# Patient Record
Sex: Female | Born: 1935 | Race: White | Hispanic: No | Smoking: Former smoker
Health system: Southern US, Community
[De-identification: ages and names within clinical notes are randomized; demographics above are authoritative.]

## PROBLEM LIST (undated history)

## (undated) DIAGNOSIS — J449 Chronic obstructive pulmonary disease, unspecified: Secondary | ICD-10-CM

## (undated) DIAGNOSIS — Z954 Presence of other heart-valve replacement: Secondary | ICD-10-CM

## (undated) DIAGNOSIS — E559 Vitamin D deficiency, unspecified: Secondary | ICD-10-CM

## (undated) DIAGNOSIS — Z9981 Dependence on supplemental oxygen: Secondary | ICD-10-CM

## (undated) DIAGNOSIS — I6529 Occlusion and stenosis of unspecified carotid artery: Secondary | ICD-10-CM

## (undated) DIAGNOSIS — H269 Unspecified cataract: Secondary | ICD-10-CM

## (undated) DIAGNOSIS — I10 Essential (primary) hypertension: Secondary | ICD-10-CM

## (undated) DIAGNOSIS — D649 Anemia, unspecified: Secondary | ICD-10-CM

## (undated) DIAGNOSIS — E7800 Pure hypercholesterolemia, unspecified: Secondary | ICD-10-CM

## (undated) DIAGNOSIS — I447 Left bundle-branch block, unspecified: Secondary | ICD-10-CM

## (undated) DIAGNOSIS — M1990 Unspecified osteoarthritis, unspecified site: Secondary | ICD-10-CM

## (undated) DIAGNOSIS — Z951 Presence of aortocoronary bypass graft: Secondary | ICD-10-CM

## (undated) DIAGNOSIS — G609 Hereditary and idiopathic neuropathy, unspecified: Secondary | ICD-10-CM

## (undated) DIAGNOSIS — G454 Transient global amnesia: Secondary | ICD-10-CM

## (undated) DIAGNOSIS — I5042 Chronic combined systolic (congestive) and diastolic (congestive) heart failure: Secondary | ICD-10-CM

## (undated) DIAGNOSIS — H3530 Unspecified macular degeneration: Secondary | ICD-10-CM

## (undated) DIAGNOSIS — I639 Cerebral infarction, unspecified: Secondary | ICD-10-CM

## (undated) DIAGNOSIS — Z5189 Encounter for other specified aftercare: Secondary | ICD-10-CM

## (undated) DIAGNOSIS — F419 Anxiety disorder, unspecified: Secondary | ICD-10-CM

## (undated) DIAGNOSIS — I2510 Atherosclerotic heart disease of native coronary artery without angina pectoris: Secondary | ICD-10-CM

## (undated) HISTORY — DX: Unspecified cataract: H26.9

## (undated) HISTORY — PX: CORONARY ARTERY BYPASS GRAFT: SHX141

## (undated) HISTORY — PX: APPENDECTOMY: SHX54

## (undated) HISTORY — DX: Unspecified macular degeneration: H35.30

## (undated) HISTORY — DX: Anxiety disorder, unspecified: F41.9

## (undated) HISTORY — DX: Unspecified osteoarthritis, unspecified site: M19.90

## (undated) HISTORY — DX: Encounter for other specified aftercare: Z51.89

## (undated) HISTORY — DX: Cerebral infarction, unspecified: I63.9

## (undated) HISTORY — DX: Essential (primary) hypertension: I10

## (undated) HISTORY — DX: Occlusion and stenosis of unspecified carotid artery: I65.29

## (undated) HISTORY — PX: AORTIC VALVE REPLACEMENT: SHX41

## (undated) HISTORY — DX: Presence of other heart-valve replacement: Z95.4

## (undated) HISTORY — DX: Left bundle-branch block, unspecified: I44.7

## (undated) HISTORY — DX: Anemia, unspecified: D64.9

## (undated) HISTORY — DX: Pure hypercholesterolemia, unspecified: E78.00

## (undated) HISTORY — DX: Presence of aortocoronary bypass graft: Z95.1

## (undated) HISTORY — DX: Atherosclerotic heart disease of native coronary artery without angina pectoris: I25.10

## (undated) HISTORY — DX: Transient global amnesia: G45.4

## (undated) HISTORY — DX: Vitamin D deficiency, unspecified: E55.9

## (undated) HISTORY — PX: TONSILLECTOMY: SUR1361

## (undated) HISTORY — DX: Hereditary and idiopathic neuropathy, unspecified: G60.9

## (undated) NOTE — *Deleted (*Deleted)
D/C home with dtr, d/c instructions provided. Questions were answered. Very appreciative of the care. Left all belongings.

---

## 2001-05-16 ENCOUNTER — Ambulatory Visit (HOSPITAL_COMMUNITY): Admission: RE | Admit: 2001-05-16 | Discharge: 2001-05-16 | Payer: Self-pay | Admitting: Cardiology

## 2001-10-20 ENCOUNTER — Ambulatory Visit (HOSPITAL_COMMUNITY): Admission: RE | Admit: 2001-10-20 | Discharge: 2001-10-20 | Payer: Self-pay | Admitting: Pulmonary Disease

## 2001-11-28 ENCOUNTER — Ambulatory Visit (HOSPITAL_COMMUNITY): Admission: RE | Admit: 2001-11-28 | Discharge: 2001-11-28 | Payer: Self-pay | Admitting: Ophthalmology

## 2002-01-23 ENCOUNTER — Ambulatory Visit (HOSPITAL_COMMUNITY): Admission: RE | Admit: 2002-01-23 | Discharge: 2002-01-23 | Payer: Self-pay | Admitting: Ophthalmology

## 2002-01-29 ENCOUNTER — Other Ambulatory Visit: Admission: RE | Admit: 2002-01-29 | Discharge: 2002-01-29 | Payer: Self-pay | Admitting: *Deleted

## 2002-05-08 ENCOUNTER — Ambulatory Visit (HOSPITAL_COMMUNITY): Admission: RE | Admit: 2002-05-08 | Discharge: 2002-05-08 | Payer: Self-pay | Admitting: Cardiology

## 2002-06-14 ENCOUNTER — Ambulatory Visit (HOSPITAL_COMMUNITY): Admission: RE | Admit: 2002-06-14 | Discharge: 2002-06-14 | Payer: Self-pay | Admitting: Cardiology

## 2002-10-30 ENCOUNTER — Ambulatory Visit (HOSPITAL_COMMUNITY): Admission: RE | Admit: 2002-10-30 | Discharge: 2002-10-30 | Payer: Self-pay | Admitting: Pulmonary Disease

## 2003-02-01 ENCOUNTER — Other Ambulatory Visit: Admission: RE | Admit: 2003-02-01 | Discharge: 2003-02-01 | Payer: Self-pay | Admitting: *Deleted

## 2003-06-07 ENCOUNTER — Ambulatory Visit (HOSPITAL_COMMUNITY): Admission: RE | Admit: 2003-06-07 | Discharge: 2003-06-07 | Payer: Self-pay | Admitting: Cardiology

## 2003-11-01 ENCOUNTER — Ambulatory Visit (HOSPITAL_COMMUNITY): Admission: RE | Admit: 2003-11-01 | Discharge: 2003-11-01 | Payer: Self-pay | Admitting: Pulmonary Disease

## 2004-09-21 ENCOUNTER — Ambulatory Visit: Payer: Self-pay | Admitting: *Deleted

## 2004-10-13 ENCOUNTER — Ambulatory Visit: Payer: Self-pay | Admitting: *Deleted

## 2004-11-03 ENCOUNTER — Ambulatory Visit: Payer: Self-pay | Admitting: Cardiology

## 2004-11-24 ENCOUNTER — Ambulatory Visit (HOSPITAL_COMMUNITY): Admission: RE | Admit: 2004-11-24 | Discharge: 2004-11-24 | Payer: Self-pay | Admitting: Pulmonary Disease

## 2004-12-02 ENCOUNTER — Ambulatory Visit: Payer: Self-pay | Admitting: Cardiology

## 2004-12-09 ENCOUNTER — Ambulatory Visit: Payer: Self-pay | Admitting: Cardiology

## 2004-12-15 ENCOUNTER — Ambulatory Visit: Payer: Self-pay | Admitting: Internal Medicine

## 2004-12-29 ENCOUNTER — Ambulatory Visit: Payer: Self-pay | Admitting: Cardiology

## 2005-01-05 ENCOUNTER — Ambulatory Visit: Payer: Self-pay | Admitting: *Deleted

## 2005-01-26 ENCOUNTER — Ambulatory Visit: Payer: Self-pay | Admitting: Cardiology

## 2005-02-02 ENCOUNTER — Ambulatory Visit: Payer: Self-pay | Admitting: Cardiology

## 2005-02-09 ENCOUNTER — Ambulatory Visit: Payer: Self-pay | Admitting: Cardiology

## 2005-02-23 ENCOUNTER — Ambulatory Visit: Payer: Self-pay | Admitting: Cardiology

## 2005-03-09 ENCOUNTER — Ambulatory Visit: Payer: Self-pay | Admitting: *Deleted

## 2005-03-10 ENCOUNTER — Other Ambulatory Visit: Admission: RE | Admit: 2005-03-10 | Discharge: 2005-03-10 | Payer: Self-pay | Admitting: *Deleted

## 2005-03-30 ENCOUNTER — Ambulatory Visit: Payer: Self-pay | Admitting: Cardiology

## 2005-04-06 ENCOUNTER — Ambulatory Visit: Payer: Self-pay | Admitting: Cardiology

## 2005-04-14 ENCOUNTER — Ambulatory Visit: Payer: Self-pay | Admitting: Cardiology

## 2005-05-04 ENCOUNTER — Ambulatory Visit: Payer: Self-pay | Admitting: Cardiology

## 2005-05-19 ENCOUNTER — Ambulatory Visit: Payer: Self-pay

## 2005-06-01 ENCOUNTER — Ambulatory Visit: Payer: Self-pay | Admitting: Cardiology

## 2005-06-15 ENCOUNTER — Ambulatory Visit: Payer: Self-pay | Admitting: Internal Medicine

## 2005-06-29 ENCOUNTER — Ambulatory Visit: Payer: Self-pay | Admitting: Cardiology

## 2005-07-13 ENCOUNTER — Ambulatory Visit: Payer: Self-pay | Admitting: Cardiology

## 2005-08-03 ENCOUNTER — Ambulatory Visit: Payer: Self-pay | Admitting: Cardiology

## 2005-08-24 ENCOUNTER — Ambulatory Visit: Payer: Self-pay | Admitting: Cardiology

## 2005-09-14 ENCOUNTER — Ambulatory Visit: Payer: Self-pay | Admitting: Cardiology

## 2005-10-12 ENCOUNTER — Ambulatory Visit: Payer: Self-pay | Admitting: Cardiology

## 2005-10-19 ENCOUNTER — Ambulatory Visit: Payer: Self-pay | Admitting: Cardiology

## 2005-11-16 ENCOUNTER — Ambulatory Visit: Payer: Self-pay | Admitting: Cardiology

## 2005-12-14 ENCOUNTER — Ambulatory Visit: Payer: Self-pay | Admitting: Cardiology

## 2005-12-20 ENCOUNTER — Ambulatory Visit (HOSPITAL_COMMUNITY): Admission: RE | Admit: 2005-12-20 | Discharge: 2005-12-20 | Payer: Self-pay | Admitting: Pulmonary Disease

## 2006-01-11 ENCOUNTER — Ambulatory Visit: Payer: Self-pay | Admitting: *Deleted

## 2006-01-18 ENCOUNTER — Ambulatory Visit: Payer: Self-pay | Admitting: *Deleted

## 2006-01-21 ENCOUNTER — Ambulatory Visit (HOSPITAL_COMMUNITY): Payer: Self-pay | Admitting: Orthopaedic Surgery

## 2006-01-21 ENCOUNTER — Encounter (HOSPITAL_COMMUNITY): Admission: RE | Admit: 2006-01-21 | Discharge: 2006-02-20 | Payer: Self-pay | Admitting: Orthopaedic Surgery

## 2006-01-24 ENCOUNTER — Ambulatory Visit: Payer: Self-pay | Admitting: Cardiology

## 2006-02-07 ENCOUNTER — Ambulatory Visit: Payer: Self-pay | Admitting: Internal Medicine

## 2006-02-16 ENCOUNTER — Ambulatory Visit: Payer: Self-pay

## 2006-03-02 ENCOUNTER — Ambulatory Visit: Payer: Self-pay | Admitting: *Deleted

## 2006-03-23 ENCOUNTER — Ambulatory Visit: Payer: Self-pay | Admitting: Cardiology

## 2006-04-05 ENCOUNTER — Ambulatory Visit: Payer: Self-pay | Admitting: *Deleted

## 2006-04-19 ENCOUNTER — Ambulatory Visit: Payer: Self-pay | Admitting: Internal Medicine

## 2006-05-04 ENCOUNTER — Ambulatory Visit: Payer: Self-pay | Admitting: *Deleted

## 2006-05-19 ENCOUNTER — Ambulatory Visit: Payer: Self-pay | Admitting: Cardiology

## 2006-05-25 ENCOUNTER — Ambulatory Visit: Payer: Self-pay | Admitting: Internal Medicine

## 2006-05-25 ENCOUNTER — Ambulatory Visit: Payer: Self-pay | Admitting: Cardiology

## 2006-06-08 ENCOUNTER — Ambulatory Visit: Payer: Self-pay | Admitting: Cardiology

## 2006-06-15 ENCOUNTER — Ambulatory Visit: Payer: Self-pay

## 2006-06-15 ENCOUNTER — Encounter: Payer: Self-pay | Admitting: Cardiology

## 2006-07-06 ENCOUNTER — Ambulatory Visit: Payer: Self-pay | Admitting: Cardiology

## 2006-08-02 ENCOUNTER — Ambulatory Visit: Payer: Self-pay | Admitting: Cardiology

## 2006-08-24 ENCOUNTER — Emergency Department (HOSPITAL_COMMUNITY): Admission: EM | Admit: 2006-08-24 | Discharge: 2006-08-24 | Payer: Self-pay | Admitting: Emergency Medicine

## 2006-08-30 ENCOUNTER — Ambulatory Visit: Payer: Self-pay | Admitting: Cardiology

## 2006-09-13 ENCOUNTER — Ambulatory Visit: Payer: Self-pay | Admitting: Cardiology

## 2006-10-04 ENCOUNTER — Ambulatory Visit: Payer: Self-pay | Admitting: Cardiovascular Disease

## 2006-11-01 ENCOUNTER — Ambulatory Visit: Payer: Self-pay | Admitting: Cardiology

## 2006-11-29 ENCOUNTER — Ambulatory Visit: Payer: Self-pay | Admitting: Cardiology

## 2006-12-13 ENCOUNTER — Ambulatory Visit: Payer: Self-pay | Admitting: Cardiology

## 2006-12-26 ENCOUNTER — Ambulatory Visit (HOSPITAL_COMMUNITY): Admission: RE | Admit: 2006-12-26 | Discharge: 2006-12-26 | Payer: Self-pay | Admitting: Pulmonary Disease

## 2007-01-03 ENCOUNTER — Ambulatory Visit: Payer: Self-pay | Admitting: Cardiology

## 2007-01-17 ENCOUNTER — Ambulatory Visit: Payer: Self-pay | Admitting: Cardiology

## 2007-02-14 ENCOUNTER — Ambulatory Visit: Payer: Self-pay | Admitting: *Deleted

## 2007-02-28 ENCOUNTER — Ambulatory Visit: Payer: Self-pay | Admitting: *Deleted

## 2007-03-07 ENCOUNTER — Ambulatory Visit: Payer: Self-pay | Admitting: Cardiology

## 2007-03-21 ENCOUNTER — Ambulatory Visit: Payer: Self-pay | Admitting: Internal Medicine

## 2007-04-04 ENCOUNTER — Ambulatory Visit: Payer: Self-pay | Admitting: Cardiology

## 2007-04-18 ENCOUNTER — Ambulatory Visit: Payer: Self-pay | Admitting: Cardiology

## 2007-05-09 ENCOUNTER — Ambulatory Visit: Payer: Self-pay | Admitting: Cardiovascular Disease

## 2007-05-23 ENCOUNTER — Ambulatory Visit: Payer: Self-pay | Admitting: Cardiology

## 2007-06-14 ENCOUNTER — Ambulatory Visit: Payer: Self-pay | Admitting: Cardiology

## 2007-06-14 ENCOUNTER — Ambulatory Visit: Payer: Self-pay

## 2007-06-14 LAB — CONVERTED CEMR LAB
ALT: 19 units/L (ref 0–35)
Alkaline Phosphatase: 81 units/L (ref 39–117)
Basophils Absolute: 0.1 10*3/uL (ref 0.0–0.1)
Basophils Relative: 0.7 % (ref 0.0–1.0)
CO2: 27 meq/L (ref 19–32)
Calcium: 9.5 mg/dL (ref 8.4–10.5)
Chloride: 108 meq/L (ref 96–112)
Glucose, Bld: 93 mg/dL (ref 70–99)
HCT: 34.3 % — ABNORMAL LOW (ref 36.0–46.0)
HDL: 38.8 mg/dL — ABNORMAL LOW (ref >39.0)
Hemoglobin: 11.9 g/dL — ABNORMAL LOW (ref 12.0–15.0)
Monocytes Absolute: 0.9 10*3/uL — ABNORMAL HIGH (ref 0.2–0.7)
Neutro Abs: 5.1 10*3/uL (ref 1.4–7.7)
Platelets: 283 10*3/uL (ref 150–400)
Potassium: 4 meq/L (ref 3.5–5.1)
RBC: 3.88 M/uL (ref 3.87–5.11)
RDW: 12.7 % (ref 11.5–14.6)
Total Protein: 6.7 g/dL (ref 6.0–8.3)
WBC: 8.4 10*3/uL (ref 4.5–10.5)

## 2007-06-21 ENCOUNTER — Ambulatory Visit: Payer: Self-pay | Admitting: Cardiology

## 2007-07-04 ENCOUNTER — Ambulatory Visit: Payer: Self-pay | Admitting: Cardiology

## 2007-07-25 ENCOUNTER — Ambulatory Visit: Payer: Self-pay | Admitting: Cardiology

## 2007-08-22 ENCOUNTER — Ambulatory Visit: Payer: Self-pay | Admitting: Cardiology

## 2007-09-19 ENCOUNTER — Ambulatory Visit: Payer: Self-pay | Admitting: Cardiology

## 2007-10-17 ENCOUNTER — Ambulatory Visit: Payer: Self-pay | Admitting: Cardiology

## 2007-10-31 ENCOUNTER — Ambulatory Visit: Payer: Self-pay | Admitting: Cardiology

## 2007-11-21 ENCOUNTER — Ambulatory Visit: Payer: Self-pay | Admitting: Cardiology

## 2007-11-30 ENCOUNTER — Ambulatory Visit: Payer: Self-pay | Admitting: Internal Medicine

## 2007-12-26 ENCOUNTER — Ambulatory Visit: Payer: Self-pay | Admitting: Cardiovascular Disease

## 2008-01-19 ENCOUNTER — Ambulatory Visit: Payer: Self-pay | Admitting: Cardiology

## 2008-01-29 ENCOUNTER — Emergency Department (HOSPITAL_COMMUNITY): Admission: EM | Admit: 2008-01-29 | Discharge: 2008-01-29 | Payer: Self-pay | Admitting: Emergency Medicine

## 2008-02-02 ENCOUNTER — Ambulatory Visit: Payer: Self-pay | Admitting: Cardiology

## 2008-02-06 ENCOUNTER — Ambulatory Visit: Payer: Self-pay | Admitting: Cardiology

## 2008-02-22 ENCOUNTER — Ambulatory Visit: Payer: Self-pay | Admitting: Internal Medicine

## 2008-03-11 ENCOUNTER — Ambulatory Visit: Payer: Self-pay | Admitting: Cardiovascular Disease

## 2008-03-22 ENCOUNTER — Ambulatory Visit: Payer: Self-pay | Admitting: Cardiology

## 2008-03-22 LAB — CONVERTED CEMR LAB
ALT: 16 units/L (ref 0–35)
BUN: 18 mg/dL (ref 6–23)
Bilirubin, Direct: 0.1 mg/dL (ref 0.0–0.3)
CO2: 30 meq/L (ref 19–32)
Calcium: 9.3 mg/dL (ref 8.4–10.5)
Chloride: 109 meq/L (ref 96–112)
Cholesterol: 147 mg/dL (ref 0–200)
GFR calc non Af Amer: 51 mL/min
Total Bilirubin: 0.8 mg/dL (ref 0.3–1.2)
Triglycerides: 68 mg/dL (ref 0–149)

## 2008-04-10 ENCOUNTER — Ambulatory Visit: Payer: Self-pay | Admitting: Cardiology

## 2008-04-10 LAB — CONVERTED CEMR LAB
Crystals: NEGATIVE
Ketones, ur: NEGATIVE mg/dL
Total Protein, Urine: NEGATIVE mg/dL
pH: 5.5 (ref 5.0–8.0)

## 2008-04-17 ENCOUNTER — Ambulatory Visit: Payer: Self-pay

## 2008-04-17 ENCOUNTER — Encounter: Payer: Self-pay | Admitting: Cardiology

## 2008-05-01 ENCOUNTER — Ambulatory Visit: Payer: Self-pay | Admitting: Internal Medicine

## 2008-05-29 ENCOUNTER — Ambulatory Visit: Payer: Self-pay | Admitting: Cardiovascular Disease

## 2008-05-29 ENCOUNTER — Ambulatory Visit: Payer: Self-pay

## 2008-06-26 ENCOUNTER — Ambulatory Visit: Payer: Self-pay | Admitting: Cardiovascular Disease

## 2008-07-05 ENCOUNTER — Ambulatory Visit: Payer: Self-pay | Admitting: Internal Medicine

## 2008-07-11 ENCOUNTER — Ambulatory Visit: Payer: Self-pay | Admitting: Cardiology

## 2008-07-19 ENCOUNTER — Ambulatory Visit: Payer: Self-pay | Admitting: Cardiovascular Disease

## 2008-08-02 ENCOUNTER — Ambulatory Visit: Payer: Self-pay | Admitting: Cardiology

## 2008-08-23 ENCOUNTER — Ambulatory Visit: Payer: Self-pay | Admitting: Internal Medicine

## 2008-09-13 ENCOUNTER — Ambulatory Visit: Payer: Self-pay | Admitting: Internal Medicine

## 2008-09-30 ENCOUNTER — Ambulatory Visit: Payer: Self-pay | Admitting: Cardiology

## 2008-10-18 ENCOUNTER — Ambulatory Visit: Payer: Self-pay | Admitting: Internal Medicine

## 2008-11-04 ENCOUNTER — Ambulatory Visit: Payer: Self-pay | Admitting: Cardiology

## 2008-11-25 ENCOUNTER — Ambulatory Visit: Payer: Self-pay | Admitting: Cardiology

## 2008-12-18 ENCOUNTER — Ambulatory Visit: Payer: Self-pay | Admitting: Cardiology

## 2008-12-27 ENCOUNTER — Encounter: Admission: RE | Admit: 2008-12-27 | Discharge: 2008-12-27 | Payer: Self-pay | Admitting: Neurology

## 2009-01-01 ENCOUNTER — Ambulatory Visit: Payer: Self-pay | Admitting: Cardiology

## 2009-01-22 ENCOUNTER — Ambulatory Visit: Payer: Self-pay | Admitting: Cardiovascular Disease

## 2009-01-28 ENCOUNTER — Ambulatory Visit: Payer: Self-pay | Admitting: Internal Medicine

## 2009-02-12 ENCOUNTER — Ambulatory Visit: Payer: Self-pay | Admitting: Cardiology

## 2009-02-12 LAB — CONVERTED CEMR LAB
BUN: 17 mg/dL (ref 6–23)
Calcium: 9.5 mg/dL (ref 8.4–10.5)
Chloride: 107 meq/L (ref 96–112)
Creatinine, Ser: 1 mg/dL (ref 0.4–1.2)
Potassium: 5.1 meq/L (ref 3.5–5.1)

## 2009-03-05 ENCOUNTER — Ambulatory Visit: Payer: Self-pay | Admitting: Internal Medicine

## 2009-03-05 ENCOUNTER — Ambulatory Visit: Payer: Self-pay | Admitting: Cardiology

## 2009-03-05 LAB — CONVERTED CEMR LAB
Bilirubin, Direct: 0.1 mg/dL (ref 0.0–0.3)
Total CHOL/HDL Ratio: 3
Triglycerides: 91 mg/dL (ref 0.0–149.0)

## 2009-03-06 DIAGNOSIS — I6350 Cerebral infarction due to unspecified occlusion or stenosis of unspecified cerebral artery: Secondary | ICD-10-CM | POA: Insufficient documentation

## 2009-03-06 DIAGNOSIS — I447 Left bundle-branch block, unspecified: Secondary | ICD-10-CM

## 2009-03-06 DIAGNOSIS — E785 Hyperlipidemia, unspecified: Secondary | ICD-10-CM | POA: Insufficient documentation

## 2009-03-06 DIAGNOSIS — I10 Essential (primary) hypertension: Secondary | ICD-10-CM

## 2009-03-06 DIAGNOSIS — I25810 Atherosclerosis of coronary artery bypass graft(s) without angina pectoris: Secondary | ICD-10-CM | POA: Insufficient documentation

## 2009-03-17 ENCOUNTER — Ambulatory Visit: Payer: Self-pay | Admitting: Cardiology

## 2009-04-07 ENCOUNTER — Ambulatory Visit: Payer: Self-pay | Admitting: Cardiovascular Disease

## 2009-04-15 ENCOUNTER — Encounter: Payer: Self-pay | Admitting: *Deleted

## 2009-05-01 ENCOUNTER — Ambulatory Visit: Payer: Self-pay | Admitting: Internal Medicine

## 2009-05-21 ENCOUNTER — Encounter: Payer: Self-pay | Admitting: *Deleted

## 2009-05-29 ENCOUNTER — Ambulatory Visit: Payer: Self-pay | Admitting: Cardiology

## 2009-05-29 ENCOUNTER — Ambulatory Visit: Payer: Self-pay | Admitting: Internal Medicine

## 2009-05-29 LAB — CONVERTED CEMR LAB: POC INR: 3

## 2009-06-26 ENCOUNTER — Encounter: Payer: Self-pay | Admitting: Cardiology

## 2009-06-26 ENCOUNTER — Ambulatory Visit: Payer: Self-pay | Admitting: Internal Medicine

## 2009-07-24 ENCOUNTER — Ambulatory Visit: Payer: Self-pay | Admitting: Cardiovascular Disease

## 2009-07-24 LAB — CONVERTED CEMR LAB: POC INR: 3.3

## 2009-08-11 ENCOUNTER — Ambulatory Visit: Payer: Self-pay | Admitting: Cardiology

## 2009-08-11 LAB — CONVERTED CEMR LAB: POC INR: 2.2

## 2009-08-25 ENCOUNTER — Ambulatory Visit: Payer: Self-pay | Admitting: Internal Medicine

## 2009-08-25 LAB — CONVERTED CEMR LAB: POC INR: 3

## 2009-09-15 ENCOUNTER — Ambulatory Visit: Payer: Self-pay | Admitting: Cardiology

## 2009-09-15 LAB — CONVERTED CEMR LAB: POC INR: 2.4

## 2009-10-03 ENCOUNTER — Ambulatory Visit: Payer: Self-pay | Admitting: Cardiology

## 2009-10-13 ENCOUNTER — Ambulatory Visit: Payer: Self-pay | Admitting: Cardiology

## 2009-10-13 DIAGNOSIS — I6529 Occlusion and stenosis of unspecified carotid artery: Secondary | ICD-10-CM

## 2009-10-13 DIAGNOSIS — Z952 Presence of prosthetic heart valve: Secondary | ICD-10-CM

## 2009-11-03 ENCOUNTER — Ambulatory Visit: Payer: Self-pay | Admitting: Cardiology

## 2009-12-01 ENCOUNTER — Telehealth: Payer: Self-pay | Admitting: Cardiology

## 2009-12-01 ENCOUNTER — Ambulatory Visit: Payer: Self-pay | Admitting: Cardiology

## 2009-12-01 LAB — CONVERTED CEMR LAB: POC INR: 3.5

## 2009-12-02 ENCOUNTER — Encounter: Payer: Self-pay | Admitting: Cardiology

## 2009-12-04 ENCOUNTER — Telehealth: Payer: Self-pay | Admitting: Cardiology

## 2009-12-05 ENCOUNTER — Telehealth: Payer: Self-pay | Admitting: Cardiology

## 2009-12-22 ENCOUNTER — Ambulatory Visit: Payer: Self-pay | Admitting: Cardiovascular Disease

## 2009-12-22 LAB — CONVERTED CEMR LAB: POC INR: 2

## 2010-01-12 ENCOUNTER — Ambulatory Visit: Payer: Self-pay | Admitting: Cardiology

## 2010-01-12 ENCOUNTER — Telehealth: Payer: Self-pay | Admitting: Cardiology

## 2010-02-03 ENCOUNTER — Encounter: Payer: Self-pay | Admitting: Cardiology

## 2010-02-09 ENCOUNTER — Ambulatory Visit: Payer: Self-pay | Admitting: Cardiovascular Disease

## 2010-02-09 ENCOUNTER — Ambulatory Visit: Payer: Self-pay | Admitting: Cardiology

## 2010-02-09 LAB — CONVERTED CEMR LAB: POC INR: 2.8

## 2010-02-11 ENCOUNTER — Telehealth: Payer: Self-pay | Admitting: Cardiology

## 2010-02-11 LAB — CONVERTED CEMR LAB
AST: 32 units/L (ref 0–37)
Albumin: 3.9 g/dL (ref 3.5–5.2)
Alkaline Phosphatase: 75 units/L (ref 39–117)
LDL Cholesterol: 93 mg/dL (ref 0–99)
Total Protein: 7.2 g/dL (ref 6.0–8.3)

## 2010-03-09 ENCOUNTER — Ambulatory Visit: Payer: Self-pay | Admitting: Cardiovascular Disease

## 2010-03-16 ENCOUNTER — Ambulatory Visit: Payer: Self-pay | Admitting: Cardiology

## 2010-03-24 ENCOUNTER — Ambulatory Visit (HOSPITAL_COMMUNITY): Admission: RE | Admit: 2010-03-24 | Discharge: 2010-03-24 | Payer: Self-pay | Admitting: Pulmonary Disease

## 2010-04-01 ENCOUNTER — Encounter: Admission: RE | Admit: 2010-04-01 | Discharge: 2010-04-01 | Payer: Self-pay | Admitting: Pulmonary Disease

## 2010-04-03 ENCOUNTER — Ambulatory Visit: Payer: Self-pay | Admitting: Internal Medicine

## 2010-04-06 ENCOUNTER — Ambulatory Visit: Payer: Self-pay

## 2010-04-06 ENCOUNTER — Encounter: Payer: Self-pay | Admitting: Cardiology

## 2010-04-06 ENCOUNTER — Ambulatory Visit (HOSPITAL_COMMUNITY): Admission: RE | Admit: 2010-04-06 | Discharge: 2010-04-06 | Payer: Self-pay | Admitting: Cardiology

## 2010-04-06 ENCOUNTER — Ambulatory Visit: Payer: Self-pay | Admitting: Cardiology

## 2010-04-14 ENCOUNTER — Telehealth: Payer: Self-pay | Admitting: Cardiology

## 2010-04-15 ENCOUNTER — Encounter: Payer: Self-pay | Admitting: Cardiology

## 2010-04-16 ENCOUNTER — Encounter: Payer: Self-pay | Admitting: Cardiology

## 2010-04-16 ENCOUNTER — Telehealth: Payer: Self-pay | Admitting: Cardiology

## 2010-04-17 ENCOUNTER — Ambulatory Visit: Payer: Self-pay | Admitting: Cardiology

## 2010-04-20 ENCOUNTER — Encounter: Payer: Self-pay | Admitting: Cardiology

## 2010-04-20 LAB — CONVERTED CEMR LAB
BUN: 19 mg/dL (ref 6–23)
Basophils Absolute: 0 10*3/uL (ref 0.0–0.1)
Calcium: 9.3 mg/dL (ref 8.4–10.5)
Creatinine, Ser: 0.9 mg/dL (ref 0.4–1.2)
GFR calc non Af Amer: 62.2 mL/min (ref >60)
Glucose, Bld: 86 mg/dL (ref 70–99)
HCT: 34.1 % — ABNORMAL LOW (ref 36.0–46.0)
Lymphocytes Relative: 22.7 % (ref 12.0–46.0)
Lymphs Abs: 1.7 10*3/uL (ref 0.7–4.0)
MCV: 93 fL (ref 78.0–100.0)
Monocytes Absolute: 0.6 10*3/uL (ref 0.1–1.0)
Monocytes Relative: 8.4 % (ref 3.0–12.0)
Neutrophils Relative %: 66.8 % (ref 43.0–77.0)
Prothrombin Time: 20.9 s — ABNORMAL HIGH (ref 9.1–11.7)
RBC: 3.66 M/uL — ABNORMAL LOW (ref 3.87–5.11)
WBC: 7.5 10*3/uL (ref 4.5–10.5)
aPTT: 34.8 s — ABNORMAL HIGH (ref 21.7–28.8)

## 2010-04-21 ENCOUNTER — Ambulatory Visit (HOSPITAL_COMMUNITY): Admission: RE | Admit: 2010-04-21 | Discharge: 2010-04-21 | Payer: Self-pay | Admitting: Cardiology

## 2010-04-21 ENCOUNTER — Ambulatory Visit: Payer: Self-pay | Admitting: Cardiology

## 2010-05-01 ENCOUNTER — Ambulatory Visit: Payer: Self-pay | Admitting: Cardiology

## 2010-05-01 DIAGNOSIS — E7800 Pure hypercholesterolemia, unspecified: Secondary | ICD-10-CM

## 2010-05-05 LAB — CONVERTED CEMR LAB
AST: 28 units/L (ref 0–37)
Albumin: 3.9 g/dL (ref 3.5–5.2)
Cholesterol: 167 mg/dL (ref 0–200)
HDL: 60 mg/dL (ref >39.00)
LDL Cholesterol: 86 mg/dL (ref 0–99)

## 2010-05-27 ENCOUNTER — Ambulatory Visit: Payer: Self-pay | Admitting: Internal Medicine

## 2010-05-27 ENCOUNTER — Ambulatory Visit: Payer: Self-pay | Admitting: Cardiology

## 2010-06-16 ENCOUNTER — Ambulatory Visit: Payer: Self-pay | Admitting: Cardiology

## 2010-06-16 LAB — CONVERTED CEMR LAB
AST: 27 units/L (ref 0–37)
Alkaline Phosphatase: 85 units/L (ref 39–117)
Bilirubin, Direct: 0.1 mg/dL (ref 0.0–0.3)
Cholesterol: 146 mg/dL (ref 0–200)
HDL: 54.2 mg/dL (ref >39.00)
POC INR: 2.4
Total Bilirubin: 0.7 mg/dL (ref 0.3–1.2)
Total CHOL/HDL Ratio: 3
Total Protein: 6.7 g/dL (ref 6.0–8.3)

## 2010-07-14 ENCOUNTER — Ambulatory Visit: Payer: Self-pay | Admitting: Cardiology

## 2010-07-14 LAB — CONVERTED CEMR LAB: POC INR: 2.3

## 2010-08-11 ENCOUNTER — Ambulatory Visit: Payer: Self-pay | Admitting: Cardiology

## 2010-09-08 ENCOUNTER — Ambulatory Visit: Payer: Self-pay | Admitting: Cardiovascular Disease

## 2010-09-14 ENCOUNTER — Encounter: Payer: Self-pay | Admitting: Cardiology

## 2010-09-23 ENCOUNTER — Ambulatory Visit: Payer: Self-pay | Admitting: Cardiovascular Disease

## 2010-10-14 ENCOUNTER — Ambulatory Visit: Payer: Self-pay | Admitting: Internal Medicine

## 2010-11-11 ENCOUNTER — Ambulatory Visit: Payer: Self-pay | Admitting: Cardiology

## 2010-11-11 LAB — CONVERTED CEMR LAB: POC INR: 2.4

## 2010-11-25 ENCOUNTER — Ambulatory Visit
Admission: RE | Admit: 2010-11-25 | Discharge: 2010-11-25 | Payer: Self-pay | Source: Home / Self Care | Attending: Cardiology | Admitting: Cardiology

## 2010-12-08 ENCOUNTER — Ambulatory Visit: Admission: RE | Admit: 2010-12-08 | Discharge: 2010-12-08 | Payer: Self-pay | Source: Home / Self Care

## 2010-12-08 ENCOUNTER — Ambulatory Visit (HOSPITAL_COMMUNITY)
Admission: RE | Admit: 2010-12-08 | Discharge: 2010-12-08 | Payer: Self-pay | Source: Home / Self Care | Attending: Cardiology | Admitting: Cardiology

## 2010-12-08 LAB — CONVERTED CEMR LAB: POC INR: 1.9

## 2010-12-14 ENCOUNTER — Ambulatory Visit: Admission: RE | Admit: 2010-12-14 | Discharge: 2010-12-14 | Payer: Self-pay | Source: Home / Self Care

## 2010-12-14 ENCOUNTER — Ambulatory Visit (HOSPITAL_COMMUNITY)
Admission: RE | Admit: 2010-12-14 | Discharge: 2010-12-14 | Payer: Self-pay | Source: Home / Self Care | Attending: Cardiology | Admitting: Cardiology

## 2010-12-15 NOTE — Progress Notes (Signed)
Summary: prescriptions  Phone Note Call from Patient Call back at [phone]   Caller: Daughter Summary of Call: calling to check about  presciptions Initial call taken by: Migdalia Dk,  December 05, 2009 3:07 PM    New/Updated Medications: SIMVASTATIN 40 MG TABS (SIMVASTATIN) 1 once daily Prescriptions: SIMVASTATIN 40 MG TABS (SIMVASTATIN) 1 once daily  #30 x 11   Entered by:   Scherrie Bateman, LPN   Authorized by:   Gaylord Shih, MD, [facility]   Signed by:   Scherrie Bateman, LPN on 32/95/1884   Method used:   Electronically to        [facility] (retail)       [address]       [place], [place]  [address]       Ph: [phone]       Fax: [phone]   RxID:   [id number]

## 2010-12-15 NOTE — Medication Information (Signed)
Summary: rov/ewj  Anticoagulant Therapy  Managed by: Bethena Midget, RN, BSN Referring MD: Valera Castle MD PCP: Shaune Pollack Supervising MD: Excell Seltzer MD, Casimiro Needle Indication 1: Atrial Fibrillation (ICD-427.31) Lab Used: [facility] Site: [facility] INR POC 2.0 INR RANGE 2.3 - 3  Dietary changes: no    Health status changes: no    Bleeding/hemorrhagic complications: no    Recent/future hospitalizations: no    Any changes in medication regimen? no    Recent/future dental: no  Any missed doses?: no       Is patient compliant with meds? yes       Allergies: 1)  ! Codeine 2)  ! Penicillin  Anticoagulation Management History:      The patient is taking warfarin and comes in today for a routine follow up visit.  Positive risk factors for bleeding include an age of 75 years or older and history of CVA/TIA.  The bleeding index is 'intermediate risk'.  Positive CHADS2 values include History of HTN, Age > 66 years old, and Prior Stroke/CVA/TIA.  The start date was 02/13/2001.  Anticoagulation responsible provider: Excell Seltzer MD, Casimiro Needle.  INR POC: 2.0.  Cuvette Lot#: [id number].  Exp: 02/2011.    Anticoagulation Management Assessment/Plan:      The patient's current anticoagulation dose is Coumadin 5 mg tabs: Take by mouth as directed.  The target INR is 2.3-3.0.  The next INR is due 01/12/2010.  Anticoagulation instructions were given to patient.  Results were reviewed/authorized by Bethena Midget, RN, BSN.  She was notified by Bethena Midget, RN, BSN.         Prior Anticoagulation Instructions: INR 3.5  Skip today's dose of coumadin, then resume same dosage 1/2 tablet daily except 1 tablet on Mondays and Fridays.  Recheck in 3 weeks.    Current Anticoagulation Instructions: INR 2.0 Today take 7.5mg s then resume 2.5mg s everyday except 5mg s on Mondays and Fridays. Recheck in 3 weeks.

## 2010-12-15 NOTE — Assessment & Plan Note (Signed)
Summary: eph/jml   Visit Type:  EPH Primary Provider:  Shaune Pollack  CC:  no cardiac complaints today..pt had cath 3 wks ago.  History of Present Illness: Mrs [name] times a day after cardiac catheterization.  All of her bypass grafts are occluded including the vein graft to the LAD vein graft to the circumflex the vein graft to right coronary artery. She had nonobstructive disease otherwise. Respiratory catheterization was unremarkable.  She does have increased dyspnea on exertion but has had no angina. Her aortic valve prosthesis is stable.  She denies any symptoms of TIAs or mini strokes. She has had no bleeding complications on Coumadin.  Clinical Reports Reviewed:  Carotid Doppler:  04/06/2010:  Impressions: Stable mild carotid artery disease. 40-59% bilateral ICA stenosis.  Charlton Haws , MD  05/29/2008:  Essentially stable, mild bilateral carotid disease 0 - 39% RICA stenosis 40 - 59% LICA stenosis  02/06/2008:  Stable carotid disease, bilaterally 40 - 59% bilateral ICA stenosis  EKG:  10/13/2009: EKG Interpretation:  goals sinus rhythm, left bundle branch block pattern, no change.   Current Medications (verified): 1)  Coumadin 5 Mg Tabs (Warfarin Sodium) .... Take By Mouth As Directed 2)  Furosemide 20 Mg Tabs (Furosemide) .... Take 1 Tablet By Mouth Once A Day 3)  Lotrel 5-20 Mg Caps (Amlodipine Besy-Benazepril Hcl) .... Take 1 Capsule By Mouth Once A Day 4)  Metoprolol Tartrate 50 Mg Tabs (Metoprolol Tartrate) .... Take 1 Tablet By Mouth Twice A Day 5)  Potassium Chloride Crys Cr 10 Meq Cr-Tabs (Potassium Chloride Crys Cr) .... Take 1 Tablet By Mouth Once A Day 6)  Aspirin 81 Mg Tbec (Aspirin) .... Take One Tablet By Mouth Daily 7)  Aricept Odt 10 Mg Tbdp (Donepezil Hcl) .Marland Kitchen.. 1 Tab Once Daily 8)  Ambien 5 Mg Tabs (Zolpidem Tartrate) .Marland Kitchen.. 1 Tab At Bedtime 9)  Amitriptyline Hcl 10 Mg Tabs (Amitriptyline Hcl) .... Take 1 Tablet By Mouth At Bedtime 10)   Melatonin 3 Mg Tabs (Melatonin) .... Take 1 Tablet By Mouth At Bedtime 11)  Ocuvite  Tabs (Multiple Vitamins-Minerals) .... Take 1 Tablet By Mouth Once A Day 12)  Lipitor 40 Mg Tabs (Atorvastatin Calcium) .Marland Kitchen.. 1 Once Daily 13)  Vitamin D (Ergocalciferol) 50000 Unit Caps (Ergocalciferol) .Marland Kitchen.. 1 Tab Weekly  Allergies: 1)  ! Codeine 2)  ! Penicillin  Past History:  Past Medical History: Last updated: 05/08/2010 PURE HYPERCHOLESTEROLEMIA (ICD-272.0) ENCOUNTER FOR LONG-TERM USE OF OTHER MEDICATIONS (ICD-V58.69) CAROTID ARTERY STENOSIS, WITHOUT INFARCTION (ICD-433.10) AORTIC VALVE REPLACEMENT, HX OF (ICD-V43.3) CVA (ICD-434.91) HYPERTENSION, UNSPECIFIED (ICD-401.9) CAD, ARTERY BYPASS GRAFT (ICD-414.04) HYPERLIPIDEMIA-MIXED (ICD-272.4) LBBB (ICD-426.3)  Past Surgical History: Last updated: 05/08/2010 aortic valve replacement CABG  Social History: Last updated: 03/06/2009 Tobacco Use - No.   Risk Factors: Smoking Status: never (03/06/2009)  Review of Systems       negative other than history of present illness  Vital Signs:  Patient profile:   77 year old female Height:      66 inches Weight:      117 pounds BMI:     18.95 Pulse rate:   54 / minute Pulse rhythm:   irregular BP sitting:   108 / 44  (left arm) Cuff size:   large  Vitals Entered By: Danielle Rankin, CMA (May 27, 2010 9:56 AM)  Physical Exam  General:  Well developed, well nourished, in no acute distress. Head:  normocephalic and atraumatic Eyes:  PERRLA/EOM intact; conjunctiva and lids normal. Neck:  Neck supple,  no JVD. No masses, thyromegaly or abnormal cervical nodes. Chest Arlyn Bumpus:  no deformities or breast masses noted Lungs:  Clear bilaterally to auscultation and percussion. Heart:  PMI nondisplaced, normal S1 prosthetic S2, regular rate and rhythm Msk:  Back normal, normal gait. Muscle strength and tone normal. Pulses:  pulses normal in all 4 extremities Extremities:  No clubbing or  cyanosis. Neurologic:  Alert and oriented x 3. Skin:  Intact without lesions or rashes. Psych:  Normal affect.   Impression & Recommendations:  Problem # 1:  CAD, ARTERY BYPASS GRAFT (ICD-414.04) Assessment Unchanged  Her updated medication list for this problem includes:    Coumadin 5 Mg Tabs (Warfarin sodium) .Marland Kitchen... Take by mouth as directed    Lotrel 5-20 Mg Caps (Amlodipine besy-benazepril hcl) .Marland Kitchen... Take 1 capsule by mouth once a day    Metoprolol Tartrate 50 Mg Tabs (Metoprolol tartrate) .Marland Kitchen... Take 1 tablet by mouth twice a day    Aspirin 81 Mg Tbec (Aspirin) .Marland Kitchen... Take one tablet by mouth daily  Orders: EKG w/ Interpretation (93000)  Problem # 2:  ENCOUNTER FOR LONG-TERM USE OF OTHER MEDICATIONS (ICD-V58.69)  Problem # 3:  CAROTID ARTERY STENOSIS, WITHOUT INFARCTION (ICD-433.10) Assessment: Unchanged  Her updated medication list for this problem includes:    Coumadin 5 Mg Tabs (Warfarin sodium) .Marland Kitchen... Take by mouth as directed    Aspirin 81 Mg Tbec (Aspirin) .Marland Kitchen... Take one tablet by mouth daily  Problem # 4:  AORTIC VALVE REPLACEMENT, HX OF (ICD-V43.3) Assessment: Unchanged  Problem # 5:  HYPERTENSION, UNSPECIFIED (ICD-401.9) Assessment: Improved  Her updated medication list for this problem includes:    Furosemide 20 Mg Tabs (Furosemide) .Marland Kitchen... Take 1 tablet by mouth once a day    Lotrel 5-20 Mg Caps (Amlodipine besy-benazepril hcl) .Marland Kitchen... Take 1 capsule by mouth once a day    Metoprolol Tartrate 50 Mg Tabs (Metoprolol tartrate) .Marland Kitchen... Take 1 tablet by mouth twice a day    Aspirin 81 Mg Tbec (Aspirin) .Marland Kitchen... Take one tablet by mouth daily  Problem # 6:  CVA (ICD-434.91) Assessment: Unchanged  Her updated medication list for this problem includes:    Coumadin 5 Mg Tabs (Warfarin sodium) .Marland Kitchen... Take by mouth as directed    Aspirin 81 Mg Tbec (Aspirin) .Marland Kitchen... Take one tablet by mouth daily  Problem # 7:  PURE HYPERCHOLESTEROLEMIA (ICD-272.0)  Her updated medication list  for this problem includes:    Lipitor 40 Mg Tabs (Atorvastatin calcium) .Marland Kitchen... 1 once daily  Problem # 8:  LBBB (ICD-426.3) Assessment: Unchanged  Her updated medication list for this problem includes:    Coumadin 5 Mg Tabs (Warfarin sodium) .Marland Kitchen... Take by mouth as directed    Lotrel 5-20 Mg Caps (Amlodipine besy-benazepril hcl) .Marland Kitchen... Take 1 capsule by mouth once a day    Metoprolol Tartrate 50 Mg Tabs (Metoprolol tartrate) .Marland Kitchen... Take 1 tablet by mouth twice a day    Aspirin 81 Mg Tbec (Aspirin) .Marland Kitchen... Take one tablet by mouth daily  Patient Instructions: 1)  Your physician recommends that you schedule a follow-up appointment in: 6 months with Dr. Daleen Squibb 2)  Your physician recommends that you continue on your current medications as directed. Please refer to the Current Medication list given to you today.

## 2010-12-15 NOTE — Medication Information (Signed)
Summary: rov/cb  Anticoagulant Therapy  Managed by: Weston Brass, PharmD Referring MD: Valera Castle MD PCP: Shaune Pollack Supervising MD: Johney Frame MD, Fayrene Fearing Indication 1: Atrial Fibrillation (ICD-427.31) Lab Used: [facility] Site: [facility] INR POC 2.7 INR RANGE 2.3 - 3  Dietary changes: no    Health status changes: no    Bleeding/hemorrhagic complications: no    Recent/future hospitalizations: no    Any changes in medication regimen? no    Recent/future dental: no  Any missed doses?: no       Is patient compliant with meds? yes      Comments: pt requested to return in 3 weeks due to lab work on same day  Allergies: 1)  ! Codeine 2)  ! Penicillin  Anticoagulation Management History:      The patient is taking warfarin and comes in today for a routine follow up visit.  Positive risk factors for bleeding include an age of 75 years or older and history of CVA/TIA.  The bleeding index is 'intermediate risk'.  Positive CHADS2 values include History of HTN, Age > 75 years old, and Prior Stroke/CVA/TIA.  The start date was 02/13/2001.  Her last INR was 2.0 ratio.  Anticoagulation responsible provider: Kenyatta Keidel MD, Fayrene Fearing.  INR POC: 2.7.  Cuvette Lot#: [id number].  Exp: 07/2011.    Anticoagulation Management Assessment/Plan:      The patient's current anticoagulation dose is Coumadin 5 mg tabs: Take by mouth as directed.  The target INR is 2.3-3.0.  The next INR is due 06/16/2010.  Anticoagulation instructions were given to patient.  Results were reviewed/authorized by Weston Brass, PharmD.  She was notified by Dillard Cannon.         Prior Anticoagulation Instructions: INR 1.8. Take 1.5 tablets today, then take 0.5 tablet daily except 1 tablet on Mon and Fri. Recheck in 4 weeks.  Current Anticoagulation Instructions: INR 2.7  Continue same regimen on 1 tab on Monday and Friday and 0.5 tab on Sunday, Tuesday, Wednesday, Thursday, and Saturday.  Re-check INR in 3 weeks.

## 2010-12-15 NOTE — Medication Information (Signed)
Summary: rov/sl  Anticoagulant Therapy  Managed by: Weston Brass, PharmD Referring MD: Valera Castle MD PCP: Shaune Pollack Supervising MD: Gala Romney MD, Reuel Boom Indication 1: Atrial Fibrillation (ICD-427.31) Lab Used: [facility] Site: [facility] INR POC 2.8 INR RANGE 2.3 - 3  Dietary changes: no    Health status changes: no    Bleeding/hemorrhagic complications: no    Recent/future hospitalizations: no    Any changes in medication regimen? no    Recent/future dental: no  Any missed doses?: no       Is patient compliant with meds? yes       Allergies: 1)  ! Codeine 2)  ! Penicillin  Anticoagulation Management History:      The patient is taking warfarin and comes in today for a routine follow up visit.  Positive risk factors for bleeding include an age of 75 years or older and history of CVA/TIA.  The bleeding index is 'intermediate risk'.  Positive CHADS2 values include History of HTN, Age > 75 years old, and Prior Stroke/CVA/TIA.  The start date was 02/13/2001.  Her last INR was 2.0 ratio.  Anticoagulation responsible provider: Bensimhon MD, Reuel Boom.  INR POC: 2.8.  Cuvette Lot#: [id number].  Exp: 10/2011.    Anticoagulation Management Assessment/Plan:      The patient's current anticoagulation dose is Coumadin 5 mg tabs: Take by mouth as directed.  The target INR is 2.3-3.0.  The next INR is due 11/11/2010.  Anticoagulation instructions were given to patient/daughter.  Results were reviewed/authorized by Weston Brass, PharmD.  She was notified by Weston Brass PharmD.         Prior Anticoagulation Instructions: INR 3.1  Today, Wednesday, November 9th, do not take Coumadin. Then, resume taking Coumadin 1 tab (5 mg) on Mon and Fri and Coumadin 0.5 tab (2.5 mg) all other days.  Return to clinic in 3 weeks. The patient is to hold four doses of coumadin.  The dosage to be resumed includes:   Current Anticoagulation Instructions: INR 2.8  Continue same dose of 1/2 tablet every day  except 1 tablet on Monday and Friday.  Recheck INR in 4 weeks.

## 2010-12-15 NOTE — Medication Information (Signed)
Summary: rov/eac  Anticoagulant Therapy  Managed by: Cloyde Reams, RN, BSN Referring MD: Valera Castle MD PCP: Shaune Pollack Supervising MD: Excell Seltzer MD, Casimiro Needle Indication 1: Atrial Fibrillation (ICD-427.31) Lab Used: [facility] Site: [facility] INR POC 2.4 INR RANGE 2.3 - 3  Dietary changes: no    Health status changes: no    Bleeding/hemorrhagic complications: no    Recent/future hospitalizations: no    Any changes in medication regimen? no    Recent/future dental: no  Any missed doses?: no       Is patient compliant with meds? yes       Allergies (verified): 1)  ! Codeine 2)  ! Penicillin  Anticoagulation Management History:      The patient is taking warfarin and comes in today for a routine follow up visit.  Positive risk factors for bleeding include an age of 75 years or older and history of CVA/TIA.  The bleeding index is 'intermediate risk'.  Positive CHADS2 values include History of HTN, Age > 75 years old, and Prior Stroke/CVA/TIA.  The start date was 02/13/2001.  Anticoagulation responsible provider: Excell Seltzer MD, Casimiro Needle.  INR POC: 2.4.  Cuvette Lot#: [id number].  Exp: 04/2011.    Anticoagulation Management Assessment/Plan:      The patient's current anticoagulation dose is Coumadin 5 mg tabs: Take by mouth as directed.  The target INR is 2.3-3.0.  The next INR is due 04/06/2010.  Anticoagulation instructions were given to patient.  Results were reviewed/authorized by Cloyde Reams, RN, BSN.  She was notified by Cloyde Reams RN.         Prior Anticoagulation Instructions: INR 2.8  Continue taking 1 tablet on Monday and Friday and 1/2 tablet all other days.  Return to clinic in 4 weeks.    Current Anticoagulation Instructions: INR 2.4  Continue on same dosage 1/2 tablet  daily except 1 tablet on Mondays and Fridays.  Recheck in 4 weeks.

## 2010-12-15 NOTE — Progress Notes (Signed)
Summary: medication vyrtoin is not approved   Phone Note From Other Clinic   Caller: tiffany from [employer] [phone]  Request: Talk with Nurse Details for Reason: medication vyrtoin is not approved.  Initial call taken by: Lorne Skeens,  December 04, 2009 11:46 AM  Follow-up for Phone Call        per prev. phone note Dr Daleen Squibb said ok to change to simvastatin, they will make note in there system Meredith Staggers, RN  December 04, 2009 12:54 PM      Appended Document: medication vyrtoin is not approved  FILLED SIMVASTATIN THRU EMR PT'S DAUGHTER AWARE./CY

## 2010-12-15 NOTE — Medication Information (Signed)
Summary: rov/ewj  Anticoagulant Therapy  Managed by: Weston Brass, PharmD Referring MD: Valera Castle MD PCP: Shaune Pollack Supervising MD: Tenny Craw MD, Gunnar Fusi Indication 1: Atrial Fibrillation (ICD-427.31) Lab Used: [facility] Site: [facility] INR POC 2.3 INR RANGE 2.3 - 3  Dietary changes: no    Health status changes: no    Bleeding/hemorrhagic complications: no    Recent/future hospitalizations: no    Any changes in medication regimen? yes       Details: on prednisone taper for feet- finished last week.   Recent/future dental: no  Any missed doses?: no       Is patient compliant with meds? yes       Allergies: 1)  ! Codeine 2)  ! Penicillin  Anticoagulation Management History:      The patient is taking warfarin and comes in today for a routine follow up visit.  Positive risk factors for bleeding include an age of 77 years or older and history of CVA/TIA.  The bleeding index is 'intermediate risk'.  Positive CHADS2 values include History of HTN, Age > 72 years old, and Prior Stroke/CVA/TIA.  The start date was 02/13/2001.  Anticoagulation responsible provider: Tenny Craw MD, Gunnar Fusi.  INR POC: 2.3.  Cuvette Lot#: [id number].  Exp: 06/2011.    Anticoagulation Management Assessment/Plan:      The patient's current anticoagulation dose is Coumadin 5 mg tabs: Take by mouth as directed.  The target INR is 2.3-3.0.  The next INR is due 05/01/2010.  Anticoagulation instructions were given to patient.  Results were reviewed/authorized by Weston Brass, PharmD.  She was notified by Weston Brass PharmD.         Prior Anticoagulation Instructions: INR 2.4  Continue on same dosage 1/2 tablet  daily except 1 tablet on Mondays and Fridays.  Recheck in 4 weeks.    Current Anticoagulation Instructions: INR 2.3  Continue same dose of 1/2 tablets every day except 1 tablet on Monday and Friday

## 2010-12-15 NOTE — Medication Information (Signed)
Summary: rov/tm  Anticoagulant Therapy  Managed by: Cloyde Reams, RN, BSN Referring MD: Valera Castle MD PCP: Shaune Pollack Supervising MD: Shirlee Latch MD, Cesario Weidinger Indication 1: Atrial Fibrillation (ICD-427.31) Lab Used: [facility] Site: [facility] INR POC 2.8 INR RANGE 2.3 - 3  Dietary changes: no    Health status changes: no    Bleeding/hemorrhagic complications: no    Recent/future hospitalizations: no    Any changes in medication regimen? yes       Details: Started on a zpack yesterday.    Recent/future dental: no  Any missed doses?: no       Is patient compliant with meds? yes       Allergies (verified): 1)  ! Codeine 2)  ! Penicillin  Anticoagulation Management History:      The patient is taking warfarin and comes in today for a routine follow up visit.  Positive risk factors for bleeding include an age of 75 years or older and history of CVA/TIA.  The bleeding index is 'intermediate risk'.  Positive CHADS2 values include History of HTN, Age > 20 years old, and Prior Stroke/CVA/TIA.  The start date was 02/13/2001.  Anticoagulation responsible provider: Shirlee Latch MD, Diamone Whistler.  INR POC: 2.8.  Cuvette Lot#: [id number].  Exp: 03/2011.    Anticoagulation Management Assessment/Plan:      The patient's current anticoagulation dose is Coumadin 5 mg tabs: Take by mouth as directed.  The target INR is 2.3-3.0.  The next INR is due 02/09/2010.  Anticoagulation instructions were given to patient.  Results were reviewed/authorized by Cloyde Reams, RN, BSN.  She was notified by Cloyde Reams RN.         Prior Anticoagulation Instructions: INR 2.0 Today take 7.5mg s then resume 2.5mg s everyday except 5mg s on Mondays and Fridays. Recheck in 3 weeks.   Current Anticoagulation Instructions: INR 2.8  Continue on same dosage 1/2 tablet daily except 1 tablet on Mondays and Fridays.  Recheck in 4 weeks.

## 2010-12-15 NOTE — Letter (Signed)
Summary: [facility] Office Visit Note   [facility] Office Visit Note   Imported By: Roderic Ovens 10/01/2010 16:04:43  _____________________________________________________________________  External Attachment:    Type:   Image     Comment:   External Document

## 2010-12-15 NOTE — Progress Notes (Signed)
Summary: Pt daughter calling with questions about the pt lab work  Phone Note Call from Patient Call back at [phone]   Caller: Daughter/[name] Montez Morita Summary of Call: Pt daughter calling with questions about labs Initial call taken by: Judie Grieve,  February 11, 2010 4:13 PM  Follow-up for Phone Call        Phone Call Completed Follow-up by: Scherrie Bateman, LPN,  February 11, 2010 5:09 PM

## 2010-12-15 NOTE — Letter (Signed)
Summary: Cardiac Catheterization Instructions- Main Lab  [facility], Main Office  [address]   [place], [place] [address]   Phone: [phone]  Fax: [phone]     04/15/2010 MRN: [id number]  [name] [address] [place], [place]  [address]  Dear [name],   You are scheduled for Cardiac Catheterization on      04/21/10         with Dr.  Riley Kill          .  Please arrive at the [facility] at   7:00    a.m./p.m. on the day of your procedure.  1. DIET     _X___ Nothing to eat or drink after midnight except your medications with a sip of water.  2. Come to the [place] office on             for lab work.  The lab at [facility] is open from 8:30 a.m. to 1:30 p.m. and 2:30 p.m. to 5:00 p.m.  The lab at [facility] is open from 7:30 a.m. to 5:30 p.m.  You do not have to be fasting.  3. MAKE SURE YOU TAKE YOUR ASPIRIN.  4. __X___ DO NOT TAKE these medications before your procedure:         ______HOLD COUMADIN STARTING SAT 04/18/10 AND HOLD FUROSEMIDE AM OF CATH. __________________________________________________________________________      __X__ YOU MAY TAKE ALL of your remaining medications with a small amount of water.      ____ START NEW medications:     ________________________________________________________________________________      ____ [name] instructions:     ________________________________________________________________________________  5. Plan for one night stay - bring personal belongings (i.e. toothpaste, toothbrush, etc.)  6. Bring a current list of your medications and current insurance cards.  7. Must have a responsible person to drive you home.   8. Someone must be with yu for the first 24 hours after you arrive home.  9. Please wear clothes that are easy to get on and off and wear slip-on shoes.  *Special note: Every effort is made to have your procedure done on time.   Occasionally there are emergencies that present themselves at the hospital that may cause delays.  Please be patient if a delay does occur.  If you have any questions after you get home, please call the office at the number listed above.  DR TOM [name].Scherrie Bateman, LPN

## 2010-12-15 NOTE — Medication Information (Signed)
Summary: rov/sp  Anticoagulant Therapy  Managed by: Weston Brass, PharmD Referring MD: Valera Castle MD PCP: Shaune Pollack Supervising MD: Myrtis Ser MD, Tinnie Gens Indication 1: Atrial Fibrillation (ICD-427.31) Lab Used: [facility] Site: [facility] INR POC 2.9 INR RANGE 2.3 - 3  Dietary changes: no    Health status changes: no    Bleeding/hemorrhagic complications: no    Recent/future hospitalizations: no    Any changes in medication regimen? no    Recent/future dental: no  Any missed doses?: no       Is patient compliant with meds? yes       Allergies: 1)  ! Codeine 2)  ! Penicillin  Anticoagulation Management History:      The patient is taking warfarin and comes in today for a routine follow up visit.  Positive risk factors for bleeding include an age of 75 years or older and history of CVA/TIA.  The bleeding index is 'intermediate risk'.  Positive CHADS2 values include History of HTN, Age > 38 years old, and Prior Stroke/CVA/TIA.  The start date was 02/13/2001.  Her last INR was 2.0 ratio.  Anticoagulation responsible provider: Myrtis Ser MD, Tinnie Gens.  INR POC: 2.9.  Cuvette Lot#: [id number].  Exp: 09/2011.    Anticoagulation Management Assessment/Plan:      The patient's current anticoagulation dose is Coumadin 5 mg tabs: Take by mouth as directed.  The target INR is 2.3-3.0.  The next INR is due 09/08/2010.  Anticoagulation instructions were given to patient/daughter.  Results were reviewed/authorized by Weston Brass, PharmD.  She was notified by Kennieth Francois.         Prior Anticoagulation Instructions: INR 2.3  Continue 1/2 tablet daily except 1 tablet Mon and Fri.  Return to clinic in 4 weeks.  Current Anticoagulation Instructions: INR 2.9  Continue one-half tablet every day except for one tablet on Monday and Friday.  Recheck in four weeks.

## 2010-12-15 NOTE — Medication Information (Signed)
Summary: rov/ln  Anticoagulant Therapy  Managed by: Bethena Midget, RN, BSN Referring MD: Valera Castle MD PCP: Shaune Pollack Supervising MD: Jens Som MD, Arlys John Indication 1: Atrial Fibrillation (ICD-427.31) Lab Used: [facility] Site: [facility] INR POC 2.4 INR RANGE 2.3 - 3  Dietary changes: no    Health status changes: no    Bleeding/hemorrhagic complications: no    Recent/future hospitalizations: no    Any changes in medication regimen? no    Recent/future dental: no  Any missed doses?: no       Is patient compliant with meds? yes       Allergies: 1)  ! Codeine 2)  ! Penicillin  Anticoagulation Management History:      The patient is taking warfarin and comes in today for a routine follow up visit.  Positive risk factors for bleeding include an age of 75 years or older and history of CVA/TIA.  The bleeding index is 'intermediate risk'.  Positive CHADS2 values include History of HTN, Age > 75 years old, and Prior Stroke/CVA/TIA.  The start date was 02/13/2001.  Her last INR was 2.0 ratio.  Anticoagulation responsible provider: Jens Som MD, Arlys John.  INR POC: 2.4.  Cuvette Lot#: [id number].  Exp: 08/2011.    Anticoagulation Management Assessment/Plan:      The patient's current anticoagulation dose is Coumadin 5 mg tabs: Take by mouth as directed.  The target INR is 2.3-3.0.  The next INR is due 07/14/2010.  Anticoagulation instructions were given to patient/daughter.  Results were reviewed/authorized by Bethena Midget, RN, BSN.  She was notified by Bethena Midget, RN, BSN.         Prior Anticoagulation Instructions: INR 2.7  Continue same regimen on 1 tab on Monday and Friday and 0.5 tab on Sunday, Tuesday, Wednesday, Thursday, and Saturday.  Re-check INR in 3 weeks.  Current Anticoagulation Instructions: INR 2.4 Continue 2.5mg s everyday except 5mg s on Mondays and Fridays. Recheck in 4 weeks.

## 2010-12-15 NOTE — Medication Information (Signed)
Summary: rov/jm  Anticoagulant Therapy  Managed by: Bethena Midget, RN, BSN Referring MD: Valera Castle MD PCP: Shaune Pollack Supervising MD: Eden Emms MD, Theron Arista Indication 1: Atrial Fibrillation (ICD-427.31) Lab Used: [facility] Site: [facility] INR POC 1.7 INR RANGE 2.3 - 3  Dietary changes: no    Health status changes: no    Bleeding/hemorrhagic complications: no    Recent/future hospitalizations: no    Any changes in medication regimen? no    Recent/future dental: no  Any missed doses?: no       Is patient compliant with meds? yes       Allergies: 1)  ! Codeine 2)  ! Penicillin  Anticoagulation Management History:      The patient is taking warfarin and comes in today for a routine follow up visit.  Positive risk factors for bleeding include an age of 75 years or older and history of CVA/TIA.  The bleeding index is 'intermediate risk'.  Positive CHADS2 values include History of HTN, Age > 75 years old, and Prior Stroke/CVA/TIA.  The start date was 02/13/2001.  Her last INR was 2.0 ratio.  Anticoagulation responsible provider: Eden Emms MD, Theron Arista.  INR POC: 1.7.  Cuvette Lot#: [id number].  Exp: 10/2011.    Anticoagulation Management Assessment/Plan:      The patient's current anticoagulation dose is Coumadin 5 mg tabs: Take by mouth as directed.  The target INR is 2.3-3.0.  The next INR is due 09/22/2010.  Anticoagulation instructions were given to patient/daughter.  Results were reviewed/authorized by Bethena Midget, RN, BSN.  She was notified by Bethena Midget, RN, BSN.         Prior Anticoagulation Instructions: INR 2.9  Continue one-half tablet every day except for one tablet on Monday and Friday.  Recheck in four weeks.   Current Anticoagulation Instructions: INR 1.7 Today take 5mg s then resume 2.5mg s daily except 5mg s on Mondays and Fridays. Recheck in 2 weeks.

## 2010-12-15 NOTE — Assessment & Plan Note (Signed)
Summary: 6 MO F/U /CY   Visit Type:  6  mo f/u Primary Provider:  Shaune Simpson  CC:  no cardiac complaints today.  History of Present Illness: [name] comes in today for evaluation and management of her history of coronary artery disease, history of prior bypass grafting, aortic valve replacement, history of carotid artery disease, hypertension, mixed hyperlipidemia, and anticoagulation.  She's having no angina or ischemic symptoms other than dyspnea on exertion.  She denies orthopnea, PND or edema. She's had no palpitations or syncope. She has had no further transient ischemic attacks or global amnesia.  She presented physical by Dr. Juanetta Simpson. Her LDL is now to 101 from a low of 85 since we switched her from Vytorin to simvastatin. I recommend we go back on Vytorin or at least to Lipitor her Crestor.  Her last echo was a stress study and was over 2 years ago. She is do carotid Dopplers as well.     Current Medications (verified): 1)  Coumadin 5 Mg Tabs (Warfarin Sodium) .... Take By Mouth As Directed 2)  Furosemide 20 Mg Tabs (Furosemide) .... Take 1 Tablet By Mouth Once A Day 3)  Lotrel 5-20 Mg Caps (Amlodipine Besy-Benazepril Hcl) .... Take 1 Capsule By Mouth Once A Day 4)  Metoprolol Tartrate 50 Mg Tabs (Metoprolol Tartrate) .... Take 1 Tablet By Mouth Twice A Day 5)  Potassium Chloride Crys Cr 10 Meq Cr-Tabs (Potassium Chloride Crys Cr) .... Take 1 Tablet By Mouth Once A Day 6)  Aspirin 81 Mg Tbec (Aspirin) .... Take One Tablet By Mouth Daily 7)  Aricept Odt 10 Mg Tbdp (Donepezil Hcl) .Marland Kitchen.. 1 Tab Once Daily 8)  Ambien 5 Mg Tabs (Zolpidem Tartrate) .Marland Kitchen.. 1 Tab At Bedtime 9)  Amitriptyline Hcl 10 Mg Tabs (Amitriptyline Hcl) .... Take 1 Tablet By Mouth At Bedtime 10)  Melatonin 3 Mg Tabs (Melatonin) .... Take 1 Tablet By Mouth At Bedtime 11)  Ocuvite  Tabs (Multiple Vitamins-Minerals) .... Take 1 Tablet By Mouth Once A Day 12)  Simvastatin 40 Mg Tabs (Simvastatin) .Marland Kitchen.. 1 Once  Daily 13)  Vitamin D (Ergocalciferol) 50000 Unit Caps (Ergocalciferol) .Marland Kitchen.. 1 Tab Weekly  Allergies: 1)  ! Codeine 2)  ! Penicillin  Past History:  Past Surgical History: Last updated: 03/06/2009 aortic valve replacement  Social History: Last updated: 03/06/2009 Tobacco Use - No.   Risk Factors: Smoking Status: never (03/06/2009)  Review of Systems       negative other than history of present illness  Vital Signs:  Patient profile:   77 year old female Height:      66 inches Weight:      119 pounds BMI:     19.28 Pulse rate:   54 / minute Pulse rhythm:   regular BP sitting:   128 / 50  (left arm) Cuff size:   large  Vitals Entered By: [name], CMA (Mar 16, 2010 11:56 AM)  Physical Exam  General:  she appears to have lost weight. Head:  normocephalic and atraumatic Eyes:  PERRLA/EOM intact; conjunctiva and lids normal. Mouth:  Teeth, gums and palate normal. Oral mucosa normal. Neck:  Neck supple, no JVD. No masses, thyromegaly or abnormal cervical nodes. Chest [name]:  no deformities or breast masses noted Lungs:  Clear bilaterally to auscultation and percussion. Heart:  PMI nondisplaced, normal S1 prosthetic S2 paradoxically split S2, systolic murmur, no diastolic murmur heard., regular rate and rhythm Abdomen:  Bowel sounds positive; abdomen soft and non-tender without  masses, organomegaly, or hernias noted. No hepatosplenomegaly. Msk:  Back normal, normal gait. Muscle strength and tone normal. Pulses:  pulses normal in all 4 extremities Extremities:  No clubbing or cyanosis. Neurologic:  Alert and oriented x 3. Skin:  Intact without lesions or rashes. Psych:  Normal affect.   Impression & Recommendations:  Problem # 1:  CAD, ARTERY BYPASS GRAFT (ICD-414.04) I will arrange a stress echo. This way we can assess her coronary artery disease as well as her aortic valve. Her updated medication list for this problem includes:    Coumadin 5 Mg Tabs (Warfarin  sodium) .Marland Kitchen... Take by mouth as directed    Lotrel 5-20 Mg Caps (Amlodipine besy-benazepril hcl) .Marland Kitchen... Take 1 capsule by mouth once a day    Metoprolol Tartrate 50 Mg Tabs (Metoprolol tartrate) .Marland Kitchen... Take 1 tablet by mouth twice a day    Aspirin 81 Mg Tbec (Aspirin) .Marland Kitchen... Take one tablet by mouth daily  Orders: Stress Echo (Stress Echo)  Problem # 2:  AORTIC VALVE REPLACEMENT, HX OF (ICD-V43.3)  Problem # 3:  CAROTID ARTERY STENOSIS, WITHOUT INFARCTION (ICD-433.10)  I will arrange carotid Dopplers. No change in treatment Her updated medication list for this problem includes:    Coumadin 5 Mg Tabs (Warfarin sodium) .Marland Kitchen... Take by mouth as directed    Aspirin 81 Mg Tbec (Aspirin) .Marland Kitchen... Take one tablet by mouth daily  Orders: Carotid Duplex (Carotid Duplex)  Problem # 4:  CVA (ICD-434.91) Assessment: Unchanged  Her updated medication list for this problem includes:    Coumadin 5 Mg Tabs (Warfarin sodium) .Marland Kitchen... Take by mouth as directed    Aspirin 81 Mg Tbec (Aspirin) .Marland Kitchen... Take one tablet by mouth daily  Problem # 5:  HYPERTENSION, UNSPECIFIED (ICD-401.9) Assessment: Improved  Her updated medication list for this problem includes:    Furosemide 20 Mg Tabs (Furosemide) .Marland Kitchen... Take 1 tablet by mouth once a day    Lotrel 5-20 Mg Caps (Amlodipine besy-benazepril hcl) .Marland Kitchen... Take 1 capsule by mouth once a day    Metoprolol Tartrate 50 Mg Tabs (Metoprolol tartrate) .Marland Kitchen... Take 1 tablet by mouth twice a day    Aspirin 81 Mg Tbec (Aspirin) .Marland Kitchen... Take one tablet by mouth daily  Problem # 6:  ENCOUNTER FOR LONG-TERM USE OF OTHER MEDICATIONS (ICD-V58.69) Assessment: Unchanged  Problem # 7:  HYPERLIPIDEMIA-MIXED (ICD-272.4) Assessment: Deteriorated She is not at goal on simvastatin. I do not want to increase her to 80 mg a day because of side effects. There is also potential for drug drug interactions. I have rewritten her for Vytorin 10/40. Differential has denies, will try Crestor 40. Her  updated medication list for this problem includes:    Vytorin 10-40 Mg Tabs (Ezetimibe-simvastatin) .Marland Kitchen... 1 once daily  Problem # 8:  LBBB (ICD-426.3) Assessment: Unchanged  Her updated medication list for this problem includes:    Coumadin 5 Mg Tabs (Warfarin sodium) .Marland Kitchen... Take by mouth as directed    Lotrel 5-20 Mg Caps (Amlodipine besy-benazepril hcl) .Marland Kitchen... Take 1 capsule by mouth once a day    Metoprolol Tartrate 50 Mg Tabs (Metoprolol tartrate) .Marland Kitchen... Take 1 tablet by mouth twice a day    Aspirin 81 Mg Tbec (Aspirin) .Marland Kitchen... Take one tablet by mouth daily  Patient Instructions: 1)  Your physician recommends that you schedule a follow-up appointment in: 6 months with dr [name] 2)  Your physician recommends that you return for lab work in:6 weeks lipid liver 3)  Your physician has recommended you  make the following change in your medication: STOP SIMVASTATIN  4)  RESTART VYTORIN 10/40 MG 1 once daily  5)  Your physician has requested that you have a stress echocardiogram. For further information please visit [email or web address]  Please follow instruction sheet as given. 6)  Your physician has requested that you have a carotid duplex. This test is an ultrasound of the carotid arteries in your neck. It looks at blood flow through these arteries that supply the brain with blood. Allow one hour for this exam. There are no restrictions or special instructions. Prescriptions: VYTORIN 10-40 MG TABS (EZETIMIBE-SIMVASTATIN) 1 once daily  #30 x 11   Entered by:   Colleen Bateman, LPN   Authorized by:   Colleen Shih, MD, [facility]   Signed by:   Colleen Bateman, LPN on 16/08/9603   Method used:   Electronically to        [facility] (retail)       [address]       [place], [place]  [address]       Ph: [phone]       Fax: [phone]   RxID:   [id number]

## 2010-12-15 NOTE — Medication Information (Signed)
Summary: rov/tm  Anticoagulant Therapy  Managed by: Reina Fuse, PharmD Referring MD: Valera Castle MD PCP: Shaune Pollack Supervising MD: Eden Emms MD, Theron Arista Indication 1: Atrial Fibrillation (ICD-427.31) Lab Used: [facility] Site: [facility] INR POC 3.1 INR RANGE 2.3 - 3  Dietary changes: yes       Details: Has not eaten serving of greens yet this week.   Health status changes: no    Bleeding/hemorrhagic complications: no    Recent/future hospitalizations: no    Any changes in medication regimen? no    Recent/future dental: no  Any missed doses?: no       Is patient compliant with meds? yes       Allergies: 1)  ! Codeine 2)  ! Penicillin  Anticoagulation Management History:      The patient is taking warfarin and comes in today for a routine follow up visit.  Positive risk factors for bleeding include an age of 31 years or older and history of CVA/TIA.  The bleeding index is 'intermediate risk'.  Positive CHADS2 values include History of HTN, Age > 36 years old, and Prior Stroke/CVA/TIA.  The start date was 02/13/2001.  Her last INR was 2.0 ratio.  Anticoagulation responsible provider: Eden Emms MD, Theron Arista.  INR POC: 3.1.  Cuvette Lot#: [id number].  Exp: 10/2011.    Anticoagulation Management Assessment/Plan:      The patient's current anticoagulation dose is Coumadin 5 mg tabs: Take by mouth as directed.  The target INR is 2.3-3.0.  The next INR is due 10/14/2010.  Anticoagulation instructions were given to patient/daughter.  Results were reviewed/authorized by Reina Fuse, PharmD.  She was notified by Reina Fuse PharmD.         Prior Anticoagulation Instructions: INR 1.7 Today take 5mg s then resume 2.5mg s daily except 5mg s on Mondays and Fridays. Recheck in 2 weeks.   Current Anticoagulation Instructions: INR 3.1  Today, Wednesday, November 9th, do not take Coumadin. Then, resume taking Coumadin 1 tab (5 mg) on Mon and Fri and Coumadin 0.5 tab (2.5 mg) all other days.  Return to  clinic in 3 weeks. The patient is to hold four doses of coumadin.  The dosage to be resumed includes:

## 2010-12-15 NOTE — Medication Information (Signed)
Summary: Tax adviser   Imported By: Kassie Mends 12/23/2009 10:51:48  _____________________________________________________________________  External Attachment:    Type:   Image     Comment:   External Document

## 2010-12-15 NOTE — Medication Information (Signed)
Summary: rov/ewj  Anticoagulant Therapy  Managed by: Eda Keys, PharmD Referring MD: Valera Castle MD PCP: Shaune Pollack Supervising MD: Clifton James MD, Cristal Deer Indication 1: Atrial Fibrillation (ICD-427.31) Lab Used: [facility] Site: [facility] INR POC 2.8 INR RANGE 2.3 - 3  Dietary changes: no    Health status changes: no    Bleeding/hemorrhagic complications: no    Recent/future hospitalizations: no    Any changes in medication regimen? yes       Details: finished z-pack and ceftin 3 weeks ago  Recent/future dental: no  Any missed doses?: no       Is patient compliant with meds? yes       Allergies: 1)  ! Codeine 2)  ! Penicillin  Anticoagulation Management History:      The patient is taking warfarin and comes in today for a routine follow up visit.  Positive risk factors for bleeding include an age of 75 years or older and history of CVA/TIA.  The bleeding index is 'intermediate risk'.  Positive CHADS2 values include History of HTN, Age > 60 years old, and Prior Stroke/CVA/TIA.  The start date was 02/13/2001.  Anticoagulation responsible provider: Clifton James MD, Cristal Deer.  INR POC: 2.8.  Cuvette Lot#: [id number].  Exp: 03/2011.    Anticoagulation Management Assessment/Plan:      The patient's current anticoagulation dose is Coumadin 5 mg tabs: Take by mouth as directed.  The target INR is 2.3-3.0.  The next INR is due 03/09/2010.  Anticoagulation instructions were given to patient.  Results were reviewed/authorized by Eda Keys, PharmD.  She was notified by Eda Keys.         Prior Anticoagulation Instructions: INR 2.8  Continue on same dosage 1/2 tablet daily except 1 tablet on Mondays and Fridays.  Recheck in 4 weeks.    Current Anticoagulation Instructions: INR 2.8  Continue taking 1 tablet on Monday and Friday and 1/2 tablet all other days.  Return to clinic in 4 weeks.

## 2010-12-15 NOTE — Progress Notes (Signed)
Summary: talk to nurse  Phone Note Call from Patient Call back at [phone]   Caller: daughter Lisbeth Ply Reason for Call: Talk to Nurse Summary of Call: Pt received letter from [facility] stating that she needed a step up for her vytorin. Please call pt daughter to discuss this Initial call taken by: Edman Circle,  December 01, 2009 1:25 PM  Follow-up for Phone Call        SPOKE WITH DAUGHTER RE MESSAGE INSURANCE CO WANTING TO CHANGE VYTORIN TO ANOTHER LESS EXPENSIVE  MED  PLEASE GIVE DIRECTION PHONE NO. FOR CO. [phone] Follow-up by: Scherrie Bateman, LPN,  December 01, 2009 3:03 PM  Additional Follow-up for Phone Call Additional follow up Details #1::        40mg  of Simvastatin. Additional Follow-up by: Gaylord Shih, MD, [facility],  December 01, 2009 5:20 PM     Appended Document: talk to nurse DAUGHTER AWARE OF OKAY TO CHANGE MED PER DR Keshawn Fiorito INSTRUCTED WILL CALL INS CO AND PHARMACY.  Appended Document: talk to nurse SPOKE WITH INS CO WILL FAX FORM TO BE FILLED OUT AND RETURNED WITH NEW TX PLAN.   Appended Document: talk to nurse PT USES [facility] IN [place]

## 2010-12-15 NOTE — Letter (Signed)
Summary: [facility] Office Note  [facility] Office Note   Imported By: Roderic Ovens 02/13/2010 11:08:30  _____________________________________________________________________  External Attachment:    Type:   Image     Comment:   External Document

## 2010-12-15 NOTE — Cardiovascular Report (Signed)
Summary: Cath/Percutaneous Orders  Cath/Percutaneous Orders   Imported By: Roderic Ovens 04/23/2010 15:06:22  _____________________________________________________________________  External Attachment:    Type:   Image     Comment:   External Document

## 2010-12-15 NOTE — Medication Information (Signed)
Summary: rov/td  Anticoagulant Therapy  Managed by: Colleen Reams, RN, BSN Referring Simpson: [name] PCP: [name] Supervising Simpson: [name], [name] Indication 1: Atrial Fibrillation (ICD-427.31) Lab Used: [facility] Site: [facility] INR POC 3.5 INR RANGE 2.3 - 3  Dietary changes: yes       Details: Ate more salads while on abx.    Health status changes: no    Bleeding/hemorrhagic complications: no    Recent/future hospitalizations: no    Any changes in medication regimen? yes       Details: Pt was on Ceftin 250mg  bid x 10 days started on 12/29 completed 11/21/09.  Recent/future dental: no  Any missed doses?: no       Is patient compliant with meds? yes       Allergies (verified): 1)  ! Codeine 2)  ! Penicillin  Anticoagulation Management History:      The patient is taking warfarin and comes in today for a routine follow up visit.  Positive risk factors for bleeding include an age of 74 years or older and history of CVA/TIA.  The bleeding index is 'intermediate risk'.  Positive CHADS2 values include History of HTN, Age > 10 years old, and Prior Stroke/CVA/TIA.  The start date was 02/13/2001.  Anticoagulation responsible provider: Jens Som Simpson, [name].  INR POC: 3.5.  Cuvette Lot#: [id number].  Exp: 02/2011.    Anticoagulation Management Assessment/Plan:      The patient's current anticoagulation dose is Coumadin 5 mg tabs: Take by mouth as directed.  The target INR is 2.3-3.0.  The next INR is due 12/22/2009.  Anticoagulation instructions were given to patient.  Results were reviewed/authorized by Colleen Reams, RN, BSN.  She was notified by Colleen Reams RN.         Prior Anticoagulation Instructions: INR 2.7  Maintain current regimen of 0.5 tab daily (2.5 mg) except 1 tab (5 mg) on Mondays and Fridays  Recheck in 4 weeks.    Current Anticoagulation Instructions: INR 3.5  Skip today's dose of coumadin, then resume same dosage 1/2 tablet daily except 1 tablet on  Mondays and Fridays.  Recheck in 3 weeks.

## 2010-12-15 NOTE — Progress Notes (Signed)
Summary: appt for labs   Phone Note Call from Patient Call back at Home Phone [phone] Call back at [phone]   Caller: Patient Reason for Call: Talk to Nurse Summary of Call: need to set up labs for change of meds. please call daughter [name] about appt. need to set up next time she is her for coumadin/ which is 02-09-10. Initial call taken by: Omar Person,  January 12, 2010 10:27 AM  Follow-up for Phone Call        S/W [name] and she said that Wynona Canes had told them to repeat labs in 6-8 weeks after starting Simvastin. I do not see a note stating what labs need drawn. I will forward this message to Wynona Canes so that she can set up for labs on 02/09/10 so that she can get it done at the same time as her Coumadin check. [name] is aware that Wynona Canes will be back tomorrow and will wait to hear from her.  Follow-up by: Duncan Dull, RN, BSN,  January 12, 2010 11:09 AM  Additional Follow-up for Phone Call Additional follow up Details #1::        OK Additional Follow-up by: Gaylord Shih, MD, [facility],  January 12, 2010 11:44 AM     Appended Document: appt for labs  PT'S DAUGHTER AWARE LABS SCHEDULED SAME DAY AS COUMADIN APPT./CY

## 2010-12-15 NOTE — Letter (Signed)
Summary: Dr.'s PT/INR Order's   Dr.'s PT/INR Order's   Imported By: Marylou Mccoy 04/20/2010 16:00:20  _____________________________________________________________________  External Attachment:    Type:   Image     Comment:   External Document

## 2010-12-15 NOTE — Medication Information (Signed)
Summary: rov/tm  Anticoagulant Therapy  Managed by: Weston Brass, PharmD Referring MD: Valera Castle MD PCP: Shaune Pollack Supervising MD: Jens Som MD, Arlys John Indication 1: Atrial Fibrillation (ICD-427.31) Lab Used: [facility] Site: [facility] INR POC 2.3 INR RANGE 2.3 - 3  Dietary changes: no    Health status changes: no    Bleeding/hemorrhagic complications: no    Recent/future hospitalizations: no    Any changes in medication regimen? no    Recent/future dental: no  Any missed doses?: no       Is patient compliant with meds? yes       Allergies: 1)  ! Codeine 2)  ! Penicillin  Anticoagulation Management History:      The patient is taking warfarin and comes in today for a routine follow up visit.  Positive risk factors for bleeding include an age of 75 years or older and history of CVA/TIA.  The bleeding index is 'intermediate risk'.  Positive CHADS2 values include History of HTN, Age > 18 years old, and Prior Stroke/CVA/TIA.  The start date was 02/13/2001.  Her last INR was 2.0 ratio.  Anticoagulation responsible provider: Jens Som MD, Arlys John.  INR POC: 2.3.  Cuvette Lot#: [id number].  Exp: 08/2011.    Anticoagulation Management Assessment/Plan:      The patient's current anticoagulation dose is Coumadin 5 mg tabs: Take by mouth as directed.  The target INR is 2.3-3.0.  The next INR is due 08/11/2010.  Anticoagulation instructions were given to patient/daughter.  Results were reviewed/authorized by Weston Brass, PharmD.  She was notified by Liana Gerold, PharmD Candidate.         Prior Anticoagulation Instructions: INR 2.4 Continue 2.5mg s everyday except 5mg s on Mondays and Fridays. Recheck in 4 weeks.   Current Anticoagulation Instructions: INR 2.3  Continue 1/2 tablet daily except 1 tablet Mon and Fri.  Return to clinic in 4 weeks.

## 2010-12-15 NOTE — Medication Information (Signed)
Summary: rov/sp  Anticoagulant Therapy  Managed by: Elaina Pattee, PharmD Referring MD: Valera Castle MD PCP: Shaune Pollack Supervising MD: Jens Som MD, Arlys John Indication 1: Atrial Fibrillation (ICD-427.31) Lab Used: [facility] Site: [facility] INR POC 1.8 INR RANGE 2.3 - 3  Dietary changes: no    Health status changes: no    Bleeding/hemorrhagic complications: no    Recent/future hospitalizations: yes       Details: Cath on 04/21/10 (Held Coumadin from 6/4-6/6). Restarted Coumadin at normal dose that evening.  Any changes in medication regimen? no    Recent/future dental: no  Any missed doses?: no       Is patient compliant with meds? yes       Allergies: 1)  ! Codeine 2)  ! Penicillin  Anticoagulation Management History:      The patient is taking warfarin and comes in today for a routine follow up visit.  Positive risk factors for bleeding include an age of 75 years or older and history of CVA/TIA.  The bleeding index is 'intermediate risk'.  Positive CHADS2 values include History of HTN, Age > 10 years old, and Prior Stroke/CVA/TIA.  The start date was 02/13/2001.  Her last INR was 2.0 ratio.  Anticoagulation responsible provider: Jens Som MD, Arlys John.  INR POC: 1.8.  Cuvette Lot#: [id number].  Exp: 06/2011.    Anticoagulation Management Assessment/Plan:      The patient's current anticoagulation dose is Coumadin 5 mg tabs: Take by mouth as directed.  The target INR is 2.3-3.0.  The next INR is due 05/29/2010.  Anticoagulation instructions were given to patient.  Results were reviewed/authorized by Elaina Pattee, PharmD.  She was notified by Elaina Pattee, PharmD.         Prior Anticoagulation Instructions: INR 2.3  Continue same dose of 1/2 tablets every day except 1 tablet on Monday and Friday   Current Anticoagulation Instructions: INR 1.8. Take 1.5 tablets today, then take 0.5 tablet daily except 1 tablet on Mon and Fri. Recheck in 4 weeks.

## 2010-12-15 NOTE — Progress Notes (Signed)
Summary: cath  Phone Note Call from Patient Call back at [phone] or [phone]   Caller: Daughter/[name] Reason for Call: Talk to Nurse Summary of Call: wants to know if the cath has been sch Initial call taken by: Migdalia Dk,  Apr 14, 2010 10:21 AM  Follow-up for Phone Call        Spoke with [name], states she thinks cath is to be June 7, and that she is supposed to hold Coumadin starting Saturday.  ? when she should bring mother in for bloodwork stating that Wynona Canes said maybe Friday or Monday.  Will forward to [place] for her review.  Daughter Eber Hong will follow up with her tomorrow.  Follow-up by: Charolotte Capuchin, RN,  Apr 14, 2010 10:53 AM  Additional Follow-up for Phone Call Additional follow up Details #1::        SPOKE WITH DAUGHTER WILL COME IN ON FRIDAY FOR PRE CATH LABS AND INFORMED WILL LEAVE INSTRUCTIONS AT FRONT DESK.CATH SCHEDULED ON JUNE 7,2011 AT 9:00 AM WITH DR Riley Kill IN MAIN LAB PER DR Kass Herberger. Additional Follow-up by: Scherrie Bateman, LPN,  April 15, 1913 9:22 AM

## 2010-12-15 NOTE — Progress Notes (Signed)
Summary: question- cath  Phone Note Call from Patient Call back at Home Phone [phone] Call back at [phone]   Caller: Daughter Reason for Call: Talk to Nurse Summary of Call: has question regarding cath Initial call taken by: Lorne Skeens,  April 16, 2010 2:44 PM  Follow-up for Phone Call        Phone Call Completed Follow-up by: Scherrie Bateman, LPN,  April 16, 4781 5:27 PM

## 2010-12-16 ENCOUNTER — Encounter: Payer: Self-pay | Admitting: Cardiology

## 2010-12-17 NOTE — Assessment & Plan Note (Signed)
Summary: f21m/dfg   Visit Type:  Follow-up Primary Provider:  Shaune Pollack  CC:  no complaints.  History of Present Illness: [name] returns today for followup evaluation and management of her multiple cardiac, valvular, and vascular issues.  She is having no symptoms of angina or ischemia. She denies orthopnea, PND or edema. She's had no palpitations, presyncope or syncope. She's had no more confusion events. She works on a regular basis.    Her last stress echocardiogram was in May of 2011. Ejection fraction is 45-50%, showed septal dyssynergy from her left bundle branch block, if with exercise was 40%. Subsequent catheterization showed occluded grafts, patent native vessels with no obstructive disease, no significant aortic regurgitation. Medical therapy recommended.  Left carotid Dopplers showed nonobstructive disease. This was in May of 2011.  Last standard echo was in June of 2009. At that time she had mild aortic regurgitation, moderate mitral and calcification, mild mitral regurgitation, no evidence of pulmonary hypertension, moderate left atrial enlargement.  She denies any bleeding or problem with Coumadin.   Current Medications (verified): 1)  Coumadin 5 Mg Tabs (Warfarin Sodium) .... Take By Mouth As Directed 2)  Furosemide 20 Mg Tabs (Furosemide) .... Take 1 Tablet By Mouth Once A Day 3)  Lotrel 5-20 Mg Caps (Amlodipine Besy-Benazepril Hcl) .... Take 1 Capsule By Mouth Once A Day 4)  Metoprolol Tartrate 50 Mg Tabs (Metoprolol Tartrate) .... Take 1 Tablet By Mouth Twice A Day 5)  Potassium Chloride Crys Cr 10 Meq Cr-Tabs (Potassium Chloride Crys Cr) .... Take 1 Tablet By Mouth Once A Day 6)  Aspirin 81 Mg Tbec (Aspirin) .... Take One Tablet By Mouth Daily 7)  Aricept Odt 10 Mg Tbdp (Donepezil Hcl) .Marland Kitchen.. 1 Tab Once Daily 8)  Ambien 5 Mg Tabs (Zolpidem Tartrate) .Marland Kitchen.. 1 Tab At Bedtime 9)  Amitriptyline Hcl 10 Mg Tabs (Amitriptyline Hcl) .... Take 1 Tablet By Mouth At  Bedtime 10)  Melatonin 3 Mg Tabs (Melatonin) .... Take 1 Tablet By Mouth At Bedtime 11)  Ocuvite  Tabs (Multiple Vitamins-Minerals) .... Take 1 Tablet By Mouth Once A Day 12)  Lipitor 40 Mg Tabs (Atorvastatin Calcium) .Marland Kitchen.. 1 Once Daily 13)  Vitamin D (Ergocalciferol) 50000 Unit Caps (Ergocalciferol) .Marland Kitchen.. 1 Tab Weekly  Allergies (verified): 1)  ! Codeine 2)  ! Penicillin  Past History:  Past Medical History: Last updated: 05/08/2010 PURE HYPERCHOLESTEROLEMIA (ICD-272.0) ENCOUNTER FOR LONG-TERM USE OF OTHER MEDICATIONS (ICD-V58.69) CAROTID ARTERY STENOSIS, WITHOUT INFARCTION (ICD-433.10) AORTIC VALVE REPLACEMENT, HX OF (ICD-V43.3) CVA (ICD-434.91) HYPERTENSION, UNSPECIFIED (ICD-401.9) CAD, ARTERY BYPASS GRAFT (ICD-414.04) HYPERLIPIDEMIA-MIXED (ICD-272.4) LBBB (ICD-426.3)  Past Surgical History: Last updated: 05/08/2010 aortic valve replacement CABG  Social History: Last updated: 03/06/2009 Tobacco Use - No.   Risk Factors: Smoking Status: never (03/06/2009)  Review of Systems       negative other than history of present illness  Vital Signs:  Patient profile:   77 year old female Height:      66 inches Weight:      119 pounds BMI:     19.28 Pulse rate:   65 / minute BP sitting:   124 / 58  (left arm) Cuff size:   regular  Vitals Entered By: Hardin Negus, RMA (November 25, 2010 10:03 AM)  Physical Exam  General:  Well developed, well nourished, in no acute distress. Head:  normocephalic and atraumatic Eyes:  PERRLA/EOM intact; conjunctiva and lids normal. Neck:  Neck supple, no JVD. No masses, thyromegaly or abnormal cervical nodes. Chest  Salman Wellen:  no deformities or breast masses noted Lungs:  Clear bilaterally to auscultation and percussion. Heart:  PMI nondisplaced, regular rate and rhythm, paradoxically split prosthetic S2, 2/6 aortic insufficiency murmur which appears a little lower than before, no gallop, bilateral carotid bruits versus referred  sounds. Abdomen:  soft, good bowel sounds, no midline bruit Msk:  decreased ROM.   Pulses:  pulses normal in all 4 extremities Extremities:  No clubbing or cyanosis. Neurologic:  Alert and oriented x 3. Skin:  Intact without lesions or rashes. Psych:  Normal affect.   Impression & Recommendations:  Problem # 1:  AORTIC VALVE REPLACEMENT, HX OF (ICD-V43.3)  Her diastolic murmur seems more prominent today. We'll repeat 2-D echocardiogram to assess her aortic valve and aortic regurgitation.  Orders: Echocardiogram (Echo)  Problem # 2:  CAROTID ARTERY STENOSIS, WITHOUT INFARCTION (ICD-433.10) Assessment: Unchanged repeat carotid Dopplers in May of 2012 Her updated medication list for this problem includes:    Coumadin 5 Mg Tabs (Warfarin sodium) .Marland Kitchen... Take by mouth as directed    Aspirin 81 Mg Tbec (Aspirin) .Marland Kitchen... Take one tablet by mouth daily  Problem # 3:  HYPERTENSION, UNSPECIFIED (ICD-401.9) Assessment: Improved  Her updated medication list for this problem includes:    Furosemide 20 Mg Tabs (Furosemide) .Marland Kitchen... Take 1 tablet by mouth once a day    Lotrel 5-20 Mg Caps (Amlodipine besy-benazepril hcl) .Marland Kitchen... Take 1 capsule by mouth once a day    Metoprolol Tartrate 50 Mg Tabs (Metoprolol tartrate) .Marland Kitchen... Take 1 tablet by mouth twice a day    Aspirin 81 Mg Tbec (Aspirin) .Marland Kitchen... Take one tablet by mouth daily  Problem # 4:  CAD, ARTERY BYPASS GRAFT (ICD-414.04) Assessment: Unchanged  Her updated medication list for this problem includes:    Coumadin 5 Mg Tabs (Warfarin sodium) .Marland Kitchen... Take by mouth as directed    Lotrel 5-20 Mg Caps (Amlodipine besy-benazepril hcl) .Marland Kitchen... Take 1 capsule by mouth once a day    Metoprolol Tartrate 50 Mg Tabs (Metoprolol tartrate) .Marland Kitchen... Take 1 tablet by mouth twice a day    Aspirin 81 Mg Tbec (Aspirin) .Marland Kitchen... Take one tablet by mouth daily  Problem # 5:  LBBB (ICD-426.3) Assessment: Unchanged  Her updated medication list for this problem includes:     Coumadin 5 Mg Tabs (Warfarin sodium) .Marland Kitchen... Take by mouth as directed    Lotrel 5-20 Mg Caps (Amlodipine besy-benazepril hcl) .Marland Kitchen... Take 1 capsule by mouth once a day    Metoprolol Tartrate 50 Mg Tabs (Metoprolol tartrate) .Marland Kitchen... Take 1 tablet by mouth twice a day    Aspirin 81 Mg Tbec (Aspirin) .Marland Kitchen... Take one tablet by mouth daily  Problem # 6:  HYPERLIPIDEMIA-MIXED (ICD-272.4)  Her updated medication list for this problem includes:    Lipitor 40 Mg Tabs (Atorvastatin calcium) .Marland Kitchen... 1 once daily  Patient Instructions: 1)  Your physician recommends that you schedule a follow-up appointment in: 6 months with Dr. Daleen Squibb 2)  Your physician recommends that you continue on your current medications as directed. Please refer to the Current Medication list given to you today. 3)  Your physician has requested that you have an echocardiogram.  Echocardiography is a painless test that uses sound waves to create images of your heart. It provides your doctor with information about the size and shape of your heart and how well your heart's chambers and valves are working.  This procedure takes approximately one hour. There are no restrictions for this procedure.

## 2010-12-17 NOTE — Medication Information (Signed)
Summary: rov/mwb  Anticoagulant Therapy  Managed by: Windell Hummingbird, RN Referring MD: Valera Castle MD PCP: Shaune Pollack Supervising MD: Graciela Husbands MD, Viviann Spare Indication 1: Atrial Fibrillation (ICD-427.31) Lab Used: [facility] Site: [facility] INR POC 1.9 INR RANGE 2.3 - 3  Dietary changes: no    Health status changes: no    Bleeding/hemorrhagic complications: no    Recent/future hospitalizations: no    Any changes in medication regimen? no    Recent/future dental: no  Any missed doses?: no       Is patient compliant with meds? yes       Allergies: 1)  ! Codeine 2)  ! Penicillin  Anticoagulation Management History:      The patient is taking warfarin and comes in today for a routine follow up visit.  Positive risk factors for bleeding include an age of 75 years or older and history of CVA/TIA.  The bleeding index is 'intermediate risk'.  Positive CHADS2 values include History of HTN, Age > 75 years old, and Prior Stroke/CVA/TIA.  The start date was 02/13/2001.  Her last INR was 2.0 ratio.  Anticoagulation responsible provider: Graciela Husbands MD, Viviann Spare.  INR POC: 1.9.  Cuvette Lot#: [id number].  Exp: 11/2011.    Anticoagulation Management Assessment/Plan:      The patient's current anticoagulation dose is Coumadin 5 mg tabs: Take by mouth as directed.  The target INR is 2.3-3.0.  The next INR is due 12/29/2010.  Anticoagulation instructions were given to patient/daughter.  Results were reviewed/authorized by Windell Hummingbird, RN.  She was notified by Windell Hummingbird, RN.         Prior Anticoagulation Instructions: INR 2.4 The patient is to continue with the same dose of coumadin.  This dosage includes:  Take 1 tablet (5mg ) on Mon, Fri Take 0.5 tablet (2.5mg ) on the rest of the days Recheck INR in 4 weeks on Jan. 24th  Current Anticoagulation Instructions: INR 1.9  Take 1 tablet today.  Then continue with 1/2 tablet every day except 1 tablet on Monday and Friday. Recheck in 3 weeks

## 2010-12-17 NOTE — Medication Information (Signed)
Summary: rov/sp  Anticoagulant Therapy  Managed by: Bayard Hugger, PharmD Referring MD: Valera Castle MD PCP: Shaune Pollack Supervising MD: Jens Som MD, Arlys John Indication 1: Atrial Fibrillation (ICD-427.31) Lab Used[facility]  Site: [facility] INR POC 2.4 INR RANGE 2.3 - 3  Dietary changes: no    Health status changes: no    Bleeding/hemorrhagic complications: no    Recent/future hospitalizations: no    Any changes in medication regimen? no    Recent/future dental: no  Any missed doses?: no       Is patient compliant with meds? yes       Allergies: 1)  ! Codeine 2)  ! Penicillin  Anticoagulation Management History:      Positive risk factors for bleeding include an age of 75 years or older and history of CVA/TIA.  The bleeding index is 'intermediate risk'.  Positive CHADS2 values include History of HTN, Age > 9 years old, and Prior Stroke/CVA/TIA.  The start date was 02/13/2001.  Her last INR was 2.0 ratio.  Anticoagulation responsible provider: Jens Som MD, Arlys John.  INR POC: 2.4.  Cuvette Lot#: [id number].  Exp: 12/2011.    Anticoagulation Management Assessment/Plan:      The patient's current anticoagulation dose is Coumadin 5 mg tabs: Take by mouth as directed.  The target INR is 2.3-3.0.  The next INR is due 12/08/2010.  Anticoagulation instructions were given to patient/daughter.  Results were reviewed/authorized by Bayard Hugger, PharmD.         Prior Anticoagulation Instructions: INR 2.8  Continue same dose of 1/2 tablet every day except 1 tablet on Monday and Friday.  Recheck INR in 4 weeks.   Current Anticoagulation Instructions: INR 2.4 The patient is to continue with the same dose of coumadin.  This dosage includes:  Take 1 tablet (5mg ) on Mon, Fri Take 0.5 tablet (2.5mg ) on the rest of the days Recheck INR in 4 weeks on Jan. 24th

## 2010-12-23 NOTE — Letter (Signed)
Summary: TEE Instructions  [facility], Main Office  [address]   [place], [place] [address]   Phone: [phone]  Fax: [phone]      TEE Instructions  12/16/2010 MRN: [id number]  [name] [address] [place], [place]  [address]      You are scheduled for a TEE on February 28,2012with Dr. Charlton Haws.  Please arrive at the [facility] at 12:00 p.m. on the day of your procedure.  1)   Diet:     A)   Nothing to eat or drink after midnight except your medications with        a sip of water until 7am the morning of your procedure.  2)  Must have a responsible person to drive you home.  3)   Bring your current insurance cards and current list of all your medications. 4)        Your are scheduled for lab work on February 14,2012-- same day as your              Coumadin clinic appt.  *Special Note:  Every effort is made to have your procedure done on time.  Occasionally there are emergencies that present themselves at the hospital that may cause delays.  Please be patient if a delay does occur.  *If you have any questions after you get home, please call the office at [phone].

## 2010-12-29 ENCOUNTER — Other Ambulatory Visit: Payer: Self-pay | Admitting: Cardiology

## 2010-12-29 ENCOUNTER — Encounter: Payer: Self-pay | Admitting: Cardiology

## 2010-12-29 ENCOUNTER — Encounter (INDEPENDENT_AMBULATORY_CARE_PROVIDER_SITE_OTHER): Payer: Medicare Other

## 2010-12-29 ENCOUNTER — Other Ambulatory Visit (INDEPENDENT_AMBULATORY_CARE_PROVIDER_SITE_OTHER): Payer: Medicare Other

## 2010-12-29 DIAGNOSIS — Z7901 Long term (current) use of anticoagulants: Secondary | ICD-10-CM

## 2010-12-29 DIAGNOSIS — I359 Nonrheumatic aortic valve disorder, unspecified: Secondary | ICD-10-CM

## 2010-12-29 DIAGNOSIS — I2510 Atherosclerotic heart disease of native coronary artery without angina pectoris: Secondary | ICD-10-CM

## 2010-12-29 DIAGNOSIS — Z79899 Other long term (current) drug therapy: Secondary | ICD-10-CM

## 2010-12-29 DIAGNOSIS — I4891 Unspecified atrial fibrillation: Secondary | ICD-10-CM

## 2010-12-29 LAB — CBC WITH DIFFERENTIAL/PLATELET
Basophils Relative: 0.5 % (ref 0.0–3.0)
Eosinophils Relative: 1.9 % (ref 0.0–5.0)
Lymphocytes Relative: 24.2 % (ref 12.0–46.0)
MCV: 92.8 fl (ref 78.0–100.0)
Monocytes Absolute: 0.8 10*3/uL (ref 0.1–1.0)
Neutrophils Relative %: 63.5 % (ref 43.0–77.0)
RBC: 3.75 Mil/uL — ABNORMAL LOW (ref 3.87–5.11)
WBC: 7.9 10*3/uL (ref 4.5–10.5)

## 2010-12-29 LAB — BASIC METABOLIC PANEL
Chloride: 106 mEq/L (ref 96–112)
Creatinine, Ser: 1.1 mg/dL (ref 0.4–1.2)
Sodium: 142 mEq/L (ref 135–145)

## 2011-01-06 NOTE — Medication Information (Signed)
Summary: Coumadin Clinic  Anticoagulant Therapy  Managed by: Bethena Midget, RN, BSN Referring MD: Valera Castle MD PCP: Shaune Pollack Supervising MD: Jens Som MD, Arlys John Indication 1: Atrial Fibrillation (ICD-427.31) Lab Used: [facility] Site: [facility] INR POC 2.9 INR RANGE 2.3 - 3  Dietary changes: no    Health status changes: yes       Details: Sore throat and cough   Bleeding/hemorrhagic complications: no    Recent/future hospitalizations: no    Any changes in medication regimen? yes       Details: Took Z-Pak from 1/30 to 2/3   Recent/future dental: no  Any missed doses?: no       Is patient compliant with meds? yes       Allergies: 1)  ! Codeine 2)  ! Penicillin  Anticoagulation Management History:      The patient is taking warfarin and comes in today for a routine follow up visit.  Positive risk factors for bleeding include an age of 75 years or older and history of CVA/TIA.  The bleeding index is 'intermediate risk'.  Positive CHADS2 values include History of HTN, Age > 75 years old, and Prior Stroke/CVA/TIA.  The start date was 02/13/2001.  Her last INR was 2.0 ratio.  Anticoagulation responsible provider: Jens Som MD, Arlys John.  INR POC: 2.9.  Cuvette Lot#: [id number].  Exp: 11/2011.    Anticoagulation Management Assessment/Plan:      The patient's current anticoagulation dose is Coumadin 5 mg tabs: Take by mouth as directed.  The target INR is 2.3-3.0.  The next INR is due 01/26/2011.  Anticoagulation instructions were given to patient/daughter.  Results were reviewed/authorized by Bethena Midget, RN, BSN.  She was notified by Bethena Midget, RN, BSN.         Prior Anticoagulation Instructions: INR 1.9  Take 1 tablet today.  Then continue with 1/2 tablet every day except 1 tablet on Monday and Friday. Recheck in 3 weeks  Current Anticoagulation Instructions: INR 2.9 Continue 1/2 pill everyday except 1 pill on Mondays and Fridays. Recheck in 4 weeks.

## 2011-01-12 ENCOUNTER — Encounter: Payer: Self-pay | Admitting: Cardiovascular Disease

## 2011-01-12 ENCOUNTER — Ambulatory Visit (HOSPITAL_COMMUNITY)
Admission: RE | Admit: 2011-01-12 | Discharge: 2011-01-12 | Disposition: A | Discharge status: Home / Self Care | Payer: Medicare Other | Source: Ambulatory Visit | Attending: Cardiovascular Disease | Admitting: Cardiovascular Disease

## 2011-01-12 DIAGNOSIS — I359 Nonrheumatic aortic valve disorder, unspecified: Secondary | ICD-10-CM

## 2011-01-13 ENCOUNTER — Encounter: Payer: Self-pay | Admitting: Cardiology

## 2011-01-13 DIAGNOSIS — I359 Nonrheumatic aortic valve disorder, unspecified: Secondary | ICD-10-CM | POA: Insufficient documentation

## 2011-01-13 DIAGNOSIS — Z954 Presence of other heart-valve replacement: Secondary | ICD-10-CM

## 2011-01-13 DIAGNOSIS — I6350 Cerebral infarction due to unspecified occlusion or stenosis of unspecified cerebral artery: Secondary | ICD-10-CM

## 2011-01-19 ENCOUNTER — Telehealth: Payer: Self-pay | Admitting: Cardiology

## 2011-01-22 NOTE — Consult Note (Signed)
  NAMEALITA, WALDREN                ACCOUNT NO.:  [id number]  MEDICAL RECORD NO.:  [id number]           PATIENT TYPE:  O  LOCATION:  [facility]                         FACILITY:  [facility]  PHYSICIAN:  Peter C. Eden Emms, MD, FACCDATE OF BIRTH:  Jun 22, 1926  DATE OF CONSULTATION:  01/12/2011 DATE OF DISCHARGE:                                CONSULTATION   INDICATION:  Prosthetic aortic valve with aortic insufficiency, question perivalvular leak.  The patient was sedated with 25 mcg of fentanyl and 5 mg of Versed. Using digital technique, an Omniplane probe was advanced into distal esophagus without incident.  There was moderate left ventricular cavity enlargement.  There was septal and apical akinesis.  The ejection fraction was in the 45% range. There was no mural apical thrombus.  There was moderate left atrial enlargement.  There was significant mitral annular calcification with moderate mitral insufficiency.  There was restricted motion of the posterior leaflet.  The patient had a St. Jude mechanical aortic valve.  Leaflet motion was normal.  There was no significant obstruction.  There was a 4-mm perivalvular leak.  The perivalvular leak was best seen in the along axis parasternal type 2.  It resulted in moderate aortic insufficiency that coursed along the anterior mitral leaflet eccentrically.  The valve was stable.  There was no evidence of rocking or significant dehiscence. There was no vegetation, pannus, or thrombus.  The aortic root was normal.  There was no evidence of dissection or atheroma.  Right-sided cardiac chambers were normal.  There was mild tricuspid insufficiency and mild right atrial enlargement.  There was no left atrial appendage clot.  No atrial septal defect.  FINAL IMPRESSION: 1. St. Jude aortic valve with no obstruction and normal leaflet     motion, moderate perivalvular aortic insufficiency involving a 4-mm     tract inferiorly near the end of intervalvular  fibrosis anterior     leaflet of mitral valve.  Valve itself was stable with no pannus or     thrombus formation and normal aortic root. 2. Moderate mitral insufficiency with mitral annular calcification. 3. Moderate left ventricular cavity enlargement with an ejection     fraction of 45%, septal and apical wall motion abnormalities. 4. Moderate left atrial enlargement. 5. Mild right atrial enlargement. 6. Mild tricuspid insufficiency. 7. No significant aortic debris. 8. No atrial septal defect or patent foramen ovale. 9. No left atrial appendage clot.  The patient tolerated the procedure well.  Given her fairly asymptomatic status and advanced age, she would appear not to need redo valve surgery.  Good control of her blood pressure and diuretics as needed should be all that is needed as well as SBE prophylaxis.     Noralyn Pick. Eden Emms, MD, [facility]     PCN/MEDQ  D:  01/12/2011  T:  01/13/2011  Job:  [id number]  cc:   Jesse Sans. Daleen Squibb, MD, [facility]  Electronically Signed by Charlton Haws MD [facility] on 01/21/2011 09:08:01 AM

## 2011-01-26 ENCOUNTER — Encounter: Payer: Self-pay | Admitting: Cardiology

## 2011-01-26 ENCOUNTER — Encounter (INDEPENDENT_AMBULATORY_CARE_PROVIDER_SITE_OTHER): Payer: Medicare Other

## 2011-01-26 DIAGNOSIS — Z7901 Long term (current) use of anticoagulants: Secondary | ICD-10-CM

## 2011-01-26 DIAGNOSIS — I4891 Unspecified atrial fibrillation: Secondary | ICD-10-CM

## 2011-01-26 NOTE — Progress Notes (Signed)
Summary: pt would like results ot TEE  Phone Note Call from Patient Call back at Home Phone [phone]   Caller: Daughter/[name] Montez Morita Reason for Call: Talk to Nurse, Talk to Doctor Summary of Call: pt would like to hear about the TEE results cause they haven't heard anything from Dr. Daleen Squibb yet. if you can't reach patient please call daughter [name] at [phone] or [phone] Initial call taken by: Omer Jack,  January 19, 2011 3:25 PM  Follow-up for Phone Call        Spoke with [name]. All questions answered. Continue medical therapy. Follow-up by: Gaylord Shih, MD, [facility],  January 20, 2011 2:19 PM

## 2011-01-26 NOTE — Consult Note (Signed)
Summary: [facility] Consultation Report  [facility] Consultation Report   Imported By: Earl Many 01/21/2011 10:59:58  _____________________________________________________________________  External Attachment:    Type:   Image     Comment:   External Document

## 2011-02-02 NOTE — Medication Information (Signed)
Summary: rov/tm  Anticoagulant Therapy  Managed by: Bethena Midget, RN, BSN Referring MD: Valera Castle MD PCP: Shaune Pollack Supervising MD: Myrtis Ser MD, Tinnie Gens Indication 1: Atrial Fibrillation (ICD-427.31) Lab Used: [facility] Site: [facility] INR POC 2.7 INR RANGE 2.3 - 3  Dietary changes: no    Health status changes: no    Bleeding/hemorrhagic complications: no    Recent/future hospitalizations: no    Any changes in medication regimen? no    Recent/future dental: no  Any missed doses?: no       Is patient compliant with meds? yes       Allergies: 1)  ! Codeine 2)  ! Penicillin  Anticoagulation Management History:      The patient is taking warfarin and comes in today for a routine follow up visit.  Positive risk factors for bleeding include an age of 40 years or older and history of CVA/TIA.  The bleeding index is 'intermediate risk'.  Positive CHADS2 values include History of HTN, Age > 66 years old, and Prior Stroke/CVA/TIA.  The start date was 02/13/2001.  Her last INR was 2.0 ratio.  Anticoagulation responsible provider: Myrtis Ser MD, Tinnie Gens.  INR POC: 2.7.  Cuvette Lot#: [id number].  Exp: 01/2012.    Anticoagulation Management Assessment/Plan:      The patient's current anticoagulation dose is Coumadin 5 mg tabs: Take by mouth as directed.  The target INR is 2.3-3.0.  The next INR is due 02/23/2011.  Anticoagulation instructions were given to patient/daughter.  Results were reviewed/authorized by Bethena Midget, RN, BSN.  She was notified by Bethena Midget, RN, BSN.         Prior Anticoagulation Instructions: INR 2.9 Continue 1/2 pill everyday except 1 pill on Mondays and Fridays. Recheck in 4 weeks.   Current Anticoagulation Instructions: INR 2.7 Continue 1/2 pill everyday except 1 pill on Mondays and Fridays. Recheck in 4 weeks.

## 2011-02-23 ENCOUNTER — Ambulatory Visit (INDEPENDENT_AMBULATORY_CARE_PROVIDER_SITE_OTHER): Payer: Medicare Other | Admitting: *Deleted

## 2011-02-23 DIAGNOSIS — I6350 Cerebral infarction due to unspecified occlusion or stenosis of unspecified cerebral artery: Secondary | ICD-10-CM

## 2011-02-23 DIAGNOSIS — I359 Nonrheumatic aortic valve disorder, unspecified: Secondary | ICD-10-CM

## 2011-02-23 DIAGNOSIS — Z954 Presence of other heart-valve replacement: Secondary | ICD-10-CM

## 2011-02-23 LAB — POCT INR: INR: 2.6

## 2011-03-12 ENCOUNTER — Other Ambulatory Visit: Payer: Self-pay | Admitting: *Deleted

## 2011-03-12 MED ORDER — METOPROLOL TARTRATE 50 MG PO TABS: 50.0000 mg | ORAL_TABLET | Freq: Two times a day (BID) | ORAL | Status: DC

## 2011-03-16 ENCOUNTER — Other Ambulatory Visit: Payer: Self-pay | Admitting: Cardiology

## 2011-03-23 ENCOUNTER — Ambulatory Visit (INDEPENDENT_AMBULATORY_CARE_PROVIDER_SITE_OTHER): Payer: Medicare Other | Admitting: *Deleted

## 2011-03-23 ENCOUNTER — Other Ambulatory Visit: Payer: Self-pay | Admitting: Cardiology

## 2011-03-23 DIAGNOSIS — I359 Nonrheumatic aortic valve disorder, unspecified: Secondary | ICD-10-CM

## 2011-03-23 DIAGNOSIS — I6350 Cerebral infarction due to unspecified occlusion or stenosis of unspecified cerebral artery: Secondary | ICD-10-CM

## 2011-03-23 DIAGNOSIS — Z954 Presence of other heart-valve replacement: Secondary | ICD-10-CM

## 2011-03-23 LAB — POCT INR: INR: 2.5

## 2011-03-30 NOTE — Assessment & Plan Note (Signed)
[facility]                            CARDIOLOGY OFFICE NOTE   NAME:[name], [name]                      MRN:          [id number]  DATE:11/21/2007                            DOB:          [birth year]    [name] returns today for further management following issues.  1. Coronary disease status post coronary bypass grafting 1997.  She      had a negative stress echo June 15, 2006.  No ischemia, normal LV      function.  She has had no anginal equivalents.  She has a chronic      dyspnea on exertion which has not changed.  2. History of aortic valve replacement.  Stable echo August 2007.  3. Hypertension.  4. Hyperlipidemia.  Her blood work was checked July 2008 and was      stable.  Her LDL is 78, HDL chronically low at 38.8.  Total      cholesterol 137, triglycerides 103.  5. Anticoagulation.  6. Left bundle branch block.  7. Cerebrovascular disease.  She had nonobstructive carotid disease on      June 15, 2006.  She has had no symptoms of TIAs.   MEDICATIONS:  1. Coumadin.  2. Lopressor 50 mg b.i.d.  3. Lasix 20 mg a day.  4. Potassium 10 mg daily.  5. Elavil 10 mg.  6. Lotrel 5/20 daily.  7. Vytorin 10/40 daily.   EXAM:  Her blood pressure is 152/62 which is unusual.  She is under a  lot of stress with husband in the shape he is in.  He and her daughter  in the room as well.  Her pulse is 64 and regular.  Weight is 146.  HEENT:  Unchanged.  Carotid upstrokes were equal bilaterally with bilateral bruits.  Thyroid  is not enlarged.  Trachea is midline.  There is no JVD.  Lungs were clear.  No rales.  HEART:  Reveals a nondisplaced PMI.  She has normal S1-S2 with a soft  systolic murmur.  S2 splits.  There may be a soft diastolic murmur along  the left sternal border.  ABDOMEN:  Soft, good bowel sounds.  EXTREMITIES:  No edema.  Pulses are intact.  NEURO:  Exam is intact.   EKG shows sinus brady with a left bundle.   ASSESSMENT/PLAN:  [name] is doing well.  Will arrange for her to  have carotid Dopplers and I will see her back in July.  At that time she  will need blood work as well as the follow up objective assessment of  coronary artery disease and aortic valve replacement.   All questions were answered.     Thomas C. Daleen Squibb, MD, [facility]  Electronically Signed    TCW/MedQ  DD: 11/21/2007  DT: 11/21/2007  Job #: [id number]

## 2011-03-30 NOTE — Assessment & Plan Note (Signed)
[facility]                            CARDIOLOGY OFFICE NOTE   NAME:[name], [name]                      MRN:          [id number]  DATE:06/14/2007                            DOB:          1926-11-13    [name] returns today for further management of the following  issues:  1. Coronary artery disease status post coronary artery bypass grafting      1997.  She had a negative stress echo June 15, 2006.  This was      negative for ischemia.  She has normal left ventricular systolic      function.  2. History of aortic valve disease status post aortic valve      replacement.  Her aortic valve was stable on the last echo last      summer.  3. Hypertension.  4. Hyperlipidemia.  She is due blood work.  5. Anticoagulation followed in [facility].  6. Left bundle branch block.  7. Cerebral vascular disease.  Carotid Doppler today showed stable      carotid disease with 40% to 59% bilateral internal carotid stenosis      with bilateral antegrade flow in her vertebrals.   She still has dyspnea on exertion, which is not changed.  She is still  quite active exercising.   MEDICATIONS:  1. Coumadin as directed.  2. Lopressor 50 b.i.d.  3. Lasix 20 mg a day.  4. Potassium 10 mEq a day.  5. Elavil 10 mg a day.  6. Lotrel 5/20 daily.  7. Vytorin 10/40 daily.   Her blood pressure today is 160/71.  I rechecked it with a large cuff in  the left arm, and it was 132/55.  Pulse 71 and regular.  Weight is 150  and stable.  Carotids are full with bilateral bruits.  There is no JVD.  Thyroid is  not enlarged.  Trachea is midline.  LUNGS:  Clear.  HEART:  Reveals a prosthetic S2.  There is no gallop.  ABDOMINAL EXAM:  Soft with good bowel sounds.  EXTREMITIES:  No edema.  Pulses are intact.  NEUROLOGIC:  Exam is intact.   ASSESSMENT:  [name] is doing well.  We will send her to the lab  for lipids, LFTs, chem. 7, CBC on Coumadin.  Assuming  these are stable,  I will see her back in 6 months.     Thomas C. Daleen Squibb, MD, [facility]  Electronically Signed    TCW/MedQ  DD: 06/14/2007  DT: 06/15/2007  Job #: [id number]   cc:   Ramon Dredge L. [name], M.D.

## 2011-03-30 NOTE — Assessment & Plan Note (Signed)
[facility]                            CARDIOLOGY OFFICE NOTE   [name], [name]                       MRN:          [id number]  DATE:12/18/2008                            DOB:          July 29, 1926    [name] comes in today for followup.  Unfortunately, she lost her  husband in October.  Her daughter [name] comes in with her today.  It has  been a very stressful year and time for her.   Unfortunately, she has had another amnesia spell in fact she has had 1  about every other month since last March when she had initial event.  Please see my previous notes for the evaluation as well as the  evaluation by Dr. Avie Echevaria.   We stopped her Vytorin hoping this would reduce some.  Obviously has  not.   MEDICATIONS:  1. Coumadin as directed.  2. Lopressor 50 b.i.d.  3. Lasix 20 mg a day.  4. Potassium 10 mEq a day.  5. Lotrel 5/20 daily.  6. Aspirin 81 mg a day.  7. Vytorin 10/40 which is currently on hold.  8. Amitriptyline 10 mg nightly.   She has the following cardiac and cardiovascular issues,  1. Coronary artery disease, status post coronary bypass graft in      1997.  She had a negative stress dobutamine echo, August 2007.  She      is having no angina.  2. History of aortic valve replacement stable echo, April 17, 2008.      Please refer to the report.  3. Hypertension.  4. Chronic left bundle-branch block.  5. Hyperlipidemia.  6. Cerebrovascular disease.  She had nonobstructive carotid disease      last spring when Dr. Sandria Manly saw her for her amnesia spells.   There is no focal neurological complaints.  She occasionally has a  headache, but this is not associated with the events.  She has had no  nausea and vomiting.  She denies any angina.   PHYSICAL EXAMINATION:  GENERAL:  She is very pleasant as always.  She is  alert and oriented.  She does not recall the event on January 6.  VITAL SIGNS:  Her blood pressure is 140/52, her pulse is  55 and regular.  She is in sinus brady confirmed by EKG shows an incomplete right bundle.  Weight is 131 which is actually down about 7 pounds since August.  HEENT:  Unchanged.  Nonfocal exam.  Neurologically, carotids are full  with bilateral bruits.  Thyroid is not enlarged.  Trachea is midline.  LUNGS:  Clear to auscultation and percussion.  HEART:  Reveals a prosthetic S2.  No significant murmur, except for a  soft systolic flow murmur.  ABDOMEN:  Normal.  No midline bruit.  No hepatomegaly.  EXTREMITIES:  No cyanosis, clubbing, or edema.  Venous varicosity  present.  There is no sign of DVT.  NEURO:  Grossly intact.  SKIN:  Shows few ecchymoses.   ASSESSMENT/PLAN:  Mr. 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  I have called Dr. Sandria Manly  who has reviewed her file.  He  will start for CT with contrast to make sure nothing changed since last  March.  I will start her Vytorin back since this is obviously not  related to the spells.  I will see her back in about 3 months.   I spent about 30 minutes or greater with her daughter and her and also  calling Dr. Sandria Manly.     [name] Squibb, MD, [name]  Electronically Signed    TCW/MedQ  DD: 12/18/2008  DT: 12/19/2008  Job #: [id number]   cc:   Evie Lacks, MD  Oneal Deputy [name], M.D.

## 2011-03-30 NOTE — Assessment & Plan Note (Signed)
[facility]                            CARDIOLOGY OFFICE NOTE   CHERILYNN, SCHOMBURG                       MRN:          [id number]  DATE:02/06/2008                            DOB:          21-Jul-1926    HISTORY:  [name] comes in today as an add on because of an episode  of confusion last Monday.   She woke up Monday morning and did not realize she had a doctor's  appointment and argued vehemently that she did not when told by her  daughter she did and just could not remember anything.  In fact, she  remembers nothing of the event.  She was evaluated by Dr. Shaune Pollack who  is arranging a neurological consultation.  He also told her that when  she had a spell, to take an aspirin.  She is on Coumadin and her INR  apparently was 2.7 last week.   PROBLEM LIST:  1. She has coronary disease status post coronary bypass grafting in      1997.  She had a negative stress echo June 15, 2006.  Her last      Myoview did not show any ischemia, normal LV function.  She has had      no anginal equivalents.  She does have chronic dyspnea on exertion      which has not changed.  2. History of aortic valve replacement, stable echo August 2007.  3. Hypertension.  4. Chronic left bundle branch block.  5. Hyperlipidemia.  6. Cerebrovascular disease.  She has nonobstructive carotid disease      last Doppler of June 15, 2006.  She has had no previous symptoms      of TIAs.   When we heard this story, we obtained carotid Dopplers.  She has stable  plaque in both carotids 40-59%.  She has antegrade flow in both  vertebrals.   She denies any focal neurological symptoms.  She had no headache.  She  did have some nausea during the episode.  She has had no vomiting.  She  denies any fever, chills, neck rigidity.  She has had no painful digits  or abdominal pain.   MEDICATIONS:  1. Coumadin as directed.  2. Lopressor 50 b.i.d.  3. Lasix 20 mg a day.  4.  Potassium 10 mEq a day.  5. Elavil 10 mg a day.  6. Lotrel 5/20 daily.  7. Vytorin 10/40 q.h.s.   PHYSICAL EXAMINATION:  VITAL SIGNS:  Blood pressure is 143/56, her pulse  is 75 and regular.  She has a left bundle branch block, normal sinus  rhythm.  Her weight is 144.  HEENT:  Normocephalic, atraumatic.  PERRLA.  Extraocular movements are  intact.  Sclerae are clear.  Cranial nerves II-XII are intact.  GENERAL:  She looks her baseline self.  She is alert and oriented x3.  SKIN:  Warm and dry.  NECK:  Supple.  Carotids reveal bilateral soft bruits.  Thyroid is not  enlarged.  Trachea is midline.  LUNGS:  Clear except reduced breath sounds throughout.  HEART:  Reveals  a prosthetic heart sound, regular rate and rhythm.  No  gallop.  ABDOMEN:  Soft, good bowel sounds.  No midline bruit.  EXTREMITIES:  No sinus clubbing or edema.  Pulses are intact.  NEURO:  Intact grossly to me.  Her gait is normal.   ASSESSMENT:  It sounds as though [name] had a global amnesia  attack.  She is totally back to baseline and has been since last Monday.  It does not sound like an embolic event and certainly does not sound  like an isolated carotid or vertebral event.   I have talked to Dr. Melbourne Abts of [facility].  He is happy to see  her tomorrow at 2 p.m.  In the meantime, I have put her on aspirin 81 mg  a day in addition to her Coumadin.  I plan on seeing her back in about 4  weeks for careful followup.     Thomas C. Daleen Squibb, MD, [facility]  Electronically Signed    TCW/MedQ  DD: 02/06/2008  DT: 02/06/2008  Job #: [id number]   cc:   Evie Lacks, MD  Oneal Deputy [name], M.D.

## 2011-03-30 NOTE — Assessment & Plan Note (Signed)
[name]                            CARDIOLOGY OFFICE NOTE   [name]                       MRN:          [id number]  DATE:04/10/2008                            DOB:          10-30-1926    [name] returns today for followup.  Please see my note from February 06, 2008 about her episode of confusion.   She saw Dr. Melbourne Abts who had reviewed a CT scan of the head, which was  negative.  She could not have an MRI because of a prosthetic valve.  He  performed an EEG, which was negative.  He wanted to do a sleep-deprived  EEG but the patient did not go through with it.   He felt this was a global amnesia attack.  She has done well until  yesterday when she had another spell.  She ended up going to the grocery  store, writing 2 checks, and does not even remember it.  These seem to  be precipitated by nausea and just not feeling well the morning she  wakes up.   Her problem list is given on February 06, 2008.   We did carotid Dopplers in March 2009 when she had her first spell.  She  had nonobstructive disease with antegrade flow in both vertebral.   We added aspirin 81 mg a day.   She is still on Vytorin 10/40 and her blood work including a  comprehensive metabolic panel, lipid panel are unremarkable.  She had  been on Elavil for years 10 mg nightly for neuropathy.  She does not  think it helps.  Her only other medicines are Coumadin with a  therapeutic INR, Lopressor, Lasix and potassium and Lotrel.   She denies any focal neurological symptoms.  She has had no orthopnea,  PND or peripheral edema.  She has had no tachy palpitations.  She has  had no angina or ischemic equivalents.   Her blood pressure today is 146/61, her pulse is 64 and regular.  Weight  is 143.  HEENT:  Normocephalic, atraumatic.  PERRL.  Extraocular is intact.  Sclerae are clear.  Facial symmetry is normal.  Dentition satisfactory.  She wears glasses.  Neck is  supple.  Carotid upstrokes are equal bilaterally with soft  carotid bruits.  Thyroid is not enlarged.  Trachea is midline.  LUNGS:  Clear.  HEART:  Reveals a prosthetic S1.  She has a diastolic murmur along the  left sternal border that is more prominent.  There is no gallop.  She  has a paradoxically split S2.  Abdominal exam is soft, good bowel sounds.  No midline bruits.  There is no sinus clubbing or edema.  Pulses are intact.  NEURO:  Exam is intact.   ASSESSMENT/PLAN:  [name] probably has had another global amnesia  attack.  She now is willing to undergo a sleep deprived EEG and to  follow up with Dr. Sandria Manly.  Will make those arrangements.   In addition, we will stop her Vytorin and Elavil to see if we can clear  the muddy waters.  I will see her back in 3 months.     She has some dysuria today.  Will obtain a UA for her convenience.     Thomas C. Daleen Squibb, MD, [facility]  Electronically Signed    TCW/MedQ  DD: 04/10/2008  DT: 04/10/2008  Job #: [id number]   cc:   Colleen Lacks, MD  [name], M.D.

## 2011-03-30 NOTE — Assessment & Plan Note (Signed)
[facility]                            CARDIOLOGY OFFICE NOTE   [name], [name]                       MRN:          [id number]  DATE:07/11/2008                            DOB:          13-Apr-1926    [name] returns today.  She has had only 1 episode of confusion that  was centered around stress with her husband's illness in July.   We stopped her Vytorin and her Elavil.  She states she feels no  different off these medications.  She did not feel that Elavil was  helping her in anyway.   We followed up carotid Dopplers once again in July.  This showed no  progression of her nonobstructive carotid plaque and antegrade flow in  both vertebrals.  She had a negative 2-D echo that was stable on April 17, 2008.   A sleep-deprived EEG per her daughter was negative for seizures with Dr.  Sandria Manly.   She looks good today.  She offers no complaints.   MEDICATIONS:  1. Coumadin as directed.  2. Lopressor 50 b.i.d.  3. Lasix 20 mg a day.  4. Potassium 10 mEq a day.  5. Lotrel 5/20 daily.  6. Enteric-coated aspirin 81 mg a day.   PHYSICAL EXAMINATION:  VITAL SIGNS:  Blood pressure today is 140/52, her  pulse is 72 and regular, weight is 138.  HEENT:  Unchanged.  NECK:  Bilateral carotid bruits.  Thyroid is not enlarged.  Trachea is  midline.  LUNGS:  Clear.  HEART:  Reveals a prosthetic S1 and S2.  No gallop, no rub, no murmur.  ABDOMEN:  Soft.  Good bowel sounds.  No pulsatile mass.  EXTREMITIES:  No edema.  Pulses are intact.  NEURO:  Intact.  She is alert and oriented x3.  Affect is normal.  She  is emotional about her husband's illness.   ASSESSMENT AND PLAN:  [name] is stable from our standpoint.  We  will continue the current medications, but add Vytorin for secondary  prevention.  I do not think this is related to her amnesia spells.  I  will see her back again in 6 months.     Thomas C. Daleen Squibb, MD, [facility]  Electronically  Signed    TCW/MedQ  DD: 07/11/2008  DT: 07/12/2008  Job #: [id number]   cc:   Evie Lacks, MD  Oneal Deputy [name], M.D.

## 2011-04-02 NOTE — Procedures (Signed)
   NAMEOtho Simpson                         ACCOUNT NO.:  [id number]   MEDICAL RECORD NO.:  [id number]                   PATIENT TYPE:  OUT   LOCATION:  RAD                                  FACILITY:  [facility]   PHYSICIAN:  Maisie Fus C. Wall, M.D. [facility]            DATE OF BIRTH:  1926/02/03   DATE OF PROCEDURE:  DATE OF DISCHARGE:  06/14/2002                                    STRESS TEST   BRIEF HISTORY:  [name] is a very pleasant 77 year old female with a  history of Saint Jude aortic valve replacement as well as CABG x 3 in 1997.  She has a remote tobacco history, hypertension, elevated lipids, left bundle  branch block and is on Coumadin therapy. She was seen recently by Dr. Daleen Squibb  and reported some dyspnea. She was scheduled for a dobutamine stress echo to  further evaluate her symptoms.   Prior to the study, the patient reported feeling fine. She has had some  dyspnea recently but did not feel short of breath on the day of these  studies. She did, however, take her beta blocker as no one had instructed  her not to take this medication.   Her baseline EKG showed a left bundle branch block, rate 67 beats per  minute. She does have a previous history of left bundle branch block. Her  baseline blood pressure is 168/78. Her target heart rate is 124.   The patient was given dobutamine at 10 mcg, increased to 20, 30 and then 40  per protocol. She was then given atropine 0.5 mg as she had still not  reached her target heart rate with the atropine. At approximately 13 minutes  and 6 seconds into the study, she reached her target heart rate of 124 beats  per minute. At that time, her blood pressure was 192/70. The ECHO images  were obtained. The dobutamine was discontinued. Her heart rate gradually  came down as did her blood pressure. There were no EKG changes except for an  occasional PVC. The patient did feel jittery and had some fullness in her  chest but did not have any  significant dyspnea during the study. The echo  images are currently pending at this time.     DAVID RIMEHULS, P.A.                      Thomas C. Daleen Squibb, M.D. [name]    DR/MEDQ  D:  06/14/2002  T:  06/20/2002  Job:  [id number]   cc:   [name], M.D.

## 2011-04-02 NOTE — Procedures (Signed)
   NAMEOtho Bellows                         ACCOUNT NO.:  [id number]   MEDICAL RECORD NO.:  [id number]                   PATIENT TYPE:  OUT   LOCATION:  RAD                                  FACILITY:  [facility]   PHYSICIAN:  Maisie Fus C. Wall, M.D.                DATE OF BIRTH:  1926/02/09   DATE OF PROCEDURE:  06/07/2003  DATE OF DISCHARGE:  06/07/2003                                  ECHOCARDIOGRAM   INDICATIONS:  History of coronary artery bypass grafting, aortic valve  replacement in 1997.   Dobutamine was infused per protocol.  Baseline EKG is left bundle branch  block pattern.  Target heart rate was achieved with the maximum rate of 123  beats/minute.   Baseline images showed abnormal septal wall motion from previous bypass and  left bundle branch block pattern.  At peak infusion of dobutamine, there was  augmentation of all myocardial walls.  There was no evidence,  echocardiographically, of ischemia.  The aortic valve appeared to be stable  as well, but definitive dynamics were not done.   CONCLUSION:  Negative adequate dobutamine stress  echocardiogram.                                               Maisie Fus C. Wall, M.D.    TCW/MEDQ  D:  06/12/2003  T:  06/12/2003  Job:  [id number]   cc:   Ramon Dredge L. [name], M.D.  [address]  [place]  [place] [address]  Fax: [phone]

## 2011-04-02 NOTE — Procedures (Signed)
   NAMEOtho Simpson                         ACCOUNT NO.:  [id number]   MEDICAL RECORD NO.:  [id number]                   PATIENT TYPE:  OUT   LOCATION:  RAD                                  FACILITY:  [facility]   PHYSICIAN:  Vida Roller, M.D.                DATE OF BIRTH:  [birth year]   DATE OF PROCEDURE:  DATE OF DISCHARGE:                                  ECHOCARDIOGRAM   PROCEDURE:  Dobutamine echocardiogram   INDICATIONS:  Ms. Ellithorpe is a 77 year old female with known coronary  artery disease, status post coronary artery bypass grafting in 1997 with a  negative stress dobutamine echocardiogram in July 2003. She is also status  post aortic valve replacement.  She has complaints of increased dyspnea in  the morning. She denies any chest discomfort.   BASELINE DATA:  EKG shows sinus rhythm at 63 beats/minute with a left bundle  branch pattern.  Blood pressure is 158/72.   Dobutamine was infused up to 40 mcg, addition of leg lifts, and 0.5 mg of  atropine was given to reach her maximum heart rate of 123 beats/minute.  This is 85% of predicted maximum.  Her maximal blood pressure was 160/78.  EKG showed no ischemic changes and a few PVCs.  She maintained a left bundle  branch throughout the test.  The patient denied any chest discomfort or  shortness of breath with infusion of dobutamine.   Stress images were obtained at heart rate of 80 beats/minute, 123  beats/minute, and 110 beats/minute.   Final results are pending MD review.     Amy Mercy Riding, P.A. [facility]                     Vida Roller, M.D.    AB/MEDQ  D:  06/07/2003  T:  06/07/2003  Job:  [id number]

## 2011-04-02 NOTE — Procedures (Signed)
[facility]  Patient:    [name] Visit Number: [id number] MRN: [id number]          Service Type: OUT Location: RAD Attending Physician:  Cain Sieve Dictated by:   [place] Bing, M.D. Proc. Date: 05/08/02 Admit Date:  05/08/2002                              Echocardiograms  REFERRING PHYSICIAN:  Kari Baars, M.D. and Valera Castle, M.D.  CLINICAL DATA:  A 77 year old woman with previous aortic valve replacement surgery plus CABG, now with dyspnea on exertion.  1. Technically adequate echocardiographic study. 2. Mild left atrial enlargement, normal right atrium and right ventricle. 3. Mechanical valve in the aortic position with good disk motion, normal    transvalvular gradient for a St. Jude type valve.  Mild aortic    insufficiency is present with the appearance of a paraprosthetic leak. 4. The mitral valve is normal with mild annular calcification and mild to    moderate regurgitation. 5. Normal tricuspid valve with physiologic regurgitation.  Estimated RV    systolic pressure is at the upper limit of normal to mildly elevated. 6. Normal pulmonic valve. 7. Left ventricular size at the upper limit of normal.  Borderline LVH most    prominent in the proximal septum.  Regional and global LV systolic function    are normal. 8. Normal IVC. 9. Comparison with prior study of May 16, 2001:  No significant interval    change. Dictated by:   [place] Bing, M.D. Attending Physician:  Cain Sieve DD:  05/08/02 TD:  05/09/02 Job: [id number]

## 2011-04-02 NOTE — Assessment & Plan Note (Signed)
[facility]                              CARDIOLOGY OFFICE NOTE   LAYKIN, RAINONE                   MRN:          [id number]  DATE:  05/25/2006                              DOB:      04/06/1926    [name] returns today for followup.  Please see the note from 05/04/05  for a problem list.   She is due a stress echo and follow up of her aortic valve as well.  She is  also due carotid Dopplers.   She is doing well.  She has had baseline dyspnea on exertion that is no  worse.  She is still very active exercising.  Her INR today is 3.   Her meds are unchanged since last visit.  Lipids in July 2006 were at goal  except for her HDL being low.  She is due lipids.   EXAM:  VITAL SIGNS:  Blood pressure is 160/75.  Pulse is 92 and regular.  She says it is normally better than this.  Her weight is 151.  NECK:  Carotids are full with soft systolic sounds bilaterally.  Thyroid is  not enlarged.  Trachea is midline.  LUNGS:  Clear.  HEART:  Reveals a regular rate and rhythm with normal S1 and S2.  There is  no diastolic component.  ABDOMINAL EXAM:  Soft with good bowel sounds.  There is no midline bruit.  There is no hepatomegaly.  EXTREMITIES:  Reveals no cyanosis, clubbing or edema.  Pulses are brisk.   [name] is doing well.  I have scheduled her for a dobutamine  echocardiogram to assess her aortic valve and rule out any ischemia.  We  will hold her Lopressor that day.  Will also obtain carotid Dopplers.   Please note that her valve is a #19 St. Jude valve.                               Thomas C. Daleen Squibb, MD, [facility]    TCW/MedQ  DD:  05/25/2006  DT:  05/25/2006  Job #:  [id number]   cc:   Ramon Dredge L. [name], MD

## 2011-04-20 ENCOUNTER — Ambulatory Visit (INDEPENDENT_AMBULATORY_CARE_PROVIDER_SITE_OTHER): Payer: Medicare Other | Admitting: *Deleted

## 2011-04-20 DIAGNOSIS — Z954 Presence of other heart-valve replacement: Secondary | ICD-10-CM

## 2011-04-20 DIAGNOSIS — I6350 Cerebral infarction due to unspecified occlusion or stenosis of unspecified cerebral artery: Secondary | ICD-10-CM

## 2011-04-20 DIAGNOSIS — I359 Nonrheumatic aortic valve disorder, unspecified: Secondary | ICD-10-CM

## 2011-04-20 LAB — POCT INR: INR: 2.8

## 2011-05-18 ENCOUNTER — Ambulatory Visit (INDEPENDENT_AMBULATORY_CARE_PROVIDER_SITE_OTHER): Payer: Medicare Other | Admitting: *Deleted

## 2011-05-18 DIAGNOSIS — I359 Nonrheumatic aortic valve disorder, unspecified: Secondary | ICD-10-CM

## 2011-05-18 DIAGNOSIS — Z954 Presence of other heart-valve replacement: Secondary | ICD-10-CM

## 2011-05-18 DIAGNOSIS — I6350 Cerebral infarction due to unspecified occlusion or stenosis of unspecified cerebral artery: Secondary | ICD-10-CM

## 2011-05-18 LAB — POCT INR: INR: 2.6

## 2011-06-01 ENCOUNTER — Other Ambulatory Visit: Payer: Self-pay | Admitting: Cardiology

## 2011-06-03 ENCOUNTER — Encounter: Payer: Self-pay | Admitting: Cardiology

## 2011-06-07 ENCOUNTER — Ambulatory Visit (INDEPENDENT_AMBULATORY_CARE_PROVIDER_SITE_OTHER): Payer: Medicare Other | Admitting: *Deleted

## 2011-06-07 ENCOUNTER — Ambulatory Visit (INDEPENDENT_AMBULATORY_CARE_PROVIDER_SITE_OTHER): Payer: Medicare Other | Admitting: Cardiology

## 2011-06-07 ENCOUNTER — Encounter: Payer: Self-pay | Admitting: Cardiology

## 2011-06-07 DIAGNOSIS — Z954 Presence of other heart-valve replacement: Secondary | ICD-10-CM

## 2011-06-07 DIAGNOSIS — I359 Nonrheumatic aortic valve disorder, unspecified: Secondary | ICD-10-CM

## 2011-06-07 DIAGNOSIS — I6529 Occlusion and stenosis of unspecified carotid artery: Secondary | ICD-10-CM

## 2011-06-07 DIAGNOSIS — I25810 Atherosclerosis of coronary artery bypass graft(s) without angina pectoris: Secondary | ICD-10-CM

## 2011-06-07 DIAGNOSIS — I447 Left bundle-branch block, unspecified: Secondary | ICD-10-CM

## 2011-06-07 DIAGNOSIS — I6350 Cerebral infarction due to unspecified occlusion or stenosis of unspecified cerebral artery: Secondary | ICD-10-CM

## 2011-06-07 NOTE — Patient Instructions (Addendum)
Your physician recommends that you schedule a follow-up appointment in: 6 months with Dr. Wall  Your physician has requested that you have a carotid duplex. This test is an ultrasound of the carotid arteries in your neck. It looks at blood flow through these arteries that supply the brain with blood. Allow one hour for this exam. There are no restrictions or special instructions.    

## 2011-06-07 NOTE — Assessment & Plan Note (Signed)
Stable. No change in treatment. 

## 2011-06-07 NOTE — Assessment & Plan Note (Signed)
Stable with no change on exam. Echocardiogram shows moderate to severe aortic insufficiency. She is asymptomatic. Continue medical management with good blood pressure control.

## 2011-06-07 NOTE — Progress Notes (Signed)
HPI [name] returns for evaluation and management of her coronary artery disease, history of aortic valve replacement with moderate to severe aortic insufficiency by TEE in January 2012, history of carotid artery stenosis, chronic left bundle branch block, and anticoagulation.  She is doing remarkably well and continues to exercise at the gym 3 days a week. She's had no symptoms of angina or ischemia. She denies any symptoms of TIAs or mini strokes. She denies orthopnea, PND or peripheral edema.  She just started prednisone taper for some arthritis. Her INR is 2.6 today.  She denies any bleeding or trauma.  Her EKG today shows  Sinus bradycardia with a left bundle branch block which is stable. Past Medical History  Diagnosis Date  . Encounter for long-term (current) use of other medications   . Carotid artery stenosis     Without infarction  . Stroke   . Hypertension     Unspecified  . Coronary artery disease   . Hypercholesterolemia     Pure  . Mixed hyperlipidemia   . LBBB (left bundle branch block)     Past Surgical History  Procedure Date  . Aortic valve replacement   . Coronary artery bypass graft     No family history on file.  History   Social History  . Marital Status: Widowed    Spouse Name: N/A    Number of Children: N/A  . Years of Education: N/A   Occupational History  . Not on file.   Social History Main Topics  . Smoking status: Former Games developer  . Smokeless tobacco: Not on file   Comment: Tobacco use-no  . Alcohol Use: Not on file  . Drug Use: Not on file  . Sexually Active: Not on file   Other Topics Concern  . Not on file   Social History Narrative  . No narrative on file    Allergies  Allergen Reactions  . Codeine   . Penicillins     Current Outpatient Prescriptions  Medication Sig Dispense Refill  . amitriptyline (ELAVIL) 10 MG tablet Take 10 mg by mouth at bedtime.        Marland Kitchen aspirin 81 MG EC tablet Take 81 mg by mouth daily.         Marland Kitchen donepezil (ARICEPT ODT) 10 MG disintegrating tablet Take 10 mg by mouth daily.        . ergocalciferol (VITAMIN D2) 50000 UNITS capsule Take 50,000 Units by mouth once a week.        Marland Kitchen LASIX 20 MG tablet TAKE ONE TABLET BY MOUTH ONCE DAILY.  30 each  11  . LIPITOR 40 MG tablet TAKE ONE TABLET DAILY.  30 each  6  . LOTREL 5-20 MG per capsule TAKE ONE CAPSULE DAILY.  30 each  6  . Melatonin 3 MG TABS Take 3 mg by mouth at bedtime.        . metoprolol (LOPRESSOR) 50 MG tablet Take 1 tablet (50 mg total) by mouth 2 (two) times daily.  60 tablet  11  . Multiple Vitamins-Minerals (OCUVITE EXTRA) TABS Take 1 tablet by mouth daily.        . potassium chloride (KLOR-CON) 10 MEQ CR tablet Take 10 mEq by mouth daily.        . predniSONE (DELTASONE) 10 MG tablet Take 10 mg by mouth as directed.        . warfarin (COUMADIN) 5 MG tablet Take by mouth as directed.        Marland Kitchen  zolpidem (AMBIEN) 5 MG tablet Take 5 mg by mouth at bedtime.          ROS Negative other than HPI.   PE HPI  Past Medical History  Diagnosis Date  . Encounter for long-term (current) use of other medications   . Carotid artery stenosis     Without infarction  . Stroke   . Hypertension     Unspecified  . Coronary artery disease   . Hypercholesterolemia     Pure  . Mixed hyperlipidemia   . LBBB (left bundle branch block)     Past Surgical History  Procedure Date  . Aortic valve replacement   . Coronary artery bypass graft     No family history on file.  History   Social History  . Marital Status: Widowed    Spouse Name: N/A    Number of Children: N/A  . Years of Education: N/A   Occupational History  . Not on file.   Social History Main Topics  . Smoking status: Former Games developer  . Smokeless tobacco: Not on file   Comment: Tobacco use-no  . Alcohol Use: Not on file  . Drug Use: Not on file  . Sexually Active: Not on file   Other Topics Concern  . Not on file   Social History Narrative  . No  narrative on file    Allergies  Allergen Reactions  . Codeine   . Penicillins     Current Outpatient Prescriptions  Medication Sig Dispense Refill  . amitriptyline (ELAVIL) 10 MG tablet Take 10 mg by mouth at bedtime.        Marland Kitchen aspirin 81 MG EC tablet Take 81 mg by mouth daily.        Marland Kitchen donepezil (ARICEPT ODT) 10 MG disintegrating tablet Take 10 mg by mouth daily.        . ergocalciferol (VITAMIN D2) 50000 UNITS capsule Take 50,000 Units by mouth once a week.        Marland Kitchen LASIX 20 MG tablet TAKE ONE TABLET BY MOUTH ONCE DAILY.  30 each  11  . LIPITOR 40 MG tablet TAKE ONE TABLET DAILY.  30 each  6  . LOTREL 5-20 MG per capsule TAKE ONE CAPSULE DAILY.  30 each  6  . Melatonin 3 MG TABS Take 3 mg by mouth at bedtime.        . metoprolol (LOPRESSOR) 50 MG tablet Take 1 tablet (50 mg total) by mouth 2 (two) times daily.  60 tablet  11  . Multiple Vitamins-Minerals (OCUVITE EXTRA) TABS Take 1 tablet by mouth daily.        . potassium chloride (KLOR-CON) 10 MEQ CR tablet Take 10 mEq by mouth daily.        . predniSONE (DELTASONE) 10 MG tablet Take 10 mg by mouth as directed.        . warfarin (COUMADIN) 5 MG tablet Take by mouth as directed.        . zolpidem (AMBIEN) 5 MG tablet Take 5 mg by mouth at bedtime.          ROS Negative other than HPI.   PE General Appearance: well developed, well nourished in no acute distress HEENT: symmetrical face, PERRLA, good dentition  Neck: no JVD, thyromegaly, or adenopathy, trachea midline Chest: symmetric without deformity Cardiac: PMI non-displaced, RRR, normal S1, prosthetic and paradoxical S2, soft diastolic murmur along left sternal border, soft systolic murmur at the apex. Lung: clear to ausculation and  percussion Vascular: all pulses full without bruits  Abdominal: nondistended, nontender, good bowel sounds, no HSM, no bruits Extremities: no cyanosis, clubbing or edema, no sign of DVT, no varicosities  Skin: normal color, no rashes Neuro:  alert and oriented x 3, non-focal Pysch: normal affect Filed Vitals:   06/07/11 1058  BP: 143/58  Pulse: 56  Resp: 14  Height: 5\' 5"  (1.651 m)  Weight: 119 lb (53.978 kg)    EKG  Labs and Studies Reviewed.   Lab Results  Component Value Date   WBC 7.9 12/29/2010   HGB 12.1 12/29/2010   HCT 34.8* 12/29/2010   MCV 92.8 12/29/2010   PLT 253.0 12/29/2010      Chemistry      Component Value Date/Time   NA 142 12/29/2010 0953   K 4.0 12/29/2010 0953   CL 106 12/29/2010 0953   CO2 31 12/29/2010 0953   BUN 16 12/29/2010 0953   CREATININE 1.1 12/29/2010 0953      Component Value Date/Time   CALCIUM 9.4 12/29/2010 0953   ALKPHOS 85 06/16/2010 0936   AST 27 06/16/2010 0936   ALT 16 06/16/2010 0936   BILITOT 0.7 06/16/2010 0936       Lab Results  Component Value Date   CHOL 146 06/16/2010   CHOL 167 05/01/2010   CHOL 186 02/09/2010   Lab Results  Component Value Date   HDL 54.20 06/16/2010   HDL 60.00 05/01/2010   HDL 16.10 02/09/2010   Lab Results  Component Value Date   LDLCALC 80 06/16/2010   LDLCALC 86 05/01/2010   LDLCALC 93 02/09/2010   Lab Results  Component Value Date   TRIG 61.0 06/16/2010   TRIG 103.0 05/01/2010   TRIG 174.0* 02/09/2010   Lab Results  Component Value Date   CHOLHDL 3 06/16/2010   CHOLHDL 3 05/01/2010   CHOLHDL 3 02/09/2010   No results found for this basename: HGBA1C   Lab Results  Component Value Date   ALT 16 06/16/2010   AST 27 06/16/2010   ALKPHOS 85 06/16/2010   BILITOT 0.7 06/16/2010   No results found for this basename: TSH   Filed Vitals:   06/07/11 1058  BP: 143/58  Pulse: 56  Resp: 14  Height: 5\' 5"  (1.651 m)  Weight: 119 lb (53.978 kg)    EKG  Labs and Studies Reviewed.   Lab Results  Component Value Date   WBC 7.9 12/29/2010   HGB 12.1 12/29/2010   HCT 34.8* 12/29/2010   MCV 92.8 12/29/2010   PLT 253.0 12/29/2010      Chemistry      Component Value Date/Time   NA 142 12/29/2010 0953   K 4.0 12/29/2010 0953   CL 106 12/29/2010 0953    CO2 31 12/29/2010 0953   BUN 16 12/29/2010 0953   CREATININE 1.1 12/29/2010 0953      Component Value Date/Time   CALCIUM 9.4 12/29/2010 0953   ALKPHOS 85 06/16/2010 0936   AST 27 06/16/2010 0936   ALT 16 06/16/2010 0936   BILITOT 0.7 06/16/2010 0936       Lab Results  Component Value Date   CHOL 146 06/16/2010   CHOL 167 05/01/2010   CHOL 186 02/09/2010   Lab Results  Component Value Date   HDL 54.20 06/16/2010   HDL 60.00 05/01/2010   HDL 96.04 02/09/2010   Lab Results  Component Value Date   LDLCALC 80 06/16/2010   LDLCALC 86 05/01/2010   LDLCALC  93 02/09/2010   Lab Results  Component Value Date   TRIG 61.0 06/16/2010   TRIG 103.0 05/01/2010   TRIG 174.0* 02/09/2010   Lab Results  Component Value Date   CHOLHDL 3 06/16/2010   CHOLHDL 3 05/01/2010   CHOLHDL 3 02/09/2010   No results found for this basename: HGBA1C   Lab Results  Component Value Date   ALT 16 06/16/2010   AST 27 06/16/2010   ALKPHOS 85 06/16/2010   BILITOT 0.7 06/16/2010   No results found for this basename: TSH

## 2011-06-07 NOTE — Assessment & Plan Note (Signed)
Last carotid Dopplers in May of 2011. Repeat studies. No change in treatment since asymptomatic.

## 2011-06-08 ENCOUNTER — Encounter: Payer: Self-pay | Admitting: Cardiology

## 2011-06-08 ENCOUNTER — Telehealth: Payer: Self-pay | Admitting: *Deleted

## 2011-06-08 NOTE — Telephone Encounter (Signed)
Family history updated. Mylo Red RN

## 2011-06-08 NOTE — Telephone Encounter (Signed)
I spoke with daughter, [name], and I have scheduled pt for carotid dopplers on 06/22/11 Mylo Red RN

## 2011-06-15 ENCOUNTER — Telehealth: Payer: Self-pay | Admitting: Cardiology

## 2011-06-15 NOTE — Telephone Encounter (Signed)
I spoke with daughter, [name], after talking with Misty Stanley our pharmacist here about contraindications with any of pt heart medications. No contraindications with heart medications. Pt will begin Lyrica this week and is aware it can cause dizziness and drowsiness. Pt wil be taking this medication at bedtime. Mylo Red RN

## 2011-06-15 NOTE — Telephone Encounter (Signed)
Per pt daughter call, Dr. Hilda Lias gave pt lyrica 75 mg for neuropathy and daughter wants to make sure if pt takes this that it will not effect any other medications pt is taking. Dr. Hilda Lias stated that the only concern he has is with the combination of ambien and lyrica stating that the combination might make pt sleepy.  Please return call to pt daughter to advise. Please call daughter home if she doesn't answer cell:  [phone]

## 2011-06-21 ENCOUNTER — Other Ambulatory Visit: Payer: Self-pay | Admitting: Cardiology

## 2011-06-22 ENCOUNTER — Encounter (INDEPENDENT_AMBULATORY_CARE_PROVIDER_SITE_OTHER): Payer: Medicare Other | Admitting: *Deleted

## 2011-06-22 ENCOUNTER — Ambulatory Visit (INDEPENDENT_AMBULATORY_CARE_PROVIDER_SITE_OTHER): Payer: Medicare Other | Admitting: *Deleted

## 2011-06-22 DIAGNOSIS — Z954 Presence of other heart-valve replacement: Secondary | ICD-10-CM

## 2011-06-22 DIAGNOSIS — I359 Nonrheumatic aortic valve disorder, unspecified: Secondary | ICD-10-CM

## 2011-06-22 DIAGNOSIS — I6350 Cerebral infarction due to unspecified occlusion or stenosis of unspecified cerebral artery: Secondary | ICD-10-CM

## 2011-06-22 DIAGNOSIS — I6529 Occlusion and stenosis of unspecified carotid artery: Secondary | ICD-10-CM

## 2011-06-22 LAB — POCT INR: INR: 2.6

## 2011-06-24 ENCOUNTER — Encounter: Payer: Self-pay | Admitting: Cardiology

## 2011-06-25 ENCOUNTER — Telehealth: Payer: Self-pay | Admitting: *Deleted

## 2011-06-25 NOTE — Telephone Encounter (Signed)
Daughter, [name], is aware of carotid results.  Mylo Red RN

## 2011-07-05 ENCOUNTER — Encounter: Payer: Medicare Other | Admitting: *Deleted

## 2011-07-14 ENCOUNTER — Ambulatory Visit (INDEPENDENT_AMBULATORY_CARE_PROVIDER_SITE_OTHER): Payer: Medicare Other | Admitting: *Deleted

## 2011-07-14 DIAGNOSIS — I6350 Cerebral infarction due to unspecified occlusion or stenosis of unspecified cerebral artery: Secondary | ICD-10-CM

## 2011-07-14 DIAGNOSIS — I359 Nonrheumatic aortic valve disorder, unspecified: Secondary | ICD-10-CM

## 2011-07-14 DIAGNOSIS — Z954 Presence of other heart-valve replacement: Secondary | ICD-10-CM

## 2011-07-30 ENCOUNTER — Ambulatory Visit (INDEPENDENT_AMBULATORY_CARE_PROVIDER_SITE_OTHER): Payer: Medicare Other | Admitting: *Deleted

## 2011-07-30 DIAGNOSIS — I359 Nonrheumatic aortic valve disorder, unspecified: Secondary | ICD-10-CM

## 2011-07-30 DIAGNOSIS — I6350 Cerebral infarction due to unspecified occlusion or stenosis of unspecified cerebral artery: Secondary | ICD-10-CM

## 2011-07-30 DIAGNOSIS — Z954 Presence of other heart-valve replacement: Secondary | ICD-10-CM

## 2011-08-09 LAB — CBC
Hemoglobin: 14.3
Platelets: 367
RDW: 14.2
WBC: 16.5 — ABNORMAL HIGH

## 2011-08-09 LAB — COMPREHENSIVE METABOLIC PANEL
ALT: 42 — ABNORMAL HIGH
Albumin: 4
Alkaline Phosphatase: 94
Chloride: 101
Glucose, Bld: 90
Potassium: 4.1
Sodium: 139
Total Bilirubin: 0.9
Total Protein: 7.5

## 2011-08-09 LAB — URINALYSIS, ROUTINE W REFLEX MICROSCOPIC
Glucose, UA: NEGATIVE
Ketones, ur: NEGATIVE
Protein, ur: NEGATIVE
pH: 5.5

## 2011-08-09 LAB — POCT CARDIAC MARKERS
CKMB, poc: <1 — ABNORMAL LOW
Myoglobin, poc: 50.2
Operator id: 285841
Troponin i, poc: <0.05

## 2011-08-09 LAB — DIFFERENTIAL
Basophils Absolute: 0
Basophils Relative: 0
Eosinophils Absolute: 0
Eosinophils Relative: 0
Neutrophils Relative %: 80 — ABNORMAL HIGH

## 2011-08-09 LAB — PROTIME-INR
INR: 2.4 — ABNORMAL HIGH
Prothrombin Time: 26.9 — ABNORMAL HIGH

## 2011-08-09 LAB — URINE MICROSCOPIC-ADD ON

## 2011-08-24 ENCOUNTER — Ambulatory Visit (INDEPENDENT_AMBULATORY_CARE_PROVIDER_SITE_OTHER): Payer: Medicare Other | Admitting: *Deleted

## 2011-08-24 DIAGNOSIS — Z954 Presence of other heart-valve replacement: Secondary | ICD-10-CM

## 2011-08-24 DIAGNOSIS — I359 Nonrheumatic aortic valve disorder, unspecified: Secondary | ICD-10-CM

## 2011-08-24 DIAGNOSIS — I6350 Cerebral infarction due to unspecified occlusion or stenosis of unspecified cerebral artery: Secondary | ICD-10-CM

## 2011-09-14 ENCOUNTER — Ambulatory Visit (INDEPENDENT_AMBULATORY_CARE_PROVIDER_SITE_OTHER): Payer: Medicare Other | Admitting: *Deleted

## 2011-09-14 DIAGNOSIS — I6350 Cerebral infarction due to unspecified occlusion or stenosis of unspecified cerebral artery: Secondary | ICD-10-CM

## 2011-09-14 DIAGNOSIS — Z954 Presence of other heart-valve replacement: Secondary | ICD-10-CM

## 2011-09-14 DIAGNOSIS — I359 Nonrheumatic aortic valve disorder, unspecified: Secondary | ICD-10-CM

## 2011-09-14 LAB — POCT INR: INR: 2.1

## 2011-09-20 ENCOUNTER — Other Ambulatory Visit: Payer: Self-pay | Admitting: Cardiology

## 2011-10-05 ENCOUNTER — Ambulatory Visit (INDEPENDENT_AMBULATORY_CARE_PROVIDER_SITE_OTHER): Payer: Medicare Other | Admitting: *Deleted

## 2011-10-05 DIAGNOSIS — I359 Nonrheumatic aortic valve disorder, unspecified: Secondary | ICD-10-CM

## 2011-10-05 DIAGNOSIS — Z954 Presence of other heart-valve replacement: Secondary | ICD-10-CM

## 2011-10-05 DIAGNOSIS — I6350 Cerebral infarction due to unspecified occlusion or stenosis of unspecified cerebral artery: Secondary | ICD-10-CM

## 2011-10-05 LAB — POCT INR: INR: 3.2

## 2011-10-26 ENCOUNTER — Ambulatory Visit (INDEPENDENT_AMBULATORY_CARE_PROVIDER_SITE_OTHER): Payer: Medicare Other | Admitting: *Deleted

## 2011-10-26 DIAGNOSIS — I359 Nonrheumatic aortic valve disorder, unspecified: Secondary | ICD-10-CM

## 2011-10-26 DIAGNOSIS — I6350 Cerebral infarction due to unspecified occlusion or stenosis of unspecified cerebral artery: Secondary | ICD-10-CM

## 2011-10-26 DIAGNOSIS — Z954 Presence of other heart-valve replacement: Secondary | ICD-10-CM

## 2011-10-26 LAB — POCT INR: INR: 2.2

## 2011-11-23 ENCOUNTER — Ambulatory Visit (INDEPENDENT_AMBULATORY_CARE_PROVIDER_SITE_OTHER): Payer: Medicare Other | Admitting: *Deleted

## 2011-11-23 DIAGNOSIS — I359 Nonrheumatic aortic valve disorder, unspecified: Secondary | ICD-10-CM | POA: Diagnosis not present

## 2011-11-23 DIAGNOSIS — I6350 Cerebral infarction due to unspecified occlusion or stenosis of unspecified cerebral artery: Secondary | ICD-10-CM | POA: Diagnosis not present

## 2011-11-23 DIAGNOSIS — Z954 Presence of other heart-valve replacement: Secondary | ICD-10-CM

## 2011-11-23 LAB — POCT INR: INR: 2.5

## 2011-11-30 ENCOUNTER — Encounter: Payer: Self-pay | Admitting: Cardiology

## 2011-11-30 ENCOUNTER — Ambulatory Visit (INDEPENDENT_AMBULATORY_CARE_PROVIDER_SITE_OTHER): Payer: Medicare Other | Admitting: Cardiology

## 2011-11-30 VITALS — BP 149/50 | HR 64 | Ht 63.0 in | Wt 115.0 lb

## 2011-11-30 DIAGNOSIS — I359 Nonrheumatic aortic valve disorder, unspecified: Secondary | ICD-10-CM

## 2011-11-30 DIAGNOSIS — I447 Left bundle-branch block, unspecified: Secondary | ICD-10-CM

## 2011-11-30 DIAGNOSIS — I10 Essential (primary) hypertension: Secondary | ICD-10-CM

## 2011-11-30 DIAGNOSIS — I6350 Cerebral infarction due to unspecified occlusion or stenosis of unspecified cerebral artery: Secondary | ICD-10-CM | POA: Diagnosis not present

## 2011-11-30 DIAGNOSIS — I25810 Atherosclerosis of coronary artery bypass graft(s) without angina pectoris: Secondary | ICD-10-CM | POA: Diagnosis not present

## 2011-11-30 DIAGNOSIS — Z954 Presence of other heart-valve replacement: Secondary | ICD-10-CM

## 2011-11-30 DIAGNOSIS — I6529 Occlusion and stenosis of unspecified carotid artery: Secondary | ICD-10-CM

## 2011-11-30 DIAGNOSIS — E785 Hyperlipidemia, unspecified: Secondary | ICD-10-CM

## 2011-11-30 NOTE — Progress Notes (Signed)
HPI [name] comes in today for evaluation and management of her multiple cardiac and vascular issues. She has some intermittent shortness of breath but does not appear to relate to activity. She denies orthopnea PND or edema. She has no palpitations. She denies any bleeding. On January 3, she had another global amnesia attack. No localizing symptoms or signs. Carotid Doppler stable and may of 2012. Echocardiogram has been stable with moderate to severe aortic insufficiency.  Past Medical History  Diagnosis Date  . Encounter for long-term (current) use of other medications   . Carotid artery stenosis     Without infarction  . Stroke   . Hypertension     Unspecified  . Coronary artery disease   . Hypercholesterolemia     Pure  . Mixed hyperlipidemia   . LBBB (left bundle branch block)     Current Outpatient Prescriptions  Medication Sig Dispense Refill  . aspirin 81 MG EC tablet Take 81 mg by mouth daily.        Marland Kitchen COUMADIN 5 MG tablet TAKE AS DIRECTED.  30 each  3  . donepezil (ARICEPT ODT) 10 MG disintegrating tablet Take 10 mg by mouth daily.        . ergocalciferol (VITAMIN D2) 50000 UNITS capsule Take 50,000 Units by mouth once a week.        Marland Kitchen LASIX 20 MG tablet TAKE ONE TABLET BY MOUTH ONCE DAILY.  30 each  11  . LIPITOR 40 MG tablet TAKE ONE TABLET DAILY.  30 each  6  . LOTREL 5-20 MG per capsule TAKE ONE CAPSULE DAILY.  30 each  6  . Melatonin 3 MG TABS Take 3 mg by mouth at bedtime.        . metoprolol (LOPRESSOR) 50 MG tablet Take 1 tablet (50 mg total) by mouth 2 (two) times daily.  60 tablet  11  . Multiple Vitamins-Minerals (OCUVITE EXTRA) TABS Take 1 tablet by mouth daily.        . potassium chloride (K-DUR,KLOR-CON) 10 MEQ tablet TAKE ONE TABLET DAILY.  30 tablet  5  . pregabalin (LYRICA) 75 MG capsule Take 75 mg by mouth every other day. At bedtime         Allergies  Allergen Reactions  . Codeine   . Penicillins     Family History  Problem Relation Age of  Onset  . Heart attack Father   . Stroke Sister     History   Social History  . Marital Status: Widowed    Spouse Name: N/A    Number of Children: N/A  . Years of Education: N/A   Occupational History  . Not on file.   Social History Main Topics  . Smoking status: Former Smoker    Quit date: 11/30/1991  . Smokeless tobacco: Not on file   Comment: Tobacco use-no  . Alcohol Use: Not on file  . Drug Use: Not on file  . Sexually Active: Not on file   Other Topics Concern  . Not on file   Social History Narrative  . No narrative on file    ROS ALL NEGATIVE EXCEPT THOSE NOTED IN HPI  PE  General Appearance: well developed, well nourished in no acute distress,thin HEENT: symmetrical face, PERRLA, good dentition  Neck: no JVD, thyromegaly, or adenopathy, trachea midline Chest: symmetric without deformity Cardiac: PMI non-displaced, RRR, normal S1,prosthetic S2 with 3/6 aortic insufficiency murmur. no gallop  Lung: clear to ausculation and percussion Vascular: all pulses  full without bruits  Abdominal: nondistended, nontender, good bowel sounds, no HSM, no bruits Extremities: no cyanosis, clubbing or edema, no sign of DVT, no varicosities  Skin: normal color, no rashes Neuro: alert and oriented x 3, non-focal Pysch: normal affect  EKG  BMET    Component Value Date/Time   NA 142 12/29/2010 0953   K 4.0 12/29/2010 0953   CL 106 12/29/2010 0953   CO2 31 12/29/2010 0953   GLUCOSE 72 12/29/2010 0953   BUN 16 12/29/2010 0953   CREATININE 1.1 12/29/2010 0953   CALCIUM 9.4 12/29/2010 0953   GFRNONAA 62.20 04/17/2010 1015   GFRAA 62 03/22/2008 1011    Lipid Panel     Component Value Date/Time   CHOL 146 06/16/2010 0936   TRIG 61.0 06/16/2010 0936   HDL 54.20 06/16/2010 0936   CHOLHDL 3 06/16/2010 0936   VLDL 12.2 06/16/2010 0936   LDLCALC 80 06/16/2010 0936    CBC    Component Value Date/Time   WBC 7.9 12/29/2010 0953   RBC 3.75* 12/29/2010 0953   HGB 12.1 12/29/2010 0953   HCT  34.8* 12/29/2010 0953   PLT 253.0 12/29/2010 0953   MCV 92.8 12/29/2010 0953   MCHC 34.7 12/29/2010 0953   RDW 13.7 12/29/2010 0953   LYMPHSABS 1.9 12/29/2010 0953   MONOABS 0.8 12/29/2010 0953   EOSABS 0.2 12/29/2010 0953   BASOSABS 0.0 12/29/2010 1191

## 2011-11-30 NOTE — Assessment & Plan Note (Signed)
Stable. No change in treatment. 

## 2011-11-30 NOTE — Assessment & Plan Note (Signed)
Systolic blood pressures running in the low 140s at times. She tends to get orthostatic and I worry about falls. We'll leave blood pressure current level. Discussed with daughter

## 2011-11-30 NOTE — Assessment & Plan Note (Signed)
Aortic regurgitation is stable clinically and by exam. Will repeat echocardiogram in the future

## 2011-11-30 NOTE — Assessment & Plan Note (Signed)
Stable

## 2011-11-30 NOTE — Patient Instructions (Signed)
Your physician wants you to follow-up in: July 2013.  You will receive a reminder letter in the mail two months in advance. If you don't receive a letter, please call our office to schedule the follow-up appointment.  Your physician has requested that you have a carotid duplex in July 2013. This test is an ultrasound of the carotid arteries in your neck. It looks at blood flow through these arteries that supply the brain with blood. Allow one hour for this exam. There are no restrictions or special instructions.

## 2011-12-21 ENCOUNTER — Ambulatory Visit (INDEPENDENT_AMBULATORY_CARE_PROVIDER_SITE_OTHER): Payer: Medicare Other

## 2011-12-21 DIAGNOSIS — I359 Nonrheumatic aortic valve disorder, unspecified: Secondary | ICD-10-CM | POA: Diagnosis not present

## 2011-12-21 DIAGNOSIS — I6350 Cerebral infarction due to unspecified occlusion or stenosis of unspecified cerebral artery: Secondary | ICD-10-CM | POA: Diagnosis not present

## 2011-12-21 DIAGNOSIS — Z954 Presence of other heart-valve replacement: Secondary | ICD-10-CM | POA: Diagnosis not present

## 2011-12-21 LAB — POCT INR: INR: 2.5

## 2011-12-22 ENCOUNTER — Other Ambulatory Visit: Payer: Self-pay | Admitting: Cardiology

## 2011-12-29 DIAGNOSIS — H353290 Exudative age-related macular degeneration, unspecified eye, stage unspecified: Secondary | ICD-10-CM | POA: Diagnosis not present

## 2011-12-29 DIAGNOSIS — H35059 Retinal neovascularization, unspecified, unspecified eye: Secondary | ICD-10-CM | POA: Diagnosis not present

## 2012-01-28 ENCOUNTER — Other Ambulatory Visit: Payer: Self-pay | Admitting: Cardiology

## 2012-02-01 ENCOUNTER — Ambulatory Visit (INDEPENDENT_AMBULATORY_CARE_PROVIDER_SITE_OTHER): Payer: Medicare Other | Admitting: *Deleted

## 2012-02-01 DIAGNOSIS — Z954 Presence of other heart-valve replacement: Secondary | ICD-10-CM

## 2012-02-01 DIAGNOSIS — I6350 Cerebral infarction due to unspecified occlusion or stenosis of unspecified cerebral artery: Secondary | ICD-10-CM

## 2012-02-01 DIAGNOSIS — I359 Nonrheumatic aortic valve disorder, unspecified: Secondary | ICD-10-CM | POA: Diagnosis not present

## 2012-02-23 DIAGNOSIS — H353290 Exudative age-related macular degeneration, unspecified eye, stage unspecified: Secondary | ICD-10-CM | POA: Diagnosis not present

## 2012-02-23 DIAGNOSIS — H35059 Retinal neovascularization, unspecified, unspecified eye: Secondary | ICD-10-CM | POA: Diagnosis not present

## 2012-03-11 ENCOUNTER — Other Ambulatory Visit: Payer: Self-pay | Admitting: Cardiology

## 2012-03-14 ENCOUNTER — Ambulatory Visit (INDEPENDENT_AMBULATORY_CARE_PROVIDER_SITE_OTHER): Payer: Medicare Other

## 2012-03-14 DIAGNOSIS — I359 Nonrheumatic aortic valve disorder, unspecified: Secondary | ICD-10-CM

## 2012-03-14 DIAGNOSIS — I6350 Cerebral infarction due to unspecified occlusion or stenosis of unspecified cerebral artery: Secondary | ICD-10-CM

## 2012-03-14 DIAGNOSIS — Z954 Presence of other heart-valve replacement: Secondary | ICD-10-CM

## 2012-03-31 ENCOUNTER — Other Ambulatory Visit: Payer: Self-pay | Admitting: Cardiology

## 2012-03-31 NOTE — Telephone Encounter (Signed)
.   Requested Prescriptions   Signed Prescriptions Disp Refills  . COUMADIN 5 MG tablet 30 each 3    Sig: Take as directed by anticoagulation clinic    Authorizing Provider: WALL, Jesse Sans    Ordering User: Migdalia Dk  . LOPRESSOR 50 MG tablet 60 each 6    Sig: TAKE (1) TABLET BY MOUTH TWICE DAILY.    Authorizing Provider: Gaylord Shih    Ordering User: Lacie Scotts

## 2012-04-05 DIAGNOSIS — H353290 Exudative age-related macular degeneration, unspecified eye, stage unspecified: Secondary | ICD-10-CM | POA: Diagnosis not present

## 2012-04-05 DIAGNOSIS — H35059 Retinal neovascularization, unspecified, unspecified eye: Secondary | ICD-10-CM | POA: Diagnosis not present

## 2012-04-15 ENCOUNTER — Other Ambulatory Visit: Payer: Self-pay | Admitting: Cardiology

## 2012-04-18 DIAGNOSIS — G454 Transient global amnesia: Secondary | ICD-10-CM | POA: Diagnosis not present

## 2012-04-26 ENCOUNTER — Ambulatory Visit (INDEPENDENT_AMBULATORY_CARE_PROVIDER_SITE_OTHER): Payer: Medicare Other | Admitting: *Deleted

## 2012-04-26 DIAGNOSIS — I359 Nonrheumatic aortic valve disorder, unspecified: Secondary | ICD-10-CM

## 2012-04-26 DIAGNOSIS — I6350 Cerebral infarction due to unspecified occlusion or stenosis of unspecified cerebral artery: Secondary | ICD-10-CM | POA: Diagnosis not present

## 2012-04-26 DIAGNOSIS — Z954 Presence of other heart-valve replacement: Secondary | ICD-10-CM | POA: Diagnosis not present

## 2012-05-17 DIAGNOSIS — H35359 Cystoid macular degeneration, unspecified eye: Secondary | ICD-10-CM | POA: Diagnosis not present

## 2012-05-17 DIAGNOSIS — H35059 Retinal neovascularization, unspecified, unspecified eye: Secondary | ICD-10-CM | POA: Diagnosis not present

## 2012-05-17 DIAGNOSIS — H353290 Exudative age-related macular degeneration, unspecified eye, stage unspecified: Secondary | ICD-10-CM | POA: Diagnosis not present

## 2012-05-25 ENCOUNTER — Encounter (INDEPENDENT_AMBULATORY_CARE_PROVIDER_SITE_OTHER): Payer: Medicare Other

## 2012-05-25 DIAGNOSIS — I6529 Occlusion and stenosis of unspecified carotid artery: Secondary | ICD-10-CM | POA: Diagnosis not present

## 2012-05-30 ENCOUNTER — Ambulatory Visit (INDEPENDENT_AMBULATORY_CARE_PROVIDER_SITE_OTHER): Payer: Medicare Other | Admitting: *Deleted

## 2012-05-30 ENCOUNTER — Encounter: Payer: Self-pay | Admitting: Cardiology

## 2012-05-30 ENCOUNTER — Ambulatory Visit (INDEPENDENT_AMBULATORY_CARE_PROVIDER_SITE_OTHER): Payer: Medicare Other | Admitting: Cardiology

## 2012-05-30 VITALS — BP 96/36 | HR 47 | Ht 63.0 in | Wt 115.0 lb

## 2012-05-30 DIAGNOSIS — I6529 Occlusion and stenosis of unspecified carotid artery: Secondary | ICD-10-CM

## 2012-05-30 DIAGNOSIS — I447 Left bundle-branch block, unspecified: Secondary | ICD-10-CM

## 2012-05-30 DIAGNOSIS — I6350 Cerebral infarction due to unspecified occlusion or stenosis of unspecified cerebral artery: Secondary | ICD-10-CM | POA: Diagnosis not present

## 2012-05-30 DIAGNOSIS — Z954 Presence of other heart-valve replacement: Secondary | ICD-10-CM

## 2012-05-30 DIAGNOSIS — I10 Essential (primary) hypertension: Secondary | ICD-10-CM | POA: Diagnosis not present

## 2012-05-30 DIAGNOSIS — I25810 Atherosclerosis of coronary artery bypass graft(s) without angina pectoris: Secondary | ICD-10-CM

## 2012-05-30 DIAGNOSIS — I359 Nonrheumatic aortic valve disorder, unspecified: Secondary | ICD-10-CM | POA: Diagnosis not present

## 2012-05-30 LAB — POCT INR: INR: 3.3

## 2012-05-30 MED ORDER — METOPROLOL TARTRATE 50 MG PO TABS: 25.0000 mg | ORAL_TABLET | Freq: Two times a day (BID) | ORAL | Status: DC

## 2012-05-30 NOTE — Patient Instructions (Addendum)
Decrease Lopressor dose to 1/2 tablet twice daily.  Keep a good record of your blood pressure reading.  Goal is to get your systolic greater than 100.  Your physician wants you to follow-up in: 6 months with Dr. Daleen Squibb.  You will receive a reminder letter in the mail two months in advance. If you don't receive a letter, please call our office to schedule the follow-up appointment.

## 2012-05-30 NOTE — Assessment & Plan Note (Signed)
Stable with moderate aortic regurgitation. Continue medical therapy.

## 2012-05-30 NOTE — Assessment & Plan Note (Signed)
Stable. No change in medical therapy other than the decrease of Lopressor.

## 2012-05-30 NOTE — Assessment & Plan Note (Signed)
Her blood pressure and heart rate are low. She is minimally symptomatic. With her left bundle branch block I will decrease her Lopressor to 25 mg twice a day. Goal blood pressures greater than 100 systolic.

## 2012-05-30 NOTE — Assessment & Plan Note (Signed)
No change. Decrease Lopressor.

## 2012-05-30 NOTE — Assessment & Plan Note (Signed)
No significant change. Repeat carotids in 2 years.

## 2012-05-30 NOTE — Progress Notes (Signed)
HPI [name] returns today for evaluation and management of her multiple cardiac and vascular issues.  She's getting lightheaded on occasion when she gets up. She's also had her intermittent shortness of breath which is really unchanged.  She denies any angina or reduction exercise tolerance. She said orthopnea, PND or edema. She has a presyncope or syncope. She has not had any further neurological symptoms of global amnesia since her last visit. She denies any symptoms of TIAs.  Carotid Dopplers recently showed no significant change with 40-59% stenoses bilaterally. She has good antegrade flow in the vertebrals.    Past Medical History  Diagnosis Date  . Encounter for long-term (current) use of other medications   . Carotid artery stenosis     Without infarction  . Stroke   . Hypertension     Unspecified  . Coronary artery disease   . Hypercholesterolemia     Pure  . Mixed hyperlipidemia   . LBBB (left bundle branch block)     Current Outpatient Prescriptions  Medication Sig Dispense Refill  . aspirin 81 MG EC tablet Take 81 mg by mouth daily.        Marland Kitchen COUMADIN 5 MG tablet Take as directed by anticoagulation clinic  30 each  3  . donepezil (ARICEPT ODT) 10 MG disintegrating tablet Take 10 mg by mouth daily.        . ergocalciferol (VITAMIN D2) 50000 UNITS capsule Take 50,000 Units by mouth once a week.        Marland Kitchen LASIX 20 MG tablet TAKE ONE TABLET BY MOUTH ONCE DAILY.  30 each  6  . LIPITOR 40 MG tablet TAKE ONE TABLET DAILY.  30 each  6  . LOPRESSOR 50 MG tablet TAKE (1) TABLET BY MOUTH TWICE DAILY.  60 each  6  . LOTREL 5-20 MG per capsule TAKE ONE CAPSULE DAILY.  30 each  5  . Melatonin 3 MG TABS Take 3 mg by mouth at bedtime.        . Multiple Vitamins-Minerals (OCUVITE EXTRA) TABS Take 1 tablet by mouth daily.        . potassium chloride (K-DUR,KLOR-CON) 10 MEQ tablet TAKE ONE TABLET DAILY.  30 tablet  6  . pregabalin (LYRICA) 75 MG capsule Take 75 mg by mouth every  other day. At bedtime       . ZYMAXID 0.5 % SOLN Place 1 drop into the left eye as needed. Around every 6 weeks for 3 days after Avastin Shot        Allergies  Allergen Reactions  . Codeine   . Penicillins     Family History  Problem Relation Age of Onset  . Heart attack Father   . Stroke Sister     History   Social History  . Marital Status: Widowed    Spouse Name: N/A    Number of Children: N/A  . Years of Education: N/A   Occupational History  . Not on file.   Social History Main Topics  . Smoking status: Former Smoker    Quit date: 11/30/1991  . Smokeless tobacco: Not on file   Comment: Tobacco use-no  . Alcohol Use: Not on file  . Drug Use: Not on file  . Sexually Active: Not on file   Other Topics Concern  . Not on file   Social History Narrative  . No narrative on file    ROS ALL NEGATIVE EXCEPT THOSE NOTED IN HPI  PE  General Appearance:  well developed, well nourished in no acute distress HEENT: symmetrical face, PERRLA, good dentition  Neck: no JVD, thyromegaly, or adenopathy, trachea midline Chest: symmetric without deformity Cardiac: PMI non-displaced, RRR, normal S1, S2, no gallop soft systolic murmur left sternal border, difficult to hear her AI. Lung: clear to ausculation and percussion Vascular: all pulses full without bruits  Abdominal: nondistended, nontender, good bowel sounds, no HSM, no bruits Extremities: no cyanosis, clubbing or edema, no sign of DVT, no varicosities  Skin: normal color, no rashes Neuro: alert and oriented x 3, non-focal Pysch: normal affect  EKG Mark sinus bradycardia, left bundle branch block BMET    Component Value Date/Time   NA 142 12/29/2010 0953   K 4.0 12/29/2010 0953   CL 106 12/29/2010 0953   CO2 31 12/29/2010 0953   GLUCOSE 72 12/29/2010 0953   BUN 16 12/29/2010 0953   CREATININE 1.1 12/29/2010 0953   CALCIUM 9.4 12/29/2010 0953   GFRNONAA 62.20 04/17/2010 1015   GFRAA 62 03/22/2008 1011    Lipid Panel       Component Value Date/Time   CHOL 146 06/16/2010 0936   TRIG 61.0 06/16/2010 0936   HDL 54.20 06/16/2010 0936   CHOLHDL 3 06/16/2010 0936   VLDL 12.2 06/16/2010 0936   LDLCALC 80 06/16/2010 0936    CBC    Component Value Date/Time   WBC 7.9 12/29/2010 0953   RBC 3.75* 12/29/2010 0953   HGB 12.1 12/29/2010 0953   HCT 34.8* 12/29/2010 0953   PLT 253.0 12/29/2010 0953   MCV 92.8 12/29/2010 0953   MCHC 34.7 12/29/2010 0953   RDW 13.7 12/29/2010 0953   LYMPHSABS 1.9 12/29/2010 0953   MONOABS 0.8 12/29/2010 0953   EOSABS 0.2 12/29/2010 0953   BASOSABS 0.0 12/29/2010 1610

## 2012-06-08 ENCOUNTER — Telehealth: Payer: Self-pay | Admitting: Cardiology

## 2012-06-08 NOTE — Telephone Encounter (Signed)
New problem   Patient daughter [name] calling  Lopressor 25 mg 2 times daily.  C/O  X 2 days feet swelling on 7/17 & 7/20 might be due to working corn. Both day blood pressure was fine.  B/p range low 89/45  On 7/18 in am.  B/p range highest  153/58 on 7/21 in pm.

## 2012-06-09 NOTE — Telephone Encounter (Signed)
Daughter, [name], calls today with pt blood pressure readings since lopressor was decreased at last ov on 05/30/12  Date              Am                      pm  7/16                                          108/54 7/17              131/47                 131/49 7/18                89/45                 134/47           More swelling in feet. Stood all day putting up corn 7/19               126/48                147/51           normal 7/20               142/48                142/52           Swelling in feet 7/21               123/52                156/58           normal 7/22               136/49                132/55           normal 7/23               125/43                129/48           normal 7/24               123/47  No shortness of breath or weight gain noted. Is taking all prescribed medications. I will forward to Dr. Daleen Squibb for review.  Mylo Red RN

## 2012-06-20 ENCOUNTER — Ambulatory Visit (INDEPENDENT_AMBULATORY_CARE_PROVIDER_SITE_OTHER): Payer: Medicare Other | Admitting: Pharmacist

## 2012-06-20 DIAGNOSIS — I359 Nonrheumatic aortic valve disorder, unspecified: Secondary | ICD-10-CM | POA: Diagnosis not present

## 2012-06-20 DIAGNOSIS — Z954 Presence of other heart-valve replacement: Secondary | ICD-10-CM | POA: Diagnosis not present

## 2012-06-20 DIAGNOSIS — I6350 Cerebral infarction due to unspecified occlusion or stenosis of unspecified cerebral artery: Secondary | ICD-10-CM

## 2012-06-27 DIAGNOSIS — H353290 Exudative age-related macular degeneration, unspecified eye, stage unspecified: Secondary | ICD-10-CM | POA: Diagnosis not present

## 2012-07-18 ENCOUNTER — Ambulatory Visit (INDEPENDENT_AMBULATORY_CARE_PROVIDER_SITE_OTHER): Payer: Medicare Other | Admitting: *Deleted

## 2012-07-18 ENCOUNTER — Other Ambulatory Visit: Payer: Self-pay | Admitting: Cardiology

## 2012-07-18 DIAGNOSIS — Z954 Presence of other heart-valve replacement: Secondary | ICD-10-CM | POA: Diagnosis not present

## 2012-07-18 DIAGNOSIS — I359 Nonrheumatic aortic valve disorder, unspecified: Secondary | ICD-10-CM | POA: Diagnosis not present

## 2012-07-18 DIAGNOSIS — I6350 Cerebral infarction due to unspecified occlusion or stenosis of unspecified cerebral artery: Secondary | ICD-10-CM | POA: Diagnosis not present

## 2012-07-27 ENCOUNTER — Other Ambulatory Visit: Payer: Self-pay | Admitting: Cardiology

## 2012-08-10 ENCOUNTER — Ambulatory Visit (INDEPENDENT_AMBULATORY_CARE_PROVIDER_SITE_OTHER): Payer: Medicare Other | Admitting: *Deleted

## 2012-08-10 DIAGNOSIS — I359 Nonrheumatic aortic valve disorder, unspecified: Secondary | ICD-10-CM | POA: Diagnosis not present

## 2012-08-10 DIAGNOSIS — Z954 Presence of other heart-valve replacement: Secondary | ICD-10-CM | POA: Diagnosis not present

## 2012-08-10 DIAGNOSIS — I6350 Cerebral infarction due to unspecified occlusion or stenosis of unspecified cerebral artery: Secondary | ICD-10-CM

## 2012-08-10 LAB — POCT INR: INR: 2

## 2012-08-22 ENCOUNTER — Ambulatory Visit (INDEPENDENT_AMBULATORY_CARE_PROVIDER_SITE_OTHER): Payer: Medicare Other | Admitting: *Deleted

## 2012-08-22 DIAGNOSIS — I6350 Cerebral infarction due to unspecified occlusion or stenosis of unspecified cerebral artery: Secondary | ICD-10-CM

## 2012-08-22 DIAGNOSIS — I359 Nonrheumatic aortic valve disorder, unspecified: Secondary | ICD-10-CM | POA: Diagnosis not present

## 2012-08-22 DIAGNOSIS — Z954 Presence of other heart-valve replacement: Secondary | ICD-10-CM | POA: Diagnosis not present

## 2012-08-22 LAB — POCT INR: INR: 2

## 2012-08-23 DIAGNOSIS — H353290 Exudative age-related macular degeneration, unspecified eye, stage unspecified: Secondary | ICD-10-CM | POA: Diagnosis not present

## 2012-08-23 DIAGNOSIS — H35059 Retinal neovascularization, unspecified, unspecified eye: Secondary | ICD-10-CM | POA: Diagnosis not present

## 2012-08-24 DIAGNOSIS — Z23 Encounter for immunization: Secondary | ICD-10-CM | POA: Diagnosis not present

## 2012-09-05 ENCOUNTER — Ambulatory Visit (INDEPENDENT_AMBULATORY_CARE_PROVIDER_SITE_OTHER): Payer: Medicare Other

## 2012-09-05 DIAGNOSIS — I359 Nonrheumatic aortic valve disorder, unspecified: Secondary | ICD-10-CM | POA: Diagnosis not present

## 2012-09-05 DIAGNOSIS — Z954 Presence of other heart-valve replacement: Secondary | ICD-10-CM

## 2012-09-05 DIAGNOSIS — I6350 Cerebral infarction due to unspecified occlusion or stenosis of unspecified cerebral artery: Secondary | ICD-10-CM

## 2012-09-05 LAB — POCT INR: INR: 2.9

## 2012-09-25 ENCOUNTER — Other Ambulatory Visit: Payer: Self-pay | Admitting: *Deleted

## 2012-09-25 MED ORDER — COUMADIN 5 MG PO TABS: ORAL_TABLET | ORAL | Status: DC

## 2012-09-26 ENCOUNTER — Ambulatory Visit (INDEPENDENT_AMBULATORY_CARE_PROVIDER_SITE_OTHER): Payer: Medicare Other | Admitting: Pharmacist

## 2012-09-26 DIAGNOSIS — I6350 Cerebral infarction due to unspecified occlusion or stenosis of unspecified cerebral artery: Secondary | ICD-10-CM | POA: Diagnosis not present

## 2012-09-26 DIAGNOSIS — I359 Nonrheumatic aortic valve disorder, unspecified: Secondary | ICD-10-CM

## 2012-09-26 DIAGNOSIS — Z954 Presence of other heart-valve replacement: Secondary | ICD-10-CM | POA: Diagnosis not present

## 2012-10-11 ENCOUNTER — Other Ambulatory Visit: Payer: Self-pay

## 2012-10-11 MED ORDER — FUROSEMIDE 20 MG PO TABS: 20.0000 mg | ORAL_TABLET | Freq: Every day | ORAL | Status: DC

## 2012-10-17 DIAGNOSIS — G454 Transient global amnesia: Secondary | ICD-10-CM | POA: Diagnosis not present

## 2012-10-17 DIAGNOSIS — H3530 Unspecified macular degeneration: Secondary | ICD-10-CM | POA: Diagnosis not present

## 2012-10-24 ENCOUNTER — Ambulatory Visit (INDEPENDENT_AMBULATORY_CARE_PROVIDER_SITE_OTHER): Payer: Medicare Other

## 2012-10-24 DIAGNOSIS — Z954 Presence of other heart-valve replacement: Secondary | ICD-10-CM

## 2012-10-24 DIAGNOSIS — I6350 Cerebral infarction due to unspecified occlusion or stenosis of unspecified cerebral artery: Secondary | ICD-10-CM

## 2012-10-24 DIAGNOSIS — I359 Nonrheumatic aortic valve disorder, unspecified: Secondary | ICD-10-CM | POA: Diagnosis not present

## 2012-10-24 LAB — POCT INR: INR: 2.5

## 2012-11-01 DIAGNOSIS — H353290 Exudative age-related macular degeneration, unspecified eye, stage unspecified: Secondary | ICD-10-CM | POA: Diagnosis not present

## 2012-11-01 DIAGNOSIS — H35059 Retinal neovascularization, unspecified, unspecified eye: Secondary | ICD-10-CM | POA: Diagnosis not present

## 2012-11-01 DIAGNOSIS — H35359 Cystoid macular degeneration, unspecified eye: Secondary | ICD-10-CM | POA: Diagnosis not present

## 2012-11-14 ENCOUNTER — Other Ambulatory Visit: Payer: Self-pay | Admitting: Cardiology

## 2012-11-14 DIAGNOSIS — J209 Acute bronchitis, unspecified: Secondary | ICD-10-CM | POA: Diagnosis not present

## 2012-11-14 DIAGNOSIS — I259 Chronic ischemic heart disease, unspecified: Secondary | ICD-10-CM | POA: Diagnosis not present

## 2012-11-14 DIAGNOSIS — E785 Hyperlipidemia, unspecified: Secondary | ICD-10-CM | POA: Diagnosis not present

## 2012-11-14 DIAGNOSIS — I10 Essential (primary) hypertension: Secondary | ICD-10-CM | POA: Diagnosis not present

## 2012-11-14 MED ORDER — POTASSIUM CHLORIDE CRYS ER 10 MEQ PO TBCR: 10.0000 meq | EXTENDED_RELEASE_TABLET | Freq: Every day | ORAL | Status: DC

## 2012-11-22 ENCOUNTER — Ambulatory Visit (HOSPITAL_COMMUNITY)
Admission: RE | Admit: 2012-11-22 | Discharge: 2012-11-22 | Disposition: A | Discharge status: Home / Self Care | Payer: Medicare Other | Source: Ambulatory Visit | Attending: Pulmonary Disease | Admitting: Pulmonary Disease

## 2012-11-22 ENCOUNTER — Other Ambulatory Visit (HOSPITAL_COMMUNITY): Payer: Self-pay | Admitting: Pulmonary Disease

## 2012-11-22 DIAGNOSIS — R059 Cough, unspecified: Secondary | ICD-10-CM | POA: Insufficient documentation

## 2012-11-22 DIAGNOSIS — R05 Cough: Secondary | ICD-10-CM | POA: Insufficient documentation

## 2012-11-22 DIAGNOSIS — Z7901 Long term (current) use of anticoagulants: Secondary | ICD-10-CM | POA: Diagnosis not present

## 2012-11-22 DIAGNOSIS — J40 Bronchitis, not specified as acute or chronic: Secondary | ICD-10-CM

## 2012-11-22 DIAGNOSIS — R0602 Shortness of breath: Secondary | ICD-10-CM | POA: Insufficient documentation

## 2012-11-22 DIAGNOSIS — J449 Chronic obstructive pulmonary disease, unspecified: Secondary | ICD-10-CM | POA: Diagnosis not present

## 2012-11-22 DIAGNOSIS — J209 Acute bronchitis, unspecified: Secondary | ICD-10-CM | POA: Diagnosis not present

## 2012-11-22 DIAGNOSIS — J4489 Other specified chronic obstructive pulmonary disease: Secondary | ICD-10-CM | POA: Insufficient documentation

## 2012-11-23 ENCOUNTER — Encounter (HOSPITAL_COMMUNITY): Payer: Self-pay | Admitting: *Deleted

## 2012-11-23 ENCOUNTER — Inpatient Hospital Stay (HOSPITAL_COMMUNITY): Payer: Medicare Other

## 2012-11-23 ENCOUNTER — Inpatient Hospital Stay (HOSPITAL_COMMUNITY)
Admission: AD | Admit: 2012-11-23 | Discharge: 2012-11-29 | DRG: 194 | Disposition: A | Discharge status: Home Health | Payer: Medicare Other | Source: Ambulatory Visit | Attending: Pulmonary Disease | Admitting: Pulmonary Disease

## 2012-11-23 DIAGNOSIS — E7800 Pure hypercholesterolemia, unspecified: Secondary | ICD-10-CM | POA: Diagnosis present

## 2012-11-23 DIAGNOSIS — Z7982 Long term (current) use of aspirin: Secondary | ICD-10-CM | POA: Diagnosis not present

## 2012-11-23 DIAGNOSIS — I509 Heart failure, unspecified: Secondary | ICD-10-CM | POA: Diagnosis present

## 2012-11-23 DIAGNOSIS — I6529 Occlusion and stenosis of unspecified carotid artery: Secondary | ICD-10-CM | POA: Diagnosis present

## 2012-11-23 DIAGNOSIS — IMO0002 Reserved for concepts with insufficient information to code with codable children: Secondary | ICD-10-CM | POA: Diagnosis not present

## 2012-11-23 DIAGNOSIS — E782 Mixed hyperlipidemia: Secondary | ICD-10-CM | POA: Diagnosis present

## 2012-11-23 DIAGNOSIS — E441 Mild protein-calorie malnutrition: Secondary | ICD-10-CM | POA: Diagnosis present

## 2012-11-23 DIAGNOSIS — E860 Dehydration: Secondary | ICD-10-CM | POA: Diagnosis present

## 2012-11-23 DIAGNOSIS — Z87891 Personal history of nicotine dependence: Secondary | ICD-10-CM

## 2012-11-23 DIAGNOSIS — J189 Pneumonia, unspecified organism: Secondary | ICD-10-CM | POA: Diagnosis not present

## 2012-11-23 DIAGNOSIS — D72829 Elevated white blood cell count, unspecified: Secondary | ICD-10-CM | POA: Diagnosis present

## 2012-11-23 DIAGNOSIS — I447 Left bundle-branch block, unspecified: Secondary | ICD-10-CM | POA: Diagnosis present

## 2012-11-23 DIAGNOSIS — F0390 Unspecified dementia without behavioral disturbance: Secondary | ICD-10-CM | POA: Diagnosis present

## 2012-11-23 DIAGNOSIS — E876 Hypokalemia: Secondary | ICD-10-CM | POA: Diagnosis present

## 2012-11-23 DIAGNOSIS — Z952 Presence of prosthetic heart valve: Secondary | ICD-10-CM

## 2012-11-23 DIAGNOSIS — J449 Chronic obstructive pulmonary disease, unspecified: Secondary | ICD-10-CM | POA: Diagnosis not present

## 2012-11-23 DIAGNOSIS — I10 Essential (primary) hypertension: Secondary | ICD-10-CM | POA: Diagnosis present

## 2012-11-23 DIAGNOSIS — Z951 Presence of aortocoronary bypass graft: Secondary | ICD-10-CM | POA: Diagnosis not present

## 2012-11-23 DIAGNOSIS — Z954 Presence of other heart-valve replacement: Secondary | ICD-10-CM

## 2012-11-23 DIAGNOSIS — Z7901 Long term (current) use of anticoagulants: Secondary | ICD-10-CM | POA: Diagnosis not present

## 2012-11-23 DIAGNOSIS — R0602 Shortness of breath: Secondary | ICD-10-CM | POA: Diagnosis not present

## 2012-11-23 DIAGNOSIS — I25810 Atherosclerosis of coronary artery bypass graft(s) without angina pectoris: Secondary | ICD-10-CM | POA: Diagnosis present

## 2012-11-23 DIAGNOSIS — J209 Acute bronchitis, unspecified: Secondary | ICD-10-CM | POA: Diagnosis not present

## 2012-11-23 DIAGNOSIS — Z8673 Personal history of transient ischemic attack (TIA), and cerebral infarction without residual deficits: Secondary | ICD-10-CM | POA: Diagnosis not present

## 2012-11-23 DIAGNOSIS — I2510 Atherosclerotic heart disease of native coronary artery without angina pectoris: Secondary | ICD-10-CM | POA: Diagnosis present

## 2012-11-23 DIAGNOSIS — I739 Peripheral vascular disease, unspecified: Secondary | ICD-10-CM | POA: Diagnosis present

## 2012-11-23 DIAGNOSIS — I6350 Cerebral infarction due to unspecified occlusion or stenosis of unspecified cerebral artery: Secondary | ICD-10-CM | POA: Diagnosis present

## 2012-11-23 DIAGNOSIS — Z79899 Other long term (current) drug therapy: Secondary | ICD-10-CM | POA: Diagnosis not present

## 2012-11-23 LAB — CBC WITH DIFFERENTIAL/PLATELET
Basophils Absolute: 0 10*3/uL (ref 0.0–0.1)
Eosinophils Absolute: 0 10*3/uL (ref 0.0–0.7)
Lymphocytes Relative: 4 % — ABNORMAL LOW (ref 12–46)
Lymphs Abs: 1.3 10*3/uL (ref 0.7–4.0)
MCHC: 33.4 g/dL (ref 30.0–36.0)
MCV: 92.7 fL (ref 78.0–100.0)
Monocytes Relative: 5 % (ref 3–12)
Platelets: 331 10*3/uL (ref 150–400)
RDW: 13.6 % (ref 11.5–15.5)
WBC: 31.9 10*3/uL — ABNORMAL HIGH (ref 4.0–10.5)

## 2012-11-23 LAB — COMPREHENSIVE METABOLIC PANEL
ALT: 23 U/L (ref 0–35)
Alkaline Phosphatase: 99 U/L (ref 39–117)
BUN: 23 mg/dL (ref 6–23)
CO2: 31 mEq/L (ref 19–32)
Chloride: 99 mEq/L (ref 96–112)
GFR calc Af Amer: 49 mL/min — ABNORMAL LOW (ref >90)
Glucose, Bld: 145 mg/dL — ABNORMAL HIGH (ref 70–99)
Potassium: 4.1 mEq/L (ref 3.5–5.1)
Total Bilirubin: 1 mg/dL (ref 0.3–1.2)

## 2012-11-23 LAB — INFLUENZA PANEL BY PCR (TYPE A & B)
H1N1 flu by pcr: NOT DETECTED
Influenza B By PCR: NEGATIVE

## 2012-11-23 LAB — PROTIME-INR: INR: 4.26 — ABNORMAL HIGH (ref 0.00–1.49)

## 2012-11-23 MED ORDER — METOPROLOL TARTRATE 25 MG PO TABS: 25.0000 mg | ORAL_TABLET | Freq: Two times a day (BID) | ORAL | Status: DC

## 2012-11-23 MED ORDER — ATORVASTATIN CALCIUM 40 MG PO TABS
40.0000 mg | ORAL_TABLET | Freq: Every day | ORAL | Status: DC
  Administered 2012-11-25 – 2012-11-28 (×4): 40 mg via ORAL
  Filled 2012-11-23 (×5): qty 1

## 2012-11-23 MED ORDER — POTASSIUM CHLORIDE CRYS ER 10 MEQ PO TBCR
10.0000 meq | EXTENDED_RELEASE_TABLET | Freq: Every day | ORAL | Status: DC
  Administered 2012-11-24 – 2012-11-25 (×2): 10 meq via ORAL
  Filled 2012-11-23 (×2): qty 1

## 2012-11-23 MED ORDER — VANCOMYCIN HCL 500 MG IV SOLR
500.0000 mg | INTRAVENOUS | Status: DC
  Administered 2012-11-23 – 2012-11-26 (×4): 500 mg via INTRAVENOUS
  Filled 2012-11-23 (×4): qty 500

## 2012-11-23 MED ORDER — ONDANSETRON HCL 4 MG/2ML IJ SOLN: 4.0000 mg | Freq: Four times a day (QID) | INTRAMUSCULAR | Status: DC | PRN

## 2012-11-23 MED ORDER — GATIFLOXACIN 0.5 % OP SOLN
1.0000 [drp] | Freq: Four times a day (QID) | OPHTHALMIC | Status: DC
Start: 2012-11-23 — End: 2012-11-26
  Administered 2012-11-24: 1 [drp] via OPHTHALMIC
  Filled 2012-11-23: qty 2.5

## 2012-11-23 MED ORDER — ALUM & MAG HYDROXIDE-SIMETH 200-200-20 MG/5ML PO SUSP: 30.0000 mL | Freq: Four times a day (QID) | ORAL | Status: DC | PRN

## 2012-11-23 MED ORDER — MELATONIN 3 MG PO TABS: 3.0000 mg | ORAL_TABLET | Freq: Every day | ORAL | Status: DC

## 2012-11-23 MED ORDER — LEVOFLOXACIN IN D5W 500 MG/100ML IV SOLN
500.0000 mg | INTRAVENOUS | Status: DC
  Administered 2012-11-23: 500 mg via INTRAVENOUS
  Filled 2012-11-23 (×3): qty 100

## 2012-11-23 MED ORDER — PREGABALIN 75 MG PO CAPS: 75.0000 mg | ORAL_CAPSULE | ORAL | Status: DC

## 2012-11-23 MED ORDER — DONEPEZIL HCL 5 MG PO TABS
10.0000 mg | ORAL_TABLET | Freq: Every day | ORAL | Status: DC
  Administered 2012-11-23 – 2012-11-29 (×7): 10 mg via ORAL
  Filled 2012-11-23 (×3): qty 2
  Filled 2012-11-23: qty 1
  Filled 2012-11-23 (×4): qty 2

## 2012-11-23 MED ORDER — METOPROLOL TARTRATE 25 MG PO TABS
12.5000 mg | ORAL_TABLET | Freq: Two times a day (BID) | ORAL | Status: DC
  Administered 2012-11-23 – 2012-11-29 (×12): 12.5 mg via ORAL
  Filled 2012-11-23 (×12): qty 1

## 2012-11-23 MED ORDER — ACETAMINOPHEN 325 MG PO TABS
650.0000 mg | ORAL_TABLET | Freq: Four times a day (QID) | ORAL | Status: DC | PRN
  Administered 2012-11-23: 650 mg via ORAL
  Filled 2012-11-23: qty 2

## 2012-11-23 MED ORDER — AMLODIPINE BESYLATE 5 MG PO TABS
5.0000 mg | ORAL_TABLET | Freq: Every day | ORAL | Status: DC
  Administered 2012-11-24 – 2012-11-29 (×6): 5 mg via ORAL
  Filled 2012-11-23 (×6): qty 1

## 2012-11-23 MED ORDER — BENAZEPRIL HCL 10 MG PO TABS
20.0000 mg | ORAL_TABLET | Freq: Every day | ORAL | Status: DC
  Administered 2012-11-24 – 2012-11-29 (×6): 20 mg via ORAL
  Filled 2012-11-23 (×6): qty 2

## 2012-11-23 MED ORDER — ACETAMINOPHEN 650 MG RE SUPP: 650.0000 mg | Freq: Four times a day (QID) | RECTAL | Status: DC | PRN

## 2012-11-23 MED ORDER — SODIUM CHLORIDE 0.9 % IV SOLN
INTRAVENOUS | Status: DC
  Administered 2012-11-23 – 2012-11-26 (×7): via INTRAVENOUS
  Administered 2012-11-27: 10 mL via INTRAVENOUS

## 2012-11-23 MED ORDER — BENZONATATE 100 MG PO CAPS
200.0000 mg | ORAL_CAPSULE | Freq: Three times a day (TID) | ORAL | Status: DC | PRN
  Administered 2012-11-23 – 2012-11-29 (×10): 200 mg via ORAL
  Filled 2012-11-23 (×10): qty 2

## 2012-11-23 MED ORDER — AMLODIPINE BESY-BENAZEPRIL HCL 5-20 MG PO CAPS: 1.0000 | ORAL_CAPSULE | Freq: Every day | ORAL | Status: DC

## 2012-11-23 MED ORDER — FUROSEMIDE 20 MG PO TABS
20.0000 mg | ORAL_TABLET | Freq: Every day | ORAL | Status: DC
  Administered 2012-11-24 – 2012-11-29 (×6): 20 mg via ORAL
  Filled 2012-11-23 (×6): qty 1

## 2012-11-23 MED ORDER — ASPIRIN EC 81 MG PO TBEC
81.0000 mg | DELAYED_RELEASE_TABLET | Freq: Every day | ORAL | Status: DC
  Administered 2012-11-23 – 2012-11-29 (×7): 81 mg via ORAL
  Filled 2012-11-23 (×7): qty 1

## 2012-11-23 MED ORDER — SODIUM CHLORIDE 0.9 % IJ SOLN
3.0000 mL | Freq: Two times a day (BID) | INTRAMUSCULAR | Status: DC
  Administered 2012-11-23 – 2012-11-27 (×3): 3 mL via INTRAVENOUS

## 2012-11-23 MED ORDER — GUAIFENESIN-DM 100-10 MG/5ML PO SYRP
5.0000 mL | ORAL_SOLUTION | ORAL | Status: DC | PRN
  Administered 2012-11-23 – 2012-11-29 (×14): 5 mL via ORAL
  Filled 2012-11-23 (×14): qty 5

## 2012-11-23 MED ORDER — VANCOMYCIN HCL 500 MG IV SOLR
INTRAVENOUS | Status: AC
  Filled 2012-11-23: qty 500

## 2012-11-23 MED ORDER — WARFARIN - PHARMACIST DOSING INPATIENT: Freq: Every day | Status: DC

## 2012-11-23 MED ORDER — OCUVITE PO TABS
1.0000 | ORAL_TABLET | Freq: Every day | ORAL | Status: DC
  Administered 2012-11-24 – 2012-11-29 (×6): 1 via ORAL
  Filled 2012-11-23 (×9): qty 1

## 2012-11-23 MED ORDER — ONDANSETRON HCL 4 MG PO TABS
4.0000 mg | ORAL_TABLET | Freq: Four times a day (QID) | ORAL | Status: DC | PRN
  Administered 2012-11-25: 4 mg via ORAL
  Filled 2012-11-23: qty 1

## 2012-11-23 MED ORDER — TRAZODONE HCL 50 MG PO TABS
50.0000 mg | ORAL_TABLET | Freq: Every evening | ORAL | Status: DC | PRN
  Administered 2012-11-23 – 2012-11-28 (×5): 50 mg via ORAL
  Filled 2012-11-23 (×5): qty 1

## 2012-11-23 NOTE — Progress Notes (Signed)
ANTIBIOTIC CONSULT NOTE - INITIAL  Pharmacy Consult for Vancomycin Indication: pneumonia  Allergies  Allergen Reactions  . Codeine   . Penicillins     Patient Measurements: Height: 5\' 3"  (160 cm) Weight: 104 lb (47.174 kg) IBW/kg (Calculated) : 52.4    Vital Signs: Temp: 97.6 F (36.4 C) (01/09 1349) Temp src: Oral (01/09 1349) BP: 105/55 mmHg (01/09 1349) Pulse Rate: 96  (01/09 1349) Intake/Output from previous day:   Intake/Output from this shift:    Labs:  Basename 11/23/12 1312  WBC 31.9*  HGB 13.1  PLT 331  LABCREA --  CREATININE 1.16*   Estimated Creatinine Clearance: 26.9 ml/min (by C-G formula based on Cr of 1.16). No results found for this basename: VANCOTROUGH:2,VANCOPEAK:2,VANCORANDOM:2,GENTTROUGH:2,GENTPEAK:2,GENTRANDOM:2,TOBRATROUGH:2,TOBRAPEAK:2,TOBRARND:2,AMIKACINPEAK:2,AMIKACINTROU:2,AMIKACIN:2, in the last 72 hours   Microbiology: No results found for this or any previous visit (from the past 720 hour(s)).  Medical History: Past Medical History  Diagnosis Date  . Encounter for long-term (current) use of other medications   . Carotid artery stenosis     Without infarction  . Stroke   . Hypertension     Unspecified  . Coronary artery disease   . Hypercholesterolemia     Pure  . Mixed hyperlipidemia   . LBBB (left bundle branch block)     Medications:  Scheduled:    . amLODipine  5 mg Oral Daily   And  . benazepril  20 mg Oral Daily  . aspirin EC  81 mg Oral Daily  . atorvastatin  40 mg Oral q1800  . beta carotene w/minerals  1 tablet Oral Daily  . donepezil  10 mg Oral Daily  . furosemide  20 mg Oral Daily  . gatifloxacin  1 drop Left Eye QID  . levofloxacin (LEVAQUIN) IV  500 mg Intravenous Q24H  . metoprolol  12.5 mg Oral BID  . potassium chloride  10 mEq Oral Daily  . pregabalin  75 mg Oral QODAY  . sodium chloride  3 mL Intravenous Q12H  . vancomycin  500 mg Intravenous Q24H  . Warfarin - Pharmacist Dosing Inpatient    Does not apply q1800  . [DISCONTINUED] amLODipine-benazepril  1 capsule Oral Daily  . [DISCONTINUED] Melatonin  3 mg Oral QHS  . [DISCONTINUED] metoprolol  25 mg Oral BID   Assessment: Patient weight 47.2 kg CrCl 26.9 ml/min  Goal of Therapy:  Vancomycin trough level 15-20 mcg/ml  Plan:  Vancomycin 500 mg IV every 24 hours Monitor renal function Labs per protocol  Josephine Igo 11/23/2012,7:24 PM

## 2012-11-23 NOTE — Progress Notes (Signed)
ANTICOAGULATION CONSULT NOTE - Initial Consult  Pharmacy Consult for Coumadin Indication: atrial fibrillation  Allergies  Allergen Reactions  . Codeine   . Penicillins    Patient Measurements: Height: 5\' 3"  (160 cm) Weight: 104 lb (47.174 kg) IBW/kg (Calculated) : 52.4   Vital Signs: Temp: 97.6 F (36.4 C) (01/09 1349) Temp src: Oral (01/09 1349) BP: 105/55 mmHg (01/09 1349) Pulse Rate: 96  (01/09 1349)  Labs:  Basename 11/23/12 1312  HGB 13.1  HCT 39.2  PLT 331  APTT --  LABPROT 38.3*  INR 4.26*  HEPARINUNFRC --  CREATININE 1.16*  CKTOTAL --  CKMB --  TROPONINI --    Estimated Creatinine Clearance: 26.9 ml/min (by C-G formula based on Cr of 1.16).  Medical History: Past Medical History  Diagnosis Date  . Encounter for long-term (current) use of other medications   . Carotid artery stenosis     Without infarction  . Stroke   . Hypertension     Unspecified  . Coronary artery disease   . Hypercholesterolemia     Pure  . Mixed hyperlipidemia   . LBBB (left bundle branch block)    Medications:  Prescriptions prior to admission  Medication Sig Dispense Refill  . aspirin 81 MG EC tablet Take 81 mg by mouth daily.        Marland Kitchen COUMADIN 5 MG tablet Take as directed by anticoagulation clinic  30 each  3  . donepezil (ARICEPT ODT) 10 MG disintegrating tablet Take 10 mg by mouth daily.        . ergocalciferol (VITAMIN D2) 50000 UNITS capsule Take 50,000 Units by mouth once a week.        . furosemide (LASIX) 20 MG tablet Take 1 tablet (20 mg total) by mouth daily.  30 tablet  3  . LIPITOR 40 MG tablet TAKE ONE TABLET DAILY.  30 each  6  . LOTREL 5-20 MG per capsule TAKE ONE CAPSULE DAILY.  30 each  6  . Melatonin 3 MG TABS Take 3 mg by mouth at bedtime.        . metoprolol (LOPRESSOR) 50 MG tablet Take 0.5 tablets (25 mg total) by mouth 2 (two) times daily.  60 tablet  3  . Multiple Vitamins-Minerals (OCUVITE EXTRA) TABS Take 1 tablet by mouth daily.        .  potassium chloride (K-DUR,KLOR-CON) 10 MEQ tablet Take 1 tablet (10 mEq total) by mouth daily.  30 tablet  6  . pregabalin (LYRICA) 75 MG capsule Take 75 mg by mouth every other day. At bedtime       . ZYMAXID 0.5 % SOLN Place 1 drop into the left eye as needed. Around every 6 weeks for 3 days after Avastin Shot        Assessment: 79yo female on chronic coumadin for Afib.  INR is supra-therapeutic on admission.   Goal of Therapy:  INR 2-3 Monitor platelets by anticoagulation protocol: Yes   Plan: HOLD coumadin today INR daily  [name] A 11/23/2012,3:16 PM

## 2012-11-23 NOTE — H&P (Signed)
[name] MRN: [id number] DOB/AGE: 22-May-1926 6 y.o. Primary Care Physician:Qunicy Higinbotham L, MD Admit date: 11/23/2012 Chief Complaint: Shortness of breath HPI: This is an 79 year old who came to my office with cough congestion and shortness of breath about a week ago. She was treated but did not improve very much. She came back yesterday and I sent her for a chest x-ray which did not show pneumonia and for laboratory work. Her laboratory work showed general white blood count 25,000 and she had not been eating or drinking well and was more short of breath so she was admitted. Her symptoms are related to her chest. She denies any chest pain she's not had any urinary symptoms and she's not had any other symptoms  Past Medical History  Diagnosis Date  . Encounter for long-term (current) use of other medications   . Carotid artery stenosis     Without infarction  . Stroke   . Hypertension     Unspecified  . Coronary artery disease   . Hypercholesterolemia     Pure  . Mixed hyperlipidemia   . LBBB (left bundle branch block)    Past Surgical History  Procedure Date  . Aortic valve replacement   . Coronary artery bypass graft         Family History  Problem Relation Age of Onset  . Heart attack Father   . Stroke Sister     Social History:  reports that she quit smoking about 20 years ago. She has never used smokeless tobacco. She reports that she does not drink alcohol. Her drug history not on file.   Allergies:  Allergies  Allergen Reactions  . Codeine   . Penicillins     Medications Prior to Admission  Medication Sig Dispense Refill  . aspirin 81 MG EC tablet Take 81 mg by mouth daily.        Marland Kitchen donepezil (ARICEPT ODT) 10 MG disintegrating tablet Take 10 mg by mouth daily.        . ergocalciferol (VITAMIN D2) 50000 UNITS capsule Take 50,000 Units by mouth once a week.       . furosemide (LASIX) 20 MG tablet Take 1 tablet (20 mg total) by mouth daily.  30 tablet  3    . levofloxacin (LEVAQUIN) 500 MG tablet Take 500 mg by mouth daily. For 10 days (started on 11/22/12)      . LIPITOR 40 MG tablet TAKE ONE TABLET DAILY.  30 each  6  . LOTREL 5-20 MG per capsule TAKE ONE CAPSULE DAILY.  30 each  6  . Melatonin 3 MG TABS Take 3 mg by mouth at bedtime.        . metoprolol (LOPRESSOR) 50 MG tablet Take 0.5 tablets (25 mg total) by mouth 2 (two) times daily.  60 tablet  3  . Multiple Vitamins-Minerals (OCUVITE EXTRA) TABS Take 1 tablet by mouth daily.        . potassium chloride (K-DUR,KLOR-CON) 10 MEQ tablet Take 1 tablet (10 mEq total) by mouth daily.  30 tablet  6  . pregabalin (LYRICA) 75 MG capsule Take 75 mg by mouth every other day. At bedtime       . warfarin (COUMADIN) 5 MG tablet Take 5 mg by mouth daily. Take 5 mg (1 tablet) on Monday, Wed, and Friday, then take 2.5 mg (1/2 tablet) all other days      . ZYMAXID 0.5 % SOLN Place 1 drop into the left eye as needed. Around  every 6 weeks for 3 days after Avastin Shot           WGN:FAOZH from the symptoms mentioned above,there are no other symptoms referable to all systems reviewed.  Physical Exam: Blood pressure 105/55, pulse 96, temperature 97.6 F (36.4 C), temperature source Oral, resp. rate 28, height 5\' 3"  (1.6 m), weight 47.174 kg (104 lb), SpO2 89.00%. She is awake and alert. She is mildly uncomfortable. She is very thin. Her pupils are reactive. Her nose and throat are clear. Her neck is supple without masses. Her chest shows rhonchi bilaterally but no wheezes. Her heart is regular with a prosthetic aortic valve sound. Her abdomen is soft without masses. Extremities showed no edema. Central nervous system exam is grossly intact. She does have what is probably very mild dementia.    Basename 11/23/12 1312  WBC 31.9*  NEUTROABS 29.0*  HGB 13.1  HCT 39.2  MCV 92.7  PLT 331    Basename 11/23/12 1312  NA 137  K 4.1  CL 99  CO2 31  GLUCOSE 145*  BUN 23  CREATININE 1.16*  CALCIUM 9.6  MG  --  lablast2(ast:2,ALT:2,alkphos:2,bilitot:2,prot:2,albumin:2)@    No results found for this or any previous visit (from the past 240 hour(s)).   Dg Chest 2 View  11/23/2012  *RADIOLOGY REPORT*  Clinical Data: Cough, leukocytosis, shortness of breath.  CHEST - 2 VIEW  Comparison: 11/22/2012.  Findings: Trachea is midline.  Heart size stable.  Thoracic aorta is calcified.  Biapical pleural thickening.  Lungs are hyperinflated with scattered perihilar scarring.  No pleural fluid.  IMPRESSION: COPD without acute finding.   Original Report Authenticated By: Leanna Battles, M.D.    Dg Chest 2 View  11/22/2012  *RADIOLOGY REPORT*  Clinical Data: Cough, congestion, shortness of breath  CHEST - 2 VIEW  Comparison: Chest x-ray of 01/29/2008  Findings: The lungs are clear and slightly hyperaerated. Cardiomegaly is stable.  An aortic valve prosthesis is noted. Considerable calcification of the mitral annulus is present. Median sternotomy sutures are noted from CABG as well.  The bones are osteopenic and there is a slight thoracic kyphosis present with degenerative change.  IMPRESSION: COPD.  No active lung disease.  Stable mild cardiomegaly.   Original Report Authenticated By: Dwyane Dee, M.D.    Impression: She did not show pneumonia on her chest x-ray yesterday but I have repeated that. This still shows COPD. She has fairly marked leukocytosis but is negative for influenza. Active Problems:  * No active hospital problems. *      Plan: She's going to be treated with IV fluids and IV antibiotics. She will have blood cultures et Karie Soda.      Maizie Garno L Pager [phone]  11/23/2012, 6:43 PM

## 2012-11-23 NOTE — Progress Notes (Signed)
PHARMACIST - PHYSICIAN ORDER COMMUNICATION  CONCERNING: P&T Medication Policy on Herbal Medications  DESCRIPTION:  This patient's order for:  Melatonin has been noted.  This product(s) is classified as an "herbal" or natural product. Due to a lack of definitive safety studies or FDA approval, nonstandard manufacturing practices, plus the potential risk of unknown drug-drug interactions while on inpatient medications, the Pharmacy and Therapeutics Committee does not permit the use of "herbal" or natural products of this type within [place].   ACTION TAKEN: The pharmacy department is unable to verify this order at this time. Please reevaluate patient's clinical condition at discharge and address if the herbal or natural product(s) should be resumed at that time.   

## 2012-11-24 DIAGNOSIS — J189 Pneumonia, unspecified organism: Secondary | ICD-10-CM | POA: Diagnosis present

## 2012-11-24 LAB — PROTIME-INR: Prothrombin Time: 36.8 seconds — ABNORMAL HIGH (ref 11.6–15.2)

## 2012-11-24 MED ORDER — LEVOFLOXACIN IN D5W 250 MG/50ML IV SOLN
250.0000 mg | INTRAVENOUS | Status: DC
  Administered 2012-11-24 – 2012-11-28 (×5): 250 mg via INTRAVENOUS
  Filled 2012-11-24 (×6): qty 50

## 2012-11-24 MED ORDER — BOOST HIGH PROTEIN PO LIQD
237.0000 mL | Freq: Two times a day (BID) | ORAL | Status: DC
  Administered 2012-11-26 – 2012-11-28 (×4): 1 via ORAL
  Filled 2012-11-24: qty 237
  Filled 2012-11-24: qty 1
  Filled 2012-11-24: qty 237
  Filled 2012-11-24: qty 1
  Filled 2012-11-24 (×2): qty 237
  Filled 2012-11-24: qty 1
  Filled 2012-11-24: qty 237
  Filled 2012-11-24: qty 1
  Filled 2012-11-24 (×2): qty 237

## 2012-11-24 MED ORDER — PREGABALIN 75 MG PO CAPS
75.0000 mg | ORAL_CAPSULE | ORAL | Status: DC
  Administered 2012-11-24 – 2012-11-29 (×3): 75 mg via ORAL
  Filled 2012-11-24 (×3): qty 1

## 2012-11-24 NOTE — Progress Notes (Signed)
UR chart review completed.  

## 2012-11-24 NOTE — Progress Notes (Signed)
INITIAL NUTRITION ASSESSMENT  DOCUMENTATION CODES Per approved criteria  -Severe malnutrition in the context of acute illness or injury -Underweight   INTERVENTION: Boost Plus BID provides 350 kcal, 14 gr protein each  NUTRITION DIAGNOSIS: Inadequate oral intake related to decrease in appetite as evidenced by pneumonia and pt diet hx review.   Goal: Pt to meet >/= 90% of their estimated nutrition needs; not met  Monitor:  Meals and supplements, labs and wt trends  Reason for Assessment: Malnutrition Score=2  79 y.o. female  Admitting Dx: Pneumonia  ASSESSMENT: Pt admitted with PNA. Poor appetite and po intake past week. Poor appetite and decreased po intake past week. Severe wt loss of  9%,10# (4.5 kg) in 30 days.   Pt meets criteria for severe  MALNUTRITION in the context of acute illness as evidenced by  Wt loss >5% in 30 days and energy intake </= 50% for >/= 5 days.    Height: Ht Readings from Last 1 Encounters:  11/23/12 5\' 3"  (1.6 m)    Weight: Wt Readings from Last 1 Encounters:  11/24/12 104 lb (47.174 kg)    Ideal Body Weight: 115# (52.2 kg)  % Ideal Body Weight: 90%  Wt Readings from Last 10 Encounters:  11/24/12 104 lb (47.174 kg)  05/30/12 115 lb (52.164 kg)  11/30/11 115 lb (52.164 kg)  06/07/11 119 lb (53.978 kg)  11/25/10 119 lb (53.978 kg)  05/27/10 117 lb (53.071 kg)  03/16/10 119 lb (53.978 kg)  10/13/09 123 lb (55.792 kg)  03/17/09 128 lb (58.06 kg)    Usual Body Weight: 114# @ 10/17/12 Dr.visit per daughter  % Usual Body Weight: 91%  BMI:  Body mass index is 18.42 kg/(m^2). Underweight  Estimated Nutritional Needs: Kcal: 1400-1650 Protein: 55-65 gr Fluid: 1 ml/kcal  Skin: no issues noted  Diet Order: Cardiac 50% of dinner last night  EDUCATION NEEDS: Discussed with pt importance of nutrition intake as part of maintaining optimal health.   Intake/Output Summary (Last 24 hours) at 11/24/12 1020 Last data filed at 11/24/12  0914  Gross per 24 hour  Intake    715 ml  Output    700 ml  Net     15 ml    Last BM: 11/23/12   Labs:   Lab 11/23/12 1312  NA 137  K 4.1  CL 99  CO2 31  BUN 23  CREATININE 1.16*  CALCIUM 9.6  MG --  PHOS --  GLUCOSE 145*    CBG (last 3)  No results found for this basename: GLUCAP:3 in the last 72 hours  Scheduled Meds:   . amLODipine  5 mg Oral Daily   And  . benazepril  20 mg Oral Daily  . aspirin EC  81 mg Oral Daily  . atorvastatin  40 mg Oral q1800  . beta carotene w/minerals  1 tablet Oral Daily  . donepezil  10 mg Oral Daily  . furosemide  20 mg Oral Daily  . gatifloxacin  1 drop Left Eye QID  . levofloxacin (LEVAQUIN) IV  250 mg Intravenous Q24H  . metoprolol  12.5 mg Oral BID  . potassium chloride  10 mEq Oral Daily  . pregabalin  75 mg Oral QODAY  . sodium chloride  3 mL Intravenous Q12H  . vancomycin  500 mg Intravenous Q24H  . Warfarin - Pharmacist Dosing Inpatient   Does not apply q1800    Continuous Infusions:   . sodium chloride 125 mL/hr at 11/24/12 0741  Past Medical History  Diagnosis Date  . Encounter for long-term (current) use of other medications   . Carotid artery stenosis     Without infarction  . Stroke   . Hypertension     Unspecified  . Coronary artery disease   . Hypercholesterolemia     Pure  . Mixed hyperlipidemia   . LBBB (left bundle branch block)     Past Surgical History  Procedure Date  . Aortic valve replacement   . Coronary artery bypass graft     #[phone]

## 2012-11-24 NOTE — Care Management Note (Signed)
    Page 1 of 2   11/29/2012     9:13:00 AM   CARE MANAGEMENT NOTE 11/29/2012  Patient:  [name], [name]   Account Number:  [id number]  Date Initiated:  11/24/2012  Documentation initiated by:  Sharrie Rothman  Subjective/Objective Assessment:   Pt admitted from home with bronchitis and pneumonia. Pt lives alone and will return home at discharge. Pt is independent with ADL's.     Action/Plan:   Will monitor for O2 needs at discharge.   Anticipated DC Date:  11/27/2012   Anticipated DC Plan:  HOME/SELF CARE      DC Planning Services  CM consult      [facility] Choice  HOME HEALTH   Choice offered to / List presented to:  C-1 Patient        HH arranged  HH-2 PT  HH-1 RN      [facility] agency  [facility]   Status of service:  Completed, signed off Medicare Important Message given?  YES (If response is "NO", the following Medicare IM given date fields will be blank) Date Medicare IM given:  11/29/2012 Date Additional Medicare IM given:    Discharge Disposition:  HOME W HOME HEALTH SERVICES  Per UR Regulation:    If discussed at Long Length of Stay Meetings, dates discussed:   11/28/2012    Comments:  11/29/12 0910 Arlyss Queen, RN BSN CM Pt discharged home with [facility] RN and PT. Alroy Bailiff of [facility] is aware and will collect the pts information from the chart. No DME needs noted. Pt and pts nurse aware of discharge arrangements.  11/24/12 1405 Arlyss Queen, RN BSN CM

## 2012-11-24 NOTE — Progress Notes (Signed)
Subjective: She said she had severe cough through the night. She has no other new complaints. She is breathing a little bit better.  Objective: Vital signs in last 24 hours: Temp:  [97.3 F (36.3 C)-98.2 F (36.8 C)] 98.2 F (36.8 C) (01/10 0500) Pulse Rate:  [92-102] 102  (01/10 0500) Resp:  [20-28] 20  (01/09 2129) BP: (101-106)/(40-85) 106/85 mmHg (01/10 0500) SpO2:  [85 %-98 %] 93 % (01/10 0541) Weight:  [47.174 kg (104 lb)] 47.174 kg (104 lb) (01/10 0500) Weight change:  Last BM Date: 11/23/12  Intake/Output from previous day: 01/09 0701 - 01/10 0700 In: 715 [P.O.:240; I.V.:375; IV Piggyback:100] Out: 550 [Urine:550]  PHYSICAL EXAM General appearance: alert, cooperative and mild distress Resp: clear to auscultation bilaterally Cardio: regular rate and rhythm, S1, S2 normal, no murmur, click, rub or gallop GI: soft, non-tender; bowel sounds normal; no masses,  no organomegaly Extremities: extremities normal, atraumatic, no cyanosis or edema  Lab Results:    Basic Metabolic Panel:  Basename 11/23/12 1312  NA 137  K 4.1  CL 99  CO2 31  GLUCOSE 145*  BUN 23  CREATININE 1.16*  CALCIUM 9.6  MG --  PHOS --   Liver Function Tests:  Basename 11/23/12 1312  AST 23  ALT 23  ALKPHOS 99  BILITOT 1.0  PROT 7.3  ALBUMIN 3.5   No results found for this basename: LIPASE:2,AMYLASE:2 in the last 72 hours No results found for this basename: AMMONIA:2 in the last 72 hours CBC:  Basename 11/23/12 1312  WBC 31.9*  NEUTROABS 29.0*  HGB 13.1  HCT 39.2  MCV 92.7  PLT 331   Cardiac Enzymes: No results found for this basename: CKTOTAL:3,CKMB:3,CKMBINDEX:3,TROPONINI:3 in the last 72 hours BNP: No results found for this basename: PROBNP:3 in the last 72 hours D-Dimer: No results found for this basename: DDIMER:2 in the last 72 hours CBG: No results found for this basename: GLUCAP:6 in the last 72 hours Hemoglobin A1C: No results found for this basename: HGBA1C in  the last 72 hours Fasting Lipid Panel: No results found for this basename: CHOL,HDL,LDLCALC,TRIG,CHOLHDL,LDLDIRECT in the last 72 hours Thyroid Function Tests: No results found for this basename: TSH,T4TOTAL,FREET4,T3FREE,THYROIDAB in the last 72 hours Anemia Panel: No results found for this basename: VITAMINB12,FOLATE,FERRITIN,TIBC,IRON,RETICCTPCT in the last 72 hours Coagulation:  Basename 11/24/12 0540 11/23/12 1312  LABPROT 36.8* 38.3*  INR 4.03* 4.26*   Urine Drug Screen: Drugs of Abuse  No results found for this basename: labopia, cocainscrnur, labbenz, amphetmu, thcu, labbarb    Alcohol Level: No results found for this basename: ETH:2 in the last 72 hours Urinalysis: No results found for this basename: COLORURINE:2,APPERANCEUR:2,LABSPEC:2,PHURINE:2,GLUCOSEU:2,HGBUR:2,BILIRUBINUR:2,KETONESUR:2,PROTEINUR:2,UROBILINOGEN:2,NITRITE:2,LEUKOCYTESUR:2 in the last 72 hours Misc. Labs:  ABGS No results found for this basename: PHART,PCO2,PO2ART,TCO2,HCO3 in the last 72 hours CULTURES Recent Results (from the past 240 hour(s))  CULTURE, BLOOD (ROUTINE X 2)     Status: Normal (Preliminary result)   Collection Time   11/23/12  1:03 PM      Component Value Range Status Comment   Specimen Description BLOOD RIGHT ANTECUBITAL   Final    Special Requests     Final    Value: BOTTLES DRAWN AEROBIC AND ANAEROBIC AEB=10CC ANA=6CC   Culture NO GROWTH 1 DAY   Final    Report Status PENDING   Incomplete   CULTURE, BLOOD (ROUTINE X 2)     Status: Normal (Preliminary result)   Collection Time   11/23/12  1:08 PM      Component  Value Range Status Comment   Specimen Description BLOOD LEFT ANTECUBITAL   Final    Special Requests     Final    Value: BOTTLES DRAWN AEROBIC AND ANAEROBIC AEB=12CC ANA=7CC   Culture NO GROWTH 1 DAY   Final    Report Status PENDING   Incomplete    Studies/Results: Dg Chest 2 View  11/23/2012  *RADIOLOGY REPORT*  Clinical Data: Cough, leukocytosis, shortness of breath.   CHEST - 2 VIEW  Comparison: 11/22/2012.  Findings: Trachea is midline.  Heart size stable.  Thoracic aorta is calcified.  Biapical pleural thickening.  Lungs are hyperinflated with scattered perihilar scarring.  No pleural fluid.  IMPRESSION: COPD without acute finding.   Original Report Authenticated By: Leanna Battles, M.D.    Dg Chest 2 View  11/22/2012  *RADIOLOGY REPORT*  Clinical Data: Cough, congestion, shortness of breath  CHEST - 2 VIEW  Comparison: Chest x-ray of 01/29/2008  Findings: The lungs are clear and slightly hyperaerated. Cardiomegaly is stable.  An aortic valve prosthesis is noted. Considerable calcification of the mitral annulus is present. Median sternotomy sutures are noted from CABG as well.  The bones are osteopenic and there is a slight thoracic kyphosis present with degenerative change.  IMPRESSION: COPD.  No active lung disease.  Stable mild cardiomegaly.   Original Report Authenticated By: Dwyane Dee, M.D.     Medications:  Prior to Admission:  Prescriptions prior to admission  Medication Sig Dispense Refill  . aspirin 81 MG EC tablet Take 81 mg by mouth daily.        Marland Kitchen donepezil (ARICEPT ODT) 10 MG disintegrating tablet Take 10 mg by mouth daily.        . ergocalciferol (VITAMIN D2) 50000 UNITS capsule Take 50,000 Units by mouth once a week.       . furosemide (LASIX) 20 MG tablet Take 1 tablet (20 mg total) by mouth daily.  30 tablet  3  . levofloxacin (LEVAQUIN) 500 MG tablet Take 500 mg by mouth daily. For 10 days (started on 11/22/12)      . LIPITOR 40 MG tablet TAKE ONE TABLET DAILY.  30 each  6  . LOTREL 5-20 MG per capsule TAKE ONE CAPSULE DAILY.  30 each  6  . Melatonin 3 MG TABS Take 3 mg by mouth at bedtime.        . metoprolol (LOPRESSOR) 50 MG tablet Take 0.5 tablets (25 mg total) by mouth 2 (two) times daily.  60 tablet  3  . Multiple Vitamins-Minerals (OCUVITE EXTRA) TABS Take 1 tablet by mouth daily.        . potassium chloride (K-DUR,KLOR-CON) 10 MEQ  tablet Take 1 tablet (10 mEq total) by mouth daily.  30 tablet  6  . pregabalin (LYRICA) 75 MG capsule Take 75 mg by mouth every other day. At bedtime       . warfarin (COUMADIN) 5 MG tablet Take 5 mg by mouth daily. Take 5 mg (1 tablet) on Monday, Wed, and Friday, then take 2.5 mg (1/2 tablet) all other days      . ZYMAXID 0.5 % SOLN Place 1 drop into the left eye as needed. Around every 6 weeks for 3 days after Avastin Shot       Scheduled:   . amLODipine  5 mg Oral Daily   And  . benazepril  20 mg Oral Daily  . aspirin EC  81 mg Oral Daily  . atorvastatin  40 mg Oral q1800  .  beta carotene w/minerals  1 tablet Oral Daily  . donepezil  10 mg Oral Daily  . furosemide  20 mg Oral Daily  . gatifloxacin  1 drop Left Eye QID  . levofloxacin (LEVAQUIN) IV  250 mg Intravenous Q24H  . metoprolol  12.5 mg Oral BID  . potassium chloride  10 mEq Oral Daily  . pregabalin  75 mg Oral QODAY  . sodium chloride  3 mL Intravenous Q12H  . vancomycin  500 mg Intravenous Q24H  . Warfarin - Pharmacist Dosing Inpatient   Does not apply q1800   Continuous:   . sodium chloride 125 mL/hr at 11/24/12 0741   BJY:[id number], acetaminophen, alum & mag hydroxide-simeth, benzonatate, guaiFENesin-dextromethorphan, ondansetron (ZOFRAN) IV, ondansetron, traZODone  Assesment: She was admitted with febrile illness cough and elevated white blood cell count. She does not show pneumonia on chest x-ray but clinically she is being treated for that. She did not have influenza. She seemed to be somewhat dehydrated. She is better today and generally looks better. Active Problems:  * No active hospital problems. *     Plan: Continue current treatments including IV fluids antibiotics et Karie Soda    LOS: 1 day   Adama Ivins L 11/24/2012, 8:54 AM

## 2012-11-24 NOTE — Progress Notes (Signed)
ANTICOAGULATION CONSULT NOTE  Pharmacy Consult for Coumadin Indication: AVR  Allergies  Allergen Reactions  . Codeine   . Penicillins    Patient Measurements: Height: 5\' 3"  (160 cm) Weight: 104 lb (47.174 kg) IBW/kg (Calculated) : 52.4   Vital Signs: Temp: 98.2 F (36.8 C) (01/10 0500) Temp src: Oral (01/10 0500) BP: 106/85 mmHg (01/10 0500) Pulse Rate: 102  (01/10 0500)  Labs:  Basename 11/24/12 0540 11/23/12 1312  HGB -- 13.1  HCT -- 39.2  PLT -- 331  APTT -- --  LABPROT 36.8* 38.3*  INR 4.03* 4.26*  HEPARINUNFRC -- --  CREATININE -- 1.16*  CKTOTAL -- --  CKMB -- --  TROPONINI -- --    Estimated Creatinine Clearance: 26.9 ml/min (by C-G formula based on Cr of 1.16).  Medical History: Past Medical History  Diagnosis Date  . Encounter for long-term (current) use of other medications   . Carotid artery stenosis     Without infarction  . Stroke   . Hypertension     Unspecified  . Coronary artery disease   . Hypercholesterolemia     Pure  . Mixed hyperlipidemia   . LBBB (left bundle branch block)    Medications:  Prescriptions prior to admission  Medication Sig Dispense Refill  . aspirin 81 MG EC tablet Take 81 mg by mouth daily.        Marland Kitchen donepezil (ARICEPT ODT) 10 MG disintegrating tablet Take 10 mg by mouth daily.        . ergocalciferol (VITAMIN D2) 50000 UNITS capsule Take 50,000 Units by mouth once a week.       . furosemide (LASIX) 20 MG tablet Take 1 tablet (20 mg total) by mouth daily.  30 tablet  3  . levofloxacin (LEVAQUIN) 500 MG tablet Take 500 mg by mouth daily. For 10 days (started on 11/22/12)      . LIPITOR 40 MG tablet TAKE ONE TABLET DAILY.  30 each  6  . LOTREL 5-20 MG per capsule TAKE ONE CAPSULE DAILY.  30 each  6  . Melatonin 3 MG TABS Take 3 mg by mouth at bedtime.        . metoprolol (LOPRESSOR) 50 MG tablet Take 0.5 tablets (25 mg total) by mouth 2 (two) times daily.  60 tablet  3  . Multiple Vitamins-Minerals (OCUVITE EXTRA) TABS  Take 1 tablet by mouth daily.        . potassium chloride (K-DUR,KLOR-CON) 10 MEQ tablet Take 1 tablet (10 mEq total) by mouth daily.  30 tablet  6  . pregabalin (LYRICA) 75 MG capsule Take 75 mg by mouth every other day. At bedtime       . warfarin (COUMADIN) 5 MG tablet Take 5 mg by mouth daily. Take 5 mg (1 tablet) on Monday, Wed, and Friday, then take 2.5 mg (1/2 tablet) all other days      . ZYMAXID 0.5 % SOLN Place 1 drop into the left eye as needed. Around every 6 weeks for 3 days after Avastin Shot        Assessment: 79yo female on chronic coumadin for hx AVR.  INR was supra-therapeutic on admission & remains unchanged today.   No bleeding noted.     Goal of Therapy:  INR 2.5-3.5    Plan: HOLD coumadin today INR daily Change Levaquin dose to 250mg  IV daily for CrCl 20-50 ml/min  Elson Clan 11/24/2012,7:58 AM

## 2012-11-25 LAB — BASIC METABOLIC PANEL
CO2: 28 mEq/L (ref 19–32)
Chloride: 108 mEq/L (ref 96–112)
GFR calc Af Amer: 69 mL/min — ABNORMAL LOW (ref >90)
Potassium: 3.3 mEq/L — ABNORMAL LOW (ref 3.5–5.1)
Sodium: 142 mEq/L (ref 135–145)

## 2012-11-25 LAB — PROTIME-INR
INR: 3.44 — ABNORMAL HIGH (ref 0.00–1.49)
Prothrombin Time: 32.7 seconds — ABNORMAL HIGH (ref 11.6–15.2)

## 2012-11-25 LAB — URINE MICROSCOPIC-ADD ON

## 2012-11-25 LAB — URINALYSIS, ROUTINE W REFLEX MICROSCOPIC
Ketones, ur: NEGATIVE mg/dL
Leukocytes, UA: NEGATIVE
Nitrite: NEGATIVE
Specific Gravity, Urine: 1.025 (ref 1.005–1.030)
pH: 5.5 (ref 5.0–8.0)

## 2012-11-25 LAB — CBC WITH DIFFERENTIAL/PLATELET
Basophils Absolute: 0 10*3/uL (ref 0.0–0.1)
Basophils Relative: 0 % (ref 0–1)
Lymphocytes Relative: 5 % — ABNORMAL LOW (ref 12–46)
MCHC: 32.7 g/dL (ref 30.0–36.0)
Neutro Abs: 20.7 10*3/uL — ABNORMAL HIGH (ref 1.7–7.7)
Neutrophils Relative %: 86 % — ABNORMAL HIGH (ref 43–77)
RDW: 14.1 % (ref 11.5–15.5)
WBC: 24.1 10*3/uL — ABNORMAL HIGH (ref 4.0–10.5)

## 2012-11-25 LAB — EXPECTORATED SPUTUM ASSESSMENT W GRAM STAIN, RFLX TO RESP C

## 2012-11-25 MED ORDER — POTASSIUM CHLORIDE CRYS ER 20 MEQ PO TBCR
20.0000 meq | EXTENDED_RELEASE_TABLET | Freq: Two times a day (BID) | ORAL | Status: DC
  Administered 2012-11-25 – 2012-11-28 (×7): 20 meq via ORAL
  Filled 2012-11-25 (×7): qty 1

## 2012-11-25 MED ORDER — WARFARIN SODIUM 2.5 MG PO TABS
2.5000 mg | ORAL_TABLET | Freq: Once | ORAL | Status: AC
  Administered 2012-11-25: 2.5 mg via ORAL
  Filled 2012-11-25: qty 1

## 2012-11-25 NOTE — Progress Notes (Signed)
ANTICOAGULATION CONSULT NOTE  Pharmacy Consult for Coumadin Indication: AVR  Allergies  Allergen Reactions  . Codeine   . Penicillins    Patient Measurements: Height: 5\' 3"  (160 cm) Weight: 108 lb 1.6 oz (49.034 kg) IBW/kg (Calculated) : 52.4   Vital Signs: Temp: 99.2 F (37.3 C) (01/11 0417) Temp src: Oral (01/11 0417) BP: 118/46 mmHg (01/11 0417) Pulse Rate: 75  (01/11 0417)  Labs:  Basename 11/25/12 0601 11/24/12 0540 11/23/12 1312  HGB 10.4* -- 13.1  HCT 31.8* -- 39.2  PLT 277 -- 331  APTT -- -- --  LABPROT 32.7* 36.8* 38.3*  INR 3.44* 4.03* 4.26*  HEPARINUNFRC -- -- --  CREATININE 0.87 -- 1.16*  CKTOTAL -- -- --  CKMB -- -- --  TROPONINI -- -- --    Estimated Creatinine Clearance: 37.2 ml/min (by C-G formula based on Cr of 0.87).  Medical History: Past Medical History  Diagnosis Date  . Encounter for long-term (current) use of other medications   . Carotid artery stenosis     Without infarction  . Stroke   . Hypertension     Unspecified  . Coronary artery disease   . Hypercholesterolemia     Pure  . Mixed hyperlipidemia   . LBBB (left bundle branch block)    Medications:  Prescriptions prior to admission  Medication Sig Dispense Refill  . aspirin 81 MG EC tablet Take 81 mg by mouth daily.        Marland Kitchen donepezil (ARICEPT ODT) 10 MG disintegrating tablet Take 10 mg by mouth daily.        . ergocalciferol (VITAMIN D2) 50000 UNITS capsule Take 50,000 Units by mouth once a week.       . furosemide (LASIX) 20 MG tablet Take 1 tablet (20 mg total) by mouth daily.  30 tablet  3  . levofloxacin (LEVAQUIN) 500 MG tablet Take 500 mg by mouth daily. For 10 days (started on 11/22/12)      . LIPITOR 40 MG tablet TAKE ONE TABLET DAILY.  30 each  6  . LOTREL 5-20 MG per capsule TAKE ONE CAPSULE DAILY.  30 each  6  . Melatonin 3 MG TABS Take 3 mg by mouth at bedtime.        . metoprolol (LOPRESSOR) 50 MG tablet Take 0.5 tablets (25 mg total) by mouth 2 (two) times  daily.  60 tablet  3  . Multiple Vitamins-Minerals (OCUVITE EXTRA) TABS Take 1 tablet by mouth daily.        . potassium chloride (K-DUR,KLOR-CON) 10 MEQ tablet Take 1 tablet (10 mEq total) by mouth daily.  30 tablet  6  . pregabalin (LYRICA) 75 MG capsule Take 75 mg by mouth every other day. At bedtime       . warfarin (COUMADIN) 5 MG tablet Take 5 mg by mouth daily. Take 5 mg (1 tablet) on Monday, Wed, and Friday, then take 2.5 mg (1/2 tablet) all other days      . ZYMAXID 0.5 % SOLN Place 1 drop into the left eye as needed. Around every 6 weeks for 3 days after Avastin Shot        Assessment: 79yo female on chronic coumadin for hx AVR.  INR was supra-therapeutic on admission.  It is now in goal range after being held x 2 days. No bleeding noted.     Goal of Therapy:  INR 2.5-3.5   Plan: Coumadin 2.5mg  po today x1  INR daily  Emily Filbert  Michelle 11/25/2012,9:52 AM

## 2012-11-25 NOTE — Progress Notes (Signed)
Subjective: She says she feels better. She is less short of breath she has no other new complaints  Objective: Vital signs in last 24 hours: Temp:  [97.4 F (36.3 C)-99.2 F (37.3 C)] 97.9 F (36.6 C) (01/11 1431) Pulse Rate:  [75-96] 78  (01/11 1431) Resp:  [20] 20  (01/11 1431) BP: (116-146)/(46-60) 116/48 mmHg (01/11 1431) SpO2:  [97 %-98 %] 98 % (01/11 1431) Weight:  [49.034 kg (108 lb 1.6 oz)] 49.034 kg (108 lb 1.6 oz) (01/11 0417) Weight change: 1.86 kg (4 lb 1.6 oz) Last BM Date: 11/24/12  Intake/Output from previous day: 01/10 0701 - 01/11 0700 In: 920 [P.O.:920] Out: 1300 [Urine:1300]  PHYSICAL EXAM General appearance: alert, cooperative and mild distress Resp: rhonchi bilaterally Cardio: prosthetic heart valve sounds GI: soft, non-tender; bowel sounds normal; no masses,  no organomegaly Extremities: extremities normal, atraumatic, no cyanosis or edema  Lab Results:    Basic Metabolic Panel:  Basename 11/25/12 0601 11/23/12 1312  NA 142 137  K 3.3* 4.1  CL 108 99  CO2 28 31  GLUCOSE 102* 145*  BUN 10 23  CREATININE 0.87 1.16*  CALCIUM 8.4 9.6  MG -- --  PHOS -- --   Liver Function Tests:  Basename 11/23/12 1312  AST 23  ALT 23  ALKPHOS 99  BILITOT 1.0  PROT 7.3  ALBUMIN 3.5   No results found for this basename: LIPASE:2,AMYLASE:2 in the last 72 hours No results found for this basename: AMMONIA:2 in the last 72 hours CBC:  Basename 11/25/12 0601 11/23/12 1312  WBC 24.1* 31.9*  NEUTROABS 20.7* 29.0*  HGB 10.4* 13.1  HCT 31.8* 39.2  MCV 94.4 92.7  PLT 277 331   Cardiac Enzymes: No results found for this basename: CKTOTAL:3,CKMB:3,CKMBINDEX:3,TROPONINI:3 in the last 72 hours BNP: No results found for this basename: PROBNP:3 in the last 72 hours D-Dimer: No results found for this basename: DDIMER:2 in the last 72 hours CBG: No results found for this basename: GLUCAP:6 in the last 72 hours Hemoglobin A1C: No results found for this  basename: HGBA1C in the last 72 hours Fasting Lipid Panel: No results found for this basename: CHOL,HDL,LDLCALC,TRIG,CHOLHDL,LDLDIRECT in the last 72 hours Thyroid Function Tests: No results found for this basename: TSH,T4TOTAL,FREET4,T3FREE,THYROIDAB in the last 72 hours Anemia Panel: No results found for this basename: VITAMINB12,FOLATE,FERRITIN,TIBC,IRON,RETICCTPCT in the last 72 hours Coagulation:  Basename 11/25/12 0601 11/24/12 0540  LABPROT 32.7* 36.8*  INR 3.44* 4.03*   Urine Drug Screen: Drugs of Abuse  No results found for this basename: labopia, cocainscrnur, labbenz, amphetmu, thcu, labbarb    Alcohol Level: No results found for this basename: ETH:2 in the last 72 hours Urinalysis:  Basename 11/25/12 0010  COLORURINE YELLOW  LABSPEC 1.025  PHURINE 5.5  GLUCOSEU NEGATIVE  HGBUR MODERATE*  BILIRUBINUR NEGATIVE  KETONESUR NEGATIVE  PROTEINUR NEGATIVE  UROBILINOGEN 0.2  NITRITE NEGATIVE  LEUKOCYTESUR NEGATIVE   Misc. Labs:  ABGS No results found for this basename: PHART,PCO2,PO2ART,TCO2,HCO3 in the last 72 hours CULTURES Recent Results (from the past 240 hour(s))  CULTURE, BLOOD (ROUTINE X 2)     Status: Normal (Preliminary result)   Collection Time   11/23/12  1:03 PM      Component Value Range Status Comment   Specimen Description BLOOD RIGHT ANTECUBITAL   Final    Special Requests     Final    Value: BOTTLES DRAWN AEROBIC AND ANAEROBIC AEB=10CC ANA=6CC   Culture NO GROWTH 2 DAYS   Final  Report Status PENDING   Incomplete   CULTURE, BLOOD (ROUTINE X 2)     Status: Normal (Preliminary result)   Collection Time   11/23/12  1:08 PM      Component Value Range Status Comment   Specimen Description BLOOD LEFT ANTECUBITAL   Final    Special Requests     Final    Value: BOTTLES DRAWN AEROBIC AND ANAEROBIC AEB=12CC ANA=7CC   Culture NO GROWTH 2 DAYS   Final    Report Status PENDING   Incomplete   CULTURE, EXPECTORATED SPUTUM-ASSESSMENT     Status: Normal    Collection Time   11/25/12 10:48 AM      Component Value Range Status Comment   Specimen Description SPUTUM   Final    Special Requests Normal   Final    Sputum evaluation     Final    Value: THIS SPECIMEN IS ACCEPTABLE. RESPIRATORY CULTURE REPORT TO FOLLOW.   Report Status 11/25/2012 FINAL   Final    Studies/Results: Dg Chest 2 View  11/23/2012  *RADIOLOGY REPORT*  Clinical Data: Cough, leukocytosis, shortness of breath.  CHEST - 2 VIEW  Comparison: 11/22/2012.  Findings: Trachea is midline.  Heart size stable.  Thoracic aorta is calcified.  Biapical pleural thickening.  Lungs are hyperinflated with scattered perihilar scarring.  No pleural fluid.  IMPRESSION: COPD without acute finding.   Original Report Authenticated By: Leanna Battles, M.D.     Medications:  Scheduled:   . amLODipine  5 mg Oral Daily   And  . benazepril  20 mg Oral Daily  . aspirin EC  81 mg Oral Daily  . atorvastatin  40 mg Oral q1800  . beta carotene w/minerals  1 tablet Oral Daily  . donepezil  10 mg Oral Daily  . feeding supplement  237 mL Oral BID BM  . furosemide  20 mg Oral Daily  . gatifloxacin  1 drop Left Eye QID  . levofloxacin (LEVAQUIN) IV  250 mg Intravenous Q24H  . metoprolol  12.5 mg Oral BID  . potassium chloride  20 mEq Oral BID  . pregabalin  75 mg Oral QODAY  . sodium chloride  3 mL Intravenous Q12H  . vancomycin  500 mg Intravenous Q24H  . warfarin  2.5 mg Oral ONCE-1800  . Warfarin - Pharmacist Dosing Inpatient   Does not apply q1800   Continuous:   . sodium chloride 125 mL/hr at 11/25/12 1030   ZOX:[id number], acetaminophen, alum & mag hydroxide-simeth, benzonatate, guaiFENesin-dextromethorphan, ondansetron (ZOFRAN) IV, ondansetron, traZODone  Assesment:she has pneumonia. She was dehydrated which is better. Her white blood count is better. She is mildly hypokalemic Principal Problem:  *Pneumonia Active Problems:  HYPERTENSION, UNSPECIFIED  CAD, ARTERY BYPASS GRAFT  CAROTID  ARTERY STENOSIS, WITHOUT INFARCTION  CVA  AORTIC VALVE REPLACEMENT, HX OF    Plan:continue IV antibiotics and fluids add potassium    LOS: 2 days   Tremeka Helbling L 11/25/2012, 3:16 PM

## 2012-11-26 LAB — PROTIME-INR
INR: 2.43 — ABNORMAL HIGH (ref 0.00–1.49)
Prothrombin Time: 25.3 seconds — ABNORMAL HIGH (ref 11.6–15.2)

## 2012-11-26 LAB — VANCOMYCIN, TROUGH: Vancomycin Tr: <5 ug/mL — ABNORMAL LOW (ref 10.0–20.0)

## 2012-11-26 MED ORDER — WARFARIN SODIUM 5 MG PO TABS
5.0000 mg | ORAL_TABLET | Freq: Once | ORAL | Status: AC
  Administered 2012-11-26: 5 mg via ORAL
  Filled 2012-11-26: qty 1

## 2012-11-26 MED ORDER — VANCOMYCIN HCL 500 MG IV SOLR
INTRAVENOUS | Status: AC
  Filled 2012-11-26: qty 500

## 2012-11-26 NOTE — Progress Notes (Signed)
ANTICOAGULATION CONSULT NOTE  Pharmacy Consult for Coumadin Indication: AVR  Allergies  Allergen Reactions  . Codeine   . Penicillins    Patient Measurements: Height: 5\' 3"  (160 cm) Weight: 115 lb 1.3 oz (52.2 kg) IBW/kg (Calculated) : 52.4   Vital Signs: Temp: 98.5 F (36.9 C) (01/12 0408) Temp src: Oral (01/12 0408) BP: 122/49 mmHg (01/12 0408) Pulse Rate: 105  (01/12 0408)  Labs:  Basename 11/26/12 0740 11/25/12 0601 11/24/12 0540 11/23/12 1312  HGB -- 10.4* -- 13.1  HCT -- 31.8* -- 39.2  PLT -- 277 -- 331  APTT -- -- -- --  LABPROT 25.3* 32.7* 36.8* --  INR 2.43* 3.44* 4.03* --  HEPARINUNFRC -- -- -- --  CREATININE -- 0.87 -- 1.16*  CKTOTAL -- -- -- --  CKMB -- -- -- --  TROPONINI -- -- -- --    Estimated Creatinine Clearance: 39.7 ml/min (by C-G formula based on Cr of 0.87).  Medical History: Past Medical History  Diagnosis Date  . Encounter for long-term (current) use of other medications   . Carotid artery stenosis     Without infarction  . Stroke   . Hypertension     Unspecified  . Coronary artery disease   . Hypercholesterolemia     Pure  . Mixed hyperlipidemia   . LBBB (left bundle branch block)    Medications:  Prescriptions prior to admission  Medication Sig Dispense Refill  . aspirin 81 MG EC tablet Take 81 mg by mouth daily.        Marland Kitchen donepezil (ARICEPT ODT) 10 MG disintegrating tablet Take 10 mg by mouth daily.        . ergocalciferol (VITAMIN D2) 50000 UNITS capsule Take 50,000 Units by mouth once a week.       . furosemide (LASIX) 20 MG tablet Take 1 tablet (20 mg total) by mouth daily.  30 tablet  3  . levofloxacin (LEVAQUIN) 500 MG tablet Take 500 mg by mouth daily. For 10 days (started on 11/22/12)      . LIPITOR 40 MG tablet TAKE ONE TABLET DAILY.  30 each  6  . LOTREL 5-20 MG per capsule TAKE ONE CAPSULE DAILY.  30 each  6  . Melatonin 3 MG TABS Take 3 mg by mouth at bedtime.        . metoprolol (LOPRESSOR) 50 MG tablet Take 0.5  tablets (25 mg total) by mouth 2 (two) times daily.  60 tablet  3  . Multiple Vitamins-Minerals (OCUVITE EXTRA) TABS Take 1 tablet by mouth daily.        . potassium chloride (K-DUR,KLOR-CON) 10 MEQ tablet Take 1 tablet (10 mEq total) by mouth daily.  30 tablet  6  . pregabalin (LYRICA) 75 MG capsule Take 75 mg by mouth every other day. At bedtime       . warfarin (COUMADIN) 5 MG tablet Take 5 mg by mouth daily. Take 5 mg (1 tablet) on Monday, Wed, and Friday, then take 2.5 mg (1/2 tablet) all other days      . ZYMAXID 0.5 % SOLN Place 1 drop into the left eye as needed. Around every 6 weeks for 3 days after Avastin Shot        Assessment: 79yo female on chronic coumadin for hx AVR.  INR was supra-therapeutic on admission.  It is now just below goal range after being held x 2 days. No bleeding noted.     Goal of Therapy:  INR 2.5-3.5  Plan: Coumadin 5mg  po today x1  INR daily  Elson Clan 11/26/2012,8:43 AM

## 2012-11-26 NOTE — Progress Notes (Signed)
Subjective: She says she's feeling a little better but still coughing. She has no other new complaints. She's not able to cough anything up. She is still short of breath.  Objective: Vital signs in last 24 hours: Temp:  [97.9 F (36.6 C)-98.9 F (37.2 C)] 98.5 F (36.9 C) (01/12 0408) Pulse Rate:  [78-105] 105  (01/12 0408) Resp:  [20-24] 24  (01/12 0408) BP: (116-129)/(48-55) 122/49 mmHg (01/12 0408) SpO2:  [93 %-98 %] 93 % (01/12 0408) Weight:  [52.2 kg (115 lb 1.3 oz)] 52.2 kg (115 lb 1.3 oz) (01/12 0408) Weight change: 3.166 kg (6 lb 15.7 oz) Last BM Date: 11/24/12  Intake/Output from previous day: 01/11 0701 - 01/12 0700 In: 880 [P.O.:880] Out: 1450 [Urine:1450]  PHYSICAL EXAM General appearance: alert, cooperative and mild distress Resp: rhonchi bilaterally Cardio: She has a regular rhythm and has prosthetic heart valve sounds GI: soft, non-tender; bowel sounds normal; no masses,  no organomegaly Extremities: extremities normal, atraumatic, no cyanosis or edema  Lab Results:    Basic Metabolic Panel:  Basename 11/25/12 0601 11/23/12 1312  NA 142 137  K 3.3* 4.1  CL 108 99  CO2 28 31  GLUCOSE 102* 145*  BUN 10 23  CREATININE 0.87 1.16*  CALCIUM 8.4 9.6  MG -- --  PHOS -- --   Liver Function Tests:  Basename 11/23/12 1312  AST 23  ALT 23  ALKPHOS 99  BILITOT 1.0  PROT 7.3  ALBUMIN 3.5   No results found for this basename: LIPASE:2,AMYLASE:2 in the last 72 hours No results found for this basename: AMMONIA:2 in the last 72 hours CBC:  Basename 11/25/12 0601 11/23/12 1312  WBC 24.1* 31.9*  NEUTROABS 20.7* 29.0*  HGB 10.4* 13.1  HCT 31.8* 39.2  MCV 94.4 92.7  PLT 277 331   Cardiac Enzymes: No results found for this basename: CKTOTAL:3,CKMB:3,CKMBINDEX:3,TROPONINI:3 in the last 72 hours BNP: No results found for this basename: PROBNP:3 in the last 72 hours D-Dimer: No results found for this basename: DDIMER:2 in the last 72 hours CBG: No  results found for this basename: GLUCAP:6 in the last 72 hours Hemoglobin A1C: No results found for this basename: HGBA1C in the last 72 hours Fasting Lipid Panel: No results found for this basename: CHOL,HDL,LDLCALC,TRIG,CHOLHDL,LDLDIRECT in the last 72 hours Thyroid Function Tests: No results found for this basename: TSH,T4TOTAL,FREET4,T3FREE,THYROIDAB in the last 72 hours Anemia Panel: No results found for this basename: VITAMINB12,FOLATE,FERRITIN,TIBC,IRON,RETICCTPCT in the last 72 hours Coagulation:  Basename 11/25/12 0601 11/24/12 0540  LABPROT 32.7* 36.8*  INR 3.44* 4.03*   Urine Drug Screen: Drugs of Abuse  No results found for this basename: labopia, cocainscrnur, labbenz, amphetmu, thcu, labbarb    Alcohol Level: No results found for this basename: ETH:2 in the last 72 hours Urinalysis:  Basename 11/25/12 0010  COLORURINE YELLOW  LABSPEC 1.025  PHURINE 5.5  GLUCOSEU NEGATIVE  HGBUR MODERATE*  BILIRUBINUR NEGATIVE  KETONESUR NEGATIVE  PROTEINUR NEGATIVE  UROBILINOGEN 0.2  NITRITE NEGATIVE  LEUKOCYTESUR NEGATIVE   Misc. Labs:  ABGS No results found for this basename: PHART,PCO2,PO2ART,TCO2,HCO3 in the last 72 hours CULTURES Recent Results (from the past 240 hour(s))  CULTURE, BLOOD (ROUTINE X 2)     Status: Normal (Preliminary result)   Collection Time   11/23/12  1:03 PM      Component Value Range Status Comment   Specimen Description BLOOD RIGHT ANTECUBITAL   Final    Special Requests     Final    Value: BOTTLES  DRAWN AEROBIC AND ANAEROBIC AEB=10CC ANA=6CC   Culture NO GROWTH 2 DAYS   Final    Report Status PENDING   Incomplete   CULTURE, BLOOD (ROUTINE X 2)     Status: Normal (Preliminary result)   Collection Time   11/23/12  1:08 PM      Component Value Range Status Comment   Specimen Description BLOOD LEFT ANTECUBITAL   Final    Special Requests     Final    Value: BOTTLES DRAWN AEROBIC AND ANAEROBIC AEB=12CC ANA=7CC   Culture NO GROWTH 2 DAYS    Final    Report Status PENDING   Incomplete   CULTURE, EXPECTORATED SPUTUM-ASSESSMENT     Status: Normal   Collection Time   11/25/12 10:48 AM      Component Value Range Status Comment   Specimen Description SPUTUM   Final    Special Requests Normal   Final    Sputum evaluation     Final    Value: THIS SPECIMEN IS ACCEPTABLE. RESPIRATORY CULTURE REPORT TO FOLLOW.   Report Status 11/25/2012 FINAL   Final    Studies/Results: No results found.  Medications:  Prior to Admission:  Prescriptions prior to admission  Medication Sig Dispense Refill  . aspirin 81 MG EC tablet Take 81 mg by mouth daily.        Marland Kitchen donepezil (ARICEPT ODT) 10 MG disintegrating tablet Take 10 mg by mouth daily.        . ergocalciferol (VITAMIN D2) 50000 UNITS capsule Take 50,000 Units by mouth once a week.       . furosemide (LASIX) 20 MG tablet Take 1 tablet (20 mg total) by mouth daily.  30 tablet  3  . levofloxacin (LEVAQUIN) 500 MG tablet Take 500 mg by mouth daily. For 10 days (started on 11/22/12)      . LIPITOR 40 MG tablet TAKE ONE TABLET DAILY.  30 each  6  . LOTREL 5-20 MG per capsule TAKE ONE CAPSULE DAILY.  30 each  6  . Melatonin 3 MG TABS Take 3 mg by mouth at bedtime.        . metoprolol (LOPRESSOR) 50 MG tablet Take 0.5 tablets (25 mg total) by mouth 2 (two) times daily.  60 tablet  3  . Multiple Vitamins-Minerals (OCUVITE EXTRA) TABS Take 1 tablet by mouth daily.        . potassium chloride (K-DUR,KLOR-CON) 10 MEQ tablet Take 1 tablet (10 mEq total) by mouth daily.  30 tablet  6  . pregabalin (LYRICA) 75 MG capsule Take 75 mg by mouth every other day. At bedtime       . warfarin (COUMADIN) 5 MG tablet Take 5 mg by mouth daily. Take 5 mg (1 tablet) on Monday, Wed, and Friday, then take 2.5 mg (1/2 tablet) all other days      . ZYMAXID 0.5 % SOLN Place 1 drop into the left eye as needed. Around every 6 weeks for 3 days after Avastin Shot       Scheduled:   . amLODipine  5 mg Oral Daily   And  .  benazepril  20 mg Oral Daily  . aspirin EC  81 mg Oral Daily  . atorvastatin  40 mg Oral q1800  . beta carotene w/minerals  1 tablet Oral Daily  . donepezil  10 mg Oral Daily  . feeding supplement  237 mL Oral BID BM  . furosemide  20 mg Oral Daily  . gatifloxacin  1 drop Left Eye QID  . levofloxacin (LEVAQUIN) IV  250 mg Intravenous Q24H  . metoprolol  12.5 mg Oral BID  . potassium chloride  20 mEq Oral BID  . pregabalin  75 mg Oral QODAY  . sodium chloride  3 mL Intravenous Q12H  . vancomycin  500 mg Intravenous Q24H  . Warfarin - Pharmacist Dosing Inpatient   Does not apply q1800   Continuous:   . sodium chloride 125 mL/hr at 11/25/12 1030   WUJ:[id number], acetaminophen, alum & mag hydroxide-simeth, benzonatate, guaiFENesin-dextromethorphan, ondansetron (ZOFRAN) IV, ondansetron, traZODone  Assesment: She has clinical pneumonia. This does not show on her chest x-ray but she has cough congestion a very high white blood cell count and changes on her pulmonary examination. She has a history of aortic valve replacement. She has a history of coronary artery bypass grafting but her heart situation seems stable now. She has peripheral arterial disease. Principal Problem:  *Pneumonia Active Problems:  HYPERTENSION, UNSPECIFIED  CAD, ARTERY BYPASS GRAFT  CAROTID ARTERY STENOSIS, WITHOUT INFARCTION  CVA  AORTIC VALVE REPLACEMENT, HX OF    Plan: she will continue with IV antibiotics. I think she's getting better but slowly. She will have repeat laboratory work tomorrow    LOS: 3 days   Roxene Alviar L 11/26/2012, 8:17 AM

## 2012-11-27 DIAGNOSIS — F0390 Unspecified dementia without behavioral disturbance: Secondary | ICD-10-CM | POA: Diagnosis present

## 2012-11-27 DIAGNOSIS — E860 Dehydration: Secondary | ICD-10-CM | POA: Diagnosis present

## 2012-11-27 LAB — CBC WITH DIFFERENTIAL/PLATELET
Basophils Absolute: 0 10*3/uL (ref 0.0–0.1)
Basophils Relative: 0 % (ref 0–1)
Eosinophils Relative: 0 % (ref 0–5)
HCT: 28.8 % — ABNORMAL LOW (ref 36.0–46.0)
MCHC: 33 g/dL (ref 30.0–36.0)
Monocytes Absolute: 1.4 10*3/uL — ABNORMAL HIGH (ref 0.1–1.0)
Neutro Abs: 10.1 10*3/uL — ABNORMAL HIGH (ref 1.7–7.7)
RDW: 14.2 % (ref 11.5–15.5)

## 2012-11-27 LAB — PROTIME-INR: INR: 2.48 — ABNORMAL HIGH (ref 0.00–1.49)

## 2012-11-27 LAB — CULTURE, RESPIRATORY W GRAM STAIN

## 2012-11-27 MED ORDER — VANCOMYCIN HCL 500 MG IV SOLR
500.0000 mg | Freq: Two times a day (BID) | INTRAVENOUS | Status: DC
  Administered 2012-11-27 – 2012-11-28 (×3): 500 mg via INTRAVENOUS
  Filled 2012-11-27 (×5): qty 500

## 2012-11-27 MED ORDER — FUROSEMIDE 10 MG/ML IJ SOLN
40.0000 mg | Freq: Once | INTRAMUSCULAR | Status: AC
  Administered 2012-11-28: 40 mg via INTRAVENOUS
  Filled 2012-11-27: qty 4

## 2012-11-27 MED ORDER — WARFARIN SODIUM 5 MG PO TABS
5.0000 mg | ORAL_TABLET | Freq: Once | ORAL | Status: AC
  Administered 2012-11-27: 5 mg via ORAL
  Filled 2012-11-27: qty 1

## 2012-11-27 MED ORDER — FUROSEMIDE 10 MG/ML IJ SOLN
40.0000 mg | Freq: Once | INTRAMUSCULAR | Status: AC
  Administered 2012-11-27: 40 mg via INTRAVENOUS
  Filled 2012-11-27: qty 4

## 2012-11-27 NOTE — Progress Notes (Signed)
ANTICOAGULATION CONSULT NOTE  Pharmacy Consult for Coumadin Indication: AVR  Allergies  Allergen Reactions  . Codeine   . Penicillins    Patient Measurements: Height: 5\' 3"  (160 cm) Weight: 116 lb 10 oz (52.9 kg) IBW/kg (Calculated) : 52.4   Vital Signs: Temp: 97.9 F (36.6 C) (01/13 0437) Temp src: Oral (01/13 0437) BP: 146/57 mmHg (01/13 0437) Pulse Rate: 85  (01/13 0437)  Labs:  Basename 11/27/12 0618 11/26/12 0740 11/25/12 0601  HGB 9.5* -- 10.4*  HCT 28.8* -- 31.8*  PLT 275 -- 277  APTT -- -- --  LABPROT 25.7* 25.3* 32.7*  INR 2.48* 2.43* 3.44*  HEPARINUNFRC -- -- --  CREATININE -- -- 0.87  CKTOTAL -- -- --  CKMB -- -- --  TROPONINI -- -- --    Estimated Creatinine Clearance: 39.8 ml/min (by C-G formula based on Cr of 0.87).  Medical History: Past Medical History  Diagnosis Date  . Encounter for long-term (current) use of other medications   . Carotid artery stenosis     Without infarction  . Stroke   . Hypertension     Unspecified  . Coronary artery disease   . Hypercholesterolemia     Pure  . Mixed hyperlipidemia   . LBBB (left bundle branch block)    Medications:  Prescriptions prior to admission  Medication Sig Dispense Refill  . aspirin 81 MG EC tablet Take 81 mg by mouth daily.        Marland Kitchen donepezil (ARICEPT ODT) 10 MG disintegrating tablet Take 10 mg by mouth daily.        . ergocalciferol (VITAMIN D2) 50000 UNITS capsule Take 50,000 Units by mouth once a week.       . furosemide (LASIX) 20 MG tablet Take 1 tablet (20 mg total) by mouth daily.  30 tablet  3  . levofloxacin (LEVAQUIN) 500 MG tablet Take 500 mg by mouth daily. For 10 days (started on 11/22/12)      . LIPITOR 40 MG tablet TAKE ONE TABLET DAILY.  30 each  6  . LOTREL 5-20 MG per capsule TAKE ONE CAPSULE DAILY.  30 each  6  . Melatonin 3 MG TABS Take 3 mg by mouth at bedtime.        . metoprolol (LOPRESSOR) 50 MG tablet Take 0.5 tablets (25 mg total) by mouth 2 (two) times daily.   60 tablet  3  . Multiple Vitamins-Minerals (OCUVITE EXTRA) TABS Take 1 tablet by mouth daily.        . potassium chloride (K-DUR,KLOR-CON) 10 MEQ tablet Take 1 tablet (10 mEq total) by mouth daily.  30 tablet  6  . pregabalin (LYRICA) 75 MG capsule Take 75 mg by mouth every other day. At bedtime       . warfarin (COUMADIN) 5 MG tablet Take 5 mg by mouth daily. Take 5 mg (1 tablet) on Monday, Wed, and Friday, then take 2.5 mg (1/2 tablet) all other days      . ZYMAXID 0.5 % SOLN Place 1 drop into the left eye as needed. Around every 6 weeks for 3 days after Avastin Shot        Assessment: 79yo female on chronic coumadin for hx AVR.  INR was supra-therapeutic on admission.  It is now just below goal range after being held x 2 days. No bleeding noted.     Goal of Therapy:  INR 2.5-3.5   Plan: Coumadin 5mg  po today x1  INR daily  Jeyson Deshotel,  [facility] 11/27/2012,7:42 AM

## 2012-11-27 NOTE — Clinical Documentation Improvement (Signed)
MALNUTRITION DOCUMENTATION CLARIFICATION  THIS DOCUMENT IS NOT A PERMANENT PART OF THE MEDICAL RECORD  TO RESPOND TO THE THIS QUERY, FOLLOW THE INSTRUCTIONS BELOW:  1. If needed, update documentation for the patient's encounter via the notes activity.  2. Access this query again and click edit on the In [employer].  3. After updating, or not, click F2 to complete all highlighted (required) fields concerning your review. Select "additional documentation in the medical record" OR "no additional documentation provided".  4. Click Sign note button.  5. The deficiency will fall out of your In Basket *Please let us know if you are not able to complete this workflow by phone or e-mail (listed below).  Please update your documentation within the medical record to reflect your response to this query.                                                                                        11/27/12   Dear Dr. [name] / Associates,  In a better effort to capture your patient's severity of illness, reflect appropriate length of stay and utilization of resources, a review of the patient medical record has revealed the following indicators.    Based on your clinical judgment, please clarify and document in a progress note and/or discharge summary the clinical condition associated with the following supporting information:  In responding to this query please exercise your independent judgment.  The fact that a query is asked, does not imply that any particular answer is desired or expected.  Please clarify nutritional status  Possible Clinical Conditions?  Mild Malnutrition  Moderate Malnutrition Severe Malnutrition   Protein Calorie Malnutrition Severe Protein Calorie Malnutrition Other Condition________________ Cannot clinically determine   Clinical Information:  Risk Factors: Per RD assessment: "Pt meets criteria for severe MALNUTRITION in the context of acute illness as evidenced by Wt  loss >5% in 30 days and energy intake </= for 50%/= 5 days".  Poor appetite and po intake past week. Age: 79 Shortness of breath Cough  Signs & Symptoms: -Ht: 5\' 3"  Wt: 104 lbs -BMI: 18.42-underweight -Weight Loss 10 lbs over 1 month  Treatments: Heart diet daily weights Monitoring I&O  -Nutrition Consult: NUTRITION DIAGNOSIS: Inadequate oral intake related to decrease in appetite as evidenced by pneumonia and pt diet hx review.  Goal: Pt to meet >/= 90% of their estimated nutrition needs; not met Monitor: Meals and supplements, labs and wt trends NTERVENTION: Boost Plus BID provides 350 kcal, 14 gr protein each    You may use possible, probable, or suspect with inpatient documentation. possible, probable, suspected diagnoses MUST be documented at the time of discharge  Reviewed: additional documentation in the medical record  Thank You,  Debora T Williams RN, MSN Clinical Documentation Specialist: Office# [phone] [facility] Health Information Management [place]

## 2012-11-27 NOTE — Progress Notes (Signed)
Subjective: She is improving. She still complains of cough. She lists codeine allergy so I'm limited in what I can give her for cough. She does say the cough medication helps. She has some swelling of her ankles.  Objective: Vital signs in last 24 hours: Temp:  [97.9 F (36.6 C)-98.9 F (37.2 C)] 97.9 F (36.6 C) (01/13 0437) Pulse Rate:  [85-87] 85  (01/13 0437) Resp:  [20-22] 21  (01/13 0437) BP: (124-156)/(42-65) 146/57 mmHg (01/13 0437) SpO2:  [94 %-100 %] 94 % (01/13 0437) Weight:  [52.9 kg (116 lb 10 oz)] 52.9 kg (116 lb 10 oz) (01/13 0437) Weight change: 0.7 kg (1 lb 8.7 oz) Last BM Date: 11/26/12  Intake/Output from previous day: 01/12 0701 - 01/13 0700 In: 11456.7 [P.O.:640; I.V.:10416.7; IV Piggyback:400] Out: -   PHYSICAL EXAM General appearance: alert, cooperative and mild distress Resp: rhonchi bilaterally Cardio: regular rate and rhythm, S1, S2 normal, no murmur, click, rub or gallop GI: soft, non-tender; bowel sounds normal; no masses,  no organomegaly Extremities: edema 1+  Lab Results:    Basic Metabolic Panel:  Basename 11/25/12 0601  NA 142  K 3.3*  CL 108  CO2 28  GLUCOSE 102*  BUN 10  CREATININE 0.87  CALCIUM 8.4  MG --  PHOS --   Liver Function Tests: No results found for this basename: AST:2,ALT:2,ALKPHOS:2,BILITOT:2,PROT:2,ALBUMIN:2 in the last 72 hours No results found for this basename: LIPASE:2,AMYLASE:2 in the last 72 hours No results found for this basename: AMMONIA:2 in the last 72 hours CBC:  Basename 11/27/12 0618 11/25/12 0601  WBC 12.7* 24.1*  NEUTROABS 10.1* 20.7*  HGB 9.5* 10.4*  HCT 28.8* 31.8*  MCV 93.2 94.4  PLT 275 277   Cardiac Enzymes: No results found for this basename: CKTOTAL:3,CKMB:3,CKMBINDEX:3,TROPONINI:3 in the last 72 hours BNP: No results found for this basename: PROBNP:3 in the last 72 hours D-Dimer: No results found for this basename: DDIMER:2 in the last 72 hours CBG: No results found for this  basename: GLUCAP:6 in the last 72 hours Hemoglobin A1C: No results found for this basename: HGBA1C in the last 72 hours Fasting Lipid Panel: No results found for this basename: CHOL,HDL,LDLCALC,TRIG,CHOLHDL,LDLDIRECT in the last 72 hours Thyroid Function Tests: No results found for this basename: TSH,T4TOTAL,FREET4,T3FREE,THYROIDAB in the last 72 hours Anemia Panel: No results found for this basename: VITAMINB12,FOLATE,FERRITIN,TIBC,IRON,RETICCTPCT in the last 72 hours Coagulation:  Basename 11/27/12 0618 11/26/12 0740  LABPROT 25.7* 25.3*  INR 2.48* 2.43*   Urine Drug Screen: Drugs of Abuse  No results found for this basename: labopia, cocainscrnur, labbenz, amphetmu, thcu, labbarb    Alcohol Level: No results found for this basename: ETH:2 in the last 72 hours Urinalysis:  Basename 11/25/12 0010  COLORURINE YELLOW  LABSPEC 1.025  PHURINE 5.5  GLUCOSEU NEGATIVE  HGBUR MODERATE*  BILIRUBINUR NEGATIVE  KETONESUR NEGATIVE  PROTEINUR NEGATIVE  UROBILINOGEN 0.2  NITRITE NEGATIVE  LEUKOCYTESUR NEGATIVE   Misc. Labs:  ABGS No results found for this basename: PHART,PCO2,PO2ART,TCO2,HCO3 in the last 72 hours CULTURES Recent Results (from the past 240 hour(s))  CULTURE, BLOOD (ROUTINE X 2)     Status: Normal (Preliminary result)   Collection Time   11/23/12  1:03 PM      Component Value Range Status Comment   Specimen Description BLOOD RIGHT ANTECUBITAL   Final    Special Requests     Final    Value: BOTTLES DRAWN AEROBIC AND ANAEROBIC AEB=10CC ANA=6CC   Culture NO GROWTH 3 DAYS   Final  Report Status PENDING   Incomplete   CULTURE, BLOOD (ROUTINE X 2)     Status: Normal (Preliminary result)   Collection Time   11/23/12  1:08 PM      Component Value Range Status Comment   Specimen Description BLOOD LEFT ANTECUBITAL   Final    Special Requests     Final    Value: BOTTLES DRAWN AEROBIC AND ANAEROBIC AEB=12CC ANA=7CC   Culture NO GROWTH 3 DAYS   Final    Report Status  PENDING   Incomplete   CULTURE, EXPECTORATED SPUTUM-ASSESSMENT     Status: Normal   Collection Time   11/25/12 10:48 AM      Component Value Range Status Comment   Specimen Description SPUTUM   Final    Special Requests Normal   Final    Sputum evaluation     Final    Value: THIS SPECIMEN IS ACCEPTABLE. RESPIRATORY CULTURE REPORT TO FOLLOW.   Report Status 11/25/2012 FINAL   Final   CULTURE, RESPIRATORY     Status: Normal (Preliminary result)   Collection Time   11/25/12 10:48 AM      Component Value Range Status Comment   Specimen Description SPUTUM   Final    Special Requests NONE   Final    Gram Stain PENDING   Incomplete    Culture Culture reincubated for better growth   Final    Report Status PENDING   Incomplete    Studies/Results: No results found.  Medications:  Prior to Admission:  Prescriptions prior to admission  Medication Sig Dispense Refill  . aspirin 81 MG EC tablet Take 81 mg by mouth daily.        Marland Kitchen donepezil (ARICEPT ODT) 10 MG disintegrating tablet Take 10 mg by mouth daily.        . ergocalciferol (VITAMIN D2) 50000 UNITS capsule Take 50,000 Units by mouth once a week.       . furosemide (LASIX) 20 MG tablet Take 1 tablet (20 mg total) by mouth daily.  30 tablet  3  . levofloxacin (LEVAQUIN) 500 MG tablet Take 500 mg by mouth daily. For 10 days (started on 11/22/12)      . LIPITOR 40 MG tablet TAKE ONE TABLET DAILY.  30 each  6  . LOTREL 5-20 MG per capsule TAKE ONE CAPSULE DAILY.  30 each  6  . Melatonin 3 MG TABS Take 3 mg by mouth at bedtime.        . metoprolol (LOPRESSOR) 50 MG tablet Take 0.5 tablets (25 mg total) by mouth 2 (two) times daily.  60 tablet  3  . Multiple Vitamins-Minerals (OCUVITE EXTRA) TABS Take 1 tablet by mouth daily.        . potassium chloride (K-DUR,KLOR-CON) 10 MEQ tablet Take 1 tablet (10 mEq total) by mouth daily.  30 tablet  6  . pregabalin (LYRICA) 75 MG capsule Take 75 mg by mouth every other day. At bedtime       . warfarin  (COUMADIN) 5 MG tablet Take 5 mg by mouth daily. Take 5 mg (1 tablet) on Monday, Wed, and Friday, then take 2.5 mg (1/2 tablet) all other days      . ZYMAXID 0.5 % SOLN Place 1 drop into the left eye as needed. Around every 6 weeks for 3 days after Avastin Shot       Scheduled:   . amLODipine  5 mg Oral Daily   And  . benazepril  20  mg Oral Daily  . aspirin EC  81 mg Oral Daily  . atorvastatin  40 mg Oral q1800  . beta carotene w/minerals  1 tablet Oral Daily  . donepezil  10 mg Oral Daily  . feeding supplement  237 mL Oral BID BM  . furosemide  40 mg Intravenous Once  . furosemide  20 mg Oral Daily  . levofloxacin (LEVAQUIN) IV  250 mg Intravenous Q24H  . metoprolol  12.5 mg Oral BID  . potassium chloride  20 mEq Oral BID  . pregabalin  75 mg Oral QODAY  . sodium chloride  3 mL Intravenous Q12H  . vancomycin  500 mg Intravenous Q12H  . warfarin  5 mg Oral ONCE-1800  . Warfarin - Pharmacist Dosing Inpatient   Does not apply q1800   Continuous:   . sodium chloride 125 mL/hr at 11/26/12 2230   AVW:[id number], acetaminophen, alum & mag hydroxide-simeth, benzonatate, guaiFENesin-dextromethorphan, ondansetron (ZOFRAN) IV, ondansetron, traZODone  Assesment: She is being treated for pneumonia. She was negative for influenza. She was dehydrated on admission and I think now she is slightly volume overloaded. Her coronary disease is stable. Her peripheral arterial disease stable Principal Problem:  *Pneumonia Active Problems:  HYPERTENSION, UNSPECIFIED  CAD, ARTERY BYPASS GRAFT  CAROTID ARTERY STENOSIS, WITHOUT INFARCTION  CVA  AORTIC VALVE REPLACEMENT, HX OF    Plan: Discontinue IV fluids she will get a dose of Lasix she will continue with other treatments.    LOS: 4 days   Imran Nuon L 11/27/2012, 8:46 AM

## 2012-11-27 NOTE — Progress Notes (Signed)
ANTIBIOTIC CONSULT NOTE  Pharmacy Consult for Vancomycin Indication: pneumonia  Allergies  Allergen Reactions  . Codeine   . Penicillins     Patient Measurements: Height: 5\' 3"  (160 cm) Weight: 116 lb 10 oz (52.9 kg) IBW/kg (Calculated) : 52.4    Vital Signs: Temp: 97.9 F (36.6 C) (01/13 0437) Temp src: Oral (01/13 0437) BP: 146/57 mmHg (01/13 0437) Pulse Rate: 85  (01/13 0437) Intake/Output from previous day: 01/12 0701 - 01/13 0700 In: 11456.7 [P.O.:640; I.V.:10416.7; IV Piggyback:400] Out: -  Intake/Output from this shift:    Labs:  [facility] 11/27/12 0618 11/25/12 0601  WBC 12.7* 24.1*  HGB 9.5* 10.4*  PLT 275 277  LABCREA -- --  CREATININE -- 0.87   Estimated Creatinine Clearance: 39.8 ml/min (by C-G formula based on Cr of 0.87).  Basename 11/26/12 1844  VANCOTROUGH <5.0*  VANCOPEAK --  Drue Dun --  GENTTROUGH --  GENTPEAK --  GENTRANDOM --  TOBRATROUGH --  TOBRAPEAK --  TOBRARND --  AMIKACINPEAK --  AMIKACINTROU --  AMIKACIN --     Microbiology: Recent Results (from the past 720 hour(s))  CULTURE, BLOOD (ROUTINE X 2)     Status: Normal (Preliminary result)   Collection Time   11/23/12  1:03 PM      Component Value Range Status Comment   Specimen Description BLOOD RIGHT ANTECUBITAL   Final    Special Requests     Final    Value: BOTTLES DRAWN AEROBIC AND ANAEROBIC AEB=10CC ANA=6CC   Culture NO GROWTH 3 DAYS   Final    Report Status PENDING   Incomplete   CULTURE, BLOOD (ROUTINE X 2)     Status: Normal (Preliminary result)   Collection Time   11/23/12  1:08 PM      Component Value Range Status Comment   Specimen Description BLOOD LEFT ANTECUBITAL   Final    Special Requests     Final    Value: BOTTLES DRAWN AEROBIC AND ANAEROBIC AEB=12CC ANA=7CC   Culture NO GROWTH 3 DAYS   Final    Report Status PENDING   Incomplete   CULTURE, EXPECTORATED SPUTUM-ASSESSMENT     Status: Normal   Collection Time   11/25/12 10:48 AM      Component Value  Range Status Comment   Specimen Description SPUTUM   Final    Special Requests Normal   Final    Sputum evaluation     Final    Value: THIS SPECIMEN IS ACCEPTABLE. RESPIRATORY CULTURE REPORT TO FOLLOW.   Report Status 11/25/2012 FINAL   Final   CULTURE, RESPIRATORY     Status: Normal (Preliminary result)   Collection Time   11/25/12 10:48 AM      Component Value Range Status Comment   Specimen Description SPUTUM   Final    Special Requests NONE   Final    Gram Stain PENDING   Incomplete    Culture Culture reincubated for better growth   Final    Report Status PENDING   Incomplete     Medical History: Past Medical History  Diagnosis Date  . Encounter for long-term (current) use of other medications   . Carotid artery stenosis     Without infarction  . Stroke   . Hypertension     Unspecified  . Coronary artery disease   . Hypercholesterolemia     Pure  . Mixed hyperlipidemia   . LBBB (left bundle branch block)     Medications:  Scheduled:     .  amLODipine  5 mg Oral Daily   And  . benazepril  20 mg Oral Daily  . aspirin EC  81 mg Oral Daily  . atorvastatin  40 mg Oral q1800  . beta carotene w/minerals  1 tablet Oral Daily  . donepezil  10 mg Oral Daily  . feeding supplement  237 mL Oral BID BM  . furosemide  20 mg Oral Daily  . levofloxacin (LEVAQUIN) IV  250 mg Intravenous Q24H  . metoprolol  12.5 mg Oral BID  . potassium chloride  20 mEq Oral BID  . pregabalin  75 mg Oral QODAY  . sodium chloride  3 mL Intravenous Q12H  . vancomycin  500 mg Intravenous Q12H  . [COMPLETED] warfarin  5 mg Oral ONCE-1800  . warfarin  5 mg Oral ONCE-1800  . Warfarin - Pharmacist Dosing Inpatient   Does not apply q1800  . [DISCONTINUED] gatifloxacin  1 drop Left Eye QID  . [DISCONTINUED] vancomycin  500 mg Intravenous Q24H   Assessment: 79 yo F on day#5 Vancomycin & Levaquin for PNA.  She is clinically improving.  Cx data negative to date.  Her renal function has improved to  baseline.   Vancomycin trough undetectable prior to last night's dose.   Goal of Therapy:  Vancomycin trough level 15-20 mcg/ml  Plan:  Increase Vancomycin 500 mg IV every 12 hours Recheck trough level weekly Continue Levaquin 250mg  IV Q24h Monitor renal function and cx data  Duration of therapy per MD - recommend 8 days  Elson Clan 11/27/2012,7:43 AM

## 2012-11-28 DIAGNOSIS — I509 Heart failure, unspecified: Secondary | ICD-10-CM | POA: Diagnosis present

## 2012-11-28 DIAGNOSIS — E441 Mild protein-calorie malnutrition: Secondary | ICD-10-CM | POA: Diagnosis present

## 2012-11-28 LAB — CULTURE, BLOOD (ROUTINE X 2)
Culture: NO GROWTH
Culture: NO GROWTH

## 2012-11-28 LAB — CBC WITH DIFFERENTIAL/PLATELET
Lymphocytes Relative: 15 % (ref 12–46)
Lymphs Abs: 1.6 10*3/uL (ref 0.7–4.0)
Neutrophils Relative %: 72 % (ref 43–77)
Platelets: 315 10*3/uL (ref 150–400)
RBC: 3.24 MIL/uL — ABNORMAL LOW (ref 3.87–5.11)
WBC: 10.3 10*3/uL (ref 4.0–10.5)

## 2012-11-28 LAB — PROTIME-INR: Prothrombin Time: 29 seconds — ABNORMAL HIGH (ref 11.6–15.2)

## 2012-11-28 MED ORDER — WARFARIN SODIUM 2.5 MG PO TABS
2.5000 mg | ORAL_TABLET | Freq: Every day | ORAL | Status: DC
  Administered 2012-11-28: 2.5 mg via ORAL
  Filled 2012-11-28: qty 1

## 2012-11-28 MED ORDER — BOOST / RESOURCE BREEZE PO LIQD: 1.0000 | Freq: Two times a day (BID) | ORAL | Status: DC

## 2012-11-28 MED ORDER — VANCOMYCIN HCL IN DEXTROSE 1-5 GM/200ML-% IV SOLN
1000.0000 mg | INTRAVENOUS | Status: DC
  Administered 2012-11-29: 1000 mg via INTRAVENOUS
  Filled 2012-11-28 (×2): qty 200

## 2012-11-28 MED ORDER — VANCOMYCIN HCL 500 MG IV SOLR
500.0000 mg | Freq: Once | INTRAVENOUS | Status: AC
  Administered 2012-11-28: 500 mg via INTRAVENOUS
  Filled 2012-11-28: qty 500

## 2012-11-28 NOTE — Evaluation (Signed)
Physical Therapy Evaluation Patient Details Name: [name] MRN: [id number] DOB: 08/25/1926 Today's Date: 11/28/2012 Time: 7829-5621 PT Time Calculation (min): 15 min  PT Assessment / Plan / Recommendation Clinical Impression  Pt is an 79 year old active female admitted to [facility] for pnemonia and referred to PT for general mobiliyt.  At this time she is mod I-I with all activities and maintains 93% O2 on RA after ambulation.  At this time recommend OP PT for general strengthening in order for her to feel safe returning to her gym activities.     PT Assessment  Patent does not need any further PT services    Follow Up Recommendations  Outpatient PT    Does the patient have the potential to tolerate intense rehabilitation      Barriers to Discharge        Equipment Recommendations       Recommendations for Other Services     Frequency      Precautions / Restrictions Restrictions Weight Bearing Restrictions: No   Pertinent Vitals/Pain O2: 93% on RA after ambulation      Mobility  Transfers Transfers: Sit to Stand;Stand to Sit Sit to Stand: 6: Modified independent (Device/Increase time);With armrests;From chair/3-in-1 Stand to Sit: 6: Modified independent (Device/Increase time);To chair/3-in-1 Ambulation/Gait Ambulation/Gait Assistance: 6: Modified independent (Device/Increase time) Ambulation Distance (Feet): 500 Feet Gait Pattern: Within Functional Limits Gait velocity: normal    Shoulder Instructions     Exercises     PT Diagnosis:    PT Problem List:   PT Treatment Interventions:     PT Goals    Visit Information  Last PT Received On: 11/28/12    Subjective Data  Subjective: I got myself over to the chair.  Patient Stated Goal: Plan to go home   Prior Functioning  Home Living Lives With: Alone Type of Home: House Home Access: Stairs to enter;Ramped entrance Entrance Stairs-Number of Steps: 3 Entrance Stairs-Rails: Right Home Layout: One  level Bathroom Shower/Tub: Health visitor: Handicapped height Home Adaptive Equipment: Environmental consultant - standard Prior Function Level of Independence: Independent Able to Take Stairs?: Reciprically Driving: Yes Comments: active at her gym.     Cognition       Extremity/Trunk Assessment Right Lower Extremity Assessment RLE ROM/Strength/Tone: Within functional levels Left Lower Extremity Assessment LLE ROM/Strength/Tone: Within functional levels   Balance    End of Session PT - End of Session Equipment Utilized During Treatment: Gait belt Activity Tolerance: Patient tolerated treatment well Patient left: in chair;with call bell/phone within reach;with family/visitor present    Anthonie Lotito, PT 11/28/2012, 12:22 PM

## 2012-11-28 NOTE — Progress Notes (Signed)
Subjective: She says she feels better. She's not coughing quite as much. Her ankle swelling is better.  Objective: Vital signs in last 24 hours: Temp:  [97.7 F (36.5 C)-98.1 F (36.7 C)] 97.7 F (36.5 C) (01/14 0605) Pulse Rate:  [85-90] 85  (01/14 0605) Resp:  [19-20] 19  (01/14 0605) BP: (133-145)/(56-63) 145/56 mmHg (01/14 0605) SpO2:  [93 %] 93 % (01/14 0605) Weight:  [51.529 kg (113 lb 9.6 oz)-52.9 kg (116 lb 10 oz)] 51.529 kg (113 lb 9.6 oz) (01/14 0610) Weight change: 0 kg (0 lb) Last BM Date: 11/27/12  Intake/Output from previous day: 01/13 0701 - 01/14 0700 In: 1412.8 [P.O.:480; I.V.:728.8; IV Piggyback:204] Out: -   PHYSICAL EXAM General appearance: alert, cooperative and mild distress Resp: rhonchi bilaterally Cardio: Prosthetic heart valve sound GI: soft, non-tender; bowel sounds normal; no masses,  no organomegaly Extremities: Trace edema of her feet  Lab Results:    Basic Metabolic Panel: No results found for this basename: NA:2,K:2,CL:2,CO2:2,GLUCOSE:2,BUN:2,CREATININE:2,CALCIUM:2,MG:2,PHOS:2 in the last 72 hours Liver Function Tests: No results found for this basename: AST:2,ALT:2,ALKPHOS:2,BILITOT:2,PROT:2,ALBUMIN:2 in the last 72 hours No results found for this basename: LIPASE:2,AMYLASE:2 in the last 72 hours No results found for this basename: AMMONIA:2 in the last 72 hours CBC:  Basename 11/28/12 0549 11/27/12 0618  WBC 10.3 12.7*  NEUTROABS 7.4 10.1*  HGB 10.0* 9.5*  HCT 30.0* 28.8*  MCV 92.6 93.2  PLT 315 275   Cardiac Enzymes: No results found for this basename: CKTOTAL:3,CKMB:3,CKMBINDEX:3,TROPONINI:3 in the last 72 hours BNP: No results found for this basename: PROBNP:3 in the last 72 hours D-Dimer: No results found for this basename: DDIMER:2 in the last 72 hours CBG: No results found for this basename: GLUCAP:6 in the last 72 hours Hemoglobin A1C: No results found for this basename: HGBA1C in the last 72 hours Fasting Lipid  Panel: No results found for this basename: CHOL,HDL,LDLCALC,TRIG,CHOLHDL,LDLDIRECT in the last 72 hours Thyroid Function Tests: No results found for this basename: TSH,T4TOTAL,FREET4,T3FREE,THYROIDAB in the last 72 hours Anemia Panel: No results found for this basename: VITAMINB12,FOLATE,FERRITIN,TIBC,IRON,RETICCTPCT in the last 72 hours Coagulation:  Basename 11/28/12 0549 11/27/12 0618  LABPROT 29.0* 25.7*  INR 2.92* 2.48*   Urine Drug Screen: Drugs of Abuse  No results found for this basename: labopia, cocainscrnur, labbenz, amphetmu, thcu, labbarb    Alcohol Level: No results found for this basename: ETH:2 in the last 72 hours Urinalysis: No results found for this basename: COLORURINE:2,APPERANCEUR:2,LABSPEC:2,PHURINE:2,GLUCOSEU:2,HGBUR:2,BILIRUBINUR:2,KETONESUR:2,PROTEINUR:2,UROBILINOGEN:2,NITRITE:2,LEUKOCYTESUR:2 in the last 72 hours Misc. Labs:  ABGS No results found for this basename: PHART,PCO2,PO2ART,TCO2,HCO3 in the last 72 hours CULTURES Recent Results (from the past 240 hour(s))  CULTURE, BLOOD (ROUTINE X 2)     Status: Normal (Preliminary result)   Collection Time   11/23/12  1:03 PM      Component Value Range Status Comment   Specimen Description BLOOD RIGHT ANTECUBITAL   Final    Special Requests     Final    Value: BOTTLES DRAWN AEROBIC AND ANAEROBIC AEB=10CC ANA=6CC   Culture NO GROWTH 4 DAYS   Final    Report Status PENDING   Incomplete   CULTURE, BLOOD (ROUTINE X 2)     Status: Normal (Preliminary result)   Collection Time   11/23/12  1:08 PM      Component Value Range Status Comment   Specimen Description BLOOD LEFT ANTECUBITAL   Final    Special Requests     Final    Value: BOTTLES DRAWN AEROBIC AND ANAEROBIC AEB=12CC ANA=7CC  Culture NO GROWTH 4 DAYS   Final    Report Status PENDING   Incomplete   CULTURE, EXPECTORATED SPUTUM-ASSESSMENT     Status: Normal   Collection Time   11/25/12 10:48 AM      Component Value Range Status Comment   Specimen  Description SPUTUM   Final    Special Requests Normal   Final    Sputum evaluation     Final    Value: THIS SPECIMEN IS ACCEPTABLE. RESPIRATORY CULTURE REPORT TO FOLLOW.   Report Status 11/25/2012 FINAL   Final   CULTURE, RESPIRATORY     Status: Normal   Collection Time   11/25/12 10:48 AM      Component Value Range Status Comment   Specimen Description SPUTUM   Final    Special Requests NONE   Final    Gram Stain     Final    Value: RARE WBC PRESENT, PREDOMINANTLY PMN     RARE SQUAMOUS EPITHELIAL CELLS PRESENT     FEW GRAM POSITIVE COCCI     IN PAIRS FEW GRAM POSITIVE COCCI     RARE YEAST   Culture NORMAL OROPHARYNGEAL FLORA   Final    Report Status 11/27/2012 FINAL   Final    Studies/Results: No results found.  Medications:  Prior to Admission:  Prescriptions prior to admission  Medication Sig Dispense Refill  . aspirin 81 MG EC tablet Take 81 mg by mouth daily.        Marland Kitchen donepezil (ARICEPT ODT) 10 MG disintegrating tablet Take 10 mg by mouth daily.        . ergocalciferol (VITAMIN D2) 50000 UNITS capsule Take 50,000 Units by mouth once a week.       . furosemide (LASIX) 20 MG tablet Take 1 tablet (20 mg total) by mouth daily.  30 tablet  3  . levofloxacin (LEVAQUIN) 500 MG tablet Take 500 mg by mouth daily. For 10 days (started on 11/22/12)      . LIPITOR 40 MG tablet TAKE ONE TABLET DAILY.  30 each  6  . LOTREL 5-20 MG per capsule TAKE ONE CAPSULE DAILY.  30 each  6  . Melatonin 3 MG TABS Take 3 mg by mouth at bedtime.        . metoprolol (LOPRESSOR) 50 MG tablet Take 0.5 tablets (25 mg total) by mouth 2 (two) times daily.  60 tablet  3  . Multiple Vitamins-Minerals (OCUVITE EXTRA) TABS Take 1 tablet by mouth daily.        . potassium chloride (K-DUR,KLOR-CON) 10 MEQ tablet Take 1 tablet (10 mEq total) by mouth daily.  30 tablet  6  . pregabalin (LYRICA) 75 MG capsule Take 75 mg by mouth every other day. At bedtime       . warfarin (COUMADIN) 5 MG tablet Take 5 mg by mouth  daily. Take 5 mg (1 tablet) on Monday, Wed, and Friday, then take 2.5 mg (1/2 tablet) all other days      . ZYMAXID 0.5 % SOLN Place 1 drop into the left eye as needed. Around every 6 weeks for 3 days after Avastin Shot       Scheduled:   . amLODipine  5 mg Oral Daily   And  . benazepril  20 mg Oral Daily  . aspirin EC  81 mg Oral Daily  . atorvastatin  40 mg Oral q1800  . beta carotene w/minerals  1 tablet Oral Daily  . donepezil  10  mg Oral Daily  . feeding supplement  237 mL Oral BID BM  . furosemide  20 mg Oral Daily  . levofloxacin (LEVAQUIN) IV  250 mg Intravenous Q24H  . metoprolol  12.5 mg Oral BID  . potassium chloride  20 mEq Oral BID  . pregabalin  75 mg Oral QODAY  . sodium chloride  3 mL Intravenous Q12H  . vancomycin  500 mg Intravenous Q12H  . Warfarin - Pharmacist Dosing Inpatient   Does not apply q1800   Continuous:   . sodium chloride 10 mL (11/27/12 1015)   UJW:JXBJYNWGNFAOZ, acetaminophen, alum & mag hydroxide-simeth, benzonatate, guaiFENesin-dextromethorphan, ondansetron (ZOFRAN) IV, ondansetron, traZODone  Assesment: Admitted with pneumonia. She seems to be getting better. Her dehydration has results. She still has some edema of her feet I'm going to give her another dose of IV Lasix. She has a history of aortic valve replacement and is on chronic anti-coagulation. She has coronary artery occlusive disease which is stable Principal Problem:  *Pneumonia Active Problems:  HYPERTENSION, UNSPECIFIED  CAD, ARTERY BYPASS GRAFT  CAROTID ARTERY STENOSIS, WITHOUT INFARCTION  CVA  AORTIC VALVE REPLACEMENT, HX OF  Dehydration  Dementia    Plan: For another dose of Lasix. She'll have PT consultation. Because she's having trouble coughing anything up I will have her try a flutter valve    LOS: 5 days   Shaylie Eklund L 11/28/2012, 8:29 AM

## 2012-11-28 NOTE — Progress Notes (Signed)
ANTICOAGULATION CONSULT NOTE  Pharmacy Consult for Coumadin Indication: AVR  Allergies  Allergen Reactions  . Codeine   . Penicillins    Patient Measurements: Height: 5\' 3"  (160 cm) Weight: 113 lb 9.6 oz (51.529 kg) IBW/kg (Calculated) : 52.4   Vital Signs: Temp: 97.7 F (36.5 C) (01/14 0605) Temp src: Oral (01/14 0605) BP: 145/56 mmHg (01/14 0605) Pulse Rate: 85  (01/14 0605)  Labs:  Basename 11/28/12 0549 11/27/12 0618 11/26/12 0740  HGB 10.0* 9.5* --  HCT 30.0* 28.8* --  PLT 315 275 --  APTT -- -- --  LABPROT 29.0* 25.7* 25.3*  INR 2.92* 2.48* 2.43*  HEPARINUNFRC -- -- --  CREATININE -- -- --  CKTOTAL -- -- --  CKMB -- -- --  TROPONINI -- -- --    Estimated Creatinine Clearance: 39.1 ml/min (by C-G formula based on Cr of 0.87).  Medical History: Past Medical History  Diagnosis Date  . Encounter for long-term (current) use of other medications   . Carotid artery stenosis     Without infarction  . Stroke   . Hypertension     Unspecified  . Coronary artery disease   . Hypercholesterolemia     Pure  . Mixed hyperlipidemia   . LBBB (left bundle branch block)    Medications:  Prescriptions prior to admission  Medication Sig Dispense Refill  . aspirin 81 MG EC tablet Take 81 mg by mouth daily.        Marland Kitchen donepezil (ARICEPT ODT) 10 MG disintegrating tablet Take 10 mg by mouth daily.        . ergocalciferol (VITAMIN D2) 50000 UNITS capsule Take 50,000 Units by mouth once a week.       . furosemide (LASIX) 20 MG tablet Take 1 tablet (20 mg total) by mouth daily.  30 tablet  3  . levofloxacin (LEVAQUIN) 500 MG tablet Take 500 mg by mouth daily. For 10 days (started on 11/22/12)      . LIPITOR 40 MG tablet TAKE ONE TABLET DAILY.  30 each  6  . LOTREL 5-20 MG per capsule TAKE ONE CAPSULE DAILY.  30 each  6  . Melatonin 3 MG TABS Take 3 mg by mouth at bedtime.        . metoprolol (LOPRESSOR) 50 MG tablet Take 0.5 tablets (25 mg total) by mouth 2 (two) times daily.   60 tablet  3  . Multiple Vitamins-Minerals (OCUVITE EXTRA) TABS Take 1 tablet by mouth daily.        . potassium chloride (K-DUR,KLOR-CON) 10 MEQ tablet Take 1 tablet (10 mEq total) by mouth daily.  30 tablet  6  . pregabalin (LYRICA) 75 MG capsule Take 75 mg by mouth every other day. At bedtime       . warfarin (COUMADIN) 5 MG tablet Take 5 mg by mouth daily. Take 5 mg (1 tablet) on Monday, Wed, and Friday, then take 2.5 mg (1/2 tablet) all other days      . ZYMAXID 0.5 % SOLN Place 1 drop into the left eye as needed. Around every 6 weeks for 3 days after Avastin Shot       Assessment: 79yo female on chronic coumadin for hx AVR.  INR was supra-therapeutic on admission but is now therapeutic.Marland Kitchen  Coumadin was held x 2 days after admitted due to supra-therapeutic INR. No bleeding noted.     Goal of Therapy:  INR 2.5-3.5   Plan: Coumadin 2.5mg  po daily for now. INR daily  Valrie Hart A 11/28/2012,1:36 PM

## 2012-11-28 NOTE — Progress Notes (Signed)
UR Chart Review Completed  

## 2012-11-28 NOTE — Progress Notes (Signed)
ANTIBIOTIC CONSULT NOTE  Pharmacy Consult for Vancomycin Indication: pneumonia  Allergies  Allergen Reactions  . Codeine   . Penicillins    Patient Measurements: Height: 5\' 3"  (160 cm) Weight: 113 lb 9.6 oz (51.529 kg) IBW/kg (Calculated) : 52.4   Vital Signs: Temp: 97.7 F (36.5 C) (01/14 0605) Temp src: Oral (01/14 0605) BP: 145/56 mmHg (01/14 0605) Pulse Rate: 85  (01/14 0605) Intake/Output from previous day: 01/13 0701 - 01/14 0700 In: 1412.8 [P.O.:480; I.V.:728.8; IV Piggyback:204] Out: -  Intake/Output from this shift:    Labs:  [facility] 11/28/12 0549 11/27/12 0618  WBC 10.3 12.7*  HGB 10.0* 9.5*  PLT 315 275  LABCREA -- --  CREATININE -- --   Estimated Creatinine Clearance: 39.1 ml/min (by C-G formula based on Cr of 0.87).  Basename 11/26/12 1844  VANCOTROUGH <5.0*  VANCOPEAK --  Drue Dun --  Janeece Agee --  GENTPEAK --  GENTRANDOM --  TOBRATROUGH --  TOBRAPEAK --  TOBRARND --  AMIKACINPEAK --  AMIKACINTROU --  AMIKACIN --    Microbiology: Recent Results (from the past 720 hour(s))  CULTURE, BLOOD (ROUTINE X 2)     Status: Normal   Collection Time   11/23/12  1:03 PM      Component Value Range Status Comment   Specimen Description BLOOD RIGHT ANTECUBITAL   Final    Special Requests     Final    Value: BOTTLES DRAWN AEROBIC AND ANAEROBIC AEB=10CC ANA=6CC   Culture NO GROWTH 5 DAYS   Final    Report Status 11/28/2012 FINAL   Final   CULTURE, BLOOD (ROUTINE X 2)     Status: Normal   Collection Time   11/23/12  1:08 PM      Component Value Range Status Comment   Specimen Description BLOOD LEFT ANTECUBITAL   Final    Special Requests     Final    Value: BOTTLES DRAWN AEROBIC AND ANAEROBIC AEB=12CC ANA=7CC   Culture NO GROWTH 5 DAYS   Final    Report Status 11/28/2012 FINAL   Final   CULTURE, EXPECTORATED SPUTUM-ASSESSMENT     Status: Normal   Collection Time   11/25/12 10:48 AM      Component Value Range Status Comment   Specimen Description  SPUTUM   Final    Special Requests Normal   Final    Sputum evaluation     Final    Value: THIS SPECIMEN IS ACCEPTABLE. RESPIRATORY CULTURE REPORT TO FOLLOW.   Report Status 11/25/2012 FINAL   Final   CULTURE, RESPIRATORY     Status: Normal   Collection Time   11/25/12 10:48 AM      Component Value Range Status Comment   Specimen Description SPUTUM   Final    Special Requests NONE   Final    Gram Stain     Final    Value: RARE WBC PRESENT, PREDOMINANTLY PMN     RARE SQUAMOUS EPITHELIAL CELLS PRESENT     FEW GRAM POSITIVE COCCI     IN PAIRS FEW GRAM POSITIVE COCCI     RARE YEAST   Culture NORMAL OROPHARYNGEAL FLORA   Final    Report Status 11/27/2012 FINAL   Final    Medical History: Past Medical History  Diagnosis Date  . Encounter for long-term (current) use of other medications   . Carotid artery stenosis     Without infarction  . Stroke   . Hypertension     Unspecified  . Coronary  artery disease   . Hypercholesterolemia     Pure  . Mixed hyperlipidemia   . LBBB (left bundle branch block)    Medications:  Scheduled:     . amLODipine  5 mg Oral Daily   And  . benazepril  20 mg Oral Daily  . aspirin EC  81 mg Oral Daily  . atorvastatin  40 mg Oral q1800  . beta carotene w/minerals  1 tablet Oral Daily  . donepezil  10 mg Oral Daily  . feeding supplement  237 mL Oral BID BM  . [COMPLETED] furosemide  40 mg Intravenous Once  . furosemide  20 mg Oral Daily  . levofloxacin (LEVAQUIN) IV  250 mg Intravenous Q24H  . metoprolol  12.5 mg Oral BID  . potassium chloride  20 mEq Oral BID  . pregabalin  75 mg Oral QODAY  . sodium chloride  3 mL Intravenous Q12H  . vancomycin  500 mg Intravenous Once  . vancomycin  1,000 mg Intravenous Q24H  . warfarin  2.5 mg Oral q1800  . [COMPLETED] warfarin  5 mg Oral ONCE-1800  . Warfarin - Pharmacist Dosing Inpatient   Does not apply q1800  . [DISCONTINUED] vancomycin  500 mg Intravenous Q12H   Assessment: 79 yo F on Vancomycin &  Levaquin for PNA.  She is clinically improving.  Cx data negative to date.  Her renal function has improved to baseline.   Vancomycin trough undetectable when last checked.  Goal of Therapy:  Vancomycin trough level 15-20 mcg/ml  Plan:  Increase Vancomycin to 1000mg  IV every 24 hours Recheck trough level Friday if needed Continue Levaquin 250mg  IV Q24h Monitor renal function and cx data  Duration of therapy per MD - recommend 8 days  Valrie Hart A 11/28/2012,1:43 PM

## 2012-11-28 NOTE — Progress Notes (Signed)
NUTRITION FOLLOW UP  Intervention:   Continue with current nutrition plan  Nutrition Dx:   Inadequate oral intake: improving  Goal:   Pt to meet >/= 90% of their estimated nutrition needs;  met  Monitor:   Meals and supplements, labs and wt trends  Assessment:   Pt po intake and hydration status improved. Recent meals 75-90%. She is also receiving Boost Plus BID.   Height: Ht Readings from Last 1 Encounters:  11/23/12 5\' 3"  (1.6 m)    Weight Status:   Wt Readings from Last 1 Encounters:  11/28/12 113 lb 9.6 oz (51.529 kg)    Re-estimated needs:  Kcal: 1400-1600 Protein: 60-70 gr Fluid: 1 ml/kcal  Skin: no issues noted  Diet Order: Cardiac   Intake/Output Summary (Last 24 hours) at 11/28/12 1032 Last data filed at 11/28/12 0600  Gross per 24 hour  Intake 1172.75 ml  Output      0 ml  Net 1172.75 ml    Last BM:    Labs:   Lab 11/25/12 0601 11/23/12 1312  NA 142 137  K 3.3* 4.1  CL 108 99  CO2 28 31  BUN 10 23  CREATININE 0.87 1.16*  CALCIUM 8.4 9.6  MG -- --  PHOS -- --  GLUCOSE 102* 145*    CBG (last 3)  No results found for this basename: GLUCAP:3 in the last 72 hours  Scheduled Meds:   . amLODipine  5 mg Oral Daily   And  . benazepril  20 mg Oral Daily  . aspirin EC  81 mg Oral Daily  . atorvastatin  40 mg Oral q1800  . beta carotene w/minerals  1 tablet Oral Daily  . donepezil  10 mg Oral Daily  . feeding supplement  237 mL Oral BID BM  . furosemide  20 mg Oral Daily  . levofloxacin (LEVAQUIN) IV  250 mg Intravenous Q24H  . metoprolol  12.5 mg Oral BID  . potassium chloride  20 mEq Oral BID  . pregabalin  75 mg Oral QODAY  . sodium chloride  3 mL Intravenous Q12H  . vancomycin  500 mg Intravenous Q12H  . Warfarin - Pharmacist Dosing Inpatient   Does not apply q1800    Continuous Infusions:   . sodium chloride 10 mL (11/27/12 1015)    #[id number]

## 2012-11-29 LAB — BASIC METABOLIC PANEL
Chloride: 104 mEq/L (ref 96–112)
GFR calc Af Amer: 67 mL/min — ABNORMAL LOW (ref >90)
Potassium: 3.4 mEq/L — ABNORMAL LOW (ref 3.5–5.1)
Sodium: 144 mEq/L (ref 135–145)

## 2012-11-29 LAB — PROTIME-INR
INR: 3.51 — ABNORMAL HIGH (ref 0.00–1.49)
Prothrombin Time: 33.2 seconds — ABNORMAL HIGH (ref 11.6–15.2)

## 2012-11-29 MED ORDER — POTASSIUM CHLORIDE CRYS ER 20 MEQ PO TBCR
40.0000 meq | EXTENDED_RELEASE_TABLET | Freq: Once | ORAL | Status: AC
  Administered 2012-11-29: 40 meq via ORAL
  Filled 2012-11-29: qty 2

## 2012-11-29 MED ORDER — OCUVITE-LUTEIN PO CAPS: 1.0000 | ORAL_CAPSULE | Freq: Every day | ORAL | Status: DC

## 2012-11-29 MED ORDER — DOXYCYCLINE HYCLATE 100 MG PO TABS: 100.0000 mg | ORAL_TABLET | Freq: Two times a day (BID) | ORAL | Status: DC

## 2012-11-29 MED ORDER — BENZONATATE 200 MG PO CAPS: 200.0000 mg | ORAL_CAPSULE | Freq: Three times a day (TID) | ORAL | Status: DC | PRN

## 2012-11-29 MED ORDER — WARFARIN SODIUM 1 MG PO TABS: 0.5000 mg | ORAL_TABLET | Freq: Once | ORAL | Status: DC

## 2012-11-29 MED ORDER — FUROSEMIDE 10 MG/ML IJ SOLN
40.0000 mg | Freq: Once | INTRAMUSCULAR | Status: AC
  Administered 2012-11-29: 40 mg via INTRAVENOUS
  Filled 2012-11-29: qty 4

## 2012-11-29 NOTE — Progress Notes (Signed)
ANTICOAGULATION CONSULT NOTE  Pharmacy Consult for Coumadin Indication: AVR  Allergies  Allergen Reactions  . Codeine   . Penicillins    Patient Measurements: Height: 5\' 3"  (160 cm) Weight: 109 lb 2 oz (49.5 kg) IBW/kg (Calculated) : 52.4   Vital Signs: Temp: 98.2 F (36.8 C) (01/15 0609) BP: 125/64 mmHg (01/15 0609) Pulse Rate: 82  (01/15 0609)  Labs:  Basename 11/29/12 0537 11/28/12 0549 11/27/12 0618  HGB -- 10.0* 9.5*  HCT -- 30.0* 28.8*  PLT -- 315 275  APTT -- -- --  LABPROT 33.2* 29.0* 25.7*  INR 3.51* 2.92* 2.48*  HEPARINUNFRC -- -- --  CREATININE 0.89 -- --  CKTOTAL -- -- --  CKMB -- -- --  TROPONINI -- -- --    Estimated Creatinine Clearance: 36.8 ml/min (by C-G formula based on Cr of 0.89).  Medical History: Past Medical History  Diagnosis Date  . Encounter for long-term (current) use of other medications   . Carotid artery stenosis     Without infarction  . Stroke   . Hypertension     Unspecified  . Coronary artery disease   . Hypercholesterolemia     Pure  . Mixed hyperlipidemia   . LBBB (left bundle branch block)    Medications:  Prescriptions prior to admission  Medication Sig Dispense Refill  . aspirin 81 MG EC tablet Take 81 mg by mouth daily.        Marland Kitchen donepezil (ARICEPT ODT) 10 MG disintegrating tablet Take 10 mg by mouth daily.        . ergocalciferol (VITAMIN D2) 50000 UNITS capsule Take 50,000 Units by mouth once a week.       . furosemide (LASIX) 20 MG tablet Take 1 tablet (20 mg total) by mouth daily.  30 tablet  3  . levofloxacin (LEVAQUIN) 500 MG tablet Take 500 mg by mouth daily. For 10 days (started on 11/22/12)      . LIPITOR 40 MG tablet TAKE ONE TABLET DAILY.  30 each  6  . LOTREL 5-20 MG per capsule TAKE ONE CAPSULE DAILY.  30 each  6  . Melatonin 3 MG TABS Take 3 mg by mouth at bedtime.        . metoprolol (LOPRESSOR) 50 MG tablet Take 0.5 tablets (25 mg total) by mouth 2 (two) times daily.  60 tablet  3  . Multiple  Vitamins-Minerals (OCUVITE EXTRA) TABS Take 1 tablet by mouth daily.        . potassium chloride (K-DUR,KLOR-CON) 10 MEQ tablet Take 1 tablet (10 mEq total) by mouth daily.  30 tablet  6  . pregabalin (LYRICA) 75 MG capsule Take 75 mg by mouth every other day. At bedtime       . warfarin (COUMADIN) 5 MG tablet Take 5 mg by mouth daily. Take 5 mg (1 tablet) on Monday, Wed, and Friday, then take 2.5 mg (1/2 tablet) all other days      . ZYMAXID 0.5 % SOLN Place 1 drop into the left eye as needed. Around every 6 weeks for 3 days after Avastin Shot       Assessment: 79yo female on chronic coumadin for hx AVR.  INR was supra-therapeutic on admission but is now therapeutic.Marland Kitchen  Coumadin was held x 2 days after admitted due to supra-therapeutic INR. No bleeding noted.  INR has trended to upper end of goal range.   Goal of Therapy:  INR 2.5-3.5   Plan: Coumadin 0.5mg  today x 1   (  1/2 mg) INR daily  [name] A 11/29/2012,11:37 AM

## 2012-11-29 NOTE — Plan of Care (Signed)
Problem: Discharge Progression Outcomes Goal: Dyspnea controlled Outcome: Adequate for Discharge O2 sat 93% on RA. Goal: Discharge plan in place and appropriate Outcome: Adequate for Discharge Pt d/c home with family, home health RN and PT

## 2012-11-29 NOTE — Discharge Summary (Signed)
Physician Discharge Summary  Patient ID: [name] MRN: [id number] DOB/AGE: 14-Feb-1926 79 y.o. Primary Care Physician:Jalani Rominger L, MD Admit date: 11/23/2012 Discharge date: 11/29/2012    Discharge Diagnoses:   Principal Problem:  *Pneumonia Active Problems:  HYPERTENSION, UNSPECIFIED  CAD, ARTERY BYPASS GRAFT  CAROTID ARTERY STENOSIS, WITHOUT INFARCTION  CVA  AORTIC VALVE REPLACEMENT, HX OF  Dehydration  Dementia  Mild malnutrition  Congestive heart failure  hypokalemia    Medication List     As of 11/29/2012  9:00 AM    ASK your doctor about these medications         aspirin 81 MG EC tablet   Take 81 mg by mouth daily.      donepezil 10 MG disintegrating tablet   Commonly known as: ARICEPT ODT   Take 10 mg by mouth daily.      ergocalciferol 50000 UNITS capsule   Commonly known as: VITAMIN D2   Take 50,000 Units by mouth once a week.      furosemide 20 MG tablet   Commonly known as: LASIX   Take 1 tablet (20 mg total) by mouth daily.      levofloxacin 500 MG tablet   Commonly known as: LEVAQUIN   Take 500 mg by mouth daily. For 10 days (started on 11/22/12)      LIPITOR 40 MG tablet   Generic drug: atorvastatin   TAKE ONE TABLET DAILY.      LOTREL 5-20 MG per capsule   Generic drug: amLODipine-benazepril   TAKE ONE CAPSULE DAILY.      Melatonin 3 MG Tabs   Take 3 mg by mouth at bedtime.      metoprolol 50 MG tablet   Commonly known as: LOPRESSOR   Take 0.5 tablets (25 mg total) by mouth 2 (two) times daily.      Ocuvite Extra Tabs   Take 1 tablet by mouth daily.      potassium chloride 10 MEQ tablet   Commonly known as: K-DUR,KLOR-CON   Take 1 tablet (10 mEq total) by mouth daily.      pregabalin 75 MG capsule   Commonly known as: LYRICA   Take 75 mg by mouth every other day. At bedtime      warfarin 5 MG tablet   Commonly known as: COUMADIN   Take 5 mg by mouth daily. Take 5 mg (1 tablet) on Monday, Wed, and Friday, then take 2.5  mg (1/2 tablet) all other days      ZYMAXID 0.5 % Soln   Generic drug: gatifloxacin   Place 1 drop into the left eye as needed. Around every 6 weeks for 3 days after Avastin Shot        Discharged Condition: Improved    Consults: None  Significant Diagnostic Studies: Dg Chest 2 View  11/23/2012  *RADIOLOGY REPORT*  Clinical Data: Cough, leukocytosis, shortness of breath.  CHEST - 2 VIEW  Comparison: 11/22/2012.  Findings: Trachea is midline.  Heart size stable.  Thoracic aorta is calcified.  Biapical pleural thickening.  Lungs are hyperinflated with scattered perihilar scarring.  No pleural fluid.  IMPRESSION: COPD without acute finding.   Original Report Authenticated By: Leanna Battles, M.D.    Dg Chest 2 View  11/22/2012  *RADIOLOGY REPORT*  Clinical Data: Cough, congestion, shortness of breath  CHEST - 2 VIEW  Comparison: Chest x-ray of 01/29/2008  Findings: The lungs are clear and slightly hyperaerated. Cardiomegaly is stable.  An aortic valve prosthesis is noted.  Considerable calcification of the mitral annulus is present. Median sternotomy sutures are noted from CABG as well.  The bones are osteopenic and there is a slight thoracic kyphosis present with degenerative change.  IMPRESSION: COPD.  No active lung disease.  Stable mild cardiomegaly.   Original Report Authenticated By: Dwyane Dee, M.D.     Lab Results: Basic Metabolic Panel:  Basename 11/29/12 0537  NA 144  K 3.4*  CL 104  CO2 37*  GLUCOSE 86  BUN 12  CREATININE 0.89  CALCIUM 8.8  MG --  PHOS --   Liver Function Tests: No results found for this basename: AST:2,ALT:2,ALKPHOS:2,BILITOT:2,PROT:2,ALBUMIN:2 in the last 72 hours   CBC:  Basename 11/28/12 0549 11/27/12 0618  WBC 10.3 12.7*  NEUTROABS 7.4 10.1*  HGB 10.0* 9.5*  HCT 30.0* 28.8*  MCV 92.6 93.2  PLT 315 275    Recent Results (from the past 240 hour(s))  CULTURE, BLOOD (ROUTINE X 2)     Status: Normal   Collection Time   11/23/12  1:03 PM       Component Value Range Status Comment   Specimen Description BLOOD RIGHT ANTECUBITAL   Final    Special Requests     Final    Value: BOTTLES DRAWN AEROBIC AND ANAEROBIC AEB=10CC ANA=6CC   Culture NO GROWTH 5 DAYS   Final    Report Status 11/28/2012 FINAL   Final   CULTURE, BLOOD (ROUTINE X 2)     Status: Normal   Collection Time   11/23/12  1:08 PM      Component Value Range Status Comment   Specimen Description BLOOD LEFT ANTECUBITAL   Final    Special Requests     Final    Value: BOTTLES DRAWN AEROBIC AND ANAEROBIC AEB=12CC ANA=7CC   Culture NO GROWTH 5 DAYS   Final    Report Status 11/28/2012 FINAL   Final   CULTURE, EXPECTORATED SPUTUM-ASSESSMENT     Status: Normal   Collection Time   11/25/12 10:48 AM      Component Value Range Status Comment   Specimen Description SPUTUM   Final    Special Requests Normal   Final    Sputum evaluation     Final    Value: THIS SPECIMEN IS ACCEPTABLE. RESPIRATORY CULTURE REPORT TO FOLLOW.   Report Status 11/25/2012 FINAL   Final   CULTURE, RESPIRATORY     Status: Normal   Collection Time   11/25/12 10:48 AM      Component Value Range Status Comment   Specimen Description SPUTUM   Final    Special Requests NONE   Final    Gram Stain     Final    Value: RARE WBC PRESENT, PREDOMINANTLY PMN     RARE SQUAMOUS EPITHELIAL CELLS PRESENT     FEW GRAM POSITIVE COCCI     IN PAIRS FEW GRAM POSITIVE COCCI     RARE YEAST   Culture NORMAL OROPHARYNGEAL FLORA   Final    Report Status 11/27/2012 FINAL   Final      Hospital Course: She was admitted after failing outpatient treatment for what was clinically pneumonia. Her white blood count was greater than 30,000. She appear to be dehydrated. She was started on IV antibiotics and IV fluids. She had some evidence of volume overload after about 3 days of IV and was given Lasix. She was mildly hypokalemic and this was replaced. She improved markedly over the next several days to the point that she  was able to  ambulate and her cough was much better  Discharge Exam: Blood pressure 125/64, pulse 82, temperature 98.2 F (36.8 C), temperature source Oral, resp. rate 18, height 5\' 3"  (1.6 m), weight 49.5 kg (109 lb 2 oz), SpO2 99.00%. She is awake and alert. She has prosthetic heart valve sounds. Her heart is regular. Her abdomen is soft  Disposition: Home with home health      Discharge Orders    Future Appointments: Provider: Department: Dept Phone: Center:   12/05/2012 10:15 AM [facility] [phone] None   12/19/2012 3:45 PM Gaylord Shih, MD [facility] [place]) [phone] [id number]        Signed: Fredirick Maudlin Pager [phone]  11/29/2012, 9:00 AM

## 2012-11-30 ENCOUNTER — Ambulatory Visit: Payer: Self-pay | Admitting: Cardiology

## 2012-11-30 DIAGNOSIS — I2510 Atherosclerotic heart disease of native coronary artery without angina pectoris: Secondary | ICD-10-CM | POA: Diagnosis not present

## 2012-11-30 DIAGNOSIS — J189 Pneumonia, unspecified organism: Secondary | ICD-10-CM | POA: Diagnosis not present

## 2012-11-30 DIAGNOSIS — I6350 Cerebral infarction due to unspecified occlusion or stenosis of unspecified cerebral artery: Secondary | ICD-10-CM

## 2012-11-30 DIAGNOSIS — I10 Essential (primary) hypertension: Secondary | ICD-10-CM | POA: Diagnosis not present

## 2012-11-30 DIAGNOSIS — I509 Heart failure, unspecified: Secondary | ICD-10-CM | POA: Diagnosis not present

## 2012-11-30 DIAGNOSIS — I359 Nonrheumatic aortic valve disorder, unspecified: Secondary | ICD-10-CM

## 2012-11-30 DIAGNOSIS — F0390 Unspecified dementia without behavioral disturbance: Secondary | ICD-10-CM | POA: Diagnosis not present

## 2012-11-30 DIAGNOSIS — Z954 Presence of other heart-valve replacement: Secondary | ICD-10-CM

## 2012-11-30 DIAGNOSIS — R269 Unspecified abnormalities of gait and mobility: Secondary | ICD-10-CM | POA: Diagnosis not present

## 2012-11-30 LAB — POCT INR: INR: 5.9

## 2012-12-01 ENCOUNTER — Encounter: Payer: Self-pay | Admitting: Cardiology

## 2012-12-01 DIAGNOSIS — F0390 Unspecified dementia without behavioral disturbance: Secondary | ICD-10-CM | POA: Diagnosis not present

## 2012-12-01 DIAGNOSIS — I10 Essential (primary) hypertension: Secondary | ICD-10-CM | POA: Diagnosis not present

## 2012-12-01 DIAGNOSIS — I509 Heart failure, unspecified: Secondary | ICD-10-CM | POA: Diagnosis not present

## 2012-12-01 DIAGNOSIS — J189 Pneumonia, unspecified organism: Secondary | ICD-10-CM | POA: Diagnosis not present

## 2012-12-01 DIAGNOSIS — R269 Unspecified abnormalities of gait and mobility: Secondary | ICD-10-CM | POA: Diagnosis not present

## 2012-12-01 DIAGNOSIS — I2510 Atherosclerotic heart disease of native coronary artery without angina pectoris: Secondary | ICD-10-CM | POA: Diagnosis not present

## 2012-12-04 DIAGNOSIS — I2510 Atherosclerotic heart disease of native coronary artery without angina pectoris: Secondary | ICD-10-CM | POA: Diagnosis not present

## 2012-12-04 DIAGNOSIS — J189 Pneumonia, unspecified organism: Secondary | ICD-10-CM | POA: Diagnosis not present

## 2012-12-04 DIAGNOSIS — R269 Unspecified abnormalities of gait and mobility: Secondary | ICD-10-CM | POA: Diagnosis not present

## 2012-12-04 DIAGNOSIS — I10 Essential (primary) hypertension: Secondary | ICD-10-CM | POA: Diagnosis not present

## 2012-12-04 DIAGNOSIS — F0390 Unspecified dementia without behavioral disturbance: Secondary | ICD-10-CM | POA: Diagnosis not present

## 2012-12-04 DIAGNOSIS — I509 Heart failure, unspecified: Secondary | ICD-10-CM | POA: Diagnosis not present

## 2012-12-05 ENCOUNTER — Ambulatory Visit: Payer: Self-pay | Admitting: Cardiovascular Disease

## 2012-12-05 DIAGNOSIS — I6350 Cerebral infarction due to unspecified occlusion or stenosis of unspecified cerebral artery: Secondary | ICD-10-CM

## 2012-12-05 DIAGNOSIS — F0390 Unspecified dementia without behavioral disturbance: Secondary | ICD-10-CM | POA: Diagnosis not present

## 2012-12-05 DIAGNOSIS — I10 Essential (primary) hypertension: Secondary | ICD-10-CM | POA: Diagnosis not present

## 2012-12-05 DIAGNOSIS — Z954 Presence of other heart-valve replacement: Secondary | ICD-10-CM

## 2012-12-05 DIAGNOSIS — I2510 Atherosclerotic heart disease of native coronary artery without angina pectoris: Secondary | ICD-10-CM | POA: Diagnosis not present

## 2012-12-05 DIAGNOSIS — I509 Heart failure, unspecified: Secondary | ICD-10-CM | POA: Diagnosis not present

## 2012-12-05 DIAGNOSIS — I359 Nonrheumatic aortic valve disorder, unspecified: Secondary | ICD-10-CM

## 2012-12-05 DIAGNOSIS — R269 Unspecified abnormalities of gait and mobility: Secondary | ICD-10-CM | POA: Diagnosis not present

## 2012-12-05 DIAGNOSIS — R0602 Shortness of breath: Secondary | ICD-10-CM | POA: Diagnosis not present

## 2012-12-05 DIAGNOSIS — J189 Pneumonia, unspecified organism: Secondary | ICD-10-CM | POA: Diagnosis not present

## 2012-12-06 DIAGNOSIS — F0390 Unspecified dementia without behavioral disturbance: Secondary | ICD-10-CM | POA: Diagnosis not present

## 2012-12-06 DIAGNOSIS — I509 Heart failure, unspecified: Secondary | ICD-10-CM | POA: Diagnosis not present

## 2012-12-06 DIAGNOSIS — I10 Essential (primary) hypertension: Secondary | ICD-10-CM | POA: Diagnosis not present

## 2012-12-06 DIAGNOSIS — J189 Pneumonia, unspecified organism: Secondary | ICD-10-CM | POA: Diagnosis not present

## 2012-12-06 DIAGNOSIS — I2510 Atherosclerotic heart disease of native coronary artery without angina pectoris: Secondary | ICD-10-CM | POA: Diagnosis not present

## 2012-12-06 DIAGNOSIS — R269 Unspecified abnormalities of gait and mobility: Secondary | ICD-10-CM | POA: Diagnosis not present

## 2012-12-07 DIAGNOSIS — H35059 Retinal neovascularization, unspecified, unspecified eye: Secondary | ICD-10-CM | POA: Diagnosis not present

## 2012-12-07 DIAGNOSIS — H353290 Exudative age-related macular degeneration, unspecified eye, stage unspecified: Secondary | ICD-10-CM | POA: Diagnosis not present

## 2012-12-08 DIAGNOSIS — I2510 Atherosclerotic heart disease of native coronary artery without angina pectoris: Secondary | ICD-10-CM | POA: Diagnosis not present

## 2012-12-08 DIAGNOSIS — I10 Essential (primary) hypertension: Secondary | ICD-10-CM | POA: Diagnosis not present

## 2012-12-08 DIAGNOSIS — J189 Pneumonia, unspecified organism: Secondary | ICD-10-CM | POA: Diagnosis not present

## 2012-12-08 DIAGNOSIS — F0390 Unspecified dementia without behavioral disturbance: Secondary | ICD-10-CM | POA: Diagnosis not present

## 2012-12-08 DIAGNOSIS — R269 Unspecified abnormalities of gait and mobility: Secondary | ICD-10-CM | POA: Diagnosis not present

## 2012-12-08 DIAGNOSIS — I509 Heart failure, unspecified: Secondary | ICD-10-CM | POA: Diagnosis not present

## 2012-12-11 DIAGNOSIS — J189 Pneumonia, unspecified organism: Secondary | ICD-10-CM | POA: Diagnosis not present

## 2012-12-11 DIAGNOSIS — I2510 Atherosclerotic heart disease of native coronary artery without angina pectoris: Secondary | ICD-10-CM | POA: Diagnosis not present

## 2012-12-11 DIAGNOSIS — I10 Essential (primary) hypertension: Secondary | ICD-10-CM | POA: Diagnosis not present

## 2012-12-11 DIAGNOSIS — F0390 Unspecified dementia without behavioral disturbance: Secondary | ICD-10-CM | POA: Diagnosis not present

## 2012-12-11 DIAGNOSIS — R269 Unspecified abnormalities of gait and mobility: Secondary | ICD-10-CM | POA: Diagnosis not present

## 2012-12-11 DIAGNOSIS — I509 Heart failure, unspecified: Secondary | ICD-10-CM | POA: Diagnosis not present

## 2012-12-12 ENCOUNTER — Ambulatory Visit: Payer: Self-pay | Admitting: Cardiology

## 2012-12-12 DIAGNOSIS — I509 Heart failure, unspecified: Secondary | ICD-10-CM | POA: Diagnosis not present

## 2012-12-12 DIAGNOSIS — R269 Unspecified abnormalities of gait and mobility: Secondary | ICD-10-CM | POA: Diagnosis not present

## 2012-12-12 DIAGNOSIS — I10 Essential (primary) hypertension: Secondary | ICD-10-CM | POA: Diagnosis not present

## 2012-12-12 DIAGNOSIS — F0390 Unspecified dementia without behavioral disturbance: Secondary | ICD-10-CM | POA: Diagnosis not present

## 2012-12-12 DIAGNOSIS — J189 Pneumonia, unspecified organism: Secondary | ICD-10-CM | POA: Diagnosis not present

## 2012-12-12 DIAGNOSIS — I359 Nonrheumatic aortic valve disorder, unspecified: Secondary | ICD-10-CM

## 2012-12-12 DIAGNOSIS — I6350 Cerebral infarction due to unspecified occlusion or stenosis of unspecified cerebral artery: Secondary | ICD-10-CM

## 2012-12-12 DIAGNOSIS — I2510 Atherosclerotic heart disease of native coronary artery without angina pectoris: Secondary | ICD-10-CM | POA: Diagnosis not present

## 2012-12-12 DIAGNOSIS — Z954 Presence of other heart-valve replacement: Secondary | ICD-10-CM

## 2012-12-12 LAB — POCT INR: INR: 3.8

## 2012-12-13 DIAGNOSIS — E785 Hyperlipidemia, unspecified: Secondary | ICD-10-CM | POA: Diagnosis not present

## 2012-12-13 DIAGNOSIS — I509 Heart failure, unspecified: Secondary | ICD-10-CM | POA: Diagnosis not present

## 2012-12-13 DIAGNOSIS — I10 Essential (primary) hypertension: Secondary | ICD-10-CM | POA: Diagnosis not present

## 2012-12-13 DIAGNOSIS — J449 Chronic obstructive pulmonary disease, unspecified: Secondary | ICD-10-CM | POA: Diagnosis not present

## 2012-12-14 DIAGNOSIS — R269 Unspecified abnormalities of gait and mobility: Secondary | ICD-10-CM | POA: Diagnosis not present

## 2012-12-14 DIAGNOSIS — I10 Essential (primary) hypertension: Secondary | ICD-10-CM | POA: Diagnosis not present

## 2012-12-14 DIAGNOSIS — I2510 Atherosclerotic heart disease of native coronary artery without angina pectoris: Secondary | ICD-10-CM | POA: Diagnosis not present

## 2012-12-14 DIAGNOSIS — F0390 Unspecified dementia without behavioral disturbance: Secondary | ICD-10-CM | POA: Diagnosis not present

## 2012-12-14 DIAGNOSIS — J189 Pneumonia, unspecified organism: Secondary | ICD-10-CM | POA: Diagnosis not present

## 2012-12-14 DIAGNOSIS — I509 Heart failure, unspecified: Secondary | ICD-10-CM | POA: Diagnosis not present

## 2012-12-19 ENCOUNTER — Ambulatory Visit: Payer: Self-pay | Admitting: Cardiology

## 2012-12-19 ENCOUNTER — Ambulatory Visit: Payer: Medicare Other | Admitting: Cardiology

## 2012-12-19 DIAGNOSIS — Z954 Presence of other heart-valve replacement: Secondary | ICD-10-CM

## 2012-12-19 DIAGNOSIS — I509 Heart failure, unspecified: Secondary | ICD-10-CM | POA: Diagnosis not present

## 2012-12-19 DIAGNOSIS — I6350 Cerebral infarction due to unspecified occlusion or stenosis of unspecified cerebral artery: Secondary | ICD-10-CM

## 2012-12-19 DIAGNOSIS — R269 Unspecified abnormalities of gait and mobility: Secondary | ICD-10-CM | POA: Diagnosis not present

## 2012-12-19 DIAGNOSIS — J189 Pneumonia, unspecified organism: Secondary | ICD-10-CM | POA: Diagnosis not present

## 2012-12-19 DIAGNOSIS — F0390 Unspecified dementia without behavioral disturbance: Secondary | ICD-10-CM | POA: Diagnosis not present

## 2012-12-19 DIAGNOSIS — I359 Nonrheumatic aortic valve disorder, unspecified: Secondary | ICD-10-CM

## 2012-12-19 DIAGNOSIS — I2510 Atherosclerotic heart disease of native coronary artery without angina pectoris: Secondary | ICD-10-CM | POA: Diagnosis not present

## 2012-12-19 DIAGNOSIS — I10 Essential (primary) hypertension: Secondary | ICD-10-CM | POA: Diagnosis not present

## 2012-12-19 LAB — POCT INR: INR: 3.2

## 2012-12-26 ENCOUNTER — Ambulatory Visit: Payer: Self-pay | Admitting: Cardiology

## 2012-12-26 DIAGNOSIS — I509 Heart failure, unspecified: Secondary | ICD-10-CM | POA: Diagnosis not present

## 2012-12-26 DIAGNOSIS — F0390 Unspecified dementia without behavioral disturbance: Secondary | ICD-10-CM | POA: Diagnosis not present

## 2012-12-26 DIAGNOSIS — I359 Nonrheumatic aortic valve disorder, unspecified: Secondary | ICD-10-CM

## 2012-12-26 DIAGNOSIS — I10 Essential (primary) hypertension: Secondary | ICD-10-CM | POA: Diagnosis not present

## 2012-12-26 DIAGNOSIS — R269 Unspecified abnormalities of gait and mobility: Secondary | ICD-10-CM | POA: Diagnosis not present

## 2012-12-26 DIAGNOSIS — J189 Pneumonia, unspecified organism: Secondary | ICD-10-CM | POA: Diagnosis not present

## 2012-12-26 DIAGNOSIS — I2510 Atherosclerotic heart disease of native coronary artery without angina pectoris: Secondary | ICD-10-CM | POA: Diagnosis not present

## 2012-12-26 DIAGNOSIS — I6350 Cerebral infarction due to unspecified occlusion or stenosis of unspecified cerebral artery: Secondary | ICD-10-CM

## 2012-12-26 DIAGNOSIS — Z954 Presence of other heart-valve replacement: Secondary | ICD-10-CM

## 2013-01-02 ENCOUNTER — Ambulatory Visit: Payer: Self-pay | Admitting: Cardiology

## 2013-01-02 ENCOUNTER — Telehealth: Payer: Self-pay | Admitting: Cardiology

## 2013-01-02 DIAGNOSIS — I509 Heart failure, unspecified: Secondary | ICD-10-CM | POA: Diagnosis not present

## 2013-01-02 DIAGNOSIS — Z961 Presence of intraocular lens: Secondary | ICD-10-CM | POA: Diagnosis not present

## 2013-01-02 DIAGNOSIS — I359 Nonrheumatic aortic valve disorder, unspecified: Secondary | ICD-10-CM

## 2013-01-02 DIAGNOSIS — Z954 Presence of other heart-valve replacement: Secondary | ICD-10-CM

## 2013-01-02 DIAGNOSIS — F0390 Unspecified dementia without behavioral disturbance: Secondary | ICD-10-CM | POA: Diagnosis not present

## 2013-01-02 DIAGNOSIS — I10 Essential (primary) hypertension: Secondary | ICD-10-CM | POA: Diagnosis not present

## 2013-01-02 DIAGNOSIS — I2510 Atherosclerotic heart disease of native coronary artery without angina pectoris: Secondary | ICD-10-CM | POA: Diagnosis not present

## 2013-01-02 DIAGNOSIS — I6350 Cerebral infarction due to unspecified occlusion or stenosis of unspecified cerebral artery: Secondary | ICD-10-CM

## 2013-01-02 DIAGNOSIS — J189 Pneumonia, unspecified organism: Secondary | ICD-10-CM | POA: Diagnosis not present

## 2013-01-02 DIAGNOSIS — R269 Unspecified abnormalities of gait and mobility: Secondary | ICD-10-CM | POA: Diagnosis not present

## 2013-01-02 DIAGNOSIS — H348392 Tributary (branch) retinal vein occlusion, unspecified eye, stable: Secondary | ICD-10-CM | POA: Diagnosis not present

## 2013-01-02 NOTE — Telephone Encounter (Signed)
[name] WITH [facility] IS CALLING IN INR RESULTS. INR 2.4   SHE STATES SHE HAS BEEN CALLING [facility] FOR A LONG TIME AND NO ONE IS ANSWERING, SORRY IF I SENT IT TO WRONG PERSON. THANKS TANYA @ [place].

## 2013-01-02 NOTE — Telephone Encounter (Signed)
See Anticoagulation encounter.

## 2013-01-09 ENCOUNTER — Ambulatory Visit: Payer: Self-pay | Admitting: Cardiology

## 2013-01-09 DIAGNOSIS — Z954 Presence of other heart-valve replacement: Secondary | ICD-10-CM

## 2013-01-09 DIAGNOSIS — I10 Essential (primary) hypertension: Secondary | ICD-10-CM | POA: Diagnosis not present

## 2013-01-09 DIAGNOSIS — J189 Pneumonia, unspecified organism: Secondary | ICD-10-CM | POA: Diagnosis not present

## 2013-01-09 DIAGNOSIS — I509 Heart failure, unspecified: Secondary | ICD-10-CM | POA: Diagnosis not present

## 2013-01-09 DIAGNOSIS — F0390 Unspecified dementia without behavioral disturbance: Secondary | ICD-10-CM | POA: Diagnosis not present

## 2013-01-09 DIAGNOSIS — I6350 Cerebral infarction due to unspecified occlusion or stenosis of unspecified cerebral artery: Secondary | ICD-10-CM

## 2013-01-09 DIAGNOSIS — I2510 Atherosclerotic heart disease of native coronary artery without angina pectoris: Secondary | ICD-10-CM | POA: Diagnosis not present

## 2013-01-09 DIAGNOSIS — I359 Nonrheumatic aortic valve disorder, unspecified: Secondary | ICD-10-CM

## 2013-01-09 DIAGNOSIS — R269 Unspecified abnormalities of gait and mobility: Secondary | ICD-10-CM | POA: Diagnosis not present

## 2013-01-10 DIAGNOSIS — E785 Hyperlipidemia, unspecified: Secondary | ICD-10-CM | POA: Diagnosis not present

## 2013-01-10 DIAGNOSIS — I10 Essential (primary) hypertension: Secondary | ICD-10-CM | POA: Diagnosis not present

## 2013-01-10 DIAGNOSIS — J449 Chronic obstructive pulmonary disease, unspecified: Secondary | ICD-10-CM | POA: Diagnosis not present

## 2013-01-10 DIAGNOSIS — I509 Heart failure, unspecified: Secondary | ICD-10-CM | POA: Diagnosis not present

## 2013-01-17 DIAGNOSIS — H35059 Retinal neovascularization, unspecified, unspecified eye: Secondary | ICD-10-CM | POA: Diagnosis not present

## 2013-01-17 DIAGNOSIS — H353290 Exudative age-related macular degeneration, unspecified eye, stage unspecified: Secondary | ICD-10-CM | POA: Diagnosis not present

## 2013-01-20 ENCOUNTER — Other Ambulatory Visit: Payer: Self-pay | Admitting: Cardiology

## 2013-01-23 ENCOUNTER — Ambulatory Visit (INDEPENDENT_AMBULATORY_CARE_PROVIDER_SITE_OTHER): Payer: Medicare Other | Admitting: Pharmacist

## 2013-01-23 DIAGNOSIS — I6350 Cerebral infarction due to unspecified occlusion or stenosis of unspecified cerebral artery: Secondary | ICD-10-CM | POA: Diagnosis not present

## 2013-01-23 DIAGNOSIS — I359 Nonrheumatic aortic valve disorder, unspecified: Secondary | ICD-10-CM

## 2013-01-23 DIAGNOSIS — Z954 Presence of other heart-valve replacement: Secondary | ICD-10-CM | POA: Diagnosis not present

## 2013-01-23 LAB — POCT INR: INR: 1.8

## 2013-02-01 ENCOUNTER — Telehealth: Payer: Self-pay | Admitting: Cardiology

## 2013-02-01 NOTE — Telephone Encounter (Signed)
Fax received today. Will complete and refax back. Mylo Red RN

## 2013-02-01 NOTE — Telephone Encounter (Signed)
New problem    Fax request sent on  2/11. Will re-fax form over .

## 2013-02-08 DIAGNOSIS — J449 Chronic obstructive pulmonary disease, unspecified: Secondary | ICD-10-CM | POA: Diagnosis not present

## 2013-02-08 DIAGNOSIS — I10 Essential (primary) hypertension: Secondary | ICD-10-CM | POA: Diagnosis not present

## 2013-02-08 DIAGNOSIS — E785 Hyperlipidemia, unspecified: Secondary | ICD-10-CM | POA: Diagnosis not present

## 2013-02-08 DIAGNOSIS — I259 Chronic ischemic heart disease, unspecified: Secondary | ICD-10-CM | POA: Diagnosis not present

## 2013-02-12 ENCOUNTER — Ambulatory Visit (INDEPENDENT_AMBULATORY_CARE_PROVIDER_SITE_OTHER): Payer: Medicare Other | Admitting: Pharmacist

## 2013-02-12 ENCOUNTER — Ambulatory Visit (INDEPENDENT_AMBULATORY_CARE_PROVIDER_SITE_OTHER): Payer: Medicare Other | Admitting: Cardiology

## 2013-02-12 ENCOUNTER — Encounter: Payer: Self-pay | Admitting: Cardiology

## 2013-02-12 VITALS — BP 144/62 | HR 78 | Ht 63.0 in | Wt 115.0 lb

## 2013-02-12 DIAGNOSIS — I359 Nonrheumatic aortic valve disorder, unspecified: Secondary | ICD-10-CM

## 2013-02-12 DIAGNOSIS — I10 Essential (primary) hypertension: Secondary | ICD-10-CM

## 2013-02-12 DIAGNOSIS — I25810 Atherosclerosis of coronary artery bypass graft(s) without angina pectoris: Secondary | ICD-10-CM

## 2013-02-12 DIAGNOSIS — I447 Left bundle-branch block, unspecified: Secondary | ICD-10-CM | POA: Diagnosis not present

## 2013-02-12 DIAGNOSIS — I6350 Cerebral infarction due to unspecified occlusion or stenosis of unspecified cerebral artery: Secondary | ICD-10-CM | POA: Diagnosis not present

## 2013-02-12 DIAGNOSIS — Z954 Presence of other heart-valve replacement: Secondary | ICD-10-CM

## 2013-02-12 DIAGNOSIS — E785 Hyperlipidemia, unspecified: Secondary | ICD-10-CM

## 2013-02-12 DIAGNOSIS — I6529 Occlusion and stenosis of unspecified carotid artery: Secondary | ICD-10-CM

## 2013-02-12 NOTE — Patient Instructions (Addendum)
Your physician has requested that you have a carotid duplex. This test is an ultrasound of the carotid arteries in your neck. It looks at blood flow through these arteries that supply the brain with blood. Allow one hour for this exam. There are no restrictions or special instructions.  Your physician wants you to follow-up in: September. You will receive a reminder letter in the mail two months in advance. If you don't receive a letter, please call our office to schedule the follow-up appointment.

## 2013-02-12 NOTE — Assessment & Plan Note (Signed)
Stable. Continue secondary preventative therapy. 

## 2013-02-12 NOTE — Assessment & Plan Note (Signed)
We'll arrange carotid Dopplers in July. We'll schedule followup with Dr. Jens Som as her new cardiologist in September.

## 2013-02-12 NOTE — Assessment & Plan Note (Addendum)
She is asymptomatic with severe aortic insufficiency. We'll continue to handle this medically as discussed on previous visits with her and her daughter. We'll switch to warfarin and have arranged close followup with the Coumadin clinic.

## 2013-02-12 NOTE — Progress Notes (Signed)
HPI [name] returns today for evaluation and management of her nonobstructive carotid artery disease, history of global amnesia, history of hypertension, history of coronary artery disease, history of aortic valve replacement now with asymptomatic severe aortic regurgitation, left bundle branch block, anticoagulation.  She had pneumonia and was hospitalized in [place] in January. She is still wearing home O2 at night. Her weight is back up 5 pounds. Her daughter [name] is with her and says she doing much better. She is still driving and going to the fitness Center.  She denies any orthopnea, PND, edema, or increased dyspnea on exertion. She's had no angina.  Her biggest complaint is her meds Coumadin cost. She would like to switch to warfarin.  Past Medical History  Diagnosis Date  . Encounter for long-term (current) use of other medications   . Carotid artery stenosis     Without infarction  . Stroke   . Hypertension     Unspecified  . Coronary artery disease   . Hypercholesterolemia     Pure  . Mixed hyperlipidemia   . LBBB (left bundle branch block)     Current Outpatient Prescriptions  Medication Sig Dispense Refill  . aspirin 81 MG EC tablet Take 81 mg by mouth daily.        Marland Kitchen atorvastatin (LIPITOR) 40 MG tablet TAKE ONE TABLET DAILY.  30 tablet  5  . donepezil (ARICEPT ODT) 10 MG disintegrating tablet Take 10 mg by mouth daily.        . ergocalciferol (VITAMIN D2) 50000 UNITS capsule Take 50,000 Units by mouth once a week.       . furosemide (LASIX) 20 MG tablet Take 1 tablet (20 mg total) by mouth daily.  30 tablet  3  . LOTREL 5-20 MG per capsule TAKE ONE CAPSULE DAILY.  30 each  6  . Melatonin 3 MG TABS Take 3 mg by mouth at bedtime.        . metoprolol (LOPRESSOR) 50 MG tablet Take 0.5 tablets (25 mg total) by mouth 2 (two) times daily.  60 tablet  3  . Multiple Vitamins-Minerals (VITEYES AREDS FORMULA PO) Take 1 tablet by mouth daily.      . potassium chloride  (K-DUR,KLOR-CON) 10 MEQ tablet Take 1 tablet (10 mEq total) by mouth daily.  30 tablet  6  . pregabalin (LYRICA) 75 MG capsule Take 75 mg by mouth every other day. At bedtime       . traZODone (DESYREL) 50 MG tablet Take 1 tablet by mouth at bedtime as needed.      . warfarin (COUMADIN) 5 MG tablet Take 5 mg by mouth daily. Take 5 mg (1 tablet) on Monday, Wed, and Friday, then take 2.5 mg (1/2 tablet) all other days      . ZYMAXID 0.5 % SOLN Place 1 drop into the left eye as needed. Around every 6 weeks for 3 days after Avastin Shot       No current facility-administered medications for this visit.    Allergies  Allergen Reactions  . Codeine   . Penicillins     Family History  Problem Relation Age of Onset  . Heart attack Father   . Stroke Sister     History   Social History  . Marital Status: Widowed    Spouse Name: N/A    Number of Children: N/A  . Years of Education: N/A   Occupational History  . Not on file.   Social History Main Topics  .  Smoking status: Former Smoker    Quit date: 11/30/1991  . Smokeless tobacco: Never Used     Comment: Tobacco use-no  . Alcohol Use: No  . Drug Use: Not on file  . Sexually Active: No   Other Topics Concern  . Not on file   Social History Narrative  . No narrative on file    ROS ALL NEGATIVE EXCEPT THOSE NOTED IN HPI  PE  General Appearance: well developed, well nourished in no acute distress HEENT: symmetrical face, PERRLA, good dentition  Neck: no JVD, thyromegaly, or adenopathy, trachea midline Chest: symmetric without deformity Cardiac: PMI non-displaced, RRR, normal S1, S2, no gallop, 2/6 aortic insufficiency murmur Lung: clear to ausculation and percussion Vascular: all pulses full without bruits  Abdominal: nondistended, nontender, good bowel sounds, no HSM, no bruits Extremities: no cyanosis, clubbing or edema, no sign of DVT, no varicosities  Skin: normal color, no rashes Neuro: alert and oriented x 3,  non-focal Pysch: normal affect  EKG   BMET    Component Value Date/Time   NA 144 11/29/2012 0537   K 3.4* 11/29/2012 0537   CL 104 11/29/2012 0537   CO2 37* 11/29/2012 0537   GLUCOSE 86 11/29/2012 0537   BUN 12 11/29/2012 0537   CREATININE 0.89 11/29/2012 0537   CALCIUM 8.8 11/29/2012 0537   GFRNONAA 58* 11/29/2012 0537   GFRAA 67* 11/29/2012 0537    Lipid Panel     Component Value Date/Time   CHOL 146 06/16/2010 0936   TRIG 61.0 06/16/2010 0936   HDL 54.20 06/16/2010 0936   CHOLHDL 3 06/16/2010 0936   VLDL 12.2 06/16/2010 0936   LDLCALC 80 06/16/2010 0936    CBC    Component Value Date/Time   WBC 10.3 11/28/2012 0549   RBC 3.24* 11/28/2012 0549   HGB 10.0* 11/28/2012 0549   HCT 30.0* 11/28/2012 0549   PLT 315 11/28/2012 0549   MCV 92.6 11/28/2012 0549   MCH 30.9 11/28/2012 0549   MCHC 33.3 11/28/2012 0549   RDW 14.0 11/28/2012 0549   LYMPHSABS 1.6 11/28/2012 0549   MONOABS 1.2* 11/28/2012 0549   EOSABS 0.1 11/28/2012 0549   BASOSABS 0.0 11/28/2012 0549

## 2013-02-13 ENCOUNTER — Telehealth: Payer: Self-pay | Admitting: Cardiology

## 2013-02-13 NOTE — Telephone Encounter (Signed)
Spoke with pts daughter.  Pt has approximately 2 weeks left of brand name Coumadin.  Will finish that prescription prior to changing to generic.  Will keep current appt on 4/29 to recheck INR.    Spoke with Algernon Huxley, pharmacist with [facility].  They are going to change the prescription to generic.

## 2013-02-13 NOTE — Telephone Encounter (Signed)
New Problem:    Called in needing clarification about the patient's change in their coumadin medication.  Patient has approximately 2 weeks of her current coumadin medication left that she will continue to take.  Please call back.

## 2013-02-19 ENCOUNTER — Other Ambulatory Visit: Payer: Self-pay | Admitting: Cardiology

## 2013-02-26 ENCOUNTER — Other Ambulatory Visit: Payer: Self-pay | Admitting: Cardiology

## 2013-02-28 DIAGNOSIS — H35059 Retinal neovascularization, unspecified, unspecified eye: Secondary | ICD-10-CM | POA: Diagnosis not present

## 2013-02-28 DIAGNOSIS — H353290 Exudative age-related macular degeneration, unspecified eye, stage unspecified: Secondary | ICD-10-CM | POA: Diagnosis not present

## 2013-03-13 ENCOUNTER — Ambulatory Visit (INDEPENDENT_AMBULATORY_CARE_PROVIDER_SITE_OTHER): Payer: Medicare Other

## 2013-03-13 DIAGNOSIS — Z954 Presence of other heart-valve replacement: Secondary | ICD-10-CM

## 2013-03-13 DIAGNOSIS — I359 Nonrheumatic aortic valve disorder, unspecified: Secondary | ICD-10-CM

## 2013-03-13 DIAGNOSIS — I6350 Cerebral infarction due to unspecified occlusion or stenosis of unspecified cerebral artery: Secondary | ICD-10-CM | POA: Diagnosis not present

## 2013-03-19 ENCOUNTER — Other Ambulatory Visit: Payer: Self-pay

## 2013-03-19 MED ORDER — DONEPEZIL HCL 10 MG PO TABS: 10.0000 mg | ORAL_TABLET | Freq: Every day | ORAL | Status: DC

## 2013-03-19 NOTE — Telephone Encounter (Signed)
Former [facility] patient, has not been assigned new provider.  Auth refills via [facility]  

## 2013-03-26 ENCOUNTER — Other Ambulatory Visit (HOSPITAL_COMMUNITY): Payer: Self-pay | Admitting: Pulmonary Disease

## 2013-03-26 ENCOUNTER — Ambulatory Visit (HOSPITAL_COMMUNITY)
Admission: RE | Admit: 2013-03-26 | Discharge: 2013-03-26 | Disposition: A | Discharge status: Home / Self Care | Payer: Medicare Other | Source: Ambulatory Visit | Attending: Pulmonary Disease | Admitting: Pulmonary Disease

## 2013-03-26 DIAGNOSIS — R05 Cough: Secondary | ICD-10-CM | POA: Diagnosis not present

## 2013-03-26 DIAGNOSIS — I359 Nonrheumatic aortic valve disorder, unspecified: Secondary | ICD-10-CM | POA: Diagnosis not present

## 2013-03-26 DIAGNOSIS — R079 Chest pain, unspecified: Secondary | ICD-10-CM

## 2013-03-26 DIAGNOSIS — I10 Essential (primary) hypertension: Secondary | ICD-10-CM | POA: Diagnosis not present

## 2013-03-26 DIAGNOSIS — R059 Cough, unspecified: Secondary | ICD-10-CM | POA: Insufficient documentation

## 2013-03-26 DIAGNOSIS — R062 Wheezing: Secondary | ICD-10-CM | POA: Diagnosis not present

## 2013-03-26 DIAGNOSIS — J449 Chronic obstructive pulmonary disease, unspecified: Secondary | ICD-10-CM | POA: Diagnosis not present

## 2013-03-26 DIAGNOSIS — Z139 Encounter for screening, unspecified: Secondary | ICD-10-CM

## 2013-03-28 ENCOUNTER — Ambulatory Visit (HOSPITAL_COMMUNITY)
Admission: RE | Admit: 2013-03-28 | Discharge: 2013-03-28 | Disposition: A | Discharge status: Home / Self Care | Payer: Medicare Other | Source: Ambulatory Visit | Attending: Pulmonary Disease | Admitting: Pulmonary Disease

## 2013-03-28 ENCOUNTER — Other Ambulatory Visit (HOSPITAL_COMMUNITY): Payer: Self-pay | Admitting: Pulmonary Disease

## 2013-03-28 ENCOUNTER — Encounter (HOSPITAL_COMMUNITY): Payer: Self-pay

## 2013-03-28 DIAGNOSIS — J438 Other emphysema: Secondary | ICD-10-CM | POA: Insufficient documentation

## 2013-03-28 DIAGNOSIS — Z951 Presence of aortocoronary bypass graft: Secondary | ICD-10-CM | POA: Insufficient documentation

## 2013-03-28 DIAGNOSIS — Z86711 Personal history of pulmonary embolism: Secondary | ICD-10-CM

## 2013-03-28 DIAGNOSIS — R0602 Shortness of breath: Secondary | ICD-10-CM

## 2013-03-28 LAB — POCT I-STAT, CHEM 8
HCT: 32 % — ABNORMAL LOW (ref 36.0–46.0)
Hemoglobin: 10.9 g/dL — ABNORMAL LOW (ref 12.0–15.0)
Potassium: 4.4 mEq/L (ref 3.5–5.1)
Sodium: 142 mEq/L (ref 135–145)

## 2013-03-28 MED ORDER — IOHEXOL 350 MG/ML SOLN
100.0000 mL | Freq: Once | INTRAVENOUS | Status: AC | PRN
  Administered 2013-03-28: 100 mL via INTRAVENOUS

## 2013-03-28 NOTE — Progress Notes (Signed)
Blood sample obtained from left arm IV for Creatnine level.  

## 2013-04-02 ENCOUNTER — Ambulatory Visit (HOSPITAL_COMMUNITY)
Admission: RE | Admit: 2013-04-02 | Discharge: 2013-04-02 | Disposition: A | Discharge status: Home / Self Care | Payer: Medicare Other | Source: Ambulatory Visit | Attending: Pulmonary Disease | Admitting: Pulmonary Disease

## 2013-04-02 DIAGNOSIS — Z1231 Encounter for screening mammogram for malignant neoplasm of breast: Secondary | ICD-10-CM | POA: Diagnosis not present

## 2013-04-02 DIAGNOSIS — Z139 Encounter for screening, unspecified: Secondary | ICD-10-CM

## 2013-04-03 ENCOUNTER — Other Ambulatory Visit: Payer: Self-pay | Admitting: Cardiology

## 2013-04-06 ENCOUNTER — Other Ambulatory Visit: Payer: Self-pay | Admitting: Pulmonary Disease

## 2013-04-06 DIAGNOSIS — R928 Other abnormal and inconclusive findings on diagnostic imaging of breast: Secondary | ICD-10-CM

## 2013-04-10 DIAGNOSIS — E785 Hyperlipidemia, unspecified: Secondary | ICD-10-CM | POA: Diagnosis not present

## 2013-04-10 DIAGNOSIS — S93409A Sprain of unspecified ligament of unspecified ankle, initial encounter: Secondary | ICD-10-CM | POA: Diagnosis not present

## 2013-04-10 DIAGNOSIS — I10 Essential (primary) hypertension: Secondary | ICD-10-CM | POA: Diagnosis not present

## 2013-04-10 DIAGNOSIS — J449 Chronic obstructive pulmonary disease, unspecified: Secondary | ICD-10-CM | POA: Diagnosis not present

## 2013-04-16 ENCOUNTER — Encounter: Payer: Self-pay | Admitting: Neurology

## 2013-04-16 ENCOUNTER — Ambulatory Visit (INDEPENDENT_AMBULATORY_CARE_PROVIDER_SITE_OTHER): Payer: Medicare Other | Admitting: Diagnostic Neuroimaging

## 2013-04-16 VITALS — BP 150/54 | HR 58 | Temp 97.8°F | Ht 63.0 in | Wt 114.5 lb

## 2013-04-16 DIAGNOSIS — F29 Unspecified psychosis not due to a substance or known physiological condition: Secondary | ICD-10-CM

## 2013-04-16 DIAGNOSIS — R410 Disorientation, unspecified: Secondary | ICD-10-CM | POA: Insufficient documentation

## 2013-04-16 DIAGNOSIS — R413 Other amnesia: Secondary | ICD-10-CM | POA: Insufficient documentation

## 2013-04-16 NOTE — Progress Notes (Signed)
[facility]  PATIENT: [name] DOB: 1926-01-09  REFERRING CLINICIAN:  HISTORY FROM: patient and daughter REASON FOR VISIT: follow up   HISTORICAL  CHIEF COMPLAINT:  Chief Complaint  Patient presents with  . Establish Care    transfer of care from Dr Sandria Manly.  6 mth f/u.    HISTORY OF PRESENT ILLNESS:   UPDATE 04/16/13: since last visit, patient had one more episode of confusion, mild on 01/23/13. Patient does not recall this. Daughter and her friend noticed she was acting confused, with mild recall problems and being quiet. Episode lasted 24-48 hours. Patient continues to live on her own. Daughter lives 8 miles away. Patient continues to drive a car to the gym and grocery store.  PRIOR HPI (10/17/12): 79 year old ambidextrous white widowed female who lives alone with a history of  about 20 episodes of confusion thought to represent transient global amnesia  beginning June 2008 through 8/22013. Each episode lasts about 24 hours.  As in the past her confusion usually clears after she wakes from going to sleep. The episodes of confusion or transient global amnesia are sometimes preceded by headache. This headache is bifrontal. She has no other neurologic symptoms.She had an extensive evaluation including EEGs x2, CT scans x2 ,B12, RPR, and TSH.Her B12 was borderline low at 295 and a serum methylmalonic acid 07/24/2009 was 286, in the normal range. She is independent in her activities of daily living and does drive a car which I have asked her not to do so. Her daughter calls her before she leaves the home. Her daughter says that she gets real quiet after  an episode and that the patient feels "sleepy" and  when she is coming out of it . She repeats questions over and over. The patient goes 3 days per week to the gym and exercises by walking. She has had cardiac evaluation by Dr. Daleen Squibb. 10/22/11= MMSE 28/30, missing calculations. CDT 4/4. AFT 12. Myrtis Ser Index of  independence in  activities of daily living 6. Law[place]-Brody Instrumental activities of daily living scale 8. Neuropsychiatric inventory= zero. Geriatric depression scale 1/15. Falls assessment tool score is 8. She had one episode 11/28/11. 04/18/12= MMSE 25/30. Clock drawing task 4/4. Animal fluency test 11.She had another episode A./22/201 3. She was eating in a restaurant after going to the gym.She went to sleep afterwards for 24 hours.11/13/2012= MMSE 28/30.Clock drawing task 4/4.Animal fluency test 13. She denies other neurologic symptoms  REVIEW OF SYSTEMS: Full 14 system review of systems performed and notable only for weight loss hearing loss shortness of breath diarrhea joint pain feeling cold easily bruising memory loss confusion numbness.  ALLERGIES: Allergies  Allergen Reactions  . Codeine   . Penicillins     HOME MEDICATIONS: Outpatient Prescriptions Prior to Visit  Medication Sig Dispense Refill  . amLODipine-benazepril (LOTREL) 5-20 MG per capsule TAKE ONE CAPSULE DAILY.  30 capsule  5  . aspirin 81 MG EC tablet Take 81 mg by mouth daily.        Marland Kitchen atorvastatin (LIPITOR) 40 MG tablet TAKE ONE TABLET DAILY.  30 tablet  5  . donepezil (ARICEPT) 10 MG tablet Take 1 tablet (10 mg total) by mouth daily.  90 tablet  2  . ergocalciferol (VITAMIN D2) 50000 UNITS capsule Take 50,000 Units by mouth once a week.       . furosemide (LASIX) 20 MG tablet TAKE ONE TABLET BY MOUTH ONCE DAILY.  30 tablet  5  . Melatonin 3  MG TABS Take 3 mg by mouth at bedtime.        . metoprolol (LOPRESSOR) 50 MG tablet TAKE (1) TABLET BY MOUTH TWICE DAILY.  60 tablet  5  . Multiple Vitamins-Minerals (VITEYES AREDS FORMULA PO) Take 1 tablet by mouth daily.      . potassium chloride (K-DUR,KLOR-CON) 10 MEQ tablet Take 1 tablet (10 mEq total) by mouth daily.  30 tablet  6  . pregabalin (LYRICA) 75 MG capsule Take 75 mg by mouth every other day. At bedtime       . traZODone (DESYREL) 50 MG tablet Take 1 tablet by mouth at bedtime as  needed.      . warfarin (COUMADIN) 5 MG tablet Take 5 mg by mouth daily. Take 5 mg (1 tablet) on Monday, Wed, and Friday, then take 2.5 mg (1/2 tablet) all other days      . ZYMAXID 0.5 % SOLN Place 1 drop into the left eye as needed. Around every 6 weeks for 3 days after Avastin Shot       No facility-administered medications prior to visit.    PAST MEDICAL HISTORY: Past Medical History  Diagnosis Date  . Encounter for long-term (current) use of other medications   . Carotid artery stenosis     Without infarction  . Stroke   . Hypertension     Unspecified  . Coronary artery disease   . Hypercholesterolemia     Pure  . Mixed hyperlipidemia   . LBBB (left bundle branch block)   . Macular degeneration (senile) of retina, unspecified   . Unspecified vitamin D deficiency   . Postsurgical aortocoronary bypass status   . Unspecified cardiovascular disease   . Unspecified hereditary and idiopathic peripheral neuropathy   . Transient global amnesia   . Cerebrovascular disease, unspecified   . Heart valve replaced by other means   . Coronary atherosclerosis of unspecified type of vessel, native or graft     PAST SURGICAL HISTORY: Past Surgical History  Procedure Laterality Date  . Aortic valve replacement    . Coronary artery bypass graft    . Appendectomy    . Tonsillectomy      FAMILY HISTORY: Family History  Problem Relation Age of Onset  . Heart attack Father   . Stroke Sister   . Neuropathy Brother   . COPD Sister     SOCIAL HISTORY:  History   Social History  . Marital Status: Widowed    Spouse Name: N/A    Number of Children: 2  . Years of Education: 12th   Occupational History  .     Social History Main Topics  . Smoking status: Former Smoker -- 0.50 packs/day    Types: Cigarettes    Quit date: 11/30/1991  . Smokeless tobacco: Never Used     Comment: Tobacco use-no  . Alcohol Use: No  . Drug Use: No  . Sexually Active: No   Other Topics Concern    . Not on file   Social History Narrative   Pt lives at home alone.   Caffeine Use: very rarely     PHYSICAL EXAM  Filed Vitals:   04/16/13 1125  BP: 150/54  Pulse: 58  Temp: 97.8 F (36.6 C)  TempSrc: Oral  Height: 5\' 3"  (1.6 m)  Weight: 114 lb 8 oz (51.937 kg)    Not recorded    Body mass index is 20.29 kg/(m^2).  GENERAL EXAM: Patient is in no distress  CARDIOVASCULAR:  Regular rate and rhythm, no murmurs, no carotid bruits  NEUROLOGIC: MENTAL STATUS: awake, alert, language fluent, comprehension intact, naming intact; NED SNOUT CRANIAL NERVE: no papilledema on fundoscopic exam, pupils equal and reactive to light, visual fields full to confrontation, extraocular muscles intact, no nystagmus, facial sensation and strength symmetric, uvula midline, shoulder shrug symmetric, tongue midline. MOTOR: normal bulk and tone, full strength in the BUE, BLE SENSORY: normal and symmetric to light touch, pinprick, temperature, vibration COORDINATION: finger-nose-finger, fine finger movements normal REFLEXES: deep tendon reflexes present and symmetric GAIT/STATION: narrow based gait; romberg is negative   DIAGNOSTIC DATA (LABS, IMAGING, TESTING) - I reviewed patient records, labs, notes, testing and imaging myself where available.  Lab Results  Component Value Date   WBC 10.3 11/28/2012   HGB 10.9* 03/28/2013   HCT 32.0* 03/28/2013   MCV 92.6 11/28/2012   PLT 315 11/28/2012      Component Value Date/Time   NA 142 03/28/2013 1554   K 4.4 03/28/2013 1554   CL 110 03/28/2013 1554   CO2 37* 11/29/2012 0537   GLUCOSE 112* 03/28/2013 1554   BUN 22 03/28/2013 1554   CREATININE 1.00 03/28/2013 1554   CALCIUM 8.8 11/29/2012 0537   PROT 7.3 11/23/2012 1312   ALBUMIN 3.5 11/23/2012 1312   AST 23 11/23/2012 1312   ALT 23 11/23/2012 1312   ALKPHOS 99 11/23/2012 1312   BILITOT 1.0 11/23/2012 1312   GFRNONAA 58* 11/29/2012 0537   GFRAA 67* 11/29/2012 0537   Lab Results  Component Value Date   CHOL  146 06/16/2010   HDL 54.20 06/16/2010   LDLCALC 80 06/16/2010   TRIG 61.0 06/16/2010   CHOLHDL 3 06/16/2010   No results found for this basename: HGBA1C   No results found for this basename: VITAMINB12   No results found for this basename: TSH    12/27/08 CT head - mild chronic small vessel ischemic disease and mild atrophy   ASSESSMENT AND PLAN  79 y.o. year old female  has a past medical history of Encounter for long-term (current) use of other medications; Carotid artery stenosis; Stroke; Hypertension; Coronary artery disease; Hypercholesterolemia; Mixed hyperlipidemia; LBBB (left bundle branch block); Macular degeneration (senile) of retina, unspecified; Unspecified vitamin D deficiency; Postsurgical aortocoronary bypass status; Unspecified cardiovascular disease; Unspecified hereditary and idiopathic peripheral neuropathy; Transient global amnesia; Cerebrovascular disease, unspecified; Heart valve replaced by other means; and Coronary atherosclerosis of unspecified type of vessel, native or graft. here with intermittent episodes of confusion, memory loss. Previously dx'd with recurrent transient global amnesia, multiple events (10-15) since 2008. Has had extensive testing in the past. No further neurologic testing advised.   Ddx: TIA, vascular dementia, seizure, metabolic, cardiogenic (cerebral hypoperfusion)   PLAN: 1. Continue medical mgmt 2. Should transition away from driving, due to unpredictable spells of confusion; this was recommended by Dr. Sandria Manly in the past as well (since 2009) 3. Follow up 1 year   Suanne Marker, MD 04/16/2013, 12:34 PM Certified in Neurology, Neurophysiology and Neuroimaging  [facility] [address] [place], [place] [address] [phone]

## 2013-04-16 NOTE — Patient Instructions (Signed)
Follow up 1 year 

## 2013-04-17 ENCOUNTER — Ambulatory Visit (INDEPENDENT_AMBULATORY_CARE_PROVIDER_SITE_OTHER): Payer: Medicare Other | Admitting: *Deleted

## 2013-04-17 DIAGNOSIS — Z954 Presence of other heart-valve replacement: Secondary | ICD-10-CM | POA: Diagnosis not present

## 2013-04-17 DIAGNOSIS — I359 Nonrheumatic aortic valve disorder, unspecified: Secondary | ICD-10-CM

## 2013-04-17 DIAGNOSIS — I6350 Cerebral infarction due to unspecified occlusion or stenosis of unspecified cerebral artery: Secondary | ICD-10-CM | POA: Diagnosis not present

## 2013-04-17 LAB — POCT INR: INR: 2.7

## 2013-04-18 ENCOUNTER — Ambulatory Visit
Admission: RE | Admit: 2013-04-18 | Discharge: 2013-04-18 | Disposition: A | Discharge status: Home / Self Care | Payer: Medicare Other | Source: Ambulatory Visit | Attending: Pulmonary Disease | Admitting: Pulmonary Disease

## 2013-04-18 DIAGNOSIS — R928 Other abnormal and inconclusive findings on diagnostic imaging of breast: Secondary | ICD-10-CM

## 2013-04-25 DIAGNOSIS — H353290 Exudative age-related macular degeneration, unspecified eye, stage unspecified: Secondary | ICD-10-CM | POA: Diagnosis not present

## 2013-04-25 DIAGNOSIS — H35059 Retinal neovascularization, unspecified, unspecified eye: Secondary | ICD-10-CM | POA: Diagnosis not present

## 2013-05-10 DIAGNOSIS — J209 Acute bronchitis, unspecified: Secondary | ICD-10-CM | POA: Diagnosis not present

## 2013-05-10 DIAGNOSIS — R42 Dizziness and giddiness: Secondary | ICD-10-CM | POA: Diagnosis not present

## 2013-05-29 ENCOUNTER — Ambulatory Visit (INDEPENDENT_AMBULATORY_CARE_PROVIDER_SITE_OTHER): Payer: Medicare Other | Admitting: Pharmacist

## 2013-05-29 ENCOUNTER — Encounter (INDEPENDENT_AMBULATORY_CARE_PROVIDER_SITE_OTHER): Payer: Medicare Other

## 2013-05-29 DIAGNOSIS — I359 Nonrheumatic aortic valve disorder, unspecified: Secondary | ICD-10-CM

## 2013-05-29 DIAGNOSIS — Z954 Presence of other heart-valve replacement: Secondary | ICD-10-CM | POA: Diagnosis not present

## 2013-05-29 DIAGNOSIS — I6529 Occlusion and stenosis of unspecified carotid artery: Secondary | ICD-10-CM | POA: Diagnosis not present

## 2013-05-29 DIAGNOSIS — I6350 Cerebral infarction due to unspecified occlusion or stenosis of unspecified cerebral artery: Secondary | ICD-10-CM | POA: Diagnosis not present

## 2013-05-29 LAB — POCT INR: INR: 1.7

## 2013-06-07 DIAGNOSIS — J449 Chronic obstructive pulmonary disease, unspecified: Secondary | ICD-10-CM | POA: Diagnosis not present

## 2013-06-07 DIAGNOSIS — I10 Essential (primary) hypertension: Secondary | ICD-10-CM | POA: Diagnosis not present

## 2013-06-07 DIAGNOSIS — G459 Transient cerebral ischemic attack, unspecified: Secondary | ICD-10-CM | POA: Diagnosis not present

## 2013-06-16 ENCOUNTER — Other Ambulatory Visit: Payer: Self-pay | Admitting: Cardiology

## 2013-06-26 ENCOUNTER — Ambulatory Visit (INDEPENDENT_AMBULATORY_CARE_PROVIDER_SITE_OTHER): Payer: Medicare Other | Admitting: Pharmacist

## 2013-06-26 DIAGNOSIS — I6350 Cerebral infarction due to unspecified occlusion or stenosis of unspecified cerebral artery: Secondary | ICD-10-CM | POA: Diagnosis not present

## 2013-06-26 DIAGNOSIS — Z954 Presence of other heart-valve replacement: Secondary | ICD-10-CM

## 2013-06-26 DIAGNOSIS — I359 Nonrheumatic aortic valve disorder, unspecified: Secondary | ICD-10-CM

## 2013-06-26 LAB — POCT INR: INR: 2.4

## 2013-07-04 DIAGNOSIS — H353290 Exudative age-related macular degeneration, unspecified eye, stage unspecified: Secondary | ICD-10-CM | POA: Diagnosis not present

## 2013-07-04 DIAGNOSIS — H35059 Retinal neovascularization, unspecified, unspecified eye: Secondary | ICD-10-CM | POA: Diagnosis not present

## 2013-07-19 ENCOUNTER — Ambulatory Visit (INDEPENDENT_AMBULATORY_CARE_PROVIDER_SITE_OTHER): Payer: Medicare Other | Admitting: Cardiology

## 2013-07-19 ENCOUNTER — Ambulatory Visit (INDEPENDENT_AMBULATORY_CARE_PROVIDER_SITE_OTHER): Payer: Medicare Other

## 2013-07-19 ENCOUNTER — Encounter: Payer: Self-pay | Admitting: Cardiology

## 2013-07-19 VITALS — BP 122/70 | HR 50 | Wt 111.0 lb

## 2013-07-19 DIAGNOSIS — Z954 Presence of other heart-valve replacement: Secondary | ICD-10-CM

## 2013-07-19 DIAGNOSIS — I25810 Atherosclerosis of coronary artery bypass graft(s) without angina pectoris: Secondary | ICD-10-CM | POA: Diagnosis not present

## 2013-07-19 DIAGNOSIS — I6350 Cerebral infarction due to unspecified occlusion or stenosis of unspecified cerebral artery: Secondary | ICD-10-CM | POA: Diagnosis not present

## 2013-07-19 DIAGNOSIS — I6529 Occlusion and stenosis of unspecified carotid artery: Secondary | ICD-10-CM

## 2013-07-19 DIAGNOSIS — I10 Essential (primary) hypertension: Secondary | ICD-10-CM

## 2013-07-19 DIAGNOSIS — I359 Nonrheumatic aortic valve disorder, unspecified: Secondary | ICD-10-CM | POA: Diagnosis not present

## 2013-07-19 DIAGNOSIS — E785 Hyperlipidemia, unspecified: Secondary | ICD-10-CM

## 2013-07-19 NOTE — Assessment & Plan Note (Signed)
Continue statin. Followup carotid Dopplers July 2015. 

## 2013-07-19 NOTE — Progress Notes (Signed)
HPI: 79 year old female previously followed by Dr. Daleen Squibb for followup of coronary artery disease (s/p CABG), prior aortic valve replacement and aortic insufficiency. Last cardiac catheterization in 2011 revealed normal left main; 30% first diagonal, 50% LAD and 60% second diagonal. There was a 40-50% circumflex and a 40% right coronary artery. All grafts were occluded. Medical therapy recommended. Transesophageal echocardiogram in February of 2012 showed an ejection fraction of 45%. There was a St. Jude aortic valve with moderate perivalvular aortic insufficiency. There was moderate mitral regurgitation, moderate left atrial enlargement, mild right atrial enlargement and mild tricuspid regurgitation. Carotid Dopplers in July of 2013 showed 40-59% bilateral stenosis and followup recommended in one year. She was last seen in April of 2014. Since then, she has some dyspnea on exertion but no orthopnea, PND, pedal edema, syncope or chest pain.  Current Outpatient Prescriptions  Medication Sig Dispense Refill  . amLODipine-benazepril (LOTREL) 5-20 MG per capsule TAKE ONE CAPSULE DAILY.  30 capsule  5  . aspirin 81 MG EC tablet Take 81 mg by mouth daily.        Marland Kitchen atorvastatin (LIPITOR) 40 MG tablet TAKE ONE TABLET DAILY.  30 tablet  5  . donepezil (ARICEPT) 10 MG tablet Take 1 tablet (10 mg total) by mouth daily.  90 tablet  2  . ergocalciferol (VITAMIN D2) 50000 UNITS capsule Take 50,000 Units by mouth once a week.       . furosemide (LASIX) 20 MG tablet TAKE ONE TABLET BY MOUTH ONCE DAILY.  30 tablet  5  . HYDROcodone-acetaminophen (NORCO/VICODIN) 5-325 MG per tablet as needed.      . Melatonin 3 MG TABS Take 3 mg by mouth at bedtime.        . metoprolol (LOPRESSOR) 50 MG tablet 1/2 tab po bid      . Multiple Vitamins-Minerals (VITEYES AREDS FORMULA PO) Take 1 tablet by mouth daily.      . potassium chloride (K-DUR,KLOR-CON) 10 MEQ tablet TAKE ONE TABLET DAILY.  30 tablet  3  . pregabalin (LYRICA) 75  MG capsule Take 75 mg by mouth every other day. At bedtime       . traMADol (ULTRAM) 50 MG tablet as needed.      . traZODone (DESYREL) 50 MG tablet Take 1 tablet by mouth at bedtime as needed.      . warfarin (COUMADIN) 5 MG tablet Take 5 mg by mouth daily. Take 5 mg (1 tablet) on Monday, Wed, and Friday, then take 2.5 mg (1/2 tablet) all other days      . ZYMAXID 0.5 % SOLN Place 1 drop into the left eye as needed. Around every 6 weeks for 3 days after Avastin Shot       No current facility-administered medications for this visit.     Past Medical History  Diagnosis Date  . Carotid artery stenosis     Without infarction  . Stroke   . Hypertension     Unspecified  . Coronary artery disease   . Hypercholesterolemia     Pure  . LBBB (left bundle branch block)   . Macular degeneration (senile) of retina, unspecified   . Unspecified vitamin D deficiency   . Postsurgical aortocoronary bypass status   . Unspecified hereditary and idiopathic peripheral neuropathy   . Transient global amnesia   . Heart valve replaced by other means     Past Surgical History  Procedure Laterality Date  . Aortic valve replacement    . Coronary  artery bypass graft    . Appendectomy    . Tonsillectomy      History   Social History  . Marital Status: Widowed    Spouse Name: N/A    Number of Children: 2  . Years of Education: 12th   Occupational History  .     Social History Main Topics  . Smoking status: Former Smoker -- 0.50 packs/day    Types: Cigarettes    Quit date: 11/30/1991  . Smokeless tobacco: Never Used     Comment: Tobacco use-no  . Alcohol Use: No  . Drug Use: No  . Sexual Activity: No   Other Topics Concern  . Not on file   Social History Narrative   Pt lives at home alone.   Caffeine Use: very rarely    ROS: no fevers or chills, productive cough, hemoptysis, dysphasia, odynophagia, melena, hematochezia, dysuria, hematuria, rash, seizure activity, orthopnea, PND,  pedal edema, claudication. Remaining systems are negative.  Physical Exam: Well-developed well-nourished in no acute distress.  Skin is warm and dry.  HEENT is normal.  Neck is supple.  Chest is clear to auscultation with normal expansion.  Cardiovascular exam is regular rate and rhythm. Crisp mechanical valve sound. 2/6 systolic murmur radiating to the carotids. 2/6 diastolic murmur. Abdominal exam nontender or distended. No masses palpated. Extremities show no edema. neuro grossly intact  ECG sinus bradycardia at a rate of 50. Left bundle branch block.

## 2013-07-19 NOTE — Patient Instructions (Addendum)
Your physician wants you to follow-up in: 6 MONTHS WITH DR Jens Som You will receive a reminder letter in the mail two months in advance. If you don't receive a letter, please call our office to schedule the follow-up appointment.   Your physician has requested that you have an echocardiogram. Echocardiography is a painless test that uses sound waves to create images of your heart. It provides your doctor with information about the size and shape of your heart and how well your heart's chambers and valves are working. This procedure takes approximately one hour. There are no restrictions for this procedure.   Your physician recommends that you return for lab work FASTING WITH ECHO

## 2013-07-19 NOTE — Assessment & Plan Note (Signed)
Continue statin. 

## 2013-07-19 NOTE — Assessment & Plan Note (Signed)
Continue present blood pressure medications. Check potassium and renal function. 

## 2013-07-19 NOTE — Assessment & Plan Note (Addendum)
Patient is status post aortic valve replacement. Continue SBE prophylaxis. She has a history of moderate aortic insufficiency. She is asymptomatic. Repeat echocardiogram. Conservative measures if possible. Continue aspirin and Coumadin. Check hemoglobin.

## 2013-07-19 NOTE — Assessment & Plan Note (Signed)
Continue statin. Check lipids and liver. 

## 2013-08-06 ENCOUNTER — Ambulatory Visit (HOSPITAL_COMMUNITY): Payer: Medicare Other | Attending: Cardiology | Admitting: Radiology

## 2013-08-06 ENCOUNTER — Other Ambulatory Visit (INDEPENDENT_AMBULATORY_CARE_PROVIDER_SITE_OTHER): Payer: Medicare Other

## 2013-08-06 DIAGNOSIS — I25810 Atherosclerosis of coronary artery bypass graft(s) without angina pectoris: Secondary | ICD-10-CM

## 2013-08-06 DIAGNOSIS — Z87891 Personal history of nicotine dependence: Secondary | ICD-10-CM | POA: Diagnosis not present

## 2013-08-06 DIAGNOSIS — I359 Nonrheumatic aortic valve disorder, unspecified: Secondary | ICD-10-CM

## 2013-08-06 DIAGNOSIS — E785 Hyperlipidemia, unspecified: Secondary | ICD-10-CM | POA: Insufficient documentation

## 2013-08-06 DIAGNOSIS — Z8673 Personal history of transient ischemic attack (TIA), and cerebral infarction without residual deficits: Secondary | ICD-10-CM | POA: Insufficient documentation

## 2013-08-06 DIAGNOSIS — Z954 Presence of other heart-valve replacement: Secondary | ICD-10-CM | POA: Insufficient documentation

## 2013-08-06 DIAGNOSIS — I2789 Other specified pulmonary heart diseases: Secondary | ICD-10-CM | POA: Insufficient documentation

## 2013-08-06 DIAGNOSIS — I2510 Atherosclerotic heart disease of native coronary artery without angina pectoris: Secondary | ICD-10-CM | POA: Insufficient documentation

## 2013-08-06 DIAGNOSIS — I10 Essential (primary) hypertension: Secondary | ICD-10-CM | POA: Diagnosis not present

## 2013-08-06 DIAGNOSIS — I447 Left bundle-branch block, unspecified: Secondary | ICD-10-CM | POA: Diagnosis not present

## 2013-08-06 DIAGNOSIS — I059 Rheumatic mitral valve disease, unspecified: Secondary | ICD-10-CM | POA: Insufficient documentation

## 2013-08-06 LAB — LIPID PANEL: Total CHOL/HDL Ratio: 2

## 2013-08-06 LAB — CBC WITH DIFFERENTIAL/PLATELET
Basophils Absolute: 0 10*3/uL (ref 0.0–0.1)
Eosinophils Absolute: 0.1 10*3/uL (ref 0.0–0.7)
Hemoglobin: 11.3 g/dL — ABNORMAL LOW (ref 12.0–15.0)
Lymphocytes Relative: 20.9 % (ref 12.0–46.0)
MCHC: 33.7 g/dL (ref 30.0–36.0)
MCV: 90.6 fl (ref 78.0–100.0)
Monocytes Absolute: 0.7 10*3/uL (ref 0.1–1.0)
Neutro Abs: 6.3 10*3/uL (ref 1.4–7.7)
Neutrophils Relative %: 70.1 % (ref 43.0–77.0)
RDW: 14 % (ref 11.5–14.6)

## 2013-08-06 LAB — BASIC METABOLIC PANEL
CO2: 30 mEq/L (ref 19–32)
Calcium: 9.2 mg/dL (ref 8.4–10.5)
Chloride: 106 mEq/L (ref 96–112)
Creatinine, Ser: 1 mg/dL (ref 0.4–1.2)
Glucose, Bld: 82 mg/dL (ref 70–99)

## 2013-08-06 LAB — HEPATIC FUNCTION PANEL
AST: 28 U/L (ref 0–37)
Alkaline Phosphatase: 69 U/L (ref 39–117)
Total Bilirubin: 0.7 mg/dL (ref 0.3–1.2)

## 2013-08-06 NOTE — Progress Notes (Signed)
Echocardiogram performed.  

## 2013-08-14 DIAGNOSIS — H902 Conductive hearing loss, unspecified: Secondary | ICD-10-CM | POA: Diagnosis not present

## 2013-08-14 DIAGNOSIS — I10 Essential (primary) hypertension: Secondary | ICD-10-CM | POA: Diagnosis not present

## 2013-08-14 DIAGNOSIS — E785 Hyperlipidemia, unspecified: Secondary | ICD-10-CM | POA: Diagnosis not present

## 2013-08-14 DIAGNOSIS — J449 Chronic obstructive pulmonary disease, unspecified: Secondary | ICD-10-CM | POA: Diagnosis not present

## 2013-08-14 DIAGNOSIS — Z23 Encounter for immunization: Secondary | ICD-10-CM | POA: Diagnosis not present

## 2013-08-16 ENCOUNTER — Ambulatory Visit (INDEPENDENT_AMBULATORY_CARE_PROVIDER_SITE_OTHER): Payer: Medicare Other | Admitting: *Deleted

## 2013-08-16 DIAGNOSIS — I359 Nonrheumatic aortic valve disorder, unspecified: Secondary | ICD-10-CM

## 2013-08-16 DIAGNOSIS — I6350 Cerebral infarction due to unspecified occlusion or stenosis of unspecified cerebral artery: Secondary | ICD-10-CM | POA: Diagnosis not present

## 2013-08-16 DIAGNOSIS — Z954 Presence of other heart-valve replacement: Secondary | ICD-10-CM | POA: Diagnosis not present

## 2013-08-17 ENCOUNTER — Other Ambulatory Visit: Payer: Self-pay | Admitting: Cardiology

## 2013-08-21 ENCOUNTER — Other Ambulatory Visit: Payer: Self-pay | Admitting: Cardiology

## 2013-08-25 ENCOUNTER — Other Ambulatory Visit: Payer: Self-pay | Admitting: Cardiology

## 2013-09-06 ENCOUNTER — Encounter (INDEPENDENT_AMBULATORY_CARE_PROVIDER_SITE_OTHER): Payer: Self-pay

## 2013-09-06 ENCOUNTER — Ambulatory Visit (INDEPENDENT_AMBULATORY_CARE_PROVIDER_SITE_OTHER): Payer: Medicare Other | Admitting: Otolaryngology

## 2013-09-06 DIAGNOSIS — H6120 Impacted cerumen, unspecified ear: Secondary | ICD-10-CM | POA: Diagnosis not present

## 2013-09-06 DIAGNOSIS — H903 Sensorineural hearing loss, bilateral: Secondary | ICD-10-CM

## 2013-09-11 ENCOUNTER — Ambulatory Visit (INDEPENDENT_AMBULATORY_CARE_PROVIDER_SITE_OTHER): Payer: Medicare Other | Admitting: *Deleted

## 2013-09-11 ENCOUNTER — Other Ambulatory Visit: Payer: Self-pay | Admitting: Pulmonary Disease

## 2013-09-11 DIAGNOSIS — Z954 Presence of other heart-valve replacement: Secondary | ICD-10-CM | POA: Diagnosis not present

## 2013-09-11 DIAGNOSIS — I359 Nonrheumatic aortic valve disorder, unspecified: Secondary | ICD-10-CM | POA: Diagnosis not present

## 2013-09-11 DIAGNOSIS — N63 Unspecified lump in unspecified breast: Secondary | ICD-10-CM

## 2013-09-11 DIAGNOSIS — I6350 Cerebral infarction due to unspecified occlusion or stenosis of unspecified cerebral artery: Secondary | ICD-10-CM | POA: Diagnosis not present

## 2013-09-11 DIAGNOSIS — N6011 Diffuse cystic mastopathy of right breast: Secondary | ICD-10-CM

## 2013-09-12 DIAGNOSIS — H353290 Exudative age-related macular degeneration, unspecified eye, stage unspecified: Secondary | ICD-10-CM | POA: Diagnosis not present

## 2013-09-12 DIAGNOSIS — H35059 Retinal neovascularization, unspecified, unspecified eye: Secondary | ICD-10-CM | POA: Diagnosis not present

## 2013-09-18 ENCOUNTER — Other Ambulatory Visit: Payer: Self-pay | Admitting: Cardiology

## 2013-09-24 ENCOUNTER — Other Ambulatory Visit: Payer: Self-pay | Admitting: Cardiology

## 2013-09-25 ENCOUNTER — Ambulatory Visit (INDEPENDENT_AMBULATORY_CARE_PROVIDER_SITE_OTHER): Payer: Medicare Other | Admitting: General Practice

## 2013-09-25 DIAGNOSIS — I6350 Cerebral infarction due to unspecified occlusion or stenosis of unspecified cerebral artery: Secondary | ICD-10-CM | POA: Diagnosis not present

## 2013-09-25 DIAGNOSIS — I359 Nonrheumatic aortic valve disorder, unspecified: Secondary | ICD-10-CM | POA: Diagnosis not present

## 2013-09-25 DIAGNOSIS — Z954 Presence of other heart-valve replacement: Secondary | ICD-10-CM

## 2013-09-25 LAB — POCT INR: INR: 2.8

## 2013-10-16 ENCOUNTER — Ambulatory Visit (INDEPENDENT_AMBULATORY_CARE_PROVIDER_SITE_OTHER): Payer: Medicare Other | Admitting: General Practice

## 2013-10-16 ENCOUNTER — Other Ambulatory Visit: Payer: Self-pay | Admitting: Cardiology

## 2013-10-16 DIAGNOSIS — I6350 Cerebral infarction due to unspecified occlusion or stenosis of unspecified cerebral artery: Secondary | ICD-10-CM | POA: Diagnosis not present

## 2013-10-16 DIAGNOSIS — I359 Nonrheumatic aortic valve disorder, unspecified: Secondary | ICD-10-CM | POA: Diagnosis not present

## 2013-10-16 DIAGNOSIS — Z954 Presence of other heart-valve replacement: Secondary | ICD-10-CM

## 2013-10-19 ENCOUNTER — Telehealth: Payer: Self-pay | Admitting: Diagnostic Neuroimaging

## 2013-11-12 ENCOUNTER — Other Ambulatory Visit (HOSPITAL_COMMUNITY): Payer: Self-pay | Admitting: Pulmonary Disease

## 2013-11-12 ENCOUNTER — Ambulatory Visit (HOSPITAL_COMMUNITY)
Admission: RE | Admit: 2013-11-12 | Discharge: 2013-11-12 | Disposition: A | Discharge status: Home / Self Care | Payer: Medicare Other | Source: Ambulatory Visit | Attending: Pulmonary Disease | Admitting: Pulmonary Disease

## 2013-11-12 DIAGNOSIS — I517 Cardiomegaly: Secondary | ICD-10-CM | POA: Diagnosis not present

## 2013-11-12 DIAGNOSIS — J449 Chronic obstructive pulmonary disease, unspecified: Secondary | ICD-10-CM | POA: Insufficient documentation

## 2013-11-12 DIAGNOSIS — R0602 Shortness of breath: Secondary | ICD-10-CM | POA: Insufficient documentation

## 2013-11-12 DIAGNOSIS — J4489 Other specified chronic obstructive pulmonary disease: Secondary | ICD-10-CM | POA: Diagnosis not present

## 2013-11-12 DIAGNOSIS — R05 Cough: Secondary | ICD-10-CM | POA: Insufficient documentation

## 2013-11-12 DIAGNOSIS — M479 Spondylosis, unspecified: Secondary | ICD-10-CM | POA: Diagnosis not present

## 2013-11-12 DIAGNOSIS — R059 Cough, unspecified: Secondary | ICD-10-CM | POA: Diagnosis not present

## 2013-11-12 DIAGNOSIS — I509 Heart failure, unspecified: Secondary | ICD-10-CM | POA: Diagnosis not present

## 2013-11-12 DIAGNOSIS — Z23 Encounter for immunization: Secondary | ICD-10-CM | POA: Diagnosis not present

## 2013-11-12 DIAGNOSIS — J209 Acute bronchitis, unspecified: Secondary | ICD-10-CM | POA: Diagnosis not present

## 2013-11-14 ENCOUNTER — Other Ambulatory Visit: Payer: Self-pay | Admitting: Cardiology

## 2013-11-14 ENCOUNTER — Ambulatory Visit (INDEPENDENT_AMBULATORY_CARE_PROVIDER_SITE_OTHER): Payer: Medicare Other | Admitting: Pharmacist

## 2013-11-14 DIAGNOSIS — Z954 Presence of other heart-valve replacement: Secondary | ICD-10-CM | POA: Diagnosis not present

## 2013-11-14 DIAGNOSIS — I6350 Cerebral infarction due to unspecified occlusion or stenosis of unspecified cerebral artery: Secondary | ICD-10-CM

## 2013-11-14 DIAGNOSIS — J209 Acute bronchitis, unspecified: Secondary | ICD-10-CM | POA: Diagnosis not present

## 2013-11-14 DIAGNOSIS — I359 Nonrheumatic aortic valve disorder, unspecified: Secondary | ICD-10-CM

## 2013-11-14 LAB — POCT INR: INR: 3.7

## 2013-11-20 ENCOUNTER — Other Ambulatory Visit: Payer: Medicare Other

## 2013-11-22 ENCOUNTER — Ambulatory Visit (INDEPENDENT_AMBULATORY_CARE_PROVIDER_SITE_OTHER): Payer: Medicare Other | Admitting: Cardiology

## 2013-11-22 DIAGNOSIS — J449 Chronic obstructive pulmonary disease, unspecified: Secondary | ICD-10-CM | POA: Diagnosis not present

## 2013-11-22 DIAGNOSIS — Z954 Presence of other heart-valve replacement: Secondary | ICD-10-CM

## 2013-11-22 DIAGNOSIS — I359 Nonrheumatic aortic valve disorder, unspecified: Secondary | ICD-10-CM

## 2013-11-22 DIAGNOSIS — J209 Acute bronchitis, unspecified: Secondary | ICD-10-CM | POA: Diagnosis not present

## 2013-11-22 DIAGNOSIS — Z7901 Long term (current) use of anticoagulants: Secondary | ICD-10-CM | POA: Diagnosis not present

## 2013-11-22 DIAGNOSIS — I10 Essential (primary) hypertension: Secondary | ICD-10-CM | POA: Diagnosis not present

## 2013-11-22 DIAGNOSIS — I6350 Cerebral infarction due to unspecified occlusion or stenosis of unspecified cerebral artery: Secondary | ICD-10-CM

## 2013-11-22 LAB — PROTIME-INR: INR: 4.5 — AB (ref 0.9–1.1)

## 2013-11-22 LAB — POCT INR: INR: 7.4

## 2013-12-03 ENCOUNTER — Ambulatory Visit (INDEPENDENT_AMBULATORY_CARE_PROVIDER_SITE_OTHER): Payer: Medicare Other | Admitting: *Deleted

## 2013-12-03 DIAGNOSIS — Z954 Presence of other heart-valve replacement: Secondary | ICD-10-CM | POA: Diagnosis not present

## 2013-12-03 DIAGNOSIS — I6350 Cerebral infarction due to unspecified occlusion or stenosis of unspecified cerebral artery: Secondary | ICD-10-CM

## 2013-12-03 DIAGNOSIS — I359 Nonrheumatic aortic valve disorder, unspecified: Secondary | ICD-10-CM

## 2013-12-03 LAB — POCT INR: INR: 2.7

## 2013-12-12 ENCOUNTER — Ambulatory Visit (INDEPENDENT_AMBULATORY_CARE_PROVIDER_SITE_OTHER): Payer: Medicare Other | Admitting: *Deleted

## 2013-12-12 DIAGNOSIS — Z954 Presence of other heart-valve replacement: Secondary | ICD-10-CM

## 2013-12-12 DIAGNOSIS — I359 Nonrheumatic aortic valve disorder, unspecified: Secondary | ICD-10-CM | POA: Diagnosis not present

## 2013-12-12 DIAGNOSIS — Z5181 Encounter for therapeutic drug level monitoring: Secondary | ICD-10-CM

## 2013-12-12 DIAGNOSIS — I6350 Cerebral infarction due to unspecified occlusion or stenosis of unspecified cerebral artery: Secondary | ICD-10-CM

## 2013-12-12 DIAGNOSIS — H35059 Retinal neovascularization, unspecified, unspecified eye: Secondary | ICD-10-CM | POA: Diagnosis not present

## 2013-12-12 DIAGNOSIS — H353290 Exudative age-related macular degeneration, unspecified eye, stage unspecified: Secondary | ICD-10-CM | POA: Diagnosis not present

## 2013-12-12 LAB — POCT INR: INR: 2.5

## 2014-01-07 ENCOUNTER — Ambulatory Visit (INDEPENDENT_AMBULATORY_CARE_PROVIDER_SITE_OTHER): Payer: Medicare Other

## 2014-01-07 DIAGNOSIS — I359 Nonrheumatic aortic valve disorder, unspecified: Secondary | ICD-10-CM

## 2014-01-07 DIAGNOSIS — Z954 Presence of other heart-valve replacement: Secondary | ICD-10-CM

## 2014-01-07 DIAGNOSIS — Z5181 Encounter for therapeutic drug level monitoring: Secondary | ICD-10-CM

## 2014-01-07 DIAGNOSIS — I6350 Cerebral infarction due to unspecified occlusion or stenosis of unspecified cerebral artery: Secondary | ICD-10-CM | POA: Diagnosis not present

## 2014-01-07 LAB — POCT INR: INR: 2.8

## 2014-01-09 ENCOUNTER — Other Ambulatory Visit: Payer: Self-pay | Admitting: Cardiology

## 2014-01-13 ENCOUNTER — Other Ambulatory Visit: Payer: Self-pay

## 2014-01-13 MED ORDER — DONEPEZIL HCL 10 MG PO TABS: 10.0000 mg | ORAL_TABLET | Freq: Every day | ORAL | Status: DC

## 2014-01-14 DIAGNOSIS — G609 Hereditary and idiopathic neuropathy, unspecified: Secondary | ICD-10-CM | POA: Diagnosis not present

## 2014-01-22 ENCOUNTER — Other Ambulatory Visit: Payer: Self-pay | Admitting: Cardiology

## 2014-01-23 ENCOUNTER — Other Ambulatory Visit: Payer: Self-pay | Admitting: Pulmonary Disease

## 2014-01-23 DIAGNOSIS — N6019 Diffuse cystic mastopathy of unspecified breast: Secondary | ICD-10-CM

## 2014-01-29 ENCOUNTER — Ambulatory Visit
Admission: RE | Admit: 2014-01-29 | Discharge: 2014-01-29 | Disposition: A | Discharge status: Home / Self Care | Payer: Medicare Other | Source: Ambulatory Visit | Attending: Pulmonary Disease | Admitting: Pulmonary Disease

## 2014-01-29 ENCOUNTER — Other Ambulatory Visit: Payer: Self-pay | Admitting: Pulmonary Disease

## 2014-01-29 ENCOUNTER — Other Ambulatory Visit (HOSPITAL_COMMUNITY): Payer: Self-pay | Admitting: Diagnostic Radiology

## 2014-01-29 DIAGNOSIS — R222 Localized swelling, mass and lump, trunk: Secondary | ICD-10-CM

## 2014-01-29 DIAGNOSIS — L723 Sebaceous cyst: Secondary | ICD-10-CM | POA: Diagnosis not present

## 2014-01-29 DIAGNOSIS — N6019 Diffuse cystic mastopathy of unspecified breast: Secondary | ICD-10-CM

## 2014-01-29 DIAGNOSIS — N6011 Diffuse cystic mastopathy of right breast: Secondary | ICD-10-CM

## 2014-01-29 DIAGNOSIS — N6489 Other specified disorders of breast: Secondary | ICD-10-CM | POA: Diagnosis not present

## 2014-02-06 DIAGNOSIS — Z7901 Long term (current) use of anticoagulants: Secondary | ICD-10-CM | POA: Diagnosis not present

## 2014-02-06 DIAGNOSIS — I10 Essential (primary) hypertension: Secondary | ICD-10-CM | POA: Diagnosis not present

## 2014-02-06 DIAGNOSIS — J449 Chronic obstructive pulmonary disease, unspecified: Secondary | ICD-10-CM | POA: Diagnosis not present

## 2014-02-06 DIAGNOSIS — J869 Pyothorax without fistula: Secondary | ICD-10-CM | POA: Diagnosis not present

## 2014-02-07 ENCOUNTER — Ambulatory Visit: Payer: Medicare Other | Admitting: Cardiology

## 2014-02-07 ENCOUNTER — Ambulatory Visit (INDEPENDENT_AMBULATORY_CARE_PROVIDER_SITE_OTHER): Payer: Medicare Other | Admitting: Pharmacist

## 2014-02-07 DIAGNOSIS — I359 Nonrheumatic aortic valve disorder, unspecified: Secondary | ICD-10-CM | POA: Diagnosis not present

## 2014-02-07 DIAGNOSIS — Z954 Presence of other heart-valve replacement: Secondary | ICD-10-CM | POA: Diagnosis not present

## 2014-02-07 DIAGNOSIS — Z5181 Encounter for therapeutic drug level monitoring: Secondary | ICD-10-CM

## 2014-02-07 DIAGNOSIS — I6350 Cerebral infarction due to unspecified occlusion or stenosis of unspecified cerebral artery: Secondary | ICD-10-CM | POA: Diagnosis not present

## 2014-02-07 LAB — POCT INR: INR: 2

## 2014-02-13 ENCOUNTER — Other Ambulatory Visit: Payer: Self-pay | Admitting: Cardiology

## 2014-02-15 ENCOUNTER — Ambulatory Visit (INDEPENDENT_AMBULATORY_CARE_PROVIDER_SITE_OTHER): Payer: Medicare Other | Admitting: Pharmacist

## 2014-02-15 DIAGNOSIS — Z5181 Encounter for therapeutic drug level monitoring: Secondary | ICD-10-CM

## 2014-02-15 DIAGNOSIS — Z954 Presence of other heart-valve replacement: Secondary | ICD-10-CM

## 2014-02-15 DIAGNOSIS — I359 Nonrheumatic aortic valve disorder, unspecified: Secondary | ICD-10-CM

## 2014-02-15 DIAGNOSIS — I6350 Cerebral infarction due to unspecified occlusion or stenosis of unspecified cerebral artery: Secondary | ICD-10-CM

## 2014-02-15 LAB — POCT INR: INR: 2.6

## 2014-02-28 ENCOUNTER — Ambulatory Visit (INDEPENDENT_AMBULATORY_CARE_PROVIDER_SITE_OTHER): Payer: Medicare Other | Admitting: *Deleted

## 2014-02-28 DIAGNOSIS — I6350 Cerebral infarction due to unspecified occlusion or stenosis of unspecified cerebral artery: Secondary | ICD-10-CM

## 2014-02-28 DIAGNOSIS — Z5181 Encounter for therapeutic drug level monitoring: Secondary | ICD-10-CM | POA: Diagnosis not present

## 2014-02-28 DIAGNOSIS — I359 Nonrheumatic aortic valve disorder, unspecified: Secondary | ICD-10-CM

## 2014-02-28 DIAGNOSIS — Z954 Presence of other heart-valve replacement: Secondary | ICD-10-CM | POA: Diagnosis not present

## 2014-02-28 LAB — POCT INR: INR: 1.9

## 2014-03-01 ENCOUNTER — Encounter: Payer: Self-pay | Admitting: Diagnostic Neuroimaging

## 2014-03-13 DIAGNOSIS — H353290 Exudative age-related macular degeneration, unspecified eye, stage unspecified: Secondary | ICD-10-CM | POA: Diagnosis not present

## 2014-03-13 DIAGNOSIS — H35059 Retinal neovascularization, unspecified, unspecified eye: Secondary | ICD-10-CM | POA: Diagnosis not present

## 2014-03-14 ENCOUNTER — Ambulatory Visit (INDEPENDENT_AMBULATORY_CARE_PROVIDER_SITE_OTHER): Payer: Medicare Other | Admitting: Cardiology

## 2014-03-14 ENCOUNTER — Ambulatory Visit (INDEPENDENT_AMBULATORY_CARE_PROVIDER_SITE_OTHER): Payer: Medicare Other | Admitting: Pharmacist

## 2014-03-14 ENCOUNTER — Encounter: Payer: Self-pay | Admitting: *Deleted

## 2014-03-14 ENCOUNTER — Encounter: Payer: Self-pay | Admitting: Cardiology

## 2014-03-14 VITALS — BP 129/48 | HR 48 | Ht 63.0 in | Wt 108.8 lb

## 2014-03-14 DIAGNOSIS — Z954 Presence of other heart-valve replacement: Secondary | ICD-10-CM

## 2014-03-14 DIAGNOSIS — I679 Cerebrovascular disease, unspecified: Secondary | ICD-10-CM

## 2014-03-14 DIAGNOSIS — I10 Essential (primary) hypertension: Secondary | ICD-10-CM

## 2014-03-14 DIAGNOSIS — I25810 Atherosclerosis of coronary artery bypass graft(s) without angina pectoris: Secondary | ICD-10-CM

## 2014-03-14 DIAGNOSIS — I6350 Cerebral infarction due to unspecified occlusion or stenosis of unspecified cerebral artery: Secondary | ICD-10-CM | POA: Diagnosis not present

## 2014-03-14 DIAGNOSIS — Z5181 Encounter for therapeutic drug level monitoring: Secondary | ICD-10-CM

## 2014-03-14 DIAGNOSIS — E785 Hyperlipidemia, unspecified: Secondary | ICD-10-CM

## 2014-03-14 DIAGNOSIS — I359 Nonrheumatic aortic valve disorder, unspecified: Secondary | ICD-10-CM

## 2014-03-14 DIAGNOSIS — I6529 Occlusion and stenosis of unspecified carotid artery: Secondary | ICD-10-CM

## 2014-03-14 LAB — BASIC METABOLIC PANEL
BUN: 16 mg/dL (ref 6–23)
CALCIUM: 9.2 mg/dL (ref 8.4–10.5)
CHLORIDE: 103 meq/L (ref 96–112)
CO2: 31 mEq/L (ref 19–32)
Creatinine, Ser: 1 mg/dL (ref 0.4–1.2)
GFR: 58.66 mL/min — ABNORMAL LOW (ref >60.00)
Glucose, Bld: 89 mg/dL (ref 70–99)
Potassium: 3.9 mEq/L (ref 3.5–5.1)
Sodium: 140 mEq/L (ref 135–145)

## 2014-03-14 LAB — CBC WITH DIFFERENTIAL/PLATELET
BASOS PCT: 0.4 % (ref 0.0–3.0)
Basophils Absolute: 0 10*3/uL (ref 0.0–0.1)
Eosinophils Absolute: 0.1 10*3/uL (ref 0.0–0.7)
Eosinophils Relative: 1.1 % (ref 0.0–5.0)
HCT: 32.7 % — ABNORMAL LOW (ref 36.0–46.0)
HEMOGLOBIN: 10.9 g/dL — AB (ref 12.0–15.0)
LYMPHS PCT: 22.8 % (ref 12.0–46.0)
Lymphs Abs: 1.9 10*3/uL (ref 0.7–4.0)
MCHC: 33.2 g/dL (ref 30.0–36.0)
MCV: 94 fl (ref 78.0–100.0)
MONOS PCT: 7.9 % (ref 3.0–12.0)
Monocytes Absolute: 0.7 10*3/uL (ref 0.1–1.0)
Neutro Abs: 5.7 10*3/uL (ref 1.4–7.7)
Neutrophils Relative %: 67.8 % (ref 43.0–77.0)
Platelets: 216 10*3/uL (ref 150.0–400.0)
RBC: 3.48 Mil/uL — AB (ref 3.87–5.11)
RDW: 14.1 % (ref 11.5–14.6)
WBC: 8.4 10*3/uL (ref 4.5–10.5)

## 2014-03-14 LAB — HEPATIC FUNCTION PANEL
ALBUMIN: 3.9 g/dL (ref 3.5–5.2)
ALT: 19 U/L (ref 0–35)
AST: 32 U/L (ref 0–37)
Alkaline Phosphatase: 68 U/L (ref 39–117)
Bilirubin, Direct: 0 mg/dL (ref 0.0–0.3)
Total Bilirubin: 0.7 mg/dL (ref 0.3–1.2)
Total Protein: 6.6 g/dL (ref 6.0–8.3)

## 2014-03-14 LAB — POCT INR: INR: 1.4

## 2014-03-14 MED ORDER — METOPROLOL TARTRATE 25 MG PO TABS: 12.5000 mg | ORAL_TABLET | Freq: Two times a day (BID) | ORAL | Status: DC

## 2014-03-14 NOTE — Assessment & Plan Note (Signed)
Continue statin. Check liver functions. 

## 2014-03-14 NOTE — Assessment & Plan Note (Signed)
Continue statin. Recheck carotid Dopplers July 2015.

## 2014-03-14 NOTE — Assessment & Plan Note (Signed)
Heart rate is running low.Change metoprolol to 12.5 mg by mouth twice a day. Continue remaining medications. Follow blood pressure at home.

## 2014-03-14 NOTE — Patient Instructions (Signed)
Your physician wants you to follow-up in: 6 MONTHS WITH DR Jens SomRENSHAW You will receive a reminder letter in the mail two months in advance. If you don't receive a letter, please call our office to schedule the follow-up appointment.   CHANGE METOPROLOL TO 25 MG 1/2 TABLET TWICE DAILY  Your physician recommends that you HAVE LAB WORK TODAY  Your physician has requested that you have a carotid duplex. This test is an ultrasound of the carotid arteries in your neck. It looks at blood flow through these arteries that supply the brain with blood. Allow one hour for this exam. There are no restrictions or special instructions.DUE [place]

## 2014-03-14 NOTE — Assessment & Plan Note (Signed)
Continue SBE prophylaxis. Perivalvular aortic insufficiency is mild on most recent echo. Check hemoglobin, potassium, renal function.

## 2014-03-14 NOTE — Assessment & Plan Note (Signed)
Continue statin. 

## 2014-03-14 NOTE — Progress Notes (Signed)
HPI: FU coronary artery disease (s/p CABG), prior aortic valve replacement and aortic insufficiency. Last cardiac catheterization in 2011 revealed normal left main; 30% first diagonal, 50% LAD and 60% second diagonal. There was a 40-50% circumflex and a 40% right coronary artery. All grafts were occluded. Medical therapy recommended. Transesophageal echocardiogram in February of 2012 showed an ejection fraction of 45%. There was a St. Jude aortic valve with moderate perivalvular aortic insufficiency. There was moderate mitral regurgitation, moderate left atrial enlargement, mild right atrial enlargement and mild tricuspid regurgitation. Carotid Dopplers in July of 2014 showed 40-59% bilateral stenosis and followup recommended in one year. Echocardiogram September 2014 showed normal LV function, grade 1 diastolic dysfunction, mechanical aortic valve with mild. Valvular aortic insufficiency, mean gradient 24 mm of mercury. There was mild mitral regurgitation and mild to moderate left atrial enlargement. Pulmonary pressures moderately elevated. She was last seen in Sept of 2014. Since then, she has some dyspnea on exertion but no orthopnea, PND, pedal edema, syncope or chest pain.   Current Outpatient Prescriptions  Medication Sig Dispense Refill  . amLODipine-benazepril (LOTREL) 5-20 MG per capsule TAKE ONE CAPSULE DAILY.  30 capsule  6  . aspirin 81 MG EC tablet Take 81 mg by mouth daily.        Marland Kitchen. atorvastatin (LIPITOR) 40 MG tablet TAKE ONE TABLET DAILY.  30 tablet  3  . donepezil (ARICEPT) 10 MG tablet Take 1 tablet (10 mg total) by mouth daily.  90 tablet  1  . ergocalciferol (VITAMIN D2) 50000 UNITS capsule Take 50,000 Units by mouth once a week.       . furosemide (LASIX) 20 MG tablet TAKE ONE TABLET BY MOUTH ONCE DAILY.  30 tablet  1  . Melatonin 3 MG TABS Take 3 mg by mouth at bedtime.        . metoprolol (LOPRESSOR) 50 MG tablet Take 0.5 tablets (25 mg total) by mouth 2 (two) times  daily.  30 tablet  1  . Multiple Vitamins-Minerals (VITEYES AREDS FORMULA PO) Take 1 tablet by mouth daily.      . potassium chloride (K-DUR,KLOR-CON) 10 MEQ tablet TAKE ONE TABLET DAILY.  30 tablet  0  . pregabalin (LYRICA) 75 MG capsule Take 75 mg by mouth every other day. At bedtime       . traMADol (ULTRAM) 50 MG tablet as needed.      . traZODone (DESYREL) 50 MG tablet Take 1 tablet by mouth at bedtime as needed.      . warfarin (COUMADIN) 5 MG tablet Take as directed by coumadin clinic  35 tablet  3   No current facility-administered medications for this visit.     Past Medical History  Diagnosis Date  . Carotid artery stenosis     Without infarction  . Stroke   . Hypertension     Unspecified  . Coronary artery disease   . Hypercholesterolemia     Pure  . LBBB (left bundle branch block)   . Macular degeneration (senile) of retina, unspecified   . Unspecified vitamin D deficiency   . Postsurgical aortocoronary bypass status   . Unspecified hereditary and idiopathic peripheral neuropathy   . Transient global amnesia   . Heart valve replaced by other means     Past Surgical History  Procedure Laterality Date  . Aortic valve replacement    . Coronary artery bypass graft    . Appendectomy    . Tonsillectomy  History   Social History  . Marital Status: Widowed    Spouse Name: N/A    Number of Children: 2  . Years of Education: 12th   Occupational History  .     Social History Main Topics  . Smoking status: Former Smoker -- 0.50 packs/day    Types: Cigarettes    Quit date: 11/30/1991  . Smokeless tobacco: Never Used     Comment: Tobacco use-no  . Alcohol Use: No  . Drug Use: No  . Sexual Activity: No   Other Topics Concern  . Not on file   Social History Narrative   Pt lives at home alone.   Caffeine Use: very rarely    ROS: no fevers or chills, productive cough, hemoptysis, dysphasia, odynophagia, melena, hematochezia, dysuria, hematuria, rash,  seizure activity, orthopnea, PND, pedal edema, claudication. Remaining systems are negative.  Physical Exam: Well-developed well-nourished in no acute distress.  Skin is warm and dry.  HEENT is normal.  Neck is supple.  Chest is clear to auscultation with normal expansion.  Cardiovascular exam is regular rate and rhythm. Crisp mechanical valve sound. 2/6 systolic and diastolic murmur. Abdominal exam nontender or distended. No masses palpated. Extremities show no edema. neuro grossly intact  ECG Sinus rhythm at a rate of 48. Left bundle branch block.

## 2014-03-15 ENCOUNTER — Telehealth: Payer: Self-pay | Admitting: Cardiology

## 2014-03-15 NOTE — Telephone Encounter (Signed)
Lisbeth PlyDonna Carter pt's daughter aware of lab results and Md's recommendations. A copy of labs mailed to daughter.

## 2014-03-15 NOTE — Telephone Encounter (Signed)
Pt's daughter called in and she would like the results of her mothers blood work. Please call and advise.

## 2014-03-18 ENCOUNTER — Telehealth: Payer: Self-pay | Admitting: Cardiology

## 2014-03-18 ENCOUNTER — Other Ambulatory Visit: Payer: Self-pay | Admitting: Cardiology

## 2014-03-18 NOTE — Telephone Encounter (Signed)
New message    Daughter calling  Regarding test results .   Can lab result be sent  Over to Dr. [place]   Office phone number [phone].

## 2014-03-18 NOTE — Telephone Encounter (Signed)
Spoke with pt, confirmed fax number to dr Juanetta Goslinghawkins and re-faxed the lab work.

## 2014-03-21 ENCOUNTER — Ambulatory Visit (INDEPENDENT_AMBULATORY_CARE_PROVIDER_SITE_OTHER): Payer: Medicare Other | Admitting: *Deleted

## 2014-03-21 DIAGNOSIS — D649 Anemia, unspecified: Secondary | ICD-10-CM | POA: Diagnosis not present

## 2014-03-21 DIAGNOSIS — I359 Nonrheumatic aortic valve disorder, unspecified: Secondary | ICD-10-CM

## 2014-03-21 DIAGNOSIS — Z954 Presence of other heart-valve replacement: Secondary | ICD-10-CM

## 2014-03-21 DIAGNOSIS — Z79899 Other long term (current) drug therapy: Secondary | ICD-10-CM | POA: Diagnosis not present

## 2014-03-21 DIAGNOSIS — Z5181 Encounter for therapeutic drug level monitoring: Secondary | ICD-10-CM | POA: Diagnosis not present

## 2014-03-21 DIAGNOSIS — I6350 Cerebral infarction due to unspecified occlusion or stenosis of unspecified cerebral artery: Secondary | ICD-10-CM | POA: Diagnosis not present

## 2014-03-21 LAB — POCT INR: INR: 2.3

## 2014-03-22 DIAGNOSIS — Z1211 Encounter for screening for malignant neoplasm of colon: Secondary | ICD-10-CM | POA: Diagnosis not present

## 2014-03-25 ENCOUNTER — Other Ambulatory Visit: Payer: Self-pay | Admitting: Cardiology

## 2014-04-04 ENCOUNTER — Ambulatory Visit (INDEPENDENT_AMBULATORY_CARE_PROVIDER_SITE_OTHER): Payer: Medicare Other

## 2014-04-04 DIAGNOSIS — I359 Nonrheumatic aortic valve disorder, unspecified: Secondary | ICD-10-CM | POA: Diagnosis not present

## 2014-04-04 DIAGNOSIS — Z5181 Encounter for therapeutic drug level monitoring: Secondary | ICD-10-CM | POA: Diagnosis not present

## 2014-04-04 DIAGNOSIS — Z954 Presence of other heart-valve replacement: Secondary | ICD-10-CM | POA: Diagnosis not present

## 2014-04-04 DIAGNOSIS — I6350 Cerebral infarction due to unspecified occlusion or stenosis of unspecified cerebral artery: Secondary | ICD-10-CM | POA: Diagnosis not present

## 2014-04-04 LAB — POCT INR: INR: 2.3

## 2014-04-15 ENCOUNTER — Ambulatory Visit: Payer: Medicare Other | Admitting: Diagnostic Neuroimaging

## 2014-04-15 ENCOUNTER — Other Ambulatory Visit: Payer: Self-pay | Admitting: Cardiology

## 2014-04-16 ENCOUNTER — Ambulatory Visit: Payer: Medicare Other | Admitting: Diagnostic Neuroimaging

## 2014-04-24 DIAGNOSIS — H353290 Exudative age-related macular degeneration, unspecified eye, stage unspecified: Secondary | ICD-10-CM | POA: Diagnosis not present

## 2014-04-24 DIAGNOSIS — H35059 Retinal neovascularization, unspecified, unspecified eye: Secondary | ICD-10-CM | POA: Diagnosis not present

## 2014-04-25 ENCOUNTER — Ambulatory Visit (INDEPENDENT_AMBULATORY_CARE_PROVIDER_SITE_OTHER): Payer: Medicare Other | Admitting: *Deleted

## 2014-04-25 DIAGNOSIS — Z954 Presence of other heart-valve replacement: Secondary | ICD-10-CM | POA: Diagnosis not present

## 2014-04-25 DIAGNOSIS — I6350 Cerebral infarction due to unspecified occlusion or stenosis of unspecified cerebral artery: Secondary | ICD-10-CM

## 2014-04-25 DIAGNOSIS — Z5181 Encounter for therapeutic drug level monitoring: Secondary | ICD-10-CM

## 2014-04-25 DIAGNOSIS — I359 Nonrheumatic aortic valve disorder, unspecified: Secondary | ICD-10-CM

## 2014-04-25 LAB — POCT INR: INR: 2.1

## 2014-05-09 ENCOUNTER — Ambulatory Visit (INDEPENDENT_AMBULATORY_CARE_PROVIDER_SITE_OTHER): Payer: Medicare Other

## 2014-05-09 DIAGNOSIS — Z5181 Encounter for therapeutic drug level monitoring: Secondary | ICD-10-CM

## 2014-05-09 DIAGNOSIS — Z954 Presence of other heart-valve replacement: Secondary | ICD-10-CM

## 2014-05-09 DIAGNOSIS — I359 Nonrheumatic aortic valve disorder, unspecified: Secondary | ICD-10-CM

## 2014-05-09 DIAGNOSIS — I6350 Cerebral infarction due to unspecified occlusion or stenosis of unspecified cerebral artery: Secondary | ICD-10-CM | POA: Diagnosis not present

## 2014-05-09 LAB — POCT INR: INR: 2.7

## 2014-05-14 ENCOUNTER — Ambulatory Visit (INDEPENDENT_AMBULATORY_CARE_PROVIDER_SITE_OTHER): Payer: Medicare Other | Admitting: Diagnostic Neuroimaging

## 2014-05-14 ENCOUNTER — Encounter: Payer: Self-pay | Admitting: Diagnostic Neuroimaging

## 2014-05-14 VITALS — BP 133/43 | HR 52 | Ht 62.5 in | Wt 108.0 lb

## 2014-05-14 DIAGNOSIS — R413 Other amnesia: Secondary | ICD-10-CM | POA: Diagnosis not present

## 2014-05-14 DIAGNOSIS — I25810 Atherosclerosis of coronary artery bypass graft(s) without angina pectoris: Secondary | ICD-10-CM | POA: Diagnosis not present

## 2014-05-14 DIAGNOSIS — F29 Unspecified psychosis not due to a substance or known physiological condition: Secondary | ICD-10-CM | POA: Diagnosis not present

## 2014-05-14 DIAGNOSIS — R410 Disorientation, unspecified: Secondary | ICD-10-CM

## 2014-05-14 NOTE — Progress Notes (Signed)
[facility]  PATIENT: Colleen CablesRuth E Simpson DOB: 1926/02/03  REFERRING CLINICIAN:  HISTORY FROM: patient and daughter REASON FOR VISIT: follow up   HISTORICAL  CHIEF COMPLAINT:  Chief Complaint  Patient presents with  . Follow-up    HISTORY OF PRESENT ILLNESS:   UPDATE 05/14/14: Since last visit, symptoms are stable. Only 2 mild episode in the last year. Did not seek medical attention. Episodes were milder this year than in the past.   UPDATE 04/16/13: Since last visit, patient had one more episode of confusion, mild on 01/23/13. Patient does not recall this. Daughter and her friend noticed she was acting confused, with mild recall problems and being quiet. Episode lasted 24-48 hours. Patient continues to live on her own. Daughter lives 8 miles away. Patient continues to drive a car to the gym and grocery store.  PRIOR HPI (10/17/12): 80 year old ambidextrous white widowed female who lives alone with a history of  about 20 episodes of confusion thought to represent transient global amnesia  beginning June 2008 through 8/22013. Each episode lasts about 24 hours.  As in the past her confusion usually clears after she wakes from going to sleep. The episodes of confusion or transient global amnesia are sometimes preceded by headache. This headache is bifrontal. She has no other neurologic symptoms.She had an extensive evaluation including EEGs x2, CT scans x2 ,B12, RPR, and TSH.Her B12 was borderline low at 295 and a serum methylmalonic acid 07/24/2009 was 286, in the normal range. She is independent in her activities of daily living and does drive a car which I have asked her not to do so. Her daughter calls her before she leaves the home. Her daughter says that she gets real quiet after  an episode and that the patient feels "sleepy" and  when she is coming out of it . She repeats questions over and over. The patient goes 3 days per week to the gym and exercises by walking. She  has had cardiac evaluation by Dr. Daleen SquibbWall. 10/22/11= MMSE 28/30, missing calculations. CDT 4/4. AFT 12. Myrtis SerKatz Index of  independence in activities of daily living 6. Lawton-Brody Instrumental activities of daily living scale 8. Neuropsychiatric inventory= zero. Geriatric depression scale 1/15. Falls assessment tool score is 8. She had one episode 11/28/11. 04/18/12= MMSE 25/30. Clock drawing task 4/4. Animal fluency test 11.She had another episode A./22/201 3. She was eating in a restaurant after going to the gym. She went to sleep afterwards for 24 hours.11/13/2012= MMSE 28/30.Clock drawing task 4/4. Animal fluency test 13. She denies other neurologic symptoms   REVIEW OF SYSTEMS: Full 14 system review of systems performed and notable only for hearing loss joint pain memory loss confusion numbness.  ALLERGIES: Allergies  Allergen Reactions  . Codeine   . Penicillins     HOME MEDICATIONS: Outpatient Prescriptions Prior to Visit  Medication Sig Dispense Refill  . amLODipine-benazepril (LOTREL) 5-20 MG per capsule TAKE ONE CAPSULE DAILY.  30 capsule  11  . aspirin 81 MG EC tablet Take 81 mg by mouth daily.        Marland Kitchen. atorvastatin (LIPITOR) 40 MG tablet TAKE ONE TABLET DAILY.  30 tablet  2  . donepezil (ARICEPT) 10 MG tablet Take 1 tablet (10 mg total) by mouth daily.  90 tablet  1  . ergocalciferol (VITAMIN D2) 50000 UNITS capsule Take 50,000 Units by mouth once a week.       . furosemide (LASIX) 20 MG tablet TAKE ONE TABLET  BY MOUTH ONCE DAILY.  30 tablet  11  . Melatonin 3 MG TABS Take 3 mg by mouth at bedtime.        . metoprolol (LOPRESSOR) 25 MG tablet Take 0.5 tablets (12.5 mg total) by mouth 2 (two) times daily.  30 tablet  12  . Multiple Vitamins-Minerals (VITEYES AREDS FORMULA PO) Take 1 tablet by mouth daily.      . potassium chloride (K-DUR,KLOR-CON) 10 MEQ tablet TAKE ONE TABLET DAILY.  30 tablet  5  . pregabalin (LYRICA) 75 MG capsule Take 75 mg by mouth every other day. At bedtime         . traMADol (ULTRAM) 50 MG tablet as needed.      . traZODone (DESYREL) 50 MG tablet Take 1 tablet by mouth at bedtime as needed.      . warfarin (COUMADIN) 5 MG tablet Take as directed by coumadin clinic  35 tablet  3   No facility-administered medications prior to visit.    PAST MEDICAL HISTORY: Past Medical History  Diagnosis Date  . Carotid artery stenosis     Without infarction  . Stroke   . Hypertension     Unspecified  . Coronary artery disease   . Hypercholesterolemia     Pure  . LBBB (left bundle branch block)   . Macular degeneration (senile) of retina, unspecified   . Unspecified vitamin D deficiency   . Postsurgical aortocoronary bypass status   . Unspecified hereditary and idiopathic peripheral neuropathy   . Transient global amnesia   . Heart valve replaced by other means     PAST SURGICAL HISTORY: Past Surgical History  Procedure Laterality Date  . Aortic valve replacement    . Coronary artery bypass graft    . Appendectomy    . Tonsillectomy      FAMILY HISTORY: Family History  Problem Relation Age of Onset  . Heart attack Father   . Stroke Sister   . Neuropathy Brother   . COPD Sister     SOCIAL HISTORY:  History   Social History  . Marital Status: Widowed    Spouse Name: N/A    Number of Children: 2  . Years of Education: 12th   Occupational History  .     Social History Main Topics  . Smoking status: Former Smoker -- 0.50 packs/day    Types: Cigarettes    Quit date: 11/30/1991  . Smokeless tobacco: Never Used     Comment: Tobacco use-no  . Alcohol Use: No  . Drug Use: No  . Sexual Activity: No   Other Topics Concern  . Not on file   Social History Narrative   Pt lives at home alone.   Caffeine Use: very rarely     PHYSICAL EXAM  Filed Vitals:   05/14/14 1131  BP: 133/43  Pulse: 52  Height: 5' 2.5" (1.588 m)  Weight: 108 lb (48.988 kg)    Not recorded    Body mass index is 19.43 kg/(m^2).  GENERAL  EXAM: Patient is in no distress  CARDIOVASCULAR: Regular rate and rhythm, no murmurs, no carotid bruits  NEUROLOGIC: MENTAL STATUS: awake, alert, language fluent, comprehension intact, naming intact; NEG SNOUT CRANIAL NERVE: no papilledema on fundoscopic exam, pupils equal and reactive to light, visual fields full to confrontation, extraocular muscles intact, no nystagmus, facial sensation and strength symmetric, uvula midline, shoulder shrug symmetric, tongue midline. MOTOR: normal bulk and tone, full strength in the BUE, BLE SENSORY: normal and symmetric  to light touch COORDINATION: finger-nose-finger, fine finger movements normal REFLEXES: deep tendon reflexes present and symmetric GAIT/STATION: narrow based gait; romberg is negative   DIAGNOSTIC DATA (LABS, IMAGING, TESTING) - I reviewed patient records, labs, notes, testing and imaging myself where available.  Lab Results  Component Value Date   WBC 8.4 03/14/2014   HGB 10.9* 03/14/2014   HCT 32.7* 03/14/2014   MCV 94.0 03/14/2014   PLT 216.0 03/14/2014      Component Value Date/Time   NA 140 03/14/2014 1200   K 3.9 03/14/2014 1200   CL 103 03/14/2014 1200   CO2 31 03/14/2014 1200   GLUCOSE 89 03/14/2014 1200   BUN 16 03/14/2014 1200   CREATININE 1.0 03/14/2014 1200   CALCIUM 9.2 03/14/2014 1200   PROT 6.6 03/14/2014 1200   ALBUMIN 3.9 03/14/2014 1200   AST 32 03/14/2014 1200   ALT 19 03/14/2014 1200   ALKPHOS 68 03/14/2014 1200   BILITOT 0.7 03/14/2014 1200   GFRNONAA 58* 11/29/2012 0537   GFRAA 67* 11/29/2012 0537   Lab Results  Component Value Date   CHOL 157 08/06/2013   HDL 65.40 08/06/2013   LDLCALC 73 08/06/2013   TRIG 92.0 08/06/2013   CHOLHDL 2 08/06/2013   No results found for this basename: HGBA1C   No results found for this basename: VITAMINB12   No results found for this basename: TSH    12/27/08 CT head - mild chronic small vessel ischemic disease and mild atrophy   ASSESSMENT AND PLAN  80 y.o. year old  female  has a past medical history of Carotid artery stenosis; Stroke; Hypertension; Coronary artery disease; Hypercholesterolemia; LBBB (left bundle branch block); Macular degeneration (senile) of retina, unspecified; Unspecified vitamin D deficiency; Postsurgical aortocoronary bypass status; Unspecified hereditary and idiopathic peripheral neuropathy; Transient global amnesia; and Heart valve replaced by other means. here with intermittent episodes of confusion, memory loss. Previously dx'd with recurrent transient global amnesia, multiple events (10-15) since 2008. Has had extensive testing in the past. No further neurologic testing advised.  Ddx: TGA, TIA, vascular dementia, cardiogenic (cerebral hypoperfusion)  PLAN: 1. Continue medical mgmt 2. Should transition away from driving, due to unpredictable spells of confusion; this was recommended by Dr. Sandria Manly in the past as well (since 2009) 3. Follow up as needed  Return for return to PCP.    Suanne Marker, MD 05/14/2014, 1:06 PM Certified in Neurology, Neurophysiology and Neuroimaging  [facility] [address], [address] [place], [place] [address] [phone]

## 2014-05-14 NOTE — Patient Instructions (Signed)
Continue current medications. 

## 2014-05-14 NOTE — Telephone Encounter (Signed)
Closing encounter

## 2014-05-15 DIAGNOSIS — I4891 Unspecified atrial fibrillation: Secondary | ICD-10-CM | POA: Diagnosis not present

## 2014-05-15 DIAGNOSIS — M1990 Unspecified osteoarthritis, unspecified site: Secondary | ICD-10-CM | POA: Diagnosis not present

## 2014-05-15 DIAGNOSIS — I10 Essential (primary) hypertension: Secondary | ICD-10-CM | POA: Diagnosis not present

## 2014-05-15 DIAGNOSIS — J449 Chronic obstructive pulmonary disease, unspecified: Secondary | ICD-10-CM | POA: Diagnosis not present

## 2014-05-30 ENCOUNTER — Ambulatory Visit (INDEPENDENT_AMBULATORY_CARE_PROVIDER_SITE_OTHER): Payer: Medicare Other

## 2014-05-30 DIAGNOSIS — I6350 Cerebral infarction due to unspecified occlusion or stenosis of unspecified cerebral artery: Secondary | ICD-10-CM

## 2014-05-30 DIAGNOSIS — Z954 Presence of other heart-valve replacement: Secondary | ICD-10-CM

## 2014-05-30 DIAGNOSIS — Z5181 Encounter for therapeutic drug level monitoring: Secondary | ICD-10-CM | POA: Diagnosis not present

## 2014-05-30 DIAGNOSIS — I359 Nonrheumatic aortic valve disorder, unspecified: Secondary | ICD-10-CM

## 2014-05-30 LAB — POCT INR: INR: 2.4

## 2014-06-10 DIAGNOSIS — H35059 Retinal neovascularization, unspecified, unspecified eye: Secondary | ICD-10-CM | POA: Diagnosis not present

## 2014-06-10 DIAGNOSIS — H353290 Exudative age-related macular degeneration, unspecified eye, stage unspecified: Secondary | ICD-10-CM | POA: Diagnosis not present

## 2014-06-11 ENCOUNTER — Ambulatory Visit (HOSPITAL_COMMUNITY): Payer: Medicare Other | Attending: Cardiology | Admitting: Cardiology

## 2014-06-11 DIAGNOSIS — E785 Hyperlipidemia, unspecified: Secondary | ICD-10-CM | POA: Diagnosis not present

## 2014-06-11 DIAGNOSIS — I679 Cerebrovascular disease, unspecified: Secondary | ICD-10-CM

## 2014-06-11 DIAGNOSIS — I658 Occlusion and stenosis of other precerebral arteries: Secondary | ICD-10-CM | POA: Diagnosis not present

## 2014-06-11 DIAGNOSIS — I10 Essential (primary) hypertension: Secondary | ICD-10-CM | POA: Diagnosis not present

## 2014-06-11 DIAGNOSIS — I2510 Atherosclerotic heart disease of native coronary artery without angina pectoris: Secondary | ICD-10-CM | POA: Insufficient documentation

## 2014-06-11 DIAGNOSIS — I6529 Occlusion and stenosis of unspecified carotid artery: Secondary | ICD-10-CM | POA: Insufficient documentation

## 2014-06-11 DIAGNOSIS — Z8673 Personal history of transient ischemic attack (TIA), and cerebral infarction without residual deficits: Secondary | ICD-10-CM | POA: Diagnosis not present

## 2014-06-11 DIAGNOSIS — Z951 Presence of aortocoronary bypass graft: Secondary | ICD-10-CM | POA: Insufficient documentation

## 2014-06-11 NOTE — Progress Notes (Signed)
Carotid duplex performed 

## 2014-06-21 ENCOUNTER — Other Ambulatory Visit: Payer: Self-pay | Admitting: Diagnostic Neuroimaging

## 2014-06-27 ENCOUNTER — Ambulatory Visit (INDEPENDENT_AMBULATORY_CARE_PROVIDER_SITE_OTHER): Payer: Medicare Other | Admitting: *Deleted

## 2014-06-27 ENCOUNTER — Other Ambulatory Visit: Payer: Self-pay | Admitting: Pulmonary Disease

## 2014-06-27 DIAGNOSIS — I359 Nonrheumatic aortic valve disorder, unspecified: Secondary | ICD-10-CM | POA: Diagnosis not present

## 2014-06-27 DIAGNOSIS — I6350 Cerebral infarction due to unspecified occlusion or stenosis of unspecified cerebral artery: Secondary | ICD-10-CM

## 2014-06-27 DIAGNOSIS — Z5181 Encounter for therapeutic drug level monitoring: Secondary | ICD-10-CM

## 2014-06-27 DIAGNOSIS — R922 Inconclusive mammogram: Secondary | ICD-10-CM

## 2014-06-27 DIAGNOSIS — Z954 Presence of other heart-valve replacement: Secondary | ICD-10-CM

## 2014-06-27 LAB — POCT INR: INR: 3

## 2014-07-15 ENCOUNTER — Other Ambulatory Visit: Payer: Self-pay | Admitting: Cardiology

## 2014-07-17 DIAGNOSIS — H353290 Exudative age-related macular degeneration, unspecified eye, stage unspecified: Secondary | ICD-10-CM | POA: Diagnosis not present

## 2014-07-17 DIAGNOSIS — H35059 Retinal neovascularization, unspecified, unspecified eye: Secondary | ICD-10-CM | POA: Diagnosis not present

## 2014-07-23 ENCOUNTER — Other Ambulatory Visit: Payer: Self-pay | Admitting: Cardiology

## 2014-07-23 ENCOUNTER — Other Ambulatory Visit: Payer: Self-pay | Admitting: Pulmonary Disease

## 2014-07-23 ENCOUNTER — Ambulatory Visit
Admission: RE | Admit: 2014-07-23 | Discharge: 2014-07-23 | Disposition: A | Discharge status: Home / Self Care | Payer: Medicare Other | Source: Ambulatory Visit | Attending: Pulmonary Disease | Admitting: Pulmonary Disease

## 2014-07-23 DIAGNOSIS — R928 Other abnormal and inconclusive findings on diagnostic imaging of breast: Secondary | ICD-10-CM | POA: Diagnosis not present

## 2014-07-23 DIAGNOSIS — N6489 Other specified disorders of breast: Secondary | ICD-10-CM | POA: Diagnosis not present

## 2014-07-23 DIAGNOSIS — R922 Inconclusive mammogram: Secondary | ICD-10-CM

## 2014-07-25 ENCOUNTER — Ambulatory Visit (INDEPENDENT_AMBULATORY_CARE_PROVIDER_SITE_OTHER): Payer: Medicare Other | Admitting: *Deleted

## 2014-07-25 DIAGNOSIS — Z5181 Encounter for therapeutic drug level monitoring: Secondary | ICD-10-CM

## 2014-07-25 DIAGNOSIS — I359 Nonrheumatic aortic valve disorder, unspecified: Secondary | ICD-10-CM

## 2014-07-25 DIAGNOSIS — I6350 Cerebral infarction due to unspecified occlusion or stenosis of unspecified cerebral artery: Secondary | ICD-10-CM | POA: Diagnosis not present

## 2014-07-25 DIAGNOSIS — Z954 Presence of other heart-valve replacement: Secondary | ICD-10-CM

## 2014-07-25 LAB — POCT INR: INR: 4.1

## 2014-08-08 ENCOUNTER — Ambulatory Visit (INDEPENDENT_AMBULATORY_CARE_PROVIDER_SITE_OTHER): Payer: Medicare Other

## 2014-08-08 DIAGNOSIS — Z954 Presence of other heart-valve replacement: Secondary | ICD-10-CM

## 2014-08-08 DIAGNOSIS — Z5181 Encounter for therapeutic drug level monitoring: Secondary | ICD-10-CM | POA: Diagnosis not present

## 2014-08-08 DIAGNOSIS — I359 Nonrheumatic aortic valve disorder, unspecified: Secondary | ICD-10-CM

## 2014-08-08 DIAGNOSIS — I6350 Cerebral infarction due to unspecified occlusion or stenosis of unspecified cerebral artery: Secondary | ICD-10-CM

## 2014-08-08 LAB — POCT INR: INR: 3.6

## 2014-08-21 DIAGNOSIS — Z23 Encounter for immunization: Secondary | ICD-10-CM | POA: Diagnosis not present

## 2014-08-21 DIAGNOSIS — G454 Transient global amnesia: Secondary | ICD-10-CM | POA: Diagnosis not present

## 2014-08-21 DIAGNOSIS — Z952 Presence of prosthetic heart valve: Secondary | ICD-10-CM | POA: Diagnosis not present

## 2014-08-21 DIAGNOSIS — J449 Chronic obstructive pulmonary disease, unspecified: Secondary | ICD-10-CM | POA: Diagnosis not present

## 2014-08-21 DIAGNOSIS — I10 Essential (primary) hypertension: Secondary | ICD-10-CM | POA: Diagnosis not present

## 2014-08-22 ENCOUNTER — Ambulatory Visit (INDEPENDENT_AMBULATORY_CARE_PROVIDER_SITE_OTHER): Payer: Medicare Other | Admitting: Pharmacist Clinician (PhC)/ Clinical Pharmacy Specialist

## 2014-08-22 DIAGNOSIS — Z954 Presence of other heart-valve replacement: Secondary | ICD-10-CM

## 2014-08-22 DIAGNOSIS — I639 Cerebral infarction, unspecified: Secondary | ICD-10-CM

## 2014-08-22 DIAGNOSIS — Z5181 Encounter for therapeutic drug level monitoring: Secondary | ICD-10-CM

## 2014-08-22 DIAGNOSIS — Z952 Presence of prosthetic heart valve: Secondary | ICD-10-CM

## 2014-08-22 DIAGNOSIS — I359 Nonrheumatic aortic valve disorder, unspecified: Secondary | ICD-10-CM

## 2014-08-22 DIAGNOSIS — I6350 Cerebral infarction due to unspecified occlusion or stenosis of unspecified cerebral artery: Secondary | ICD-10-CM

## 2014-08-22 LAB — POCT INR: INR: 4.2

## 2014-08-28 DIAGNOSIS — H35052 Retinal neovascularization, unspecified, left eye: Secondary | ICD-10-CM | POA: Diagnosis not present

## 2014-08-28 DIAGNOSIS — H3532 Exudative age-related macular degeneration: Secondary | ICD-10-CM | POA: Diagnosis not present

## 2014-09-03 ENCOUNTER — Ambulatory Visit (INDEPENDENT_AMBULATORY_CARE_PROVIDER_SITE_OTHER): Payer: Medicare Other | Admitting: *Deleted

## 2014-09-03 DIAGNOSIS — I359 Nonrheumatic aortic valve disorder, unspecified: Secondary | ICD-10-CM

## 2014-09-03 DIAGNOSIS — I639 Cerebral infarction, unspecified: Secondary | ICD-10-CM

## 2014-09-03 DIAGNOSIS — Z954 Presence of other heart-valve replacement: Secondary | ICD-10-CM | POA: Diagnosis not present

## 2014-09-03 DIAGNOSIS — I6350 Cerebral infarction due to unspecified occlusion or stenosis of unspecified cerebral artery: Secondary | ICD-10-CM

## 2014-09-03 DIAGNOSIS — Z5181 Encounter for therapeutic drug level monitoring: Secondary | ICD-10-CM

## 2014-09-03 DIAGNOSIS — Z952 Presence of prosthetic heart valve: Secondary | ICD-10-CM

## 2014-09-03 LAB — POCT INR: INR: 1.6

## 2014-09-05 ENCOUNTER — Ambulatory Visit (INDEPENDENT_AMBULATORY_CARE_PROVIDER_SITE_OTHER): Payer: Medicare Other | Admitting: Otolaryngology

## 2014-09-05 DIAGNOSIS — H903 Sensorineural hearing loss, bilateral: Secondary | ICD-10-CM | POA: Diagnosis not present

## 2014-09-10 NOTE — Progress Notes (Signed)
HPI: FU coronary artery disease (s/p CABG), prior aortic valve replacement and aortic insufficiency. Last cardiac catheterization in 2011 revealed normal left main; 30% first diagonal, 50% LAD and 60% second diagonal. There was a 40-50% circumflex and a 40% right coronary artery. All grafts were occluded. Medical therapy recommended. Transesophageal echocardiogram in February of 2012 showed an ejection fraction of 45%. There was a St. Jude aortic valve with moderate perivalvular aortic insufficiency. There was moderate mitral regurgitation, moderate left atrial enlargement, mild right atrial enlargement and mild tricuspid regurgitation. Carotid Dopplers in July of 2015 showed 40-59% bilateral stenosis and followup recommended in one year. Echocardiogram September 2014 showed normal LV function, grade 1 diastolic dysfunction, mechanical aortic valve with mild valvular aortic insufficiency, mean gradient 24 mm of mercury. There was mild mitral regurgitation and mild to moderate left atrial enlargement. Pulmonary pressures moderately elevated. Since she was last seen, She has some dyspnea on exertion but no orthopnea, PND, pedal edema, palpitations, syncope or chest pain. She does complain of fatigue.   Current Outpatient Prescriptions  Medication Sig Dispense Refill  . amLODipine-benazepril (LOTREL) 5-20 MG per capsule TAKE ONE CAPSULE DAILY.  30 capsule  11  . aspirin 81 MG EC tablet Take 81 mg by mouth daily.        Marland Kitchen. atorvastatin (LIPITOR) 40 MG tablet TAKE ONE TABLET BY MOUTH ONCE DAILY.  30 tablet  3  . donepezil (ARICEPT) 10 MG tablet TAKE ONE TABLET BY MOUTH ONCE DAILY.  90 tablet  3  . donepezil (ARICEPT) 10 MG tablet Take 10 mg by mouth at bedtime.      . ergocalciferol (VITAMIN D2) 50000 UNITS capsule Take 50,000 Units by mouth once a week.       . ferrous sulfate 325 (65 FE) MG EC tablet Take 325 mg by mouth 3 (three) times daily with meals.      . furosemide (LASIX) 20 MG tablet TAKE  ONE TABLET BY MOUTH ONCE DAILY.  30 tablet  11  . Melatonin 3 MG TABS Take 3 mg by mouth at bedtime.        . metoprolol (LOPRESSOR) 25 MG tablet Take 0.5 tablets (12.5 mg total) by mouth 2 (two) times daily.  30 tablet  12  . Multiple Vitamins-Minerals (VITEYES AREDS FORMULA PO) Take 1 tablet by mouth daily.      . potassium chloride (K-DUR,KLOR-CON) 10 MEQ tablet TAKE ONE TABLET DAILY.  30 tablet  5  . pregabalin (LYRICA) 75 MG capsule Take 75 mg by mouth every other day. At bedtime       . traMADol (ULTRAM) 50 MG tablet as needed.      . traZODone (DESYREL) 50 MG tablet Take 1 tablet by mouth at bedtime as needed.      . warfarin (COUMADIN) 5 MG tablet Take 1 tablet by mouth daily or as directed by coumadin clinic  35 tablet  5   No current facility-administered medications for this visit.     Past Medical History  Diagnosis Date  . Carotid artery stenosis     Without infarction  . Stroke   . Hypertension     Unspecified  . Coronary artery disease   . Hypercholesterolemia     Pure  . LBBB (left bundle branch block)   . Macular degeneration (senile) of retina, unspecified   . Unspecified vitamin D deficiency   . Postsurgical aortocoronary bypass status   . Unspecified hereditary and idiopathic peripheral neuropathy   .  Transient global amnesia   . Heart valve replaced by other means     Past Surgical History  Procedure Laterality Date  . Aortic valve replacement    . Coronary artery bypass graft    . Appendectomy    . Tonsillectomy      History   Social History  . Marital Status: Widowed    Spouse Name: N/A    Number of Children: 2  . Years of Education: 12th   Occupational History  .     Social History Main Topics  . Smoking status: Former Smoker -- 0.50 packs/day    Types: Cigarettes    Quit date: 11/30/1991  . Smokeless tobacco: Never Used     Comment: Tobacco use-no  . Alcohol Use: No  . Drug Use: No  . Sexual Activity: No   Other Topics Concern  .  Not on file   Social History Narrative   Pt lives at home alone.   Caffeine Use: very rarely    ROS: no fevers or chills, productive cough, hemoptysis, dysphasia, odynophagia, melena, hematochezia, dysuria, hematuria, rash, seizure activity, orthopnea, PND, pedal edema, claudication. Remaining systems are negative.  Physical Exam: Well-developed well-nourished in no acute distress.  Skin is warm and dry.  HEENT is normal.  Neck is supple.  Chest is clear to auscultation with normal expansion.  Cardiovascular exam is regular rate and rhythm. 2/6 systolic and diastolic murmur left sternal border. Abdominal exam nontender or distended. No masses palpated. Extremities show no edema. neuro grossly intact  ECG Sinus rhythm, left bundle branch block.

## 2014-09-11 ENCOUNTER — Encounter: Payer: Self-pay | Admitting: Cardiology

## 2014-09-11 ENCOUNTER — Encounter: Payer: Self-pay | Admitting: *Deleted

## 2014-09-11 ENCOUNTER — Ambulatory Visit (INDEPENDENT_AMBULATORY_CARE_PROVIDER_SITE_OTHER): Payer: Medicare Other | Admitting: Cardiology

## 2014-09-11 ENCOUNTER — Ambulatory Visit (INDEPENDENT_AMBULATORY_CARE_PROVIDER_SITE_OTHER): Payer: Medicare Other | Admitting: Pharmacist Clinician (PhC)/ Clinical Pharmacy Specialist

## 2014-09-11 VITALS — BP 118/70 | HR 57 | Ht 62.5 in | Wt 108.3 lb

## 2014-09-11 DIAGNOSIS — I25810 Atherosclerosis of coronary artery bypass graft(s) without angina pectoris: Secondary | ICD-10-CM

## 2014-09-11 DIAGNOSIS — Z954 Presence of other heart-valve replacement: Secondary | ICD-10-CM

## 2014-09-11 DIAGNOSIS — Z952 Presence of prosthetic heart valve: Secondary | ICD-10-CM

## 2014-09-11 DIAGNOSIS — I639 Cerebral infarction, unspecified: Secondary | ICD-10-CM

## 2014-09-11 DIAGNOSIS — I359 Nonrheumatic aortic valve disorder, unspecified: Secondary | ICD-10-CM

## 2014-09-11 DIAGNOSIS — R5383 Other fatigue: Secondary | ICD-10-CM

## 2014-09-11 DIAGNOSIS — Z5181 Encounter for therapeutic drug level monitoring: Secondary | ICD-10-CM

## 2014-09-11 DIAGNOSIS — I6350 Cerebral infarction due to unspecified occlusion or stenosis of unspecified cerebral artery: Secondary | ICD-10-CM

## 2014-09-11 LAB — POCT INR: INR: 1.6

## 2014-09-11 LAB — HEPATIC FUNCTION PANEL
ALT: 13 U/L (ref 0–35)
AST: 28 U/L (ref 0–37)
Albumin: 4 g/dL (ref 3.5–5.2)
Alkaline Phosphatase: 78 U/L (ref 39–117)
BILIRUBIN INDIRECT: 0.5 mg/dL (ref 0.2–1.2)
BILIRUBIN TOTAL: 0.6 mg/dL (ref 0.2–1.2)
Bilirubin, Direct: 0.1 mg/dL (ref 0.0–0.3)
TOTAL PROTEIN: 6.8 g/dL (ref 6.0–8.3)

## 2014-09-11 LAB — LIPID PANEL
Cholesterol: 164 mg/dL (ref 0–200)
HDL: 62 mg/dL (ref >39)
LDL CALC: 85 mg/dL (ref 0–99)
TRIGLYCERIDES: 87 mg/dL (ref <150)
Total CHOL/HDL Ratio: 2.6 Ratio
VLDL: 17 mg/dL (ref 0–40)

## 2014-09-11 LAB — BASIC METABOLIC PANEL WITH GFR
BUN: 19 mg/dL (ref 6–23)
CHLORIDE: 104 meq/L (ref 96–112)
CO2: 29 mEq/L (ref 19–32)
Calcium: 9.6 mg/dL (ref 8.4–10.5)
Creat: 1.08 mg/dL (ref 0.50–1.10)
GFR, Est African American: 54 mL/min — ABNORMAL LOW
GFR, Est Non African American: 47 mL/min — ABNORMAL LOW
Glucose, Bld: 85 mg/dL (ref 70–99)
POTASSIUM: 4.7 meq/L (ref 3.5–5.3)
Sodium: 142 mEq/L (ref 135–145)

## 2014-09-11 LAB — CBC
HEMATOCRIT: 34.7 % — AB (ref 36.0–46.0)
HEMOGLOBIN: 11.6 g/dL — AB (ref 12.0–15.0)
MCH: 30.9 pg (ref 26.0–34.0)
MCHC: 33.4 g/dL (ref 30.0–36.0)
MCV: 92.3 fL (ref 78.0–100.0)
Platelets: 269 10*3/uL (ref 150–400)
RBC: 3.76 MIL/uL — ABNORMAL LOW (ref 3.87–5.11)
RDW: 13.5 % (ref 11.5–15.5)
WBC: 8.7 10*3/uL (ref 4.0–10.5)

## 2014-09-11 LAB — TSH: TSH: 2.079 u[IU]/mL (ref 0.350–4.500)

## 2014-09-11 NOTE — Patient Instructions (Signed)
Your physician wants you to follow-up in: 6 MONTHS WITH DR CRENSHAW You will receive a reminder letter in the mail two months in advance. If you don't receive a letter, please call our office to schedule the follow-up appointment.   Your physician recommends that you HAVE LAB WORK TODAY  Your physician has requested that you have an echocardiogram. Echocardiography is a painless test that uses sound waves to create images of your heart. It provides your doctor with information about the size and shape of your heart and how well your heart's chambers and valves are working. This procedure takes approximately one hour. There are no restrictions for this procedure.   

## 2014-09-11 NOTE — Assessment & Plan Note (Signed)
Continue statin. Follow-up carotid Dopplers July 2016.

## 2014-09-11 NOTE — Assessment & Plan Note (Signed)
Continue statin. 

## 2014-09-11 NOTE — Assessment & Plan Note (Signed)
Continue present medications. Check potassium and renal function. She is also complaining of fatigue. Check TSH and hemoglobin.

## 2014-09-11 NOTE — Assessment & Plan Note (Signed)
Continue statin. Check lipids and liver. 

## 2014-09-11 NOTE — Assessment & Plan Note (Signed)
Continue SBE prophylaxis. Schedule follow-up echocardiogram to reassess aortic insufficiency.

## 2014-09-12 ENCOUNTER — Encounter: Payer: Self-pay | Admitting: *Deleted

## 2014-09-12 ENCOUNTER — Other Ambulatory Visit: Payer: Self-pay | Admitting: Cardiology

## 2014-09-16 ENCOUNTER — Encounter: Payer: Self-pay | Admitting: Cardiology

## 2014-09-16 NOTE — Telephone Encounter (Signed)
This encounter was created in error - please disregard.

## 2014-09-16 NOTE — Telephone Encounter (Signed)
She would like a copy of her lab results from 1028-15 please. She would like to pick it up tomorrow.Please call and let her know ifi it is back.

## 2014-09-17 ENCOUNTER — Ambulatory Visit (HOSPITAL_COMMUNITY)
Admission: RE | Admit: 2014-09-17 | Discharge: 2014-09-17 | Disposition: A | Discharge status: Home / Self Care | Payer: Medicare Other | Source: Ambulatory Visit | Attending: Cardiovascular Disease | Admitting: Cardiovascular Disease

## 2014-09-17 DIAGNOSIS — I059 Rheumatic mitral valve disease, unspecified: Secondary | ICD-10-CM

## 2014-09-17 DIAGNOSIS — I2510 Atherosclerotic heart disease of native coronary artery without angina pectoris: Secondary | ICD-10-CM | POA: Insufficient documentation

## 2014-09-17 DIAGNOSIS — I359 Nonrheumatic aortic valve disorder, unspecified: Secondary | ICD-10-CM | POA: Insufficient documentation

## 2014-09-17 DIAGNOSIS — I25810 Atherosclerosis of coronary artery bypass graft(s) without angina pectoris: Secondary | ICD-10-CM | POA: Diagnosis not present

## 2014-09-17 DIAGNOSIS — R5383 Other fatigue: Secondary | ICD-10-CM

## 2014-09-17 NOTE — Progress Notes (Signed)
2D Echo Performed 09/17/2014    Creed Kail, RCS  

## 2014-09-20 ENCOUNTER — Telehealth: Payer: Self-pay | Admitting: Cardiology

## 2014-09-20 NOTE — Telephone Encounter (Signed)
Returning [name] call from yesterday,it was concerning her test results from 09-17-14.

## 2014-09-20 NOTE — Telephone Encounter (Signed)
Returned call to [name] (daughter). Provided echo results - slightly lower EF (pump function) than last year, mechanical AV looked about the same. She asked about the sutures around the valve - if the echo could tell if they were still intact/leaky.. She states a test had been done previously to look at this using dye and was done in the hospital.. Will defer to [name] to advise further.

## 2014-09-25 ENCOUNTER — Ambulatory Visit (INDEPENDENT_AMBULATORY_CARE_PROVIDER_SITE_OTHER): Payer: Medicare Other | Admitting: Surgery

## 2014-09-25 DIAGNOSIS — I6350 Cerebral infarction due to unspecified occlusion or stenosis of unspecified cerebral artery: Secondary | ICD-10-CM

## 2014-09-25 DIAGNOSIS — Z952 Presence of prosthetic heart valve: Secondary | ICD-10-CM

## 2014-09-25 DIAGNOSIS — I359 Nonrheumatic aortic valve disorder, unspecified: Secondary | ICD-10-CM

## 2014-09-25 DIAGNOSIS — Z954 Presence of other heart-valve replacement: Secondary | ICD-10-CM

## 2014-09-25 DIAGNOSIS — Z5181 Encounter for therapeutic drug level monitoring: Secondary | ICD-10-CM

## 2014-09-25 DIAGNOSIS — I639 Cerebral infarction, unspecified: Secondary | ICD-10-CM | POA: Diagnosis not present

## 2014-09-25 LAB — POCT INR: INR: 2.8

## 2014-09-25 NOTE — Telephone Encounter (Signed)
Spoke with pt dtr, questions regarding echo answered.

## 2014-10-03 ENCOUNTER — Ambulatory Visit (INDEPENDENT_AMBULATORY_CARE_PROVIDER_SITE_OTHER): Payer: Medicare Other | Admitting: Otolaryngology

## 2014-10-07 ENCOUNTER — Ambulatory Visit (INDEPENDENT_AMBULATORY_CARE_PROVIDER_SITE_OTHER): Payer: Medicare Other | Admitting: *Deleted

## 2014-10-07 DIAGNOSIS — I639 Cerebral infarction, unspecified: Secondary | ICD-10-CM

## 2014-10-07 DIAGNOSIS — Z5181 Encounter for therapeutic drug level monitoring: Secondary | ICD-10-CM | POA: Diagnosis not present

## 2014-10-07 DIAGNOSIS — I359 Nonrheumatic aortic valve disorder, unspecified: Secondary | ICD-10-CM | POA: Diagnosis not present

## 2014-10-07 DIAGNOSIS — Z954 Presence of other heart-valve replacement: Secondary | ICD-10-CM | POA: Diagnosis not present

## 2014-10-07 DIAGNOSIS — Z952 Presence of prosthetic heart valve: Secondary | ICD-10-CM

## 2014-10-07 DIAGNOSIS — I6350 Cerebral infarction due to unspecified occlusion or stenosis of unspecified cerebral artery: Secondary | ICD-10-CM

## 2014-10-07 LAB — POCT INR: INR: 2.2

## 2014-10-17 DIAGNOSIS — H3532 Exudative age-related macular degeneration: Secondary | ICD-10-CM | POA: Diagnosis not present

## 2014-10-17 DIAGNOSIS — H35052 Retinal neovascularization, unspecified, left eye: Secondary | ICD-10-CM | POA: Diagnosis not present

## 2014-10-31 ENCOUNTER — Ambulatory Visit (INDEPENDENT_AMBULATORY_CARE_PROVIDER_SITE_OTHER): Payer: Medicare Other | Admitting: *Deleted

## 2014-10-31 DIAGNOSIS — Z954 Presence of other heart-valve replacement: Secondary | ICD-10-CM

## 2014-10-31 DIAGNOSIS — Z5181 Encounter for therapeutic drug level monitoring: Secondary | ICD-10-CM | POA: Diagnosis not present

## 2014-10-31 DIAGNOSIS — I639 Cerebral infarction, unspecified: Secondary | ICD-10-CM | POA: Diagnosis not present

## 2014-10-31 DIAGNOSIS — I359 Nonrheumatic aortic valve disorder, unspecified: Secondary | ICD-10-CM

## 2014-10-31 DIAGNOSIS — Z952 Presence of prosthetic heart valve: Secondary | ICD-10-CM

## 2014-10-31 DIAGNOSIS — I6350 Cerebral infarction due to unspecified occlusion or stenosis of unspecified cerebral artery: Secondary | ICD-10-CM

## 2014-10-31 LAB — POCT INR: INR: 2.2

## 2014-11-11 ENCOUNTER — Other Ambulatory Visit: Payer: Self-pay | Admitting: Cardiology

## 2014-11-11 NOTE — Telephone Encounter (Signed)
Rx refill sent to patient pharmacy   

## 2014-11-20 DIAGNOSIS — Z952 Presence of prosthetic heart valve: Secondary | ICD-10-CM | POA: Diagnosis not present

## 2014-11-20 DIAGNOSIS — J449 Chronic obstructive pulmonary disease, unspecified: Secondary | ICD-10-CM | POA: Diagnosis not present

## 2014-11-20 DIAGNOSIS — I10 Essential (primary) hypertension: Secondary | ICD-10-CM | POA: Diagnosis not present

## 2014-11-28 ENCOUNTER — Ambulatory Visit (INDEPENDENT_AMBULATORY_CARE_PROVIDER_SITE_OTHER): Payer: Medicare Other

## 2014-11-28 DIAGNOSIS — I359 Nonrheumatic aortic valve disorder, unspecified: Secondary | ICD-10-CM | POA: Diagnosis not present

## 2014-11-28 DIAGNOSIS — I639 Cerebral infarction, unspecified: Secondary | ICD-10-CM | POA: Diagnosis not present

## 2014-11-28 DIAGNOSIS — Z954 Presence of other heart-valve replacement: Secondary | ICD-10-CM

## 2014-11-28 DIAGNOSIS — Z5181 Encounter for therapeutic drug level monitoring: Secondary | ICD-10-CM

## 2014-11-28 DIAGNOSIS — I6350 Cerebral infarction due to unspecified occlusion or stenosis of unspecified cerebral artery: Secondary | ICD-10-CM

## 2014-11-28 DIAGNOSIS — Z952 Presence of prosthetic heart valve: Secondary | ICD-10-CM

## 2014-11-28 LAB — POCT INR: INR: 2

## 2014-11-29 ENCOUNTER — Telehealth: Payer: Self-pay | Admitting: Cardiology

## 2014-11-29 NOTE — Telephone Encounter (Signed)
Close encounter 

## 2014-12-11 DIAGNOSIS — H3531 Nonexudative age-related macular degeneration: Secondary | ICD-10-CM | POA: Diagnosis not present

## 2014-12-26 ENCOUNTER — Ambulatory Visit (INDEPENDENT_AMBULATORY_CARE_PROVIDER_SITE_OTHER): Payer: Medicare Other | Admitting: Pharmacist

## 2014-12-26 DIAGNOSIS — I639 Cerebral infarction, unspecified: Secondary | ICD-10-CM | POA: Diagnosis not present

## 2014-12-26 DIAGNOSIS — I6350 Cerebral infarction due to unspecified occlusion or stenosis of unspecified cerebral artery: Secondary | ICD-10-CM

## 2014-12-26 DIAGNOSIS — I359 Nonrheumatic aortic valve disorder, unspecified: Secondary | ICD-10-CM | POA: Diagnosis not present

## 2014-12-26 DIAGNOSIS — Z952 Presence of prosthetic heart valve: Secondary | ICD-10-CM

## 2014-12-26 DIAGNOSIS — Z5181 Encounter for therapeutic drug level monitoring: Secondary | ICD-10-CM | POA: Diagnosis not present

## 2014-12-26 DIAGNOSIS — Z954 Presence of other heart-valve replacement: Secondary | ICD-10-CM

## 2014-12-26 LAB — POCT INR: INR: 1.8

## 2015-01-09 ENCOUNTER — Ambulatory Visit (INDEPENDENT_AMBULATORY_CARE_PROVIDER_SITE_OTHER): Payer: Medicare Other | Admitting: *Deleted

## 2015-01-09 DIAGNOSIS — I639 Cerebral infarction, unspecified: Secondary | ICD-10-CM

## 2015-01-09 DIAGNOSIS — I359 Nonrheumatic aortic valve disorder, unspecified: Secondary | ICD-10-CM | POA: Diagnosis not present

## 2015-01-09 DIAGNOSIS — Z952 Presence of prosthetic heart valve: Secondary | ICD-10-CM

## 2015-01-09 DIAGNOSIS — Z954 Presence of other heart-valve replacement: Secondary | ICD-10-CM | POA: Diagnosis not present

## 2015-01-09 DIAGNOSIS — Z5181 Encounter for therapeutic drug level monitoring: Secondary | ICD-10-CM | POA: Diagnosis not present

## 2015-01-09 DIAGNOSIS — I6350 Cerebral infarction due to unspecified occlusion or stenosis of unspecified cerebral artery: Secondary | ICD-10-CM

## 2015-01-09 LAB — POCT INR: INR: 1.9

## 2015-01-22 ENCOUNTER — Ambulatory Visit (INDEPENDENT_AMBULATORY_CARE_PROVIDER_SITE_OTHER): Payer: Medicare Other | Admitting: *Deleted

## 2015-01-22 DIAGNOSIS — I359 Nonrheumatic aortic valve disorder, unspecified: Secondary | ICD-10-CM

## 2015-01-22 DIAGNOSIS — Z5181 Encounter for therapeutic drug level monitoring: Secondary | ICD-10-CM

## 2015-01-22 DIAGNOSIS — Z954 Presence of other heart-valve replacement: Secondary | ICD-10-CM

## 2015-01-22 DIAGNOSIS — I639 Cerebral infarction, unspecified: Secondary | ICD-10-CM | POA: Diagnosis not present

## 2015-01-22 DIAGNOSIS — Z952 Presence of prosthetic heart valve: Secondary | ICD-10-CM

## 2015-01-22 DIAGNOSIS — I6350 Cerebral infarction due to unspecified occlusion or stenosis of unspecified cerebral artery: Secondary | ICD-10-CM

## 2015-01-22 LAB — POCT INR: INR: 2.7

## 2015-01-28 DIAGNOSIS — I10 Essential (primary) hypertension: Secondary | ICD-10-CM | POA: Diagnosis not present

## 2015-01-28 DIAGNOSIS — Z681 Body mass index (BMI) 19 or less, adult: Secondary | ICD-10-CM | POA: Diagnosis not present

## 2015-01-28 DIAGNOSIS — M79671 Pain in right foot: Secondary | ICD-10-CM | POA: Diagnosis not present

## 2015-01-30 DIAGNOSIS — H34832 Tributary (branch) retinal vein occlusion, left eye: Secondary | ICD-10-CM | POA: Diagnosis not present

## 2015-01-30 DIAGNOSIS — Z961 Presence of intraocular lens: Secondary | ICD-10-CM | POA: Diagnosis not present

## 2015-02-04 DIAGNOSIS — H3532 Exudative age-related macular degeneration: Secondary | ICD-10-CM | POA: Diagnosis not present

## 2015-02-06 ENCOUNTER — Ambulatory Visit (INDEPENDENT_AMBULATORY_CARE_PROVIDER_SITE_OTHER): Payer: Medicare Other | Admitting: *Deleted

## 2015-02-06 DIAGNOSIS — Z5181 Encounter for therapeutic drug level monitoring: Secondary | ICD-10-CM | POA: Diagnosis not present

## 2015-02-06 DIAGNOSIS — I359 Nonrheumatic aortic valve disorder, unspecified: Secondary | ICD-10-CM | POA: Diagnosis not present

## 2015-02-06 DIAGNOSIS — Z954 Presence of other heart-valve replacement: Secondary | ICD-10-CM | POA: Diagnosis not present

## 2015-02-06 DIAGNOSIS — I639 Cerebral infarction, unspecified: Secondary | ICD-10-CM | POA: Diagnosis not present

## 2015-02-06 DIAGNOSIS — I6350 Cerebral infarction due to unspecified occlusion or stenosis of unspecified cerebral artery: Secondary | ICD-10-CM

## 2015-02-06 DIAGNOSIS — Z952 Presence of prosthetic heart valve: Secondary | ICD-10-CM

## 2015-02-06 LAB — POCT INR: INR: 3.6

## 2015-02-12 ENCOUNTER — Telehealth: Payer: Self-pay | Admitting: *Deleted

## 2015-02-12 NOTE — Telephone Encounter (Signed)
Lisbeth PlyDonna Carter called stating pt had seen Dr Juanetta GoslingHawkins and he has ordered Levaquin 500mg  daily times 10 days and Medrol 4mg  dose pack times 6 days  Pt is going to start Levaquin today and Prednisone tomorrow . Instructed Lupita LeashDonna that we will need to check her INR on Friday and appt made for that date and she states understanding.

## 2015-02-14 ENCOUNTER — Ambulatory Visit (INDEPENDENT_AMBULATORY_CARE_PROVIDER_SITE_OTHER): Payer: Medicare Other | Admitting: *Deleted

## 2015-02-14 DIAGNOSIS — Z5181 Encounter for therapeutic drug level monitoring: Secondary | ICD-10-CM

## 2015-02-14 DIAGNOSIS — I6350 Cerebral infarction due to unspecified occlusion or stenosis of unspecified cerebral artery: Secondary | ICD-10-CM

## 2015-02-14 DIAGNOSIS — I639 Cerebral infarction, unspecified: Secondary | ICD-10-CM

## 2015-02-14 DIAGNOSIS — I359 Nonrheumatic aortic valve disorder, unspecified: Secondary | ICD-10-CM | POA: Diagnosis not present

## 2015-02-14 DIAGNOSIS — Z954 Presence of other heart-valve replacement: Secondary | ICD-10-CM

## 2015-02-14 DIAGNOSIS — Z952 Presence of prosthetic heart valve: Secondary | ICD-10-CM

## 2015-02-14 LAB — POCT INR: INR: 4.1

## 2015-02-19 DIAGNOSIS — J209 Acute bronchitis, unspecified: Secondary | ICD-10-CM | POA: Diagnosis not present

## 2015-02-19 DIAGNOSIS — J449 Chronic obstructive pulmonary disease, unspecified: Secondary | ICD-10-CM | POA: Diagnosis not present

## 2015-02-19 DIAGNOSIS — Z952 Presence of prosthetic heart valve: Secondary | ICD-10-CM | POA: Diagnosis not present

## 2015-02-19 DIAGNOSIS — I10 Essential (primary) hypertension: Secondary | ICD-10-CM | POA: Diagnosis not present

## 2015-02-20 ENCOUNTER — Ambulatory Visit (INDEPENDENT_AMBULATORY_CARE_PROVIDER_SITE_OTHER): Payer: Medicare Other | Admitting: *Deleted

## 2015-02-20 DIAGNOSIS — I359 Nonrheumatic aortic valve disorder, unspecified: Secondary | ICD-10-CM

## 2015-02-20 DIAGNOSIS — I639 Cerebral infarction, unspecified: Secondary | ICD-10-CM | POA: Diagnosis not present

## 2015-02-20 DIAGNOSIS — I6350 Cerebral infarction due to unspecified occlusion or stenosis of unspecified cerebral artery: Secondary | ICD-10-CM

## 2015-02-20 DIAGNOSIS — Z5181 Encounter for therapeutic drug level monitoring: Secondary | ICD-10-CM | POA: Diagnosis not present

## 2015-02-20 DIAGNOSIS — Z952 Presence of prosthetic heart valve: Secondary | ICD-10-CM

## 2015-02-20 DIAGNOSIS — Z954 Presence of other heart-valve replacement: Secondary | ICD-10-CM | POA: Diagnosis not present

## 2015-02-20 LAB — POCT INR: INR: 1.7

## 2015-03-06 ENCOUNTER — Ambulatory Visit (INDEPENDENT_AMBULATORY_CARE_PROVIDER_SITE_OTHER): Payer: Medicare Other | Admitting: *Deleted

## 2015-03-06 DIAGNOSIS — I6350 Cerebral infarction due to unspecified occlusion or stenosis of unspecified cerebral artery: Secondary | ICD-10-CM

## 2015-03-06 DIAGNOSIS — Z952 Presence of prosthetic heart valve: Secondary | ICD-10-CM

## 2015-03-06 DIAGNOSIS — I639 Cerebral infarction, unspecified: Secondary | ICD-10-CM

## 2015-03-06 DIAGNOSIS — Z5181 Encounter for therapeutic drug level monitoring: Secondary | ICD-10-CM | POA: Diagnosis not present

## 2015-03-06 DIAGNOSIS — Z954 Presence of other heart-valve replacement: Secondary | ICD-10-CM | POA: Diagnosis not present

## 2015-03-06 DIAGNOSIS — I359 Nonrheumatic aortic valve disorder, unspecified: Secondary | ICD-10-CM

## 2015-03-06 LAB — POCT INR: INR: 3.8

## 2015-03-14 NOTE — Progress Notes (Signed)
HPI: FU coronary artery disease (s/p CABG), prior aortic valve replacement and aortic insufficiency. Last cardiac catheterization in 2011 revealed normal left main; 30% first diagonal, 50% LAD and 60% second diagonal. There was a 40-50% circumflex and a 40% right coronary artery. All grafts were occluded. Medical therapy recommended. Transesophageal echocardiogram in February of 2012 showed an ejection fraction of 45%. There was a St. Jude aortic valve with moderate perivalvular aortic insufficiency. There was moderate mitral regurgitation, moderate left atrial enlargement, mild right atrial enlargement and mild tricuspid regurgitation. Carotid Dopplers in July of 2015 showed 40-59% bilateral stenosis and followup recommended in one year. Echo 11/15 showed EF 40-45, grade 1 diastolic dysfunction, AVR with mild AI; mean gradient 19 mmHg; moderate MR; mild LAE. Since she was last seen, she has dyspnea with more extreme activities. No orthopnea, PND, pedal edema, chest pain or syncope.  Current Outpatient Prescriptions  Medication Sig Dispense Refill  . amLODipine-benazepril (LOTREL) 5-20 MG per capsule TAKE ONE CAPSULE DAILY. 30 capsule 11  . aspirin 81 MG EC tablet Take 81 mg by mouth daily.      Marland Kitchen. atorvastatin (LIPITOR) 40 MG tablet TAKE ONE TABLET BY MOUTH ONCE DAILY. 30 tablet 8  . donepezil (ARICEPT) 10 MG tablet TAKE ONE TABLET BY MOUTH ONCE DAILY. 90 tablet 3  . ergocalciferol (VITAMIN D2) 50000 UNITS capsule Take 50,000 Units by mouth once a week.     . ferrous sulfate 325 (65 FE) MG EC tablet Take 325 mg by mouth 3 (three) times daily with meals.    . furosemide (LASIX) 20 MG tablet TAKE ONE TABLET BY MOUTH ONCE DAILY. 30 tablet 11  . Melatonin 3 MG TABS Take 3 mg by mouth at bedtime.      . metoprolol (LOPRESSOR) 25 MG tablet Take 0.5 tablets (12.5 mg total) by mouth 2 (two) times daily. 30 tablet 12  . Multiple Vitamins-Minerals (VITEYES AREDS FORMULA PO) Take 1 tablet by mouth daily.     . potassium chloride (K-DUR,KLOR-CON) 10 MEQ tablet TAKE ONE TABLET BY MOUTH ONCE DAILY. 30 tablet 6  . pregabalin (LYRICA) 75 MG capsule Take 75 mg by mouth every other day. At bedtime     . traMADol (ULTRAM) 50 MG tablet as needed.    . traZODone (DESYREL) 50 MG tablet Take 1 tablet by mouth at bedtime as needed.    . warfarin (COUMADIN) 5 MG tablet Take 1 tablet by mouth daily or as directed by coumadin clinic 35 tablet 5   No current facility-administered medications for this visit.     Past Medical History  Diagnosis Date  . Carotid artery stenosis     Without infarction  . Stroke   . Hypertension     Unspecified  . Coronary artery disease   . Hypercholesterolemia     Pure  . LBBB (left bundle branch block)   . Macular degeneration (senile) of retina, unspecified   . Unspecified vitamin D deficiency   . Postsurgical aortocoronary bypass status   . Unspecified hereditary and idiopathic peripheral neuropathy   . Transient global amnesia   . Heart valve replaced by other means     Past Surgical History  Procedure Laterality Date  . Aortic valve replacement    . Coronary artery bypass graft    . Appendectomy    . Tonsillectomy      History   Social History  . Marital Status: Widowed    Spouse Name: N/A  . Number  of Children: 2  . Years of Education: 12th   Occupational History  .     Social History Main Topics  . Smoking status: Former Smoker -- 0.50 packs/day    Types: Cigarettes    Quit date: 11/30/1991  . Smokeless tobacco: Never Used     Comment: Tobacco use-no  . Alcohol Use: No  . Drug Use: No  . Sexual Activity: No   Other Topics Concern  . Not on file   Social History Narrative   Pt lives at home alone.   Caffeine Use: very rarely    ROS: no fevers or chills, productive cough, hemoptysis, dysphasia, odynophagia, melena, hematochezia, dysuria, hematuria, rash, seizure activity, orthopnea, PND, pedal edema, claudication. Remaining systems  are negative.  Physical Exam: Well-developed well-nourished in no acute distress.  Skin is warm and dry.  HEENT is normal.  Neck is supple.  Chest is clear to auscultation with normal expansion.  Cardiovascular exam is regular rate and rhythm. 2/6 systolic and diastolic murmur left sternal border. Abdominal exam nontender or distended. No masses palpated. Extremities show no edema. neuro grossly intact  ECG sinus bradycardia, left bundle branch block.

## 2015-03-18 ENCOUNTER — Ambulatory Visit (INDEPENDENT_AMBULATORY_CARE_PROVIDER_SITE_OTHER): Payer: Medicare Other | Admitting: Cardiology

## 2015-03-18 ENCOUNTER — Encounter: Payer: Self-pay | Admitting: *Deleted

## 2015-03-18 ENCOUNTER — Encounter: Payer: Self-pay | Admitting: Cardiology

## 2015-03-18 VITALS — BP 142/52 | HR 54 | Ht 62.0 in | Wt 109.1 lb

## 2015-03-18 DIAGNOSIS — I25700 Atherosclerosis of coronary artery bypass graft(s), unspecified, with unstable angina pectoris: Secondary | ICD-10-CM

## 2015-03-18 DIAGNOSIS — Z954 Presence of other heart-valve replacement: Secondary | ICD-10-CM

## 2015-03-18 DIAGNOSIS — I639 Cerebral infarction, unspecified: Secondary | ICD-10-CM | POA: Diagnosis not present

## 2015-03-18 DIAGNOSIS — I6523 Occlusion and stenosis of bilateral carotid arteries: Secondary | ICD-10-CM | POA: Diagnosis not present

## 2015-03-18 DIAGNOSIS — Z952 Presence of prosthetic heart valve: Secondary | ICD-10-CM

## 2015-03-18 LAB — HEPATIC FUNCTION PANEL
ALBUMIN: 4.2 g/dL (ref 3.5–5.2)
ALT: 13 U/L (ref 0–35)
AST: 27 U/L (ref 0–37)
Alkaline Phosphatase: 72 U/L (ref 39–117)
BILIRUBIN DIRECT: 0.1 mg/dL (ref 0.0–0.3)
BILIRUBIN TOTAL: 0.7 mg/dL (ref 0.2–1.2)
Indirect Bilirubin: 0.6 mg/dL (ref 0.2–1.2)
Total Protein: 6.9 g/dL (ref 6.0–8.3)

## 2015-03-18 LAB — LIPID PANEL
CHOL/HDL RATIO: 1.9 ratio
CHOLESTEROL: 167 mg/dL (ref 0–200)
HDL: 86 mg/dL (ref >=46)
LDL Cholesterol: 65 mg/dL (ref 0–99)
Triglycerides: 78 mg/dL (ref <150)
VLDL: 16 mg/dL (ref 0–40)

## 2015-03-18 LAB — BASIC METABOLIC PANEL WITH GFR
BUN: 18 mg/dL (ref 6–23)
CALCIUM: 9.3 mg/dL (ref 8.4–10.5)
CO2: 29 meq/L (ref 19–32)
Chloride: 104 mEq/L (ref 96–112)
Creat: 0.95 mg/dL (ref 0.50–1.10)
GFR, Est African American: 63 mL/min
GFR, Est Non African American: 54 mL/min — ABNORMAL LOW
GLUCOSE: 86 mg/dL (ref 70–99)
Potassium: 4.3 mEq/L (ref 3.5–5.3)
SODIUM: 143 meq/L (ref 135–145)

## 2015-03-18 NOTE — Assessment & Plan Note (Signed)
Continue statin. Check lipids and liver. 

## 2015-03-18 NOTE — Patient Instructions (Signed)
Your physician wants you to follow-up in: 6 MONTHS WITH DR Jens SomRENSHAW You will receive a reminder letter in the mail two months in advance. If you don't receive a letter, please call our office to schedule the follow-up appointment.   Your physician recommends that you HAVE LAB WORK TODAY  Your physician has requested that you have an echocardiogram. Echocardiography is a painless test that uses sound waves to create images of your heart. It provides your doctor with information about the size and shape of your heart and how well your heart's chambers and valves are working. This procedure takes approximately one hour. There are no restrictions for this procedure.DUE IN November  Your physician has requested that you have a carotid duplex. This test is an ultrasound of the carotid arteries in your neck. It looks at blood flow through these arteries that supply the brain with blood. Allow one hour for this exam. There are no restrictions or special instructions.DUE IN July  PATIENT WOULD LIKE TO HAVE TESTING AT THE [address] LOCATION

## 2015-03-18 NOTE — Assessment & Plan Note (Signed)
Continue statin. Follow-up carotid Dopplers July 2016. 

## 2015-03-18 NOTE — Assessment & Plan Note (Signed)
Continue SBE prophylaxis. Plan follow-up echocardiogram to reassess aortic insufficiency in November.

## 2015-03-18 NOTE — Assessment & Plan Note (Signed)
Blood pressure controlled. Continue present medications. Check potassium and renal function. Given Coumadin use our also check hemoglobin.

## 2015-03-18 NOTE — Assessment & Plan Note (Signed)
Continue statin. 

## 2015-03-20 ENCOUNTER — Encounter: Payer: Self-pay | Admitting: *Deleted

## 2015-03-20 ENCOUNTER — Ambulatory Visit (INDEPENDENT_AMBULATORY_CARE_PROVIDER_SITE_OTHER): Payer: Medicare Other | Admitting: *Deleted

## 2015-03-20 DIAGNOSIS — I359 Nonrheumatic aortic valve disorder, unspecified: Secondary | ICD-10-CM | POA: Diagnosis not present

## 2015-03-20 DIAGNOSIS — Z5181 Encounter for therapeutic drug level monitoring: Secondary | ICD-10-CM | POA: Diagnosis not present

## 2015-03-20 DIAGNOSIS — I639 Cerebral infarction, unspecified: Secondary | ICD-10-CM

## 2015-03-20 DIAGNOSIS — Z954 Presence of other heart-valve replacement: Secondary | ICD-10-CM

## 2015-03-20 DIAGNOSIS — Z952 Presence of prosthetic heart valve: Secondary | ICD-10-CM

## 2015-03-20 DIAGNOSIS — I6350 Cerebral infarction due to unspecified occlusion or stenosis of unspecified cerebral artery: Secondary | ICD-10-CM

## 2015-03-20 LAB — POCT INR: INR: 2.8

## 2015-03-21 ENCOUNTER — Other Ambulatory Visit: Payer: Self-pay | Admitting: Cardiology

## 2015-03-31 ENCOUNTER — Other Ambulatory Visit: Payer: Self-pay | Admitting: Cardiology

## 2015-04-01 DIAGNOSIS — H3532 Exudative age-related macular degeneration: Secondary | ICD-10-CM | POA: Diagnosis not present

## 2015-04-07 ENCOUNTER — Other Ambulatory Visit: Payer: Self-pay | Admitting: Cardiology

## 2015-04-07 NOTE — Telephone Encounter (Signed)
Rx(s) sent to pharmacy electronically.  

## 2015-04-10 ENCOUNTER — Ambulatory Visit (INDEPENDENT_AMBULATORY_CARE_PROVIDER_SITE_OTHER): Payer: Medicare Other | Admitting: *Deleted

## 2015-04-10 DIAGNOSIS — I639 Cerebral infarction, unspecified: Secondary | ICD-10-CM | POA: Diagnosis not present

## 2015-04-10 DIAGNOSIS — Z954 Presence of other heart-valve replacement: Secondary | ICD-10-CM

## 2015-04-10 DIAGNOSIS — I359 Nonrheumatic aortic valve disorder, unspecified: Secondary | ICD-10-CM

## 2015-04-10 DIAGNOSIS — Z5181 Encounter for therapeutic drug level monitoring: Secondary | ICD-10-CM

## 2015-04-10 DIAGNOSIS — I6350 Cerebral infarction due to unspecified occlusion or stenosis of unspecified cerebral artery: Secondary | ICD-10-CM

## 2015-04-10 DIAGNOSIS — Z952 Presence of prosthetic heart valve: Secondary | ICD-10-CM

## 2015-04-10 LAB — POCT INR: INR: 2.1

## 2015-04-15 ENCOUNTER — Other Ambulatory Visit: Payer: Self-pay | Admitting: Cardiology

## 2015-04-24 ENCOUNTER — Ambulatory Visit (INDEPENDENT_AMBULATORY_CARE_PROVIDER_SITE_OTHER): Payer: Medicare Other | Admitting: *Deleted

## 2015-04-24 DIAGNOSIS — I359 Nonrheumatic aortic valve disorder, unspecified: Secondary | ICD-10-CM | POA: Diagnosis not present

## 2015-04-24 DIAGNOSIS — I6350 Cerebral infarction due to unspecified occlusion or stenosis of unspecified cerebral artery: Secondary | ICD-10-CM

## 2015-04-24 DIAGNOSIS — Z952 Presence of prosthetic heart valve: Secondary | ICD-10-CM

## 2015-04-24 DIAGNOSIS — Z954 Presence of other heart-valve replacement: Secondary | ICD-10-CM

## 2015-04-24 DIAGNOSIS — Z5181 Encounter for therapeutic drug level monitoring: Secondary | ICD-10-CM | POA: Diagnosis not present

## 2015-04-24 DIAGNOSIS — I639 Cerebral infarction, unspecified: Secondary | ICD-10-CM

## 2015-04-24 LAB — POCT INR: INR: 2.4

## 2015-05-15 ENCOUNTER — Ambulatory Visit (INDEPENDENT_AMBULATORY_CARE_PROVIDER_SITE_OTHER): Payer: Medicare Other | Admitting: *Deleted

## 2015-05-15 DIAGNOSIS — Z954 Presence of other heart-valve replacement: Secondary | ICD-10-CM | POA: Diagnosis not present

## 2015-05-15 DIAGNOSIS — I6350 Cerebral infarction due to unspecified occlusion or stenosis of unspecified cerebral artery: Secondary | ICD-10-CM

## 2015-05-15 DIAGNOSIS — I359 Nonrheumatic aortic valve disorder, unspecified: Secondary | ICD-10-CM

## 2015-05-15 DIAGNOSIS — Z952 Presence of prosthetic heart valve: Secondary | ICD-10-CM

## 2015-05-15 DIAGNOSIS — I639 Cerebral infarction, unspecified: Secondary | ICD-10-CM | POA: Diagnosis not present

## 2015-05-15 DIAGNOSIS — Z5181 Encounter for therapeutic drug level monitoring: Secondary | ICD-10-CM | POA: Diagnosis not present

## 2015-05-15 LAB — POCT INR: INR: 2.9

## 2015-05-22 DIAGNOSIS — G454 Transient global amnesia: Secondary | ICD-10-CM | POA: Diagnosis not present

## 2015-05-22 DIAGNOSIS — J449 Chronic obstructive pulmonary disease, unspecified: Secondary | ICD-10-CM | POA: Diagnosis not present

## 2015-05-22 DIAGNOSIS — Z952 Presence of prosthetic heart valve: Secondary | ICD-10-CM | POA: Diagnosis not present

## 2015-05-22 DIAGNOSIS — I10 Essential (primary) hypertension: Secondary | ICD-10-CM | POA: Diagnosis not present

## 2015-05-27 ENCOUNTER — Other Ambulatory Visit: Payer: Self-pay | Admitting: *Deleted

## 2015-05-27 ENCOUNTER — Ambulatory Visit (INDEPENDENT_AMBULATORY_CARE_PROVIDER_SITE_OTHER): Payer: Medicare Other | Admitting: Pharmacist Clinician (PhC)/ Clinical Pharmacy Specialist

## 2015-05-27 ENCOUNTER — Ambulatory Visit (HOSPITAL_COMMUNITY): Payer: Medicare Other | Attending: Cardiology

## 2015-05-27 ENCOUNTER — Encounter (HOSPITAL_COMMUNITY): Payer: Medicare Other

## 2015-05-27 DIAGNOSIS — Z954 Presence of other heart-valve replacement: Secondary | ICD-10-CM | POA: Diagnosis not present

## 2015-05-27 DIAGNOSIS — I6523 Occlusion and stenosis of bilateral carotid arteries: Secondary | ICD-10-CM

## 2015-05-27 DIAGNOSIS — I359 Nonrheumatic aortic valve disorder, unspecified: Secondary | ICD-10-CM | POA: Diagnosis not present

## 2015-05-27 DIAGNOSIS — I6350 Cerebral infarction due to unspecified occlusion or stenosis of unspecified cerebral artery: Secondary | ICD-10-CM

## 2015-05-27 DIAGNOSIS — I639 Cerebral infarction, unspecified: Secondary | ICD-10-CM | POA: Diagnosis not present

## 2015-05-27 DIAGNOSIS — Z5181 Encounter for therapeutic drug level monitoring: Secondary | ICD-10-CM | POA: Diagnosis not present

## 2015-05-27 DIAGNOSIS — Z952 Presence of prosthetic heart valve: Secondary | ICD-10-CM

## 2015-05-27 LAB — POCT INR: INR: 2.5

## 2015-05-29 ENCOUNTER — Other Ambulatory Visit: Payer: Self-pay | Admitting: Cardiology

## 2015-06-03 DIAGNOSIS — H3532 Exudative age-related macular degeneration: Secondary | ICD-10-CM | POA: Diagnosis not present

## 2015-06-24 ENCOUNTER — Other Ambulatory Visit: Payer: Self-pay

## 2015-06-24 DIAGNOSIS — Z1231 Encounter for screening mammogram for malignant neoplasm of breast: Secondary | ICD-10-CM

## 2015-06-26 ENCOUNTER — Ambulatory Visit (INDEPENDENT_AMBULATORY_CARE_PROVIDER_SITE_OTHER): Payer: Medicare Other | Admitting: *Deleted

## 2015-06-26 DIAGNOSIS — I359 Nonrheumatic aortic valve disorder, unspecified: Secondary | ICD-10-CM | POA: Diagnosis not present

## 2015-06-26 DIAGNOSIS — I6350 Cerebral infarction due to unspecified occlusion or stenosis of unspecified cerebral artery: Secondary | ICD-10-CM

## 2015-06-26 DIAGNOSIS — Z5181 Encounter for therapeutic drug level monitoring: Secondary | ICD-10-CM | POA: Diagnosis not present

## 2015-06-26 DIAGNOSIS — Z954 Presence of other heart-valve replacement: Secondary | ICD-10-CM

## 2015-06-26 DIAGNOSIS — Z952 Presence of prosthetic heart valve: Secondary | ICD-10-CM

## 2015-06-26 DIAGNOSIS — I639 Cerebral infarction, unspecified: Secondary | ICD-10-CM

## 2015-06-26 LAB — POCT INR: INR: 2.3

## 2015-07-14 ENCOUNTER — Other Ambulatory Visit: Payer: Self-pay | Admitting: Diagnostic Neuroimaging

## 2015-07-24 ENCOUNTER — Ambulatory Visit (INDEPENDENT_AMBULATORY_CARE_PROVIDER_SITE_OTHER): Payer: Medicare Other

## 2015-07-24 DIAGNOSIS — I359 Nonrheumatic aortic valve disorder, unspecified: Secondary | ICD-10-CM | POA: Diagnosis not present

## 2015-07-24 DIAGNOSIS — I6350 Cerebral infarction due to unspecified occlusion or stenosis of unspecified cerebral artery: Secondary | ICD-10-CM

## 2015-07-24 DIAGNOSIS — Z954 Presence of other heart-valve replacement: Secondary | ICD-10-CM

## 2015-07-24 DIAGNOSIS — Z5181 Encounter for therapeutic drug level monitoring: Secondary | ICD-10-CM | POA: Diagnosis not present

## 2015-07-24 DIAGNOSIS — I639 Cerebral infarction, unspecified: Secondary | ICD-10-CM

## 2015-07-24 DIAGNOSIS — Z952 Presence of prosthetic heart valve: Secondary | ICD-10-CM

## 2015-07-24 LAB — POCT INR: INR: 2.8

## 2015-08-05 DIAGNOSIS — H3532 Exudative age-related macular degeneration: Secondary | ICD-10-CM | POA: Diagnosis not present

## 2015-08-07 ENCOUNTER — Ambulatory Visit
Admission: RE | Admit: 2015-08-07 | Discharge: 2015-08-07 | Disposition: A | Discharge status: Home / Self Care | Payer: Medicare Other | Source: Ambulatory Visit

## 2015-08-07 ENCOUNTER — Other Ambulatory Visit: Payer: Self-pay | Admitting: Cardiology

## 2015-08-07 DIAGNOSIS — Z1231 Encounter for screening mammogram for malignant neoplasm of breast: Secondary | ICD-10-CM | POA: Diagnosis not present

## 2015-08-08 ENCOUNTER — Other Ambulatory Visit: Payer: Self-pay | Admitting: Pulmonary Disease

## 2015-08-08 DIAGNOSIS — R928 Other abnormal and inconclusive findings on diagnostic imaging of breast: Secondary | ICD-10-CM

## 2015-08-12 ENCOUNTER — Other Ambulatory Visit: Payer: Self-pay | Admitting: Diagnostic Neuroimaging

## 2015-08-12 NOTE — Telephone Encounter (Signed)
Has appt 08-25-15 at 1520 for RV med refill

## 2015-08-14 ENCOUNTER — Ambulatory Visit
Admission: RE | Admit: 2015-08-14 | Discharge: 2015-08-14 | Disposition: A | Discharge status: Home / Self Care | Payer: Medicare Other | Source: Ambulatory Visit | Attending: Pulmonary Disease | Admitting: Pulmonary Disease

## 2015-08-14 DIAGNOSIS — R928 Other abnormal and inconclusive findings on diagnostic imaging of breast: Secondary | ICD-10-CM | POA: Diagnosis not present

## 2015-08-14 DIAGNOSIS — N649 Disorder of breast, unspecified: Secondary | ICD-10-CM | POA: Diagnosis not present

## 2015-08-21 ENCOUNTER — Other Ambulatory Visit: Payer: Self-pay | Admitting: Pulmonary Disease

## 2015-08-21 DIAGNOSIS — N6489 Other specified disorders of breast: Secondary | ICD-10-CM

## 2015-08-21 DIAGNOSIS — Z23 Encounter for immunization: Secondary | ICD-10-CM | POA: Diagnosis not present

## 2015-08-21 DIAGNOSIS — N63 Unspecified lump in breast: Secondary | ICD-10-CM | POA: Diagnosis not present

## 2015-08-21 DIAGNOSIS — J449 Chronic obstructive pulmonary disease, unspecified: Secondary | ICD-10-CM | POA: Diagnosis not present

## 2015-08-21 DIAGNOSIS — I10 Essential (primary) hypertension: Secondary | ICD-10-CM | POA: Diagnosis not present

## 2015-08-21 DIAGNOSIS — Z952 Presence of prosthetic heart valve: Secondary | ICD-10-CM | POA: Diagnosis not present

## 2015-08-23 ENCOUNTER — Other Ambulatory Visit: Payer: Self-pay | Admitting: Cardiology

## 2015-08-25 ENCOUNTER — Other Ambulatory Visit: Payer: Self-pay

## 2015-08-25 ENCOUNTER — Ambulatory Visit: Payer: Self-pay | Admitting: Diagnostic Neuroimaging

## 2015-08-25 MED ORDER — FUROSEMIDE 20 MG PO TABS: 20.0000 mg | ORAL_TABLET | Freq: Every day | ORAL | Status: DC

## 2015-08-27 ENCOUNTER — Other Ambulatory Visit: Payer: Self-pay | Admitting: Pulmonary Disease

## 2015-08-27 DIAGNOSIS — N6489 Other specified disorders of breast: Secondary | ICD-10-CM

## 2015-08-28 ENCOUNTER — Telehealth: Payer: Self-pay | Admitting: Cardiology

## 2015-08-28 ENCOUNTER — Ambulatory Visit
Admission: RE | Admit: 2015-08-28 | Discharge: 2015-08-28 | Disposition: A | Discharge status: Home / Self Care | Payer: Medicare Other | Source: Ambulatory Visit | Attending: Pulmonary Disease | Admitting: Pulmonary Disease

## 2015-08-28 DIAGNOSIS — N6489 Other specified disorders of breast: Secondary | ICD-10-CM

## 2015-08-28 NOTE — Telephone Encounter (Signed)
[facility]  Patient is on Coumadin and she was not told to hold this medication.---Please let Larita FifeLynn know if they can proceed with the biopsy or if it needs to be rescheduled.

## 2015-08-28 NOTE — Telephone Encounter (Signed)
If no ho stroke, would not bridge [address] Simpson

## 2015-08-28 NOTE — Telephone Encounter (Signed)
[facility].Advised will check with our Pharmacist Kennon RoundsSally to find out if ok for patient to hold coumadin and how long to hold coumadin before breast biopsy.Message sent to [facility] for advice.

## 2015-08-28 NOTE — Telephone Encounter (Signed)
Spoke with pt's daughter.  Her chart mentions a history of stroke but daughter states she has never had a stroke.  That diagnosis was listed in her chart for many years and I am not able to verify when it may have occurred.  She also has a history of mechanical AVR.  Will send to Dr. Jens Somrenshaw for advice on if she should or should not be bridged with this questionable history of stroke.

## 2015-08-29 NOTE — Telephone Encounter (Signed)
Pt's daughter is calling back to see if Dr. Jens Somrenshaw is ok with her holding her coumadin for her have a biopsy on her rt breast. Please f/u with her  Thanks

## 2015-09-01 NOTE — Telephone Encounter (Signed)
Spoke with pt's daughter. They are confident that she has never had a stroke.  Given this, will not bridge with Lovenox- will just hold Coumadin x 5 days.  Daughter aware.  Information sent to breast center to set new date for procedure.  Fax: [phone]

## 2015-09-02 ENCOUNTER — Ambulatory Visit (INDEPENDENT_AMBULATORY_CARE_PROVIDER_SITE_OTHER): Payer: Medicare Other | Admitting: Pharmacist

## 2015-09-02 DIAGNOSIS — I359 Nonrheumatic aortic valve disorder, unspecified: Secondary | ICD-10-CM

## 2015-09-02 DIAGNOSIS — Z5181 Encounter for therapeutic drug level monitoring: Secondary | ICD-10-CM | POA: Diagnosis not present

## 2015-09-02 DIAGNOSIS — Z954 Presence of other heart-valve replacement: Secondary | ICD-10-CM | POA: Diagnosis not present

## 2015-09-02 DIAGNOSIS — Z952 Presence of prosthetic heart valve: Secondary | ICD-10-CM

## 2015-09-02 DIAGNOSIS — I6350 Cerebral infarction due to unspecified occlusion or stenosis of unspecified cerebral artery: Secondary | ICD-10-CM

## 2015-09-02 LAB — POCT INR: INR: 2.7

## 2015-09-04 ENCOUNTER — Ambulatory Visit (INDEPENDENT_AMBULATORY_CARE_PROVIDER_SITE_OTHER): Payer: Medicare Other | Admitting: Otolaryngology

## 2015-09-04 DIAGNOSIS — H903 Sensorineural hearing loss, bilateral: Secondary | ICD-10-CM

## 2015-09-11 ENCOUNTER — Ambulatory Visit
Admission: RE | Admit: 2015-09-11 | Discharge: 2015-09-11 | Disposition: A | Discharge status: Home / Self Care | Payer: Medicare Other | Source: Ambulatory Visit | Attending: Pulmonary Disease | Admitting: Pulmonary Disease

## 2015-09-11 DIAGNOSIS — N6011 Diffuse cystic mastopathy of right breast: Secondary | ICD-10-CM | POA: Diagnosis not present

## 2015-09-11 DIAGNOSIS — N6489 Other specified disorders of breast: Secondary | ICD-10-CM | POA: Diagnosis not present

## 2015-09-11 DIAGNOSIS — R928 Other abnormal and inconclusive findings on diagnostic imaging of breast: Secondary | ICD-10-CM | POA: Diagnosis not present

## 2015-09-17 ENCOUNTER — Telehealth: Payer: Self-pay | Admitting: *Deleted

## 2015-09-17 DIAGNOSIS — N63 Unspecified lump in breast: Secondary | ICD-10-CM | POA: Diagnosis not present

## 2015-09-17 DIAGNOSIS — I10 Essential (primary) hypertension: Secondary | ICD-10-CM | POA: Diagnosis not present

## 2015-09-17 DIAGNOSIS — J441 Chronic obstructive pulmonary disease with (acute) exacerbation: Secondary | ICD-10-CM | POA: Diagnosis not present

## 2015-09-17 DIAGNOSIS — M542 Cervicalgia: Secondary | ICD-10-CM | POA: Diagnosis not present

## 2015-09-17 NOTE — Telephone Encounter (Signed)
Spoke with Lupita LeashDonna pt's daughter and she states that [name] has seen Dr Juanetta GoslingHawkins and he is ordering Levaquin 500mg  daily for 10 days and Methylprednisolone 4mg  dose pak and Baclofen muscle relaxant due to cough and congestion and pain in her neck. She will start these meds today. Pt had held coumadin for 5 days for breast biopsy on October 27th and restarted coumadin at her same dose on October 27th. Pt's daughter instructed that the Levaquin may affect coumadin so appt made for her INR to be done on Monday November 7th and she states understanding.

## 2015-09-22 ENCOUNTER — Ambulatory Visit (INDEPENDENT_AMBULATORY_CARE_PROVIDER_SITE_OTHER): Payer: Medicare Other | Admitting: *Deleted

## 2015-09-22 DIAGNOSIS — Z954 Presence of other heart-valve replacement: Secondary | ICD-10-CM

## 2015-09-22 DIAGNOSIS — I359 Nonrheumatic aortic valve disorder, unspecified: Secondary | ICD-10-CM | POA: Diagnosis not present

## 2015-09-22 DIAGNOSIS — Z952 Presence of prosthetic heart valve: Secondary | ICD-10-CM

## 2015-09-22 DIAGNOSIS — I6350 Cerebral infarction due to unspecified occlusion or stenosis of unspecified cerebral artery: Secondary | ICD-10-CM

## 2015-09-22 DIAGNOSIS — Z5181 Encounter for therapeutic drug level monitoring: Secondary | ICD-10-CM | POA: Diagnosis not present

## 2015-09-22 LAB — POCT INR: INR: 2

## 2015-09-22 NOTE — Progress Notes (Signed)
HPI: FU coronary artery disease (s/p CABG), prior aortic valve replacement and aortic insufficiency. Last cardiac catheterization in 2011 revealed normal left main; 30% first diagonal, 50% LAD and 60% second diagonal. There was a 40-50% circumflex and a 40% right coronary artery. All grafts were occluded. Medical therapy recommended. Transesophageal echocardiogram in February of 2012 showed an ejection fraction of 45%. There was a St. Jude aortic valve with moderate perivalvular aortic insufficiency. There was moderate mitral regurgitation, moderate left atrial enlargement, mild right atrial enlargement and mild tricuspid regurgitation. Carotid Dopplers in July of 2016 showed 40-59% bilateral stenosis and followup recommended in one year. Echocardiogram November 2016 showed ejection fraction 35-40%, grade 1 diastolic dysfunction, mechanical aortic valve with moderate aortic insufficiency. Mean gradient 13 mmHg. Moderate mitral regurgitation, severe left atrial enlargement, mild to moderate tricuspid regurgitation. Since she was last seen, the patient denies any dyspnea on exertion, orthopnea, PND, pedal edema, palpitations, syncope or chest pain.   Current Outpatient Prescriptions  Medication Sig Dispense Refill  . amLODipine-benazepril (LOTREL) 5-20 MG per capsule TAKE ONE CAPSULE BY MOUTH ONCE DAILY. 30 capsule 6  . aspirin 81 MG EC tablet Take 81 mg by mouth daily.      Marland Kitchen. atorvastatin (LIPITOR) 40 MG tablet TAKE ONE TABLET BY MOUTH ONCE DAILY. 30 tablet 11  . donepezil (ARICEPT) 10 MG tablet TAKE ONE TABLET BY MOUTH ONCE DAILY. 30 tablet 0  . ergocalciferol (VITAMIN D2) 50000 UNITS capsule Take 50,000 Units by mouth once a week.     . ferrous sulfate 325 (65 FE) MG EC tablet Take 325 mg by mouth as needed.     . furosemide (LASIX) 20 MG tablet Take 1 tablet (20 mg total) by mouth daily. 30 tablet 9  . Melatonin 3 MG TABS Take 3 mg by mouth at bedtime.      . metoprolol tartrate (LOPRESSOR)  25 MG tablet TAKE (1/2) TABLET BY MOUTH TWICE DAILY. 30 tablet 10  . Multiple Vitamins-Minerals (VITEYES AREDS FORMULA PO) Take 1 tablet by mouth daily.    . potassium chloride (K-DUR,KLOR-CON) 10 MEQ tablet Take 1 tablet (10 mEq total) by mouth daily. 30 tablet 10  . pregabalin (LYRICA) 75 MG capsule Take 75 mg by mouth every other day. At bedtime     . traMADol (ULTRAM) 50 MG tablet as needed.    . traZODone (DESYREL) 50 MG tablet Take 1 tablet by mouth at bedtime as needed.    . warfarin (COUMADIN) 5 MG tablet Take 1 tablet by mouth daily or as directed by coumadin clinic 30 tablet 3   No current facility-administered medications for this visit.     Past Medical History  Diagnosis Date  . Carotid artery stenosis     Without infarction  . Stroke (HCC)   . Hypertension     Unspecified  . Coronary artery disease   . Hypercholesterolemia     Pure  . LBBB (left bundle branch block)   . Macular degeneration (senile) of retina, unspecified   . Unspecified vitamin D deficiency   . Postsurgical aortocoronary bypass status   . Unspecified hereditary and idiopathic peripheral neuropathy   . Transient global amnesia   . Heart valve replaced by other means     Past Surgical History  Procedure Laterality Date  . Aortic valve replacement    . Coronary artery bypass graft    . Appendectomy    . Tonsillectomy      Social History  Social History  . Marital Status: Widowed    Spouse Name: N/A  . Number of Children: 2  . Years of Education: 12th   Occupational History  .     Social History Main Topics  . Smoking status: Former Smoker -- 0.50 packs/day    Types: Cigarettes    Quit date: 11/30/1991  . Smokeless tobacco: Never Used     Comment: Tobacco use-no  . Alcohol Use: No  . Drug Use: No  . Sexual Activity: No   Other Topics Concern  . Not on file   Social History Narrative   Pt lives at home alone.   Caffeine Use: very rarely    ROS: no fevers or chills,  productive cough, hemoptysis, dysphasia, odynophagia, melena, hematochezia, dysuria, hematuria, rash, seizure activity, orthopnea, PND, pedal edema, claudication. Remaining systems are negative.  Physical Exam: Well-developed well-nourished in no acute distress.  Skin is warm and dry.  HEENT is normal.  Neck is supple.  Chest is clear to auscultation with normal expansion.  Cardiovascular exam is regular rate and rhythm. 2/6 systolic and diastolic murmur left sternal border. Abdominal exam nontender or distended. No masses palpated. Extremities show no edema. neuro grossly intact  ECG Sinus bradycardia, left bundle branch block.

## 2015-09-23 ENCOUNTER — Other Ambulatory Visit: Payer: Self-pay

## 2015-09-23 ENCOUNTER — Ambulatory Visit (HOSPITAL_COMMUNITY): Payer: Medicare Other | Attending: Cardiovascular Disease

## 2015-09-23 DIAGNOSIS — I517 Cardiomegaly: Secondary | ICD-10-CM | POA: Diagnosis not present

## 2015-09-23 DIAGNOSIS — I071 Rheumatic tricuspid insufficiency: Secondary | ICD-10-CM | POA: Insufficient documentation

## 2015-09-23 DIAGNOSIS — Z87891 Personal history of nicotine dependence: Secondary | ICD-10-CM | POA: Insufficient documentation

## 2015-09-23 DIAGNOSIS — I351 Nonrheumatic aortic (valve) insufficiency: Secondary | ICD-10-CM | POA: Insufficient documentation

## 2015-09-23 DIAGNOSIS — Z954 Presence of other heart-valve replacement: Secondary | ICD-10-CM

## 2015-09-23 DIAGNOSIS — I340 Nonrheumatic mitral (valve) insufficiency: Secondary | ICD-10-CM | POA: Insufficient documentation

## 2015-09-23 DIAGNOSIS — E785 Hyperlipidemia, unspecified: Secondary | ICD-10-CM | POA: Insufficient documentation

## 2015-09-23 DIAGNOSIS — Z952 Presence of prosthetic heart valve: Secondary | ICD-10-CM | POA: Diagnosis not present

## 2015-09-23 DIAGNOSIS — I447 Left bundle-branch block, unspecified: Secondary | ICD-10-CM | POA: Insufficient documentation

## 2015-09-23 DIAGNOSIS — I10 Essential (primary) hypertension: Secondary | ICD-10-CM | POA: Insufficient documentation

## 2015-09-23 DIAGNOSIS — I359 Nonrheumatic aortic valve disorder, unspecified: Secondary | ICD-10-CM | POA: Diagnosis present

## 2015-09-29 ENCOUNTER — Ambulatory Visit (INDEPENDENT_AMBULATORY_CARE_PROVIDER_SITE_OTHER): Payer: Medicare Other | Admitting: Cardiology

## 2015-09-29 ENCOUNTER — Encounter: Payer: Self-pay | Admitting: Cardiology

## 2015-09-29 VITALS — BP 140/42 | HR 58 | Ht 62.0 in | Wt 109.0 lb

## 2015-09-29 DIAGNOSIS — Z954 Presence of other heart-valve replacement: Secondary | ICD-10-CM | POA: Diagnosis not present

## 2015-09-29 DIAGNOSIS — I6350 Cerebral infarction due to unspecified occlusion or stenosis of unspecified cerebral artery: Secondary | ICD-10-CM | POA: Diagnosis not present

## 2015-09-29 DIAGNOSIS — Z952 Presence of prosthetic heart valve: Secondary | ICD-10-CM

## 2015-09-29 DIAGNOSIS — I10 Essential (primary) hypertension: Secondary | ICD-10-CM

## 2015-09-29 DIAGNOSIS — I420 Dilated cardiomyopathy: Secondary | ICD-10-CM

## 2015-09-29 DIAGNOSIS — I25700 Atherosclerosis of coronary artery bypass graft(s), unspecified, with unstable angina pectoris: Secondary | ICD-10-CM | POA: Diagnosis not present

## 2015-09-29 DIAGNOSIS — I6529 Occlusion and stenosis of unspecified carotid artery: Secondary | ICD-10-CM

## 2015-09-29 LAB — BASIC METABOLIC PANEL
BUN: 17 mg/dL (ref 7–25)
CHLORIDE: 104 mmol/L (ref 98–110)
CO2: 31 mmol/L (ref 20–31)
CREATININE: 0.96 mg/dL — AB (ref 0.60–0.88)
Calcium: 9.4 mg/dL (ref 8.6–10.4)
Glucose, Bld: 68 mg/dL (ref 65–99)
POTASSIUM: 4.3 mmol/L (ref 3.5–5.3)
Sodium: 143 mmol/L (ref 135–146)

## 2015-09-29 NOTE — Assessment & Plan Note (Signed)
Blood pressure controlled. Continue present medications. 

## 2015-09-29 NOTE — Assessment & Plan Note (Signed)
Continue ACE inhibitor and beta blocker. 

## 2015-09-29 NOTE — Assessment & Plan Note (Addendum)
Patient will need follow-up echoes in the future. Continue SBE prophylaxis. Aortic insufficiency is moderate. Continue Coumadin and aspirin. Check hemoglobin

## 2015-09-29 NOTE — Assessment & Plan Note (Signed)
Continue statin. 

## 2015-09-29 NOTE — Assessment & Plan Note (Signed)
Continue aspirin and statin. 

## 2015-09-29 NOTE — Patient Instructions (Signed)

## 2015-09-29 NOTE — Assessment & Plan Note (Signed)
Schedule follow-up carotid Dopplers July 2017. 

## 2015-10-03 DIAGNOSIS — Z952 Presence of prosthetic heart valve: Secondary | ICD-10-CM | POA: Diagnosis not present

## 2015-10-03 DIAGNOSIS — J449 Chronic obstructive pulmonary disease, unspecified: Secondary | ICD-10-CM | POA: Diagnosis not present

## 2015-10-03 DIAGNOSIS — I5020 Unspecified systolic (congestive) heart failure: Secondary | ICD-10-CM | POA: Diagnosis not present

## 2015-10-03 DIAGNOSIS — I10 Essential (primary) hypertension: Secondary | ICD-10-CM | POA: Diagnosis not present

## 2015-10-07 DIAGNOSIS — H353221 Exudative age-related macular degeneration, left eye, with active choroidal neovascularization: Secondary | ICD-10-CM | POA: Diagnosis not present

## 2015-10-13 ENCOUNTER — Ambulatory Visit (INDEPENDENT_AMBULATORY_CARE_PROVIDER_SITE_OTHER): Payer: Medicare Other

## 2015-10-13 DIAGNOSIS — Z5181 Encounter for therapeutic drug level monitoring: Secondary | ICD-10-CM

## 2015-10-13 DIAGNOSIS — Z954 Presence of other heart-valve replacement: Secondary | ICD-10-CM

## 2015-10-13 DIAGNOSIS — I359 Nonrheumatic aortic valve disorder, unspecified: Secondary | ICD-10-CM | POA: Diagnosis not present

## 2015-10-13 DIAGNOSIS — Z952 Presence of prosthetic heart valve: Secondary | ICD-10-CM

## 2015-10-13 DIAGNOSIS — I6350 Cerebral infarction due to unspecified occlusion or stenosis of unspecified cerebral artery: Secondary | ICD-10-CM

## 2015-10-13 LAB — POCT INR: INR: 2.6

## 2015-11-12 ENCOUNTER — Other Ambulatory Visit: Payer: Self-pay | Admitting: Cardiology

## 2015-11-12 NOTE — Telephone Encounter (Signed)
Rx request sent to pharmacy.  

## 2015-11-13 ENCOUNTER — Ambulatory Visit (INDEPENDENT_AMBULATORY_CARE_PROVIDER_SITE_OTHER): Payer: Medicare Other | Admitting: *Deleted

## 2015-11-13 DIAGNOSIS — Z5181 Encounter for therapeutic drug level monitoring: Secondary | ICD-10-CM

## 2015-11-13 DIAGNOSIS — I6350 Cerebral infarction due to unspecified occlusion or stenosis of unspecified cerebral artery: Secondary | ICD-10-CM | POA: Diagnosis not present

## 2015-11-13 DIAGNOSIS — Z954 Presence of other heart-valve replacement: Secondary | ICD-10-CM

## 2015-11-13 DIAGNOSIS — Z952 Presence of prosthetic heart valve: Secondary | ICD-10-CM

## 2015-11-13 DIAGNOSIS — I359 Nonrheumatic aortic valve disorder, unspecified: Secondary | ICD-10-CM | POA: Diagnosis not present

## 2015-11-13 LAB — POCT INR: INR: 3.3

## 2015-11-26 ENCOUNTER — Other Ambulatory Visit: Payer: Self-pay | Admitting: Cardiology

## 2015-12-04 ENCOUNTER — Ambulatory Visit (INDEPENDENT_AMBULATORY_CARE_PROVIDER_SITE_OTHER): Payer: Medicare Other | Admitting: *Deleted

## 2015-12-04 DIAGNOSIS — I359 Nonrheumatic aortic valve disorder, unspecified: Secondary | ICD-10-CM | POA: Diagnosis not present

## 2015-12-04 DIAGNOSIS — I6350 Cerebral infarction due to unspecified occlusion or stenosis of unspecified cerebral artery: Secondary | ICD-10-CM | POA: Diagnosis not present

## 2015-12-04 DIAGNOSIS — Z954 Presence of other heart-valve replacement: Secondary | ICD-10-CM

## 2015-12-04 DIAGNOSIS — Z5181 Encounter for therapeutic drug level monitoring: Secondary | ICD-10-CM | POA: Diagnosis not present

## 2015-12-04 DIAGNOSIS — Z952 Presence of prosthetic heart valve: Secondary | ICD-10-CM

## 2015-12-04 LAB — POCT INR: INR: 3.7

## 2015-12-09 DIAGNOSIS — H353132 Nonexudative age-related macular degeneration, bilateral, intermediate dry stage: Secondary | ICD-10-CM | POA: Diagnosis not present

## 2015-12-09 DIAGNOSIS — H35352 Cystoid macular degeneration, left eye: Secondary | ICD-10-CM | POA: Diagnosis not present

## 2015-12-09 DIAGNOSIS — H353221 Exudative age-related macular degeneration, left eye, with active choroidal neovascularization: Secondary | ICD-10-CM | POA: Diagnosis not present

## 2015-12-09 DIAGNOSIS — H35052 Retinal neovascularization, unspecified, left eye: Secondary | ICD-10-CM | POA: Diagnosis not present

## 2015-12-18 ENCOUNTER — Ambulatory Visit (INDEPENDENT_AMBULATORY_CARE_PROVIDER_SITE_OTHER): Payer: Medicare Other | Admitting: *Deleted

## 2015-12-18 DIAGNOSIS — Z952 Presence of prosthetic heart valve: Secondary | ICD-10-CM

## 2015-12-18 DIAGNOSIS — Z5181 Encounter for therapeutic drug level monitoring: Secondary | ICD-10-CM | POA: Diagnosis not present

## 2015-12-18 DIAGNOSIS — Z954 Presence of other heart-valve replacement: Secondary | ICD-10-CM

## 2015-12-18 LAB — POCT INR: INR: 1.9

## 2015-12-31 ENCOUNTER — Ambulatory Visit (INDEPENDENT_AMBULATORY_CARE_PROVIDER_SITE_OTHER): Payer: Medicare Other | Admitting: *Deleted

## 2015-12-31 DIAGNOSIS — Z5181 Encounter for therapeutic drug level monitoring: Secondary | ICD-10-CM | POA: Diagnosis not present

## 2015-12-31 DIAGNOSIS — Z952 Presence of prosthetic heart valve: Secondary | ICD-10-CM

## 2015-12-31 DIAGNOSIS — Z954 Presence of other heart-valve replacement: Secondary | ICD-10-CM | POA: Diagnosis not present

## 2015-12-31 LAB — POCT INR: INR: 2.3

## 2016-01-15 DIAGNOSIS — D649 Anemia, unspecified: Secondary | ICD-10-CM | POA: Diagnosis not present

## 2016-01-15 DIAGNOSIS — I10 Essential (primary) hypertension: Secondary | ICD-10-CM | POA: Diagnosis not present

## 2016-01-15 DIAGNOSIS — R5383 Other fatigue: Secondary | ICD-10-CM | POA: Diagnosis not present

## 2016-01-15 DIAGNOSIS — I5020 Unspecified systolic (congestive) heart failure: Secondary | ICD-10-CM | POA: Diagnosis not present

## 2016-01-22 ENCOUNTER — Ambulatory Visit (INDEPENDENT_AMBULATORY_CARE_PROVIDER_SITE_OTHER): Payer: Medicare Other

## 2016-01-22 DIAGNOSIS — Z5181 Encounter for therapeutic drug level monitoring: Secondary | ICD-10-CM | POA: Diagnosis not present

## 2016-01-22 DIAGNOSIS — Z954 Presence of other heart-valve replacement: Secondary | ICD-10-CM | POA: Diagnosis not present

## 2016-01-22 DIAGNOSIS — Z952 Presence of prosthetic heart valve: Secondary | ICD-10-CM

## 2016-01-22 LAB — POCT INR: INR: 2.1

## 2016-01-24 ENCOUNTER — Other Ambulatory Visit: Payer: Self-pay | Admitting: Cardiology

## 2016-01-26 NOTE — Telephone Encounter (Signed)
REFILL 

## 2016-02-04 ENCOUNTER — Other Ambulatory Visit: Payer: Self-pay | Admitting: Pulmonary Disease

## 2016-02-04 DIAGNOSIS — N6489 Other specified disorders of breast: Secondary | ICD-10-CM

## 2016-02-04 DIAGNOSIS — N63 Unspecified lump in unspecified breast: Secondary | ICD-10-CM

## 2016-02-05 ENCOUNTER — Other Ambulatory Visit: Payer: Self-pay | Admitting: Cardiology

## 2016-02-06 NOTE — Telephone Encounter (Signed)
REFILL 

## 2016-02-10 DIAGNOSIS — H353221 Exudative age-related macular degeneration, left eye, with active choroidal neovascularization: Secondary | ICD-10-CM | POA: Diagnosis not present

## 2016-02-17 DIAGNOSIS — Z961 Presence of intraocular lens: Secondary | ICD-10-CM | POA: Diagnosis not present

## 2016-02-19 ENCOUNTER — Ambulatory Visit (INDEPENDENT_AMBULATORY_CARE_PROVIDER_SITE_OTHER): Payer: Medicare Other

## 2016-02-19 DIAGNOSIS — Z5181 Encounter for therapeutic drug level monitoring: Secondary | ICD-10-CM | POA: Diagnosis not present

## 2016-02-19 DIAGNOSIS — Z954 Presence of other heart-valve replacement: Secondary | ICD-10-CM

## 2016-02-19 DIAGNOSIS — Z952 Presence of prosthetic heart valve: Secondary | ICD-10-CM

## 2016-02-19 LAB — POCT INR: INR: 2.3

## 2016-03-02 ENCOUNTER — Ambulatory Visit
Admission: RE | Admit: 2016-03-02 | Discharge: 2016-03-02 | Disposition: A | Discharge status: Home / Self Care | Payer: Medicare Other | Source: Ambulatory Visit | Attending: Pulmonary Disease | Admitting: Pulmonary Disease

## 2016-03-02 DIAGNOSIS — N6489 Other specified disorders of breast: Secondary | ICD-10-CM

## 2016-03-02 DIAGNOSIS — R928 Other abnormal and inconclusive findings on diagnostic imaging of breast: Secondary | ICD-10-CM | POA: Diagnosis not present

## 2016-03-18 ENCOUNTER — Ambulatory Visit (INDEPENDENT_AMBULATORY_CARE_PROVIDER_SITE_OTHER): Payer: Medicare Other | Admitting: *Deleted

## 2016-03-18 DIAGNOSIS — Z954 Presence of other heart-valve replacement: Secondary | ICD-10-CM | POA: Diagnosis not present

## 2016-03-18 DIAGNOSIS — Z952 Presence of prosthetic heart valve: Secondary | ICD-10-CM

## 2016-03-18 DIAGNOSIS — Z5181 Encounter for therapeutic drug level monitoring: Secondary | ICD-10-CM | POA: Diagnosis not present

## 2016-03-18 LAB — POCT INR: INR: 2.4

## 2016-03-22 ENCOUNTER — Other Ambulatory Visit: Payer: Self-pay | Admitting: Cardiology

## 2016-03-22 NOTE — Telephone Encounter (Signed)
Rx(s) sent to pharmacy electronically.  

## 2016-04-02 DIAGNOSIS — I10 Essential (primary) hypertension: Secondary | ICD-10-CM | POA: Diagnosis not present

## 2016-04-02 DIAGNOSIS — R42 Dizziness and giddiness: Secondary | ICD-10-CM | POA: Diagnosis not present

## 2016-04-05 ENCOUNTER — Ambulatory Visit (INDEPENDENT_AMBULATORY_CARE_PROVIDER_SITE_OTHER): Payer: Medicare Other | Admitting: Pharmacist

## 2016-04-05 DIAGNOSIS — Z5181 Encounter for therapeutic drug level monitoring: Secondary | ICD-10-CM | POA: Diagnosis not present

## 2016-04-05 DIAGNOSIS — Z952 Presence of prosthetic heart valve: Secondary | ICD-10-CM

## 2016-04-05 DIAGNOSIS — Z954 Presence of other heart-valve replacement: Secondary | ICD-10-CM

## 2016-04-05 LAB — POCT INR: INR: 4

## 2016-04-05 NOTE — Progress Notes (Signed)
HPI: FU coronary artery disease (s/p CABG), prior aortic valve replacement and aortic insufficiency. Last cardiac catheterization in 2011 revealed normal left main; 30% first diagonal, 50% LAD and 60% second diagonal. There was a 40-50% circumflex and a 40% right coronary artery. All grafts were occluded. Medical therapy recommended. Transesophageal echocardiogram in February of 2012 showed an ejection fraction of 45%. There was a St. Jude aortic valve with moderate perivalvular aortic insufficiency. There was moderate mitral regurgitation, moderate left atrial enlargement, mild right atrial enlargement and mild tricuspid regurgitation. Carotid Dopplers in July of 2016 showed 40-59% bilateral stenosis and followup recommended in one year. Echocardiogram November 2016 showed ejection fraction 35-40%, grade 1 diastolic dysfunction, mechanical aortic valve with moderate aortic insufficiency. Mean gradient 13 mmHg. Moderate mitral regurgitation, severe left atrial enlargement, mild to moderate tricuspid regurgitation. Since she was last seen, the patient has dyspnea with more extreme activities but not with routine activities. It is relieved with rest. It is not associated with chest pain. There is no orthopnea, PND or pedal edema. There is no syncope or palpitations. There is no exertional chest pain.   Current Outpatient Prescriptions  Medication Sig Dispense Refill  . amLODipine-benazepril (LOTREL) 5-20 MG capsule TAKE ONE CAPSULE BY MOUTH ONCE DAILY. 30 capsule 5  . aspirin 81 MG EC tablet Take 81 mg by mouth daily.      Marland Kitchen atorvastatin (LIPITOR) 40 MG tablet TAKE ONE TABLET BY MOUTH ONCE DAILY. 30 tablet 11  . donepezil (ARICEPT) 10 MG tablet TAKE ONE TABLET BY MOUTH ONCE DAILY. 30 tablet 0  . ergocalciferol (VITAMIN D2) 50000 UNITS capsule Take 50,000 Units by mouth once a week.     . ferrous sulfate 325 (65 FE) MG EC tablet Take 325 mg by mouth as needed.     . furosemide (LASIX) 20 MG tablet  Take 1 tablet (20 mg total) by mouth daily. 30 tablet 9  . meclizine (ANTIVERT) 25 MG tablet Take 25 mg by mouth 3 (three) times daily as needed for dizziness.    . Melatonin 3 MG TABS Take 3 mg by mouth at bedtime.      . methylPREDNISolone (MEDROL DOSEPAK) 4 MG TBPK tablet Take 4 mg by mouth. Starting 5/19 ending 5/24    . metoprolol tartrate (LOPRESSOR) 25 MG tablet Take 0.5 tablets (12.5 mg total) by mouth 2 (two) times daily. PLEASE KEEP UPCOMING APPOINTMENT 30 tablet 4  . Multiple Vitamins-Minerals (VITEYES AREDS FORMULA PO) Take 1 tablet by mouth daily.    . potassium chloride (K-DUR,KLOR-CON) 10 MEQ tablet TAKE ONE TABLET BY MOUTH ONCE DAILY. 30 tablet 1  . pregabalin (LYRICA) 75 MG capsule Take 75 mg by mouth every other day. At bedtime     . traMADol (ULTRAM) 50 MG tablet as needed.    . traZODone (DESYREL) 50 MG tablet Take 1 tablet by mouth at bedtime as needed.    . warfarin (COUMADIN) 5 MG tablet Take 1/2 to 1 tablet by mouth daily as directed by coumadin clinic 30 tablet 3   No current facility-administered medications for this visit.     Past Medical History  Diagnosis Date  . Carotid artery stenosis     Without infarction  . Stroke (HCC)   . Hypertension     Unspecified  . Coronary artery disease   . Hypercholesterolemia     Pure  . LBBB (left bundle branch block)   . Macular degeneration (senile) of retina, unspecified   .  Unspecified vitamin D deficiency   . Postsurgical aortocoronary bypass status   . Unspecified hereditary and idiopathic peripheral neuropathy   . Transient global amnesia   . Heart valve replaced by other means     Past Surgical History  Procedure Laterality Date  . Aortic valve replacement    . Coronary artery bypass graft    . Appendectomy    . Tonsillectomy      Social History   Social History  . Marital Status: Widowed    Spouse Name: N/A  . Number of Children: 2  . Years of Education: 12th   Occupational History  .      Social History Main Topics  . Smoking status: Former Smoker -- 0.50 packs/day    Types: Cigarettes    Quit date: 11/30/1991  . Smokeless tobacco: Never Used     Comment: Tobacco use-no  . Alcohol Use: No  . Drug Use: No  . Sexual Activity: No   Other Topics Concern  . Not on file   Social History Narrative   Pt lives at home alone.   Caffeine Use: very rarely    Family History  Problem Relation Age of Onset  . Heart attack Father   . Stroke Sister   . Neuropathy Brother   . COPD Sister     ROS: no fevers or chills, productive cough, hemoptysis, dysphasia, odynophagia, melena, hematochezia, dysuria, hematuria, rash, seizure activity, orthopnea, PND, pedal edema, claudication. Remaining systems are negative.  Physical Exam: Well-developed well-nourished in no acute distress.  Skin is warm and dry.  HEENT is normal.  Neck is supple.  Chest is clear to auscultation with normal expansion.  Cardiovascular exam is regular rate and rhythm. Crisp mechanicalValve sound with 2/6 diastolic murmur. Abdominal exam nontender or distended. No masses palpated. Extremities show no edema. neuro grossly intact  ECG Sinus rhythm, left bundle branch block.

## 2016-04-06 ENCOUNTER — Ambulatory Visit (INDEPENDENT_AMBULATORY_CARE_PROVIDER_SITE_OTHER): Payer: Medicare Other | Admitting: Cardiology

## 2016-04-06 ENCOUNTER — Encounter: Payer: Self-pay | Admitting: Cardiology

## 2016-04-06 VITALS — BP 142/56 | HR 60 | Ht 62.0 in | Wt 107.0 lb

## 2016-04-06 DIAGNOSIS — I420 Dilated cardiomyopathy: Secondary | ICD-10-CM | POA: Diagnosis not present

## 2016-04-06 DIAGNOSIS — I25700 Atherosclerosis of coronary artery bypass graft(s), unspecified, with unstable angina pectoris: Secondary | ICD-10-CM

## 2016-04-06 DIAGNOSIS — Z954 Presence of other heart-valve replacement: Secondary | ICD-10-CM | POA: Diagnosis not present

## 2016-04-06 DIAGNOSIS — I6529 Occlusion and stenosis of unspecified carotid artery: Secondary | ICD-10-CM

## 2016-04-06 DIAGNOSIS — I10 Essential (primary) hypertension: Secondary | ICD-10-CM | POA: Diagnosis not present

## 2016-04-06 DIAGNOSIS — I6350 Cerebral infarction due to unspecified occlusion or stenosis of unspecified cerebral artery: Secondary | ICD-10-CM | POA: Diagnosis not present

## 2016-04-06 DIAGNOSIS — Z952 Presence of prosthetic heart valve: Secondary | ICD-10-CM

## 2016-04-06 NOTE — Patient Instructions (Addendum)
Your physician has requested that you have a carotid duplex. This test is an ultrasound of the carotid arteries in your neck. It looks at blood flow through these arteries that supply the brain with blood. Allow one hour for this exam. There are no restrictions or special instructions.   Your physician wants you to follow-up in: 6 MONTHS WITH DR Jens SomRENSHAW You will receive a reminder letter in the mail two months in advance. If you don't receive a letter, please call our office to schedule the follow-up appointment.   If you need a refill on your cardiac medications before your next appointment, please call your pharmacy.

## 2016-04-06 NOTE — Assessment & Plan Note (Signed)
Follow-up carotid Dopplers July 2017. 

## 2016-04-06 NOTE — Assessment & Plan Note (Signed)
Continue ACE inhibitor and beta blocker. 

## 2016-04-06 NOTE — Assessment & Plan Note (Signed)
Continue statin. 

## 2016-04-06 NOTE — Assessment & Plan Note (Addendum)
Continue SBE prophylaxis.Follow-up echocardiogram November 2017. Continue Coumadin.

## 2016-04-06 NOTE — Assessment & Plan Note (Signed)
Continue aspirin and statin. 

## 2016-04-06 NOTE — Assessment & Plan Note (Addendum)
Blood pressure controlled. Continue present medications. 

## 2016-04-12 ENCOUNTER — Other Ambulatory Visit: Payer: Self-pay | Admitting: Cardiology

## 2016-04-15 DIAGNOSIS — J9611 Chronic respiratory failure with hypoxia: Secondary | ICD-10-CM | POA: Diagnosis not present

## 2016-04-15 DIAGNOSIS — J449 Chronic obstructive pulmonary disease, unspecified: Secondary | ICD-10-CM | POA: Diagnosis not present

## 2016-04-15 DIAGNOSIS — I10 Essential (primary) hypertension: Secondary | ICD-10-CM | POA: Diagnosis not present

## 2016-04-15 DIAGNOSIS — I5020 Unspecified systolic (congestive) heart failure: Secondary | ICD-10-CM | POA: Diagnosis not present

## 2016-04-20 ENCOUNTER — Ambulatory Visit (INDEPENDENT_AMBULATORY_CARE_PROVIDER_SITE_OTHER): Payer: Medicare Other

## 2016-04-20 DIAGNOSIS — Z5181 Encounter for therapeutic drug level monitoring: Secondary | ICD-10-CM | POA: Diagnosis not present

## 2016-04-20 DIAGNOSIS — Z954 Presence of other heart-valve replacement: Secondary | ICD-10-CM

## 2016-04-20 DIAGNOSIS — Z952 Presence of prosthetic heart valve: Secondary | ICD-10-CM

## 2016-04-20 LAB — POCT INR: INR: 2.6

## 2016-04-27 DIAGNOSIS — H353221 Exudative age-related macular degeneration, left eye, with active choroidal neovascularization: Secondary | ICD-10-CM | POA: Diagnosis not present

## 2016-05-03 ENCOUNTER — Other Ambulatory Visit: Payer: Self-pay | Admitting: Cardiology

## 2016-05-20 ENCOUNTER — Ambulatory Visit (INDEPENDENT_AMBULATORY_CARE_PROVIDER_SITE_OTHER): Payer: Medicare Other | Admitting: *Deleted

## 2016-05-20 DIAGNOSIS — Z5181 Encounter for therapeutic drug level monitoring: Secondary | ICD-10-CM

## 2016-05-20 DIAGNOSIS — Z954 Presence of other heart-valve replacement: Secondary | ICD-10-CM | POA: Diagnosis not present

## 2016-05-20 DIAGNOSIS — Z952 Presence of prosthetic heart valve: Secondary | ICD-10-CM

## 2016-05-20 LAB — POCT INR: INR: 2.8

## 2016-05-28 ENCOUNTER — Ambulatory Visit (HOSPITAL_COMMUNITY)
Admission: RE | Admit: 2016-05-28 | Discharge: 2016-05-28 | Disposition: A | Discharge status: Home / Self Care | Payer: Medicare Other | Source: Ambulatory Visit | Attending: Cardiovascular Disease | Admitting: Cardiovascular Disease

## 2016-05-28 DIAGNOSIS — I10 Essential (primary) hypertension: Secondary | ICD-10-CM | POA: Diagnosis not present

## 2016-05-28 DIAGNOSIS — E7800 Pure hypercholesterolemia, unspecified: Secondary | ICD-10-CM | POA: Insufficient documentation

## 2016-05-28 DIAGNOSIS — I6523 Occlusion and stenosis of bilateral carotid arteries: Secondary | ICD-10-CM

## 2016-05-28 DIAGNOSIS — I2510 Atherosclerotic heart disease of native coronary artery without angina pectoris: Secondary | ICD-10-CM | POA: Diagnosis not present

## 2016-05-28 DIAGNOSIS — I447 Left bundle-branch block, unspecified: Secondary | ICD-10-CM | POA: Insufficient documentation

## 2016-05-28 DIAGNOSIS — I6529 Occlusion and stenosis of unspecified carotid artery: Secondary | ICD-10-CM | POA: Diagnosis present

## 2016-06-03 ENCOUNTER — Other Ambulatory Visit: Payer: Self-pay

## 2016-06-03 ENCOUNTER — Other Ambulatory Visit: Payer: Self-pay | Admitting: Cardiology

## 2016-06-11 ENCOUNTER — Telehealth: Payer: Self-pay | Admitting: Orthopaedic Surgery

## 2016-06-17 ENCOUNTER — Telehealth: Payer: Self-pay | Admitting: *Deleted

## 2016-06-17 ENCOUNTER — Ambulatory Visit (INDEPENDENT_AMBULATORY_CARE_PROVIDER_SITE_OTHER): Payer: Medicare Other | Admitting: *Deleted

## 2016-06-17 DIAGNOSIS — Z5181 Encounter for therapeutic drug level monitoring: Secondary | ICD-10-CM

## 2016-06-17 DIAGNOSIS — Z952 Presence of prosthetic heart valve: Secondary | ICD-10-CM

## 2016-06-17 DIAGNOSIS — Z954 Presence of other heart-valve replacement: Secondary | ICD-10-CM

## 2016-06-17 LAB — POCT INR: INR: 3.7

## 2016-06-17 NOTE — Telephone Encounter (Signed)
Pharmacy sent in for refill request for Lyrica 75 mg capsule QTY 30 CAP

## 2016-06-21 MED ORDER — PREGABALIN 75 MG PO CAPS: 75.0000 mg | ORAL_CAPSULE | Freq: Every day | ORAL | 0 refills | Status: DC

## 2016-06-22 ENCOUNTER — Other Ambulatory Visit: Payer: Self-pay | Admitting: Pharmacist Clinician (PhC)/ Clinical Pharmacy Specialist

## 2016-06-22 ENCOUNTER — Other Ambulatory Visit: Payer: Self-pay | Admitting: Cardiology

## 2016-06-22 MED ORDER — WARFARIN SODIUM 5 MG PO TABS: ORAL_TABLET | ORAL | 3 refills | Status: DC

## 2016-06-25 ENCOUNTER — Other Ambulatory Visit: Payer: Self-pay | Admitting: Cardiology

## 2016-06-25 NOTE — Telephone Encounter (Signed)
REFILL 

## 2016-07-02 ENCOUNTER — Ambulatory Visit (INDEPENDENT_AMBULATORY_CARE_PROVIDER_SITE_OTHER): Payer: Medicare Other | Admitting: Pharmacist

## 2016-07-02 DIAGNOSIS — Z5181 Encounter for therapeutic drug level monitoring: Secondary | ICD-10-CM

## 2016-07-02 DIAGNOSIS — Z952 Presence of prosthetic heart valve: Secondary | ICD-10-CM

## 2016-07-02 DIAGNOSIS — Z954 Presence of other heart-valve replacement: Secondary | ICD-10-CM

## 2016-07-02 LAB — POCT INR: INR: 2.6

## 2016-07-14 DIAGNOSIS — H353221 Exudative age-related macular degeneration, left eye, with active choroidal neovascularization: Secondary | ICD-10-CM | POA: Diagnosis not present

## 2016-07-19 ENCOUNTER — Other Ambulatory Visit: Payer: Self-pay | Admitting: Cardiology

## 2016-07-22 DIAGNOSIS — Z23 Encounter for immunization: Secondary | ICD-10-CM | POA: Diagnosis not present

## 2016-07-22 DIAGNOSIS — I10 Essential (primary) hypertension: Secondary | ICD-10-CM | POA: Diagnosis not present

## 2016-07-22 DIAGNOSIS — J9611 Chronic respiratory failure with hypoxia: Secondary | ICD-10-CM | POA: Diagnosis not present

## 2016-07-22 DIAGNOSIS — J449 Chronic obstructive pulmonary disease, unspecified: Secondary | ICD-10-CM | POA: Diagnosis not present

## 2016-07-22 DIAGNOSIS — I5020 Unspecified systolic (congestive) heart failure: Secondary | ICD-10-CM | POA: Diagnosis not present

## 2016-07-29 ENCOUNTER — Ambulatory Visit (INDEPENDENT_AMBULATORY_CARE_PROVIDER_SITE_OTHER): Payer: Medicare Other | Admitting: Pharmacist

## 2016-07-29 DIAGNOSIS — Z5181 Encounter for therapeutic drug level monitoring: Secondary | ICD-10-CM

## 2016-07-29 DIAGNOSIS — Z954 Presence of other heart-valve replacement: Secondary | ICD-10-CM | POA: Diagnosis not present

## 2016-07-29 DIAGNOSIS — Z952 Presence of prosthetic heart valve: Secondary | ICD-10-CM

## 2016-07-29 LAB — POCT INR: INR: 3

## 2016-08-31 ENCOUNTER — Ambulatory Visit (INDEPENDENT_AMBULATORY_CARE_PROVIDER_SITE_OTHER): Payer: Medicare Other | Admitting: *Deleted

## 2016-08-31 DIAGNOSIS — Z952 Presence of prosthetic heart valve: Secondary | ICD-10-CM | POA: Diagnosis not present

## 2016-08-31 DIAGNOSIS — Z5181 Encounter for therapeutic drug level monitoring: Secondary | ICD-10-CM

## 2016-08-31 LAB — POCT INR: INR: 1.9

## 2016-09-02 ENCOUNTER — Ambulatory Visit (INDEPENDENT_AMBULATORY_CARE_PROVIDER_SITE_OTHER): Payer: Medicare Other | Admitting: Otolaryngology

## 2016-09-02 DIAGNOSIS — H903 Sensorineural hearing loss, bilateral: Secondary | ICD-10-CM

## 2016-09-07 DIAGNOSIS — B029 Zoster without complications: Secondary | ICD-10-CM | POA: Diagnosis not present

## 2016-09-16 ENCOUNTER — Ambulatory Visit (INDEPENDENT_AMBULATORY_CARE_PROVIDER_SITE_OTHER): Payer: Medicare Other | Admitting: *Deleted

## 2016-09-16 DIAGNOSIS — Z952 Presence of prosthetic heart valve: Secondary | ICD-10-CM

## 2016-09-16 DIAGNOSIS — Z5181 Encounter for therapeutic drug level monitoring: Secondary | ICD-10-CM

## 2016-09-16 LAB — POCT INR: INR: 2.4

## 2016-09-23 ENCOUNTER — Other Ambulatory Visit: Payer: Self-pay | Admitting: Cardiology

## 2016-09-29 DIAGNOSIS — H353211 Exudative age-related macular degeneration, right eye, with active choroidal neovascularization: Secondary | ICD-10-CM | POA: Diagnosis not present

## 2016-09-29 DIAGNOSIS — H353221 Exudative age-related macular degeneration, left eye, with active choroidal neovascularization: Secondary | ICD-10-CM | POA: Diagnosis not present

## 2016-09-29 DIAGNOSIS — H353132 Nonexudative age-related macular degeneration, bilateral, intermediate dry stage: Secondary | ICD-10-CM | POA: Diagnosis not present

## 2016-09-30 DIAGNOSIS — H353211 Exudative age-related macular degeneration, right eye, with active choroidal neovascularization: Secondary | ICD-10-CM | POA: Diagnosis not present

## 2016-10-05 ENCOUNTER — Ambulatory Visit (INDEPENDENT_AMBULATORY_CARE_PROVIDER_SITE_OTHER): Payer: Medicare Other | Admitting: *Deleted

## 2016-10-05 DIAGNOSIS — Z952 Presence of prosthetic heart valve: Secondary | ICD-10-CM | POA: Diagnosis not present

## 2016-10-05 DIAGNOSIS — Z5181 Encounter for therapeutic drug level monitoring: Secondary | ICD-10-CM | POA: Diagnosis not present

## 2016-10-05 DIAGNOSIS — I6350 Cerebral infarction due to unspecified occlusion or stenosis of unspecified cerebral artery: Secondary | ICD-10-CM

## 2016-10-05 LAB — POCT INR: INR: 2.7

## 2016-10-21 DIAGNOSIS — I5020 Unspecified systolic (congestive) heart failure: Secondary | ICD-10-CM | POA: Diagnosis not present

## 2016-10-21 DIAGNOSIS — J449 Chronic obstructive pulmonary disease, unspecified: Secondary | ICD-10-CM | POA: Diagnosis not present

## 2016-10-21 DIAGNOSIS — J9611 Chronic respiratory failure with hypoxia: Secondary | ICD-10-CM | POA: Diagnosis not present

## 2016-10-21 DIAGNOSIS — I10 Essential (primary) hypertension: Secondary | ICD-10-CM | POA: Diagnosis not present

## 2016-10-27 ENCOUNTER — Other Ambulatory Visit: Payer: Self-pay | Admitting: Cardiology

## 2016-11-02 ENCOUNTER — Ambulatory Visit (INDEPENDENT_AMBULATORY_CARE_PROVIDER_SITE_OTHER): Payer: Medicare Other | Admitting: *Deleted

## 2016-11-02 DIAGNOSIS — Z952 Presence of prosthetic heart valve: Secondary | ICD-10-CM | POA: Diagnosis not present

## 2016-11-02 DIAGNOSIS — I6350 Cerebral infarction due to unspecified occlusion or stenosis of unspecified cerebral artery: Secondary | ICD-10-CM

## 2016-11-02 DIAGNOSIS — Z5181 Encounter for therapeutic drug level monitoring: Secondary | ICD-10-CM

## 2016-11-02 LAB — POCT INR: INR: 2.7

## 2016-11-03 DIAGNOSIS — H353132 Nonexudative age-related macular degeneration, bilateral, intermediate dry stage: Secondary | ICD-10-CM | POA: Diagnosis not present

## 2016-11-03 DIAGNOSIS — H353211 Exudative age-related macular degeneration, right eye, with active choroidal neovascularization: Secondary | ICD-10-CM | POA: Diagnosis not present

## 2016-11-03 DIAGNOSIS — H353221 Exudative age-related macular degeneration, left eye, with active choroidal neovascularization: Secondary | ICD-10-CM | POA: Diagnosis not present

## 2016-11-03 DIAGNOSIS — H35352 Cystoid macular degeneration, left eye: Secondary | ICD-10-CM | POA: Diagnosis not present

## 2016-11-29 NOTE — Progress Notes (Signed)
HPI: FU coronary artery disease (s/p CABG), prior aortic valve replacement and aortic insufficiency. Last cardiac catheterization in 2011 revealed normal left main; 30% first diagonal, 50% LAD and 60% second diagonal. There was a 40-50% circumflex and a 40% right coronary artery. All grafts were occluded. Medical therapy recommended. Transesophageal echocardiogram in February of 2012 showed an ejection fraction of 45%. There was a St. Jude aortic valve with moderate perivalvular aortic insufficiency. There was moderate mitral regurgitation, moderate left atrial enlargement, mild right atrial enlargement and mild tricuspid regurgitation. Echocardiogram November 2016 showed ejection fraction 35-40%, grade 1 diastolic dysfunction, mechanical aortic valve with moderate aortic insufficiency. Mean gradient 13 mmHg. Moderate mitral regurgitation, severe left atrial enlargement, mild to moderate tricuspid regurgitation. Carotid dopplers 7/17 showed 40-59 bilateral stenosis. Since she was last seen, the patient has dyspnea with more extreme activities but not with routine activities. It is relieved with rest. It is not associated with chest pain. There is no orthopnea, PND or pedal edema. There is no syncope or palpitations. There is no exertional chest pain.   Current Outpatient Prescriptions  Medication Sig Dispense Refill  . amLODipine-benazepril (LOTREL) 5-20 MG capsule TAKE ONE CAPSULE BY MOUTH ONCE DAILY. 90 capsule 3  . aspirin 81 MG EC tablet Take 81 mg by mouth daily.      Marland Kitchen atorvastatin (LIPITOR) 40 MG tablet Take 1 tablet (40 mg total) by mouth daily. 30 tablet 5  . donepezil (ARICEPT) 10 MG tablet TAKE ONE TABLET BY MOUTH ONCE DAILY. 30 tablet 0  . ergocalciferol (VITAMIN D2) 50000 UNITS capsule Take 50,000 Units by mouth once a week.     . ferrous sulfate 325 (65 FE) MG EC tablet Take 325 mg by mouth as needed.     . furosemide (LASIX) 20 MG tablet TAKE ONE TABLET BY MOUTH ONCE DAILY. 30  tablet 4  . meclizine (ANTIVERT) 25 MG tablet Take 25 mg by mouth 3 (three) times daily as needed for dizziness.    . Melatonin 3 MG TABS Take 3 mg by mouth at bedtime.      . metoprolol tartrate (LOPRESSOR) 25 MG tablet TAKE (1/2) TABLET BY MOUTH TWICE DAILY. 30 tablet 3  . Multiple Vitamins-Minerals (VITEYES AREDS FORMULA PO) Take 1 tablet by mouth daily.    . potassium chloride (K-DUR) 10 MEQ tablet TAKE ONE TABLET BY MOUTH ONCE DAILY. 30 tablet 6  . pregabalin (LYRICA) 75 MG capsule Take 1 capsule (75 mg total) by mouth daily. 30 capsule 0  . traMADol (ULTRAM) 50 MG tablet as needed.    . traZODone (DESYREL) 50 MG tablet Take 1 tablet by mouth at bedtime as needed.    . warfarin (COUMADIN) 5 MG tablet TAKE 1/2 TO 1 TABLET DAILY AS DIRECTED BY COUMADIN CLINIC. 30 tablet 0   No current facility-administered medications for this visit.      Past Medical History:  Diagnosis Date  . Carotid artery stenosis    Without infarction  . Coronary artery disease   . Heart valve replaced by other means   . Hypercholesterolemia    Pure  . Hypertension    Unspecified  . LBBB (left bundle branch block)   . Macular degeneration (senile) of retina, unspecified   . Postsurgical aortocoronary bypass status   . Stroke (HCC)   . Transient global amnesia   . Unspecified hereditary and idiopathic peripheral neuropathy   . Unspecified vitamin D deficiency     Past Surgical History:  Procedure Laterality Date  . AORTIC VALVE REPLACEMENT    . APPENDECTOMY    . CORONARY ARTERY BYPASS GRAFT    . TONSILLECTOMY      Social History   Social History  . Marital status: Widowed    Spouse name: N/A  . Number of children: 2  . Years of education: 12th   Occupational History  .  Retired   Social History Main Topics  . Smoking status: Former Smoker    Packs/day: 0.50    Types: Cigarettes    Quit date: 11/30/1991  . Smokeless tobacco: Never Used     Comment: Tobacco use-no  . Alcohol use No  .  Drug use: No  . Sexual activity: No   Other Topics Concern  . Not on file   Social History Narrative   Pt lives at home alone.   Caffeine Use: very rarely    Family History  Problem Relation Age of Onset  . Heart attack Father   . Stroke Sister   . Neuropathy Brother   . COPD Sister     ROS: no fevers or chills, productive cough, hemoptysis, dysphasia, odynophagia, melena, hematochezia, dysuria, hematuria, rash, seizure activity, orthopnea, PND, pedal edema, claudication. Remaining systems are negative.  Physical Exam: Well-developed well-nourished in no acute distress.  Skin is warm and dry.  HEENT is normal.  Neck is supple.  Chest is clear to auscultation with normal expansion.  Cardiovascular exam is regular rate and rhythm. Crisp mechanical valve sound. 2/6 systolic and diastolic murmur left sternal border. Abdominal exam nontender or distended. No masses palpated. Extremities show no edema. neuro grossly intact  A/P  1 status post aortic valve replacement-continue SBE prophylaxis. Continue Coumadin. Schedule follow-up echocardiogram. Given age would like to be conservative if possible. Check Hgb.  2 dilated cardiomyopathy-continue ACE inhibitor and beta blocker.  3 hypertension-blood pressure controlled. Continue present medications. Check potassium and renal function.  4 hyperlipidemia-continue statin. Check lipids and liver.   5 carotid artery stenosis-follow-up carotid Dopplers July 2018.  6 coronary artery disease-continue aspirin and statin.  Olga MillersBrian Merik Mignano, MD

## 2016-11-30 ENCOUNTER — Ambulatory Visit (INDEPENDENT_AMBULATORY_CARE_PROVIDER_SITE_OTHER): Payer: Medicare Other | Admitting: *Deleted

## 2016-11-30 DIAGNOSIS — Z5181 Encounter for therapeutic drug level monitoring: Secondary | ICD-10-CM

## 2016-11-30 DIAGNOSIS — Z952 Presence of prosthetic heart valve: Secondary | ICD-10-CM | POA: Diagnosis not present

## 2016-11-30 LAB — POCT INR: INR: 2.8

## 2016-12-07 ENCOUNTER — Other Ambulatory Visit: Payer: Self-pay | Admitting: Cardiology

## 2016-12-07 ENCOUNTER — Ambulatory Visit (INDEPENDENT_AMBULATORY_CARE_PROVIDER_SITE_OTHER): Payer: Medicare Other | Admitting: Cardiology

## 2016-12-07 ENCOUNTER — Encounter: Payer: Self-pay | Admitting: Cardiology

## 2016-12-07 VITALS — BP 114/57 | HR 58 | Ht 62.0 in | Wt 103.0 lb

## 2016-12-07 DIAGNOSIS — E782 Mixed hyperlipidemia: Secondary | ICD-10-CM | POA: Diagnosis not present

## 2016-12-07 DIAGNOSIS — I6529 Occlusion and stenosis of unspecified carotid artery: Secondary | ICD-10-CM

## 2016-12-07 DIAGNOSIS — Z952 Presence of prosthetic heart valve: Secondary | ICD-10-CM

## 2016-12-07 DIAGNOSIS — I420 Dilated cardiomyopathy: Secondary | ICD-10-CM | POA: Diagnosis not present

## 2016-12-07 DIAGNOSIS — I25700 Atherosclerosis of coronary artery bypass graft(s), unspecified, with unstable angina pectoris: Secondary | ICD-10-CM

## 2016-12-07 DIAGNOSIS — I10 Essential (primary) hypertension: Secondary | ICD-10-CM

## 2016-12-07 DIAGNOSIS — I200 Unstable angina: Secondary | ICD-10-CM | POA: Diagnosis not present

## 2016-12-07 LAB — BASIC METABOLIC PANEL
BUN: 18 mg/dL (ref 7–25)
CALCIUM: 9.6 mg/dL (ref 8.6–10.4)
CO2: 30 mmol/L (ref 20–31)
Chloride: 107 mmol/L (ref 98–110)
Creat: 1.12 mg/dL — ABNORMAL HIGH (ref 0.60–0.88)
Glucose, Bld: 81 mg/dL (ref 65–99)
POTASSIUM: 4.9 mmol/L (ref 3.5–5.3)
SODIUM: 145 mmol/L (ref 135–146)

## 2016-12-07 LAB — CBC
HEMATOCRIT: 33.9 % — AB (ref 35.0–45.0)
HEMOGLOBIN: 11.1 g/dL — AB (ref 11.7–15.5)
MCH: 31.3 pg (ref 27.0–33.0)
MCHC: 32.7 g/dL (ref 32.0–36.0)
MCV: 95.5 fL (ref 80.0–100.0)
MPV: 10.8 fL (ref 7.5–12.5)
Platelets: 235 10*3/uL (ref 140–400)
RBC: 3.55 MIL/uL — AB (ref 3.80–5.10)
RDW: 13.3 % (ref 11.0–15.0)
WBC: 7.9 10*3/uL (ref 3.8–10.8)

## 2016-12-07 LAB — LIPID PANEL
CHOLESTEROL: 154 mg/dL (ref <200)
HDL: 78 mg/dL (ref >50)
LDL Cholesterol: 62 mg/dL (ref <100)
TRIGLYCERIDES: 68 mg/dL (ref <150)
Total CHOL/HDL Ratio: 2 Ratio (ref <5.0)
VLDL: 14 mg/dL (ref <30)

## 2016-12-07 LAB — HEPATIC FUNCTION PANEL
ALBUMIN: 3.8 g/dL (ref 3.6–5.1)
ALK PHOS: 62 U/L (ref 33–130)
ALT: 12 U/L (ref 6–29)
AST: 26 U/L (ref 10–35)
BILIRUBIN INDIRECT: 0.6 mg/dL (ref 0.2–1.2)
Bilirubin, Direct: 0.2 mg/dL (ref <=0.2)
TOTAL PROTEIN: 6.5 g/dL (ref 6.1–8.1)
Total Bilirubin: 0.8 mg/dL (ref 0.2–1.2)

## 2016-12-07 NOTE — Patient Instructions (Signed)
Medication Instructions:  Continue current medications  Labwork: CBC, BMP, Fasting Lipids Liver  Testing/Procedures: Your physician has requested that you have an echocardiogram. Echocardiography is a painless test that uses sound waves to create images of your heart. It provides your doctor with information about the size and shape of your heart and how well your heart's chambers and valves are working. This procedure takes approximately one hour. There are no restrictions for this procedure.  Your physician has requested that you have a carotid duplex in 6 Months. This test is an ultrasound of the carotid arteries in your neck. It looks at blood flow through these arteries that supply the brain with blood. Allow one hour for this exam. There are no restrictions or special instructions.  Follow-Up: Your physician wants you to follow-up in: 6 Months. You will receive a reminder letter in the mail two months in advance. If you don't receive a letter, please call our office to schedule the follow-up appointment.   Any Other Special Instructions Will Be Listed Below (If Applicable).   If you need a refill on your cardiac medications before your next appointment, please call your pharmacy.

## 2016-12-10 ENCOUNTER — Telehealth: Payer: Self-pay | Admitting: Cardiology

## 2016-12-10 DIAGNOSIS — H353211 Exudative age-related macular degeneration, right eye, with active choroidal neovascularization: Secondary | ICD-10-CM | POA: Diagnosis not present

## 2016-12-10 NOTE — Telephone Encounter (Signed)
New Message      Daughter needs a written copy of the blood work results from last week, does not have access to a computer so she can not print it out from my chart.

## 2016-12-10 NOTE — Telephone Encounter (Signed)
Returned call to patient's daughter [name] requesting a copy of recent lab work.Spoke to [name] she already mailed a copy of lab today.

## 2016-12-15 ENCOUNTER — Other Ambulatory Visit: Payer: Self-pay | Admitting: Cardiology

## 2016-12-21 ENCOUNTER — Other Ambulatory Visit (HOSPITAL_COMMUNITY): Payer: Medicare Other

## 2016-12-27 ENCOUNTER — Other Ambulatory Visit: Payer: Self-pay | Admitting: Cardiology

## 2016-12-28 NOTE — Telephone Encounter (Signed)
REFILL 

## 2016-12-29 DIAGNOSIS — H353221 Exudative age-related macular degeneration, left eye, with active choroidal neovascularization: Secondary | ICD-10-CM | POA: Diagnosis not present

## 2016-12-31 ENCOUNTER — Other Ambulatory Visit: Payer: Self-pay

## 2016-12-31 ENCOUNTER — Ambulatory Visit (HOSPITAL_COMMUNITY): Payer: Medicare Other | Attending: Cardiology

## 2016-12-31 DIAGNOSIS — I2510 Atherosclerotic heart disease of native coronary artery without angina pectoris: Secondary | ICD-10-CM | POA: Insufficient documentation

## 2016-12-31 DIAGNOSIS — I359 Nonrheumatic aortic valve disorder, unspecified: Secondary | ICD-10-CM | POA: Diagnosis present

## 2016-12-31 DIAGNOSIS — Z87891 Personal history of nicotine dependence: Secondary | ICD-10-CM | POA: Insufficient documentation

## 2016-12-31 DIAGNOSIS — I25700 Atherosclerosis of coronary artery bypass graft(s), unspecified, with unstable angina pectoris: Secondary | ICD-10-CM | POA: Diagnosis not present

## 2016-12-31 DIAGNOSIS — E785 Hyperlipidemia, unspecified: Secondary | ICD-10-CM | POA: Insufficient documentation

## 2016-12-31 DIAGNOSIS — I119 Hypertensive heart disease without heart failure: Secondary | ICD-10-CM | POA: Insufficient documentation

## 2016-12-31 DIAGNOSIS — I340 Nonrheumatic mitral (valve) insufficiency: Secondary | ICD-10-CM | POA: Insufficient documentation

## 2016-12-31 DIAGNOSIS — I361 Nonrheumatic tricuspid (valve) insufficiency: Secondary | ICD-10-CM | POA: Insufficient documentation

## 2016-12-31 DIAGNOSIS — I6529 Occlusion and stenosis of unspecified carotid artery: Secondary | ICD-10-CM | POA: Diagnosis not present

## 2016-12-31 DIAGNOSIS — I447 Left bundle-branch block, unspecified: Secondary | ICD-10-CM | POA: Insufficient documentation

## 2016-12-31 DIAGNOSIS — I352 Nonrheumatic aortic (valve) stenosis with insufficiency: Secondary | ICD-10-CM | POA: Insufficient documentation

## 2016-12-31 DIAGNOSIS — Z952 Presence of prosthetic heart valve: Secondary | ICD-10-CM | POA: Diagnosis not present

## 2016-12-31 DIAGNOSIS — I422 Other hypertrophic cardiomyopathy: Secondary | ICD-10-CM | POA: Diagnosis not present

## 2017-01-04 ENCOUNTER — Ambulatory Visit (INDEPENDENT_AMBULATORY_CARE_PROVIDER_SITE_OTHER): Payer: Medicare Other | Admitting: *Deleted

## 2017-01-04 DIAGNOSIS — Z952 Presence of prosthetic heart valve: Secondary | ICD-10-CM | POA: Diagnosis not present

## 2017-01-04 DIAGNOSIS — Z5181 Encounter for therapeutic drug level monitoring: Secondary | ICD-10-CM

## 2017-01-04 DIAGNOSIS — I6529 Occlusion and stenosis of unspecified carotid artery: Secondary | ICD-10-CM

## 2017-01-04 LAB — POCT INR: INR: 2.7

## 2017-01-06 ENCOUNTER — Telehealth: Payer: Self-pay | Admitting: Cardiology

## 2017-01-06 NOTE — Telephone Encounter (Signed)
Lupita LeashDonna (daughter) is calling because [name] is wanting to speak with Dr. Jens Somrenshaw about her Echo . Please call

## 2017-01-06 NOTE — Telephone Encounter (Signed)
Spoke with pt, questions regarding echo answered. Aware dr Jens Somcrenshaw in the hospital working the rest of the week. She is aware of follow up appointment. She will call back if she has further concerns.

## 2017-01-12 DIAGNOSIS — H353211 Exudative age-related macular degeneration, right eye, with active choroidal neovascularization: Secondary | ICD-10-CM | POA: Diagnosis not present

## 2017-01-12 DIAGNOSIS — H353132 Nonexudative age-related macular degeneration, bilateral, intermediate dry stage: Secondary | ICD-10-CM | POA: Diagnosis not present

## 2017-01-15 ENCOUNTER — Other Ambulatory Visit: Payer: Self-pay | Admitting: Cardiology

## 2017-01-31 ENCOUNTER — Telehealth: Payer: Self-pay | Admitting: Orthopaedic Surgery

## 2017-02-01 ENCOUNTER — Other Ambulatory Visit: Payer: Self-pay | Admitting: Orthopaedic Surgery

## 2017-02-01 NOTE — Telephone Encounter (Signed)
done

## 2017-02-02 DIAGNOSIS — J449 Chronic obstructive pulmonary disease, unspecified: Secondary | ICD-10-CM | POA: Diagnosis not present

## 2017-02-02 DIAGNOSIS — J9611 Chronic respiratory failure with hypoxia: Secondary | ICD-10-CM | POA: Diagnosis not present

## 2017-02-02 DIAGNOSIS — R42 Dizziness and giddiness: Secondary | ICD-10-CM | POA: Diagnosis not present

## 2017-02-02 DIAGNOSIS — I5020 Unspecified systolic (congestive) heart failure: Secondary | ICD-10-CM | POA: Diagnosis not present

## 2017-02-15 ENCOUNTER — Ambulatory Visit (INDEPENDENT_AMBULATORY_CARE_PROVIDER_SITE_OTHER): Payer: Medicare Other | Admitting: *Deleted

## 2017-02-15 DIAGNOSIS — I6529 Occlusion and stenosis of unspecified carotid artery: Secondary | ICD-10-CM

## 2017-02-15 DIAGNOSIS — Z5181 Encounter for therapeutic drug level monitoring: Secondary | ICD-10-CM | POA: Diagnosis not present

## 2017-02-15 DIAGNOSIS — Z952 Presence of prosthetic heart valve: Secondary | ICD-10-CM

## 2017-02-15 LAB — POCT INR: INR: 2.2

## 2017-02-17 DIAGNOSIS — H35721 Serous detachment of retinal pigment epithelium, right eye: Secondary | ICD-10-CM | POA: Diagnosis not present

## 2017-02-17 DIAGNOSIS — H353211 Exudative age-related macular degeneration, right eye, with active choroidal neovascularization: Secondary | ICD-10-CM | POA: Diagnosis not present

## 2017-02-22 ENCOUNTER — Ambulatory Visit: Payer: Medicare Other | Admitting: Cardiology

## 2017-02-22 DIAGNOSIS — H52203 Unspecified astigmatism, bilateral: Secondary | ICD-10-CM | POA: Diagnosis not present

## 2017-02-22 DIAGNOSIS — Z961 Presence of intraocular lens: Secondary | ICD-10-CM | POA: Diagnosis not present

## 2017-02-22 DIAGNOSIS — H353230 Exudative age-related macular degeneration, bilateral, stage unspecified: Secondary | ICD-10-CM | POA: Diagnosis not present

## 2017-02-22 DIAGNOSIS — H524 Presbyopia: Secondary | ICD-10-CM | POA: Diagnosis not present

## 2017-02-22 DIAGNOSIS — H5213 Myopia, bilateral: Secondary | ICD-10-CM | POA: Diagnosis not present

## 2017-02-28 ENCOUNTER — Telehealth: Payer: Self-pay | Admitting: Pharmacist

## 2017-02-28 NOTE — Telephone Encounter (Signed)
Pt's daughter called and reported that patient started Levaquin on Saturday and prednisone today for a cough. Scheduled her for INR check on 4/18.

## 2017-03-02 ENCOUNTER — Ambulatory Visit (INDEPENDENT_AMBULATORY_CARE_PROVIDER_SITE_OTHER): Payer: Medicare Other | Admitting: *Deleted

## 2017-03-02 DIAGNOSIS — I6529 Occlusion and stenosis of unspecified carotid artery: Secondary | ICD-10-CM

## 2017-03-02 DIAGNOSIS — Z5181 Encounter for therapeutic drug level monitoring: Secondary | ICD-10-CM

## 2017-03-02 DIAGNOSIS — Z952 Presence of prosthetic heart valve: Secondary | ICD-10-CM

## 2017-03-02 LAB — POCT INR: INR: 3.2

## 2017-03-10 ENCOUNTER — Other Ambulatory Visit (HOSPITAL_COMMUNITY): Payer: Self-pay | Admitting: Pulmonary Disease

## 2017-03-10 ENCOUNTER — Ambulatory Visit (HOSPITAL_COMMUNITY)
Admission: RE | Admit: 2017-03-10 | Discharge: 2017-03-10 | Disposition: A | Discharge status: Home / Self Care | Payer: Medicare Other | Source: Ambulatory Visit | Attending: Pulmonary Disease | Admitting: Pulmonary Disease

## 2017-03-10 DIAGNOSIS — R05 Cough: Secondary | ICD-10-CM

## 2017-03-10 DIAGNOSIS — J309 Allergic rhinitis, unspecified: Secondary | ICD-10-CM | POA: Diagnosis not present

## 2017-03-10 DIAGNOSIS — I5020 Unspecified systolic (congestive) heart failure: Secondary | ICD-10-CM | POA: Diagnosis not present

## 2017-03-10 DIAGNOSIS — J438 Other emphysema: Secondary | ICD-10-CM | POA: Insufficient documentation

## 2017-03-10 DIAGNOSIS — R059 Cough, unspecified: Secondary | ICD-10-CM

## 2017-03-10 DIAGNOSIS — I517 Cardiomegaly: Secondary | ICD-10-CM | POA: Insufficient documentation

## 2017-03-10 DIAGNOSIS — J441 Chronic obstructive pulmonary disease with (acute) exacerbation: Secondary | ICD-10-CM | POA: Diagnosis not present

## 2017-03-10 DIAGNOSIS — J9611 Chronic respiratory failure with hypoxia: Secondary | ICD-10-CM | POA: Diagnosis not present

## 2017-03-11 ENCOUNTER — Telehealth: Payer: Self-pay | Admitting: Cardiology

## 2017-03-11 NOTE — Telephone Encounter (Signed)
Spoke with daughter [name] she states that pt is still sick and she took her to the PCP yesterday and he startyed her on doxycyline  BID for 10 days and her INR was 3.1 and PT was 31.2-yesterday. She has not taken her coumadin yet today she states that she takes it at suppertime. She just finished her Levaquin on Sunday and her prednisone on Tuesday. She would like to know what her dosing should be today with the antibiotic and INR level. Pt took5mg  yesterday and is scheduled to take 2.5mg  today at supper. Continue with this dosing? Please advise

## 2017-03-11 NOTE — Telephone Encounter (Signed)
Appt scheduled for ch st anticoag

## 2017-03-11 NOTE — Telephone Encounter (Signed)
Please call patient and have her skip warfarin today, then continue with normal routine.  Schedule her to go to coumadin clinic at [facility] on Tuesday (there are about 4 times open)

## 2017-03-11 NOTE — Telephone Encounter (Signed)
New message      Pt had her pt/inr checked yesterday at her PCP's office.  The PCP started her on docycycline  for 10 days.  Her INR was 3.1 and PT was 31.2.  Daughter says patient's range is 2.2-3.0.  Since pt is on an antibiotic, will she need to adjust her coumadin?  Daughter aware coumadin clinic staff is out of office but she is concerned since the weekend is here.  Please advise

## 2017-03-11 NOTE — Telephone Encounter (Signed)
Left detailed message(DPR) to call back and schedule her to go to coumadin clinic at [facility] on Tuesday.

## 2017-03-15 ENCOUNTER — Ambulatory Visit (INDEPENDENT_AMBULATORY_CARE_PROVIDER_SITE_OTHER): Payer: Medicare Other

## 2017-03-15 DIAGNOSIS — Z952 Presence of prosthetic heart valve: Secondary | ICD-10-CM | POA: Diagnosis not present

## 2017-03-15 DIAGNOSIS — Z5181 Encounter for therapeutic drug level monitoring: Secondary | ICD-10-CM

## 2017-03-15 DIAGNOSIS — I6529 Occlusion and stenosis of unspecified carotid artery: Secondary | ICD-10-CM | POA: Diagnosis not present

## 2017-03-15 LAB — POCT INR: INR: 3.1

## 2017-03-15 NOTE — Progress Notes (Signed)
HPI: FU coronary artery disease (s/p CABG), prior aortic valve replacement and aortic insufficiency. Last cardiac catheterization in 2011 revealed normal left main; 30% first diagonal, 50% LAD and 60% second diagonal. There was a 40-50% circumflex and a 40% right coronary artery. All grafts were occluded. Medical therapy recommended. Transesophageal echocardiogram in February of 2012 showed an ejection fraction of 45%. There was a St. Jude aortic valve with moderate perivalvular aortic insufficiency. There was moderate mitral regurgitation, moderate left atrial enlargement, mild right atrial enlargement and mild tricuspid regurgitation. Carotid dopplers 7/17 showed 40-59 bilateral stenosis. Last echo 2/18 showed EF 30-35; grade 1 DD, moderate AS (mean gradient 23 mmHg) and moderate AI, mild MS, mild MR; moderate LAE; moderate TR with severely elevated pulmonary pressure. Since she was last seen, she is getting over a recent viral infection. She has had some dyspnea on exertion but no orthopnea, PND, pedal edema, chest pain, bleeding or syncope.    Current Outpatient Prescriptions  Medication Sig Dispense Refill  . amLODipine-benazepril (LOTREL) 5-20 MG capsule TAKE ONE CAPSULE BY MOUTH ONCE DAILY. 90 capsule 3  . aspirin 81 MG EC tablet Take 81 mg by mouth daily.      Marland Kitchen atorvastatin (LIPITOR) 40 MG tablet Take 1 tablet (40 mg total) by mouth daily. 30 tablet 5  . donepezil (ARICEPT) 10 MG tablet TAKE ONE TABLET BY MOUTH ONCE DAILY. 30 tablet 0  . ergocalciferol (VITAMIN D2) 50000 UNITS capsule Take 50,000 Units by mouth once a week.     . ferrous sulfate 325 (65 FE) MG EC tablet Take 325 mg by mouth as needed.     . furosemide (LASIX) 20 MG tablet TAKE ONE TABLET BY MOUTH ONCE DAILY. 30 tablet 11  . LYRICA 75 MG capsule TAKE ONE CAPSULE BY MOUTH ONCE DAILY. 30 capsule 0  . meclizine (ANTIVERT) 25 MG tablet Take 25 mg by mouth 3 (three) times daily as needed for dizziness.    . Melatonin 3 MG  TABS Take 3 mg by mouth at bedtime.      . metoprolol tartrate (LOPRESSOR) 25 MG tablet TAKE (1/2) TABLET BY MOUTH TWICE DAILY. 30 tablet 11  . Multiple Vitamins-Minerals (VITEYES AREDS FORMULA PO) Take 1 tablet by mouth daily.    . potassium chloride (K-DUR) 10 MEQ tablet TAKE ONE TABLET BY MOUTH ONCE DAILY. 30 tablet 6  . traMADol (ULTRAM) 50 MG tablet as needed.    . traZODone (DESYREL) 50 MG tablet Take 1 tablet by mouth at bedtime as needed.    . warfarin (COUMADIN) 5 MG tablet TAKE 1/2 TO 1 TABLET DAILY AS DIRECTED BY COUMADIN CLINIC. 30 tablet 5   No current facility-administered medications for this visit.      Past Medical History:  Diagnosis Date  . Carotid artery stenosis    Without infarction  . Coronary artery disease   . Heart valve replaced by other means   . Hypercholesterolemia    Pure  . Hypertension    Unspecified  . LBBB (left bundle branch block)   . Macular degeneration (senile) of retina, unspecified   . Postsurgical aortocoronary bypass status   . Stroke (HCC)   . Transient global amnesia   . Unspecified hereditary and idiopathic peripheral neuropathy   . Unspecified vitamin D deficiency     Past Surgical History:  Procedure Laterality Date  . AORTIC VALVE REPLACEMENT    . APPENDECTOMY    . CORONARY ARTERY BYPASS GRAFT    .  TONSILLECTOMY      Social History   Social History  . Marital status: Widowed    Spouse name: N/A  . Number of children: 2  . Years of education: 12th   Occupational History  .  Retired   Social History Main Topics  . Smoking status: Former Smoker    Packs/day: 0.50    Types: Cigarettes    Quit date: 11/30/1991  . Smokeless tobacco: Never Used     Comment: Tobacco use-no  . Alcohol use No  . Drug use: No  . Sexual activity: No   Other Topics Concern  . Not on file   Social History Narrative   Pt lives at home alone.   Caffeine Use: very rarely    Family History  Problem Relation Age of Onset  . Heart  attack Father   . Stroke Sister   . Neuropathy Brother   . COPD Sister     ROS: no fevers or chills, productive cough, hemoptysis, dysphasia, odynophagia, melena, hematochezia, dysuria, hematuria, rash, seizure activity, orthopnea, PND, pedal edema, claudication. Remaining systems are negative.  Physical Exam: Well-developed well-nourished in no acute distress.  Skin is warm and dry.  HEENT is normal.  Neck is supple.  Chest is clear to auscultation with normal expansion.  Cardiovascular exam is regular rate and rhythm. Crisp mechanical valve sound. 2/6 diastolic murmur left sternal border. Abdominal exam nontender or distended. No masses palpated. Extremities show no edema. neuro grossly intact  ECG- sinus rhythm at a rate of 61. Left bundle branch block. personally reviewed  A/P  1 status post aortic valve replacement-continue SBE prophylaxis. Continue Coumadin. Given age would like to be conservative if possible.   2 dilated cardiomyopathy-LV function is worse compared to previous. Even a job would like to be conservative. Discontinue Lotrel and increase Lotensin to 40 mg daily. Discontinue metoprolol and add carvedilol 3.125 mg twice a day. Titrate medications as tolerated. Check potassium and renal function in 1 week.   3 hypertension-blood pressure controlled. Adjustments as outlined above.  4 hyperlipidemia-continue statin.    5 carotid artery stenosis-follow-up carotid Dopplers July 2018.  6 coronary artery disease-continue aspirin and statin.  [name], MD

## 2017-03-16 DIAGNOSIS — I10 Essential (primary) hypertension: Secondary | ICD-10-CM | POA: Diagnosis not present

## 2017-03-16 DIAGNOSIS — J9611 Chronic respiratory failure with hypoxia: Secondary | ICD-10-CM | POA: Diagnosis not present

## 2017-03-16 DIAGNOSIS — I5020 Unspecified systolic (congestive) heart failure: Secondary | ICD-10-CM | POA: Diagnosis not present

## 2017-03-16 DIAGNOSIS — J449 Chronic obstructive pulmonary disease, unspecified: Secondary | ICD-10-CM | POA: Diagnosis not present

## 2017-03-21 DIAGNOSIS — H353211 Exudative age-related macular degeneration, right eye, with active choroidal neovascularization: Secondary | ICD-10-CM | POA: Diagnosis not present

## 2017-03-21 DIAGNOSIS — H353132 Nonexudative age-related macular degeneration, bilateral, intermediate dry stage: Secondary | ICD-10-CM | POA: Diagnosis not present

## 2017-03-21 DIAGNOSIS — H353221 Exudative age-related macular degeneration, left eye, with active choroidal neovascularization: Secondary | ICD-10-CM | POA: Diagnosis not present

## 2017-03-21 DIAGNOSIS — H35352 Cystoid macular degeneration, left eye: Secondary | ICD-10-CM | POA: Diagnosis not present

## 2017-03-22 ENCOUNTER — Encounter: Payer: Self-pay | Admitting: Cardiology

## 2017-03-22 ENCOUNTER — Ambulatory Visit (INDEPENDENT_AMBULATORY_CARE_PROVIDER_SITE_OTHER): Payer: Medicare Other | Admitting: Pharmacist

## 2017-03-22 ENCOUNTER — Ambulatory Visit (INDEPENDENT_AMBULATORY_CARE_PROVIDER_SITE_OTHER): Payer: Medicare Other | Admitting: Cardiology

## 2017-03-22 VITALS — BP 114/60 | HR 61 | Ht 61.0 in | Wt 101.0 lb

## 2017-03-22 DIAGNOSIS — Z952 Presence of prosthetic heart valve: Secondary | ICD-10-CM | POA: Diagnosis not present

## 2017-03-22 DIAGNOSIS — I6529 Occlusion and stenosis of unspecified carotid artery: Secondary | ICD-10-CM

## 2017-03-22 DIAGNOSIS — Z5181 Encounter for therapeutic drug level monitoring: Secondary | ICD-10-CM

## 2017-03-22 DIAGNOSIS — I10 Essential (primary) hypertension: Secondary | ICD-10-CM | POA: Diagnosis not present

## 2017-03-22 DIAGNOSIS — I25810 Atherosclerosis of coronary artery bypass graft(s) without angina pectoris: Secondary | ICD-10-CM | POA: Diagnosis not present

## 2017-03-22 DIAGNOSIS — I420 Dilated cardiomyopathy: Secondary | ICD-10-CM | POA: Diagnosis not present

## 2017-03-22 LAB — POCT INR: INR: 1.5

## 2017-03-22 MED ORDER — CARVEDILOL 3.125 MG PO TABS: 3.1250 mg | ORAL_TABLET | Freq: Two times a day (BID) | ORAL | 3 refills | Status: DC

## 2017-03-22 MED ORDER — BENAZEPRIL HCL 40 MG PO TABS: 40.0000 mg | ORAL_TABLET | Freq: Every day | ORAL | 3 refills | Status: DC

## 2017-03-22 NOTE — Patient Instructions (Signed)
Medication Instructions:   STOP LOTREL  STOP METOPROLOL  START BENAZEPRIL 40 MG ONCE DAILY  START CARVEDILOL 3.125 MG ONE TABLET TWICE DAILY  Labwork:  Your physician recommends that you return for lab work in: ONE WEEK  Follow-Up:  Your physician recommends that you schedule a follow-up appointment in: 3 MONTHS WITH DR Jens SomRENSHAW   If you need a refill on your cardiac medications before your next appointment, please call your pharmacy.

## 2017-03-23 ENCOUNTER — Other Ambulatory Visit: Payer: Self-pay | Admitting: Cardiology

## 2017-03-23 NOTE — Telephone Encounter (Signed)
Rx(s) sent to pharmacy electronically.  

## 2017-03-29 ENCOUNTER — Telehealth: Payer: Self-pay | Admitting: Cardiology

## 2017-03-29 DIAGNOSIS — I25810 Atherosclerosis of coronary artery bypass graft(s) without angina pectoris: Secondary | ICD-10-CM | POA: Diagnosis not present

## 2017-03-29 NOTE — Telephone Encounter (Signed)
New message    Pt daughter is calling to report blood presures for pt. Pt daughter also states that pt had blood drawn this morning in [place].  5/8-123/50  5/9-am-149/117 3:45pm-121/54 8pm-139/53  5/10-143/80  5/11-am-114/52 afternoon-125/79 evening-124/57 5/12-am-119/59 pm-124/78 5/13-am-130/70 pm-115/48  5/14-am-113/55 pm-129/56 5/15-am-133/59 am-119/56

## 2017-03-29 NOTE — Telephone Encounter (Signed)
BP ok Continue present meds Olga MillersBrian Crenshaw

## 2017-03-29 NOTE — Addendum Note (Signed)
Addended by: Beryle QuantBENSHINE, TARA L on: 03/29/2017 09:33 AM   Modules accepted: Orders

## 2017-03-30 DIAGNOSIS — H353222 Exudative age-related macular degeneration, left eye, with inactive choroidal neovascularization: Secondary | ICD-10-CM | POA: Diagnosis not present

## 2017-03-30 DIAGNOSIS — H35052 Retinal neovascularization, unspecified, left eye: Secondary | ICD-10-CM | POA: Diagnosis not present

## 2017-03-30 DIAGNOSIS — H35352 Cystoid macular degeneration, left eye: Secondary | ICD-10-CM | POA: Diagnosis not present

## 2017-03-30 DIAGNOSIS — H353211 Exudative age-related macular degeneration, right eye, with active choroidal neovascularization: Secondary | ICD-10-CM | POA: Diagnosis not present

## 2017-03-30 DIAGNOSIS — H353132 Nonexudative age-related macular degeneration, bilateral, intermediate dry stage: Secondary | ICD-10-CM | POA: Diagnosis not present

## 2017-03-30 DIAGNOSIS — H35362 Drusen (degenerative) of macula, left eye: Secondary | ICD-10-CM | POA: Diagnosis not present

## 2017-03-30 LAB — BASIC METABOLIC PANEL
BUN: 20 mg/dL (ref 7–25)
CALCIUM: 9.3 mg/dL (ref 8.6–10.4)
CO2: 30 mmol/L (ref 20–31)
CREATININE: 1.08 mg/dL — AB (ref 0.60–0.88)
Chloride: 102 mmol/L (ref 98–110)
Glucose, Bld: 73 mg/dL (ref 65–99)
Potassium: 5.2 mmol/L (ref 3.5–5.3)
Sodium: 142 mmol/L (ref 135–146)

## 2017-03-30 NOTE — Telephone Encounter (Signed)
Spoke with pt, Aware of dr crenshaw's recommendations.  °

## 2017-04-05 ENCOUNTER — Ambulatory Visit (INDEPENDENT_AMBULATORY_CARE_PROVIDER_SITE_OTHER): Payer: Medicare Other

## 2017-04-05 DIAGNOSIS — I6529 Occlusion and stenosis of unspecified carotid artery: Secondary | ICD-10-CM

## 2017-04-05 DIAGNOSIS — Z952 Presence of prosthetic heart valve: Secondary | ICD-10-CM

## 2017-04-05 DIAGNOSIS — Z5181 Encounter for therapeutic drug level monitoring: Secondary | ICD-10-CM | POA: Diagnosis not present

## 2017-04-05 LAB — POCT INR: INR: 2

## 2017-04-08 ENCOUNTER — Telehealth: Payer: Self-pay | Admitting: Orthopaedic Surgery

## 2017-04-19 ENCOUNTER — Ambulatory Visit (INDEPENDENT_AMBULATORY_CARE_PROVIDER_SITE_OTHER): Payer: Medicare Other

## 2017-04-19 DIAGNOSIS — Z952 Presence of prosthetic heart valve: Secondary | ICD-10-CM

## 2017-04-19 DIAGNOSIS — Z5181 Encounter for therapeutic drug level monitoring: Secondary | ICD-10-CM

## 2017-04-19 DIAGNOSIS — I6529 Occlusion and stenosis of unspecified carotid artery: Secondary | ICD-10-CM

## 2017-04-19 LAB — POCT INR: INR: 2.6

## 2017-04-25 DIAGNOSIS — H359 Unspecified retinal disorder: Secondary | ICD-10-CM | POA: Diagnosis not present

## 2017-04-25 DIAGNOSIS — H353222 Exudative age-related macular degeneration, left eye, with inactive choroidal neovascularization: Secondary | ICD-10-CM | POA: Diagnosis not present

## 2017-04-25 DIAGNOSIS — H353211 Exudative age-related macular degeneration, right eye, with active choroidal neovascularization: Secondary | ICD-10-CM | POA: Diagnosis not present

## 2017-04-25 DIAGNOSIS — H35721 Serous detachment of retinal pigment epithelium, right eye: Secondary | ICD-10-CM | POA: Diagnosis not present

## 2017-05-05 DIAGNOSIS — Z952 Presence of prosthetic heart valve: Secondary | ICD-10-CM | POA: Diagnosis not present

## 2017-05-05 DIAGNOSIS — J449 Chronic obstructive pulmonary disease, unspecified: Secondary | ICD-10-CM | POA: Diagnosis not present

## 2017-05-05 DIAGNOSIS — I5020 Unspecified systolic (congestive) heart failure: Secondary | ICD-10-CM | POA: Diagnosis not present

## 2017-05-05 DIAGNOSIS — J9611 Chronic respiratory failure with hypoxia: Secondary | ICD-10-CM | POA: Diagnosis not present

## 2017-05-17 ENCOUNTER — Ambulatory Visit (INDEPENDENT_AMBULATORY_CARE_PROVIDER_SITE_OTHER): Payer: Medicare Other | Admitting: *Deleted

## 2017-05-17 DIAGNOSIS — I6529 Occlusion and stenosis of unspecified carotid artery: Secondary | ICD-10-CM | POA: Diagnosis not present

## 2017-05-17 DIAGNOSIS — Z5181 Encounter for therapeutic drug level monitoring: Secondary | ICD-10-CM

## 2017-05-17 DIAGNOSIS — Z952 Presence of prosthetic heart valve: Secondary | ICD-10-CM

## 2017-05-17 LAB — POCT INR: INR: 2.5

## 2017-05-28 ENCOUNTER — Other Ambulatory Visit: Payer: Self-pay | Admitting: Cardiology

## 2017-05-30 NOTE — Telephone Encounter (Signed)
REFILL 

## 2017-05-31 DIAGNOSIS — H35352 Cystoid macular degeneration, left eye: Secondary | ICD-10-CM | POA: Diagnosis not present

## 2017-05-31 DIAGNOSIS — H353211 Exudative age-related macular degeneration, right eye, with active choroidal neovascularization: Secondary | ICD-10-CM | POA: Diagnosis not present

## 2017-05-31 DIAGNOSIS — H35721 Serous detachment of retinal pigment epithelium, right eye: Secondary | ICD-10-CM | POA: Diagnosis not present

## 2017-05-31 DIAGNOSIS — H353222 Exudative age-related macular degeneration, left eye, with inactive choroidal neovascularization: Secondary | ICD-10-CM | POA: Diagnosis not present

## 2017-05-31 DIAGNOSIS — H353112 Nonexudative age-related macular degeneration, right eye, intermediate dry stage: Secondary | ICD-10-CM | POA: Diagnosis not present

## 2017-06-09 ENCOUNTER — Encounter (HOSPITAL_COMMUNITY): Payer: Medicare Other

## 2017-06-14 ENCOUNTER — Encounter (INDEPENDENT_AMBULATORY_CARE_PROVIDER_SITE_OTHER): Payer: Self-pay

## 2017-06-14 ENCOUNTER — Ambulatory Visit (INDEPENDENT_AMBULATORY_CARE_PROVIDER_SITE_OTHER): Payer: Medicare Other | Admitting: *Deleted

## 2017-06-14 DIAGNOSIS — Z952 Presence of prosthetic heart valve: Secondary | ICD-10-CM

## 2017-06-14 DIAGNOSIS — Z5181 Encounter for therapeutic drug level monitoring: Secondary | ICD-10-CM | POA: Diagnosis not present

## 2017-06-14 DIAGNOSIS — I6529 Occlusion and stenosis of unspecified carotid artery: Secondary | ICD-10-CM

## 2017-06-14 LAB — POCT INR: INR: 2.4

## 2017-06-16 ENCOUNTER — Ambulatory Visit (HOSPITAL_COMMUNITY)
Admission: RE | Admit: 2017-06-16 | Discharge: 2017-06-16 | Disposition: A | Discharge status: Home / Self Care | Payer: Medicare Other | Source: Ambulatory Visit | Attending: Cardiology | Admitting: Cardiology

## 2017-06-16 DIAGNOSIS — I6529 Occlusion and stenosis of unspecified carotid artery: Secondary | ICD-10-CM | POA: Insufficient documentation

## 2017-06-16 DIAGNOSIS — I6523 Occlusion and stenosis of bilateral carotid arteries: Secondary | ICD-10-CM | POA: Diagnosis not present

## 2017-06-20 ENCOUNTER — Encounter: Payer: Self-pay | Admitting: *Deleted

## 2017-06-28 NOTE — Progress Notes (Signed)
HPI: FU coronary artery disease (s/p CABG), prior aortic valve replacement and aortic insufficiency. Last cardiac catheterization in 2011 revealed normal left main; 30% first diagonal, 50% LAD and 60% second diagonal. There was a 40-50% circumflex and a 40% right coronary artery. All grafts were occluded. Medical therapy recommended. Transesophageal echocardiogram in February of 2012 showed an ejection fraction of 45%. There was a St. Jude aortic valve with moderate perivalvular aortic insufficiency. There was moderate mitral regurgitation, moderate left atrial enlargement, mild right atrial enlargement and mild tricuspid regurgitation. Last echo 2/18 showed EF 30-35; grade 1 DD, moderate AS (mean gradient 23 mmHg) and moderate AI, mild MS, mild MR; moderate LAE; moderate TR with severely elevated pulmonary pressure. Carotid Dopplers August 2018 showed 40-59% right and 1-39% left stenosis. Since she was last seen, patient has dyspnea which is unchanged. No orthopnea, PND, pedal edema, chest pain or syncope.  Current Outpatient Prescriptions  Medication Sig Dispense Refill  . aspirin 81 MG EC tablet Take 81 mg by mouth daily.      Marland Kitchen. atorvastatin (LIPITOR) 40 MG tablet TAKE ONE TABLET BY MOUTH ONCE DAILY. 30 tablet 10  . benazepril (LOTENSIN) 40 MG tablet Take 1 tablet (40 mg total) by mouth daily. 90 tablet 3  . carvedilol (COREG) 3.125 MG tablet Take 1 tablet (3.125 mg total) by mouth 2 (two) times daily. 180 tablet 3  . donepezil (ARICEPT) 10 MG tablet TAKE ONE TABLET BY MOUTH ONCE DAILY. 30 tablet 0  . ergocalciferol (VITAMIN D2) 50000 UNITS capsule Take 50,000 Units by mouth once a week.     . ferrous sulfate 325 (65 FE) MG EC tablet Take 325 mg by mouth as needed.     . furosemide (LASIX) 20 MG tablet TAKE ONE TABLET BY MOUTH ONCE DAILY. 30 tablet 11  . LYRICA 75 MG capsule TAKE ONE CAPSULE BY MOUTH ONCE DAILY. 30 capsule 5  . meclizine (ANTIVERT) 25 MG tablet Take 25 mg by mouth 3 (three)  times daily as needed for dizziness.    . Melatonin 3 MG TABS Take 3 mg by mouth at bedtime.      . Multiple Vitamins-Minerals (VITEYES AREDS FORMULA PO) Take 1 tablet by mouth daily.    . potassium chloride (K-DUR) 10 MEQ tablet TAKE ONE TABLET BY MOUTH ONCE DAILY. 30 tablet 6  . potassium chloride (K-DUR,KLOR-CON) 10 MEQ tablet TAKE ONE TABLET BY MOUTH ONCE DAILY. 30 tablet 3  . traMADol (ULTRAM) 50 MG tablet as needed.    . traZODone (DESYREL) 50 MG tablet Take 1 tablet by mouth at bedtime as needed.    . warfarin (COUMADIN) 5 MG tablet TAKE 1/2 TO 1 TABLET DAILY AS DIRECTED BY COUMADIN CLINIC. 30 tablet 5   No current facility-administered medications for this visit.      Past Medical History:  Diagnosis Date  . Carotid artery stenosis    Without infarction  . Coronary artery disease   . Heart valve replaced by other means   . Hypercholesterolemia    Pure  . Hypertension    Unspecified  . LBBB (left bundle branch block)   . Macular degeneration (senile) of retina, unspecified   . Postsurgical aortocoronary bypass status   . Stroke (HCC)   . Transient global amnesia   . Unspecified hereditary and idiopathic peripheral neuropathy   . Unspecified vitamin D deficiency     Past Surgical History:  Procedure Laterality Date  . AORTIC VALVE REPLACEMENT    .  APPENDECTOMY    . CORONARY ARTERY BYPASS GRAFT    . TONSILLECTOMY      Social History   Social History  . Marital status: Widowed    Spouse name: N/A  . Number of children: 2  . Years of education: 12th   Occupational History  .  Retired   Social History Main Topics  . Smoking status: Former Smoker    Packs/day: 0.50    Types: Cigarettes    Quit date: 11/30/1991  . Smokeless tobacco: Never Used     Comment: Tobacco use-no  . Alcohol use No  . Drug use: No  . Sexual activity: No   Other Topics Concern  . Not on file   Social History Narrative   Pt lives at home alone.   Caffeine Use: very rarely     Family History  Problem Relation Age of Onset  . Heart attack Father   . Stroke Sister   . Neuropathy Brother   . COPD Sister     ROS: no fevers or chills, productive cough, hemoptysis, dysphasia, odynophagia, melena, hematochezia, dysuria, hematuria, rash, seizure activity, orthopnea, PND, pedal edema, claudication. Remaining systems are negative.  Physical Exam: Well-developed frail in no acute distress.  Skin is warm and dry.  HEENT is normal.  Neck is supple.  Chest is clear to auscultation with normal expansion.  Cardiovascular exam is regular rate and rhythm. Crisp mechanical valve Abdominal exam nontender or distended. No masses palpated. Extremities show no edema. neuro grossly intact   A/P  1 status post aortic valve replacement-will continue with SBE prophylaxis. She has moderate aortic insufficiency and moderate aortic stenosis by mean gradient. She is not having symptoms. Given age, would like to be conservative. We will likely repeat her echocardiogram when she returns in 6 months.  2 cardiomyopathy-given her age we would like to be conservative. We will continue with ACE inhibitor. Continue beta blocker. Increase carvedilol to 6.25 mg twice a day. Check potassium and renal function.  3 hypertension-blood pressure is controlled. Continue present medications.  4 hyperlipidemia-continue statin.  5 coronary artery disease-continue statin and aspirin.  6 carotid artery disease-follow-up carotid Dopplers August 2019.  [name], MD

## 2017-06-30 ENCOUNTER — Encounter: Payer: Self-pay | Admitting: Cardiology

## 2017-06-30 ENCOUNTER — Ambulatory Visit (INDEPENDENT_AMBULATORY_CARE_PROVIDER_SITE_OTHER): Payer: Medicare Other | Admitting: Cardiology

## 2017-06-30 VITALS — BP 126/50 | HR 58 | Ht 61.0 in | Wt 103.0 lb

## 2017-06-30 DIAGNOSIS — E7800 Pure hypercholesterolemia, unspecified: Secondary | ICD-10-CM

## 2017-06-30 DIAGNOSIS — I10 Essential (primary) hypertension: Secondary | ICD-10-CM | POA: Diagnosis not present

## 2017-06-30 DIAGNOSIS — I6529 Occlusion and stenosis of unspecified carotid artery: Secondary | ICD-10-CM | POA: Diagnosis not present

## 2017-06-30 DIAGNOSIS — I420 Dilated cardiomyopathy: Secondary | ICD-10-CM | POA: Diagnosis not present

## 2017-06-30 DIAGNOSIS — Z952 Presence of prosthetic heart valve: Secondary | ICD-10-CM

## 2017-06-30 MED ORDER — CARVEDILOL 6.25 MG PO TABS: 6.2500 mg | ORAL_TABLET | Freq: Two times a day (BID) | ORAL | 3 refills | Status: DC

## 2017-06-30 NOTE — Patient Instructions (Signed)
Medication Instructions:   INCREASE CARVEDILOL TO 6.25 MG TWICE DAILY= 2 OF THE 3.125 MG TABLETS TWICE DAILY  Follow-Up:  Your physician wants you to follow-up in: 6 MONTHS WITH DR Jens SomRENSHAW You will receive a reminder letter in the mail two months in advance. If you don't receive a letter, please call our office to schedule the follow-up appointment.   If you need a refill on your cardiac medications before your next appointment, please call your pharmacy.

## 2017-07-12 DIAGNOSIS — H353221 Exudative age-related macular degeneration, left eye, with active choroidal neovascularization: Secondary | ICD-10-CM | POA: Diagnosis not present

## 2017-07-12 DIAGNOSIS — H353112 Nonexudative age-related macular degeneration, right eye, intermediate dry stage: Secondary | ICD-10-CM | POA: Diagnosis not present

## 2017-07-12 DIAGNOSIS — H35352 Cystoid macular degeneration, left eye: Secondary | ICD-10-CM | POA: Diagnosis not present

## 2017-07-12 DIAGNOSIS — H353211 Exudative age-related macular degeneration, right eye, with active choroidal neovascularization: Secondary | ICD-10-CM | POA: Diagnosis not present

## 2017-07-12 DIAGNOSIS — H35721 Serous detachment of retinal pigment epithelium, right eye: Secondary | ICD-10-CM | POA: Diagnosis not present

## 2017-07-15 DIAGNOSIS — H353221 Exudative age-related macular degeneration, left eye, with active choroidal neovascularization: Secondary | ICD-10-CM | POA: Diagnosis not present

## 2017-07-26 ENCOUNTER — Ambulatory Visit (INDEPENDENT_AMBULATORY_CARE_PROVIDER_SITE_OTHER): Payer: Medicare Other | Admitting: *Deleted

## 2017-07-26 DIAGNOSIS — Z5181 Encounter for therapeutic drug level monitoring: Secondary | ICD-10-CM

## 2017-07-26 DIAGNOSIS — Z952 Presence of prosthetic heart valve: Secondary | ICD-10-CM

## 2017-07-26 LAB — POCT INR: INR: 4

## 2017-08-01 DIAGNOSIS — I5020 Unspecified systolic (congestive) heart failure: Secondary | ICD-10-CM | POA: Diagnosis not present

## 2017-08-01 DIAGNOSIS — I10 Essential (primary) hypertension: Secondary | ICD-10-CM | POA: Diagnosis not present

## 2017-08-01 DIAGNOSIS — J9611 Chronic respiratory failure with hypoxia: Secondary | ICD-10-CM | POA: Diagnosis not present

## 2017-08-01 DIAGNOSIS — R42 Dizziness and giddiness: Secondary | ICD-10-CM | POA: Diagnosis not present

## 2017-08-05 DIAGNOSIS — Z23 Encounter for immunization: Secondary | ICD-10-CM | POA: Diagnosis not present

## 2017-08-16 ENCOUNTER — Ambulatory Visit (INDEPENDENT_AMBULATORY_CARE_PROVIDER_SITE_OTHER): Payer: Medicare Other | Admitting: *Deleted

## 2017-08-16 DIAGNOSIS — Z5181 Encounter for therapeutic drug level monitoring: Secondary | ICD-10-CM

## 2017-08-16 DIAGNOSIS — Z952 Presence of prosthetic heart valve: Secondary | ICD-10-CM

## 2017-08-16 LAB — POCT INR: INR: 4.2

## 2017-08-30 DIAGNOSIS — H353112 Nonexudative age-related macular degeneration, right eye, intermediate dry stage: Secondary | ICD-10-CM | POA: Diagnosis not present

## 2017-08-30 DIAGNOSIS — H35721 Serous detachment of retinal pigment epithelium, right eye: Secondary | ICD-10-CM | POA: Diagnosis not present

## 2017-08-30 DIAGNOSIS — H353211 Exudative age-related macular degeneration, right eye, with active choroidal neovascularization: Secondary | ICD-10-CM | POA: Diagnosis not present

## 2017-08-30 DIAGNOSIS — H353221 Exudative age-related macular degeneration, left eye, with active choroidal neovascularization: Secondary | ICD-10-CM | POA: Diagnosis not present

## 2017-08-31 ENCOUNTER — Ambulatory Visit (INDEPENDENT_AMBULATORY_CARE_PROVIDER_SITE_OTHER): Payer: Medicare Other | Admitting: *Deleted

## 2017-08-31 DIAGNOSIS — Z952 Presence of prosthetic heart valve: Secondary | ICD-10-CM | POA: Diagnosis not present

## 2017-08-31 DIAGNOSIS — Z5181 Encounter for therapeutic drug level monitoring: Secondary | ICD-10-CM

## 2017-08-31 LAB — POCT INR: INR: 2

## 2017-09-06 DIAGNOSIS — H353221 Exudative age-related macular degeneration, left eye, with active choroidal neovascularization: Secondary | ICD-10-CM | POA: Diagnosis not present

## 2017-09-06 DIAGNOSIS — H35721 Serous detachment of retinal pigment epithelium, right eye: Secondary | ICD-10-CM | POA: Diagnosis not present

## 2017-09-06 DIAGNOSIS — H353211 Exudative age-related macular degeneration, right eye, with active choroidal neovascularization: Secondary | ICD-10-CM | POA: Diagnosis not present

## 2017-09-08 ENCOUNTER — Ambulatory Visit (INDEPENDENT_AMBULATORY_CARE_PROVIDER_SITE_OTHER): Payer: Medicare Other | Admitting: Otolaryngology

## 2017-09-08 DIAGNOSIS — H6121 Impacted cerumen, right ear: Secondary | ICD-10-CM | POA: Diagnosis not present

## 2017-09-08 DIAGNOSIS — H903 Sensorineural hearing loss, bilateral: Secondary | ICD-10-CM

## 2017-09-14 ENCOUNTER — Ambulatory Visit (INDEPENDENT_AMBULATORY_CARE_PROVIDER_SITE_OTHER): Payer: Medicare Other | Admitting: *Deleted

## 2017-09-14 DIAGNOSIS — Z5181 Encounter for therapeutic drug level monitoring: Secondary | ICD-10-CM

## 2017-09-14 DIAGNOSIS — Z952 Presence of prosthetic heart valve: Secondary | ICD-10-CM

## 2017-09-14 LAB — POCT INR: INR: 2.9

## 2017-09-19 ENCOUNTER — Other Ambulatory Visit: Payer: Self-pay | Admitting: Cardiology

## 2017-09-28 ENCOUNTER — Other Ambulatory Visit: Payer: Self-pay | Admitting: Cardiology

## 2017-10-03 DIAGNOSIS — I5020 Unspecified systolic (congestive) heart failure: Secondary | ICD-10-CM | POA: Diagnosis not present

## 2017-10-03 DIAGNOSIS — J9611 Chronic respiratory failure with hypoxia: Secondary | ICD-10-CM | POA: Diagnosis not present

## 2017-10-03 DIAGNOSIS — J449 Chronic obstructive pulmonary disease, unspecified: Secondary | ICD-10-CM | POA: Diagnosis not present

## 2017-10-03 DIAGNOSIS — I10 Essential (primary) hypertension: Secondary | ICD-10-CM | POA: Diagnosis not present

## 2017-10-04 ENCOUNTER — Encounter (INDEPENDENT_AMBULATORY_CARE_PROVIDER_SITE_OTHER): Payer: Self-pay

## 2017-10-04 ENCOUNTER — Ambulatory Visit (INDEPENDENT_AMBULATORY_CARE_PROVIDER_SITE_OTHER): Payer: Medicare Other | Admitting: Pharmacist

## 2017-10-04 DIAGNOSIS — Z952 Presence of prosthetic heart valve: Secondary | ICD-10-CM

## 2017-10-04 DIAGNOSIS — Z5181 Encounter for therapeutic drug level monitoring: Secondary | ICD-10-CM

## 2017-10-04 LAB — POCT INR: INR: 3.1

## 2017-10-04 NOTE — Patient Instructions (Signed)
Take a 1/2 tablet tonight and then continue taking 1/2 tablet everyday except 1 tablet on Tuesdays and Saturdays.  Recheck in 2 weeks.  Call with any medication changes [phone].

## 2017-10-15 ENCOUNTER — Other Ambulatory Visit: Payer: Self-pay | Admitting: Cardiology

## 2017-10-18 ENCOUNTER — Ambulatory Visit (INDEPENDENT_AMBULATORY_CARE_PROVIDER_SITE_OTHER): Payer: Medicare Other

## 2017-10-18 DIAGNOSIS — Z5181 Encounter for therapeutic drug level monitoring: Secondary | ICD-10-CM | POA: Diagnosis not present

## 2017-10-18 DIAGNOSIS — Z952 Presence of prosthetic heart valve: Secondary | ICD-10-CM | POA: Diagnosis not present

## 2017-10-18 LAB — POCT INR: INR: 3.2

## 2017-10-18 NOTE — Patient Instructions (Signed)
Start taking 1/2 tablet everyday except 1 tablet on Saturdays.  Recheck in 2 weeks.  Call with any medication changes [phone].

## 2017-10-19 ENCOUNTER — Other Ambulatory Visit: Payer: Self-pay | Admitting: Cardiology

## 2017-10-28 DIAGNOSIS — H353112 Nonexudative age-related macular degeneration, right eye, intermediate dry stage: Secondary | ICD-10-CM | POA: Diagnosis not present

## 2017-10-28 DIAGNOSIS — H353211 Exudative age-related macular degeneration, right eye, with active choroidal neovascularization: Secondary | ICD-10-CM | POA: Diagnosis not present

## 2017-10-28 DIAGNOSIS — H353221 Exudative age-related macular degeneration, left eye, with active choroidal neovascularization: Secondary | ICD-10-CM | POA: Diagnosis not present

## 2017-10-28 DIAGNOSIS — H35721 Serous detachment of retinal pigment epithelium, right eye: Secondary | ICD-10-CM | POA: Diagnosis not present

## 2017-10-31 ENCOUNTER — Ambulatory Visit (INDEPENDENT_AMBULATORY_CARE_PROVIDER_SITE_OTHER): Payer: Medicare Other

## 2017-10-31 DIAGNOSIS — Z5181 Encounter for therapeutic drug level monitoring: Secondary | ICD-10-CM

## 2017-10-31 DIAGNOSIS — Z952 Presence of prosthetic heart valve: Secondary | ICD-10-CM | POA: Diagnosis not present

## 2017-10-31 LAB — POCT INR: INR: 2.5

## 2017-10-31 NOTE — Patient Instructions (Signed)
Continue on same dosage 2.5mg  daily except 5mg  on Saturdays.  Recheck in 3 weeks.

## 2017-11-01 DIAGNOSIS — H353112 Nonexudative age-related macular degeneration, right eye, intermediate dry stage: Secondary | ICD-10-CM | POA: Diagnosis not present

## 2017-11-01 DIAGNOSIS — H353221 Exudative age-related macular degeneration, left eye, with active choroidal neovascularization: Secondary | ICD-10-CM | POA: Diagnosis not present

## 2017-11-01 DIAGNOSIS — H353122 Nonexudative age-related macular degeneration, left eye, intermediate dry stage: Secondary | ICD-10-CM | POA: Diagnosis not present

## 2017-11-01 DIAGNOSIS — H353211 Exudative age-related macular degeneration, right eye, with active choroidal neovascularization: Secondary | ICD-10-CM | POA: Diagnosis not present

## 2017-11-10 ENCOUNTER — Other Ambulatory Visit: Payer: Self-pay | Admitting: Cardiology

## 2017-11-22 ENCOUNTER — Ambulatory Visit (INDEPENDENT_AMBULATORY_CARE_PROVIDER_SITE_OTHER): Payer: Medicare Other | Admitting: *Deleted

## 2017-11-22 DIAGNOSIS — Z5181 Encounter for therapeutic drug level monitoring: Secondary | ICD-10-CM | POA: Diagnosis not present

## 2017-11-22 DIAGNOSIS — Z952 Presence of prosthetic heart valve: Secondary | ICD-10-CM | POA: Diagnosis not present

## 2017-11-22 LAB — POCT INR: INR: 1.1

## 2017-11-22 NOTE — Patient Instructions (Signed)
Description   Today Jan 8th take 1 tablet (5mg ) then tomorrow Jan 9th take 1 tablet (5mg ) then continue on same dosage 2.5mg  daily except 5mg  on Saturdays.  Recheck in 1 week.

## 2017-11-24 ENCOUNTER — Other Ambulatory Visit: Payer: Self-pay | Admitting: Cardiology

## 2017-11-29 ENCOUNTER — Ambulatory Visit (INDEPENDENT_AMBULATORY_CARE_PROVIDER_SITE_OTHER): Payer: Medicare Other | Admitting: Pharmacist

## 2017-11-29 DIAGNOSIS — Z5181 Encounter for therapeutic drug level monitoring: Secondary | ICD-10-CM

## 2017-11-29 DIAGNOSIS — Z952 Presence of prosthetic heart valve: Secondary | ICD-10-CM | POA: Diagnosis not present

## 2017-11-29 LAB — POCT INR: INR: 1.9

## 2017-11-29 NOTE — Patient Instructions (Signed)
Today Jan 15th take 1 tablet (5mg ) then tomorrow return to dosing schedule of 1/2 a pill (2.5 mg) every day except 1 pill (5 mg) on Saturday. Recheck in 1 week.

## 2017-12-06 ENCOUNTER — Ambulatory Visit (INDEPENDENT_AMBULATORY_CARE_PROVIDER_SITE_OTHER): Payer: Medicare Other | Admitting: *Deleted

## 2017-12-06 DIAGNOSIS — Z952 Presence of prosthetic heart valve: Secondary | ICD-10-CM

## 2017-12-06 DIAGNOSIS — Z5181 Encounter for therapeutic drug level monitoring: Secondary | ICD-10-CM | POA: Diagnosis not present

## 2017-12-06 LAB — POCT INR: INR: 2.6

## 2017-12-06 NOTE — Patient Instructions (Signed)
Description   Continue taking 1/2 a pill (2.5 mg) every day except 1 pill (5 mg) on Saturday. Recheck in 2 weeks.

## 2017-12-07 ENCOUNTER — Other Ambulatory Visit: Payer: Self-pay | Admitting: Orthopaedic Surgery

## 2017-12-19 ENCOUNTER — Ambulatory Visit (INDEPENDENT_AMBULATORY_CARE_PROVIDER_SITE_OTHER): Payer: Medicare Other | Admitting: *Deleted

## 2017-12-19 ENCOUNTER — Other Ambulatory Visit: Payer: Self-pay | Admitting: Cardiology

## 2017-12-19 DIAGNOSIS — Z952 Presence of prosthetic heart valve: Secondary | ICD-10-CM

## 2017-12-19 DIAGNOSIS — Z5181 Encounter for therapeutic drug level monitoring: Secondary | ICD-10-CM

## 2017-12-19 LAB — POCT INR: INR: 2.7

## 2017-12-19 NOTE — Telephone Encounter (Signed)
Rx request sent to pharmacy.  

## 2017-12-19 NOTE — Patient Instructions (Signed)
Description   Continue taking 1/2 a pill (2.5 mg) every day except 1 pill (5 mg) on Saturday. Recheck in 3 weeks.

## 2017-12-20 DIAGNOSIS — H35721 Serous detachment of retinal pigment epithelium, right eye: Secondary | ICD-10-CM | POA: Diagnosis not present

## 2017-12-20 DIAGNOSIS — H353211 Exudative age-related macular degeneration, right eye, with active choroidal neovascularization: Secondary | ICD-10-CM | POA: Diagnosis not present

## 2017-12-20 DIAGNOSIS — H353221 Exudative age-related macular degeneration, left eye, with active choroidal neovascularization: Secondary | ICD-10-CM | POA: Diagnosis not present

## 2017-12-20 DIAGNOSIS — H353112 Nonexudative age-related macular degeneration, right eye, intermediate dry stage: Secondary | ICD-10-CM | POA: Diagnosis not present

## 2017-12-24 ENCOUNTER — Other Ambulatory Visit: Payer: Self-pay | Admitting: Cardiology

## 2017-12-26 NOTE — Telephone Encounter (Signed)
REFILL 

## 2018-01-03 DIAGNOSIS — J9611 Chronic respiratory failure with hypoxia: Secondary | ICD-10-CM | POA: Diagnosis not present

## 2018-01-03 DIAGNOSIS — J449 Chronic obstructive pulmonary disease, unspecified: Secondary | ICD-10-CM | POA: Diagnosis not present

## 2018-01-03 DIAGNOSIS — R634 Abnormal weight loss: Secondary | ICD-10-CM | POA: Diagnosis not present

## 2018-01-03 DIAGNOSIS — I5020 Unspecified systolic (congestive) heart failure: Secondary | ICD-10-CM | POA: Diagnosis not present

## 2018-01-03 DIAGNOSIS — R739 Hyperglycemia, unspecified: Secondary | ICD-10-CM | POA: Diagnosis not present

## 2018-01-09 ENCOUNTER — Ambulatory Visit (INDEPENDENT_AMBULATORY_CARE_PROVIDER_SITE_OTHER): Payer: Medicare Other | Admitting: *Deleted

## 2018-01-09 DIAGNOSIS — Z5181 Encounter for therapeutic drug level monitoring: Secondary | ICD-10-CM | POA: Diagnosis not present

## 2018-01-09 DIAGNOSIS — Z952 Presence of prosthetic heart valve: Secondary | ICD-10-CM | POA: Diagnosis not present

## 2018-01-09 LAB — POCT INR: INR: 2.5

## 2018-01-09 NOTE — Patient Instructions (Signed)
Description   Continue taking 1/2 a pill (2.5 mg) every day except 1 pill (5 mg) on Saturday. Recheck in 4 weeks.

## 2018-01-10 DIAGNOSIS — H353211 Exudative age-related macular degeneration, right eye, with active choroidal neovascularization: Secondary | ICD-10-CM | POA: Diagnosis not present

## 2018-01-10 DIAGNOSIS — H11001 Unspecified pterygium of right eye: Secondary | ICD-10-CM | POA: Diagnosis not present

## 2018-01-10 DIAGNOSIS — H43811 Vitreous degeneration, right eye: Secondary | ICD-10-CM | POA: Diagnosis not present

## 2018-01-10 NOTE — Progress Notes (Signed)
HPI: FU coronary artery disease (s/p CABG), prior aortic valve replacement and aortic insufficiency. Last cardiac catheterization in 2011 revealed normal left main; 30% first diagonal, 50% LAD and 60% second diagonal. There was a 40-50% circumflex and a 40% right coronary artery. All grafts were occluded. Medical therapy recommended. Transesophageal echocardiogram in February of 2012 showed an ejection fraction of 45%. There was a St. Jude aortic valve with moderate perivalvular aortic insufficiency. There was moderate mitral regurgitation, moderate left atrial enlargement, mild right atrial enlargement and mild tricuspid regurgitation. Last echo 2/18 showed EF 30-35; grade 1 DD, moderate AS (mean gradient 23 mmHg) and moderate AI, mild MS, mild MR; moderate LAE; moderate TR with severely elevated pulmonary pressure. Carotid Dopplers August 2018 showed 40-59% right and 1-39% left stenosis. Since she was last seen, she does have dyspnea on exertion.  No orthopnea, PND, pedal edema, exertional chest pain or syncope.  Current Outpatient Medications  Medication Sig Dispense Refill  . ALPRAZolam (XANAX) 0.25 MG tablet Take 0.25 mg by mouth at bedtime as needed for anxiety.    Marland Kitchen aspirin 81 MG EC tablet Take 81 mg by mouth daily.      Marland Kitchen atorvastatin (LIPITOR) 40 MG tablet TAKE ONE TABLET BY MOUTH ONCE DAILY. 30 tablet 6  . benazepril (LOTENSIN) 40 MG tablet Take 1 tablet (40 mg total) by mouth daily. 90 tablet 3  . carvedilol (COREG) 6.25 MG tablet Take 1 tablet (6.25 mg total) by mouth 2 (two) times daily. 180 tablet 3  . donepezil (ARICEPT) 10 MG tablet TAKE ONE TABLET BY MOUTH ONCE DAILY. 30 tablet 0  . ergocalciferol (VITAMIN D2) 50000 UNITS capsule Take 50,000 Units by mouth once a week.     . ferrous sulfate 325 (65 FE) MG EC tablet Take 325 mg by mouth as needed.     . furosemide (LASIX) 20 MG tablet TAKE (1) TABLET BY MOUTH ONCE DAILY. 30 tablet 3  . LYRICA 75 MG capsule TAKE ONE CAPSULE BY  MOUTH ONCE DAILY. 30 capsule 5  . meclizine (ANTIVERT) 25 MG tablet Take 25 mg by mouth 3 (three) times daily as needed for dizziness.    . Melatonin 3 MG TABS Take 3 mg by mouth at bedtime.      . Multiple Vitamins-Minerals (VITEYES AREDS FORMULA PO) Take 1 tablet by mouth daily.    . potassium chloride (K-DUR) 10 MEQ tablet TAKE ONE TABLET BY MOUTH ONCE DAILY. 30 tablet 6  . potassium chloride (K-DUR,KLOR-CON) 10 MEQ tablet TAKE ONE TABLET BY MOUTH ONCE DAILY. 30 tablet 3  . traMADol (ULTRAM) 50 MG tablet as needed.    . traZODone (DESYREL) 50 MG tablet Take 1 tablet by mouth at bedtime as needed.    . warfarin (COUMADIN) 5 MG tablet TAKE 1/2 TO 1 TABLET DAILY AS DIRECTED BY COUMADIN CLINIC. 90 tablet 0   No current facility-administered medications for this visit.      Past Medical History:  Diagnosis Date  . Carotid artery stenosis    Without infarction  . Coronary artery disease   . Heart valve replaced by other means   . Hypercholesterolemia    Pure  . Hypertension    Unspecified  . LBBB (left bundle branch block)   . Macular degeneration (senile) of retina, unspecified   . Postsurgical aortocoronary bypass status   . Stroke (HCC)   . Transient global amnesia   . Unspecified hereditary and idiopathic peripheral neuropathy   . Unspecified  vitamin D deficiency     Past Surgical History:  Procedure Laterality Date  . AORTIC VALVE REPLACEMENT    . APPENDECTOMY    . CORONARY ARTERY BYPASS GRAFT    . TONSILLECTOMY      Social History   Socioeconomic History  . Marital status: Widowed    Spouse name: Not on file  . Number of children: 2  . Years of education: 12th  . Highest education level: Not on file  Social Needs  . Financial resource strain: Not on file  . Food insecurity - worry: Not on file  . Food insecurity - inability: Not on file  . Transportation needs - medical: Not on file  . Transportation needs - non-medical: Not on file  Occupational History     Employer: RETIRED  Tobacco Use  . Smoking status: Former Smoker    Packs/day: 0.50    Types: Cigarettes    Last attempt to quit: 11/30/1991    Years since quitting: 26.1  . Smokeless tobacco: Never Used  . Tobacco comment: Tobacco use-no  Substance and Sexual Activity  . Alcohol use: No  . Drug use: No  . Sexual activity: No  Other Topics Concern  . Not on file  Social History Narrative   Pt lives at home alone.   Caffeine Use: very rarely    Family History  Problem Relation Age of Onset  . Heart attack Father   . Stroke Sister   . Neuropathy Brother   . COPD Sister     ROS: Fatigue but no fevers or chills, productive cough, hemoptysis, dysphasia, odynophagia, melena, hematochezia, dysuria, hematuria, rash, seizure activity, orthopnea, PND, pedal edema, claudication. Remaining systems are negative.  Physical Exam: Well-developed frail in no acute distress.  Skin is warm and dry.  HEENT is normal.  Neck is supple.  Chest is clear to auscultation with normal expansion.  Cardiovascular exam is regular rate and rhythm.  Crisp mechanical valve sound.  2/6 diastolic murmur. Abdominal exam nontender or distended. No masses palpated. Extremities show no edema. neuro grossly intact  ECG-sinus bradycardia, left bundle branch block.  Personally reviewed  A/P  1 Coronary artery disease-continue aspirin and statin.  No chest pain.    2 status post aortic valve replacement-continue SBE prophylaxis.  We have elected conservative management given patient's age.  She does have moderate aortic insufficiency and aortic stenosis.  We will repeat echocardiogram. Doubt she would be a candidate for redo AVR.  3 cardiomyopathy-continue ACE inhibitor and beta-blocker.  As above we are treating conservatively given age.  4 hypertension-blood pressure is controlled.  Continue present medications.  5 hyperlipidemia-continue statin.  6 carotid artery disease-we will follow-up carotid  Dopplers August 2019.  Olga MillersBrian Dimples Probus, MD

## 2018-01-18 ENCOUNTER — Ambulatory Visit (INDEPENDENT_AMBULATORY_CARE_PROVIDER_SITE_OTHER): Payer: Medicare Other | Admitting: Cardiology

## 2018-01-18 ENCOUNTER — Encounter: Payer: Self-pay | Admitting: Cardiology

## 2018-01-18 VITALS — BP 118/58 | HR 52 | Ht 62.0 in | Wt 103.0 lb

## 2018-01-18 DIAGNOSIS — I420 Dilated cardiomyopathy: Secondary | ICD-10-CM

## 2018-01-18 DIAGNOSIS — I2510 Atherosclerotic heart disease of native coronary artery without angina pectoris: Secondary | ICD-10-CM | POA: Diagnosis not present

## 2018-01-18 DIAGNOSIS — I350 Nonrheumatic aortic (valve) stenosis: Secondary | ICD-10-CM | POA: Diagnosis not present

## 2018-01-18 NOTE — Patient Instructions (Signed)

## 2018-01-26 ENCOUNTER — Ambulatory Visit (HOSPITAL_COMMUNITY): Payer: Medicare Other | Attending: Cardiovascular Disease

## 2018-01-26 ENCOUNTER — Other Ambulatory Visit: Payer: Self-pay

## 2018-01-26 DIAGNOSIS — E785 Hyperlipidemia, unspecified: Secondary | ICD-10-CM | POA: Diagnosis not present

## 2018-01-26 DIAGNOSIS — Z952 Presence of prosthetic heart valve: Secondary | ICD-10-CM | POA: Diagnosis not present

## 2018-01-26 DIAGNOSIS — I350 Nonrheumatic aortic (valve) stenosis: Secondary | ICD-10-CM | POA: Insufficient documentation

## 2018-01-26 DIAGNOSIS — I10 Essential (primary) hypertension: Secondary | ICD-10-CM | POA: Diagnosis not present

## 2018-01-26 DIAGNOSIS — I2510 Atherosclerotic heart disease of native coronary artery without angina pectoris: Secondary | ICD-10-CM | POA: Diagnosis not present

## 2018-01-30 ENCOUNTER — Telehealth: Payer: Self-pay | Admitting: Cardiology

## 2018-01-30 ENCOUNTER — Other Ambulatory Visit: Payer: Self-pay | Admitting: Cardiology

## 2018-01-30 NOTE — Telephone Encounter (Signed)
New Message:   Pt is calling to get results of Echo

## 2018-01-30 NOTE — Telephone Encounter (Signed)
Notes recorded by Lewayne Buntingrenshaw, Brian S, MD on 01/27/2018 at 2:51 PM EDT LV function mildly improved compared to previous; AVR unchanged Colleen MillersBrian Crenshaw    Patient and daughter aware, verbalized understanding.

## 2018-01-31 DIAGNOSIS — H353211 Exudative age-related macular degeneration, right eye, with active choroidal neovascularization: Secondary | ICD-10-CM | POA: Diagnosis not present

## 2018-01-31 DIAGNOSIS — H353112 Nonexudative age-related macular degeneration, right eye, intermediate dry stage: Secondary | ICD-10-CM | POA: Diagnosis not present

## 2018-01-31 DIAGNOSIS — H353221 Exudative age-related macular degeneration, left eye, with active choroidal neovascularization: Secondary | ICD-10-CM | POA: Diagnosis not present

## 2018-01-31 DIAGNOSIS — H35721 Serous detachment of retinal pigment epithelium, right eye: Secondary | ICD-10-CM | POA: Diagnosis not present

## 2018-02-07 ENCOUNTER — Ambulatory Visit (INDEPENDENT_AMBULATORY_CARE_PROVIDER_SITE_OTHER): Payer: Medicare Other | Admitting: *Deleted

## 2018-02-07 DIAGNOSIS — Z5181 Encounter for therapeutic drug level monitoring: Secondary | ICD-10-CM | POA: Diagnosis not present

## 2018-02-07 DIAGNOSIS — Z952 Presence of prosthetic heart valve: Secondary | ICD-10-CM

## 2018-02-07 LAB — POCT INR: INR: 2.7

## 2018-02-07 NOTE — Patient Instructions (Signed)
Description   Continue taking 1/2 a pill (2.5 mg) every day except 1 pill (5 mg) on Saturday. Recheck in 6 weeks. [facility] [phone] Main [phone]

## 2018-02-10 ENCOUNTER — Other Ambulatory Visit: Payer: Self-pay | Admitting: *Deleted

## 2018-02-10 MED ORDER — ATORVASTATIN CALCIUM 40 MG PO TABS: 40.0000 mg | ORAL_TABLET | Freq: Every day | ORAL | 3 refills | Status: DC

## 2018-02-14 ENCOUNTER — Other Ambulatory Visit: Payer: Self-pay | Admitting: Cardiology

## 2018-02-14 DIAGNOSIS — H35721 Serous detachment of retinal pigment epithelium, right eye: Secondary | ICD-10-CM | POA: Diagnosis not present

## 2018-02-14 DIAGNOSIS — H353221 Exudative age-related macular degeneration, left eye, with active choroidal neovascularization: Secondary | ICD-10-CM | POA: Diagnosis not present

## 2018-02-14 DIAGNOSIS — H353112 Nonexudative age-related macular degeneration, right eye, intermediate dry stage: Secondary | ICD-10-CM | POA: Diagnosis not present

## 2018-02-14 DIAGNOSIS — H353211 Exudative age-related macular degeneration, right eye, with active choroidal neovascularization: Secondary | ICD-10-CM | POA: Diagnosis not present

## 2018-02-14 MED ORDER — ATORVASTATIN CALCIUM 40 MG PO TABS: 40.0000 mg | ORAL_TABLET | Freq: Every day | ORAL | 3 refills | Status: DC

## 2018-02-14 NOTE — Telephone Encounter (Signed)
°*  STAT* If patient is at the pharmacy, call can be transferred to refill team.   1. Which medications need to be refilled? (please list name of each medication and dose if known) Atorvastatin-it was sent to the wrong pharmacy  2. Which pharmacy/location (including street and city if local pharmacy) is medication to be sent to?[facility] [phone]  3. Do they need a 30 day or 90 day supply? 30

## 2018-02-14 NOTE — Telephone Encounter (Signed)
Follow Up:     Please make [facility]

## 2018-02-14 NOTE — Telephone Encounter (Signed)
Rx(s) sent to pharmacy electronically.  

## 2018-02-28 DIAGNOSIS — Z961 Presence of intraocular lens: Secondary | ICD-10-CM | POA: Diagnosis not present

## 2018-02-28 DIAGNOSIS — H353230 Exudative age-related macular degeneration, bilateral, stage unspecified: Secondary | ICD-10-CM | POA: Diagnosis not present

## 2018-03-07 ENCOUNTER — Other Ambulatory Visit: Payer: Self-pay | Admitting: Cardiology

## 2018-03-07 DIAGNOSIS — J449 Chronic obstructive pulmonary disease, unspecified: Secondary | ICD-10-CM | POA: Diagnosis not present

## 2018-03-07 DIAGNOSIS — J9611 Chronic respiratory failure with hypoxia: Secondary | ICD-10-CM | POA: Diagnosis not present

## 2018-03-07 DIAGNOSIS — Z952 Presence of prosthetic heart valve: Secondary | ICD-10-CM | POA: Diagnosis not present

## 2018-03-07 DIAGNOSIS — I5020 Unspecified systolic (congestive) heart failure: Secondary | ICD-10-CM | POA: Diagnosis not present

## 2018-03-07 NOTE — Telephone Encounter (Signed)
REFILL 

## 2018-03-10 ENCOUNTER — Other Ambulatory Visit: Payer: Self-pay | Admitting: Cardiology

## 2018-03-21 ENCOUNTER — Ambulatory Visit (INDEPENDENT_AMBULATORY_CARE_PROVIDER_SITE_OTHER): Payer: Medicare Other

## 2018-03-21 ENCOUNTER — Telehealth: Payer: Self-pay | Admitting: Cardiology

## 2018-03-21 DIAGNOSIS — Z952 Presence of prosthetic heart valve: Secondary | ICD-10-CM

## 2018-03-21 DIAGNOSIS — Z5181 Encounter for therapeutic drug level monitoring: Secondary | ICD-10-CM | POA: Diagnosis not present

## 2018-03-21 DIAGNOSIS — I6529 Occlusion and stenosis of unspecified carotid artery: Secondary | ICD-10-CM

## 2018-03-21 LAB — POCT INR: INR: 2.6

## 2018-03-21 NOTE — Telephone Encounter (Signed)
New message  Pt daughter [name], verbalzied that she is calling for RN  Pt needs an order doppler after aug 2nd

## 2018-03-21 NOTE — Telephone Encounter (Signed)
Pt's daughter calling requesting to have orders placed for pt's August Carotid Doppler. Routed to MD and primary nurse.

## 2018-03-21 NOTE — Telephone Encounter (Signed)
Spoke with pt dtr, carotid doppler scheduled for august.

## 2018-03-21 NOTE — Patient Instructions (Signed)
Description   Continue on same dosage 1/2 a pill (2.5 mg) every day except 1 pill (5 mg) on Saturdays. Recheck in 6 weeks. [facility] [phone] Main [phone]

## 2018-03-24 ENCOUNTER — Other Ambulatory Visit: Payer: Self-pay | Admitting: Cardiology

## 2018-03-24 NOTE — Telephone Encounter (Signed)
REFILL 

## 2018-04-04 DIAGNOSIS — H35721 Serous detachment of retinal pigment epithelium, right eye: Secondary | ICD-10-CM | POA: Diagnosis not present

## 2018-04-04 DIAGNOSIS — H353112 Nonexudative age-related macular degeneration, right eye, intermediate dry stage: Secondary | ICD-10-CM | POA: Diagnosis not present

## 2018-04-04 DIAGNOSIS — H353221 Exudative age-related macular degeneration, left eye, with active choroidal neovascularization: Secondary | ICD-10-CM | POA: Diagnosis not present

## 2018-04-04 DIAGNOSIS — H353211 Exudative age-related macular degeneration, right eye, with active choroidal neovascularization: Secondary | ICD-10-CM | POA: Diagnosis not present

## 2018-05-02 ENCOUNTER — Ambulatory Visit (INDEPENDENT_AMBULATORY_CARE_PROVIDER_SITE_OTHER): Payer: Medicare Other

## 2018-05-02 DIAGNOSIS — Z952 Presence of prosthetic heart valve: Secondary | ICD-10-CM

## 2018-05-02 DIAGNOSIS — Z5181 Encounter for therapeutic drug level monitoring: Secondary | ICD-10-CM

## 2018-05-02 LAB — POCT INR: INR: 2.3 (ref 2.0–3.0)

## 2018-05-02 NOTE — Patient Instructions (Signed)
Description   Continue on same dosage 1/2 a pill (2.5 mg) every day except 1 pill (5 mg) on Saturdays. Recheck in 6 weeks. [facility] [phone] Main [phone]

## 2018-05-05 ENCOUNTER — Other Ambulatory Visit: Payer: Self-pay | Admitting: Cardiology

## 2018-05-09 DIAGNOSIS — J9611 Chronic respiratory failure with hypoxia: Secondary | ICD-10-CM | POA: Diagnosis not present

## 2018-05-09 DIAGNOSIS — J449 Chronic obstructive pulmonary disease, unspecified: Secondary | ICD-10-CM | POA: Diagnosis not present

## 2018-05-09 DIAGNOSIS — I5020 Unspecified systolic (congestive) heart failure: Secondary | ICD-10-CM | POA: Diagnosis not present

## 2018-05-09 DIAGNOSIS — I10 Essential (primary) hypertension: Secondary | ICD-10-CM | POA: Diagnosis not present

## 2018-05-15 ENCOUNTER — Other Ambulatory Visit: Payer: Self-pay | Admitting: Cardiology

## 2018-05-15 NOTE — Telephone Encounter (Signed)
Please review for refill, thanks ! 

## 2018-05-30 DIAGNOSIS — H353221 Exudative age-related macular degeneration, left eye, with active choroidal neovascularization: Secondary | ICD-10-CM | POA: Diagnosis not present

## 2018-05-30 DIAGNOSIS — H353122 Nonexudative age-related macular degeneration, left eye, intermediate dry stage: Secondary | ICD-10-CM | POA: Diagnosis not present

## 2018-05-30 DIAGNOSIS — H43811 Vitreous degeneration, right eye: Secondary | ICD-10-CM | POA: Diagnosis not present

## 2018-05-30 DIAGNOSIS — H35352 Cystoid macular degeneration, left eye: Secondary | ICD-10-CM | POA: Diagnosis not present

## 2018-05-30 DIAGNOSIS — H353211 Exudative age-related macular degeneration, right eye, with active choroidal neovascularization: Secondary | ICD-10-CM | POA: Diagnosis not present

## 2018-06-13 ENCOUNTER — Ambulatory Visit (INDEPENDENT_AMBULATORY_CARE_PROVIDER_SITE_OTHER): Payer: Medicare Other

## 2018-06-13 ENCOUNTER — Encounter (INDEPENDENT_AMBULATORY_CARE_PROVIDER_SITE_OTHER): Payer: Self-pay

## 2018-06-13 ENCOUNTER — Other Ambulatory Visit: Payer: Self-pay | Admitting: Orthopaedic Surgery

## 2018-06-13 DIAGNOSIS — Z5181 Encounter for therapeutic drug level monitoring: Secondary | ICD-10-CM | POA: Diagnosis not present

## 2018-06-13 DIAGNOSIS — Z952 Presence of prosthetic heart valve: Secondary | ICD-10-CM | POA: Diagnosis not present

## 2018-06-13 LAB — POCT INR: INR: 1.8 — AB (ref 2.0–3.0)

## 2018-06-13 NOTE — Patient Instructions (Signed)
Description   Take 1 tablet today, then resume same dosage 1/2 a pill (2.5 mg) every day except 1 pill (5 mg) on Saturdays. Recheck in 4 weeks. [facility] [phone] Main [phone]

## 2018-06-20 ENCOUNTER — Ambulatory Visit (HOSPITAL_COMMUNITY)
Admission: RE | Admit: 2018-06-20 | Discharge: 2018-06-20 | Disposition: A | Discharge status: Home / Self Care | Payer: Medicare Other | Source: Ambulatory Visit | Attending: Cardiology | Admitting: Cardiology

## 2018-06-20 DIAGNOSIS — I6529 Occlusion and stenosis of unspecified carotid artery: Secondary | ICD-10-CM | POA: Insufficient documentation

## 2018-06-21 ENCOUNTER — Other Ambulatory Visit: Payer: Self-pay | Admitting: *Deleted

## 2018-06-21 DIAGNOSIS — I6529 Occlusion and stenosis of unspecified carotid artery: Secondary | ICD-10-CM

## 2018-06-22 DIAGNOSIS — I5020 Unspecified systolic (congestive) heart failure: Secondary | ICD-10-CM | POA: Diagnosis not present

## 2018-06-22 DIAGNOSIS — I10 Essential (primary) hypertension: Secondary | ICD-10-CM | POA: Diagnosis not present

## 2018-06-22 DIAGNOSIS — J449 Chronic obstructive pulmonary disease, unspecified: Secondary | ICD-10-CM | POA: Diagnosis not present

## 2018-06-22 DIAGNOSIS — J9611 Chronic respiratory failure with hypoxia: Secondary | ICD-10-CM | POA: Diagnosis not present

## 2018-06-26 ENCOUNTER — Inpatient Hospital Stay (HOSPITAL_COMMUNITY)
Admission: EM | Admit: 2018-06-26 | Discharge: 2018-07-01 | DRG: 292 | Disposition: A | Discharge status: Skilled Nursing Facility | Payer: Medicare Other | Attending: Pulmonary Disease | Admitting: Pulmonary Disease

## 2018-06-26 ENCOUNTER — Other Ambulatory Visit: Payer: Self-pay

## 2018-06-26 ENCOUNTER — Emergency Department (HOSPITAL_COMMUNITY): Payer: Medicare Other

## 2018-06-26 ENCOUNTER — Encounter (HOSPITAL_COMMUNITY): Payer: Self-pay | Admitting: *Deleted

## 2018-06-26 DIAGNOSIS — J811 Chronic pulmonary edema: Secondary | ICD-10-CM | POA: Diagnosis not present

## 2018-06-26 DIAGNOSIS — F0390 Unspecified dementia without behavioral disturbance: Secondary | ICD-10-CM | POA: Diagnosis present

## 2018-06-26 DIAGNOSIS — Z952 Presence of prosthetic heart valve: Secondary | ICD-10-CM

## 2018-06-26 DIAGNOSIS — Z882 Allergy status to sulfonamides status: Secondary | ICD-10-CM

## 2018-06-26 DIAGNOSIS — I5021 Acute systolic (congestive) heart failure: Secondary | ICD-10-CM | POA: Diagnosis not present

## 2018-06-26 DIAGNOSIS — Z87891 Personal history of nicotine dependence: Secondary | ICD-10-CM

## 2018-06-26 DIAGNOSIS — Z953 Presence of xenogenic heart valve: Secondary | ICD-10-CM

## 2018-06-26 DIAGNOSIS — Z9981 Dependence on supplemental oxygen: Secondary | ICD-10-CM

## 2018-06-26 DIAGNOSIS — Z7982 Long term (current) use of aspirin: Secondary | ICD-10-CM

## 2018-06-26 DIAGNOSIS — M25451 Effusion, right hip: Secondary | ICD-10-CM | POA: Diagnosis not present

## 2018-06-26 DIAGNOSIS — Z8669 Personal history of other diseases of the nervous system and sense organs: Secondary | ICD-10-CM

## 2018-06-26 DIAGNOSIS — M25551 Pain in right hip: Secondary | ICD-10-CM | POA: Diagnosis present

## 2018-06-26 DIAGNOSIS — I110 Hypertensive heart disease with heart failure: Principal | ICD-10-CM | POA: Diagnosis present

## 2018-06-26 DIAGNOSIS — K4090 Unilateral inguinal hernia, without obstruction or gangrene, not specified as recurrent: Secondary | ICD-10-CM | POA: Diagnosis present

## 2018-06-26 DIAGNOSIS — I447 Left bundle-branch block, unspecified: Secondary | ICD-10-CM | POA: Diagnosis present

## 2018-06-26 DIAGNOSIS — I959 Hypotension, unspecified: Secondary | ICD-10-CM | POA: Diagnosis not present

## 2018-06-26 DIAGNOSIS — E441 Mild protein-calorie malnutrition: Secondary | ICD-10-CM | POA: Diagnosis present

## 2018-06-26 DIAGNOSIS — I6523 Occlusion and stenosis of bilateral carotid arteries: Secondary | ICD-10-CM | POA: Diagnosis present

## 2018-06-26 DIAGNOSIS — J439 Emphysema, unspecified: Secondary | ICD-10-CM | POA: Diagnosis not present

## 2018-06-26 DIAGNOSIS — R402250 Coma scale, best verbal response, oriented, unspecified time: Secondary | ICD-10-CM | POA: Diagnosis present

## 2018-06-26 DIAGNOSIS — J9611 Chronic respiratory failure with hypoxia: Secondary | ICD-10-CM | POA: Diagnosis not present

## 2018-06-26 DIAGNOSIS — R402360 Coma scale, best motor response, obeys commands, unspecified time: Secondary | ICD-10-CM | POA: Diagnosis present

## 2018-06-26 DIAGNOSIS — G609 Hereditary and idiopathic neuropathy, unspecified: Secondary | ICD-10-CM | POA: Diagnosis present

## 2018-06-26 DIAGNOSIS — E559 Vitamin D deficiency, unspecified: Secondary | ICD-10-CM | POA: Diagnosis present

## 2018-06-26 DIAGNOSIS — H9190 Unspecified hearing loss, unspecified ear: Secondary | ICD-10-CM | POA: Diagnosis present

## 2018-06-26 DIAGNOSIS — I25810 Atherosclerosis of coronary artery bypass graft(s) without angina pectoris: Secondary | ICD-10-CM | POA: Diagnosis present

## 2018-06-26 DIAGNOSIS — Z79899 Other long term (current) drug therapy: Secondary | ICD-10-CM

## 2018-06-26 DIAGNOSIS — I509 Heart failure, unspecified: Secondary | ICD-10-CM

## 2018-06-26 DIAGNOSIS — D649 Anemia, unspecified: Secondary | ICD-10-CM | POA: Diagnosis present

## 2018-06-26 DIAGNOSIS — I2510 Atherosclerotic heart disease of native coronary artery without angina pectoris: Secondary | ICD-10-CM | POA: Diagnosis present

## 2018-06-26 DIAGNOSIS — R52 Pain, unspecified: Secondary | ICD-10-CM | POA: Diagnosis not present

## 2018-06-26 DIAGNOSIS — L89609 Pressure ulcer of unspecified heel, unspecified stage: Secondary | ICD-10-CM | POA: Diagnosis present

## 2018-06-26 DIAGNOSIS — Z681 Body mass index (BMI) 19 or less, adult: Secondary | ICD-10-CM

## 2018-06-26 DIAGNOSIS — J441 Chronic obstructive pulmonary disease with (acute) exacerbation: Secondary | ICD-10-CM | POA: Diagnosis present

## 2018-06-26 DIAGNOSIS — Z7901 Long term (current) use of anticoagulants: Secondary | ICD-10-CM

## 2018-06-26 DIAGNOSIS — Z951 Presence of aortocoronary bypass graft: Secondary | ICD-10-CM

## 2018-06-26 DIAGNOSIS — I5031 Acute diastolic (congestive) heart failure: Secondary | ICD-10-CM | POA: Diagnosis present

## 2018-06-26 DIAGNOSIS — I10 Essential (primary) hypertension: Secondary | ICD-10-CM | POA: Diagnosis present

## 2018-06-26 DIAGNOSIS — I5033 Acute on chronic diastolic (congestive) heart failure: Secondary | ICD-10-CM | POA: Diagnosis present

## 2018-06-26 DIAGNOSIS — W19XXXA Unspecified fall, initial encounter: Secondary | ICD-10-CM | POA: Diagnosis present

## 2018-06-26 DIAGNOSIS — Y92230 Patient room in hospital as the place of occurrence of the external cause: Secondary | ICD-10-CM | POA: Diagnosis not present

## 2018-06-26 DIAGNOSIS — Z8673 Personal history of transient ischemic attack (TIA), and cerebral infarction without residual deficits: Secondary | ICD-10-CM

## 2018-06-26 DIAGNOSIS — T465X5A Adverse effect of other antihypertensive drugs, initial encounter: Secondary | ICD-10-CM | POA: Diagnosis not present

## 2018-06-26 DIAGNOSIS — L89620 Pressure ulcer of left heel, unstageable: Secondary | ICD-10-CM | POA: Diagnosis present

## 2018-06-26 DIAGNOSIS — R402140 Coma scale, eyes open, spontaneous, unspecified time: Secondary | ICD-10-CM | POA: Diagnosis present

## 2018-06-26 DIAGNOSIS — E785 Hyperlipidemia, unspecified: Secondary | ICD-10-CM | POA: Diagnosis present

## 2018-06-26 DIAGNOSIS — Y92009 Unspecified place in unspecified non-institutional (private) residence as the place of occurrence of the external cause: Secondary | ICD-10-CM

## 2018-06-26 DIAGNOSIS — Z880 Allergy status to penicillin: Secondary | ICD-10-CM

## 2018-06-26 DIAGNOSIS — S79911A Unspecified injury of right hip, initial encounter: Secondary | ICD-10-CM | POA: Diagnosis not present

## 2018-06-26 LAB — CBC WITH DIFFERENTIAL/PLATELET
Basophils Absolute: 0 10*3/uL (ref 0.0–0.1)
Basophils Relative: 0 %
Eosinophils Absolute: 0.2 10*3/uL (ref 0.0–0.7)
Eosinophils Relative: 2 %
HCT: 29.3 % — ABNORMAL LOW (ref 36.0–46.0)
HEMOGLOBIN: 9 g/dL — AB (ref 12.0–15.0)
LYMPHS ABS: 0.9 10*3/uL (ref 0.7–4.0)
LYMPHS PCT: 10 %
MCH: 30.1 pg (ref 26.0–34.0)
MCHC: 30.7 g/dL (ref 30.0–36.0)
MCV: 98 fL (ref 78.0–100.0)
Monocytes Absolute: 0.8 10*3/uL (ref 0.1–1.0)
Monocytes Relative: 9 %
NEUTROS PCT: 79 %
Neutro Abs: 7 10*3/uL (ref 1.7–7.7)
Platelets: 193 10*3/uL (ref 150–400)
RBC: 2.99 MIL/uL — AB (ref 3.87–5.11)
RDW: 14 % (ref 11.5–15.5)
WBC: 8.9 10*3/uL (ref 4.0–10.5)

## 2018-06-26 LAB — COMPREHENSIVE METABOLIC PANEL
ALT: 24 U/L (ref 0–44)
AST: 36 U/L (ref 15–41)
Albumin: 3.5 g/dL (ref 3.5–5.0)
Alkaline Phosphatase: 73 U/L (ref 38–126)
Anion gap: 6 (ref 5–15)
BUN: 23 mg/dL (ref 8–23)
CALCIUM: 9.1 mg/dL (ref 8.9–10.3)
CO2: 31 mmol/L (ref 22–32)
Chloride: 106 mmol/L (ref 98–111)
Creatinine, Ser: 0.94 mg/dL (ref 0.44–1.00)
GFR, EST NON AFRICAN AMERICAN: 52 mL/min — AB (ref >60)
Glucose, Bld: 112 mg/dL — ABNORMAL HIGH (ref 70–99)
Potassium: 4.3 mmol/L (ref 3.5–5.1)
SODIUM: 143 mmol/L (ref 135–145)
Total Bilirubin: 0.7 mg/dL (ref 0.3–1.2)
Total Protein: 6.5 g/dL (ref 6.5–8.1)

## 2018-06-26 LAB — I-STAT TROPONIN, ED: Troponin i, poc: 0.02 ng/mL (ref 0.00–0.08)

## 2018-06-26 LAB — PROTIME-INR
INR: 2.65
Prothrombin Time: 28 seconds — ABNORMAL HIGH (ref 11.4–15.2)

## 2018-06-26 LAB — BRAIN NATRIURETIC PEPTIDE: B Natriuretic Peptide: 1329 pg/mL — ABNORMAL HIGH (ref 0.0–100.0)

## 2018-06-26 MED ORDER — METHYLPREDNISOLONE SODIUM SUCC 125 MG IJ SOLR
125.0000 mg | Freq: Once | INTRAMUSCULAR | Status: AC
  Administered 2018-06-26: 125 mg via INTRAVENOUS
  Filled 2018-06-26: qty 2

## 2018-06-26 MED ORDER — ALBUTEROL SULFATE (2.5 MG/3ML) 0.083% IN NEBU
2.5000 mg | INHALATION_SOLUTION | Freq: Once | RESPIRATORY_TRACT | Status: AC
  Administered 2018-06-26: 2.5 mg via RESPIRATORY_TRACT
  Filled 2018-06-26: qty 3

## 2018-06-26 MED ORDER — IPRATROPIUM-ALBUTEROL 0.5-2.5 (3) MG/3ML IN SOLN
3.0000 mL | Freq: Once | RESPIRATORY_TRACT | Status: AC
  Administered 2018-06-26: 3 mL via RESPIRATORY_TRACT
  Filled 2018-06-26: qty 3

## 2018-06-26 MED ORDER — FUROSEMIDE 10 MG/ML IJ SOLN
40.0000 mg | Freq: Once | INTRAMUSCULAR | Status: AC
  Administered 2018-06-26: 40 mg via INTRAVENOUS
  Filled 2018-06-26: qty 4

## 2018-06-26 NOTE — Progress Notes (Signed)
Pt off BIPAP and back on 4lpm cann (per home regimen) for trial and to ct per Dr.Zammit. Pt vital hr 76 rr 26 spo2 96%

## 2018-06-26 NOTE — ED Provider Notes (Signed)
[facility]Y [facility]PART[facility] Provider Note   CSN: [id number] Arrival date & time: 06/26/18  1840     History   Chief Complaint Chief Complaint  Patient presents with  . Hip Pain    HPI [name] is a 84 y.o. female.  Patient complains of right hip pain after fall.  Patient has a history of emphysema is mildly short of breath and on oxygen at home  The history is provided by the patient. No language interpreter was used.  Hip Pain  This is a new problem. The current episode started 12 to 24 hours ago. The problem occurs constantly. The problem has not changed since onset.Associated symptoms include shortness of breath. Pertinent negatives include no chest pain, no abdominal pain and no headaches. Exacerbated by: Movement. She has tried nothing for the symptoms. The treatment provided no relief.    Past Medical History:  Diagnosis Date  . Carotid artery stenosis    Without infarction  . Coronary artery disease   . Heart valve replaced by other means   . Hypercholesterolemia    Pure  . Hypertension    Unspecified  . LBBB (left bundle branch block)   . Macular degeneration (senile) of retina, unspecified   . Postsurgical aortocoronary bypass status   . Stroke (HCC)   . Transient global amnesia   . Unspecified hereditary and idiopathic peripheral neuropathy   . Unspecified vitamin D deficiency     Patient Active Problem List   Diagnosis Date Noted  . Congestive dilated cardiomyopathy (HCC) 09/29/2015  . Encounter for therapeutic drug monitoring 12/12/2013  . Memory loss 04/16/2013  . Transient confusion 04/16/2013  . Mild malnutrition (HCC) 11/28/2012  . Dehydration 11/27/2012  . Dementia 11/27/2012  . Pneumonia 11/24/2012  . Aortic valve disorders 01/13/2011  . Carotid artery stenosis 10/13/2009  . S/P AVR (aortic valve replacement) 10/13/2009  . HYPERLIPIDEMIA-MIXED 03/06/2009  . Essential hypertension 03/06/2009  . CAD, ARTERY BYPASS GRAFT  03/06/2009  . LBBB 03/06/2009  . CVA 03/06/2009    Past Surgical History:  Procedure Laterality Date  . AORTIC VALVE REPLACEMENT    . APPENDECTOMY    . CORONARY ARTERY BYPASS GRAFT    . TONSILLECTOMY       OB History   None      Home Medications    Prior to Admission medications   Medication Sig Start Date End Date Taking? Authorizing Provider  acetaminophen (TYLENOL 8 HOUR) 650 MG CR tablet Take 650 mg by mouth 2 (two) times daily.   Yes [provider]  ALPRAZolam (XANAX) 0.25 MG tablet Take 0.25 mg by mouth at bedtime.    Yes [provider]  aspirin 81 MG EC tablet Take 81 mg by mouth every evening.    Yes [provider]  atorvastatin (LIPITOR) 40 MG tablet Take 1 tablet (40 mg total) by mouth daily. Patient taking differently: Take 40 mg by mouth every morning.  02/14/18  Yes Lewayne Buntingrenshaw, Brian S, MD  baclofen (LIORESAL) 10 MG tablet Take 10 mg by mouth 2 (two) times daily.   Yes [provider]  benazepril (LOTENSIN) 40 MG tablet TAKE (1) TABLET BY MOUTH ONCE DAILY. Patient taking differently: Take 40 mg by mouth every morning.  03/07/18  Yes Lewayne Buntingrenshaw, Brian S, MD  carvedilol (COREG) 6.25 MG tablet Take 6.25 mg by mouth 2 (two) times daily with a meal.   Yes [provider]  donepezil (ARICEPT) 10 MG tablet TAKE ONE TABLET BY MOUTH  ONCE DAILY. Patient taking differently: Take 10 mg by mouth at bedtime.  08/12/15  Yes Penumalli, Glenford BayleyVikram R, MD  ergocalciferol (VITAMIN D2) 50000 UNITS capsule Take 50,000 Units by mouth every Tuesday.    Yes [provider]  ferrous sulfate 325 (65 FE) MG EC tablet Take 325 mg by mouth as needed.    Yes [provider]  furosemide (LASIX) 20 MG tablet TAKE (1) TABLET BY MOUTH ONCE DAILY. Patient taking differently: Take 20 mg by mouth every morning.  05/05/18  Yes Crenshaw, Madolyn FriezeBrian S, MD  LYRICA 75 MG capsule TAKE ONE CAPSULE BY MOUTH ONCE DAILY. Patient taking differently: Take 75 mg by  mouth every other day. AT BEDTIME 06/13/18  Yes Darreld McleanKeeling, Wayne, MD  meclizine (ANTIVERT) 25 MG tablet Take 25 mg by mouth 3 (three) times daily as needed for dizziness.   Yes [provider]  Melatonin 3 MG TABS Take 3 mg by mouth at bedtime.     Yes [provider]  Multiple Vitamins-Minerals (VITEYES AREDS FORMULA PO) Take 1 tablet by mouth daily.   Yes [provider]  potassium chloride (K-DUR) 10 MEQ tablet TAKE ONE TABLET BY MOUTH ONCE DAILY. Patient taking differently: Take 10 mEq by mouth every morning.  06/03/16  Yes Crenshaw, Madolyn FriezeBrian S, MD  traMADol (ULTRAM) 50 MG tablet Take 50 mg by mouth every 12 (twelve) hours as needed for moderate pain or severe pain.  03/28/13  Yes [provider]  traZODone (DESYREL) 50 MG tablet Take 50 mg by mouth at bedtime as needed for sleep.  11/29/12  Yes [provider]  umeclidinium-vilanterol (ANORO ELLIPTA) 62.5-25 MCG/INH AEPB Inhale 1 puff into the lungs every morning.   Yes [provider]  warfarin (COUMADIN) 5 MG tablet TAKE 1/2 TO 1 TABLET DAILY AS DIRECTED BY COUMADIN CLINIC. Patient taking differently: Take 2.5-5 mg by mouth See admin instructions. 2.5MG  ON ALL DAYS EXCEPT 5MG  ON SATURDAYS ONLY. TAKES AT SUPPER 05/15/18  Yes Crenshaw, Madolyn FriezeBrian S, MD    Family History Family History  Problem Relation Age of Onset  . Heart attack Father   . Stroke Sister   . Neuropathy Brother   . COPD Sister     Social History Social History   Tobacco Use  . Smoking status: Former Smoker    Packs/day: 0.50    Types: Cigarettes    Last attempt to quit: 11/30/1991    Years since quitting: 26.5  . Smokeless tobacco: Never Used  . Tobacco comment: Tobacco use-no  Substance Use Topics  . Alcohol use: No  . Drug use: No     Allergies   Codeine and Penicillins   Review of Systems Review of Systems  Constitutional: Negative for appetite change and fatigue.  HENT: Negative for congestion, ear discharge  and sinus pressure.   Eyes: Negative for discharge.  Respiratory: Positive for shortness of breath. Negative for cough.   Cardiovascular: Negative for chest pain.  Gastrointestinal: Negative for abdominal pain and diarrhea.  Genitourinary: Negative for frequency and hematuria.  Musculoskeletal: Negative for back pain.       Hip pain  Skin: Negative for rash.  Neurological: Negative for seizures and headaches.  Psychiatric/Behavioral: Negative for hallucinations.     Physical Exam Updated Vital Signs BP (!) 170/86   Pulse 77   Temp 99.2 F (37.3 C) (Oral)   Resp (!) 26   Ht 5\' 6"  (1.676 m)   Wt 48.1 kg   SpO2 95%  BMI 17.11 kg/m   Physical Exam  Constitutional: She is oriented to person, place, and time. She appears well-developed.  HENT:  Head: Normocephalic.  Eyes: Conjunctivae and EOM are normal. No scleral icterus.  Neck: Neck supple. No thyromegaly present.  Cardiovascular: Normal rate and regular rhythm. Exam reveals no gallop and no friction rub.  No murmur heard. Pulmonary/Chest: No stridor. She has wheezes. She has no rales. She exhibits no tenderness.  Abdominal: She exhibits no distension. There is no tenderness. There is no rebound.  Musculoskeletal: Normal range of motion. She exhibits no edema.  Tender right hip  Lymphadenopathy:    She has no cervical adenopathy.  Neurological: She is oriented to person, place, and time. She exhibits normal muscle tone. Coordination normal.  Skin: No rash noted. No erythema.  Psychiatric: She has a normal mood and affect. Her behavior is normal.     ED Treatments / Results  Labs (all labs ordered are listed, but only abnormal results are displayed) Labs Reviewed  CBC WITH DIFFERENTIAL/PLATELET - Abnormal; Notable for the following components:      Result Value   RBC 2.99 (*)    Hemoglobin 9.0 (*)    HCT 29.3 (*)    All other components within normal limits  COMPREHENSIVE METABOLIC PANEL - Abnormal; Notable for  the following components:   Glucose, Bld 112 (*)    GFR calc non Af Amer 52 (*)    All other components within normal limits  BRAIN NATRIURETIC PEPTIDE - Abnormal; Notable for the following components:   B Natriuretic Peptide 1,329.0 (*)    All other components within normal limits  PROTIME-INR - Abnormal; Notable for the following components:   Prothrombin Time 28.0 (*)    All other components within normal limits  I-STAT TROPONIN, ED    EKG None  Radiology Dg Chest 2 View  Result Date: 06/26/2018 CLINICAL DATA:  Weakness, right hip pain EXAM: CHEST - 2 VIEW COMPARISON:  03/10/2017, CT chest 03/28/2018 FINDINGS: Sternotomy changes. Patient is rotated. Small bilateral pleural effusions. Hazy airspace disease at the bases. Cardiomegaly with vascular congestion and mild diffuse interstitial opacity consistent with edema. Aortic atherosclerosis. No pneumothorax. Old right-sided rib deformities. Emphysematous disease. IMPRESSION: 1. Cardiomegaly with vascular congestion, mild interstitial pulmonary edema, and small bilateral pleural effusions. 2. Hazy airspace disease at the bases may reflect atelectasis or edema. Electronically Signed   By: Jasmine Pang M.D.   On: 06/26/2018 20:11   Dg Hip Unilat With Pelvis 2-3 Views Right  Result Date: 06/26/2018 CLINICAL DATA:  Right hip and groin pain after a fall EXAM: DG HIP (WITH OR WITHOUT PELVIS) 2-3V RIGHT COMPARISON:  None. FINDINGS: Limited evaluation of the right femoral neck due to superimposition of trochanter. Possible step-off deformity at the right femoral head neck junction. Old appearing fracture deformity of the right superior and inferior pubic rami. IMPRESSION: 1. Limited evaluation of femoral neck due to positioning, questionable step-off deformity at the right femoral head neck junction, suggest CT to better evaluate. 2. Old appearing fractures of the right superior and inferior pubic rami. Electronically Signed   By: Jasmine Pang M.D.    On: 06/26/2018 20:13    Procedures Procedures (including critical care time)  Medications Ordered in ED Medications  ipratropium-albuterol (DUONEB) 0.5-2.5 (3) MG/3ML nebulizer solution 3 mL (3 mLs Nebulization Given 06/26/18 2141)  albuterol (PROVENTIL) (2.5 MG/3ML) 0.083% nebulizer solution 2.5 mg (2.5 mg Nebulization Given 06/26/18 2140)  methylPREDNISolone sodium succinate (SOLU-MEDROL) 125 mg/2 mL  injection 125 mg (125 mg Intravenous Given 06/26/18 2151)  furosemide (LASIX) injection 40 mg (40 mg Intravenous Given 06/26/18 2151)   CRITICAL CARE Performed by: Bethann Berkshire Total critical care time:22minutes Critical care time was exclusive of separately billable procedures and treating other patients. Critical care was necessary to treat or prevent imminent or life-threatening deterioration. Critical care was time spent personally by me on the following activities: development of treatment plan with patient and/or surrogate as well as nursing, discussions with consultants, evaluation of patient's response to treatment, examination of patient, obtaining history from patient or surrogate, ordering and performing treatments and interventions, ordering and review of laboratory studies, ordering and review of radiographic studies, pulse oximetry and re-evaluation of patient's condition.\  Initial Impression / Assessment and Plan / ED Course  I have reviewed the triage vital signs and the nursing notes.  Pertinent labs & imaging results that were available during my care of the patient were reviewed by me and considered in my medical decision making (see chart for details).     When patient returned from getting her plain films of her right hip she became short of breath.  She had wheezing and tachypnea.  Chest x-ray showed congestive heart failure.  BNP was elevated.  Patient improved with Lasix and BiPAP.  Patient is x-ray of her right hip did not show any fracture but they recommended CT of  hip.  That is pending  Final Clinical Impressions(s) / ED Diagnoses   Final diagnoses:  Acute systolic congestive heart failure (HCC)  Right hip pain    ED Discharge Orders    None       Bethann Berkshire, MD 06/26/18 2333

## 2018-06-26 NOTE — ED Triage Notes (Signed)
Patient complains of right hip and groin pain, reported fall this morning around 7 am.at home coming out of bathroom, states "leg just gave out".  Denies hitting head, but did fall on back.

## 2018-06-27 ENCOUNTER — Other Ambulatory Visit: Payer: Self-pay

## 2018-06-27 ENCOUNTER — Encounter (HOSPITAL_COMMUNITY): Payer: Self-pay | Admitting: Internal Medicine

## 2018-06-27 ENCOUNTER — Other Ambulatory Visit (HOSPITAL_COMMUNITY): Payer: Medicare Other

## 2018-06-27 DIAGNOSIS — I5031 Acute diastolic (congestive) heart failure: Secondary | ICD-10-CM | POA: Diagnosis not present

## 2018-06-27 DIAGNOSIS — I6523 Occlusion and stenosis of bilateral carotid arteries: Secondary | ICD-10-CM

## 2018-06-27 DIAGNOSIS — D649 Anemia, unspecified: Secondary | ICD-10-CM | POA: Diagnosis present

## 2018-06-27 DIAGNOSIS — I25708 Atherosclerosis of coronary artery bypass graft(s), unspecified, with other forms of angina pectoris: Secondary | ICD-10-CM

## 2018-06-27 DIAGNOSIS — J449 Chronic obstructive pulmonary disease, unspecified: Secondary | ICD-10-CM | POA: Diagnosis not present

## 2018-06-27 DIAGNOSIS — E785 Hyperlipidemia, unspecified: Secondary | ICD-10-CM | POA: Diagnosis not present

## 2018-06-27 DIAGNOSIS — I10 Essential (primary) hypertension: Secondary | ICD-10-CM | POA: Diagnosis not present

## 2018-06-27 DIAGNOSIS — Z952 Presence of prosthetic heart valve: Secondary | ICD-10-CM

## 2018-06-27 DIAGNOSIS — M25559 Pain in unspecified hip: Secondary | ICD-10-CM | POA: Diagnosis not present

## 2018-06-27 LAB — CBC
HCT: 28.6 % — ABNORMAL LOW (ref 36.0–46.0)
Hemoglobin: 9.2 g/dL — ABNORMAL LOW (ref 12.0–15.0)
MCH: 31.2 pg (ref 26.0–34.0)
MCHC: 32.2 g/dL (ref 30.0–36.0)
MCV: 96.9 fL (ref 78.0–100.0)
PLATELETS: 207 10*3/uL (ref 150–400)
RBC: 2.95 MIL/uL — ABNORMAL LOW (ref 3.87–5.11)
RDW: 14 % (ref 11.5–15.5)
WBC: 7.9 10*3/uL (ref 4.0–10.5)

## 2018-06-27 LAB — TROPONIN I
TROPONIN I: 0.05 ng/mL — AB (ref <0.03)
Troponin I: 0.06 ng/mL (ref <0.03)
Troponin I: 0.06 ng/mL (ref <0.03)

## 2018-06-27 LAB — MRSA PCR SCREENING: MRSA by PCR: NEGATIVE

## 2018-06-27 LAB — MAGNESIUM: Magnesium: 1.9 mg/dL (ref 1.7–2.4)

## 2018-06-27 LAB — PROTIME-INR
INR: 2.22
Prothrombin Time: 24.4 seconds — ABNORMAL HIGH (ref 11.4–15.2)

## 2018-06-27 MED ORDER — DONEPEZIL HCL 5 MG PO TABS
10.0000 mg | ORAL_TABLET | Freq: Every day | ORAL | Status: DC
  Administered 2018-06-27 – 2018-06-30 (×4): 10 mg via ORAL
  Filled 2018-06-27 (×4): qty 2

## 2018-06-27 MED ORDER — BACLOFEN 10 MG PO TABS
10.0000 mg | ORAL_TABLET | Freq: Two times a day (BID) | ORAL | Status: DC
  Administered 2018-06-27 – 2018-07-01 (×9): 10 mg via ORAL
  Filled 2018-06-27 (×9): qty 1

## 2018-06-27 MED ORDER — VITAMIN D (ERGOCALCIFEROL) 1.25 MG (50000 UNIT) PO CAPS
50000.0000 [IU] | ORAL_CAPSULE | ORAL | Status: DC
  Administered 2018-06-27: 50000 [IU] via ORAL
  Filled 2018-06-27 (×2): qty 1

## 2018-06-27 MED ORDER — ACETAMINOPHEN 650 MG RE SUPP: 650.0000 mg | Freq: Four times a day (QID) | RECTAL | Status: DC | PRN

## 2018-06-27 MED ORDER — WARFARIN SODIUM 2.5 MG PO TABS
2.5000 mg | ORAL_TABLET | Freq: Once | ORAL | Status: AC
  Administered 2018-06-27: 2.5 mg via ORAL
  Filled 2018-06-27: qty 1

## 2018-06-27 MED ORDER — ACETAMINOPHEN 325 MG PO TABS: 650.0000 mg | ORAL_TABLET | Freq: Four times a day (QID) | ORAL | Status: DC | PRN

## 2018-06-27 MED ORDER — ONDANSETRON HCL 4 MG PO TABS: 4.0000 mg | ORAL_TABLET | Freq: Four times a day (QID) | ORAL | Status: DC | PRN

## 2018-06-27 MED ORDER — ALPRAZOLAM 0.25 MG PO TABS
0.2500 mg | ORAL_TABLET | Freq: Every day | ORAL | Status: DC
  Administered 2018-06-27 – 2018-06-30 (×4): 0.25 mg via ORAL
  Filled 2018-06-27 (×4): qty 1

## 2018-06-27 MED ORDER — WARFARIN - PHARMACIST DOSING INPATIENT
Freq: Every day | Status: DC
  Administered 2018-06-28 – 2018-06-30 (×3)

## 2018-06-27 MED ORDER — BENAZEPRIL HCL 10 MG PO TABS
40.0000 mg | ORAL_TABLET | Freq: Every morning | ORAL | Status: DC
  Administered 2018-06-27 – 2018-07-01 (×5): 40 mg via ORAL
  Filled 2018-06-27 (×5): qty 4

## 2018-06-27 MED ORDER — ATORVASTATIN CALCIUM 40 MG PO TABS
40.0000 mg | ORAL_TABLET | Freq: Every morning | ORAL | Status: DC
  Administered 2018-06-27 – 2018-07-01 (×5): 40 mg via ORAL
  Filled 2018-06-27 (×5): qty 1

## 2018-06-27 MED ORDER — CARVEDILOL 3.125 MG PO TABS
6.2500 mg | ORAL_TABLET | Freq: Two times a day (BID) | ORAL | Status: DC
  Administered 2018-06-27 – 2018-07-01 (×9): 6.25 mg via ORAL
  Filled 2018-06-27 (×9): qty 2

## 2018-06-27 MED ORDER — ASPIRIN EC 81 MG PO TBEC
81.0000 mg | DELAYED_RELEASE_TABLET | Freq: Every evening | ORAL | Status: DC
  Administered 2018-06-27 – 2018-06-30 (×4): 81 mg via ORAL
  Filled 2018-06-27 (×4): qty 1

## 2018-06-27 MED ORDER — FUROSEMIDE 10 MG/ML IJ SOLN
20.0000 mg | Freq: Two times a day (BID) | INTRAMUSCULAR | Status: DC
  Administered 2018-06-27 (×2): 20 mg via INTRAVENOUS
  Filled 2018-06-27 (×2): qty 2

## 2018-06-27 MED ORDER — PREGABALIN 75 MG PO CAPS
75.0000 mg | ORAL_CAPSULE | ORAL | Status: DC
Start: 2018-06-27 — End: 2018-07-01
  Administered 2018-06-27 – 2018-07-01 (×3): 75 mg via ORAL
  Filled 2018-06-27 (×4): qty 1

## 2018-06-27 MED ORDER — TRAZODONE HCL 50 MG PO TABS
50.0000 mg | ORAL_TABLET | Freq: Every evening | ORAL | Status: DC | PRN
  Administered 2018-07-01: 50 mg via ORAL
  Filled 2018-06-27: qty 1

## 2018-06-27 MED ORDER — ONDANSETRON HCL 4 MG/2ML IJ SOLN: 4.0000 mg | Freq: Four times a day (QID) | INTRAMUSCULAR | Status: DC | PRN

## 2018-06-27 MED ORDER — POTASSIUM CHLORIDE CRYS ER 10 MEQ PO TBCR
10.0000 meq | EXTENDED_RELEASE_TABLET | Freq: Every morning | ORAL | Status: DC
  Administered 2018-06-27: 10 meq via ORAL
  Filled 2018-06-27 (×3): qty 1

## 2018-06-27 NOTE — ED Notes (Signed)
Date and time results received: 06/27/18 1:43 AM  (use smartphrase ".now" to insert current time)  Test: troponins  Critical Value: 0.06  Name of Provider Notified: Robb MatarOrtiz  Orders Received? Or Actions Taken?: n/a

## 2018-06-27 NOTE — ED Notes (Signed)
Pt family changed linens without assistance due to incontinence episode. purewick repositioned

## 2018-06-27 NOTE — Progress Notes (Signed)
ANTICOAGULATION CONSULT NOTE - Initial Consult  Pharmacy Consult for Coumadin Indication: mechanical valve  Allergies  Allergen Reactions  . Codeine   . Penicillins     Patient Measurements: Height: 5\' 6"  (167.6 cm) Weight: 101 lb 6.6 oz (46 kg) IBW/kg (Calculated) : 59.3  Vital Signs: Temp: 98.1 F (36.7 C) (08/13 1105) Temp Source: Oral (08/13 1105) BP: 146/46 (08/13 0730) Pulse Rate: 56 (08/13 1105)  Labs: Recent Labs    06/26/18 1935 06/27/18 0018 06/27/18 0614  HGB 9.0*  --  9.2*  HCT 29.3*  --  28.6*  PLT 193  --  207  LABPROT 28.0*  --  24.4*  INR 2.65  --  2.22  CREATININE 0.94  --   --   TROPONINI  --  0.06* 0.06*    Estimated Creatinine Clearance: 29.5 mL/min (by C-G formula based on SCr of 0.94 mg/dL).   Medical History: Past Medical History:  Diagnosis Date  . Carotid artery stenosis    Without infarction  . Coronary artery disease   . Heart valve replaced by other means   . Hypercholesterolemia    Pure  . Hypertension    Unspecified  . LBBB (left bundle branch block)   . Macular degeneration (senile) of retina, unspecified   . Postsurgical aortocoronary bypass status   . Stroke (HCC)   . Transient global amnesia   . Unspecified hereditary and idiopathic peripheral neuropathy   . Unspecified vitamin D deficiency     Medications:  Medications Prior to Admission  Medication Sig Dispense Refill Last Dose  . acetaminophen (TYLENOL 8 HOUR) 650 MG CR tablet Take 650 mg by mouth 2 (two) times daily.   06/26/2018 at Unknown time  . ALPRAZolam (XANAX) 0.25 MG tablet Take 0.25 mg by mouth at bedtime.    06/25/2018 at Unknown time  . aspirin 81 MG EC tablet Take 81 mg by mouth every evening.    06/25/2018 at Unknown time  . atorvastatin (LIPITOR) 40 MG tablet Take 1 tablet (40 mg total) by mouth daily. (Patient taking differently: Take 40 mg by mouth every morning. ) 90 tablet 3 06/26/2018 at Unknown time  . baclofen (LIORESAL) 10 MG tablet Take 10 mg  by mouth 2 (two) times daily.   06/26/2018 at Unknown time  . benazepril (LOTENSIN) 40 MG tablet TAKE (1) TABLET BY MOUTH ONCE DAILY. (Patient taking differently: Take 40 mg by mouth every morning. ) 90 tablet 1 06/26/2018 at Unknown time  . carvedilol (COREG) 6.25 MG tablet Take 6.25 mg by mouth 2 (two) times daily with a meal.   06/26/2018 at 9-930a  . donepezil (ARICEPT) 10 MG tablet TAKE ONE TABLET BY MOUTH ONCE DAILY. (Patient taking differently: Take 10 mg by mouth at bedtime. ) 30 tablet 0 06/25/2018 at Unknown time  . ergocalciferol (VITAMIN D2) 50000 UNITS capsule Take 50,000 Units by mouth every Tuesday.    Past Week at Unknown time  . ferrous sulfate 325 (65 FE) MG EC tablet Take 325 mg by mouth as needed.    Past Month at Unknown time  . furosemide (LASIX) 20 MG tablet TAKE (1) TABLET BY MOUTH ONCE DAILY. (Patient taking differently: Take 20 mg by mouth every morning. ) 30 tablet 5 06/26/2018 at Unknown time  . LYRICA 75 MG capsule TAKE ONE CAPSULE BY MOUTH ONCE DAILY. (Patient taking differently: Take 75 mg by mouth every other day. AT BEDTIME) 30 capsule 0 06/25/2018 at Unknown time  . meclizine (ANTIVERT) 25 MG  tablet Take 25 mg by mouth 3 (three) times daily as needed for dizziness.   UNKNOWN  . Melatonin 3 MG TABS Take 3 mg by mouth at bedtime.     06/25/2018 at Unknown time  . Multiple Vitamins-Minerals (VITEYES AREDS FORMULA PO) Take 1 tablet by mouth daily.   Past Month at Unknown time  . potassium chloride (K-DUR) 10 MEQ tablet TAKE ONE TABLET BY MOUTH ONCE DAILY. (Patient taking differently: Take 10 mEq by mouth every morning. ) 30 tablet 6 06/26/2018 at Unknown time  . traMADol (ULTRAM) 50 MG tablet Take 50 mg by mouth every 12 (twelve) hours as needed for moderate pain or severe pain.    UNKNOWN  . traZODone (DESYREL) 50 MG tablet Take 50 mg by mouth at bedtime as needed for sleep.    UNKNOWN  . umeclidinium-vilanterol (ANORO ELLIPTA) 62.5-25 MCG/INH AEPB Inhale 1 puff into the lungs  every morning.   06/26/2018 at Unknown time  . warfarin (COUMADIN) 5 MG tablet TAKE 1/2 TO 1 TABLET DAILY AS DIRECTED BY COUMADIN CLINIC. (Patient taking differently: Take 2.5-5 mg by mouth See admin instructions. 2.5MG  ON ALL DAYS EXCEPT 5MG  ON SATURDAYS ONLY. TAKES AT SUPPER) 30 tablet 3 06/25/2018 at 1700    Assessment: 84 year old woman with a history of coronary artery disease and CABG as well as aortic valve replacement.  Home dose is 2.5mg  every day except , 5mg  on Saturday  Goal of Therapy:  INR goal 2.2-3.0 Monitor platelets by anticoagulation protocol: Yes   Plan:  Coumadin 2.5mg  x 1 dose today PT-INR daily Monitor for S/S for bleeding  Elder CyphersLorie Gery Sabedra, BS Loura BackPharm D, BCPS Clinical Pharmacist Pager [phone] 06/27/2018,11:58 AM

## 2018-06-27 NOTE — Consult Note (Signed)
Cardiology Consultation:   Patient ID: [name]; [id number]; [birth year]   Admit date: 06/26/2018 Date of Consult: 06/27/2018  Primary Care Provider: Kari BaarsHawkins, Edward, MD Primary Cardiologist: Dr. Olga MillersBrian Crenshaw   Patient Profile:   [name] is a 84 y.o. female with a hx of coronary artery disease and valvular heart disease who is being seen today for the evaluation of acute diastolic heart failure at the request of Dr. Juanetta GoslingHawkins.  History of Present Illness:   [name] is a very pleasant 84 year old woman with a history of coronary artery disease and CABG as well as aortic valve replacement.  She last saw Dr. Jens Somrenshaw in the office on 01/18/2018.  It was noted that at the time of her last cardiac catheterization in 2011, all grafts were found to be occluded.  It also showed normal left main, 30% first diagonal, 50% LAD and 60% second diagonal.  There is a 40 to 50% circumflex and a 40% right coronary artery stenosis.  Medical therapy was recommended.  An echocardiogram was performed on 01/26/2018 and showed normal left ventricular systolic function, LVEF 50 to 40%55%, grade 1 diastolic dysfunction, bioprosthetic aortic valve with trivial regurgitation, severe mitral valve annular calcification, moderate to severe left atrial dilatation, and mild to moderate pulmonary hypertension, PA pressure is 39 mmHg.  She told me she saw Dr. Juanetta GoslingHawkins in his office last week complaining of some pain in her right thigh.  CT of the hip performed yesterday showed no acute fracture or dislocation.  There were degenerative changes and a small right hip joint effusion.  There was also a small right inguinal hernia containing small bowel without apparent proximal obstruction.  She told me she was walking to the bathroom from her bedroom using her "[place] walker ".  She used the bathroom and was walking back and said the walker collapsed and she fell backward on her buttocks.  She became mildly short of  breath which got worse and was then brought to the ED.  She denies chest pain.  She was given IV Lasix in the ED and was then started on IV Lasix 20 mill grams twice daily and told me that her breathing has significantly improved.  She currently denies chest pain and palpitations as well as leg swelling, orthopnea, and paroxysmal nocturnal dyspnea.  Chest x-ray performed on admission showed cardiomegaly with vascular congestion and mild interstitial pulmonary edema and small bilateral pleural effusions.  Additional labs: Hemoglobin today 9.2, troponins this morning 0.062, BNP markedly elevated 1329.  BUN and creatinine were normal on admission, 23 and 0.94 respectively.  I personally reviewed the ECG performed on admission which demonstrates sinus rhythm with left bundle branch block (chronic).       Past Medical History:  Diagnosis Date  . Carotid artery stenosis    Without infarction  . Coronary artery disease   . Heart valve replaced by other means   . Hypercholesterolemia    Pure  . Hypertension    Unspecified  . LBBB (left bundle branch block)   . Macular degeneration (senile) of retina, unspecified   . Postsurgical aortocoronary bypass status   . Stroke (HCC)   . Transient global amnesia   . Unspecified hereditary and idiopathic peripheral neuropathy   . Unspecified vitamin D deficiency     Past Surgical History:  Procedure Laterality Date  . AORTIC VALVE REPLACEMENT    . APPENDECTOMY    . CORONARY ARTERY BYPASS GRAFT    . TONSILLECTOMY  Inpatient Medications: Scheduled Meds: . ALPRAZolam  0.25 mg Oral QHS  . aspirin EC  81 mg Oral QPM  . atorvastatin  40 mg Oral q morning - 10a  . baclofen  10 mg Oral BID  . benazepril  40 mg Oral q morning - 10a  . carvedilol  6.25 mg Oral BID WC  . donepezil  10 mg Oral QHS  . furosemide  20 mg Intravenous BID  . potassium chloride  10 mEq Oral q morning - 10a  . pregabalin  75 mg Oral QODAY  . Vitamin D  (Ergocalciferol)  50,000 Units Oral Q Tue   Continuous Infusions:  PRN Meds: acetaminophen **OR** acetaminophen, ondansetron **OR** ondansetron (ZOFRAN) IV, traZODone  Allergies:    Allergies  Allergen Reactions  . Codeine   . Penicillins     Social History:   Social History   Socioeconomic History  . Marital status: Widowed    Spouse name: Not on file  . Number of children: 2  . Years of education: 12th  . Highest education level: Not on file  Occupational History    Employer: RETIRED  Social Needs  . Financial resource strain: Not on file  . Food insecurity:    Worry: Not on file    Inability: Not on file  . Transportation needs:    Medical: Not on file    Non-medical: Not on file  Tobacco Use  . Smoking status: Former Smoker    Packs/day: 0.50    Types: Cigarettes    Last attempt to quit: 11/30/1991    Years since quitting: 26.5  . Smokeless tobacco: Never Used  . Tobacco comment: Tobacco use-no  Substance and Sexual Activity  . Alcohol use: No  . Drug use: No  . Sexual activity: Never  Lifestyle  . Physical activity:    Days per week: Not on file    Minutes per session: Not on file  . Stress: Not on file  Relationships  . Social connections:    Talks on phone: Not on file    Gets together: Not on file    Attends religious service: Not on file    Active member of club or organization: Not on file    Attends meetings of clubs or organizations: Not on file    Relationship status: Not on file  . Intimate partner violence:    Fear of current or ex partner: Not on file    Emotionally abused: Not on file    Physically abused: Not on file    Forced sexual activity: Not on file  Other Topics Concern  . Not on file  Social History Narrative   Pt lives at home alone.   Caffeine Use: very rarely    Family History:    Family History  Problem Relation Age of Onset  . Heart attack Father   . Stroke Sister   . Neuropathy Brother   . COPD Sister       ROS:  Please see the history of present illness.  All other ROS reviewed and negative.     Physical Exam/Data:   Vitals:   06/27/18 0500 06/27/18 0600 06/27/18 0729 06/27/18 0730  BP: (!) 145/42 (!) 134/47  (!) 146/46  Pulse: (!) 50 (!) 51 (!) 59 (!) 54  Resp: 15 16 19 20   Temp:   98.3 F (36.8 C)   TempSrc:   Oral   SpO2: 100% 100% 99% 99%  Weight:      Height:  Intake/Output Summary (Last 24 hours) at 06/27/2018 1059 Last data filed at 06/27/2018 0828 Gross per 24 hour  Intake 100 ml  Output 50 ml  Net 50 ml   Filed Weights   06/26/18 1840 06/27/18 0202 06/27/18 0435  Weight: 48.1 kg 46.1 kg 46 kg   Body mass index is 16.37 kg/m.  General:  Well nourished, well developed, in no acute distress HEENT: normal Lymph: no adenopathy Neck: no JVD Endocrine:  No thryomegaly Vascular: No carotid bruits; FA pulses 2+ bilaterally without bruits  Cardiac:  normal S1, S2; RRR; 2/6 systolic murmur loudest over RUSB Lungs:  Diminished sounds b/l with bibasilar rales  Abd: soft, nontender, no hepatomegaly  Ext: no edema Musculoskeletal:  No deformities, BUE and BLE strength normal and equal Skin: warm and dry  Neuro:  CNs 2-12 intact, no focal abnormalities noted Psych:  Normal affect   EKG:  The EKG was personally reviewed and demonstrates: Sinus rhythm with left bundle branch block Telemetry:  Telemetry was personally reviewed and demonstrates: Sinus rhythm  Relevant CV Studies: Echocardiogram from March 2019 reviewed above  Laboratory Data:  Chemistry Recent Labs  Lab 06/26/18 1935  NA 143  K 4.3  CL 106  CO2 31  GLUCOSE 112*  BUN 23  CREATININE 0.94  CALCIUM 9.1  GFRNONAA 52*  GFRAA >60  ANIONGAP 6    Recent Labs  Lab 06/26/18 1935  PROT 6.5  ALBUMIN 3.5  AST 36  ALT 24  ALKPHOS 73  BILITOT 0.7   Hematology Recent Labs  Lab 06/26/18 1935 06/27/18 0614  WBC 8.9 7.9  RBC 2.99* 2.95*  HGB 9.0* 9.2*  HCT 29.3* 28.6*  MCV 98.0 96.9   MCH 30.1 31.2  MCHC 30.7 32.2  RDW 14.0 14.0  PLT 193 207   Cardiac Enzymes Recent Labs  Lab 06/27/18 0018 06/27/18 0614  TROPONINI 0.06* 0.06*    Recent Labs  Lab 06/26/18 2136  TROPIPOC 0.02    BNP Recent Labs  Lab 06/26/18 1935  BNP 1,329.0*    DDimer No results for input(s): DDIMER in the last 168 hours.  Radiology/Studies:  Dg Chest 2 View  Result Date: 06/26/2018 CLINICAL DATA:  Weakness, right hip pain EXAM: CHEST - 2 VIEW COMPARISON:  03/10/2017, CT chest 03/28/2018 FINDINGS: Sternotomy changes. Patient is rotated. Small bilateral pleural effusions. Hazy airspace disease at the bases. Cardiomegaly with vascular congestion and mild diffuse interstitial opacity consistent with edema. Aortic atherosclerosis. No pneumothorax. Old right-sided rib deformities. Emphysematous disease. IMPRESSION: 1. Cardiomegaly with vascular congestion, mild interstitial pulmonary edema, and small bilateral pleural effusions. 2. Hazy airspace disease at the bases may reflect atelectasis or edema. Electronically Signed   By: Jasmine PangKim  Fujinaga M.D.   On: 06/26/2018 20:11   Ct Hip Right Wo Contrast  Result Date: 06/26/2018 CLINICAL DATA:  Right hip pain and shortness of breath. Abnormal radiographs of right hip. EXAM: CT OF THE RIGHT HIP WITHOUT CONTRAST TECHNIQUE: Multidetector CT imaging of the right hip was performed according to the standard protocol. Multiplanar CT image reconstructions were also generated. COMPARISON:  Right hip radiographs 06/26/2018 FINDINGS: Bones/Joint/Cartilage Degenerative changes in the right hip with osteophytes on the acetabular and femoral surfaces. No evidence of acute fracture or dislocation of the right hip. Plain radiographic finding of cortical step-off likely represented osteophyte formation. Old healed fracture deformities are demonstrated in the right superior and inferior pubic rami. There is a small right hip joint effusion. Ligaments Suboptimally assessed by CT.  Muscles and  Tendons Mild muscle atrophy. No intramuscular mass or hematoma suggested. Soft tissues The bladder is distended, possibly physiologic or possibly due to urinary retention. Vascular calcifications are present. There is a small right inguinal hernia containing small bowel without apparent proximal obstruction. IMPRESSION: No acute fracture or dislocation of the right hip. Degenerative changes in the right hip. Small right hip joint effusion. Small right inguinal hernia containing small bowel without apparent proximal obstruction. Electronically Signed   By: Burman Nieves M.D.   On: 06/26/2018 23:56   Dg Hip Unilat With Pelvis 2-3 Views Right  Result Date: 06/26/2018 CLINICAL DATA:  Right hip and groin pain after a fall EXAM: DG HIP (WITH OR WITHOUT PELVIS) 2-3V RIGHT COMPARISON:  None. FINDINGS: Limited evaluation of the right femoral neck due to superimposition of trochanter. Possible step-off deformity at the right femoral head neck junction. Old appearing fracture deformity of the right superior and inferior pubic rami. IMPRESSION: 1. Limited evaluation of femoral neck due to positioning, questionable step-off deformity at the right femoral head neck junction, suggest CT to better evaluate. 2. Old appearing fractures of the right superior and inferior pubic rami. Electronically Signed   By: Jasmine Pang M.D.   On: 06/26/2018 20:13    Assessment and Plan:   1.  Acute diastolic heart failure: She is symptomatically improved.  She has already lost 2 kg if weights are accurate.  Continue IV Lasix 20 mg twice daily.  Blood pressure is mildly elevated.  2.  Coronary disease with CABG: Cardiac catheterization from 2011 reviewed above demonstrating occlusion of all grafts.  Continue medical therapy with aspirin, atorvastatin, benazepril, and carvedilol.  3.  Hypertension: Blood pressure is mildly elevated.  This will need continued monitoring in light of current diuretic requirement.  4.   Hyperlipidemia: Continue atorvastatin.  5.  Status post AVR: Normal bioprosthetic function by echocardiogram in March 2019.  No need to repeat.  6.  Bilateral carotid artery disease: 1 to 39% bilateral internal carotid artery stenosis by Dopplers on 06/21/2018.  Follow-up study planned for 1 year.   For questions or updates, please contact [facility] Please consult [email or web address] for contact info under Cardiology/STEMI.   Signed, Prentice Docker, MD  06/27/2018 10:59 AM

## 2018-06-27 NOTE — H&P (Signed)
History and Physical    [name] HYQ:[id number] DOB: 12/14/1925 DOA: 06/26/2018  PCP: Kari BaarsHawkins, Edward, MD   Patient coming from: Home.  I have personally briefly reviewed patient's [facility]  Chief Complaint: Fall.  HPI: [name] is a 84 y.o. female with medical history significant of carotid artery disease, coronary artery disease, LBBB, aortic valve replacement, hyperlipidemia, hypertension, macular degeneration, history of CVA, history of transient global amnesia, unspecified peripheral neuropathy, vitamin D deficiency who is brought to the emergency department after having a fall at home.  Then this afternoon, the patient mentation decrease and she became tachypneic.  EMS was subsequently called.  There has been no fever, chills, sore throat, hemoptysis, chest pain, palpitations, dizziness, diaphoresis, PND or orthopnea.  No abdominal pain, nausea, emesis, diarrhea or constipation.  No dysuria, frequency or hematuria.  No heat or cold intolerance.  No polyuria, polydipsia, polyphagia.  No pruritus.  ED Course: Initial vital signs temperature 99.2 F, pulse 70, respirations 14, blood pressure 160/84 mmHg and O2 sat 100%.  She was given furosemide 40 mg IVP, supplemental oxygen, BiPAP ventilation and nebulizer treatment.  BiPAP ventilation was subsequently taper off.  White count was 8.9, hemoglobin 9.0 g/dL and platelets [id number].  Her CMP showed a glucose of 112 mg/dL, but all other values were normal.  Initial troponin 0 0.02, follow-up 0.06 ng/mL.  BNP was 1329.0 pg/mL.  EKG was sinus rhythm with no nonspecific IVCD delay and repolarization abnormality.  Her chest x-ray showed cardiomegaly with vascular congestion, mild interstitial pulmonary edema and small bilateral pleural effusions.  There was haziness airspace disease at the bases which may reflect atelectasis or airspace disease.  Right hip x-ray and CT of the right hip did not show any fractures.   Please see images and full radiology report for further detail.  Review of Systems: As per HPI otherwise 10 point review of systems negative.  Past Medical History:  Diagnosis Date  . Carotid artery stenosis    Without infarction  . Coronary artery disease   . Heart valve replaced by other means   . Hypercholesterolemia    Pure  . Hypertension    Unspecified  . LBBB (left bundle branch block)   . Macular degeneration (senile) of retina, unspecified   . Postsurgical aortocoronary bypass status   . Stroke (HCC)   . Transient global amnesia   . Unspecified hereditary and idiopathic peripheral neuropathy   . Unspecified vitamin D deficiency     Past Surgical History:  Procedure Laterality Date  . AORTIC VALVE REPLACEMENT    . APPENDECTOMY    . CORONARY ARTERY BYPASS GRAFT    . TONSILLECTOMY       reports that she quit smoking about 26 years ago. Her smoking use included cigarettes. She smoked 0.50 packs per day. She has never used smokeless tobacco. She reports that she does not drink alcohol or use drugs.  Allergies  Allergen Reactions  . Codeine   . Penicillins     Family History  Problem Relation Age of Onset  . Heart attack Father   . Stroke Sister   . Neuropathy Brother   . COPD Sister     Prior to Admission medications   Medication Sig Start Date End Date Taking? Authorizing Provider  acetaminophen (TYLENOL 8 HOUR) 650 MG CR tablet Take 650 mg by mouth 2 (two) times daily.   Yes [provider]  ALPRAZolam Prudy Feeler(XANAX) 0.25 MG tablet Take  0.25 mg by mouth at bedtime.    Yes [provider]  aspirin 81 MG EC tablet Take 81 mg by mouth every evening.    Yes [provider]  atorvastatin (LIPITOR) 40 MG tablet Take 1 tablet (40 mg total) by mouth daily. Patient taking differently: Take 40 mg by mouth every morning.  02/14/18  Yes Lewayne Bunting, MD  baclofen (LIORESAL) 10 MG tablet Take 10 mg by mouth 2 (two) times daily.   Yes [provider]  benazepril (LOTENSIN) 40 MG tablet TAKE (1) TABLET BY MOUTH ONCE DAILY. Patient taking differently: Take 40 mg by mouth every morning.  03/07/18  Yes Lewayne Bunting, MD  carvedilol (COREG) 6.25 MG tablet Take 6.25 mg by mouth 2 (two) times daily with a meal.   Yes [provider]  donepezil (ARICEPT) 10 MG tablet TAKE ONE TABLET BY MOUTH ONCE DAILY. Patient taking differently: Take 10 mg by mouth at bedtime.  08/12/15  Yes Penumalli, Glenford Bayley, MD  ergocalciferol (VITAMIN D2) 50000 UNITS capsule Take 50,000 Units by mouth every Tuesday.    Yes [provider]  ferrous sulfate 325 (65 FE) MG EC tablet Take 325 mg by mouth as needed.    Yes [provider]  furosemide (LASIX) 20 MG tablet TAKE (1) TABLET BY MOUTH ONCE DAILY. Patient taking differently: Take 20 mg by mouth every morning.  05/05/18  Yes Crenshaw, Madolyn Frieze, MD  LYRICA 75 MG capsule TAKE ONE CAPSULE BY MOUTH ONCE DAILY. Patient taking differently: Take 75 mg by mouth every other day. AT BEDTIME 06/13/18  Yes Darreld Mclean, MD  meclizine (ANTIVERT) 25 MG tablet Take 25 mg by mouth 3 (three) times daily as needed for dizziness.   Yes [provider]  Melatonin 3 MG TABS Take 3 mg by mouth at bedtime.     Yes [provider]  Multiple Vitamins-Minerals (VITEYES AREDS FORMULA PO) Take 1 tablet by mouth daily.   Yes [provider]  potassium chloride (K-DUR) 10 MEQ tablet TAKE ONE TABLET BY MOUTH ONCE DAILY. Patient taking differently: Take 10 mEq by mouth every morning.  06/03/16  Yes Crenshaw, Madolyn Frieze, MD  traMADol (ULTRAM) 50 MG tablet Take 50 mg by mouth every 12 (twelve) hours as needed for moderate pain or severe pain.  03/28/13  Yes [provider]  traZODone (DESYREL) 50 MG tablet Take 50 mg by mouth at bedtime as needed for sleep.  11/29/12  Yes [provider]  umeclidinium-vilanterol (ANORO ELLIPTA) 62.5-25 MCG/INH AEPB Inhale 1 puff into the lungs  every morning.   Yes [provider]  warfarin (COUMADIN) 5 MG tablet TAKE 1/2 TO 1 TABLET DAILY AS DIRECTED BY COUMADIN CLINIC. Patient taking differently: Take 2.5-5 mg by mouth See admin instructions. 2.5MG  ON ALL DAYS EXCEPT 5MG  ON SATURDAYS ONLY. TAKES AT SUPPER 05/15/18  Yes Lewayne Bunting, MD    Physical Exam: Vitals:   06/26/18 2308 06/26/18 2330 06/27/18 0000 06/27/18 0030  BP:  (!) 168/143  (!) 152/61  Pulse:   66 64  Resp:  (!) 24 (!) 27 (!) 22  Temp:      TempSrc:      SpO2: 95%  95% 96%  Weight:      Height:        Constitutional: NAD, calm, comfortable Eyes: PERRL, lids and conjunctivae normal ENMT: Mucous membranes are moist. Posterior pharynx clear of any exudate or lesions. Neck: normal, supple, no masses, no  thyromegaly Respiratory: Bibasilar crackles.  Mild wheezing.  Normal respiratory effort. No accessory muscle use.  Cardiovascular: Regular rate and rhythm, positive aortic valve click, no rubs / gallops.  Trace lower extremities edema. 2+ pedal pulses. No carotid bruits.  Abdomen: Soft, no tenderness, no masses palpated. No hepatosplenomegaly. Bowel sounds positive.  Musculoskeletal: no clubbing / cyanosis.  Positive tenderness on right hip area. Good ROM, no contractures. Normal muscle tone.  Skin: no rashes, lesions, ulcers on limited dermatological examination Neurologic: CN 2-12 grossly intact. Sensation intact, DTR normal. Strength 5/5 in all 4.  Psychiatric: Normal judgment and insight. Alert and oriented x 3. Normal mood.    Labs on Admission: I have personally reviewed following labs and imaging studies  CBC: Recent Labs  Lab 06/26/18 1935  WBC 8.9  NEUTROABS 7.0  HGB 9.0*  HCT 29.3*  MCV 98.0  PLT 193   Basic Metabolic Panel: Recent Labs  Lab 06/26/18 1935  NA 143  K 4.3  CL 106  CO2 31  GLUCOSE 112*  BUN 23  CREATININE 0.94  CALCIUM 9.1   GFR: Estimated Creatinine Clearance: 30.8 mL/min (by C-G formula based on SCr of  0.94 mg/dL). Liver Function Tests: Recent Labs  Lab 06/26/18 1935  AST 36  ALT 24  ALKPHOS 73  BILITOT 0.7  PROT 6.5  ALBUMIN 3.5   No results for input(s): LIPASE, AMYLASE in the last 168 hours. No results for input(s): AMMONIA in the last 168 hours. Coagulation Profile: Recent Labs  Lab 06/26/18 1935  INR 2.65   Cardiac Enzymes: No results for input(s): CKTOTAL, CKMB, CKMBINDEX, TROPONINI in the last 168 hours. BNP (last 3 results) No results for input(s): PROBNP in the last 8760 hours. HbA1C: No results for input(s): HGBA1C in the last 72 hours. CBG: No results for input(s): GLUCAP in the last 168 hours. Lipid Profile: No results for input(s): CHOL, HDL, LDLCALC, TRIG, CHOLHDL, LDLDIRECT in the last 72 hours. Thyroid Function Tests: No results for input(s): TSH, T4TOTAL, FREET4, T3FREE, THYROIDAB in the last 72 hours. Anemia Panel: No results for input(s): VITAMINB12, FOLATE, FERRITIN, TIBC, IRON, RETICCTPCT in the last 72 hours. Urine analysis:    Component Value Date/Time   COLORURINE YELLOW 11/25/2012 0010   APPEARANCEUR CLEAR 11/25/2012 0010   LABSPEC 1.025 11/25/2012 0010   PHURINE 5.5 11/25/2012 0010   GLUCOSEU NEGATIVE 11/25/2012 0010   GLUCOSEU NEGATIVE 04/10/2008 1246   HGBUR MODERATE (A) 11/25/2012 0010   BILIRUBINUR NEGATIVE 11/25/2012 0010   KETONESUR NEGATIVE 11/25/2012 0010   PROTEINUR NEGATIVE 11/25/2012 0010   UROBILINOGEN 0.2 11/25/2012 0010   NITRITE NEGATIVE 11/25/2012 0010   LEUKOCYTESUR NEGATIVE 11/25/2012 0010    Radiological Exams on Admission: Dg Chest 2 View  Result Date: 06/26/2018 CLINICAL DATA:  Weakness, right hip pain EXAM: CHEST - 2 VIEW COMPARISON:  03/10/2017, CT chest 03/28/2018 FINDINGS: Sternotomy changes. Patient is rotated. Small bilateral pleural effusions. Hazy airspace disease at the bases. Cardiomegaly with vascular congestion and mild diffuse interstitial opacity consistent with edema. Aortic atherosclerosis. No  pneumothorax. Old right-sided rib deformities. Emphysematous disease. IMPRESSION: 1. Cardiomegaly with vascular congestion, mild interstitial pulmonary edema, and small bilateral pleural effusions. 2. Hazy airspace disease at the bases may reflect atelectasis or edema. Electronically Signed   By: Jasmine Pang M.D.   On: 06/26/2018 20:11   Ct Hip Right Wo Contrast  Result Date: 06/26/2018 CLINICAL DATA:  Right hip pain and shortness of breath. Abnormal radiographs of right hip. EXAM: CT OF THE RIGHT  HIP WITHOUT CONTRAST TECHNIQUE: Multidetector CT imaging of the right hip was performed according to the standard protocol. Multiplanar CT image reconstructions were also generated. COMPARISON:  Right hip radiographs 06/26/2018 FINDINGS: Bones/Joint/Cartilage Degenerative changes in the right hip with osteophytes on the acetabular and femoral surfaces. No evidence of acute fracture or dislocation of the right hip. Plain radiographic finding of cortical step-off likely represented osteophyte formation. Old healed fracture deformities are demonstrated in the right superior and inferior pubic rami. There is a small right hip joint effusion. Ligaments Suboptimally assessed by CT. Muscles and Tendons Mild muscle atrophy. No intramuscular mass or hematoma suggested. Soft tissues The bladder is distended, possibly physiologic or possibly due to urinary retention. Vascular calcifications are present. There is a small right inguinal hernia containing small bowel without apparent proximal obstruction. IMPRESSION: No acute fracture or dislocation of the right hip. Degenerative changes in the right hip. Small right hip joint effusion. Small right inguinal hernia containing small bowel without apparent proximal obstruction. Electronically Signed   By: Burman NievesWilliam  Stevens M.D.   On: 06/26/2018 23:56   Dg Hip Unilat With Pelvis 2-3 Views Right  Result Date: 06/26/2018 CLINICAL DATA:  Right hip and groin pain after a fall EXAM: DG  HIP (WITH OR WITHOUT PELVIS) 2-3V RIGHT COMPARISON:  None. FINDINGS: Limited evaluation of the right femoral neck due to superimposition of trochanter. Possible step-off deformity at the right femoral head neck junction. Old appearing fracture deformity of the right superior and inferior pubic rami. IMPRESSION: 1. Limited evaluation of femoral neck due to positioning, questionable step-off deformity at the right femoral head neck junction, suggest CT to better evaluate. 2. Old appearing fractures of the right superior and inferior pubic rami. Electronically Signed   By: Jasmine PangKim  Fujinaga M.D.   On: 06/26/2018 20:13    EKG: Independently reviewed.  Vent. rate 72 BPM PR interval * ms QRS duration 175 ms QT/QTc 431/472 ms P-R-T axes 37 104 -19 Sinus rhythm Nonspecific intraventricular conduction delay Repol abnrm, global ischemia, diffuse leads  Assessment/Plan Principal Problem:   Acute diastolic heart failure (HCC) Observation/ICU. Continue supplemental oxygen. BiPAP ventilation as needed. Continue ACE inhibitor. Resume carvedilol in the morning. Furosemide 20 mg IVP twice daily. Monitor daily weights, intake and output. Bronchodilators as needed.  Active Problems:   Hyperlipidemia Continue atorvastatin 40 mg p.o. daily. Monitor LFTs periodically. Fasting lipid profile follow-up as an outpatient.    Essential hypertension Continue benazepril 40 mg p.o. daily. Continue carvedilol 6.25 mg p.o. twice daily. Monitor blood pressure, heart rate, renal function and electrolytes.    CAD, ARTERY BYPASS GRAFT No chest pain at this time. Continue aspirin, atorvastatin and carvedilol.    S/P AVR (aortic valve replacement) Daily PT/INR while in the hospital. Warfarin per pharmacy.    Dementia Supportive care. Continue donezepil 10 mg p.o. daily at bedtime.    Anemia Monitor hematocrit and hemoglobin.     DVT prophylaxis: On warfarin. Code Status: Full code. Family Communication:   Disposition Plan: Observation stepdown for acute diastolic CHF and COPD exacerbation Consults called:  Admission status: Observation/stepdown.   Bobette Moavid Manuel Karesha Trzcinski MD [facility] Pager [phone].  If 7PM-7AM, please contact night-coverage [email or web address] Password [email or web address]  06/27/2018, 12:49 AM

## 2018-06-27 NOTE — Progress Notes (Addendum)
Subjective: She is here with heart failure.  She is known to have chronic heart failure in the past and she is had valve replacement.  She also has COPD.  I had seen her in my office last week and she was complaining of some pain in her right thigh.  She did not had any trauma but he did seem to be giving her more issues.  Objective: Vital signs in last 24 hours: Temp:  [97.7 F (36.5 C)-99.2 F (37.3 C)] 98.3 F (36.8 C) (08/13 0729) Pulse Rate:  [50-84] 54 (08/13 0730) Resp:  [14-27] 20 (08/13 0730) BP: (134-177)/(42-143) 146/46 (08/13 0730) SpO2:  [90 %-100 %] 99 % (08/13 0730) FiO2 (%):  [36 %] 36 % (08/13 0200) Weight:  [46 kg-48.1 kg] 46 kg (08/13 0435) Weight change:     Intake/Output from previous day: 08/12 0701 - 08/13 0700 In: -  Out: 50 [Urine:50]  PHYSICAL EXAM General appearance: alert, cooperative and mild distress Resp: rales bilaterally Cardio: Regular rate with abnormal heart sounds GI: soft, non-tender; bowel sounds normal; no masses,  no organomegaly Extremities: extremities normal, atraumatic, no cyanosis or edema  Lab Results:  Results for orders placed or performed during the hospital encounter of 06/26/18 (from the past 48 hour(s))  CBC with Differential/Platelet     Status: Abnormal   Collection Time: 06/26/18  7:35 PM  Result Value Ref Range   WBC 8.9 4.0 - 10.5 K/uL   RBC 2.99 (L) 3.87 - 5.11 MIL/uL   Hemoglobin 9.0 (L) 12.0 - 15.0 g/dL   HCT 29.3 (L) 36.0 - 46.0 %   MCV 98.0 78.0 - 100.0 fL   MCH 30.1 26.0 - 34.0 pg   MCHC 30.7 30.0 - 36.0 g/dL   RDW 14.0 11.5 - 15.5 %   Platelets 193 150 - 400 K/uL   Neutrophils Relative % 79 %   Neutro Abs 7.0 1.7 - 7.7 K/uL   Lymphocytes Relative 10 %   Lymphs Abs 0.9 0.7 - 4.0 K/uL   Monocytes Relative 9 %   Monocytes Absolute 0.8 0.1 - 1.0 K/uL   Eosinophils Relative 2 %   Eosinophils Absolute 0.2 0.0 - 0.7 K/uL   Basophils Relative 0 %   Basophils Absolute 0.0 0.0 - 0.1 K/uL    Comment:  Performed at [facility], [address], [place],  [place] [address]  Comprehensive metabolic panel     Status: Abnormal   Collection Time: 06/26/18  7:35 PM  Result Value Ref Range   Sodium 143 135 - 145 mmol/L   Potassium 4.3 3.5 - 5.1 mmol/L   Chloride 106 98 - 111 mmol/L   CO2 31 22 - 32 mmol/L   Glucose, Bld 112 (H) 70 - 99 mg/dL   BUN 23 8 - 23 mg/dL   Creatinine, Ser 0.94 0.44 - 1.00 mg/dL   Calcium 9.1 8.9 - 10.3 mg/dL   Total Protein 6.5 6.5 - 8.1 g/dL   Albumin 3.5 3.5 - 5.0 g/dL   AST 36 15 - 41 U/L   ALT 24 0 - 44 U/L   Alkaline Phosphatase 73 38 - 126 U/L   Total Bilirubin 0.7 0.3 - 1.2 mg/dL   GFR calc non Af Amer 52 (L) >60 mL/min   GFR calc Af Amer >60 >60 mL/min    Comment: (NOTE) The eGFR has been calculated using the CKD EPI equation. This calculation has not been validated in all clinical situations. eGFR's persistently <60 mL/min signify possible Chronic  Kidney Disease.    Anion gap 6 5 - 15    Comment: Performed at [facility], [address], [place], [place] [address]  Brain natriuretic peptide     Status: Abnormal   Collection Time: 06/26/18  7:35 PM  Result Value Ref Range   B Natriuretic Peptide 1,329.0 (H) 0.0 - 100.0 pg/mL    Comment: Performed at [facility], [address] [place], [place] [address]  Protime-INR     Status: Abnormal   Collection Time: 06/26/18  7:35 PM  Result Value Ref Range   Prothrombin Time 28.0 (H) 11.4 - 15.2 seconds   INR 2.65     Comment: Performed at [facility], [address], [place], [place] [address]  I-stat troponin, ED     Status: None   Collection Time: 06/26/18  9:36 PM  Result Value Ref Range   Troponin i, poc 0.02 0.00 - 0.08 ng/mL   Comment 3            Comment: Due to the release kinetics of cTnI, a negative result within the first hours of the onset of symptoms does not rule out myocardial infarction with certainty. If myocardial infarction is still suspected, repeat the test at appropriate  intervals.   Magnesium     Status: None   Collection Time: 06/27/18 12:18 AM  Result Value Ref Range   Magnesium 1.9 1.7 - 2.4 mg/dL    Comment: Performed at [facility], [address] [place], [place] [address]  Troponin I     Status: Abnormal   Collection Time: 06/27/18 12:18 AM  Result Value Ref Range   Troponin I 0.06 (HH) <0.03 ng/mL    Comment: CRITICAL RESULT CALLED TO, READ BACK BY AND VERIFIED WITH: NICKOLS,K. AT 0136 ON 06/27/2018 BY EVA Performed at [facility], [address] [place], [place] [address]   MRSA PCR Screening     Status: None   Collection Time: 06/27/18  1:52 AM  Result Value Ref Range   MRSA by PCR NEGATIVE NEGATIVE    Comment:        The GeneXpert MRSA Assay (FDA approved for NASAL specimens only), is one component of a comprehensive MRSA colonization surveillance program. It is not intended to diagnose MRSA infection nor to guide or monitor treatment for MRSA infections. Performed at [facility], [address], [place], [place] [address]   Troponin I     Status: Abnormal   Collection Time: 06/27/18  6:14 AM  Result Value Ref Range   Troponin I 0.06 (HH) <0.03 ng/mL    Comment: CRITICAL VALUE NOTED.  VALUE IS CONSISTENT WITH PREVIOUSLY REPORTED AND CALLED VALUE. Performed at [facility], [address], [place], [place] [address]   Protime-INR     Status: Abnormal   Collection Time: 06/27/18  6:14 AM  Result Value Ref Range   Prothrombin Time 24.4 (H) 11.4 - 15.2 seconds   INR 2.22     Comment: Performed at [facility], [address], [place], [place] [address]  CBC     Status: Abnormal   Collection Time: 06/27/18  6:14 AM  Result Value Ref Range   WBC 7.9 4.0 - 10.5 K/uL   RBC 2.95 (L) 3.87 - 5.11 MIL/uL   Hemoglobin 9.2 (L) 12.0 - 15.0 g/dL   HCT 28.6 (L) 36.0 - 46.0 %   MCV 96.9 78.0 - 100.0 fL   MCH 31.2 26.0 - 34.0 pg   MCHC 32.2 30.0 - 36.0  g/dL   RDW 14.0 11.5 - 15.5 %   Platelets 207 150 - 400 K/uL    Comment: Performed  at [facility], [address], [place], [place] [address]    ABGS No results for input(s): PHART, PO2ART, TCO2, HCO3 in the last 72 hours.  Invalid input(s): PCO2 CULTURES Recent Results (from the past 240 hour(s))  MRSA PCR Screening     Status: None   Collection Time: 06/27/18  1:52 AM  Result Value Ref Range Status   MRSA by PCR NEGATIVE NEGATIVE Final    Comment:        The GeneXpert MRSA Assay (FDA approved for NASAL specimens only), is one component of a comprehensive MRSA colonization surveillance program. It is not intended to diagnose MRSA infection nor to guide or monitor treatment for MRSA infections. Performed at [facility], [address], [place], [place] [address]    Studies/Results: Dg Chest 2 View  Result Date: 06/26/2018 CLINICAL DATA:  Weakness, right hip pain EXAM: CHEST - 2 VIEW COMPARISON:  03/10/2017, CT chest 03/28/2018 FINDINGS: Sternotomy changes. Patient is rotated. Small bilateral pleural effusions. Hazy airspace disease at the bases. Cardiomegaly with vascular congestion and mild diffuse interstitial opacity consistent with edema. Aortic atherosclerosis. No pneumothorax. Old right-sided rib deformities. Emphysematous disease. IMPRESSION: 1. Cardiomegaly with vascular congestion, mild interstitial pulmonary edema, and small bilateral pleural effusions. 2. Hazy airspace disease at the bases may reflect atelectasis or edema. Electronically Signed   By: Donavan Foil M.D.   On: 06/26/2018 20:11   Ct Hip Right Wo Contrast  Result Date: 06/26/2018 CLINICAL DATA:  Right hip pain and shortness of breath. Abnormal radiographs of right hip. EXAM: CT OF THE RIGHT HIP WITHOUT CONTRAST TECHNIQUE: Multidetector CT imaging of the right hip was performed according to the standard protocol. Multiplanar CT image reconstructions were also generated. COMPARISON:  Right hip radiographs 06/26/2018 FINDINGS: Bones/Joint/Cartilage Degenerative changes in the right hip with  osteophytes on the acetabular and femoral surfaces. No evidence of acute fracture or dislocation of the right hip. Plain radiographic finding of cortical step-off likely represented osteophyte formation. Old healed fracture deformities are demonstrated in the right superior and inferior pubic rami. There is a small right hip joint effusion. Ligaments Suboptimally assessed by CT. Muscles and Tendons Mild muscle atrophy. No intramuscular mass or hematoma suggested. Soft tissues The bladder is distended, possibly physiologic or possibly due to urinary retention. Vascular calcifications are present. There is a small right inguinal hernia containing small bowel without apparent proximal obstruction. IMPRESSION: No acute fracture or dislocation of the right hip. Degenerative changes in the right hip. Small right hip joint effusion. Small right inguinal hernia containing small bowel without apparent proximal obstruction. Electronically Signed   By: Lucienne Capers M.D.   On: 06/26/2018 23:56   Dg Hip Unilat With Pelvis 2-3 Views Right  Result Date: 06/26/2018 CLINICAL DATA:  Right hip and groin pain after a fall EXAM: DG HIP (WITH OR WITHOUT PELVIS) 2-3V RIGHT COMPARISON:  None. FINDINGS: Limited evaluation of the right femoral neck due to superimposition of trochanter. Possible step-off deformity at the right femoral head neck junction. Old appearing fracture deformity of the right superior and inferior pubic rami. IMPRESSION: 1. Limited evaluation of femoral neck due to positioning, questionable step-off deformity at the right femoral head neck junction, suggest CT to better evaluate. 2. Old appearing fractures of the right superior and inferior pubic rami. Electronically Signed   By: Donavan Foil M.D.   On:  06/26/2018 20:13    Medications:  Prior to Admission:  Medications Prior to Admission  Medication Sig Dispense Refill Last Dose  . acetaminophen (TYLENOL 8 HOUR) 650 MG CR tablet Take 650 mg by mouth 2  (two) times daily.   06/26/2018 at Unknown time  . ALPRAZolam (XANAX) 0.25 MG tablet Take 0.25 mg by mouth at bedtime.    06/25/2018 at Unknown time  . aspirin 81 MG EC tablet Take 81 mg by mouth every evening.    06/25/2018 at Unknown time  . atorvastatin (LIPITOR) 40 MG tablet Take 1 tablet (40 mg total) by mouth daily. (Patient taking differently: Take 40 mg by mouth every morning. ) 90 tablet 3 06/26/2018 at Unknown time  . baclofen (LIORESAL) 10 MG tablet Take 10 mg by mouth 2 (two) times daily.   06/26/2018 at Unknown time  . benazepril (LOTENSIN) 40 MG tablet TAKE (1) TABLET BY MOUTH ONCE DAILY. (Patient taking differently: Take 40 mg by mouth every morning. ) 90 tablet 1 06/26/2018 at Unknown time  . carvedilol (COREG) 6.25 MG tablet Take 6.25 mg by mouth 2 (two) times daily with a meal.   06/26/2018 at 9-930a  . donepezil (ARICEPT) 10 MG tablet TAKE ONE TABLET BY MOUTH ONCE DAILY. (Patient taking differently: Take 10 mg by mouth at bedtime. ) 30 tablet 0 06/25/2018 at Unknown time  . ergocalciferol (VITAMIN D2) 50000 UNITS capsule Take 50,000 Units by mouth every Tuesday.    Past Week at Unknown time  . ferrous sulfate 325 (65 FE) MG EC tablet Take 325 mg by mouth as needed.    Past Month at Unknown time  . furosemide (LASIX) 20 MG tablet TAKE (1) TABLET BY MOUTH ONCE DAILY. (Patient taking differently: Take 20 mg by mouth every morning. ) 30 tablet 5 06/26/2018 at Unknown time  . LYRICA 75 MG capsule TAKE ONE CAPSULE BY MOUTH ONCE DAILY. (Patient taking differently: Take 75 mg by mouth every other day. AT BEDTIME) 30 capsule 0 06/25/2018 at Unknown time  . meclizine (ANTIVERT) 25 MG tablet Take 25 mg by mouth 3 (three) times daily as needed for dizziness.   UNKNOWN  . Melatonin 3 MG TABS Take 3 mg by mouth at bedtime.     06/25/2018 at Unknown time  . Multiple Vitamins-Minerals (VITEYES AREDS FORMULA PO) Take 1 tablet by mouth daily.   Past Month at Unknown time  . potassium chloride (K-DUR) 10 MEQ  tablet TAKE ONE TABLET BY MOUTH ONCE DAILY. (Patient taking differently: Take 10 mEq by mouth every morning. ) 30 tablet 6 06/26/2018 at Unknown time  . traMADol (ULTRAM) 50 MG tablet Take 50 mg by mouth every 12 (twelve) hours as needed for moderate pain or severe pain.    UNKNOWN  . traZODone (DESYREL) 50 MG tablet Take 50 mg by mouth at bedtime as needed for sleep.    UNKNOWN  . umeclidinium-vilanterol (ANORO ELLIPTA) 62.5-25 MCG/INH AEPB Inhale 1 puff into the lungs every morning.   06/26/2018 at Unknown time  . warfarin (COUMADIN) 5 MG tablet TAKE 1/2 TO 1 TABLET DAILY AS DIRECTED BY COUMADIN CLINIC. (Patient taking differently: Take 2.5-5 mg by mouth See admin instructions. 2.5MG ON ALL DAYS EXCEPT 5MG ON SATURDAYS ONLY. TAKES AT SUPPER) 30 tablet 3 06/25/2018 at 1700   Scheduled: . ALPRAZolam  0.25 mg Oral QHS  . aspirin EC  81 mg Oral QPM  . atorvastatin  40 mg Oral q morning - 10a  . baclofen  10  mg Oral BID  . benazepril  40 mg Oral q morning - 10a  . carvedilol  6.25 mg Oral BID WC  . donepezil  10 mg Oral QHS  . furosemide  20 mg Intravenous BID  . potassium chloride  10 mEq Oral q morning - 10a  . pregabalin  75 mg Oral QODAY  . Vitamin D (Ergocalciferol)  50,000 Units Oral Q Tue   Continuous:  HRV:ACQPEAKLTYVDP **OR** acetaminophen, ondansetron **OR** ondansetron (ZOFRAN) IV, traZODone  Assesment: She was admitted with acute diastolic heart failure.  She has had aortic valve replacement.  She is being treated.  She still has hip pain and her x-ray was questionable but CT okay.  She has dementia at baseline and does well at home but she is a little more confused here Principal Problem:   Acute diastolic heart failure ([place]) Active Problems:   Hyperlipidemia   Essential hypertension   CAD, ARTERY BYPASS GRAFT   S/P AVR (aortic valve replacement)   Dementia   Anemia    Plan: Continue treatments.   Will request cardiology evaluation as well.    LOS: 0 days    Feleshia Zundel L 06/27/2018, 9:06 AM

## 2018-06-28 DIAGNOSIS — J439 Emphysema, unspecified: Secondary | ICD-10-CM | POA: Diagnosis present

## 2018-06-28 DIAGNOSIS — Z953 Presence of xenogenic heart valve: Secondary | ICD-10-CM | POA: Diagnosis not present

## 2018-06-28 DIAGNOSIS — J449 Chronic obstructive pulmonary disease, unspecified: Secondary | ICD-10-CM | POA: Diagnosis not present

## 2018-06-28 DIAGNOSIS — I779 Disorder of arteries and arterioles, unspecified: Secondary | ICD-10-CM

## 2018-06-28 DIAGNOSIS — M25559 Pain in unspecified hip: Secondary | ICD-10-CM | POA: Diagnosis not present

## 2018-06-28 DIAGNOSIS — I509 Heart failure, unspecified: Secondary | ICD-10-CM

## 2018-06-28 DIAGNOSIS — Y92230 Patient room in hospital as the place of occurrence of the external cause: Secondary | ICD-10-CM | POA: Diagnosis not present

## 2018-06-28 DIAGNOSIS — I5032 Chronic diastolic (congestive) heart failure: Secondary | ICD-10-CM | POA: Diagnosis not present

## 2018-06-28 DIAGNOSIS — M25551 Pain in right hip: Secondary | ICD-10-CM | POA: Diagnosis present

## 2018-06-28 DIAGNOSIS — R52 Pain, unspecified: Secondary | ICD-10-CM | POA: Diagnosis not present

## 2018-06-28 DIAGNOSIS — T465X5A Adverse effect of other antihypertensive drugs, initial encounter: Secondary | ICD-10-CM | POA: Diagnosis not present

## 2018-06-28 DIAGNOSIS — I6523 Occlusion and stenosis of bilateral carotid arteries: Secondary | ICD-10-CM | POA: Diagnosis present

## 2018-06-28 DIAGNOSIS — Z9981 Dependence on supplemental oxygen: Secondary | ICD-10-CM | POA: Diagnosis not present

## 2018-06-28 DIAGNOSIS — Z8673 Personal history of transient ischemic attack (TIA), and cerebral infarction without residual deficits: Secondary | ICD-10-CM | POA: Diagnosis not present

## 2018-06-28 DIAGNOSIS — H3530 Unspecified macular degeneration: Secondary | ICD-10-CM | POA: Diagnosis not present

## 2018-06-28 DIAGNOSIS — J441 Chronic obstructive pulmonary disease with (acute) exacerbation: Secondary | ICD-10-CM | POA: Diagnosis not present

## 2018-06-28 DIAGNOSIS — D649 Anemia, unspecified: Secondary | ICD-10-CM | POA: Diagnosis present

## 2018-06-28 DIAGNOSIS — E785 Hyperlipidemia, unspecified: Secondary | ICD-10-CM | POA: Diagnosis present

## 2018-06-28 DIAGNOSIS — F0390 Unspecified dementia without behavioral disturbance: Secondary | ICD-10-CM | POA: Diagnosis present

## 2018-06-28 DIAGNOSIS — R402360 Coma scale, best motor response, obeys commands, unspecified time: Secondary | ICD-10-CM | POA: Diagnosis present

## 2018-06-28 DIAGNOSIS — R402140 Coma scale, eyes open, spontaneous, unspecified time: Secondary | ICD-10-CM | POA: Diagnosis present

## 2018-06-28 DIAGNOSIS — I447 Left bundle-branch block, unspecified: Secondary | ICD-10-CM | POA: Diagnosis present

## 2018-06-28 DIAGNOSIS — I2510 Atherosclerotic heart disease of native coronary artery without angina pectoris: Secondary | ICD-10-CM | POA: Diagnosis present

## 2018-06-28 DIAGNOSIS — R262 Difficulty in walking, not elsewhere classified: Secondary | ICD-10-CM | POA: Diagnosis not present

## 2018-06-28 DIAGNOSIS — Z9181 History of falling: Secondary | ICD-10-CM | POA: Diagnosis not present

## 2018-06-28 DIAGNOSIS — I5033 Acute on chronic diastolic (congestive) heart failure: Secondary | ICD-10-CM | POA: Diagnosis present

## 2018-06-28 DIAGNOSIS — M6281 Muscle weakness (generalized): Secondary | ICD-10-CM | POA: Diagnosis not present

## 2018-06-28 DIAGNOSIS — R402250 Coma scale, best verbal response, oriented, unspecified time: Secondary | ICD-10-CM | POA: Diagnosis present

## 2018-06-28 DIAGNOSIS — Z681 Body mass index (BMI) 19 or less, adult: Secondary | ICD-10-CM | POA: Diagnosis not present

## 2018-06-28 DIAGNOSIS — L89620 Pressure ulcer of left heel, unstageable: Secondary | ICD-10-CM | POA: Diagnosis present

## 2018-06-28 DIAGNOSIS — I25708 Atherosclerosis of coronary artery bypass graft(s), unspecified, with other forms of angina pectoris: Secondary | ICD-10-CM | POA: Diagnosis not present

## 2018-06-28 DIAGNOSIS — Z951 Presence of aortocoronary bypass graft: Secondary | ICD-10-CM | POA: Diagnosis not present

## 2018-06-28 DIAGNOSIS — Z7901 Long term (current) use of anticoagulants: Secondary | ICD-10-CM | POA: Diagnosis not present

## 2018-06-28 DIAGNOSIS — K4090 Unilateral inguinal hernia, without obstruction or gangrene, not specified as recurrent: Secondary | ICD-10-CM | POA: Diagnosis present

## 2018-06-28 DIAGNOSIS — I10 Essential (primary) hypertension: Secondary | ICD-10-CM | POA: Diagnosis not present

## 2018-06-28 DIAGNOSIS — J9611 Chronic respiratory failure with hypoxia: Secondary | ICD-10-CM | POA: Diagnosis present

## 2018-06-28 DIAGNOSIS — I5031 Acute diastolic (congestive) heart failure: Secondary | ICD-10-CM | POA: Diagnosis not present

## 2018-06-28 DIAGNOSIS — I959 Hypotension, unspecified: Secondary | ICD-10-CM | POA: Diagnosis not present

## 2018-06-28 DIAGNOSIS — E441 Mild protein-calorie malnutrition: Secondary | ICD-10-CM | POA: Diagnosis present

## 2018-06-28 DIAGNOSIS — Z952 Presence of prosthetic heart valve: Secondary | ICD-10-CM | POA: Diagnosis not present

## 2018-06-28 DIAGNOSIS — I110 Hypertensive heart disease with heart failure: Secondary | ICD-10-CM | POA: Diagnosis present

## 2018-06-28 DIAGNOSIS — Y92009 Unspecified place in unspecified non-institutional (private) residence as the place of occurrence of the external cause: Secondary | ICD-10-CM | POA: Diagnosis not present

## 2018-06-28 DIAGNOSIS — R2689 Other abnormalities of gait and mobility: Secondary | ICD-10-CM | POA: Diagnosis not present

## 2018-06-28 DIAGNOSIS — W19XXXA Unspecified fall, initial encounter: Secondary | ICD-10-CM | POA: Diagnosis present

## 2018-06-28 LAB — BASIC METABOLIC PANEL
ANION GAP: 9 (ref 5–15)
BUN: 36 mg/dL — AB (ref 8–23)
CALCIUM: 8.6 mg/dL — AB (ref 8.9–10.3)
CO2: 32 mmol/L (ref 22–32)
Chloride: 101 mmol/L (ref 98–111)
Creatinine, Ser: 1.11 mg/dL — ABNORMAL HIGH (ref 0.44–1.00)
GFR calc Af Amer: 50 mL/min — ABNORMAL LOW (ref >60)
GFR, EST NON AFRICAN AMERICAN: 43 mL/min — AB (ref >60)
GLUCOSE: 104 mg/dL — AB (ref 70–99)
POTASSIUM: 3.8 mmol/L (ref 3.5–5.1)
SODIUM: 142 mmol/L (ref 135–145)

## 2018-06-28 LAB — CBC
HCT: 28.1 % — ABNORMAL LOW (ref 36.0–46.0)
Hemoglobin: 8.8 g/dL — ABNORMAL LOW (ref 12.0–15.0)
MCH: 30.3 pg (ref 26.0–34.0)
MCHC: 31.3 g/dL (ref 30.0–36.0)
MCV: 96.9 fL (ref 78.0–100.0)
PLATELETS: 206 10*3/uL (ref 150–400)
RBC: 2.9 MIL/uL — ABNORMAL LOW (ref 3.87–5.11)
RDW: 14.1 % (ref 11.5–15.5)
WBC: 11.3 10*3/uL — ABNORMAL HIGH (ref 4.0–10.5)

## 2018-06-28 LAB — PROTIME-INR
INR: 2.14
PROTHROMBIN TIME: 23.7 s — AB (ref 11.4–15.2)

## 2018-06-28 MED ORDER — HYDRALAZINE HCL 20 MG/ML IJ SOLN
10.0000 mg | Freq: Once | INTRAMUSCULAR | Status: AC
  Administered 2018-06-28: 10 mg via INTRAVENOUS
  Filled 2018-06-28: qty 1

## 2018-06-28 MED ORDER — POTASSIUM CHLORIDE CRYS ER 10 MEQ PO TBCR
10.0000 meq | EXTENDED_RELEASE_TABLET | Freq: Two times a day (BID) | ORAL | Status: DC
  Administered 2018-06-28 – 2018-07-01 (×7): 10 meq via ORAL
  Filled 2018-06-28 (×7): qty 1

## 2018-06-28 MED ORDER — WARFARIN SODIUM 2 MG PO TABS
3.0000 mg | ORAL_TABLET | Freq: Once | ORAL | Status: AC
  Administered 2018-06-28: 3 mg via ORAL
  Filled 2018-06-28: qty 1

## 2018-06-28 MED ORDER — WARFARIN SODIUM 2.5 MG PO TABS: 2.5000 mg | ORAL_TABLET | Freq: Once | ORAL | Status: DC

## 2018-06-28 MED ORDER — LEVALBUTEROL HCL 0.63 MG/3ML IN NEBU
0.6300 mg | INHALATION_SOLUTION | Freq: Three times a day (TID) | RESPIRATORY_TRACT | Status: DC
  Administered 2018-06-28 – 2018-06-29 (×6): 0.63 mg via RESPIRATORY_TRACT
  Filled 2018-06-28 (×6): qty 3

## 2018-06-28 MED ORDER — FUROSEMIDE 10 MG/ML IJ SOLN
40.0000 mg | Freq: Once | INTRAMUSCULAR | Status: AC
  Administered 2018-06-28: 40 mg via INTRAVENOUS
  Filled 2018-06-28: qty 4

## 2018-06-28 NOTE — NC FL2 (Signed)
[place] MEDICAID FL2 LEVEL OF CARE SCREENING TOOL     IDENTIFICATION  Patient Name: [name] Birthdate: 11/05/1926 Sex: female Admission Date (Current Location): 06/26/2018  [facility] [place]  [place]   Facility and Address:  [facility],  [address], [place] [id number]      Provider Number: [id number]  Attending Physician Name and Address:  Kari BaarsHawkins, Edward, MD  Relative Name and Phone Number:  Lisbeth PlyDonna Carter (daughter) [phone]    Current Level of Care: Hospital Recommended Level of Care: Skilled Nursing Facility Prior Approval Number:    Date Approved/Denied:   PASRR Number:    Discharge Plan: SNF    Current Diagnoses: Patient Active Problem List   Diagnosis Date Noted  . CHF (congestive heart failure) (HCC) 06/28/2018  . Acute diastolic heart failure (HCC) 06/27/2018  . Anemia 06/27/2018  . Congestive dilated cardiomyopathy (HCC) 09/29/2015  . Encounter for therapeutic drug monitoring 12/12/2013  . Memory loss 04/16/2013  . Transient confusion 04/16/2013  . Mild malnutrition (HCC) 11/28/2012  . Dehydration 11/27/2012  . Dementia 11/27/2012  . Pneumonia 11/24/2012  . Aortic valve disorders 01/13/2011  . Carotid artery stenosis 10/13/2009  . S/P AVR (aortic valve replacement) 10/13/2009  . Hyperlipidemia 03/06/2009  . Essential hypertension 03/06/2009  . CAD, ARTERY BYPASS GRAFT 03/06/2009  . LBBB 03/06/2009  . CVA 03/06/2009    Orientation RESPIRATION BLADDER Height & Weight     Self, Situation, Place  O2(see dc summary) Continent Weight: 104 lb 4.4 oz (47.3 kg) Height:  5\' 6"  (167.6 cm)  BEHAVIORAL SYMPTOMS/MOOD NEUROLOGICAL BOWEL NUTRITION STATUS      Continent Diet(see dc summary)  AMBULATORY STATUS COMMUNICATION OF NEEDS Skin   Extensive Assist Verbally Normal                       Personal Care Assistance Level of Assistance  Bathing, Feeding, Dressing Bathing Assistance: Limited  assistance Feeding assistance: Independent Dressing Assistance: Limited assistance     Functional Limitations Info  Sight, Hearing, Speech Sight Info: Adequate Hearing Info: Impaired(very HOH) Speech Info: Adequate    SPECIAL CARE FACTORS FREQUENCY  PT (By licensed PT)     PT Frequency: 5 times/week              Contractures Contractures Info: Not present    Additional Factors Info  Code Status, Allergies Code Status Info: full Allergies Info: Codeine, Penicillins           Current Medications (06/28/2018):  This is the current hospital active medication list Current Facility-Administered Medications  Medication Dose Route Frequency Provider Last Rate Last Dose  . acetaminophen (TYLENOL) tablet 650 mg  650 mg Oral Q6H PRN Bobette Mortiz, David Manuel, MD       Or  . acetaminophen (TYLENOL) suppository 650 mg  650 mg Rectal Q6H PRN Bobette Mortiz, David Manuel, MD      . ALPRAZolam Prudy Feeler(XANAX) tablet 0.25 mg  0.25 mg Oral QHS Bobette Mortiz, David Manuel, MD   0.25 mg at 06/27/18 2111  . aspirin EC tablet 81 mg  81 mg Oral QPM Bobette Mortiz, David Manuel, MD   81 mg at 06/27/18 1707  . atorvastatin (LIPITOR) tablet 40 mg  40 mg Oral q morning - 10a Bobette Mortiz, David Manuel, MD   40 mg at 06/28/18 1100  . baclofen (LIORESAL) tablet 10 mg  10 mg Oral BID Bobette Mortiz, David Manuel, MD   10 mg at 06/28/18 1100  . benazepril (LOTENSIN) tablet 40  mg  40 mg Oral q morning - 10a Bobette Mortiz, David Manuel, MD   40 mg at 06/28/18 1100  . carvedilol (COREG) tablet 6.25 mg  6.25 mg Oral BID WC Bobette Mortiz, David Manuel, MD   6.25 mg at 06/28/18 [id number]  . donepezil (ARICEPT) tablet 10 mg  10 mg Oral QHS Bobette Mortiz, David Manuel, MD   10 mg at 06/27/18 2111  . levalbuterol (XOPENEX) nebulizer solution 0.63 mg  0.63 mg Nebulization Q8H Kari BaarsHawkins, Edward, MD   0.63 mg at 06/28/18 0839  . ondansetron (ZOFRAN) tablet 4 mg  4 mg Oral Q6H PRN Bobette Mortiz, David Manuel, MD       Or  . ondansetron [facility](ZOFRAN) injection 4 mg  4 mg Intravenous Q6H PRN Bobette Mortiz, David Manuel,  MD      . potassium chloride (K-DUR,KLOR-CON) CR tablet 10 mEq  10 mEq Oral BID Kari BaarsHawkins, Edward, MD   10 mEq at 06/28/18 1100  . pregabalin (LYRICA) capsule 75 mg  75 mg Oral QODAY Bobette Mortiz, David Manuel, MD   75 mg at 06/27/18 1015  . traZODone (DESYREL) tablet 50 mg  50 mg Oral QHS PRN Bobette Mortiz, David Manuel, MD      . Vitamin D (Ergocalciferol) (DRISDOL) capsule 50,000 Units  50,000 Units Oral Q Tue Bobette Mortiz, David Manuel, MD   50,000 Units at 06/27/18 1030  . Warfarin - Pharmacist Dosing Inpatient   Does not apply q1800 Kari BaarsHawkins, Edward, MD         Discharge Medications: Please see discharge summary for a list of discharge medications.  Relevant Imaging Results:  Relevant Lab Results:   Additional Information SSN: [id number]  Andrea Col[place]ier, [place]

## 2018-06-28 NOTE — Evaluation (Addendum)
Physical Therapy Evaluation Patient Details Name: [name] MRN: [id number] DOB: 1926/04/25 Today's Date: 06/28/2018   History of Present Illness  [name] is a 84 y.o. female with medical history significant of carotid artery disease, coronary artery disease, LBBB, aortic valve replacement, hyperlipidemia, hypertension, macular degeneration, history of CVA, history of transient global amnesia, unspecified peripheral neuropathy, vitamin D deficiency who is brought to the emergency department after having a fall at home.  Then this afternoon, the patient mentation decrease and she became tachypneic.  EMS was subsequently called.  There has been no fever, chills, sore throat, hemoptysis, chest pain, palpitations, dizziness, diaphoresis, PND or orthopnea.  No abdominal pain, nausea, emesis, diarrhea or constipation.  No dysuria, frequency or hematuria.  No heat or cold intolerance.  No polyuria, polydipsia, polyphagia.  No pruritus.    Clinical Impression  Patient functioning below baseline and at risk for falls due to BLE weakness resulting in poor standing balance, [facility] slow labored movement prior to attempting gait training, tends to lean over RW with occasional veering left/right and limited secondary to c/o fatigue and c/o intermittent right hip pain with occasional buckling of RLE.Marland Kitchen.  Patient tolerated sitting up in chair with family members present after therapy.  Patient will benefit from continued physical therapy in hospital and recommended venue below to increase strength, balance, endurance for safe ADLs and gait.    Follow Up Recommendations SNF;Supervision/Assistance - 24 hour    Equipment Recommendations  None recommended by PT    Recommendations for Other Services       Precautions / Restrictions Precautions Precautions: Fall Restrictions Weight Bearing Restrictions: No      Mobility  Bed Mobility Overal bed mobility: Needs  Assistance Bed Mobility: Supine to Sit     Supine to sit: Min assist     General bed mobility comments: slow labored movement  Transfers Overall transfer level: Needs assistance Equipment used: Rolling walker (2 wheeled) Transfers: Sit to/from [employer];Stand Pivot Transfers Sit to Stand: Min assist Stand pivot transfers: Min assist       General transfer comment: unsteady on feet  Ambulation/Gait Ambulation/Gait assistance: Min assist Gait Distance (Feet): 35 Feet Assistive device: Rolling walker (2 wheeled) Gait Pattern/deviations: Decreased step length - right;Decreased step length - left;Decreased stride length;Drifts right/left Gait velocity: decreased   General Gait Details: slow unsteady cadence with occasional drifting left/right, has diffiuclty making turns due to weakness and limited secondary to c/o fatigue  Stairs            Wheelchair Mobility    Modified Rankin (Stroke Patients Only)       Balance Overall balance assessment: Needs assistance Sitting-balance support: Feet supported;No upper extremity supported Sitting balance-Leahy Scale: Good     Standing balance support: Bilateral upper extremity supported;During functional activity Standing balance-Leahy Scale: Fair                               Pertinent Vitals/Pain Pain Assessment: No/denies pain    Home Living   Living Arrangements: Alone                    Prior Function Level of Independence: Needs assistance   Gait / Transfers Assistance Needed: household ambulator with RW  ADL's / Homemaking Assistance Needed: home aides 3 pm to 6pm x 3 days/week, daughter helps PRN        Hand Dominance  Extremity/Trunk Assessment   Upper Extremity Assessment Upper Extremity Assessment: Generalized weakness    Lower Extremity Assessment Lower Extremity Assessment: Generalized weakness    Cervical / Trunk Assessment Cervical / Trunk Assessment: Kyphotic   Communication   Communication: No difficulties  Cognition Arousal/Alertness: Awake/alert Behavior During Therapy: WFL for tasks assessed/performed Overall Cognitive Status: Within Functional Limits for tasks assessed                                        General Comments      Exercises     Assessment/Plan    PT Assessment Patient needs continued PT services  PT Problem List Decreased strength;Decreased activity tolerance;Decreased balance;Decreased mobility       PT Treatment Interventions Gait training;Stair training;Functional mobility training;Therapeutic activities;Therapeutic exercise;Patient/family education    PT Goals (Current goals can be found in the Care Plan section)  Acute Rehab PT Goals Patient Stated Goal: return home PT Goal Formulation: With patient/family Time For Goal Achievement: 07/12/18 Potential to Achieve Goals: Good    Frequency Min 3X/week   Barriers to discharge        Co-evaluation               AM-PAC PT "6 Clicks" Daily Activity  Outcome Measure Difficulty turning over in bed (including adjusting bedclothes, sheets and blankets)?: None Difficulty moving from lying on back to sitting on the side of the bed? : A Little Difficulty sitting down on and standing up from a chair with arms (e.g., wheelchair, bedside commode, etc,.)?: A Little Help needed moving to and from a bed to chair (including a wheelchair)?: A Little Help needed walking in hospital room?: A Lot Help needed climbing 3-5 steps with a railing? : A Lot 6 Click Score: 17    End of Session   Activity Tolerance: Patient tolerated treatment well;Patient limited by fatigue Patient left: in chair;with call bell/phone within reach;with family/visitor present Nurse Communication: Mobility status PT Visit Diagnosis: Unsteadiness on feet (R26.81);Other abnormalities of gait and mobility (R26.89);Muscle weakness (generalized) (M62.81)    Time: [id number] PT  Time Calculation (min) (ACUTE ONLY): 39 min   Charges:   PT Evaluation $PT Eval Moderate Complexity: 1 Mod PT Treatments $Gait Training: 8-22 mins $Therapeutic Activity: 8-22 mins        2:00 PM, 06/28/18 Ocie BobJames Jamill Wetmore, MPT Physical Therapist with [facility] [phone] office [phone] mobile phone

## 2018-06-28 NOTE — Progress Notes (Signed)
Subjective: She says she feels better.  She still has pain in her right hip.  Her breathing is doing okay.  She is very hard of hearing which makes communication difficult.  Objective: Vital signs in last 24 hours: Temp:  [97.7 F (36.5 C)-98.5 F (36.9 C)] 97.7 F (36.5 C) (08/14 0400) Pulse Rate:  [46-61] 52 (08/14 0600) Resp:  [13-28] 14 (08/14 0600) BP: (126-161)/(46-124) 153/56 (08/14 0600) SpO2:  [98 %-100 %] 99 % (08/14 0600) Weight:  [47.3 kg] 47.3 kg (08/14 0400) Weight change: -0.781 kg    Intake/Output from previous day: 08/13 0701 - 08/14 0700 In: 550 [P.O.:550] Out: 550 [Urine:550]  PHYSICAL EXAM General appearance: alert, cooperative and no distress Resp: clear to auscultation bilaterally Cardio: She has prosthetic heart sounds and systolic murmur GI: soft, non-tender; bowel sounds normal; no masses,  no organomegaly Extremities: She complains of pain in her right hip but does not have any tenderness or really decreased range of motion  Lab Results:  Results for orders placed or performed during the hospital encounter of 06/26/18 (from the past 48 hour(s))  CBC with Differential/Platelet     Status: Abnormal   Collection Time: 06/26/18  7:35 PM  Result Value Ref Range   WBC 8.9 4.0 - 10.5 K/uL   RBC 2.99 (L) 3.87 - 5.11 MIL/uL   Hemoglobin 9.0 (L) 12.0 - 15.0 g/dL   HCT 29.3 (L) 36.0 - 46.0 %   MCV 98.0 78.0 - 100.0 fL   MCH 30.1 26.0 - 34.0 pg   MCHC 30.7 30.0 - 36.0 g/dL   RDW 14.0 11.5 - 15.5 %   Platelets 193 150 - 400 K/uL   Neutrophils Relative % 79 %   Neutro Abs 7.0 1.7 - 7.7 K/uL   Lymphocytes Relative 10 %   Lymphs Abs 0.9 0.7 - 4.0 K/uL   Monocytes Relative 9 %   Monocytes Absolute 0.8 0.1 - 1.0 K/uL   Eosinophils Relative 2 %   Eosinophils Absolute 0.2 0.0 - 0.7 K/uL   Basophils Relative 0 %   Basophils Absolute 0.0 0.0 - 0.1 K/uL    Comment: Performed at [facility], [address], [place], [place] [address]  Comprehensive metabolic  panel     Status: Abnormal   Collection Time: 06/26/18  7:35 PM  Result Value Ref Range   Sodium 143 135 - 145 mmol/L   Potassium 4.3 3.5 - 5.1 mmol/L   Chloride 106 98 - 111 mmol/L   CO2 31 22 - 32 mmol/L   Glucose, Bld 112 (H) 70 - 99 mg/dL   BUN 23 8 - 23 mg/dL   Creatinine, Ser 0.94 0.44 - 1.00 mg/dL   Calcium 9.1 8.9 - 10.3 mg/dL   Total Protein 6.5 6.5 - 8.1 g/dL   Albumin 3.5 3.5 - 5.0 g/dL   AST 36 15 - 41 U/L   ALT 24 0 - 44 U/L   Alkaline Phosphatase 73 38 - 126 U/L   Total Bilirubin 0.7 0.3 - 1.2 mg/dL   GFR calc non Af Amer 52 (L) >60 mL/min   GFR calc Af Amer >60 >60 mL/min    Comment: (NOTE) The eGFR has been calculated using the CKD EPI equation. This calculation has not been validated in all clinical situations. eGFR's persistently <60 mL/min signify possible Chronic Kidney Disease.    Anion gap 6 5 - 15    Comment: Performed at [facility], [address], [place], [place] [address]  Brain  natriuretic peptide     Status: Abnormal   Collection Time: 06/26/18  7:35 PM  Result Value Ref Range   B Natriuretic Peptide 1,329.0 (H) 0.0 - 100.0 pg/mL    Comment: Performed at [facility], [address], [place], [place] [address]  Protime-INR     Status: Abnormal   Collection Time: 06/26/18  7:35 PM  Result Value Ref Range   Prothrombin Time 28.0 (H) 11.4 - 15.2 seconds   INR 2.65     Comment: Performed at [facility], [address], [place], [place] [address]  I-stat troponin, ED     Status: None   Collection Time: 06/26/18  9:36 PM  Result Value Ref Range   Troponin i, poc 0.02 0.00 - 0.08 ng/mL   Comment 3            Comment: Due to the release kinetics of cTnI, a negative result within the first hours of the onset of symptoms does not rule out myocardial infarction with certainty. If myocardial infarction is still suspected, repeat the test at appropriate intervals.   Magnesium     Status: None   Collection Time: 06/27/18 12:18 AM  Result Value Ref  Range   Magnesium 1.9 1.7 - 2.4 mg/dL    Comment: Performed at [facility], [address] [place], [place] [address]  Troponin I     Status: Abnormal   Collection Time: 06/27/18 12:18 AM  Result Value Ref Range   Troponin I 0.06 (HH) <0.03 ng/mL    Comment: CRITICAL RESULT CALLED TO, READ BACK BY AND VERIFIED WITH: NICKOLS,K. AT 0136 ON 06/27/2018 BY EVA Performed at [facility], [address] [place], [place] [address]   MRSA PCR Screening     Status: None   Collection Time: 06/27/18  1:52 AM  Result Value Ref Range   MRSA by PCR NEGATIVE NEGATIVE    Comment:        The GeneXpert MRSA Assay (FDA approved for NASAL specimens only), is one component of a comprehensive MRSA colonization surveillance program. It is not intended to diagnose MRSA infection nor to guide or monitor treatment for MRSA infections. Performed at [facility], [address] [place], [place] [address]   Troponin I     Status: Abnormal   Collection Time: 06/27/18  6:14 AM  Result Value Ref Range   Troponin I 0.06 (HH) <0.03 ng/mL    Comment: CRITICAL VALUE NOTED.  VALUE IS CONSISTENT WITH PREVIOUSLY REPORTED AND CALLED VALUE. Performed at [facility], [address], [place], [place] [address]   Protime-INR     Status: Abnormal   Collection Time: 06/27/18  6:14 AM  Result Value Ref Range   Prothrombin Time 24.4 (H) 11.4 - 15.2 seconds   INR 2.22     Comment: Performed at [facility], [address] [place], [place] [address]  CBC     Status: Abnormal   Collection Time: 06/27/18  6:14 AM  Result Value Ref Range   WBC 7.9 4.0 - 10.5 K/uL   RBC 2.95 (L) 3.87 - 5.11 MIL/uL   Hemoglobin 9.2 (L) 12.0 - 15.0 g/dL   HCT 28.6 (L) 36.0 - 46.0 %   MCV 96.9 78.0 - 100.0 fL   MCH 31.2 26.0 - 34.0 pg   MCHC 32.2 30.0 - 36.0 g/dL   RDW 14.0 11.5 - 15.5 %   Platelets 207 150 - 400 K/uL    Comment: Performed at [facility], [name]  [address] [place], [place] [address]  Troponin I     Status: Abnormal    Collection Time: 06/27/18 12:40 PM  Result Value Ref Range   Troponin I 0.05 (HH) <0.03 ng/mL    Comment: CRITICAL VALUE NOTED.  VALUE IS CONSISTENT WITH PREVIOUSLY REPORTED AND CALLED VALUE. Performed at [facility], [address], [place], [place] [address]   Basic metabolic panel     Status: Abnormal   Collection Time: 06/28/18  4:17 AM  Result Value Ref Range   Sodium 142 135 - 145 mmol/L   Potassium 3.8 3.5 - 5.1 mmol/L   Chloride 101 98 - 111 mmol/L   CO2 32 22 - 32 mmol/L   Glucose, Bld 104 (H) 70 - 99 mg/dL   BUN 36 (H) 8 - 23 mg/dL   Creatinine, Ser 1.11 (H) 0.44 - 1.00 mg/dL   Calcium 8.6 (L) 8.9 - 10.3 mg/dL   GFR calc non Af Amer 43 (L) >60 mL/min   GFR calc Af Amer 50 (L) >60 mL/min    Comment: (NOTE) The eGFR has been calculated using the CKD EPI equation. This calculation has not been validated in all clinical situations. eGFR's persistently <60 mL/min signify possible Chronic Kidney Disease.    Anion gap 9 5 - 15    Comment: Performed at [facility], [address], [place], [place] [address]  Protime-INR     Status: Abnormal   Collection Time: 06/28/18  4:17 AM  Result Value Ref Range   Prothrombin Time 23.7 (H) 11.4 - 15.2 seconds   INR 2.14     Comment: Performed at [facility], [address] [place], [place] [address]  CBC     Status: Abnormal   Collection Time: 06/28/18  4:17 AM  Result Value Ref Range   WBC 11.3 (H) 4.0 - 10.5 K/uL   RBC 2.90 (L) 3.87 - 5.11 MIL/uL   Hemoglobin 8.8 (L) 12.0 - 15.0 g/dL   HCT 28.1 (L) 36.0 - 46.0 %   MCV 96.9 78.0 - 100.0 fL   MCH 30.3 26.0 - 34.0 pg   MCHC 31.3 30.0 - 36.0 g/dL   RDW 14.1 11.5 - 15.5 %   Platelets 206 150 - 400 K/uL    Comment: Performed at [facility], [address], [place], [place] [address]    ABGS No results for input(s): PHART, PO2ART, TCO2, HCO3 in the last 72 hours.  Invalid input(s): PCO2 CULTURES Recent Results (from the past 240 hour(s))  MRSA PCR Screening     Status: None    Collection Time: 06/27/18  1:52 AM  Result Value Ref Range Status   MRSA by PCR NEGATIVE NEGATIVE Final    Comment:        The GeneXpert MRSA Assay (FDA approved for NASAL specimens only), is one component of a comprehensive MRSA colonization surveillance program. It is not intended to diagnose MRSA infection nor to guide or monitor treatment for MRSA infections. Performed at [facility], [address] [place], [place] [address]    Studies/Results: Dg Chest 2 View  Result Date: 06/26/2018 CLINICAL DATA:  Weakness, right hip pain EXAM: CHEST - 2 VIEW COMPARISON:  03/10/2017, CT chest 03/28/2018 FINDINGS: Sternotomy changes. Patient is rotated. Small bilateral pleural effusions. Hazy airspace disease at the bases. Cardiomegaly with vascular congestion and mild diffuse interstitial opacity consistent with edema. Aortic atherosclerosis. No pneumothorax. Old right-sided rib deformities. Emphysematous disease. IMPRESSION: 1. Cardiomegaly with vascular congestion, mild interstitial pulmonary edema, and small bilateral  pleural effusions. 2. Hazy airspace disease at the bases may reflect atelectasis or edema. Electronically Signed   By: Donavan Foil M.D.   On: 06/26/2018 20:11   Ct Hip Right Wo Contrast  Result Date: 06/26/2018 CLINICAL DATA:  Right hip pain and shortness of breath. Abnormal radiographs of right hip. EXAM: CT OF THE RIGHT HIP WITHOUT CONTRAST TECHNIQUE: Multidetector CT imaging of the right hip was performed according to the standard protocol. Multiplanar CT image reconstructions were also generated. COMPARISON:  Right hip radiographs 06/26/2018 FINDINGS: Bones/Joint/Cartilage Degenerative changes in the right hip with osteophytes on the acetabular and femoral surfaces. No evidence of acute fracture or dislocation of the right hip. Plain radiographic finding of cortical step-off likely represented osteophyte formation. Old healed fracture deformities are demonstrated in the right  superior and inferior pubic rami. There is a small right hip joint effusion. Ligaments Suboptimally assessed by CT. Muscles and Tendons Mild muscle atrophy. No intramuscular mass or hematoma suggested. Soft tissues The bladder is distended, possibly physiologic or possibly due to urinary retention. Vascular calcifications are present. There is a small right inguinal hernia containing small bowel without apparent proximal obstruction. IMPRESSION: No acute fracture or dislocation of the right hip. Degenerative changes in the right hip. Small right hip joint effusion. Small right inguinal hernia containing small bowel without apparent proximal obstruction. Electronically Signed   By: Lucienne Capers M.D.   On: 06/26/2018 23:56   Dg Hip Unilat With Pelvis 2-3 Views Right  Result Date: 06/26/2018 CLINICAL DATA:  Right hip and groin pain after a fall EXAM: DG HIP (WITH OR WITHOUT PELVIS) 2-3V RIGHT COMPARISON:  None. FINDINGS: Limited evaluation of the right femoral neck due to superimposition of trochanter. Possible step-off deformity at the right femoral head neck junction. Old appearing fracture deformity of the right superior and inferior pubic rami. IMPRESSION: 1. Limited evaluation of femoral neck due to positioning, questionable step-off deformity at the right femoral head neck junction, suggest CT to better evaluate. 2. Old appearing fractures of the right superior and inferior pubic rami. Electronically Signed   By: Donavan Foil M.D.   On: 06/26/2018 20:13    Medications:  Prior to Admission:  Medications Prior to Admission  Medication Sig Dispense Refill Last Dose  . acetaminophen (TYLENOL 8 HOUR) 650 MG CR tablet Take 650 mg by mouth 2 (two) times daily.   06/26/2018 at Unknown time  . ALPRAZolam (XANAX) 0.25 MG tablet Take 0.25 mg by mouth at bedtime.    06/25/2018 at Unknown time  . aspirin 81 MG EC tablet Take 81 mg by mouth every evening.    06/25/2018 at Unknown time  . atorvastatin (LIPITOR)  40 MG tablet Take 1 tablet (40 mg total) by mouth daily. (Patient taking differently: Take 40 mg by mouth every morning. ) 90 tablet 3 06/26/2018 at Unknown time  . baclofen (LIORESAL) 10 MG tablet Take 10 mg by mouth 2 (two) times daily.   06/26/2018 at Unknown time  . benazepril (LOTENSIN) 40 MG tablet TAKE (1) TABLET BY MOUTH ONCE DAILY. (Patient taking differently: Take 40 mg by mouth every morning. ) 90 tablet 1 06/26/2018 at Unknown time  . carvedilol (COREG) 6.25 MG tablet Take 6.25 mg by mouth 2 (two) times daily with a meal.   06/26/2018 at 9-930a  . donepezil (ARICEPT) 10 MG tablet TAKE ONE TABLET BY MOUTH ONCE DAILY. (Patient taking differently: Take 10 mg by mouth at bedtime. ) 30 tablet 0 06/25/2018 at Unknown time  .  ergocalciferol (VITAMIN D2) 50000 UNITS capsule Take 50,000 Units by mouth every Tuesday.    Past Week at Unknown time  . ferrous sulfate 325 (65 FE) MG EC tablet Take 325 mg by mouth as needed.    Past Month at Unknown time  . furosemide (LASIX) 20 MG tablet TAKE (1) TABLET BY MOUTH ONCE DAILY. (Patient taking differently: Take 20 mg by mouth every morning. ) 30 tablet 5 06/26/2018 at Unknown time  . LYRICA 75 MG capsule TAKE ONE CAPSULE BY MOUTH ONCE DAILY. (Patient taking differently: Take 75 mg by mouth every other day. AT BEDTIME) 30 capsule 0 06/25/2018 at Unknown time  . meclizine (ANTIVERT) 25 MG tablet Take 25 mg by mouth 3 (three) times daily as needed for dizziness.   UNKNOWN  . Melatonin 3 MG TABS Take 3 mg by mouth at bedtime.     06/25/2018 at Unknown time  . Multiple Vitamins-Minerals (VITEYES AREDS FORMULA PO) Take 1 tablet by mouth daily.   Past Month at Unknown time  . potassium chloride (K-DUR) 10 MEQ tablet TAKE ONE TABLET BY MOUTH ONCE DAILY. (Patient taking differently: Take 10 mEq by mouth every morning. ) 30 tablet 6 06/26/2018 at Unknown time  . traMADol (ULTRAM) 50 MG tablet Take 50 mg by mouth every 12 (twelve) hours as needed for moderate pain or severe  pain.    UNKNOWN  . traZODone (DESYREL) 50 MG tablet Take 50 mg by mouth at bedtime as needed for sleep.    UNKNOWN  . umeclidinium-vilanterol (ANORO ELLIPTA) 62.5-25 MCG/INH AEPB Inhale 1 puff into the lungs every morning.   06/26/2018 at Unknown time  . warfarin (COUMADIN) 5 MG tablet TAKE 1/2 TO 1 TABLET DAILY AS DIRECTED BY COUMADIN CLINIC. (Patient taking differently: Take 2.5-5 mg by mouth See admin instructions. 2.5MG ON ALL DAYS EXCEPT 5MG ON SATURDAYS ONLY. TAKES AT SUPPER) 30 tablet 3 06/25/2018 at 1700   Scheduled: . ALPRAZolam  0.25 mg Oral QHS  . aspirin EC  81 mg Oral QPM  . atorvastatin  40 mg Oral q morning - 10a  . baclofen  10 mg Oral BID  . benazepril  40 mg Oral q morning - 10a  . carvedilol  6.25 mg Oral BID WC  . donepezil  10 mg Oral QHS  . levalbuterol  0.63 mg Nebulization Q8H  . potassium chloride  10 mEq Oral BID  . pregabalin  75 mg Oral QODAY  . Vitamin D (Ergocalciferol)  50,000 Units Oral Q Tue  . Warfarin - Pharmacist Dosing Inpatient   Does not apply q1800   Continuous:  Anti-infectives (From admission, onward)   None      Assesment: She has been admitted with acute diastolic heart failure.  She has valvular heart disease and has history of coronary artery disease with bypass grafting.  She has had aortic valve replacement.  She is been having more problems with her valve and more problems with heart failure in the last 6 months or so.  She has some element of COPD and I am going to have her start on neb treatments.  Weight shows increase and she is only -50 so I am going to increase her Lasix at least this morning.  She has chronic anemia which is unchanged  She has hypertension and her blood pressure is still up a little bit and I am going to see what happens with increased dose of Lasix.  We do not have much room to move on  her carvedilol because of bradycardia during the night and she is on maximum dose of her ACE inhibitor  She has dementia at  baseline and she is had more confusion since she is been in the hospital  She has hip pain which was present before she fell and then she fell at home. Principal Problem:   Acute diastolic heart failure (HCC) Active Problems:   Hyperlipidemia   Essential hypertension   CAD, ARTERY BYPASS GRAFT   S/P AVR (aortic valve replacement)   Dementia   Anemia   CHF (congestive heart failure) ([place])    Plan: Transfer out of stepdown.  Increase Lasix this morning.  PT consultation.  Cardiology consultation noted and appreciated    LOS: 0 days   , L 06/28/2018, 8:26 AM

## 2018-06-28 NOTE — Progress Notes (Addendum)
ANTICOAGULATION CONSULT NOTE - Initial Consult  Pharmacy Consult for Coumadin Indication: mechanical valve  Allergies  Allergen Reactions  . Codeine   . Penicillins     Patient Measurements: Height: 5\' 6"  (167.6 cm) Weight: 104 lb 4.4 oz (47.3 kg) IBW/kg (Calculated) : 59.3  Vital Signs: Temp: 98.1 F (36.7 C) (08/14 0855) Temp Source: Oral (08/14 0855) BP: 136/51 (08/14 0900) Pulse Rate: 70 (08/14 1100)  Labs: Recent Labs    06/26/18 1935 06/27/18 0018 06/27/18 0614 06/27/18 1240 06/28/18 0417  HGB 9.0*  --  9.2*  --  8.8*  HCT 29.3*  --  28.6*  --  28.1*  PLT 193  --  207  --  206  LABPROT 28.0*  --  24.4*  --  23.7*  INR 2.65  --  2.22  --  2.14  CREATININE 0.94  --   --   --  1.11*  TROPONINI  --  0.06* 0.06* 0.05*  --     Estimated Creatinine Clearance: 25.7 mL/min (A) (by C-G formula based on SCr of 1.11 mg/dL (H)).   Medical History: Past Medical History:  Diagnosis Date  . Carotid artery stenosis    Without infarction  . Coronary artery disease   . Heart valve replaced by other means   . Hypercholesterolemia    Pure  . Hypertension    Unspecified  . LBBB (left bundle branch block)   . Macular degeneration (senile) of retina, unspecified   . Postsurgical aortocoronary bypass status   . Stroke (HCC)   . Transient global amnesia   . Unspecified hereditary and idiopathic peripheral neuropathy   . Unspecified vitamin D deficiency     Medications:  Medications Prior to Admission  Medication Sig Dispense Refill Last Dose  . acetaminophen (TYLENOL 8 HOUR) 650 MG CR tablet Take 650 mg by mouth 2 (two) times daily.   06/26/2018 at Unknown time  . ALPRAZolam (XANAX) 0.25 MG tablet Take 0.25 mg by mouth at bedtime.    06/25/2018 at Unknown time  . aspirin 81 MG EC tablet Take 81 mg by mouth every evening.    06/25/2018 at Unknown time  . atorvastatin (LIPITOR) 40 MG tablet Take 1 tablet (40 mg total) by mouth daily. (Patient taking differently: Take 40 mg  by mouth every morning. ) 90 tablet 3 06/26/2018 at Unknown time  . baclofen (LIORESAL) 10 MG tablet Take 10 mg by mouth 2 (two) times daily.   06/26/2018 at Unknown time  . benazepril (LOTENSIN) 40 MG tablet TAKE (1) TABLET BY MOUTH ONCE DAILY. (Patient taking differently: Take 40 mg by mouth every morning. ) 90 tablet 1 06/26/2018 at Unknown time  . carvedilol (COREG) 6.25 MG tablet Take 6.25 mg by mouth 2 (two) times daily with a meal.   06/26/2018 at 9-930a  . donepezil (ARICEPT) 10 MG tablet TAKE ONE TABLET BY MOUTH ONCE DAILY. (Patient taking differently: Take 10 mg by mouth at bedtime. ) 30 tablet 0 06/25/2018 at Unknown time  . ergocalciferol (VITAMIN D2) 50000 UNITS capsule Take 50,000 Units by mouth every Tuesday.    Past Week at Unknown time  . ferrous sulfate 325 (65 FE) MG EC tablet Take 325 mg by mouth as needed.    Past Month at Unknown time  . furosemide (LASIX) 20 MG tablet TAKE (1) TABLET BY MOUTH ONCE DAILY. (Patient taking differently: Take 20 mg by mouth every morning. ) 30 tablet 5 06/26/2018 at Unknown time  . LYRICA 75 MG  capsule TAKE ONE CAPSULE BY MOUTH ONCE DAILY. (Patient taking differently: Take 75 mg by mouth every other day. AT BEDTIME) 30 capsule 0 06/25/2018 at Unknown time  . meclizine (ANTIVERT) 25 MG tablet Take 25 mg by mouth 3 (three) times daily as needed for dizziness.   UNKNOWN  . Melatonin 3 MG TABS Take 3 mg by mouth at bedtime.     06/25/2018 at Unknown time  . Multiple Vitamins-Minerals (VITEYES AREDS FORMULA PO) Take 1 tablet by mouth daily.   Past Month at Unknown time  . potassium chloride (K-DUR) 10 MEQ tablet TAKE ONE TABLET BY MOUTH ONCE DAILY. (Patient taking differently: Take 10 mEq by mouth every morning. ) 30 tablet 6 06/26/2018 at Unknown time  . traMADol (ULTRAM) 50 MG tablet Take 50 mg by mouth every 12 (twelve) hours as needed for moderate pain or severe pain.    UNKNOWN  . traZODone (DESYREL) 50 MG tablet Take 50 mg by mouth at bedtime as needed for  sleep.    UNKNOWN  . umeclidinium-vilanterol (ANORO ELLIPTA) 62.5-25 MCG/INH AEPB Inhale 1 puff into the lungs every morning.   06/26/2018 at Unknown time  . warfarin (COUMADIN) 5 MG tablet TAKE 1/2 TO 1 TABLET DAILY AS DIRECTED BY COUMADIN CLINIC. (Patient taking differently: Take 2.5-5 mg by mouth See admin instructions. 2.5MG  ON ALL DAYS EXCEPT 5MG  ON SATURDAYS ONLY. TAKES AT SUPPER) 30 tablet 3 06/25/2018 at 1700    Assessment: 84 year old woman with a history of coronary artery disease and CABG as well as aortic valve replacement. INR at 2.14, just below goal. Home dose is 2.5mg  every day except , 5mg  on Saturday  Goal of Therapy:  INR goal 2.2-3.0 Monitor platelets by anticoagulation protocol: Yes   Plan:  Coumadin 3mg  x 1 dose today PT-INR daily Monitor for S/S for bleeding  Elder CyphersLorie Kalle Bernath, BS Loura BackPharm D, BCPS Clinical Pharmacist Pager [phone] 06/28/2018,12:04 PM

## 2018-06-28 NOTE — Plan of Care (Signed)
  Problem: Acute Rehab PT Goals(only PT should resolve) Goal: Pt Will Go Supine/Side To Sit Outcome: Progressing Flowsheets (Taken 06/28/2018 1403) Pt will go Supine/Side to Sit: with supervision Goal: Patient Will Transfer Sit To/From Stand Outcome: Progressing Flowsheets (Taken 06/28/2018 1403) Patient will transfer sit to/from stand: with min guard assist Goal: Pt Will Transfer Bed To Chair/Chair To Bed Outcome: Progressing Flowsheets (Taken 06/28/2018 1403) Pt will Transfer Bed to Chair/Chair to Bed: min guard assist Goal: Pt Will Ambulate Outcome: Progressing Flowsheets (Taken 06/28/2018 1403) Pt will Ambulate: 75 feet; with min guard assist; with rolling walker   2:03 PM, 06/28/18 Ocie BobJames Jashawn Floyd, MPT Physical Therapist [facility] [phone] office [phone] mobile phone

## 2018-06-28 NOTE — Progress Notes (Signed)
Patient arrived to the unit. Daughter at bedside. Remains alert and oriented. Shift assessment completed with Morrie SheldonAshley, RN. All questions/concerns answered. Daughter raised the 4th siderail up for patient safety. RN explained to patient's daughter that staff can only raise 3 of the siderails up, per hospital policy.

## 2018-06-28 NOTE — Care Management Note (Signed)
Case Management Note  Patient Details  Name: Josephine CablesRuth E [name] MRN: [id number] Date of Birth: 1926-07-17  Subjective/Objective:     CHF.  CM consulted for CHF protocol.       Action/Plan: Recommended for SNF. CSW working with patient / family to arrange.   Expected Discharge Date:    06/30/2018              Expected Discharge Plan:  Skilled Nursing Facility  In-House Referral:  Clinical Social Work  Discharge planning Services  CM Consult  Post Acute Care Choice:  NA Choice offered to:  NA  DME Arranged:    DME Agency:     HH Arranged:    HH Agency:     Status of Service:  Completed, signed off  If discussed at [employer] Meetings, dates discussed:    Additional Comments:  Adore Kithcart, Chrystine OilerSharley Diane, RN 06/28/2018, 12:51 PM

## 2018-06-28 NOTE — Progress Notes (Signed)
Progress Note  Patient Name: [name] Date of Encounter: 06/28/2018  Primary Cardiologist: Dr. Jens Somrenshaw  Subjective   Says she is doing well. Daughter present. We had a lengthy discussion regarding her cardiac condition.  Inpatient Medications    Scheduled Meds: . ALPRAZolam  0.25 mg Oral QHS  . aspirin EC  81 mg Oral QPM  . atorvastatin  40 mg Oral q morning - 10a  . baclofen  10 mg Oral BID  . benazepril  40 mg Oral q morning - 10a  . carvedilol  6.25 mg Oral BID WC  . donepezil  10 mg Oral QHS  . levalbuterol  0.63 mg Nebulization Q8H  . potassium chloride  10 mEq Oral BID  . pregabalin  75 mg Oral QODAY  . Vitamin D (Ergocalciferol)  50,000 Units Oral Q Tue  . Warfarin - Pharmacist Dosing Inpatient   Does not apply q1800   Continuous Infusions:  PRN Meds: acetaminophen **OR** acetaminophen, ondansetron **OR** ondansetron (ZOFRAN) IV, traZODone   Vital Signs    Vitals:   06/28/18 0500 06/28/18 0600 06/28/18 0843 06/28/18 0855  BP: (!) 161/51 (!) 153/56    Pulse: (!) 52 (!) 52    Resp: 13 14    Temp:    98.1 F (36.7 C)  TempSrc:    Oral  SpO2: 100% 99% 96%   Weight:      Height:        Intake/Output Summary (Last 24 hours) at 06/28/2018 0857 Last data filed at 06/27/2018 1808 Gross per 24 hour  Intake 450 ml  Output 550 ml  Net -100 ml   Filed Weights   06/27/18 0202 06/27/18 0435 06/28/18 0400  Weight: 46.1 kg 46 kg 47.3 kg    Telemetry    Sinus rhythm, sinus brady, PAC's, PVC's - Personally Reviewed  ECG    NA - Personally Reviewed  Physical Exam   GEN: No acute distress.   Neck: No JVD Cardiac: RRR, no murmurs, rubs, or gallops.  Respiratory: Clear to auscultation bilaterally. GI: Soft, nontender, non-distended  MS: No edema; No deformity. Neuro:  Nonfocal  Psych: Normal affect   Labs    Chemistry Recent Labs  Lab 06/26/18 1935 06/28/18 0417  NA 143 142  K 4.3 3.8  CL 106 101  CO2 31 32  GLUCOSE 112* 104*  BUN 23  36*  CREATININE 0.94 1.11*  CALCIUM 9.1 8.6*  PROT 6.5  --   ALBUMIN 3.5  --   AST 36  --   ALT 24  --   ALKPHOS 73  --   BILITOT 0.7  --   GFRNONAA 52* 43*  GFRAA >60 50*  ANIONGAP 6 9     Hematology Recent Labs  Lab 06/26/18 1935 06/27/18 0614 06/28/18 0417  WBC 8.9 7.9 11.3*  RBC 2.99* 2.95* 2.90*  HGB 9.0* 9.2* 8.8*  HCT 29.3* 28.6* 28.1*  MCV 98.0 96.9 96.9  MCH 30.1 31.2 30.3  MCHC 30.7 32.2 31.3  RDW 14.0 14.0 14.1  PLT 193 207 206    Cardiac Enzymes Recent Labs  Lab 06/27/18 0018 06/27/18 0614 06/27/18 1240  TROPONINI 0.06* 0.06* 0.05*    Recent Labs  Lab 06/26/18 2136  TROPIPOC 0.02     BNP Recent Labs  Lab 06/26/18 1935  BNP 1,329.0*     DDimer No results for input(s): DDIMER in the last 168 hours.   Radiology    Dg Chest 2 View  Result Date: 06/26/2018  CLINICAL DATA:  Weakness, right hip pain EXAM: CHEST - 2 VIEW COMPARISON:  03/10/2017, CT chest 03/28/2018 FINDINGS: Sternotomy changes. Patient is rotated. Small bilateral pleural effusions. Hazy airspace disease at the bases. Cardiomegaly with vascular congestion and mild diffuse interstitial opacity consistent with edema. Aortic atherosclerosis. No pneumothorax. Old right-sided rib deformities. Emphysematous disease. IMPRESSION: 1. Cardiomegaly with vascular congestion, mild interstitial pulmonary edema, and small bilateral pleural effusions. 2. Hazy airspace disease at the bases may reflect atelectasis or edema. Electronically Signed   By: Jasmine PangKim  Fujinaga M.D.   On: 06/26/2018 20:11   Ct Hip Right Wo Contrast  Result Date: 06/26/2018 CLINICAL DATA:  Right hip pain and shortness of breath. Abnormal radiographs of right hip. EXAM: CT OF THE RIGHT HIP WITHOUT CONTRAST TECHNIQUE: Multidetector CT imaging of the right hip was performed according to the standard protocol. Multiplanar CT image reconstructions were also generated. COMPARISON:  Right hip radiographs 06/26/2018 FINDINGS:  Bones/Joint/Cartilage Degenerative changes in the right hip with osteophytes on the acetabular and femoral surfaces. No evidence of acute fracture or dislocation of the right hip. Plain radiographic finding of cortical step-off likely represented osteophyte formation. Old healed fracture deformities are demonstrated in the right superior and inferior pubic rami. There is a small right hip joint effusion. Ligaments Suboptimally assessed by CT. Muscles and Tendons Mild muscle atrophy. No intramuscular mass or hematoma suggested. Soft tissues The bladder is distended, possibly physiologic or possibly due to urinary retention. Vascular calcifications are present. There is a small right inguinal hernia containing small bowel without apparent proximal obstruction. IMPRESSION: No acute fracture or dislocation of the right hip. Degenerative changes in the right hip. Small right hip joint effusion. Small right inguinal hernia containing small bowel without apparent proximal obstruction. Electronically Signed   By: Burman NievesWilliam  Stevens M.D.   On: 06/26/2018 23:56   Dg Hip Unilat With Pelvis 2-3 Views Right  Result Date: 06/26/2018 CLINICAL DATA:  Right hip and groin pain after a fall EXAM: DG HIP (WITH OR WITHOUT PELVIS) 2-3V RIGHT COMPARISON:  None. FINDINGS: Limited evaluation of the right femoral neck due to superimposition of trochanter. Possible step-off deformity at the right femoral head neck junction. Old appearing fracture deformity of the right superior and inferior pubic rami. IMPRESSION: 1. Limited evaluation of femoral neck due to positioning, questionable step-off deformity at the right femoral head neck junction, suggest CT to better evaluate. 2. Old appearing fractures of the right superior and inferior pubic rami. Electronically Signed   By: Jasmine PangKim  Fujinaga M.D.   On: 06/26/2018 20:13    Cardiac Studies   None this admission  Patient Profile     84 y.o. female with a hx of coronary artery disease and  valvular heart disease who is being seen today for the evaluation of acute diastolic heart failure at the request of Dr. Juanetta GoslingHawkins.  Assessment & Plan    1.  Acute diastolic heart failure: She is symptomatically improved.  Uncertain about weight accuracy as it is up 1.3 kg from yesterday.  Dr. Juanetta GoslingHawkins administering 40 mg IV Lasix this morning. BUN/SCr up to 36/1.11. This would indicate she is nearly euvolemic. Blood pressure remains elevated which will increase LVEDP serving to cause fluid retention. I will give low dose IV hydralzine 10 mg once and see how she responds.  2.  Coronary disease with CABG: Cardiac catheterization from 2011 reviewed above demonstrating occlusion of all grafts.  Continue medical therapy with aspirin, atorvastatin, benazepril, and carvedilol.  3.  Hypertension: Blood  pressure remains elevated which will increase LVEDP serving to cause fluid retention. I will give low dose IV hydralzine 10 mg once and see how she responds.  4.  Hyperlipidemia: Continue atorvastatin.  5.  Status post AVR: Normal bioprosthetic function by echocardiogram in March 2019.  No need to repeat.  6.  Bilateral carotid artery disease: 1 to 39% bilateral internal carotid artery stenosis by Dopplers on 06/21/2018.  Follow-up study planned for 1 year.   For questions or updates, please contact [facility] Please consult [email or web address] for contact info under Cardiology/STEMI.      Signed, Prentice Docker, MD  06/28/2018, 8:57 AM

## 2018-06-28 NOTE — Clinical Social Work Note (Signed)
Clinical Social Work Assessment  Patient Details  Name: [name] MRN: [id number] Date of Birth: 1926-08-06  Date of referral:  06/28/18               Reason for consult:  Facility Placement, Discharge Planning                Permission sought to share information with:  Chartered certified accountant granted to share information::  Yes, Verbal Permission Granted  Name::        Agency::  [facility]  Relationship::     Contact Information:     Housing/Transportation Living arrangements for the past 2 months:  Single Family Home Source of Information:  Patient, Adult Children Patient Interpreter Needed:  None Criminal Activity/Legal Involvement Pertinent to Current Situation/Hospitalization:  No - Comment as needed Significant Relationships:  Adult Children Lives with:  Self Do you feel safe going back to the place where you live?  Yes Need for family participation in patient care:  Yes (Comment)  Care giving concerns: PT recommending SNF rehab.   Social Worker assessment / plan: Pt is an 84 year old female referred to CSW by PT for SNF short term rehab. Met with pt and her daughter this AM. Pt is very hard of hearing. She lives her own home here in [place]. Pt has a caregiver from 3-6pm every day. Pt's daughter states she is working on arranging for more hours of care for pt. Pt and daughter are agreeable to SNF rehab. They request [facility] to [facility]. Will start referral and PASSR screen. Pt has Medicare and tonight will be her first inpatient overnight. Updated MD. Will follow for support and dc planning as needed.  Employment status:  Retired Forensic scientist:  Medicare PT Recommendations:  [place] / Referral to community resources:  [place]  Patient/Family's Response to care: Pt and family accepting of care.  Patient/Family's Understanding of and Emotional Response to Diagnosis, Current Treatment, and Prognosis: Pt  appears to have understanding of diagnosis and treatment recommendations. Family does as well. No emotional distress identified.  Emotional Assessment Appearance:  Appears stated age Attitude/Demeanor/Rapport:  Engaged Affect (typically observed):  Pleasant Orientation:  Oriented to Self, Oriented to Place Alcohol / Substance use:  Not Applicable Psych involvement (Current and /or in the community):  No (Comment)  Discharge Needs  Concerns to be addressed:  Discharge Planning Concerns Readmission within the last 30 days:  No Current discharge risk:  Physical Impairment Barriers to Discharge:  Continued Medical Work up   [employer] Dennison, LCSW 06/28/2018, 11:23 AM

## 2018-06-29 LAB — BASIC METABOLIC PANEL
ANION GAP: 8 (ref 5–15)
BUN: 35 mg/dL — ABNORMAL HIGH (ref 8–23)
CHLORIDE: 103 mmol/L (ref 98–111)
CO2: 33 mmol/L — AB (ref 22–32)
Calcium: 8.8 mg/dL — ABNORMAL LOW (ref 8.9–10.3)
Creatinine, Ser: 1.04 mg/dL — ABNORMAL HIGH (ref 0.44–1.00)
GFR calc non Af Amer: 46 mL/min — ABNORMAL LOW (ref >60)
GFR, EST AFRICAN AMERICAN: 54 mL/min — AB (ref >60)
Glucose, Bld: 90 mg/dL (ref 70–99)
Potassium: 3.9 mmol/L (ref 3.5–5.1)
Sodium: 144 mmol/L (ref 135–145)

## 2018-06-29 LAB — PROTIME-INR
INR: 1.97
Prothrombin Time: 22.2 seconds — ABNORMAL HIGH (ref 11.4–15.2)

## 2018-06-29 MED ORDER — HYDRALAZINE HCL 10 MG PO TABS
10.0000 mg | ORAL_TABLET | Freq: Two times a day (BID) | ORAL | Status: DC
  Administered 2018-06-29: 10 mg via ORAL
  Filled 2018-06-29 (×3): qty 1

## 2018-06-29 MED ORDER — WARFARIN SODIUM 5 MG PO TABS
5.0000 mg | ORAL_TABLET | Freq: Once | ORAL | Status: AC
  Administered 2018-06-29: 5 mg via ORAL
  Filled 2018-06-29: qty 1

## 2018-06-29 MED ORDER — FUROSEMIDE 40 MG PO TABS
40.0000 mg | ORAL_TABLET | Freq: Every day | ORAL | Status: DC
  Administered 2018-06-29 – 2018-07-01 (×3): 40 mg via ORAL
  Filled 2018-06-29 (×3): qty 1

## 2018-06-29 NOTE — Progress Notes (Signed)
Subjective: She feels okay.  She is still short of breath.  Her weight did not change yesterday.  Her weight at home has been more like 98 pounds and she calculates out to about 103 now.  She is not having any chest pain.  No other new complaints.  Objective: Vital signs in last 24 hours: Temp:  [97.8 F (36.6 C)-98.3 F (36.8 C)] 97.8 F (36.6 C) (08/15 0607) Pulse Rate:  [61-77] 77 (08/15 0607) Resp:  [18-26] 18 (08/15 0607) BP: (138-145)/(51-69) 145/58 (08/15 0607) SpO2:  [97 %-100 %] 97 % (08/15 0831) Weight:  [47.3 kg] 47.3 kg (08/15 0609) Weight change: 0 kg Last BM Date: 06/23/18  Intake/Output from previous day: 08/14 0701 - 08/15 0700 In: 360 [P.O.:360] Out: 1150 [Urine:1150]  PHYSICAL EXAM General appearance: alert, cooperative and mild distress Resp: rhonchi bilaterally Cardio: She has abnormal heart sounds. GI: soft, non-tender; bowel sounds normal; no masses,  no organomegaly Extremities: extremities normal, atraumatic, no cyanosis or edema  Lab Results:  Results for orders placed or performed during the hospital encounter of 06/26/18 (from the past 48 hour(s))  Troponin I     Status: Abnormal   Collection Time: 06/27/18 12:40 PM  Result Value Ref Range   Troponin I 0.05 (HH) <0.03 ng/mL    Comment: CRITICAL VALUE NOTED.  VALUE IS CONSISTENT WITH PREVIOUSLY REPORTED AND CALLED VALUE. Performed at [facility], [address], [place], [place] [address]   Basic metabolic panel     Status: Abnormal   Collection Time: 06/28/18  4:17 AM  Result Value Ref Range   Sodium 142 135 - 145 mmol/L   Potassium 3.8 3.5 - 5.1 mmol/L   Chloride 101 98 - 111 mmol/L   CO2 32 22 - 32 mmol/L   Glucose, Bld 104 (H) 70 - 99 mg/dL   BUN 36 (H) 8 - 23 mg/dL   Creatinine, Ser 1.11 (H) 0.44 - 1.00 mg/dL   Calcium 8.6 (L) 8.9 - 10.3 mg/dL   GFR calc non Af Amer 43 (L) >60 mL/min   GFR calc Af Amer 50 (L) >60 mL/min    Comment: (NOTE) The eGFR has been calculated using the CKD  EPI equation. This calculation has not been validated in all clinical situations. eGFR's persistently <60 mL/min signify possible Chronic Kidney Disease.    Anion gap 9 5 - 15    Comment: Performed at [facility], [address], [place], [place] [address]  Protime-INR     Status: Abnormal   Collection Time: 06/28/18  4:17 AM  Result Value Ref Range   Prothrombin Time 23.7 (H) 11.4 - 15.2 seconds   INR 2.14     Comment: Performed at [facility], [address] [place], [place] [address]  CBC     Status: Abnormal   Collection Time: 06/28/18  4:17 AM  Result Value Ref Range   WBC 11.3 (H) 4.0 - 10.5 K/uL   RBC 2.90 (L) 3.87 - 5.11 MIL/uL   Hemoglobin 8.8 (L) 12.0 - 15.0 g/dL   HCT 28.1 (L) 36.0 - 46.0 %   MCV 96.9 78.0 - 100.0 fL   MCH 30.3 26.0 - 34.0 pg   MCHC 31.3 30.0 - 36.0 g/dL   RDW 14.1 11.5 - 15.5 %   Platelets 206 150 - 400 K/uL    Comment: Performed at [facility], [address] [place], [place] [address]  Protime-INR     Status: Abnormal   Collection Time: 06/29/18  5:51  AM  Result Value Ref Range   Prothrombin Time 22.2 (H) 11.4 - 15.2 seconds   INR 1.97     Comment: Performed at [facility], [address], [place], [place] [address]  Basic metabolic panel     Status: Abnormal   Collection Time: 06/29/18  5:51 AM  Result Value Ref Range   Sodium 144 135 - 145 mmol/L   Potassium 3.9 3.5 - 5.1 mmol/L   Chloride 103 98 - 111 mmol/L   CO2 33 (H) 22 - 32 mmol/L   Glucose, Bld 90 70 - 99 mg/dL   BUN 35 (H) 8 - 23 mg/dL   Creatinine, Ser 1.04 (H) 0.44 - 1.00 mg/dL   Calcium 8.8 (L) 8.9 - 10.3 mg/dL   GFR calc non Af Amer 46 (L) >60 mL/min   GFR calc Af Amer 54 (L) >60 mL/min    Comment: (NOTE) The eGFR has been calculated using the CKD EPI equation. This calculation has not been validated in all clinical situations. eGFR's persistently <60 mL/min signify possible Chronic Kidney Disease.    Anion gap 8 5 - 15    Comment: Performed at [facility], [address] [place], [place] [address]    ABGS No results for input(s): PHART, PO2ART, TCO2, HCO3 in the last 72 hours.  Invalid input(s): PCO2 CULTURES Recent Results (from the past 240 hour(s))  MRSA PCR Screening     Status: None   Collection Time: 06/27/18  1:52 AM  Result Value Ref Range Status   MRSA by PCR NEGATIVE NEGATIVE Final    Comment:        The GeneXpert MRSA Assay (FDA approved for NASAL specimens only), is one component of a comprehensive MRSA colonization surveillance program. It is not intended to diagnose MRSA infection nor to guide or monitor treatment for MRSA infections. Performed at [facility], [address] [place],  [address]    Studies/Results: No results found.  Medications:  Prior to Admission:  Medications Prior to Admission  Medication Sig Dispense Refill Last Dose  . acetaminophen (TYLENOL 8 HOUR) 650 MG CR tablet Take 650 mg by mouth 2 (two) times daily.   06/26/2018 at Unknown time  . ALPRAZolam (XANAX) 0.25 MG tablet Take 0.25 mg by mouth at bedtime.    06/25/2018 at Unknown time  . aspirin 81 MG EC tablet Take 81 mg by mouth every evening.    06/25/2018 at Unknown time  . atorvastatin (LIPITOR) 40 MG tablet Take 1 tablet (40 mg total) by mouth daily. (Patient taking differently: Take 40 mg by mouth every morning. ) 90 tablet 3 06/26/2018 at Unknown time  . baclofen (LIORESAL) 10 MG tablet Take 10 mg by mouth 2 (two) times daily.   06/26/2018 at Unknown time  . benazepril (LOTENSIN) 40 MG tablet TAKE (1) TABLET BY MOUTH ONCE DAILY. (Patient taking differently: Take 40 mg by mouth every morning. ) 90 tablet 1 06/26/2018 at Unknown time  . carvedilol (COREG) 6.25 MG tablet Take 6.25 mg by mouth 2 (two) times daily with a meal.   06/26/2018 at 9-930a  . donepezil (ARICEPT) 10 MG tablet TAKE ONE TABLET BY MOUTH ONCE DAILY. (Patient taking differently: Take 10 mg by mouth at bedtime. ) 30 tablet 0 06/25/2018 at Unknown time  . ergocalciferol (VITAMIN  D2) 50000 UNITS capsule Take 50,000 Units by mouth every Tuesday.    Past Week at Unknown time  . ferrous sulfate 325 (65 FE) MG EC tablet Take  325 mg by mouth as needed.    Past Month at Unknown time  . furosemide (LASIX) 20 MG tablet TAKE (1) TABLET BY MOUTH ONCE DAILY. (Patient taking differently: Take 20 mg by mouth every morning. ) 30 tablet 5 06/26/2018 at Unknown time  . LYRICA 75 MG capsule TAKE ONE CAPSULE BY MOUTH ONCE DAILY. (Patient taking differently: Take 75 mg by mouth every other day. AT BEDTIME) 30 capsule 0 06/25/2018 at Unknown time  . meclizine (ANTIVERT) 25 MG tablet Take 25 mg by mouth 3 (three) times daily as needed for dizziness.   UNKNOWN  . Melatonin 3 MG TABS Take 3 mg by mouth at bedtime.     06/25/2018 at Unknown time  . Multiple Vitamins-Minerals (VITEYES AREDS FORMULA PO) Take 1 tablet by mouth daily.   Past Month at Unknown time  . potassium chloride (K-DUR) 10 MEQ tablet TAKE ONE TABLET BY MOUTH ONCE DAILY. (Patient taking differently: Take 10 mEq by mouth every morning. ) 30 tablet 6 06/26/2018 at Unknown time  . traMADol (ULTRAM) 50 MG tablet Take 50 mg by mouth every 12 (twelve) hours as needed for moderate pain or severe pain.    UNKNOWN  . traZODone (DESYREL) 50 MG tablet Take 50 mg by mouth at bedtime as needed for sleep.    UNKNOWN  . umeclidinium-vilanterol (ANORO ELLIPTA) 62.5-25 MCG/INH AEPB Inhale 1 puff into the lungs every morning.   06/26/2018 at Unknown time  . warfarin (COUMADIN) 5 MG tablet TAKE 1/2 TO 1 TABLET DAILY AS DIRECTED BY COUMADIN CLINIC. (Patient taking differently: Take 2.5-5 mg by mouth See admin instructions. 2.5MG ON ALL DAYS EXCEPT 5MG ON SATURDAYS ONLY. TAKES AT SUPPER) 30 tablet 3 06/25/2018 at 1700   Scheduled: . ALPRAZolam  0.25 mg Oral QHS  . aspirin EC  81 mg Oral QPM  . atorvastatin  40 mg Oral q morning - 10a  . baclofen  10 mg Oral BID  . benazepril  40 mg Oral q morning - 10a  . carvedilol  6.25 mg Oral BID WC  . donepezil  10  mg Oral QHS  . levalbuterol  0.63 mg Nebulization Q8H  . potassium chloride  10 mEq Oral BID  . pregabalin  75 mg Oral QODAY  . Vitamin D (Ergocalciferol)  50,000 Units Oral Q Tue  . warfarin  5 mg Oral Once  . Warfarin - Pharmacist Dosing Inpatient   Does not apply q1800   Continuous:  JIR:[id number] **OR** acetaminophen, ondansetron **OR** ondansetron (ZOFRAN) IV, traZODone  Assesment: She was admitted with acute diastolic heart failure.  She had some issue with her renal function yesterday and appeared euvolemic.  Her weight calculates out at being up about 5 pounds.  This is from her baseline not from admission.  She is not having a lot of symptoms however. Principal Problem:   Acute diastolic heart failure (HCC) Active Problems:   Hyperlipidemia   Essential hypertension   CAD, ARTERY BYPASS GRAFT   S/P AVR (aortic valve replacement)   Dementia   Anemia   CHF (congestive heart failure) ([place])    Plan: Continue treatments.  Will discuss with cardiology regarding resuming diuretics    LOS: 1 day   Sande Pickert L 06/29/2018, 9:06 AM

## 2018-06-29 NOTE — Progress Notes (Signed)
Physical Therapy Treatment Patient Details Name: [name] MRN: [id number] DOB: 24-Aug-1926 Today's Date: 06/29/2018    History of Present Illness [name] is a 84 y.o. female with medical history significant of carotid artery disease, coronary artery disease, LBBB, aortic valve replacement, hyperlipidemia, hypertension, macular degeneration, history of CVA, history of transient global amnesia, unspecified peripheral neuropathy, vitamin D deficiency who is brought to the emergency department after having a fall at home.  Then this afternoon, the patient mentation decrease and she became tachypneic.  EMS was subsequently called.  There has been no fever, chills, sore throat, hemoptysis, chest pain, palpitations, dizziness, diaphoresis, PND or orthopnea.  No abdominal pain, nausea, emesis, diarrhea or constipation.  No dysuria, frequency or hematuria.  No heat or cold intolerance.  No polyuria, polydipsia, polyphagia.  No pruritus.    PT Comments    Pt using BSC with daugther upon entering room.  Pt agreeable to ambulate with therapist.  Able to complete 75 feet today with RW and use of 3L02 via [place].  Pt with good stability overall without drifting today, however tends to look down with ambuation and acquire a forward flexed posture.  Pt able to correct with verbal cues.  Pt returned to bed per request to rest.  No issues or drops in oxygen saturation noted today.            Precautions / Restrictions Precautions Precautions: Fall Restrictions Weight Bearing Restrictions: No    Mobility  Bed Mobility Overal bed mobility: Needs Assistance Bed Mobility: Supine to Sit     Supine to sit: Min guard     General bed mobility comments: slow labored movement  Transfers Overall transfer level: Needs assistance Equipment used: Rolling walker (2 wheeled) Transfers: Sit to/from UGI CorporationStand;Stand Pivot Transfers Sit to Stand: Min guard            Ambulation/Gait Ambulation/Gait  assistance: Editor, commissioningMin assist Gait Distance (Feet): 75 Feet Assistive device: Rolling walker (2 wheeled) Gait Pattern/deviations: Decreased step length - right;Decreased step length - left;Decreased stride length;Trunk flexed     General Gait Details: Pt requires cues to look straight ahead with ambualation and keep upright without forward flexed posturing.           Cognition Arousal/Alertness: Awake/alert Behavior During Therapy: WFL for tasks assessed/performed Overall Cognitive Status: Within Functional Limits for tasks assessed                                               Pertinent Vitals/Pain Pain Assessment: No/denies pain           PT Goals (current goals can now be found in the care plan section) Progress towards PT goals: Progressing toward goals             Time: 1400-1430 PT Time Calculation (min) (ACUTE ONLY): 30 min  Charges:  $Gait Training: 8-22 mins $Therapeutic Activity: 8-22 mins                     Lurena NidaAmy B Frazier, PTA/CLT [phone]    Bascom LevelsFrazier, Amy B 06/29/2018, 3:57 PM

## 2018-06-29 NOTE — Progress Notes (Signed)
ANTICOAGULATION CONSULT NOTE - follow up Consult  Pharmacy Consult for Coumadin Indication: mechanical valve  Allergies  Allergen Reactions  . Codeine   . Penicillins     Patient Measurements: Height: 5\' 6"  (167.6 cm) Weight: 104 lb 4.4 oz (47.3 kg) IBW/kg (Calculated) : 59.3  Vital Signs: Temp: 97.8 F (36.6 C) (08/15 0607) Temp Source: Oral (08/15 0607) BP: 145/58 (08/15 0607) Pulse Rate: 77 (08/15 0607)  Labs: Recent Labs    06/26/18 1935 06/27/18 0018 06/27/18 [id number] 06/27/18 1240 06/28/18 0417 06/29/18 0551  HGB 9.0*  --  9.2*  --  8.8*  --   HCT 29.3*  --  28.6*  --  28.1*  --   PLT 193  --  207  --  206  --   LABPROT 28.0*  --  24.4*  --  23.7* 22.2*  INR 2.65  --  2.22  --  2.14 1.97  CREATININE 0.94  --   --   --  1.11* 1.04*  TROPONINI  --  0.06* 0.06* 0.05*  --   --     Estimated Creatinine Clearance: 27.4 mL/min (A) (by C-G formula based on SCr of 1.04 mg/dL (H)).   Medical History: Past Medical History:  Diagnosis Date  . Carotid artery stenosis    Without infarction  . Coronary artery disease   . Heart valve replaced by other means   . Hypercholesterolemia    Pure  . Hypertension    Unspecified  . LBBB (left bundle branch block)   . Macular degeneration (senile) of retina, unspecified   . Postsurgical aortocoronary bypass status   . Stroke (HCC)   . Transient global amnesia   . Unspecified hereditary and idiopathic peripheral neuropathy   . Unspecified vitamin D deficiency     Medications:  Medications Prior to Admission  Medication Sig Dispense Refill Last Dose  . acetaminophen (TYLENOL 8 HOUR) 650 MG CR tablet Take 650 mg by mouth 2 (two) times daily.   06/26/2018 at Unknown time  . ALPRAZolam (XANAX) 0.25 MG tablet Take 0.25 mg by mouth at bedtime.    06/25/2018 at Unknown time  . aspirin 81 MG EC tablet Take 81 mg by mouth every evening.    06/25/2018 at Unknown time  . atorvastatin (LIPITOR) 40 MG tablet Take 1 tablet (40 mg total)  by mouth daily. (Patient taking differently: Take 40 mg by mouth every morning. ) 90 tablet 3 06/26/2018 at Unknown time  . baclofen (LIORESAL) 10 MG tablet Take 10 mg by mouth 2 (two) times daily.   06/26/2018 at Unknown time  . benazepril (LOTENSIN) 40 MG tablet TAKE (1) TABLET BY MOUTH ONCE DAILY. (Patient taking differently: Take 40 mg by mouth every morning. ) 90 tablet 1 06/26/2018 at Unknown time  . carvedilol (COREG) 6.25 MG tablet Take 6.25 mg by mouth 2 (two) times daily with a meal.   06/26/2018 at 9-930a  . donepezil (ARICEPT) 10 MG tablet TAKE ONE TABLET BY MOUTH ONCE DAILY. (Patient taking differently: Take 10 mg by mouth at bedtime. ) 30 tablet 0 06/25/2018 at Unknown time  . ergocalciferol (VITAMIN D2) 50000 UNITS capsule Take 50,000 Units by mouth every Tuesday.    Past Week at Unknown time  . ferrous sulfate 325 (65 FE) MG EC tablet Take 325 mg by mouth as needed.    Past Month at Unknown time  . furosemide (LASIX) 20 MG tablet TAKE (1) TABLET BY MOUTH ONCE DAILY. (Patient taking differently: Take 20  mg by mouth every morning. ) 30 tablet 5 06/26/2018 at Unknown time  . LYRICA 75 MG capsule TAKE ONE CAPSULE BY MOUTH ONCE DAILY. (Patient taking differently: Take 75 mg by mouth every other day. AT BEDTIME) 30 capsule 0 06/25/2018 at Unknown time  . meclizine (ANTIVERT) 25 MG tablet Take 25 mg by mouth 3 (three) times daily as needed for dizziness.   UNKNOWN  . Melatonin 3 MG TABS Take 3 mg by mouth at bedtime.     06/25/2018 at Unknown time  . Multiple Vitamins-Minerals (VITEYES AREDS FORMULA PO) Take 1 tablet by mouth daily.   Past Month at Unknown time  . potassium chloride (K-DUR) 10 MEQ tablet TAKE ONE TABLET BY MOUTH ONCE DAILY. (Patient taking differently: Take 10 mEq by mouth every morning. ) 30 tablet 6 06/26/2018 at Unknown time  . traMADol (ULTRAM) 50 MG tablet Take 50 mg by mouth every 12 (twelve) hours as needed for moderate pain or severe pain.    UNKNOWN  . traZODone (DESYREL) 50  MG tablet Take 50 mg by mouth at bedtime as needed for sleep.    UNKNOWN  . umeclidinium-vilanterol (ANORO ELLIPTA) 62.5-25 MCG/INH AEPB Inhale 1 puff into the lungs every morning.   06/26/2018 at Unknown time  . warfarin (COUMADIN) 5 MG tablet TAKE 1/2 TO 1 TABLET DAILY AS DIRECTED BY COUMADIN CLINIC. (Patient taking differently: Take 2.5-5 mg by mouth See admin instructions. 2.5MG  ON ALL DAYS EXCEPT 5MG  ON SATURDAYS ONLY. TAKES AT SUPPER) 30 tablet 3 06/25/2018 at 1700    Assessment: 84 year old woman with a history of coronary artery disease and CABG as well as aortic valve replacement. INR at 1.97, just below goal. Will give small booster dose today. Home dose is 2.5mg  every day except , 5mg  on Saturday  Goal of Therapy:  INR goal 2.2-3.0 Monitor platelets by anticoagulation protocol: Yes   Plan:  Coumadin 5mg  x 1 dose today PT-INR daily Monitor for S/S for bleeding  Elder CyphersLorie Barba Solt, BS Loura BackPharm D, BCPS Clinical Pharmacist Pager [phone] 06/29/2018,8:17 AM

## 2018-06-29 NOTE — Progress Notes (Signed)
Progress Note  Patient Name: Colleen CablesRuth E Simpson Date of Encounter: 06/29/2018  Primary Cardiologist: Dr. Jens Somrenshaw  Subjective   She is feeling well and denies chest pain and shortness of breath.  Her weight at home ranges from 98 to 100 pounds as per her daughter.  Weight is 47.3 kg today which is what it was yesterday although she has had 790 cc of recorded output.  Inpatient Medications    Scheduled Meds: . ALPRAZolam  0.25 mg Oral QHS  . aspirin EC  81 mg Oral QPM  . atorvastatin  40 mg Oral q morning - 10a  . baclofen  10 mg Oral BID  . benazepril  40 mg Oral q morning - 10a  . carvedilol  6.25 mg Oral BID WC  . donepezil  10 mg Oral QHS  . levalbuterol  0.63 mg Nebulization Q8H  . potassium chloride  10 mEq Oral BID  . pregabalin  75 mg Oral QODAY  . Vitamin D (Ergocalciferol)  50,000 Units Oral Q Tue  . warfarin  5 mg Oral Once  . Warfarin - Pharmacist Dosing Inpatient   Does not apply q1800   Continuous Infusions:  PRN Meds: acetaminophen **OR** acetaminophen, ondansetron **OR** ondansetron (ZOFRAN) IV, traZODone   Vital Signs    Vitals:   06/28/18 2312 06/29/18 0607 06/29/18 0609 06/29/18 0831  BP:  (!) 145/58    Pulse:  77    Resp:  18    Temp:  97.8 F (36.6 C)    TempSrc:  Oral    SpO2: 98% 99%  97%  Weight:   47.3 kg   Height:        Intake/Output Summary (Last 24 hours) at 06/29/2018 0944 Last data filed at 06/28/2018 2100 Gross per 24 hour  Intake 360 ml  Output 300 ml  Net 60 ml   Filed Weights   06/27/18 0435 06/28/18 0400 06/29/18 0609  Weight: 46 kg 47.3 kg 47.3 kg    Telemetry    Sinus rhythm- Personally Reviewed  ECG    NA - Personally Reviewed  Physical Exam   GEN: No acute distress.   Neck: No JVD Cardiac: RRR, 2/6 systolic murmur loudest over right upper sternal border, no rubs, or gallops.  Respiratory:  Faint bibasilar crackles. GI: Soft, nontender, non-distended  MS: No edema; No deformity. Neuro:  Nonfocal  Psych:  Normal affect   Labs    Chemistry Recent Labs  Lab 06/26/18 1935 06/28/18 0417 06/29/18 0551  NA 143 142 144  K 4.3 3.8 3.9  CL 106 101 103  CO2 31 32 33*  GLUCOSE 112* 104* 90  BUN 23 36* 35*  CREATININE 0.94 1.11* 1.04*  CALCIUM 9.1 8.6* 8.8*  PROT 6.5  --   --   ALBUMIN 3.5  --   --   AST 36  --   --   ALT 24  --   --   ALKPHOS 73  --   --   BILITOT 0.7  --   --   GFRNONAA 52* 43* 46*  GFRAA >60 50* 54*  ANIONGAP 6 9 8      Hematology Recent Labs  Lab 06/26/18 1935 06/27/18 0614 06/28/18 0417  WBC 8.9 7.9 11.3*  RBC 2.99* 2.95* 2.90*  HGB 9.0* 9.2* 8.8*  HCT 29.3* 28.6* 28.1*  MCV 98.0 96.9 96.9  MCH 30.1 31.2 30.3  MCHC 30.7 32.2 31.3  RDW 14.0 14.0 14.1  PLT 193 207 206  Cardiac Enzymes Recent Labs  Lab 06/27/18 0018 06/27/18 0614 06/27/18 1240  TROPONINI 0.06* 0.06* 0.05*    Recent Labs  Lab 06/26/18 2136  TROPIPOC 0.02     BNP Recent Labs  Lab 06/26/18 1935  BNP 1,329.0*     DDimer No results for input(s): DDIMER in the last 168 hours.   Radiology    No results found.  Cardiac Studies   None this admission  Patient Profile     84 y.o. female with a hx of coronary artery disease and valvular heart diseasewho is being seen today for the evaluation of acute diastolic heart failureat the request ofDr. Juanetta GoslingHawkins.  Assessment & Plan    1. Acute diastolic heart failure: She is symptomatically improved. Uncertain about weight accuracy as it remains at 47.3 kg which is what it was yesterday although she has a net recorded output of 790 cc.  She received 40 mg IV Lasix yesterday.  I gave her 1 dose of IV hydralazine yesterday as well for elevated blood pressure.  I am going to start low-dose oral hydralazine 10 mg twice daily. BUN 35, creatinine 1.04 today. She took Lasix 20 mg daily at home.  I will start oral Lasix 40 mg daily today and see how she responds.  2. Coronary disease with CABG: Cardiac catheterization from 2011  reviewed demonstrating occlusion of all grafts. Continue medical therapy with aspirin, atorvastatin, benazepril, and carvedilol.  3. Hypertension: Blood pressure  is mildly elevated.  I am going to start low-dose hydralazine 10 mg orally twice daily.  4. Hyperlipidemia: Continue atorvastatin.  5. Status post AVR: Normal bioprosthetic function by echocardiogram in March 2019.   6. Bilateral carotid artery disease: 1 to 39% bilateral internal carotid artery stenosis by Dopplers on 06/21/2018. Follow-up study planned for 1 year.   For questions or updates, please contact [facility] Please consult [email or web address] for contact info under Cardiology/STEMI.      Signed, Prentice DockerSuresh Rustin Erhart, MD  06/29/2018, 9:44 AM

## 2018-06-30 LAB — BASIC METABOLIC PANEL
ANION GAP: 5 (ref 5–15)
BUN: 35 mg/dL — ABNORMAL HIGH (ref 8–23)
CALCIUM: 8.8 mg/dL — AB (ref 8.9–10.3)
CO2: 34 mmol/L — ABNORMAL HIGH (ref 22–32)
Chloride: 105 mmol/L (ref 98–111)
Creatinine, Ser: 1.01 mg/dL — ABNORMAL HIGH (ref 0.44–1.00)
GFR calc non Af Amer: 48 mL/min — ABNORMAL LOW (ref >60)
GFR, EST AFRICAN AMERICAN: 55 mL/min — AB (ref >60)
Glucose, Bld: 95 mg/dL (ref 70–99)
Potassium: 4.3 mmol/L (ref 3.5–5.1)
SODIUM: 144 mmol/L (ref 135–145)

## 2018-06-30 LAB — CBC
HCT: 31.3 % — ABNORMAL LOW (ref 36.0–46.0)
HEMOGLOBIN: 9.5 g/dL — AB (ref 12.0–15.0)
MCH: 30 pg (ref 26.0–34.0)
MCHC: 30.4 g/dL (ref 30.0–36.0)
MCV: 98.7 fL (ref 78.0–100.0)
Platelets: 233 10*3/uL (ref 150–400)
RBC: 3.17 MIL/uL — AB (ref 3.87–5.11)
RDW: 14.2 % (ref 11.5–15.5)
WBC: 9.5 10*3/uL (ref 4.0–10.5)

## 2018-06-30 LAB — PROTIME-INR
INR: 2.18
PROTHROMBIN TIME: 24.1 s — AB (ref 11.4–15.2)

## 2018-06-30 MED ORDER — HYDRALAZINE HCL 25 MG PO TABS
25.0000 mg | ORAL_TABLET | Freq: Two times a day (BID) | ORAL | Status: DC
  Administered 2018-06-30: 25 mg via ORAL
  Filled 2018-06-30: qty 1

## 2018-06-30 MED ORDER — WARFARIN SODIUM 2.5 MG PO TABS
2.5000 mg | ORAL_TABLET | Freq: Once | ORAL | Status: AC
  Administered 2018-06-30: 2.5 mg via ORAL
  Filled 2018-06-30: qty 1

## 2018-06-30 MED ORDER — LEVALBUTEROL HCL 0.63 MG/3ML IN NEBU: 0.6300 mg | INHALATION_SOLUTION | Freq: Three times a day (TID) | RESPIRATORY_TRACT | Status: DC | PRN

## 2018-06-30 NOTE — Progress Notes (Addendum)
ANTICOAGULATION CONSULT NOTE - follow up Consult  Pharmacy Consult for Coumadin Indication: mechanical valve Home dose: 2.5mg  every day except , 5mg  on Saturday  Patient Measurements: Height: 5\' 6"  (167.6 cm) Weight: 102 lb 8.2 oz (46.5 kg) IBW/kg (Calculated) : 59.3  Vital Signs: Temp: 98.1 F (36.7 C) (08/16 0510) Temp Source: Oral (08/16 0510) BP: 151/59 (08/16 0551) Pulse Rate: 56 (08/16 0551)  Labs: Recent Labs    06/27/18 1240 06/28/18 0417 06/29/18 0551 06/30/18 0610  HGB  --  8.8*  --   --   HCT  --  28.1*  --   --   PLT  --  206  --   --   LABPROT  --  23.7* 22.2* 24.1*  INR  --  2.14 1.97 2.18  CREATININE  --  1.11* 1.04* 1.01*  TROPONINI 0.05*  --   --   --     Estimated Creatinine Clearance: 27.7 mL/min (A) (by C-G formula based on SCr of 1.01 mg/dL (H)).     Assessment: 84 year old woman with a history of coronary artery disease and CABG as well as aortic valve replacement. . INR today: 2.18 (basically, within goal range)  Goal of Therapy:  INR goal 2.2-3.0 Monitor platelets by anticoagulation protocol: Yes   Plan:  Give warfarin 2.5mg  today PT-INR daily Monitor for S/S for bleeding  Tama Highamara Ceniyah Thorp, Pharm. D. Clinical Pharmacist 06/30/2018 10:49 AM

## 2018-06-30 NOTE — Progress Notes (Signed)
Patients heels are reddened and patient complains that they burn at times. Heel allevyn are applied bilaterally. Patient complains with more pain on the left heel than the right. Will continue to monitor.

## 2018-06-30 NOTE — Progress Notes (Signed)
Subjective: She says she feels okay.  Her breathing seems better.  She is not having to prop up as much.  She still has some swelling of both legs.  She is complaining of some pain in her left heel.  No chest pain.  Objective: Vital signs in last 24 hours: Temp:  [97.8 F (36.6 C)-98.8 F (37.1 C)] 98.1 F (36.7 C) (08/16 0510) Pulse Rate:  [56-66] 56 (08/16 0551) Resp:  [16-18] 17 (08/16 0510) BP: (126-162)/(45-68) 151/59 (08/16 0551) SpO2:  [97 %-99 %] 99 % (08/16 0510) Weight:  [46.5 kg] 46.5 kg (08/16 0510) Weight change: -0.8 kg Last BM Date: 06/29/18  Intake/Output from previous day: 08/15 0701 - 08/16 0700 In: 480 [P.O.:480] Out: -   PHYSICAL EXAM General appearance: alert, cooperative and Very thin and very hard of hearing Resp: clear to auscultation bilaterally Cardio: Regular with systolic murmur GI: soft, non-tender; bowel sounds normal; no masses,  no organomegaly Extremities: She has some erythema of the left heel  Lab Results:  Results for orders placed or performed during the hospital encounter of 06/26/18 (from the past 48 hour(s))  Protime-INR     Status: Abnormal   Collection Time: 06/29/18  5:51 AM  Result Value Ref Range   Prothrombin Time 22.2 (H) 11.4 - 15.2 seconds   INR 1.97     Comment: Performed at [facility], [address], [place], [place] [address]  Basic metabolic panel     Status: Abnormal   Collection Time: 06/29/18  5:51 AM  Result Value Ref Range   Sodium 144 135 - 145 mmol/L   Potassium 3.9 3.5 - 5.1 mmol/L   Chloride 103 98 - 111 mmol/L   CO2 33 (H) 22 - 32 mmol/L   Glucose, Bld 90 70 - 99 mg/dL   BUN 35 (H) 8 - 23 mg/dL   Creatinine, Ser 1.04 (H) 0.44 - 1.00 mg/dL   Calcium 8.8 (L) 8.9 - 10.3 mg/dL   GFR calc non Af Amer 46 (L) >60 mL/min   GFR calc Af Amer 54 (L) >60 mL/min    Comment: (NOTE) The eGFR has been calculated using the CKD EPI equation. This calculation has not been validated in all clinical situations. eGFR's  persistently <60 mL/min signify possible Chronic Kidney Disease.    Anion gap 8 5 - 15    Comment: Performed at [facility], [address], [place], [place] [address]  Protime-INR     Status: Abnormal   Collection Time: 06/30/18  6:10 AM  Result Value Ref Range   Prothrombin Time 24.1 (H) 11.4 - 15.2 seconds   INR 2.18     Comment: Performed at [facility], [address] [place], [place] [address]  Basic metabolic panel     Status: Abnormal   Collection Time: 06/30/18  6:10 AM  Result Value Ref Range   Sodium 144 135 - 145 mmol/L   Potassium 4.3 3.5 - 5.1 mmol/L   Chloride 105 98 - 111 mmol/L   CO2 34 (H) 22 - 32 mmol/L   Glucose, Bld 95 70 - 99 mg/dL   BUN 35 (H) 8 - 23 mg/dL   Creatinine, Ser 1.01 (H) 0.44 - 1.00 mg/dL   Calcium 8.8 (L) 8.9 - 10.3 mg/dL   GFR calc non Af Amer 48 (L) >60 mL/min   GFR calc Af Amer 55 (L) >60 mL/min    Comment: (NOTE) The eGFR has been calculated using the CKD EPI equation. This calculation has  not been validated in all clinical situations. eGFR's persistently <60 mL/min signify possible Chronic Kidney Disease.    Anion gap 5 5 - 15    Comment: Performed at [facility], [address] [place], [place] [address]    ABGS No results for input(s): PHART, PO2ART, TCO2, HCO3 in the last 72 hours.  Invalid input(s): PCO2 CULTURES Recent Results (from the past 240 hour(s))  MRSA PCR Screening     Status: None   Collection Time: 06/27/18  1:52 AM  Result Value Ref Range Status   MRSA by PCR NEGATIVE NEGATIVE Final    Comment:        The GeneXpert MRSA Assay (FDA approved for NASAL specimens only), is one component of a comprehensive MRSA colonization surveillance program. It is not intended to diagnose MRSA infection nor to guide or monitor treatment for MRSA infections. Performed at [facility], [address] [place], [place] [address]    Studies/Results: No results found.  Medications:  Prior to Admission:  Medications  Prior to Admission  Medication Sig Dispense Refill Last Dose  . acetaminophen (TYLENOL 8 HOUR) 650 MG CR tablet Take 650 mg by mouth 2 (two) times daily.   06/26/2018 at Unknown time  . ALPRAZolam (XANAX) 0.25 MG tablet Take 0.25 mg by mouth at bedtime.    06/25/2018 at Unknown time  . aspirin 81 MG EC tablet Take 81 mg by mouth every evening.    06/25/2018 at Unknown time  . atorvastatin (LIPITOR) 40 MG tablet Take 1 tablet (40 mg total) by mouth daily. (Patient taking differently: Take 40 mg by mouth every morning. ) 90 tablet 3 06/26/2018 at Unknown time  . baclofen (LIORESAL) 10 MG tablet Take 10 mg by mouth 2 (two) times daily.   06/26/2018 at Unknown time  . benazepril (LOTENSIN) 40 MG tablet TAKE (1) TABLET BY MOUTH ONCE DAILY. (Patient taking differently: Take 40 mg by mouth every morning. ) 90 tablet 1 06/26/2018 at Unknown time  . carvedilol (COREG) 6.25 MG tablet Take 6.25 mg by mouth 2 (two) times daily with a meal.   06/26/2018 at 9-930a  . donepezil (ARICEPT) 10 MG tablet TAKE ONE TABLET BY MOUTH ONCE DAILY. (Patient taking differently: Take 10 mg by mouth at bedtime. ) 30 tablet 0 06/25/2018 at Unknown time  . ergocalciferol (VITAMIN D2) 50000 UNITS capsule Take 50,000 Units by mouth every Tuesday.    Past Week at Unknown time  . ferrous sulfate 325 (65 FE) MG EC tablet Take 325 mg by mouth as needed.    Past Month at Unknown time  . furosemide (LASIX) 20 MG tablet TAKE (1) TABLET BY MOUTH ONCE DAILY. (Patient taking differently: Take 20 mg by mouth every morning. ) 30 tablet 5 06/26/2018 at Unknown time  . LYRICA 75 MG capsule TAKE ONE CAPSULE BY MOUTH ONCE DAILY. (Patient taking differently: Take 75 mg by mouth every other day. AT BEDTIME) 30 capsule 0 06/25/2018 at Unknown time  . meclizine (ANTIVERT) 25 MG tablet Take 25 mg by mouth 3 (three) times daily as needed for dizziness.   UNKNOWN  . Melatonin 3 MG TABS Take 3 mg by mouth at bedtime.     06/25/2018 at Unknown time  . Multiple  Vitamins-Minerals (VITEYES AREDS FORMULA PO) Take 1 tablet by mouth daily.   Past Month at Unknown time  . potassium chloride (K-DUR) 10 MEQ tablet TAKE ONE TABLET BY MOUTH ONCE DAILY. (Patient taking differently: Take 10 mEq by mouth every  morning. ) 30 tablet 6 06/26/2018 at Unknown time  . traMADol (ULTRAM) 50 MG tablet Take 50 mg by mouth every 12 (twelve) hours as needed for moderate pain or severe pain.    UNKNOWN  . traZODone (DESYREL) 50 MG tablet Take 50 mg by mouth at bedtime as needed for sleep.    UNKNOWN  . umeclidinium-vilanterol (ANORO ELLIPTA) 62.5-25 MCG/INH AEPB Inhale 1 puff into the lungs every morning.   06/26/2018 at Unknown time  . warfarin (COUMADIN) 5 MG tablet TAKE 1/2 TO 1 TABLET DAILY AS DIRECTED BY COUMADIN CLINIC. (Patient taking differently: Take 2.5-5 mg by mouth See admin instructions. 2.5MG ON ALL DAYS EXCEPT 5MG ON SATURDAYS ONLY. TAKES AT SUPPER) 30 tablet 3 06/25/2018 at 1700   Scheduled: . ALPRAZolam  0.25 mg Oral QHS  . aspirin EC  81 mg Oral QPM  . atorvastatin  40 mg Oral q morning - 10a  . baclofen  10 mg Oral BID  . benazepril  40 mg Oral q morning - 10a  . carvedilol  6.25 mg Oral BID WC  . donepezil  10 mg Oral QHS  . furosemide  40 mg Oral Daily  . hydrALAZINE  25 mg Oral BID  . potassium chloride  10 mEq Oral BID  . pregabalin  75 mg Oral QODAY  . Vitamin D (Ergocalciferol)  50,000 Units Oral Q Tue  . Warfarin - Pharmacist Dosing Inpatient   Does not apply q1800   Continuous:  TGY:[id number] **OR** acetaminophen, levalbuterol, ondansetron **OR** ondansetron (ZOFRAN) IV, traZODone  Assesment: She was admitted with acute diastolic heart failure and acute hip pain.  She did not have a hip fracture.  She has done pretty well with diuresis and her weight is down today.  She had some increase in her creatinine and her diuretics have been decreased.  She does feel better.  She has deconditioning and it has been recommended that she go to skilled  care facility.  She has hypertension and hydralazine was added yesterday but she is still been in the 811X and 726O systolic so I increased hydralazine to 25 mg twice daily  She has dementia which is stable  She is very hard of hearing Principal Problem:   Acute diastolic heart failure ([place]) Active Problems:   Hyperlipidemia   Essential hypertension   CAD, ARTERY BYPASS GRAFT   S/P AVR (aortic valve replacement)   Dementia   Anemia   CHF (congestive heart failure) ([place])    Plan: Continue treatments.  Increase hydralazine.  Monitor her renal function.  Continue oxygen    LOS: 2 days   Kym Fenter L 06/30/2018, 8:45 AM

## 2018-06-30 NOTE — Progress Notes (Signed)
Dr. Juanetta GoslingHawkins returned page and states to hold the blood pressure medication for the remainder of the day.

## 2018-06-30 NOTE — Progress Notes (Signed)
Progress Note  Patient Name: Colleen CablesRuth E [name] Date of Encounter: 06/30/2018  Primary Cardiologist: Dr. Jens Somrenshaw  Subjective   She denies chest pain. Breathing continues to improve. Has some left heel pain. Plan is to [facility] tomorrow.  Inpatient Medications    Scheduled Meds: . ALPRAZolam  0.25 mg Oral QHS  . aspirin EC  81 mg Oral QPM  . atorvastatin  40 mg Oral q morning - 10a  . baclofen  10 mg Oral BID  . benazepril  40 mg Oral q morning - 10a  . carvedilol  6.25 mg Oral BID WC  . donepezil  10 mg Oral QHS  . furosemide  40 mg Oral Daily  . hydrALAZINE  25 mg Oral BID  . potassium chloride  10 mEq Oral BID  . pregabalin  75 mg Oral QODAY  . Vitamin D (Ergocalciferol)  50,000 Units Oral Q Tue  . Warfarin - Pharmacist Dosing Inpatient   Does not apply q1800   Continuous Infusions:  PRN Meds: acetaminophen **OR** acetaminophen, levalbuterol, ondansetron **OR** ondansetron (ZOFRAN) IV, traZODone   Vital Signs    Vitals:   06/29/18 2148 06/29/18 2359 06/30/18 0510 06/30/18 0551  BP: (!) 126/45  (!) 162/52 (!) 151/59  Pulse: 66  (!) 59 (!) 56  Resp: 16  17   Temp: 98.8 F (37.1 C)  98.1 F (36.7 C)   TempSrc: Oral  Oral   SpO2: 98% 98% 99%   Weight:   46.5 kg   Height:        Intake/Output Summary (Last 24 hours) at 06/30/2018 0918 Last data filed at 06/30/2018 0500 Gross per 24 hour  Intake 240 ml  Output -  Net 240 ml   Filed Weights   06/28/18 0400 06/29/18 0609 06/30/18 0510  Weight: 47.3 kg 47.3 kg 46.5 kg    Telemetry    Sinus rhythm - Personally Reviewed  ECG    NA - Personally Reviewed  Physical Exam   GEN: No acute distress.   Neck: No JVD Cardiac: RRR, 2/6 systolic murmur loudest over right upper sternal border, no rubs, or gallops.  Respiratory: Clear to auscultation bilaterally. GI: Soft, nontender, non-distended  MS: No edema; +left heel tenderness with erythema Neuro:  Nonfocal  Psych: Normal affect    Labs    Chemistry Recent Labs  Lab 06/26/18 1935 06/28/18 0417 06/29/18 0551 06/30/18 0610  NA 143 142 144 144  K 4.3 3.8 3.9 4.3  CL 106 101 103 105  CO2 31 32 33* 34*  GLUCOSE 112* 104* 90 95  BUN 23 36* 35* 35*  CREATININE 0.94 1.11* 1.04* 1.01*  CALCIUM 9.1 8.6* 8.8* 8.8*  PROT 6.5  --   --   --   ALBUMIN 3.5  --   --   --   AST 36  --   --   --   ALT 24  --   --   --   ALKPHOS 73  --   --   --   BILITOT 0.7  --   --   --   GFRNONAA 52* 43* 46* 48*  GFRAA >60 50* 54* 55*  ANIONGAP 6 9 8 5      Hematology Recent Labs  Lab 06/26/18 1935 06/27/18 0614 06/28/18 0417  WBC 8.9 7.9 11.3*  RBC 2.99* 2.95* 2.90*  HGB 9.0* 9.2* 8.8*  HCT 29.3* 28.6* 28.1*  MCV 98.0 96.9 96.9  MCH 30.1 31.2 30.3  MCHC 30.7 32.2 31.3  RDW 14.0 14.0 14.1  PLT 193 207 206    Cardiac Enzymes Recent Labs  Lab 06/27/18 0018 06/27/18 0614 06/27/18 1240  TROPONINI 0.06* 0.06* 0.05*    Recent Labs  Lab 06/26/18 2136  TROPIPOC 0.02     BNP Recent Labs  Lab 06/26/18 1935  BNP 1,329.0*     DDimer No results for input(s): DDIMER in the last 168 hours.   Radiology    No results found.  Cardiac Studies   None this admission  Patient Profile     84 y.o. female with a hx of coronary artery disease and valvular heart diseasewho is being seen today for the evaluation of acute diastolic heart failureat the request ofDr. Juanetta GoslingHawkins.  Assessment & Plan    1. Acute diastolic heart failure: She is symptomatically improved.Weight down to 46.5 kg from 47.3 kg  Yesterday. Uncertain about I/O accuracy.Continue oral Lasix 40 mg daily. Hydralazine increased to 25 mg bid for better BP control.  2. Coronary disease with CABG: Cardiac catheterization from 2011 reviewed demonstrating occlusion of all grafts. Continue medical therapy with aspirin, atorvastatin, benazepril, and carvedilol.  3. Hypertension: Blood pressureremains elevated. I started low-dose hydralazine 10 mg  orally twice daily on 8/15 but due to persistently elevated BP, Dr. Juanetta GoslingHawkins has increased to 25 mg bid.  4. Hyperlipidemia: Continue atorvastatin.  5. Status post AVR: Normal bioprosthetic function by echocardiogram in March 2019.   6. Bilateral carotid artery disease: 1 to 39% bilateral internal carotid artery stenosis by Dopplers on 06/21/2018. Follow-up study planned for 1 year.  [facility] will sign off.   Medication Recommendations:  Continue therapies as above Other recommendations (labs, testing, etc): follow renal function with diuretic use Follow up as an outpatient:  With Dr. Jens Somrenshaw   For questions or updates, please contact [facility] Please consult [email or web address] for contact info under Cardiology/STEMI.      Signed, Prentice DockerSuresh Koneswaran, MD  06/30/2018, 9:18 AM

## 2018-06-30 NOTE — Progress Notes (Signed)
Nurse Tech made aware of Blood Pressure. Dr. Juanetta GoslingHawkins was paged and awaiting his call. Patient is resting in bed with family at bedside. Patient states that she "feels fine but I feel like it is getting harder to breathe." Patient is on 3L of oxygen and states that she wears 4L at home. Patient offers no other complaints at this time. Will continue to monitor and await Dr. Juanetta GoslingHawkins phone call.

## 2018-07-01 ENCOUNTER — Non-Acute Institutional Stay (SKILLED_NURSING_FACILITY): Payer: Medicare Other | Admitting: Internal Medicine

## 2018-07-01 ENCOUNTER — Inpatient Hospital Stay (HOSPITAL_COMMUNITY): Payer: Medicare Other

## 2018-07-01 ENCOUNTER — Encounter: Payer: Self-pay | Admitting: Internal Medicine

## 2018-07-01 DIAGNOSIS — R2689 Other abnormalities of gait and mobility: Secondary | ICD-10-CM | POA: Diagnosis not present

## 2018-07-01 DIAGNOSIS — Z79899 Other long term (current) drug therapy: Secondary | ICD-10-CM | POA: Diagnosis not present

## 2018-07-01 DIAGNOSIS — S3992XA Unspecified injury of lower back, initial encounter: Secondary | ICD-10-CM | POA: Diagnosis not present

## 2018-07-01 DIAGNOSIS — I259 Chronic ischemic heart disease, unspecified: Secondary | ICD-10-CM | POA: Diagnosis not present

## 2018-07-01 DIAGNOSIS — M6281 Muscle weakness (generalized): Secondary | ICD-10-CM | POA: Diagnosis not present

## 2018-07-01 DIAGNOSIS — I25700 Atherosclerosis of coronary artery bypass graft(s), unspecified, with unstable angina pectoris: Secondary | ICD-10-CM | POA: Diagnosis not present

## 2018-07-01 DIAGNOSIS — Z01818 Encounter for other preprocedural examination: Secondary | ICD-10-CM | POA: Diagnosis not present

## 2018-07-01 DIAGNOSIS — E43 Unspecified severe protein-calorie malnutrition: Secondary | ICD-10-CM | POA: Diagnosis present

## 2018-07-01 DIAGNOSIS — E785 Hyperlipidemia, unspecified: Secondary | ICD-10-CM | POA: Diagnosis present

## 2018-07-01 DIAGNOSIS — Z681 Body mass index (BMI) 19 or less, adult: Secondary | ICD-10-CM | POA: Diagnosis not present

## 2018-07-01 DIAGNOSIS — I5031 Acute diastolic (congestive) heart failure: Secondary | ICD-10-CM

## 2018-07-01 DIAGNOSIS — R0989 Other specified symptoms and signs involving the circulatory and respiratory systems: Secondary | ICD-10-CM | POA: Diagnosis not present

## 2018-07-01 DIAGNOSIS — I25708 Atherosclerosis of coronary artery bypass graft(s), unspecified, with other forms of angina pectoris: Secondary | ICD-10-CM | POA: Diagnosis not present

## 2018-07-01 DIAGNOSIS — J449 Chronic obstructive pulmonary disease, unspecified: Secondary | ICD-10-CM | POA: Diagnosis not present

## 2018-07-01 DIAGNOSIS — I5033 Acute on chronic diastolic (congestive) heart failure: Secondary | ICD-10-CM | POA: Diagnosis not present

## 2018-07-01 DIAGNOSIS — I2510 Atherosclerotic heart disease of native coronary artery without angina pectoris: Secondary | ICD-10-CM | POA: Diagnosis present

## 2018-07-01 DIAGNOSIS — L89609 Pressure ulcer of unspecified heel, unspecified stage: Secondary | ICD-10-CM | POA: Diagnosis present

## 2018-07-01 DIAGNOSIS — J441 Chronic obstructive pulmonary disease with (acute) exacerbation: Secondary | ICD-10-CM | POA: Diagnosis present

## 2018-07-01 DIAGNOSIS — Y92009 Unspecified place in unspecified non-institutional (private) residence as the place of occurrence of the external cause: Secondary | ICD-10-CM | POA: Diagnosis not present

## 2018-07-01 DIAGNOSIS — D508 Other iron deficiency anemias: Secondary | ICD-10-CM | POA: Diagnosis not present

## 2018-07-01 DIAGNOSIS — Z87891 Personal history of nicotine dependence: Secondary | ICD-10-CM | POA: Diagnosis not present

## 2018-07-01 DIAGNOSIS — F0390 Unspecified dementia without behavioral disturbance: Secondary | ICD-10-CM | POA: Diagnosis present

## 2018-07-01 DIAGNOSIS — J9611 Chronic respiratory failure with hypoxia: Secondary | ICD-10-CM

## 2018-07-01 DIAGNOSIS — Z7901 Long term (current) use of anticoagulants: Secondary | ICD-10-CM | POA: Diagnosis not present

## 2018-07-01 DIAGNOSIS — Z8673 Personal history of transient ischemic attack (TIA), and cerebral infarction without residual deficits: Secondary | ICD-10-CM

## 2018-07-01 DIAGNOSIS — R262 Difficulty in walking, not elsewhere classified: Secondary | ICD-10-CM | POA: Diagnosis not present

## 2018-07-01 DIAGNOSIS — S72011A Unspecified intracapsular fracture of right femur, initial encounter for closed fracture: Secondary | ICD-10-CM | POA: Diagnosis present

## 2018-07-01 DIAGNOSIS — I429 Cardiomyopathy, unspecified: Secondary | ICD-10-CM | POA: Diagnosis present

## 2018-07-01 DIAGNOSIS — I10 Essential (primary) hypertension: Secondary | ICD-10-CM | POA: Diagnosis not present

## 2018-07-01 DIAGNOSIS — H3530 Unspecified macular degeneration: Secondary | ICD-10-CM | POA: Diagnosis present

## 2018-07-01 DIAGNOSIS — I5022 Chronic systolic (congestive) heart failure: Secondary | ICD-10-CM | POA: Diagnosis not present

## 2018-07-01 DIAGNOSIS — I5032 Chronic diastolic (congestive) heart failure: Secondary | ICD-10-CM | POA: Diagnosis not present

## 2018-07-01 DIAGNOSIS — E441 Mild protein-calorie malnutrition: Secondary | ICD-10-CM | POA: Diagnosis not present

## 2018-07-01 DIAGNOSIS — Z951 Presence of aortocoronary bypass graft: Secondary | ICD-10-CM | POA: Diagnosis not present

## 2018-07-01 DIAGNOSIS — S72001A Fracture of unspecified part of neck of right femur, initial encounter for closed fracture: Secondary | ICD-10-CM | POA: Diagnosis not present

## 2018-07-01 DIAGNOSIS — M810 Age-related osteoporosis without current pathological fracture: Secondary | ICD-10-CM | POA: Diagnosis present

## 2018-07-01 DIAGNOSIS — Z952 Presence of prosthetic heart valve: Secondary | ICD-10-CM | POA: Diagnosis not present

## 2018-07-01 DIAGNOSIS — Z9181 History of falling: Secondary | ICD-10-CM | POA: Diagnosis not present

## 2018-07-01 DIAGNOSIS — I5043 Acute on chronic combined systolic (congestive) and diastolic (congestive) heart failure: Secondary | ICD-10-CM | POA: Diagnosis present

## 2018-07-01 DIAGNOSIS — Z823 Family history of stroke: Secondary | ICD-10-CM | POA: Diagnosis not present

## 2018-07-01 DIAGNOSIS — I4891 Unspecified atrial fibrillation: Secondary | ICD-10-CM | POA: Diagnosis present

## 2018-07-01 DIAGNOSIS — M25551 Pain in right hip: Secondary | ICD-10-CM | POA: Diagnosis present

## 2018-07-01 DIAGNOSIS — E7800 Pure hypercholesterolemia, unspecified: Secondary | ICD-10-CM | POA: Diagnosis present

## 2018-07-01 DIAGNOSIS — R1084 Generalized abdominal pain: Secondary | ICD-10-CM | POA: Diagnosis not present

## 2018-07-01 DIAGNOSIS — R110 Nausea: Secondary | ICD-10-CM | POA: Diagnosis present

## 2018-07-01 DIAGNOSIS — Z9981 Dependence on supplemental oxygen: Secondary | ICD-10-CM | POA: Diagnosis not present

## 2018-07-01 DIAGNOSIS — I130 Hypertensive heart and chronic kidney disease with heart failure and stage 1 through stage 4 chronic kidney disease, or unspecified chronic kidney disease: Secondary | ICD-10-CM | POA: Diagnosis present

## 2018-07-01 DIAGNOSIS — N183 Chronic kidney disease, stage 3 (moderate): Secondary | ICD-10-CM | POA: Diagnosis present

## 2018-07-01 DIAGNOSIS — D649 Anemia, unspecified: Secondary | ICD-10-CM | POA: Diagnosis not present

## 2018-07-01 DIAGNOSIS — M545 Low back pain: Secondary | ICD-10-CM | POA: Diagnosis not present

## 2018-07-01 DIAGNOSIS — S72001D Fracture of unspecified part of neck of right femur, subsequent encounter for closed fracture with routine healing: Secondary | ICD-10-CM | POA: Diagnosis not present

## 2018-07-01 DIAGNOSIS — W1839XA Other fall on same level, initial encounter: Secondary | ICD-10-CM | POA: Diagnosis not present

## 2018-07-01 DIAGNOSIS — I509 Heart failure, unspecified: Secondary | ICD-10-CM | POA: Diagnosis not present

## 2018-07-01 DIAGNOSIS — R112 Nausea with vomiting, unspecified: Secondary | ICD-10-CM | POA: Diagnosis not present

## 2018-07-01 DIAGNOSIS — D631 Anemia in chronic kidney disease: Secondary | ICD-10-CM | POA: Diagnosis present

## 2018-07-01 DIAGNOSIS — I110 Hypertensive heart disease with heart failure: Secondary | ICD-10-CM | POA: Diagnosis not present

## 2018-07-01 DIAGNOSIS — N179 Acute kidney failure, unspecified: Secondary | ICD-10-CM | POA: Diagnosis present

## 2018-07-01 DIAGNOSIS — Z66 Do not resuscitate: Secondary | ICD-10-CM | POA: Diagnosis present

## 2018-07-01 DIAGNOSIS — E8889 Other specified metabolic disorders: Secondary | ICD-10-CM | POA: Diagnosis present

## 2018-07-01 LAB — BASIC METABOLIC PANEL
ANION GAP: 9 (ref 5–15)
BUN: 36 mg/dL — ABNORMAL HIGH (ref 8–23)
CALCIUM: 9 mg/dL (ref 8.9–10.3)
CHLORIDE: 104 mmol/L (ref 98–111)
CO2: 30 mmol/L (ref 22–32)
Creatinine, Ser: 0.99 mg/dL (ref 0.44–1.00)
GFR calc non Af Amer: 49 mL/min — ABNORMAL LOW (ref >60)
GFR, EST AFRICAN AMERICAN: 57 mL/min — AB (ref >60)
Glucose, Bld: 91 mg/dL (ref 70–99)
Potassium: 4.4 mmol/L (ref 3.5–5.1)
Sodium: 143 mmol/L (ref 135–145)

## 2018-07-01 LAB — PROTIME-INR
INR: 2.29
Prothrombin Time: 25 seconds — ABNORMAL HIGH (ref 11.4–15.2)

## 2018-07-01 MED ORDER — LEVALBUTEROL HCL 0.63 MG/3ML IN NEBU: 0.6300 mg | INHALATION_SOLUTION | Freq: Three times a day (TID) | RESPIRATORY_TRACT | 12 refills | Status: DC | PRN

## 2018-07-01 MED ORDER — ACETAMINOPHEN 325 MG PO TABS: 650.0000 mg | ORAL_TABLET | Freq: Four times a day (QID) | ORAL | Status: DC | PRN

## 2018-07-01 MED ORDER — MELATONIN 3 MG PO TABS: 3.0000 mg | ORAL_TABLET | Freq: Every evening | ORAL | 0 refills | Status: DC | PRN

## 2018-07-01 MED ORDER — HYDRALAZINE HCL 10 MG PO TABS: 10.0000 mg | ORAL_TABLET | Freq: Two times a day (BID) | ORAL | Status: DC

## 2018-07-01 MED ORDER — HYDRALAZINE HCL 10 MG PO TABS
10.0000 mg | ORAL_TABLET | Freq: Three times a day (TID) | ORAL | Status: DC
  Administered 2018-07-01: 10 mg via ORAL
  Filled 2018-07-01: qty 1

## 2018-07-01 MED ORDER — WARFARIN SODIUM 5 MG PO TABS: 5.0000 mg | ORAL_TABLET | Freq: Once | ORAL | Status: DC

## 2018-07-01 MED ORDER — POTASSIUM CHLORIDE CRYS ER 10 MEQ PO TBCR: 10.0000 meq | EXTENDED_RELEASE_TABLET | Freq: Two times a day (BID) | ORAL | Status: DC

## 2018-07-01 MED ORDER — ALPRAZOLAM 0.25 MG PO TABS: 0.2500 mg | ORAL_TABLET | Freq: Every evening | ORAL | 0 refills | Status: DC | PRN

## 2018-07-01 MED ORDER — PREGABALIN 75 MG PO CAPS
75.0000 mg | ORAL_CAPSULE | ORAL | Status: DC
Start: 2018-07-01 — End: 2018-07-03

## 2018-07-01 MED ORDER — HYDRALAZINE HCL 10 MG PO TABS: 10.0000 mg | ORAL_TABLET | Freq: Three times a day (TID) | ORAL | Status: DC

## 2018-07-01 MED ORDER — FUROSEMIDE 40 MG PO TABS: 40.0000 mg | ORAL_TABLET | Freq: Every day | ORAL | Status: DC

## 2018-07-01 NOTE — Progress Notes (Signed)
Patient being discharged, asked nurse did they need echo. Nurse said she didn't think so.

## 2018-07-01 NOTE — Progress Notes (Signed)
Dr. Juanetta GoslingHawkins notified of BP, pt asymptomatic.  No new orders at this time, proceed with d/c to [facility].

## 2018-07-01 NOTE — Progress Notes (Signed)
This is an acute visit.  Facility is [facility]-  Level of care skilled  Chief complaint- acute visit status post hospitalization for CHF exacerbation  History of present illness.  Patient is an 84 year old who actually came to the hospital because of a fall and was having right hip pain.  She had x-rays done that were equivocal so she had a CT scan of her hip which did not show a fracture.  However she was found to have acute on chronic diastolic CHF.  She was treated with Lasix.  She did have physical therapy evaluation because her hip pain was thought she would benefit from rehab at a skilled nursing facility.  She also had elevated blood pressures and adjustments were made he is now on 40 mg a day-as well as hydralazine 10 mg 3 times daily in addition to Coreg 6.25 mg twice daily.-- Per discussion with family apparently at times the hydralazine is held because of lower blood pressures- in fact this afternoon when she came to facility blood pressure was 89/43-manually later I got a systolic varying between 100- 90-- occurring in the hospital as well and hydralazine wouldl be held- her nurse this afternoon did hold the hydralazine she does not appear to be overtly symptomatic   Her other medical history include coronary artery disease with left bundle branch block as well as an aortic valve replacement on chronic Coumadin- also history of hyperlipidemia in addition to hypertension macular degeneration history CVA and history of transient global amnesia-peripheral neuropathy and vitamin D deficiency  She is on low-dose Aricept per discussion with family she was started on this by neurology although per family she does not really have dementia she has occasional confusion  She is bright and alert this afternoon in good humor apparently she was driving before recent falls and actually had worked out in the gym just recently as well   Currently she is lying in bed comfortably at  times will complain of some back pain she does have tramadol as needed.  I also note she has orders for Xanax as needed at night as well as melatonin and trazodone- according to her daughter she does take-Xanax frequently trazodone was just added in the hospital- at this point will hold the trazodone and monitor  Vital signs are stable blood pressure is somewhat low as noted above--she is on chronic oxygen oxygen saturation in the low 90s  Past Medical History:  Diagnosis Date  . Carotid artery stenosis    Without infarction  . Coronary artery disease   . Heart valve replaced by other means   . Hypercholesterolemia    Pure  . Hypertension    Unspecified  . LBBB (left bundle branch block)   . Macular degeneration (senile) of retina, unspecified   . Postsurgical aortocoronary bypass status   . Stroke (HCC)   . Transient global amnesia   . Unspecified hereditary and idiopathic peripheral neuropathy   . Unspecified vitamin D deficiency          Past Surgical History:  Procedure Laterality Date  . AORTIC VALVE REPLACEMENT    . APPENDECTOMY    . CORONARY ARTERY BYPASS GRAFT    . TONSILLECTOMY       reports that she quit smoking about 26 years ago. Her smoking use included cigarettes. She smoked 0.50 packs per day. She has never used smokeless tobacco. She reports that she does not drink alcohol or use drugs.      Allergies  Allergen Reactions  . Codeine   . Penicillins          Family History  Problem Relation Age of Onset  . Heart attack Father   . Stroke Sister   . Neuropathy Brother   . COPD Sister     Medications  acetaminophen 325 MG tablet Commonly known as:  TYLENOL Take 2 tablets (650 mg total) by mouth every 6 (six) hours as needed for mild pain (or Fever >/= 101). Replaces:  TYLENOL 8 HOUR 650 MG CR tablet   ALPRAZolam 0.25 MG tablet Commonly known as:  XANAX Take 1 tablet (0.25 mg total) by mouth at bedtime as  needed for anxiety. What changed:    when to take this  reasons to take this   ANORO ELLIPTA 62.5-25 MCG/INH Aepb Generic drug:  umeclidinium-vilanterol Inhale 1 puff into the lungs every morning.   atorvastatin 40 MG tablet Commonly known as:  LIPITOR Take 1 tablet (40 mg total) by mouth daily. What changed:  when to take this   benazepril 40 MG tablet Commonly known as:  LOTENSIN TAKE (1) TABLET BY MOUTH ONCE DAILY. What changed:  See the new instructions.   carvedilol 6.25 MG tablet Commonly known as:  COREG Take 6.25 mg by mouth 2 (two) times daily with a meal.   donepezil 10 MG tablet Commonly known as:  ARICEPT TAKE ONE TABLET BY MOUTH ONCE DAILY. What changed:  when to take this   ergocalciferol 50000 units capsule Commonly known as:  VITAMIN D2 Take 50,000 Units by mouth every Tuesday.   furosemide 40 MG tablet Commonly known as:  LASIX Take 1 tablet (40 mg total) by mouth daily. What changed:    medication strength  See the new instructions.   hydrALAZINE 10 MG tablet Commonly known as:  APRESOLINE Take 1 tablet (10 mg total) by mouth every 8 (eight) hours.   levalbuterol 0.63 MG/3ML nebulizer solution Commonly known as:  XOPENEX Take 3 mLs (0.63 mg total) by nebulization every 8 (eight) hours as needed for wheezing or shortness of breath.   meclizine 25 MG tablet Commonly known as:  ANTIVERT Take 25 mg by mouth 3 (three) times daily as needed for dizziness.   Melatonin 3 MG Tabs Take 1 tablet (3 mg total) by mouth at bedtime as needed. What changed:    when to take this  reasons to take this   potassium chloride 10 MEQ tablet Commonly known as:  K-DUR,KLOR-CON Take 1 tablet (10 mEq total) by mouth 2 (two) times daily. Replaces:  potassium chloride 10 MEQ tablet   pregabalin 75 MG capsule Commonly known as:  LYRICA Take 1 capsule (75 mg total) by mouth every other day. What changed:    how much to take  when to take  this   traMADol 50 MG tablet Commonly known as:  ULTRAM Take 50 mg by mouth every 12 (twelve) hours as needed for moderate pain or severe pain.   traZODone 50 MG tablet Commonly known as:  DESYREL Take 50 mg by mouth at bedtime as needed for sleep.   warfarin 5 MG tablet Commonly known as:  COUMADIN 2.5 mg every day except Saturday-on Saturdays take 5 mg       Review of systems.  General she is not complaining of any fever or chills.  Skin is not complaining of any rashes or itching.  She does have protective pads on her heels bilaterally  Head ears eyes nose mouth and throat she  has prescription lenses but does not complain of visual changes or sore throat   Respiratory does not complain of shortness of breath or cough she is on chronic oxygen with a history of COPD.  Cardiac is not complaining of any chest pain does not appear to have significant edema  GI is not complaining of abdominal pain nausea vomiting diarrhea constipation says her last bowel movement was about 2 days ago   GU does not complain of dysuria   Musculoskeletal at times will complain of some leg and back pain she does have tramadol as needed   Neurologic is not at this time complaining of dizziness headache or numbness says at times when she gets up when she is been lying for a while she will feel a bit dizzy for a short period of time but this is not new--and I see she does have an order for as needed Antivert  Psych does not complain of being anxious or depressed and she does have an order for Xanax at night needed- does not appear overtly anxious or depressed at this point  Physical exam.  Temperature is 97.7 pulse is 84-respirations of 17 blood pressure 92/44- 100/46 most recently.  Saturation is 90% on 3 L oxygen  In general this is a very pleasant elderly female in no distress.  Her skin is warm and dry she does have protective dressing over her heels bilaterally  Eyes she has  prescription lenses visual acuity appears grossly intact sclera and conjunctive are clear   Oropharynx is clear mucous membranes moist   Chest is clear to auscultation with shallow air entry there is no labored breathing   Heart is regular rate and rhythm with an audible click with history of aortic valve replacement she does not really have significant lower extremity edema-pedal pulses are intact  Abdomen is soft nontender with active bowel sounds.  Musculoskeletal-is able to move all extremities x4-continues to have lower extremity weakness- muscle tone appears appropriate for age  Neurologic is grossly intact her speech is clear no lateralizing findings  Psych she is largely alert and oriented very pleasant and appropriate   Labs.  July 01, 2018.  Sodium 143 potassium 4.4 BUN 36 creatinine 0.99.   INR is 2.29  June 30, 2018.  WBC 9.5 hemoglobin 9.5 platelets 233  10/2018.  Sodium 143 potassium 4.3 BUN 23 creatinine 0.94- liver function test within normal limits   Albumin was 3.5    Assessment and plan.  1.  History of diastolic CHF continues on Lasix with potassium supplementation- will order daily weights notify provider if gain greater than 3 pounds- also will update labs early next week to keep an eye on renal function and electrolytes  #2 hypertension continues on benazepril 40 mg a day it appears hydralazine was added in the hospital at 10 mg 3 times daily and continues on low-dose Coreg twice daily- at times systolic is running somewhat low- have written orders to hold hydralazine for systolic less than 120-and continue to monitor apparently this was done in the hospital as well    #3-history of COPD continues on Anoro Ellipta as well as Xopenex as needed-she is also on chronic oxygen at this point will monitor.--She appears stable at this point  4-- peripheral neuropathy continues on Lyrica at this point appears stable  5 history of aortic valve  replacement she is on chronic Coumadin- per family and review of anticoagulation note goal INR is between 2.2 and 3- currently therapeutic  we will update INR Monday and daily for a few days to ensure stability  6.-  History of anxiety continues on Xanax as needed at night  As noted above will discontinue PRN trazodone apparently this was just recently added in hospital-  does have melatonin to help her sleep at night as well  #7 history of CVA she is on anticoagulation with Coumadin continues on atorvastatin as well--LDL was 62 on lab done in January 2018--- we will update this  #8 history of dementia ??--Family says this is more just intermittent confusion-and was started on Aricept by neurology at this point will monitor  Apparently she was still driving until she had recent falls-and actually had worked out in [employer] as well  9.  History of insomnia she has an order for as needed melatonin  #10 vertigo-I do note she has an order for as needed Antivert-   Again at this point will update labs on Monday as well as a PT/INR- also her weights will have to monitor closely And will have to pay attention to her blood pressure and hold hydralazine if needed.  [id number] note greater than 45 minutes spent assessing patient- reviewing her chart and labs- discussing her status with family at bedside as well as via phone- and coordinating and formulating a plan of care for numerous diagnoses- of note greater than 50% of time spent coordinating a plan of care

## 2018-07-01 NOTE — Progress Notes (Signed)
Subjective: She says she feels well.  No complaints.  She had some hypotension yesterday I think because of the increased dose of hydralazine.  Her weight is approximately the same.  She is not short of breath.  Objective: Vital signs in last 24 hours: Temp:  [97.8 F (36.6 C)-99.1 F (37.3 C)] 97.8 F (36.6 C) (08/17 0523) Pulse Rate:  [63-74] 65 (08/17 0523) Resp:  [16-20] 16 (08/17 0523) BP: (87-169)/(40-92) 169/58 (08/17 0523) SpO2:  [99 %-100 %] 100 % (08/17 0523) Weight:  [46.9 kg] 46.9 kg (08/17 0500) Weight change: 0.4 kg Last BM Date: 06/29/18  Intake/Output from previous day: 08/16 0701 - 08/17 0700 In: 360 [P.O.:360] Out: -   PHYSICAL EXAM General appearance: alert, cooperative and no distress Resp: clear to auscultation bilaterally Cardio: She has a systolic heart murmur and prosthetic heart valve sounds GI: soft, non-tender; bowel sounds normal; no masses,  no organomegaly Extremities: extremities normal, atraumatic, no cyanosis or edema  Lab Results:  Results for orders placed or performed during the hospital encounter of 06/26/18 (from the past 48 hour(s))  Protime-INR     Status: Abnormal   Collection Time: 06/30/18  6:10 AM  Result Value Ref Range   Prothrombin Time 24.1 (H) 11.4 - 15.2 seconds   INR 2.18     Comment: Performed at [facility], [address] [place], [place] [address]  Basic metabolic panel     Status: Abnormal   Collection Time: 06/30/18  6:10 AM  Result Value Ref Range   Sodium 144 135 - 145 mmol/L   Potassium 4.3 3.5 - 5.1 mmol/L   Chloride 105 98 - 111 mmol/L   CO2 34 (H) 22 - 32 mmol/L   Glucose, Bld 95 70 - 99 mg/dL   BUN 35 (H) 8 - 23 mg/dL   Creatinine, Ser 1.01 (H) 0.44 - 1.00 mg/dL   Calcium 8.8 (L) 8.9 - 10.3 mg/dL   GFR calc non Af Amer 48 (L) >60 mL/min   GFR calc Af Amer 55 (L) >60 mL/min    Comment: (NOTE) The eGFR has been calculated using the CKD EPI equation. This calculation has not been validated in all  clinical situations. eGFR's persistently <60 mL/min signify possible Chronic Kidney Disease.    Anion gap 5 5 - 15    Comment: Performed at [facility], [address], [place], [place] [address]  CBC     Status: Abnormal   Collection Time: 06/30/18  6:10 AM  Result Value Ref Range   WBC 9.5 4.0 - 10.5 K/uL   RBC 3.17 (L) 3.87 - 5.11 MIL/uL   Hemoglobin 9.5 (L) 12.0 - 15.0 g/dL   HCT 31.3 (L) 36.0 - 46.0 %   MCV 98.7 78.0 - 100.0 fL   MCH 30.0 26.0 - 34.0 pg   MCHC 30.4 30.0 - 36.0 g/dL   RDW 14.2 11.5 - 15.5 %   Platelets 233 150 - 400 K/uL    Comment: Performed at [facility], [address] [place], [place] [address]  Protime-INR     Status: Abnormal   Collection Time: 07/01/18  7:02 AM  Result Value Ref Range   Prothrombin Time 25.0 (H) 11.4 - 15.2 seconds   INR 2.29     Comment: Performed at [facility], [address] [place], [place] [address]  Basic metabolic panel     Status: Abnormal   Collection Time: 07/01/18  7:02 AM  Result Value Ref Range   Sodium 143  135 - 145 mmol/L   Potassium 4.4 3.5 - 5.1 mmol/L   Chloride 104 98 - 111 mmol/L   CO2 30 22 - 32 mmol/L   Glucose, Bld 91 70 - 99 mg/dL   BUN 36 (H) 8 - 23 mg/dL   Creatinine, Ser 0.99 0.44 - 1.00 mg/dL   Calcium 9.0 8.9 - 10.3 mg/dL   GFR calc non Af Amer 49 (L) >60 mL/min   GFR calc Af Amer 57 (L) >60 mL/min    Comment: (NOTE) The eGFR has been calculated using the CKD EPI equation. This calculation has not been validated in all clinical situations. eGFR's persistently <60 mL/min signify possible Chronic Kidney Disease.    Anion gap 9 5 - 15    Comment: Performed at [facility], [address] [place], [place] [address]    ABGS No results for input(s): PHART, PO2ART, TCO2, HCO3 in the last 72 hours.  Invalid input(s): PCO2 CULTURES Recent Results (from the past 240 hour(s))  MRSA PCR Screening     Status: None   Collection Time: 06/27/18  1:52 AM  Result Value Ref Range Status   MRSA by PCR  NEGATIVE NEGATIVE Final    Comment:        The GeneXpert MRSA Assay (FDA approved for NASAL specimens only), is one component of a comprehensive MRSA colonization surveillance program. It is not intended to diagnose MRSA infection nor to guide or monitor treatment for MRSA infections. Performed at [facility], [address] [place], [place] [address]    Studies/Results: No results found.  Medications:  Prior to Admission:  Medications Prior to Admission  Medication Sig Dispense Refill Last Dose  . acetaminophen (TYLENOL 8 HOUR) 650 MG CR tablet Take 650 mg by mouth 2 (two) times daily.   06/26/2018 at Unknown time  . aspirin 81 MG EC tablet Take 81 mg by mouth every evening.    06/25/2018 at Unknown time  . atorvastatin (LIPITOR) 40 MG tablet Take 1 tablet (40 mg total) by mouth daily. (Patient taking differently: Take 40 mg by mouth every morning. ) 90 tablet 3 06/26/2018 at Unknown time  . baclofen (LIORESAL) 10 MG tablet Take 10 mg by mouth 2 (two) times daily.   06/26/2018 at Unknown time  . benazepril (LOTENSIN) 40 MG tablet TAKE (1) TABLET BY MOUTH ONCE DAILY. (Patient taking differently: Take 40 mg by mouth every morning. ) 90 tablet 1 06/26/2018 at Unknown time  . carvedilol (COREG) 6.25 MG tablet Take 6.25 mg by mouth 2 (two) times daily with a meal.   06/26/2018 at 9-930a  . donepezil (ARICEPT) 10 MG tablet TAKE ONE TABLET BY MOUTH ONCE DAILY. (Patient taking differently: Take 10 mg by mouth at bedtime. ) 30 tablet 0 06/25/2018 at Unknown time  . ergocalciferol (VITAMIN D2) 50000 UNITS capsule Take 50,000 Units by mouth every Tuesday.    Past Week at Unknown time  . ferrous sulfate 325 (65 FE) MG EC tablet Take 325 mg by mouth as needed.    Past Month at Unknown time  . furosemide (LASIX) 20 MG tablet TAKE (1) TABLET BY MOUTH ONCE DAILY. (Patient taking differently: Take 20 mg by mouth every morning. ) 30 tablet 5 06/26/2018 at Unknown time  . LYRICA 75 MG capsule TAKE ONE CAPSULE  BY MOUTH ONCE DAILY. (Patient taking differently: Take 75 mg by mouth every other day. AT BEDTIME) 30 capsule 0 06/25/2018 at Unknown time  . meclizine (ANTIVERT) 25 MG tablet Take  25 mg by mouth 3 (three) times daily as needed for dizziness.   UNKNOWN  . Multiple Vitamins-Minerals (VITEYES AREDS FORMULA PO) Take 1 tablet by mouth daily.   Past Month at Unknown time  . potassium chloride (K-DUR) 10 MEQ tablet TAKE ONE TABLET BY MOUTH ONCE DAILY. (Patient taking differently: Take 10 mEq by mouth every morning. ) 30 tablet 6 06/26/2018 at Unknown time  . traMADol (ULTRAM) 50 MG tablet Take 50 mg by mouth every 12 (twelve) hours as needed for moderate pain or severe pain.    UNKNOWN  . traZODone (DESYREL) 50 MG tablet Take 50 mg by mouth at bedtime as needed for sleep.    UNKNOWN  . umeclidinium-vilanterol (ANORO ELLIPTA) 62.5-25 MCG/INH AEPB Inhale 1 puff into the lungs every morning.   06/26/2018 at Unknown time  . warfarin (COUMADIN) 5 MG tablet TAKE 1/2 TO 1 TABLET DAILY AS DIRECTED BY COUMADIN CLINIC. (Patient taking differently: Take 2.5-5 mg by mouth See admin instructions. 2.5MG ON ALL DAYS EXCEPT 5MG ON SATURDAYS ONLY. TAKES AT SUPPER) 30 tablet 3 06/25/2018 at 1700  . [DISCONTINUED] ALPRAZolam (XANAX) 0.25 MG tablet Take 0.25 mg by mouth at bedtime.    06/25/2018 at Unknown time  . [DISCONTINUED] Melatonin 3 MG TABS Take 3 mg by mouth at bedtime.     06/25/2018 at Unknown time   Scheduled: . ALPRAZolam  0.25 mg Oral QHS  . aspirin EC  81 mg Oral QPM  . atorvastatin  40 mg Oral q morning - 10a  . baclofen  10 mg Oral BID  . benazepril  40 mg Oral q morning - 10a  . carvedilol  6.25 mg Oral BID WC  . donepezil  10 mg Oral QHS  . furosemide  40 mg Oral Daily  . hydrALAZINE  10 mg Oral Q8H  . potassium chloride  10 mEq Oral BID  . pregabalin  75 mg Oral QODAY  . Vitamin D (Ergocalciferol)  50,000 Units Oral Q Tue  . Warfarin - Pharmacist Dosing Inpatient   Does not apply q1800    Continuous:  APO:[id number] **OR** acetaminophen, levalbuterol, ondansetron **OR** ondansetron (ZOFRAN) IV, traZODone  Assesment: She was admitted with acute diastolic heart failure and with pain in her right hip.  She did not have a fracture.  She had fallen.  It is felt that she would benefit from being in a skilled care facility and that is being arranged.  She has no new complaints now. Principal Problem:   Acute diastolic heart failure (HCC) Active Problems:   Hyperlipidemia   Essential hypertension   CAD, ARTERY BYPASS GRAFT   S/P AVR (aortic valve replacement)   Dementia   Anemia   CHF (congestive heart failure) ([place])    Plan: Transfer to skilled care facility today    LOS: 3 days   Khushbu Pippen L 07/01/2018, 10:10 AM

## 2018-07-01 NOTE — Progress Notes (Signed)
Patient will Discharge ZO[id number] Anticipated DC Date:07/01/18 Family Notified:yes Transport EA:[id number]:Nursing staff   Per MD patient ready for DC to [facility] . RN, patient, patient's family, and facility notified of DC. Assessment, Fl2/Pasrr, and Discharge Summary sent to facility. RN given number for report [phone]). DC packet on chart. Ambulance transport requested for patient.   CSW signing off.  Budd Palmerara Loletta Harper LCSWA [phone]

## 2018-07-01 NOTE — Discharge Summary (Addendum)
Physician Discharge Summary  Patient ID: [name] MRN: [id number] DOB/AGE: [birth year] 84 y.o. Primary Care Physician:Abednego Yeates, Ramon Dredge, MD Admit date: 06/26/2018 Discharge date: 07/01/2018    Discharge Diagnoses:   Principal Problem:   Acute diastolic heart failure (HCC) Active Problems:   Hyperlipidemia   Essential hypertension   CAD, ARTERY BYPASS GRAFT   S/P AVR (aortic valve replacement)   Dementia   Mild malnutrition (HCC)   Anemia   CHF (congestive heart failure) (HCC)   COPD exacerbation (HCC)   Pressure injury of skin of heel Acute on chronic diastolic heart failure Chronic hypoxic respiratory failure on home oxygen Allergies as of 07/01/2018      Reactions   Codeine    Penicillins       Medication List    STOP taking these medications   aspirin 81 MG EC tablet   baclofen 10 MG tablet Commonly known as:  LIORESAL   ferrous sulfate 325 (65 FE) MG EC tablet   potassium chloride 10 MEQ tablet Commonly known as:  K-DUR Replaced by:  potassium chloride 10 MEQ tablet   TYLENOL 8 HOUR 650 MG CR tablet Generic drug:  acetaminophen Replaced by:  acetaminophen 325 MG tablet   VITEYES AREDS FORMULA PO     TAKE these medications   acetaminophen 325 MG tablet Commonly known as:  TYLENOL Take 2 tablets (650 mg total) by mouth every 6 (six) hours as needed for mild pain (or Fever >/= 101). Replaces:  TYLENOL 8 HOUR 650 MG CR tablet   ALPRAZolam 0.25 MG tablet Commonly known as:  XANAX Take 1 tablet (0.25 mg total) by mouth at bedtime as needed for anxiety. What changed:    when to take this  reasons to take this   ANORO ELLIPTA 62.5-25 MCG/INH Aepb Generic drug:  umeclidinium-vilanterol Inhale 1 puff into the lungs every morning.   atorvastatin 40 MG tablet Commonly known as:  LIPITOR Take 1 tablet (40 mg total) by mouth daily. What changed:  when to take this   benazepril 40 MG tablet Commonly known as:  LOTENSIN TAKE (1) TABLET BY MOUTH  ONCE DAILY. What changed:  See the new instructions.   carvedilol 6.25 MG tablet Commonly known as:  COREG Take 6.25 mg by mouth 2 (two) times daily with a meal.   donepezil 10 MG tablet Commonly known as:  ARICEPT TAKE ONE TABLET BY MOUTH ONCE DAILY. What changed:  when to take this   ergocalciferol 50000 units capsule Commonly known as:  VITAMIN D2 Take 50,000 Units by mouth every Tuesday.   furosemide 40 MG tablet Commonly known as:  LASIX Take 1 tablet (40 mg total) by mouth daily. What changed:    medication strength  See the new instructions.   hydrALAZINE 10 MG tablet Commonly known as:  APRESOLINE Take 1 tablet (10 mg total) by mouth every 8 (eight) hours.   levalbuterol 0.63 MG/3ML nebulizer solution Commonly known as:  XOPENEX Take 3 mLs (0.63 mg total) by nebulization every 8 (eight) hours as needed for wheezing or shortness of breath.   meclizine 25 MG tablet Commonly known as:  ANTIVERT Take 25 mg by mouth 3 (three) times daily as needed for dizziness.   Melatonin 3 MG Tabs Take 1 tablet (3 mg total) by mouth at bedtime as needed. What changed:    when to take this  reasons to take this   potassium chloride 10 MEQ tablet Commonly known as:  K-DUR,KLOR-CON Take 1 tablet (10 mEq  total) by mouth 2 (two) times daily. Replaces:  potassium chloride 10 MEQ tablet   pregabalin 75 MG capsule Commonly known as:  LYRICA Take 1 capsule (75 mg total) by mouth every other day. What changed:    how much to take  when to take this   traMADol 50 MG tablet Commonly known as:  ULTRAM Take 50 mg by mouth every 12 (twelve) hours as needed for moderate pain or severe pain.   traZODone 50 MG tablet Commonly known as:  DESYREL Take 50 mg by mouth at bedtime as needed for sleep.   warfarin 5 MG tablet Commonly known as:  COUMADIN Take as directed. If you are unsure how to take this medication, talk to your nurse or doctor. Original instructions:  TAKE 1/2 TO  1 TABLET DAILY AS DIRECTED BY COUMADIN CLINIC. What changed:  See the new instructions.       Discharged Condition: Improved    Consults: Cardiology, Dr. Purvis SheffieldKoneswaran  Significant Diagnostic Studies: Dg Chest 2 View  Result Date: 06/26/2018 CLINICAL DATA:  Weakness, right hip pain EXAM: CHEST - 2 VIEW COMPARISON:  03/10/2017, CT chest 03/28/2018 FINDINGS: Sternotomy changes. Patient is rotated. Small bilateral pleural effusions. Hazy airspace disease at the bases. Cardiomegaly with vascular congestion and mild diffuse interstitial opacity consistent with edema. Aortic atherosclerosis. No pneumothorax. Old right-sided rib deformities. Emphysematous disease. IMPRESSION: 1. Cardiomegaly with vascular congestion, mild interstitial pulmonary edema, and small bilateral pleural effusions. 2. Hazy airspace disease at the bases may reflect atelectasis or edema. Electronically Signed   By: Jasmine PangKim  Fujinaga M.D.   On: 06/26/2018 20:11   Ct Hip Right Wo Contrast  Result Date: 06/26/2018 CLINICAL DATA:  Right hip pain and shortness of breath. Abnormal radiographs of right hip. EXAM: CT OF THE RIGHT HIP WITHOUT CONTRAST TECHNIQUE: Multidetector CT imaging of the right hip was performed according to the standard protocol. Multiplanar CT image reconstructions were also generated. COMPARISON:  Right hip radiographs 06/26/2018 FINDINGS: Bones/Joint/Cartilage Degenerative changes in the right hip with osteophytes on the acetabular and femoral surfaces. No evidence of acute fracture or dislocation of the right hip. Plain radiographic finding of cortical step-off likely represented osteophyte formation. Old healed fracture deformities are demonstrated in the right superior and inferior pubic rami. There is a small right hip joint effusion. Ligaments Suboptimally assessed by CT. Muscles and Tendons Mild muscle atrophy. No intramuscular mass or hematoma suggested. Soft tissues The bladder is distended, possibly physiologic  or possibly due to urinary retention. Vascular calcifications are present. There is a small right inguinal hernia containing small bowel without apparent proximal obstruction. IMPRESSION: No acute fracture or dislocation of the right hip. Degenerative changes in the right hip. Small right hip joint effusion. Small right inguinal hernia containing small bowel without apparent proximal obstruction. Electronically Signed   By: Burman NievesWilliam  Stevens M.D.   On: 06/26/2018 23:56   Dg Hip Unilat With Pelvis 2-3 Views Right  Result Date: 06/26/2018 CLINICAL DATA:  Right hip and groin pain after a fall EXAM: DG HIP (WITH OR WITHOUT PELVIS) 2-3V RIGHT COMPARISON:  None. FINDINGS: Limited evaluation of the right femoral neck due to superimposition of trochanter. Possible step-off deformity at the right femoral head neck junction. Old appearing fracture deformity of the right superior and inferior pubic rami. IMPRESSION: 1. Limited evaluation of femoral neck due to positioning, questionable step-off deformity at the right femoral head neck junction, suggest CT to better evaluate. 2. Old appearing fractures of the right superior and inferior  pubic rami. Electronically Signed   By: Jasmine Pang M.D.   On: 06/26/2018 20:13    Lab Results: Basic Metabolic Panel: Recent Labs    06/30/18 0610 07/01/18 0702  NA 144 143  K 4.3 4.4  CL 105 104  CO2 34* 30  GLUCOSE 95 91  BUN 35* 36*  CREATININE 1.01* 0.99  CALCIUM 8.8* 9.0   Liver Function Tests: No results for input(s): AST, ALT, ALKPHOS, BILITOT, PROT, ALBUMIN in the last 72 hours.   CBC: Recent Labs    06/30/18 0610  WBC 9.5  HGB 9.5*  HCT 31.3*  MCV 98.7  PLT 233    Recent Results (from the past 240 hour(s))  MRSA PCR Screening     Status: None   Collection Time: 06/27/18  1:52 AM  Result Value Ref Range Status   MRSA by PCR NEGATIVE NEGATIVE Final    Comment:        The GeneXpert MRSA Assay (FDA approved for NASAL specimens only), is one  component of a comprehensive MRSA colonization surveillance program. It is not intended to diagnose MRSA infection nor to guide or monitor treatment for MRSA infections. Performed at [facility], [address], [place], [place] [address]      Hospital Course: This is an 84 year old who came to the hospital because of a fall.  She been having trouble with pain in her right hip for about 3 or 4 days.  Her hip pain was worse.  She was brought to the emergency department and had x-rays done which were equivocal so she had a CT which did not show a fracture.  She was found to have acute on chronic diastolic heart failure And was treated with Lasix.  She was hypoxic but has chronic hypoxic respiratory failure.  She was given diuretics.  She had PT evaluation because of her hip pain and it was felt that she would benefit from rehab at a skilled care facility.  Her blood pressure was up some so adjustments were made in her blood pressure medication.  She was found to have an unstageable left heel pressure injury of the skin and she was given heel protectors.  Her breathing is better.  She generally looks better.  She is ready for transfer to skilled care facility to begin her rehab Discharge Exam: Blood pressure (!) 169/58, pulse 65, temperature 97.8 F (36.6 C), resp. rate 16, height 5\' 6"  (1.676 m), weight 46.9 kg, SpO2 100 %. She is awake and alert.  She has a systolic heart murmur.  Chest is clear.  Disposition: To skilled care facility for rehab.  She will be on a heart healthy diet with 1200 cc fluid restriction.  She will be on nasal oxygen at 3 L and this will be adjusted depending on her shortness of breath and O2 saturation.  She needs to have PT/INR daily x5 days starting on 819 and this should be called to the [facility] health medical group cardiology warfarin clinic at the [facility] office.  She should have daily weights.  Blood pressure needs to be monitored.  She will have PT OT and speech as  needed.  She should follow-up in my office in about 2 weeks   Greater than 30 minutes were spent on this discharge activity  Contact information for follow-up providers    Lewayne Bunting, MD Follow up on 08/08/2018.   Specialty:  Cardiology Why:  Cardiology Follow-Up on 08/08/2018 at 9:20AM.  Contact information: [address] [place] [id number] [phone]            Contact information for after-discharge care    Destination    [facility] Preferred SNF .   Service:  Skilled Nursing Contact information: [address] [place] [id number] [phone]                  Signed: Fredirick MaudlinHAWKINS,Jayci Ellefson L   07/01/2018, 10:14 AM

## 2018-07-02 ENCOUNTER — Emergency Department (HOSPITAL_COMMUNITY)
Admission: EM | Admit: 2018-07-02 | Discharge: 2018-07-03 | Disposition: A | Discharge status: Skilled Nursing Facility | Payer: Medicare Other | Attending: Emergency Medicine | Admitting: Emergency Medicine

## 2018-07-02 ENCOUNTER — Emergency Department (HOSPITAL_COMMUNITY): Payer: Medicare Other

## 2018-07-02 ENCOUNTER — Other Ambulatory Visit: Payer: Self-pay

## 2018-07-02 ENCOUNTER — Encounter (HOSPITAL_COMMUNITY): Payer: Self-pay

## 2018-07-02 DIAGNOSIS — I509 Heart failure, unspecified: Secondary | ICD-10-CM | POA: Insufficient documentation

## 2018-07-02 DIAGNOSIS — F0390 Unspecified dementia without behavioral disturbance: Secondary | ICD-10-CM | POA: Insufficient documentation

## 2018-07-02 DIAGNOSIS — I259 Chronic ischemic heart disease, unspecified: Secondary | ICD-10-CM | POA: Diagnosis not present

## 2018-07-02 DIAGNOSIS — Z7901 Long term (current) use of anticoagulants: Secondary | ICD-10-CM | POA: Insufficient documentation

## 2018-07-02 DIAGNOSIS — J449 Chronic obstructive pulmonary disease, unspecified: Secondary | ICD-10-CM | POA: Diagnosis not present

## 2018-07-02 DIAGNOSIS — R112 Nausea with vomiting, unspecified: Secondary | ICD-10-CM

## 2018-07-02 DIAGNOSIS — Z87891 Personal history of nicotine dependence: Secondary | ICD-10-CM | POA: Insufficient documentation

## 2018-07-02 DIAGNOSIS — I110 Hypertensive heart disease with heart failure: Secondary | ICD-10-CM | POA: Insufficient documentation

## 2018-07-02 DIAGNOSIS — Z79899 Other long term (current) drug therapy: Secondary | ICD-10-CM | POA: Insufficient documentation

## 2018-07-02 LAB — CBC WITH DIFFERENTIAL/PLATELET
BASOS ABS: 0 10*3/uL (ref 0.0–0.1)
Basophils Relative: 0 %
Eosinophils Absolute: 0.2 10*3/uL (ref 0.0–0.7)
Eosinophils Relative: 2 %
HEMATOCRIT: 30.5 % — AB (ref 36.0–46.0)
HEMOGLOBIN: 9.3 g/dL — AB (ref 12.0–15.0)
LYMPHS ABS: 1.3 10*3/uL (ref 0.7–4.0)
LYMPHS PCT: 13 %
MCH: 30 pg (ref 26.0–34.0)
MCHC: 30.5 g/dL (ref 30.0–36.0)
MCV: 98.4 fL (ref 78.0–100.0)
Monocytes Absolute: 0.7 10*3/uL (ref 0.1–1.0)
Monocytes Relative: 7 %
NEUTROS ABS: 7.8 10*3/uL — AB (ref 1.7–7.7)
NEUTROS PCT: 78 %
Platelets: 267 10*3/uL (ref 150–400)
RBC: 3.1 MIL/uL — AB (ref 3.87–5.11)
RDW: 14.1 % (ref 11.5–15.5)
WBC: 10 10*3/uL (ref 4.0–10.5)

## 2018-07-02 LAB — COMPREHENSIVE METABOLIC PANEL
ALBUMIN: 3.2 g/dL — AB (ref 3.5–5.0)
ALK PHOS: 70 U/L (ref 38–126)
ALT: 19 U/L (ref 0–44)
ANION GAP: 8 (ref 5–15)
AST: 26 U/L (ref 15–41)
BUN: 33 mg/dL — ABNORMAL HIGH (ref 8–23)
CHLORIDE: 102 mmol/L (ref 98–111)
CO2: 31 mmol/L (ref 22–32)
Calcium: 8.8 mg/dL — ABNORMAL LOW (ref 8.9–10.3)
Creatinine, Ser: 1.03 mg/dL — ABNORMAL HIGH (ref 0.44–1.00)
GFR calc Af Amer: 54 mL/min — ABNORMAL LOW (ref >60)
GFR calc non Af Amer: 47 mL/min — ABNORMAL LOW (ref >60)
GLUCOSE: 111 mg/dL — AB (ref 70–99)
POTASSIUM: 4.1 mmol/L (ref 3.5–5.1)
SODIUM: 141 mmol/L (ref 135–145)
Total Bilirubin: 0.6 mg/dL (ref 0.3–1.2)
Total Protein: 6.4 g/dL — ABNORMAL LOW (ref 6.5–8.1)

## 2018-07-02 LAB — URINALYSIS, ROUTINE W REFLEX MICROSCOPIC
Bilirubin Urine: NEGATIVE
GLUCOSE, UA: NEGATIVE mg/dL
HGB URINE DIPSTICK: NEGATIVE
Ketones, ur: NEGATIVE mg/dL
LEUKOCYTES UA: NEGATIVE
Nitrite: NEGATIVE
Protein, ur: NEGATIVE mg/dL
SPECIFIC GRAVITY, URINE: 1.009 (ref 1.005–1.030)
pH: 5 (ref 5.0–8.0)

## 2018-07-02 LAB — PROTIME-INR
INR: 2.61
PROTHROMBIN TIME: 27.7 s — AB (ref 11.4–15.2)

## 2018-07-02 LAB — TROPONIN I: Troponin I: 0.04 ng/mL (ref <0.03)

## 2018-07-02 LAB — LIPASE, BLOOD: Lipase: 59 U/L — ABNORMAL HIGH (ref 11–51)

## 2018-07-02 MED ORDER — ONDANSETRON 4 MG PO TBDP: 4.0000 mg | ORAL_TABLET | Freq: Three times a day (TID) | ORAL | 0 refills | Status: DC | PRN

## 2018-07-02 NOTE — ED Notes (Signed)
Date and time results received: 07/02/18 1234 (use smartphrase ".now" to insert current time)  Test: troponin  Critical Value: 0.04  Name of Provider Notified: Clarene DukeMcManus MD  Orders Received? Or Actions Taken?: n/a

## 2018-07-02 NOTE — Discharge Instructions (Addendum)
Take the prescription as directed. Eat a bland diet and advance to your regular diet slowly as you can tolerate it.  Call your regular medical doctor Monday to schedule a follow up appointment in the next 2 days.  Return to the Emergency Department immediately sooner if worsening.

## 2018-07-02 NOTE — ED Provider Notes (Signed)
[facility] EM[facility]GENCY DEPARTMENT Provider Note   CSN: [id number] Arrival date & time: 07/02/18  1051     History   Chief Complaint Chief Complaint  Patient presents with  . Emesis    HPI [name] is a 84 y.o. female.  HPI Pt was seen at 1150. Per NH report, pt's family and pt:  c/o gradual onset and persistence of several intermittent episodes of N/V that began yesterday morning. Denies diarrhea and describes her stools as "soft." Denies abd pain, no CP/SOB, no cough, no back pain, no fevers, no black or blood in stools or emesis.    Past Medical History:  Diagnosis Date  . Carotid artery stenosis    Without infarction  . Coronary artery disease   . Heart valve replaced by other means   . Hypercholesterolemia    Pure  . Hypertension    Unspecified  . LBBB (left bundle branch block)   . Macular degeneration (senile) of retina, unspecified   . Postsurgical aortocoronary bypass status   . Stroke (HCC)   . Transient global amnesia   . Unspecified hereditary and idiopathic peripheral neuropathy   . Unspecified vitamin D deficiency     Patient Active Problem List   Diagnosis Date Noted  . COPD exacerbation (HCC) 07/01/2018  . Pressure injury of skin of heel 07/01/2018  . Right hip pain 07/01/2018  . Chronic respiratory failure with hypoxia, on home O2 therapy (HCC) 07/01/2018  . CHF (congestive heart failure) (HCC) 06/28/2018  . Acute diastolic heart failure (HCC) 06/27/2018  . Anemia 06/27/2018  . Congestive dilated cardiomyopathy (HCC) 09/29/2015  . Encounter for therapeutic drug monitoring 12/12/2013  . Memory loss 04/16/2013  . Transient confusion 04/16/2013  . Mild malnutrition (HCC) 11/28/2012  . Dehydration 11/27/2012  . Dementia 11/27/2012  . Pneumonia 11/24/2012  . Aortic valve disorders 01/13/2011  . Carotid artery stenosis 10/13/2009  . S/P AVR (aortic valve replacement) 10/13/2009  . Hyperlipidemia 03/06/2009  . Essential hypertension  03/06/2009  . CAD, ARTERY BYPASS GRAFT 03/06/2009  . LBBB 03/06/2009  . CVA 03/06/2009    Past Surgical History:  Procedure Laterality Date  . AORTIC VALVE REPLACEMENT    . APPENDECTOMY    . CORONARY ARTERY BYPASS GRAFT    . TONSILLECTOMY       OB History   None      Home Medications    Prior to Admission medications   Medication Sig Start Date End Date Taking? Authorizing Provider  acetaminophen (TYLENOL) 325 MG tablet Take 2 tablets (650 mg total) by mouth every 6 (six) hours as needed for mild pain (or Fever >/= 101). 07/01/18   Kari BaarsHawkins, Edward, MD  ALPRAZolam Prudy Feeler(XANAX) 0.25 MG tablet Take 1 tablet (0.25 mg total) by mouth at bedtime as needed for anxiety. 07/01/18   Kari BaarsHawkins, Edward, MD  atorvastatin (LIPITOR) 40 MG tablet Take 1 tablet (40 mg total) by mouth daily. Patient taking differently: Take 40 mg by mouth every morning.  02/14/18   Lewayne Buntingrenshaw, Brian S, MD  benazepril (LOTENSIN) 40 MG tablet TAKE (1) TABLET BY MOUTH ONCE DAILY. Patient taking differently: Take 40 mg by mouth every morning.  03/07/18   Lewayne Buntingrenshaw, Brian S, MD  carvedilol (COREG) 6.25 MG tablet Take 6.25 mg by mouth 2 (two) times daily with a meal.    [provider]  donepezil (ARICEPT) 10 MG tablet TAKE ONE TABLET BY MOUTH ONCE DAILY. Patient taking differently: Take 10 mg by mouth at bedtime.  08/12/15  Penumalli, Glenford Bayley, MD  ergocalciferol (VITAMIN D2) 50000 UNITS capsule Take 50,000 Units by mouth every Tuesday.     [provider]  furosemide (LASIX) 40 MG tablet Take 1 tablet (40 mg total) by mouth daily. 07/01/18   Kari Baars, MD  hydrALAZINE (APRESOLINE) 10 MG tablet Take 1 tablet (10 mg total) by mouth every 8 (eight) hours. 07/01/18   Kari Baars, MD  levalbuterol Pauline Aus) 0.63 MG/3ML nebulizer solution Take 3 mLs (0.63 mg total) by nebulization every 8 (eight) hours as needed for wheezing or shortness of breath. 07/01/18   Kari Baars, MD  meclizine (ANTIVERT) 25 MG tablet  Take 25 mg by mouth 3 (three) times daily as needed for dizziness.    [provider]  Melatonin 3 MG TABS Take 1 tablet (3 mg total) by mouth at bedtime as needed. 07/01/18   Kari Baars, MD  potassium chloride (K-DUR,KLOR-CON) 10 MEQ tablet Take 1 tablet (10 mEq total) by mouth 2 (two) times daily. 07/01/18   Kari Baars, MD  pregabalin (LYRICA) 75 MG capsule Take 1 capsule (75 mg total) by mouth every other day. 07/01/18   Kari Baars, MD  traMADol (ULTRAM) 50 MG tablet Take 50 mg by mouth every 12 (twelve) hours as needed for moderate pain or severe pain.  03/28/13   [provider]  traZODone (DESYREL) 50 MG tablet Take 50 mg by mouth at bedtime as needed for sleep.  11/29/12   [provider]  umeclidinium-vilanterol (ANORO ELLIPTA) 62.5-25 MCG/INH AEPB Inhale 1 puff into the lungs every morning.    [provider]  warfarin (COUMADIN) 5 MG tablet TAKE 1/2 TO 1 TABLET DAILY AS DIRECTED BY COUMADIN CLINIC. Patient taking differently: Take 2.5-5 mg by mouth See admin instructions. 2.5MG  ON ALL DAYS EXCEPT 5MG  ON SATURDAYS ONLY. TAKES AT SUPPER 05/15/18   Lewayne Bunting, MD    Family History Family History  Problem Relation Age of Onset  . Heart attack Father   . Stroke Sister   . Neuropathy Brother   . COPD Sister     Social History Social History   Tobacco Use  . Smoking status: Former Smoker    Packs/day: 0.50    Types: Cigarettes    Last attempt to quit: 11/30/1991    Years since quitting: 26.6  . Smokeless tobacco: Never Used  . Tobacco comment: Tobacco use-no  Substance Use Topics  . Alcohol use: No  . Drug use: No     Allergies   Codeine and Penicillins   Review of Systems Review of Systems ROS: Statement: All systems negative except as marked or noted in the HPI; Constitutional: Negative for fever and chills. ; ; Eyes: Negative for eye pain, redness and discharge. ; ; ENMT: Negative for ear pain, hoarseness, nasal  congestion, sinus pressure and sore throat. ; ; Cardiovascular: Negative for chest pain, palpitations, diaphoresis, dyspnea and peripheral edema. ; ; Respiratory: Negative for cough, wheezing and stridor. ; ; Gastrointestinal: +N/V. Negative for diarrhea, abdominal pain, blood in stool, hematemesis, jaundice and rectal bleeding. . ; ; Genitourinary: Negative for dysuria, flank pain and hematuria. ; ; Musculoskeletal: Negative for back pain and neck pain. Negative for swelling and trauma.; ; Skin: Negative for pruritus, rash, abrasions, blisters, bruising and skin lesion.; ; Neuro: Negative for headache, lightheadedness and neck stiffness. Negative for weakness, altered level of consciousness, altered mental status, extremity weakness, paresthesias, involuntary movement, seizure and syncope.       Physical Exam Updated  Vital Signs BP 138/65   Pulse (!) 54   Temp 97.8 F (36.6 C) (Oral)   Resp (!) 28   Ht 5' 2.5" (1.588 m)   Wt 46.7 kg   SpO2 99%   BMI 18.54 kg/m     12:00:48 Orthostatic Vital Signs MM  Orthostatic Lying   BP- Lying: 150/45   Pulse- Lying: 56       Orthostatic Sitting  BP- Sitting: 154/62   Pulse- Sitting: 63       Orthostatic Standing at 0 minutes  BP- Standing at 0 minutes: 135/74   Pulse- Standing at 0 minutes: 73      Physical Exam 1155: Physical examination:  Nursing notes reviewed; Vital signs and O2 SAT reviewed;  Constitutional: Well developed, Well nourished, Well hydrated, In no acute distress; Head:  Normocephalic, atraumatic; Eyes: EOMI, PERRL, No scleral icterus; ENMT: Mouth and pharynx normal, Mucous membranes moist; Neck: Supple, Full range of motion, No lymphadenopathy; Cardiovascular: Regular rate and rhythm, No gallop; Respiratory: Breath sounds clear & equal bilaterally, No wheezes.  Speaking full sentences with ease, Normal respiratory effort/excursion; Chest: Nontender, Movement normal; Abdomen: Soft, Nontender, Nondistended, Normal bowel  sounds; Genitourinary: No CVA tenderness; Extremities: Peripheral pulses normal, No tenderness, No edema, No calf edema or asymmetry.; Neuro: AA&Ox3, Major CN grossly intact. No facial droop. Speech clear. No gross focal motor or sensory deficits in extremities.; Skin: Color normal, Warm, Dry.   ED Treatments / Results  Labs (all labs ordered are listed, but only abnormal results are displayed)   EKG EKG Interpretation  Date/Time:  Sunday July 02 2018 11:59:51 EDT Ventricular Rate:  57 PR Interval:    QRS Duration: 173 QT Interval:  507 QTC Calculation: 494 R Axis:   106 Text Interpretation:  Sinus rhythm Nonspecific intraventricular conduction delay Repol abnrm, global ischemia, diffuse leads Baseline wander Artifact When compared with ECG of 06/26/2018 No significant change was found Confirmed by Samuel JesterMcManus, Gavrielle Streck [phone]([id number]) on 07/02/2018 12:32:48 PM   Radiology   Procedures Procedures (including critical care time)  Medications Ordered in ED Medications - No data to display   Initial Impression / Assessment and Plan / ED Course  I have reviewed the triage vital signs and the nursing notes.  Pertinent labs & imaging results that were available during my care of the patient were reviewed by me and considered in my medical decision making (see chart for details).  MDM Reviewed: previous chart, nursing note and vitals Reviewed previous: labs and ECG Interpretation: labs, ECG and x-ray    Results for orders placed or performed during the hospital encounter of 07/02/18  Comprehensive metabolic panel  Result Value Ref Range   Sodium 141 135 - 145 mmol/L   Potassium 4.1 3.5 - 5.1 mmol/L   Chloride 102 98 - 111 mmol/L   CO2 31 22 - 32 mmol/L   Glucose, Bld 111 (H) 70 - 99 mg/dL   BUN 33 (H) 8 - 23 mg/dL   Creatinine, Ser 6.041.03 (H) 0.44 - 1.00 mg/dL   Calcium 8.8 (L) 8.9 - 10.3 mg/dL   Total Protein 6.4 (L) 6.5 - 8.1 g/dL   Albumin 3.2 (L) 3.5 - 5.0 g/dL   AST 26 15 - 41  U/L   ALT 19 0 - 44 U/L   Alkaline Phosphatase 70 38 - 126 U/L   Total Bilirubin 0.6 0.3 - 1.2 mg/dL   GFR calc non Af Amer 47 (L) >60 mL/min   GFR calc Af Amer 54 (  L) >60 mL/min   Anion gap 8 5 - 15  Lipase, blood  Result Value Ref Range   Lipase 59 (H) 11 - 51 U/L  Troponin I  Result Value Ref Range   Troponin I 0.04 (HH) <0.03 ng/mL  CBC with Differential  Result Value Ref Range   WBC 10.0 4.0 - 10.5 K/uL   RBC 3.10 (L) 3.87 - 5.11 MIL/uL   Hemoglobin 9.3 (L) 12.0 - 15.0 g/dL   HCT 16.1 (L) 09.6 - 04.5 %   MCV 98.4 78.0 - 100.0 fL   MCH 30.0 26.0 - 34.0 pg   MCHC 30.5 30.0 - 36.0 g/dL   RDW 40.9 81.1 - 91.4 %   Platelets 267 150 - 400 K/uL   Neutrophils Relative % 78 %   Neutro Abs 7.8 (H) 1.7 - 7.7 K/uL   Lymphocytes Relative 13 %   Lymphs Abs 1.3 0.7 - 4.0 K/uL   Monocytes Relative 7 %   Monocytes Absolute 0.7 0.1 - 1.0 K/uL   Eosinophils Relative 2 %   Eosinophils Absolute 0.2 0.0 - 0.7 K/uL   Basophils Relative 0 %   Basophils Absolute 0.0 0.0 - 0.1 K/uL  Urinalysis, Routine w reflex microscopic  Result Value Ref Range   Color, Urine STRAW (A) YELLOW   APPearance CLEAR CLEAR   Specific Gravity, Urine 1.009 1.005 - 1.030   pH 5.0 5.0 - 8.0   Glucose, UA NEGATIVE NEGATIVE mg/dL   Hgb urine dipstick NEGATIVE NEGATIVE   Bilirubin Urine NEGATIVE NEGATIVE   Ketones, ur NEGATIVE NEGATIVE mg/dL   Protein, ur NEGATIVE NEGATIVE mg/dL   Nitrite NEGATIVE NEGATIVE   Leukocytes, UA NEGATIVE NEGATIVE  Protime-INR  Result Value Ref Range   Prothrombin Time 27.7 (H) 11.4 - 15.2 seconds   INR 2.61    Dg Abd Acute W/chest Result Date: 07/02/2018 CLINICAL DATA:  Nausea vomiting for 2 days. Aortic valve replacement. Ex-smoker. EXAM: DG ABDOMEN ACUTE W/ 1V CHEST COMPARISON:  Chest radiograph 06/26/2018 FINDINGS: Frontal view of the chest demonstrates midline trachea. Patient rotated to the right. Moderate cardiomegaly. Prior median sternotomy. Atherosclerosis in the transverse  aorta. Artifact projecting over the right lung base, without consolidation on upright abdominal image. No pleural effusion or pneumothorax. No congestive failure. Chronic interstitial thickening. Left base scarring. Osteopenia. Abdominal films demonstrate no free intraperitoneal air on upright positioning. Numerous leads and wires project over the upper abdomen and lower chest. No gaseous distention of bowel loops on supine imaging. Aortic atherosclerosis. IMPRESSION: No acute findings. Cardiomegaly without congestive failure. Aortic Atherosclerosis (ICD10-I70.0). Electronically Signed   By: Jeronimo Greaves M.D.   On: 07/02/2018 13:15    1430:  Labs per baseline. Mild lipase elevation, non-specific. Not orthostatic on VS. Pt has tol PO well while in the ED without N/V.  No stooling while in the ED.  Abd remains benign, VSS. Feels better and wants to go back to the NH now. Tx symptomatically at this time. Dx and testing d/w pt and family.  Questions answered.  Verb understanding, agreeable to d/c back to NH with outpt f/u.      Final Clinical Impressions(s) / ED Diagnoses   Final diagnoses:  Nausea and vomiting in adult    ED Discharge Orders    None       Samuel Jester, DO 07/06/18 0007

## 2018-07-02 NOTE — ED Notes (Signed)
Report given to [facility], LPN, [facility].

## 2018-07-02 NOTE — ED Notes (Signed)
[facility] staff to transport patient.

## 2018-07-02 NOTE — ED Triage Notes (Signed)
Pt sent from [facility] due to vomiting that started the previous day. Unknown the amount of episodes of vomiting.

## 2018-07-03 ENCOUNTER — Non-Acute Institutional Stay (SKILLED_NURSING_FACILITY): Payer: Medicare Other | Admitting: Internal Medicine

## 2018-07-03 ENCOUNTER — Encounter: Payer: Self-pay | Admitting: Internal Medicine

## 2018-07-03 ENCOUNTER — Other Ambulatory Visit: Payer: Self-pay

## 2018-07-03 ENCOUNTER — Encounter (HOSPITAL_COMMUNITY)
Admission: RE | Admit: 2018-07-03 | Discharge: 2018-07-03 | Disposition: A | Discharge status: Home / Self Care | Payer: Medicare Other | Source: Skilled Nursing Facility | Attending: Internal Medicine | Admitting: Internal Medicine

## 2018-07-03 ENCOUNTER — Inpatient Hospital Stay
Admission: RE | Admit: 2018-07-03 | Discharge: 2018-07-16 | Disposition: A | Discharge status: Nursing Home (Includes ICF) | Payer: Medicare Other | Source: Ambulatory Visit | Attending: Internal Medicine | Admitting: Internal Medicine

## 2018-07-03 ENCOUNTER — Telehealth: Payer: Self-pay | Admitting: *Deleted

## 2018-07-03 DIAGNOSIS — I10 Essential (primary) hypertension: Secondary | ICD-10-CM

## 2018-07-03 DIAGNOSIS — R1084 Generalized abdominal pain: Secondary | ICD-10-CM

## 2018-07-03 DIAGNOSIS — I5031 Acute diastolic (congestive) heart failure: Secondary | ICD-10-CM | POA: Insufficient documentation

## 2018-07-03 DIAGNOSIS — R52 Pain, unspecified: Principal | ICD-10-CM

## 2018-07-03 DIAGNOSIS — Z952 Presence of prosthetic heart valve: Secondary | ICD-10-CM | POA: Diagnosis not present

## 2018-07-03 LAB — CBC WITH DIFFERENTIAL/PLATELET
BASOS PCT: 0 %
Basophils Absolute: 0 10*3/uL (ref 0.0–0.1)
Eosinophils Absolute: 0.1 10*3/uL (ref 0.0–0.7)
Eosinophils Relative: 1 %
HEMATOCRIT: 31.2 % — AB (ref 36.0–46.0)
HEMOGLOBIN: 9.6 g/dL — AB (ref 12.0–15.0)
Lymphocytes Relative: 20 %
Lymphs Abs: 1.9 10*3/uL (ref 0.7–4.0)
MCH: 30.2 pg (ref 26.0–34.0)
MCHC: 30.8 g/dL (ref 30.0–36.0)
MCV: 98.1 fL (ref 78.0–100.0)
MONOS PCT: 7 %
Monocytes Absolute: 0.7 10*3/uL (ref 0.1–1.0)
NEUTROS ABS: 7.1 10*3/uL (ref 1.7–7.7)
NEUTROS PCT: 72 %
Platelets: 297 10*3/uL (ref 150–400)
RBC: 3.18 MIL/uL — AB (ref 3.87–5.11)
RDW: 13.8 % (ref 11.5–15.5)
WBC: 9.8 10*3/uL (ref 4.0–10.5)

## 2018-07-03 LAB — LIPID PANEL
CHOL/HDL RATIO: 2.7 ratio
Cholesterol: 151 mg/dL (ref 0–200)
HDL: 56 mg/dL (ref >40)
LDL Cholesterol: 84 mg/dL (ref 0–99)
Triglycerides: 57 mg/dL (ref <150)
VLDL: 11 mg/dL (ref 0–40)

## 2018-07-03 LAB — BASIC METABOLIC PANEL
Anion gap: 9 (ref 5–15)
BUN: 33 mg/dL — AB (ref 8–23)
CHLORIDE: 99 mmol/L (ref 98–111)
CO2: 34 mmol/L — ABNORMAL HIGH (ref 22–32)
CREATININE: 1.07 mg/dL — AB (ref 0.44–1.00)
Calcium: 9.3 mg/dL (ref 8.9–10.3)
GFR calc Af Amer: 52 mL/min — ABNORMAL LOW (ref >60)
GFR calc non Af Amer: 45 mL/min — ABNORMAL LOW (ref >60)
GLUCOSE: 87 mg/dL (ref 70–99)
POTASSIUM: 3.8 mmol/L (ref 3.5–5.1)
Sodium: 142 mmol/L (ref 135–145)

## 2018-07-03 LAB — PROTIME-INR
INR: 2.85
PROTHROMBIN TIME: 29.7 s — AB (ref 11.4–15.2)

## 2018-07-03 MED ORDER — PREGABALIN 75 MG PO CAPS: 75.0000 mg | ORAL_CAPSULE | ORAL | 0 refills | Status: DC

## 2018-07-03 NOTE — Telephone Encounter (Signed)
Lupita LeashDonna dtr called and stated pt that the pt was in the [facility] and being taken care of and they are checking her INR. She states INR was 2.8 today . Advised her to call back once pt is discharged so we can get an appt.

## 2018-07-03 NOTE — Progress Notes (Signed)
Location:    [facility] Room Number: 129/P Place of Service:  SNF [phone]) Provider:  Laurette Schimke, MD  Patient Care Team: Kari Baars, MD as PCP - General (Internal Medicine)  Extended Emergency Contact Information Primary Emergency Contact: Lisbeth Ply Brett Canales Address: [address]          [place], [place] [address] [place] Home Phone: [phone] Work Phone: [phone] Mobile Phone: [phone] Relation: Other Secondary Emergency Contact: Greer Ee Mobile Phone: [phone] Relation: Granddaughter Preferred language: English Interpreter needed? No  Code Status:  Full Code Goals of care: Advanced Directive information Advanced Directives 07/03/2018  Does Patient Have a Medical Advance Directive? Yes  Type of Advance Directive (No Data)  Does patient want to make changes to medical advance directive? No - Patient declined  Copy of Healthcare Power of Attorney in Chart? -  Pre-existing out of facility DNR order (yellow form or pink MOST form) -     Chief Complaint  Patient presents with  . Acute Visit    Patient is being seen for f/u from ED visit    HPI:  Pt is a 84 y.o. female seen today for an acute visit for follow-up of visit to emergency department yesterday for nausea vomiting and not looking well.  She was just admitted to facility late last week after hospitalization for diastolic CHF exacerbation-she was treated with Lasix- she did have physical therapy and had significant hip pain thought she would benefit from rehab here in skilled nursing.  She also had elevated blood pressures adjustments were made including starting hydralazine 10 mg 3 times a day she is also on Coreg 6.25 mg a day as well as lotensin 40 mg a day.  Apparently complained of increased nausea and vomiting late Saturday and then early Sunday- she complained of increased abdominal pain- per nurse  looked quite uncomfortable  and sick.  She was sent to the ER where lab work was reassuring as well as abdominal x-ray which did not show any acute process  Apparently she said she felt much better during her time the ER she has come back and appears to be doing well today.  She does not complain of any nausea vomiting abdominal pain.  She does have an order for Zofran as needed  Note she also has a history of an aortic valve replacement with goal INR of 2.2-3-she is on chronic Coumadin 5 mg on Saturdays and 2.5 mg other days- apparently she has been on this  Dose  for some time  Past Medical History:  Diagnosis Date  . Carotid artery stenosis    Without infarction  . Coronary artery disease   . Heart valve replaced by other means   . Hypercholesterolemia    Pure  . Hypertension    Unspecified  . LBBB (left bundle branch block)   . Macular degeneration (senile) of retina, unspecified   . Postsurgical aortocoronary bypass status   . Stroke (HCC)   . Transient global amnesia   . Unspecified hereditary and idiopathic peripheral neuropathy   . Unspecified vitamin D deficiency    Past Surgical History:  Procedure Laterality Date  . AORTIC VALVE REPLACEMENT    . APPENDECTOMY    . CORONARY ARTERY BYPASS GRAFT    . TONSILLECTOMY      Allergies  Allergen Reactions  . Codeine   . Penicillins     Outpatient Encounter Medications as of 07/03/2018  Medication Sig  .  acetaminophen (TYLENOL) 325 MG tablet Take 2 tablets (650 mg total) by mouth every 6 (six) hours as needed for mild pain (or Fever >/= 101).  Marland Kitchen ALPRAZolam (XANAX) 0.25 MG tablet Take 1 tablet (0.25 mg total) by mouth at bedtime as needed for anxiety.  Marland Kitchen atorvastatin (LIPITOR) 40 MG tablet Take 1 tablet (40 mg total) by mouth daily.  . benazepril (LOTENSIN) 40 MG tablet TAKE (1) TABLET BY MOUTH ONCE DAILY.  . carvedilol (COREG) 6.25 MG tablet Take 6.25 mg by mouth 2 (two) times daily with a meal.  . donepezil (ARICEPT) 10 MG tablet TAKE ONE  TABLET BY MOUTH ONCE DAILY.  . ergocalciferol (VITAMIN D2) 50000 UNITS capsule Take 50,000 Units by mouth every Tuesday.   . furosemide (LASIX) 40 MG tablet Take 1 tablet (40 mg total) by mouth daily.  . hydrALAZINE (APRESOLINE) 10 MG tablet Take 1 tablet (10 mg total) by mouth every 8 (eight) hours.  Marland Kitchen levalbuterol (XOPENEX) 0.63 MG/3ML nebulizer solution Take 3 mLs (0.63 mg total) by nebulization every 8 (eight) hours as needed for wheezing or shortness of breath.  . meclizine (ANTIVERT) 25 MG tablet Take 25 mg by mouth 3 (three) times daily as needed for dizziness.  . Melatonin 3 MG TABS Take 1 tablet (3 mg total) by mouth at bedtime as needed.  . ondansetron (ZOFRAN ODT) 4 MG disintegrating tablet Take 1 tablet (4 mg total) by mouth every 8 (eight) hours as needed for nausea or vomiting.  . potassium chloride (K-DUR,KLOR-CON) 10 MEQ tablet Take 1 tablet (10 mEq total) by mouth 2 (two) times daily.  . pregabalin (LYRICA) 75 MG capsule Take 1 capsule (75 mg total) by mouth every other day.  . promethazine (PHENERGAN) 12.5 MG tablet Take 12.5 mg by mouth every 6 (six) hours as needed for nausea or vomiting. Take for 24 hours from 8/18/-07/03/2018  . traMADol (ULTRAM) 50 MG tablet Take 50 mg by mouth every 12 (twelve) hours as needed for moderate pain or severe pain.   Marland Kitchen umeclidinium-vilanterol (ANORO ELLIPTA) 62.5-25 MCG/INH AEPB Inhale 1 puff into the lungs every morning.  . warfarin (COUMADIN) 2.5 MG tablet Take 2.5 mg by mouth daily. Take on Sun., Mon., Tues., Wed., Thru., Fri.  . warfarin (COUMADIN) 5 MG tablet Take 5 mg by mouth. Take once a day on Sat.,  . [DISCONTINUED] traZODone (DESYREL) 50 MG tablet Take 50 mg by mouth at bedtime as needed for sleep.   . [DISCONTINUED] warfarin (COUMADIN) 5 MG tablet TAKE 1/2 TO 1 TABLET DAILY AS DIRECTED BY COUMADIN CLINIC.   No facility-administered encounter medications on file as of 07/03/2018.     Review of Systems  General  She does not  complain of any fever chills says she feels much better than she did yesterday.  Skin is not complaining of any rashes or itching or diaphoresis.  Head ears eyes nose mouth and throat is not complain of any sore throat or visual changes.  Respiratory does not complain of shortness of breath or cough.  .  Cardiac does not complain of chest pain does not appear to have significant lower extremity edema.    GI is not complaining of abdominal pain today nausea or vomiting.  Does not complain of diarrhea constipation.  Musculoskeletal at times will complain of hip pain-but appears to be feeling better than she did previously.  Neurologic does not complain of dizziness headache or syncope.  Psych does not complain of being depressed or anxious appears to  be in good spirits feeling pretty good      Immunization History  Administered Date(s) Administered  . Influenza-Unspecified 08/21/2015  . Pneumococcal Conjugate-13 08/21/2014  . Pneumococcal-Unspecified 08/21/2015  . Tdap 11/12/2013  . Zoster 08/31/2012   Pertinent  Health Maintenance Due  Topic Date Due  . INFLUENZA VACCINE  08/03/2018 (Originally 06/15/2018)  . DEXA SCAN  08/03/2018 (Originally 11/02/1993)  . PNA vac Low Risk Adult  Completed   No flowsheet data found. Functional Status Survey:    Vitals:   07/03/18 1631 07/03/18 1632  BP: (!) 130/98 (!) 107/44  Pulse: 65 70  Resp: 20   Temp: 97.6 F (36.4 C)   TempSrc: Oral   SpO2: 93%   Blood pressure manually this afternoon is 104/70- machine reading was comparable  Physical Exam   General this is a very pleasant elderly female in no distress lying comfortably in bed she is bright alert\  Her skin is warm and dry.  Eyes she has prescription lenses visual acuity appears grossly intact.  .  Oropharynx is clear mucous membranes moist.    Chest is clear to auscultation there is no labored breathing.  Breath sounds are somewhat reduced.  Heart is  regular rate and rhythm with an audible click she does not have significant lower extremity edema.  Abdomen soft nontender with positive bowel sounds.  Musculoskeletal is able to move all extremities x4 to appears at baseline.  Neurologic is grossly intact her speech is clear no lateralizing findings.  Psych she is largely alert and oriented very pleasant and appropriate     Labs reviewed: Recent Labs    06/27/18 0018  07/01/18 0702 07/02/18 1147 07/03/18 0700  NA  --    < > 143 141 142  K  --    < > 4.4 4.1 3.8  CL  --    < > 104 102 99  CO2  --    < > 30 31 34*  GLUCOSE  --    < > 91 111* 87  BUN  --    < > 36* 33* 33*  CREATININE  --    < > 0.99 1.03* 1.07*  CALCIUM  --    < > 9.0 8.8* 9.3  MG 1.9  --   --   --   --    < > = values in this interval not displayed.   Recent Labs    06/26/18 1935 07/02/18 1147  AST 36 26  ALT 24 19  ALKPHOS 73 70  BILITOT 0.7 0.6  PROT 6.5 6.4*  ALBUMIN 3.5 3.2*   Recent Labs    06/26/18 1935  06/30/18 0610 07/02/18 1147 07/03/18 0700  WBC 8.9   < > 9.5 10.0 9.8  NEUTROABS 7.0  --   --  7.8* 7.1  HGB 9.0*   < > 9.5* 9.3* 9.6*  HCT 29.3*   < > 31.3* 30.5* 31.2*  MCV 98.0   < > 98.7 98.4 98.1  PLT 193   < > 233 267 297   < > = values in this interval not displayed.   Lab Results  Component Value Date   TSH 2.079 09/11/2014   No results found for: HGBA1C Lab Results  Component Value Date   CHOL 151 07/03/2018   HDL 56 07/03/2018   LDLCALC 84 07/03/2018   TRIG 57 07/03/2018   CHOLHDL 2.7 07/03/2018    Significant Diagnostic Results in last 30 days:  Dg Chest 2  View  Result Date: 06/26/2018 CLINICAL DATA:  Weakness, right hip pain EXAM: CHEST - 2 VIEW COMPARISON:  03/10/2017, CT chest 03/28/2018 FINDINGS: Sternotomy changes. Patient is rotated. Small bilateral pleural effusions. Hazy airspace disease at the bases. Cardiomegaly with vascular congestion and mild diffuse interstitial opacity consistent with edema.  Aortic atherosclerosis. No pneumothorax. Old right-sided rib deformities. Emphysematous disease. IMPRESSION: 1. Cardiomegaly with vascular congestion, mild interstitial pulmonary edema, and small bilateral pleural effusions. 2. Hazy airspace disease at the bases may reflect atelectasis or edema. Electronically Signed   By: Jasmine PangKim  Fujinaga M.D.   On: 06/26/2018 20:11   Ct Hip Right Wo Contrast  Result Date: 06/26/2018 CLINICAL DATA:  Right hip pain and shortness of breath. Abnormal radiographs of right hip. EXAM: CT OF THE RIGHT HIP WITHOUT CONTRAST TECHNIQUE: Multidetector CT imaging of the right hip was performed according to the standard protocol. Multiplanar CT image reconstructions were also generated. COMPARISON:  Right hip radiographs 06/26/2018 FINDINGS: Bones/Joint/Cartilage Degenerative changes in the right hip with osteophytes on the acetabular and femoral surfaces. No evidence of acute fracture or dislocation of the right hip. Plain radiographic finding of cortical step-off likely represented osteophyte formation. Old healed fracture deformities are demonstrated in the right superior and inferior pubic rami. There is a small right hip joint effusion. Ligaments Suboptimally assessed by CT. Muscles and Tendons Mild muscle atrophy. No intramuscular mass or hematoma suggested. Soft tissues The bladder is distended, possibly physiologic or possibly due to urinary retention. Vascular calcifications are present. There is a small right inguinal hernia containing small bowel without apparent proximal obstruction. IMPRESSION: No acute fracture or dislocation of the right hip. Degenerative changes in the right hip. Small right hip joint effusion. Small right inguinal hernia containing small bowel without apparent proximal obstruction. Electronically Signed   By: Burman NievesWilliam  Stevens M.D.   On: 06/26/2018 23:56   Dg Abd Acute W/chest  Result Date: 07/02/2018 CLINICAL DATA:  Nausea vomiting for 2 days. Aortic valve  replacement. Ex-smoker. EXAM: DG ABDOMEN ACUTE W/ 1V CHEST COMPARISON:  Chest radiograph 06/26/2018 FINDINGS: Frontal view of the chest demonstrates midline trachea. Patient rotated to the right. Moderate cardiomegaly. Prior median sternotomy. Atherosclerosis in the transverse aorta. Artifact projecting over the right lung base, without consolidation on upright abdominal image. No pleural effusion or pneumothorax. No congestive failure. Chronic interstitial thickening. Left base scarring. Osteopenia. Abdominal films demonstrate no free intraperitoneal air on upright positioning. Numerous leads and wires project over the upper abdomen and lower chest. No gaseous distention of bowel loops on supine imaging. Aortic atherosclerosis. IMPRESSION: No acute findings. Cardiomegaly without congestive failure. Aortic Atherosclerosis (ICD10-I70.0). Electronically Signed   By: Jeronimo GreavesKyle  Talbot M.D.   On: 07/02/2018 13:15   Dg Hip Unilat With Pelvis 2-3 Views Right  Result Date: 06/26/2018 CLINICAL DATA:  Right hip and groin pain after a fall EXAM: DG HIP (WITH OR WITHOUT PELVIS) 2-3V RIGHT COMPARISON:  None. FINDINGS: Limited evaluation of the right femoral neck due to superimposition of trochanter. Possible step-off deformity at the right femoral head neck junction. Old appearing fracture deformity of the right superior and inferior pubic rami. IMPRESSION: 1. Limited evaluation of femoral neck due to positioning, questionable step-off deformity at the right femoral head neck junction, suggest CT to better evaluate. 2. Old appearing fractures of the right superior and inferior pubic rami. Electronically Signed   By: Jasmine PangKim  Fujinaga M.D.   On: 06/26/2018 20:13    Assessment/Plan  #1 history of abdominal pain nausea and vomiting-this  appears to have resolved fairly unremarkably again work-up in the hospital did not really show anything concerning-abdominal x-ray did not show any acute process labs were grossly within normal  limits.  She is feeling much better today she does have Zofran as needed.  2.-  History of aortic valve replacement with goal INR 2.2-3-INR today is 2.85-it is gradually rising but she did receive 5 mg Saturday night which probably has increased it  a bit-we will recheck this tomorrow- no evidence of increased bruising or bleeding.  3.-  Hypertension?-She does have an order for hydralazine- blood pressures appear to be borderline low I got 104/70 manually- previously written in order to hold hydralazine for systolic less than 120- will reiterate this to nursing staff.  She continues on Lotensin 40 mg a day in addition to Coreg 6.25 mg twice daily-  the hydralazine was added in the hospital because her blood pressures were running somewhat high but this does not appear to be the case at  [facility]  in skilled nursing  We will continue to monitor.  [id number]

## 2018-07-03 NOTE — ED Notes (Signed)
Pt discharged by previous nurse 

## 2018-07-03 NOTE — Telephone Encounter (Signed)
RX Fax for [facility]@ [phone]  

## 2018-07-04 ENCOUNTER — Non-Acute Institutional Stay (SKILLED_NURSING_FACILITY): Payer: Medicare Other | Admitting: Internal Medicine

## 2018-07-04 ENCOUNTER — Encounter: Payer: Self-pay | Admitting: Internal Medicine

## 2018-07-04 ENCOUNTER — Other Ambulatory Visit: Payer: Self-pay | Admitting: *Deleted

## 2018-07-04 ENCOUNTER — Encounter (HOSPITAL_COMMUNITY)
Admission: RE | Admit: 2018-07-04 | Discharge: 2018-07-04 | Disposition: A | Discharge status: Home / Self Care | Payer: Medicare Other | Source: Skilled Nursing Facility | Attending: Internal Medicine | Admitting: Internal Medicine

## 2018-07-04 DIAGNOSIS — M25551 Pain in right hip: Secondary | ICD-10-CM | POA: Diagnosis not present

## 2018-07-04 DIAGNOSIS — D649 Anemia, unspecified: Secondary | ICD-10-CM | POA: Diagnosis not present

## 2018-07-04 DIAGNOSIS — I10 Essential (primary) hypertension: Secondary | ICD-10-CM

## 2018-07-04 DIAGNOSIS — Z952 Presence of prosthetic heart valve: Secondary | ICD-10-CM

## 2018-07-04 DIAGNOSIS — E785 Hyperlipidemia, unspecified: Secondary | ICD-10-CM

## 2018-07-04 DIAGNOSIS — Z9181 History of falling: Secondary | ICD-10-CM | POA: Insufficient documentation

## 2018-07-04 DIAGNOSIS — I2510 Atherosclerotic heart disease of native coronary artery without angina pectoris: Secondary | ICD-10-CM | POA: Insufficient documentation

## 2018-07-04 DIAGNOSIS — J441 Chronic obstructive pulmonary disease with (acute) exacerbation: Secondary | ICD-10-CM | POA: Insufficient documentation

## 2018-07-04 DIAGNOSIS — I5032 Chronic diastolic (congestive) heart failure: Secondary | ICD-10-CM | POA: Insufficient documentation

## 2018-07-04 DIAGNOSIS — I5033 Acute on chronic diastolic (congestive) heart failure: Secondary | ICD-10-CM

## 2018-07-04 DIAGNOSIS — J9611 Chronic respiratory failure with hypoxia: Secondary | ICD-10-CM | POA: Insufficient documentation

## 2018-07-04 LAB — PROTIME-INR
INR: 2.9
Prothrombin Time: 30.1 seconds — ABNORMAL HIGH (ref 11.4–15.2)

## 2018-07-04 LAB — URINE CULTURE: Culture: NO GROWTH

## 2018-07-04 MED ORDER — ALPRAZOLAM 0.25 MG PO TABS: 0.2500 mg | ORAL_TABLET | Freq: Every evening | ORAL | 0 refills | Status: DC | PRN

## 2018-07-04 MED ORDER — PREGABALIN 75 MG PO CAPS: 75.0000 mg | ORAL_CAPSULE | ORAL | 0 refills | Status: DC

## 2018-07-04 MED ORDER — TRAMADOL HCL 50 MG PO TABS: 50.0000 mg | ORAL_TABLET | Freq: Two times a day (BID) | ORAL | 0 refills | Status: DC | PRN

## 2018-07-04 NOTE — Telephone Encounter (Signed)
[facility].  Filled out Rx and given to Arlo to sign and fax [facility] to [facility].

## 2018-07-04 NOTE — Progress Notes (Signed)
Provider:  Dr. Einar CrowAnjali Jacquelin Simpson Location:  [facility] Room Number: [phone] Place of Service:  SNF (31)  PCP: Kari BaarsHawkins, Edward, MD Patient Care Team: Kari BaarsHawkins, Edward, MD as PCP - General (Internal Medicine)  Extended Emergency Contact Information Primary Emergency Contact: [name],[name] & Brett CanalesSteve Address: [address]          [place], [place] [address] [place] Home Phone: [phone] Work Phone: [phone] Mobile Phone: [phone] Relation: Other Secondary Emergency Contact: Greer Eearrish, Heather Mobile Phone: [phone] Relation: Granddaughter Preferred language: English Interpreter needed? No  Code Status: Full Goals of Care: Advanced Directive information Advanced Directives 07/03/2018  Does Patient Have a Medical Advance Directive? Yes  Type of Advance Directive (No Data)  Does patient want to make changes to medical advance directive? No - Patient declined  Copy of Healthcare Power of Attorney in Chart? -  Pre-existing out of facility DNR order (yellow form or pink MOST form) -      Chief Complaint  Patient presents with  . New Admit To SNF    New Admit to [facility]    HPI: Patient is a 84 y.o. female seen today for admission to SNF  For therapy after staying in the hospital from 06/26/18- 08/ 17    For Acute Diastolic Failure.  Patient have h/o CAD, LBBB, AVR on Chronic Coumadin, Hyperlipidemia, Hypertension, Macular degeneration,  She fell at home and was brought to the ED. She did not have any acute fracture. Her CT scan of Right Hip was Negative. But she was found to be in acute over Chronic Diastolic CHF. She was treated with Diuresis. Her Lasix dose was increased to 40 mg. And she was also started on Hydralazine. Patient is doing well in Facility. Denies any Chest pain . Does feel some weakness with Therapy. Her BP is fluctuating with Low SBP of 100 to 160 She continues to have some mild pain in right groin area. Patient  was very active at home. She was driving. Walks with the [employer]   Past Medical History:  Diagnosis Date  . Carotid artery stenosis    Without infarction  . Coronary artery disease   . Heart valve replaced by other means   . Hypercholesterolemia    Pure  . Hypertension    Unspecified  . LBBB (left bundle branch block)   . Macular degeneration (senile) of retina, unspecified   . Postsurgical aortocoronary bypass status   . Stroke (HCC)   . Transient global amnesia   . Unspecified hereditary and idiopathic peripheral neuropathy   . Unspecified vitamin D deficiency    Past Surgical History:  Procedure Laterality Date  . AORTIC VALVE REPLACEMENT    . APPENDECTOMY    . CORONARY ARTERY BYPASS GRAFT    . TONSILLECTOMY      reports that she quit smoking about 26 years ago. Her smoking use included cigarettes. She smoked 0.50 packs per day. She has never used smokeless tobacco. She reports that she does not drink alcohol or use drugs. Social History   Socioeconomic History  . Marital status: Widowed    Spouse name: Not on file  . Number of children: 2  . Years of education: 12th  . Highest education level: Not on file  Occupational History    Employer: RETIRED  Social Needs  . Financial resource strain: Not on file  . Food insecurity:    Worry: Not on file    Inability: Not on file  .  Transportation needs:    Medical: Not on file    Non-medical: Not on file  Tobacco Use  . Smoking status: Former Smoker    Packs/day: 0.50    Types: Cigarettes    Last attempt to quit: 11/30/1991    Years since quitting: 26.6  . Smokeless tobacco: Never Used  . Tobacco comment: Tobacco use-no  Substance and Sexual Activity  . Alcohol use: No  . Drug use: No  . Sexual activity: Never  Lifestyle  . Physical activity:    Days per week: Not on file    Minutes per session: Not on file  . Stress: Not on file  Relationships  . Social connections:    Talks on phone: Not on file     Gets together: Not on file    Attends religious service: Not on file    Active member of club or organization: Not on file    Attends meetings of clubs or organizations: Not on file    Relationship status: Not on file  . Intimate partner violence:    Fear of current or ex partner: Not on file    Emotionally abused: Not on file    Physically abused: Not on file    Forced sexual activity: Not on file  Other Topics Concern  . Not on file  Social History Narrative   Pt lives at home alone.   Caffeine Use: very rarely    Functional Status Survey:    Family History  Problem Relation Age of Onset  . Heart attack Father   . Stroke Sister   . Neuropathy Brother   . COPD Sister     Health Maintenance  Topic Date Due  . INFLUENZA VACCINE  08/03/2018 (Originally 06/15/2018)  . DEXA SCAN  08/03/2018 (Originally 11/02/1993)  . TETANUS/TDAP  11/13/2023  . PNA vac Low Risk Adult  Completed    Allergies  Allergen Reactions  . Codeine   . Penicillins     Outpatient Encounter Medications as of 07/04/2018  Medication Sig  . acetaminophen (TYLENOL) 325 MG tablet Take 2 tablets (650 mg total) by mouth every 6 (six) hours as needed for mild pain (or Fever >/= 101).  Marland Kitchen ALPRAZolam (XANAX) 0.25 MG tablet Take 1 tablet (0.25 mg total) by mouth at bedtime as needed for anxiety.  Marland Kitchen atorvastatin (LIPITOR) 40 MG tablet Take 1 tablet (40 mg total) by mouth daily.  . benazepril (LOTENSIN) 40 MG tablet TAKE (1) TABLET BY MOUTH ONCE DAILY.  . carvedilol (COREG) 6.25 MG tablet Take 6.25 mg by mouth 2 (two) times daily with a meal.  . donepezil (ARICEPT) 10 MG tablet TAKE ONE TABLET BY MOUTH ONCE DAILY.  . ergocalciferol (VITAMIN D2) 50000 UNITS capsule Take 50,000 Units by mouth every Tuesday.   . furosemide (LASIX) 40 MG tablet Take 1 tablet (40 mg total) by mouth daily.  . hydrALAZINE (APRESOLINE) 10 MG tablet Take 1 tablet (10 mg total) by mouth every 8 (eight) hours.  Marland Kitchen levalbuterol (XOPENEX) 0.63  MG/3ML nebulizer solution Take 3 mLs (0.63 mg total) by nebulization every 8 (eight) hours as needed for wheezing or shortness of breath.  . meclizine (ANTIVERT) 25 MG tablet Take 25 mg by mouth 3 (three) times daily as needed for dizziness.  . Melatonin 3 MG TABS Take 1 tablet (3 mg total) by mouth at bedtime as needed.  . ondansetron (ZOFRAN ODT) 4 MG disintegrating tablet Take 1 tablet (4 mg total) by mouth every 8 (eight) hours  as needed for nausea or vomiting.  . potassium chloride (K-DUR,KLOR-CON) 10 MEQ tablet Take 1 tablet (10 mEq total) by mouth 2 (two) times daily.  . pregabalin (LYRICA) 75 MG capsule Take 1 capsule (75 mg total) by mouth every other day.  . promethazine (PHENERGAN) 12.5 MG tablet Take 12.5 mg by mouth every 6 (six) hours as needed for nausea or vomiting. Take for 24 hours from 8/18/-07/03/2018  . traMADol (ULTRAM) 50 MG tablet Take 50 mg by mouth every 12 (twelve) hours as needed for moderate pain or severe pain.   Marland Kitchen umeclidinium-vilanterol (ANORO ELLIPTA) 62.5-25 MCG/INH AEPB Inhale 1 puff into the lungs every morning.  . warfarin (COUMADIN) 2.5 MG tablet Take 2.5 mg by mouth daily. Take on Sun., Mon., Tues., Wed., Thru., Fri.  . warfarin (COUMADIN) 5 MG tablet Take 5 mg by mouth. Take once a day on Sat.,   No facility-administered encounter medications on file as of 07/04/2018.     Review of Systems  Review of Systems  Constitutional: Negative for activity change, appetite change, chills, diaphoresis, fatigue and fever.  HENT: Negative for mouth sores, postnasal drip, rhinorrhea, sinus pain and sore throat.   Respiratory: Negative for apnea, cough, chest tightness, shortness of breath and wheezing.   Cardiovascular: Negative for chest pain, palpitations and leg swelling.  Gastrointestinal: Negative for abdominal distention, abdominal pain, constipation, diarrhea, nausea and vomiting.  Genitourinary: Negative for dysuria and frequency.  Musculoskeletal: Negative for  arthralgias, joint swelling and myalgias.  Skin: Negative for rash.  Neurological: Negative for dizziness, syncope, weakness, light-headedness and numbness.  Psychiatric/Behavioral: Negative for behavioral problems, confusion and sleep disturbance.     Vitals:   07/04/18 0835  BP: 123/60  Pulse: 75  Temp: (!) 96.6 F (35.9 C)  TempSrc: Oral  SpO2: 99%  Weight: 101 lb 6.4 oz (46 kg)  Height: 5\' 5"  (1.651 m)   Body mass index is 16.87 kg/m. Physical Exam  Constitutional: Oriented to person, place, and time. Well-developed and well-nourished.  HENT:  Head: Normocephalic.  Mouth/Throat: Oropharynx is clear and moist.  Eyes: Pupils are equal, round, and reactive to light.  Neck: Neck supple.  Cardiovascular: Normal rate and normal heart sounds.  No murmur heard. Pulmonary/Chest: Effort normal and Decreased BS Bilateral No respiratory distress. No wheezes. She has no rales.  Abdominal: Soft. Bowel sounds are normal. No distension. There is no tenderness. There is no rebound.  Musculoskeletal: No edema.  Lymphadenopathy: none Neurological: Alert and oriented to person, place, and time.  Skin: Skin is warm and dry.  Psychiatric: Normal mood and affect. Behavior is normal. Thought content normal.    Labs reviewed: Basic Metabolic Panel: Recent Labs    06/27/18 0018  07/01/18 0702 07/02/18 1147 07/03/18 0700  NA  --    < > 143 141 142  K  --    < > 4.4 4.1 3.8  CL  --    < > 104 102 99  CO2  --    < > 30 31 34*  GLUCOSE  --    < > 91 111* 87  BUN  --    < > 36* 33* 33*  CREATININE  --    < > 0.99 1.03* 1.07*  CALCIUM  --    < > 9.0 8.8* 9.3  MG 1.9  --   --   --   --    < > = values in this interval not displayed.   Liver Function Tests: Recent  Labs    06/26/18 1935 07/02/18 1147  AST 36 26  ALT 24 19  ALKPHOS 73 70  BILITOT 0.7 0.6  PROT 6.5 6.4*  ALBUMIN 3.5 3.2*   Recent Labs    07/02/18 1147  LIPASE 59*   No results for input(s): AMMONIA in the last  8760 hours. CBC: Recent Labs    06/26/18 1935  06/30/18 0610 07/02/18 1147 07/03/18 0700  WBC 8.9   < > 9.5 10.0 9.8  NEUTROABS 7.0  --   --  7.8* 7.1  HGB 9.0*   < > 9.5* 9.3* 9.6*  HCT 29.3*   < > 31.3* 30.5* 31.2*  MCV 98.0   < > 98.7 98.4 98.1  PLT 193   < > 233 267 297   < > = values in this interval not displayed.   Cardiac Enzymes: Recent Labs    06/27/18 0614 06/27/18 1240 07/02/18 1147  TROPONINI 0.06* 0.05* 0.04*   BNP: Invalid input(s): POCBNP No results found for: HGBA1C Lab Results  Component Value Date   TSH 2.079 09/11/2014   No results found for: VITAMINB12 No results found for: FOLATE No results found for: IRON, TIBC, FERRITIN  Imaging and Procedures obtained prior to SNF admission: No results found.  Assessment/Plan  Acute on chronic diastolic congestive heart failure  Daily weights Continue on Lasix 40 mg Repeat BMP Her Baseline weight is 98-100lbs On Fluid restriction  Essential hypertension Fluctuating in the facility  Continue Hydralazine if SBP lower then 100  S/P AVR (aortic valve replacement) On Coumadin Dose held today Repeat INR in few days Restart same dose on Wed. She has been stable on this dose as outpatient. CAD Medical management On Aspirin, Statin, Lotensin and Coreg  Right hip pain Continue therapy CT scan in hospital was negative On Prn Ultram  Hyperlipidemia On Statin  Anemia, Hgb Stable Repeat CBC Further work up as out patient COPD On Chronic Oxygen And Anoro  Constipation Will start on Colace  Disposition Per Daughter patient was doing well by herself with some help at home On Aricept for Mild Cognitive impairment       Family/ staff Communication:   Labs/tests ordered: Total time spent in this patient care encounter was 45_ minutes; greater than 50% of the visit spent counseling patient, reviewing records , Labs and coordinating care for problems addressed at this encounter.

## 2018-07-04 NOTE — Patient Outreach (Signed)
[facility]) Care Management  07/04/2018  [name] 12-02-1925 [id number]   Onsite visit and IDT meeting.   Patient lives alone with support from her daughter and some private pay aides.  Anticipate patient will be at facility a short time.   Met with patient at facility, she reports she hopes to only be at facility about "2 weeks".  She reports she has private pay help for IADLs however, she was driving and going to the gym and working out several times a week. She states she hurt her right thigh somehow and had a fall at home, and also ended up in HF.   RNCM discussed [facility] care management services that are available. Gave [facility] brochure for her to review.  Plan to meet with patient again at next facility visit to follow up for [facility] CM care management.  [name]. [name], RN, BSN, [facility] [phone]) Business Cell  [phone]) Toll Free Office

## 2018-07-05 ENCOUNTER — Other Ambulatory Visit (HOSPITAL_COMMUNITY): Payer: Self-pay | Admitting: *Deleted

## 2018-07-05 ENCOUNTER — Encounter (HOSPITAL_COMMUNITY)
Admission: RE | Admit: 2018-07-05 | Discharge: 2018-07-05 | Disposition: A | Discharge status: Home / Self Care | Payer: Medicare Other | Source: Skilled Nursing Facility | Attending: Internal Medicine | Admitting: Internal Medicine

## 2018-07-05 ENCOUNTER — Other Ambulatory Visit (HOSPITAL_COMMUNITY): Payer: Medicare Other

## 2018-07-05 DIAGNOSIS — I5032 Chronic diastolic (congestive) heart failure: Secondary | ICD-10-CM | POA: Insufficient documentation

## 2018-07-05 DIAGNOSIS — J441 Chronic obstructive pulmonary disease with (acute) exacerbation: Secondary | ICD-10-CM | POA: Insufficient documentation

## 2018-07-05 DIAGNOSIS — Z9181 History of falling: Secondary | ICD-10-CM | POA: Insufficient documentation

## 2018-07-05 DIAGNOSIS — J9611 Chronic respiratory failure with hypoxia: Secondary | ICD-10-CM | POA: Insufficient documentation

## 2018-07-06 ENCOUNTER — Non-Acute Institutional Stay (SKILLED_NURSING_FACILITY): Payer: Medicare Other | Admitting: Internal Medicine

## 2018-07-06 ENCOUNTER — Encounter: Payer: Self-pay | Admitting: Internal Medicine

## 2018-07-06 DIAGNOSIS — Z952 Presence of prosthetic heart valve: Secondary | ICD-10-CM

## 2018-07-06 DIAGNOSIS — I25708 Atherosclerosis of coronary artery bypass graft(s), unspecified, with other forms of angina pectoris: Secondary | ICD-10-CM | POA: Diagnosis not present

## 2018-07-06 DIAGNOSIS — I10 Essential (primary) hypertension: Secondary | ICD-10-CM

## 2018-07-06 DIAGNOSIS — I5033 Acute on chronic diastolic (congestive) heart failure: Secondary | ICD-10-CM | POA: Diagnosis not present

## 2018-07-06 NOTE — Progress Notes (Signed)
Location:   [facility] Room Number: [phone] Place of Service:  SNF [phone]) Provider:  Laurette SchimkeArlo Aarin Bluett  Hawkins, Edward, MD  Patient Care Team: Kari BaarsHawkins, Edward, MD as PCP - General (Internal Medicine)  Extended Emergency Contact Information Primary Emergency Contact: [name],[name] & Brett CanalesSteve Address: [address]          [place], [place] [address] [place] Home Phone: [phone] Work Phone: [phone] Mobile Phone: [phone] Relation: Other Secondary Emergency Contact: Greer Eearrish, Heather Mobile Phone: [phone] Relation: Granddaughter Preferred language: English Interpreter needed? No  Code Status:  Full Goals of care: Advanced Directive information Advanced Directives 07/03/2018  Does Patient Have a Medical Advance Directive? Yes  Type of Advance Directive (No Data)  Does patient want to make changes to medical advance directive? No - Patient declined  Copy of Healthcare Power of Attorney in Chart? -  Pre-existing out of facility DNR order (yellow form or pink MOST form) -     Chief Complaint  Patient presents with  . Acute Visit    Blood Pressure Issues    HPI:  Pt is a 84 y.o. female seen today for an acute visit for Blood pressure issues.  Patient is here for rehab after hospitalization for diastolic CHF.  She also has a history of coronary artery disease as well as left bundle branch block- , AVR on Chronic Coumadin, Hyperlipidemia, Hypertension, Macular degeneration  She fell at home was brought to the ED did not have any acute fracture-but she was found to be in diastolic CHF- was increased to 40 mg a day she was started on hydralazine for elevated blood pressures-she is also on Coreg and benazepril.  Her blood pressure continues to fluctuate- she does receive her hydralazine periodically with parameters to hold this for lower systolic blood pressures.  Blood pressure morning is 89/44- previous blood pressures 120/52-123/60- earlier  this morning her systolic blood pressure was in the 130s she received her hydralazine  Her manual blood pressure later this morning and got 104/44 lying-- 100/ 42 sitting--100/40 standing  Sometimes she says she does feel somewhat weak and possibly dizzy when she stands  -- does have a history of vertigo as well and does receive Antivert as needed  In regards to diastolic CHF this appears stable currently she is on Lasix 40 mg a day potassium supplementation- she is also on an ACE inhibitor   currently she appears to be gaining strength continues to be in good spirits  Past Medical History:  Diagnosis Date  . Carotid artery stenosis    Without infarction  . Coronary artery disease   . Heart valve replaced by other means   . Hypercholesterolemia    Pure  . Hypertension    Unspecified  . LBBB (left bundle branch block)   . Macular degeneration (senile) of retina, unspecified   . Postsurgical aortocoronary bypass status   . Stroke (HCC)   . Transient global amnesia   . Unspecified hereditary and idiopathic peripheral neuropathy   . Unspecified vitamin D deficiency    Past Surgical History:  Procedure Laterality Date  . AORTIC VALVE REPLACEMENT    . APPENDECTOMY    . CORONARY ARTERY BYPASS GRAFT    . TONSILLECTOMY      Allergies  Allergen Reactions  . Codeine   . Penicillins     Outpatient Encounter Medications as of 07/06/2018  Medication Sig  . acetaminophen (TYLENOL) 325 MG tablet Take 2 tablets (650 mg total) by mouth every  6 (six) hours as needed for mild pain (or Fever >/= 101).  Marland Kitchen ALPRAZolam (XANAX) 0.25 MG tablet Take 1 tablet (0.25 mg total) by mouth at bedtime as needed for anxiety.  Marland Kitchen atorvastatin (LIPITOR) 40 MG tablet Take 1 tablet (40 mg total) by mouth daily.  . benazepril (LOTENSIN) 40 MG tablet TAKE (1) TABLET BY MOUTH ONCE DAILY.  . carvedilol (COREG) 6.25 MG tablet Take 6.25 mg by mouth 2 (two) times daily with a meal.  . docusate sodium (COLACE) 100  MG capsule Take 200 mg by mouth at bedtime.  . donepezil (ARICEPT) 10 MG tablet TAKE ONE TABLET BY MOUTH ONCE DAILY.  . ergocalciferol (VITAMIN D2) 50000 UNITS capsule Take 50,000 Units by mouth every Tuesday.   . furosemide (LASIX) 40 MG tablet Take 1 tablet (40 mg total) by mouth daily.  . hydrALAZINE (APRESOLINE) 10 MG tablet Take 1 tablet (10 mg total) by mouth every 8 (eight) hours. (Patient taking differently: Take 10 mg by mouth daily. )  . levalbuterol (XOPENEX) 0.63 MG/3ML nebulizer solution Take 3 mLs (0.63 mg total) by nebulization every 8 (eight) hours as needed for wheezing or shortness of breath.  . meclizine (ANTIVERT) 25 MG tablet Take 25 mg by mouth 3 (three) times daily as needed for dizziness.  . Melatonin 3 MG TABS Take 1 tablet (3 mg total) by mouth at bedtime as needed.  . ondansetron (ZOFRAN ODT) 4 MG disintegrating tablet Take 1 tablet (4 mg total) by mouth every 8 (eight) hours as needed for nausea or vomiting.  . potassium chloride (K-DUR,KLOR-CON) 10 MEQ tablet Take 1 tablet (10 mEq total) by mouth 2 (two) times daily.  . pregabalin (LYRICA) 75 MG capsule Take 1 capsule (75 mg total) by mouth every other day.  . umeclidinium-vilanterol (ANORO ELLIPTA) 62.5-25 MCG/INH AEPB Inhale 1 puff into the lungs every morning.  . warfarin (COUMADIN) 2.5 MG tablet Take 2.5 mg by mouth daily. Take on Sun., Mon., Tues., Wed., Thru., Fri.  . warfarin (COUMADIN) 5 MG tablet Take 5 mg by mouth. Take once a day on Sat.,  . promethazine (PHENERGAN) 12.5 MG tablet Take 12.5 mg by mouth every 6 (six) hours as needed for nausea or vomiting. Take for 24 hours from 8/18/-07/03/2018  . traMADol (ULTRAM) 50 MG tablet Take 1 tablet (50 mg total) by mouth every 12 (twelve) hours as needed for moderate pain or severe pain. (Patient not taking: Reported on 07/06/2018)   No facility-administered encounter medications on file as of 07/06/2018.     Review of Systems   General she not complaining of any  fever chills weight appears to be stable.  Skin is not complain of any rashes or itching.  Head ears eyes nose mouth and throat not complaining of visual changes or sore throat.  Respiratory is on chronic oxygen but does not complain of shortness of breath beyond baseline or increased cough.  Cardiac is not complaining of chest pain appears to have slight edema.  GI is not complaining of abdominal pain nausea vomiting diarrhea constipation.  Muscular skeletal is not really complaining of joint pain today appears to have been gaining some strength.  Neurologic is not complaining of headache at times will complain of some dizziness more so when she sits up or stands-this is intermittent however not persistent.  Psych does not complain of being anxious or depressed continues to be bright alert talkative.    Immunization History  Administered Date(s) Administered  . Influenza-Unspecified 08/21/2015  .  Pneumococcal Conjugate-13 08/21/2014  . Pneumococcal-Unspecified 08/21/2015  . Tdap 11/12/2013  . Zoster 08/31/2012   Pertinent  Health Maintenance Due  Topic Date Due  . INFLUENZA VACCINE  08/03/2018 (Originally 06/15/2018)  . DEXA SCAN  08/03/2018 (Originally 11/02/1993)  . PNA vac Low Risk Adult  Completed   No flowsheet data found. Functional Status Survey:    Vitals:   07/06/18 1328  BP: (!) 104/44  Pulse: 80  Resp: 17  Temp: 98.2 F (36.8 C)  TempSrc: Oral  SpO2: 99%  Weight: 101 lb 9.6 oz (46.1 kg)  Height: 5\' 5"  (1.651 m)   Body mass index is 16.91 kg/m. Physical Exam  Wt: 101.6 BP: 104/44, P: 80, RR: 17, T: 98.2, SpO2: 99%  In general this is a very pleasant well-nourished alert elderly female in no distress.  Skin is warm and dry.  Eyes visual acuity appears to be intact sclera and conjunctive are clear.  Chest is clear to auscultation there is no labored breathing.  Breath sounds are somewhat reduced  Heart is regular rate and rhythm with a baseline  click--she has very scant lower extremity edema.  Abdomen is soft nontender with positive bowel sounds.  Musculoskeletal does move all extremities at baseline is able to stand without assistance but still somewhat weak strength appears to be intact all 4 extremities.  Neurologic is grossly intact her speech is clear no lateralizing findings.  Psych she is largely alert and oriented very pleasant and appropriate Labs reviewed: Recent Labs    06/27/18 0018  07/01/18 0702 07/02/18 1147 07/03/18 0700  NA  --    < > 143 141 142  K  --    < > 4.4 4.1 3.8  CL  --    < > 104 102 99  CO2  --    < > 30 31 34*  GLUCOSE  --    < > 91 111* 87  BUN  --    < > 36* 33* 33*  CREATININE  --    < > 0.99 1.03* 1.07*  CALCIUM  --    < > 9.0 8.8* 9.3  MG 1.9  --   --   --   --    < > = values in this interval not displayed.   Recent Labs    06/26/18 1935 07/02/18 1147  AST 36 26  ALT 24 19  ALKPHOS 73 70  BILITOT 0.7 0.6  PROT 6.5 6.4*  ALBUMIN 3.5 3.2*   Recent Labs    06/26/18 1935  06/30/18 0610 07/02/18 1147 07/03/18 0700  WBC 8.9   < > 9.5 10.0 9.8  NEUTROABS 7.0  --   --  7.8* 7.1  HGB 9.0*   < > 9.5* 9.3* 9.6*  HCT 29.3*   < > 31.3* 30.5* 31.2*  MCV 98.0   < > 98.7 98.4 98.1  PLT 193   < > 233 267 297   < > = values in this interval not displayed.   Lab Results  Component Value Date   TSH 2.079 09/11/2014   No results found for: HGBA1C Lab Results  Component Value Date   CHOL 151 07/03/2018   HDL 56 07/03/2018   LDLCALC 84 07/03/2018   TRIG 57 07/03/2018   CHOLHDL 2.7 07/03/2018    Significant Diagnostic Results in last 30 days:  Dg Chest 2 View  Result Date: 06/26/2018 CLINICAL DATA:  Weakness, right hip pain EXAM: CHEST - 2 VIEW COMPARISON:  03/10/2017, CT chest 03/28/2018 FINDINGS: Sternotomy changes. Patient is rotated. Small bilateral pleural effusions. Hazy airspace disease at the bases. Cardiomegaly with vascular congestion and mild diffuse interstitial  opacity consistent with edema. Aortic atherosclerosis. No pneumothorax. Old right-sided rib deformities. Emphysematous disease. IMPRESSION: 1. Cardiomegaly with vascular congestion, mild interstitial pulmonary edema, and small bilateral pleural effusions. 2. Hazy airspace disease at the bases may reflect atelectasis or edema. Electronically Signed   By: Jasmine Pang M.D.   On: 06/26/2018 20:11   Ct Hip Right Wo Contrast  Result Date: 06/26/2018 CLINICAL DATA:  Right hip pain and shortness of breath. Abnormal radiographs of right hip. EXAM: CT OF THE RIGHT HIP WITHOUT CONTRAST TECHNIQUE: Multidetector CT imaging of the right hip was performed according to the standard protocol. Multiplanar CT image reconstructions were also generated. COMPARISON:  Right hip radiographs 06/26/2018 FINDINGS: Bones/Joint/Cartilage Degenerative changes in the right hip with osteophytes on the acetabular and femoral surfaces. No evidence of acute fracture or dislocation of the right hip. Plain radiographic finding of cortical step-off likely represented osteophyte formation. Old healed fracture deformities are demonstrated in the right superior and inferior pubic rami. There is a small right hip joint effusion. Ligaments Suboptimally assessed by CT. Muscles and Tendons Mild muscle atrophy. No intramuscular mass or hematoma suggested. Soft tissues The bladder is distended, possibly physiologic or possibly due to urinary retention. Vascular calcifications are present. There is a small right inguinal hernia containing small bowel without apparent proximal obstruction. IMPRESSION: No acute fracture or dislocation of the right hip. Degenerative changes in the right hip. Small right hip joint effusion. Small right inguinal hernia containing small bowel without apparent proximal obstruction. Electronically Signed   By: Burman Nieves M.D.   On: 06/26/2018 23:56   Dg Abd Acute W/chest  Result Date: 07/02/2018 CLINICAL DATA:  Nausea  vomiting for 2 days. Aortic valve replacement. Ex-smoker. EXAM: DG ABDOMEN ACUTE W/ 1V CHEST COMPARISON:  Chest radiograph 06/26/2018 FINDINGS: Frontal view of the chest demonstrates midline trachea. Patient rotated to the right. Moderate cardiomegaly. Prior median sternotomy. Atherosclerosis in the transverse aorta. Artifact projecting over the right lung base, without consolidation on upright abdominal image. No pleural effusion or pneumothorax. No congestive failure. Chronic interstitial thickening. Left base scarring. Osteopenia. Abdominal films demonstrate no free intraperitoneal air on upright positioning. Numerous leads and wires project over the upper abdomen and lower chest. No gaseous distention of bowel loops on supine imaging. Aortic atherosclerosis. IMPRESSION: No acute findings. Cardiomegaly without congestive failure. Aortic Atherosclerosis (ICD10-I70.0). Electronically Signed   By: Jeronimo Greaves M.D.   On: 07/02/2018 13:15   Dg Hip Unilat With Pelvis 2-3 Views Right  Result Date: 06/26/2018 CLINICAL DATA:  Right hip and groin pain after a fall EXAM: DG HIP (WITH OR WITHOUT PELVIS) 2-3V RIGHT COMPARISON:  None. FINDINGS: Limited evaluation of the right femoral neck due to superimposition of trochanter. Possible step-off deformity at the right femoral head neck junction. Old appearing fracture deformity of the right superior and inferior pubic rami. IMPRESSION: 1. Limited evaluation of femoral neck due to positioning, questionable step-off deformity at the right femoral head neck junction, suggest CT to better evaluate. 2. Old appearing fractures of the right superior and inferior pubic rami. Electronically Signed   By: Jasmine Pang M.D.   On: 06/26/2018 20:13    Assessment/Plan #1- hypertension- she continues on benazepril 40 mg a day Coreg 6.25 mg twice daily in addition to hydralazine 10 mg 3 times daily with orders to hold  for low systolic blood pressures- it appears her blood pressure does  drop fairly significantly with hydralazine-will discontinue this and maintain an order to give it if systolic blood pressures greater than 160-continue to monitor.  2.  Diastolic CHF her weight and edema appears to be well controlled-she is on Lasix 4 mg a day with potassium supplementation last metabolic panel showed stability of renal function and electrolytes update has been ordered for August 26.  3.-  Coronary artery disease this appears stable again on his aspirin as well as Prill Coreg she is also on a statin.  4r history of aortic valve replacement on chronic Coumadin INR is 2.9 on most recent lab update has been ordered for August 26- she is on her baseline dose- 2.5 mg every day except 5 mg on Saturdays.  [id number]

## 2018-07-09 ENCOUNTER — Other Ambulatory Visit (HOSPITAL_COMMUNITY): Payer: Self-pay | Admitting: *Deleted

## 2018-07-09 ENCOUNTER — Other Ambulatory Visit (HOSPITAL_COMMUNITY): Payer: Medicare Other

## 2018-07-10 ENCOUNTER — Non-Acute Institutional Stay (SKILLED_NURSING_FACILITY): Payer: Medicare Other | Admitting: Internal Medicine

## 2018-07-10 ENCOUNTER — Encounter (HOSPITAL_COMMUNITY)
Admission: RE | Admit: 2018-07-10 | Discharge: 2018-07-10 | Disposition: A | Discharge status: Home / Self Care | Payer: Medicare Other | Source: Skilled Nursing Facility | Attending: *Deleted | Admitting: *Deleted

## 2018-07-10 ENCOUNTER — Other Ambulatory Visit: Payer: Self-pay

## 2018-07-10 ENCOUNTER — Encounter: Payer: Self-pay | Admitting: Internal Medicine

## 2018-07-10 DIAGNOSIS — I5031 Acute diastolic (congestive) heart failure: Secondary | ICD-10-CM | POA: Diagnosis not present

## 2018-07-10 DIAGNOSIS — I5033 Acute on chronic diastolic (congestive) heart failure: Secondary | ICD-10-CM

## 2018-07-10 DIAGNOSIS — I10 Essential (primary) hypertension: Secondary | ICD-10-CM

## 2018-07-10 DIAGNOSIS — Z952 Presence of prosthetic heart valve: Secondary | ICD-10-CM

## 2018-07-10 DIAGNOSIS — D649 Anemia, unspecified: Secondary | ICD-10-CM

## 2018-07-10 LAB — CBC WITH DIFFERENTIAL/PLATELET
Basophils Absolute: 0 10*3/uL (ref 0.0–0.1)
Basophils Relative: 0 %
EOS PCT: 2 %
Eosinophils Absolute: 0.2 10*3/uL (ref 0.0–0.7)
HCT: 27 % — ABNORMAL LOW (ref 36.0–46.0)
Hemoglobin: 8.2 g/dL — ABNORMAL LOW (ref 12.0–15.0)
LYMPHS ABS: 1.5 10*3/uL (ref 0.7–4.0)
Lymphocytes Relative: 21 %
MCH: 29.8 pg (ref 26.0–34.0)
MCHC: 30.4 g/dL (ref 30.0–36.0)
MCV: 98.2 fL (ref 78.0–100.0)
MONO ABS: 0.9 10*3/uL (ref 0.1–1.0)
MONOS PCT: 12 %
Neutro Abs: 4.7 10*3/uL (ref 1.7–7.7)
Neutrophils Relative %: 65 %
PLATELETS: 279 10*3/uL (ref 150–400)
RBC: 2.75 MIL/uL — ABNORMAL LOW (ref 3.87–5.11)
RDW: 13.2 % (ref 11.5–15.5)
WBC: 7.4 10*3/uL (ref 4.0–10.5)

## 2018-07-10 LAB — OCCULT BLOOD X 1 CARD TO LAB, STOOL: Fecal Occult Bld: NEGATIVE

## 2018-07-10 LAB — BASIC METABOLIC PANEL
Anion gap: 6 (ref 5–15)
BUN: 35 mg/dL — ABNORMAL HIGH (ref 8–23)
CHLORIDE: 104 mmol/L (ref 98–111)
CO2: 32 mmol/L (ref 22–32)
Calcium: 9 mg/dL (ref 8.9–10.3)
Creatinine, Ser: 1.28 mg/dL — ABNORMAL HIGH (ref 0.44–1.00)
GFR calc Af Amer: 42 mL/min — ABNORMAL LOW (ref >60)
GFR calc non Af Amer: 36 mL/min — ABNORMAL LOW (ref >60)
Glucose, Bld: 88 mg/dL (ref 70–99)
Potassium: 4.5 mmol/L (ref 3.5–5.1)
Sodium: 142 mmol/L (ref 135–145)

## 2018-07-10 LAB — PROTIME-INR
INR: 2.32
Prothrombin Time: 25.3 seconds — ABNORMAL HIGH (ref 11.4–15.2)

## 2018-07-10 MED ORDER — PREGABALIN 75 MG PO CAPS: 75.0000 mg | ORAL_CAPSULE | ORAL | 0 refills | Status: DC

## 2018-07-10 NOTE — Progress Notes (Signed)
Location:    [facility] Room Number: 129/P Place of Service:  SNF [id number]) Provider:  Laurette Schimke, MD  Patient Care Team: Kari Baars, MD as PCP - General (Internal Medicine)  Extended Emergency Contact Information Primary Emergency Contact: Lisbeth Ply Brett Canales Address: [address]          [place], [place] [address] [place] Home Phone: [phone] Work Phone: [phone] Mobile Phone: [phone] Relation: Other Secondary Emergency Contact: Greer Ee Mobile Phone: [phone] Relation: Granddaughter Preferred language: English Interpreter needed? No  Code Status:  Full Code Goals of care: Advanced Directive information Advanced Directives 07/10/2018  Does Patient Have a Medical Advance Directive? Yes  Type of Advance Directive (No Data)  Does patient want to make changes to medical advance directive? No - Patient declined  Copy of Healthcare Power of Attorney in Chart? -  Pre-existing out of facility DNR order (yellow form or pink MOST form) -     Chief Complaint  Patient presents with  . Acute Visit    Patient is being seen for Anemia    HPI:  Pt is a 84 y.o. female seen today for an acute visit for follow-up of anemia-also mild renal insufficiency  Patient is here for rehab after hospitalization for diastolic CHF.  She also has a history of coronary artery disease as well as left bundle branch block- , AVRon Chronic Coumadin, Hyperlipidemia, Hypertension,Macular degeneration  She fell at home was brought to the ED did not have any acute fracture-but she was found to be in diastolic CHF- Lasix was he increased to 40 mg a day she was started on hydralazine for elevated blood pressures-she is also on Coreg and benazepril.  She continues to do well the facility.  She appears to be gaining strength-.  Labs today did show her hemoglobin has dropped somewhat down to 8.2.  It appears baseline is  more in the nines although recently she was in the higher  8 range  She does have a listed history of anemia and per daughter has been on iron before but does not take this on a consistent  Basis  It appears her creatinine also is slowly rising is 1.28 on lab done today with a BUN of 35 previous creatinine was 1.07- she is on Lasix 40 mg a day-her weight appears to be stable 0.6 pounds she is actually lost a couple pounds since admission edema appears to be well controlled.  She is not complaining of shortness of breath and appears to be getting stronger  .       Past Medical History:  Diagnosis Date  . Carotid artery stenosis    Without infarction  . Coronary artery disease   . Heart valve replaced by other means   . Hypercholesterolemia    Pure  . Hypertension    Unspecified  . LBBB (left bundle branch block)   . Macular degeneration (senile) of retina, unspecified   . Postsurgical aortocoronary bypass status   . Stroke (HCC)   . Transient global amnesia   . Unspecified hereditary and idiopathic peripheral neuropathy   . Unspecified vitamin D deficiency    Past Surgical History:  Procedure Laterality Date  . AORTIC VALVE REPLACEMENT    . APPENDECTOMY    . CORONARY ARTERY BYPASS GRAFT    . TONSILLECTOMY      Allergies  Allergen Reactions  . Codeine   . Penicillins     Outpatient Encounter Medications  as of 07/10/2018  Medication Sig  . acetaminophen (TYLENOL) 325 MG tablet Take 2 tablets (650 mg total) by mouth every 6 (six) hours as needed for mild pain (or Fever >/= 101).  Marland Kitchen ALPRAZolam (XANAX) 0.25 MG tablet Take 1 tablet (0.25 mg total) by mouth at bedtime as needed for anxiety.  Marland Kitchen atorvastatin (LIPITOR) 40 MG tablet Take 1 tablet (40 mg total) by mouth daily.  . benazepril (LOTENSIN) 40 MG tablet TAKE (1) TABLET BY MOUTH ONCE DAILY.  . carvedilol (COREG) 6.25 MG tablet Take 6.25 mg by mouth 2 (two) times daily with a meal.  . docusate sodium (COLACE) 100 MG  capsule Take 200 mg by mouth at bedtime.  . donepezil (ARICEPT) 10 MG tablet TAKE ONE TABLET BY MOUTH ONCE DAILY.  . ergocalciferol (VITAMIN D2) 50000 UNITS capsule Take 50,000 Units by mouth every Tuesday.   . furosemide (LASIX) 40 MG tablet Take 1 tablet (40 mg total) by mouth daily.  . hydrALAZINE (APRESOLINE) 10 MG tablet Take 10 mg by mouth daily as needed.  . levalbuterol (XOPENEX) 0.63 MG/3ML nebulizer solution Take 3 mLs (0.63 mg total) by nebulization every 8 (eight) hours as needed for wheezing or shortness of breath.  . meclizine (ANTIVERT) 25 MG tablet Take 25 mg by mouth 3 (three) times daily as needed for dizziness.  . Melatonin 3 MG TABS Take 1 tablet (3 mg total) by mouth at bedtime as needed.  . ondansetron (ZOFRAN ODT) 4 MG disintegrating tablet Take 1 tablet (4 mg total) by mouth every 8 (eight) hours as needed for nausea or vomiting.  . potassium chloride (K-DUR,KLOR-CON) 10 MEQ tablet Take 1 tablet (10 mEq total) by mouth 2 (two) times daily.  Marland Kitchen umeclidinium-vilanterol (ANORO ELLIPTA) 62.5-25 MCG/INH AEPB Inhale 1 puff into the lungs every morning.  . warfarin (COUMADIN) 2.5 MG tablet Take 2.5 mg by mouth daily. Take on Sun., Mon., Tues., Wed., Thru., Fri.  . warfarin (COUMADIN) 5 MG tablet Take 5 mg by mouth. Take once a day on Sat.,  . [DISCONTINUED] pregabalin (LYRICA) 75 MG capsule Take 1 capsule (75 mg total) by mouth every other day.  . [DISCONTINUED] hydrALAZINE (APRESOLINE) 10 MG tablet Take 1 tablet (10 mg total) by mouth every 8 (eight) hours.  . [DISCONTINUED] promethazine (PHENERGAN) 12.5 MG tablet Take 12.5 mg by mouth every 6 (six) hours as needed for nausea or vomiting. Take for 24 hours from 8/18/-07/03/2018  . [DISCONTINUED] traMADol (ULTRAM) 50 MG tablet Take 1 tablet (50 mg total) by mouth every 12 (twelve) hours as needed for moderate pain or severe pain. (Patient not taking: Reported on 07/06/2018)   No facility-administered encounter medications on file as  of 07/10/2018.     Review of Systems   General she is not complaining any fever chills her weight appears to be stable.  Skin does not complain of diaphoresis or rash or itching.  Head ears eyes nose mouth and throat does not complain of sore throat or visual changes.  Respiratory continues on chronic oxygen is not complaining of shortness of breath this morning or increased cough.  Cardiac does not complain of chest pain edema is very scant.  GI does not complain of abdominal discomfort nausea vomiting diarrhea constipation.  Musculoskeletal appears to be gaining some strength is not complaining of joint pain currently.  Neurologic does not complain of headache at times will complain of dizziness especially when she sits up or stands fairly suddenly this is intermittent and apparently fairly chronic-she  does have diverted as needed.  Psych does not complain of being anxious or depressed appears to be in good spirits-she has quite a few visitors quite sociable  Immunization History  Administered Date(s) Administered  . Influenza-Unspecified 08/21/2015  . Pneumococcal Conjugate-13 08/21/2014  . Pneumococcal-Unspecified 08/21/2015  . Tdap 11/12/2013  . Zoster 08/31/2012   Pertinent  Health Maintenance Due  Topic Date Due  . INFLUENZA VACCINE  08/03/2018 (Originally 06/15/2018)  . DEXA SCAN  08/03/2018 (Originally 11/02/1993)  . PNA vac Low Risk Adult  Completed   No flowsheet data found. Functional Status Survey:    Vitals:   07/10/18 1535  BP: (!) 106/40  Pulse: 62  Resp: 18  SpO2: 94%  Weight is 100.6 pounds  Physical Exam   General this is a pleasant elderly female no distress.  Her skin is warm and dry.  Visual acuity appears to be intact sclera and conjunctive are clear.  Oropharynx is clear mucous membranes moist.  Chest is clear to auscultation with somewhat shallow air entry there is no labored breathing.  Heart is regular rate and rhythm without murmur  gallop or rub she has very minimal lower extremity edema.  Her abdomen is soft nontender with positive bowel sounds.  Rectal I did test for occult bleeding digitally it was negative but stool sample was very scant  Musculoskeletal does move all extremities x4 it appears at baseline appears to be gaining some strength here.  Neurologic is grossly intact her speech is clear no lateralizing findings.  Psych she is alert and oriented very pleasant and appropriate    Labs reviewed: Recent Labs    06/27/18 0018  07/02/18 1147 07/03/18 0700 07/10/18 0608  NA  --    < > 141 142 142  K  --    < > 4.1 3.8 4.5  CL  --    < > 102 99 104  CO2  --    < > 31 34* 32  GLUCOSE  --    < > 111* 87 88  BUN  --    < > 33* 33* 35*  CREATININE  --    < > 1.03* 1.07* 1.28*  CALCIUM  --    < > 8.8* 9.3 9.0  MG 1.9  --   --   --   --    < > = values in this interval not displayed.   Recent Labs    06/26/18 1935 07/02/18 1147  AST 36 26  ALT 24 19  ALKPHOS 73 70  BILITOT 0.7 0.6  PROT 6.5 6.4*  ALBUMIN 3.5 3.2*   Recent Labs    07/02/18 1147 07/03/18 0700 07/10/18 0608  WBC 10.0 9.8 7.4  NEUTROABS 7.8* 7.1 4.7  HGB 9.3* 9.6* 8.2*  HCT 30.5* 31.2* 27.0*  MCV 98.4 98.1 98.2  PLT 267 297 279   Lab Results  Component Value Date   TSH 2.079 09/11/2014   No results found for: HGBA1C Lab Results  Component Value Date   CHOL 151 07/03/2018   HDL 56 07/03/2018   LDLCALC 84 07/03/2018   TRIG 57 07/03/2018   CHOLHDL 2.7 07/03/2018    Significant Diagnostic Results in last 30 days:  Dg Chest 2 View  Result Date: 06/26/2018 CLINICAL DATA:  Weakness, right hip pain EXAM: CHEST - 2 VIEW COMPARISON:  03/10/2017, CT chest 03/28/2018 FINDINGS: Sternotomy changes. Patient is rotated. Small bilateral pleural effusions. Hazy airspace disease at the bases. Cardiomegaly with vascular congestion and  mild diffuse interstitial opacity consistent with edema. Aortic atherosclerosis. No pneumothorax.  Old right-sided rib deformities. Emphysematous disease. IMPRESSION: 1. Cardiomegaly with vascular congestion, mild interstitial pulmonary edema, and small bilateral pleural effusions. 2. Hazy airspace disease at the bases may reflect atelectasis or edema. Electronically Signed   By: Jasmine Pang M.D.   On: 06/26/2018 20:11   Ct Hip Right Wo Contrast  Result Date: 06/26/2018 CLINICAL DATA:  Right hip pain and shortness of breath. Abnormal radiographs of right hip. EXAM: CT OF THE RIGHT HIP WITHOUT CONTRAST TECHNIQUE: Multidetector CT imaging of the right hip was performed according to the standard protocol. Multiplanar CT image reconstructions were also generated. COMPARISON:  Right hip radiographs 06/26/2018 FINDINGS: Bones/Joint/Cartilage Degenerative changes in the right hip with osteophytes on the acetabular and femoral surfaces. No evidence of acute fracture or dislocation of the right hip. Plain radiographic finding of cortical step-off likely represented osteophyte formation. Old healed fracture deformities are demonstrated in the right superior and inferior pubic rami. There is a small right hip joint effusion. Ligaments Suboptimally assessed by CT. Muscles and Tendons Mild muscle atrophy. No intramuscular mass or hematoma suggested. Soft tissues The bladder is distended, possibly physiologic or possibly due to urinary retention. Vascular calcifications are present. There is a small right inguinal hernia containing small bowel without apparent proximal obstruction. IMPRESSION: No acute fracture or dislocation of the right hip. Degenerative changes in the right hip. Small right hip joint effusion. Small right inguinal hernia containing small bowel without apparent proximal obstruction. Electronically Signed   By: Burman Nieves M.D.   On: 06/26/2018 23:56   Dg Abd Acute W/chest  Result Date: 07/02/2018 CLINICAL DATA:  Nausea vomiting for 2 days. Aortic valve replacement. Ex-smoker. EXAM: DG ABDOMEN  ACUTE W/ 1V CHEST COMPARISON:  Chest radiograph 06/26/2018 FINDINGS: Frontal view of the chest demonstrates midline trachea. Patient rotated to the right. Moderate cardiomegaly. Prior median sternotomy. Atherosclerosis in the transverse aorta. Artifact projecting over the right lung base, without consolidation on upright abdominal image. No pleural effusion or pneumothorax. No congestive failure. Chronic interstitial thickening. Left base scarring. Osteopenia. Abdominal films demonstrate no free intraperitoneal air on upright positioning. Numerous leads and wires project over the upper abdomen and lower chest. No gaseous distention of bowel loops on supine imaging. Aortic atherosclerosis. IMPRESSION: No acute findings. Cardiomegaly without congestive failure. Aortic Atherosclerosis (ICD10-I70.0). Electronically Signed   By: Jeronimo Greaves M.D.   On: 07/02/2018 13:15   Dg Hip Unilat With Pelvis 2-3 Views Right  Result Date: 06/26/2018 CLINICAL DATA:  Right hip and groin pain after a fall EXAM: DG HIP (WITH OR WITHOUT PELVIS) 2-3V RIGHT COMPARISON:  None. FINDINGS: Limited evaluation of the right femoral neck due to superimposition of trochanter. Possible step-off deformity at the right femoral head neck junction. Old appearing fracture deformity of the right superior and inferior pubic rami. IMPRESSION: 1. Limited evaluation of femoral neck due to positioning, questionable step-off deformity at the right femoral head neck junction, suggest CT to better evaluate. 2. Old appearing fractures of the right superior and inferior pubic rami. Electronically Signed   By: Jasmine Pang M.D.   On: 06/26/2018 20:13    Assessment/Plan  #1- anemia- as noted above at one point she been on iron but apparently was somewhat sporadic in taking it-hemoglobin has dropped somewhat down to 8.2 baseline appears to be more in the higher eights nines range- we will start iron 325 mg a day- and will update a CBC with differential  as  well as iron studies B12 and folate ferritin and reticulocyte count later this week.  Also test for occult bleeding stools x3.  Bedside test today was negative   2 mild renal insufficiency creatinine is slowly rising her edema appears to be scant and well controlled weight is stable- will decrease her Lasix to 20 mg a day potassium down to 10 mEq a day and update a metabolic panel again later this week.  Continue to  monitor weights and clinical status.  #3 diastolic CHF-this appears well controlled currently again will reduce her Lasix because of renal issues and monitor- she continues on an ACE inhibitor as well  4  History of aortic valve replacement- she is on chronic Coumadin INR today is therapeutic at 2.32 goal is 2.2-3--at this point continue current Coumadin dose recheck later this week.  5-- hypertension she is on Coreg and an ACE inhibitor she does have hydralazine as needed but has not really needed this- at this point continue to monitor blood pressure today 106/40- previous  listedblood pressures 120/52-123/60-  [id number]--of note greater than 35 minutes spent assessing patient-reviewing her chart and labs- discussing her status with her daughter at bedside- coordinating and formulating a plan of care-of note greater than 50% of time spent coordinating a plan of care with input as noted above

## 2018-07-10 NOTE — Telephone Encounter (Signed)
RX Fax for [facility]@ [phone]  

## 2018-07-11 ENCOUNTER — Other Ambulatory Visit (HOSPITAL_COMMUNITY)
Admission: RE | Admit: 2018-07-11 | Discharge: 2018-07-11 | Disposition: A | Discharge status: Home / Self Care | Payer: Medicare Other | Source: Other Acute Inpatient Hospital | Attending: Internal Medicine | Admitting: Internal Medicine

## 2018-07-11 DIAGNOSIS — D649 Anemia, unspecified: Secondary | ICD-10-CM | POA: Insufficient documentation

## 2018-07-11 LAB — OCCULT BLOOD X 1 CARD TO LAB, STOOL
FECAL OCCULT BLD: NEGATIVE
FECAL OCCULT BLD: NEGATIVE

## 2018-07-12 ENCOUNTER — Encounter (HOSPITAL_COMMUNITY)
Admission: RE | Admit: 2018-07-12 | Discharge: 2018-07-12 | Disposition: A | Discharge status: Home / Self Care | Payer: Medicare Other | Source: Skilled Nursing Facility | Attending: Internal Medicine | Admitting: Internal Medicine

## 2018-07-12 DIAGNOSIS — J9611 Chronic respiratory failure with hypoxia: Secondary | ICD-10-CM | POA: Insufficient documentation

## 2018-07-12 DIAGNOSIS — I5032 Chronic diastolic (congestive) heart failure: Secondary | ICD-10-CM | POA: Insufficient documentation

## 2018-07-12 DIAGNOSIS — M6281 Muscle weakness (generalized): Secondary | ICD-10-CM | POA: Insufficient documentation

## 2018-07-12 DIAGNOSIS — D649 Anemia, unspecified: Secondary | ICD-10-CM | POA: Insufficient documentation

## 2018-07-12 DIAGNOSIS — J441 Chronic obstructive pulmonary disease with (acute) exacerbation: Secondary | ICD-10-CM | POA: Insufficient documentation

## 2018-07-12 DIAGNOSIS — Z952 Presence of prosthetic heart valve: Secondary | ICD-10-CM | POA: Insufficient documentation

## 2018-07-12 LAB — BASIC METABOLIC PANEL
ANION GAP: 7 (ref 5–15)
BUN: 30 mg/dL — AB (ref 8–23)
CHLORIDE: 103 mmol/L (ref 98–111)
CO2: 32 mmol/L (ref 22–32)
Calcium: 9.2 mg/dL (ref 8.9–10.3)
Creatinine, Ser: 1.09 mg/dL — ABNORMAL HIGH (ref 0.44–1.00)
GFR, EST AFRICAN AMERICAN: 51 mL/min — AB (ref >60)
GFR, EST NON AFRICAN AMERICAN: 44 mL/min — AB (ref >60)
Glucose, Bld: 83 mg/dL (ref 70–99)
Potassium: 4 mmol/L (ref 3.5–5.1)
SODIUM: 142 mmol/L (ref 135–145)

## 2018-07-12 LAB — CBC WITH DIFFERENTIAL/PLATELET
BASOS ABS: 0 10*3/uL (ref 0.0–0.1)
BASOS PCT: 0 %
EOS PCT: 3 %
Eosinophils Absolute: 0.2 10*3/uL (ref 0.0–0.7)
HCT: 28.3 % — ABNORMAL LOW (ref 36.0–46.0)
HEMOGLOBIN: 8.6 g/dL — AB (ref 12.0–15.0)
Lymphocytes Relative: 29 %
Lymphs Abs: 2.1 10*3/uL (ref 0.7–4.0)
MCH: 29.7 pg (ref 26.0–34.0)
MCHC: 30.4 g/dL (ref 30.0–36.0)
MCV: 97.6 fL (ref 78.0–100.0)
Monocytes Absolute: 0.8 10*3/uL (ref 0.1–1.0)
Monocytes Relative: 11 %
NEUTROS PCT: 57 %
Neutro Abs: 4.1 10*3/uL (ref 1.7–7.7)
PLATELETS: 322 10*3/uL (ref 150–400)
RBC: 2.9 MIL/uL — AB (ref 3.87–5.11)
RDW: 13.8 % (ref 11.5–15.5)
WBC: 7.2 10*3/uL (ref 4.0–10.5)

## 2018-07-12 LAB — VITAMIN B12: Vitamin B-12: 220 pg/mL (ref 180–914)

## 2018-07-12 LAB — PROTIME-INR
INR: 2.7
PROTHROMBIN TIME: 28.5 s — AB (ref 11.4–15.2)

## 2018-07-12 LAB — RETICULOCYTES
RBC.: 2.9 MIL/uL — ABNORMAL LOW (ref 3.87–5.11)
RETIC CT PCT: 1 % (ref 0.4–3.1)
Retic Count, Absolute: 29 10*3/uL (ref 19.0–186.0)

## 2018-07-12 LAB — IRON AND TIBC
IRON: 39 ug/dL (ref 28–170)
SATURATION RATIOS: 11 % (ref 10.4–31.8)
TIBC: 357 ug/dL (ref 250–450)
UIBC: 318 ug/dL

## 2018-07-12 LAB — FOLATE: FOLATE: 30.8 ng/mL (ref >5.9)

## 2018-07-12 LAB — FERRITIN: Ferritin: 36 ng/mL (ref 11–307)

## 2018-07-13 ENCOUNTER — Other Ambulatory Visit: Payer: Self-pay | Admitting: *Deleted

## 2018-07-13 ENCOUNTER — Encounter: Payer: Self-pay | Admitting: *Deleted

## 2018-07-13 NOTE — Patient Outreach (Signed)
[name]) Care Management  07/13/2018  [name] 01-Feb-1926 [id number]  Met with patient, her daughter [name] and grand daughter [name] at onsite visit to [facility] to follow up on [facility] care management services information discussed with patient at last facility visit.   Patient reports she is set to go home on Tuesday 07/18/18.  Patient also has her follow up appointment with Dr. Luan Pulling on 07/18/18 at noon.  Daughter verbalizes that she and patient are both concerned about being ready to discharge. She is trying to line up more private pay help and would love to have more resources.  Daughter and granddaughter asking if aide services are covered by Medicare. RNCM was able to explain the differences in custodial and skilled services. They both verbalized understanding.  They will have home care services. They are meeting with SW today for a discharge planning meeting and will get more details at the meeting in a few minutes.   RNCM reviewed [facility] care management again with patient, daughter-[name], and [employer].  Patient verbally agrees to [facility] services she requests that calls be made to daughter [name], her contact is [phone] and [phone]. Patient and family would like to have social work, Building surveyor and pharmacy [facility] services. The feel that they need BSW to help with community resources-specifically any agencies for aide services, RNCM for transition of care and to coordinate care, pharmacist to help with medication education and management.   Plan to refer to [place] for community resources (agency and Scientific laboratory technician) RNCM community for transition of care Pharmacist for medication reconciliation, management, and education.   Will collaborate with [facility] CM and [facility] as indicated.  Will let SW at facility know of [facility] involvement as they were in care plan meetings and this RNCM was unable to meet with them in person.   [name].  [name], RN, BSN, [place] [phone]) Business Cell  [phone]) Toll Free Office

## 2018-07-14 ENCOUNTER — Other Ambulatory Visit: Payer: Self-pay

## 2018-07-14 ENCOUNTER — Encounter (HOSPITAL_COMMUNITY)
Admission: RE | Admit: 2018-07-14 | Discharge: 2018-07-14 | Disposition: A | Discharge status: Home / Self Care | Payer: Medicare Other | Source: Skilled Nursing Facility | Attending: Internal Medicine | Admitting: Internal Medicine

## 2018-07-14 DIAGNOSIS — J441 Chronic obstructive pulmonary disease with (acute) exacerbation: Secondary | ICD-10-CM | POA: Insufficient documentation

## 2018-07-14 DIAGNOSIS — M6281 Muscle weakness (generalized): Secondary | ICD-10-CM | POA: Insufficient documentation

## 2018-07-14 DIAGNOSIS — I5032 Chronic diastolic (congestive) heart failure: Secondary | ICD-10-CM | POA: Insufficient documentation

## 2018-07-14 DIAGNOSIS — Z952 Presence of prosthetic heart valve: Secondary | ICD-10-CM | POA: Insufficient documentation

## 2018-07-14 DIAGNOSIS — J9611 Chronic respiratory failure with hypoxia: Secondary | ICD-10-CM | POA: Insufficient documentation

## 2018-07-14 LAB — PROTIME-INR
INR: 2.87
PROTHROMBIN TIME: 29.9 s — AB (ref 11.4–15.2)

## 2018-07-14 NOTE — Patient Outreach (Signed)
[facility]) Care Management  07/14/2018  [name] Sep 19, 1926 [id number]  Successful call to the patients daughter, [name], who is the patients primary caregiver and main point of contact. The patients HIPAA identifiers were identified by [name]. BSW introduced self and the reason for today's call. BSW discussed the patients recent social work referral by [facility] post acute navigator, [employer]. [name] shared that the patient has a planned discharge arranged for Tuesday September 3rd from [facility]. [name] is concerned she will not have things in place for the patient upon arriving home.  At this time, [name] has arranged a private caregiver to assist Monday - Friday from 3:00 pm - 5:00 am. This caregiver has assisted the patient in the past for a few hours 3 days per week and is aware of the patients needs. [name]  lives under 10 miles from the patient but is wanting to arrange a caregiver to assist between the hours of 5:00 am - 3:00 pm. Unfortunately, [name]'s sister lives in [place] and is limited in terms of availability to help with the patients care needs.  At this time the patient has the following equipment available and in the home: oxygen, bedside commode, shower chair, and walker. [name] indicates the patient will need a wheelchair upon returning home. [name] states [facility] is planning to order this DME equipment in efforts to have it available and in place upon discharge. [name] is concerned the patient may need rails added to her bed to assist with mobility. The patient has a traditional home-style bed. The patient will receive home health nursing and therapy through [facility]. BSW encouraged [name] to notify the therapist of her concern regarding the need for bed-rails. BSW explained that the therapist can assess the patients current room set-up and assist with education of the appropriate styled rails. BSW further explained the  therapists role in training the patient to utilize such equipment if it is needed.  [name] indicated "someone" is scheduled to conduct a home safety assessment. [name] is unsure of who and which agency will conduct this assessment stating "I have been given so much information I can not keep up with it". [name] further stated this person will look for potential fall hazards and possible home modifications needed for the patient to remain in the home.  During today's call, [name] is overwhelmed and concerned she will not have necessary resources in place for her mothers return. BSW encouraged [name] and praised her for the arrangements she has made thus far. BSW provided [name] with the number to [employer] in efforts to access their caregiver program. BSW also explained this program can assist with transportation or mobile meals if desired. BSW discussed the LEAF program as an option. [name] is interested in more information regarding this program.  Prior to ending today's call, BSW explained that the patient will be followed by [employer] Chrismon who is currently out of the office. BSW provided [name] with [facility] Care Management office number should assistance be needed prior to Mrs. Chrismon contacting the patient. [name] also acknowledged she received the [facility] Welcome Packet by Verdie Drown, [facility] post acute navigator. BSW reminded [name] of the 24-hour nurse line should questions arise after hours.  Plan: [name] to contact [employer] to request more information regarding the caregiver program. BSW to mail information on the LEAF program as well as a list of home care provider agencies for [facility]. [name] requested this information be mailed to  her [place] [place] [phone]) BSW to update Mrs. Chrismon on today's call.  Bevelyn NgoKendra Henli Hey, BSW, CDP [facility] Care Management Social Worker [phone]

## 2018-07-16 ENCOUNTER — Other Ambulatory Visit: Payer: Self-pay

## 2018-07-16 ENCOUNTER — Encounter: Payer: Self-pay | Admitting: Internal Medicine

## 2018-07-16 ENCOUNTER — Inpatient Hospital Stay (HOSPITAL_COMMUNITY): Payer: Medicare Other | Attending: Internal Medicine

## 2018-07-16 ENCOUNTER — Emergency Department (HOSPITAL_COMMUNITY): Payer: Medicare Other

## 2018-07-16 ENCOUNTER — Encounter (HOSPITAL_COMMUNITY)
Admission: AD | Admit: 2018-07-16 | Discharge: 2018-07-16 | Disposition: A | Discharge status: Home / Self Care | Payer: Medicare Other | Source: Skilled Nursing Facility | Attending: Internal Medicine | Admitting: Internal Medicine

## 2018-07-16 ENCOUNTER — Encounter (HOSPITAL_COMMUNITY): Payer: Self-pay

## 2018-07-16 ENCOUNTER — Inpatient Hospital Stay (HOSPITAL_COMMUNITY): Payer: Medicare Other

## 2018-07-16 ENCOUNTER — Non-Acute Institutional Stay (SKILLED_NURSING_FACILITY): Payer: Medicare Other | Admitting: Internal Medicine

## 2018-07-16 ENCOUNTER — Inpatient Hospital Stay (HOSPITAL_COMMUNITY)
Admission: EM | Admit: 2018-07-16 | Discharge: 2018-07-21 | DRG: 480 | Disposition: A | Discharge status: Skilled Nursing Facility | Payer: Medicare Other | Attending: Internal Medicine | Admitting: Internal Medicine

## 2018-07-16 DIAGNOSIS — Z79899 Other long term (current) drug therapy: Secondary | ICD-10-CM | POA: Diagnosis not present

## 2018-07-16 DIAGNOSIS — I5033 Acute on chronic diastolic (congestive) heart failure: Secondary | ICD-10-CM | POA: Diagnosis not present

## 2018-07-16 DIAGNOSIS — Z951 Presence of aortocoronary bypass graft: Secondary | ICD-10-CM | POA: Diagnosis not present

## 2018-07-16 DIAGNOSIS — W1839XA Other fall on same level, initial encounter: Secondary | ICD-10-CM | POA: Diagnosis not present

## 2018-07-16 DIAGNOSIS — T148XXA Other injury of unspecified body region, initial encounter: Secondary | ICD-10-CM

## 2018-07-16 DIAGNOSIS — D649 Anemia, unspecified: Secondary | ICD-10-CM | POA: Diagnosis not present

## 2018-07-16 DIAGNOSIS — E785 Hyperlipidemia, unspecified: Secondary | ICD-10-CM | POA: Diagnosis present

## 2018-07-16 DIAGNOSIS — Z8673 Personal history of transient ischemic attack (TIA), and cerebral infarction without residual deficits: Secondary | ICD-10-CM | POA: Diagnosis not present

## 2018-07-16 DIAGNOSIS — D631 Anemia in chronic kidney disease: Secondary | ICD-10-CM | POA: Diagnosis present

## 2018-07-16 DIAGNOSIS — I25700 Atherosclerosis of coronary artery bypass graft(s), unspecified, with unstable angina pectoris: Secondary | ICD-10-CM | POA: Diagnosis not present

## 2018-07-16 DIAGNOSIS — Z01818 Encounter for other preprocedural examination: Secondary | ICD-10-CM | POA: Diagnosis not present

## 2018-07-16 DIAGNOSIS — S72011A Unspecified intracapsular fracture of right femur, initial encounter for closed fracture: Principal | ICD-10-CM | POA: Diagnosis present

## 2018-07-16 DIAGNOSIS — J9611 Chronic respiratory failure with hypoxia: Secondary | ICD-10-CM | POA: Diagnosis not present

## 2018-07-16 DIAGNOSIS — Z823 Family history of stroke: Secondary | ICD-10-CM | POA: Diagnosis not present

## 2018-07-16 DIAGNOSIS — I5022 Chronic systolic (congestive) heart failure: Secondary | ICD-10-CM | POA: Diagnosis not present

## 2018-07-16 DIAGNOSIS — I5032 Chronic diastolic (congestive) heart failure: Secondary | ICD-10-CM | POA: Diagnosis not present

## 2018-07-16 DIAGNOSIS — E7800 Pure hypercholesterolemia, unspecified: Secondary | ICD-10-CM | POA: Diagnosis present

## 2018-07-16 DIAGNOSIS — S72001D Fracture of unspecified part of neck of right femur, subsequent encounter for closed fracture with routine healing: Secondary | ICD-10-CM | POA: Diagnosis not present

## 2018-07-16 DIAGNOSIS — Z681 Body mass index (BMI) 19 or less, adult: Secondary | ICD-10-CM

## 2018-07-16 DIAGNOSIS — I4891 Unspecified atrial fibrillation: Secondary | ICD-10-CM | POA: Diagnosis present

## 2018-07-16 DIAGNOSIS — I25810 Atherosclerosis of coronary artery bypass graft(s) without angina pectoris: Secondary | ICD-10-CM | POA: Diagnosis present

## 2018-07-16 DIAGNOSIS — M810 Age-related osteoporosis without current pathological fracture: Secondary | ICD-10-CM | POA: Diagnosis present

## 2018-07-16 DIAGNOSIS — R262 Difficulty in walking, not elsewhere classified: Secondary | ICD-10-CM | POA: Diagnosis not present

## 2018-07-16 DIAGNOSIS — N179 Acute kidney failure, unspecified: Secondary | ICD-10-CM | POA: Diagnosis present

## 2018-07-16 DIAGNOSIS — I429 Cardiomyopathy, unspecified: Secondary | ICD-10-CM | POA: Diagnosis present

## 2018-07-16 DIAGNOSIS — Y92009 Unspecified place in unspecified non-institutional (private) residence as the place of occurrence of the external cause: Secondary | ICD-10-CM | POA: Diagnosis not present

## 2018-07-16 DIAGNOSIS — J441 Chronic obstructive pulmonary disease with (acute) exacerbation: Secondary | ICD-10-CM | POA: Diagnosis not present

## 2018-07-16 DIAGNOSIS — Z952 Presence of prosthetic heart valve: Secondary | ICD-10-CM

## 2018-07-16 DIAGNOSIS — E8889 Other specified metabolic disorders: Secondary | ICD-10-CM | POA: Diagnosis present

## 2018-07-16 DIAGNOSIS — E43 Unspecified severe protein-calorie malnutrition: Secondary | ICD-10-CM | POA: Diagnosis present

## 2018-07-16 DIAGNOSIS — I130 Hypertensive heart and chronic kidney disease with heart failure and stage 1 through stage 4 chronic kidney disease, or unspecified chronic kidney disease: Secondary | ICD-10-CM | POA: Diagnosis present

## 2018-07-16 DIAGNOSIS — M6281 Muscle weakness (generalized): Secondary | ICD-10-CM | POA: Diagnosis not present

## 2018-07-16 DIAGNOSIS — R0989 Other specified symptoms and signs involving the circulatory and respiratory systems: Secondary | ICD-10-CM | POA: Diagnosis not present

## 2018-07-16 DIAGNOSIS — Z885 Allergy status to narcotic agent status: Secondary | ICD-10-CM

## 2018-07-16 DIAGNOSIS — I10 Essential (primary) hypertension: Secondary | ICD-10-CM | POA: Diagnosis not present

## 2018-07-16 DIAGNOSIS — F0390 Unspecified dementia without behavioral disturbance: Secondary | ICD-10-CM | POA: Diagnosis present

## 2018-07-16 DIAGNOSIS — N183 Chronic kidney disease, stage 3 (moderate): Secondary | ICD-10-CM | POA: Diagnosis present

## 2018-07-16 DIAGNOSIS — Z419 Encounter for procedure for purposes other than remedying health state, unspecified: Secondary | ICD-10-CM

## 2018-07-16 DIAGNOSIS — S3992XA Unspecified injury of lower back, initial encounter: Secondary | ICD-10-CM | POA: Diagnosis not present

## 2018-07-16 DIAGNOSIS — M2550 Pain in unspecified joint: Secondary | ICD-10-CM | POA: Diagnosis not present

## 2018-07-16 DIAGNOSIS — Z9181 History of falling: Secondary | ICD-10-CM | POA: Diagnosis not present

## 2018-07-16 DIAGNOSIS — H3530 Unspecified macular degeneration: Secondary | ICD-10-CM | POA: Diagnosis present

## 2018-07-16 DIAGNOSIS — R2689 Other abnormalities of gait and mobility: Secondary | ICD-10-CM | POA: Diagnosis not present

## 2018-07-16 DIAGNOSIS — I5043 Acute on chronic combined systolic (congestive) and diastolic (congestive) heart failure: Secondary | ICD-10-CM | POA: Diagnosis present

## 2018-07-16 DIAGNOSIS — Z66 Do not resuscitate: Secondary | ICD-10-CM | POA: Diagnosis present

## 2018-07-16 DIAGNOSIS — Z7901 Long term (current) use of anticoagulants: Secondary | ICD-10-CM

## 2018-07-16 DIAGNOSIS — Z4789 Encounter for other orthopedic aftercare: Secondary | ICD-10-CM | POA: Diagnosis not present

## 2018-07-16 DIAGNOSIS — D508 Other iron deficiency anemias: Secondary | ICD-10-CM | POA: Diagnosis not present

## 2018-07-16 DIAGNOSIS — M545 Low back pain: Secondary | ICD-10-CM | POA: Diagnosis not present

## 2018-07-16 DIAGNOSIS — Z7401 Bed confinement status: Secondary | ICD-10-CM | POA: Diagnosis not present

## 2018-07-16 DIAGNOSIS — I110 Hypertensive heart disease with heart failure: Secondary | ICD-10-CM | POA: Diagnosis not present

## 2018-07-16 DIAGNOSIS — S72001A Fracture of unspecified part of neck of right femur, initial encounter for closed fracture: Secondary | ICD-10-CM | POA: Diagnosis not present

## 2018-07-16 DIAGNOSIS — Z9981 Dependence on supplemental oxygen: Secondary | ICD-10-CM

## 2018-07-16 DIAGNOSIS — Z880 Allergy status to penicillin: Secondary | ICD-10-CM

## 2018-07-16 DIAGNOSIS — I5031 Acute diastolic (congestive) heart failure: Secondary | ICD-10-CM | POA: Diagnosis not present

## 2018-07-16 DIAGNOSIS — I509 Heart failure, unspecified: Secondary | ICD-10-CM | POA: Diagnosis not present

## 2018-07-16 DIAGNOSIS — I2510 Atherosclerotic heart disease of native coronary artery without angina pectoris: Secondary | ICD-10-CM | POA: Diagnosis present

## 2018-07-16 DIAGNOSIS — Z87891 Personal history of nicotine dependence: Secondary | ICD-10-CM

## 2018-07-16 DIAGNOSIS — S72009A Fracture of unspecified part of neck of unspecified femur, initial encounter for closed fracture: Secondary | ICD-10-CM | POA: Diagnosis present

## 2018-07-16 HISTORY — DX: Chronic obstructive pulmonary disease, unspecified: J44.9

## 2018-07-16 HISTORY — DX: Chronic combined systolic (congestive) and diastolic (congestive) heart failure: I50.42

## 2018-07-16 LAB — CBC WITH DIFFERENTIAL/PLATELET
BASOS PCT: 0 %
Basophils Absolute: 0 10*3/uL (ref 0.0–0.1)
EOS ABS: 0.1 10*3/uL (ref 0.0–0.7)
EOS PCT: 2 %
HCT: 28.5 % — ABNORMAL LOW (ref 36.0–46.0)
HEMOGLOBIN: 8.7 g/dL — AB (ref 12.0–15.0)
Lymphocytes Relative: 20 %
Lymphs Abs: 1.4 10*3/uL (ref 0.7–4.0)
MCH: 29.7 pg (ref 26.0–34.0)
MCHC: 30.5 g/dL (ref 30.0–36.0)
MCV: 97.3 fL (ref 78.0–100.0)
MONOS PCT: 11 %
Monocytes Absolute: 0.8 10*3/uL (ref 0.1–1.0)
NEUTROS PCT: 67 %
Neutro Abs: 4.6 10*3/uL (ref 1.7–7.7)
PLATELETS: 283 10*3/uL (ref 150–400)
RBC: 2.93 MIL/uL — ABNORMAL LOW (ref 3.87–5.11)
RDW: 14.1 % (ref 11.5–15.5)
WBC: 6.9 10*3/uL (ref 4.0–10.5)

## 2018-07-16 LAB — BASIC METABOLIC PANEL
Anion gap: 6 (ref 5–15)
BUN: 32 mg/dL — ABNORMAL HIGH (ref 8–23)
CALCIUM: 9.1 mg/dL (ref 8.9–10.3)
CHLORIDE: 103 mmol/L (ref 98–111)
CO2: 32 mmol/L (ref 22–32)
CREATININE: 1.34 mg/dL — AB (ref 0.44–1.00)
GFR, EST AFRICAN AMERICAN: 39 mL/min — AB (ref >60)
GFR, EST NON AFRICAN AMERICAN: 34 mL/min — AB (ref >60)
Glucose, Bld: 106 mg/dL — ABNORMAL HIGH (ref 70–99)
Potassium: 4.5 mmol/L (ref 3.5–5.1)
SODIUM: 141 mmol/L (ref 135–145)

## 2018-07-16 LAB — PROTIME-INR
INR: 2.63
PROTHROMBIN TIME: 27.8 s — AB (ref 11.4–15.2)

## 2018-07-16 MED ORDER — CARVEDILOL 6.25 MG PO TABS
6.2500 mg | ORAL_TABLET | Freq: Two times a day (BID) | ORAL | Status: DC
  Administered 2018-07-16 – 2018-07-21 (×9): 6.25 mg via ORAL
  Filled 2018-07-16 (×10): qty 1

## 2018-07-16 MED ORDER — MELATONIN 3 MG PO TABS
3.0000 mg | ORAL_TABLET | Freq: Every evening | ORAL | Status: DC | PRN
  Filled 2018-07-16: qty 1

## 2018-07-16 MED ORDER — POLYETHYLENE GLYCOL 3350 17 G PO PACK
17.0000 g | PACK | Freq: Every day | ORAL | Status: DC | PRN
  Administered 2018-07-19: 17 g via ORAL
  Filled 2018-07-16: qty 1

## 2018-07-16 MED ORDER — ALPRAZOLAM 0.25 MG PO TABS
0.2500 mg | ORAL_TABLET | Freq: Every evening | ORAL | Status: DC | PRN
  Administered 2018-07-16 – 2018-07-21 (×3): 0.25 mg via ORAL
  Filled 2018-07-16 (×3): qty 1

## 2018-07-16 MED ORDER — BISACODYL 5 MG PO TBEC
5.0000 mg | DELAYED_RELEASE_TABLET | Freq: Every day | ORAL | Status: DC | PRN
  Filled 2018-07-16: qty 1

## 2018-07-16 MED ORDER — HYDROCODONE-ACETAMINOPHEN 5-325 MG PO TABS
1.0000 | ORAL_TABLET | Freq: Four times a day (QID) | ORAL | Status: DC | PRN
  Administered 2018-07-18 – 2018-07-20 (×4): 1 via ORAL
  Filled 2018-07-16 (×2): qty 1
  Filled 2018-07-16: qty 2
  Filled 2018-07-16 (×2): qty 1

## 2018-07-16 MED ORDER — ATORVASTATIN CALCIUM 40 MG PO TABS
40.0000 mg | ORAL_TABLET | Freq: Every day | ORAL | Status: DC
  Administered 2018-07-16 – 2018-07-20 (×5): 40 mg via ORAL
  Filled 2018-07-16 (×6): qty 1

## 2018-07-16 MED ORDER — METHOCARBAMOL 500 MG PO TABS
500.0000 mg | ORAL_TABLET | Freq: Four times a day (QID) | ORAL | Status: DC | PRN
  Administered 2018-07-18 – 2018-07-21 (×4): 500 mg via ORAL
  Filled 2018-07-16 (×4): qty 1

## 2018-07-16 MED ORDER — ALBUTEROL SULFATE (2.5 MG/3ML) 0.083% IN NEBU: 2.5000 mg | INHALATION_SOLUTION | RESPIRATORY_TRACT | Status: DC | PRN

## 2018-07-16 MED ORDER — METHOCARBAMOL 1000 MG/10ML IJ SOLN
500.0000 mg | Freq: Four times a day (QID) | INTRAVENOUS | Status: DC | PRN
  Filled 2018-07-16: qty 5

## 2018-07-16 MED ORDER — PREGABALIN 75 MG PO CAPS
75.0000 mg | ORAL_CAPSULE | ORAL | Status: DC
  Administered 2018-07-17 – 2018-07-21 (×3): 75 mg via ORAL
  Filled 2018-07-16 (×3): qty 1

## 2018-07-16 MED ORDER — MORPHINE SULFATE (PF) 2 MG/ML IV SOLN
0.5000 mg | INTRAVENOUS | Status: DC | PRN
  Administered 2018-07-21: 0.5 mg via INTRAVENOUS
  Filled 2018-07-16: qty 1

## 2018-07-16 MED ORDER — BENAZEPRIL HCL 40 MG PO TABS
40.0000 mg | ORAL_TABLET | Freq: Every day | ORAL | Status: DC
  Administered 2018-07-17: 40 mg via ORAL
  Filled 2018-07-16: qty 2
  Filled 2018-07-16: qty 1

## 2018-07-16 MED ORDER — UMECLIDINIUM-VILANTEROL 62.5-25 MCG/INH IN AEPB
1.0000 | INHALATION_SPRAY | Freq: Every morning | RESPIRATORY_TRACT | Status: DC
  Administered 2018-07-17 – 2018-07-21 (×3): 1 via RESPIRATORY_TRACT
  Filled 2018-07-16 (×3): qty 14

## 2018-07-16 MED ORDER — DOCUSATE SODIUM 100 MG PO CAPS
100.0000 mg | ORAL_CAPSULE | Freq: Two times a day (BID) | ORAL | Status: DC
  Administered 2018-07-16 – 2018-07-21 (×8): 100 mg via ORAL
  Filled 2018-07-16 (×9): qty 1

## 2018-07-16 MED ORDER — DONEPEZIL HCL 10 MG PO TABS
10.0000 mg | ORAL_TABLET | Freq: Every day | ORAL | Status: DC
  Administered 2018-07-17 – 2018-07-21 (×5): 10 mg via ORAL
  Filled 2018-07-16 (×5): qty 1

## 2018-07-16 NOTE — ED Notes (Signed)
Pt has been doing physical therapy and reports pain with walking  She shows nurse how she flexes has L hip and knee and then reports it is better than it was and that it still hurts to do that   Encouraged not to flex hip

## 2018-07-16 NOTE — ED Provider Notes (Signed)
[facility] EMERGENCY DEPARTMENT Provider Note   CSN: [id number] Arrival date & time: 07/16/18  1528     History   Chief Complaint Chief Complaint  Patient presents with  . Hip Injury    HPI [name] is a 84 y.o. female.  Patient with history of COPD, CAD, hypertension who presents to the ED with right hip pain.  Patient with fall several weeks ago and at that time had a negative x-ray and CT scan of the right hip.  She has been in rehab after having heart failure exacerbation.  She continues to be on 3 L of oxygen.  Patient denies any new falls but has been doing physical therapy.  Patient continues to have right hip pain and had x-rays performed again today that showed right hip fracture.  The history is provided by the patient.  Hip Pain  This is a recurrent problem. The current episode started more than 2 days ago. The problem occurs daily. The problem has not changed since onset.Pertinent negatives include no chest pain, no abdominal pain, no headaches and no shortness of breath. The symptoms are aggravated by walking. Nothing relieves the symptoms. She has tried nothing for the symptoms. The treatment provided no relief.    Past Medical History:  Diagnosis Date  . Carotid artery stenosis    Without infarction  . COPD (chronic obstructive pulmonary disease) (HCC)   . Coronary artery disease   . Heart valve replaced by other means   . Hypercholesterolemia    Pure  . Hypertension    Unspecified  . LBBB (left bundle branch block)   . Macular degeneration (senile) of retina, unspecified   . Postsurgical aortocoronary bypass status   . Stroke (HCC)   . Transient global amnesia   . Unspecified hereditary and idiopathic peripheral neuropathy   . Unspecified vitamin D deficiency     Patient Active Problem List   Diagnosis Date Noted  . COPD exacerbation (HCC) 07/01/2018  . Right hip pain 07/01/2018  . Chronic respiratory failure with hypoxia, on home O2 therapy  (HCC) 07/01/2018  . CHF (congestive heart failure) (HCC) 06/28/2018  . Acute diastolic heart failure (HCC) 06/27/2018  . Anemia 06/27/2018  . Congestive dilated cardiomyopathy (HCC) 09/29/2015  . Encounter for therapeutic drug monitoring 12/12/2013  . Memory loss 04/16/2013  . Transient confusion 04/16/2013  . Mild malnutrition (HCC) 11/28/2012  . Dehydration 11/27/2012  . Dementia 11/27/2012  . Pneumonia 11/24/2012  . Aortic valve disorders 01/13/2011  . Carotid artery stenosis 10/13/2009  . S/P AVR (aortic valve replacement) 10/13/2009  . Hyperlipidemia 03/06/2009  . Essential hypertension 03/06/2009  . CAD, ARTERY BYPASS GRAFT 03/06/2009  . LBBB 03/06/2009  . CVA 03/06/2009    Past Surgical History:  Procedure Laterality Date  . AORTIC VALVE REPLACEMENT    . APPENDECTOMY    . CORONARY ARTERY BYPASS GRAFT    . TONSILLECTOMY       OB History   None      Home Medications    Prior to Admission medications   Medication Sig Start Date End Date Taking? Authorizing Provider  acetaminophen (TYLENOL) 325 MG tablet Take 2 tablets (650 mg total) by mouth every 6 (six) hours as needed for mild pain (or Fever >/= 101). 07/01/18   Kari Baars, MD  ALPRAZolam Prudy Feeler) 0.25 MG tablet Take 1 tablet (0.25 mg total) by mouth at bedtime as needed for anxiety. 07/04/18   Edmon Crape C, PA-C  atorvastatin (LIPITOR) 40 MG  tablet Take 1 tablet (40 mg total) by mouth daily. 02/14/18   Lewayne Bunting, MD  benazepril (LOTENSIN) 40 MG tablet TAKE (1) TABLET BY MOUTH ONCE DAILY. 03/07/18   Lewayne Bunting, MD  carvedilol (COREG) 6.25 MG tablet Take 6.25 mg by mouth 2 (two) times daily with a meal.    [provider]  docusate sodium (COLACE) 100 MG capsule Take 200 mg by mouth at bedtime.    [provider]  donepezil (ARICEPT) 10 MG tablet TAKE ONE TABLET BY MOUTH ONCE DAILY. 08/12/15   Penumalli, Glenford Bayley, MD  ergocalciferol (VITAMIN D2) 50000 UNITS capsule Take 50,000  Units by mouth every Tuesday.     [provider]  furosemide (LASIX) 40 MG tablet Take 1 tablet (40 mg total) by mouth daily. Patient taking differently: Take 20 mg by mouth daily.  07/01/18   Kari Baars, MD  hydrALAZINE (APRESOLINE) 10 MG tablet Take 10 mg by mouth daily as needed.    [provider]  levalbuterol Pauline Aus) 0.63 MG/3ML nebulizer solution Take 3 mLs (0.63 mg total) by nebulization every 8 (eight) hours as needed for wheezing or shortness of breath. 07/01/18   Kari Baars, MD  meclizine (ANTIVERT) 25 MG tablet Take 25 mg by mouth 3 (three) times daily as needed for dizziness.    [provider]  Melatonin 3 MG TABS Take 1 tablet (3 mg total) by mouth at bedtime as needed. 07/01/18   Kari Baars, MD  ondansetron (ZOFRAN ODT) 4 MG disintegrating tablet Take 1 tablet (4 mg total) by mouth every 8 (eight) hours as needed for nausea or vomiting. 07/02/18   Samuel Jester, DO  potassium chloride (K-DUR,KLOR-CON) 10 MEQ tablet Take 1 tablet (10 mEq total) by mouth 2 (two) times daily. 07/01/18   Kari Baars, MD  pregabalin (LYRICA) 75 MG capsule Take 1 capsule (75 mg total) by mouth every other day. 07/10/18   Edmon Crape C, PA-C  umeclidinium-vilanterol (ANORO ELLIPTA) 62.5-25 MCG/INH AEPB Inhale 1 puff into the lungs every morning.    [provider]  warfarin (COUMADIN) 2.5 MG tablet Take 2.5 mg by mouth daily. Take on Sun., Mon., Tues., Wed., Thru., Fri.    [provider]  warfarin (COUMADIN) 5 MG tablet Take 5 mg by mouth. Take once a day on Sat.,    [provider]    Family History Family History  Problem Relation Age of Onset  . Heart attack Father   . Stroke Sister   . Neuropathy Brother   . COPD Sister     Social History Social History   Tobacco Use  . Smoking status: Former Smoker    Packs/day: 0.50    Types: Cigarettes    Last attempt to quit: 11/30/1991    Years since quitting: 26.6  .  Smokeless tobacco: Never Used  . Tobacco comment: Tobacco use-no  Substance Use Topics  . Alcohol use: No  . Drug use: No     Allergies   Codeine and Penicillins   Review of Systems Review of Systems  Constitutional: Negative for chills and fever.  HENT: Negative for ear pain and sore throat.   Eyes: Negative for pain and visual disturbance.  Respiratory: Negative for cough and shortness of breath.   Cardiovascular: Negative for chest pain and palpitations.  Gastrointestinal: Negative for abdominal pain and vomiting.  Genitourinary: Negative for dysuria and hematuria.  Musculoskeletal: Positive for gait problem. Negative for arthralgias, back pain, neck pain and neck  stiffness.  Skin: Negative for color change and rash.  Neurological: Negative for seizures, syncope and headaches.  All other systems reviewed and are negative.    Physical Exam Updated Vital Signs  ED Triage Vitals [07/16/18 1531]  Enc Vitals Group     BP (!) 138/44     Pulse Rate 64     Resp 16     Temp 97.8 F (36.6 C)     Temp Source Oral     SpO2 100 %     Weight 100 lb 9.6 oz (45.6 kg)     Height 5\' 2"  (1.575 m)     Head Circumference      Peak Flow      Pain Score 7     Pain Loc      Pain Edu?      Excl. in GC?     Physical Exam  Constitutional: She is oriented to person, place, and time. She appears well-developed and well-nourished. No distress.  HENT:  Head: Normocephalic and atraumatic.  Eyes: Pupils are equal, round, and reactive to light. Conjunctivae and EOM are normal.  Neck: Normal range of motion. Neck supple.  Cardiovascular: Normal rate, regular rhythm, normal heart sounds and intact distal pulses.  No murmur heard. Pulmonary/Chest: Effort normal and breath sounds normal. No respiratory distress.  On 3L of 02  Abdominal: Soft. Bowel sounds are normal. There is no tenderness.  Musculoskeletal: She exhibits tenderness (TTP to right hip). She exhibits no edema or deformity.    Neurological: She is alert and oriented to person, place, and time.  Skin: Skin is warm and dry. Capillary refill takes less than 2 seconds.  Psychiatric: She has a normal mood and affect.  Nursing note and vitals reviewed.    ED Treatments / Results  Labs (all labs ordered are listed, but only abnormal results are displayed) Labs Reviewed  CBC WITH DIFFERENTIAL/PLATELET - Abnormal; Notable for the following components:      Result Value   RBC 2.93 (*)    Hemoglobin 8.7 (*)    HCT 28.5 (*)    All other components within normal limits  BASIC METABOLIC PANEL    EKG EKG Interpretation  Date/Time:  Sunday July 16 2018 16:27:45 EDT Ventricular Rate:  93 PR Interval:    QRS Duration: 169 QT Interval:  482 QTC Calculation: 494 R Axis:   -24 Text Interpretation:  Atrial fibrillation IVCD, consider atypical LBBB Confirmed by Virgina Norfolk [phone]) on 07/16/2018 4:30:42 PM   Radiology Dg Lumbar Spine Complete  Result Date: 07/16/2018 CLINICAL DATA:  84 year old currently in rehabilitation who fell approximately 3 weeks ago while walking with her walker. Progressive low back pain, RIGHT hip pain and RIGHT UPPER leg pain. Subsequent encounter. EXAM: LUMBAR SPINE - COMPLETE 4+ VIEW COMPARISON:  None. FINDINGS: Five non-rib-bearing lumbar vertebrae with anatomic alignment. No fractures. Osseous demineralization. Severe disc space narrowing at L3-4 and L4-5. Remaining disc spaces well-preserved. Severe LEFT and mild RIGHT facet degenerative changes at L4-5 and L5-S1. No pars defects. Aortoiliac atherosclerosis with evidence of a distal abdominal aortic aneurysm measuring up to approximately 3.5 cm diameter. IMPRESSION: 1. No acute osseous abnormality. 2. Osseous demineralization. 3. Severe degenerative disc disease at L3-4 and L4-5. 4. Severe LEFT and mild RIGHT facet degenerative changes at L4-5 and L5-S1. 5.  Aortic Atherosclerosis (ICD10-I70.0). 6. Aortic aneurysm NOS, measuring up to  approximately 3.5 cm diameter. (ICD10-I71.9). Recommend followup by ultrasound in 3 years. This recommendation follows ACR consensus guidelines:  White Paper of the ACR Incidental Findings Committee II on Vascular Findings. J Am Coll Radiol 2013; 10:789-794. Electronically Signed   By: Hulan Saas M.D.   On: 07/16/2018 12:30   Dg Hip Unilat With Pelvis Min 4 Views Right  Result Date: 07/16/2018 CLINICAL DATA:  84 year old currently in rehabilitation who fell approximately 3 weeks ago while walking with her walker. Progressive low back pain, RIGHT hip pain and RIGHT UPPER leg pain. Subsequent encounter. EXAM: DG HIP (WITH OR WITHOUT PELVIS) 4+V RIGHT COMPARISON:  AP pelvis and RIGHT hip x-ray 06/26/2018. RIGHT hip CT 06/26/2018. FINDINGS: Since the prior examinations 06/26/2018, development of a new sclerotic line involving the subcapital location of the RIGHT femoral neck. No evidence of acute or subacute fractures elsewhere. Mild axial joint space narrowing as noted previously. Osseous demineralization. Included AP pelvis again demonstrates remote healed fractures involving the RIGHT SUPERIOR and INFERIOR pubic rami. Symmetric mild axial joint space narrowing in the contralateral LEFT hip. Sacroiliac joints and symphysis pubis intact without evidence of diastasis. IMPRESSION: 1. New sclerotic line involving the subcapital location of the RIGHT femoral neck, likely indicating a subacute, minimally impacted nondisplaced fracture which was inconspicuous on the prior imaging 06/26/2018. 2. Symmetric mild osteoarthritis in both hips. 3. Osseous demineralization. 4. Remote healed fractures involving the RIGHT SUPERIOR and INFERIOR pubic rami. Electronically Signed   By: Hulan Saas M.D.   On: 07/16/2018 12:23   Dg Femur, Min 2 Views Right  Result Date: 07/16/2018 CLINICAL DATA:  84 year old currently in rehabilitation who fell approximately 3 weeks ago while walking with her walker. Progressive low back  pain, RIGHT hip pain and RIGHT UPPER leg pain. Subsequent encounter. EXAM: RIGHT FEMUR 2 VIEWS COMPARISON:  RIGHT hip x-rays obtained concurrently and 06/26/2018. No prior femur imaging. FINDINGS: Osseous demineralization. Sclerotic line involving the subcapital location of the RIGHT femoral neck as described in detail on the concurrent hip imaging. No fractures elsewhere involving the femur. Knee joint intact with moderate MEDIAL and LATERAL compartment joint space narrowing. IMPRESSION: 1. Subacute subcapital RIGHT femoral neck fracture as detailed on the concurrent hip imaging. 2. No fractures elsewhere involving the femur. 3. Osteoarthritis involving the RIGHT knee. Electronically Signed   By: Hulan Saas M.D.   On: 07/16/2018 12:25    Procedures Procedures (including critical care time)  Medications Ordered in ED Medications - No data to display   Initial Impression / Assessment and Plan / ED Course  I have reviewed the triage vital signs and the nursing notes.  Pertinent labs & imaging results that were available during my care of the patient were reviewed by me and considered in my medical decision making (see chart for details).     [name] is an 84 year old female history of COPD, heart failure who presents to the ED with right hip pain.  Patient with unremarkable vitals.  No fever.  Patient with x-ray done outpatient today that shows right hip fracture.  Patient denies any new falls.  Has been currently rehabbing after a fall 3 weeks ago that showed no acute fractures on CT scan.  Patient is neurovascularly intact on exam. Rt hip TTP. Has no chest pain, shortness of breath.  Patient stable on 3 L of oxygen.  Dr. Jena Gauss with orthopedics was consulted and recommends admission to [facility] for surgery likely tomorrow.  Hospitalist was called for admission and basic labs ordered for medical clearance of surgery.  EKG unremarkable.  Hemodynamically stable throughout my  care.  Final Clinical Impressions(s) / ED Diagnoses   Final diagnoses:  Closed fracture of right hip, initial encounter [facility])    ED Discharge Orders    None       Virgina Norfolk, DO 07/16/18 1635

## 2018-07-16 NOTE — H&P (Signed)
History and Physical    [name] WGN:[id number] DOB: 1926-02-28 DOA: 07/16/2018  PCP: Kari Baars, MD Consultants:  Jens Som - cardiology; Rankin - eye Patient coming from:  Home - lives alone; NOK: Daughter, [name], [phone]  Chief Complaint: Hip pain  HPI: [name] is a 84 y.o. female with medical history significant of CVA; HTN; HLD; CAD s/p CABG with aortic valve replacement (1997); chronic combined heart failure (EF 50-55%, grade 1 diastolic dysfunction); and COPD on home O2 presenting with hip pain.  She began having right hip pain in early August, but did not have any kind of fall.  She was active, going to the gym, did not require assistive devices.  The pain progressed and she began having some muscular pain on the right, as well, and so she started using a walker.   She has had some pain in her back and went to the gym the day before.   She fell on August 12. She got up to go to the bathroom that AM - she came out with her walker but it flipped over. She was unable to get herself up.  Her LOC fell and her family brought her to the ER - she was having hip pain and they also diagnosed diastolic CHF exacerbation.  She had imaging including a CT with no fracture at that time.  She was eventually discharged to [facility] for rehab.  She has been at [facility] since and was due to be discharged several days from now.  However, her right hip was continuing to bother her and she was unable to bear weight.  She now cannot walk without a walker, but she has made improvements with PT - just having ongoing pain.   [facility] sent her here for a hip x-ray this AM since the pain was ongoing.  They saw the fracture on the xray and sent her here for CT and ortho evaluation.   ED Course:  R hip fracture.  Dr. Louanne Belton requests admission to [facility], likely surgery tomorrow or Tuesday (NPO after midnight).  Fall 3 weeks ago - negative xray.  CHF exacerbation.  Had ongoing pain so PCP ordered  another xray.  Review of Systems: As per HPI; otherwise review of systems reviewed and negative.   Ambulatory Status:   See above  Past Medical History:  Diagnosis Date  . Carotid artery stenosis    Without infarction  . Chronic combined systolic (congestive) and diastolic (congestive) heart failure (HCC)   . COPD (chronic obstructive pulmonary disease) (HCC)   . Coronary artery disease   . Heart valve replaced by other means   . Hypercholesterolemia    Pure  . Hypertension    Unspecified  . LBBB (left bundle branch block)   . Macular degeneration (senile) of retina, unspecified   . Postsurgical aortocoronary bypass status   . Stroke (HCC)   . Transient global amnesia   . Unspecified hereditary and idiopathic peripheral neuropathy   . Unspecified vitamin D deficiency     Past Surgical History:  Procedure Laterality Date  . AORTIC VALVE REPLACEMENT    . APPENDECTOMY    . CORONARY ARTERY BYPASS GRAFT    . TONSILLECTOMY      Social History   Socioeconomic History  . Marital status: Widowed    Spouse name: Not on file  . Number of children: 2  . Years of education: 12th  . Highest education level: Not on file  Occupational History  Employer: RETIRED  Social Needs  . Financial resource strain: Not on file  . Food insecurity:    Worry: Not on file    Inability: Not on file  . Transportation needs:    Medical: Not on file    Non-medical: Not on file  Tobacco Use  . Smoking status: Former Smoker    Packs/day: 0.50    Types: Cigarettes    Last attempt to quit: 11/30/1991    Years since quitting: 26.6  . Smokeless tobacco: Never Used  . Tobacco comment: Tobacco use-no  Substance and Sexual Activity  . Alcohol use: No  . Drug use: No  . Sexual activity: Never  Lifestyle  . Physical activity:    Days per week: Not on file    Minutes per session: Not on file  . Stress: Not on file  Relationships  . Social connections:    Talks on phone: Not on file    Gets  together: Not on file    Attends religious service: Not on file    Active member of club or organization: Not on file    Attends meetings of clubs or organizations: Not on file    Relationship status: Not on file  . Intimate partner violence:    Fear of current or ex partner: Not on file    Emotionally abused: Not on file    Physically abused: Not on file    Forced sexual activity: Not on file  Other Topics Concern  . Not on file  Social History Narrative   Pt lives at home alone.   Caffeine Use: very rarely    Allergies  Allergen Reactions  . Codeine   . Penicillins     Family History  Problem Relation Age of Onset  . Heart attack Father   . Stroke Sister   . Neuropathy Brother   . COPD Sister     Prior to Admission medications   Medication Sig Start Date End Date Taking? Authorizing Provider  acetaminophen (TYLENOL) 325 MG tablet Take 2 tablets (650 mg total) by mouth every 6 (six) hours as needed for mild pain (or Fever >/= 101). 07/01/18   Kari Baars, MD  ALPRAZolam Prudy Feeler) 0.25 MG tablet Take 1 tablet (0.25 mg total) by mouth at bedtime as needed for anxiety. 07/04/18   Edmon Crape C, PA-C  atorvastatin (LIPITOR) 40 MG tablet Take 1 tablet (40 mg total) by mouth daily. 02/14/18   Lewayne Bunting, MD  benazepril (LOTENSIN) 40 MG tablet TAKE (1) TABLET BY MOUTH ONCE DAILY. 03/07/18   Lewayne Bunting, MD  carvedilol (COREG) 6.25 MG tablet Take 6.25 mg by mouth 2 (two) times daily with a meal.    [provider]  docusate sodium (COLACE) 100 MG capsule Take 200 mg by mouth at bedtime.    [provider]  donepezil (ARICEPT) 10 MG tablet TAKE ONE TABLET BY MOUTH ONCE DAILY. 08/12/15   Penumalli, Glenford Bayley, MD  ergocalciferol (VITAMIN D2) 50000 UNITS capsule Take 50,000 Units by mouth every Tuesday.     [provider]  furosemide (LASIX) 40 MG tablet Take 1 tablet (40 mg total) by mouth daily. Patient taking differently: Take 20 mg by mouth  daily.  07/01/18   Kari Baars, MD  hydrALAZINE (APRESOLINE) 10 MG tablet Take 10 mg by mouth daily as needed.    [provider]  levalbuterol Pauline Aus) 0.63 MG/3ML nebulizer solution Take 3 mLs (0.63 mg total) by nebulization every 8 (eight)  hours as needed for wheezing or shortness of breath. 07/01/18   Kari Baars, MD  meclizine (ANTIVERT) 25 MG tablet Take 25 mg by mouth 3 (three) times daily as needed for dizziness.    [provider]  Melatonin 3 MG TABS Take 1 tablet (3 mg total) by mouth at bedtime as needed. 07/01/18   Kari Baars, MD  ondansetron (ZOFRAN ODT) 4 MG disintegrating tablet Take 1 tablet (4 mg total) by mouth every 8 (eight) hours as needed for nausea or vomiting. 07/02/18   Samuel Jester, DO  potassium chloride (K-DUR,KLOR-CON) 10 MEQ tablet Take 1 tablet (10 mEq total) by mouth 2 (two) times daily. 07/01/18   Kari Baars, MD  pregabalin (LYRICA) 75 MG capsule Take 1 capsule (75 mg total) by mouth every other day. 07/10/18   Edmon Crape C, PA-C  umeclidinium-vilanterol (ANORO ELLIPTA) 62.5-25 MCG/INH AEPB Inhale 1 puff into the lungs every morning.    [provider]  warfarin (COUMADIN) 2.5 MG tablet Take 2.5 mg by mouth daily. Take on Sun., Mon., Tues., Wed., Thru., Fri.    [provider]  warfarin (COUMADIN) 5 MG tablet Take 5 mg by mouth. Take once a day on Sat.,    [provider]    Physical Exam: Vitals:   07/16/18 1700 07/16/18 1715 07/16/18 1724 07/16/18 1730  BP: (!) 150/59   (!) 158/57  Pulse:  73 73   Resp: 20 (!) 25 (!) 22 (!) 22  Temp:      TempSrc:      SpO2:  100% 100%   Weight:      Height:         General:  Appears calm and comfortable and is NAD Eyes:  PERRL, EOMI, normal lids, iris ENT:  grossly normal hearing, lips & tongue, mmm Neck:  no LAD, masses or thyromegaly Cardiovascular:  Irregularly irregular, no r/g. No LE edema.  She has a loud click associated with her mechanical  valve.  She also has a well healed midline surgical scar from his prior CABG. Respiratory:   CTA bilaterally with no wheezes/rales/rhonchi.  Normal respiratory effort. Abdomen:  soft, NT, ND, NABS Skin:  no rash or induration seen on limited exam Musculoskeletal:  grossly normal tone BUE/BLE, good ROM, no bony abnormality.  Perhaps mild right leg shortening. Lower extremity:  No LE edema.  Limited foot exam with no ulcerations.  2+ distal pulses. Psychiatric:  Mildly anxious mood and affect, speech fluent and appropriate, AOx3 Neurologic:  CN 2-12 grossly intact, moves all extremities in coordinated fashion, sensation intact    Radiological Exams on Admission: Dg Lumbar Spine Complete  Result Date: 07/16/2018 CLINICAL DATA:  84 year old currently in rehabilitation who fell approximately 3 weeks ago while walking with her walker. Progressive low back pain, RIGHT hip pain and RIGHT UPPER leg pain. Subsequent encounter. EXAM: LUMBAR SPINE - COMPLETE 4+ VIEW COMPARISON:  None. FINDINGS: Five non-rib-bearing lumbar vertebrae with anatomic alignment. No fractures. Osseous demineralization. Severe disc space narrowing at L3-4 and L4-5. Remaining disc spaces well-preserved. Severe LEFT and mild RIGHT facet degenerative changes at L4-5 and L5-S1. No pars defects. Aortoiliac atherosclerosis with evidence of a distal abdominal aortic aneurysm measuring up to approximately 3.5 cm diameter. IMPRESSION: 1. No acute osseous abnormality. 2. Osseous demineralization. 3. Severe degenerative disc disease at L3-4 and L4-5. 4. Severe LEFT and mild RIGHT facet degenerative changes at L4-5 and L5-S1. 5.  Aortic Atherosclerosis (ICD10-I70.0). 6. Aortic aneurysm NOS, measuring up to approximately 3.5  cm diameter. (ICD10-I71.9). Recommend followup by ultrasound in 3 years. This recommendation follows ACR consensus guidelines: White Paper of the ACR Incidental Findings Committee II on Vascular Findings. J Am Coll Radiol 2013;  10:789-794. Electronically Signed   By: Hulan Saas M.D.   On: 07/16/2018 12:30   Dg Hip Unilat With Pelvis Min 4 Views Right  Result Date: 07/16/2018 CLINICAL DATA:  84 year old currently in rehabilitation who fell approximately 3 weeks ago while walking with her walker. Progressive low back pain, RIGHT hip pain and RIGHT UPPER leg pain. Subsequent encounter. EXAM: DG HIP (WITH OR WITHOUT PELVIS) 4+V RIGHT COMPARISON:  AP pelvis and RIGHT hip x-ray 06/26/2018. RIGHT hip CT 06/26/2018. FINDINGS: Since the prior examinations 06/26/2018, development of a new sclerotic line involving the subcapital location of the RIGHT femoral neck. No evidence of acute or subacute fractures elsewhere. Mild axial joint space narrowing as noted previously. Osseous demineralization. Included AP pelvis again demonstrates remote healed fractures involving the RIGHT SUPERIOR and INFERIOR pubic rami. Symmetric mild axial joint space narrowing in the contralateral LEFT hip. Sacroiliac joints and symphysis pubis intact without evidence of diastasis. IMPRESSION: 1. New sclerotic line involving the subcapital location of the RIGHT femoral neck, likely indicating a subacute, minimally impacted nondisplaced fracture which was inconspicuous on the prior imaging 06/26/2018. 2. Symmetric mild osteoarthritis in both hips. 3. Osseous demineralization. 4. Remote healed fractures involving the RIGHT SUPERIOR and INFERIOR pubic rami. Electronically Signed   By: Hulan Saas M.D.   On: 07/16/2018 12:23   Dg Femur, Min 2 Views Right  Result Date: 07/16/2018 CLINICAL DATA:  84 year old currently in rehabilitation who fell approximately 3 weeks ago while walking with her walker. Progressive low back pain, RIGHT hip pain and RIGHT UPPER leg pain. Subsequent encounter. EXAM: RIGHT FEMUR 2 VIEWS COMPARISON:  RIGHT hip x-rays obtained concurrently and 06/26/2018. No prior femur imaging. FINDINGS: Osseous demineralization. Sclerotic line involving  the subcapital location of the RIGHT femoral neck as described in detail on the concurrent hip imaging. No fractures elsewhere involving the femur. Knee joint intact with moderate MEDIAL and LATERAL compartment joint space narrowing. IMPRESSION: 1. Subacute subcapital RIGHT femoral neck fracture as detailed on the concurrent hip imaging. 2. No fractures elsewhere involving the femur. 3. Osteoarthritis involving the RIGHT knee. Electronically Signed   By: Hulan Saas M.D.   On: 07/16/2018 12:25    EKG: Independently reviewed.  Afib with rate 93; IVCD with no evidence of acute ischemia   Labs on Admission: I have personally reviewed the available labs and imaging studies at the time of the admission.  Pertinent labs:   BUN 30/Creatinine 1.09/GFR 51 - stable WBC 7.2 Hgb 8.6; appears to be relatively stable INR 2.63 Heme negative x 3  Assessment/Plan Principal Problem:   Hip fracture (HCC) Active Problems:   Hyperlipidemia   Essential hypertension   CAD, ARTERY BYPASS GRAFT   S/P AVR (aortic valve replacement)   Dementia   Anemia   Chronic respiratory failure with hypoxia, on home O2 therapy (HCC)   R subacute non-displaced hip fracture -It is not clear exactly when the fracture took place, as it was not seen on CT on 8/12 -She has had ongoing hip pain while in rehab -Today's xray shows a "new sclerotic line involving the subcapital location of the right femoral neck, likely indicating a subacute, minimally impacted nondisplaced fracture which was inconspicuous on the prior imaging." -Orthopedics consult in AM -NPO after midnight in anticipation of surgical repair tomorrow -  although she will need cardiac clearance first and also has a therapeutic INR - so surgery may be delayed until Tuesday -Heparin per pharmacy while off Coumadin (see below) -CXR and EKG prior to surgery if not done in ER -Pain control with Robxain, Vicodin, and Morphine prn -SW consult for rehab  placement -Will need PT consult post-operatively -Hip fracture order set utilized  CAD/Afib/s/p AVR on Coumadin -She is on Coumadin and therapeutic for her artificial valve (goal 2.5-3.5) -Will hold tonight's dose of Coumadin and start Heparin drip as a bridge -She has a h/o CABG and recently had admission for CHF exacerbation -Will request cardiology evaluation for pre-operative clearance - message submitted to [employer], but since tomorrow is a holiday, an in-person consult in the AM at [facility] may be needed -She will need cardiac clearance and also likely normalization of INR to some extent prior to surgery, so surgery seems more likely to occur tomorrow afternoon vs. Tuesday  HTN -Continue Lotensin, Coreg  HLD -Continue Lipitor  Chronic respiratory failure from COPD, on home O2 -Continue home O2 -Continue Anoro Ellipta -Substitute prn Albuterol instead of Xopenex  Dementia -Continue Aricept -She seemed anxious at the time of my evaluation regarding the surgery, but otherwise was A&O and appropriate  CKD with anemia -Appears to be stable    DVT prophylaxis:  Heparin drip Code Status:  DNR - confirmed with patient/family Family Communication: Daughter and granddaughter were both present throughout evaluation  Disposition Plan:  Home once clinically improved Consults called: Orthopedics; SW, Nutrition; will need PT post-operatively  Admission status: Admit - It is my clinical opinion that admission to INPATIENT is reasonable and necessary because of the expectation that this patient will require hospital care that crosses at least 2 midnights to treat this condition based on the medical complexity of the problems presented.  Given the aforementioned information, the predictability of an adverse outcome is felt to be significant.     Jonah Blue MD [facility]  If note is complete, please contact covering daytime or nighttime physician. [email or web address] Password  [facility]  07/16/2018, 5:48 PM

## 2018-07-16 NOTE — ED Notes (Signed)
Sent from [facility] for evaluation of hip fx  Family members speak of their frustration that she is due to be discharged and she needs a CT  Physician is at [facility]

## 2018-07-16 NOTE — ED Notes (Signed)
Report to Roj, RN

## 2018-07-16 NOTE — Progress Notes (Signed)
ANTICOAGULATION CONSULT NOTE - Initial Consult  Pharmacy Consult for heparin Indication: valve replacment-holding coumadin pre-op  Allergies  Allergen Reactions  . Codeine   . Penicillins     Patient Measurements: Height: 5\' 2"  (157.5 cm) Weight: 100 lb 9.6 oz (45.6 kg) IBW/kg (Calculated) : 50.1 Heparin Dosing Weight: 45.6 kg  Vital Signs: Temp: 97.5 F (36.4 C) (09/01 1850) Temp Source: Oral (09/01 1850) BP: 128/64 (09/01 1850) Pulse Rate: 64 (09/01 1850)  Labs: Recent Labs    07/14/18 0715 07/16/18 0420 07/16/18 1617  HGB  --   --  8.7*  HCT  --   --  28.5*  PLT  --   --  283  LABPROT 29.9* 27.8*  --   INR 2.87 2.63  --   CREATININE  --   --  1.34*    Estimated Creatinine Clearance: 20.5 mL/min (A) (by C-G formula based on SCr of 1.34 mg/dL (H)).   Medical History: Past Medical History:  Diagnosis Date  . Carotid artery stenosis    Without infarction  . Chronic combined systolic (congestive) and diastolic (congestive) heart failure (HCC)   . COPD (chronic obstructive pulmonary disease) (HCC)   . Coronary artery disease   . Heart valve replaced by other means   . Hypercholesterolemia    Pure  . Hypertension    Unspecified  . LBBB (left bundle branch block)   . Macular degeneration (senile) of retina, unspecified   . Postsurgical aortocoronary bypass status   . Stroke (HCC)   . Transient global amnesia   . Unspecified hereditary and idiopathic peripheral neuropathy   . Unspecified vitamin D deficiency     Medications:  Medications Prior to Admission  Medication Sig Dispense Refill Last Dose  . acetaminophen (TYLENOL) 325 MG tablet Take 2 tablets (650 mg total) by mouth every 6 (six) hours as needed for mild pain (or Fever >/= 101).   Taking  . ALPRAZolam (XANAX) 0.25 MG tablet Take 1 tablet (0.25 mg total) by mouth at bedtime as needed for anxiety. 14 tablet 0 Taking  . atorvastatin (LIPITOR) 40 MG tablet Take 1 tablet (40 mg total) by mouth daily.  90 tablet 3 Taking  . benazepril (LOTENSIN) 40 MG tablet TAKE (1) TABLET BY MOUTH ONCE DAILY. 90 tablet 1 Taking  . carvedilol (COREG) 6.25 MG tablet Take 6.25 mg by mouth 2 (two) times daily with a meal.   Taking  . docusate sodium (COLACE) 100 MG capsule Take 200 mg by mouth at bedtime.   Taking  . donepezil (ARICEPT) 10 MG tablet TAKE ONE TABLET BY MOUTH ONCE DAILY. 30 tablet 0 Taking  . ergocalciferol (VITAMIN D2) 50000 UNITS capsule Take 50,000 Units by mouth every Tuesday.    Taking  . furosemide (LASIX) 40 MG tablet Take 1 tablet (40 mg total) by mouth daily. (Patient taking differently: Take 20 mg by mouth daily. ) 30 tablet  Taking  . hydrALAZINE (APRESOLINE) 10 MG tablet Take 10 mg by mouth daily as needed.   Taking  . levalbuterol (XOPENEX) 0.63 MG/3ML nebulizer solution Take 3 mLs (0.63 mg total) by nebulization every 8 (eight) hours as needed for wheezing or shortness of breath. 3 mL 12 Taking  . meclizine (ANTIVERT) 25 MG tablet Take 25 mg by mouth 3 (three) times daily as needed for dizziness.   Taking  . Melatonin 3 MG TABS Take 1 tablet (3 mg total) by mouth at bedtime as needed. 30 tablet 0 Taking  . ondansetron [facility]  ODT) 4 MG disintegrating tablet Take 1 tablet (4 mg total) by mouth every 8 (eight) hours as needed for nausea or vomiting. 6 tablet 0 Taking  . potassium chloride (K-DUR,KLOR-CON) 10 MEQ tablet Take 1 tablet (10 mEq total) by mouth 2 (two) times daily.   Taking  . pregabalin (LYRICA) 75 MG capsule Take 1 capsule (75 mg total) by mouth every other day. 15 capsule 0   . umeclidinium-vilanterol (ANORO ELLIPTA) 62.5-25 MCG/INH AEPB Inhale 1 puff into the lungs every morning.   Taking  . warfarin (COUMADIN) 2.5 MG tablet Take 2.5 mg by mouth daily. Take on Sun., Mon., Tues., Wed., Thru., Fri.   Taking  . warfarin (COUMADIN) 5 MG tablet Take 5 mg by mouth. Take once a day on Sat.,   Taking    Assessment: Pharmacy consulted to dose heparin in patient with valve  replacement. Patient is on warfarin at home and INR on 9/1 is 2.63.  Will continue to monitor INR and initiate heparin once INR is below 2.  Coumadin is on hold- patient has hip fracture and plan is for surgery tomorrow at [facility]  Goal of Therapy:  Heparin level 0.3-0.7 units/ml Monitor platelets by anticoagulation protocol: Yes   Plan:  Monitor daily INR Initiate heparin when INR drops below 2.  Salvatore Decent Keir Viernes 07/16/2018,6:59 PM

## 2018-07-16 NOTE — ED Notes (Signed)
Pt in CT  Family support

## 2018-07-16 NOTE — ED Notes (Signed)
From CT 

## 2018-07-16 NOTE — ED Notes (Signed)
Fell 4 weeks ago Evaluated for fall and heart failure Was in ICU for heart failure Has been at [facility] for Hip pain  Repeat x ray today showed hip fx Here for eval and possible CT

## 2018-07-16 NOTE — ED Notes (Signed)
CT in x 3 u/a to take pt  Call to CT pt needs to go next pt

## 2018-07-16 NOTE — ED Notes (Signed)
Report to [place], [employer]

## 2018-07-16 NOTE — ED Notes (Signed)
Call for report  RN will call back 

## 2018-07-16 NOTE — Progress Notes (Signed)
Received Pt at 18:41, alert and oriented x4, denies pain, VS taken and recorded, due meds given, endorsed accordingly.

## 2018-07-16 NOTE — Progress Notes (Signed)
I was called regarding this patient. Hx of hip pain s/p fall 1 month ago. Continued hip pain with possible changes on x-rays consistent with possible femoral neck fracture. Recommend obtaining CT scan as x-rays are equivocal. Recommend admission to [facility] on hospitalist service, will consult upon arrival or tomorrow AM. Please keep NPO past midnight in case of surgical intervention tomorrow.  Roby Lofts, MD [facility] [phone] (phone)

## 2018-07-16 NOTE — ED Notes (Signed)
Dr Ophelia Charter in

## 2018-07-16 NOTE — Progress Notes (Signed)
This is an acute visit  Level care skilled.  Facility is [facility].  Chief complaint --acute visit secondary to right upper leg hip pain- with x-ray showing possible subacute fracture of femoral head  History of present illness.  Patient is a pleasant 84 year old female seen today for discharge from facility later this week.  She was here for rehab after hospitalization for diastolic CHF.  She also has a history of coronary artery disease as well as left bundle branch block- aortic valve replacement on chronic Coumadin with goal INR 2.2-3  As well as hyperlipidemia hypertension macular degeneration  She fell at home was brought to the ED did not have any acute fracture-but she was found to be in diastolic CHF- Lasix was he increased to 40 mg a day she was started on hydralazine for elevated blood pressures-she is also on Coreg and benazepril.  At times her blood pressure was running low however her hydralazine has been made PRN once a day if systolic is greater than 160  Her Lasix also has been reduced down to 20 mg a day- her weight appears to be stable at around 102 pounds  Continues to complain of some right hip in femur pain-she did have a CT scan done in the hospital that did not show any acute fracture dislocation it did show degenerative changes in the right hip with a small right hip joint effusion CT scan was done because the initial x-ray showed a questionable step-off deformity at the right femoral head neck suggested CT follow-up-  We did order updated x-ray today since she continued to complain of pain --and it has come back showing a new sclerotic line involving the subcapital location of the right femoral neck likely indicating a subacute minimally impacted nondisplaced fracture that apparently was inconspicuous on the prior x-ray on June 26, 2018    I did discuss the findings with Dr. Lyn Hollingshead via phone- and is thought this is most likely an old healing  fracture   Of note she also has remote healed fractures involving the right superior and inferior pubic rami.  I discussed the findings with her family including her daughter and granddaughter- they are concerned saying that she is had significant pain that is really limiting her mobility and this has been a significant change over the past couple weeks or so   Currently patient is able to stand without assistance but is quite weak- she does complain of some upper right leg pain- more  posterior right leg extending  into the hip  Her blood pressure also appears to be on the low side with systolics in the 90s sitting and standing- we did cut back on her Lasix dose- she also is on Lotensin 40 mg a day as well as Coreg 6.25 mg twice daily  Does not really complain of any acute dizziness she does have some chronic complaints of dizziness but says this is of short duration   Regards to her orthopedic issues- I did have an extensive discussion with her granddaughter Herbert Seta- and we will send her to the ER for follow-up studies most likely CT scan of the hip and right upper leg for further investigation- family also is concerned of possibly some neuropathy with this-and would really like to have an orthopedic consult as soon as possible    Past Medical History:  Diagnosis Date  . Carotid artery stenosis    Without infarction  . Coronary artery disease   . Heart valve replaced  by other means   . Hypercholesterolemia    Pure  . Hypertension    Unspecified  . LBBB (left bundle branch block)   . Macular degeneration (senile) of retina, unspecified   . Postsurgical aortocoronary bypass status   . Stroke (HCC)   . Transient global amnesia   . Unspecified hereditary and idiopathic peripheral neuropathy   . Unspecified vitamin D deficiency         Past Surgical History:  Procedure Laterality Date  . AORTIC VALVE REPLACEMENT    . APPENDECTOMY    . CORONARY ARTERY BYPASS  GRAFT    . TONSILLECTOMY          Allergies  Allergen Reactions  . Codeine   . Penicillins       MEDICATIONS     Medication Sig  . acetaminophen (TYLENOL) 325 MG tablet Take 2 tablets (650 mg total) by mouth every 6 (six) hours as needed for mild pain (or Fever >/= 101).  Marland Kitchen ALPRAZolam (XANAX) 0.25 MG tablet Take 1 tablet (0.25 mg total) by mouth at bedtime as needed for anxiety.  Marland Kitchen atorvastatin (LIPITOR) 40 MG tablet Take 1 tablet (40 mg total) by mouth daily.  . benazepril (LOTENSIN) 40 MG tablet TAKE (1) TABLET BY MOUTH ONCE DAILY.  . carvedilol (COREG) 6.25 MG tablet Take 6.25 mg by mouth 2 (two) times daily with a meal.  . docusate sodium (COLACE) 100 MG capsule Take 200 mg by mouth at bedtime.  . donepezil (ARICEPT) 10 MG tablet TAKE ONE TABLET BY MOUTH ONCE DAILY.  . ergocalciferol (VITAMIN D2) 50000 UNITS capsule Take 50,000 Units by mouth every Tuesday.   . furosemide (LASIX) 20 MG tablet Take 1 tablet (20 mg total) by mouth daily.  . hydrALAZINE (APRESOLINE) 10 MG tablet Take 10 mg by mouth daily as needed.  . levalbuterol (XOPENEX) 0.63 MG/3ML nebulizer solution Take 3 mLs (0.63 mg total) by nebulization every 8 (eight) hours as needed for wheezing or shortness of breath.  . meclizine (ANTIVERT) 25 MG tablet Take 25 mg by mouth 3 (three) times daily as needed for dizziness.  . Melatonin 3 MG TABS Take 1 tablet (3 mg total) by mouth at bedtime as needed.  . ondansetron (ZOFRAN ODT) 4 MG disintegrating tablet Take 1 tablet (4 mg total) by mouth every 8 (eight) hours as needed for nausea or vomiting.  . potassium chloride (K-DUR,KLOR-CON) 10 MEQ tablet Take 1 tablet (10 mEq total) by mouth 2 (two) times daily.  Marland Kitchen umeclidinium-vilanterol (ANORO ELLIPTA) 62.5-25 MCG/INH AEPB Inhale 1 puff into the lungs every morning.  . warfarin (COUMADIN) 2.5 MG tablet Take 2.5 mg by mouth daily. Take on Sun., Mon., Tues., Wed., Thru., Fri.  . warfarin (COUMADIN) 5 MG tablet Take 5 mg  by mouth. Take once a day on Sat.,  . [DISCONTINUED] pregabalin (LYRICA) 75 MG capsule Take 1 capsule (75 mg total) by mouth every other day.  . [DISCONTINUED] hydrALAZINE (APRESOLINE) 10 MG tablet Take 1 tablet (10 mg total) by mouth every 8 (eight) hours.  . [DISCONTINUED] promethazine (PHENERGAN) 12.5 MG tablet Take 12.5 mg by mouth every 6 (six) hours as needed for nausea or vomiting. Take for 24 hours from 8/18/-07/03/2018  . [DISCONTINUED] traMADol (ULTRAM) 50 MG tablet Take 1 tablet (50 mg total) by mouth every 12 (twelve) hours as needed for moderate pain or severe pain. (Patient not taking: Reported on 07/06/2018)   No facility-administered encounter medications on file as of 07/10/2018.    Physical  exam   she is afebrile- pulse is 72 respirations of 17-blood pressure is 96/40 sitting and standing.  Weight is 103.4 pounds.  In general this is a pleasant elderly female in no distress.  Her skin is warm and dry.  Eyes visual acuity appears to be intact she has prescription lenses.  Chest is clear to auscultation there is no labored breathing.  Heart is regular rate and rhythm with a baseline systolic murmur  does not really have significant lower extremity edema  Abdomen is soft nontender with positive bowel sounds  Musculoskeletal-is able to stand without assistance but continues to be quite weak-there is some tenderness palpation of her posterior right upper leg and hip area I do not really note any deformities   Neurologic appears to be grossly intact her speech is clear no lateralizing findings   Psych she is alert and oriented pleasant and appropriate   Review of Systems   General she is not complaining any fever chills her weight appears to be fairly stable  Skin does not complain of diaphoresis or rash or itching.  Head ears eyes nose mouth and throat does not complain of sore throat or visual changes.--has prescription lenses  Respiratory continues on  chronic oxygen is not complaining of shortness of breathor cough  Cardiac does not complain of chest pain edema is very minimal  GI does not complain of abdominal discomfort nausea vomiting diarrhea feels at times a little constipated.  Musculoskeletal --does complain of right upper leg pain with standing and activity.  Neurologic does not complain of headache at times will complain of dizziness especially when she sits up or stands fairly suddenly this is intermittent and apparently fairly chronic-she does have Antivert as needed.  Psych does not complain of being anxious or depressed --continues to  be in good spirits  LABS  July 16, 2018.  IINR-2.63  August-28-2019  Sodium 142  Potassium 4  Bun 30   creat--1.09   Assessment and plan.  1.-  History of right hip and upper leg pain- again she has had numerous studies as noted above most recent study done today shows a possible nondisplaced subacute fracture involving the femoral head- I did discuss this extensively with her daughter and granddaughter via phone- they say her mobility has been quite limited and this has been a significant change over the past several weeks.  They would like further investigations-will send her to the ER for further work-up I suspect there will be CT scan of the hip and femur- family also would like expedient orthopedic consult on this.  Again will send to the ER- for further evaluation.  Patient currently is not in any distress but does have continued leg discomfort.  In regards to her blood pressure issues she is on the Lotensin 40 mg a day- consider reducing this secondary to some lower blood pressures- although these appear to be well tolerated.  Regards to aortic valve replacement INR is therapeutic today-goal is again 2.2--up to 3- continue current dose of Coumadin and recheck on Tuesday  [id number]-of note greater than 40 minutes spent assessing patient-reviewing her x-rays and  discussing her status with her daughter as well as granddaughter via phone- and coordinating and formulating a plan of care- of note greater than 50% of time spent coordinating plan of care with input as noted above

## 2018-07-16 NOTE — ED Triage Notes (Signed)
Pt has had pain in r hip and thigh for over a month.  Reports was hospitalized for breathing problems and for her hip pain.  Reports had xray in ER and says it didn't show a fracture.  Pt was discharged to [facility] center for therapy.  Reports had x ray this morning that showed a subacute fracture.   Pt sent here because provider requesting CT scan and ortho consult.

## 2018-07-17 DIAGNOSIS — Z952 Presence of prosthetic heart valve: Secondary | ICD-10-CM

## 2018-07-17 DIAGNOSIS — I25700 Atherosclerosis of coronary artery bypass graft(s), unspecified, with unstable angina pectoris: Secondary | ICD-10-CM

## 2018-07-17 DIAGNOSIS — E7800 Pure hypercholesterolemia, unspecified: Secondary | ICD-10-CM

## 2018-07-17 DIAGNOSIS — I2510 Atherosclerotic heart disease of native coronary artery without angina pectoris: Secondary | ICD-10-CM

## 2018-07-17 DIAGNOSIS — Z01818 Encounter for other preprocedural examination: Secondary | ICD-10-CM

## 2018-07-17 DIAGNOSIS — S72001A Fracture of unspecified part of neck of right femur, initial encounter for closed fracture: Secondary | ICD-10-CM

## 2018-07-17 LAB — ABO/RH: ABO/RH(D): O NEG

## 2018-07-17 LAB — PROTIME-INR
INR: 2.1
PROTHROMBIN TIME: 23.4 s — AB (ref 11.4–15.2)

## 2018-07-17 LAB — SURGICAL PCR SCREEN
MRSA, PCR: NEGATIVE
STAPHYLOCOCCUS AUREUS: NEGATIVE

## 2018-07-17 MED ORDER — BOOST / RESOURCE BREEZE PO LIQD CUSTOM
1.0000 | ORAL | Status: DC
  Administered 2018-07-17 – 2018-07-19 (×2): 1 via ORAL

## 2018-07-17 MED ORDER — SODIUM CHLORIDE 0.9 % IV SOLN: INTRAVENOUS | Status: DC

## 2018-07-17 MED ORDER — SODIUM CHLORIDE 0.9 % IV SOLN
INTRAVENOUS | Status: DC
  Administered 2018-07-17: 15:00:00 via INTRAVENOUS

## 2018-07-17 MED ORDER — HYDRALAZINE HCL 20 MG/ML IJ SOLN
10.0000 mg | Freq: Four times a day (QID) | INTRAMUSCULAR | Status: DC | PRN
  Administered 2018-07-18: 10 mg via INTRAVENOUS
  Filled 2018-07-17: qty 1

## 2018-07-17 MED ORDER — METOPROLOL TARTRATE 5 MG/5ML IV SOLN: 5.0000 mg | INTRAVENOUS | Status: DC | PRN

## 2018-07-17 NOTE — Consult Note (Addendum)
Cardiology Consultation:   Patient ID: [name]; [id number]; May 06, 1926   Admit date: 07/16/2018 Date of Consult: 07/17/2018  Primary Care Provider: Kari Baars, MD Primary Cardiologist: Dr. [name]. MD   Patient Profile:   [name] is a 84 y.o. female with a hx of CVA, HTN, HLD, CAD s/p CABG with mechanical aortic valve replacement on chronic coumadin (1997), chronic combined heart failure (EF 50-55% with grade 1 diastolic dysfunction) and COPD on home O2 is being seen for pre-operative evaluation for right hip fracture at the request of Dr. Ophelia Charter.  History of Present Illness:   [name] is an 84yo F with a hx as stated above who presented to [facility] on 07/16/18 with progressive right hip pain. Pt states that she initially began having right hip pain in early August, however had not suffered a fall or accident. At baseline, she was ambulating well and continued to go to the gym three times per week. She did not require any assistive devices. Since that time, she states that the pain progressed to the point that she was having to use a walker for ambulation. On 08/12/1, she suffered a mechanical fall after getting up to go to the restroom. She was transported to the ED for further evaluation and unfortunately was diagnosed with diastolic CHF exacerbation. CT imaging was completed which showed no fracture at that time and she was discharged to [facility] after stabilization. Given her persistent pain and inability to bear weight, a repeat x-ray was completed at the rehabilitation center and found to have a right hip fracture. She has now been readmitted and is pending surgical intervention scheduled for 07/18/2018.  She denies chest pain, shortness of breath, palpitations, dizziness, orthopnea or syncope.  Prior to her hip pain, at baseline she was going to the gym at least 3 times per week.  She is an extremely functional 84 year old female and is  independent with most activities.  She does wear supplemental home O2 secondary to COPD however this does not appear to hinder her activities. She is on chronic Coumadin secondary to mechanical valve.  She takes daily Lasix 40 mg for her diastolic dysfunction.  In the ED, she was found to have a right hip fracture. Dr. Louanne Belton was consulted and requested hospitalist admission with plans for surgical intervention likely 07/18/18.   Of note, she was last seen by our team in the hospital on 06/30/18 in consult for acute diastolic heart failure exacerbation. She was treated with IV Lasix 40mg  in the ED and started hydralazine with improvement. At sign off on 06/30/18, she was to continue Lasix 40mg  PO daily and her Hydralazine was increased to 25mg  BID for greater BP control.   She was last seen in the office by her primary cardiologist on 01/18/18. It was noted that at the time of her last cardiac catheterization in 2011, all grafts were found to be occluded.  It also showed normal left main, 30% first diagonal, 50% LAD and 60% second diagonal. There is a 40 to 50% circumflex and a 40% right coronary artery stenosis.  Medical therapy was recommended. She had an echocardiogram on 01/26/2018 and showed normal left ventricular systolic function, LVEF 50 to 40%, grade 1 diastolic dysfunction, bioprosthetic aortic valve with trivial regurgitation, severe mitral valve annular calcification, moderate to severe left atrial dilatation, and mild to moderate pulmonary hypertension, PA pressure is 39 mmHg.  Past Medical History:  Diagnosis Date  . Carotid artery  stenosis    Without infarction  . Chronic combined systolic (congestive) and diastolic (congestive) heart failure (HCC)   . COPD (chronic obstructive pulmonary disease) (HCC)   . Coronary artery disease   . Heart valve replaced by other means   . Hypercholesterolemia    Pure  . Hypertension    Unspecified  . LBBB (left bundle branch block)   . Macular  degeneration (senile) of retina, unspecified   . Postsurgical aortocoronary bypass status   . Stroke (HCC)   . Transient global amnesia   . Unspecified hereditary and idiopathic peripheral neuropathy   . Unspecified vitamin D deficiency     Past Surgical History:  Procedure Laterality Date  . AORTIC VALVE REPLACEMENT    . APPENDECTOMY    . CORONARY ARTERY BYPASS GRAFT    . TONSILLECTOMY       Prior to Admission medications   Medication Sig Start Date End Date Taking? Authorizing Provider  acetaminophen (TYLENOL) 325 MG tablet Take 2 tablets (650 mg total) by mouth every 6 (six) hours as needed for mild pain (or Fever >/= 101). 07/01/18  Yes Kari Baars, MD  ALPRAZolam Prudy Feeler) 0.25 MG tablet Take 1 tablet (0.25 mg total) by mouth at bedtime as needed for anxiety. 07/04/18  Yes Lassen, Arlo C, PA-C  atorvastatin (LIPITOR) 40 MG tablet Take 1 tablet (40 mg total) by mouth daily. Patient taking differently: Take 40 mg by mouth at bedtime.  02/14/18  Yes Lewayne Bunting, MD  benazepril (LOTENSIN) 40 MG tablet TAKE (1) TABLET BY MOUTH ONCE DAILY. Patient taking differently: Take 40 mg by mouth daily.  03/07/18  Yes Lewayne Bunting, MD  carvedilol (COREG) 6.25 MG tablet Take 6.25 mg by mouth 2 (two) times daily with a meal.   Yes [provider]  docusate sodium (COLACE) 100 MG capsule Take 200 mg by mouth at bedtime.   Yes [provider]  donepezil (ARICEPT) 10 MG tablet TAKE ONE TABLET BY MOUTH ONCE DAILY. Patient taking differently: Take 10 mg by mouth at bedtime.  08/12/15  Yes Penumalli, Glenford Bayley, MD  ergocalciferol (VITAMIN D2) 50000 UNITS capsule Take 50,000 Units by mouth every Tuesday.    Yes [provider]  furosemide (LASIX) 20 MG tablet Take 20 mg by mouth daily.   Yes [provider]  hydrALAZINE (APRESOLINE) 10 MG tablet Take 10 mg by mouth daily as needed (SBP >160).    Yes [provider]  levalbuterol (XOPENEX) 0.63 MG/3ML  nebulizer solution Take 3 mLs (0.63 mg total) by nebulization every 8 (eight) hours as needed for wheezing or shortness of breath. 07/01/18  Yes Kari Baars, MD  meclizine (ANTIVERT) 25 MG tablet Take 25 mg by mouth 3 (three) times daily as needed for dizziness.   Yes [provider]  Melatonin 3 MG TABS Take 1 tablet (3 mg total) by mouth at bedtime as needed. Patient taking differently: Take 3 mg by mouth at bedtime as needed (sleep).  07/01/18  Yes Kari Baars, MD  ondansetron (ZOFRAN) 4 MG tablet Take 4 mg by mouth every 8 (eight) hours as needed for nausea or vomiting.   Yes [provider]  polyethylene glycol (MIRALAX / GLYCOLAX) packet Take 17 g by mouth daily.   Yes [provider]  potassium chloride (K-DUR,KLOR-CON) 10 MEQ tablet Take 1 tablet (10 mEq total) by mouth 2 (two) times daily. Patient taking differently: Take 10 mEq by mouth daily.  07/01/18  Yes Kari Baars, MD  pregabalin (LYRICA) 75 MG capsule Take 1 capsule (75 mg total) by mouth every other day. Patient taking differently: Take 75 mg by mouth See admin instructions. Take one capsule (75 mg) by mouth every other night 07/10/18  Yes Lassen, Arlo C, PA-C  umeclidinium-vilanterol (ANORO ELLIPTA) 62.5-25 MCG/INH AEPB Inhale 1 puff into the lungs daily.    Yes [provider]  warfarin (COUMADIN) 2.5 MG tablet Take 2.5 mg by mouth every evening.    Yes [provider]  furosemide (LASIX) 40 MG tablet Take 1 tablet (40 mg total) by mouth daily. Patient not taking: Reported on 07/16/2018 07/01/18   Kari Baars, MD  ondansetron (ZOFRAN ODT) 4 MG disintegrating tablet Take 1 tablet (4 mg total) by mouth every 8 (eight) hours as needed for nausea or vomiting. Patient not taking: Reported on 07/16/2018 07/02/18   Samuel Jester, DO    Inpatient Medications: Scheduled Meds: . atorvastatin  40 mg Oral q1800  . benazepril  40 mg Oral Daily  . carvedilol  6.25 mg Oral BID WC  .  docusate sodium  100 mg Oral BID  . donepezil  10 mg Oral Daily  . pregabalin  75 mg Oral QODAY  . umeclidinium-vilanterol  1 puff Inhalation q morning - 10a   Continuous Infusions: . methocarbamol (ROBAXIN) IV     PRN Meds: albuterol, ALPRAZolam, bisacodyl, HYDROcodone-acetaminophen, Melatonin, methocarbamol **OR** methocarbamol (ROBAXIN) IV, morphine injection, polyethylene glycol  Allergies:    Allergies  Allergen Reactions  . Codeine Nausea And Vomiting  . Penicillins Other (See Comments)    Unknown reaction - listed on MAR Has patient had a PCN reaction causing immediate rash, facial/tongue/throat swelling, SOB or lightheadedness with hypotension: Unknown Has patient had a PCN reaction causing severe rash involving mucus membranes or skin necrosis: Unknown Has patient had a PCN reaction that required hospitalization: Unknown Has patient had a PCN reaction occurring within the last 10 years: Unknown If all of the above answers are "NO", then may proceed with Cephalosporin use.    Social History:   Social History   Socioeconomic History  . Marital status: Widowed    Spouse name: Not on file  . Number of children: 2  . Years of education: 12th  . Highest education level: Not on file  Occupational History    Employer: RETIRED  Social Needs  . Financial resource strain: Not on file  . Food insecurity:    Worry: Not on file    Inability: Not on file  . Transportation needs:    Medical: Not on file    Non-medical: Not on file  Tobacco Use  . Smoking status: Former Smoker    Packs/day: 0.50    Types: Cigarettes    Last attempt to quit: 11/30/1991    Years since quitting: 26.6  . Smokeless tobacco: Never Used  . Tobacco comment: Tobacco use-no  Substance and Sexual Activity  . Alcohol use: No  . Drug use: No  . Sexual activity: Never  Lifestyle  . Physical activity:    Days per week: Not on file    Minutes per session: Not on file  . Stress: Not on file    Relationships  . Social connections:    Talks on phone: Not on file    Gets together: Not on file    Attends religious service: Not on file    Active member of club or organization: Not on file    Attends meetings of clubs or organizations: Not on  file    Relationship status: Not on file  . Intimate partner violence:    Fear of current or ex partner: Not on file    Emotionally abused: Not on file    Physically abused: Not on file    Forced sexual activity: Not on file  Other Topics Concern  . Not on file  Social History Narrative   Pt lives at home alone.   Caffeine Use: very rarely    Family History:   Family History  Problem Relation Age of Onset  . Heart attack Father   . Stroke Sister   . Neuropathy Brother   . COPD Sister    Family Status:  Family Status  Relation Name Status  . Father  Deceased  . Sister  Deceased  . Mother  Deceased  . Brother  (Not Specified)  . Sister  (Not Specified)    ROS:  Please see the history of present illness.  All other ROS reviewed and negative.     Physical Exam/Data:   Vitals:   07/16/18 1730 07/16/18 1850 07/16/18 2048 07/17/18 0543  BP: (!) 158/57 128/64 (!) 124/47 (!) 137/46  Pulse:  64 63 63  Resp: (!) 22 16 16 16   Temp:  (!) 97.5 F (36.4 C) (!) 97.5 F (36.4 C) (!) 97.2 F (36.2 C)  TempSrc:  Oral Oral Oral  SpO2:  100% 94% 95%  Weight:      Height:        Intake/Output Summary (Last 24 hours) at 07/17/2018 0727 Last data filed at 07/17/2018 0527 Gross per 24 hour  Intake 120 ml  Output 825 ml  Net -705 ml   Filed Weights   07/16/18 1531  Weight: 45.6 kg   Body mass index is 18.4 kg/m.   General: Elderly, frail, NAD Skin: Warm, dry, intact  Head: Normocephalic, atraumatic, clear, moist mucus membranes. Neck: Negative for carotid bruits. No JVD Lungs: Diminished bilaterally. No wheezes, rales, or rhonchi. Breathing is unlabored. Cardiovascular: RRR with S1 S2. No murmurs, rubs, gallops, or LV heave  appreciated. Abdomen: Soft, non-tender, non-distended with normoactive bowel sounds. No obvious abdominal masses. MSK: Strength and tone appear normal for age. 5/5 in all extremities Extremities: No edema. No clubbing or cyanosis. DP/PT pulses 2+ bilaterally Neuro: Alert and oriented. No focal deficits. No facial asymmetry. MAE spontaneously. Psych: Responds to questions appropriately with normal affect.     EKG:  The EKG was personally reviewed and demonstrates: NSR with PACs and LBBB with no acute abnormalities Telemetry:  Telemetry was personally reviewed and demonstrates:  No telemetry available  Relevant CV Studies:  ECHO: 01/26/18:  Study Conclusions  - Left ventricle: The cavity size was normal. Wall thickness was   increased in a pattern of mild LVH. Systolic function was normal.   The estimated ejection fraction was in the range of 50% to 55%.   Doppler parameters are consistent with abnormal left ventricular   relaxation (grade 1 diastolic dysfunction). - Aortic valve: A bioprosthesis was present. There was trivial   regurgitation. ( 1-2/6 diastolic murmur )  - Mitral valve: Severely calcified annulus. Valve area by pressure   half-time: 1.82 cm^2. - Left atrium: The atrium was moderately to severely dilated. - Pulmonary arteries: Systolic pressure was mildly to moderately   increased. PA peak pressure: 39 mm Hg (S).  CATH: None   Laboratory Data:  Chemistry Recent Labs  Lab 07/12/18 0753 07/16/18 1617  NA 142 141  K 4.0 4.5  CL 103 103  CO2 32 32  GLUCOSE 83 106*  BUN 30* 32*  CREATININE 1.09* 1.34*  CALCIUM 9.2 9.1  GFRNONAA 44* 34*  GFRAA 51* 39*  ANIONGAP 7 6    Total Protein  Date Value Ref Range Status  07/02/2018 6.4 (L) 6.5 - 8.1 g/dL Final   Albumin  Date Value Ref Range Status  07/02/2018 3.2 (L) 3.5 - 5.0 g/dL Final   AST  Date Value Ref Range Status  07/02/2018 26 15 - 41 U/L Final   ALT  Date Value Ref Range Status  07/02/2018  19 0 - 44 U/L Final   Alkaline Phosphatase  Date Value Ref Range Status  07/02/2018 70 38 - 126 U/L Final   Total Bilirubin  Date Value Ref Range Status  07/02/2018 0.6 0.3 - 1.2 mg/dL Final   Hematology Recent Labs  Lab 07/12/18 0753 07/16/18 1617  WBC 7.2 6.9  RBC 2.90*  2.90* 2.93*  HGB 8.6* 8.7*  HCT 28.3* 28.5*  MCV 97.6 97.3  MCH 29.7 29.7  MCHC 30.4 30.5  RDW 13.8 14.1  PLT 322 283   Cardiac EnzymesNo results for input(s): TROPONINI in the last 168 hours. No results for input(s): TROPIPOC in the last 168 hours.  BNPNo results for input(s): BNP, PROBNP in the last 168 hours.  DDimer No results for input(s): DDIMER in the last 168 hours. TSH:  Lab Results  Component Value Date   TSH 2.079 09/11/2014   Lipids: Lab Results  Component Value Date   CHOL 151 07/03/2018   HDL 56 07/03/2018   LDLCALC 84 07/03/2018   TRIG 57 07/03/2018   CHOLHDL 2.7 07/03/2018   HgbA1c:No results found for: HGBA1C  Radiology/Studies:  Dg Chest 1 View  Result Date: 07/16/2018 CLINICAL DATA:  84 year old female with history of right-sided hip fracture. Preoperative evaluation. EXAM: CHEST  1 VIEW COMPARISON:  Chest x-ray 07/02/2018. FINDINGS: Study is exceedingly limited by marked patient rotation to the right. With these limitations in mind, areas no definite acute consolidative airspace disease. No definite pleural effusions. No evidence of pulmonary edema. Heart size appears enlarged. Upper mediastinal contours are grossly distorted by patient's rotation. Aortic status post median sternotomy for CABG and aortic valve replacement. IMPRESSION: 1. Limited examination demonstrating no definite radiographic evidence of acute cardiopulmonary disease. 2. Aortic atherosclerosis. 3. Cardiomegaly. Electronically Signed   By: Trudie Reed M.D.   On: 07/16/2018 18:20   Dg Lumbar Spine Complete  Result Date: 07/16/2018 CLINICAL DATA:  84 year old currently in rehabilitation who fell  approximately 3 weeks ago while walking with her walker. Progressive low back pain, RIGHT hip pain and RIGHT UPPER leg pain. Subsequent encounter. EXAM: LUMBAR SPINE - COMPLETE 4+ VIEW COMPARISON:  None. FINDINGS: Five non-rib-bearing lumbar vertebrae with anatomic alignment. No fractures. Osseous demineralization. Severe disc space narrowing at L3-4 and L4-5. Remaining disc spaces well-preserved. Severe LEFT and mild RIGHT facet degenerative changes at L4-5 and L5-S1. No pars defects. Aortoiliac atherosclerosis with evidence of a distal abdominal aortic aneurysm measuring up to approximately 3.5 cm diameter. IMPRESSION: 1. No acute osseous abnormality. 2. Osseous demineralization. 3. Severe degenerative disc disease at L3-4 and L4-5. 4. Severe LEFT and mild RIGHT facet degenerative changes at L4-5 and L5-S1. 5.  Aortic Atherosclerosis (ICD10-I70.0). 6. Aortic aneurysm NOS, measuring up to approximately 3.5 cm diameter. (ICD10-I71.9). Recommend followup by ultrasound in 3 years. This recommendation follows ACR consensus guidelines: White Paper of the ACR Incidental Findings Committee II on Vascular Findings. J  Am Coll Radiol 2013; 10:789-794. Electronically Signed   By: Hulan Saas M.D.   On: 07/16/2018 12:30   Ct Hip Right Wo Contrast  Result Date: 07/16/2018 CLINICAL DATA:  Right hip fracture. EXAM: CT OF THE RIGHT HIP WITHOUT CONTRAST TECHNIQUE: Multidetector CT imaging of the right hip was performed according to the standard protocol. Multiplanar CT image reconstructions were also generated. COMPARISON:  Right hip x-rays from same day. CT right hip dated June 26, 2018. FINDINGS: Bones/Joint/Cartilage Subacute common nondisplaced fracture of the right subcapital femoral neck with mild sclerosis of the fracture line. No dislocation. Normal alignment. Small joint effusion. Severe osteopenia. Old right superior inferior pubic rami fractures. Mild right hip osteoarthritis. The Ligaments Ligaments are  suboptimally evaluated by CT. Muscles and Tendons Mild diffuse muscle atrophy. Moderate atrophy of the gluteus minimus muscle. Soft tissue No fluid collection or hematoma.  No soft tissue mass. The visualized internal pelvic contents demonstrate small right inguinal hernia now containing only fat. Sigmoid diverticulosis. Vascular calcifications. IMPRESSION: 1. Subacute, minimally impacted, subcapital right femoral neck fracture. Electronically Signed   By: Obie Dredge M.D.   On: 07/16/2018 17:54   Dg Hip Unilat With Pelvis Min 4 Views Right  Result Date: 07/16/2018 CLINICAL DATA:  84 year old currently in rehabilitation who fell approximately 3 weeks ago while walking with her walker. Progressive low back pain, RIGHT hip pain and RIGHT UPPER leg pain. Subsequent encounter. EXAM: DG HIP (WITH OR WITHOUT PELVIS) 4+V RIGHT COMPARISON:  AP pelvis and RIGHT hip x-ray 06/26/2018. RIGHT hip CT 06/26/2018. FINDINGS: Since the prior examinations 06/26/2018, development of a new sclerotic line involving the subcapital location of the RIGHT femoral neck. No evidence of acute or subacute fractures elsewhere. Mild axial joint space narrowing as noted previously. Osseous demineralization. Included AP pelvis again demonstrates remote healed fractures involving the RIGHT SUPERIOR and INFERIOR pubic rami. Symmetric mild axial joint space narrowing in the contralateral LEFT hip. Sacroiliac joints and symphysis pubis intact without evidence of diastasis. IMPRESSION: 1. New sclerotic line involving the subcapital location of the RIGHT femoral neck, likely indicating a subacute, minimally impacted nondisplaced fracture which was inconspicuous on the prior imaging 06/26/2018. 2. Symmetric mild osteoarthritis in both hips. 3. Osseous demineralization. 4. Remote healed fractures involving the RIGHT SUPERIOR and INFERIOR pubic rami. Electronically Signed   By: Hulan Saas M.D.   On: 07/16/2018 12:23   Dg Femur, Min 2 Views  Right  Result Date: 07/16/2018 CLINICAL DATA:  84 year old currently in rehabilitation who fell approximately 3 weeks ago while walking with her walker. Progressive low back pain, RIGHT hip pain and RIGHT UPPER leg pain. Subsequent encounter. EXAM: RIGHT FEMUR 2 VIEWS COMPARISON:  RIGHT hip x-rays obtained concurrently and 06/26/2018. No prior femur imaging. FINDINGS: Osseous demineralization. Sclerotic line involving the subcapital location of the RIGHT femoral neck as described in detail on the concurrent hip imaging. No fractures elsewhere involving the femur. Knee joint intact with moderate MEDIAL and LATERAL compartment joint space narrowing. IMPRESSION: 1. Subacute subcapital RIGHT femoral neck fracture as detailed on the concurrent hip imaging. 2. No fractures elsewhere involving the femur. 3. Osteoarthritis involving the RIGHT knee. Electronically Signed   By: Hulan Saas M.D.   On: 07/16/2018 12:25   Assessment and Plan:   1.  Preoperative clearance for right hip fracture, surgical intervention: -At baseline, patient is a very functional 84 year old female. Unfortunately, secondary to hip pain she has been somewhat less active.  She was last seen in patient for an  acute exacerbation of CHF, treated with IV Lasix and doing well.   -Echocardiogram from March 2019 with normal LV function and G1 DD and is on daily PO Lasix for fluid volume balance. -No anginal symptoms -Her Duke Activity status index was calculated at 18.95 with low/moderate risk (based on hx) for surgical intervention  -According to her revised cardiac risk index for preoperative risk calculation, she is considered a Class IV risk, indicating a 15.0% 30-day risk of death, MI, or cardiac arrest.  -Will need to consult pharmacy for coumadin bridging and will need to monitor fluid volume status closely.   2.  Chronic diastolic CHF: -Last echocardiogram 01/26/2018 revealed normal LV function with an LVEF of 50 to 55% and G1 DD,  bioprosthetic aortic valve with trivial regurgitation, severe mitral valve annular calcification, moderate to severe left atrial dilatation and mild to moderate pulmonary hypertension -On home Lasix 40 mg daily. -Will need to be mindful of fluid volume status perioperative and postoperatively -Weight, 100.6lb, patient states baseline weight is approximately 102lb  -I&O, net -700 mL  3.  Essential hypertension: -Stable, 137/46, 124/47, 128/64 -Continue current regimen  4.  History of CAD s/p CABG and mechanical aortic valve replacement 1997: -Denies anginal associated symptoms -On chronic Coumadin, INR today 2.10 -Last cardiac catheterization in 2011, all grafts were found to be occluded with normal left main, 30% first diagonal, 50% LAD and 60% second diagonal. There is a 40 to 50% circumflex and a 40% right coronary artery stenosis with medical management. -Echocardiogram on 01/26/2018 and showed normal left ventricular systolic function, LVEF 50 to 29%, grade 1 diastolic dysfunction, bioprosthetic aortic valve with trivial regurgitation, severe mitral valve annular calcification, moderate to severe left atrial dilatation, and mild to moderate pulmonary hypertension, PA pressure is 39 mmHg -Home carvedilol 6.25 mg twice daily, hydralazine 10 mg daily as needed, benazepril 40 mg daily -Continue statin, Coumadin 2.5 mg daily, INR 2.10 today  5.  COPD on supplemental home O2: -Stable on 2L [place] currently -On home Ellipta, Xopenex   For questions or updates, please contact [facility] Please consult [email or web address] for contact info under Cardiology/STEMI.   Raliegh Ip NP-C [facility] Pager: [phone] 07/17/2018 7:27 AM   Attending Note:   The patient was seen and examined.  Agree with assessment and plan as noted above.  Changes made to the above note as needed.  Patient seen and independently examined with Georgie Chard, NP .   We discussed all aspects of the encounter. I  agree with the assessment and plan as stated above.  1.  Preoperative evaluation prior to hip surgery: The patient has a history of chronic diastolic congestive heart failure but does not have any signs or symptoms of CHF or unstable angina.   I would put her at moderate risk for her upcoming surgery.  Her INR is still 2.1.  She will need her warfarin reversed.  We can start her on a heparin drip when her INR is below 2.0.  Pharmacy will manage her heparin and Coumadin.  2.  Coronary artery disease: She is status post coronary artery bypass grafting.  She has not had any episodes of angina.  She does not need any further work-up prior to her hip surgery.  2.  Essential hypertension: Blood pressure is normal.  Continue current medications.    I have spent a total of 40 minutes with patient reviewing hospital  notes , telemetry, EKGs, labs and examining patient as well as establishing an  assessment and plan that was discussed with the patient. > 50% of time was spent in direct patient care.    Vesta Mixer, Montez Hageman., MD, [facility] 07/17/2018, 10:28 AM [phone] [address],  [address] Office [phone] Pager [phone]

## 2018-07-17 NOTE — Progress Notes (Signed)
ANTICOAGULATION CONSULT NOTE  Pharmacy Consult for heparin Indication: valve replacment-holding coumadin pre-op  Allergies  Allergen Reactions  . Codeine Nausea And Vomiting  . Penicillins Other (See Comments)    Unknown reaction - listed on MAR Has patient had a PCN reaction causing immediate rash, facial/tongue/throat swelling, SOB or lightheadedness with hypotension: Unknown Has patient had a PCN reaction causing severe rash involving mucus membranes or skin necrosis: Unknown Has patient had a PCN reaction that required hospitalization: Unknown Has patient had a PCN reaction occurring within the last 10 years: Unknown If all of the above answers are "NO", then may proceed with Cephalosporin use.    Patient Measurements: Height: 5\' 2"  (157.5 cm) Weight: 100 lb 9.6 oz (45.6 kg) IBW/kg (Calculated) : 50.1 Heparin Dosing Weight: 45.6 kg  Vital Signs: Temp: 97.2 F (36.2 C) (09/02 0543) Temp Source: Oral (09/02 0543) BP: 137/46 (09/02 0543) Pulse Rate: 63 (09/02 0543)  Labs: Recent Labs    07/14/18 0715 07/16/18 0420 07/16/18 1617 07/17/18 0353  HGB  --   --  8.7*  --   HCT  --   --  28.5*  --   PLT  --   --  283  --   LABPROT 29.9* 27.8*  --  23.4*  INR 2.87 2.63  --  2.10  CREATININE  --   --  1.34*  --     Estimated Creatinine Clearance: 20.5 mL/min (A) (by C-G formula based on SCr of 1.34 mg/dL (H)).   Medical History: Past Medical History:  Diagnosis Date  . Carotid artery stenosis    Without infarction  . Chronic combined systolic (congestive) and diastolic (congestive) heart failure (HCC)   . COPD (chronic obstructive pulmonary disease) (HCC)   . Coronary artery disease   . Heart valve replaced by other means   . Hypercholesterolemia    Pure  . Hypertension    Unspecified  . LBBB (left bundle branch block)   . Macular degeneration (senile) of retina, unspecified   . Postsurgical aortocoronary bypass status   . Stroke (HCC)   . Transient global  amnesia   . Unspecified hereditary and idiopathic peripheral neuropathy   . Unspecified vitamin D deficiency     Medications:  Medications Prior to Admission  Medication Sig Dispense Refill Last Dose  . acetaminophen (TYLENOL) 325 MG tablet Take 2 tablets (650 mg total) by mouth every 6 (six) hours as needed for mild pain (or Fever >/= 101).   unknown  . ALPRAZolam (XANAX) 0.25 MG tablet Take 1 tablet (0.25 mg total) by mouth at bedtime as needed for anxiety. 14 tablet 0 07/14/2018 at 2200  . atorvastatin (LIPITOR) 40 MG tablet Take 1 tablet (40 mg total) by mouth daily. (Patient taking differently: Take 40 mg by mouth at bedtime. ) 90 tablet 3 07/15/2018 at \\1935   . benazepril (LOTENSIN) 40 MG tablet TAKE (1) TABLET BY MOUTH ONCE DAILY. (Patient taking differently: Take 40 mg by mouth daily. ) 90 tablet 1 07/16/2018 at 750  . carvedilol (COREG) 6.25 MG tablet Take 6.25 mg by mouth 2 (two) times daily with a meal.   07/16/2018 at 750  . docusate sodium (COLACE) 100 MG capsule Take 200 mg by mouth at bedtime.   07/15/2018 at 1935  . donepezil (ARICEPT) 10 MG tablet TAKE ONE TABLET BY MOUTH ONCE DAILY. (Patient taking differently: Take 10 mg by mouth at bedtime. ) 30 tablet 0 07/15/2018 at 1935  . ergocalciferol (VITAMIN D2) 50000 UNITS capsule  Take 50,000 Units by mouth every Tuesday.    07/11/2018 at am  . furosemide (LASIX) 20 MG tablet Take 20 mg by mouth daily.   07/16/2018 at 750  . hydrALAZINE (APRESOLINE) 10 MG tablet Take 10 mg by mouth daily as needed (SBP >160).    8/21 or 8/22  . levalbuterol (XOPENEX) 0.63 MG/3ML nebulizer solution Take 3 mLs (0.63 mg total) by nebulization every 8 (eight) hours as needed for wheezing or shortness of breath. 3 mL 12 unknown  . meclizine (ANTIVERT) 25 MG tablet Take 25 mg by mouth 3 (three) times daily as needed for dizziness.   07/03/2018  . Melatonin 3 MG TABS Take 1 tablet (3 mg total) by mouth at bedtime as needed. (Patient taking differently: Take 3 mg by  mouth at bedtime as needed (sleep). ) 30 tablet 0 07/08/2018  . ondansetron (ZOFRAN) 4 MG tablet Take 4 mg by mouth every 8 (eight) hours as needed for nausea or vomiting.   unknown  . polyethylene glycol (MIRALAX / GLYCOLAX) packet Take 17 g by mouth daily.   07/16/2018 at 750  . potassium chloride (K-DUR,KLOR-CON) 10 MEQ tablet Take 1 tablet (10 mEq total) by mouth 2 (two) times daily. (Patient taking differently: Take 10 mEq by mouth daily. )   07/16/2018 at 750  . pregabalin (LYRICA) 75 MG capsule Take 1 capsule (75 mg total) by mouth every other day. (Patient taking differently: Take 75 mg by mouth See admin instructions. Take one capsule (75 mg) by mouth every other night) 15 capsule 0 07/15/2018 at 1935  . umeclidinium-vilanterol (ANORO ELLIPTA) 62.5-25 MCG/INH AEPB Inhale 1 puff into the lungs daily.    07/16/2018 at 750  . warfarin (COUMADIN) 2.5 MG tablet Take 2.5 mg by mouth every evening.    07/15/2018 at 1605  . furosemide (LASIX) 40 MG tablet Take 1 tablet (40 mg total) by mouth daily. (Patient not taking: Reported on 07/16/2018) 30 tablet  Not Taking at Unknown time  . ondansetron (ZOFRAN ODT) 4 MG disintegrating tablet Take 1 tablet (4 mg total) by mouth every 8 (eight) hours as needed for nausea or vomiting. (Patient not taking: Reported on 07/16/2018) 6 tablet 0 Not Taking at Unknown time    Assessment: Pharmacy consulted to dose heparin in patient with valve replacement. Patient is on warfarin at home and INR on 9/1 is 2.63. PTA dose 2.5mg  daily. Coumadin is on hold- patient has hip fracture and plan is for surgery today.   INR therapeutic at 2.1 today. Will not initiate heparin at this time. H/H and plts stable, no reports of bleed.   Goal of Therapy:  Heparin level 0.3-0.7 units/ml Monitor platelets by anticoagulation protocol: Yes   Plan:  Monitor daily INR, CBC, s/sx bleeding Initiate heparin when INR drops below 2 F/u [facility] plans post surgery  Lenward Chancellor, PharmD PGY1 Pharmacy  Resident Phone [phone] 07/17/2018 7:11 AM

## 2018-07-17 NOTE — Progress Notes (Signed)
Initial Nutrition Assessment  DOCUMENTATION CODES:   Severe malnutrition in context of chronic illness, Underweight  INTERVENTION:    Boost Breeze po daily, each supplement provides 250 kcal and 9 grams of protein  NUTRITION DIAGNOSIS:   Severe Malnutrition related to chronic illness(COPD, CHF, CAD) as evidenced by severe fat depletion, severe muscle depletion  GOAL:   Patient will meet greater than or equal to 90% of their needs  MONITOR:   PO intake, Supplement acceptance, Labs, Skin, Weight trends, I & O's  REASON FOR ASSESSMENT:   Consult Hip fracture protocol  ASSESSMENT:   84 y.o. female with medical history significant of CVA; HTN; HLD; CAD s/p CABG with aortic valve replacement (1997); chronic combined heart failure (EF 50-55%, grade 1 diastolic dysfunction); and COPD on home O2 presenting with hip pain.  Pt admitted with closed hip fracture.  RD spoke with pt and family at bedside. Pt eating lunch. Pt admitted [facility]. Family reports pt has been eating well. Family also state pt's weight has been stable. Does have some fluid fluctuations.  Pt has liked Boost Breeze nutrition supplements. Amenable to receive here. Orthopedics note reviewed. Plan is for surgery tomorrow. Labs and medications reviewed.  NUTRITION - FOCUSED PHYSICAL EXAM:    Most Recent Value  Orbital Region  Mild depletion  Upper Arm Region  Severe depletion  Thoracic and Lumbar Region  Unable to assess  Buccal Region  Mild depletion  Temple Region  Mild depletion  Clavicle Bone Region  Severe depletion  Clavicle and Acromion Bone Region  Severe depletion  Scapular Bone Region  Unable to assess  Dorsal Hand  Unable to assess  Patellar Region  Severe depletion  Anterior Thigh Region  Severe depletion  Posterior Calf Region  Severe depletion  Edema (RD Assessment)  None     Diet Order:   Diet Order            Diet NPO time specified  Diet effective midnight       Diet Heart Room service appropriate? Yes; Fluid consistency: Thin  Diet effective now             EDUCATION NEEDS:   No education needs have been identified at this time  Skin:  Skin Assessment: Reviewed RN Assessment  Last BM:  9/1  Height:   Ht Readings from Last 1 Encounters:  07/16/18 5\' 2"  (1.575 m)   Weight:   Wt Readings from Last 1 Encounters:  07/16/18 45.6 kg   Ideal Body Weight:  50 kg  BMI:  Body mass index is 18.4 kg/m.  Estimated Nutritional Needs:   Kcal:  1200-1400  Protein:  60-75 gm  Fluid:  >/= 1.5 L  Maureen Chatters, RD, LDN Pager #: [phone] After-Hours Pager #: [phone]

## 2018-07-17 NOTE — Consult Note (Signed)
Orthopaedic Trauma Service (OTS) Consult   Patient ID: [name] MRN: [id number] DOB/AGE: 1926/01/25 84 y.o.  Reason for Consult:Right hip fracture Referring Physician: Dr. Lennice Sites, DO [facility]  HPI: [name] is an 84 y.o. female who is being seen in consultation at the request of Dr. Ronnald Nian for evaluation of right hip fracture.  Patient has a history of COPD, coronary artery disease, hypertension whose had hip pain for approximately 3 to 4 weeks.  The patient had a fall a few weeks ago.  She had negative x-rays at that time as well as a CT scan.  She has been in rehab due to a heart failure exacerbation.  She has continued to complain of right hip pain.  Especially with weightbearing.  Repeat x-rays were obtained yesterday which showed possible new sclerosis in the femoral neck and CT scan was obtained.  The patient has been on Coumadin for her mechanical heart valve.  The patient prior to this was ambulatory without assist device and she lives alone.  She was very active and independent.  Past Medical History:  Diagnosis Date  . Carotid artery stenosis    Without infarction  . Chronic combined systolic (congestive) and diastolic (congestive) heart failure ([place])   . COPD (chronic obstructive pulmonary disease) ([place])   . Coronary artery disease   . Heart valve replaced by other means   . Hypercholesterolemia    Pure  . Hypertension    Unspecified  . LBBB (left bundle branch block)   . Macular degeneration (senile) of retina, unspecified   . Postsurgical aortocoronary bypass status   . Stroke ([place])   . Transient global amnesia   . Unspecified hereditary and idiopathic peripheral neuropathy   . Unspecified vitamin D deficiency     Past Surgical History:  Procedure Laterality Date  . AORTIC VALVE REPLACEMENT    . APPENDECTOMY    . CORONARY ARTERY BYPASS GRAFT    . TONSILLECTOMY      Family History  Problem Relation Age of Onset  . Heart attack Father    . Stroke Sister   . Neuropathy Brother   . COPD Sister     Social History:  reports that she quit smoking about 26 years ago. Her smoking use included cigarettes. She smoked 0.50 packs per day. She has never used smokeless tobacco. She reports that she does not drink alcohol or use drugs.  Allergies:  Allergies  Allergen Reactions  . Codeine Nausea And Vomiting  . Penicillins Other (See Comments)    Unknown reaction - listed on MAR Has patient had a PCN reaction causing immediate rash, facial/tongue/throat swelling, SOB or lightheadedness with hypotension: Unknown Has patient had a PCN reaction causing severe rash involving mucus membranes or skin necrosis: Unknown Has patient had a PCN reaction that required hospitalization: Unknown Has patient had a PCN reaction occurring within the last 10 years: Unknown If all of the above answers are "NO", then may proceed with Cephalosporin use.    Medications:  No current facility-administered medications on file prior to encounter.    Current Outpatient Medications on File Prior to Encounter  Medication Sig Dispense Refill  . acetaminophen (TYLENOL) 325 MG tablet Take 2 tablets (650 mg total) by mouth every 6 (six) hours as needed for mild pain (or Fever >/= 101).    Marland Kitchen ALPRAZolam (XANAX) 0.25 MG tablet Take 1 tablet (0.25 mg total) by mouth at bedtime as needed for anxiety. 14 tablet 0  .  atorvastatin (LIPITOR) 40 MG tablet Take 1 tablet (40 mg total) by mouth daily. (Patient taking differently: Take 40 mg by mouth at bedtime. ) 90 tablet 3  . benazepril (LOTENSIN) 40 MG tablet TAKE (1) TABLET BY MOUTH ONCE DAILY. (Patient taking differently: Take 40 mg by mouth daily. ) 90 tablet 1  . carvedilol (COREG) 6.25 MG tablet Take 6.25 mg by mouth 2 (two) times daily with a meal.    . docusate sodium (COLACE) 100 MG capsule Take 200 mg by mouth at bedtime.    . donepezil (ARICEPT) 10 MG tablet TAKE ONE TABLET BY MOUTH ONCE DAILY. (Patient taking  differently: Take 10 mg by mouth at bedtime. ) 30 tablet 0  . ergocalciferol (VITAMIN D2) 50000 UNITS capsule Take 50,000 Units by mouth every Tuesday.     . furosemide (LASIX) 20 MG tablet Take 20 mg by mouth daily.    . hydrALAZINE (APRESOLINE) 10 MG tablet Take 10 mg by mouth daily as needed (SBP >160).     Marland Kitchen levalbuterol (XOPENEX) 0.63 MG/3ML nebulizer solution Take 3 mLs (0.63 mg total) by nebulization every 8 (eight) hours as needed for wheezing or shortness of breath. 3 mL 12  . meclizine (ANTIVERT) 25 MG tablet Take 25 mg by mouth 3 (three) times daily as needed for dizziness.    . Melatonin 3 MG TABS Take 1 tablet (3 mg total) by mouth at bedtime as needed. (Patient taking differently: Take 3 mg by mouth at bedtime as needed (sleep). ) 30 tablet 0  . ondansetron (ZOFRAN) 4 MG tablet Take 4 mg by mouth every 8 (eight) hours as needed for nausea or vomiting.    . polyethylene glycol (MIRALAX / GLYCOLAX) packet Take 17 g by mouth daily.    . potassium chloride (K-DUR,KLOR-CON) 10 MEQ tablet Take 1 tablet (10 mEq total) by mouth 2 (two) times daily. (Patient taking differently: Take 10 mEq by mouth daily. )    . pregabalin (LYRICA) 75 MG capsule Take 1 capsule (75 mg total) by mouth every other day. (Patient taking differently: Take 75 mg by mouth See admin instructions. Take one capsule (75 mg) by mouth every other night) 15 capsule 0  . umeclidinium-vilanterol (ANORO ELLIPTA) 62.5-25 MCG/INH AEPB Inhale 1 puff into the lungs daily.     Marland Kitchen warfarin (COUMADIN) 2.5 MG tablet Take 2.5 mg by mouth every evening.     . furosemide (LASIX) 40 MG tablet Take 1 tablet (40 mg total) by mouth daily. (Patient not taking: Reported on 07/16/2018) 30 tablet   . ondansetron (ZOFRAN ODT) 4 MG disintegrating tablet Take 1 tablet (4 mg total) by mouth every 8 (eight) hours as needed for nausea or vomiting. (Patient not taking: Reported on 07/16/2018) 6 tablet 0    ROS: Constitutional: No fever or chills Vision: No  changes in vision ENT: No difficulty swallowing CV: No chest pain Pulm: No SOB or wheezing GI: No nausea or vomiting GU: No urgency or inability to hold urine Skin: No poor wound healing Neurologic: No numbness or tingling Psychiatric: No depression or anxiety Heme: No bruising Allergic: No reaction to medications or food   Exam: Blood pressure (!) 137/46, pulse 63, temperature (!) 97.2 F (36.2 C), temperature source Oral, resp. rate 16, height 5' 2" (1.575 m), weight 45.6 kg, SpO2 95 %. General:NAD Orientation:Awake, alert and oriented x3 Mood and Affect: Cooperative and pleasant Gait: Not able to assess Coordination and balance: Within normal limits  Right lower extremity: Skin without  lesions.  No significant tenderness to palpation.  Patient is able to move her hip without severe discomfort.  No obvious deformities.  She is fully motor and sensory intact to the lower extremity with warm well-perfused foot  Left lower extremity: Skin without lesions. No tenderness to palpation. Full painless ROM, full strength in each muscle groups without evidence of instability.   Medical Decision Making: Imaging: X-rays of the right hip show sclerosis at the femoral neck.  This is unchanged from her previous radiographs.  CT scan of the hip shows a nondisplaced right subcapital femoral neck fracture.  There is a mild impaction.  Labs:  Results for orders placed or performed during the hospital encounter of 07/16/18 (from the past 48 hour(s))  CBC with Differential     Status: Abnormal   Collection Time: 07/16/18  4:17 PM  Result Value Ref Range   WBC 6.9 4.0 - 10.5 K/uL   RBC 2.93 (L) 3.87 - 5.11 MIL/uL   Hemoglobin 8.7 (L) 12.0 - 15.0 g/dL   HCT 28.5 (L) 36.0 - 46.0 %   MCV 97.3 78.0 - 100.0 fL   MCH 29.7 26.0 - 34.0 pg   MCHC 30.5 30.0 - 36.0 g/dL   RDW 14.1 11.5 - 15.5 %   Platelets 283 150 - 400 K/uL   Neutrophils Relative % 67 %   Neutro Abs 4.6 1.7 - 7.7 K/uL   Lymphocytes  Relative 20 %   Lymphs Abs 1.4 0.7 - 4.0 K/uL   Monocytes Relative 11 %   Monocytes Absolute 0.8 0.1 - 1.0 K/uL   Eosinophils Relative 2 %   Eosinophils Absolute 0.1 0.0 - 0.7 K/uL   Basophils Relative 0 %   Basophils Absolute 0.0 0.0 - 0.1 K/uL    Comment: Performed at [facility], [address] [place], [place] [address]  Basic metabolic panel     Status: Abnormal   Collection Time: 07/16/18  4:17 PM  Result Value Ref Range   Sodium 141 135 - 145 mmol/L   Potassium 4.5 3.5 - 5.1 mmol/L   Chloride 103 98 - 111 mmol/L   CO2 32 22 - 32 mmol/L   Glucose, Bld 106 (H) 70 - 99 mg/dL   BUN 32 (H) 8 - 23 mg/dL   Creatinine, Ser 1.34 (H) 0.44 - 1.00 mg/dL   Calcium 9.1 8.9 - 10.3 mg/dL   GFR calc non Af Amer 34 (L) >60 mL/min   GFR calc Af Amer 39 (L) >60 mL/min    Comment: (NOTE) The eGFR has been calculated using the CKD EPI equation. This calculation has not been validated in all clinical situations. eGFR's persistently <60 mL/min signify possible Chronic Kidney Disease.    Anion gap 6 5 - 15    Comment: Performed at [facility], [address] [place], [place] [address]  Protime-INR     Status: Abnormal   Collection Time: 07/17/18  3:53 AM  Result Value Ref Range   Prothrombin Time 23.4 (H) 11.4 - 15.2 seconds   INR 2.10     Comment: Performed at [facility] [address] [place], [place] [address]  Surgical pcr screen     Status: None   Collection Time: 07/17/18  3:57 AM  Result Value Ref Range   MRSA, PCR NEGATIVE NEGATIVE   Staphylococcus aureus NEGATIVE NEGATIVE    Comment: (NOTE) The Xpert SA Assay (FDA approved for NASAL specimens in patients 9 years of age and older), is one  component of a comprehensive surveillance program. It is not intended to diagnose infection nor to guide or monitor treatment. Performed at [facility], [place] [address], [place], [place] [address]     Medical history and chart was reviewed  Assessment/Plan: 84 year old  female with what appears to be subacute right femoral neck fracture, the patient has a history of coronary artery disease, A. fib and aortic valve replacement who is on Coumadin.  She also has chronic respiratory failure.  Her CT scan findings show a subacute right femoral neck fracture.  She is at high risk for displacement if she continues to try to weight-bear.  I feel that percutaneous fixation is most appropriate.  Currently she has a significant cardiac history as well as COPD history and will need to undergo cardiac clearance.  Her INR also this morning is above 2.  I would like the INR to come down below 2 especially in this subacute fracture.  We will tentatively plan to proceed with surgery tomorrow 07/18/2018.  Discussed risks and benefits with the family.  They agreed to proceed with surgery.  Shona Needles, MD [facility] [phone] (phone)

## 2018-07-17 NOTE — Plan of Care (Signed)

## 2018-07-17 NOTE — Progress Notes (Signed)
@IPLOG @        PROGRESS NOTE                                                                                                                                                                                                             Patient Demographics:    [name], is a 84 y.o. female, DOB - 1926/07/06, XIP:[id number]  Admit date - 07/16/2018   Admitting Physician Jonah Blue, MD  Outpatient Primary MD for the patient is Kari Baars, MD  LOS - 1  Chief Complaint  Patient presents with  . Hip Injury       Brief Narrative  [name] is a 84 y.o. female with medical history significant of CVA; HTN; HLD; CAD s/p CABG with aortic valve replacement (1997); chronic combined heart failure (EF 50-55%, grade 1 diastolic dysfunction); and COPD on home O2 presenting with hip pain.  She began having right hip pain in early August, but did not have any kind of fall.  She was active, going to the gym, did not require assistive devices.  The pain progressed and she began having some muscular pain on the right, as well, and so she started using a walker.   She has had some pain in her back and went to the gym the day before.   She fell on August 12. She got up to go to the bathroom that AM - she came out with her walker but it flipped over. She was unable to get herself up.  Her LOC fell and her family brought her to the ER - she was having hip pain and they also diagnosed diastolic CHF exacerbation.  She had imaging including a CT with no fracture at that time.  She was eventually discharged to [facility] for rehab.  She has been at [facility] since and was due to be discharged several days from now.  However, her right hip was continuing to bother her and she was unable to bear weight.  She now cannot walk without a walker, but she has made improvements with PT - just having ongoing pain.   [facility] sent her here for a hip x-ray this AM since the pain was ongoing.  They saw the  fracture on the xray and sent her here for CT and ortho evaluation.   Subjective:    [name] today has, No headache, No chest pain, No abdominal pain - No Nausea, No new weakness tingling or numbness, No Cough - SOB.  Assessment  & Plan :       R subacute non-displaced hip fracture  -with history of one mechanical fall, however says there was some pain even prior to the fall, for now orthopedics has seen the patient due for surgery tomorrow, kindly see cardiology's recommendation and preop evaluation for all details of perioperative risk.  Request orthopedics to kindly address postop weightbearing and DVT prophylaxis, she most likely can go back on Coumadin.  Will require SNF.  CAD status post mechanical aortic valve placement. - She is on Coumadin and therapeutic for her artificial valve (goal 2.5-3.5), currently stable chest pain-free, EKG nonacute, seen and cleared by cardiology this admission.  Continue beta-blocker and statin for secondary prevention.  Hold Coumadin for surgery, pharmacy to bridge with heparin.  HTN  - Continue Coreg, hold ACE inhibitor due to ARF, PRN IV hydralazine and Lopressor as needed.  HLD  - Continue Lipitor  Chronic respiratory failure from COPD, on home O2  - this is due to underlying COPD, currently compensated, continue home nebulized medications and as needed nebulizer, continue oxygen.  No wheezing on exam shortness of breath, no acute issue.   Dementia - Continue Aricept is at risk for delirium.  Minimize narcotics and benzodiazepines.  Anemia of chronic disease.  Stable.  Will type screen and monitor.  CKD 3 with mild ARF.  Hold ACE inhibitor, gentle hydration and monitor.  Plan creatinine appears to be around 1.2.    Family Communication  : daughter Bedside  Code Status : DNR  Disposition Plan  :  Likely SNF  Consults  :  Cards, Ortho  Procedures  :    Echocardiogram from March 2019.  - Left ventricle: The cavity size was  normal. Wall thickness was  increased in a pattern of mild LVH. Systolic function was normal. The estimated ejection fraction was in the range of 50% to 55%. Doppler parameters are consistent with abnormal left ventricular relaxation (grade 1 diastolic dysfunction). - Aortic valve: A bioprosthesis was present. There was trivial  regurgitation. - Mitral valve: Severely calcified annulus. Valve area by pressure  half-time: 1.82 cm^2. - Left atrium: The atrium was moderately to severely dilated. - Pulmonary arteries: Systolic pressure was mildly to moderately  increased. PA peak pressure: 39 mm Hg (S).  DVT Prophylaxis  : Heparin gtt/Coumadin  Lab Results  Component Value Date   INR 2.10 07/17/2018   INR 2.63 07/16/2018   INR 2.87 07/14/2018   PROTIME 20.5 05/01/2009     Lab Results  Component Value Date   PLT 283 07/16/2018    Diet :  Diet Order            Diet NPO time specified  Diet effective midnight        Diet Heart Room service appropriate? Yes; Fluid consistency: Thin  Diet effective now               Inpatient Medications Scheduled Meds: . atorvastatin  40 mg Oral q1800  . benazepril  40 mg Oral Daily  . carvedilol  6.25 mg Oral BID WC  . docusate sodium  100 mg Oral BID  . donepezil  10 mg Oral Daily  . pregabalin  75 mg Oral QODAY  . umeclidinium-vilanterol  1 puff Inhalation q morning - 10a   Continuous Infusions: . methocarbamol (ROBAXIN) IV     PRN Meds:.albuterol, ALPRAZolam, bisacodyl, HYDROcodone-acetaminophen, Melatonin, methocarbamol **OR** methocarbamol (ROBAXIN) IV, morphine injection, polyethylene glycol  Antibiotics  :  Anti-infectives (From admission, onward)   None          Objective:   Vitals:   07/16/18 1850 07/16/18 2048 07/17/18 0543 07/17/18 0845  BP: 128/64 (!) 124/47 (!) 137/46   Pulse: 64 63 63   Resp: 16 16 16    Temp: (!) 97.5 F (36.4 C) (!) 97.5 F (36.4 C) (!) 97.2 F (36.2 C)   TempSrc: Oral Oral Oral   SpO2:  100% 94% 95% 98%  Weight:      Height:        Wt Readings from Last 3 Encounters:  07/16/18 45.6 kg  07/06/18 46.1 kg  07/04/18 46 kg     Intake/Output Summary (Last 24 hours) at 07/17/2018 1201 Last data filed at 07/17/2018 0830 Gross per 24 hour  Intake 360 ml  Output 825 ml  Net -465 ml     Physical Exam  Awake Alert, Oriented X 3, No new F.N deficits, Normal affect Powell.AT,PERRAL Supple Neck,No JVD, No cervical lymphadenopathy appriciated.  Symmetrical Chest wall movement, Good air movement bilaterally, CTAB RRR,No Gallops,Rubs , loud mechanical systolic murmur, no Parasternal Heave +ve B.Sounds, Abd Soft, No tenderness, No organomegaly appriciated, No rebound - guarding or rigidity. No Cyanosis, Clubbing or edema, No new Rash or bruise      Data Review:    CBC Recent Labs  Lab 07/12/18 0753 07/16/18 1617  WBC 7.2 6.9  HGB 8.6* 8.7*  HCT 28.3* 28.5*  PLT 322 283  MCV 97.6 97.3  MCH 29.7 29.7  MCHC 30.4 30.5  RDW 13.8 14.1  LYMPHSABS 2.1 1.4  MONOABS 0.8 0.8  EOSABS 0.2 0.1  BASOSABS 0.0 0.0    Chemistries  Recent Labs  Lab 07/12/18 0753 07/16/18 1617  NA 142 141  K 4.0 4.5  CL 103 103  CO2 32 32  GLUCOSE 83 106*  BUN 30* 32*  CREATININE 1.09* 1.34*  CALCIUM 9.2 9.1   ------------------------------------------------------------------------------------------------------------------ No results for input(s): CHOL, HDL, LDLCALC, TRIG, CHOLHDL, LDLDIRECT in the last 72 hours.  No results found for: HGBA1C ------------------------------------------------------------------------------------------------------------------ No results for input(s): TSH, T4TOTAL, T3FREE, THYROIDAB in the last 72 hours.  Invalid input(s): FREET3 ------------------------------------------------------------------------------------------------------------------ No results for input(s): VITAMINB12, FOLATE, FERRITIN, TIBC, IRON, RETICCTPCT in the last 72 hours.  Coagulation  profile Recent Labs  Lab 07/12/18 0753 07/14/18 0715 07/16/18 0420 07/17/18 0353  INR 2.70 2.87 2.63 2.10    No results for input(s): DDIMER in the last 72 hours.  Cardiac Enzymes No results for input(s): CKMB, TROPONINI, MYOGLOBIN in the last 168 hours.  Invalid input(s): CK ------------------------------------------------------------------------------------------------------------------    Component Value Date/Time   BNP 1,329.0 (H) 06/26/2018 1935    Micro Results Recent Results (from the past 240 hour(s))  Surgical pcr screen     Status: None   Collection Time: 07/17/18  3:57 AM  Result Value Ref Range Status   MRSA, PCR NEGATIVE NEGATIVE Final   Staphylococcus aureus NEGATIVE NEGATIVE Final    Comment: (NOTE) The Xpert SA Assay (FDA approved for NASAL specimens in patients 82 years of age and older), is one component of a comprehensive surveillance program. It is not intended to diagnose infection nor to guide or monitor treatment. Performed at [facility], [address] [place], [place] [address]     Radiology Reports Dg Chest 1 View  Result Date: 07/16/2018 CLINICAL DATA:  84 year old female with history of right-sided hip fracture. Preoperative evaluation. EXAM: CHEST  1 VIEW COMPARISON:  Chest x-ray 07/02/2018.  FINDINGS: Study is exceedingly limited by marked patient rotation to the right. With these limitations in mind, areas no definite acute consolidative airspace disease. No definite pleural effusions. No evidence of pulmonary edema. Heart size appears enlarged. Upper mediastinal contours are grossly distorted by patient's rotation. Aortic status post median sternotomy for CABG and aortic valve replacement. IMPRESSION: 1. Limited examination demonstrating no definite radiographic evidence of acute cardiopulmonary disease. 2. Aortic atherosclerosis. 3. Cardiomegaly. Electronically Signed   By: Trudie Reed M.D.   On: 07/16/2018 18:20   Dg Chest 2  View  Result Date: 06/26/2018 CLINICAL DATA:  Weakness, right hip pain EXAM: CHEST - 2 VIEW COMPARISON:  03/10/2017, CT chest 03/28/2018 FINDINGS: Sternotomy changes. Patient is rotated. Small bilateral pleural effusions. Hazy airspace disease at the bases. Cardiomegaly with vascular congestion and mild diffuse interstitial opacity consistent with edema. Aortic atherosclerosis. No pneumothorax. Old right-sided rib deformities. Emphysematous disease. IMPRESSION: 1. Cardiomegaly with vascular congestion, mild interstitial pulmonary edema, and small bilateral pleural effusions. 2. Hazy airspace disease at the bases may reflect atelectasis or edema. Electronically Signed   By: Jasmine Pang M.D.   On: 06/26/2018 20:11   Dg Lumbar Spine Complete  Result Date: 07/16/2018 CLINICAL DATA:  84 year old currently in rehabilitation who fell approximately 3 weeks ago while walking with her walker. Progressive low back pain, RIGHT hip pain and RIGHT UPPER leg pain. Subsequent encounter. EXAM: LUMBAR SPINE - COMPLETE 4+ VIEW COMPARISON:  None. FINDINGS: Five non-rib-bearing lumbar vertebrae with anatomic alignment. No fractures. Osseous demineralization. Severe disc space narrowing at L3-4 and L4-5. Remaining disc spaces well-preserved. Severe LEFT and mild RIGHT facet degenerative changes at L4-5 and L5-S1. No pars defects. Aortoiliac atherosclerosis with evidence of a distal abdominal aortic aneurysm measuring up to approximately 3.5 cm diameter. IMPRESSION: 1. No acute osseous abnormality. 2. Osseous demineralization. 3. Severe degenerative disc disease at L3-4 and L4-5. 4. Severe LEFT and mild RIGHT facet degenerative changes at L4-5 and L5-S1. 5.  Aortic Atherosclerosis (ICD10-I70.0). 6. Aortic aneurysm NOS, measuring up to approximately 3.5 cm diameter. (ICD10-I71.9). Recommend followup by ultrasound in 3 years. This recommendation follows ACR consensus guidelines: White Paper of the ACR Incidental Findings Committee II  on Vascular Findings. J Am Coll Radiol 2013; 10:789-794. Electronically Signed   By: Hulan Saas M.D.   On: 07/16/2018 12:30   Ct Hip Right Wo Contrast  Result Date: 07/16/2018 CLINICAL DATA:  Right hip fracture. EXAM: CT OF THE RIGHT HIP WITHOUT CONTRAST TECHNIQUE: Multidetector CT imaging of the right hip was performed according to the standard protocol. Multiplanar CT image reconstructions were also generated. COMPARISON:  Right hip x-rays from same day. CT right hip dated June 26, 2018. FINDINGS: Bones/Joint/Cartilage Subacute common nondisplaced fracture of the right subcapital femoral neck with mild sclerosis of the fracture line. No dislocation. Normal alignment. Small joint effusion. Severe osteopenia. Old right superior inferior pubic rami fractures. Mild right hip osteoarthritis. The Ligaments Ligaments are suboptimally evaluated by CT. Muscles and Tendons Mild diffuse muscle atrophy. Moderate atrophy of the gluteus minimus muscle. Soft tissue No fluid collection or hematoma.  No soft tissue mass. The visualized internal pelvic contents demonstrate small right inguinal hernia now containing only fat. Sigmoid diverticulosis. Vascular calcifications. IMPRESSION: 1. Subacute, minimally impacted, subcapital right femoral neck fracture. Electronically Signed   By: Obie Dredge M.D.   On: 07/16/2018 17:54   Ct Hip Right Wo Contrast  Result Date: 06/26/2018 CLINICAL DATA:  Right hip pain and shortness of breath. Abnormal radiographs of  right hip. EXAM: CT OF THE RIGHT HIP WITHOUT CONTRAST TECHNIQUE: Multidetector CT imaging of the right hip was performed according to the standard protocol. Multiplanar CT image reconstructions were also generated. COMPARISON:  Right hip radiographs 06/26/2018 FINDINGS: Bones/Joint/Cartilage Degenerative changes in the right hip with osteophytes on the acetabular and femoral surfaces. No evidence of acute fracture or dislocation of the right hip. Plain radiographic  finding of cortical step-off likely represented osteophyte formation. Old healed fracture deformities are demonstrated in the right superior and inferior pubic rami. There is a small right hip joint effusion. Ligaments Suboptimally assessed by CT. Muscles and Tendons Mild muscle atrophy. No intramuscular mass or hematoma suggested. Soft tissues The bladder is distended, possibly physiologic or possibly due to urinary retention. Vascular calcifications are present. There is a small right inguinal hernia containing small bowel without apparent proximal obstruction. IMPRESSION: No acute fracture or dislocation of the right hip. Degenerative changes in the right hip. Small right hip joint effusion. Small right inguinal hernia containing small bowel without apparent proximal obstruction. Electronically Signed   By: Burman Nieves M.D.   On: 06/26/2018 23:56   Dg Abd Acute W/chest  Result Date: 07/02/2018 CLINICAL DATA:  Nausea vomiting for 2 days. Aortic valve replacement. Ex-smoker. EXAM: DG ABDOMEN ACUTE W/ 1V CHEST COMPARISON:  Chest radiograph 06/26/2018 FINDINGS: Frontal view of the chest demonstrates midline trachea. Patient rotated to the right. Moderate cardiomegaly. Prior median sternotomy. Atherosclerosis in the transverse aorta. Artifact projecting over the right lung base, without consolidation on upright abdominal image. No pleural effusion or pneumothorax. No congestive failure. Chronic interstitial thickening. Left base scarring. Osteopenia. Abdominal films demonstrate no free intraperitoneal air on upright positioning. Numerous leads and wires project over the upper abdomen and lower chest. No gaseous distention of bowel loops on supine imaging. Aortic atherosclerosis. IMPRESSION: No acute findings. Cardiomegaly without congestive failure. Aortic Atherosclerosis (ICD10-I70.0). Electronically Signed   By: Jeronimo Greaves M.D.   On: 07/02/2018 13:15   Dg Hip Unilat With Pelvis 2-3 Views Right  Result  Date: 06/26/2018 CLINICAL DATA:  Right hip and groin pain after a fall EXAM: DG HIP (WITH OR WITHOUT PELVIS) 2-3V RIGHT COMPARISON:  None. FINDINGS: Limited evaluation of the right femoral neck due to superimposition of trochanter. Possible step-off deformity at the right femoral head neck junction. Old appearing fracture deformity of the right superior and inferior pubic rami. IMPRESSION: 1. Limited evaluation of femoral neck due to positioning, questionable step-off deformity at the right femoral head neck junction, suggest CT to better evaluate. 2. Old appearing fractures of the right superior and inferior pubic rami. Electronically Signed   By: Jasmine Pang M.D.   On: 06/26/2018 20:13   Dg Hip Unilat With Pelvis Min 4 Views Right  Result Date: 07/16/2018 CLINICAL DATA:  84 year old currently in rehabilitation who fell approximately 3 weeks ago while walking with her walker. Progressive low back pain, RIGHT hip pain and RIGHT UPPER leg pain. Subsequent encounter. EXAM: DG HIP (WITH OR WITHOUT PELVIS) 4+V RIGHT COMPARISON:  AP pelvis and RIGHT hip x-ray 06/26/2018. RIGHT hip CT 06/26/2018. FINDINGS: Since the prior examinations 06/26/2018, development of a new sclerotic line involving the subcapital location of the RIGHT femoral neck. No evidence of acute or subacute fractures elsewhere. Mild axial joint space narrowing as noted previously. Osseous demineralization. Included AP pelvis again demonstrates remote healed fractures involving the RIGHT SUPERIOR and INFERIOR pubic rami. Symmetric mild axial joint space narrowing in the contralateral LEFT hip. Sacroiliac joints and symphysis  pubis intact without evidence of diastasis. IMPRESSION: 1. New sclerotic line involving the subcapital location of the RIGHT femoral neck, likely indicating a subacute, minimally impacted nondisplaced fracture which was inconspicuous on the prior imaging 06/26/2018. 2. Symmetric mild osteoarthritis in both hips. 3. Osseous  demineralization. 4. Remote healed fractures involving the RIGHT SUPERIOR and INFERIOR pubic rami. Electronically Signed   By: Hulan Saas M.D.   On: 07/16/2018 12:23   Dg Femur, Min 2 Views Right  Result Date: 07/16/2018 CLINICAL DATA:  84 year old currently in rehabilitation who fell approximately 3 weeks ago while walking with her walker. Progressive low back pain, RIGHT hip pain and RIGHT UPPER leg pain. Subsequent encounter. EXAM: RIGHT FEMUR 2 VIEWS COMPARISON:  RIGHT hip x-rays obtained concurrently and 06/26/2018. No prior femur imaging. FINDINGS: Osseous demineralization. Sclerotic line involving the subcapital location of the RIGHT femoral neck as described in detail on the concurrent hip imaging. No fractures elsewhere involving the femur. Knee joint intact with moderate MEDIAL and LATERAL compartment joint space narrowing. IMPRESSION: 1. Subacute subcapital RIGHT femoral neck fracture as detailed on the concurrent hip imaging. 2. No fractures elsewhere involving the femur. 3. Osteoarthritis involving the RIGHT knee. Electronically Signed   By: Hulan Saas M.D.   On: 07/16/2018 12:25    Time Spent in minutes  30   Susa Raring M.D on 07/17/2018 at 12:01 PM  To page go to [email or web address] - password [email or web address]

## 2018-07-18 ENCOUNTER — Other Ambulatory Visit: Payer: Self-pay

## 2018-07-18 ENCOUNTER — Inpatient Hospital Stay (HOSPITAL_COMMUNITY): Payer: Medicare Other | Admitting: Anesthesiology

## 2018-07-18 ENCOUNTER — Inpatient Hospital Stay (HOSPITAL_COMMUNITY): Payer: Medicare Other

## 2018-07-18 ENCOUNTER — Encounter (HOSPITAL_COMMUNITY)
Admission: EM | Disposition: A | Discharge status: Skilled Nursing Facility | Payer: Self-pay | Source: Home / Self Care | Attending: Internal Medicine

## 2018-07-18 ENCOUNTER — Encounter (HOSPITAL_COMMUNITY)
Admission: RE | Admit: 2018-07-18 | Discharge: 2018-07-18 | Disposition: A | Discharge status: Home / Self Care | Payer: Medicare Other | Source: Skilled Nursing Facility | Attending: Internal Medicine | Admitting: Internal Medicine

## 2018-07-18 ENCOUNTER — Other Ambulatory Visit: Payer: Self-pay | Admitting: *Deleted

## 2018-07-18 ENCOUNTER — Encounter (HOSPITAL_COMMUNITY): Payer: Self-pay | Admitting: Certified Registered"

## 2018-07-18 ENCOUNTER — Ambulatory Visit: Payer: Self-pay | Admitting: Pharmacist

## 2018-07-18 DIAGNOSIS — Z7901 Long term (current) use of anticoagulants: Secondary | ICD-10-CM | POA: Insufficient documentation

## 2018-07-18 DIAGNOSIS — I5022 Chronic systolic (congestive) heart failure: Secondary | ICD-10-CM

## 2018-07-18 DIAGNOSIS — Z9181 History of falling: Secondary | ICD-10-CM | POA: Insufficient documentation

## 2018-07-18 DIAGNOSIS — E43 Unspecified severe protein-calorie malnutrition: Secondary | ICD-10-CM

## 2018-07-18 DIAGNOSIS — Z951 Presence of aortocoronary bypass graft: Secondary | ICD-10-CM | POA: Insufficient documentation

## 2018-07-18 DIAGNOSIS — D508 Other iron deficiency anemias: Secondary | ICD-10-CM

## 2018-07-18 DIAGNOSIS — I5032 Chronic diastolic (congestive) heart failure: Secondary | ICD-10-CM | POA: Insufficient documentation

## 2018-07-18 HISTORY — PX: HIP PINNING,CANNULATED: SHX1758

## 2018-07-18 LAB — BASIC METABOLIC PANEL
ANION GAP: 5 (ref 5–15)
BUN: 32 mg/dL — ABNORMAL HIGH (ref 8–23)
CHLORIDE: 110 mmol/L (ref 98–111)
CO2: 28 mmol/L (ref 22–32)
Calcium: 8.3 mg/dL — ABNORMAL LOW (ref 8.9–10.3)
Creatinine, Ser: 1.11 mg/dL — ABNORMAL HIGH (ref 0.44–1.00)
GFR calc Af Amer: 50 mL/min — ABNORMAL LOW (ref >60)
GFR calc non Af Amer: 43 mL/min — ABNORMAL LOW (ref >60)
Glucose, Bld: 98 mg/dL (ref 70–99)
Potassium: 4.2 mmol/L (ref 3.5–5.1)
SODIUM: 143 mmol/L (ref 135–145)

## 2018-07-18 LAB — PROTIME-INR
INR: 1.66
PROTHROMBIN TIME: 19.4 s — AB (ref 11.4–15.2)

## 2018-07-18 LAB — CBC
HEMATOCRIT: 24.4 % — AB (ref 36.0–46.0)
HEMOGLOBIN: 7.4 g/dL — AB (ref 12.0–15.0)
MCH: 29.6 pg (ref 26.0–34.0)
MCHC: 30.3 g/dL (ref 30.0–36.0)
MCV: 97.6 fL (ref 78.0–100.0)
Platelets: 235 10*3/uL (ref 150–400)
RBC: 2.5 MIL/uL — AB (ref 3.87–5.11)
RDW: 13.6 % (ref 11.5–15.5)
WBC: 6.4 10*3/uL (ref 4.0–10.5)

## 2018-07-18 LAB — HEPARIN LEVEL (UNFRACTIONATED)

## 2018-07-18 LAB — PREPARE RBC (CROSSMATCH)

## 2018-07-18 SURGERY — FIXATION, FEMUR, NECK, PERCUTANEOUS, USING SCREW
Anesthesia: General | Site: Hip | Laterality: Right

## 2018-07-18 MED ORDER — ONDANSETRON HCL 4 MG/2ML IJ SOLN
4.0000 mg | Freq: Once | INTRAMUSCULAR | Status: AC
  Administered 2018-07-18: 4 mg via INTRAVENOUS

## 2018-07-18 MED ORDER — DEXAMETHASONE SODIUM PHOSPHATE 10 MG/ML IJ SOLN
INTRAMUSCULAR | Status: AC
  Filled 2018-07-18: qty 1

## 2018-07-18 MED ORDER — PHENYLEPHRINE HCL 10 MG/ML IJ SOLN: INTRAMUSCULAR | Status: DC | PRN

## 2018-07-18 MED ORDER — FENTANYL CITRATE (PF) 100 MCG/2ML IJ SOLN
INTRAMUSCULAR | Status: AC
  Administered 2018-07-18: 50 ug via INTRAVENOUS
  Filled 2018-07-18: qty 2

## 2018-07-18 MED ORDER — SUGAMMADEX SODIUM 200 MG/2ML IV SOLN
INTRAVENOUS | Status: DC | PRN
  Administered 2018-07-18: 100 mg via INTRAVENOUS

## 2018-07-18 MED ORDER — PROPOFOL 10 MG/ML IV BOLUS
INTRAVENOUS | Status: DC | PRN
  Administered 2018-07-18: 80 mg via INTRAVENOUS

## 2018-07-18 MED ORDER — CEFAZOLIN SODIUM-DEXTROSE 2-4 GM/100ML-% IV SOLN
2.0000 g | Freq: Two times a day (BID) | INTRAVENOUS | Status: AC
  Administered 2018-07-18 – 2018-07-19 (×3): 2 g via INTRAVENOUS
  Filled 2018-07-18 (×3): qty 100

## 2018-07-18 MED ORDER — CHLORHEXIDINE GLUCONATE 4 % EX LIQD: 60.0000 mL | Freq: Once | CUTANEOUS | Status: DC

## 2018-07-18 MED ORDER — POVIDONE-IODINE 10 % EX SWAB: 2.0000 "application " | Freq: Once | CUTANEOUS | Status: DC

## 2018-07-18 MED ORDER — CEFAZOLIN SODIUM-DEXTROSE 2-4 GM/100ML-% IV SOLN
2.0000 g | INTRAVENOUS | Status: AC
  Administered 2018-07-18: 2 g via INTRAVENOUS

## 2018-07-18 MED ORDER — SODIUM CHLORIDE 0.9% IV SOLUTION: Freq: Once | INTRAVENOUS | Status: DC

## 2018-07-18 MED ORDER — FENTANYL CITRATE (PF) 100 MCG/2ML IJ SOLN
25.0000 ug | INTRAMUSCULAR | Status: DC | PRN
  Administered 2018-07-18 (×2): 50 ug via INTRAVENOUS
  Administered 2018-07-18: 25 ug via INTRAVENOUS

## 2018-07-18 MED ORDER — FENTANYL CITRATE (PF) 100 MCG/2ML IJ SOLN
INTRAMUSCULAR | Status: DC | PRN
  Administered 2018-07-18 (×6): 25 ug via INTRAVENOUS

## 2018-07-18 MED ORDER — LIDOCAINE 2% (20 MG/ML) 5 ML SYRINGE
INTRAMUSCULAR | Status: AC
  Filled 2018-07-18: qty 5

## 2018-07-18 MED ORDER — GLYCOPYRROLATE PF 0.2 MG/ML IJ SOSY
PREFILLED_SYRINGE | INTRAMUSCULAR | Status: DC | PRN
  Administered 2018-07-18: .2 mg via INTRAVENOUS

## 2018-07-18 MED ORDER — ONDANSETRON HCL 4 MG/2ML IJ SOLN
INTRAMUSCULAR | Status: DC | PRN
  Administered 2018-07-18: 4 mg via INTRAVENOUS

## 2018-07-18 MED ORDER — SODIUM CHLORIDE 0.9 % IV SOLN
INTRAVENOUS | Status: DC | PRN
  Administered 2018-07-18: 10 ug/min via INTRAVENOUS

## 2018-07-18 MED ORDER — HEPARIN (PORCINE) IN NACL 100-0.45 UNIT/ML-% IJ SOLN
900.0000 [IU]/h | INTRAMUSCULAR | Status: DC
  Administered 2018-07-18: 600 [IU]/h via INTRAVENOUS
  Administered 2018-07-21: 900 [IU]/h via INTRAVENOUS
  Filled 2018-07-18 (×3): qty 250

## 2018-07-18 MED ORDER — EPHEDRINE 5 MG/ML INJ
INTRAVENOUS | Status: AC
  Filled 2018-07-18: qty 10

## 2018-07-18 MED ORDER — FENTANYL CITRATE (PF) 250 MCG/5ML IJ SOLN
INTRAMUSCULAR | Status: AC
  Filled 2018-07-18: qty 5

## 2018-07-18 MED ORDER — HEPARIN (PORCINE) IN NACL 100-0.45 UNIT/ML-% IJ SOLN
600.0000 [IU]/h | INTRAMUSCULAR | Status: AC
  Filled 2018-07-18: qty 250

## 2018-07-18 MED ORDER — ONDANSETRON HCL 4 MG/2ML IJ SOLN
INTRAMUSCULAR | Status: AC
  Filled 2018-07-18: qty 2

## 2018-07-18 MED ORDER — ROCURONIUM BROMIDE 50 MG/5ML IV SOSY
PREFILLED_SYRINGE | INTRAVENOUS | Status: AC
  Filled 2018-07-18: qty 5

## 2018-07-18 MED ORDER — GLYCOPYRROLATE 0.2 MG/ML IJ SOLN: INTRAMUSCULAR | Status: DC | PRN

## 2018-07-18 MED ORDER — FENTANYL CITRATE (PF) 100 MCG/2ML IJ SOLN
INTRAMUSCULAR | Status: AC
  Filled 2018-07-18: qty 2

## 2018-07-18 MED ORDER — SODIUM CHLORIDE 0.9 % IV SOLN
INTRAVENOUS | Status: DC | PRN
  Administered 2018-07-18: 11:00:00 via INTRAVENOUS

## 2018-07-18 MED ORDER — PROPOFOL 10 MG/ML IV BOLUS
INTRAVENOUS | Status: AC
  Filled 2018-07-18: qty 20

## 2018-07-18 MED ORDER — LIDOCAINE 2% (20 MG/ML) 5 ML SYRINGE
INTRAMUSCULAR | Status: DC | PRN
  Administered 2018-07-18: 50 mg via INTRAVENOUS

## 2018-07-18 MED ORDER — DEXAMETHASONE SODIUM PHOSPHATE 10 MG/ML IJ SOLN
INTRAMUSCULAR | Status: DC | PRN
  Administered 2018-07-18: 5 mg via INTRAVENOUS

## 2018-07-18 MED ORDER — GLYCOPYRROLATE PF 0.2 MG/ML IJ SOSY
PREFILLED_SYRINGE | INTRAMUSCULAR | Status: AC
  Filled 2018-07-18: qty 1

## 2018-07-18 MED ORDER — METOCLOPRAMIDE HCL 5 MG/ML IJ SOLN
INTRAMUSCULAR | Status: AC
  Filled 2018-07-18: qty 2

## 2018-07-18 MED ORDER — 0.9 % SODIUM CHLORIDE (POUR BTL) OPTIME
TOPICAL | Status: DC | PRN
  Administered 2018-07-18: 1000 mL

## 2018-07-18 MED ORDER — PHENYLEPHRINE 40 MCG/ML (10ML) SYRINGE FOR IV PUSH (FOR BLOOD PRESSURE SUPPORT)
PREFILLED_SYRINGE | INTRAVENOUS | Status: AC
  Filled 2018-07-18: qty 10

## 2018-07-18 MED ORDER — EPHEDRINE SULFATE 50 MG/ML IJ SOLN
INTRAMUSCULAR | Status: DC | PRN
  Administered 2018-07-18: 5 mg via INTRAVENOUS

## 2018-07-18 MED ORDER — CEFAZOLIN SODIUM-DEXTROSE 2-4 GM/100ML-% IV SOLN
INTRAVENOUS | Status: AC
  Filled 2018-07-18: qty 100

## 2018-07-18 MED ORDER — METOCLOPRAMIDE HCL 5 MG/ML IJ SOLN
5.0000 mg | Freq: Once | INTRAMUSCULAR | Status: AC
  Administered 2018-07-18: 5 mg via INTRAVENOUS

## 2018-07-18 MED ORDER — ROCURONIUM BROMIDE 50 MG/5ML IV SOSY
PREFILLED_SYRINGE | INTRAVENOUS | Status: DC | PRN
  Administered 2018-07-18: 50 mg via INTRAVENOUS

## 2018-07-18 MED ORDER — LACTATED RINGERS IV SOLN
INTRAVENOUS | Status: DC
  Administered 2018-07-18 – 2018-07-21 (×3): via INTRAVENOUS

## 2018-07-18 SURGICAL SUPPLY — 39 items
BRUSH SCRUB SURG 4.25 DISP (MISCELLANEOUS) ×6 IMPLANT
COVER PERINEAL POST (MISCELLANEOUS) ×3 IMPLANT
COVER SURGICAL LIGHT HANDLE (MISCELLANEOUS) ×6 IMPLANT
DRAPE C-ARMOR (DRAPES) ×3 IMPLANT
DRAPE STERI IOBAN 125X83 (DRAPES) ×3 IMPLANT
DRSG MEPILEX BORDER 4X4 (GAUZE/BANDAGES/DRESSINGS) ×3 IMPLANT
ELECT REM PT RETURN 9FT ADLT (ELECTROSURGICAL) ×3
ELECTRODE REM PT RTRN 9FT ADLT (ELECTROSURGICAL) ×1 IMPLANT
GLOVE BIO SURGEON STRL SZ7.5 (GLOVE) ×3 IMPLANT
GLOVE BIO SURGEON STRL SZ8 (GLOVE) ×3 IMPLANT
GLOVE BIOGEL PI IND STRL 7.5 (GLOVE) ×1 IMPLANT
GLOVE BIOGEL PI IND STRL 8 (GLOVE) ×1 IMPLANT
GLOVE BIOGEL PI INDICATOR 7.5 (GLOVE) ×2
GLOVE BIOGEL PI INDICATOR 8 (GLOVE) ×2
GOWN STRL REUS W/ TWL LRG LVL3 (GOWN DISPOSABLE) ×2 IMPLANT
GOWN STRL REUS W/ TWL XL LVL3 (GOWN DISPOSABLE) ×1 IMPLANT
GOWN STRL REUS W/TWL LRG LVL3 (GOWN DISPOSABLE) ×6
GOWN STRL REUS W/TWL XL LVL3 (GOWN DISPOSABLE) ×3
GUIDEWIRE THREADED 2.8 (WIRE) ×6 IMPLANT
KIT BASIN OR (CUSTOM PROCEDURE TRAY) ×3 IMPLANT
KIT TURNOVER KIT B (KITS) ×3 IMPLANT
LINER BOOT UNIVERSAL DISP (MISCELLANEOUS) ×3 IMPLANT
MANIFOLD NEPTUNE II (INSTRUMENTS) ×3 IMPLANT
NS IRRIG 1000ML POUR BTL (IV SOLUTION) ×3 IMPLANT
PACK GENERAL/GYN (CUSTOM PROCEDURE TRAY) ×3 IMPLANT
PAD ARMBOARD 7.5X6 YLW CONV (MISCELLANEOUS) ×6 IMPLANT
SCREW CANN 16 THRD/70 7.3 (Screw) ×4 IMPLANT
SCREW CANN 16 THRD/80 7.3 (Screw) ×2 IMPLANT
STAPLER VISISTAT 35W (STAPLE) ×3 IMPLANT
SUT VIC AB 0 CT1 27 (SUTURE) ×3
SUT VIC AB 0 CT1 27XBRD ANBCTR (SUTURE) ×1 IMPLANT
SUT VIC AB 1 CT1 27 (SUTURE) ×3
SUT VIC AB 1 CT1 27XBRD ANBCTR (SUTURE) ×1 IMPLANT
SUT VIC AB 2-0 CT1 27 (SUTURE) ×3
SUT VIC AB 2-0 CT1 TAPERPNT 27 (SUTURE) ×1 IMPLANT
TOWEL OR 17X24 6PK STRL BLUE (TOWEL DISPOSABLE) ×3 IMPLANT
TOWEL OR 17X26 10 PK STRL BLUE (TOWEL DISPOSABLE) ×6 IMPLANT
WASHER FOR 5.0 SCREWS (Washer) ×2 IMPLANT
WATER STERILE IRR 1000ML POUR (IV SOLUTION) ×3 IMPLANT

## 2018-07-18 NOTE — Anesthesia Postprocedure Evaluation (Signed)
Anesthesia Post Note  Patient: [name]  Procedure(s) Performed: RIGHT CANNULATED HIP PINNING (Right Hip)     Patient location during evaluation: PACU Anesthesia Type: General Level of consciousness: awake Pain management: pain level controlled Vital Signs Assessment: post-procedure vital signs reviewed and stable Respiratory status: spontaneous breathing Cardiovascular status: stable Postop Assessment: no apparent nausea or vomiting Anesthetic complications: no    Last Vitals:  Vitals:   07/18/18 1529 07/18/18 1615  BP: (!) 171/45   Pulse: (!) 49 (!) 53  Resp: 12   Temp: (!) 35.8 C   SpO2: 96%     Last Pain:  Vitals:   07/18/18 1529  TempSrc: Rectal                 Tharon Bomar

## 2018-07-18 NOTE — Progress Notes (Signed)
I discussed with the patient the risks and benefits of surgery for her right hip, including the possibility of infection, nerve injury, vessel injury, wound breakdown, arthritis, symptomatic hardware, DVT/ PE, loss of motion, malunion, nonunion, and need for further surgery among others. She acknowledged these risks and wished to proceed.  Myrene Galas, MD [facility], [facility] [phone]

## 2018-07-18 NOTE — Op Note (Signed)
07/18/2018  12:34 PM  PATIENT:  [name]  84 y.o. female  PRE-OPERATIVE DIAGNOSIS:  Right femoral neck fracture  POST-OPERATIVE DIAGNOSIS:  Right femoral neck fracture  PROCEDURE:  Procedure(s): RIGHT CANNULATED HIP PINNING (Right)  SURGEON:  Surgeon(s) and Role:    Myrene Galas, MD - Primary  ASSISTANTS: none   ANESTHESIA:   general  EBL:  50 mL   BLOOD ADMINISTERED:700 CC PRBC  DRAINS: none   LOCAL MEDICATIONS USED:  NONE  SPECIMEN:  No Specimen  DISPOSITION OF SPECIMEN:  N/A  COUNTS:  YES  TOURNIQUET:  * No tourniquets in log *  DICTATION: .Note written in EPIC  PLAN OF CARE: Admit to inpatient   PATIENT DISPOSITION:  PACU - hemodynamically stable.   Delay start of Pharmacological VTE agent (>24hrs) due to surgical blood loss or risk of bleeding: no   BRIEF SUMMARY OF INDICATIONS FOR PROCEDURE:  [name] is a very pleasant 84 y.o. who has had persistent, progressive right hip pain for three weeks. Plain film and CT scan show nondisplaced fracture. I discussed with patient, daughter, and granddaughter the risks and benefits of surgical fixation including the possibility of avascular necrosis, nonunion, malunion, loss of fixation, need for conversion to total hip arthroplasty or other surgery, DVT, PE, heart attack, stroke, loss of motion, and multiple others including infection, and symptomatic hardware.  After full discussion of these risks and others, the patient wished to proceed.  DESCRIPTION OF PROCEDURE:  The patient was taken to the operating room where general anesthesia was induced.  Preoperative antibiotics consisting of test dose first and then full dose of Ancef were administered without any allergic symptoms noted. The patient was very carefully positioned on the radiolucent table with a bump under the pelvis on the side of the fracture. C-arm was brought in to confirm appropriate images and reduction. Standard prep and drape were  then performed using chlorhexidine scrub.  C-arm was brought in to confirm the appropriate starting position for a 3 cm incision and checked on both AP and lateral planes. The incision was made.  Dissection was carried carefully down to the tensor which was split in line to expose the vastus lateralis.  The guide pin for these screws was then placed inferior and central and advanced along the calcar into the femoral head checking it on 2 views. Two additional guide pins were then placed superiorly, one superior anterior and the other superior posterior. I began with placement of the two superior screws on the tension side of the fracture. I dridrilled the lateral cortex, then advanced the threads across the fracture site and engaged it, checking under C-arm for compression.  I did use a washer on the inferior screw which had adequate separation to allow this. I achieved excellent fixation with inverted triangle pattern across the femoral neck into the head.  Wound was irrigated thoroughly.  I did split and spread the tensor and was able to repair this back with a simple 0 Vicryl sutures.  The deep subcu was repaired with #1 Vicryl and then a 2-0 Vicryl and a 3-0 nylon horizontal mattress for the skin layer.  Sterile gently compressive dressing was applied.  The patient was awakened from anesthesia and transported to the PACU in stable condition.   PROGNOSIS:  Patient will be partial weightbearing on the right lower extremity with walker and will be allowed to proceed with discharge to home as soon cleared by PT.  Anticipate being on DVT  prophylaxis with Lovenox and touchdown weightbearing for 6 weeks.  Would anticipate some form of bicycle use within a couple of weeks.  She is at risk for nonunion and avascular necrosis with the potential for subsequent total hip arthroplasty conversion.  The patient and the family are aware of this.     Doralee Albino. Carola Frost, M.D.

## 2018-07-18 NOTE — Anesthesia Preprocedure Evaluation (Signed)
Anesthesia Evaluation  Patient identified by MRN, date of birth, ID band Patient awake    Reviewed: Allergy & Precautions, NPO status , Patient's Chart, lab work & pertinent test results  Airway Mallampati: II  TM Distance: >3 FB     Dental   Pulmonary pneumonia, COPD, former smoker,    breath sounds clear to auscultation       Cardiovascular hypertension, + CAD, + Peripheral Vascular Disease and +CHF  + dysrhythmias  Rhythm:Regular Rate:Normal     Neuro/Psych    GI/Hepatic negative GI ROS, Neg liver ROS,   Endo/Other  negative endocrine ROS  Renal/GU negative Renal ROS     Musculoskeletal   Abdominal   Peds  Hematology  (+) anemia ,   Anesthesia Other Findings   Reproductive/Obstetrics                             Anesthesia Physical Anesthesia Plan  ASA: III  Anesthesia Plan: General   Post-op Pain Management:    Induction: Intravenous  PONV Risk Score and Plan: Treatment may vary due to age or medical condition  Airway Management Planned: Oral ETT  Additional Equipment:   Intra-op Plan:   Post-operative Plan: Possible Post-op intubation/ventilation  Informed Consent: I have reviewed the patients History and Physical, chart, labs and discussed the procedure including the risks, benefits and alternatives for the proposed anesthesia with the patient or authorized representative who has indicated his/her understanding and acceptance.   Dental advisory given  Plan Discussed with: CRNA and Anesthesiologist  Anesthesia Plan Comments:         Anesthesia Quick Evaluation

## 2018-07-18 NOTE — Progress Notes (Signed)
PHARMACY NOTE:  ANTIMICROBIAL RENAL DOSAGE ADJUSTMENT  Current antimicrobial regimen includes a mismatch between antimicrobial dosage and estimated renal function.  As per policy approved by the Pharmacy & Therapeutics and Medical Executive Committees, the antimicrobial dosage will be adjusted accordingly.  Current antimicrobial dosage:  Cefazolin 2 gm IV q8h x 3  Indication: Surgical prophylaxis  Renal Function:  Estimated Creatinine Clearance: 24.7 mL/min (A) (by C-G formula based on SCr of 1.11 mg/dL (H)). []      On intermittent HD, scheduled: []      On CRRT    Antimicrobial dosage has been changed to:  Cefazolin 2 gm IV q12h x 3  Additional comments:  Krystelle Prashad A. Jeanella Craze, PharmD, BCPS Clinical Pharmacist [place] Pager: [phone] Please utilize Amion for appropriate phone number to reach the unit pharmacist [facility])    07/18/2018 3:11 PM

## 2018-07-18 NOTE — Progress Notes (Signed)
ANTICOAGULATION CONSULT NOTE  Pharmacy Consult for Heparin Indication: AVR  Allergies  Allergen Reactions  . Codeine Nausea And Vomiting  . Penicillins Other (See Comments)    Unknown reaction - listed on MAR Has patient had a PCN reaction causing immediate rash, facial/tongue/throat swelling, SOB or lightheadedness with hypotension: Unknown Has patient had a PCN reaction causing severe rash involving mucus membranes or skin necrosis: Unknown Has patient had a PCN reaction that required hospitalization: Unknown Has patient had a PCN reaction occurring within the last 10 years: Unknown If all of the above answers are "NO", then may proceed with Cephalosporin use.    Patient Measurements: Height: 5\' 2"  (157.5 cm) Weight: 100 lb 9.6 oz (45.6 kg) IBW/kg (Calculated) : 50.1 Heparin Dosing Weight:  45.6 kg  Vital Signs: Temp: 97.5 F (36.4 C) (09/03 2114) Temp Source: Oral (09/03 2114) BP: 128/55 (09/03 2114) Pulse Rate: 60 (09/03 2114)  Labs: Recent Labs    07/16/18 0420 07/16/18 1617 07/17/18 0353 07/18/18 0315 07/18/18 2209  HGB  --  8.7*  --  7.4*  --   HCT  --  28.5*  --  24.4*  --   PLT  --  283  --  235  --   LABPROT 27.8*  --  23.4* 19.4*  --   INR 2.63  --  2.10 1.66  --   HEPARINUNFRC  --   --   --   --  <0.10*  CREATININE  --  1.34*  --  1.11*  --     Estimated Creatinine Clearance: 24.7 mL/min (A) (by C-G formula based on SCr of 1.11 mg/dL (H)).   Assessment: 84 y.o. female s/p hip fracture/repair, h/o mechanical AVR and Coumadin on hold, for heparin  Goal of Therapy:  Heparin level 0.3-0.7 units/ml Monitor platelets by anticoagulation protocol: Yes   Plan:  Increase Heparin 750 units/hr Follow-up am labs.   Eddie Candle 07/18/2018,11:24 PM

## 2018-07-18 NOTE — Progress Notes (Signed)
ANTICOAGULATION CONSULT NOTE  Pharmacy Consult:  Heparin Indication:  History of AVR   Allergies  Allergen Reactions  . Codeine Nausea And Vomiting  . Penicillins Other (See Comments)    Unknown reaction - listed on MAR Has patient had a PCN reaction causing immediate rash, facial/tongue/throat swelling, SOB or lightheadedness with hypotension: Unknown Has patient had a PCN reaction causing severe rash involving mucus membranes or skin necrosis: Unknown Has patient had a PCN reaction that required hospitalization: Unknown Has patient had a PCN reaction occurring within the last 10 years: Unknown If all of the above answers are "NO", then may proceed with Cephalosporin use.    Patient Measurements: Height: 5\' 2"  (157.5 cm) Weight: 100 lb 9.6 oz (45.6 kg) IBW/kg (Calculated) : 50.1 Heparin Dosing Weight: 45 kg  Vital Signs: Temp: 97.8 F (36.6 C) (09/03 0614) Temp Source: Oral (09/03 0614) BP: 157/62 (09/03 0614) Pulse Rate: 67 (09/03 0614)  Labs: Recent Labs    07/16/18 0420 07/16/18 1617 07/17/18 0353 07/18/18 0315  HGB  --  8.7*  --  7.4*  HCT  --  28.5*  --  24.4*  PLT  --  283  --  235  LABPROT 27.8*  --  23.4* 19.4*  INR 2.63  --  2.10 1.66  CREATININE  --  1.34*  --  1.11*    Estimated Creatinine Clearance: 24.7 mL/min (A) (by C-G formula based on SCr of 1.11 mg/dL (H)).    Assessment: 42 YOF with history of AVR on Coumadin PTA.  Patient presented with hip pain requiring surgery.  Pharmacy has been consulted to transition patient to IV heparin; however, INR has been therapeutic until this AM.     Goal of Therapy:  Heparin level 0.3-0.7 units/ml Monitor platelets by anticoagulation protocol: Yes    Plan:  Given plan for surgery today, will hold off on starting IV heparin and resume 2 hours post surgery without bolus per Dr. Thedore Mins.   Kaysey Berndt D. Laney Potash, PharmD, BCPS, BCCCP 07/18/2018, 9:32 AM

## 2018-07-18 NOTE — Progress Notes (Signed)
@IPLOG @        PROGRESS NOTE                                                                                                                                                                                                             Patient Demographics:    [name], is a 84 y.o. female, DOB - 1926-09-18, ZOX:[id number]  Admit date - 07/16/2018   Admitting Physician Jonah Blue, MD  Outpatient Primary MD for the patient is Kari Baars, MD  LOS - 2  Chief Complaint  Patient presents with  . Hip Injury       Brief Narrative  [name] is a 84 y.o. female with medical history significant of CVA; HTN; HLD; CAD s/p CABG with aortic valve replacement (1997); chronic combined heart failure (EF 50-55%, grade 1 diastolic dysfunction); and COPD on home O2 presenting with hip pain.  She began having right hip pain in early August, but did not have any kind of fall.  She was active, going to the gym, did not require assistive devices.  The pain progressed and she began having some muscular pain on the right, as well, and so she started using a walker.   She has had some pain in her back and went to the gym the day before.   She fell on August 12. She got up to go to the bathroom that AM - she came out with her walker but it flipped over. She was unable to get herself up.  Her LOC fell and her family brought her to the ER - she was having hip pain and they also diagnosed diastolic CHF exacerbation.  She had imaging including a CT with no fracture at that time.  She was eventually discharged to [facility] for rehab.  She has been at [facility] since and was due to be discharged several days from now.  However, her right hip was continuing to bother her and she was unable to bear weight.  She now cannot walk without a walker, but she has made improvements with PT - just having ongoing pain.   [facility] sent her here for a hip x-ray this AM since the pain was ongoing.  They saw the  fracture on the xray and sent her here for CT and ortho evaluation.   Subjective:   Patient in bed, appears comfortable, denies any headache, no fever, no chest pain or pressure, no shortness of breath , no abdominal pain. No focal weakness.  She is  waiting to go to the OR for surgery.     Assessment  & Plan :     R subacute non-displaced hip fracture  - with history of one mechanical fall, however says there was some pain even prior to the fall, for now orthopedics has seen the patient due for surgery today, kindly see cardiology's recommendation and preop evaluation for all details of perioperative risk.  Request orthopedics to kindly address postop weightbearing and DVT prophylaxis, she most likely can go back on Coumadin. Will require SNF post postop, preferably in [place] where she lives.  CAD status post mechanical aortic valve placement. - She is on Coumadin and therapeutic for her artificial valve (goal 2.5-3.5), currently stable chest pain-free, EKG nonacute, seen and cleared by cardiology this admission.  Continue beta-blocker and statin for secondary prevention.  Hold Coumadin for surgery, pharmacy to bridge with heparin, INR 1.6 this morning, Ortho to clarify postop when it is safe to resume Heparin drip.   HTN  - Continue Coreg, hold ACE inhibitor due to ARF, PRN IV hydralazine and Lopressor as needed.  HLD  - Continue Lipitor  Chronic respiratory failure from COPD, on home O2  - this is due to underlying COPD, currently compensated, continue home nebulized medications and as needed nebulizer, continue oxygen.  No wheezing on exam shortness of breath, no acute issue.   Dementia - Continue Aricept is at risk for delirium.  Minimize narcotics and benzodiazepines.  Anemia of chronic disease.  Stable.  Will type screen and monitor.  CKD 3 with mild ARF.  Hold ACE inhibitor, ARF resolved after gentle hydration creatinine back to baseline.    Family Communication  :  daughter Bedside  Code Status : DNR  Disposition Plan  :    SNF  Consults  :  Cards, Ortho  Procedures  :    Due for ORIF on 07/18/2018   Echocardiogram from March 2019.  - Left ventricle: The cavity size was normal. Wall thickness was  increased in a pattern of mild LVH. Systolic function was normal. The estimated ejection fraction was in the range of 50% to 55%. Doppler parameters are consistent with abnormal left ventricular relaxation (grade 1 diastolic dysfunction). - Aortic valve: A bioprosthesis was present. There was trivial  regurgitation. - Mitral valve: Severely calcified annulus. Valve area by pressure  half-time: 1.82 cm^2. - Left atrium: The atrium was moderately to severely dilated. - Pulmonary arteries: Systolic pressure was mildly to moderately  increased. PA peak pressure: 39 mm Hg (S).  DVT Prophylaxis  : Heparin gtt/Coumadin  Lab Results  Component Value Date   INR 1.66 07/18/2018   INR 2.10 07/17/2018   INR 2.63 07/16/2018   PROTIME 20.5 05/01/2009     Lab Results  Component Value Date   PLT 235 07/18/2018    Diet :  Diet Order            Diet NPO time specified  Diet effective midnight               Inpatient Medications Scheduled Meds: . sodium chloride   Intravenous Once  . [MAR Hold] atorvastatin  40 mg Oral q1800  . [MAR Hold] carvedilol  6.25 mg Oral BID WC  . chlorhexidine  60 mL Topical Once  . [MAR Hold] docusate sodium  100 mg Oral BID  . [MAR Hold] donepezil  10 mg Oral Daily  . [MAR Hold] feeding supplement  1 Container Oral Q24H  . povidone-iodine  2  application Topical Once  . [MAR Hold] pregabalin  75 mg Oral QODAY  . [MAR Hold] umeclidinium-vilanterol  1 puff Inhalation q morning - 10a   Continuous Infusions: . sodium chloride 10 mL/hr at 07/18/18 0902  . ceFAZolin    .  ceFAZolin (ANCEF) IV    . lactated ringers 10 mL/hr at 07/18/18 0948  . [MAR Hold] methocarbamol (ROBAXIN) IV     PRN Meds:.[MAR Hold] albuterol,  [MAR Hold] ALPRAZolam, [MAR Hold] bisacodyl, [MAR Hold] hydrALAZINE, [MAR Hold] HYDROcodone-acetaminophen, [MAR Hold] Melatonin, [MAR Hold] methocarbamol **OR** [MAR Hold] methocarbamol (ROBAXIN) IV, [MAR Hold] metoprolol tartrate, [MAR Hold]  morphine injection, [MAR Hold] polyethylene glycol  Antibiotics  :   Anti-infectives (From admission, onward)   Start     Dose/Rate Route Frequency Ordered Stop   07/18/18 0945  ceFAZolin (ANCEF) IVPB 2g/100 mL premix     2 g 200 mL/hr over 30 Minutes Intravenous On call to O.R. 07/18/18 0941 07/19/18 0559   07/18/18 0942  ceFAZolin (ANCEF) 2-4 GM/100ML-% IVPB    Note to Pharmacy:  Sabino Niemann   : cabinet override      07/18/18 0942 07/18/18 2159          Objective:   Vitals:   07/17/18 1709 07/17/18 1932 07/18/18 0614 07/18/18 0714  BP: (!) 117/38 (!) 132/55 (!) 157/62   Pulse: 67 73 67   Resp:      Temp:  98.5 F (36.9 C) 97.8 F (36.6 C)   TempSrc:  Oral Oral   SpO2:  98% 98% 98%  Weight:      Height:        Wt Readings from Last 3 Encounters:  07/16/18 45.6 kg  07/06/18 46.1 kg  07/04/18 46 kg     Intake/Output Summary (Last 24 hours) at 07/18/2018 1006 Last data filed at 07/17/2018 1800 Gross per 24 hour  Intake 669.8 ml  Output -  Net 669.8 ml     Physical Exam  Awake Alert, Oriented X 3, No new F.N deficits, Normal affect [place].AT,PERRAL Supple Neck,No JVD, No cervical lymphadenopathy appriciated.  Symmetrical Chest wall movement, Good air movement bilaterally, CTAB RRR,No Gallops, Rubs or new Murmurs, No Parasternal Heave +ve B.Sounds, Abd Soft, No tenderness, No organomegaly appriciated, No rebound - guarding or rigidity. No Cyanosis, Clubbing or edema, No new Rash or bruise     Data Review:    CBC Recent Labs  Lab 07/12/18 0753 07/16/18 1617 07/18/18 0315  WBC 7.2 6.9 6.4  HGB 8.6* 8.7* 7.4*  HCT 28.3* 28.5* 24.4*  PLT 322 283 235  MCV 97.6 97.3 97.6  MCH 29.7 29.7 29.6  MCHC 30.4 30.5 30.3  RDW  13.8 14.1 13.6  LYMPHSABS 2.1 1.4  --   MONOABS 0.8 0.8  --   EOSABS 0.2 0.1  --   BASOSABS 0.0 0.0  --     Chemistries  Recent Labs  Lab 07/12/18 0753 07/16/18 1617 07/18/18 0315  NA 142 141 143  K 4.0 4.5 4.2  CL 103 103 110  CO2 32 32 28  GLUCOSE 83 106* 98  BUN 30* 32* 32*  CREATININE 1.09* 1.34* 1.11*  CALCIUM 9.2 9.1 8.3*   ------------------------------------------------------------------------------------------------------------------ No results for input(s): CHOL, HDL, LDLCALC, TRIG, CHOLHDL, LDLDIRECT in the last 72 hours.  No results found for: HGBA1C ------------------------------------------------------------------------------------------------------------------ No results for input(s): TSH, T4TOTAL, T3FREE, THYROIDAB in the last 72 hours.  Invalid input(s): FREET3 ------------------------------------------------------------------------------------------------------------------ No results for input(s): VITAMINB12, FOLATE, FERRITIN, TIBC, IRON, RETICCTPCT  in the last 72 hours.  Coagulation profile Recent Labs  Lab 07/12/18 0753 07/14/18 0715 07/16/18 0420 07/17/18 0353 07/18/18 0315  INR 2.70 2.87 2.63 2.10 1.66    No results for input(s): DDIMER in the last 72 hours.  Cardiac Enzymes No results for input(s): CKMB, TROPONINI, MYOGLOBIN in the last 168 hours.  Invalid input(s): CK ------------------------------------------------------------------------------------------------------------------    Component Value Date/Time   BNP 1,329.0 (H) 06/26/2018 1935    Micro Results Recent Results (from the past 240 hour(s))  Surgical pcr screen     Status: None   Collection Time: 07/17/18  3:57 AM  Result Value Ref Range Status   MRSA, PCR NEGATIVE NEGATIVE Final   Staphylococcus aureus NEGATIVE NEGATIVE Final    Comment: (NOTE) The Xpert SA Assay (FDA approved for NASAL specimens in patients 55 years of age and older), is one component of a  comprehensive surveillance program. It is not intended to diagnose infection nor to guide or monitor treatment. Performed at [facility], [address] [place], [place] [address]     Radiology Reports Dg Chest 1 View  Result Date: 07/16/2018 CLINICAL DATA:  84 year old female with history of right-sided hip fracture. Preoperative evaluation. EXAM: CHEST  1 VIEW COMPARISON:  Chest x-ray 07/02/2018. FINDINGS: Study is exceedingly limited by marked patient rotation to the right. With these limitations in mind, areas no definite acute consolidative airspace disease. No definite pleural effusions. No evidence of pulmonary edema. Heart size appears enlarged. Upper mediastinal contours are grossly distorted by patient's rotation. Aortic status post median sternotomy for CABG and aortic valve replacement. IMPRESSION: 1. Limited examination demonstrating no definite radiographic evidence of acute cardiopulmonary disease. 2. Aortic atherosclerosis. 3. Cardiomegaly. Electronically Signed   By: Trudie Reed M.D.   On: 07/16/2018 18:20   Dg Chest 2 View  Result Date: 06/26/2018 CLINICAL DATA:  Weakness, right hip pain EXAM: CHEST - 2 VIEW COMPARISON:  03/10/2017, CT chest 03/28/2018 FINDINGS: Sternotomy changes. Patient is rotated. Small bilateral pleural effusions. Hazy airspace disease at the bases. Cardiomegaly with vascular congestion and mild diffuse interstitial opacity consistent with edema. Aortic atherosclerosis. No pneumothorax. Old right-sided rib deformities. Emphysematous disease. IMPRESSION: 1. Cardiomegaly with vascular congestion, mild interstitial pulmonary edema, and small bilateral pleural effusions. 2. Hazy airspace disease at the bases may reflect atelectasis or edema. Electronically Signed   By: Jasmine Pang M.D.   On: 06/26/2018 20:11   Dg Lumbar Spine Complete  Result Date: 07/16/2018 CLINICAL DATA:  84 year old currently in rehabilitation who fell approximately 3 weeks ago  while walking with her walker. Progressive low back pain, RIGHT hip pain and RIGHT UPPER leg pain. Subsequent encounter. EXAM: LUMBAR SPINE - COMPLETE 4+ VIEW COMPARISON:  None. FINDINGS: Five non-rib-bearing lumbar vertebrae with anatomic alignment. No fractures. Osseous demineralization. Severe disc space narrowing at L3-4 and L4-5. Remaining disc spaces well-preserved. Severe LEFT and mild RIGHT facet degenerative changes at L4-5 and L5-S1. No pars defects. Aortoiliac atherosclerosis with evidence of a distal abdominal aortic aneurysm measuring up to approximately 3.5 cm diameter. IMPRESSION: 1. No acute osseous abnormality. 2. Osseous demineralization. 3. Severe degenerative disc disease at L3-4 and L4-5. 4. Severe LEFT and mild RIGHT facet degenerative changes at L4-5 and L5-S1. 5.  Aortic Atherosclerosis (ICD10-I70.0). 6. Aortic aneurysm NOS, measuring up to approximately 3.5 cm diameter. (ICD10-I71.9). Recommend followup by ultrasound in 3 years. This recommendation follows ACR consensus guidelines: White Paper of the ACR Incidental Findings Committee II on Vascular Findings. J Am Coll  Radiol 2013; 12:197-588. Electronically Signed   By: Hulan Saas M.D.   On: 07/16/2018 12:30   Ct Hip Right Wo Contrast  Result Date: 07/16/2018 CLINICAL DATA:  Right hip fracture. EXAM: CT OF THE RIGHT HIP WITHOUT CONTRAST TECHNIQUE: Multidetector CT imaging of the right hip was performed according to the standard protocol. Multiplanar CT image reconstructions were also generated. COMPARISON:  Right hip x-rays from same day. CT right hip dated June 26, 2018. FINDINGS: Bones/Joint/Cartilage Subacute common nondisplaced fracture of the right subcapital femoral neck with mild sclerosis of the fracture line. No dislocation. Normal alignment. Small joint effusion. Severe osteopenia. Old right superior inferior pubic rami fractures. Mild right hip osteoarthritis. The Ligaments Ligaments are suboptimally evaluated by CT.  Muscles and Tendons Mild diffuse muscle atrophy. Moderate atrophy of the gluteus minimus muscle. Soft tissue No fluid collection or hematoma.  No soft tissue mass. The visualized internal pelvic contents demonstrate small right inguinal hernia now containing only fat. Sigmoid diverticulosis. Vascular calcifications. IMPRESSION: 1. Subacute, minimally impacted, subcapital right femoral neck fracture. Electronically Signed   By: Obie Dredge M.D.   On: 07/16/2018 17:54   Ct Hip Right Wo Contrast  Result Date: 06/26/2018 CLINICAL DATA:  Right hip pain and shortness of breath. Abnormal radiographs of right hip. EXAM: CT OF THE RIGHT HIP WITHOUT CONTRAST TECHNIQUE: Multidetector CT imaging of the right hip was performed according to the standard protocol. Multiplanar CT image reconstructions were also generated. COMPARISON:  Right hip radiographs 06/26/2018 FINDINGS: Bones/Joint/Cartilage Degenerative changes in the right hip with osteophytes on the acetabular and femoral surfaces. No evidence of acute fracture or dislocation of the right hip. Plain radiographic finding of cortical step-off likely represented osteophyte formation. Old healed fracture deformities are demonstrated in the right superior and inferior pubic rami. There is a small right hip joint effusion. Ligaments Suboptimally assessed by CT. Muscles and Tendons Mild muscle atrophy. No intramuscular mass or hematoma suggested. Soft tissues The bladder is distended, possibly physiologic or possibly due to urinary retention. Vascular calcifications are present. There is a small right inguinal hernia containing small bowel without apparent proximal obstruction. IMPRESSION: No acute fracture or dislocation of the right hip. Degenerative changes in the right hip. Small right hip joint effusion. Small right inguinal hernia containing small bowel without apparent proximal obstruction. Electronically Signed   By: Burman Nieves M.D.   On: 06/26/2018 23:56    Dg Abd Acute W/chest  Result Date: 07/02/2018 CLINICAL DATA:  Nausea vomiting for 2 days. Aortic valve replacement. Ex-smoker. EXAM: DG ABDOMEN ACUTE W/ 1V CHEST COMPARISON:  Chest radiograph 06/26/2018 FINDINGS: Frontal view of the chest demonstrates midline trachea. Patient rotated to the right. Moderate cardiomegaly. Prior median sternotomy. Atherosclerosis in the transverse aorta. Artifact projecting over the right lung base, without consolidation on upright abdominal image. No pleural effusion or pneumothorax. No congestive failure. Chronic interstitial thickening. Left base scarring. Osteopenia. Abdominal films demonstrate no free intraperitoneal air on upright positioning. Numerous leads and wires project over the upper abdomen and lower chest. No gaseous distention of bowel loops on supine imaging. Aortic atherosclerosis. IMPRESSION: No acute findings. Cardiomegaly without congestive failure. Aortic Atherosclerosis (ICD10-I70.0). Electronically Signed   By: Jeronimo Greaves M.D.   On: 07/02/2018 13:15   Dg Hip Unilat With Pelvis 2-3 Views Right  Result Date: 06/26/2018 CLINICAL DATA:  Right hip and groin pain after a fall EXAM: DG HIP (WITH OR WITHOUT PELVIS) 2-3V RIGHT COMPARISON:  None. FINDINGS: Limited evaluation of the right femoral  neck due to superimposition of trochanter. Possible step-off deformity at the right femoral head neck junction. Old appearing fracture deformity of the right superior and inferior pubic rami. IMPRESSION: 1. Limited evaluation of femoral neck due to positioning, questionable step-off deformity at the right femoral head neck junction, suggest CT to better evaluate. 2. Old appearing fractures of the right superior and inferior pubic rami. Electronically Signed   By: Jasmine Pang M.D.   On: 06/26/2018 20:13   Dg Hip Unilat With Pelvis Min 4 Views Right  Result Date: 07/16/2018 CLINICAL DATA:  84 year old currently in rehabilitation who fell approximately 3 weeks ago  while walking with her walker. Progressive low back pain, RIGHT hip pain and RIGHT UPPER leg pain. Subsequent encounter. EXAM: DG HIP (WITH OR WITHOUT PELVIS) 4+V RIGHT COMPARISON:  AP pelvis and RIGHT hip x-ray 06/26/2018. RIGHT hip CT 06/26/2018. FINDINGS: Since the prior examinations 06/26/2018, development of a new sclerotic line involving the subcapital location of the RIGHT femoral neck. No evidence of acute or subacute fractures elsewhere. Mild axial joint space narrowing as noted previously. Osseous demineralization. Included AP pelvis again demonstrates remote healed fractures involving the RIGHT SUPERIOR and INFERIOR pubic rami. Symmetric mild axial joint space narrowing in the contralateral LEFT hip. Sacroiliac joints and symphysis pubis intact without evidence of diastasis. IMPRESSION: 1. New sclerotic line involving the subcapital location of the RIGHT femoral neck, likely indicating a subacute, minimally impacted nondisplaced fracture which was inconspicuous on the prior imaging 06/26/2018. 2. Symmetric mild osteoarthritis in both hips. 3. Osseous demineralization. 4. Remote healed fractures involving the RIGHT SUPERIOR and INFERIOR pubic rami. Electronically Signed   By: Hulan Saas M.D.   On: 07/16/2018 12:23   Dg Femur, Min 2 Views Right  Result Date: 07/16/2018 CLINICAL DATA:  84 year old currently in rehabilitation who fell approximately 3 weeks ago while walking with her walker. Progressive low back pain, RIGHT hip pain and RIGHT UPPER leg pain. Subsequent encounter. EXAM: RIGHT FEMUR 2 VIEWS COMPARISON:  RIGHT hip x-rays obtained concurrently and 06/26/2018. No prior femur imaging. FINDINGS: Osseous demineralization. Sclerotic line involving the subcapital location of the RIGHT femoral neck as described in detail on the concurrent hip imaging. No fractures elsewhere involving the femur. Knee joint intact with moderate MEDIAL and LATERAL compartment joint space narrowing. IMPRESSION: 1.  Subacute subcapital RIGHT femoral neck fracture as detailed on the concurrent hip imaging. 2. No fractures elsewhere involving the femur. 3. Osteoarthritis involving the RIGHT knee. Electronically Signed   By: Hulan Saas M.D.   On: 07/16/2018 12:25    Time Spent in minutes  30   Susa Raring M.D on 07/18/2018 at 10:06 AM  To page go to [email or web address] - password [facility]

## 2018-07-18 NOTE — Patient Outreach (Signed)
[facility]) Care Management  07/18/2018  DELISHIA MCGAUGH [birth year] [id number]   Follow up call to patient's daughter, Lisbeth Ply, regarding social work referral for assistance with community resources upon discharge from [facility].   Ms Dhawan is currently inpatient at [facility] for surgery of hairline fracture that was located this past weekend. Per Ms. Montez Morita, the current discharge plan is for [name] to return to [facility] SNF for rehabilitation. BSW informed Ms. Montez Morita that patient will be referred to CSW to follow her while at [facility].  Order was placed and BSW is closing case.  Malachy Chamber, BSW Social Worker [phone]

## 2018-07-18 NOTE — Progress Notes (Signed)
Progress Note  Patient Name: [name] Date of Encounter: 07/18/2018  Primary Cardiologist: Dr Jens Som  Subjective   No chest pain or dyspnea.  Inpatient Medications    Scheduled Meds: . atorvastatin  40 mg Oral q1800  . carvedilol  6.25 mg Oral BID WC  . docusate sodium  100 mg Oral BID  . donepezil  10 mg Oral Daily  . feeding supplement  1 Container Oral Q24H  . pregabalin  75 mg Oral QODAY  . umeclidinium-vilanterol  1 puff Inhalation q morning - 10a   Continuous Infusions: . sodium chloride 75 mL/hr at 07/17/18 1800  . methocarbamol (ROBAXIN) IV     PRN Meds: albuterol, ALPRAZolam, bisacodyl, hydrALAZINE, HYDROcodone-acetaminophen, Melatonin, methocarbamol **OR** methocarbamol (ROBAXIN) IV, metoprolol tartrate, morphine injection, polyethylene glycol   Vital Signs    Vitals:   07/17/18 1709 07/17/18 1932 07/18/18 0614 07/18/18 0714  BP: (!) 117/38 (!) 132/55 (!) 157/62   Pulse: 67 73 67   Resp:      Temp:  98.5 F (36.9 C) 97.8 F (36.6 C)   TempSrc:  Oral Oral   SpO2:  98% 98% 98%  Weight:      Height:        Intake/Output Summary (Last 24 hours) at 07/18/2018 0854 Last data filed at 07/17/2018 1800 Gross per 24 hour  Intake 669.8 ml  Output -  Net 669.8 ml   Filed Weights   07/16/18 1531  Weight: 45.6 kg    Physical Exam   GEN: No acute distress.   Neck: No JVD Cardiac: RRR, 2/6 systolic and diastolic murmur Respiratory: Clear to auscultation bilaterally. GI: Soft, nontender, non-distended  MS: No edema Neuro:  Nonfocal  Psych: Normal affect   Labs    Chemistry Recent Labs  Lab 07/12/18 0753 07/16/18 1617 07/18/18 0315  NA 142 141 143  K 4.0 4.5 4.2  CL 103 103 110  CO2 32 32 28  GLUCOSE 83 106* 98  BUN 30* 32* 32*  CREATININE 1.09* 1.34* 1.11*  CALCIUM 9.2 9.1 8.3*  GFRNONAA 44* 34* 43*  GFRAA 51* 39* 50*  ANIONGAP 7 6 5      Hematology Recent Labs  Lab 07/12/18 0753 07/16/18 1617 07/18/18 0315  WBC 7.2 6.9 6.4   RBC 2.90*  2.90* 2.93* 2.50*  HGB 8.6* 8.7* 7.4*  HCT 28.3* 28.5* 24.4*  MCV 97.6 97.3 97.6  MCH 29.7 29.7 29.6  MCHC 30.4 30.5 30.3  RDW 13.8 14.1 13.6  PLT 322 283 235     Radiology    Dg Chest 1 View  Result Date: 07/16/2018 CLINICAL DATA:  84 year old female with history of right-sided hip fracture. Preoperative evaluation. EXAM: CHEST  1 VIEW COMPARISON:  Chest x-ray 07/02/2018. FINDINGS: Study is exceedingly limited by marked patient rotation to the right. With these limitations in mind, areas no definite acute consolidative airspace disease. No definite pleural effusions. No evidence of pulmonary edema. Heart size appears enlarged. Upper mediastinal contours are grossly distorted by patient's rotation. Aortic status post median sternotomy for CABG and aortic valve replacement. IMPRESSION: 1. Limited examination demonstrating no definite radiographic evidence of acute cardiopulmonary disease. 2. Aortic atherosclerosis. 3. Cardiomegaly. Electronically Signed   By: Trudie Reed M.D.   On: 07/16/2018 18:20   Dg Lumbar Spine Complete  Result Date: 07/16/2018 CLINICAL DATA:  84 year old currently in rehabilitation who fell approximately 3 weeks ago while walking with her walker. Progressive low back pain, RIGHT hip pain and RIGHT UPPER leg  pain. Subsequent encounter. EXAM: LUMBAR SPINE - COMPLETE 4+ VIEW COMPARISON:  None. FINDINGS: Five non-rib-bearing lumbar vertebrae with anatomic alignment. No fractures. Osseous demineralization. Severe disc space narrowing at L3-4 and L4-5. Remaining disc spaces well-preserved. Severe LEFT and mild RIGHT facet degenerative changes at L4-5 and L5-S1. No pars defects. Aortoiliac atherosclerosis with evidence of a distal abdominal aortic aneurysm measuring up to approximately 3.5 cm diameter. IMPRESSION: 1. No acute osseous abnormality. 2. Osseous demineralization. 3. Severe degenerative disc disease at L3-4 and L4-5. 4. Severe LEFT and mild RIGHT facet  degenerative changes at L4-5 and L5-S1. 5.  Aortic Atherosclerosis (ICD10-I70.0). 6. Aortic aneurysm NOS, measuring up to approximately 3.5 cm diameter. (ICD10-I71.9). Recommend followup by ultrasound in 3 years. This recommendation follows ACR consensus guidelines: White Paper of the ACR Incidental Findings Committee II on Vascular Findings. J Am Coll Radiol 2013; 10:789-794. Electronically Signed   By: Hulan Saas M.D.   On: 07/16/2018 12:30   Ct Hip Right Wo Contrast  Result Date: 07/16/2018 CLINICAL DATA:  Right hip fracture. EXAM: CT OF THE RIGHT HIP WITHOUT CONTRAST TECHNIQUE: Multidetector CT imaging of the right hip was performed according to the standard protocol. Multiplanar CT image reconstructions were also generated. COMPARISON:  Right hip x-rays from same day. CT right hip dated June 26, 2018. FINDINGS: Bones/Joint/Cartilage Subacute common nondisplaced fracture of the right subcapital femoral neck with mild sclerosis of the fracture line. No dislocation. Normal alignment. Small joint effusion. Severe osteopenia. Old right superior inferior pubic rami fractures. Mild right hip osteoarthritis. The Ligaments Ligaments are suboptimally evaluated by CT. Muscles and Tendons Mild diffuse muscle atrophy. Moderate atrophy of the gluteus minimus muscle. Soft tissue No fluid collection or hematoma.  No soft tissue mass. The visualized internal pelvic contents demonstrate small right inguinal hernia now containing only fat. Sigmoid diverticulosis. Vascular calcifications. IMPRESSION: 1. Subacute, minimally impacted, subcapital right femoral neck fracture. Electronically Signed   By: Obie Dredge M.D.   On: 07/16/2018 17:54   Dg Hip Unilat With Pelvis Min 4 Views Right  Result Date: 07/16/2018 CLINICAL DATA:  84 year old currently in rehabilitation who fell approximately 3 weeks ago while walking with her walker. Progressive low back pain, RIGHT hip pain and RIGHT UPPER leg pain. Subsequent  encounter. EXAM: DG HIP (WITH OR WITHOUT PELVIS) 4+V RIGHT COMPARISON:  AP pelvis and RIGHT hip x-ray 06/26/2018. RIGHT hip CT 06/26/2018. FINDINGS: Since the prior examinations 06/26/2018, development of a new sclerotic line involving the subcapital location of the RIGHT femoral neck. No evidence of acute or subacute fractures elsewhere. Mild axial joint space narrowing as noted previously. Osseous demineralization. Included AP pelvis again demonstrates remote healed fractures involving the RIGHT SUPERIOR and INFERIOR pubic rami. Symmetric mild axial joint space narrowing in the contralateral LEFT hip. Sacroiliac joints and symphysis pubis intact without evidence of diastasis. IMPRESSION: 1. New sclerotic line involving the subcapital location of the RIGHT femoral neck, likely indicating a subacute, minimally impacted nondisplaced fracture which was inconspicuous on the prior imaging 06/26/2018. 2. Symmetric mild osteoarthritis in both hips. 3. Osseous demineralization. 4. Remote healed fractures involving the RIGHT SUPERIOR and INFERIOR pubic rami. Electronically Signed   By: Hulan Saas M.D.   On: 07/16/2018 12:23   Dg Femur, Min 2 Views Right  Result Date: 07/16/2018 CLINICAL DATA:  84 year old currently in rehabilitation who fell approximately 3 weeks ago while walking with her walker. Progressive low back pain, RIGHT hip pain and RIGHT UPPER leg pain. Subsequent encounter. EXAM: RIGHT FEMUR 2  VIEWS COMPARISON:  RIGHT hip x-rays obtained concurrently and 06/26/2018. No prior femur imaging. FINDINGS: Osseous demineralization. Sclerotic line involving the subcapital location of the RIGHT femoral neck as described in detail on the concurrent hip imaging. No fractures elsewhere involving the femur. Knee joint intact with moderate MEDIAL and LATERAL compartment joint space narrowing. IMPRESSION: 1. Subacute subcapital RIGHT femoral neck fracture as detailed on the concurrent hip imaging. 2. No fractures  elsewhere involving the femur. 3. Osteoarthritis involving the RIGHT knee. Electronically Signed   By: Hulan Saas M.D.   On: 07/16/2018 12:25    Patient Profile     84 y.o. female 84 year old female with past medical history of coronary artery disease status post coronary artery bypass graft, prior mechanical aortic valve replacement, chronic combined systolic/diastolic congestive heart failure, hypertension, hyperlipidemia, COPD for preoperative evaluation prior to repair of hip fracture.  Assessment & Plan    1 preoperative evaluation prior to repair of hip fracture-patient has good functional capacity prior to hip fracture.  There is no dyspnea or chest pain.  She may proceed without further ischemia evaluation.  She is at increased risk given her cardiac history and age.  2 chronic combined systolic/diastolic congestive heart failure-I have personally reviewed the patient's prior echocardiogram from March.  Her ejection fraction is approximately 35%.  Her diuretics are on hold.  I will decrease IV fluids to [facility].  Follow closely postoperatively for signs of volume excess.  3 cardiomyopathy-continue carvedilol.  Would resume ACE inhibitor following surgery.  4 prior aortic valve replacement-Coumadin is on hold.  Would resume heparin postoperatively when hemostasis achieved.  Resume Coumadin at that time as well.  5 status post hip fracture-management per orthopedics.  6 hypertension-follow blood pressure and adjust regimen as needed.  Resume ACE inhibitor following surgery.  7 coronary artery disease-continue statin.  For questions or updates, please contact [facility] Please consult [email or web address] for contact info under Cardiology/STEMI.      Signed, [name], MD  07/18/2018, 8:54 AM

## 2018-07-18 NOTE — Progress Notes (Signed)
ANTICOAGULATION CONSULT NOTE - Initial Consult  Pharmacy Consult for Heparin Indication: AVR  Allergies  Allergen Reactions  . Codeine Nausea And Vomiting  . Penicillins Other (See Comments)    Unknown reaction - listed on MAR Has patient had a PCN reaction causing immediate rash, facial/tongue/throat swelling, SOB or lightheadedness with hypotension: Unknown Has patient had a PCN reaction causing severe rash involving mucus membranes or skin necrosis: Unknown Has patient had a PCN reaction that required hospitalization: Unknown Has patient had a PCN reaction occurring within the last 10 years: Unknown If all of the above answers are "NO", then may proceed with Cephalosporin use.    Patient Measurements: Height: 5\' 2"  (157.5 cm) Weight: 100 lb 9.6 oz (45.6 kg) IBW/kg (Calculated) : 50.1 Heparin Dosing Weight:  45.6 kg  Vital Signs: Temp: 97.3 F (36.3 C) (09/03 1245) Temp Source: Oral (09/03 1055) BP: 180/114 (09/03 1415) Pulse Rate: 53 (09/03 1415)  Labs: Recent Labs    07/16/18 0420 07/16/18 1617 07/17/18 0353 07/18/18 0315  HGB  --  8.7*  --  7.4*  HCT  --  28.5*  --  24.4*  PLT  --  283  --  235  LABPROT 27.8*  --  23.4* 19.4*  INR 2.63  --  2.10 1.66  CREATININE  --  1.34*  --  1.11*    Estimated Creatinine Clearance: 24.7 mL/min (A) (by C-G formula based on SCr of 1.11 mg/dL (H)).   Medical History: Past Medical History:  Diagnosis Date  . Carotid artery stenosis    Without infarction  . Chronic combined systolic (congestive) and diastolic (congestive) heart failure (HCC)   . COPD (chronic obstructive pulmonary disease) (HCC)   . Coronary artery disease   . Heart valve replaced by other means   . Hypercholesterolemia    Pure  . Hypertension    Unspecified  . LBBB (left bundle branch block)   . Macular degeneration (senile) of retina, unspecified   . Postsurgical aortocoronary bypass status   . Stroke (HCC)   . Transient global amnesia   .  Unspecified hereditary and idiopathic peripheral neuropathy   . Unspecified vitamin D deficiency     Assessment: Anticoag: Coumadin PTA for AVR 1997 >> Heparin d/t hip fx requiring surgery, INR down 1.66 - AET 1246 for R hip fx. Ok to start IV heparin 2 hrs post-op per Dr. Thedore Mins. *home dose: 2.5mg  daily except 5mg  on Sat  Goal of Therapy:  Heparin level 0.3-0.7 units/ml Monitor platelets by anticoagulation protocol: Yes   Plan:  Start IV heparin at 600 units/hr Check heparin level in 6 hrs Daily HL and CBC  Tequila Rottmann S. Merilynn Finland, PharmD, BCPS Clinical Staff Pharmacist Pager [phone]  Misty Stanley Stillinger 07/18/2018,2:26 PM

## 2018-07-18 NOTE — Transfer of Care (Signed)
Immediate Anesthesia Transfer of Care Note  Patient: [name]  Procedure(s) Performed: RIGHT CANNULATED HIP PINNING (Right Hip)  Patient Location: PACU  Anesthesia Type:General  Level of Consciousness: awake and alert   Airway & Oxygen Therapy: Patient Spontanous Breathing and Patient connected to nasal cannula oxygen  Post-op Assessment: Report given to RN and Post -op Vital signs reviewed and stable  Post vital signs: Reviewed and stable  Last Vitals:  Vitals Value Taken Time  BP 168/51 07/18/2018 12:43 PM  Temp    Pulse 68 07/18/2018 12:49 PM  Resp 10 07/18/2018 12:49 PM  SpO2 93 % 07/18/2018 12:49 PM  Vitals shown include unvalidated device data.  Last Pain:  Vitals:   07/18/18 1055  TempSrc: Oral         Complications: No apparent anesthesia complications

## 2018-07-18 NOTE — Anesthesia Procedure Notes (Addendum)
Procedure Name: Intubation Date/Time: 07/18/2018 11:22 AM Performed by: Gwyndolyn Saxon, CRNA Pre-anesthesia Checklist: Patient identified Patient Re-evaluated:Patient Re-evaluated prior to induction Oxygen Delivery Method: Circle system utilized Preoxygenation: Pre-oxygenation with 100% oxygen Induction Type: IV induction Ventilation: Mask ventilation without difficulty Laryngoscope Size: Mac and 3 Grade View: Grade I Tube type: Oral Endobronchial tube: Right Tube size: 6.5 mm Number of attempts: 1 Airway Equipment and Method: Stylet Placement Confirmation: ETT inserted through vocal cords under direct vision,  positive ETCO2,  CO2 detector and breath sounds checked- equal and bilateral Secured at: 23 cm Tube secured with: Tape Dental Injury: Teeth and Oropharynx as per pre-operative assessment

## 2018-07-18 NOTE — Patient Outreach (Signed)
[facility]) Care Management  07/18/2018  SAACHI ZALE [birth year] [id number]   Onsite visit to [facility] today.  Met with Marianna Fuss, SW She reports patient did not discharge as scheduled. Patient had a follow up xray and they found a hip fracture and patient was to have a procedure today. She anticipates patient will return to facility for more therapy after her surgery.  Plan: This RNCM will update the [facility] CM care team of patient disposition. [name]. [name], RN, BSN, [place] [phone]) Business Cell  [phone]) Toll Free Office

## 2018-07-19 ENCOUNTER — Encounter (HOSPITAL_COMMUNITY): Payer: Self-pay | Admitting: Orthopedic Surgery

## 2018-07-19 LAB — BPAM RBC
BLOOD PRODUCT EXPIRATION DATE: 201909262359
Blood Product Expiration Date: 201909262359
ISSUE DATE / TIME: 201909030958
ISSUE DATE / TIME: 201909030958
UNIT TYPE AND RH: 9500
Unit Type and Rh: 9500

## 2018-07-19 LAB — CBC
HEMATOCRIT: 31.8 % — AB (ref 36.0–46.0)
HEMOGLOBIN: 10 g/dL — AB (ref 12.0–15.0)
MCH: 30.2 pg (ref 26.0–34.0)
MCHC: 31.4 g/dL (ref 30.0–36.0)
MCV: 96.1 fL (ref 78.0–100.0)
Platelets: 200 10*3/uL (ref 150–400)
RBC: 3.31 MIL/uL — ABNORMAL LOW (ref 3.87–5.11)
RDW: 14.7 % (ref 11.5–15.5)
WBC: 10 10*3/uL (ref 4.0–10.5)

## 2018-07-19 LAB — BASIC METABOLIC PANEL
ANION GAP: 6 (ref 5–15)
BUN: 21 mg/dL (ref 8–23)
CHLORIDE: 107 mmol/L (ref 98–111)
CO2: 25 mmol/L (ref 22–32)
Calcium: 9.2 mg/dL (ref 8.9–10.3)
Creatinine, Ser: 1.04 mg/dL — ABNORMAL HIGH (ref 0.44–1.00)
GFR calc non Af Amer: 46 mL/min — ABNORMAL LOW (ref >60)
GFR, EST AFRICAN AMERICAN: 54 mL/min — AB (ref >60)
GLUCOSE: 142 mg/dL — AB (ref 70–99)
POTASSIUM: 4 mmol/L (ref 3.5–5.1)
Sodium: 138 mmol/L (ref 135–145)

## 2018-07-19 LAB — TYPE AND SCREEN
ABO/RH(D): O NEG
Antibody Screen: NEGATIVE
UNIT DIVISION: 0
Unit division: 0

## 2018-07-19 LAB — HEPARIN LEVEL (UNFRACTIONATED)
HEPARIN UNFRACTIONATED: 0.2 [IU]/mL — AB (ref 0.30–0.70)
HEPARIN UNFRACTIONATED: 0.35 [IU]/mL (ref 0.30–0.70)

## 2018-07-19 LAB — PROTIME-INR
INR: 1.42
PROTHROMBIN TIME: 17.2 s — AB (ref 11.4–15.2)

## 2018-07-19 MED ORDER — BENAZEPRIL HCL 20 MG PO TABS
40.0000 mg | ORAL_TABLET | Freq: Every day | ORAL | Status: DC
  Administered 2018-07-19 – 2018-07-21 (×3): 40 mg via ORAL
  Filled 2018-07-19 (×4): qty 2

## 2018-07-19 MED ORDER — WARFARIN - PHARMACIST DOSING INPATIENT: Freq: Every day | Status: DC

## 2018-07-19 MED ORDER — WARFARIN SODIUM 4 MG PO TABS
4.0000 mg | ORAL_TABLET | Freq: Once | ORAL | Status: AC
  Filled 2018-07-19: qty 1

## 2018-07-19 MED ORDER — FUROSEMIDE 40 MG PO TABS
40.0000 mg | ORAL_TABLET | Freq: Every day | ORAL | Status: DC
  Administered 2018-07-19 – 2018-07-21 (×3): 40 mg via ORAL
  Filled 2018-07-19 (×3): qty 1

## 2018-07-19 NOTE — Consult Note (Signed)
   [facility] CM Inpatient Consult   07/19/2018  KERYL BREDESEN [birth year] [id number]  Aware of patient's admission.  Patient is currently active with [facility] Care Management for chronic disease management services.  Patient admitted from [facility]. Will make Inpatient Case Manager aware that [facility] Care Management is following for progress and disposition needs. Of note, [facility] Care Management services does not replace or interfere with any services that are needed or arranged by inpatient case management or social work.  For additional questions or referrals please contact:   Charlesetta Shanks, RN BSN CCM [facility]  [phone] business mobile phone Toll free office [phone]

## 2018-07-19 NOTE — Progress Notes (Signed)
ANTICOAGULATION CONSULT NOTE  Pharmacy Consult:  Heparin / Coumadin Indication:  History of AVR   Allergies  Allergen Reactions  . Codeine Nausea And Vomiting  . Penicillins Other (See Comments)    Unknown reaction - listed on MAR Has patient had a PCN reaction causing immediate rash, facial/tongue/throat swelling, SOB or lightheadedness with hypotension: Unknown Has patient had a PCN reaction causing severe rash involving mucus membranes or skin necrosis: Unknown Has patient had a PCN reaction that required hospitalization: Unknown Has patient had a PCN reaction occurring within the last 10 years: Unknown If all of the above answers are "NO", then may proceed with Cephalosporin use.    Patient Measurements: Height: 5\' 2"  (157.5 cm) Weight: 100 lb 9.6 oz (45.6 kg) IBW/kg (Calculated) : 50.1 Heparin Dosing Weight: 45 kg  Vital Signs: Temp: 98.5 F (36.9 C) (09/04 1942) Temp Source: Oral (09/04 1942) BP: 146/49 (09/04 1942) Pulse Rate: 74 (09/04 1942)  Labs: Recent Labs    07/17/18 0353 07/18/18 0315 07/18/18 2209 07/19/18 0820 07/19/18 1826  HGB  --  7.4*  --  10.0*  --   HCT  --  24.4*  --  31.8*  --   PLT  --  235  --  200  --   LABPROT 23.4* 19.4*  --  17.2*  --   INR 2.10 1.66  --  1.42  --   HEPARINUNFRC  --   --  <0.10* 0.20* 0.35  CREATININE  --  1.11*  --  1.04*  --     Estimated Creatinine Clearance: 26.4 mL/min (A) (by C-G formula based on SCr of 1.04 mg/dL (H)).    Assessment: 38 YOF with history of AVR on Coumadin PTA.  Patient presented with hip pain requiring surgery on 07/18/18.  Heparin started post-op and Coumadin to be resumed today.  Heparin level is now at goal after heparin rate adjustments this morning. No bleeding reported.   Home Coumadin dose: 2.5mg  daily except 5mg  on Sat   Goal of Therapy:  Heparin level 0.3-0.7 units/ml  INR 2 - 3 Monitor platelets by anticoagulation protocol: Yes    Plan:  Increase heparin gtt to 900  units/hr Daily heparin level, PT / INR and CBC  Sheppard Coil PharmD., BCPS Clinical Pharmacist 07/19/2018 8:47 PM

## 2018-07-19 NOTE — Discharge Instructions (Addendum)
[facility] Discharge Instructions   General Discharge Instructions  WEIGHT BEARING STATUS: weightbearing as tolerated R leg  RANGE OF MOTION/ACTIVITY: range of motion as tolerated R hip and knee   Wound Care: daily wound care starting on 07/23/2018. See below  Discharge Wound Care Instructions  Do NOT apply any ointments, solutions or lotions to pin sites or surgical wounds.  These prevent needed drainage and even though solutions like hydrogen peroxide kill bacteria, they also damage cells lining the pin sites that help fight infection.  Applying lotions or ointments can keep the wounds moist and can cause them to breakdown and open up as well. This can increase the risk for infection. When in doubt call the office.  Surgical incisions should be dressed daily.  If any drainage is noted, use one layer of adaptic, then gauze, Kerlix, and an ace wrap.  Once the incision is completely dry and without drainage, it may be left open to air out.  Showering may begin 36-48 hours later.  Cleaning gently with soap and water.  Traumatic wounds should be dressed daily as well.    One layer of adaptic, gauze, Kerlix, then ace wrap.  The adaptic can be discontinued once the draining has ceased    If you have a wet to dry dressing: wet the gauze with saline the squeeze as much saline out so the gauze is moist (not soaking wet), place moistened gauze over wound, then place a dry gauze over the moist one, followed by Kerlix wrap, then ace wrap.   DVT/PE prophylaxis: coumadin   Diet: as you were eating previously.  Can use over the counter stool softeners and bowel preparations, such as Miralax, to help with bowel movements.  Narcotics can be constipating.  Be sure to drink plenty of fluids  PAIN MEDICATION USE AND EXPECTATIONS  You have likely been given narcotic medications to help control your pain.  After a traumatic event that results in an fracture (broken bone) with or without  surgery, it is ok to use narcotic pain medications to help control one's pain.  We understand that everyone responds to pain differently and each individual patient will be evaluated on a regular basis for the continued need for narcotic medications. Ideally, narcotic medication use should last no more than 6-8 weeks (coinciding with fracture healing).   As a patient it is your responsibility as well to monitor narcotic medication use and report the amount and frequency you use these medications when you come to your office visit.   We would also advise that if you are using narcotic medications, you should take a dose prior to therapy to maximize you participation.  IF YOU ARE ON NARCOTIC MEDICATIONS IT IS NOT PERMISSIBLE TO OPERATE A MOTOR VEHICLE (MOTORCYCLE/CAR/TRUCK/MOPED) OR HEAVY MACHINERY DO NOT MIX NARCOTICS WITH OTHER CNS (CENTRAL NERVOUS SYSTEM) DEPRESSANTS SUCH AS ALCOHOL   STOP SMOKING OR USING NICOTINE PRODUCTS!!!!  As discussed nicotine severely impairs your body's ability to heal surgical and traumatic wounds but also impairs bone healing.  Wounds and bone heal by forming microscopic blood vessels (angiogenesis) and nicotine is a vasoconstrictor (essentially, shrinks blood vessels).  Therefore, if vasoconstriction occurs to these microscopic blood vessels they essentially disappear and are unable to deliver necessary nutrients to the healing tissue.  This is one modifiable factor that you can do to dramatically increase your chances of healing your injury.    (This means no smoking, no nicotine gum, patches, etc)  DO NOT USE NONSTEROIDAL  ANTI-INFLAMMATORY DRUGS (NSAID'S)  Using products such as Advil (ibuprofen), Aleve (naproxen), Motrin (ibuprofen) for additional pain control during fracture healing can delay and/or prevent the healing response.  If you would like to take over the counter (OTC) medication, Tylenol (acetaminophen) is ok.  However, some narcotic medications that are given  for pain control contain acetaminophen as well. Therefore, you should not exceed more than 4000 mg of tylenol in a day if you do not have liver disease.  Also note that there are may OTC medicines, such as cold medicines and allergy medicines that my contain tylenol as well.  If you have any questions about medications and/or interactions please ask your doctor/PA or your pharmacist.      ICE AND ELEVATE INJURED/OPERATIVE EXTREMITY  Using ice and elevating the injured extremity above your heart can help with swelling and pain control.  Icing in a pulsatile fashion, such as 20 minutes on and 20 minutes off, can be followed.    Do not place ice directly on skin. Make sure there is a barrier between to skin and the ice pack.    Using frozen items such as frozen peas works well as the conform nicely to the are that needs to be iced.  USE AN ACE WRAP OR TED HOSE FOR SWELLING CONTROL  In addition to icing and elevation, Ace wraps or TED hose are used to help limit and resolve swelling.  It is recommended to use Ace wraps or TED hose until you are informed to stop.    When using Ace Wraps start the wrapping distally (farthest away from the body) and wrap proximally (closer to the body)   Example: If you had surgery on your leg or thing and you do not have a splint on, start the ace wrap at the toes and work your way up to the thigh        If you had surgery on your upper extremity and do not have a splint on, start the ace wrap at your fingers and work your way up to the upper arm  IF YOU ARE IN A SPLINT OR CAST DO NOT REMOVE IT FOR ANY REASON   If your splint gets wet for any reason please contact the office immediately. You may shower in your splint or cast as long as you keep it dry.  This can be done by wrapping in a cast cover or garbage back (or similar)  Do Not stick any thing down your splint or cast such as pencils, money, or hangers to try and scratch yourself with.  If you feel itchy take  benadryl as prescribed on the bottle for itching  IF YOU ARE IN A CAM BOOT (BLACK BOOT)  You may remove boot periodically. Perform daily dressing changes as noted below.  Wash the liner of the boot regularly and wear a sock when wearing the boot. It is recommended that you sleep in the boot until told otherwise  CALL THE OFFICE WITH ANY QUESTIONS OR CONCERNS: [phone]     Information on my medicine - Coumadin   (Warfarin)  This medication education was reviewed with me or my healthcare representative as part of my discharge preparation.  The pharmacist that spoke with me during my hospital stay was:  Lennon Alstrom, [facility]  Why was Coumadin prescribed for you? Coumadin was prescribed for you because you have a blood clot or a medical condition that can cause an increased risk of forming blood clots. Blood clots  can cause serious health problems by blocking the flow of blood to the heart, lung, or brain. Coumadin can prevent harmful blood clots from forming. As a reminder your indication for Coumadin is:   Blood Clot Prevention After Heart Valve Surgery  What test will check on my response to Coumadin? While on Coumadin (warfarin) you will need to have an INR test regularly to ensure that your dose is keeping you in the desired range. The INR (international normalized ratio) number is calculated from the result of the laboratory test called prothrombin time (PT).  If an INR APPOINTMENT HAS NOT ALREADY BEEN MADE FOR YOU please schedule an appointment to have this lab work done by your health care provider within 7 days. Your INR goal is usually a number between:  2 to 3 or your provider may give you a more narrow range like 2-2.5.  Ask your health care provider during an office visit what your goal INR is.  What  do you need to  know  About  COUMADIN? Take Coumadin (warfarin) exactly as prescribed by your healthcare provider about the same time each day.  DO NOT stop taking without talking  to the doctor who prescribed the medication.  Stopping without other blood clot prevention medication to take the place of Coumadin may increase your risk of developing a new clot or stroke.  Get refills before you run out.  What do you do if you miss a dose? If you miss a dose, take it as soon as you remember on the same day then continue your regularly scheduled regimen the next day.  Do not take two doses of Coumadin at the same time.  Important Safety Information A possible side effect of Coumadin (Warfarin) is an increased risk of bleeding. You should call your healthcare provider right away if you experience any of the following: ? Bleeding from an injury or your nose that does not stop. ? Unusual colored urine (red or dark brown) or unusual colored stools (red or black). ? Unusual bruising for unknown reasons. ? A serious fall or if you hit your head (even if there is no bleeding).  Some foods or medicines interact with Coumadin (warfarin) and might alter your response to warfarin. To help avoid this: ? Eat a balanced diet, maintaining a consistent amount of Vitamin K. ? Notify your provider about major diet changes you plan to make. ? Avoid alcohol or limit your intake to 1 drink for women and 2 drinks for men per day. (1 drink is 5 oz. wine, 12 oz. beer, or 1.5 oz. liquor.)  Make sure that ANY health care provider who prescribes medication for you knows that you are taking Coumadin (warfarin).  Also make sure the healthcare provider who is monitoring your Coumadin knows when you have started a new medication including herbals and non-prescription products.  Coumadin (Warfarin)  Major Drug Interactions  Increased Warfarin Effect Decreased Warfarin Effect  Alcohol (large quantities) Antibiotics (esp. Septra/Bactrim, Flagyl, Cipro) Amiodarone (Cordarone) Aspirin (ASA) Cimetidine (Tagamet) Megestrol (Megace) NSAIDs (ibuprofen, naproxen, etc.) Piroxicam (Feldene) Propafenone  (Rythmol SR) Propranolol (Inderal) Isoniazid (INH) Posaconazole (Noxafil) Barbiturates (Phenobarbital) Carbamazepine (Tegretol) Chlordiazepoxide (Librium) Cholestyramine (Questran) Griseofulvin Oral Contraceptives Rifampin Sucralfate (Carafate) Vitamin K   Coumadin (Warfarin) Major Herbal Interactions  Increased Warfarin Effect Decreased Warfarin Effect  Garlic Ginseng Ginkgo biloba Coenzyme Q10 Green tea St. Johns wort    Coumadin (Warfarin) FOOD Interactions  Eat a consistent number of servings per week of foods HIGH in Vitamin  K (1 serving =  cup)  Collards (cooked, or boiled & drained) Kale (cooked, or boiled & drained) Mustard greens (cooked, or boiled & drained) Parsley *serving size only =  cup Spinach (cooked, or boiled & drained) Swiss chard (cooked, or boiled & drained) Turnip greens (cooked, or boiled & drained)  Eat a consistent number of servings per week of foods MEDIUM-HIGH in Vitamin K (1 serving = 1 cup)  Asparagus (cooked, or boiled & drained) Broccoli (cooked, boiled & drained, or raw & chopped) Brussel sprouts (cooked, or boiled & drained) *serving size only =  cup Lettuce, raw (green leaf, endive, romaine) Spinach, raw Turnip greens, raw & chopped   These websites have more information on Coumadin (warfarin):  http://www.king-russell.com/; https://www.hines.net/;

## 2018-07-19 NOTE — Plan of Care (Signed)
  Problem: Education: Goal: Knowledge of General Education information will improve Description Including pain rating scale, medication(s)/side effects and non-pharmacologic comfort measures Outcome: Progressing Note:  POC and pain management reviewed with pt. and daughter.

## 2018-07-19 NOTE — Progress Notes (Signed)
PROGRESS NOTE  IRIDIAN READER WUJ:[id number] DOB: [birth year] DOA: 07/16/2018 PCP: Kari Baars, MD  HPI/Recap of past 24 hours:  She denies pain, no sob, no fever  daughter at bedside  Assessment/Plan: Principal Problem:   Hip fracture (HCC) Active Problems:   Hyperlipidemia   Essential hypertension   CAD, ARTERY BYPASS GRAFT   S/P AVR (aortic valve replacement)   Dementia   Anemia   Chronic respiratory failure with hypoxia, on home O2 therapy (HCC)   Protein-calorie malnutrition, severe   R subacute non-displaced hip fracture  -  with history of one mechanical fall, however says there was some pain even prior to the fall,  S/p RIGHT CANNULATED HIP PINNING  On 9/3 with cardiology for preop eval Ortho oked to resume coumadin postop weightbearing status, wound care, pain management per ortho Will require SNF post postop, preferably in [place] where she lives.  Post op anemia, with h/o Anemia of chronic disease S/p prbcx2 units on 9/3 hgb with appropriate improvement post transfusion Repeat cbc in am  CAD status post mechanical aortic valve placement. -  chronic combined systolic/diastolic congestive heart failure She is on chronic Coumadin at home, coumadin held, she is on heparin bridging for surgery Now ortho oked to resume coumadin   currently stable chest pain-free, EKG nonacute,    Continue beta-blocker and statin for secondary prevention.   Home meds lasix/acei per cardiology  CKD 3 with mild ARF on presentation.   ACE inhibitor held initially, resumed on 9/4,  ARF resolved after gentle hydration creatinine back to baseline.  HTN  - Continue Coreg,  ACE  Inhibitor held initially  due to ARF, resumed on 9/4 PRN IV hydralazine and Lopressor as needed.  HLD  - Continue Lipitor  Chronic respiratory failure from COPD, on home O2 4liters at baseline - this is due to underlying COPD, currently compensated, continue home nebulized medications and as  needed nebulizer, continue oxygen.  No wheezing on exam shortness of breath, no acute issue.   Dementia - Continue Aricept is at risk for delirium.  Minimize narcotics and benzodiazepines.    Code Status: DNR  Family Communication: patient and daughters at bedside  Disposition Plan: snf in 24-48hrs   Consultants:  Ortho  cardiology  Procedures:  S/p RIGHT CANNULATED HIP PINNING  On 9/3  Antibiotics:  Perioperative abx   Objective: BP (!) 138/48 (BP Location: Right Arm)   Pulse 63   Temp 97.7 F (36.5 C)   Resp 16   Ht 5\' 2"  (1.575 m)   Wt 45.6 kg   SpO2 97%   BMI 18.40 kg/m   Intake/Output Summary (Last 24 hours) at 07/19/2018 0901 Last data filed at 07/19/2018 0641 Gross per 24 hour  Intake 1127 ml  Output 350 ml  Net 777 ml   Filed Weights   07/16/18 1531  Weight: 45.6 kg    Exam: Patient is examined daily including today on 07/19/2018, exams remain the same as of yesterday except that has changed    General:  NAD  Cardiovascular: RRR  Respiratory: Mild basilar crackles, left > right,  Abdomen: Soft/ND/NT, positive BS  Musculoskeletal: No Edema  Neuro: alert, oriented to person with baseline dementia  Data Reviewed: Basic Metabolic Panel: Recent Labs  Lab 07/16/18 1617 07/18/18 0315  NA 141 143  K 4.5 4.2  CL 103 110  CO2 32 28  GLUCOSE 106* 98  BUN 32* 32*  CREATININE 1.34* 1.11*  CALCIUM 9.1 8.3*   Liver  Function Tests: No results for input(s): AST, ALT, ALKPHOS, BILITOT, PROT, ALBUMIN in the last 168 hours. No results for input(s): LIPASE, AMYLASE in the last 168 hours. No results for input(s): AMMONIA in the last 168 hours. CBC: Recent Labs  Lab 07/16/18 1617 07/18/18 0315  WBC 6.9 6.4  NEUTROABS 4.6  --   HGB 8.7* 7.4*  HCT 28.5* 24.4*  MCV 97.3 97.6  PLT 283 235   Cardiac Enzymes:   No results for input(s): CKTOTAL, CKMB, CKMBINDEX, TROPONINI in the last 168 hours. BNP (last 3 results) Recent Labs     06/26/18 1935  BNP 1,329.0*    ProBNP (last 3 results) No results for input(s): PROBNP in the last 8760 hours.  CBG: No results for input(s): GLUCAP in the last 168 hours.  Recent Results (from the past 240 hour(s))  Surgical pcr screen     Status: None   Collection Time: 07/17/18  3:57 AM  Result Value Ref Range Status   MRSA, PCR NEGATIVE NEGATIVE Final   Staphylococcus aureus NEGATIVE NEGATIVE Final    Comment: (NOTE) The Xpert SA Assay (FDA approved for NASAL specimens in patients 6 years of age and older), is one component of a comprehensive surveillance program. It is not intended to diagnose infection nor to guide or monitor treatment. Performed at [facility], [address] [place], [place] [address]      Studies: Dg C-arm 1-60 Min  Result Date: 07/18/2018 CLINICAL DATA:  Right hip surgery. EXAM: DG C-ARM 61-120 MIN; OPERATIVE RIGHT HIP WITH PELVIS COMPARISON:  07/16/2018. FINDINGS: Surgical screws in the right hip. Hardware intact. Anatomic alignment. IMPRESSION: Postsurgical changes right hip with anatomic alignment. Electronically Signed   By: Maisie Fus  Register   On: 07/18/2018 12:41   Dg Hip Operative Unilat With Pelvis Right  Result Date: 07/18/2018 CLINICAL DATA:  Right hip surgery. EXAM: DG C-ARM 61-120 MIN; OPERATIVE RIGHT HIP WITH PELVIS COMPARISON:  07/16/2018. FINDINGS: Surgical screws in the right hip. Hardware intact. Anatomic alignment. IMPRESSION: Postsurgical changes right hip with anatomic alignment. Electronically Signed   By: Maisie Fus  Register   On: 07/18/2018 12:41   Dg Hip Unilat With Pelvis 2-3 Views Right  Result Date: 07/18/2018 CLINICAL DATA:  Screw fixation for fracture EXAM: DG HIP (WITH OR WITHOUT PELVIS) 2-3V RIGHT COMPARISON:  Pelvis and right hip July 16, 2018; intraoperative images right hip July 18, 2018 FINDINGS: Frontal pelvis as well as frontal and lateral right hip images were obtained. There are 3 screws transfixing a  subcapital femoral neck fracture on the right with screw tips in the proximal femoral head on the right. Alignment at fracture site is essentially anatomic. There is evidence of old trauma involving the medial right superior pubic ramus and medial right ischium with remodeling. No new fracture evident. No dislocation. There is moderate narrowing of each hip joint. There is degenerative change in the lower lumbar region. There is distal aortic and bilateral iliac artery atherosclerotic calcification. IMPRESSION: Screw fixation through a subcapital femoral neck fracture with alignment anatomic. Old fractures of the right superior pubic ramus and ischium with remodeling. Symmetric narrowing both hip joints. Aortoiliac atherosclerosis noted. Aortic Atherosclerosis (ICD10-I70.0). Electronically Signed   By: Bretta Bang III M.D.   On: 07/18/2018 13:52    Scheduled Meds: . atorvastatin  40 mg Oral q1800  . benazepril  40 mg Oral Daily  . carvedilol  6.25 mg Oral BID WC  . docusate sodium  100 mg Oral BID  .  donepezil  10 mg Oral Daily  . feeding supplement  1 Container Oral Q24H  . furosemide  40 mg Oral Daily  . pregabalin  75 mg Oral QODAY  . umeclidinium-vilanterol  1 puff Inhalation q morning - 10a    Continuous Infusions: .  ceFAZolin (ANCEF) IV 2 g (07/18/18 2157)  . heparin 750 Units/hr (07/18/18 2344)  . lactated ringers 10 mL/hr at 07/18/18 0948  . methocarbamol (ROBAXIN) IV       Time spent: I have personally reviewed and interpreted on  07/19/2018 daily labs, imagings as discussed above under date review session and assessment and plans.  I reviewed all nursing notes, pharmacy notes, consultant notes,  vitals, pertinent old records  I have discussed plan of care as described above with RN , patient and family on 07/19/2018   Albertine Grates MD, PhD  [facility] Pager [phone]. If 7PM-7AM, please contact night-coverage at [email or web address], password [facility] 07/19/2018, 9:01 AM   LOS: 3 days

## 2018-07-19 NOTE — Care Management Important Message (Signed)
Important Message  Patient Details  Name: [name] MRN: [id number] Date of Birth: 15-Jun-1926   Medicare Important Message Given:  Yes    [name] 07/19/2018, 1:34 PM

## 2018-07-19 NOTE — Progress Notes (Signed)
Orthopaedic Trauma Service (OTS)  1 Day Post-Op Procedure(s) (LRB): RIGHT CANNULATED HIP PINNING (Right)  Subjective: Patient reports pain as feels better than preop with weight bearing. Mild.    Objective: Current Vitals Blood pressure (!) 138/48, pulse 63, temperature 97.7 F (36.5 C), resp. rate 16, height 5\' 2"  (1.575 m), weight 45.6 kg, SpO2 97 %. Vital signs in last 24 hours: Temp:  [95.4 F (35.2 C)-98.6 F (37 C)] 97.7 F (36.5 C) (09/04 0654) Pulse Rate:  [49-78] 63 (09/04 0654) Resp:  [8-21] 16 (09/04 0654) BP: (122-180)/(23-114) 138/48 (09/04 0654) SpO2:  [92 %-100 %] 97 % (09/04 0654)  Intake/Output from previous day: 09/03 0701 - 09/04 0700 In: 1127 [I.V.:450; Blood:593; IV Piggyback:84] Out: 350 [Urine:300; Blood:50]  LABS Recent Labs    07/16/18 1617 07/18/18 0315  HGB 8.7* 7.4*   Recent Labs    07/16/18 1617 07/18/18 0315  WBC 6.9 6.4  RBC 2.93* 2.50*  HCT 28.5* 24.4*  PLT 283 235   Recent Labs    07/16/18 1617 07/18/18 0315  NA 141 143  K 4.5 4.2  CL 103 110  CO2 32 28  BUN 32* 32*  CREATININE 1.34* 1.11*  GLUCOSE 106* 98  CALCIUM 9.1 8.3*   Recent Labs    07/17/18 0353 07/18/18 0315  INR 2.10 1.66     Physical Exam RLE  Dressing intact, clean, dry--pristine  Edema/ swelling controlled  Sens: DPN, SPN, TN intact  Motor: EHL, FHL, and lessor toe ext and flex all intact  Brisk cap refill, warm to touch, DP 2+  Assessment/Plan: 1 Day Post-Op Procedure(s) (LRB): RIGHT CANNULATED HIP PINNING (Right) 1. PT/OT wbat 2. DVT proph Coumadin 3. F/u 14 days post d/c, likely tomorrow to [facility] if available  Myrene Galas, MD [facility] [phone] (p)

## 2018-07-19 NOTE — Progress Notes (Signed)
ANTICOAGULATION CONSULT NOTE  Pharmacy Consult:  Heparin / Coumadin Indication:  History of AVR   Allergies  Allergen Reactions  . Codeine Nausea And Vomiting  . Penicillins Other (See Comments)    Unknown reaction - listed on MAR Has patient had a PCN reaction causing immediate rash, facial/tongue/throat swelling, SOB or lightheadedness with hypotension: Unknown Has patient had a PCN reaction causing severe rash involving mucus membranes or skin necrosis: Unknown Has patient had a PCN reaction that required hospitalization: Unknown Has patient had a PCN reaction occurring within the last 10 years: Unknown If all of the above answers are "NO", then may proceed with Cephalosporin use.    Patient Measurements: Height: 5\' 2"  (157.5 cm) Weight: 100 lb 9.6 oz (45.6 kg) IBW/kg (Calculated) : 50.1 Heparin Dosing Weight: 45 kg  Vital Signs: Temp: 97.7 F (36.5 C) (09/04 0654) Temp Source: Oral (09/03 2325) BP: 138/48 (09/04 0654) Pulse Rate: 63 (09/04 0654)  Labs: Recent Labs    07/16/18 1617 07/17/18 0353 07/18/18 0315 07/18/18 2209 07/19/18 0820  HGB 8.7*  --  7.4*  --  10.0*  HCT 28.5*  --  24.4*  --  31.8*  PLT 283  --  235  --  200  LABPROT  --  23.4* 19.4*  --  17.2*  INR  --  2.10 1.66  --  1.42  HEPARINUNFRC  --   --   --  <0.10* 0.20*  CREATININE 1.34*  --  1.11*  --   --     Estimated Creatinine Clearance: 24.7 mL/min (A) (by C-G formula based on SCr of 1.11 mg/dL (H)).    Assessment: 58 YOF with history of AVR on Coumadin PTA.  Patient presented with hip pain requiring surgery on 07/18/18.  Heparin started post-op and Coumadin to be resumed today.  Heparin level is sub-therapeutic and INR is also low given that it has been on hold.  No bleeding reported.   Home Coumadin dose: 2.5mg  daily except 5mg  on Sat   Goal of Therapy:  Heparin level 0.3-0.7 units/ml  INR 2 - 3 Monitor platelets by anticoagulation protocol: Yes    Plan:  Increase heparin gtt to  850 units/hr Check 8 hr heparin level Coumadin 4mg  PO today Daily heparin level, PT / INR and CBC   Saeed Toren D. Laney Potash, PharmD, BCPS, BCCCP 07/19/2018, 10:08 AM

## 2018-07-19 NOTE — Evaluation (Signed)
Physical Therapy Evaluation Patient Details Name: [name] MRN: [id number] DOB: [birth year] Today's Date: 07/19/2018   History of Present Illness  84yo female with hx of R hip pain starting in early August, fell on August 12th and went to [facility] where she was found to be negative for fracture, but diagnosed with CHF exacerbation and ended up going to [facility]. She continued to have pain at [facility], had repeat imaging which was positive for R hip fracture. She received R cannulated hip pinning on 07/18/18. PMH CHF, COPD, HTN, L BBB, macular degeneration, CVA, transient global amnesia, hx CABG   Clinical Impression   Patient received on bedside commode with nurse tech and family present, pleasant and willing to work with skilled PT services today. She is able to perform functional transfers with RW, min guard and significantly extended time, min guard required for balance assist during transfers even with B UE support. Able to gait train approximately 54f today with RW and min guard for safety and balance assist, limited by pain and fatigue. SpO2 remained between 96-99% during activity on 4LPM O2, patient and family confirm she does use O2 at home. She requires ModA for return to bed due to pain and general LE weakness. She was left in bed with all needs met, bed alarm active, and family present. She will continue to benefit from skilled PT services in the acute setting as well as from return [facility] to continue addressing functional deficits and reduce fall risk prior to potential return home.     Follow Up Recommendations SNF;Supervision/Assistance - 24 hour    Equipment Recommendations  Other (comment)(defer to next venue )    Recommendations for Other Services       Precautions / Restrictions Precautions Precautions: Fall Restrictions Weight Bearing Restrictions: Yes RLE Weight Bearing: Weight bearing as tolerated      Mobility  Bed Mobility Overal bed mobility:  Needs Assistance Bed Mobility: Sit to Supine       Sit to supine: Mod assist;HOB elevated   General bed mobility comments: ModA for return to bed, management of B LEs   Transfers Overall transfer level: Needs assistance Equipment used: Rolling walker (2 wheeled) Transfers: Sit to/from Stand Sit to Stand: Min guard Stand pivot transfers: Min guard       General transfer comment: Min guard and significantly increased time to complete all functional transfers with RW, mildly unsteady   Ambulation/Gait Ambulation/Gait assistance: Min guard Gait Distance (Feet): 50 Feet Assistive device: Rolling walker (2 wheeled) Gait Pattern/deviations: Step-through pattern;Decreased step length - right;Decreased step length - left;Decreased stance time - right;Decreased stride length;Decreased weight shift to right;Antalgic;Trunk flexed Gait velocity: decreased   General Gait Details: antalgic, slow gait pattern limited by pain and fatigue; Min guard provided due to general unsteadiness even with RW   Stairs            Wheelchair Mobility    Modified Rankin (Stroke Patients Only)       Balance Overall balance assessment: Needs assistance Sitting-balance support: Bilateral upper extremity supported;Feet supported Sitting balance-Leahy Scale: Good     Standing balance support: Bilateral upper extremity supported;During functional activity Standing balance-Leahy Scale: Fair Standing balance comment: reliant on B UE support                              Pertinent Vitals/Pain Pain Assessment: Faces Faces Pain Scale: Hurts even more Pain Location: R  hip with weight bearing  Pain Descriptors / Indicators: Aching;Sore Pain Intervention(s): Limited activity within patient's tolerance;Monitored during session;Repositioned    Home Living Family/patient expects to be discharged to:: Skilled nursing facility Living Arrangements: Alone               Additional  Comments: family and patient anticipate return to [facility] rather than straight home     Prior Function Level of Independence: Needs assistance   Gait / Transfers Assistance Needed: household ambulator with RW  ADL's / Homemaking Assistance Needed: home aides 3 pm to 6pm x 3 days/week, daughter helps PRN  Comments: active at gym      Hand Dominance        Extremity/Trunk Assessment   Upper Extremity Assessment Upper Extremity Assessment: Defer to OT evaluation    Lower Extremity Assessment Lower Extremity Assessment: Generalized weakness    Cervical / Trunk Assessment Cervical / Trunk Assessment: Kyphotic  Communication   Communication: No difficulties  Cognition Arousal/Alertness: Awake/alert Behavior During Therapy: WFL for tasks assessed/performed Overall Cognitive Status: Within Functional Limits for tasks assessed                                        General Comments General comments (skin integrity, edema, etc.): SpO2 remained between 96-99% on 4LPM O2 with gait    Exercises     Assessment/Plan    PT Assessment Patient needs continued PT services  PT Problem List Decreased strength;Decreased knowledge of use of DME;Decreased activity tolerance;Decreased safety awareness;Decreased balance;Decreased knowledge of precautions;Decreased mobility;Decreased coordination       PT Treatment Interventions DME instruction;Balance training;Gait training;Neuromuscular re-education;Stair training;Functional mobility training;Patient/family education;Therapeutic activities;Therapeutic exercise    PT Goals (Current goals can be found in the Care Plan section)  Acute Rehab PT Goals Patient Stated Goal: go back to [facility] and get stronger  PT Goal Formulation: With patient/family Time For Goal Achievement: 08/02/18 Potential to Achieve Goals: Good    Frequency Min 2X/week   Barriers to discharge        Co-evaluation                AM-PAC PT "6 Clicks" Daily Activity  Outcome Measure Difficulty turning over in bed (including adjusting bedclothes, sheets and blankets)?: Unable Difficulty moving from lying on back to sitting on the side of the bed? : Unable Difficulty sitting down on and standing up from a chair with arms (e.g., wheelchair, bedside commode, etc,.)?: A Little Help needed moving to and from a bed to chair (including a wheelchair)?: A Little Help needed walking in hospital room?: A Little Help needed climbing 3-5 steps with a railing? : Total 6 Click Score: 12    End of Session Equipment Utilized During Treatment: Oxygen Activity Tolerance: Patient tolerated treatment well;Patient limited by fatigue;Patient limited by pain Patient left: in bed;with call bell/phone within reach;with bed alarm set;with family/visitor present Nurse Communication: Mobility status PT Visit Diagnosis: Unsteadiness on feet (R26.81);Difficulty in walking, not elsewhere classified (R26.2);Muscle weakness (generalized) (M62.81);History of falling (Z91.81)    Time: 1000-1035 PT Time Calculation (min) (ACUTE ONLY): 35 min   Charges:   PT Evaluation $PT Eval Moderate Complexity: 1 Mod PT Treatments $Gait Training: 8-22 mins        Deniece Ree PT, DPT, CBIS  Supplemental Physical Therapist [place]    Pager [phone] Acute Rehab Office [phone]

## 2018-07-19 NOTE — Progress Notes (Signed)
Progress Note  Patient Name: [name] Date of Encounter: 07/19/2018  Primary Cardiologist: Dr Jens Som  Subjective   Pt denies CP or dyspnea  Inpatient Medications    Scheduled Meds: . atorvastatin  40 mg Oral q1800  . carvedilol  6.25 mg Oral BID WC  . docusate sodium  100 mg Oral BID  . donepezil  10 mg Oral Daily  . feeding supplement  1 Container Oral Q24H  . pregabalin  75 mg Oral QODAY  . umeclidinium-vilanterol  1 puff Inhalation q morning - 10a   Continuous Infusions: .  ceFAZolin (ANCEF) IV 2 g (07/18/18 2157)  . heparin 750 Units/hr (07/18/18 2344)  . lactated ringers 10 mL/hr at 07/18/18 0948  . methocarbamol (ROBAXIN) IV     PRN Meds: albuterol, ALPRAZolam, bisacodyl, hydrALAZINE, HYDROcodone-acetaminophen, Melatonin, methocarbamol **OR** methocarbamol (ROBAXIN) IV, metoprolol tartrate, morphine injection, polyethylene glycol   Vital Signs    Vitals:   07/18/18 1913 07/18/18 2114 07/18/18 2325 07/19/18 0654  BP: (!) 154/53 (!) 128/55 (!) 122/50 (!) 138/48  Pulse: (!) 59 60 60 63  Resp: 15 15 15 16   Temp: (!) 96.9 F (36.1 C) (!) 97.5 F (36.4 C) 97.7 F (36.5 C) 97.7 F (36.5 C)  TempSrc: Oral Oral Oral   SpO2: 96% 96% 95% 97%  Weight:      Height:        Intake/Output Summary (Last 24 hours) at 07/19/2018 0837 Last data filed at 07/19/2018 0641 Gross per 24 hour  Intake 1127 ml  Output 350 ml  Net 777 ml   Filed Weights   07/16/18 1531  Weight: 45.6 kg    Physical Exam   GEN: No acute distress.  WD WN Neck: supple Cardiac: RRR, crisp mechanical valve sound Respiratory: Clear to auscultation bilaterally; no wheeze. GI: Soft, NT/ND  MS: No edema, s/p surgery Neuro: Grossly intact   Labs    Chemistry Recent Labs  Lab 07/16/18 1617 07/18/18 0315  NA 141 143  K 4.5 4.2  CL 103 110  CO2 32 28  GLUCOSE 106* 98  BUN 32* 32*  CREATININE 1.34* 1.11*  CALCIUM 9.1 8.3*  GFRNONAA 34* 43*  GFRAA 39* 50*  ANIONGAP 6 5      Hematology Recent Labs  Lab 07/16/18 1617 07/18/18 0315  WBC 6.9 6.4  RBC 2.93* 2.50*  HGB 8.7* 7.4*  HCT 28.5* 24.4*  MCV 97.3 97.6  MCH 29.7 29.6  MCHC 30.5 30.3  RDW 14.1 13.6  PLT 283 235     Radiology    Dg C-arm 1-60 Min  Result Date: 07/18/2018 CLINICAL DATA:  Right hip surgery. EXAM: DG C-ARM 61-120 MIN; OPERATIVE RIGHT HIP WITH PELVIS COMPARISON:  07/16/2018. FINDINGS: Surgical screws in the right hip. Hardware intact. Anatomic alignment. IMPRESSION: Postsurgical changes right hip with anatomic alignment. Electronically Signed   By: Maisie Fus  Register   On: 07/18/2018 12:41   Dg Hip Operative Unilat With Pelvis Right  Result Date: 07/18/2018 CLINICAL DATA:  Right hip surgery. EXAM: DG C-ARM 61-120 MIN; OPERATIVE RIGHT HIP WITH PELVIS COMPARISON:  07/16/2018. FINDINGS: Surgical screws in the right hip. Hardware intact. Anatomic alignment. IMPRESSION: Postsurgical changes right hip with anatomic alignment. Electronically Signed   By: Maisie Fus  Register   On: 07/18/2018 12:41   Dg Hip Unilat With Pelvis 2-3 Views Right  Result Date: 07/18/2018 CLINICAL DATA:  Screw fixation for fracture EXAM: DG HIP (WITH OR WITHOUT PELVIS) 2-3V RIGHT COMPARISON:  Pelvis and right hip  July 16, 2018; intraoperative images right hip July 18, 2018 FINDINGS: Frontal pelvis as well as frontal and lateral right hip images were obtained. There are 3 screws transfixing a subcapital femoral neck fracture on the right with screw tips in the proximal femoral head on the right. Alignment at fracture site is essentially anatomic. There is evidence of old trauma involving the medial right superior pubic ramus and medial right ischium with remodeling. No new fracture evident. No dislocation. There is moderate narrowing of each hip joint. There is degenerative change in the lower lumbar region. There is distal aortic and bilateral iliac artery atherosclerotic calcification. IMPRESSION: Screw fixation through  a subcapital femoral neck fracture with alignment anatomic. Old fractures of the right superior pubic ramus and ischium with remodeling. Symmetric narrowing both hip joints. Aortoiliac atherosclerosis noted. Aortic Atherosclerosis (ICD10-I70.0). Electronically Signed   By: Bretta Bang III M.D.   On: 07/18/2018 13:52    Patient Profile     84 y.o. female 84 year old female with past medical history of coronary artery disease status post coronary artery bypass graft, prior mechanical aortic valve replacement, chronic combined systolic/diastolic congestive heart failure, hypertension, hyperlipidemia, COPD for preoperative evaluation prior to repair of hip fracture.  Assessment & Plan    1 status post repair of hip fracture-Per orthopedics.  2 chronic combined systolic/diastolic congestive heart failure-I have personally reviewed the patient's prior echocardiogram from March.  Her ejection fraction is approximately 35%.  Plan to resume Lasix 40 mg daily.  Follow renal function.  Following discharge she will weigh daily and take an additional 20 mg of Lasix for weight gain of 2 to 3 pounds.  Follow low-sodium diet and fluid restriction.  3 cardiomyopathy-continue carvedilol.  Resume Lotensin 40 mg daily.  4 prior aortic valve replacement-patient back on IV heparin.  Resume Coumadin when okay with orthopedics.  5 hypertension-resume Lotensin and Lasix and follow blood pressure as outlined above.  6 coronary artery disease-continue statin.  7 postoperative anemia-May need transfusion.  Will leave to primary care.  For questions or updates, please contact [facility] Please consult [email or web address] for contact info under Cardiology/STEMI.      Signed, [name], MD  07/19/2018, 8:37 AM

## 2018-07-20 ENCOUNTER — Ambulatory Visit: Payer: Medicare Other | Admitting: Pharmacist

## 2018-07-20 LAB — CBC
HEMATOCRIT: 30.3 % — AB (ref 36.0–46.0)
HEMOGLOBIN: 9.5 g/dL — AB (ref 12.0–15.0)
MCH: 29.8 pg (ref 26.0–34.0)
MCHC: 31.4 g/dL (ref 30.0–36.0)
MCV: 95 fL (ref 78.0–100.0)
Platelets: 168 10*3/uL (ref 150–400)
RBC: 3.19 MIL/uL — AB (ref 3.87–5.11)
RDW: 14.4 % (ref 11.5–15.5)
WBC: 8.5 10*3/uL (ref 4.0–10.5)

## 2018-07-20 LAB — BASIC METABOLIC PANEL
ANION GAP: 6 (ref 5–15)
BUN: 18 mg/dL (ref 8–23)
CALCIUM: 8.7 mg/dL — AB (ref 8.9–10.3)
CO2: 29 mmol/L (ref 22–32)
Chloride: 106 mmol/L (ref 98–111)
Creatinine, Ser: 0.97 mg/dL (ref 0.44–1.00)
GFR calc non Af Amer: 50 mL/min — ABNORMAL LOW (ref >60)
GFR, EST AFRICAN AMERICAN: 58 mL/min — AB (ref >60)
GLUCOSE: 95 mg/dL (ref 70–99)
Potassium: 3.7 mmol/L (ref 3.5–5.1)
Sodium: 141 mmol/L (ref 135–145)

## 2018-07-20 LAB — MAGNESIUM: Magnesium: 1.9 mg/dL (ref 1.7–2.4)

## 2018-07-20 LAB — PROTIME-INR
INR: 1.48
Prothrombin Time: 17.8 seconds — ABNORMAL HIGH (ref 11.4–15.2)

## 2018-07-20 LAB — HEPARIN LEVEL (UNFRACTIONATED): HEPARIN UNFRACTIONATED: 0.45 [IU]/mL (ref 0.30–0.70)

## 2018-07-20 MED ORDER — ACETAMINOPHEN 500 MG PO TABS
500.0000 mg | ORAL_TABLET | Freq: Three times a day (TID) | ORAL | Status: DC
  Administered 2018-07-20 – 2018-07-21 (×5): 500 mg via ORAL
  Filled 2018-07-20 (×5): qty 1

## 2018-07-20 MED ORDER — WARFARIN SODIUM 4 MG PO TABS
4.0000 mg | ORAL_TABLET | Freq: Once | ORAL | Status: AC
  Administered 2018-07-20: 4 mg via ORAL
  Filled 2018-07-20: qty 1

## 2018-07-20 NOTE — NC FL2 (Signed)
[place] MEDICAID FL2 LEVEL OF CARE SCREENING TOOL     IDENTIFICATION  Patient Name: [name] Birthdate: [birth year] Sex: female Admission Date (Current Location): 07/16/2018  [facility] and [place] Number:  [employer] and Address:  [facility], [address], [place], [place] [address]      Provider Number: [id number]  Attending Physician Name and Address:  Albertine Grates, MD  Relative Name and Phone Number:  Zenovia Jarred; [phone]    Current Level of Care: Hospital Recommended Level of Care: Skilled Nursing Facility Prior Approval Number:    Date Approved/Denied:   PASRR Number: [id number]  Discharge Plan: SNF    Current Diagnoses: Patient Active Problem List   Diagnosis Date Noted  . Protein-calorie malnutrition, severe 07/18/2018  . Hip fracture (HCC) 07/16/2018  . COPD exacerbation (HCC) 07/01/2018  . Right hip pain 07/01/2018  . Chronic respiratory failure with hypoxia, on home O2 therapy (HCC) 07/01/2018  . CHF (congestive heart failure) (HCC) 06/28/2018  . Acute diastolic heart failure (HCC) 06/27/2018  . Anemia 06/27/2018  . Congestive dilated cardiomyopathy (HCC) 09/29/2015  . Encounter for therapeutic drug monitoring 12/12/2013  . Transient confusion 04/16/2013  . Mild malnutrition (HCC) 11/28/2012  . Dehydration 11/27/2012  . Dementia 11/27/2012  . Pneumonia 11/24/2012  . Aortic valve disorders 01/13/2011  . Carotid artery stenosis 10/13/2009  . S/P AVR (aortic valve replacement) 10/13/2009  . Hyperlipidemia 03/06/2009  . Essential hypertension 03/06/2009  . CAD, ARTERY BYPASS GRAFT 03/06/2009  . LBBB 03/06/2009  . CVA 03/06/2009    Orientation RESPIRATION BLADDER Height & Weight     Self, Time, Place, Situation  O2(nasal canula 4L) Continent Weight: 100 lb 9.6 oz (45.6 kg) Height:  5\' 2"  (157.5 cm)  BEHAVIORAL SYMPTOMS/MOOD NEUROLOGICAL BOWEL NUTRITION STATUS      Continent Diet(see  discharge summary)  AMBULATORY STATUS COMMUNICATION OF NEEDS Skin   Extensive Assist Verbally Surgical wounds(incision on hip with hydrocolloid dressing)                       Personal Care Assistance Level of Assistance  Feeding, Bathing, Dressing Bathing Assistance: Maximum assistance Feeding assistance: Independent Dressing Assistance: Maximum assistance     Functional Limitations Info  Sight, Hearing, Speech Sight Info: Adequate Hearing Info: Impaired Speech Info: Adequate    SPECIAL CARE FACTORS FREQUENCY  PT (By licensed PT), OT (By licensed OT)     PT Frequency: 5x week OT Frequency: 5x week            Contractures Contractures Info: Not present    Additional Factors Info  Code Status, Allergies, Psychotropic Code Status Info: DNR Allergies Info: CODEINE, PENICILLINS  Psychotropic Info: donepezil (ARICEPT) tablet 10 mg daily PO         Current Medications (07/20/2018):  This is the current hospital active medication list Current Facility-Administered Medications  Medication Dose Route Frequency Provider Last Rate Last Dose  . acetaminophen (TYLENOL) tablet 500 mg  500 mg Oral Q8H Montez Morita, PA-C   500 mg at 07/20/18 1009  . albuterol (PROVENTIL) (2.5 MG/3ML) 0.083% nebulizer solution 2.5 mg  2.5 mg Nebulization Q4H PRN Montez Morita, PA-C      . ALPRAZolam Prudy Feeler) tablet 0.25 mg  0.25 mg Oral QHS PRN Montez Morita, PA-C   0.25 mg at 07/17/18 2216  . atorvastatin (LIPITOR) tablet 40 mg  40 mg Oral q1800 Montez Morita, PA-C   40 mg at 07/19/18  1958  . benazepril (LOTENSIN) tablet 40 mg  40 mg Oral Daily Lewayne Bunting, MD   40 mg at 07/20/18 1005  . bisacodyl (DULCOLAX) EC tablet 5 mg  5 mg Oral Daily PRN Montez Morita, PA-C      . carvedilol (COREG) tablet 6.25 mg  6.25 mg Oral BID WC Montez Morita, PA-C   6.25 mg at 07/20/18 8099  . docusate sodium (COLACE) capsule 100 mg  100 mg Oral BID Montez Morita, PA-C   100 mg at 07/20/18 1004  . donepezil (ARICEPT) tablet  10 mg  10 mg Oral Daily Montez Morita, PA-C   10 mg at 07/20/18 1005  . feeding supplement (BOOST / RESOURCE BREEZE) liquid 1 Container  1 Container Oral Q24H Montez Morita, PA-C   1 Container at 07/19/18 1400  . furosemide (LASIX) tablet 40 mg  40 mg Oral Daily Lewayne Bunting, MD   40 mg at 07/20/18 1007  . heparin ADULT infusion 100 units/mL (25000 units/271mL sodium chloride 0.45%)  900 Units/hr Intravenous Continuous Earnie Larsson, RPH 9 mL/hr at 07/20/18 1701 900 Units/hr at 07/20/18 1701  . hydrALAZINE (APRESOLINE) injection 10 mg  10 mg Intravenous Q6H PRN Montez Morita, PA-C   10 mg at 07/18/18 1743  . HYDROcodone-acetaminophen (NORCO/VICODIN) 5-325 MG per tablet 1-2 tablet  1-2 tablet Oral Q6H PRN Montez Morita, PA-C   1 tablet at 07/20/18 8338  . lactated ringers infusion   Intravenous Continuous Montez Morita, PA-C 10 mL/hr at 07/18/18 2505    . Melatonin TABS 3 mg  3 mg Oral QHS PRN Montez Morita, PA-C      . methocarbamol (ROBAXIN) tablet 500 mg  500 mg Oral Q6H PRN Montez Morita, PA-C   500 mg at 07/19/18 1057   Or  . methocarbamol (ROBAXIN) 500 mg in dextrose 5 % 50 mL IVPB  500 mg Intravenous Q6H PRN Montez Morita, PA-C      . morphine 2 MG/ML injection 0.5 mg  0.5 mg Intravenous Q2H PRN Montez Morita, PA-C      . polyethylene glycol (MIRALAX / GLYCOLAX) packet 17 g  17 g Oral Daily PRN Montez Morita, PA-C   17 g at 07/19/18 1959  . pregabalin (LYRICA) capsule 75 mg  75 mg Oral Pecola Leisure, PA-C   75 mg at 07/19/18 1044  . umeclidinium-vilanterol (ANORO ELLIPTA) 62.5-25 MCG/INH 1 puff  1 puff Inhalation q morning - 10a Montez Morita, PA-C   1 puff at 07/18/18 3976  . warfarin (COUMADIN) tablet 4 mg  4 mg Oral ONCE-1800 Gerilyn Nestle, RPH      . Warfarin - Pharmacist Dosing Inpatient   Does not apply q1800 Gerilyn Nestle, [facility]         Discharge Medications: Please see discharge summary for a list of discharge medications.  Relevant Imaging Results:  Relevant Lab Results:   Additional  Information SS# [id number] [place], [place]

## 2018-07-20 NOTE — Progress Notes (Signed)
PROGRESS NOTE  [name] ZOX:[id number] DOB: 1926-10-15 DOA: 07/16/2018 PCP: Kari Baars, MD  HPI/Recap of past 24 hours:  She is sitting up in chair,  She is on heparin drip denies pain, no sob, no fever  daughters at bedside  Assessment/Plan: Principal Problem:   Hip fracture (HCC) Active Problems:   Hyperlipidemia   Essential hypertension   CAD, ARTERY BYPASS GRAFT   S/P AVR (aortic valve replacement)   Dementia   Anemia   Chronic respiratory failure with hypoxia, on home O2 therapy (HCC)   Protein-calorie malnutrition, severe   R subacute non-displaced hip fracture  -  with history of one mechanical fall, however says there was some pain even prior to the fall,  S/p RIGHT CANNULATED HIP PINNING  On 9/3 with cardiology for preop eval Ortho oked to resume coumadin postop weightbearing status, wound care, pain management per ortho Will require SNF post postop, preferably in [place] where she lives.  Post op anemia, with h/o Anemia of chronic disease S/p prbcx2 units on 9/3 hgb with appropriate improvement post transfusion Repeat cbc in am is stable  CAD status post mechanical aortic valve placement. -  chronic combined systolic/diastolic congestive heart failure She is on chronic Coumadin at home, coumadin held, she is on heparin bridging for surgery Now ortho oked to resume coumadin   currently stable chest pain-free, EKG nonacute,    Continue beta-blocker and statin for secondary prevention.   Home meds lasix/acei per cardiology  CKD 3 with mild ARF on presentation.   ACE inhibitor held initially, resumed on 9/4,  ARF resolved after gentle hydration creatinine back to baseline.  HTN  - Continue Coreg,  ACE  Inhibitor held initially  due to ARF, resumed on 9/4 PRN IV hydralazine and Lopressor as needed.  HLD  - Continue Lipitor  Chronic respiratory failure from COPD, on home O2 4liters at baseline - this is due to underlying COPD, currently  compensated, continue home nebulized medications and as needed nebulizer, continue oxygen.  No wheezing on exam shortness of breath, no acute issue.   Dementia - Continue Aricept is at risk for delirium.  Minimize narcotics and benzodiazepines.    Code Status: DNR  Family Communication: patient and daughters at bedside  Disposition Plan: snf , hopefully in am    Consultants:  Ortho  cardiology  Procedures:  S/p RIGHT CANNULATED HIP PINNING  On 9/3  Antibiotics:  Perioperative abx   Objective: BP (!) 149/38 (BP Location: Left Arm)   Pulse (!) 50   Temp 97.7 F (36.5 C) (Oral)   Resp 16   Ht 5\' 2"  (1.575 m)   Wt 45.6 kg   SpO2 100%   BMI 18.40 kg/m   Intake/Output Summary (Last 24 hours) at 07/20/2018 1929 Last data filed at 07/20/2018 1730 Gross per 24 hour  Intake 1235.61 ml  Output 6 ml  Net 1229.61 ml   Filed Weights   07/16/18 1531  Weight: 45.6 kg    Exam: Patient is examined daily including today on 07/20/2018, exams remain the same as of yesterday except that has changed    General:  NAD  Cardiovascular: RRR  Respiratory: Mild basilar crackles, left > right,  Abdomen: Soft/ND/NT, positive BS  Musculoskeletal: No Edema  Neuro: alert, oriented to person with baseline dementia  Data Reviewed: Basic Metabolic Panel: Recent Labs  Lab 07/16/18 1617 07/18/18 0315 07/19/18 0820 07/20/18 0422  NA 141 143 138 141  K 4.5 4.2 4.0 3.7  CL 103 110 107 106  CO2 32 28 25 29   GLUCOSE 106* 98 142* 95  BUN 32* 32* 21 18  CREATININE 1.34* 1.11* 1.04* 0.97  CALCIUM 9.1 8.3* 9.2 8.7*  MG  --   --   --  1.9   Liver Function Tests: No results for input(s): AST, ALT, ALKPHOS, BILITOT, PROT, ALBUMIN in the last 168 hours. No results for input(s): LIPASE, AMYLASE in the last 168 hours. No results for input(s): AMMONIA in the last 168 hours. CBC: Recent Labs  Lab 07/16/18 1617 07/18/18 0315 07/19/18 0820 07/20/18 0422  WBC 6.9 6.4 10.0 8.5    NEUTROABS 4.6  --   --   --   HGB 8.7* 7.4* 10.0* 9.5*  HCT 28.5* 24.4* 31.8* 30.3*  MCV 97.3 97.6 96.1 95.0  PLT 283 235 200 168   Cardiac Enzymes:   No results for input(s): CKTOTAL, CKMB, CKMBINDEX, TROPONINI in the last 168 hours. BNP (last 3 results) Recent Labs    06/26/18 1935  BNP 1,329.0*    ProBNP (last 3 results) No results for input(s): PROBNP in the last 8760 hours.  CBG: No results for input(s): GLUCAP in the last 168 hours.  Recent Results (from the past 240 hour(s))  Surgical pcr screen     Status: None   Collection Time: 07/17/18  3:57 AM  Result Value Ref Range Status   MRSA, PCR NEGATIVE NEGATIVE Final   Staphylococcus aureus NEGATIVE NEGATIVE Final    Comment: (NOTE) The Xpert SA Assay (FDA approved for NASAL specimens in patients 53 years of age and older), is one component of a comprehensive surveillance program. It is not intended to diagnose infection nor to guide or monitor treatment. Performed at [facility], [address] [place], [place] [address]      Studies: No results found.  Scheduled Meds: . acetaminophen  500 mg Oral Q8H  . atorvastatin  40 mg Oral q1800  . benazepril  40 mg Oral Daily  . carvedilol  6.25 mg Oral BID WC  . docusate sodium  100 mg Oral BID  . donepezil  10 mg Oral Daily  . feeding supplement  1 Container Oral Q24H  . furosemide  40 mg Oral Daily  . pregabalin  75 mg Oral QODAY  . umeclidinium-vilanterol  1 puff Inhalation q morning - 10a  . Warfarin - Pharmacist Dosing Inpatient   Does not apply q1800    Continuous Infusions: . heparin 900 Units/hr (07/20/18 1701)  . lactated ringers 10 mL/hr at 07/18/18 0948  . methocarbamol (ROBAXIN) IV       Time spent: I have personally reviewed and interpreted on  07/20/2018 daily labs, imagings as discussed above under date review session and assessment and plans.  I reviewed all nursing notes, pharmacy notes, consultant notes,  vitals, pertinent  old records  I have discussed plan of care as described above with RN , patient and family on 07/20/2018   Albertine Grates MD, PhD  [facility] Pager [phone]. If 7PM-7AM, please contact night-coverage at [email or web address], password [facility] 07/20/2018, 7:29 PM  LOS: 4 days

## 2018-07-20 NOTE — Progress Notes (Signed)
Progress Note  Patient Name: [name] Date of Encounter: 07/20/2018  Primary Cardiologist: Dr Jens Som  Subjective   No CP or dyspnea  Inpatient Medications    Scheduled Meds: . acetaminophen  500 mg Oral Q8H  . atorvastatin  40 mg Oral q1800  . benazepril  40 mg Oral Daily  . carvedilol  6.25 mg Oral BID WC  . docusate sodium  100 mg Oral BID  . donepezil  10 mg Oral Daily  . feeding supplement  1 Container Oral Q24H  . furosemide  40 mg Oral Daily  . pregabalin  75 mg Oral QODAY  . umeclidinium-vilanterol  1 puff Inhalation q morning - 10a  . warfarin  4 mg Oral ONCE-1800  . Warfarin - Pharmacist Dosing Inpatient   Does not apply q1800   Continuous Infusions: . heparin 900 Units/hr (07/19/18 2217)  . lactated ringers 10 mL/hr at 07/18/18 0948  . methocarbamol (ROBAXIN) IV     PRN Meds: albuterol, ALPRAZolam, bisacodyl, hydrALAZINE, HYDROcodone-acetaminophen, Melatonin, methocarbamol **OR** methocarbamol (ROBAXIN) IV, morphine injection, polyethylene glycol   Vital Signs    Vitals:   07/19/18 1700 07/19/18 1942 07/20/18 0401 07/20/18 0804  BP: (!) 145/46 (!) 146/49 131/70 (!) 146/38  Pulse: 71 74 60 60  Resp:  15 16   Temp:  98.5 F (36.9 C) 98.6 F (37 C) (!) 97.5 F (36.4 C)  TempSrc:  Oral Oral Oral  SpO2: 96% 98% 99% 100%  Weight:      Height:        Intake/Output Summary (Last 24 hours) at 07/20/2018 1324 Last data filed at 07/20/2018 1049 Gross per 24 hour  Intake 665.48 ml  Output 3 ml  Net 662.48 ml   Filed Weights   07/16/18 1531  Weight: 45.6 kg    Physical Exam   GEN: NAD Neck: supple, no JVD Cardiac: RRR; crisp valve Respiratory: CTA GI: Soft, NT/ND, no masses MS: No edema Neuro: no focal findings   Labs    Chemistry Recent Labs  Lab 07/18/18 0315 07/19/18 0820 07/20/18 0422  NA 143 138 141  K 4.2 4.0 3.7  CL 110 107 106  CO2 28 25 29   GLUCOSE 98 142* 95  BUN 32* 21 18  CREATININE 1.11* 1.04* 0.97  CALCIUM  8.3* 9.2 8.7*  GFRNONAA 43* 46* 50*  GFRAA 50* 54* 58*  ANIONGAP 5 6 6      Hematology Recent Labs  Lab 07/18/18 0315 07/19/18 0820 07/20/18 0422  WBC 6.4 10.0 8.5  RBC 2.50* 3.31* 3.19*  HGB 7.4* 10.0* 9.5*  HCT 24.4* 31.8* 30.3*  MCV 97.6 96.1 95.0  MCH 29.6 30.2 29.8  MCHC 30.3 31.4 31.4  RDW 13.6 14.7 14.4  PLT 235 200 168     Radiology    Dg Hip Unilat With Pelvis 2-3 Views Right  Result Date: 07/18/2018 CLINICAL DATA:  Screw fixation for fracture EXAM: DG HIP (WITH OR WITHOUT PELVIS) 2-3V RIGHT COMPARISON:  Pelvis and right hip July 16, 2018; intraoperative images right hip July 18, 2018 FINDINGS: Frontal pelvis as well as frontal and lateral right hip images were obtained. There are 3 screws transfixing a subcapital femoral neck fracture on the right with screw tips in the proximal femoral head on the right. Alignment at fracture site is essentially anatomic. There is evidence of old trauma involving the medial right superior pubic ramus and medial right ischium with remodeling. No new fracture evident. No dislocation. There is moderate narrowing of  each hip joint. There is degenerative change in the lower lumbar region. There is distal aortic and bilateral iliac artery atherosclerotic calcification. IMPRESSION: Screw fixation through a subcapital femoral neck fracture with alignment anatomic. Old fractures of the right superior pubic ramus and ischium with remodeling. Symmetric narrowing both hip joints. Aortoiliac atherosclerosis noted. Aortic Atherosclerosis (ICD10-I70.0). Electronically Signed   By: Bretta Bang III M.D.   On: 07/18/2018 13:52    Patient Profile     84 y.o. female 84 year old female with past medical history of coronary artery disease status post coronary artery bypass graft, prior mechanical aortic valve replacement, chronic combined systolic/diastolic congestive heart failure, hypertension, hyperlipidemia, COPD for preoperative evaluation  prior to repair of hip fracture.  Assessment & Plan    1 status post repair of hip fracture-Per orthopedics.  2 chronic combined systolic/diastolic congestive heart failure-I have personally reviewed the patient's prior echocardiogram from March.  Her ejection fraction is approximately 35%.  Patient remains euvolemic on examination.  Continue Lasix at present dose.  She will take an additional 20 mg daily for weight gain of 2 to 3 pounds following discharge.  Continue fluid restriction and low-sodium diet.    3 cardiomyopathy-continue present dose of beta-blocker and ACE inhibitor.  4 prior aortic valve replacement-patient back on IV heparin.  Would continue heparin while in-house.  Continue Coumadin.  5 hypertension-medications resumed yesterday.  Follow blood pressure and adjust regimen as needed.  6 coronary artery disease-continue statin.  [facility] will sign off.   Medication Recommendations: Continue present cardiac medications as outlined in MAR. Other recommendations (labs, testing, etc): Would check INR 1 week following discharge. Follow up as an outpatient: Follow-up with me 4 to 6 weeks following discharge. For questions or updates, please contact [facility] Please consult [email or web address] for contact info under Cardiology/STEMI.      Signed, [name], MD  07/20/2018, 1:24 PM

## 2018-07-20 NOTE — Progress Notes (Signed)
ANTICOAGULATION CONSULT NOTE  Pharmacy Consult:  Heparin / Coumadin Indication:  History of AVR   Allergies  Allergen Reactions  . Codeine Nausea And Vomiting  . Penicillins Other (See Comments)    Unknown reaction - listed on MAR Has patient had a PCN reaction causing immediate rash, facial/tongue/throat swelling, SOB or lightheadedness with hypotension: Unknown Has patient had a PCN reaction causing severe rash involving mucus membranes or skin necrosis: Unknown Has patient had a PCN reaction that required hospitalization: Unknown Has patient had a PCN reaction occurring within the last 10 years: Unknown If all of the above answers are "NO", then may proceed with Cephalosporin use.    Patient Measurements: Height: 5\' 2"  (157.5 cm) Weight: 100 lb 9.6 oz (45.6 kg) IBW/kg (Calculated) : 50.1 Heparin Dosing Weight: 45 kg  Vital Signs: Temp: 98.6 F (37 C) (09/05 0401) Temp Source: Oral (09/05 0401) BP: 131/70 (09/05 0401) Pulse Rate: 60 (09/05 0401)  Labs: Recent Labs    07/18/18 0315  07/19/18 0820 07/19/18 1826 07/20/18 0422  HGB 7.4*  --  10.0*  --  9.5*  HCT 24.4*  --  31.8*  --  30.3*  PLT 235  --  200  --  168  LABPROT 19.4*  --  17.2*  --  17.8*  INR 1.66  --  1.42  --  1.48  HEPARINUNFRC  --    < > 0.20* 0.35 0.45  CREATININE 1.11*  --  1.04*  --  0.97   < > = values in this interval not displayed.    Estimated Creatinine Clearance: 28.3 mL/min (by C-G formula based on SCr of 0.97 mg/dL).    Assessment: 39 YOF with history of AVR on Coumadin PTA.  Patient presented with hip pain requiring surgery on 07/18/18.  Heparin started post-op and Coumadin resumed on 07/19/18.  Heparin level is therapeutic and INR is low.  Unsure whether Coumadin was given last night (charted duplicate on MAR).  No bleeding reported.   Home Coumadin dose: 2.5mg  daily except 5mg  on Sat   Goal of Therapy:  Heparin level 0.3-0.7 units/ml  INR 2 - 3 Monitor platelets by  anticoagulation protocol: Yes    Plan:  Continue heparin gtt at 900 units/hr Repeat Coumadin 4mg  PO today Daily PT / INR, heparin level and CBC   Annisa Mazzarella D. Laney Potash, PharmD, BCPS, BCCCP 07/20/2018, 8:06 AM

## 2018-07-20 NOTE — Progress Notes (Signed)
Orthopedic Trauma Service Progress Note   Patient ID: ARYAHI DENZLER MRN: [id number] DOB/AGE: Jan 25, 1926 84 y.o.  Subjective:  Doing well this am  Sitting in chair  Pain tolerable, mostly sore  Very apprehensive about getting up   No new issues of note   Review of Systems  Constitutional: Negative for chills and fever.  Respiratory: Negative for shortness of breath and wheezing.   Cardiovascular: Negative for chest pain and palpitations.  Gastrointestinal: Negative for abdominal pain, nausea and vomiting.  Neurological: Negative for tingling and sensory change.    Objective:   VITALS:   Vitals:   07/19/18 1700 07/19/18 1942 07/20/18 0401 07/20/18 0804  BP: (!) 145/46 (!) 146/49 131/70 (!) 146/38  Pulse: 71 74 60 60  Resp:  15 16   Temp:  98.5 F (36.9 C) 98.6 F (37 C) (!) 97.5 F (36.4 C)  TempSrc:  Oral Oral Oral  SpO2: 96% 98% 99% 100%  Weight:      Height:        Estimated body mass index is 18.4 kg/m as calculated from the following:   Height as of this encounter: 5\' 2"  (1.575 m).   Weight as of this encounter: 45.6 kg.   Intake/Output      09/04 0701 - 09/05 0700 09/05 0701 - 09/06 0700   P.O. 840    I.V. (mL/kg) 305.5 (6.7)    Blood     IV Piggyback     Total Intake(mL/kg) 1145.5 (25.1)    Urine (mL/kg/hr)     Stool 1    Blood     Total Output 1    Net +1144.5         Urine Occurrence 5 x 1 x     LABS  Results for orders placed or performed during the hospital encounter of 07/16/18 (from the past 24 hour(s))  Heparin level (unfractionated)     Status: None   Collection Time: 07/19/18  6:26 PM  Result Value Ref Range   Heparin Unfractionated 0.35 0.30 - 0.70 IU/mL  Basic metabolic panel     Status: Abnormal   Collection Time: 07/20/18  4:22 AM  Result Value Ref Range   Sodium 141 135 - 145 mmol/L   Potassium 3.7 3.5 - 5.1 mmol/L   Chloride 106 98 - 111 mmol/L   CO2 29 22 - 32 mmol/L   Glucose, Bld 95 70 -  99 mg/dL   BUN 18 8 - 23 mg/dL   Creatinine, Ser 9.56 0.44 - 1.00 mg/dL   Calcium 8.7 (L) 8.9 - 10.3 mg/dL   GFR calc non Af Amer 50 (L) >60 mL/min   GFR calc Af Amer 58 (L) >60 mL/min   Anion gap 6 5 - 15  CBC     Status: Abnormal   Collection Time: 07/20/18  4:22 AM  Result Value Ref Range   WBC 8.5 4.0 - 10.5 K/uL   RBC 3.19 (L) 3.87 - 5.11 MIL/uL   Hemoglobin 9.5 (L) 12.0 - 15.0 g/dL   HCT 21.3 (L) 08.6 - 57.8 %   MCV 95.0 78.0 - 100.0 fL   MCH 29.8 26.0 - 34.0 pg   MCHC 31.4 30.0 - 36.0 g/dL   RDW 46.9 62.9 - 52.8 %   Platelets 168 150 - 400 K/uL  Heparin level (unfractionated)     Status: None   Collection Time: 07/20/18  4:22 AM  Result Value Ref Range   Heparin Unfractionated 0.45 0.30 - 0.70  IU/mL  Protime-INR     Status: Abnormal   Collection Time: 07/20/18  4:22 AM  Result Value Ref Range   Prothrombin Time 17.8 (H) 11.4 - 15.2 seconds   INR 1.48   Magnesium     Status: None   Collection Time: 07/20/18  4:22 AM  Result Value Ref Range   Magnesium 1.9 1.7 - 2.4 mg/dL     PHYSICAL EXAM:   Gen: sitting in bedside chair, NAD, appears well  Lungs: breathing unlabored Ext:       Right Lower Extremity   Dressing c/d/i  Ext warm  Moderate sarcopenia   + DP pulse  No pain with axial loading or log rolling of hip   Motor and sensory functions intact   No DCT     Assessment/Plan: 2 Days Post-Op   Principal Problem:   Hip fracture (HCC) Active Problems:   Hyperlipidemia   Essential hypertension   CAD, ARTERY BYPASS GRAFT   S/P AVR (aortic valve replacement)   Dementia   Anemia   Chronic respiratory failure with hypoxia, on home O2 therapy (HCC)   Protein-calorie malnutrition, severe   Anti-infectives (From admission, onward)   Start     Dose/Rate Route Frequency Ordered Stop   07/18/18 1515  ceFAZolin (ANCEF) IVPB 2g/100 mL premix     2 g 200 mL/hr over 30 Minutes Intravenous Every 12 hours 07/18/18 1505 07/19/18 1116   07/18/18 0945  ceFAZolin  (ANCEF) IVPB 2g/100 mL premix     2 g 200 mL/hr over 30 Minutes Intravenous On call to O.R. 07/18/18 0941 07/18/18 1147   07/18/18 0942  ceFAZolin (ANCEF) 2-4 GM/100ML-% IVPB    Note to Pharmacy:  Sabino Niemann   : cabinet override      07/18/18 0942 07/18/18 1127    .  POD/HD#: 2  84 y/o female with subacute R femoral neck fracture  -subacute R femoral neck fracture s/p percutaneous fixation   WBAT   ROM as tolerated R hip   PT/OT  Ice PRN   Dressing changes as needed   Dc to SNF tomorrow    - Pain management:  Pt taking minimal hydrocodone/apap  Will added schedule APAP   500 mg po q8h   - ABL anemia/Hemodynamics  Stable  - Medical issues   Per Primary   - DVT/PE prophylaxis:  Chronic anticoagulation resumed  - ID:   periop abx completed  - Metabolic Bone Disease:  Low energy femoral neck fracture = osteoporosis   Check metabolic bone labs   Pt has history of vitamin d deficiency, on Vitamin d2 at home   RD consult to optimize nutrition in setting of sarcopenia, osteoporosis and other chronic medical issues - Activity:  WBAT R leg   - FEN/GI prophylaxis/Foley/Lines:  Reg diet    Supplement per RD guidelines   - Impediments to fracture healing:  Osteoporosis   Fracture pattern  Chronic medical issues  Chronic meds (lasix)  - Dispo:  Continue with therapies   [facility] tomorrow  Follow up with ortho in 10-14 days     Mearl Latin, PA-C [facility] [phone] (P) [phone] Traci Sermon (C) 07/20/2018, 9:39 AM

## 2018-07-21 ENCOUNTER — Non-Acute Institutional Stay (SKILLED_NURSING_FACILITY): Payer: Medicare Other | Admitting: Internal Medicine

## 2018-07-21 DIAGNOSIS — X58XXXD Exposure to other specified factors, subsequent encounter: Secondary | ICD-10-CM | POA: Diagnosis present

## 2018-07-21 DIAGNOSIS — Z9981 Dependence on supplemental oxygen: Secondary | ICD-10-CM | POA: Diagnosis not present

## 2018-07-21 DIAGNOSIS — D649 Anemia, unspecified: Secondary | ICD-10-CM | POA: Diagnosis not present

## 2018-07-21 DIAGNOSIS — J9611 Chronic respiratory failure with hypoxia: Secondary | ICD-10-CM

## 2018-07-21 DIAGNOSIS — M96840 Postprocedural hematoma of a musculoskeletal structure following a musculoskeletal system procedure: Secondary | ICD-10-CM | POA: Diagnosis not present

## 2018-07-21 DIAGNOSIS — Z951 Presence of aortocoronary bypass graft: Secondary | ICD-10-CM | POA: Diagnosis not present

## 2018-07-21 DIAGNOSIS — D508 Other iron deficiency anemias: Secondary | ICD-10-CM | POA: Diagnosis not present

## 2018-07-21 DIAGNOSIS — Z23 Encounter for immunization: Secondary | ICD-10-CM | POA: Diagnosis not present

## 2018-07-21 DIAGNOSIS — E7800 Pure hypercholesterolemia, unspecified: Secondary | ICD-10-CM | POA: Diagnosis present

## 2018-07-21 DIAGNOSIS — E785 Hyperlipidemia, unspecified: Secondary | ICD-10-CM | POA: Diagnosis present

## 2018-07-21 DIAGNOSIS — S7011XA Contusion of right thigh, initial encounter: Secondary | ICD-10-CM | POA: Diagnosis not present

## 2018-07-21 DIAGNOSIS — Z9181 History of falling: Secondary | ICD-10-CM | POA: Diagnosis not present

## 2018-07-21 DIAGNOSIS — I10 Essential (primary) hypertension: Secondary | ICD-10-CM | POA: Diagnosis not present

## 2018-07-21 DIAGNOSIS — R262 Difficulty in walking, not elsewhere classified: Secondary | ICD-10-CM | POA: Diagnosis not present

## 2018-07-21 DIAGNOSIS — M6281 Muscle weakness (generalized): Secondary | ICD-10-CM | POA: Diagnosis not present

## 2018-07-21 DIAGNOSIS — S72001D Fracture of unspecified part of neck of right femur, subsequent encounter for closed fracture with routine healing: Secondary | ICD-10-CM | POA: Diagnosis not present

## 2018-07-21 DIAGNOSIS — R791 Abnormal coagulation profile: Secondary | ICD-10-CM | POA: Diagnosis not present

## 2018-07-21 DIAGNOSIS — Z6821 Body mass index (BMI) 21.0-21.9, adult: Secondary | ICD-10-CM | POA: Diagnosis not present

## 2018-07-21 DIAGNOSIS — I5042 Chronic combined systolic (congestive) and diastolic (congestive) heart failure: Secondary | ICD-10-CM | POA: Diagnosis not present

## 2018-07-21 DIAGNOSIS — J438 Other emphysema: Secondary | ICD-10-CM | POA: Diagnosis not present

## 2018-07-21 DIAGNOSIS — I5033 Acute on chronic diastolic (congestive) heart failure: Secondary | ICD-10-CM

## 2018-07-21 DIAGNOSIS — E43 Unspecified severe protein-calorie malnutrition: Secondary | ICD-10-CM | POA: Diagnosis present

## 2018-07-21 DIAGNOSIS — M2550 Pain in unspecified joint: Secondary | ICD-10-CM | POA: Diagnosis not present

## 2018-07-21 DIAGNOSIS — Z7901 Long term (current) use of anticoagulants: Secondary | ICD-10-CM | POA: Diagnosis not present

## 2018-07-21 DIAGNOSIS — Z952 Presence of prosthetic heart valve: Secondary | ICD-10-CM

## 2018-07-21 DIAGNOSIS — I5022 Chronic systolic (congestive) heart failure: Secondary | ICD-10-CM | POA: Diagnosis not present

## 2018-07-21 DIAGNOSIS — S72001A Fracture of unspecified part of neck of right femur, initial encounter for closed fracture: Secondary | ICD-10-CM | POA: Diagnosis not present

## 2018-07-21 DIAGNOSIS — H3530 Unspecified macular degeneration: Secondary | ICD-10-CM | POA: Diagnosis present

## 2018-07-21 DIAGNOSIS — D72829 Elevated white blood cell count, unspecified: Secondary | ICD-10-CM | POA: Diagnosis present

## 2018-07-21 DIAGNOSIS — I130 Hypertensive heart and chronic kidney disease with heart failure and stage 1 through stage 4 chronic kidney disease, or unspecified chronic kidney disease: Secondary | ICD-10-CM | POA: Diagnosis present

## 2018-07-21 DIAGNOSIS — R2689 Other abnormalities of gait and mobility: Secondary | ICD-10-CM | POA: Diagnosis not present

## 2018-07-21 DIAGNOSIS — R600 Localized edema: Secondary | ICD-10-CM | POA: Diagnosis not present

## 2018-07-21 DIAGNOSIS — I447 Left bundle-branch block, unspecified: Secondary | ICD-10-CM | POA: Diagnosis present

## 2018-07-21 DIAGNOSIS — N183 Chronic kidney disease, stage 3 (moderate): Secondary | ICD-10-CM | POA: Diagnosis present

## 2018-07-21 DIAGNOSIS — Z880 Allergy status to penicillin: Secondary | ICD-10-CM | POA: Diagnosis not present

## 2018-07-21 DIAGNOSIS — J441 Chronic obstructive pulmonary disease with (acute) exacerbation: Secondary | ICD-10-CM | POA: Diagnosis present

## 2018-07-21 DIAGNOSIS — F0390 Unspecified dementia without behavioral disturbance: Secondary | ICD-10-CM | POA: Diagnosis present

## 2018-07-21 DIAGNOSIS — I5032 Chronic diastolic (congestive) heart failure: Secondary | ICD-10-CM | POA: Diagnosis not present

## 2018-07-21 DIAGNOSIS — Z885 Allergy status to narcotic agent status: Secondary | ICD-10-CM | POA: Diagnosis not present

## 2018-07-21 DIAGNOSIS — D62 Acute posthemorrhagic anemia: Secondary | ICD-10-CM | POA: Diagnosis not present

## 2018-07-21 DIAGNOSIS — L89151 Pressure ulcer of sacral region, stage 1: Secondary | ICD-10-CM | POA: Diagnosis present

## 2018-07-21 DIAGNOSIS — Z4789 Encounter for other orthopedic aftercare: Secondary | ICD-10-CM | POA: Diagnosis not present

## 2018-07-21 DIAGNOSIS — Z7401 Bed confinement status: Secondary | ICD-10-CM | POA: Diagnosis not present

## 2018-07-21 DIAGNOSIS — I6529 Occlusion and stenosis of unspecified carotid artery: Secondary | ICD-10-CM | POA: Diagnosis present

## 2018-07-21 DIAGNOSIS — J449 Chronic obstructive pulmonary disease, unspecified: Secondary | ICD-10-CM | POA: Diagnosis not present

## 2018-07-21 DIAGNOSIS — I2510 Atherosclerotic heart disease of native coronary artery without angina pectoris: Secondary | ICD-10-CM | POA: Diagnosis present

## 2018-07-21 DIAGNOSIS — J418 Mixed simple and mucopurulent chronic bronchitis: Secondary | ICD-10-CM | POA: Diagnosis not present

## 2018-07-21 DIAGNOSIS — I110 Hypertensive heart disease with heart failure: Secondary | ICD-10-CM | POA: Diagnosis not present

## 2018-07-21 DIAGNOSIS — E559 Vitamin D deficiency, unspecified: Secondary | ICD-10-CM | POA: Diagnosis present

## 2018-07-21 LAB — PROTIME-INR
INR: 1.78
Prothrombin Time: 20.5 seconds — ABNORMAL HIGH (ref 11.4–15.2)

## 2018-07-21 LAB — CBC
HEMATOCRIT: 28.4 % — AB (ref 36.0–46.0)
HEMOGLOBIN: 8.9 g/dL — AB (ref 12.0–15.0)
MCH: 29.9 pg (ref 26.0–34.0)
MCHC: 31.3 g/dL (ref 30.0–36.0)
MCV: 95.3 fL (ref 78.0–100.0)
Platelets: 155 10*3/uL (ref 150–400)
RBC: 2.98 MIL/uL — ABNORMAL LOW (ref 3.87–5.11)
RDW: 13.9 % (ref 11.5–15.5)
WBC: 7.5 10*3/uL (ref 4.0–10.5)

## 2018-07-21 LAB — BASIC METABOLIC PANEL
ANION GAP: 7 (ref 5–15)
BUN: 20 mg/dL (ref 8–23)
CHLORIDE: 104 mmol/L (ref 98–111)
CO2: 29 mmol/L (ref 22–32)
Calcium: 8.6 mg/dL — ABNORMAL LOW (ref 8.9–10.3)
Creatinine, Ser: 1 mg/dL (ref 0.44–1.00)
GFR calc Af Amer: 56 mL/min — ABNORMAL LOW (ref >60)
GFR calc non Af Amer: 48 mL/min — ABNORMAL LOW (ref >60)
GLUCOSE: 96 mg/dL (ref 70–99)
POTASSIUM: 3.1 mmol/L — AB (ref 3.5–5.1)
Sodium: 140 mmol/L (ref 135–145)

## 2018-07-21 LAB — PREALBUMIN: Prealbumin: 10.9 mg/dL — ABNORMAL LOW (ref 18–38)

## 2018-07-21 LAB — HEPARIN LEVEL (UNFRACTIONATED): Heparin Unfractionated: 0.35 IU/mL (ref 0.30–0.70)

## 2018-07-21 LAB — PHOSPHORUS: PHOSPHORUS: 2.8 mg/dL (ref 2.5–4.6)

## 2018-07-21 LAB — TSH: TSH: 1.942 u[IU]/mL (ref 0.350–4.500)

## 2018-07-21 LAB — MAGNESIUM: MAGNESIUM: 1.9 mg/dL (ref 1.7–2.4)

## 2018-07-21 MED ORDER — SODIUM CHLORIDE 0.9 % IV BOLUS: 500.0000 mL | Freq: Once | INTRAVENOUS | Status: DC

## 2018-07-21 MED ORDER — ENOXAPARIN SODIUM 60 MG/0.6ML ~~LOC~~ SOLN: 1.0000 mg/kg | SUBCUTANEOUS | 0 refills | Status: DC

## 2018-07-21 MED ORDER — WARFARIN SODIUM 4 MG PO TABS
4.0000 mg | ORAL_TABLET | Freq: Once | ORAL | Status: AC
  Administered 2018-07-21: 4 mg via ORAL
  Filled 2018-07-21: qty 1

## 2018-07-21 MED ORDER — FUROSEMIDE 20 MG PO TABS: 20.0000 mg | ORAL_TABLET | Freq: Every day | ORAL | 0 refills | Status: DC

## 2018-07-21 MED ORDER — ACETAMINOPHEN 500 MG PO TABS: 500.0000 mg | ORAL_TABLET | Freq: Three times a day (TID) | ORAL | 0 refills | Status: DC

## 2018-07-21 MED ORDER — FUROSEMIDE 20 MG PO TABS: 40.0000 mg | ORAL_TABLET | Freq: Every day | ORAL | 0 refills | Status: DC

## 2018-07-21 MED ORDER — HYDROCODONE-ACETAMINOPHEN 5-325 MG PO TABS: 1.0000 | ORAL_TABLET | Freq: Four times a day (QID) | ORAL | 0 refills | Status: DC | PRN

## 2018-07-21 MED ORDER — POTASSIUM CHLORIDE CRYS ER 20 MEQ PO TBCR
40.0000 meq | EXTENDED_RELEASE_TABLET | Freq: Once | ORAL | Status: AC
  Administered 2018-07-21: 40 meq via ORAL
  Filled 2018-07-21: qty 2

## 2018-07-21 MED ORDER — ENOXAPARIN SODIUM 60 MG/0.6ML ~~LOC~~ SOLN
1.0000 mg/kg | SUBCUTANEOUS | Status: DC
  Administered 2018-07-21: 45 mg via SUBCUTANEOUS
  Filled 2018-07-21: qty 0.6

## 2018-07-21 MED ORDER — ALPRAZOLAM 0.25 MG PO TABS: 0.2500 mg | ORAL_TABLET | Freq: Every evening | ORAL | 0 refills | Status: DC | PRN

## 2018-07-21 NOTE — Clinical Social Work Note (Addendum)
Clinical Social Worker facilitated patient discharge including contacting patient family and facility to confirm patient discharge plans.  Clinical information faxed to facility and family agreeable with plan.  CSW arranged ambulance transport via [facility] to [facility] .  RN to call [phone] for report prior to discharge.  Clinical Social Worker will sign off for now as social work intervention is no longer needed. Please consult us again if new need arises.  Damiya Sandefur, LCSWA [phone]  

## 2018-07-21 NOTE — Progress Notes (Signed)
This is an acute visit.  Level care is skilled.  Facility is [facility].  Chief complaint- acute visit status post hospitalization for right hip fracture with repair.  History of present illness.  Patient is a pleasant 84 year old female who is readmitted to skilled nursing after hospitalization for a right hip fracture repair.  She recently was admitted here for rehab after hospitalization for diastolic CHF.  She also has a history of coronary artery disease in addition to left bundle branch block- and aortic valve replacement she is on chronic Coumadin with a goal INR of 2.2 up to 3.  She also has a history of hyperlipidemia hypertension and macular degeneration.  Before her previous hospitalization she had fallen at home and studies did not show any acute fracture-that when they found her to be in diastolic CHF however and she was treated for that and discharged to skilled nursing for rehab.  However in rehab she continued to have significant right upper leg pain- and before discharge and x-ray was ordered which showed a new sclerotic line involving the subcapital location of the right femoral neck that likely indicated a subacute minimally impacted nondisplaced fracture-this was a change from the previous x-ray done earlier in August  After discussion with family she was sent to the ER for further evaluation- Orthopedics was consulted in the ER and they requested admission to [facility] for surgery.  Surgery was performed 2 days after secondary to patient being on Coumadin-.  Which was held of course.  She did have a right cannulated hip pinning on September 3.  And has been resumed on Coumadin.  He is touchdown weightbearing for 6 weeks.  She will have follow-up with orthopedics  She did have postop anemia and received 2 units of packed red blood cells-hemoglobin did improve- hemoglobin is 8.9 on today's labs this will warrant repeating.  In regards to coronary  artery disease status post aortic valve placement again she is on Coumadin located with a history of combined systolic and diastolic CHF.  She continues on a beta-blocker and statin- Lasix has been resumed- Lasix dose was increased in hospital she did develop some orthostatic hypotension Lasix was held for 3 days with recommendation to resume and again on Monday at 20 mg cardiology follow-up.  Regards to her anticoagulation INR appears to be rising at 1.79 today we will have this updated tomorrow she is currently on 2.5 mg a day--continues on a Lovenox bridge.   She also has a history of chronic kidney disease stage III creatinine was mildly elevated at 1.34 BUN was 32 on hospital admission ACE inhibitor was held for a while- gentle hydration creatinine returned to baseline at 1.0 with a BUN of 20- ACE inhibitor has been restarted.  She also has history of COPD chronic respiratory failure-on oxygen at baseline- nebulizers appears to be stable apparently this was not an issue in the hospital.  She also has a history of dementia is on Aricept but does have delirium appears to be stronger pain medications- she does have an order for Norco- family would like to minimize this use which is certainly understandable.  Currently she appears to be having some pain in the hip- will write an order for tramadol-  Hopefully  this will help and she would not require much Norcobut will leave the PRN Norco if needed  Vital signs today appear to be stable blood pressure was 142/50.  Again her main complaint is some right hip pain  Past Medical History:  Diagnosis Date  . Carotid artery stenosis    Without infarction  . Chronic combined systolic (congestive) and diastolic (congestive) heart failure (HCC)   . COPD (chronic obstructive pulmonary disease) (HCC)   . Coronary artery disease   . Heart valve replaced by other means   . Hypercholesterolemia    Pure  . Hypertension     Unspecified  . LBBB (left bundle branch block)   . Macular degeneration (senile) of retina, unspecified   . Postsurgical aortocoronary bypass status   . Stroke (HCC)   . Transient global amnesia   . Unspecified hereditary and idiopathic peripheral neuropathy   . Unspecified vitamin D deficiency          Past Surgical History:  Procedure Laterality Date  . AORTIC VALVE REPLACEMENT    . APPENDECTOMY    . CORONARY ARTERY BYPASS GRAFT    . TONSILLECTOMY      Social History        Socioeconomic History  . Marital status: Widowed    Spouse name: Not on file  . Number of children: 2  . Years of education: 12th  . Highest education level: Not on file  Occupational History    Employer: RETIRED  Social Needs  . Financial resource strain: Not on file  . Food insecurity:    Worry: Not on file    Inability: Not on file  . Transportation needs:    Medical: Not on file    Non-medical: Not on file  Tobacco Use  . Smoking status: Former Smoker    Packs/day: 0.50    Types: Cigarettes    Last attempt to quit: 11/30/1991    Years since quitting: 26.6  . Smokeless tobacco: Never Used  . Tobacco comment: Tobacco use-no  Substance and Sexual Activity  . Alcohol use: No  . Drug use: No  . Sexual activity: Never  Lifestyle  . Physical activity:    Days per week: Not on file    Minutes per session: Not on file  . Stress: Not on file  Relationships  . Social connections:    Talks on phone: Not on file    Gets together: Not on file    Attends religious service: Not on file    Active member of club or organization: Not on file    Attends meetings of clubs or organizations: Not on file    Relationship status: Not on file  . Intimate partner violence:    Fear of current or ex partner: Not on file    Emotionally abused: Not on file    Physically abused: Not on file    Forced sexual activity: Not on file  Other Topics  Concern  . Not on file  Social History Narrative   Pt lives at home alone.   Caffeine Use: very rarely        Allergies  Allergen Reactions  . Codeine   . Penicillins          Family History  Problem Relation Age of Onset  . Heart attack Father   . Stroke Sister   . Neuropathy Brother   . COPD Sister      MEDICATIONS     acetaminophen 500 MG tablet Commonly known as:  TYLENOL Take 1 tablet (500 mg total) by mouth every 8 (eight) hours. What changed:    medication strength  how much to take  when to take this  reasons to take this  ALPRAZolam 0.25 MG tablet Commonly known as:  XANAX Take 1 tablet (0.25 mg total) by mouth at bedtime as needed for anxiety.   ANORO ELLIPTA 62.5-25 MCG/INH Aepb Generic drug:  umeclidinium-vilanterol Inhale 1 puff into the lungs daily.   atorvastatin 40 MG tablet Commonly known as:  LIPITOR Take 1 tablet (40 mg total) by mouth daily. What changed:  when to take this   benazepril 40 MG tablet Commonly known as:  LOTENSIN TAKE (1) TABLET BY MOUTH ONCE DAILY. What changed:  See the new instructions.   carvedilol 6.25 MG tablet Commonly known as:  COREG Take 6.25 mg by mouth 2 (two) times daily with a meal.   docusate sodium 100 MG capsule Commonly known as:  COLACE Take 200 mg by mouth at bedtime.   donepezil 10 MG tablet Commonly known as:  ARICEPT TAKE ONE TABLET BY MOUTH ONCE DAILY. What changed:  when to take this   enoxaparin 60 MG/0.6ML injection Commonly known as:  LOVENOX Inject 0.45 mLs (45 mg total) into the skin daily for 3 days.   ergocalciferol 50000 units capsule Commonly known as:  VITAMIN D2 Take 50,000 Units by mouth every Tuesday.   furosemide 20 MG tablet Commonly known as:  LASIX Take 1 tablet (20 mg total) by mouth daily. Start taking on:  07/24/2018 What changed:    medication strength  how much to take  These instructions start on 07/24/2018. If you are unsure  what to do until then, ask your doctor or other care provider.  Another medication with the same name was removed. Continue taking this medication, and follow the directions you see here.   hydrALAZINE 10 MG tablet Commonly known as:  APRESOLINE Take 10 mg by mouth daily as needed (SBP >160).   HYDROcodone-acetaminophen 5-325 MG tablet Commonly known as:  NORCO/VICODIN Take 1-2 tablets by mouth every 6 (six) hours as needed for moderate pain.   levalbuterol 0.63 MG/3ML nebulizer solution Commonly known as:  XOPENEX Take 3 mLs (0.63 mg total) by nebulization every 8 (eight) hours as needed for wheezing or shortness of breath.   meclizine 25 MG tablet Commonly known as:  ANTIVERT Take 25 mg by mouth 3 (three) times daily as needed for dizziness.   Melatonin 3 MG Tabs Take 1 tablet (3 mg total) by mouth at bedtime as needed. What changed:  reasons to take this   ondansetron 4 MG tablet Commonly known as:  ZOFRAN Take 4 mg by mouth every 8 (eight) hours as needed for nausea or vomiting.   polyethylene glycol packet Commonly known as:  MIRALAX / GLYCOLAX Take 17 g by mouth daily.   potassium chloride 10 MEQ tablet Commonly known as:  K-DUR,KLOR-CON Take 1 tablet (10 mEq total) by mouth 2 (two) times daily. What changed:  when to take this   pregabalin 75 MG capsule Commonly known as:  LYRICA Take 1 capsule (75 mg total) by mouth every other day. What changed:    when to take this  additional instructions   warfarin 2.5 MG tablet Commonly known as:  COUMADIN Take as directed. If you are unsure how to take this medication, talk to your nurse Original instructions:  Take 2.5 mg by mouth every evening.      Review of systems.  General she is not complaining of fever chills says she does have some right upper leg pain.  Skin does not complain of rashes or itching surgical site is currently covered.  Head ears eyes  nose mouth and throat is not complaining of  sore throat or visual changes.  Respiratory is not complaining of shortness of breath or cough does have a history of COPD.  Cardiac is not complaining of chest pain or palpitations does not appear to have significant edema except some of the right knee which apparently present later in the hospital stay.  Musculoskeletal as noted above.  Neurologic does not complain of dizziness headache or syncope- apparently in hospital did have some orthostasis during therapy  Psych does not appear to be depressed appears a little anxious however about her pain.  Physical exam  She is afebrile pulse is 72 operations of 18 blood pressure 142/50 manually   In general this is a pleasant elderly female in no acute distress but somewhat uncomfortable with her right hip issues.  Her skin is warm and dry there is covering over the right hip surgical site I did not really see any visible drainage or concerning erythema.  No rashes or itching noted  Eyes visual acuity appears grossly intact she has prescription lenses.  Oropharynx is clear mucous membranes moist.  Chest is clear to auscultation with shallow air entry there is no labored breathing.  Heart is regular rate and rhythm with baseline systolic murmur and echo aortic valve replacement she does not really have significant lower extremity edema does have some cool non-erythematous edema her right knee- pedal pulses are palpable bilaterally   Abdomen is soft nontender with positive bowel sounds.  Musculoskeletal does have some edema of the right knee- there is covering over her surgical site right hip- moves upper extremities at baseline and appears left lower extremity at baseline and very limited right lower extremity because of recent surgery.  Neurologic is grossly intact her speech is clear no lateralizing findings touch sensation appears to be intact bilaterally.  Psych she is alert and oriented pleasant and  appropriate   Labs.  July 21, 2018.  Magnesium level 1.9.  TSH 4.94.  Sodium 140 potassium 3.1 BUN 20 creatinine 1.00.  WBC 7.5 hemoglobin 8.9 platelets 155.  INR-1.78.  Assessment and plan.  1.-History of right hip fracture with repair- she will have orthopedic follow-up apparently tolerated procedure well-pain is an issue does not apparently doing well with stronger pain medications and family would like to avoid the Norco as much as possible.  Will write an order for as needed tramadol 50 mg every 6 hours --if not effective can use the Norco-   She will need PT and OT again blood pressures will have to be watched during therapy.  2.  History of postop anemia she did receive a transfusion of 2 units packed red blood cells- hemoglobin is 8.9 today will have this updated tomorrow- she may need to be on iron.  3.-  History of COPD this appears stable on oxygen as well as on Anoro Ellipta and Xopenex  #4- hypertension again this is somewhat variable blood pressure appears relatively stable manually today but apparently does have some orthostatic hypotension at times- she continues on low-dose Coreg she is also on benazepril 40 mg a day will write an order to hold this for systolic less than 110- she also has an order for as needed hydralazine if systolic is greater than 160.  5.-  History of mechanical aortic valve replacement- goal INR 2.2- 3- INR is rising will have this updated tomorrow-continues on the Lovenox bridge-continues on Coumadin 2.5 mg a day.  6.-History of CHF she is on  Lasix starting on Monday this was held in hospital because of orthostatic issues- this will have to be monitored Clinically appears stable  #7- history of dementia this appears to be mild she is on Aricept continue supportive care.  8.  History of hypokalemia again potassium listed at 3.1 today I suspect this is been supplemented in the hospital continues on 10 mEq twice daily- will update this  tomorrow.  9.  Insomnia continues on melatonin will monitor.  10 history of vertigo continues on Antivert as needed- again I suspect this is somewhat complicated with her history of orthostatic hypotension as well.  Again will update lab work including a CBC metabolic panel PT/INR tomorrow.  We will change pain medication to make tramadol first choice instead of Norco.  Will write an order to hold her ACE inhibitor for systolic less than 110.  [id number] note greater than 45 minutes spent assessing patient- reviewing her chart and labs- discussing her status with her family at bedside- and coordinating and formulating a plan of care for numerous diagnoses- of note greater than 50% of time spent coordinating a plan of care

## 2018-07-21 NOTE — Clinical Social Work Note (Signed)
Clinical Social Work Assessment  Patient Details  Name: [name] MRN: [id number] Date of Birth: 24-Apr-1926  Date of referral:  07/21/18               Reason for consult:  Facility Placement                Permission sought to share information with:  Family Supports Permission granted to share information::  Yes, Verbal Permission Granted, Yes, Release of Information Signed  Name::     [name] or Brett Canales  Agency::  [facility]  Relationship::  daughter, son in Diplomatic Services operational officer Information:  [phone], [phone]  Housing/Transportation Living arrangements for the past 2 months:  Skilled Nursing Facility Source of Information:  Patient, Adult Children Patient Interpreter Needed:  None Criminal Activity/Legal Involvement Pertinent to Current Situation/Hospitalization:  No - Comment as needed Significant Relationships:  Adult Children Lives with:  Self Do you feel safe going back to the place where you live?  No Need for family participation in patient care:  No (Coment)  Care giving concerns:  Pt is alert and oriented. Pt's daughter at bedside.   Social Worker assessment / plan:  CSW spoke with pt at bedside. [facility] was [facility] prior to admission. Pt is agreeable to SNF at d/c and wants to return to [facility]. Pt's daughter states she spoke with facility and that pt's room is still available. CSW spoke with MD and pt will d/c today--facility aware and prepared.  Employment status:  Retired Health and safety inspector:  Medicare PT Recommendations:  Skilled Nursing Facility Information / Referral to community resources:  Skilled Nursing Facility  Patient/Family's Response to care:  Pt verbalized understanding of CSW role and expressed appreciation for support. Pt denies any concern regarding pt care at this time.   Patient/Family's Understanding of and Emotional Response to Diagnosis, Current Treatment, and Prognosis:  Pt understanding and realistic  regarding physical limitations. Pt understands the need for SNF placement at d/c. Pt agreeable to SNF placement at d/c, at this time. Pt's responses emotionally appropriate during conversation with CSW. Pt denies any concern regarding treatment plan at this time. CSW will continue to provide support and facilitate d/c needs.   Emotional Assessment Appearance:  Appears stated age Attitude/Demeanor/Rapport:  (Patient was appropriate) Affect (typically observed):  Accepting, Appropriate, Calm Orientation:  Oriented to Situation, Oriented to  Time, Oriented to Place, Oriented to Self Alcohol / Substance use:  Not Applicable Psych involvement (Current and /or in the community):  No (Comment)  Discharge Needs  Concerns to be addressed:  Basic Needs Readmission within the last 30 days:  No Current discharge risk:  Dependent with Mobility Barriers to Discharge:  Continued Medical Work up   [employer], LCSW 07/21/2018, 10:52 AM

## 2018-07-21 NOTE — Clinical Social Work Placement (Signed)
   CLINICAL SOCIAL WORK PLACEMENT  NOTE  Date:  07/21/2018  Patient Details  Name: [name] MRN: [id number] Date of Birth: [birth year]  Clinical Social Work is seeking post-discharge placement for this patient at the Skilled  Nursing Facility level of care (*CSW will initial, date and re-position this form in  chart as items are completed):      Patient/family provided with [facility] Clinical Social Work Department's list of facilities offering this level of care within the geographic area requested by the patient (or if unable, by the patient's family).  Yes   Patient/family informed of their freedom to choose among providers that offer the needed level of care, that participate in Medicare, Medicaid or managed care program needed by the patient, have an available bed and are willing to accept the patient.      Patient/family informed of [facility] ownership interest in [facility] and [facility], as well as of the fact that they are under no obligation to receive care at these facilities.  PASRR submitted to EDS on       PASRR number received on       Existing PASRR number confirmed on 06/28/18     FL2 transmitted to all facilities in geographic area requested by pt/family on 07/21/18     FL2 transmitted to all facilities within larger geographic area on       Patient informed that his/her managed care company has contracts with or will negotiate with certain facilities, including the following:        Yes   Patient/family informed of bed offers received.  Patient chooses bed at [facility]     Physician recommends and patient chooses bed at      Patient to be transferred to [facility] on 07/21/18.  Patient to be transferred to facility by [facility]     Patient family notified on 07/21/18 of transfer.  Name of family member notified:  [name]     PHYSICIAN Please prepare priority discharge summary, including medications, Please prepare  prescriptions     Additional Comment:    _______________________________________________ Maree Krabbe, LCSW 07/21/2018, 10:54 AM

## 2018-07-21 NOTE — Progress Notes (Signed)
RN called [facility] and gave report to [place], RN pt comfortable, will get pain medication before she leaves.

## 2018-07-21 NOTE — Plan of Care (Signed)
  Problem: Pain Managment: Goal: General experience of comfort will improve Outcome: Progressing   Problem: Safety: Goal: Ability to remain free from injury will improve Outcome: Progressing   

## 2018-07-21 NOTE — Discharge Summary (Addendum)
Discharge Summary  [name] YBF:[id number] DOB: 1926/10/28  PCP: Colleen Baars, MD  Admit date: 07/16/2018 Discharge date: 07/21/2018  Time spent: , more than 50% time spent on coordination of care.  Recommendations for Outpatient Follow-up:  1. F/u with SNF MD  for hospital discharge follow up, repeat cbc/bmp at follow up. snf MD to repeat INR on Monday, further coumadin dose adjustment per SNF MD ( INR goal 2.2-3 per cardiology coumadin clinic instruction) 2. SNF MD to monitor hgb 3. F/u with ortho Dr Carola Frost 4. F/u with cardiology Dr Jens Som  Discharge Diagnoses:  Active Hospital Problems   Diagnosis Date Noted  . Hip fracture (HCC) 07/16/2018  . Protein-calorie malnutrition, severe 07/18/2018  . Chronic respiratory failure with hypoxia, on home O2 therapy (HCC) 07/01/2018  . Anemia 06/27/2018  . Dementia 11/27/2012  . S/P AVR (aortic valve replacement) 10/13/2009  . CAD, ARTERY BYPASS GRAFT 03/06/2009  . Essential hypertension 03/06/2009  . Hyperlipidemia 03/06/2009    Resolved Hospital Problems  No resolved problems to display.    Discharge Condition: stable  Diet recommendation: heart healthy  Filed Weights   07/16/18 1531  Weight: 45.6 kg    History of present illness: (per admitting MD Dr Ophelia Charter) Chief Complaint: Hip pain  HPI: [name] is a 84 y.o. female with medical history significant of CVA; HTN; HLD; CAD s/p CABG with aortic valve replacement (1997); chronic combined heart failure (EF 50-55%, grade 1 diastolic dysfunction); and COPD on home O2 presenting with hip pain.  She began having right hip pain in early August, but did not have any kind of fall.  She was active, going to the gym, did not require assistive devices.  The pain progressed and she began having some muscular pain on the right, as well, and so she started using a walker.   She has had some pain in her back and went to the gym the day before.   She fell on August 12. She  got up to go to the bathroom that AM - she came out with her walker but it flipped over. She was unable to get herself up.  Her LOC fell and her family brought her to the ER - she was having hip pain and they also diagnosed diastolic CHF exacerbation.  She had imaging including a CT with no fracture at that time.  She was eventually discharged to [facility] for rehab.  She has been at [facility] since and was due to be discharged several days from now.  However, her right hip was continuing to bother her and she was unable to bear weight.  She now cannot walk without a walker, but she has made improvements with PT - just having ongoing pain.   [facility] sent her here for a hip x-ray this AM since the pain was ongoing.  They saw the fracture on the xray and sent her here for CT and ortho evaluation.   ED Course:  R hip fracture.  Dr. Louanne Belton requests admission to [facility], likely surgery tomorrow or Tuesday (NPO after midnight).  Fall 3 weeks ago - negative xray.  CHF exacerbation.  Had ongoing pain so PCP ordered another xray.  Hospital Course:  Principal Problem:   Hip fracture (HCC) Active Problems:   Hyperlipidemia   Essential hypertension   CAD, ARTERY BYPASS GRAFT   S/P AVR (aortic valve replacement)   Dementia   Anemia   Chronic respiratory failure with hypoxia, on home O2 therapy (  HCC)   Protein-calorie malnutrition, severe  R subacute non-displaced hip fracture-  with history of one mechanical fall, however says there was some pain even prior to the fall,  S/p RIGHT CANNULATED HIP PINNING  On 9/3 with cardiology for preop eval Ortho oked to resume coumadin postop weightbearing status, wound care, pain management per ortho  Per ortho Dr Carola Frost" touchdown weightbearing for 6 weeks.  Would anticipate some form of bicycle use within a couple of weeks.  She is at risk for nonunion and avascular necrosis with the potential for subsequent total hip arthroplasty conversion. " F/u with Dr  Carola Frost in two weeks  Patient is discharge to snf.  Post op anemia, with h/o Anemia of chronic disease S/p prbcx2 units on 9/3 hgb with appropriate improvement post transfusion Repeat cbc in am is stable SNF MD to continue monitor hgb at hospital discharge follow up.  CAD status post mechanical aortic valve placement. -  chronic combined systolic/diastolic congestive heart failure currently stable chest pain-free, EKG nonacute,   Continue beta-blocker and statin for secondary prevention.  Home meds lasix/acei resumed  per cardiology, she received increased dose of lasix in the hospital, she developed orthostatic hypotension, she received 500cc fluids boluses, hold lasix for three days, resume lasix on Monday at 20mg  daily, follow up with cardiology Dr Jens Som.  She is on chronic Coumadin (goal of INR 2.2-3) at home, coumadin held for surgery initially ortho oked to resume coumadin post op  , she received heparin drip bridging in the hospital, cardiology Dr Jens Som oked to d/c patient on lovenox bridging  INR at discharge is 1.78 Anticipate INR will be at goal for the next 2-3 days, she is discharge on lovenox 1mg /kg q24hrsx 3 doses ( weight and crcl adjusted dose, discussed with pharmacy) SNF MD To repeat INR on Monday, further dosing of coumadin and the need of bridging per snf MD. She received coumadin on 9/6 in the hospital. Her next dose of coumadin will start from 9/7.  CKD 3 with mild ARF on presentation.  Bun/cr on presentation was 32/1.34 ACE inhibitor held initially, resumed on 9/4, ARF resolved after gentle hydration creatinine back to baseline. Bun/cr at discharge is 20/1.  HTN - Continue Coreg,  ACE  Inhibitor held initially  due to ARF, resumed on 9/4   HLD - Continue Lipitor  Chronic respiratory failure from COPD, on home O2 4liters at baseline- this is due to underlying COPD, currently compensated, continue home nebulized medications and as needed  nebulizer, continue oxygen. No wheezing on exam shortness of breath, no acute issue.  Dementia - Continue Aricept is at risk for delirium. Minimize narcotics and benzodiazepines.  Severe malnutrition in context of chronic illness, Underweight nutrition input appreciated  Code Status: DNR  Family Communication: patient and daughters at bedside  Disposition Plan: snf    Consultants:  Ortho  cardiology  Procedures:  S/p RIGHT CANNULATED HIP PINNING  On 9/3 by Dr Carola Frost  Antibiotics:  Perioperative abx   Discharge Exam: BP (!) 142/49 (BP Location: Left Arm)   Pulse 65   Temp 97.6 F (36.4 C) (Oral)   Resp 16   Ht 5\' 2"  (1.575 m)   Wt 45.6 kg   SpO2 94%   BMI 18.40 kg/m    General:  NAD, frail  Cardiovascular: RRR  Respiratory: Mild bibasilar crackles, resolved with deep inspiration  Abdomen: Soft/ND/NT, positive BS  Musculoskeletal: No Edema, right thigh post op changes  Neuro: alert, oriented to person with  baseline dementia   Discharge Instructions You were cared for by a hospitalist during your hospital stay. If you have any questions about your discharge medications or the care you received while you were in the hospital after you are discharged, you can call the unit and asked to speak with the hospitalist on call if the hospitalist that took care of you is not available. Once you are discharged, your primary care physician will handle any further medical issues. Please note that NO REFILLS for any discharge medications will be authorized once you are discharged, as it is imperative that you return to your primary care physician (or establish a relationship with a primary care physician if you do not have one) for your aftercare needs so that they can reassess your need for medications and monitor your lab values.  Discharge Instructions    Diet - low sodium heart healthy   Complete by:  As directed    Increase activity slowly   Complete by:   As directed      Allergies as of 07/21/2018      Reactions   Codeine Nausea And Vomiting   Penicillins Other (See Comments)   Unknown reaction - listed on MAR Has patient had a PCN reaction causing immediate rash, facial/tongue/throat swelling, SOB or lightheadedness with hypotension: Unknown Has patient had a PCN reaction causing severe rash involving mucus membranes or skin necrosis: Unknown Has patient had a PCN reaction that required hospitalization: Unknown Has patient had a PCN reaction occurring within the last 10 years: Unknown If all of the above answers are "NO", then may proceed with Cephalosporin use.      Medication List    STOP taking these medications   ondansetron 4 MG disintegrating tablet Commonly known as:  ZOFRAN-ODT     TAKE these medications   acetaminophen 500 MG tablet Commonly known as:  TYLENOL Take 1 tablet (500 mg total) by mouth every 8 (eight) hours. What changed:    medication strength  how much to take  when to take this  reasons to take this   ALPRAZolam 0.25 MG tablet Commonly known as:  XANAX Take 1 tablet (0.25 mg total) by mouth at bedtime as needed for anxiety.   ANORO ELLIPTA 62.5-25 MCG/INH Aepb Generic drug:  umeclidinium-vilanterol Inhale 1 puff into the lungs daily.   atorvastatin 40 MG tablet Commonly known as:  LIPITOR Take 1 tablet (40 mg total) by mouth daily. What changed:  when to take this   benazepril 40 MG tablet Commonly known as:  LOTENSIN TAKE (1) TABLET BY MOUTH ONCE DAILY. What changed:  See the new instructions.   carvedilol 6.25 MG tablet Commonly known as:  COREG Take 6.25 mg by mouth 2 (two) times daily with a meal.   docusate sodium 100 MG capsule Commonly known as:  COLACE Take 200 mg by mouth at bedtime.   donepezil 10 MG tablet Commonly known as:  ARICEPT TAKE ONE TABLET BY MOUTH ONCE DAILY. What changed:  when to take this   enoxaparin 60 MG/0.6ML injection Commonly known as:   LOVENOX Inject 0.45 mLs (45 mg total) into the skin daily for 3 days.   ergocalciferol 50000 units capsule Commonly known as:  VITAMIN D2 Take 50,000 Units by mouth every Tuesday.   furosemide 20 MG tablet Commonly known as:  LASIX Take 1 tablet (20 mg total) by mouth daily. Start taking on:  07/24/2018 What changed:    medication strength  how much to take  These instructions start on 07/24/2018. If you are unsure what to do until then, ask your doctor or other care provider.  Another medication with the same name was removed. Continue taking this medication, and follow the directions you see here.   hydrALAZINE 10 MG tablet Commonly known as:  APRESOLINE Take 10 mg by mouth daily as needed (SBP >160).   HYDROcodone-acetaminophen 5-325 MG tablet Commonly known as:  NORCO/VICODIN Take 1-2 tablets by mouth every 6 (six) hours as needed for moderate pain.   levalbuterol 0.63 MG/3ML nebulizer solution Commonly known as:  XOPENEX Take 3 mLs (0.63 mg total) by nebulization every 8 (eight) hours as needed for wheezing or shortness of breath.   meclizine 25 MG tablet Commonly known as:  ANTIVERT Take 25 mg by mouth 3 (three) times daily as needed for dizziness.   Melatonin 3 MG Tabs Take 1 tablet (3 mg total) by mouth at bedtime as needed. What changed:  reasons to take this   ondansetron 4 MG tablet Commonly known as:  ZOFRAN Take 4 mg by mouth every 8 (eight) hours as needed for nausea or vomiting.   polyethylene glycol packet Commonly known as:  MIRALAX / GLYCOLAX Take 17 g by mouth daily.   potassium chloride 10 MEQ tablet Commonly known as:  K-DUR,KLOR-CON Take 1 tablet (10 mEq total) by mouth 2 (two) times daily. What changed:  when to take this   pregabalin 75 MG capsule Commonly known as:  LYRICA Take 1 capsule (75 mg total) by mouth every other day. What changed:    when to take this  additional instructions   warfarin 2.5 MG tablet Commonly known as:   COUMADIN Take as directed. If you are unsure how to take this medication, talk to your nurse or doctor. Original instructions:  Take 2.5 mg by mouth every evening.      Allergies  Allergen Reactions  . Codeine Nausea And Vomiting  . Penicillins Other (See Comments)    Unknown reaction - listed on MAR Has patient had a PCN reaction causing immediate rash, facial/tongue/throat swelling, SOB or lightheadedness with hypotension: Unknown Has patient had a PCN reaction causing severe rash involving mucus membranes or skin necrosis: Unknown Has patient had a PCN reaction that required hospitalization: Unknown Has patient had a PCN reaction occurring within the last 10 years: Unknown If all of the above answers are "NO", then may proceed with Cephalosporin use.    Contact information for follow-up providers    Myrene Galas, MD Follow up in 2 week(s).   Specialty:  Orthopedic Surgery Contact information: [address] [place] [address] [phone]        Colleen Baars, MD Follow up.   Specialty:  Pulmonary Disease Contact information: [address] [place] [address] [phone]        Lewayne Bunting, MD Follow up in 4 week(s).   Specialty:  Cardiology Contact information: [address] [place] [address] [phone]            Contact information for after-discharge care    Destination    [facility] Preferred SNF .   Service:  Skilled Nursing Contact information: [address] [place] [address] [phone]                   The results of significant diagnostics from this hospitalization (including imaging, microbiology, ancillary and laboratory) are listed below for reference.  Significant Diagnostic Studies: Dg Chest 1 View  Result Date: 07/16/2018 CLINICAL DATA:  84 year old female with history of right-sided hip fracture. Preoperative evaluation.  EXAM: CHEST  1 VIEW COMPARISON:  Chest x-ray 07/02/2018. FINDINGS: Study is exceedingly limited by marked patient rotation to the right. With these limitations in mind, areas no definite acute consolidative airspace disease. No definite pleural effusions. No evidence of pulmonary edema. Heart size appears enlarged. Upper mediastinal contours are grossly distorted by patient's rotation. Aortic status post median sternotomy for CABG and aortic valve replacement. IMPRESSION: 1. Limited examination demonstrating no definite radiographic evidence of acute cardiopulmonary disease. 2. Aortic atherosclerosis. 3. Cardiomegaly. Electronically Signed   By: Trudie Reed M.D.   On: 07/16/2018 18:20   Dg Chest 2 View  Result Date: 06/26/2018 CLINICAL DATA:  Weakness, right hip pain EXAM: CHEST - 2 VIEW COMPARISON:  03/10/2017, CT chest 03/28/2018 FINDINGS: Sternotomy changes. Patient is rotated. Small bilateral pleural effusions. Hazy airspace disease at the bases. Cardiomegaly with vascular congestion and mild diffuse interstitial opacity consistent with edema. Aortic atherosclerosis. No pneumothorax. Old right-sided rib deformities. Emphysematous disease. IMPRESSION: 1. Cardiomegaly with vascular congestion, mild interstitial pulmonary edema, and small bilateral pleural effusions. 2. Hazy airspace disease at the bases may reflect atelectasis or edema. Electronically Signed   By: Jasmine Pang M.D.   On: 06/26/2018 20:11   Dg Lumbar Spine Complete  Result Date: 07/16/2018 CLINICAL DATA:  84 year old currently in rehabilitation who fell approximately 3 weeks ago while walking with her walker. Progressive low back pain, RIGHT hip pain and RIGHT UPPER leg pain. Subsequent encounter. EXAM: LUMBAR SPINE - COMPLETE 4+ VIEW COMPARISON:  None. FINDINGS: Five non-rib-bearing lumbar vertebrae with anatomic alignment. No fractures. Osseous demineralization. Severe disc space narrowing at L3-4 and L4-5. Remaining disc spaces  well-preserved. Severe LEFT and mild RIGHT facet degenerative changes at L4-5 and L5-S1. No pars defects. Aortoiliac atherosclerosis with evidence of a distal abdominal aortic aneurysm measuring up to approximately 3.5 cm diameter. IMPRESSION: 1. No acute osseous abnormality. 2. Osseous demineralization. 3. Severe degenerative disc disease at L3-4 and L4-5. 4. Severe LEFT and mild RIGHT facet degenerative changes at L4-5 and L5-S1. 5.  Aortic Atherosclerosis (ICD10-I70.0). 6. Aortic aneurysm NOS, measuring up to approximately 3.5 cm diameter. (ICD10-I71.9). Recommend followup by ultrasound in 3 years. This recommendation follows ACR consensus guidelines: White Paper of the ACR Incidental Findings Committee II on Vascular Findings. J Am Coll Radiol 2013; 10:789-794. Electronically Signed   By: Hulan Saas M.D.   On: 07/16/2018 12:30   Ct Hip Right Wo Contrast  Result Date: 07/16/2018 CLINICAL DATA:  Right hip fracture. EXAM: CT OF THE RIGHT HIP WITHOUT CONTRAST TECHNIQUE: Multidetector CT imaging of the right hip was performed according to the standard protocol. Multiplanar CT image reconstructions were also generated. COMPARISON:  Right hip x-rays from same day. CT right hip dated June 26, 2018. FINDINGS: Bones/Joint/Cartilage Subacute common nondisplaced fracture of the right subcapital femoral neck with mild sclerosis of the fracture line. No dislocation. Normal alignment. Small joint effusion. Severe osteopenia. Old right superior inferior pubic rami fractures. Mild right hip osteoarthritis. The Ligaments Ligaments are suboptimally evaluated by CT. Muscles and Tendons Mild diffuse muscle atrophy. Moderate atrophy of the gluteus minimus muscle. Soft tissue No fluid collection or hematoma.  No soft tissue mass. The visualized internal pelvic contents demonstrate small right inguinal hernia now containing only fat. Sigmoid diverticulosis. Vascular calcifications. IMPRESSION: 1. Subacute, minimally  impacted, subcapital right femoral neck fracture. Electronically Signed  By: Obie Dredge M.D.   On: 07/16/2018 17:54   Ct Hip Right Wo Contrast  Result Date: 06/26/2018 CLINICAL DATA:  Right hip pain and shortness of breath. Abnormal radiographs of right hip. EXAM: CT OF THE RIGHT HIP WITHOUT CONTRAST TECHNIQUE: Multidetector CT imaging of the right hip was performed according to the standard protocol. Multiplanar CT image reconstructions were also generated. COMPARISON:  Right hip radiographs 06/26/2018 FINDINGS: Bones/Joint/Cartilage Degenerative changes in the right hip with osteophytes on the acetabular and femoral surfaces. No evidence of acute fracture or dislocation of the right hip. Plain radiographic finding of cortical step-off likely represented osteophyte formation. Old healed fracture deformities are demonstrated in the right superior and inferior pubic rami. There is a small right hip joint effusion. Ligaments Suboptimally assessed by CT. Muscles and Tendons Mild muscle atrophy. No intramuscular mass or hematoma suggested. Soft tissues The bladder is distended, possibly physiologic or possibly due to urinary retention. Vascular calcifications are present. There is a small right inguinal hernia containing small bowel without apparent proximal obstruction. IMPRESSION: No acute fracture or dislocation of the right hip. Degenerative changes in the right hip. Small right hip joint effusion. Small right inguinal hernia containing small bowel without apparent proximal obstruction. Electronically Signed   By: Burman Nieves M.D.   On: 06/26/2018 23:56   Dg Abd Acute W/chest  Result Date: 07/02/2018 CLINICAL DATA:  Nausea vomiting for 2 days. Aortic valve replacement. Ex-smoker. EXAM: DG ABDOMEN ACUTE W/ 1V CHEST COMPARISON:  Chest radiograph 06/26/2018 FINDINGS: Frontal view of the chest demonstrates midline trachea. Patient rotated to the right. Moderate cardiomegaly. Prior median sternotomy.  Atherosclerosis in the transverse aorta. Artifact projecting over the right lung base, without consolidation on upright abdominal image. No pleural effusion or pneumothorax. No congestive failure. Chronic interstitial thickening. Left base scarring. Osteopenia. Abdominal films demonstrate no free intraperitoneal air on upright positioning. Numerous leads and wires project over the upper abdomen and lower chest. No gaseous distention of bowel loops on supine imaging. Aortic atherosclerosis. IMPRESSION: No acute findings. Cardiomegaly without congestive failure. Aortic Atherosclerosis (ICD10-I70.0). Electronically Signed   By: Jeronimo Greaves M.D.   On: 07/02/2018 13:15   Dg C-arm 1-60 Min  Result Date: 07/18/2018 CLINICAL DATA:  Right hip surgery. EXAM: DG C-ARM 61-120 MIN; OPERATIVE RIGHT HIP WITH PELVIS COMPARISON:  07/16/2018. FINDINGS: Surgical screws in the right hip. Hardware intact. Anatomic alignment. IMPRESSION: Postsurgical changes right hip with anatomic alignment. Electronically Signed   By: Maisie Fus  Register   On: 07/18/2018 12:41   Dg Hip Operative Unilat With Pelvis Right  Result Date: 07/18/2018 CLINICAL DATA:  Right hip surgery. EXAM: DG C-ARM 61-120 MIN; OPERATIVE RIGHT HIP WITH PELVIS COMPARISON:  07/16/2018. FINDINGS: Surgical screws in the right hip. Hardware intact. Anatomic alignment. IMPRESSION: Postsurgical changes right hip with anatomic alignment. Electronically Signed   By: Maisie Fus  Register   On: 07/18/2018 12:41   Dg Hip Unilat With Pelvis 2-3 Views Right  Result Date: 07/18/2018 CLINICAL DATA:  Screw fixation for fracture EXAM: DG HIP (WITH OR WITHOUT PELVIS) 2-3V RIGHT COMPARISON:  Pelvis and right hip July 16, 2018; intraoperative images right hip July 18, 2018 FINDINGS: Frontal pelvis as well as frontal and lateral right hip images were obtained. There are 3 screws transfixing a subcapital femoral neck fracture on the right with screw tips in the proximal femoral head on  the right. Alignment at fracture site is essentially anatomic. There is evidence of old trauma involving the medial right superior pubic  ramus and medial right ischium with remodeling. No new fracture evident. No dislocation. There is moderate narrowing of each hip joint. There is degenerative change in the lower lumbar region. There is distal aortic and bilateral iliac artery atherosclerotic calcification. IMPRESSION: Screw fixation through a subcapital femoral neck fracture with alignment anatomic. Old fractures of the right superior pubic ramus and ischium with remodeling. Symmetric narrowing both hip joints. Aortoiliac atherosclerosis noted. Aortic Atherosclerosis (ICD10-I70.0). Electronically Signed   By: Bretta Bang III M.D.   On: 07/18/2018 13:52   Dg Hip Unilat With Pelvis 2-3 Views Right  Result Date: 06/26/2018 CLINICAL DATA:  Right hip and groin pain after a fall EXAM: DG HIP (WITH OR WITHOUT PELVIS) 2-3V RIGHT COMPARISON:  None. FINDINGS: Limited evaluation of the right femoral neck due to superimposition of trochanter. Possible step-off deformity at the right femoral head neck junction. Old appearing fracture deformity of the right superior and inferior pubic rami. IMPRESSION: 1. Limited evaluation of femoral neck due to positioning, questionable step-off deformity at the right femoral head neck junction, suggest CT to better evaluate. 2. Old appearing fractures of the right superior and inferior pubic rami. Electronically Signed   By: Jasmine Pang M.D.   On: 06/26/2018 20:13   Dg Hip Unilat With Pelvis Min 4 Views Right  Result Date: 07/16/2018 CLINICAL DATA:  84 year old currently in rehabilitation who fell approximately 3 weeks ago while walking with her walker. Progressive low back pain, RIGHT hip pain and RIGHT UPPER leg pain. Subsequent encounter. EXAM: DG HIP (WITH OR WITHOUT PELVIS) 4+V RIGHT COMPARISON:  AP pelvis and RIGHT hip x-ray 06/26/2018. RIGHT hip CT 06/26/2018. FINDINGS:  Since the prior examinations 06/26/2018, development of a new sclerotic line involving the subcapital location of the RIGHT femoral neck. No evidence of acute or subacute fractures elsewhere. Mild axial joint space narrowing as noted previously. Osseous demineralization. Included AP pelvis again demonstrates remote healed fractures involving the RIGHT SUPERIOR and INFERIOR pubic rami. Symmetric mild axial joint space narrowing in the contralateral LEFT hip. Sacroiliac joints and symphysis pubis intact without evidence of diastasis. IMPRESSION: 1. New sclerotic line involving the subcapital location of the RIGHT femoral neck, likely indicating a subacute, minimally impacted nondisplaced fracture which was inconspicuous on the prior imaging 06/26/2018. 2. Symmetric mild osteoarthritis in both hips. 3. Osseous demineralization. 4. Remote healed fractures involving the RIGHT SUPERIOR and INFERIOR pubic rami. Electronically Signed   By: Hulan Saas M.D.   On: 07/16/2018 12:23   Dg Femur, Min 2 Views Right  Result Date: 07/16/2018 CLINICAL DATA:  84 year old currently in rehabilitation who fell approximately 3 weeks ago while walking with her walker. Progressive low back pain, RIGHT hip pain and RIGHT UPPER leg pain. Subsequent encounter. EXAM: RIGHT FEMUR 2 VIEWS COMPARISON:  RIGHT hip x-rays obtained concurrently and 06/26/2018. No prior femur imaging. FINDINGS: Osseous demineralization. Sclerotic line involving the subcapital location of the RIGHT femoral neck as described in detail on the concurrent hip imaging. No fractures elsewhere involving the femur. Knee joint intact with moderate MEDIAL and LATERAL compartment joint space narrowing. IMPRESSION: 1. Subacute subcapital RIGHT femoral neck fracture as detailed on the concurrent hip imaging. 2. No fractures elsewhere involving the femur. 3. Osteoarthritis involving the RIGHT knee. Electronically Signed   By: Hulan Saas M.D.   On: 07/16/2018 12:25     Microbiology: Recent Results (from the past 240 hour(s))  Surgical pcr screen     Status: None   Collection Time: 07/17/18  3:57 AM  Result Value Ref Range Status   MRSA, PCR NEGATIVE NEGATIVE Final   Staphylococcus aureus NEGATIVE NEGATIVE Final    Comment: (NOTE) The Xpert SA Assay (FDA approved for NASAL specimens in patients 74 years of age and older), is one component of a comprehensive surveillance program. It is not intended to diagnose infection nor to guide or monitor treatment. Performed at [facility], [address], [place], [place] [address]      Labs: Basic Metabolic Panel: Recent Labs  Lab 07/16/18 1617 07/18/18 0315 07/19/18 0820 07/20/18 0422 07/21/18 0441  NA 141 143 138 141 140  K 4.5 4.2 4.0 3.7 3.1*  CL 103 110 107 106 104  CO2 32 28 25 29 29   GLUCOSE 106* 98 142* 95 96  BUN 32* 32* 21 18 20   CREATININE 1.34* 1.11* 1.04* 0.97 1.00  CALCIUM 9.1 8.3* 9.2 8.7* 8.6*  MG  --   --   --  1.9 1.9  PHOS  --   --   --   --  2.8   Liver Function Tests: No results for input(s): AST, ALT, ALKPHOS, BILITOT, PROT, ALBUMIN in the last 168 hours. No results for input(s): LIPASE, AMYLASE in the last 168 hours. No results for input(s): AMMONIA in the last 168 hours. CBC: Recent Labs  Lab 07/16/18 1617 07/18/18 0315 07/19/18 0820 07/20/18 0422 07/21/18 0441  WBC 6.9 6.4 10.0 8.5 7.5  NEUTROABS 4.6  --   --   --   --   HGB 8.7* 7.4* 10.0* 9.5* 8.9*  HCT 28.5* 24.4* 31.8* 30.3* 28.4*  MCV 97.3 97.6 96.1 95.0 95.3  PLT 283 235 200 168 155   Cardiac Enzymes: No results for input(s): CKTOTAL, CKMB, CKMBINDEX, TROPONINI in the last 168 hours. BNP: BNP (last 3 results) Recent Labs    06/26/18 1935  BNP 1,329.0*    ProBNP (last 3 results) No results for input(s): PROBNP in the last 8760 hours.  CBG: No results for input(s): GLUCAP in the last 168 hours.     Signed:  Albertine Grates MD, PhD  [facility] 07/21/2018, 11:26 AM

## 2018-07-21 NOTE — Progress Notes (Signed)
Physical Therapy Treatment Patient Details Name: [name] MRN: [id number] DOB: [birth year] Today's Date: 07/21/2018    History of Present Illness 84yo female with hx of R hip pain starting in early August, fell on August 12th and went to [facility] where she was found to be negative for fracture, but diagnosed with CHF exacerbation and ended up going to [facility]. She continued to have pain at [facility], had repeat imaging which was positive for R hip fracture. She received R cannulated hip pinning on 07/18/18. PMH CHF, COPD, HTN, L BBB, macular degeneration, CVA, transient global amnesia, hx CABG     PT Comments    Pt sitting in recliner on arrival with daughters at her side, she was eager to mobilize but quickly limited due to dizziness and nausea.  Pt returned to chair in room and BP obtained 71/38 in sitting, moved chair closer to bed and returned to supine to reassess BP 104/47 in supine.  Informed nurse of symptoms and vitals and positioned patient for improved comfort with all needs in reach.     Follow Up Recommendations  SNF;Supervision/Assistance - 24 hour     Equipment Recommendations  Other (comment)(defer to next venue)    Recommendations for Other Services       Precautions / Restrictions Precautions Precautions: Fall Restrictions Weight Bearing Restrictions: Yes RLE Weight Bearing: Weight bearing as tolerated    Mobility  Bed Mobility Overal bed mobility: Needs Assistance Bed Mobility: Sit to Supine     Supine to sit: Mod assist     General bed mobility comments: Pt required mod A to return to supine.  Assistance to lift B LEs and to lower trunk back to bed.    Transfers Overall transfer level: Needs assistance Equipment used: Rolling walker (2 wheeled) Transfers: Sit to/from [employer] Sit to Stand: Min guard;Min assist(min guard to stand, min A to sit.  )   Squat pivot transfers: Mod assist(Pt with dizziness and nausea required  increased assistance to return to bed via squat pivot with out device.  )     General transfer comment: Cues for hand placement to and from seated surface.  Pt is slow and gaurded due to pain.    Ambulation/Gait Ambulation/Gait assistance: Min guard Gait Distance (Feet): 10 Feet(gait required termination due to dizziness and nausea. ) Assistive device: Rolling walker (2 wheeled) Gait Pattern/deviations: Decreased step length - right;Decreased step length - left;Decreased stance time - right;Decreased stride length;Decreased weight shift to right;Antalgic;Trunk flexed;Step-to pattern Gait velocity: decreased   General Gait Details: antalgic, slow gait pattern limited by pain and fatigue; Min guard provided due to general unsteadiness even with RW, gait ending early due to pre syncope.     Stairs             Wheelchair Mobility    Modified Rankin (Stroke Patients Only)       Balance     Sitting balance-Leahy Scale: Fair       Standing balance-Leahy Scale: Poor Standing balance comment: reliant on B UE support                             Cognition Arousal/Alertness: Awake/alert Behavior During Therapy: WFL for tasks assessed/performed Overall Cognitive Status: Within Functional Limits for tasks assessed  Exercises      General Comments        Pertinent Vitals/Pain Pain Assessment: 0-10 Pain Score: 7  Pain Location: R hip with weight bearing  Pain Descriptors / Indicators: Aching;Sore Pain Intervention(s): Monitored during session;Repositioned;Ice applied    Home Living                      Prior Function            PT Goals (current goals can now be found in the care plan section) Acute Rehab PT Goals Patient Stated Goal: go back to [facility] and get stronger  Potential to Achieve Goals: Good Progress towards PT goals: Progressing toward goals    Frequency    Min  2X/week      PT Plan Current plan remains appropriate    Co-evaluation              AM-PAC PT "6 Clicks" Daily Activity  Outcome Measure  Difficulty turning over in bed (including adjusting bedclothes, sheets and blankets)?: Unable Difficulty moving from lying on back to sitting on the side of the bed? : Unable Difficulty sitting down on and standing up from a chair with arms (e.g., wheelchair, bedside commode, etc,.)?: Unable Help needed moving to and from a bed to chair (including a wheelchair)?: A Lot Help needed walking in hospital room?: A Lot Help needed climbing 3-5 steps with a railing? : A Lot 6 Click Score: 9    End of Session Equipment Utilized During Treatment: Oxygen;Gait belt Activity Tolerance: Patient tolerated treatment well;Patient limited by fatigue;Patient limited by pain Patient left: in bed;with call bell/phone within reach;with bed alarm set;with family/visitor present Nurse Communication: Mobility status PT Visit Diagnosis: Unsteadiness on feet (R26.81);Difficulty in walking, not elsewhere classified (R26.2);Muscle weakness (generalized) (M62.81);History of falling (Z91.81)     Time: 4132-4401 PT Time Calculation (min) (ACUTE ONLY): 29 min  Charges:  $Gait Training: 8-22 mins $Therapeutic Activity: 8-22 mins                     Joycelyn Rua, PTA Acute Rehabilitation Services Pager [phone] Office [phone]     [name] 07/21/2018, 11:26 AM

## 2018-07-21 NOTE — Progress Notes (Signed)
ANTICOAGULATION CONSULT NOTE  Pharmacy Consult:  Lovenox / Coumadin Indication:  History of AVR   Allergies  Allergen Reactions  . Codeine Nausea And Vomiting  . Penicillins Other (See Comments)    Unknown reaction - listed on MAR Has patient had a PCN reaction causing immediate rash, facial/tongue/throat swelling, SOB or lightheadedness with hypotension: Unknown Has patient had a PCN reaction causing severe rash involving mucus membranes or skin necrosis: Unknown Has patient had a PCN reaction that required hospitalization: Unknown Has patient had a PCN reaction occurring within the last 10 years: Unknown If all of the above answers are "NO", then may proceed with Cephalosporin use.    Patient Measurements: Height: 5\' 2"  (157.5 cm) Weight: 100 lb 9.6 oz (45.6 kg) IBW/kg (Calculated) : 50.1 Heparin Dosing Weight: 45 kg  Vital Signs: Temp: 97.6 F (36.4 C) (09/06 0404) Temp Source: Oral (09/06 0404) BP: 142/49 (09/06 0404) Pulse Rate: 65 (09/06 1021)  Labs: Recent Labs    07/19/18 0820 07/19/18 1826 07/20/18 0422 07/21/18 0441  HGB 10.0*  --  9.5* 8.9*  HCT 31.8*  --  30.3* 28.4*  PLT 200  --  168 155  LABPROT 17.2*  --  17.8* 20.5*  INR 1.42  --  1.48 1.78  HEPARINUNFRC 0.20* 0.35 0.45 0.35  CREATININE 1.04*  --  0.97 1.00    Estimated Creatinine Clearance: 27.5 mL/min (by C-G formula based on SCr of 1 mg/dL).    Assessment: 6 YOF with history of AVR on Coumadin PTA.  Patient presented with hip pain requiring surgery on 07/18/18.  Heparin started post-op and Coumadin resumed on 07/19/18. Now transitioning to Lovenox / Coumadin bridge for discharge.   INR is subtherapeutic at 1.78 but trending up; unsure whether Coumadin was given 9/4 (charted duplicate on MAR). No bleeding noted, Hgb low stable, platelets are normal but lower than baseline.  Home Coumadin dose: 2.5mg  daily except 5mg  on Sat  Goal of Therapy:  INR 2.2 - 3 per Coumadin clinic Monitor platelets by  anticoagulation protocol: Yes   Plan:  Discontinue heparin drip Lovenox 1 mg/kg SQ q24h to begin 1 hr after heparin drip stopped Repeat Coumadin 4mg  PO today prior to discharge then resume PTA regimen Daily PT / INR and CBC   Loura Back, PharmD, BCPS Clinical Pharmacist Clinical phone for 07/21/2018 until 3p is [phone] Please check AMION for all Pharmacist numbers by unit 07/21/2018 10:58 AM

## 2018-07-22 ENCOUNTER — Encounter (HOSPITAL_COMMUNITY)
Admission: RE | Admit: 2018-07-22 | Discharge: 2018-07-22 | Disposition: A | Discharge status: Home / Self Care | Payer: Medicare Other | Source: Skilled Nursing Facility | Attending: *Deleted | Admitting: *Deleted

## 2018-07-22 ENCOUNTER — Encounter: Payer: Self-pay | Admitting: Internal Medicine

## 2018-07-22 LAB — BASIC METABOLIC PANEL
ANION GAP: 7 (ref 5–15)
BUN: 21 mg/dL (ref 8–23)
CALCIUM: 8.9 mg/dL (ref 8.9–10.3)
CO2: 30 mmol/L (ref 22–32)
CREATININE: 0.85 mg/dL (ref 0.44–1.00)
Chloride: 104 mmol/L (ref 98–111)
GFR calc non Af Amer: 59 mL/min — ABNORMAL LOW (ref >60)
Glucose, Bld: 93 mg/dL (ref 70–99)
Potassium: 4.3 mmol/L (ref 3.5–5.1)
SODIUM: 141 mmol/L (ref 135–145)

## 2018-07-22 LAB — VITAMIN D 25 HYDROXY (VIT D DEFICIENCY, FRACTURES): Vit D, 25-Hydroxy: 74.6 ng/mL (ref 30.0–100.0)

## 2018-07-22 LAB — CBC WITH DIFFERENTIAL/PLATELET
BASOS ABS: 0 10*3/uL (ref 0.0–0.1)
BASOS PCT: 0 %
EOS ABS: 0 10*3/uL (ref 0.0–0.7)
Eosinophils Relative: 0 %
HEMATOCRIT: 23.4 % — AB (ref 36.0–46.0)
HEMOGLOBIN: 7.4 g/dL — AB (ref 12.0–15.0)
Lymphocytes Relative: 11 %
Lymphs Abs: 1 10*3/uL (ref 0.7–4.0)
MCH: 30 pg (ref 26.0–34.0)
MCHC: 31.6 g/dL (ref 30.0–36.0)
MCV: 94.7 fL (ref 78.0–100.0)
MONOS PCT: 12 %
Monocytes Absolute: 1.2 10*3/uL — ABNORMAL HIGH (ref 0.1–1.0)
NEUTROS PCT: 77 %
Neutro Abs: 7.6 10*3/uL (ref 1.7–7.7)
Platelets: 195 10*3/uL (ref 150–400)
RBC: 2.47 MIL/uL — AB (ref 3.87–5.11)
RDW: 14.2 % (ref 11.5–15.5)
WBC: 9.8 10*3/uL (ref 4.0–10.5)

## 2018-07-22 LAB — PROTIME-INR
INR: 2.74
PROTHROMBIN TIME: 28.8 s — AB (ref 11.4–15.2)

## 2018-07-22 LAB — CALCIUM, IONIZED: CALCIUM, IONIZED, SERUM: 4.8 mg/dL (ref 4.5–5.6)

## 2018-07-23 ENCOUNTER — Emergency Department (HOSPITAL_COMMUNITY): Payer: Medicare Other

## 2018-07-23 ENCOUNTER — Encounter (HOSPITAL_COMMUNITY): Payer: Self-pay | Admitting: Emergency Medicine

## 2018-07-23 ENCOUNTER — Encounter (HOSPITAL_COMMUNITY)
Admission: RE | Admit: 2018-07-23 | Discharge: 2018-07-23 | Disposition: A | Discharge status: Home / Self Care | Payer: Medicare Other | Source: Skilled Nursing Facility

## 2018-07-23 ENCOUNTER — Inpatient Hospital Stay (HOSPITAL_COMMUNITY)
Admission: EM | Admit: 2018-07-23 | Discharge: 2018-08-01 | DRG: 604 | Disposition: A | Discharge status: Skilled Nursing Facility | Payer: Medicare Other | Source: Skilled Nursing Facility | Attending: Internal Medicine | Admitting: Internal Medicine

## 2018-07-23 ENCOUNTER — Other Ambulatory Visit: Payer: Self-pay

## 2018-07-23 DIAGNOSIS — H3530 Unspecified macular degeneration: Secondary | ICD-10-CM | POA: Diagnosis present

## 2018-07-23 DIAGNOSIS — Z823 Family history of stroke: Secondary | ICD-10-CM

## 2018-07-23 DIAGNOSIS — J438 Other emphysema: Secondary | ICD-10-CM | POA: Diagnosis not present

## 2018-07-23 DIAGNOSIS — Z885 Allergy status to narcotic agent status: Secondary | ICD-10-CM

## 2018-07-23 DIAGNOSIS — T148XXA Other injury of unspecified body region, initial encounter: Secondary | ICD-10-CM

## 2018-07-23 DIAGNOSIS — I447 Left bundle-branch block, unspecified: Secondary | ICD-10-CM | POA: Diagnosis present

## 2018-07-23 DIAGNOSIS — E559 Vitamin D deficiency, unspecified: Secondary | ICD-10-CM | POA: Diagnosis present

## 2018-07-23 DIAGNOSIS — J418 Mixed simple and mucopurulent chronic bronchitis: Secondary | ICD-10-CM | POA: Diagnosis not present

## 2018-07-23 DIAGNOSIS — I130 Hypertensive heart and chronic kidney disease with heart failure and stage 1 through stage 4 chronic kidney disease, or unspecified chronic kidney disease: Secondary | ICD-10-CM | POA: Diagnosis present

## 2018-07-23 DIAGNOSIS — E7800 Pure hypercholesterolemia, unspecified: Secondary | ICD-10-CM | POA: Diagnosis present

## 2018-07-23 DIAGNOSIS — R791 Abnormal coagulation profile: Secondary | ICD-10-CM | POA: Diagnosis not present

## 2018-07-23 DIAGNOSIS — S72011A Unspecified intracapsular fracture of right femur, initial encounter for closed fracture: Secondary | ICD-10-CM | POA: Diagnosis not present

## 2018-07-23 DIAGNOSIS — R600 Localized edema: Secondary | ICD-10-CM | POA: Diagnosis not present

## 2018-07-23 DIAGNOSIS — Z952 Presence of prosthetic heart valve: Secondary | ICD-10-CM

## 2018-07-23 DIAGNOSIS — J441 Chronic obstructive pulmonary disease with (acute) exacerbation: Secondary | ICD-10-CM | POA: Diagnosis present

## 2018-07-23 DIAGNOSIS — E43 Unspecified severe protein-calorie malnutrition: Secondary | ICD-10-CM | POA: Diagnosis present

## 2018-07-23 DIAGNOSIS — N183 Chronic kidney disease, stage 3 unspecified: Secondary | ICD-10-CM | POA: Diagnosis present

## 2018-07-23 DIAGNOSIS — E785 Hyperlipidemia, unspecified: Secondary | ICD-10-CM | POA: Diagnosis present

## 2018-07-23 DIAGNOSIS — I6529 Occlusion and stenosis of unspecified carotid artery: Secondary | ICD-10-CM | POA: Diagnosis present

## 2018-07-23 DIAGNOSIS — L89151 Pressure ulcer of sacral region, stage 1: Secondary | ICD-10-CM | POA: Diagnosis present

## 2018-07-23 DIAGNOSIS — R2689 Other abnormalities of gait and mobility: Secondary | ICD-10-CM | POA: Diagnosis not present

## 2018-07-23 DIAGNOSIS — M19071 Primary osteoarthritis, right ankle and foot: Secondary | ICD-10-CM | POA: Diagnosis present

## 2018-07-23 DIAGNOSIS — Z951 Presence of aortocoronary bypass graft: Secondary | ICD-10-CM | POA: Diagnosis not present

## 2018-07-23 DIAGNOSIS — Z8249 Family history of ischemic heart disease and other diseases of the circulatory system: Secondary | ICD-10-CM

## 2018-07-23 DIAGNOSIS — I5042 Chronic combined systolic (congestive) and diastolic (congestive) heart failure: Secondary | ICD-10-CM | POA: Diagnosis not present

## 2018-07-23 DIAGNOSIS — I10 Essential (primary) hypertension: Secondary | ICD-10-CM | POA: Diagnosis present

## 2018-07-23 DIAGNOSIS — I2510 Atherosclerotic heart disease of native coronary artery without angina pectoris: Secondary | ICD-10-CM | POA: Diagnosis present

## 2018-07-23 DIAGNOSIS — Z23 Encounter for immunization: Secondary | ICD-10-CM

## 2018-07-23 DIAGNOSIS — R262 Difficulty in walking, not elsewhere classified: Secondary | ICD-10-CM | POA: Diagnosis not present

## 2018-07-23 DIAGNOSIS — D649 Anemia, unspecified: Secondary | ICD-10-CM

## 2018-07-23 DIAGNOSIS — S7011XA Contusion of right thigh, initial encounter: Principal | ICD-10-CM | POA: Diagnosis present

## 2018-07-23 DIAGNOSIS — J9611 Chronic respiratory failure with hypoxia: Secondary | ICD-10-CM | POA: Diagnosis present

## 2018-07-23 DIAGNOSIS — D72829 Elevated white blood cell count, unspecified: Secondary | ICD-10-CM | POA: Diagnosis present

## 2018-07-23 DIAGNOSIS — F0390 Unspecified dementia without behavioral disturbance: Secondary | ICD-10-CM | POA: Diagnosis present

## 2018-07-23 DIAGNOSIS — X58XXXD Exposure to other specified factors, subsequent encounter: Secondary | ICD-10-CM | POA: Diagnosis not present

## 2018-07-23 DIAGNOSIS — J449 Chronic obstructive pulmonary disease, unspecified: Secondary | ICD-10-CM

## 2018-07-23 DIAGNOSIS — Z880 Allergy status to penicillin: Secondary | ICD-10-CM

## 2018-07-23 DIAGNOSIS — M96840 Postprocedural hematoma of a musculoskeletal structure following a musculoskeletal system procedure: Secondary | ICD-10-CM

## 2018-07-23 DIAGNOSIS — D62 Acute posthemorrhagic anemia: Secondary | ICD-10-CM | POA: Diagnosis present

## 2018-07-23 DIAGNOSIS — Z4789 Encounter for other orthopedic aftercare: Secondary | ICD-10-CM | POA: Diagnosis not present

## 2018-07-23 DIAGNOSIS — L8990 Pressure ulcer of unspecified site, unspecified stage: Secondary | ICD-10-CM

## 2018-07-23 DIAGNOSIS — Z7984 Long term (current) use of oral hypoglycemic drugs: Secondary | ICD-10-CM

## 2018-07-23 DIAGNOSIS — Z66 Do not resuscitate: Secondary | ICD-10-CM | POA: Diagnosis present

## 2018-07-23 DIAGNOSIS — Z79899 Other long term (current) drug therapy: Secondary | ICD-10-CM

## 2018-07-23 DIAGNOSIS — R279 Unspecified lack of coordination: Secondary | ICD-10-CM | POA: Diagnosis not present

## 2018-07-23 DIAGNOSIS — Z7901 Long term (current) use of anticoagulants: Secondary | ICD-10-CM | POA: Diagnosis not present

## 2018-07-23 DIAGNOSIS — Z8673 Personal history of transient ischemic attack (TIA), and cerebral infarction without residual deficits: Secondary | ICD-10-CM

## 2018-07-23 DIAGNOSIS — Z6821 Body mass index (BMI) 21.0-21.9, adult: Secondary | ICD-10-CM

## 2018-07-23 DIAGNOSIS — M6281 Muscle weakness (generalized): Secondary | ICD-10-CM | POA: Diagnosis not present

## 2018-07-23 DIAGNOSIS — S72011D Unspecified intracapsular fracture of right femur, subsequent encounter for closed fracture with routine healing: Secondary | ICD-10-CM

## 2018-07-23 DIAGNOSIS — S72001D Fracture of unspecified part of neck of right femur, subsequent encounter for closed fracture with routine healing: Secondary | ICD-10-CM | POA: Diagnosis not present

## 2018-07-23 DIAGNOSIS — Z825 Family history of asthma and other chronic lower respiratory diseases: Secondary | ICD-10-CM

## 2018-07-23 DIAGNOSIS — Z87891 Personal history of nicotine dependence: Secondary | ICD-10-CM

## 2018-07-23 DIAGNOSIS — Z9981 Dependence on supplemental oxygen: Secondary | ICD-10-CM | POA: Diagnosis not present

## 2018-07-23 DIAGNOSIS — M96841 Postprocedural hematoma of a musculoskeletal structure following other procedure: Secondary | ICD-10-CM | POA: Diagnosis not present

## 2018-07-23 DIAGNOSIS — Z743 Need for continuous supervision: Secondary | ICD-10-CM | POA: Diagnosis not present

## 2018-07-23 DIAGNOSIS — Z9181 History of falling: Secondary | ICD-10-CM | POA: Diagnosis not present

## 2018-07-23 HISTORY — DX: Dependence on supplemental oxygen: Z99.81

## 2018-07-23 LAB — BASIC METABOLIC PANEL
ANION GAP: 7 (ref 5–15)
Anion gap: 5 (ref 5–15)
BUN: 19 mg/dL (ref 8–23)
BUN: 20 mg/dL (ref 8–23)
CHLORIDE: 104 mmol/L (ref 98–111)
CHLORIDE: 103 mmol/L (ref 98–111)
CO2: 33 mmol/L — AB (ref 22–32)
CO2: 30 mmol/L (ref 22–32)
CREATININE: 0.88 mg/dL (ref 0.44–1.00)
Calcium: 8.9 mg/dL (ref 8.9–10.3)
Calcium: 8.7 mg/dL — ABNORMAL LOW (ref 8.9–10.3)
Creatinine, Ser: 1 mg/dL (ref 0.44–1.00)
GFR calc Af Amer: >60 mL/min (ref >60)
GFR calc non Af Amer: 57 mL/min — ABNORMAL LOW (ref >60)
GFR calc non Af Amer: 48 mL/min — ABNORMAL LOW (ref >60)
GFR, EST AFRICAN AMERICAN: 56 mL/min — AB (ref >60)
GLUCOSE: 125 mg/dL — AB (ref 70–99)
Glucose, Bld: 92 mg/dL (ref 70–99)
POTASSIUM: 4.7 mmol/L (ref 3.5–5.1)
Potassium: 4.8 mmol/L (ref 3.5–5.1)
SODIUM: 142 mmol/L (ref 135–145)
Sodium: 140 mmol/L (ref 135–145)

## 2018-07-23 LAB — CBC
HCT: 21.6 % — ABNORMAL LOW (ref 36.0–46.0)
HEMATOCRIT: 27.5 % — AB (ref 36.0–46.0)
HEMOGLOBIN: 6.9 g/dL — AB (ref 12.0–15.0)
Hemoglobin: 9 g/dL — ABNORMAL LOW (ref 12.0–15.0)
MCH: 30.8 pg (ref 26.0–34.0)
MCH: 30.8 pg (ref 26.0–34.0)
MCHC: 31.9 g/dL (ref 30.0–36.0)
MCHC: 32.7 g/dL (ref 30.0–36.0)
MCV: 96.4 fL (ref 78.0–100.0)
MCV: 94.2 fL (ref 78.0–100.0)
PLATELETS: 142 10*3/uL — AB (ref 150–400)
Platelets: 180 10*3/uL (ref 150–400)
RBC: 2.24 MIL/uL — ABNORMAL LOW (ref 3.87–5.11)
RBC: 2.92 MIL/uL — AB (ref 3.87–5.11)
RDW: 14.8 % (ref 11.5–15.5)
RDW: 14.6 % (ref 11.5–15.5)
WBC: 11.3 10*3/uL — ABNORMAL HIGH (ref 4.0–10.5)
WBC: 13.9 10*3/uL — AB (ref 4.0–10.5)

## 2018-07-23 LAB — CBC WITH DIFFERENTIAL/PLATELET
BASOS ABS: 0 10*3/uL (ref 0.0–0.1)
Basophils Relative: 0 %
EOS ABS: 0 10*3/uL (ref 0.0–0.7)
Eosinophils Relative: 0 %
HCT: 20.9 % — ABNORMAL LOW (ref 36.0–46.0)
HEMOGLOBIN: 6.7 g/dL — AB (ref 12.0–15.0)
LYMPHS PCT: 6 %
Lymphs Abs: 0.7 10*3/uL (ref 0.7–4.0)
MCH: 31 pg (ref 26.0–34.0)
MCHC: 32.1 g/dL (ref 30.0–36.0)
MCV: 96.8 fL (ref 78.0–100.0)
MONO ABS: 2 10*3/uL — AB (ref 0.1–1.0)
MONOS PCT: 16 %
NEUTROS ABS: 9.2 10*3/uL — AB (ref 1.7–7.7)
NEUTROS PCT: 78 %
Platelets: 175 10*3/uL (ref 150–400)
RBC: 2.16 MIL/uL — ABNORMAL LOW (ref 3.87–5.11)
RDW: 14.8 % (ref 11.5–15.5)
WBC: 12 10*3/uL — ABNORMAL HIGH (ref 4.0–10.5)

## 2018-07-23 LAB — I-STAT CHEM 8, ED
BUN: 20 mg/dL (ref 8–23)
CREATININE: 1.1 mg/dL — AB (ref 0.44–1.00)
Calcium, Ion: 1.24 mmol/L (ref 1.15–1.40)
Chloride: 100 mmol/L (ref 98–111)
GLUCOSE: 120 mg/dL — AB (ref 70–99)
HCT: 17 % — ABNORMAL LOW (ref 36.0–46.0)
Hemoglobin: 5.8 g/dL — CL (ref 12.0–15.0)
Potassium: 4.6 mmol/L (ref 3.5–5.1)
SODIUM: 139 mmol/L (ref 135–145)
TCO2: 28 mmol/L (ref 22–32)

## 2018-07-23 LAB — PROTIME-INR
INR: 4.22
INR: 4.78
PROTHROMBIN TIME: 40.3 s — AB (ref 11.4–15.2)
PROTHROMBIN TIME: 44.5 s — AB (ref 11.4–15.2)

## 2018-07-23 LAB — ABO/RH: ABO/RH(D): O NEG

## 2018-07-23 LAB — PTH, INTACT AND CALCIUM
Calcium, Total (PTH): 8.4 mg/dL — ABNORMAL LOW (ref 8.7–10.3)
PTH: 40 pg/mL (ref 15–65)

## 2018-07-23 LAB — PREPARE RBC (CROSSMATCH)

## 2018-07-23 MED ORDER — ONDANSETRON HCL 4 MG/2ML IJ SOLN: 4.0000 mg | Freq: Four times a day (QID) | INTRAMUSCULAR | Status: DC | PRN

## 2018-07-23 MED ORDER — ATORVASTATIN CALCIUM 40 MG PO TABS
40.0000 mg | ORAL_TABLET | Freq: Every day | ORAL | Status: DC
  Administered 2018-07-23 – 2018-07-31 (×9): 40 mg via ORAL
  Filled 2018-07-23 (×9): qty 1

## 2018-07-23 MED ORDER — POLYETHYLENE GLYCOL 3350 17 G PO PACK
17.0000 g | PACK | Freq: Every day | ORAL | Status: DC
  Administered 2018-07-23 – 2018-08-01 (×10): 17 g via ORAL
  Filled 2018-07-23 (×10): qty 1

## 2018-07-23 MED ORDER — DOCUSATE SODIUM 100 MG PO CAPS
200.0000 mg | ORAL_CAPSULE | Freq: Every day | ORAL | Status: DC
  Administered 2018-07-23 – 2018-07-31 (×9): 200 mg via ORAL
  Filled 2018-07-23 (×9): qty 2

## 2018-07-23 MED ORDER — ALPRAZOLAM 0.25 MG PO TABS
0.2500 mg | ORAL_TABLET | Freq: Every evening | ORAL | Status: DC | PRN
  Administered 2018-07-23: 0.25 mg via ORAL
  Filled 2018-07-23: qty 1

## 2018-07-23 MED ORDER — SODIUM CHLORIDE 0.9 % IV SOLN
10.0000 mL/h | Freq: Once | INTRAVENOUS | Status: AC
  Administered 2018-07-23: 10 mL/h via INTRAVENOUS

## 2018-07-23 MED ORDER — HYDROCODONE-ACETAMINOPHEN 5-325 MG PO TABS
ORAL_TABLET | ORAL | Status: AC
  Filled 2018-07-23: qty 1

## 2018-07-23 MED ORDER — SODIUM CHLORIDE 0.9% FLUSH
3.0000 mL | Freq: Two times a day (BID) | INTRAVENOUS | Status: DC
  Administered 2018-07-23 – 2018-08-01 (×17): 3 mL via INTRAVENOUS

## 2018-07-23 MED ORDER — UMECLIDINIUM-VILANTEROL 62.5-25 MCG/INH IN AEPB
1.0000 | INHALATION_SPRAY | Freq: Every day | RESPIRATORY_TRACT | Status: DC
  Administered 2018-07-24 – 2018-08-01 (×8): 1 via RESPIRATORY_TRACT
  Filled 2018-07-23 (×3): qty 14

## 2018-07-23 MED ORDER — ACETAMINOPHEN 325 MG PO TABS: 650.0000 mg | ORAL_TABLET | Freq: Four times a day (QID) | ORAL | Status: DC | PRN

## 2018-07-23 MED ORDER — LEVALBUTEROL HCL 0.63 MG/3ML IN NEBU: 0.6300 mg | INHALATION_SOLUTION | Freq: Three times a day (TID) | RESPIRATORY_TRACT | Status: DC | PRN

## 2018-07-23 MED ORDER — ONDANSETRON HCL 4 MG PO TABS: 4.0000 mg | ORAL_TABLET | Freq: Four times a day (QID) | ORAL | Status: DC | PRN

## 2018-07-23 MED ORDER — PREGABALIN 75 MG PO CAPS
75.0000 mg | ORAL_CAPSULE | ORAL | Status: DC
  Administered 2018-07-24 – 2018-07-30 (×4): 75 mg via ORAL
  Filled 2018-07-23 (×5): qty 1

## 2018-07-23 MED ORDER — SODIUM CHLORIDE 0.9 % IV SOLN: 250.0000 mL | INTRAVENOUS | Status: DC | PRN

## 2018-07-23 MED ORDER — IOPAMIDOL (ISOVUE-300) INJECTION 61%
100.0000 mL | Freq: Once | INTRAVENOUS | Status: AC | PRN
  Administered 2018-07-23: 100 mL via INTRAVENOUS

## 2018-07-23 MED ORDER — BENAZEPRIL HCL 40 MG PO TABS
40.0000 mg | ORAL_TABLET | Freq: Every day | ORAL | Status: DC
  Administered 2018-07-23 – 2018-07-24 (×2): 40 mg via ORAL
  Filled 2018-07-23 (×2): qty 1

## 2018-07-23 MED ORDER — DONEPEZIL HCL 10 MG PO TABS
10.0000 mg | ORAL_TABLET | Freq: Every day | ORAL | Status: DC
  Administered 2018-07-23 – 2018-07-31 (×9): 10 mg via ORAL
  Filled 2018-07-23 (×9): qty 1

## 2018-07-23 MED ORDER — ACETAMINOPHEN 650 MG RE SUPP: 650.0000 mg | Freq: Four times a day (QID) | RECTAL | Status: DC | PRN

## 2018-07-23 MED ORDER — MECLIZINE HCL 25 MG PO TABS: 25.0000 mg | ORAL_TABLET | Freq: Three times a day (TID) | ORAL | Status: DC | PRN

## 2018-07-23 MED ORDER — HYDROCODONE-ACETAMINOPHEN 5-325 MG PO TABS
1.0000 | ORAL_TABLET | Freq: Four times a day (QID) | ORAL | Status: DC | PRN
  Administered 2018-07-23 – 2018-07-24 (×4): 2 via ORAL
  Filled 2018-07-23 (×4): qty 2

## 2018-07-23 MED ORDER — HYDROCODONE-ACETAMINOPHEN 5-325 MG PO TABS
1.0000 | ORAL_TABLET | Freq: Once | ORAL | Status: AC
  Administered 2018-07-23: 1 via ORAL

## 2018-07-23 MED ORDER — SODIUM CHLORIDE 0.9% FLUSH: 3.0000 mL | INTRAVENOUS | Status: DC | PRN

## 2018-07-23 NOTE — ED Notes (Signed)
Report given to [facility]. 

## 2018-07-23 NOTE — ED Triage Notes (Signed)
Pt sent from the [facility] center for low Hgb and critical INR.  Pt also has swelling to right thigh with no redness or warmth.

## 2018-07-23 NOTE — Progress Notes (Signed)
Admission note:  Arrival Method: Patient arrived on stretcher from [facility] ER Mental Orientation:  Alert and oriented x 4. Telemetry: 29M-21, NSR Assessment: See doc flow sheets. Skin: stage 1 on the sacrum. IV: Left AC  SL. Pain: 10/20, medication given. Tubes: N/A Safety Measures: bed in low position, call bell and phone within reach.   Fall Prevention Safety Plan: Fall plan reviewed, verbalized understanding. Admission Screening: in progress. 6700 Orientation: Patient has been oriented to the unit, staff and to the room.

## 2018-07-23 NOTE — ED Notes (Signed)
Date and time results received: 07/23/18 11:26 AM  (use smartphrase ".now" to insert current time)  Test: Hgb Critical Value: 6.7  Name of Provider Notified: Clarene Duke  Orders Received? Or Actions Taken?: Orders Received - See Orders for details

## 2018-07-23 NOTE — ED Provider Notes (Signed)
[facility] EMERGENCY DEPARTMENT Provider Note   CSN: [id number] Arrival date & time: 07/23/18  0945     History   Chief Complaint Chief Complaint  Patient presents with  . Abnormal Lab    HPI [name] is a 84 y.o. female.  HPI  Pt was seen at 1015. Per NH report, pt and her family: c/o gradual onset and worsening of persistent right thigh "swelling" for the past 3 days. Pt's family states they noticed the swelling after she was discharged from the hospital and transferred to the [facility] on Friday. Pt has been receiving lovenox and coumadin due to subtherapeutic INR. NH states pt's labs today showed "low hemoglobin" and "elevated INR." Pt was then sent to the ED for further evaluation. Pt denies CP/SOB, no cough, no abd pain, no N/V/D, no back pain, no focal motor weakness, no tingling/numbness in extremities, no rash, no fevers, no new injury.    Past Medical History:  Diagnosis Date  . Carotid artery stenosis    Without infarction  . Chronic combined systolic (congestive) and diastolic (congestive) heart failure (HCC)   . COPD (chronic obstructive pulmonary disease) (HCC)   . Coronary artery disease   . Heart valve replaced by other means   . Hypercholesterolemia    Pure  . Hypertension    Unspecified  . LBBB (left bundle branch block)   . Macular degeneration (senile) of retina, unspecified   . On home O2   . Postsurgical aortocoronary bypass status   . Stroke (HCC)   . Transient global amnesia   . Unspecified hereditary and idiopathic peripheral neuropathy   . Unspecified vitamin D deficiency     Patient Active Problem List   Diagnosis Date Noted  . Protein-calorie malnutrition, severe 07/18/2018  . Hip fracture (HCC) 07/16/2018  . COPD exacerbation (HCC) 07/01/2018  . Right hip pain 07/01/2018  . Chronic respiratory failure with hypoxia, on home O2 therapy (HCC) 07/01/2018  . CHF (congestive heart failure) (HCC) 06/28/2018  . Acute diastolic heart failure  (HCC) 43/15/4008  . Anemia 06/27/2018  . Congestive dilated cardiomyopathy (HCC) 09/29/2015  . Encounter for therapeutic drug monitoring 12/12/2013  . Transient confusion 04/16/2013  . Mild malnutrition (HCC) 11/28/2012  . Dehydration 11/27/2012  . Dementia 11/27/2012  . Pneumonia 11/24/2012  . Aortic valve disorders 01/13/2011  . Carotid artery stenosis 10/13/2009  . S/P AVR (aortic valve replacement) 10/13/2009  . Hyperlipidemia 03/06/2009  . Essential hypertension 03/06/2009  . CAD, ARTERY BYPASS GRAFT 03/06/2009  . LBBB 03/06/2009  . CVA 03/06/2009    Past Surgical History:  Procedure Laterality Date  . AORTIC VALVE REPLACEMENT    . APPENDECTOMY    . CORONARY ARTERY BYPASS GRAFT    . HIP PINNING,CANNULATED Right 07/18/2018   Procedure: RIGHT CANNULATED HIP PINNING;  Surgeon: Myrene Galas, MD;  Location: [facility] OR;  Service: Orthopedics;  Laterality: Right;  . TONSILLECTOMY       OB History   None      Home Medications    Prior to Admission medications   Medication Sig Start Date End Date Taking? Authorizing Provider  ALPRAZolam (XANAX) 0.25 MG tablet Take 1 tablet (0.25 mg total) by mouth at bedtime as needed for anxiety. 07/21/18  Yes Albertine Grates, MD  atorvastatin (LIPITOR) 40 MG tablet Take 1 tablet (40 mg total) by mouth daily. Patient taking differently: Take 40 mg by mouth at bedtime.  02/14/18  Yes Lewayne Bunting, MD  benazepril (LOTENSIN) 40 MG tablet  TAKE (1) TABLET BY MOUTH ONCE DAILY. Patient taking differently: Take 40 mg by mouth daily.  03/07/18  Yes Lewayne Bunting, MD  carvedilol (COREG) 6.25 MG tablet Take 6.25 mg by mouth 2 (two) times daily with a meal.   Yes [provider]  docusate sodium (COLACE) 100 MG capsule Take 200 mg by mouth at bedtime.   Yes [provider]  donepezil (ARICEPT) 10 MG tablet TAKE ONE TABLET BY MOUTH ONCE DAILY. Patient taking differently: Take 10 mg by mouth at bedtime.  08/12/15  Yes Penumalli, Glenford Bayley, MD    ferrous sulfate 325 (65 FE) MG EC tablet Take 325 mg by mouth 2 (two) times daily.   Yes [provider]  HYDROcodone-acetaminophen (NORCO/VICODIN) 5-325 MG tablet Take 1-2 tablets by mouth every 6 (six) hours as needed for moderate pain. 07/21/18  Yes Albertine Grates, MD  Melatonin 3 MG TABS Take 1 tablet (3 mg total) by mouth at bedtime as needed. Patient taking differently: Take 3 mg by mouth at bedtime as needed (sleep).  07/01/18  Yes Kari Baars, MD  polyethylene glycol [facility] Ethelene Hal) packet Take 17 g by mouth daily.   Yes [provider]  potassium chloride (K-DUR,KLOR-CON) 10 MEQ tablet Take 1 tablet (10 mEq total) by mouth 2 (two) times daily. Patient taking differently: Take 10 mEq by mouth daily.  07/01/18  Yes Kari Baars, MD  pregabalin (LYRICA) 75 MG capsule Take 1 capsule (75 mg total) by mouth every other day. Patient taking differently: Take 75 mg by mouth See admin instructions. Take one capsule (75 mg) by mouth every other night 07/10/18  Yes Lassen, Arlo C, PA-C  traMADol (ULTRAM) 50 MG tablet Take 50 mg by mouth every 6 (six) hours as needed.   Yes [provider]  umeclidinium-vilanterol (ANORO ELLIPTA) 62.5-25 MCG/INH AEPB Inhale 1 puff into the lungs daily.    Yes [provider]  warfarin (COUMADIN) 2.5 MG tablet Take 2.5 mg by mouth every evening.    Yes [provider]  acetaminophen (TYLENOL) 500 MG tablet Take 1 tablet (500 mg total) by mouth every 8 (eight) hours. 07/21/18   Montez Morita, PA-C  enoxaparin (LOVENOX) 60 MG/0.6ML injection Inject 0.45 mLs (45 mg total) into the skin daily for 3 days. 07/21/18 07/24/18  Albertine Grates, MD  ergocalciferol (VITAMIN D2) 50000 UNITS capsule Take 50,000 Units by mouth every Tuesday.     [provider]  furosemide (LASIX) 20 MG tablet Take 1 tablet (20 mg total) by mouth daily. 07/24/18   Albertine Grates, MD  hydrALAZINE (APRESOLINE) 10 MG tablet Take 10 mg by mouth daily as needed (SBP >160).      [provider]  levalbuterol Pauline Aus) 0.63 MG/3ML nebulizer solution Take 3 mLs (0.63 mg total) by nebulization every 8 (eight) hours as needed for wheezing or shortness of breath. 07/01/18   Kari Baars, MD  meclizine (ANTIVERT) 25 MG tablet Take 25 mg by mouth 3 (three) times daily as needed for dizziness.    [provider]  ondansetron (ZOFRAN) 4 MG tablet Take 4 mg by mouth every 8 (eight) hours as needed for nausea or vomiting.    [provider]    Family History Family History  Problem Relation Age of Onset  . Heart attack Father   . Stroke Sister   . Neuropathy Brother   . COPD Sister     Social History Social History   Tobacco Use  . Smoking status:  Former Smoker    Packs/day: 0.50    Types: Cigarettes    Last attempt to quit: 11/30/1991    Years since quitting: 26.6  . Smokeless tobacco: Never Used  . Tobacco comment: Tobacco use-no  Substance Use Topics  . Alcohol use: No  . Drug use: No     Allergies   Codeine and Penicillins   Review of Systems Review of Systems ROS: Statement: All systems negative except as marked or noted in the HPI; Constitutional: Negative for fever and chills. ; ; Eyes: Negative for eye pain, redness and discharge. ; ; ENMT: Negative for ear pain, hoarseness, nasal congestion, sinus pressure and sore throat. ; ; Cardiovascular: Negative for chest pain, palpitations, diaphoresis, dyspnea and peripheral edema. ; ; Respiratory: Negative for cough, wheezing and stridor. ; ; Gastrointestinal: Negative for nausea, vomiting, diarrhea, abdominal pain, blood in stool, hematemesis, jaundice and rectal bleeding. . ; ; Genitourinary: Negative for dysuria, flank pain and hematuria. ; ; Musculoskeletal: Negative for back pain and neck pain. +right thigh swelling.; ; Skin: Negative for pruritus, rash, abrasions, blisters, bruising and skin lesion.; ; Neuro: Negative for headache, lightheadedness and neck stiffness. Negative  for weakness, altered level of consciousness, altered mental status, extremity weakness, paresthesias, involuntary movement, seizure and syncope.       Physical Exam Updated Vital Signs BP (!) 141/54   Pulse 71   Temp 98.2 F (36.8 C) (Oral)   Resp 18   Ht 5\' 2"  (1.575 m)   Wt 45.6 kg   SpO2 100%   BMI 18.40 kg/m    Patient Vitals for the past 24 hrs:  BP Temp Temp src Pulse Resp SpO2 Height Weight  07/23/18 1335 (!) 141/54 98.2 F (36.8 C) Oral 71 18 100 % - -  07/23/18 1200 (!) 122/32 - - 70 17 99 % - -  07/23/18 1145 - - - 66 (!) 24 100 % - -  07/23/18 1130 (!) 121/30 - - 61 17 98 % - -  07/23/18 1115 - - - - (!) 21 - - -  07/23/18 1100 (!) 123/37 - - 69 17 98 % - -  07/23/18 1034 - - - 66 17 100 % - -  07/23/18 1032 (!) 121/36 - - 67 (!) 21 - - -  07/23/18 1000 (!) 119/50 - - 70 19 100 % - -  07/23/18 0950 (!) 136/46 98.2 F (36.8 C) Oral 80 16 100 % - -  07/23/18 0949 - - - - - - 5\' 2"  (1.575 m) 45.6 kg      Physical Exam 1020: Physical examination:  Nursing notes reviewed; Vital signs and O2 SAT reviewed;  Constitutional: Well developed, Well nourished, Well hydrated, In no acute distress; Head:  Normocephalic, atraumatic; Eyes: EOMI, PERRL, No scleral icterus; ENMT: Mouth and pharynx normal, Mucous membranes moist; Neck: Supple, Full range of motion, No lymphadenopathy; Cardiovascular: Regular rate and rhythm, No gallop; Respiratory: Breath sounds clear & equal bilaterally, No wheezes.  Speaking full sentences with ease, Normal respiratory effort/excursion; Chest: Nontender, Movement normal; Abdomen: Soft, Nontender, Nondistended, Normal bowel sounds; Genitourinary: No CVA tenderness; Extremities: Peripheral pulses normal, +DSD right hip, no surrounding erythema. +mild tenderness over firm fluid collection right mid-anterior thigh, localized edema from anterior thigh to knee, no overlying warmth, erythema or ecchymosis. Muscles compartments otherwise soft. +equal and palp  pedal pulses bilat. NMS intact bilat feet, NT active/passive ROM RLE. Feet are warm/dry/good color. NT right knee/ankle/foot.   No calf tenderness,  edema or asymmetry.; Neuro: AA&Ox3, Major CN grossly intact.  Speech clear. No gross focal motor or sensory deficits in extremities.; Skin: Color pale, Warm, Dry.   ED Treatments / Results  Labs (all labs ordered are listed, but only abnormal results are displayed)   EKG None  Radiology   Procedures Procedures (including critical care time)  Medications Ordered in ED Medications  0.9 %  sodium chloride infusion (10 mL/hr Intravenous New Bag/Given 07/23/18 1335)  iopamidol (ISOVUE-300) 61 % injection 100 mL (100 mLs Intravenous Contrast Given 07/23/18 1230)     Initial Impression / Assessment and Plan / ED Course  I have reviewed the triage vital signs and the nursing notes.  Pertinent labs & imaging results that were available during my care of the patient were reviewed by me and considered in my medical decision making (see chart for details).     MDM Reviewed: previous chart, nursing note and vitals Reviewed previous: labs Interpretation: labs, CT scan and ultrasound Total time providing critical care: 30-74 minutes. This excludes time spent performing separately reportable procedures and services. Consults: orthopedics and admitting MD   CRITICAL CARE Performed by: Samuel Jester Total critical care time: 35 minutes Critical care time was exclusive of separately billable procedures and treating other patients. Critical care was necessary to treat or prevent imminent or life-threatening deterioration. Critical care was time spent personally by me on the following activities: development of treatment plan with patient and/or surrogate as well as nursing, discussions with consultants, evaluation of patient's response to treatment, examination of patient, obtaining history from patient or surrogate, ordering and performing  treatments and interventions, ordering and review of laboratory studies, ordering and review of radiographic studies, pulse oximetry and re-evaluation of patient's condition.  ED ECG REPORT   Date: 07/23/2018  Rate: 69  Rhythm: normal sinus rhythm  QRS Axis: normal  Intervals: normal  ST/T Wave abnormalities: normal  Conduction Disutrbances:left bundle branch block  Narrative Interpretation:   Old EKG Reviewed: changes noted; NSR has replaced afib on EKG 07/16/2018; no changes compared to EKG 07/02/2018. I have personally reviewed the EKG tracing and agree with the computerized printout as noted.   Results for orders placed or performed during the hospital encounter of 07/23/18  Basic metabolic panel  Result Value Ref Range   Sodium 140 135 - 145 mmol/L   Potassium 4.7 3.5 - 5.1 mmol/L   Chloride 103 98 - 111 mmol/L   CO2 30 22 - 32 mmol/L   Glucose, Bld 125 (H) 70 - 99 mg/dL   BUN 20 8 - 23 mg/dL   Creatinine, Ser 1.61 0.44 - 1.00 mg/dL   Calcium 8.7 (L) 8.9 - 10.3 mg/dL   GFR calc non Af Amer 48 (L) >60 mL/min   GFR calc Af Amer 56 (L) >60 mL/min   Anion gap 7 5 - 15  CBC with Differential  Result Value Ref Range   WBC 12.0 (H) 4.0 - 10.5 K/uL   RBC 2.16 (L) 3.87 - 5.11 MIL/uL   Hemoglobin 6.7 (LL) 12.0 - 15.0 g/dL   HCT 09.6 (L) 04.5 - 40.9 %   MCV 96.8 78.0 - 100.0 fL   MCH 31.0 26.0 - 34.0 pg   MCHC 32.1 30.0 - 36.0 g/dL   RDW 81.1 91.4 - 78.2 %   Platelets 175 150 - 400 K/uL   Neutrophils Relative % 78 %   Neutro Abs 9.2 (H) 1.7 - 7.7 K/uL   Lymphocytes Relative 6 %  Lymphs Abs 0.7 0.7 - 4.0 K/uL   Monocytes Relative 16 %   Monocytes Absolute 2.0 (H) 0.1 - 1.0 K/uL   Eosinophils Relative 0 %   Eosinophils Absolute 0.0 0.0 - 0.7 K/uL   Basophils Relative 0 %   Basophils Absolute 0.0 0.0 - 0.1 K/uL  Protime-INR  Result Value Ref Range   Prothrombin Time 44.5 (H) 11.4 - 15.2 seconds   INR 4.78 (HH)   I-stat Chem 8, ED  Result Value Ref Range   Sodium 139 135  - 145 mmol/L   Potassium 4.6 3.5 - 5.1 mmol/L   Chloride 100 98 - 111 mmol/L   BUN 20 8 - 23 mg/dL   Creatinine, Ser 1.61 (H) 0.44 - 1.00 mg/dL   Glucose, Bld 096 (H) 70 - 99 mg/dL   Calcium, Ion 0.45 4.09 - 1.40 mmol/L   TCO2 28 22 - 32 mmol/L   Hemoglobin 5.8 (LL) 12.0 - 15.0 g/dL   HCT 81.1 (L) 91.4 - 78.2 %   Comment NOTIFIED PHYSICIAN   Type and screen  Result Value Ref Range   ABO/RH(D) O NEG    Antibody Screen NEG    Sample Expiration 07/26/2018    Unit Number [id number]    Blood Component Type RBC LR PHER1    Unit division 00    Status of Unit ISSUED    Transfusion Status OK TO TRANSFUSE    Crossmatch Result COMPATIBLE    Unit Number [id number]    Blood Component Type RED CELLS,LR    Unit division 00    Status of Unit ALLOCATED    Transfusion Status OK TO TRANSFUSE    Crossmatch Result COMPATIBLE   Prepare RBC  Result Value Ref Range   Order Confirmation      ORDER PROCESSED BY BLOOD BANK Performed at [facility], [address] [place], [place] [address]   ABO/Rh  Result Value Ref Range   ABO/RH(D)      [place] NEG Performed at [facility], [address], [place], [place] [address]   BPAM [name]  Result Value Ref Range   ISSUE DATE / TIME [id number]    Blood Product Unit Number [id number]    PRODUCT CODE O8416S06    Unit Type and Rh 9500    Blood Product Expiration Date 301601093235    Blood Product Unit Number [id number]    Unit Type and Rh 9500    Blood Product Expiration Date [id number]     Ct Femur Right W Wo Contrast Result Date: 07/23/2018 CLINICAL DATA:  Swelling in the right thigh. Recent surgery 07/18/2018 for hip fracture. EXAM: CT OF THE LOWER RIGHT EXTREMITY WITHOUT CONTRAST TECHNIQUE: Multidetector CT imaging of the lower right extremity was performed following the standard protocol before and during bolus administration of intravenous contrast. COMPARISON:  Ultrasound from 07/23/2018 CONTRAST:  ISOVUE-300 IOPAMIDOL (ISOVUE-300)  INJECTION 61% FINDINGS: Bones/Joint/Cartilage Small right knee joint effusion. No hip joint effusion. Three distally threaded screws traverse the subcapital femoral neck fracture. Old right pelvic fractures, healed. Clips are noted in the subcutaneous tissues medial right thigh possibly from prior saphenous vein harvesting. Ligaments Suboptimally assessed by CT. Muscles and Tendons 20.5 by 9.5 by 6.9 cm (volume = 700 cm^3) hematoma in the anterior compartment of the thigh felt to primarily be involving the vastus intermedius. There is effacement of soft tissue planes in the anterior compartment. There is also a small amount of subcutaneous hematoma lateral to the iliotibial band, measuring about 4.9 by  2.9 by 4.2 cm (volume = 31 cm^3). Soft tissues No deep vein thrombosis in the thigh is observed. IMPRESSION: 1. Anterior compartmental hematoma in the thigh, probably in the vastus intermedius muscle, measuring about 700 cubic cm in volume. No active extravasation of contrast into this collection is noted during today's exam. There is displacement of surrounding structures and some effacement of adipose tissue in the anterior compartment. Indeterminate with regard to compartment syndrome but no obvious hypoperfusion of the muscular tissues in the anterior compartment is observed. 2. Small 30 cc hematoma in the subcutaneous tissues lateral to the iliotibial band on image 33/4. 3. Screw fixation of the prior subcapital hip fracture. 4. Small knee joint effusion. 5. Old left pelvic fractures. 6. Electronically Signed   By: Gaylyn Rong M.D.   On: 07/23/2018 13:26     US Venous Img Lower Right (dvt Study) Result Date: 07/23/2018 CLINICAL DATA:  Nausea and vomiting. Right hip edema after fixation of a subcapital femoral neck fracture. EXAM: Right LOWER EXTREMITY VENOUS DOPPLER ULTRASOUND TECHNIQUE: Gray-scale sonography with graded compression, as well as color Doppler and duplex ultrasound were performed to  evaluate the lower extremity deep venous systems from the level of the common femoral vein and including the common femoral, femoral, profunda femoral, popliteal and calf veins including the posterior tibial, peroneal and gastrocnemius veins when visible. The superficial great saphenous vein was also interrogated. Spectral Doppler was utilized to evaluate flow at rest and with distal augmentation maneuvers in the common femoral, femoral and popliteal veins. COMPARISON:  CT of the right hip from 07/16/2018 FINDINGS: Contralateral Common Femoral Vein: Respiratory phasicity is normal and symmetric with the symptomatic side. No evidence of thrombus. Normal compressibility. Common Femoral Vein: No evidence of thrombus. Normal compressibility, respiratory phasicity and response to augmentation. Saphenofemoral Junction: No evidence of thrombus. Normal compressibility and flow on color Doppler imaging. Profunda Femoral Vein: No evidence of thrombus. Normal compressibility and flow on color Doppler imaging. Femoral Vein: No evidence of thrombus. Normal compressibility, respiratory phasicity and response to augmentation. Popliteal Vein: No evidence of thrombus. Normal compressibility, respiratory phasicity and response to augmentation. Calf Veins: No evidence of thrombus. Normal compressibility and flow on color Doppler imaging. Superficial Great Saphenous Vein: No evidence of thrombus. Normal compressibility. Venous Reflux:  None. Other Findings: Complex 17.3 by 8.3 by 8.3 cm (volume = 620 cm^3) mass along the right mid thigh, no definite internal Doppler flow, possible hematoma. IMPRESSION: 1. No sonographic findings of deep vein thrombosis in the right lower extremity. 2. Approximately 620 cubic cm complex lesion in the right mid thigh, possibly a hematoma. By report a CT is planned for further assessment. Electronically Signed   By: Gaylyn Rong M.D.   On: 07/23/2018 12:00       1020:  Multiple family members  in pt's room, and asking others to come in from waiting room. All are either talking on cell phones or texting. All are rapidly demanding "callling the Orthopedist," "transfer her to [facility]," "give her antibiotics," "start blood," "get an ultrasound to find a blood clot in her leg," etc.  ED Charge RN, ED RN, and I all attempted multiple times to set expectations, explain ED care/timeline, as well as explain re: elevated INR and DVT not likely and exam more consistent with "bleeding" (ie: post-op hematoma). Family not satisfied and are continuing to rapid fire demands to ED staff regarding their expectations of dx testing and treatment.   1030:  Family now starting to question  their care after surgery: "they got her out of bed too soon after surgery and that's why she's like this," "they just let her sit in a chair," "they just are letting her walk on it," etc.   1030:  Exam likely hematoma in thigh, doubt compartment syndrome.  T/C returned from Ortho Dr. Everardo Pacific, case discussed, including:  HPI, pertinent PM/SHx, VS/PE, dx testing, ED course and proposed treatment, as well as family demands:  he agrees compartment syndrome not likely, also active bleeding not likely this far out of surgery and even if there is active bleeding it would not be a surgical issue only a medical one (increase H/H and decrease INR).   1100:  PRBC's ordered to transfuse. No change in pt assessment.  1330:  Pt is on coumadin for cardiac valve. INR elevated, but no active bleeding; will not reverse at this time.    1345: ED RN and I in exam room to explain dx and testing with pt and family.  Multiple rapid fire questions continue from multiple family members. New family are appearing at bedside now demanding CXR, cardiac testing. When asked why, they state because pt "has hx of fluid in her lungs." Pt currently denies CP/SOB. No indication for this testing at this time; this explained to family. Family states that pt "is receiving  all that fluid now." Explained IVF is KVO and blood is infusing slowly. Pt's VS, including O2 Sat, are stable. Family still not satisfied and now making demands "to tell the doctor's upstairs" including placing pt on telemetry floor, transfer to [facility], calling her Cardiologist, when is the Ortho dr going to see her, etc. I explained that I would pass their concerns to the inpt MD team.   1400:  T/C returned from [facility] Dr. Kerry Hough, case discussed, including:  HPI, pertinent PM/SHx, VS/PE, dx testing, ED course and treatment:  Agreeable to admit.       Final Clinical Impressions(s) / ED Diagnoses   Final diagnoses:  None    ED Discharge Orders    None       Samuel Jester, DO 07/25/18 1025

## 2018-07-23 NOTE — ED Notes (Signed)
Family very difficult while triaging patient.  Daughter is on phone with someone who keeps asking questions and making suggestions about treatment.  Wants to know why pt is not being transfused right now, wants to know why an ultrasound is not being done for leg swelling.  Attempted to explain pt's INR is elevated and DVT is not a primary concern at this time, but then daughter wanted to know why antibiotics are not being given.  Again advised we do not have enough information at this point to give antibiotics as there are no s/s of infection and pt is afebrile.  Daughter also insistent orthopedics needs to be consulted.  Advised if MD felt this was necessary she would, but this was not a primary concern at this time.  Reiterated primary concern is evaluating where blood loss is and addressing INR.  Verbalized understanding.

## 2018-07-23 NOTE — ED Notes (Signed)
Pt taken to ct 

## 2018-07-23 NOTE — ED Notes (Addendum)
Pt taken to US

## 2018-07-23 NOTE — ED Notes (Signed)
Pt used bed pan x 3 since in ED.

## 2018-07-23 NOTE — ED Notes (Signed)
Date and time results received: 07/23/18 11:44 AM  (use smartphrase ".now" to insert current time)  Test: INR Critical Value: 4.78  Name of Provider Notified: Clarene Duke  Orders Received? Or Actions Taken?: Orders Received - See Orders for details

## 2018-07-23 NOTE — H&P (Signed)
History and Physical    [name] ZOX:[id number] DOB: 1926-07-13 DOA: 07/23/2018  PCP: Kari Baars, MD  Patient coming from: [facility]  I have personally briefly reviewed patient's old medical records in [facility]  Chief Complaint: right leg pain  HPI: [name] is a 84 y.o. 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  Hospital course was complicated by development of postop anemia.  She received 2 units of PRBC.  Patient was discharged to nursing facility.  INR was 1.7 the time of discharge, so she was discharged on Coumadin and Lovenox bridge.  Over the past few days, she started to develop significant swelling in her right lower extremity.  She did not have any fever, cough, shortness of breath.  She had significant pain and swelling in her right thigh.  She denies any new numbness or tingling in her right leg.  Labs were checked at the nursing home and she was found to have a worsening anemia as well as a supratherapeutic INR.  She comes back to the emergency room for evaluation  ED Course: Vitals were noted to be stable.  She is not tachycardic.  Blood pressure stable.  INR is elevated at 4.7.  Hemoglobin is down to 6.7.  Serum chemistry is unrevealing.  CT of the right femur, shows anterior compartmental hematoma, probably in the vastus intermedius muscle, measuring about 700 cm in volume.  No active extravasation of contrast is noted.  Patient was ordered 2 units of PRBC.  She is been referred for admission.  Review of Systems: As per HPI otherwise 10 point review of systems negative.    Past Medical History:  Diagnosis Date  . Carotid artery stenosis    Without infarction  . Chronic combined systolic (congestive) and diastolic (congestive) heart failure (HCC)   . COPD (chronic obstructive pulmonary disease) (HCC)   . Coronary artery disease   . Heart valve replaced by other means   . Hypercholesterolemia    Pure  . Hypertension    Unspecified  . LBBB (left bundle branch block)   . Macular degeneration (senile) of retina, unspecified   . On home O2   . Postsurgical aortocoronary bypass status   . Stroke (HCC)   . Transient global amnesia   . Unspecified hereditary and idiopathic peripheral neuropathy   . Unspecified vitamin D deficiency     Past Surgical History:  Procedure Laterality Date  . AORTIC VALVE REPLACEMENT    . APPENDECTOMY    . CORONARY ARTERY BYPASS GRAFT    . HIP PINNING,CANNULATED Right 07/18/2018   Procedure: RIGHT CANNULATED HIP PINNING;  Surgeon: Myrene Galas, MD;  Location: [facility] OR;  Service: Orthopedics;  Laterality: Right;  . TONSILLECTOMY      Social History:  reports that she quit smoking about 26 years ago. Her smoking use included cigarettes. She smoked 0.50 packs per day. She has never used smokeless tobacco. She reports that she does not drink alcohol or use drugs.  Allergies  Allergen Reactions  . Codeine Nausea And Vomiting  . Penicillins Other (See Comments)    Unknown reaction - listed on MAR Has patient had a PCN reaction causing immediate rash, facial/tongue/throat swelling, SOB or lightheadedness with  hypotension: Unknown Has patient had a PCN reaction causing severe rash involving mucus membranes or skin necrosis: Unknown Has patient had a PCN reaction that required hospitalization: Unknown Has patient had a PCN reaction occurring within the last 10 years: Unknown If all of the above answers are "NO", then may proceed with Cephalosporin use.    Family History  Problem Relation Age of Onset  . Heart attack Father   . Stroke Sister   . Neuropathy Brother   . COPD Sister      Prior to  Admission medications   Medication Sig Start Date End Date Taking? Authorizing Provider  acetaminophen (TYLENOL) 500 MG tablet Take 1 tablet (500 mg total) by mouth every 8 (eight) hours. 07/21/18  Yes Montez Morita, PA-C  ALPRAZolam Prudy Feeler) 0.25 MG tablet Take 1 tablet (0.25 mg total) by mouth at bedtime as needed for anxiety. 07/21/18  Yes Albertine Grates, MD  atorvastatin (LIPITOR) 40 MG tablet Take 1 tablet (40 mg total) by mouth daily. Patient taking differently: Take 40 mg by mouth at bedtime.  02/14/18  Yes Lewayne Bunting, MD  benazepril (LOTENSIN) 40 MG tablet TAKE (1) TABLET BY MOUTH ONCE DAILY. Patient taking differently: Take 40 mg by mouth daily.  03/07/18  Yes Lewayne Bunting, MD  carvedilol (COREG) 6.25 MG tablet Take 6.25 mg by mouth 2 (two) times daily with a meal.   Yes [provider]  docusate sodium (COLACE) 100 MG capsule Take 200 mg by mouth at bedtime.   Yes [provider]  donepezil (ARICEPT) 10 MG tablet TAKE ONE TABLET BY MOUTH ONCE DAILY. Patient taking differently: Take 10 mg by mouth at bedtime.  08/12/15  Yes Penumalli, Glenford Bayley, MD  enoxaparin (LOVENOX) 60 MG/0.6ML injection Inject 0.45 mLs (45 mg total) into the skin daily for 3 days. 07/21/18 07/24/18 Yes Albertine Grates, MD  ergocalciferol (VITAMIN D2) 50000 UNITS capsule Take 50,000 Units by mouth every Tuesday.    Yes [provider]  ferrous sulfate 325 (65 FE) MG EC tablet Take 325 mg by mouth 2 (two) times daily.   Yes [provider]  furosemide (LASIX) 20 MG tablet Take 1 tablet (20 mg total) by mouth daily. 07/24/18  Yes Albertine Grates, MD  hydrALAZINE (APRESOLINE) 10 MG tablet Take 10 mg by mouth daily as needed (SBP >160).    Yes [provider]  HYDROcodone-acetaminophen (NORCO/VICODIN) 5-325 MG tablet Take 1-2 tablets by mouth every 6 (six) hours as needed for moderate pain. 07/21/18  Yes Albertine Grates, MD  levalbuterol Pauline Aus) 0.63 MG/3ML nebulizer solution Take 3 mLs (0.63 mg total) by  nebulization every 8 (eight) hours as needed for wheezing or shortness of breath. 07/01/18  Yes Kari Baars, MD  meclizine (ANTIVERT) 25 MG tablet Take 25 mg by mouth 3 (three) times daily as needed for dizziness.   Yes [provider]  Melatonin 3 MG TABS Take 1 tablet (3 mg total) by mouth at bedtime as needed. Patient taking differently: Take 3 mg by mouth at bedtime as needed (sleep).  07/01/18  Yes Kari Baars, MD  ondansetron (ZOFRAN) 4 MG tablet Take 4 mg by mouth every 8 (eight) hours as needed for nausea or vomiting.   Yes [provider]  polyethylene glycol (MIRALAX / GLYCOLAX) packet Take 17 g by mouth daily.   Yes [provider]  potassium chloride (K-DUR,KLOR-CON) 10 MEQ tablet Take 1 tablet (10 mEq total) by mouth 2 (two) times daily. Patient taking differently: Take  10 mEq by mouth daily.  07/01/18  Yes Kari Baars, MD  pregabalin (LYRICA) 75 MG capsule Take 1 capsule (75 mg total) by mouth every other day. Patient taking differently: Take 75 mg by mouth See admin instructions. Take one capsule (75 mg) by mouth every other night 07/10/18  Yes Lassen, Arlo C, PA-C  traMADol (ULTRAM) 50 MG tablet Take 50 mg by mouth every 6 (six) hours as needed.   Yes [provider]  umeclidinium-vilanterol (ANORO ELLIPTA) 62.5-25 MCG/INH AEPB Inhale 1 puff into the lungs daily.    Yes [provider]  warfarin (COUMADIN) 2.5 MG tablet Take 2.5 mg by mouth every evening.    Yes [provider]    Physical Exam: Vitals:   07/23/18 1500 07/23/18 1507 07/23/18 1515 07/23/18 1522  BP: (!) 152/40 (!) 148/41  (!) 137/36  Pulse: 80 82 84 80  Resp: 20 17 18 17   Temp:  98.1 F (36.7 C)  98.3 F (36.8 C)  TempSrc:  Oral  Oral  SpO2: 100% 100% 100% 100%  Weight:      Height:        Constitutional: NAD, calm, comfortable Eyes: PERRL, lids and conjunctivae normal ENMT: Mucous membranes are moist. Posterior pharynx clear of any exudate  or lesions.Normal dentition.  Neck: normal, supple, no masses, no thyromegaly Respiratory: clear to auscultation bilaterally, no wheezing, no crackles. Normal respiratory effort. No accessory muscle use.  Cardiovascular: Regular rate and rhythm, no murmurs / rubs / gallops. No extremity edema. 2+ pedal pulses. No carotid bruits.  Abdomen: no tenderness, no masses palpated. No hepatosplenomegaly. Bowel sounds positive.  Musculoskeletal: Right thigh with significant edema, warmth Skin: no rashes, lesions, ulcers. No induration Neurologic: CN 2-12 grossly intact. Sensation intact, DTR normal. Strength 5/5 in all 4.  Psychiatric: Normal judgment and insight. Alert and oriented x 3. Normal mood.    Labs on Admission: I have personally reviewed following labs and imaging studies  CBC: Recent Labs  Lab 07/16/18 1617  07/20/18 0422 07/21/18 0441 07/22/18 0830 07/23/18 0840 07/23/18 1050 07/23/18 1051  WBC 6.9   < > 8.5 7.5 9.8 11.3*  --  12.0*  NEUTROABS 4.6  --   --   --  7.6  --   --  9.2*  HGB 8.7*   < > 9.5* 8.9* 7.4* 6.9* 5.8* 6.7*  HCT 28.5*   < > 30.3* 28.4* 23.4* 21.6* 17.0* 20.9*  MCV 97.3   < > 95.0 95.3 94.7 96.4  --  96.8  PLT 283   < > 168 155 195 180  --  175   < > = values in this interval not displayed.   Basic Metabolic Panel: Recent Labs  Lab 07/20/18 0422 07/21/18 0441 07/22/18 0830 07/23/18 0840 07/23/18 1050 07/23/18 1051  NA 141 140 141 142 139 140  K 3.7 3.1* 4.3 4.8 4.6 4.7  CL 106 104 104 104 100 103  CO2 29 29 30  33*  --  30  GLUCOSE 95 96 93 92 120* 125*  BUN 18 20 21 19 20 20   CREATININE 0.97 1.00 0.85 0.88 1.10* 1.00  CALCIUM 8.7* 8.6*  8.4* 8.9 8.9  --  8.7*  MG 1.9 1.9  --   --   --   --   PHOS  --  2.8  --   --   --   --    GFR: Estimated Creatinine Clearance: 27.5 mL/min (by C-G formula based on SCr of 1  mg/dL). Liver Function Tests: No results for input(s): AST, ALT, ALKPHOS, BILITOT, PROT, ALBUMIN in the last 168 hours. No results for  input(s): LIPASE, AMYLASE in the last 168 hours. No results for input(s): AMMONIA in the last 168 hours. Coagulation Profile: Recent Labs  Lab 07/20/18 0422 07/21/18 0441 07/22/18 0830 07/23/18 0840 07/23/18 1051  INR 1.48 1.78 2.74 4.22* 4.78*   Cardiac Enzymes: No results for input(s): CKTOTAL, CKMB, CKMBINDEX, TROPONINI in the last 168 hours. BNP (last 3 results) No results for input(s): PROBNP in the last 8760 hours. HbA1C: No results for input(s): HGBA1C in the last 72 hours. CBG: No results for input(s): GLUCAP in the last 168 hours. Lipid Profile: No results for input(s): CHOL, HDL, LDLCALC, TRIG, CHOLHDL, LDLDIRECT in the last 72 hours. Thyroid Function Tests: Recent Labs    07/21/18 0441  TSH 1.942   Anemia Panel: No results for input(s): VITAMINB12, FOLATE, FERRITIN, TIBC, IRON, RETICCTPCT in the last 72 hours. Urine analysis:    Component Value Date/Time   COLORURINE STRAW (A) 07/02/2018 1214   APPEARANCEUR CLEAR 07/02/2018 1214   LABSPEC 1.009 07/02/2018 1214   PHURINE 5.0 07/02/2018 1214   GLUCOSEU NEGATIVE 07/02/2018 1214   GLUCOSEU NEGATIVE 04/10/2008 1246   HGBUR NEGATIVE 07/02/2018 1214   BILIRUBINUR NEGATIVE 07/02/2018 1214   KETONESUR NEGATIVE 07/02/2018 1214   PROTEINUR NEGATIVE 07/02/2018 1214   UROBILINOGEN 0.2 11/25/2012 0010   NITRITE NEGATIVE 07/02/2018 1214   LEUKOCYTESUR NEGATIVE 07/02/2018 1214    Radiological Exams on Admission: Ct Femur Right W Wo Contrast  Result Date: 07/23/2018 CLINICAL DATA:  Swelling in the right thigh. Recent surgery 07/18/2018 for hip fracture. EXAM: CT OF THE LOWER RIGHT EXTREMITY WITHOUT CONTRAST TECHNIQUE: Multidetector CT imaging of the lower right extremity was performed following the standard protocol before and during bolus administration of intravenous contrast. COMPARISON:  Ultrasound from 07/23/2018 CONTRAST:  ISOVUE-300 IOPAMIDOL (ISOVUE-300) INJECTION 61% FINDINGS: Bones/Joint/Cartilage Small  right knee joint effusion. No hip joint effusion. Three distally threaded screws traverse the subcapital femoral neck fracture. Old right pelvic fractures, healed. Clips are noted in the subcutaneous tissues medial right thigh possibly from prior saphenous vein harvesting. Ligaments Suboptimally assessed by CT. Muscles and Tendons 20.5 by 9.5 by 6.9 cm (volume = 700 cm^3) hematoma in the anterior compartment of the thigh felt to primarily be involving the vastus intermedius. There is effacement of soft tissue planes in the anterior compartment. There is also a small amount of subcutaneous hematoma lateral to the iliotibial band, measuring about 4.9 by 2.9 by 4.2 cm (volume = 31 cm^3). Soft tissues No deep vein thrombosis in the thigh is observed. IMPRESSION: 1. Anterior compartmental hematoma in the thigh, probably in the vastus intermedius muscle, measuring about 700 cubic cm in volume. No active extravasation of contrast into this collection is noted during today's exam. There is displacement of surrounding structures and some effacement of adipose tissue in the anterior compartment. Indeterminate with regard to compartment syndrome but no obvious hypoperfusion of the muscular tissues in the anterior compartment is observed. 2. Small 30 cc hematoma in the subcutaneous tissues lateral to the iliotibial band on image 33/4. 3. Screw fixation of the prior subcapital hip fracture. 4. Small knee joint effusion. 5. Old left pelvic fractures. 6. Electronically Signed   By: Gaylyn Rong M.D.   On: 07/23/2018 13:26   US Venous Img Lower Right (dvt Study)  Result Date: 07/23/2018 CLINICAL DATA:  Nausea and vomiting. Right hip edema after fixation of  a subcapital femoral neck fracture. EXAM: Right LOWER EXTREMITY VENOUS DOPPLER ULTRASOUND TECHNIQUE: Gray-scale sonography with graded compression, as well as color Doppler and duplex ultrasound were performed to evaluate the lower extremity deep venous systems from the  level of the common femoral vein and including the common femoral, femoral, profunda femoral, popliteal and calf veins including the posterior tibial, peroneal and gastrocnemius veins when visible. The superficial great saphenous vein was also interrogated. Spectral Doppler was utilized to evaluate flow at rest and with distal augmentation maneuvers in the common femoral, femoral and popliteal veins. COMPARISON:  CT of the right hip from 07/16/2018 FINDINGS: Contralateral Common Femoral Vein: Respiratory phasicity is normal and symmetric with the symptomatic side. No evidence of thrombus. Normal compressibility. Common Femoral Vein: No evidence of thrombus. Normal compressibility, respiratory phasicity and response to augmentation. Saphenofemoral Junction: No evidence of thrombus. Normal compressibility and flow on color Doppler imaging. Profunda Femoral Vein: No evidence of thrombus. Normal compressibility and flow on color Doppler imaging. Femoral Vein: No evidence of thrombus. Normal compressibility, respiratory phasicity and response to augmentation. Popliteal Vein: No evidence of thrombus. Normal compressibility, respiratory phasicity and response to augmentation. Calf Veins: No evidence of thrombus. Normal compressibility and flow on color Doppler imaging. Superficial Great Saphenous Vein: No evidence of thrombus. Normal compressibility. Venous Reflux:  None. Other Findings: Complex 17.3 by 8.3 by 8.3 cm (volume = 620 cm^3) mass along the right mid thigh, no definite internal Doppler flow, possible hematoma. IMPRESSION: 1. No sonographic findings of deep vein thrombosis in the right lower extremity. 2. Approximately 620 cubic cm complex lesion in the right mid thigh, possibly a hematoma. By report a CT is planned for further assessment. Electronically Signed   By: Gaylyn Rong M.D.   On: 07/23/2018 12:00    EKG: Independently reviewed.  Sinus rhythm with left bundle branch block  (old)  Assessment/Plan Active Problems:   Hyperlipidemia   Essential hypertension   S/P AVR (aortic valve replacement)   Chronic respiratory failure with hypoxia, on home O2 therapy (HCC)   Hematoma of thigh, right, initial encounter   Chronic combined systolic and diastolic CHF (congestive heart failure) (HCC)   Supratherapeutic INR   Acute blood loss anemia   CKD (chronic kidney disease) stage 3, GFR 30-59 ml/min (HCC)   COPD (chronic obstructive pulmonary disease) (HCC)   Hematoma of right thigh   1. Acute right thigh hematoma.  Patient recently had right hip surgery done on 9/3.  Postoperatively, she was continued on anticoagulation for her mechanical aortic valve.  She has developed significant swelling in her right thigh.  Pedal pulses are strong, and she is neurovascularly intact.  CT scan of the lower extremity confirms large hematoma.  Will hold further anticoagulation.  Treat supportively with PRBC transfusions.  Case reviewed with Dr. Everardo Pacific on-call for orthopedics.  They will follow the patient in consult. 2. Chronic anticoagulation for aortic valve replacement.  Will hold further Lovenox and Coumadin.  Will allow INR to drift down, unless she develops hemodynamic instability or needs further transfusions for PRBCs.  In that situation, can consider small dose of vitamin K. 3. Chronic combined CHF.  Ejection fraction of 35%.  Appears to be compensated at this time.  Monitor volume status and she is receiving PRBC. 4. Chronic respiratory failure with hypoxia, secondary to COPD exacerbation.  No shortness of breath or wheezing at this time.  Continue outpatient management. 5. Acute blood loss anemia.  Hemoglobin down to 6.7.  Will transfuse  2 units of PRBC.  Continue to follow serial hemoglobin. 6. CKD stage III.  Creatinine is currently near baseline.  Continue to monitor. 7. Leukocytosis.  Suspect this is reactive to underlying hematoma.  No signs of infection at this time.  Monitor  for any fevers. 8. Hypertension.  Blood pressure currently stable.  Continue outpatient regimen.  DVT prophylaxis: SCDs  Code Status: DNR  Family Communication: discussed with granddaughter at the bedside  Disposition Plan: Transfer to [facility] for further evaluation Consults called: Orthopedics, Dr. Everardo Pacific Admission status: Inpatient, telemetry  Erick Blinks MD [facility] Pager [phone]  If 7PM-7AM, please contact night-coverage [email or web address] Password [facility]  07/23/2018, 3:42 PM

## 2018-07-24 ENCOUNTER — Encounter (HOSPITAL_COMMUNITY): Payer: Self-pay | Admitting: *Deleted

## 2018-07-24 ENCOUNTER — Other Ambulatory Visit: Payer: Self-pay

## 2018-07-24 ENCOUNTER — Ambulatory Visit: Payer: Self-pay | Admitting: Pharmacist

## 2018-07-24 DIAGNOSIS — J438 Other emphysema: Secondary | ICD-10-CM

## 2018-07-24 LAB — TYPE AND SCREEN
ABO/RH(D): O NEG
Antibody Screen: NEGATIVE
Unit division: 0
Unit division: 0

## 2018-07-24 LAB — CBC
HCT: 27.5 % — ABNORMAL LOW (ref 36.0–46.0)
HEMATOCRIT: 28.1 % — AB (ref 36.0–46.0)
HEMATOCRIT: 24.8 % — AB (ref 36.0–46.0)
HEMOGLOBIN: 8.9 g/dL — AB (ref 12.0–15.0)
HEMOGLOBIN: 7.9 g/dL — AB (ref 12.0–15.0)
Hemoglobin: 8.9 g/dL — ABNORMAL LOW (ref 12.0–15.0)
MCH: 30.6 pg (ref 26.0–34.0)
MCH: 30.4 pg (ref 26.0–34.0)
MCH: 30.3 pg (ref 26.0–34.0)
MCHC: 32.4 g/dL (ref 30.0–36.0)
MCHC: 31.7 g/dL (ref 30.0–36.0)
MCHC: 31.9 g/dL (ref 30.0–36.0)
MCV: 94.5 fL (ref 78.0–100.0)
MCV: 95.9 fL (ref 78.0–100.0)
MCV: 95 fL (ref 78.0–100.0)
Platelets: 147 10*3/uL — ABNORMAL LOW (ref 150–400)
Platelets: 154 10*3/uL (ref 150–400)
Platelets: 155 10*3/uL (ref 150–400)
RBC: 2.91 MIL/uL — AB (ref 3.87–5.11)
RBC: 2.93 MIL/uL — AB (ref 3.87–5.11)
RBC: 2.61 MIL/uL — AB (ref 3.87–5.11)
RDW: 15 % (ref 11.5–15.5)
RDW: 15.1 % (ref 11.5–15.5)
RDW: 14.9 % (ref 11.5–15.5)
WBC: 14.3 10*3/uL — AB (ref 4.0–10.5)
WBC: 14.5 10*3/uL — AB (ref 4.0–10.5)
WBC: 13.1 10*3/uL — ABNORMAL HIGH (ref 4.0–10.5)

## 2018-07-24 LAB — BASIC METABOLIC PANEL
ANION GAP: 7 (ref 5–15)
BUN: 19 mg/dL (ref 8–23)
CALCIUM: 8.6 mg/dL — AB (ref 8.9–10.3)
CHLORIDE: 105 mmol/L (ref 98–111)
CO2: 28 mmol/L (ref 22–32)
Creatinine, Ser: 0.99 mg/dL (ref 0.44–1.00)
GFR calc non Af Amer: 49 mL/min — ABNORMAL LOW (ref >60)
GFR, EST AFRICAN AMERICAN: 57 mL/min — AB (ref >60)
Glucose, Bld: 103 mg/dL — ABNORMAL HIGH (ref 70–99)
POTASSIUM: 4.6 mmol/L (ref 3.5–5.1)
Sodium: 140 mmol/L (ref 135–145)

## 2018-07-24 LAB — BPAM RBC
BLOOD PRODUCT EXPIRATION DATE: 201910042359
Blood Product Expiration Date: 201910052359
ISSUE DATE / TIME: 201909081459
ISSUE DATE / TIME: 201909081330
UNIT TYPE AND RH: 9500
Unit Type and Rh: 9500

## 2018-07-24 LAB — CALCITRIOL (1,25 DI-OH VIT D): Vit D, 1,25-Dihydroxy: 61.4 pg/mL (ref 19.9–79.3)

## 2018-07-24 LAB — PROTIME-INR
INR: 4.64 — AB
Prothrombin Time: 43.4 seconds — ABNORMAL HIGH (ref 11.4–15.2)

## 2018-07-24 MED ORDER — INFLUENZA VAC SPLIT QUAD 0.5 ML IM SUSY
0.5000 mL | PREFILLED_SYRINGE | Freq: Once | INTRAMUSCULAR | Status: AC
  Administered 2018-07-25: 0.5 mL via INTRAMUSCULAR

## 2018-07-24 MED ORDER — CARVEDILOL 6.25 MG PO TABS
6.2500 mg | ORAL_TABLET | Freq: Two times a day (BID) | ORAL | Status: DC
  Administered 2018-07-24 – 2018-08-01 (×17): 6.25 mg via ORAL
  Filled 2018-07-24 (×17): qty 1

## 2018-07-24 MED ORDER — HYDROCODONE-ACETAMINOPHEN 5-325 MG PO TABS
1.0000 | ORAL_TABLET | ORAL | Status: DC | PRN
  Administered 2018-07-24 – 2018-08-01 (×25): 2 via ORAL
  Filled 2018-07-24 (×25): qty 2

## 2018-07-24 NOTE — Consult Note (Signed)
Cardiology Consultation:   Patient ID: [name] MRN: [id number]; DOB: 10/21/1926  Admit date: 07/23/2018 Date of Consult: 07/24/2018  Primary Care Provider: Kari Baars, MD Primary Cardiologist: [name], MD   Patient Profile:   [name] is a 84 y.o. female with a hx of with a hx of CVA, HTN, HLD, CADs/p CABG with mechanical aortic valve replacement on chronic coumadin (1997), chronic combined heart failure (EF 50-55% with grade 1 diastolic dysfunction) and COPD on home O2  who is being seen today for anticoagulation management at the request of Dr.Xu.   History of Present Illness:  00 [name] was admitted 07/16/18 with right hip pain after mechanical fall few weeks ago. She was very functional despite age. Fount to have right hip fracture s/p repair. Treated with IV heparin while admitted. Hospital course was complicated by development of postop anemia.  She received 2 units of PRBC.  Discharge to [facility] on warfarin (INR was 1.7) with Lovenox bridge. Presented to ER yesterday with significant right lower extremity swelling and pain. She was found to be anemic and supra therapeutic INR leading to ER evaluation.   IN ER, INR was elevated at 4.7 with Hgb of 6.7. Transfused blood. CT showed Anterior compartmental hematoma in right  Thigh. Held anticoagulation and admitted for further devaluation. Seen by orthopedic and felt no surgical intervention needed. Hgb improved to 8.9 today. INR 4.64.   Denies chest pain, dyspnea, palpitations, syncope, orthopnea or PND.   Past Medical History:  Diagnosis Date  . Carotid artery stenosis    Without infarction  . Chronic combined systolic (congestive) and diastolic (congestive) heart failure (HCC)   . COPD (chronic obstructive pulmonary disease) (HCC)   . Coronary artery disease   . Heart valve replaced by other means   . Hypercholesterolemia    Pure  . Hypertension    Unspecified  . LBBB (left bundle branch block)    . Macular degeneration (senile) of retina, unspecified   . On home O2   . Postsurgical aortocoronary bypass status   . Stroke (HCC)   . Transient global amnesia   . Unspecified hereditary and idiopathic peripheral neuropathy   . Unspecified vitamin D deficiency     Past Surgical History:  Procedure Laterality Date  . AORTIC VALVE REPLACEMENT    . APPENDECTOMY    . CORONARY ARTERY BYPASS GRAFT    . HIP PINNING,CANNULATED Right 07/18/2018   Procedure: RIGHT CANNULATED HIP PINNING;  Surgeon: Myrene Galas, MD;  Location: [facility] OR;  Service: Orthopedics;  Laterality: Right;  . TONSILLECTOMY      Inpatient Medications: Scheduled Meds: . atorvastatin  40 mg Oral QHS  . benazepril  40 mg Oral Daily  . docusate sodium  200 mg Oral QHS  . donepezil  10 mg Oral QHS  . Influenza vac split quadrivalent PF  0.5 mL Intramuscular Once  . polyethylene glycol  17 g Oral Daily  . pregabalin  75 mg Oral QODAY  . sodium chloride flush  3 mL Intravenous Q12H  . umeclidinium-vilanterol  1 puff Inhalation Daily   Continuous Infusions: . sodium chloride     PRN Meds: sodium chloride, acetaminophen **OR** acetaminophen, ALPRAZolam, HYDROcodone-acetaminophen, levalbuterol, meclizine, ondansetron **OR** ondansetron (ZOFRAN) IV, sodium chloride flush  Allergies:    Allergies  Allergen Reactions  . Codeine Nausea And Vomiting  . Penicillins Other (See Comments)    Unknown reaction - listed on MAR Has patient had a PCN reaction causing immediate rash,  facial/tongue/throat swelling, SOB or lightheadedness with hypotension: Unknown Has patient had a PCN reaction causing severe rash involving mucus membranes or skin necrosis: Unknown Has patient had a PCN reaction that required hospitalization: Unknown Has patient had a PCN reaction occurring within the last 10 years: Unknown If all of the above answers are "NO", then may proceed with Cephalosporin use.    Social History:   Social History    Socioeconomic History  . Marital status: Widowed    Spouse name: Not on file  . Number of children: 2  . Years of education: 12th  . Highest education level: Not on file  Occupational History    Employer: RETIRED  Social Needs  . Financial resource strain: Not on file  . Food insecurity:    Worry: Not on file    Inability: Not on file  . Transportation needs:    Medical: Not on file    Non-medical: Not on file  Tobacco Use  . Smoking status: Former Smoker    Packs/day: 0.50    Types: Cigarettes    Last attempt to quit: 11/30/1991    Years since quitting: 26.6  . Smokeless tobacco: Never Used  . Tobacco comment: Tobacco use-no  Substance and Sexual Activity  . Alcohol use: No  . Drug use: No  . Sexual activity: Never  Lifestyle  . Physical activity:    Days per week: Not on file    Minutes per session: Not on file  . Stress: Not on file  Relationships  . Social connections:    Talks on phone: Not on file    Gets together: Not on file    Attends religious service: Not on file    Active member of club or organization: Not on file    Attends meetings of clubs or organizations: Not on file    Relationship status: Not on file  . Intimate partner violence:    Fear of current or ex partner: Not on file    Emotionally abused: Not on file    Physically abused: Not on file    Forced sexual activity: Not on file  Other Topics Concern  . Not on file  Social History Narrative   Pt lives at home alone.   Caffeine Use: very rarely    Family History:   Family History  Problem Relation Age of Onset  . Heart attack Father   . Stroke Sister   . Neuropathy Brother   . COPD Sister      ROS:  Please see the history of present illness.  All other ROS reviewed and negative.     Physical Exam/Data:   Vitals:   07/23/18 1735 07/23/18 2047 07/24/18 0557 07/24/18 0749  BP: (!) 155/46 (!) 136/55 (!) 132/45 (!) 136/47  Pulse: 74 82 77 84  Resp: 20 18 18 18   Temp: 99.6 F  (37.6 C) 98.9 F (37.2 C) 98.8 F (37.1 C) 98.6 F (37 C)  TempSrc: Oral Oral Oral Oral  SpO2: 99% 100% 99% 100%  Weight:      Height:        Intake/Output Summary (Last 24 hours) at 07/24/2018 1447 Last data filed at 07/24/2018 1300 Gross per 24 hour  Intake 1801 ml  Output 0 ml  Net 1801 ml   Filed Weights   07/23/18 0949 07/23/18 1733  Weight: 45.6 kg 52.4 kg   Body mass index is 21.13 kg/m.  General:  Frail elderly female in no acute distress HEENT: normal  Lymph: no adenopathy Neck: no JVD Endocrine:  No thryomegaly Vascular: No carotid bruits; FA pulses 2+ bilaterally without bruits  Cardiac:  normal S1, S2; RRR; crisp valve Lungs:  clear to auscultation bilaterally, no wheezing, rhonchi or rales  Abd: soft, nontender, no hepatomegaly  Ext: R leg with 2 + swelling  Musculoskeletal:  No deformities Skin: warm and dry  Neuro:  CNs 2-12 intact, no focal abnormalities noted Psych:  Normal affect   EKG:  The EKG was personally reviewed and demonstrates: SR with LBBB   Relevant CV Studies:  Echo 01/26/18 Study Conclusions  - Left ventricle: The cavity size was normal. Wall thickness was   increased in a pattern of mild LVH. Systolic function was normal.   The estimated ejection fraction was in the range of 50% to 55%.   Doppler parameters are consistent with abnormal left ventricular   relaxation (grade 1 diastolic dysfunction). - Aortic valve: A bioprosthesis was present. There was trivial   regurgitation. - Mitral valve: Severely calcified annulus. Valve area by pressure   half-time: 1.82 cm^2. - Left atrium: The atrium was moderately to severely dilated. - Pulmonary arteries: Systolic pressure was mildly to moderately   increased. PA peak pressure: 39 mm Hg (S).  Laboratory Data:  Chemistry Recent Labs  Lab 07/23/18 0840 07/23/18 1050 07/23/18 1051 07/24/18 0553  NA 142 139 140 140  K 4.8 4.6 4.7 4.6  CL 104 100 103 105  CO2 33*  --  30 28    GLUCOSE 92 120* 125* 103*  BUN 19 20 20 19   CREATININE 0.88 1.10* 1.00 0.99  CALCIUM 8.9  --  8.7* 8.6*  GFRNONAA 57*  --  48* 49*  GFRAA >60  --  56* 57*  ANIONGAP 5  --  7 7    No results for input(s): PROT, ALBUMIN, AST, ALT, ALKPHOS, BILITOT in the last 168 hours. Hematology Recent Labs  Lab 07/23/18 2100 07/24/18 0553 07/24/18 0934  WBC 13.9* 14.3* 14.5*  RBC 2.92* 2.91* 2.93*  HGB 9.0* 8.9* 8.9*  HCT 27.5* 27.5* 28.1*  MCV 94.2 94.5 95.9  MCH 30.8 30.6 30.4  MCHC 32.7 32.4 31.7  RDW 14.6 15.0 15.1  PLT 142* 147* 154   Radiology/Studies:  Ct Femur Right W Wo Contrast  Result Date: 07/23/2018 CLINICAL DATA:  Swelling in the right thigh. Recent surgery 07/18/2018 for hip fracture. EXAM: CT OF THE LOWER RIGHT EXTREMITY WITHOUT CONTRAST TECHNIQUE: Multidetector CT imaging of the lower right extremity was performed following the standard protocol before and during bolus administration of intravenous contrast. COMPARISON:  Ultrasound from 07/23/2018 CONTRAST:  ISOVUE-300 IOPAMIDOL (ISOVUE-300) INJECTION 61% FINDINGS: Bones/Joint/Cartilage Small right knee joint effusion. No hip joint effusion. Three distally threaded screws traverse the subcapital femoral neck fracture. Old right pelvic fractures, healed. Clips are noted in the subcutaneous tissues medial right thigh possibly from prior saphenous vein harvesting. Ligaments Suboptimally assessed by CT. Muscles and Tendons 20.5 by 9.5 by 6.9 cm (volume = 700 cm^3) hematoma in the anterior compartment of the thigh felt to primarily be involving the vastus intermedius. There is effacement of soft tissue planes in the anterior compartment. There is also a small amount of subcutaneous hematoma lateral to the iliotibial band, measuring about 4.9 by 2.9 by 4.2 cm (volume = 31 cm^3). Soft tissues No deep vein thrombosis in the thigh is observed. IMPRESSION: 1. Anterior compartmental hematoma in the thigh, probably in the vastus intermedius  muscle, measuring about 700 cubic cm  in volume. No active extravasation of contrast into this collection is noted during today's exam. There is displacement of surrounding structures and some effacement of adipose tissue in the anterior compartment. Indeterminate with regard to compartment syndrome but no obvious hypoperfusion of the muscular tissues in the anterior compartment is observed. 2. Small 30 cc hematoma in the subcutaneous tissues lateral to the iliotibial band on image 33/4. 3. Screw fixation of the prior subcapital hip fracture. 4. Small knee joint effusion. 5. Old left pelvic fractures. 6. Electronically Signed   By: Gaylyn Rong M.D.   On: 07/23/2018 13:26   US Venous Img Lower Right (dvt Study)  Result Date: 07/23/2018 CLINICAL DATA:  Nausea and vomiting. Right hip edema after fixation of a subcapital femoral neck fracture. EXAM: Right LOWER EXTREMITY VENOUS DOPPLER ULTRASOUND TECHNIQUE: Gray-scale sonography with graded compression, as well as color Doppler and duplex ultrasound were performed to evaluate the lower extremity deep venous systems from the level of the common femoral vein and including the common femoral, femoral, profunda femoral, popliteal and calf veins including the posterior tibial, peroneal and gastrocnemius veins when visible. The superficial great saphenous vein was also interrogated. Spectral Doppler was utilized to evaluate flow at rest and with distal augmentation maneuvers in the common femoral, femoral and popliteal veins. COMPARISON:  CT of the right hip from 07/16/2018 FINDINGS: Contralateral Common Femoral Vein: Respiratory phasicity is normal and symmetric with the symptomatic side. No evidence of thrombus. Normal compressibility. Common Femoral Vein: No evidence of thrombus. Normal compressibility, respiratory phasicity and response to augmentation. Saphenofemoral Junction: No evidence of thrombus. Normal compressibility and flow on color Doppler imaging.  Profunda Femoral Vein: No evidence of thrombus. Normal compressibility and flow on color Doppler imaging. Femoral Vein: No evidence of thrombus. Normal compressibility, respiratory phasicity and response to augmentation. Popliteal Vein: No evidence of thrombus. Normal compressibility, respiratory phasicity and response to augmentation. Calf Veins: No evidence of thrombus. Normal compressibility and flow on color Doppler imaging. Superficial Great Saphenous Vein: No evidence of thrombus. Normal compressibility. Venous Reflux:  None. Other Findings: Complex 17.3 by 8.3 by 8.3 cm (volume = 620 cm^3) mass along the right mid thigh, no definite internal Doppler flow, possible hematoma. IMPRESSION: 1. No sonographic findings of deep vein thrombosis in the right lower extremity. 2. Approximately 620 cubic cm complex lesion in the right mid thigh, possibly a hematoma. By report a CT is planned for further assessment. Electronically Signed   By: Gaylyn Rong M.D.   On: 07/23/2018 12:00    Assessment and Plan:   1. R thigh hematoma Occurred while on Lovenox with coumadin post repair of him fracture. INR of 4.64 today. Last dose of coumadin 07/23/18. No planned surgical intervention. Would avoid reversal with vita K. Continue to hold coumadin.   2. S/p Mechanical aortic valve - Coumadin on hold for #1  3. Chronic combined CHF - Euvolemic. Lasix on hold. Watch of fluid overload.   4. Acute anemia due to #1 - S/p 2 units of PRBCs. Hgb improved to 8.9.   For questions or updates, please contact [facility] Please consult [email or web address] for contact info under     Lorelei Pont, PA  07/24/2018 2:47 PM

## 2018-07-24 NOTE — Evaluation (Signed)
Physical Therapy Evaluation Patient Details Name: [name] MRN: [id number] DOB: 06/30/1926 Today's Date: 07/24/2018   History of Present Illness  84yo female admitted from rehab with significant pain and swelling in her right thigh.  Labs were checked at the nursing home and she was found to have a worsening anemia as well as a supratherapeutic INR. She received R cannulated hip pinning on 07/18/18. PMH CHF, COPD, HTN, L BBB, macular degeneration, CVA, transient global amnesia, hx CABG   Clinical Impression  PTA pt rehabbing at [facility] from R hip pinning. Pt had not progressed ambulation very far before readmission for hematoma formation. Pt is currently mod A for bed mobility, and maxA for transfers and ambulation. Pt is fearful of movement due to increased pain. Pt educated and family educated on need for movement for hematoma reabsorption. Encouraged pt to transfer to [facility] instead of using [facility] and for eating all meals in recliner.  Pt educated on exercises to perform to increase LE blood flow. PT recommends d/c back to [facility] when medically appropriate. PT will continue to follow acutely until d/c.     Follow Up Recommendations SNF    Equipment Recommendations  None recommended by PT    Recommendations for Other Services       Precautions / Restrictions Precautions Precautions: Fall Restrictions Weight Bearing Restrictions: Yes RLE Weight Bearing: Weight bearing as tolerated      Mobility  Bed Mobility Overal bed mobility: Needs Assistance Bed Mobility: Sit to Supine       Sit to supine: Mod assist;HOB elevated   General bed mobility comments: modA for management of R LE, and pad scoot of hips to EoB, vc for hand placement and R LE movement  Transfers Overall transfer level: Needs assistance Equipment used: 1 person hand held assist Transfers: Sit to/from [employer] Sit to Stand: Mod assist Stand pivot transfers: Max assist        General transfer comment: mod A for sit>stand, vc for hand placement to powerup, maxA for stand pivot transfer to [facility] vc for hand placement and LE sequencing, max assist for weight shift to move L LE   Ambulation/Gait Ambulation/Gait assistance: Max assist Gait Distance (Feet): 2 Feet Assistive device: 1 person hand held assist Gait Pattern/deviations: Step-to pattern;Shuffle;Narrow base of support;Decreased weight shift to right;Decreased step length - left;Decreased stance time - right     General Gait Details: max A required for stepping from [facility] to recliner, max verbal and tactile cues for hand placement and sequencing of LE, limited by increased pain with weightshift onto R LE.        Balance Overall balance assessment: Needs assistance Sitting-balance support: Feet supported;Bilateral upper extremity supported Sitting balance-Leahy Scale: Poor       Standing balance-Leahy Scale: Zero                               Pertinent Vitals/Pain Pain Assessment: Faces Faces Pain Scale: Hurts whole lot Pain Location: R LE with weightbearing, hip, swollen thigh and knee Pain Descriptors / Indicators: Aching;Sore;Grimacing;Guarding Pain Intervention(s): Limited activity within patient's tolerance;Monitored during session;Repositioned    Home Living Family/patient expects to be discharged to:: Skilled nursing facility                 Additional Comments: family and patient anticipate return to [facility] rather than straight home     Prior Function Level of Independence: Needs  assistance   Gait / Transfers Assistance Needed: had just started rehab and was not yet ambulating very far  ADL's / Homemaking Assistance Needed: requires assist for ADLs           Extremity/Trunk Assessment   Upper Extremity Assessment Upper Extremity Assessment: Defer to OT evaluation    Lower Extremity Assessment Lower Extremity Assessment: RLE deficits/detail;LLE  deficits/detail RLE Deficits / Details: hip and knee AROM limited by pain  RLE: Unable to fully assess due to pain LLE Deficits / Details: AROM WFL, strength grossly 3+/5    Cervical / Trunk Assessment Cervical / Trunk Assessment: Kyphotic  Communication   Communication: No difficulties  Cognition Arousal/Alertness: Awake/alert Behavior During Therapy: WFL for tasks assessed/performed Overall Cognitive Status: Within Functional Limits for tasks assessed                                        General Comments General comments (skin integrity, edema, etc.): pt on 2L O2 via [place] on entry SaO2 98%O2, with transfers SaO2 on RA maintained at 95%O2, at pt request supplemental O2 returned         Assessment/Plan    PT Assessment Patient needs continued PT services  PT Problem List Decreased strength;Decreased range of motion;Decreased activity tolerance;Decreased balance;Decreased mobility;Decreased coordination;Decreased knowledge of use of DME;Decreased safety awareness;Decreased knowledge of precautions;Pain       PT Treatment Interventions DME instruction;Gait training;Functional mobility training;Therapeutic activities;Therapeutic exercise;Balance training;Cognitive remediation;Patient/family education    PT Goals (Current goals can be found in the Care Plan section)  Acute Rehab PT Goals Patient Stated Goal: go back to [facility] and get stronger  PT Goal Formulation: With patient/family Time For Goal Achievement: 08/07/18 Potential to Achieve Goals: Fair    Frequency Min 2X/week    AM-PAC PT "6 Clicks" Daily Activity  Outcome Measure Difficulty turning over in bed (including adjusting bedclothes, sheets and blankets)?: Unable Difficulty moving from lying on back to sitting on the side of the bed? : Unable Difficulty sitting down on and standing up from a chair with arms (e.g., wheelchair, bedside commode, etc,.)?: Unable Help needed moving to and from a bed  to chair (including a wheelchair)?: Total Help needed walking in hospital room?: Total Help needed climbing 3-5 steps with a railing? : Total 6 Click Score: 6    End of Session Equipment Utilized During Treatment: Oxygen;Gait belt Activity Tolerance: Patient limited by pain Patient left: in chair;with call bell/phone within reach;with chair alarm set;with family/visitor present Nurse Communication: Mobility status PT Visit Diagnosis: Unsteadiness on feet (R26.81);Difficulty in walking, not elsewhere classified (R26.2);Muscle weakness (generalized) (M62.81);History of falling (Z91.81)    Time: 1165-7903 PT Time Calculation (min) (ACUTE ONLY): 32 min   Charges:   PT Evaluation $PT Eval Moderate Complexity: 1 Mod PT Treatments $Therapeutic Activity: 8-22 mins        Ayanni Tun B. Beverely Risen PT, DPT Acute Rehabilitation Services Pager [phone] Office [phone]   Elon Alas Fleet 07/24/2018, 11:45 AM

## 2018-07-24 NOTE — Progress Notes (Signed)
PROGRESS NOTE  [name]:[id number] DOB: [birth year] DOA: 07/23/2018 PCP: Kari Baars, MD  HPI/Recap of past 24 hours:  She denies pain, no edema, no fever  Family at bedside  Assessment/Plan: Active Problems:   Hyperlipidemia   Essential hypertension   S/P AVR (aortic valve replacement)   Chronic respiratory failure with hypoxia, on home O2 therapy (HCC)   Hematoma of thigh, right, initial encounter   Chronic combined systolic and diastolic CHF (congestive heart failure) (HCC)   Supratherapeutic INR   Acute blood loss anemia   CKD (chronic kidney disease) stage 3, GFR 30-59 ml/min (HCC)   COPD (chronic obstructive pulmonary disease) (HCC)   Hematoma of right thigh   Pressure injury of skin  acute anemia/Thigh hematoma in the setting of supertherapeutic INR  -s/p prbc transfusion -ortho consulted, conservative management for  Thigh hematoma  -coumadin held, cardiology consulted for anticoagulation management in the setting of mechanical valve.   R subacute non-displaced hip fracture-  S/p RIGHT CANNULATED HIP PINNING On 9/3 with cardiology for preop eval postop weightbearing status, wound care, pain management per ortho  Leukocytosis: From stress? Dehydration? No fever, she does not appear septic Hold lasix monitor   CAD status post mechanical aortic valve placement. - chronic combined systolic/diastolic congestive  euvolemic Coumadin held due to supertherapeutic INR Cardiology consulted, meds per cardiology  CKDIII  renal function stable at baseline  HTN - Continue Coreg, hold acei for now, resume if bp continue to be stable   HLD - Continue Lipitor  Chronic respiratory failure from COPD, on home O2 4liters at baseline- this is due to underlying COPD, currently compensated, continue home nebulized medications and as needed nebulizer, continue oxygen. No wheezing on exam shortness of breath, no acute issue.  Dementia - Continue  Aricept is at risk for delirium. Minimize narcotics and benzodiazepines.  Severe malnutrition in context of chronic illness, Underweight nutrition input appreciated   Code Status: DNR  Family Communication: patient and family at bedside  Disposition Plan: snf once medically stable, need cardiology/ortho clearance   Consultants:  Cardiology  Ortho Dr Carola Frost  Procedures:  none  Antibiotics:  none   Objective: BP (!) 136/47 (BP Location: Right Arm)   Pulse 84   Temp 98.6 F (37 C) (Oral)   Resp 18   Ht 5\' 2"  (1.575 m)   Wt 52.4 kg   SpO2 100%   BMI 21.13 kg/m   Intake/Output Summary (Last 24 hours) at 07/24/2018 1618 Last data filed at 07/24/2018 1300 Gross per 24 hour  Intake 1545 ml  Output 0 ml  Net 1545 ml   Filed Weights   07/23/18 0949 07/23/18 1733  Weight: 45.6 kg 52.4 kg    Exam: Patient is examined daily including today on 07/24/2018, exams remain the same as of yesterday except that has changed    General:  Pleasant, NAD , frail,   Cardiovascular: RRR  Respiratory: CTABL  Abdomen: Soft/ND/NT, positive BS  Musculoskeletal: No Edema, right thigh hematoma  Neuro: alert, oriented to person with baseline dementia  Data Reviewed: Basic Metabolic Panel: Recent Labs  Lab 07/20/18 0422 07/21/18 0441 07/22/18 0830 07/23/18 0840 07/23/18 1050 07/23/18 1051 07/24/18 0553  NA 141 140 141 142 139 140 140  K 3.7 3.1* 4.3 4.8 4.6 4.7 4.6  CL 106 104 104 104 100 103 105  CO2 29 29 30  33*  --  30 28  GLUCOSE 95 96 93 92 120* 125* 103*  BUN 18 20  21 19 20 20 19   CREATININE 0.97 1.00 0.85 0.88 1.10* 1.00 0.99  CALCIUM 8.7* 8.6*  8.4* 8.9 8.9  --  8.7* 8.6*  MG 1.9 1.9  --   --   --   --   --   PHOS  --  2.8  --   --   --   --   --    Liver Function Tests: No results for input(s): AST, ALT, ALKPHOS, BILITOT, PROT, ALBUMIN in the last 168 hours. No results for input(s): LIPASE, AMYLASE in the last 168 hours. No results for input(s): AMMONIA  in the last 168 hours. CBC: Recent Labs  Lab 07/22/18 0830 07/23/18 0840 07/23/18 1050 07/23/18 1051 07/23/18 2100 07/24/18 0553 07/24/18 0934  WBC 9.8 11.3*  --  12.0* 13.9* 14.3* 14.5*  NEUTROABS 7.6  --   --  9.2*  --   --   --   HGB 7.4* 6.9* 5.8* 6.7* 9.0* 8.9* 8.9*  HCT 23.4* 21.6* 17.0* 20.9* 27.5* 27.5* 28.1*  MCV 94.7 96.4  --  96.8 94.2 94.5 95.9  PLT 195 180  --  175 142* 147* 154   Cardiac Enzymes:   No results for input(s): CKTOTAL, CKMB, CKMBINDEX, TROPONINI in the last 168 hours. BNP (last 3 results) Recent Labs    06/26/18 1935  BNP 1,329.0*    ProBNP (last 3 results) No results for input(s): PROBNP in the last 8760 hours.  CBG: No results for input(s): GLUCAP in the last 168 hours.  Recent Results (from the past 240 hour(s))  Surgical pcr screen     Status: None   Collection Time: 07/17/18  3:57 AM  Result Value Ref Range Status   MRSA, PCR NEGATIVE NEGATIVE Final   Staphylococcus aureus NEGATIVE NEGATIVE Final    Comment: (NOTE) The Xpert SA Assay (FDA approved for NASAL specimens in patients 94 years of age and older), is one component of a comprehensive surveillance program. It is not intended to diagnose infection nor to guide or monitor treatment. Performed at [facility], [address], [place], [place] [address]      Studies: No results found.  Scheduled Meds: . atorvastatin  40 mg Oral QHS  . benazepril  40 mg Oral Daily  . docusate sodium  200 mg Oral QHS  . donepezil  10 mg Oral QHS  . Influenza vac split quadrivalent PF  0.5 mL Intramuscular Once  . polyethylene glycol  17 g Oral Daily  . pregabalin  75 mg Oral QODAY  . sodium chloride flush  3 mL Intravenous Q12H  . umeclidinium-vilanterol  1 puff Inhalation Daily    Continuous Infusions: . sodium chloride       Time spent: 35 mins, case discussed with cardiology and ortho Dr Carola Frost I have personally reviewed and interpreted on  07/24/2018 daily labs, tele  strips, imagings as discussed above under date review session and assessment and plans.  I reviewed all nursing notes, pharmacy notes, consultant notes,  vitals, pertinent old records  I have discussed plan of care as described above with RN , patient and family on 07/24/2018   Albertine Grates MD, PhD  [facility] Pager [phone]. If 7PM-7AM, please contact night-coverage at [email or web address], password [facility] 07/24/2018, 4:18 PM  LOS: 1 day

## 2018-07-24 NOTE — Consult Note (Signed)
   [facility] CM Inpatient Consult   07/24/2018  [name] [birth year] [id number]   Patient assessed for readmission high risk score from [facility].  Patient is currently active with [facility] for chronic disease management services.  Patient as active with Child psychotherapist.  Our community based plan of care has focused on transitional care needs and community resource support.  Patient currently in care will continue to follow.  Of note, [facility] Care Management services does not replace or interfere with any services that are needed or arranged by inpatient case management or social work.  For additional questions or referrals please contact:   Charlesetta Shanks, RN BSN CCM [facility]  [phone] business mobile phone Toll free office [phone]

## 2018-07-24 NOTE — Progress Notes (Addendum)
Orthopedic Trauma Service Progress Note   Patient ID: [name] MRN: [id number] DOB/AGE: 1926-03-22 84 y.o.  Subjective:  Pt well known to ortho trauma service  S/p percutaneous pinning of R femoral neck fracture on 07/18/2018  Patient on chronic anticoagulation for mechanical heart valve.  Coumadin restarted on postoperative day #1.  Patient discharged to skilled nursing center on 07/21/2018.  She presented back to the any pain emergency department with increased right leg swelling in her thigh as well as a supratherapeutic INR of 4.78 and a low hemoglobin hematocrit.  Initial H&H was 5.8 and 17.0, repeat was 6.7 and 20.9.  Patient was transfused at the emergency department.  Ultrasound was performed of her right leg which did not demonstrate a DVT.  CT was also performed which demonstrated a large hematoma in her anterior compartment of her thigh (vastus intermedius).  Small hematoma also noted lateral to the IT band.  Patient transferred to [facility] hospital for further evaluation  Patient remains supratherapeutic with respect to her INR.  This morning her INR is 4.46.  H&H are improved.  Family and patient state that her right thigh swelling is improved since yesterday Pain is well controlled Patient denies any numbness or tingling in her right leg   I did review the provider note from the SNF for orthopedic context.  It is noted that she was touchdown weightbearing at the nursing home.  However, I wrote patient's discharge instructions from her last admission which clearly state weight-bear as tolerated, no range of motion restrictions.  Will make this clear again once patient is discharged.   ROS As above  Objective:   VITALS:   Vitals:   07/23/18 1735 07/23/18 2047 07/24/18 0557 07/24/18 0749  BP: (!) 155/46 (!) 136/55 (!) 132/45 (!) 136/47  Pulse: 74 82 77 84  Resp: 20 18 18 18   Temp: 99.6 F (37.6 C) 98.9 F (37.2 C) 98.8 F (37.1 C) 98.6 F (37  C)  TempSrc: Oral Oral Oral Oral  SpO2: 99% 100% 99% 100%  Weight:      Height:        Estimated body mass index is 21.13 kg/m as calculated from the following:   Height as of this encounter: 5\' 2"  (1.575 m).   Weight as of this encounter: 52.4 kg.   Intake/Output      09/08 0701 - 09/09 0700 09/09 0701 - 09/10 0700   P.O. 240    I.V. (mL/kg) 0 (0)    Blood 847    Total Intake(mL/kg) 1087 (20.7)    Urine (mL/kg/hr) 0    Emesis/NG output 0    Other 0    Stool 0    Blood 0    Total Output 0    Net +1087         Urine Occurrence 0 x    Stool Occurrence 0 x    Emesis Occurrence 0 x      LABS  Results for orders placed or performed during the hospital encounter of 07/23/18 (from the past 24 hour(s))  I-stat Chem 8, ED     Status: Abnormal   Collection Time: 07/23/18 10:50 AM  Result Value Ref Range   Sodium 139 135 - 145 mmol/L   Potassium 4.6 3.5 - 5.1 mmol/L   Chloride 100 98 - 111 mmol/L   BUN 20 8 - 23 mg/dL   Creatinine, Ser 4.09 (H) 0.44 - 1.00 mg/dL   Glucose, Bld 811 (H) 70 - 99  mg/dL   Calcium, Ion 1.21 9.75 - 1.40 mmol/L   TCO2 28 22 - 32 mmol/L   Hemoglobin 5.8 (LL) 12.0 - 15.0 g/dL   HCT 88.3 (L) 25.4 - 98.2 %   Comment NOTIFIED PHYSICIAN   Basic metabolic panel     Status: Abnormal   Collection Time: 07/23/18 10:51 AM  Result Value Ref Range   Sodium 140 135 - 145 mmol/L   Potassium 4.7 3.5 - 5.1 mmol/L   Chloride 103 98 - 111 mmol/L   CO2 30 22 - 32 mmol/L   Glucose, Bld 125 (H) 70 - 99 mg/dL   BUN 20 8 - 23 mg/dL   Creatinine, Ser 6.41 0.44 - 1.00 mg/dL   Calcium 8.7 (L) 8.9 - 10.3 mg/dL   GFR calc non Af Amer 48 (L) >60 mL/min   GFR calc Af Amer 56 (L) >60 mL/min   Anion gap 7 5 - 15  CBC with Differential     Status: Abnormal   Collection Time: 07/23/18 10:51 AM  Result Value Ref Range   WBC 12.0 (H) 4.0 - 10.5 K/uL   RBC 2.16 (L) 3.87 - 5.11 MIL/uL   Hemoglobin 6.7 (LL) 12.0 - 15.0 g/dL   HCT 58.3 (L) 09.4 - 07.6 %   MCV 96.8 78.0 -  100.0 fL   MCH 31.0 26.0 - 34.0 pg   MCHC 32.1 30.0 - 36.0 g/dL   RDW 80.8 81.1 - 03.1 %   Platelets 175 150 - 400 K/uL   Neutrophils Relative % 78 %   Neutro Abs 9.2 (H) 1.7 - 7.7 K/uL   Lymphocytes Relative 6 %   Lymphs Abs 0.7 0.7 - 4.0 K/uL   Monocytes Relative 16 %   Monocytes Absolute 2.0 (H) 0.1 - 1.0 K/uL   Eosinophils Relative 0 %   Eosinophils Absolute 0.0 0.0 - 0.7 K/uL   Basophils Relative 0 %   Basophils Absolute 0.0 0.0 - 0.1 K/uL  Protime-INR     Status: Abnormal   Collection Time: 07/23/18 10:51 AM  Result Value Ref Range   Prothrombin Time 44.5 (H) 11.4 - 15.2 seconds   INR 4.78 (HH)   ABO/Rh     Status: None   Collection Time: 07/23/18 10:51 AM  Result Value Ref Range   ABO/RH(D)      O NEG Performed at [facility], [address], [place], [place] [address]   Type and screen     Status: None   Collection Time: 07/23/18 11:03 AM  Result Value Ref Range   ABO/RH(D) O NEG    Antibody Screen NEG    Sample Expiration 07/26/2018    Unit Number [id number]    Blood Component Type RBC LR PHER1    Unit division 00    Status of Unit ISSUED,FINAL    Transfusion Status OK TO TRANSFUSE    Crossmatch Result COMPATIBLE    Unit Number [id number]    Blood Component Type RED CELLS,LR    Unit division 00    Status of Unit ISSUED,FINAL    Transfusion Status OK TO TRANSFUSE    Crossmatch Result COMPATIBLE   Prepare RBC     Status: None   Collection Time: 07/23/18 11:10 AM  Result Value Ref Range   Order Confirmation      ORDER PROCESSED BY BLOOD BANK Performed at [facility], [address] [place], [place] [address]   CBC     Status: Abnormal   Collection Time: 07/23/18  9:00 PM  Result Value Ref Range   WBC 13.9 (H) 4.0 - 10.5 K/uL   RBC 2.92 (L) 3.87 - 5.11 MIL/uL   Hemoglobin 9.0 (L) 12.0 - 15.0 g/dL   HCT 16.1 (L) 09.6 - 04.5 %   MCV 94.2 78.0 - 100.0 fL   MCH 30.8 26.0 - 34.0 pg   MCHC 32.7 30.0 - 36.0 g/dL   RDW 40.9 81.1 - 91.4 %   Platelets  142 (L) 150 - 400 K/uL  CBC     Status: Abnormal   Collection Time: 07/24/18  5:53 AM  Result Value Ref Range   WBC 14.3 (H) 4.0 - 10.5 K/uL   RBC 2.91 (L) 3.87 - 5.11 MIL/uL   Hemoglobin 8.9 (L) 12.0 - 15.0 g/dL   HCT 78.2 (L) 95.6 - 21.3 %   MCV 94.5 78.0 - 100.0 fL   MCH 30.6 26.0 - 34.0 pg   MCHC 32.4 30.0 - 36.0 g/dL   RDW 08.6 57.8 - 46.9 %   Platelets 147 (L) 150 - 400 K/uL  Basic metabolic panel     Status: Abnormal   Collection Time: 07/24/18  5:53 AM  Result Value Ref Range   Sodium 140 135 - 145 mmol/L   Potassium 4.6 3.5 - 5.1 mmol/L   Chloride 105 98 - 111 mmol/L   CO2 28 22 - 32 mmol/L   Glucose, Bld 103 (H) 70 - 99 mg/dL   BUN 19 8 - 23 mg/dL   Creatinine, Ser 6.29 0.44 - 1.00 mg/dL   Calcium 8.6 (L) 8.9 - 10.3 mg/dL   GFR calc non Af Amer 49 (L) >60 mL/min   GFR calc Af Amer 57 (L) >60 mL/min   Anion gap 7 5 - 15  Protime-INR     Status: Abnormal   Collection Time: 07/24/18  5:53 AM  Result Value Ref Range   Prothrombin Time 43.4 (H) 11.4 - 15.2 seconds   INR 4.64 (HH)      PHYSICAL EXAM:   Gen: Awake and alert, resting comfortably in bed, no acute distress Ext:       Right lower extremity  Lateral hip wound is stable, hematoma present  There is swelling to the right thigh, mild tenderness but no pain out of proportion with evaluation  Soft tissue is not tense             + R knee soft tissue swelling                       Will check xrays            May have hemarthrosis given supratherapeutic INR  DPN, SPN, TN sensory functions intact,  Femoral nerve motor functions grossly intact  EHL, FHL, anterior tibialis, posterior tibialis, peroneals and gastrocsoleus complex motor functions are grossly intact as well  Patient tolerates gentle knee flexion extension, no pain portion  No significant swelling distally  Extremity is warm  + DP pulse  Assessment/Plan:     Active Problems:   Hyperlipidemia   Essential hypertension   S/P AVR (aortic valve  replacement)   Chronic respiratory failure with hypoxia, on home O2 therapy (HCC)   Hematoma of thigh, right, initial encounter   Chronic combined systolic and diastolic CHF (congestive heart failure) (HCC)   Supratherapeutic INR   Acute blood loss anemia   CKD (chronic kidney disease) stage 3, GFR 30-59 ml/min (HCC)   COPD (chronic obstructive pulmonary disease) (  HCC)   Hematoma of right thigh   Pressure injury of skin   Anti-infectives (From admission, onward)   None    .  POD/HD#: 12  84 year old female on chronic anticoagulation for mechanical heart valve s/p percutaneous pinning of subacute right femoral neck fracture on 07/18/2018 admitted for supratherapeutic INR and anterior right thigh compartment hematoma  -Subacute right femoral neck fracture s/p percutaneous pinning on 07/18/2018  Weight-bear as tolerated  No hip or knee range of motion restrictions  PT and OT  -Right thigh anterior compartment hematoma  No role for surgical intervention at this time.  Patient remains supratherapeutic with respect to her INR.  Would make bleeding worse with surgical intervention given her elevated INR  Patient lacking signs and symptoms consistent with evolving compartment syndrome  Would anticipate her hematoma to resolve over time with control of her INR   Ice to right thigh  TED hose to right leg, thigh-high  - ABL anemia/Hemodynamics  Per medical service  - Medical issues   Supratherapeutic INR   Per primary team  - Dispo:  Will follow along  Per medicine    Mearl Latin, PA-C [facility] [phone] Traci Sermon (C) 07/24/2018, 10:01 AM

## 2018-07-25 LAB — CBC
HCT: 23.9 % — ABNORMAL LOW (ref 36.0–46.0)
HEMATOCRIT: 23 % — AB (ref 36.0–46.0)
HEMOGLOBIN: 7.5 g/dL — AB (ref 12.0–15.0)
Hemoglobin: 7.5 g/dL — ABNORMAL LOW (ref 12.0–15.0)
MCH: 31.1 pg (ref 26.0–34.0)
MCH: 30.7 pg (ref 26.0–34.0)
MCHC: 32.6 g/dL (ref 30.0–36.0)
MCHC: 31.4 g/dL (ref 30.0–36.0)
MCV: 95.4 fL (ref 78.0–100.0)
MCV: 98 fL (ref 78.0–100.0)
PLATELETS: 205 10*3/uL (ref 150–400)
Platelets: 164 10*3/uL (ref 150–400)
RBC: 2.41 MIL/uL — AB (ref 3.87–5.11)
RBC: 2.44 MIL/uL — ABNORMAL LOW (ref 3.87–5.11)
RDW: 15 % (ref 11.5–15.5)
RDW: 14.7 % (ref 11.5–15.5)
WBC: 12.8 10*3/uL — ABNORMAL HIGH (ref 4.0–10.5)
WBC: 12 10*3/uL — ABNORMAL HIGH (ref 4.0–10.5)

## 2018-07-25 LAB — PROTIME-INR
INR: 3.43
Prothrombin Time: 34.3 seconds — ABNORMAL HIGH (ref 11.4–15.2)

## 2018-07-25 NOTE — [place] FL2 (Signed)
[place] MEDICAID FL2 LEVEL OF CARE SCREENING TOOL     IDENTIFICATION  Patient Name: [name] Birthdate: 01-04-1926 Sex: female Admission Date (Current Location): 07/23/2018  [facility] and [place] Number:  [employer] and Address:  The [place], [address], [place], [place] [address]      Provider Number: [id number]  Attending Physician Name and Address:  Albertine Grates, MD  Relative Name and Phone Number:  Zenovia Jarred; [phone]    Current Level of Care: Hospital Recommended Level of Care: Skilled Nursing Facility Prior Approval Number:    Date Approved/Denied:   PASRR Number: [id number]  Discharge Plan: SNF    Current Diagnoses: Patient Active Problem List   Diagnosis Date Noted  . Hematoma of thigh, right, initial encounter 07/23/2018  . Chronic combined systolic and diastolic CHF (congestive heart failure) (HCC) 07/23/2018  . Supratherapeutic INR 07/23/2018  . Acute blood loss anemia 07/23/2018  . CKD (chronic kidney disease) stage 3, GFR 30-59 ml/min (HCC) 07/23/2018  . COPD (chronic obstructive pulmonary disease) (HCC) 07/23/2018  . Hematoma of right thigh 07/23/2018  . Pressure injury of skin 07/23/2018  . Protein-calorie malnutrition, severe 07/18/2018  . Hip fracture (HCC) 07/16/2018  . COPD exacerbation (HCC) 07/01/2018  . Right hip pain 07/01/2018  . Chronic respiratory failure with hypoxia, on home O2 therapy (HCC) 07/01/2018  . CHF (congestive heart failure) (HCC) 06/28/2018  . Acute diastolic heart failure (HCC) 06/27/2018  . Anemia 06/27/2018  . Congestive dilated cardiomyopathy (HCC) 09/29/2015  . Encounter for therapeutic drug monitoring 12/12/2013  . Transient confusion 04/16/2013  . Mild malnutrition (HCC) 11/28/2012  . Dehydration 11/27/2012  . Dementia 11/27/2012  . Pneumonia 11/24/2012  . Aortic valve disorders 01/13/2011  . Carotid artery stenosis 10/13/2009  . S/P AVR (aortic valve  replacement) 10/13/2009  . Hyperlipidemia 03/06/2009  . Essential hypertension 03/06/2009  . CAD, ARTERY BYPASS GRAFT 03/06/2009  . LBBB 03/06/2009  . CVA 03/06/2009    Orientation RESPIRATION BLADDER Height & Weight     Self, Time, Place, Situation  O2(Nasal cannula 2L) Continent, External catheter Weight: 52.4 kg Height:  5\' 2"  (157.5 cm)  BEHAVIORAL SYMPTOMS/MOOD NEUROLOGICAL BOWEL NUTRITION STATUS      Continent Diet(Please see DC Summary)  AMBULATORY STATUS COMMUNICATION OF NEEDS Skin   Extensive Assist Verbally Surgical wounds, PU Stage and Appropriate Care(Stage I on ischial tuberosity;Closed incision on hip)                       Personal Care Assistance Level of Assistance    Bathing Assistance: Maximum assistance Feeding assistance: Independent Dressing Assistance: Maximum assistance     Functional Limitations Info  Sight, Hearing, Speech Sight Info: Adequate Hearing Info: Impaired Speech Info: Adequate    SPECIAL CARE FACTORS FREQUENCY  PT (By licensed PT), OT (By licensed OT)     PT Frequency: 5x/week OT Frequency: 3x/week            Contractures Contractures Info: Not present    Additional Factors Info  Code Status, Allergies, Psychotropic Code Status Info: DNR Allergies Info: Codeine, Penicillins Psychotropic Info: Aricept         Current Medications (07/25/2018):  This is the current hospital active medication list Current Facility-Administered Medications  Medication Dose Route Frequency Provider Last Rate Last Dose  . 0.9 %  sodium chloride infusion  250 mL Intravenous PRN Erick Blinks, MD      . acetaminophen (TYLENOL)  tablet 650 mg  650 mg Oral Q6H PRN Erick Blinks, MD       Or  . acetaminophen (TYLENOL) suppository 650 mg  650 mg Rectal Q6H PRN Erick Blinks, MD      . ALPRAZolam Prudy Feeler) tablet 0.25 mg  0.25 mg Oral QHS PRN Erick Blinks, MD   0.25 mg at 07/23/18 2246  . atorvastatin (LIPITOR) tablet 40 mg  40 mg Oral QHS  Erick Blinks, MD   40 mg at 07/24/18 2240  . carvedilol (COREG) tablet 6.25 mg  6.25 mg Oral BID WC Albertine Grates, MD   6.25 mg at 07/25/18 0853  . docusate sodium (COLACE) capsule 200 mg  200 mg Oral QHS Erick Blinks, MD   200 mg at 07/24/18 2240  . donepezil (ARICEPT) tablet 10 mg  10 mg Oral QHS Erick Blinks, MD   10 mg at 07/24/18 2240  . HYDROcodone-acetaminophen (NORCO/VICODIN) 5-325 MG per tablet 1-2 tablet  1-2 tablet Oral Q4H PRN Albertine Grates, MD   2 tablet at 07/25/18 1518  . levalbuterol (XOPENEX) nebulizer solution 0.63 mg  0.63 mg Nebulization Q8H PRN Erick Blinks, MD      . meclizine (ANTIVERT) tablet 25 mg  25 mg Oral TID PRN Erick Blinks, MD      . ondansetron (ZOFRAN) tablet 4 mg  4 mg Oral Q6H PRN Erick Blinks, MD       Or  . ondansetron (ZOFRAN) injection 4 mg  4 mg Intravenous Q6H PRN Memon, Durward Mallard, MD      . polyethylene glycol (MIRALAX / GLYCOLAX) packet 17 g  17 g Oral Daily Erick Blinks, MD   17 g at 07/25/18 1054  . pregabalin (LYRICA) capsule 75 mg  75 mg Oral Alice Reichert, MD   75 mg at 07/24/18 2240  . sodium chloride flush (NS) 0.9 % injection 3 mL  3 mL Intravenous Q12H Erick Blinks, MD   3 mL at 07/25/18 1036  . sodium chloride flush (NS) 0.9 % injection 3 mL  3 mL Intravenous PRN Erick Blinks, MD      . umeclidinium-vilanterol (ANORO ELLIPTA) 62.5-25 MCG/INH 1 puff  1 puff Inhalation Daily Erick Blinks, MD   1 puff at 07/25/18 0805     Discharge Medications: Please see discharge summary for a list of discharge medications.  Relevant Imaging Results:  Relevant Lab Results:   Additional Information SS# [id number] [place], [place]

## 2018-07-25 NOTE — Clinical Social Work Note (Signed)
Clinical Social Work Assessment  Patient Details  Name: [name] MRN: [id number] Date of Birth: 17-Mar-1926  Date of referral:  07/25/18               Reason for consult:  Facility Placement, Discharge Planning                Permission sought to share information with:  Family Supports Permission granted to share information::  Yes, Verbal Permission Granted  Name::     [name] (lives [place], [place]) and [place]  Agency::     Relationship::  Daughters  Contact Information:  [name] [phone]  Housing/Transportation Living arrangements for the past 2 months:  Single Family Home, Skilled Nursing Facility(Patient came to hospital from [facility]. Patient lives alone in a single-family homeher) Source of Information:  Patient, Adult Children(Daughter [name] at the bedside) Patient Interpreter Needed:  None Criminal Activity/Legal Involvement Pertinent to Current Situation/Hospitalization:  No - Comment as needed Significant Relationships:  Adult Children Lives with:  Facility Resident, Self(Currently in SNF and lives alone at her home) Do you feel safe going back to the place where you live?  No(Patient agreeable to returning to [facility] before going home) Need for family participation in patient care:  Yes (Comment)  Care giving concerns:  Patient and daughter did not express any concerns regarding her care at [facility].  Social Worker assessment / plan:  CSW talked with patient and daughter [name] at the bedside regarding her discharge disposition once medically stable. [name] was sitting up in bed and was alert, oriented, very pleasant and agreeable to talking with CSW and CSW intern. CSW provided with history of patient's recent hospitalizations and her stay at [facility]. When asked, [name] responded that she does plan to return to [facility]. CSW advised that her belongings are still there in her room and probably will remain there  unless they need the bed, as she is not paying to hold a bed.  Care at home discussed once she leaves rehab and CSW advised that patient currently has someone who comes 3 days a week to cook for her. Per daughter they plan to extend the care to 24/7 and hope to continue using the same lady and hiring someone else. During the conversation, [name] reported that she is active and has been going to the gym 3 days a week for years and also has a bike at home.    Employment status:  Retired Engineer, mining, [employer] PT Recommendations:  Skilled Nursing Facility Information / Referral to community resources:  Other (Comment Required)(None needed or requested as patient from SNF)  Patient/Family's Response to care: No concerns expressed by patient or daughter regarding care during hospitalization.  Patient/Family's Understanding of and Emotional Response to Diagnosis, Current Treatment, and Prognosis: Daughter and patient aware of her current medical issues and the care she is currently receiving. Daughter provided CSW with information regarding test results, etc.  Emotional Assessment Appearance:  Appears stated age Attitude/Demeanor/Rapport:  Engaged Affect (typically observed):  Appropriate, Pleasant Orientation:  Oriented to Self, Oriented to Place, Oriented to  Time, Oriented to Situation Alcohol / Substance use:  Tobacco Use, Alcohol Use, Illicit Drugs(Patient reported that she quit smoking 26 years ago and does not drink or use illicit drugs) Psych involvement (Current and /or in the community):  No (Comment)  Discharge Needs  Concerns to be addressed:  Discharge Planning Concerns Readmission  within the last 30 days:  Yes Current discharge risk:  None Barriers to Discharge:  Continued Medical Work up   [employer] Lazaro Arms, LCSW 07/25/2018, 4:36 PM

## 2018-07-25 NOTE — Progress Notes (Signed)
Progress Note  Patient Name: [name] Date of Encounter: 07/25/2018  Primary Cardiologist: [name], MD   Subjective   Continues to have pain today, family concerned that hematoma is getting larger. No loss of sensation in the foot.  Inpatient Medications    Scheduled Meds: . atorvastatin  40 mg Oral QHS  . carvedilol  6.25 mg Oral BID WC  . docusate sodium  200 mg Oral QHS  . donepezil  10 mg Oral QHS  . Influenza vac split quadrivalent PF  0.5 mL Intramuscular Once  . polyethylene glycol  17 g Oral Daily  . pregabalin  75 mg Oral QODAY  . sodium chloride flush  3 mL Intravenous Q12H  . umeclidinium-vilanterol  1 puff Inhalation Daily   Continuous Infusions: . sodium chloride     PRN Meds: sodium chloride, acetaminophen **OR** acetaminophen, ALPRAZolam, HYDROcodone-acetaminophen, levalbuterol, meclizine, ondansetron **OR** ondansetron (ZOFRAN) IV, sodium chloride flush   Vital Signs    Vitals:   07/24/18 1712 07/24/18 2012 07/25/18 0645 07/25/18 0806  BP: (!) 142/53 (!) 137/46 (!) 138/55   Pulse: 90 70 92   Resp: 18 18 20    Temp: 98.5 F (36.9 C) 99.3 F (37.4 C) 99.8 F (37.7 C)   TempSrc: Oral Oral Oral   SpO2: 100% 100% 100% 98%  Weight:      Height:        Intake/Output Summary (Last 24 hours) at 07/25/2018 1245 Last data filed at 07/25/2018 0600 Gross per 24 hour  Intake 320 ml  Output 150 ml  Net 170 ml   Filed Weights   07/23/18 0949 07/23/18 1733  Weight: 45.6 kg 52.4 kg    Telemetry    Sinus rhythm - Personally Reviewed  ECG    No new - Personally Reviewed  Physical Exam   GEN: No acute distress.   Neck: supple, no JVD Cardiac: regular S1 and S2, no murmurs, rubs, or gallops. Crisp mechanical valve closure Respiratory: Clear to auscultation bilaterally. GI: Soft, nontender, non-distended. Bowel sounds normal MS: Right thigh with 2+ swelling and tenderness at upper lateral aspect. Warm, palpable pulses distal, sensation  intact Neuro:  Nonfocal, moves all limbs independently Psych: Normal affect   Labs    Chemistry Recent Labs  Lab 07/23/18 0840 07/23/18 1050 07/23/18 1051 07/24/18 0553  NA 142 139 140 140  K 4.8 4.6 4.7 4.6  CL 104 100 103 105  CO2 33*  --  30 28  GLUCOSE 92 120* 125* 103*  BUN 19 20 20 19   CREATININE 0.88 1.10* 1.00 0.99  CALCIUM 8.9  --  8.7* 8.6*  GFRNONAA 57*  --  48* 49*  GFRAA >60  --  56* 57*  ANIONGAP 5  --  7 7     Hematology Recent Labs  Lab 07/24/18 0934 07/24/18 1809 07/25/18 0230  WBC 14.5* 13.1* 12.8*  RBC 2.93* 2.61* 2.41*  HGB 8.9* 7.9* 7.5*  HCT 28.1* 24.8* 23.0*  MCV 95.9 95.0 95.4  MCH 30.4 30.3 31.1  MCHC 31.7 31.9 32.6  RDW 15.1 14.9 15.0  PLT 154 155 164    Cardiac EnzymesNo results for input(s): TROPONINI in the last 168 hours. No results for input(s): TROPIPOC in the last 168 hours.   BNPNo results for input(s): BNP, PROBNP in the last 168 hours.   DDimer No results for input(s): DDIMER in the last 168 hours.   Radiology    Ct Femur Right W Wo Contrast  Result Date:  07/23/2018 CLINICAL DATA:  Swelling in the right thigh. Recent surgery 07/18/2018 for hip fracture. EXAM: CT OF THE LOWER RIGHT EXTREMITY WITHOUT CONTRAST TECHNIQUE: Multidetector CT imaging of the lower right extremity was performed following the standard protocol before and during bolus administration of intravenous contrast. COMPARISON:  Ultrasound from 07/23/2018 CONTRAST:  ISOVUE-300 IOPAMIDOL (ISOVUE-300) INJECTION 61% FINDINGS: Bones/Joint/Cartilage Small right knee joint effusion. No hip joint effusion. Three distally threaded screws traverse the subcapital femoral neck fracture. Old right pelvic fractures, healed. Clips are noted in the subcutaneous tissues medial right thigh possibly from prior saphenous vein harvesting. Ligaments Suboptimally assessed by CT. Muscles and Tendons 20.5 by 9.5 by 6.9 cm (volume = 700 cm^3) hematoma in the anterior compartment of the  thigh felt to primarily be involving the vastus intermedius. There is effacement of soft tissue planes in the anterior compartment. There is also a small amount of subcutaneous hematoma lateral to the iliotibial band, measuring about 4.9 by 2.9 by 4.2 cm (volume = 31 cm^3). Soft tissues No deep vein thrombosis in the thigh is observed. IMPRESSION: 1. Anterior compartmental hematoma in the thigh, probably in the vastus intermedius muscle, measuring about 700 cubic cm in volume. No active extravasation of contrast into this collection is noted during today's exam. There is displacement of surrounding structures and some effacement of adipose tissue in the anterior compartment. Indeterminate with regard to compartment syndrome but no obvious hypoperfusion of the muscular tissues in the anterior compartment is observed. 2. Small 30 cc hematoma in the subcutaneous tissues lateral to the iliotibial band on image 33/4. 3. Screw fixation of the prior subcapital hip fracture. 4. Small knee joint effusion. 5. Old left pelvic fractures. 6. Electronically Signed   By: Gaylyn Rong M.D.   On: 07/23/2018 13:26    Cardiac Studies   none  Patient Profile     84 y.o. female with a hx of with a hx of CVA,HTN,HLD,CADs/p CABG withmechanicalaortic valve replacementon chronic coumadin(1997),chronic combined heart failure (EF 50-55%withgrade 1 diastolic dysfunction) and COPD on home O2 who is being followed for anticoagulation management at the request of Dr.Xu.   Assessment & Plan    1. R thigh hematoma Occurred while on Lovenox with coumadin post repair of him fracture. INR downtrending, 3.43 today. Last dose of coumadin 07/23/18. No planned surgical intervention. Would avoid reversal with vita K. Continue to hold coumadin. Possible restart tomorrow, aim for low end of 2-3 range. Would keep in house to avoid needing lovenox bridge again.  2. S/p Mechanical aortic valve - Coumadin on hold for #1  3.  Chronic combined CHF - Euvolemic. Lasix on hold.  4. Acute anemia due to #1 - S/p 2 units of PRBCs. Hgb improved to 8.9, though downtrending today. No evidence of compartment syndrome today.   Time Spent Directly with Patient: I have spent a total of 25 minutes with the patient reviewing hospital notes, telemetry, EKGs, labs and examining the patient as well as establishing an assessment and plan that was discussed personally with the patient.  > 50% of time was spent in direct patient care.  Length of Stay:  LOS: 2 days   Jodelle Red, MD, PhD [facility]  [facility]   07/25/2018, 12:45 PM      For questions or updates, please contact [facility] Please consult [email or web address] for contact info under Cardiology/STEMI.

## 2018-07-25 NOTE — Evaluation (Signed)
Clinical/Bedside Swallow Evaluation Patient Details  Name: [name] MRN: [id number] Date of Birth: [birth year]  Today's Date: 07/25/2018 Time: SLP Start Time (ACUTE ONLY): 1521 SLP Stop Time (ACUTE ONLY): 1533 SLP Time Calculation (min) (ACUTE ONLY): 12 min  Past Medical History:  Past Medical History:  Diagnosis Date  . Carotid artery stenosis    Without infarction  . Chronic combined systolic (congestive) and diastolic (congestive) heart failure (HCC)   . COPD (chronic obstructive pulmonary disease) (HCC)   . Coronary artery disease   . Heart valve replaced by other means   . Hypercholesterolemia    Pure  . Hypertension    Unspecified  . LBBB (left bundle branch block)   . Macular degeneration (senile) of retina, unspecified   . On home O2   . Postsurgical aortocoronary bypass status   . Stroke (HCC)   . Transient global amnesia   . Unspecified hereditary and idiopathic peripheral neuropathy   . Unspecified vitamin D deficiency    Past Surgical History:  Past Surgical History:  Procedure Laterality Date  . AORTIC VALVE REPLACEMENT    . APPENDECTOMY    . CORONARY ARTERY BYPASS GRAFT    . HIP PINNING,CANNULATED Right 07/18/2018   Procedure: RIGHT CANNULATED HIP PINNING;  Surgeon: Myrene Galas, MD;  Location: [facility] OR;  Service: Orthopedics;  Laterality: Right;  . TONSILLECTOMY     HPI:  84yo female admitted from SNF rehab 9/08 with significant pain and swelling in her right thigh.  Labs were checked at the nursing home and she was found to have a worsening anemia as well as a supratherapeutic INR. She received R cannulated hip pinning on 07/18/18. PMH CHF, COPD, HTN, L BBB, macular degeneration, CVA, transient global amnesia, hx CABG    Assessment / Plan / Recommendation Clinical Impression  Pt presents with normal oropharyngeal swallow.  She describes odynophagia immediately post-surgery and for a few days, with globus, but endorses improvement daily.  Today, she  presents with normal mastication, brisk swallow response, no overt s/s of aspiration, no breathing/swallowing dyssynchrony.  Recommend continuing current diet - regular solids, thin liquids.  No f/u needed.  SLP Visit Diagnosis: Dysphagia, unspecified (R13.10)    Aspiration Risk  No limitations    Diet Recommendation   regular solids, thin liquids  Medication Administration: Whole meds with liquid    Other  Recommendations Oral Care Recommendations: Oral care BID   Follow up Recommendations None      Frequency and Duration            Prognosis        Swallow Study   General HPI: 84yo female admitted from SNF rehab 9/08 with significant pain and swelling in her right thigh.  Labs were checked at the nursing home and she was found to have a worsening anemia as well as a supratherapeutic INR. She received R cannulated hip pinning on 07/18/18. PMH CHF, COPD, HTN, L BBB, macular degeneration, CVA, transient global amnesia, hx CABG  Type of Study: Bedside Swallow Evaluation Previous Swallow Assessment: no Diet Prior to this Study: Regular;Thin liquids Temperature Spikes Noted: No Respiratory Status: Nasal cannula History of Recent Intubation: Yes Length of Intubations (days): (only for duration of surgery) Behavior/Cognition: Alert;Cooperative;Pleasant mood Oral Cavity Assessment: Within Functional Limits Oral Care Completed by SLP: Recent completion by staff Oral Cavity - Dentition: Adequate natural dentition Vision: Functional for self-feeding Self-Feeding Abilities: Able to feed self Patient Positioning: Upright in bed Baseline Vocal Quality: Normal Volitional Cough: Strong  Volitional Swallow: Able to elicit    Oral/Motor/Sensory Function Overall Oral Motor/Sensory Function: Within functional limits   Ice Chips Ice chips: Within functional limits   Thin Liquid Thin Liquid: Within functional limits    Nectar Thick Nectar Thick Liquid: Not tested   Honey Thick Honey Thick  Liquid: Not tested   Puree Puree: Not tested   Solid     Solid: Within functional limits      Blenda Mounts Laurice 07/25/2018,3:47 PM  Marchelle Folks L. Samson Frederic, [place] CCC/SLP Pager [phone]

## 2018-07-25 NOTE — Progress Notes (Signed)
Orthopedic Trauma Service Progress Note   Patient ID: LAHARI SUTTLES MRN: [id number] DOB/AGE: 04/06/1926 84 y.o.  Subjective:  Doing well Eating lunch in chair Thinks her swelling is decreasing No pain  No numbness or tingling    ROS As above  Objective:   VITALS:   Vitals:   07/24/18 2012 07/25/18 0645 07/25/18 0806 07/25/18 1000  BP: (!) 137/46 (!) 138/55  (!) 128/56  Pulse: 70 92  88  Resp: 18 20  18   Temp: 99.3 F (37.4 C) 99.8 F (37.7 C)  98.8 F (37.1 C)  TempSrc: Oral Oral  Oral  SpO2: 100% 100% 98% 98%  Weight:      Height:        Estimated body mass index is 21.13 kg/m as calculated from the following:   Height as of this encounter: 5\' 2"  (1.575 m).   Weight as of this encounter: 52.4 kg.   Intake/Output      09/09 0701 - 09/10 0700 09/10 0701 - 09/11 0700   P.O. 990 240   I.V. (mL/kg) 0 (0)    Blood     Total Intake(mL/kg) 990 (18.9) 240 (4.6)   Urine (mL/kg/hr) 150 (0.1) 0 (0)   Emesis/NG output     Other     Stool 0 0   Blood     Total Output 150 0   Net +840 +240        Urine Occurrence 1 x 1 x   Stool Occurrence 0 x 0 x     LABS  Results for orders placed or performed during the hospital encounter of 07/23/18 (from the past 24 hour(s))  CBC     Status: Abnormal   Collection Time: 07/24/18  6:09 PM  Result Value Ref Range   WBC 13.1 (H) 4.0 - 10.5 K/uL   RBC 2.61 (L) 3.87 - 5.11 MIL/uL   Hemoglobin 7.9 (L) 12.0 - 15.0 g/dL   HCT 40.9 (L) 81.1 - 91.4 %   MCV 95.0 78.0 - 100.0 fL   MCH 30.3 26.0 - 34.0 pg   MCHC 31.9 30.0 - 36.0 g/dL   RDW 78.2 95.6 - 21.3 %   Platelets 155 150 - 400 K/uL  CBC     Status: Abnormal   Collection Time: 07/25/18  2:30 AM  Result Value Ref Range   WBC 12.8 (H) 4.0 - 10.5 K/uL   RBC 2.41 (L) 3.87 - 5.11 MIL/uL   Hemoglobin 7.5 (L) 12.0 - 15.0 g/dL   HCT 08.6 (L) 57.8 - 46.9 %   MCV 95.4 78.0 - 100.0 fL   MCH 31.1 26.0 - 34.0 pg   MCHC 32.6 30.0 - 36.0 g/dL   RDW 62.9  52.8 - 41.3 %   Platelets 164 150 - 400 K/uL  Protime-INR     Status: Abnormal   Collection Time: 07/25/18  2:30 AM  Result Value Ref Range   Prothrombin Time 34.3 (H) 11.4 - 15.2 seconds   INR 3.43      PHYSICAL EXAM:    Gen: sitting in bedside chair, NAD, eating lunch, appears well  Ext:       Right Lower Extremity   Swelling still pronounced to R thigh  Does not appear worse than yesterday  Motor and sensory functions remain intact and unchanged  Ext warm   + DP pulse   TED hose still not placed   Assessment/Plan:     Active Problems:   Hyperlipidemia   Essential  hypertension   S/P AVR (aortic valve replacement)   Chronic respiratory failure with hypoxia, on home O2 therapy (HCC)   Hematoma of thigh, right, initial encounter   Chronic combined systolic and diastolic CHF (congestive heart failure) (HCC)   Supratherapeutic INR   Acute blood loss anemia   CKD (chronic kidney disease) stage 3, GFR 30-59 ml/min (HCC)   COPD (chronic obstructive pulmonary disease) (HCC)   Hematoma of right thigh   Pressure injury of skin   Anti-infectives (From admission, onward)   None    . 84 year old female on chronic anticoagulation for mechanical heart valve s/p percutaneous pinning of subacute right femoral neck fracture on 07/18/2018 admitted for supratherapeutic INR and anterior right thigh compartment hematoma   -Subacute right femoral neck fracture s/p percutaneous pinning on 07/18/2018             Weight-bear as tolerated             No hip or knee range of motion restrictions             PT and OT   -Right thigh anterior compartment hematoma             appears stable  No clinical evidence of expansion  Still recommend TED hose (thigh high)  Ice   Elevate   Will continue to monitor    - ABL anemia/Hemodynamics             Per medical service   - Medical issues              Supratherapeutic INR- improving                          Per primary team   - Dispo:              Will follow along             Per medicine   Mearl Latin, PA-C [facility] [phone] Traci Sermon (C) 07/25/2018, 2:00 PM

## 2018-07-25 NOTE — Progress Notes (Signed)
Stage 1 on her sacral region 2.5 cm x 2.5 cm  it has already a pink foam on it.No subcutaneous tissue on this part of her sacral area,just bone and skin.Otherwise skin is intact.

## 2018-07-25 NOTE — Progress Notes (Addendum)
PROGRESS NOTE  [name] NGE:[id number] DOB: Jul 20, 1926 DOA: 07/23/2018 PCP: Kari Baars, MD  HPI/Recap of past 24 hours:  C/o right thigh pain, family is concerned right thigh hematoma is enlarging  no fever  Family at bedside  Assessment/Plan: Active Problems:   Hyperlipidemia   Essential hypertension   S/P AVR (aortic valve replacement)   Chronic respiratory failure with hypoxia, on home O2 therapy (HCC)   Hematoma of thigh, right, initial encounter   Chronic combined systolic and diastolic CHF (congestive heart failure) (HCC)   Supratherapeutic INR   Acute blood loss anemia   CKD (chronic kidney disease) stage 3, GFR 30-59 ml/min (HCC)   COPD (chronic obstructive pulmonary disease) (HCC)   Hematoma of right thigh   Pressure injury of skin  acute anemia/right Thigh hematoma in the setting of supertherapeutic INR (INR 4.78 on presentation) -hgb on presentation is 5.8, s/p prbc transfusion x2 on 9/8 -ortho consulted, conservative management for  Thigh hematoma  -coumadin held, cardiology consulted for anticoagulation management in the setting of mechanical valve. -hgb drift down, family is concerned hematoma is enlarging, will repeat bmp in pm, transfuse prn to keep hgb >7.   R subacute non-displaced hip fracture-  S/p RIGHT CANNULATED HIP PINNING On 9/3 with cardiology for preop eval postop weightbearing status, wound care, pain management per ortho  Leukocytosis: From stress? Dehydration? No fever, she does not appear septic lasix held since admission monitor   CAD status post mechanical aortic valve placement. - chronic combined systolic/diastolic congestive  euvolemic Coumadin held due to supertherapeutic INR Cardiology consulted, coumadin and lasix management per cardiology INR 3.4 today  CKDIII  renal function stable at baseline  HTN - Continue Coreg, hold acei for now, resume if bp continue to be stable   HLD - Continue  Lipitor  Chronic respiratory failure from COPD, on home O2 4liters at baseline- this is due to underlying COPD, currently compensated, continue home nebulized medications and as needed nebulizer, continue oxygen. No wheezing on exam shortness of breath, no acute issue.  Dementia - Continue Aricept is at risk for delirium. Minimize narcotics and benzodiazepines.  Severe malnutrition in context of chronic illness, Underweight nutrition input appreciated  Stage 1 sacral pressure ulcer  2.5 cm x 2.5 cm  presented on admission Pressure off loading Skin care  Code Status: DNR  Family Communication: patient and family at bedside  Disposition Plan: snf once medically stable, need cardiology/ortho clearance   Consultants:  Cardiology  Ortho Dr Carola Frost  Procedures:  none  Antibiotics:  none   Objective: BP (!) 138/55 (BP Location: Right Arm)   Pulse 92   Temp 99.8 F (37.7 C) (Oral)   Resp 20   Ht 5\' 2"  (1.575 m)   Wt 52.4 kg   SpO2 98%   BMI 21.13 kg/m   Intake/Output Summary (Last 24 hours) at 07/25/2018 1008 Last data filed at 07/25/2018 0600 Gross per 24 hour  Intake 540 ml  Output 150 ml  Net 390 ml   Filed Weights   07/23/18 0949 07/23/18 1733  Weight: 45.6 kg 52.4 kg    Exam: Patient is examined daily including today on 07/25/2018, exams remain the same as of yesterday except that has changed    General:  Pleasant, NAD , frail,   Cardiovascular: RRR  Respiratory: CTABL  Abdomen: Soft/ND/NT, positive BS  Musculoskeletal: No Edema, right thigh hematoma  Neuro: alert, oriented to person with baseline dementia  Data Reviewed: Basic Metabolic Panel: Recent  Labs  Lab 07/20/18 0422 07/21/18 0441 07/22/18 0830 07/23/18 0840 07/23/18 1050 07/23/18 1051 07/24/18 0553  NA 141 140 141 142 139 140 140  K 3.7 3.1* 4.3 4.8 4.6 4.7 4.6  CL 106 104 104 104 100 103 105  CO2 29 29 30  33*  --  30 28  GLUCOSE 95 96 93 92 120* 125* 103*  BUN 18 20  21 19 20 20 19   CREATININE 0.97 1.00 0.85 0.88 1.10* 1.00 0.99  CALCIUM 8.7* 8.6*  8.4* 8.9 8.9  --  8.7* 8.6*  MG 1.9 1.9  --   --   --   --   --   PHOS  --  2.8  --   --   --   --   --    Liver Function Tests: No results for input(s): AST, ALT, ALKPHOS, BILITOT, PROT, ALBUMIN in the last 168 hours. No results for input(s): LIPASE, AMYLASE in the last 168 hours. No results for input(s): AMMONIA in the last 168 hours. CBC: Recent Labs  Lab 07/22/18 0830  07/23/18 1051 07/23/18 2100 07/24/18 0553 07/24/18 0934 07/24/18 1809 07/25/18 0230  WBC 9.8   < > 12.0* 13.9* 14.3* 14.5* 13.1* 12.8*  NEUTROABS 7.6  --  9.2*  --   --   --   --   --   HGB 7.4*   < > 6.7* 9.0* 8.9* 8.9* 7.9* 7.5*  HCT 23.4*   < > 20.9* 27.5* 27.5* 28.1* 24.8* 23.0*  MCV 94.7   < > 96.8 94.2 94.5 95.9 95.0 95.4  PLT 195   < > 175 142* 147* 154 155 164   < > = values in this interval not displayed.   Cardiac Enzymes:   No results for input(s): CKTOTAL, CKMB, CKMBINDEX, TROPONINI in the last 168 hours. BNP (last 3 results) Recent Labs    06/26/18 1935  BNP 1,329.0*    ProBNP (last 3 results) No results for input(s): PROBNP in the last 8760 hours.  CBG: No results for input(s): GLUCAP in the last 168 hours.  Recent Results (from the past 240 hour(s))  Surgical pcr screen     Status: None   Collection Time: 07/17/18  3:57 AM  Result Value Ref Range Status   MRSA, PCR NEGATIVE NEGATIVE Final   Staphylococcus aureus NEGATIVE NEGATIVE Final    Comment: (NOTE) The Xpert SA Assay (FDA approved for NASAL specimens in patients 81 years of age and older), is one component of a comprehensive surveillance program. It is not intended to diagnose infection nor to guide or monitor treatment. Performed at [facility], [address], [place], [place] [address]      Studies: No results found.  Scheduled Meds: . atorvastatin  40 mg Oral QHS  . carvedilol  6.25 mg Oral BID WC  . docusate sodium   200 mg Oral QHS  . donepezil  10 mg Oral QHS  . Influenza vac split quadrivalent PF  0.5 mL Intramuscular Once  . polyethylene glycol  17 g Oral Daily  . pregabalin  75 mg Oral QODAY  . sodium chloride flush  3 mL Intravenous Q12H  . umeclidinium-vilanterol  1 puff Inhalation Daily    Continuous Infusions: . sodium chloride       Time spent: 25 mins,  I have personally reviewed and interpreted on  07/25/2018 daily labs, tele strips, imagings as discussed above under date review session and assessment and plans.  I reviewed all  nursing notes, pharmacy notes, consultant notes,  vitals, pertinent old records  I have discussed plan of care as described above with RN , patient and family on 07/25/2018   Albertine Grates MD, PhD  [facility] Pager [phone]. If 7PM-7AM, please contact night-coverage at [email or web address], password [facility] 07/25/2018, 10:08 AM  LOS: 2 days

## 2018-07-25 NOTE — Progress Notes (Signed)
Physical Therapy Treatment Patient Details Name: [name] MRN: [id number] DOB: 1926/02/14 Today's Date: 07/25/2018    History of Present Illness 84yo female admitted from rehab with significant pain and swelling in her right thigh.  Labs were checked at the nursing home and she was found to have a worsening anemia as well as a supratherapeutic INR. She received R cannulated hip pinning on 07/18/18. PMH CHF, COPD, HTN, L BBB, macular degeneration, CVA, transient global amnesia, hx CABG     PT Comments    Continuing work on functional mobility and activity tolerance;  Noting very good progress today with upright activity, mobiltiy, including steps with RW; Pt seemed pleased with progress as well; Overall progressing well; Anticipate continuing good progress at post-acute rehabilitation.    Follow Up Recommendations  SNF     Equipment Recommendations  None recommended by PT    Recommendations for Other Services       Precautions / Restrictions Precautions Precautions: Fall Restrictions RLE Weight Bearing: Weight bearing as tolerated    Mobility  Bed Mobility Overal bed mobility: Needs Assistance Bed Mobility: Sit to Supine       Sit to supine: Mod assist;HOB elevated   General bed mobility comments: modA for management of R LE, and pad scoot of hips to EoB, vc for hand placement and R LE movement  Transfers Overall transfer level: Needs assistance Equipment used: Rolling walker (2 wheeled) Transfers: Sit to/from Stand Sit to Stand: Mod assist         General transfer comment: mod A for sit>stand, vc for hand placement to powerup, maxA for stand pivot transfer to [facility] vc for hand placement and LE sequencing  Ambulation/Gait Ambulation/Gait assistance: +2 physical assistance;Mod assist Gait Distance (Feet): 5 Feet(pivotal steps recliner to BSC to bed) Assistive device: Rolling walker (2 wheeled) Gait Pattern/deviations: Step-to pattern;Shuffle;Narrow base of  support;Decreased weight shift to right;Decreased step length - left;Decreased stance time - right     General Gait Details: Cues to push down into RW with UEs to unweigh painful R hip while stepping   Stairs             Wheelchair Mobility    Modified Rankin (Stroke Patients Only)       Balance                                            Cognition Arousal/Alertness: Awake/alert Behavior During Therapy: WFL for tasks assessed/performed Overall Cognitive Status: Within Functional Limits for tasks assessed                                        Exercises General Exercises - Lower Extremity Ankle Circles/Pumps: AROM;Both;10 reps Quad Sets: AROM;Both;10 reps Heel Slides: AAROM;Right;5 reps(self-AAROM with belt to assist)    General Comments General comments (skin integrity, edema, etc.): Session conducted on supplemental O2      Pertinent Vitals/Pain Pain Assessment: 0-10 Pain Score: 8  Pain Location: R LE with weightbearing, hip, swollen thigh and knee Pain Descriptors / Indicators: Aching;Sore;Grimacing;Guarding Pain Intervention(s): Limited activity within patient's tolerance    Home Living                      Prior Function  PT Goals (current goals can now be found in the care plan section) Acute Rehab PT Goals Patient Stated Goal: go back to [facility] and get stronger  PT Goal Formulation: With patient/family Time For Goal Achievement: 08/07/18 Potential to Achieve Goals: Fair Progress towards PT goals: Progressing toward goals    Frequency    Min 2X/week      PT Plan Current plan remains appropriate    Co-evaluation              AM-PAC PT "6 Clicks" Daily Activity  Outcome Measure  Difficulty turning over in bed (including adjusting bedclothes, sheets and blankets)?: Unable Difficulty moving from lying on back to sitting on the side of the bed? : Unable Difficulty sitting  down on and standing up from a chair with arms (e.g., wheelchair, bedside commode, etc,.)?: Unable Help needed moving to and from a bed to chair (including a wheelchair)?: A Lot Help needed walking in hospital room?: A Lot Help needed climbing 3-5 steps with a railing? : Total 6 Click Score: 8    End of Session Equipment Utilized During Treatment: Oxygen;Gait belt Activity Tolerance: Patient tolerated treatment well Patient left: in bed;with call bell/phone within reach Nurse Communication: Mobility status PT Visit Diagnosis: Unsteadiness on feet (R26.81);Difficulty in walking, not elsewhere classified (R26.2);Muscle weakness (generalized) (M62.81);History of falling (Z91.81)     Time: 1610-9604 PT Time Calculation (min) (ACUTE ONLY): 34 min  Charges:  $Gait Training: 8-22 mins $Therapeutic Activity: 8-22 mins                     Van Clines, PT  Acute Rehabilitation Services Pager [phone] Office [phone]    [name] 07/25/2018, 4:10 PM

## 2018-07-26 ENCOUNTER — Other Ambulatory Visit: Payer: Self-pay | Admitting: Pharmacist

## 2018-07-26 ENCOUNTER — Ambulatory Visit: Payer: Self-pay | Admitting: Pharmacist

## 2018-07-26 DIAGNOSIS — J9611 Chronic respiratory failure with hypoxia: Secondary | ICD-10-CM

## 2018-07-26 DIAGNOSIS — I5042 Chronic combined systolic (congestive) and diastolic (congestive) heart failure: Secondary | ICD-10-CM

## 2018-07-26 DIAGNOSIS — Z9981 Dependence on supplemental oxygen: Secondary | ICD-10-CM

## 2018-07-26 DIAGNOSIS — N183 Chronic kidney disease, stage 3 (moderate): Secondary | ICD-10-CM

## 2018-07-26 LAB — BASIC METABOLIC PANEL
Anion gap: 6 (ref 5–15)
BUN: 25 mg/dL — ABNORMAL HIGH (ref 8–23)
CHLORIDE: 102 mmol/L (ref 98–111)
CO2: 28 mmol/L (ref 22–32)
Calcium: 8.1 mg/dL — ABNORMAL LOW (ref 8.9–10.3)
Creatinine, Ser: 1.09 mg/dL — ABNORMAL HIGH (ref 0.44–1.00)
GFR calc non Af Amer: 44 mL/min — ABNORMAL LOW (ref >60)
GFR, EST AFRICAN AMERICAN: 51 mL/min — AB (ref >60)
Glucose, Bld: 100 mg/dL — ABNORMAL HIGH (ref 70–99)
Potassium: 4.1 mmol/L (ref 3.5–5.1)
SODIUM: 136 mmol/L (ref 135–145)

## 2018-07-26 LAB — CBC
HCT: 21.6 % — ABNORMAL LOW (ref 36.0–46.0)
HEMOGLOBIN: 6.8 g/dL — AB (ref 12.0–15.0)
MCH: 30.4 pg (ref 26.0–34.0)
MCHC: 31.5 g/dL (ref 30.0–36.0)
MCV: 96.4 fL (ref 78.0–100.0)
PLATELETS: 220 10*3/uL (ref 150–400)
RBC: 2.24 MIL/uL — ABNORMAL LOW (ref 3.87–5.11)
RDW: 15 % (ref 11.5–15.5)
WBC: 12 10*3/uL — AB (ref 4.0–10.5)

## 2018-07-26 LAB — PROTIME-INR
INR: 2.99
PROTHROMBIN TIME: 30.8 s — AB (ref 11.4–15.2)

## 2018-07-26 LAB — PREPARE RBC (CROSSMATCH)

## 2018-07-26 MED ORDER — SODIUM CHLORIDE 0.9% IV SOLUTION: Freq: Once | INTRAVENOUS | Status: DC

## 2018-07-26 NOTE — Progress Notes (Signed)
PROGRESS NOTE    [name]  TLX:[id number] DOB: 25-Mar-1926 DOA: 07/23/2018 PCP: Kari Baars, MD    Brief Narrative:  84 y.o. 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  Hospital course was complicated by development of postop anemia.  She received 2 units of PRBC.  Patient was discharged to nursing facility.  INR was 1.7 the time of discharge, so she was discharged on Coumadin and Lovenox bridge.  Over the past few days, she started to develop significant swelling in her right lower extremity.  She did not have any fever, cough, shortness of breath.  She had significant pain and swelling in her right thigh.  She denies any new numbness or tingling in her right leg.  Labs were checked at the nursing home and she was found to have a worsening anemia as well as a supratherapeutic INR.  She comes back to the emergency room for evaluation  ED Course: Vitals were noted to be stable.  She is not tachycardic.  Blood pressure stable.  INR is elevated at 4.7.  Hemoglobin is down to 6.7.  Serum chemistry is unrevealing.  CT of the right femur, shows anterior compartmental hematoma, probably in the vastus intermedius muscle, measuring about 700 cm in volume.  No active extravasation of contrast is noted.  Patient was ordered 2 units of PRBC.  She is been referred for admission.  Assessment & Plan:   Active Problems:   Hyperlipidemia   Essential hypertension   S/P AVR (aortic valve replacement)   Chronic respiratory failure with hypoxia, on home O2 therapy (HCC)   Hematoma of thigh, right, initial encounter   Chronic combined systolic and diastolic CHF (congestive heart failure) (HCC)  Supratherapeutic INR   Acute blood loss anemia   CKD (chronic kidney disease) stage 3, GFR 30-59 ml/min (HCC)   COPD (chronic obstructive pulmonary disease) (HCC)   Hematoma of right thigh   Pressure injury of skin  acute anemia/right Thigh hematoma in the setting of supertherapeutic INR (INR 4.78 on presentation) -hgb on presentation is 5.8, s/p prbc transfusion x2 on 9/8 -ortho consulted, conservative management for thigh hematoma noted -cardiology consulted for anticoagulation management in the setting of mechanical valve with recs for keeping INR on lower range between 2-3 -Continue pharmacy to dose coumadin  R subacute non-displaced hip fracture-  S/p RIGHT CANNULATED HIP PINNING On 9/3 with cardiology for preop eval postop weightbearing status, wound care, pain management per ortho -Stable at this time  Leukocytosis: -Question stress reaction -WBC trends reviewed, stable -Repeat CBC in AM  CAD status post mechanical aortic valve placement. - chronic combined systolic/diastolic congestive  euvolemic at this time Continue comadin dosing per pharmacy Cardiology consulted, coumadin and lasix management per cardiology INR 2.9 today  CKDIII  renal function stable at baseline -Repeat bmet in AM  HTN - Continue Coreg, hold acei for now -BP currently stable   HLD - Continue Lipitor as tolerated  Chronic respiratory failure from COPD, on home O2 4liters at baseline- this is due to underlying COPD, currently compensated, continue home nebulized medications and as needed nebulizer, continue oxygen.  -No wheezing on exam. Stable  Dementia - Continue Aricept is at risk for delirium.  -Continue to avoid over  medication with narcotics and benzodiazepines.  Severe malnutrition in context of chronic illness, Underweight -Nutrition consulted  Stage 1 sacral pressure ulcer  2.5 cm x 2.5 cmpresented on admission Pressure off loading Continue routine  care  DVT prophylaxis: Coumadin Code Status: DNR Family Communication: Pt in room, family at bedside Disposition Plan: Uncertain at this time  Consultants:   Orthopedic Surgery  Cardiology  Procedures:     Antimicrobials: Anti-infectives (From admission, onward)   None       Subjective: Without complaints at this time  Objective: Vitals:   07/26/18 1109 07/26/18 1146 07/26/18 1440 07/26/18 1732  BP: (!) 112/45 (!) 113/51 100/61 96/75  Pulse: 66 69 79 70  Resp:  18  18  Temp: 97.6 F (36.4 C) 98.5 F (36.9 C) 98.2 F (36.8 C) 98.6 F (37 C)  TempSrc: Oral Oral Oral Oral  SpO2: 98% 98% 98% 100%  Weight:      Height:        Intake/Output Summary (Last 24 hours) at 07/26/2018 1810 Last data filed at 07/26/2018 1400 Gross per 24 hour  Intake 720 ml  Output 0 ml  Net 720 ml   Filed Weights   07/23/18 0949 07/23/18 1733 07/25/18 2141  Weight: 45.6 kg 52.4 kg 52.6 kg    Examination:  General exam: Appears calm and comfortable  Respiratory system: Clear to auscultation. Respiratory effort normal. Cardiovascular system: S1 & S2 heard, RRR Gastrointestinal system: Abdomen is nondistended, soft and nontender. No organomegaly or masses felt. Normal bowel sounds heard. Central nervous system: Alert and oriented. No focal neurological deficits. Extremities: Symmetric 5 x 5 power. Skin: No rashes, lesions Psychiatry: Judgement and insight appear normal. Mood & affect appropriate.   Data Reviewed: I have personally reviewed following labs and imaging studies  CBC: Recent Labs  Lab 07/22/18 0830  07/23/18 1051  07/24/18 0934 07/24/18 1809 07/25/18 0230 07/25/18 1508 07/26/18 0459  WBC 9.8   < > 12.0*   < > 14.5* 13.1* 12.8* 12.0* 12.0*  NEUTROABS 7.6  --  9.2*  --   --   --   --   --   --   HGB 7.4*   < > 6.7*   < > 8.9* 7.9* 7.5* 7.5* 6.8*  HCT 23.4*   < > 20.9*   < > 28.1* 24.8* 23.0* 23.9* 21.6*  MCV 94.7   < > 96.8   < > 95.9 95.0 95.4 98.0 96.4   PLT 195   < > 175   < > 154 155 164 205 220   < > = values in this interval not displayed.   Basic Metabolic Panel: Recent Labs  Lab 07/20/18 0422 07/21/18 0441 07/22/18 0830 07/23/18 0840 07/23/18 1050 07/23/18 1051 07/24/18 0553 07/26/18 0459  NA 141 140 141 142 139 140 140 136  K 3.7 3.1* 4.3 4.8 4.6 4.7 4.6 4.1  CL 106 104 104 104 100 103 105 102  CO2 29 29 30  33*  --  30 28 28   GLUCOSE 95 96 93 92 120* 125* 103* 100*  BUN 18 20 21 19 20 20 19  25*  CREATININE 0.97 1.00 0.85 0.88 1.10* 1.00 0.99 1.09*  CALCIUM 8.7* 8.6*  8.4* 8.9 8.9  --  8.7* 8.6* 8.1*  MG 1.9 1.9  --   --   --   --   --   --   PHOS  --  2.8  --   --   --   --   --   --  GFR: Estimated Creatinine Clearance: 27.7 mL/min (A) (by C-G formula based on SCr of 1.09 mg/dL (H)). Liver Function Tests: No results for input(s): AST, ALT, ALKPHOS, BILITOT, PROT, ALBUMIN in the last 168 hours. No results for input(s): LIPASE, AMYLASE in the last 168 hours. No results for input(s): AMMONIA in the last 168 hours. Coagulation Profile: Recent Labs  Lab 07/23/18 0840 07/23/18 1051 07/24/18 0553 07/25/18 0230 07/26/18 0459  INR 4.22* 4.78* 4.64* 3.43 2.99   Cardiac Enzymes: No results for input(s): CKTOTAL, CKMB, CKMBINDEX, TROPONINI in the last 168 hours. BNP (last 3 results) No results for input(s): PROBNP in the last 8760 hours. HbA1C: No results for input(s): HGBA1C in the last 72 hours. CBG: No results for input(s): GLUCAP in the last 168 hours. Lipid Profile: No results for input(s): CHOL, HDL, LDLCALC, TRIG, CHOLHDL, LDLDIRECT in the last 72 hours. Thyroid Function Tests: No results for input(s): TSH, T4TOTAL, FREET4, T3FREE, THYROIDAB in the last 72 hours. Anemia Panel: No results for input(s): VITAMINB12, FOLATE, FERRITIN, TIBC, IRON, RETICCTPCT in the last 72 hours. Sepsis Labs: No results for input(s): PROCALCITON, LATICACIDVEN in the last 168 hours.  Recent Results (from the past 240  hour(s))  Surgical pcr screen     Status: None   Collection Time: 07/17/18  3:57 AM  Result Value Ref Range Status   MRSA, PCR NEGATIVE NEGATIVE Final   Staphylococcus aureus NEGATIVE NEGATIVE Final    Comment: (NOTE) The Xpert SA Assay (FDA approved for NASAL specimens in patients 75 years of age and older), is one component of a comprehensive surveillance program. It is not intended to diagnose infection nor to guide or monitor treatment. Performed at [facility], [address] [place], [place] [address]      Radiology Studies: No results found.  Scheduled Meds: . sodium chloride   Intravenous Once  . atorvastatin  40 mg Oral QHS  . carvedilol  6.25 mg Oral BID WC  . docusate sodium  200 mg Oral QHS  . donepezil  10 mg Oral QHS  . polyethylene glycol  17 g Oral Daily  . pregabalin  75 mg Oral QODAY  . sodium chloride flush  3 mL Intravenous Q12H  . umeclidinium-vilanterol  1 puff Inhalation Daily   Continuous Infusions: . sodium chloride       LOS: 3 days   Rickey Barbara, MD [facility] Pager On Amion  If 7PM-7AM, please contact night-coverage 07/26/2018, 6:10 PM

## 2018-07-26 NOTE — Progress Notes (Signed)
Brief cardiology update note:  Patient seen during PT today. She continues to have leg pain but is up ambulating in the hall today. Reviewed with her and family that her hemoglobin dropped. INR 2.99 today but still holding coumadin. Transfused 1 unit PRBC today.  Given that she is still being transfused, and her coumadin has not been restarted, would keep her at least one more day to make sure she doesn't drop any further on her hemoglobin. I want to make sure she has restarted coumadin (anticipate tomorrow) and has a stable blood count before discharge.  Jodelle Red, MD, PhD [facility]  [address] [place], [place] [address] [phone]

## 2018-07-26 NOTE — Progress Notes (Signed)
Physical Therapy Treatment Patient Details Name: [name] MRN: [id number] DOB: [birth year] Today's Date: 07/26/2018    History of Present Illness 84yo female admitted from rehab with significant pain and swelling in her right thigh.  Labs were checked at the nursing home and she was found to have a worsening anemia as well as a supratherapeutic INR. She received R cannulated hip pinning on 07/18/18. PMH CHF, COPD, HTN, L BBB, macular degeneration, CVA, transient global amnesia, hx CABG     PT Comments    Pt with increased energy on arrival. Pt able to progress ambulation into hallway 12 feet with RW and mod assist and chair follow. Pt with difficulty sitting down due to decreased hip flexion of RLE, needs assist of RLE elevation as sitting. Will continue to work with to progress ROM and functional mobility. Pt on 2L  with SpO2 at 97% on arrival, pt able to complete session on RA with SpO2 remaining in 90s throughout.   Follow Up Recommendations  SNF;Supervision for mobility/OOB     Equipment Recommendations  None recommended by PT    Recommendations for Other Services       Precautions / Restrictions      Mobility  Bed Mobility Overal bed mobility: Needs Assistance Bed Mobility: Supine to Sit     Supine to sit: Min assist;HOB elevated     General bed mobility comments: min a for AAROM of RLE and pad to scoot hips to EOB. pt use of rail   Transfers Overall transfer level: Needs assistance Equipment used: Rolling walker (2 wheeled) Transfers: Sit to/from Stand Sit to Stand: Mod assist         General transfer comment: mod a to rise, vc for hand placement to power up, vc for posture and hip extension. stand to sit, pt required assist to extend and elevate RLE to limit hip flexion secondary to pain as sitting   Ambulation/Gait Ambulation/Gait assistance: Mod assist;+2 safety/equipment(+2 for chair follow only ) Gait Distance (Feet): 12 Feet Assistive device:  Rolling walker (2 wheeled) Gait Pattern/deviations: Step-to pattern;Narrow base of support;Decreased weight shift to right;Decreased stance time - right;Decreased stride length Gait velocity: decreased Gait velocity interpretation: <1.31 ft/sec, indicative of household ambulator General Gait Details: Cues to push down into RW with UEs to unweigh painful R hip while stepping   Stairs             Wheelchair Mobility    Modified Rankin (Stroke Patients Only)       Balance Overall balance assessment: Needs assistance Sitting-balance support: Feet supported;Bilateral upper extremity supported Sitting balance-Leahy Scale: Fair Sitting balance - Comments: able to remove bil UE support for short time and maintain balance      Standing balance-Leahy Scale: Poor Standing balance comment: reliant on bil UE support                            Cognition Arousal/Alertness: Awake/alert Behavior During Therapy: WFL for tasks assessed/performed Overall Cognitive Status: Within Functional Limits for tasks assessed                                        Exercises      General Comments General comments (skin integrity, edema, etc.): session on RA, SpO2 93-96%      Pertinent Vitals/Pain Pain Assessment: 0-10 Pain Score: 6  Pain  Location: RLE increases with mobility  Pain Descriptors / Indicators: Aching;Sore;Grimacing;Guarding Pain Intervention(s): Premedicated before session;Monitored during session;Limited activity within patient's tolerance;Repositioned    Home Living                      Prior Function            PT Goals (current goals can now be found in the care plan section) Progress towards PT goals: Progressing toward goals    Frequency    Min 2X/week      PT Plan Current plan remains appropriate    Co-evaluation              AM-PAC PT "6 Clicks" Daily Activity  Outcome Measure  Difficulty turning over in bed  (including adjusting bedclothes, sheets and blankets)?: Unable Difficulty moving from lying on back to sitting on the side of the bed? : Unable Difficulty sitting down on and standing up from a chair with arms (e.g., wheelchair, bedside commode, etc,.)?: Unable Help needed moving to and from a bed to chair (including a wheelchair)?: A Little Help needed walking in hospital room?: A Lot Help needed climbing 3-5 steps with a railing? : Total 6 Click Score: 9    End of Session Equipment Utilized During Treatment: Gait belt Activity Tolerance: Patient tolerated treatment well;Patient limited by pain Patient left: in chair;with call bell/phone within reach;with chair alarm set;with family/visitor present Nurse Communication: Mobility status PT Visit Diagnosis: Unsteadiness on feet (R26.81);Difficulty in walking, not elsewhere classified (R26.2);Muscle weakness (generalized) (M62.81);History of falling (Z91.81)     Time: 0981-1914 PT Time Calculation (min) (ACUTE ONLY): 29 min  Charges:  $Gait Training: 8-22 mins $Therapeutic Activity: 8-22 mins                     Carma Lair, [place]  Acute Rehab [phone]    Carma Lair 07/26/2018, 4:46 PM

## 2018-07-26 NOTE — Progress Notes (Signed)
CRITICAL VALUE ALERT  Critical Value:  Hemoglobin 6.8  Date & Time Notied:  07/26/18 06:05  Provider Notified: NP Schorr   Orders Received/Actions taken: Text paged. Awaiting response

## 2018-07-26 NOTE — Progress Notes (Signed)
Called blood bank and informed blood bank patient has an order to be transfused 1 unit of PRBC. Per blood bank, type and screen was last done 07/23/18 at [facility]. Patient will need another type and screen at [facility] before transfusion. Text paged NP for type type and screen orders.

## 2018-07-26 NOTE — Progress Notes (Signed)
Physical Therapy Treatment Patient Details Name: [name] MRN: [id number] DOB: 01-19-1926 Today's Date: 07/26/2018    History of Present Illness 84yo female admitted from rehab with significant pain and swelling in her right thigh.  Labs were checked at the nursing home and she was found to have a worsening anemia as well as a supratherapeutic INR. She received R cannulated hip pinning on 07/18/18. PMH CHF, COPD, HTN, L BBB, macular degeneration, CVA, transient global amnesia, hx CABG     PT Comments    Pt pleasant on arrival visiting with daughter. Pt reports had previously done her HEP program this morning. Pt with hemoglobin at 6.8, activity limited to bed exercises. Pt with scheduled transfusion today, informed PT will "hopefully" return afterwards to work on ambulation with pt agreeable. Pt and daughter educated on additional HEP, pt able to progress LE exercises and ROM today with handout provided. Pt activation and initiation improved with repetition and tactile cues. Pt encouraged to continue exercises 2-3x a day. VSS throughout. Pt able to tolerate all activity on RA with SpO2 ranging from 93-97% throughout, nurse aware.     Follow Up Recommendations  SNF     Equipment Recommendations  None recommended by PT    Recommendations for Other Services       Precautions / Restrictions Precautions Precautions: Fall Restrictions RLE Weight Bearing: Weight bearing as tolerated    Mobility                        Balance                                            Cognition Arousal/Alertness: Awake/alert Behavior During Therapy: WFL for tasks assessed/performed Overall Cognitive Status: Within Functional Limits for tasks assessed                                        Exercises Total Joint Exercises Gluteal Sets: AROM;Both;10 reps Towel Squeeze: AROM;Both;10 reps General Exercises - Lower Extremity Ankle Circles/Pumps:  AROM;Both;10 reps Quad Sets: AROM;Both;10 reps Short Arc QuadBarbaraann Boys;Right;10 reps(AROM LLE) Heel Slides: AAROM;Right;10 reps(AROM LLE) Hip ABduction/ADduction: AAROM;Left;10 reps    General Comments        Pertinent Vitals/Pain Pain Assessment: 0-10 Pain Score: 8  Pain Location: RLE with motion; pt stated, "not too bad, it's an 8" Pain Descriptors / Indicators: Aching;Sore;Grimacing;Guarding Pain Intervention(s): Monitored during session    Home Living                      Prior Function            PT Goals (current goals can now be found in the care plan section) Acute Rehab PT Goals Patient Stated Goal: go back to [facility] and get stronger  PT Goal Formulation: With patient/family Time For Goal Achievement: 08/07/18 Potential to Achieve Goals: Fair Progress towards PT goals: Progressing toward goals    Frequency    Min 2X/week      PT Plan Current plan remains appropriate    Co-evaluation              AM-PAC PT "6 Clicks" Daily Activity  Outcome Measure  Difficulty turning over in bed (including adjusting bedclothes, sheets and blankets)?:  Unable Difficulty moving from lying on back to sitting on the side of the bed? : Unable Difficulty sitting down on and standing up from a chair with arms (e.g., wheelchair, bedside commode, etc,.)?: Unable Help needed moving to and from a bed to chair (including a wheelchair)?: A Lot Help needed walking in hospital room?: A Lot Help needed climbing 3-5 steps with a railing? : Total 6 Click Score: 8    End of Session   Activity Tolerance: Patient tolerated treatment well Patient left: in bed;with call bell/phone within reach Nurse Communication: Mobility status PT Visit Diagnosis: Unsteadiness on feet (R26.81);Difficulty in walking, not elsewhere classified (R26.2);Muscle weakness (generalized) (M62.81);History of falling (Z91.81)     Time: 7672-0947 PT Time Calculation (min) (ACUTE ONLY): 30  min  Charges:  $Therapeutic Exercise: 23-37 mins                     Carma Lair, [place]  Acute Rehab [phone]    Carma Lair 07/26/2018, 10:52 AM

## 2018-07-26 NOTE — Patient Outreach (Signed)
[facility]) Care Management  07/26/2018  YISELA HAMMERSTROM [birth year] [id number]   Patient currently inpatient at [facility] for surgery of hairline fracture.  Discharge plan is for Ms Cockrill to return to [facility].    PLAN: -Will continue to follow chart to monitor if discharge needs identified (post-SNF)  Kieth Brightly, PharmD, [facility] Clinical Pharmacist [facility]  [phone]

## 2018-07-27 DIAGNOSIS — I10 Essential (primary) hypertension: Secondary | ICD-10-CM

## 2018-07-27 DIAGNOSIS — D649 Anemia, unspecified: Secondary | ICD-10-CM

## 2018-07-27 LAB — CBC
HCT: 28.1 % — ABNORMAL LOW (ref 36.0–46.0)
HEMOGLOBIN: 8.9 g/dL — AB (ref 12.0–15.0)
MCH: 30.7 pg (ref 26.0–34.0)
MCHC: 31.7 g/dL (ref 30.0–36.0)
MCV: 96.9 fL (ref 78.0–100.0)
Platelets: 291 10*3/uL (ref 150–400)
RBC: 2.9 MIL/uL — AB (ref 3.87–5.11)
RDW: 15.5 % (ref 11.5–15.5)
WBC: 12.5 10*3/uL — ABNORMAL HIGH (ref 4.0–10.5)

## 2018-07-27 LAB — TYPE AND SCREEN
ABO/RH(D): O NEG
Antibody Screen: NEGATIVE
Unit division: 0

## 2018-07-27 LAB — PROTIME-INR
INR: 2.32
PROTHROMBIN TIME: 25.3 s — AB (ref 11.4–15.2)

## 2018-07-27 LAB — BPAM RBC
Blood Product Expiration Date: 201909142359
ISSUE DATE / TIME: 201909111125
Unit Type and Rh: 9500

## 2018-07-27 MED ORDER — WARFARIN - PHARMACIST DOSING INPATIENT
Freq: Every day | Status: DC
  Administered 2018-07-28: 18:00:00

## 2018-07-27 MED ORDER — WARFARIN SODIUM 2.5 MG PO TABS
2.5000 mg | ORAL_TABLET | Freq: Once | ORAL | Status: AC
  Administered 2018-07-27: 2.5 mg via ORAL
  Filled 2018-07-27: qty 1

## 2018-07-27 MED ORDER — PHENOL 1.4 % MT LIQD
1.0000 | OROMUCOSAL | Status: DC | PRN
  Administered 2018-07-27 – 2018-07-30 (×2): 1 via OROMUCOSAL
  Filled 2018-07-27 (×2): qty 177

## 2018-07-27 NOTE — Progress Notes (Signed)
PROGRESS NOTE    Colleen CablesRuth E [name]  UEA:[id number] DOB: November 03, 1926 DOA: 07/23/2018 PCP: Kari BaarsHawkins, Edward, MD    Brief Narrative:  84 y.o. 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  Hospital course was complicated by development of postop anemia.  She received 2 units of PRBC.  Patient was discharged to nursing facility.  INR was 1.7 the time of discharge, so she was discharged on Coumadin and Lovenox bridge.  Over the past few days, she started to develop significant swelling in her right lower extremity.  She did not have any fever, cough, shortness of breath.  She had significant pain and swelling in her right thigh.  She denies any new numbness or tingling in her right leg.  Labs were checked at the nursing home and she was found to have a worsening anemia as well as a supratherapeutic INR.  She comes back to the emergency room for evaluation  ED Course: Vitals were noted to be stable.  She is not tachycardic.  Blood pressure stable.  INR is elevated at 4.7.  Hemoglobin is down to 6.7.  Serum chemistry is unrevealing.  CT of the right femur, shows anterior compartmental hematoma, probably in the vastus intermedius muscle, measuring about 700 cm in volume.  No active extravasation of contrast is noted.  Patient was ordered 2 units of PRBC.  She is been referred for admission.  Assessment & Plan:   Active Problems:   Hyperlipidemia   Essential hypertension   S/P AVR (aortic valve replacement)   Chronic respiratory failure with hypoxia, on home O2 therapy (HCC)   Hematoma of thigh, right, initial encounter   Chronic combined systolic and diastolic CHF (congestive heart failure) (HCC)  Supratherapeutic INR   Acute blood loss anemia   CKD (chronic kidney disease) stage 3, GFR 30-59 ml/min (HCC)   COPD (chronic obstructive pulmonary disease) (HCC)   Hematoma of right thigh   Pressure injury of skin  acute anemia/right Thigh hematoma in the setting of supertherapeutic INR (INR 4.78 on presentation) -hgb on presentation is 5.8, s/p prbc transfusion x2 on 9/8 -ortho consulted, conservative management for thigh hematoma noted -cardiology consulted for anticoagulation management in the setting of mechanical valve with recs for keeping INR on lower range between 2-3 -Pharmacy to dose coumadin. To resume today. Given concerns for life threatening bleeding event, recommendations to continue to monitor while resuming coumadin noted. Will repeat CBC and INR in AM  R subacute non-displaced hip fracture-  S/p RIGHT CANNULATED HIP PINNING On 9/3 with cardiology for preop eval postop weightbearing status, wound care, pain management per ortho -Stable at this time  Leukocytosis: -Question stress reaction -WBC trends reviewed, stable -Recheck CBC in AM  CAD status post mechanical aortic valve placement. - chronic combined systolic/diastolic congestive  euvolemic at this time Continue comadin dosing per pharmacy Cardiology consulted, coumadin and lasix management per cardiology INR 2.3 this AM  CKDIII  renal function stable at baseline  HTN - Continue Coreg, hold acei for now -BP currently stable   HLD - Continue Lipitor as tolerated  Chronic respiratory failure from COPD, on home O2 4liters at baseline- this is due to underlying COPD, currently compensated, continue home nebulized medications and as needed nebulizer, continue  oxygen.  -No wheezing on exam. Stable  Dementia - Continue Aricept is at risk for delirium.  -Continue to avoid over medication with narcotics and benzodiazepines.  Severe malnutrition in context of chronic illness,  Underweight -Nutrition consulted  Stage 1 sacral pressure ulcer  2.5 cm x 2.5 cmpresented on admission Pressure off loading Continue routine care  DVT prophylaxis: Coumadin Code Status: DNR Family Communication: Pt in room, family at bedside Disposition Plan: Uncertain at this time  Consultants:   Orthopedic Surgery  Cardiology  Procedures:     Antimicrobials: Anti-infectives (From admission, onward)   None      Subjective: No complaints at present  Objective: Vitals:   07/26/18 2130 07/27/18 0448 07/27/18 0846 07/27/18 0925  BP: (!) 130/45 (!) 146/50  (!) 91/40  Pulse: 72 80  76  Resp: 16 16  18   Temp: 98.6 F (37 C) 98.6 F (37 C)    TempSrc: Oral Oral  Oral  SpO2: 99% 100% 93% 95%  Weight:      Height:        Intake/Output Summary (Last 24 hours) at 07/27/2018 1533 Last data filed at 07/27/2018 1320 Gross per 24 hour  Intake 480 ml  Output 0 ml  Net 480 ml   Filed Weights   07/23/18 0949 07/23/18 1733 07/25/18 2141  Weight: 45.6 kg 52.4 kg 52.6 kg    Examination: General exam: Awake, laying in bed, in nad Respiratory system: Normal respiratory effort, no wheezing Cardiovascular system: regular rate, s1, s2  Data Reviewed: I have personally reviewed following labs and imaging studies  CBC: Recent Labs  Lab 07/22/18 0830  07/23/18 1051  07/24/18 1809 07/25/18 0230 07/25/18 1508 07/26/18 0459 07/27/18 0548  WBC 9.8   < > 12.0*   < > 13.1* 12.8* 12.0* 12.0* 12.5*  NEUTROABS 7.6  --  9.2*  --   --   --   --   --   --   HGB 7.4*   < > 6.7*   < > 7.9* 7.5* 7.5* 6.8* 8.9*  HCT 23.4*   < > 20.9*   < > 24.8* 23.0* 23.9* 21.6* 28.1*  MCV 94.7   < > 96.8   < > 95.0 95.4 98.0 96.4 96.9  PLT 195   < > 175   < > 155 164 205 220 291   < > = values in this interval not displayed.   Basic Metabolic Panel: Recent Labs  Lab 07/21/18 0441 07/22/18 0830 07/23/18 0840 07/23/18 1050 07/23/18 1051 07/24/18 0553 07/26/18 0459  NA 140 141 142 139  140 140 136  K 3.1* 4.3 4.8 4.6 4.7 4.6 4.1  CL 104 104 104 100 103 105 102  CO2 29 30 33*  --  30 28 28   GLUCOSE 96 93 92 120* 125* 103* 100*  BUN 20 21 19 20 20 19  25*  CREATININE 1.00 0.85 0.88 1.10* 1.00 0.99 1.09*  CALCIUM 8.6*  8.4* 8.9 8.9  --  8.7* 8.6* 8.1*  MG 1.9  --   --   --   --   --   --   PHOS 2.8  --   --   --   --   --   --    GFR: Estimated Creatinine Clearance: 27.7 mL/min (A) (by C-G formula based on SCr of 1.09 mg/dL (H)). Liver Function Tests: No results for input(s): AST, ALT, ALKPHOS, BILITOT, PROT, ALBUMIN in the last 168 hours. No results for input(s):  LIPASE, AMYLASE in the last 168 hours. No results for input(s): AMMONIA in the last 168 hours. Coagulation Profile: Recent Labs  Lab 07/23/18 1051 07/24/18 0553 07/25/18 0230 07/26/18 0459 07/27/18 0548  INR 4.78* 4.64* 3.43 2.99 2.32   Cardiac Enzymes: No results for input(s): CKTOTAL, CKMB, CKMBINDEX, TROPONINI in the last 168 hours. BNP (last 3 results) No results for input(s): PROBNP in the last 8760 hours. HbA1C: No results for input(s): HGBA1C in the last 72 hours. CBG: No results for input(s): GLUCAP in the last 168 hours. Lipid Profile: No results for input(s): CHOL, HDL, LDLCALC, TRIG, CHOLHDL, LDLDIRECT in the last 72 hours. Thyroid Function Tests: No results for input(s): TSH, T4TOTAL, FREET4, T3FREE, THYROIDAB in the last 72 hours. Anemia Panel: No results for input(s): VITAMINB12, FOLATE, FERRITIN, TIBC, IRON, RETICCTPCT in the last 72 hours. Sepsis Labs: No results for input(s): PROCALCITON, LATICACIDVEN in the last 168 hours.  No results found for this or any previous visit (from the past 240 hour(s)).   Radiology Studies: No results found.  Scheduled Meds: . sodium chloride   Intravenous Once  . atorvastatin  40 mg Oral QHS  . carvedilol  6.25 mg Oral BID WC  . docusate sodium  200 mg Oral QHS  . donepezil  10 mg Oral QHS  . polyethylene glycol  17 g Oral Daily  .  pregabalin  75 mg Oral QODAY  . sodium chloride flush  3 mL Intravenous Q12H  . umeclidinium-vilanterol  1 puff Inhalation Daily  . warfarin  2.5 mg Oral ONCE-1800  . Warfarin - Pharmacist Dosing Inpatient   Does not apply q1800   Continuous Infusions: . sodium chloride       LOS: 4 days   Rickey Barbara, MD [facility] Pager On Amion  If 7PM-7AM, please contact night-coverage 07/27/2018, 3:33 PM

## 2018-07-27 NOTE — Progress Notes (Signed)
Physical Therapy Treatment Patient Details Name: [name] MRN: [id number] DOB: [birth year] Today's Date: 07/27/2018    History of Present Illness 84yo female admitted from rehab with significant pain and swelling in her right thigh.  Labs were checked at the nursing home and she was found to have a worsening anemia as well as a supratherapeutic INR. She received R cannulated hip pinning on 07/18/18. PMH CHF, COPD, HTN, L BBB, macular degeneration, CVA, transient global amnesia, hx CABG     PT Comments    Patient with slow progression towards physical therapy goals, as evidenced by increased ambulation distance to 20 feet with walker and moderate assistance. Continues to display limited activity tolerance, pain (premedicated prior to session), and gross RLE weakness. Will continue to progress as tolerated.    Follow Up Recommendations  SNF;Supervision for mobility/OOB     Equipment Recommendations  None recommended by PT    Recommendations for Other Services       Precautions / Restrictions Precautions Precautions: Fall Restrictions Weight Bearing Restrictions: Yes RLE Weight Bearing: Weight bearing as tolerated    Mobility  Bed Mobility               General bed mobility comments: OOB in chair  Transfers Overall transfer level: Needs assistance Equipment used: Rolling walker (2 wheeled) Transfers: Sit to/from Stand Sit to Stand: Min assist         General transfer comment: vcs for hand placement for safety/efficiency. upon transition from stand to sit, manual assistance to elevate RLE secondary to pain  Ambulation/Gait Ambulation/Gait assistance: Mod assist;+2 safety/equipment Gait Distance (Feet): 22 Feet Assistive device: Rolling walker (2 wheeled) Gait Pattern/deviations: Step-to pattern;Narrow base of support;Decreased weight shift to right;Decreased stance time - right;Decreased stride length Gait velocity: decreased Gait velocity interpretation:  <1.31 ft/sec, indicative of household ambulator General Gait Details: patient with significant bilateral shoulder elevation and heavy reliance through arms on walker. max cues for upright posture and looking up. pt able to mostly self cue for sequencing. increased difficulty with turns. fatigues easily and required chair follow.   Stairs             Wheelchair Mobility    Modified Rankin (Stroke Patients Only)       Balance Overall balance assessment: Needs assistance Sitting-balance support: Feet supported;Bilateral upper extremity supported Sitting balance-Leahy Scale: Fair       Standing balance-Leahy Scale: Poor Standing balance comment: reliant on bil UE support                            Cognition Arousal/Alertness: Awake/alert Behavior During Therapy: WFL for tasks assessed/performed Overall Cognitive Status: Within Functional Limits for tasks assessed                                        Exercises      General Comments General comments (skin integrity, edema, etc.): session on 2L O2      Pertinent Vitals/Pain Pain Assessment: Faces Faces Pain Scale: Hurts whole lot Pain Location: RLE increases with mobility  Pain Descriptors / Indicators: Aching;Sore;Grimacing;Guarding Pain Intervention(s): Limited activity within patient's tolerance;Monitored during session;Premedicated before session    Home Living                      Prior Function  PT Goals (current goals can now be found in the care plan section) Acute Rehab PT Goals Patient Stated Goal: go back to [facility] and get stronger  Potential to Achieve Goals: Fair Progress towards PT goals: Progressing toward goals    Frequency    Min 3X/week      PT Plan Frequency needs to be updated    Co-evaluation              AM-PAC PT "6 Clicks" Daily Activity  Outcome Measure  Difficulty turning over in bed (including adjusting  bedclothes, sheets and blankets)?: Unable Difficulty moving from lying on back to sitting on the side of the bed? : Unable Difficulty sitting down on and standing up from a chair with arms (e.g., wheelchair, bedside commode, etc,.)?: Unable Help needed moving to and from a bed to chair (including a wheelchair)?: A Little Help needed walking in hospital room?: A Lot Help needed climbing 3-5 steps with a railing? : Total 6 Click Score: 9    End of Session Equipment Utilized During Treatment: Gait belt Activity Tolerance: Patient limited by pain Patient left: in chair;with call bell/phone within reach;with chair alarm set;with family/visitor present Nurse Communication: Mobility status PT Visit Diagnosis: Unsteadiness on feet (R26.81);Difficulty in walking, not elsewhere classified (R26.2);Muscle weakness (generalized) (M62.81);History of falling (Z91.81)     Time: 6045-4098 PT Time Calculation (min) (ACUTE ONLY): 20 min  Charges:  $Gait Training: 8-22 mins                     Laurina Bustle, [place], DPT Acute Rehabilitation Services Pager [phone] Office [phone]    Vanetta Mulders 07/27/2018, 3:10 PM

## 2018-07-27 NOTE — Progress Notes (Addendum)
Progress Note  Patient Name: [name] Date of Encounter: 07/27/2018  Primary Cardiologist: [name], MD   Subjective   Pain with ambulation yesterday with PT, but she was able to push through. No loss of sensation/pulses on the foot. Still very tender in the thigh.  Inpatient Medications    Scheduled Meds: . sodium chloride   Intravenous Once  . atorvastatin  40 mg Oral QHS  . carvedilol  6.25 mg Oral BID WC  . docusate sodium  200 mg Oral QHS  . donepezil  10 mg Oral QHS  . polyethylene glycol  17 g Oral Daily  . pregabalin  75 mg Oral QODAY  . sodium chloride flush  3 mL Intravenous Q12H  . umeclidinium-vilanterol  1 puff Inhalation Daily  . warfarin  2.5 mg Oral ONCE-1800  . Warfarin - Pharmacist Dosing Inpatient   Does not apply q1800   Continuous Infusions: . sodium chloride     PRN Meds: sodium chloride, acetaminophen **OR** acetaminophen, ALPRAZolam, HYDROcodone-acetaminophen, levalbuterol, meclizine, ondansetron **OR** ondansetron (ZOFRAN) IV, phenol, sodium chloride flush   Vital Signs    Vitals:   07/26/18 2130 07/27/18 0448 07/27/18 0846 07/27/18 0925  BP: (!) 130/45 (!) 146/50  (!) 91/40  Pulse: 72 80  76  Resp: 16 16  18   Temp: 98.6 F (37 C) 98.6 F (37 C)    TempSrc: Oral Oral  Oral  SpO2: 99% 100% 93% 95%  Weight:      Height:        Intake/Output Summary (Last 24 hours) at 07/27/2018 1312 Last data filed at 07/27/2018 0900 Gross per 24 hour  Intake 480 ml  Output 0 ml  Net 480 ml   Filed Weights   07/23/18 0949 07/23/18 1733 07/25/18 2141  Weight: 45.6 kg 52.4 kg 52.6 kg    Telemetry    Not on telemetry - Personally Reviewed  ECG    No new - Personally Reviewed  Physical Exam   GEN: No acute distress.   Neck: supple, no JVD Cardiac: regular S1 and S2, no murmurs, rubs, or gallops. Crisp mechanical valve closure Respiratory: Clear to auscultation bilaterally. GI: Soft, nontender, non-distended. Bowel sounds  normal MS: Right thigh with 2+ swelling and tenderness at upper lateral aspect. Warm, palpable pulses distal, sensation intact Neuro:  Nonfocal, moves all limbs independently Psych: Normal affect   Labs    Chemistry Recent Labs  Lab 07/23/18 1051 07/24/18 0553 07/26/18 0459  NA 140 140 136  K 4.7 4.6 4.1  CL 103 105 102  CO2 30 28 28   GLUCOSE 125* 103* 100*  BUN 20 19 25*  CREATININE 1.00 0.99 1.09*  CALCIUM 8.7* 8.6* 8.1*  GFRNONAA 48* 49* 44*  GFRAA 56* 57* 51*  ANIONGAP 7 7 6      Hematology Recent Labs  Lab 07/25/18 1508 07/26/18 0459 07/27/18 0548  WBC 12.0* 12.0* 12.5*  RBC 2.44* 2.24* 2.90*  HGB 7.5* 6.8* 8.9*  HCT 23.9* 21.6* 28.1*  MCV 98.0 96.4 96.9  MCH 30.7 30.4 30.7  MCHC 31.4 31.5 31.7  RDW 14.7 15.0 15.5  PLT 205 220 291    Cardiac EnzymesNo results for input(s): TROPONINI in the last 168 hours. No results for input(s): TROPIPOC in the last 168 hours.   BNPNo results for input(s): BNP, PROBNP in the last 168 hours.   DDimer No results for input(s): DDIMER in the last 168 hours.   Radiology    No results found.  Cardiac  Studies   none  Patient Profile     84 y.o. female with a hx of with a hx of CVA,HTN,HLD,CADs/p CABG withmechanicalaortic valve replacementon chronic coumadin(1997),chronic combined heart failure (EF 50-55%withgrade 1 diastolic dysfunction) and COPD on home O2 who is being followed for anticoagulation management at the request of Dr.Xu.   Assessment & Plan    1. R thigh hematoma Occurred while on Lovenox with coumadin post repair of him fracture. INR downtrending, 2.32 today. -given her INR trend, will ask for pharmacy assistance and plan to restart coumadin tonight. We will aim for the low end of INR 2-3 given her recent hematoma requiring transfusion.  -Would keep in house to avoid needing lovenox bridge again. She spiked her INR rapidly with coumadin loading before, so it is imperative to make sure she is on  a stable dose prior to discharge. She is exceeding high risk for complications of bleeding if she is discharge prior to a stable goal INR. -she should not be reversed unless she has a life threatening bleed, given the risk of thrombosis of her mechanical valve -she would also benefit from continued PT assistance to regain strength and ambulation with the thigh hematoma. -if INR stable, anticipate that the earliest she would be ready for discharge would be over the weekend (given 48 hour lag time with INR post coumadin dosing). Most likely, given her need for repeated transfusions, her CBC and INR should both be stable prior to discharge, so would expect 9/16 as a reasonable potential date. Would not discharge her if she has required transfusions within the last 24-48 hours prior to anticipated discharge.  2. S/p Mechanical aortic valve - Coumadin on hold for #1  3. Chronic combined CHF - Euvolemic. Lasix on hold. Benezapril on hold. Carvedilol continued  4. Acute anemia due to #1 - S/p additional transfusion yesterday for downtrending Hgb. Today Hgb improved to 8.9. No evidence of compartment syndrome.   Time Spent Directly with Patient: I have spent a total of 25 minutes with the patient reviewing hospital notes, telemetry, EKGs, labs and examining the patient as well as establishing an assessment and plan that was discussed personally with the patient.  > 50% of time was spent in direct patient care.  Length of Stay:  LOS: 4 days   Jodelle RedBridgette Raheem Kolbe, MD, PhD [facility]  [facility]   07/27/2018, 1:12 PM      For questions or updates, please contact [facility] Please consult [email or web address] for contact info under Cardiology/STEMI.

## 2018-07-27 NOTE — Progress Notes (Signed)
ANTICOAGULATION CONSULT NOTE - Initial Consult  Pharmacy Consult for Warfarin Indication: Mechanical Aortic valve  Allergies  Allergen Reactions  . Codeine Nausea And Vomiting  . Penicillins Other (See Comments)    Unknown reaction - listed on MAR Has patient had a PCN reaction causing immediate rash, facial/tongue/throat swelling, SOB or lightheadedness with hypotension: Unknown Has patient had a PCN reaction causing severe rash involving mucus membranes or skin necrosis: Unknown Has patient had a PCN reaction that required hospitalization: Unknown Has patient had a PCN reaction occurring within the last 10 years: Unknown If all of the above answers are "NO", then may proceed with Cephalosporin use.    Patient Measurements: Height: 5\' 2"  (157.5 cm) Weight: 115 lb 15.4 oz (52.6 kg) IBW/kg (Calculated) : 50.1  Heparin dosing weight 52.4 kg   Vital Signs: Temp: 98.6 F (37 C) (09/12 0448) Temp Source: Oral (09/12 0448) BP: 146/50 (09/12 0448) Pulse Rate: 80 (09/12 0448)  Labs: Recent Labs    07/25/18 0230 07/25/18 1508 07/26/18 0459 07/27/18 0548  HGB 7.5* 7.5* 6.8* 8.9*  HCT 23.0* 23.9* 21.6* 28.1*  PLT 164 205 220 291  LABPROT 34.3*  --  30.8* 25.3*  INR 3.43  --  2.99 2.32  CREATININE  --   --  1.09*  --     Estimated Creatinine Clearance: 27.7 mL/min (A) (by C-G formula based on SCr of 1.09 mg/dL (H)).   Medical History: Past Medical History:  Diagnosis Date  . Carotid artery stenosis    Without infarction  . Chronic combined systolic (congestive) and diastolic (congestive) heart failure (HCC)   . COPD (chronic obstructive pulmonary disease) (HCC)   . Coronary artery disease   . Heart valve replaced by other means   . Hypercholesterolemia    Pure  . Hypertension    Unspecified  . LBBB (left bundle branch block)   . Macular degeneration (senile) of retina, unspecified   . On home O2   . Postsurgical aortocoronary bypass status   . Stroke (HCC)   .  Transient global amnesia   . Unspecified hereditary and idiopathic peripheral neuropathy   . Unspecified vitamin D deficiency    Assessment: 84 yo female on Warfarin PTA for hx mech AVR + CVA. INR goal 2.2-3 per clinic notes. Was being bridged with Heparin - then transitioned to Enox 9/6. Admit Hgb 6.7 >> up to 9 with PRBC >> trended back down to 7.5. Noted R-thigh hematoma.   PTA dose 2.5 mg daily, received 2 doses of warf 4 mg at rehab (9/5 + 9/6) with quick jump in INR (peak 4.78 on 9/8).   INR 2.32 today. MD wishes for pharmacy to restart warfarin. As patient became supratherapeutic after 4mg  doses x 2, will restart previous 2.5mg  x 1 tonight.     Goal of Therapy:  INR 2-3 Monitor platelets by anticoagulation protocol: Yes   Plan:  Warfarin 2.5mg  PO x 1 tonight.  Daily INR Monitor for s/sx of bleeding.   Venia Riveron A. Jeanella CrazePierce, PharmD, BCPS Clinical Pharmacist [place] Pager: [phone] Please utilize Amion for appropriate phone number to reach the unit pharmacist [facility])   07/27/2018,8:57 AM

## 2018-07-28 LAB — CBC
HCT: 27.6 % — ABNORMAL LOW (ref 36.0–46.0)
Hemoglobin: 8.7 g/dL — ABNORMAL LOW (ref 12.0–15.0)
MCH: 30.5 pg (ref 26.0–34.0)
MCHC: 31.5 g/dL (ref 30.0–36.0)
MCV: 96.8 fL (ref 78.0–100.0)
PLATELETS: 353 10*3/uL (ref 150–400)
RBC: 2.85 MIL/uL — ABNORMAL LOW (ref 3.87–5.11)
RDW: 15 % (ref 11.5–15.5)
WBC: 12 10*3/uL — AB (ref 4.0–10.5)

## 2018-07-28 LAB — PROTIME-INR
INR: >10
INR: 2.28
PROTHROMBIN TIME: 25 s — AB (ref 11.4–15.2)
Prothrombin Time: >90 seconds — ABNORMAL HIGH (ref 11.4–15.2)

## 2018-07-28 MED ORDER — WARFARIN SODIUM 2.5 MG PO TABS
2.5000 mg | ORAL_TABLET | Freq: Once | ORAL | Status: AC
  Administered 2018-07-28: 2.5 mg via ORAL
  Filled 2018-07-28: qty 1

## 2018-07-28 NOTE — Progress Notes (Signed)
Physical Therapy Treatment Patient Details Name: [name] MRN: [id number] DOB: April 18, 1926 Today's Date: 07/28/2018    History of Present Illness 84yo female admitted from rehab with significant pain and swelling in her right thigh.  Labs were checked at the nursing home and she was found to have a worsening anemia as well as a supratherapeutic INR. She received R cannulated hip pinning on 07/18/18. PMH CHF, COPD, HTN, L BBB, macular degeneration, CVA, transient global amnesia, hx CABG     PT Comments    Patient continues to make slow and steady progress towards physical therapy goals. Ambulating 32 feet with walker and minimal assistance for balance and occasional walker negotiation. Chair follow utilized as patient fatigues quickly and requires seated rest break without much warning. States she has been performing HEP independently for lower extremity strengthening. SNF remains appropriate recommendation as patient continues to present as a high fall risk.     Follow Up Recommendations  SNF;Supervision for mobility/OOB     Equipment Recommendations  None recommended by PT    Recommendations for Other Services       Precautions / Restrictions Precautions Precautions: Fall Restrictions Weight Bearing Restrictions: Yes RLE Weight Bearing: Weight bearing as tolerated    Mobility  Bed Mobility Overal bed mobility: Needs Assistance Bed Mobility: Supine to Sit;Sit to Supine     Supine to sit: Min assist Sit to supine: Min assist   General bed mobility comments: min assist for RLE management  Transfers Overall transfer level: Needs assistance Equipment used: Rolling walker (2 wheeled) Transfers: Sit to/from Stand Sit to Stand: Min assist;Mod assist         General transfer comment: light min assist to boost up and cues for hand placement. when ambulating, had sudden onset of dizziness and required moderate assistance for transition from standing to sitting in recliner  which was behind her  Ambulation/Gait Ambulation/Gait assistance: +2 safety/equipment;Min assist Gait Distance (Feet): 34 Feet Assistive device: Rolling walker (2 wheeled) Gait Pattern/deviations: Step-to pattern;Narrow base of support;Decreased weight shift to right;Decreased stance time - right;Decreased stride length Gait velocity: decreased Gait velocity interpretation: <1.31 ft/sec, indicative of household ambulator General Gait Details: patient with improved upright posture this session. min cues for looking up. increased weightbearing also noted through RLE but continues with step to pattern. fatigues easily and required chair follow   Stairs             Wheelchair Mobility    Modified Rankin (Stroke Patients Only)       Balance Overall balance assessment: Needs assistance Sitting-balance support: Feet supported;Bilateral upper extremity supported Sitting balance-Leahy Scale: Fair       Standing balance-Leahy Scale: Poor Standing balance comment: reliant on bil UE support                            Cognition Arousal/Alertness: Awake/alert Behavior During Therapy: WFL for tasks assessed/performed Overall Cognitive Status: Within Functional Limits for tasks assessed                                        Exercises      General Comments General comments (skin integrity, edema, etc.): session on 2L O2      Pertinent Vitals/Pain Pain Assessment: Faces Faces Pain Scale: Hurts whole lot Pain Location: RLE increases with mobility  Pain Descriptors / Indicators: Aching;Sore;Grimacing;Guarding  Pain Intervention(s): Limited activity within patient's tolerance;Monitored during session;Premedicated before session    Home Living                      Prior Function            PT Goals (current goals can now be found in the care plan section) Acute Rehab PT Goals Patient Stated Goal: go back to [facility] and get stronger   Potential to Achieve Goals: Fair Progress towards PT goals: Progressing toward goals    Frequency    Min 3X/week      PT Plan Frequency needs to be updated    Co-evaluation              AM-PAC PT "6 Clicks" Daily Activity  Outcome Measure  Difficulty turning over in bed (including adjusting bedclothes, sheets and blankets)?: Unable Difficulty moving from lying on back to sitting on the side of the bed? : Unable Difficulty sitting down on and standing up from a chair with arms (e.g., wheelchair, bedside commode, etc,.)?: Unable Help needed moving to and from a bed to chair (including a wheelchair)?: A Little Help needed walking in hospital room?: A Lot Help needed climbing 3-5 steps with a railing? : Total 6 Click Score: 9    End of Session Equipment Utilized During Treatment: Gait belt Activity Tolerance: Patient tolerated treatment well Patient left: with family/visitor present;in bed;with call bell/phone within reach Nurse Communication: Mobility status PT Visit Diagnosis: Unsteadiness on feet (R26.81);Difficulty in walking, not elsewhere classified (R26.2);Muscle weakness (generalized) (M62.81);History of falling (Z91.81)     Time: [id number] PT Time Calculation (min) (ACUTE ONLY): 30 min  Charges:  $Gait Training: 8-22 mins $Therapeutic Activity: 8-22 mins                     Laurina Bustlearoline Militza Devery, PT, DPT Acute Rehabilitation Services Pager [phone] Office [phone]     Vanetta MuldersCarloine H Dorea Duff 07/28/2018, 5:01 PM

## 2018-07-28 NOTE — Clinical Social Work Note (Signed)
CSW reviewed Cardiologist note for today and it stated that: "If Hgb remains stable and INR therapeutic on a stable dose of coumadin, would aim to return to her SNF on 9/16." Call made to [place], Admissions [facility] and updated her on probable d/c date per note. Per Lyla SonCarrie they can take patient back on Monday. CSW will continue to follow, provide SW intervention services as needed and facilitate [facility] when medically stable.  Genelle BalVanessa Bronx Brogden, MSW, LCSW Licensed Clinical Social Worker Clinical Social Work Department [facility] [phone]

## 2018-07-28 NOTE — Care Management Important Message (Signed)
Important Message  Patient Details  Name: [name] MRN: [id number] Date of Birth: 1926/08/24   Medicare Important Message Given:  Yes    Alyjah Lovingood Stefan ChurchBratton 07/28/2018, 3:54 PM

## 2018-07-28 NOTE — Discharge Instructions (Signed)
Orthopaedic Trauma Service Discharge Instructions   General Discharge Instructions  WEIGHT BEARING STATUS: Weightbearing as tolerated R leg   RANGE OF MOTION/ACTIVITY: unrestricted range of motion R hip and knee   Wound Care: ok to leave R hip wound open to air and clean with soap and water

## 2018-07-28 NOTE — Progress Notes (Addendum)
Progress Note  Patient Name: Colleen CablesRuth E [name] Date of Encounter: 07/28/2018  Primary Cardiologist: Olga MillersBrian Crenshaw, MD   Subjective   Started coumadin, tolerating thus far. Pain in leg still bothersome, but trying to work with PT as much as she is able.  Inpatient Medications    Scheduled Meds: . sodium chloride   Intravenous Once  . atorvastatin  40 mg Oral QHS  . carvedilol  6.25 mg Oral BID WC  . docusate sodium  200 mg Oral QHS  . donepezil  10 mg Oral QHS  . polyethylene glycol  17 g Oral Daily  . pregabalin  75 mg Oral QODAY  . sodium chloride flush  3 mL Intravenous Q12H  . umeclidinium-vilanterol  1 puff Inhalation Daily  . warfarin  2.5 mg Oral ONCE-1800  . Warfarin - Pharmacist Dosing Inpatient   Does not apply q1800   Continuous Infusions: . sodium chloride     PRN Meds: sodium chloride, acetaminophen **OR** acetaminophen, ALPRAZolam, HYDROcodone-acetaminophen, levalbuterol, meclizine, ondansetron **OR** ondansetron (ZOFRAN) IV, phenol, sodium chloride flush   Vital Signs    Vitals:   07/27/18 2017 07/28/18 0441 07/28/18 0816 07/28/18 0915  BP: (!) 129/52 (!) 130/56 (!) 144/78   Pulse: 78 84 89   Resp: 17 18 18    Temp: 98.8 F (37.1 C) 98.9 F (37.2 C) 98.3 F (36.8 C)   TempSrc: Oral Oral Oral   SpO2: 98% 91% 97% 94%  Weight:      Height:        Intake/Output Summary (Last 24 hours) at 07/28/2018 1337 Last data filed at 07/28/2018 1100 Gross per 24 hour  Intake 540 ml  Output 300 ml  Net 240 ml   Filed Weights   07/23/18 0949 07/23/18 1733 07/25/18 2141  Weight: 45.6 kg 52.4 kg 52.6 kg    Telemetry   Not on telemetry - Personally Reviewed  ECG    No new - Personally Reviewed  Physical Exam   GEN: No acute distress.   Neck: supple, no JVD Cardiac: regular S1 and S2, no murmurs, rubs, or gallops. Crisp mechanical valve closure Respiratory: Clear to auscultation bilaterally. GI: Soft, nontender, non-distended. Bowel sounds normal MS:  Right thigh with 2+ swelling and tenderness at upper lateral aspect. Warm, palpable pulses distal, sensation intact Neuro:  Nonfocal, moves all limbs independently Psych: Normal affect   Labs    Chemistry Recent Labs  Lab 07/23/18 1051 07/24/18 0553 07/26/18 0459  NA 140 140 136  K 4.7 4.6 4.1  CL 103 105 102  CO2 30 28 28   GLUCOSE 125* 103* 100*  BUN 20 19 25*  CREATININE 1.00 0.99 1.09*  CALCIUM 8.7* 8.6* 8.1*  GFRNONAA 48* 49* 44*  GFRAA 56* 57* 51*  ANIONGAP 7 7 6      Hematology Recent Labs  Lab 07/26/18 0459 07/27/18 0548 07/28/18 0500  WBC 12.0* 12.5* 12.0*  RBC 2.24* 2.90* 2.85*  HGB 6.8* 8.9* 8.7*  HCT 21.6* 28.1* 27.6*  MCV 96.4 96.9 96.8  MCH 30.4 30.7 30.5  MCHC 31.5 31.7 31.5  RDW 15.0 15.5 15.0  PLT 220 291 353    Cardiac EnzymesNo results for input(s): TROPONINI in the last 168 hours. No results for input(s): TROPIPOC in the last 168 hours.   BNPNo results for input(s): BNP, PROBNP in the last 168 hours.   DDimer No results for input(s): DDIMER in the last 168 hours.   Radiology    No results found.  Cardiac Studies  none  Patient Profile     84 y.o. female with a hx of with a hx of CVA,HTN,HLD,CADs/p CABG withmechanicalaortic valve replacementon chronic coumadin(1997),chronic combined heart failure (EF 50-55%withgrade 1 diastolic dysfunction) and COPD on home O2 who is being followed for anticoagulation management at the request of Dr.Xu.   Assessment & Plan    1. R thigh hematoma Occurred while on Lovenox with coumadin post repair of him fracture.  Restarted coumadin last night, appreciate pharmacy assistance with dosing. Aim for low end of 2-3 range. Want the coumadin dose to be stable, Hgb stable prior to discharge given her prior complications with lovenox bridge and labile INR.  2. S/p Mechanical aortic valve - Coumadin per #1  3. Chronic combined CHF - Euvolemic. Lasix on hold.  4. Acute anemia due to #1 -  has received several transfusions, Hgb stable today.  If Hgb remains stable and INR therapeutic on a stable dose of coumadin, would aim to return to her SNF on 9/16. She is still very tender, coumadin was just restarted, and she may have enlargement in her hematoma/worsening blood loss anemia if her INR is not closely monitored.   Time Spent Directly with Patient: I have spent a total of 25 minutes with the patient reviewing hospital notes, telemetry, EKGs, labs and examining the patient as well as establishing an assessment and plan that was discussed personally with the patient.  > 50% of time was spent in direct patient care.  Length of Stay:  LOS: 5 days   Jodelle Red, MD, PhD [facility]  [facility]   07/28/2018, 1:37 PM      For questions or updates, please contact [facility] Please consult [email or web address] for contact info under Cardiology/STEMI.

## 2018-07-28 NOTE — Progress Notes (Signed)
CRITICAL VALUE ALERT  Critical Value:  INR >10  Date & Time Notied:  07/28/18 @0703   Provider Notified: Chiu,MD 07/28/18 @ 0730  Orders Received/Actions taken: in epic

## 2018-07-28 NOTE — Progress Notes (Signed)
ANTICOAGULATION CONSULT NOTE - Initial Consult  Pharmacy Consult for Warfarin Indication: Mechanical Aortic valve  Allergies  Allergen Reactions  . Codeine Nausea And Vomiting  . Penicillins Other (See Comments)    Unknown reaction - listed on MAR Has patient had a PCN reaction causing immediate rash, facial/tongue/throat swelling, SOB or lightheadedness with hypotension: Unknown Has patient had a PCN reaction causing severe rash involving mucus membranes or skin necrosis: Unknown Has patient had a PCN reaction that required hospitalization: Unknown Has patient had a PCN reaction occurring within the last 10 years: Unknown If all of the above answers are "NO", then may proceed with Cephalosporin use.    Patient Measurements: Height: 5\' 2"  (157.5 cm) Weight: 115 lb 15.4 oz (52.6 kg) IBW/kg (Calculated) : 50.1  Heparin dosing weight 52.4 kg   Vital Signs: Temp: 98.9 F (37.2 C) (09/13 0441) Temp Source: Oral (09/13 0441) BP: 130/56 (09/13 0441) Pulse Rate: 84 (09/13 0441)  Labs: Recent Labs    07/26/18 0459 07/27/18 0548 07/28/18 0500 07/28/18 0729  HGB 6.8* 8.9* 8.7*  --   HCT 21.6* 28.1* 27.6*  --   PLT 220 291 353  --   LABPROT 30.8* 25.3* >90.0* 25.0*  INR 2.99 2.32 >10.00* 2.28  CREATININE 1.09*  --   --   --     Estimated Creatinine Clearance: 27.7 mL/min (A) (by C-G formula based on SCr of 1.09 mg/dL (H)).   Medical History: Past Medical History:  Diagnosis Date  . Carotid artery stenosis    Without infarction  . Chronic combined systolic (congestive) and diastolic (congestive) heart failure (HCC)   . COPD (chronic obstructive pulmonary disease) (HCC)   . Coronary artery disease   . Heart valve replaced by other means   . Hypercholesterolemia    Pure  . Hypertension    Unspecified  . LBBB (left bundle branch block)   . Macular degeneration (senile) of retina, unspecified   . On home O2   . Postsurgical aortocoronary bypass status   . Stroke (HCC)    . Transient global amnesia   . Unspecified hereditary and idiopathic peripheral neuropathy   . Unspecified vitamin D deficiency    Assessment: 84 yo female on Warfarin PTA for hx mech AVR + CVA. INR goal 2.2-3 per clinic notes. Was being bridged with Heparin - then transitioned to Enox 9/6. Admit Hgb 6.7 >> up to 9 with PRBC >> trended back down to 7.5. Noted R-thigh hematoma.   PTA dose 2.5 mg daily, received 2 doses of warf 4 mg at rehab (9/5 + 9/6) with quick jump in INR (peak 4.78 on 9/8).   INR 2.28 today. Previous value of >10 was a lab error.     Goal of Therapy:  INR 2-3 Monitor platelets by anticoagulation protocol: Yes   Plan:  Warfarin 2.5mg  PO x 1 tonight.  Daily INR Monitor for s/sx of bleeding.   Jimma Ortman A. Jeanella CrazePierce, PharmD, BCPS Clinical Pharmacist [place] Pager: [phone] Please utilize Amion for appropriate phone number to reach the unit pharmacist [facility])   07/28/2018,8:14 AM

## 2018-07-28 NOTE — Progress Notes (Signed)
PROGRESS NOTE    Colleen CablesRuth E [name]  ZOX:[id number] DOB: 1925-12-12 DOA: 07/23/2018 PCP: Kari BaarsHawkins, Edward, MD    Brief Narrative:  [age] y.o. 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  Hospital course was complicated by development of postop anemia.  She received 2 units of PRBC.  Patient was discharged to nursing facility.  INR was 1.7 the time of discharge, so she was discharged on Coumadin and Lovenox bridge.  Over the past few days, she started to develop significant swelling in her right lower extremity.  She did not have any fever, cough, shortness of breath.  She had significant pain and swelling in her right thigh.  She denies any new numbness or tingling in her right leg.  Labs were checked at the nursing home and she was found to have a worsening anemia as well as a supratherapeutic INR.  She comes back to the emergency room for evaluation  ED Course: Vitals were noted to be stable.  She is not tachycardic.  Blood pressure stable.  INR is elevated at 4.7.  Hemoglobin is down to 6.7.  Serum chemistry is unrevealing.  CT of the right femur, shows anterior compartmental hematoma, probably in the vastus intermedius muscle, measuring about 700 cm in volume.  No active extravasation of contrast is noted.  Patient was ordered 2 units of PRBC.  She is been referred for admission.  Assessment & Plan:   Active Problems:   Hyperlipidemia   Essential hypertension   S/P AVR (aortic valve replacement)   Chronic respiratory failure with hypoxia, on home O2 therapy (HCC)   Hematoma of thigh, right, initial encounter   Chronic combined systolic and diastolic CHF (congestive heart failure) (HCC)  Supratherapeutic INR   Acute blood loss anemia   CKD (chronic kidney disease) stage 3, GFR 30-59 ml/min (HCC)   COPD (chronic obstructive pulmonary disease) (HCC)   Hematoma of right thigh   Pressure injury of skin  acute anemia/right Thigh hematoma in the setting of supertherapeutic INR (INR 4.78 on presentation) -hgb on presentation is 5.8, s/p prbc transfusion x2 on 9/8 -ortho consulted, conservative management for thigh hematoma noted -cardiology consulted for anticoagulation management in the setting of mechanical valve with recs for keeping INR on lower range between 2-3 -Resumed coumadin 9/12. INR down to 2.2 today. Continue coumadin dosing per pharmacy -Goal for stabilizing INR prior to d/c. As coumadin takes 3-5 days to take effect, would need to monitor in house incase lovenox bridge needed. Otherwise, pt may develop life-threatening clot if INR subtherapeutic or life-threatening bleed if super therapeutic  R subacute non-displaced hip fracture-  S/p RIGHT CANNULATED HIP PINNING On 9/3 with cardiology for preop eval postop weightbearing status, wound care, pain management per ortho -currently stable at this time  Leukocytosis: -Question stress reaction -WBC trends reviewed, stable -Will recheck cbc in AM  CAD status post mechanical aortic valve placement. - chronic combined systolic/diastolic congestive  euvolemic at this time Continue comadin dosing per pharmacy Cardiology consulted, coumadin and lasix management per cardiology INR 2.2 this AM  CKDIII  renal function stable at baseline  HTN - Continue Coreg, hold acei for now -BP reviewed, stable   HLD - Continue Lipitor as tolerated  Chronic respiratory failure  from COPD, on home O2 4liters at baseline- this is due to underlying COPD, currently compensated, continue home nebulized medications and as needed nebulizer, continue oxygen.  -No wheezing at this time, stable  Dementia - Continue  Aricept is at risk for delirium.  -Continue to avoid over medication with narcotics and benzodiazepines.  Severe malnutrition in context of chronic illness, Underweight -Nutrition consulted  Stage 1 sacral pressure ulcer  2.5 cm x 2.5 cmpresented on admission Pressure off loading Continue routine care  DVT prophylaxis: Coumadin Code Status: DNR Family Communication: Pt in room, family at bedside Disposition Plan: Uncertain at this time  Consultants:   Orthopedic Surgery  Cardiology  Procedures:     Antimicrobials: Anti-infectives (From admission, onward)   None      Subjective: Without complaints at present  Objective: Vitals:   07/27/18 2017 07/28/18 0441 07/28/18 0816 07/28/18 0915  BP: (!) 129/52 (!) 130/56 (!) 144/78   Pulse: 78 84 89   Resp: 17 18 18    Temp: 98.8 F (37.1 C) 98.9 F (37.2 C) 98.3 F (36.8 C)   TempSrc: Oral Oral Oral   SpO2: 98% 91% 97% 94%  Weight:      Height:        Intake/Output Summary (Last 24 hours) at 07/28/2018 1654 Last data filed at 07/28/2018 1100 Gross per 24 hour  Intake 540 ml  Output 300 ml  Net 240 ml   Filed Weights   07/23/18 0949 07/23/18 1733 07/25/18 2141  Weight: 45.6 kg 52.4 kg 52.6 kg    Examination: General exam: Conversant, in no acute distress Respiratory system: normal chest rise, clear, no audible wheezing  Data Reviewed: I have personally reviewed following labs and imaging studies  CBC: Recent Labs  Lab 07/22/18 0830  07/23/18 1051  07/25/18 0230 07/25/18 1508 07/26/18 0459 07/27/18 0548 07/28/18 0500  WBC 9.8   < > 12.0*   < > 12.8* 12.0* 12.0* 12.5* 12.0*  NEUTROABS 7.6  --  9.2*  --   --   --   --   --   --   HGB 7.4*   < > 6.7*   < > 7.5* 7.5* 6.8* 8.9* 8.7*  HCT 23.4*   < > 20.9*   < > 23.0* 23.9* 21.6* 28.1* 27.6*  MCV 94.7   < > 96.8   < > 95.4 98.0 96.4 96.9 96.8  PLT 195   < > 175   < > 164 205 220 291 353   < > = values in this interval not displayed.   Basic  Metabolic Panel: Recent Labs  Lab 07/22/18 0830 07/23/18 0840 07/23/18 1050 07/23/18 1051 07/24/18 0553 07/26/18 0459  NA 141 142 139 140 140 136  K 4.3 4.8 4.6 4.7 4.6 4.1  CL 104 104 100 103 105 102  CO2 30 33*  --  30 28 28   GLUCOSE 93 92 120* 125* 103* 100*  BUN 21 19 20 20 19  25*  CREATININE 0.85 0.88 1.10* 1.00 0.99 1.09*  CALCIUM 8.9 8.9  --  8.7* 8.6* 8.1*   GFR: Estimated Creatinine Clearance: 27.7 mL/min (A) (by C-G formula based on SCr of 1.09 mg/dL (H)). Liver Function Tests: No results for input(s): AST, ALT, ALKPHOS, BILITOT, PROT, ALBUMIN in the last 168 hours. No results for input(s): LIPASE, AMYLASE in the last 168 hours. No results for input(s): AMMONIA in the last 168 hours. Coagulation Profile: Recent Labs  Lab 07/25/18 0230 07/26/18 0459 07/27/18 0548 07/28/18  0500 07/28/18 0729  INR 3.43 2.99 2.32 >10.00* 2.28   Cardiac Enzymes: No results for input(s): CKTOTAL, CKMB, CKMBINDEX, TROPONINI in the last 168 hours. BNP (last 3 results) No results for input(s): PROBNP in the last 8760 hours. HbA1C: No results for input(s): HGBA1C in the last 72 hours. CBG: No results for input(s): GLUCAP in the last 168 hours. Lipid Profile: No results for input(s): CHOL, HDL, LDLCALC, TRIG, CHOLHDL, LDLDIRECT in the last 72 hours. Thyroid Function Tests: No results for input(s): TSH, T4TOTAL, FREET4, T3FREE, THYROIDAB in the last 72 hours. Anemia Panel: No results for input(s): VITAMINB12, FOLATE, FERRITIN, TIBC, IRON, RETICCTPCT in the last 72 hours. Sepsis Labs: No results for input(s): PROCALCITON, LATICACIDVEN in the last 168 hours.  No results found for this or any previous visit (from the past 240 hour(s)).   Radiology Studies: No results found.  Scheduled Meds: . sodium chloride   Intravenous Once  . atorvastatin  40 mg Oral QHS  . carvedilol  6.25 mg Oral BID WC  . docusate sodium  200 mg Oral QHS  . donepezil  10 mg Oral QHS  . polyethylene  glycol  17 g Oral Daily  . pregabalin  75 mg Oral QODAY  . sodium chloride flush  3 mL Intravenous Q12H  . umeclidinium-vilanterol  1 puff Inhalation Daily  . warfarin  2.5 mg Oral ONCE-1800  . Warfarin - Pharmacist Dosing Inpatient   Does not apply q1800   Continuous Infusions: . sodium chloride       LOS: 5 days   Rickey Barbara, MD [facility] Pager On Amion  If 7PM-7AM, please contact night-coverage 07/28/2018, 4:54 PM

## 2018-07-29 LAB — CBC
HCT: 26.1 % — ABNORMAL LOW (ref 36.0–46.0)
Hemoglobin: 8.1 g/dL — ABNORMAL LOW (ref 12.0–15.0)
MCH: 30.1 pg (ref 26.0–34.0)
MCHC: 31 g/dL (ref 30.0–36.0)
MCV: 97 fL (ref 78.0–100.0)
PLATELETS: 383 10*3/uL (ref 150–400)
RBC: 2.69 MIL/uL — AB (ref 3.87–5.11)
RDW: 14.8 % (ref 11.5–15.5)
WBC: 11.3 10*3/uL — AB (ref 4.0–10.5)

## 2018-07-29 LAB — PROTIME-INR
INR: 2
PROTHROMBIN TIME: 22.5 s — AB (ref 11.4–15.2)

## 2018-07-29 MED ORDER — WARFARIN SODIUM 3 MG PO TABS
3.0000 mg | ORAL_TABLET | Freq: Once | ORAL | Status: AC
  Administered 2018-07-29: 3 mg via ORAL
  Filled 2018-07-29: qty 1

## 2018-07-29 NOTE — Progress Notes (Signed)
ANTICOAGULATION CONSULT NOTE - Initial Consult  Pharmacy Consult for Warfarin Indication: Mechanical Aortic valve  Allergies  Allergen Reactions  . Codeine Nausea And Vomiting  . Penicillins Other (See Comments)    Unknown reaction - listed on MAR Has patient had a PCN reaction causing immediate rash, facial/tongue/throat swelling, SOB or lightheadedness with hypotension: Unknown Has patient had a PCN reaction causing severe rash involving mucus membranes or skin necrosis: Unknown Has patient had a PCN reaction that required hospitalization: Unknown Has patient had a PCN reaction occurring within the last 10 years: Unknown If all of the above answers are "NO", then may proceed with Cephalosporin use.    Patient Measurements: Height: 5\' 2"  (157.5 cm) Weight: 115 lb 15.4 oz (52.6 kg) IBW/kg (Calculated) : 50.1  Heparin dosing weight 52.4 kg   Vital Signs: Temp: 98.1 F (36.7 C) (09/14 1008) Temp Source: Oral (09/14 1008) BP: 90/49 (09/14 1010) Pulse Rate: 70 (09/14 1010)  Labs: Recent Labs    07/27/18 0548 07/28/18 0500 07/28/18 0729 07/29/18 0536  HGB 8.9* 8.7*  --  8.1*  HCT 28.1* 27.6*  --  26.1*  PLT 291 353  --  383  LABPROT 25.3* >90.0* 25.0* 22.5*  INR 2.32 >10.00* 2.28 2.00    Estimated Creatinine Clearance: 27.7 mL/min (A) (by C-G formula based on SCr of 1.09 mg/dL (H)).   Medical History: Past Medical History:  Diagnosis Date  . Carotid artery stenosis    Without infarction  . Chronic combined systolic (congestive) and diastolic (congestive) heart failure (HCC)   . COPD (chronic obstructive pulmonary disease) (HCC)   . Coronary artery disease   . Heart valve replaced by other means   . Hypercholesterolemia    Pure  . Hypertension    Unspecified  . LBBB (left bundle branch block)   . Macular degeneration (senile) of retina, unspecified   . On home O2   . Postsurgical aortocoronary bypass status   . Stroke (HCC)   . Transient global amnesia   .  Unspecified hereditary and idiopathic peripheral neuropathy   . Unspecified vitamin D deficiency    Assessment: 84 yo female on Warfarin PTA for hx mech AVR + CVA. INR goal 2.2-3 per clinic notes. Was being bridged with Heparin - then transitioned to Enox 9/6. Admit Hgb 6.7 >> up to 9 with PRBC >> trended back down to 7.5. Noted R-thigh hematoma.   PTA dose 2.5 mg daily, received 2 doses of warf 4 mg at rehab (9/5 + 9/6) with quick jump in INR (peak 4.78 on 9/8). 9/13 INR of >10 was a lab error.  INR just within therapeutic range at 2.0 after two doses of warfarin 2.5 mg.    Goal of Therapy:  INR 2-3 Monitor platelets by anticoagulation protocol: Yes   Plan:  Warfarin 3 mg PO x 1 tonight.  Daily INR Monitor for s/sx of bleeding.   Ladell PierBrooke Tyreona Panjwani, PharmD Pharmacy Resident Clinical Phone for 07/29/2018 until 3:30pm: [phone] If after 3:30pm, please call main pharmacy at [phone] 07/29/2018 12:10 PM

## 2018-07-29 NOTE — Progress Notes (Addendum)
Progress Note  Patient Name: [name] Date of Encounter: 07/29/2018  Primary Cardiologist: [name], MD   Subjective   Having pain in right foot, like her arthritis flare up. Fatigued. Otherwise doing well. Leg pain stable.  Inpatient Medications    Scheduled Meds: . sodium chloride   Intravenous Once  . atorvastatin  40 mg Oral QHS  . carvedilol  6.25 mg Oral BID WC  . docusate sodium  200 mg Oral QHS  . donepezil  10 mg Oral QHS  . polyethylene glycol  17 g Oral Daily  . pregabalin  75 mg Oral QODAY  . sodium chloride flush  3 mL Intravenous Q12H  . umeclidinium-vilanterol  1 puff Inhalation Daily  . Warfarin - Pharmacist Dosing Inpatient   Does not apply q1800   Continuous Infusions: . sodium chloride     PRN Meds: sodium chloride, acetaminophen **OR** acetaminophen, ALPRAZolam, HYDROcodone-acetaminophen, levalbuterol, meclizine, ondansetron **OR** ondansetron (ZOFRAN) IV, phenol, sodium chloride flush   Vital Signs    Vitals:   07/28/18 1738 07/28/18 1946 07/29/18 0859 07/29/18 1008  BP: (!) 141/62 (!) 139/53  (!) 85/43  Pulse: 67 70  67  Resp: 16 20  18   Temp: 98.4 F (36.9 C) 98.7 F (37.1 C)  98.1 F (36.7 C)  TempSrc: Oral Oral  Oral  SpO2: 100% 98% 97% 98%  Weight:      Height:        Intake/Output Summary (Last 24 hours) at 07/29/2018 1016 Last data filed at 07/29/2018 0835 Gross per 24 hour  Intake 360 ml  Output 650 ml  Net -290 ml   Filed Weights   07/23/18 0949 07/23/18 1733 07/25/18 2141  Weight: 45.6 kg 52.4 kg 52.6 kg    Telemetry   Not on telemetry - Personally Reviewed  ECG    No new - Personally Reviewed  Physical Exam   GEN: No acute distress.   Neck: supple, no JVD Cardiac: regular S1 and S2, no murmurs, rubs, or gallops. Crisp mechanical valve closure Respiratory: Clear to auscultation bilaterally. GI: Soft, nontender, non-distended. Bowel sounds normal MS: Right thigh with 2+ swelling and tenderness at  upper lateral aspect. Warm, palpable pulses distal, sensation intact. Right distal foot and toes without erythema or swelling Neuro:  Nonfocal, moves all limbs independently Psych: Normal affect   Labs    Chemistry Recent Labs  Lab 07/23/18 1051 07/24/18 0553 07/26/18 0459  NA 140 140 136  K 4.7 4.6 4.1  CL 103 105 102  CO2 30 28 28   GLUCOSE 125* 103* 100*  BUN 20 19 25*  CREATININE 1.00 0.99 1.09*  CALCIUM 8.7* 8.6* 8.1*  GFRNONAA 48* 49* 44*  GFRAA 56* 57* 51*  ANIONGAP 7 7 6      Hematology Recent Labs  Lab 07/27/18 0548 07/28/18 0500 07/29/18 0536  WBC 12.5* 12.0* 11.3*  RBC 2.90* 2.85* 2.69*  HGB 8.9* 8.7* 8.1*  HCT 28.1* 27.6* 26.1*  MCV 96.9 96.8 97.0  MCH 30.7 30.5 30.1  MCHC 31.7 31.5 31.0  RDW 15.5 15.0 14.8  PLT 291 353 383    Cardiac EnzymesNo results for input(s): TROPONINI in the last 168 hours. No results for input(s): TROPIPOC in the last 168 hours.   BNPNo results for input(s): BNP, PROBNP in the last 168 hours.   DDimer No results for input(s): DDIMER in the last 168 hours.   Radiology    No results found.  Cardiac Studies   none  Patient  Profile     84 y.o. female with a hx of with a hx of CVA,HTN,HLD,CADs/p CABG withmechanicalaortic valve replacementon chronic coumadin(1997),chronic combined heart failure (EF 50-55%withgrade 1 diastolic dysfunction) and COPD on home O2 who is being followed for anticoagulation management at the request of Dr.Xu.   Assessment & Plan    1. R thigh hematoma Occurred while on Lovenox with coumadin post repair of him fracture. INR downtrending, 2.00 today. Restarted coumadin 9/12, appreciate pharmacy assistance with dosing. Aim for low end of 2-3 range. Want the coumadin dose to be stable, Hgb stable prior to discharge given her prior complications with lovenox bridge and labile INR.  2. S/p Mechanical aortic valve - Coumadin per #1  3. Chronic combined CHF - Euvolemic. Lasix on  hold.  4. Acute anemia due to #1 - has received several transfusions, Hgb slightly downtrending today.  5. Foot pain: states that she typically receives steroids with these flares. Given the potential of labile INRs, I would prefer to manage conservatively if possible. Likely exacerbated by her increasing PT, but this is crucial to regaining her strength. Conservative pain management, steroids only if not managed with pain medications (and recommend gentle treatment of pain given her age and frailty).  If Hgb remains stable and INR therapeutic on a stable dose of coumadin, would aim to return to her SNF on 9/16. She is still very tender, coumadin was just restarted, and she may have enlargement in her hematoma/worsening blood loss anemia if her INR is not closely monitored.   Time Spent Directly with Patient: I have spent a total of 15 minutes with the patient reviewing hospital notes, telemetry, EKGs, labs and examining the patient as well as establishing an assessment and plan that was discussed personally with the patient.  > 50% of time was spent in direct patient care.  Length of Stay:  LOS: 6 days   Jodelle RedBridgette Christin Moline, MD, PhD [facility]  [facility]   07/29/2018, 10:16 AM    For questions or updates, please contact [facility] Please consult [email or web address] for contact info under Cardiology/STEMI.

## 2018-07-29 NOTE — Progress Notes (Signed)
PROGRESS NOTE    [name]  WUJ:[id number] DOB: 12-03-1925 DOA: 07/23/2018 PCP: Kari Baars, MD    Brief Narrative:  84 y.o. 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  Hospital course was complicated by development of postop anemia.  She received 2 units of PRBC.  Patient was discharged to nursing facility.  INR was 1.7 the time of discharge, so she was discharged on Coumadin and Lovenox bridge.  Over the past few days, she started to develop significant swelling in her right lower extremity.  She did not have any fever, cough, shortness of breath.  She had significant pain and swelling in her right thigh.  She denies any new numbness or tingling in her right leg.  Labs were checked at the nursing home and she was found to have a worsening anemia as well as a supratherapeutic INR.  She comes back to the emergency room for evaluation  ED Course: Vitals were noted to be stable.  She is not tachycardic.  Blood pressure stable.  INR is elevated at 4.7.  Hemoglobin is down to 6.7.  Serum chemistry is unrevealing.  CT of the right femur, shows anterior compartmental hematoma, probably in the vastus intermedius muscle, measuring about 700 cm in volume.  No active extravasation of contrast is noted.  Patient was ordered 2 units of PRBC.  She is been referred for admission.  Assessment & Plan:   Active Problems:   Hyperlipidemia   Essential hypertension   S/P AVR (aortic valve replacement)   Chronic respiratory failure with hypoxia, on home O2 therapy (HCC)   Hematoma of thigh, right, initial encounter   Chronic combined systolic and diastolic CHF (congestive heart failure) (HCC)  Supratherapeutic INR   Acute blood loss anemia   CKD (chronic kidney disease) stage 3, GFR 30-59 ml/min (HCC)   COPD (chronic obstructive pulmonary disease) (HCC)   Hematoma of right thigh   Pressure injury of skin  acute anemia/right Thigh hematoma in the setting of supertherapeutic INR (INR 4.78 on presentation) with acute blood loss anemia -hgb on presentation is 5.8, s/p prbc transfusion x2 on 9/8 -ortho consulted, conservative management for thigh hematoma noted -cardiology consulted for anticoagulation management in the setting of mechanical valve with recs for keeping INR on lower range between 2-3 -Resumed coumadin 9/12. INR down to 2.0 today. Continue coumadin dosing per pharmacy -Goal for stabilizing INR prior to d/c. As coumadin takes 3-5 days to take effect, would need to monitor in house incase lovenox bridge needed. Otherwise, pt may develop life-threatening clot if INR subtherapeutic or life-threatening bleed if super therapeutic -Hgb down to 8.1 today. Will repeat CBC in AM  R subacute non-displaced hip fracture-  S/p RIGHT CANNULATED HIP PINNING On 9/3 with cardiology for preop eval postop weightbearing status, wound care, pain management per ortho -currently stable at this time  Leukocytosis: -Question stress reaction -WBC trends reviewed, stable -Will repeat cbc in AM  CAD status post mechanical aortic valve placement. - chronic combined systolic/diastolic congestive  euvolemic at this time Continue comadin dosing per pharmacy Cardiology consulted, coumadin and lasix management per cardiology INR 2.0 this AM. Cont coumadin as tolerated  CKDIII  renal function stable at baseline  HTN - Continue Coreg,  hold acei for now -BP reviewed, soft BP this AM -will place hold parameters on coreg   HLD - Continue Lipitor as tolerated  Chronic respiratory failure from COPD, on home O2 4liters at baseline- this is due to underlying COPD, currently  compensated, continue home nebulized medications and as needed nebulizer, continue oxygen.  -No wheezing at this time, stable  Dementia - Continue Aricept is at risk for delirium.  -Continue to avoid over medication with narcotics and benzodiazepines.  Severe malnutrition in context of chronic illness, Underweight -Nutrition consulted  Stage 1 sacral pressure ulcer  2.5 cm x 2.5 cmpresented on admission Pressure off loading Continue routine care  DVT prophylaxis: Coumadin Code Status: DNR Family Communication: Pt in room, family at bedside Disposition Plan: Uncertain at this time  Consultants:   Orthopedic Surgery  Cardiology  Procedures:     Antimicrobials: Anti-infectives (From admission, onward)   None      Subjective: No complaints at present  Objective: Vitals:   07/28/18 1946 07/29/18 0859 07/29/18 1008 07/29/18 1010  BP: (!) 139/53  (!) 85/43 (!) 90/49  Pulse: 70  67 70  Resp: 20  18   Temp: 98.7 F (37.1 C)  98.1 F (36.7 C)   TempSrc: Oral  Oral   SpO2: 98% 97% 98%   Weight:      Height:        Intake/Output Summary (Last 24 hours) at 07/29/2018 1413 Last data filed at 07/29/2018 0948 Gross per 24 hour  Intake 480 ml  Output 550 ml  Net -70 ml   Filed Weights   07/23/18 0949 07/23/18 1733 07/25/18 2141  Weight: 45.6 kg 52.4 kg 52.6 kg    Examination: General exam: Conversant, in no acute distress Respiratory system: normal chest rise, clear, no audible wheezing Cardiovascular system: regular rhythm, s1-s2 Gastrointestinal system: Nondistended, nontender, pos BS Central nervous system: No seizures, no tremors Extremities: No cyanosis, no joint deformities Skin: No rashes, no pallor Psychiatry: Affect normal // no auditory hallucinations   Data Reviewed: I have personally reviewed following labs and imaging studies  CBC: Recent Labs  Lab 07/23/18 1051  07/25/18 1508 07/26/18 0459 07/27/18 0548 07/28/18 0500  07/29/18 0536  WBC 12.0*   < > 12.0* 12.0* 12.5* 12.0* 11.3*  NEUTROABS 9.2*  --   --   --   --   --   --   HGB 6.7*   < > 7.5* 6.8* 8.9* 8.7* 8.1*  HCT 20.9*   < > 23.9* 21.6* 28.1* 27.6* 26.1*  MCV 96.8   < > 98.0 96.4 96.9 96.8 97.0  PLT 175   < > 205 220 291 353 383   < > = values in this interval not displayed.   Basic Metabolic Panel: Recent Labs  Lab 07/23/18 0840 07/23/18 1050 07/23/18 1051 07/24/18 0553 07/26/18 0459  NA 142 139 140 140 136  K 4.8 4.6 4.7 4.6 4.1  CL 104 100 103 105 102  CO2 33*  --  30 28 28   GLUCOSE 92 120* 125* 103* 100*  BUN 19 20 20 19  25*  CREATININE 0.88 1.10* 1.00 0.99 1.09*  CALCIUM 8.9  --  8.7* 8.6* 8.1*   GFR: Estimated Creatinine Clearance: 27.7 mL/min (A) (by C-G formula based on SCr of 1.09 mg/dL (H)). Liver Function Tests: No results for input(s): AST, ALT, ALKPHOS, BILITOT, PROT, ALBUMIN in the last 168 hours. No results for input(s): LIPASE, AMYLASE in the last 168 hours. No results for  input(s): AMMONIA in the last 168 hours. Coagulation Profile: Recent Labs  Lab 07/26/18 0459 07/27/18 0548 07/28/18 0500 07/28/18 0729 07/29/18 0536  INR 2.99 2.32 >10.00* 2.28 2.00   Cardiac Enzymes: No results for input(s): CKTOTAL, CKMB, CKMBINDEX, TROPONINI in the last 168 hours. BNP (last 3 results) No results for input(s): PROBNP in the last 8760 hours. HbA1C: No results for input(s): HGBA1C in the last 72 hours. CBG: No results for input(s): GLUCAP in the last 168 hours. Lipid Profile: No results for input(s): CHOL, HDL, LDLCALC, TRIG, CHOLHDL, LDLDIRECT in the last 72 hours. Thyroid Function Tests: No results for input(s): TSH, T4TOTAL, FREET4, T3FREE, THYROIDAB in the last 72 hours. Anemia Panel: No results for input(s): VITAMINB12, FOLATE, FERRITIN, TIBC, IRON, RETICCTPCT in the last 72 hours. Sepsis Labs: No results for input(s): PROCALCITON, LATICACIDVEN in the last 168 hours.  No results found for this or any previous  visit (from the past 240 hour(s)).   Radiology Studies: No results found.  Scheduled Meds: . sodium chloride   Intravenous Once  . atorvastatin  40 mg Oral QHS  . carvedilol  6.25 mg Oral BID WC  . docusate sodium  200 mg Oral QHS  . donepezil  10 mg Oral QHS  . polyethylene glycol  17 g Oral Daily  . pregabalin  75 mg Oral QODAY  . sodium chloride flush  3 mL Intravenous Q12H  . umeclidinium-vilanterol  1 puff Inhalation Daily  . warfarin  3 mg Oral ONCE-1800  . Warfarin - Pharmacist Dosing Inpatient   Does not apply q1800   Continuous Infusions: . sodium chloride       LOS: 6 days   Rickey BarbaraStephen Maybree Riling, MD [facility] Pager On Amion  If 7PM-7AM, please contact night-coverage 07/29/2018, 2:13 PM

## 2018-07-30 DIAGNOSIS — D62 Acute posthemorrhagic anemia: Secondary | ICD-10-CM

## 2018-07-30 LAB — CBC
HEMATOCRIT: 29.1 % — AB (ref 36.0–46.0)
HEMOGLOBIN: 8.9 g/dL — AB (ref 12.0–15.0)
MCH: 30.8 pg (ref 26.0–34.0)
MCHC: 30.6 g/dL (ref 30.0–36.0)
MCV: 100.7 fL — AB (ref 78.0–100.0)
Platelets: 485 10*3/uL — ABNORMAL HIGH (ref 150–400)
RBC: 2.89 MIL/uL — ABNORMAL LOW (ref 3.87–5.11)
RDW: 14.8 % (ref 11.5–15.5)
WBC: 11.2 10*3/uL — ABNORMAL HIGH (ref 4.0–10.5)

## 2018-07-30 LAB — PROTIME-INR
INR: 2.4
Prothrombin Time: 26 seconds — ABNORMAL HIGH (ref 11.4–15.2)

## 2018-07-30 MED ORDER — WARFARIN SODIUM 2 MG PO TABS
2.0000 mg | ORAL_TABLET | Freq: Once | ORAL | Status: AC
  Administered 2018-07-30: 2 mg via ORAL
  Filled 2018-07-30: qty 1

## 2018-07-30 MED ORDER — HYDROCORTISONE 2.5 % RE CREA
TOPICAL_CREAM | Freq: Three times a day (TID) | RECTAL | Status: DC
  Administered 2018-07-30 – 2018-08-01 (×5): via RECTAL
  Filled 2018-07-30 (×2): qty 28.35

## 2018-07-30 NOTE — Progress Notes (Signed)
PROGRESS NOTE    Colleen CablesRuth E [name]  ZOX:[id number] DOB: March 10, 1926 DOA: 07/23/2018 PCP: Kari BaarsHawkins, Edward, MD    Brief Narrative:  84 y.o. 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  Hospital course was complicated by development of postop anemia.  She received 2 units of PRBC.  Patient was discharged to nursing facility.  INR was 1.7 the time of discharge, so she was discharged on Coumadin and Lovenox bridge.  Over the past few days, she started to develop significant swelling in her right lower extremity.  She did not have any fever, cough, shortness of breath.  She had significant pain and swelling in her right thigh.  She denies any new numbness or tingling in her right leg.  Labs were checked at the nursing home and she was found to have a worsening anemia as well as a supratherapeutic INR.  She comes back to the emergency room for evaluation  ED Course: Vitals were noted to be stable.  She is not tachycardic.  Blood pressure stable.  INR is elevated at 4.7.  Hemoglobin is down to 6.7.  Serum chemistry is unrevealing.  CT of the right femur, shows anterior compartmental hematoma, probably in the vastus intermedius muscle, measuring about 700 cm in volume.  No active extravasation of contrast is noted.  Patient was ordered 2 units of PRBC.  She is been referred for admission.  Assessment & Plan:   Active Problems:   Hyperlipidemia   Essential hypertension   S/P AVR (aortic valve replacement)   Chronic respiratory failure with hypoxia, on home O2 therapy (HCC)   Hematoma of thigh, right, initial encounter   Chronic combined systolic and diastolic CHF (congestive heart failure) (HCC)  Supratherapeutic INR   Acute blood loss anemia   CKD (chronic kidney disease) stage 3, GFR 30-59 ml/min (HCC)   COPD (chronic obstructive pulmonary disease) (HCC)   Hematoma of right thigh   Pressure injury of skin  acute anemia/right Thigh hematoma in the setting of supertherapeutic INR (INR 4.78 on presentation) with acute blood loss anemia -hgb on presentation is 5.8, s/p prbc transfusion x2 on 9/8 -ortho consulted, conservative management for thigh hematoma noted -cardiology consulted for anticoagulation management in the setting of mechanical valve with recs for keeping INR on lower range between 2-3 -Resumed coumadin 9/12. INR down to 2.0 today. Continue coumadin dosing per pharmacy -Goal for stabilizing INR prior to d/c. As coumadin takes 3-5 days to take effect, would need to monitor in house incase lovenox bridge needed. Otherwise, pt may develop life-threatening clot if INR subtherapeutic or life-threatening bleed if super therapeutic -Hgb up to 8.9. Recheck INR and CBC in AM -Per Cardiology, Goal INR is 2.0-2.5 -Cardiology has signed off  R subacute non-displaced hip fracture-  S/p RIGHT CANNULATED HIP PINNING On 9/3 with cardiology for preop eval postop weightbearing status, wound care, pain management per ortho -Presently stable  Leukocytosis: -Question stress reaction -WBC trends reviewed, stable -Follow CBC in AM  CAD status post mechanical aortic valve placement. - chronic combined systolic/diastolic congestive  euvolemic at this time Continue comadin dosing per pharmacy Cardiology consulted, coumadin and lasix management per cardiology INR up to 2.4 this AM. Cont coumadin as tolerated  CKDIII  renal function  stable at baseline  HTN - Continue Coreg, hold acei for now -BP reviewed, soft BP this AM -Have added hold parameters on coreg   HLD - Continue Lipitor as tolerated  Chronic respiratory failure from COPD, on home O2 4liters at baseline-  this is due to underlying COPD, currently compensated, continue home nebulized medications and as needed nebulizer, continue oxygen.  -Speaking full sentences, stable  Dementia - Continue Aricept is at risk for delirium.  -Continue to avoid over medication with narcotics and benzodiazepines.  Severe malnutrition in context of chronic illness, Underweight -Nutrition consulted  Stage 1 sacral pressure ulcer  2.5 cm x 2.5 cmpresented on admission Pressure off loading Continue routine care as tolerated  DVT prophylaxis: Coumadin Code Status: DNR Family Communication: Pt in room, family at bedside Disposition Plan: Uncertain at this time  Consultants:   Orthopedic Surgery  Cardiology  Procedures:     Antimicrobials: Anti-infectives (From admission, onward)   None      Subjective: Without complaints  Objective: Vitals:   07/29/18 1713 07/29/18 2023 07/30/18 0658 07/30/18 0751  BP: (!) 102/52 (!) 130/56 (!) 155/65   Pulse: 68 70 76   Resp: 18 18 20    Temp: 98 F (36.7 C) 98.2 F (36.8 C) 98.3 F (36.8 C)   TempSrc: Oral Oral Oral   SpO2: 98% 100% 99% 99%  Weight:      Height:        Intake/Output Summary (Last 24 hours) at 07/30/2018 1443 Last data filed at 07/30/2018 0600 Gross per 24 hour  Intake 0 ml  Output 150 ml  Net -150 ml   Filed Weights   07/23/18 0949 07/23/18 1733 07/25/18 2141  Weight: 45.6 kg 52.4 kg 52.6 kg    Examination: General exam: Awake, laying in bed, in nad Respiratory system: Normal respiratory effort, no wheezing  Data Reviewed: I have personally reviewed following labs and imaging studies  CBC: Recent Labs  Lab 07/26/18 0459 07/27/18 0548 07/28/18 0500 07/29/18 0536 07/30/18 0756  WBC 12.0* 12.5* 12.0* 11.3* 11.2*  HGB 6.8* 8.9* 8.7* 8.1* 8.9*  HCT 21.6* 28.1* 27.6* 26.1* 29.1*  MCV 96.4 96.9 96.8 97.0 100.7*  PLT 220 291 353 383 485*   Basic Metabolic Panel: Recent Labs  Lab 07/24/18 0553  07/26/18 0459  NA 140 136  K 4.6 4.1  CL 105 102  CO2 28 28  GLUCOSE 103* 100*  BUN 19 25*  CREATININE 0.99 1.09*  CALCIUM 8.6* 8.1*   GFR: Estimated Creatinine Clearance: 27.7 mL/min (A) (by C-G formula based on SCr of 1.09 mg/dL (H)). Liver Function Tests: No results for input(s): AST, ALT, ALKPHOS, BILITOT, PROT, ALBUMIN in the last 168 hours. No results for input(s): LIPASE, AMYLASE in the last 168 hours. No results for input(s): AMMONIA in the last 168 hours. Coagulation Profile: Recent Labs  Lab 07/27/18 0548 07/28/18 0500 07/28/18 0729 07/29/18 0536 07/30/18 0756  INR 2.32 >10.00* 2.28 2.00 2.40   Cardiac Enzymes: No results for input(s): CKTOTAL, CKMB, CKMBINDEX, TROPONINI in the last 168 hours. BNP (last 3 results) No results for input(s): PROBNP in the last 8760 hours. HbA1C: No results for input(s): HGBA1C in the last 72 hours. CBG: No results for input(s): GLUCAP in the last 168 hours. Lipid Profile: No results for input(s): CHOL, HDL, LDLCALC, TRIG, CHOLHDL, LDLDIRECT in the last 72 hours. Thyroid Function Tests: No results for input(s): TSH, T4TOTAL, FREET4, T3FREE, THYROIDAB in the last 72 hours. Anemia Panel: No results for input(s):  VITAMINB12, FOLATE, FERRITIN, TIBC, IRON, RETICCTPCT in the last 72 hours. Sepsis Labs: No results for input(s): PROCALCITON, LATICACIDVEN in the last 168 hours.  No results found for this or any previous visit (from the past 240 hour(s)).   Radiology Studies: No results found.  Scheduled Meds: . sodium chloride   Intravenous Once  . atorvastatin  40 mg Oral QHS  . carvedilol  6.25 mg Oral BID WC  . docusate sodium  200 mg Oral QHS  . donepezil  10 mg Oral QHS  . hydrocortisone   Rectal TID  . polyethylene glycol  17 g Oral Daily  . pregabalin  75 mg Oral QODAY  . sodium chloride flush  3 mL Intravenous Q12H  . umeclidinium-vilanterol  1 puff Inhalation Daily  . warfarin  2 mg Oral ONCE-1800  . Warfarin -  Pharmacist Dosing Inpatient   Does not apply q1800   Continuous Infusions: . sodium chloride       LOS: 7 days   Rickey Barbara, MD [facility] Pager On Amion  If 7PM-7AM, please contact night-coverage 07/30/2018, 2:43 PM

## 2018-07-30 NOTE — Progress Notes (Signed)
ANTICOAGULATION CONSULT NOTE - Initial Consult  Pharmacy Consult for Warfarin Indication: Mechanical Aortic valve  Allergies  Allergen Reactions  . Codeine Nausea And Vomiting  . Penicillins Other (See Comments)    Unknown reaction - listed on MAR Has patient had a PCN reaction causing immediate rash, facial/tongue/throat swelling, SOB or lightheadedness with hypotension: Unknown Has patient had a PCN reaction causing severe rash involving mucus membranes or skin necrosis: Unknown Has patient had a PCN reaction that required hospitalization: Unknown Has patient had a PCN reaction occurring within the last 10 years: Unknown If all of the above answers are "NO", then may proceed with Cephalosporin use.    Patient Measurements: Height: 5\' 2"  (157.5 cm) Weight: 115 lb 15.4 oz (52.6 kg) IBW/kg (Calculated) : 50.1  Heparin dosing weight 52.4 kg   Vital Signs: Temp: 98.3 F (36.8 C) (09/15 0658) Temp Source: Oral (09/15 0658) BP: 155/65 (09/15 0658) Pulse Rate: 76 (09/15 0658)  Labs: Recent Labs    07/28/18 0500 07/28/18 0729 07/29/18 0536 07/30/18 0756  HGB 8.7*  --  8.1* 8.9*  HCT 27.6*  --  26.1* 29.1*  PLT 353  --  383 485*  LABPROT >90.0* 25.0* 22.5* 26.0*  INR >10.00* 2.28 2.00 2.40    Estimated Creatinine Clearance: 27.7 mL/min (A) (by C-G formula based on SCr of 1.09 mg/dL (H)).   Medical History: Past Medical History:  Diagnosis Date  . Carotid artery stenosis    Without infarction  . Chronic combined systolic (congestive) and diastolic (congestive) heart failure (HCC)   . COPD (chronic obstructive pulmonary disease) (HCC)   . Coronary artery disease   . Heart valve replaced by other means   . Hypercholesterolemia    Pure  . Hypertension    Unspecified  . LBBB (left bundle branch block)   . Macular degeneration (senile) of retina, unspecified   . On home O2   . Postsurgical aortocoronary bypass status   . Stroke (HCC)   . Transient global amnesia    . Unspecified hereditary and idiopathic peripheral neuropathy   . Unspecified vitamin D deficiency    Assessment: 84 yo female on Warfarin PTA for hx mech AVR + CVA. INR goal 2.2-3 per clinic notes.   PTA dose 2.5 mg daily, received 2 doses of warf 4 mg at rehab (9/5 + 9/6) with quick jump in INR (peak 4.78 on 9/8). 9/13 INR of >10 was a lab error.  INR within therapeutic range at 2.4 after 3mg  and 2.5 mg x2 doses of warfarin.    Goal of Therapy:  INR 2-3 Monitor platelets by anticoagulation protocol: Yes   Plan:  Warfarin 2 mg PO x 1 tonight.  Daily INR Monitor for s/sx of bleeding.   Ladell PierBrooke Alainna Stawicki, PharmD Pharmacy Resident Clinical Phone for 07/30/2018 until 3:30pm: [phone] If after 3:30pm, please call main pharmacy at [phone] 07/30/2018 11:35 AM

## 2018-07-30 NOTE — Progress Notes (Signed)
   Notes and labs reviewed.   Aim for INR 2-2.5. Nothing new to add.  Watch for any new bleeding.  Mechanical AV  [facility] will sign off.   Medication Recommendations:  No new Other recommendations (labs, testing, etc):  Monitor INR Follow up as an outpatient:  As previously sched. Make sure she has close coumadin follow up.   Donato SchultzMark Laquenta Whitsell, MD

## 2018-07-30 NOTE — Progress Notes (Signed)
Physical Therapy Treatment Patient Details Name: Colleen CablesRuth E Simpson MRN: [id number] DOB: 1926/08/30 Today's Date: 07/30/2018    History of Present Illness 84yo female admitted from rehab with significant pain and swelling in her right thigh.  Labs were checked at the nursing home and she was found to have a worsening anemia as well as a supratherapeutic INR. She received R cannulated hip pinning on 07/18/18. PMH CHF, COPD, HTN, L BBB, macular degeneration, CVA, transient global amnesia, hx CABG     PT Comments    [facility]. Requiring less overall assist for transfers, with min guard needed for stability and use of walker. Demonstrating improvement in proximal strength with achieving sit to stands and increased weightbearing noted through RLE. Continues to fatigue easily and display poor endurance. D/c plan remains appropriate. Will continue to progress mobility.    Follow Up Recommendations  SNF;Supervision for mobility/OOB     Equipment Recommendations  None recommended by PT    Recommendations for Other Services       Precautions / Restrictions Precautions Precautions: Fall Restrictions Weight Bearing Restrictions: Yes RLE Weight Bearing: Weight bearing as tolerated    Mobility  Bed Mobility Overal bed mobility: Needs Assistance Bed Mobility: Sit to Supine     Supine to sit: Min guard     General bed mobility comments: patient able to use arms to assist RLE into bed  Transfers Overall transfer level: Needs assistance Equipment used: Rolling walker (2 wheeled) Transfers: Sit to/from Stand Sit to Stand: Min guard         General transfer comment: Min guard for safety with transfer from recliner and BSC  Ambulation/Gait Ambulation/Gait assistance: Min guard   Assistive device: Rolling walker (2 wheeled) Gait Pattern/deviations: Step-to pattern;Narrow base of support;Decreased weight shift to right;Decreased stance  time - right;Decreased stride length Gait velocity: decreased Gait velocity interpretation: <1.31 ft/sec, indicative of household ambulator General Gait Details: pivotal steps [facility] and [facility] to bed   Stairs             Wheelchair Mobility    Modified Rankin (Stroke Patients Only)       Balance Overall balance assessment: Needs assistance Sitting-balance support: Feet supported;Bilateral upper extremity supported Sitting balance-Leahy Scale: Fair       Standing balance-Leahy Scale: Poor Standing balance comment: reliant on bil UE support                            Cognition Arousal/Alertness: Awake/alert Behavior During Therapy: WFL for tasks assessed/performed Overall Cognitive Status: Within Functional Limits for tasks assessed                                        Exercises      General Comments General comments (skin integrity, edema, etc.): session on 2L O2      Pertinent Vitals/Pain Pain Assessment: Faces Faces Pain Scale: Hurts even more Pain Location: RLE increases with mobility  Pain Descriptors / Indicators: Aching;Sore;Grimacing;Guarding Pain Intervention(s): Limited activity within patient's tolerance;Monitored during session    Home Living                      Prior Function            PT Goals (current goals can now be  found in the care plan section) Acute Rehab PT Goals Patient Stated Goal: [facility] and get stronger  Potential to Achieve Goals: Good Progress towards PT goals: Progressing toward goals    Frequency    Min 3X/week      PT Plan Frequency needs to be updated    Co-evaluation              AM-PAC PT "6 Clicks" Daily Activity  Outcome Measure  Difficulty turning over in bed (including adjusting bedclothes, sheets and blankets)?: A Lot Difficulty moving from lying on back to sitting on the side of the bed? : A Lot Difficulty sitting down on and  standing up from a chair with arms (e.g., wheelchair, bedside commode, etc,.)?: Unable Help needed moving to and from a bed to chair (including a wheelchair)?: A Little Help needed walking in hospital room?: A Little Help needed climbing 3-5 steps with a railing? : Total 6 Click Score: 12    End of Session Equipment Utilized During Treatment: Gait belt Activity Tolerance: Patient tolerated treatment well Patient left: with family/visitor present;in bed;with call bell/phone within reach Nurse Communication: Mobility status PT Visit Diagnosis: Unsteadiness on feet (R26.81);Difficulty in walking, not elsewhere classified (R26.2);Muscle weakness (generalized) (M62.81);History of falling (Z91.81)     Time: 1610-9604 PT Time Calculation (min) (ACUTE ONLY): 22 min  Charges:  $Therapeutic Activity: 8-22 mins                    [name], PT, DPT Acute Rehabilitation Services Pager [phone] Office [phone]   [name] 07/30/2018, 4:50 PM

## 2018-07-31 ENCOUNTER — Ambulatory Visit: Payer: Self-pay | Admitting: Pharmacist

## 2018-07-31 DIAGNOSIS — J418 Mixed simple and mucopurulent chronic bronchitis: Secondary | ICD-10-CM

## 2018-07-31 LAB — PROTIME-INR
INR: 2.85
Prothrombin Time: 29.7 seconds — ABNORMAL HIGH (ref 11.4–15.2)

## 2018-07-31 NOTE — Progress Notes (Signed)
ANTICOAGULATION CONSULT NOTE - Follow-Up  Pharmacy Consult for Warfarin Indication: Mechanical Aortic valve  Patient Measurements: Height: 5\' 2"  (157.5 cm) Weight: 115 lb 15.4 oz (52.6 kg) IBW/kg (Calculated) : 50.1  Heparin dosing weight 52.4 kg   Vital Signs:    Labs: Recent Labs    07/29/18 0536 07/30/18 0756 07/31/18 0551  HGB 8.1* 8.9*  --   HCT 26.1* 29.1*  --   PLT 383 485*  --   LABPROT 22.5* 26.0* 29.7*  INR 2.00 2.40 2.85    Estimated Creatinine Clearance: 27.7 mL/min (A) (by C-G formula based on SCr of 1.09 mg/dL (H)).   Assessment: 84 yo female on Warfarin PTA for hx mech AVR + CVA. Pharmacy consulted to resume warfarin dosing this admission.   PTA dose 2.5 mg daily, received 2 doses of warf 4 mg at rehab (9/5 + 9/6) with quick jump in INR (peak 4.78 on 9/8). 9/13 INR of >10 was a lab error.  INR today is slightly SUPRAtherapeutic of lower goal range recently specified by cardiology (INR 2.85 << 2.4, INR goal 2-2.5). No CBC today but was stable and trending up on 9/15 AM labs. No bleeding noted. Will hold the warfarin dose today given INR trends up.     Goal of Therapy:  INR 2-2.5 Monitor platelets by anticoagulation protocol: Yes   Plan:  - Hold warfarin dose today - Will continue to monitor for any signs/symptoms of bleeding and will follow up with PT/INR in the a.m.   Thank you for allowing pharmacy to be a part of this patient's care.  Colleen PillionElizabeth Jakelyn Simpson, PharmD, BCPS Clinical Pharmacist Pager: [phone] Clinical phone for 07/31/2018 from 7a-3:30p: [phone] If after 3:30p, please call main pharmacy at: [phone] Please check [facility] for all [facility] numbers 07/31/2018 9:26 AM

## 2018-07-31 NOTE — Progress Notes (Signed)
HPI: FU coronary artery disease (s/p CABG), prior aortic valve replacement and aortic insufficiency. Last cardiac catheterization in 2011 revealed normal left main; 30% first diagonal, 50% LAD and 60% second diagonal. There was a 40-50% circumflex and a 40% right coronary artery. All grafts were occluded. Medical therapy recommended. Transesophageal echocardiogram in February of 2012 showed an ejection fraction of 45%. There was a St. Jude aortic valve with moderate perivalvular aortic insufficiency. There was moderate mitral regurgitation, moderate left atrial enlargement, mild right atrial enlargement and mild tricuspid regurgitation.Carotid Dopplers August 2019 showed 1 to 39% bilateral stenosis. Last echo 3/19 interpreted as ejection fraction 50 to 55%, trace aortic insufficiency with bioprosthetic aortic valve, moderate to severe left atrial enlargement and mild to moderate pulmonary hypertension.  I personally reviewed this echocardiogram and thought ejection fraction was 35%.  Patient recently admitted with hip fracture.  She underwent surgical repair.  She was discharged with Lovenox bridge and reinitiation of Coumadin but developed a thigh hematoma.  Since she was last seen,dyspnea, chest pain, palpitations or syncope.  Current Outpatient Medications  Medication Sig Dispense Refill  . ALPRAZolam (XANAX) 0.25 MG tablet Take 1 tablet (0.25 mg total) by mouth at bedtime as needed for anxiety. 14 tablet 0  . HYDROcodone-acetaminophen (NORCO/VICODIN) 5-325 MG tablet Take 1-2 tablets by mouth every 6 (six) hours as needed for moderate pain. 60 tablet 0  . pregabalin (LYRICA) 75 MG capsule Take 1 capsule (75 mg total) by mouth every other day. 15 capsule 0  . traMADol (ULTRAM) 50 MG tablet Take 1 tablet (50 mg total) by mouth every 6 (six) hours as needed. 60 tablet 0   No current facility-administered medications for this visit.      Past Medical History:  Diagnosis Date  . Carotid  artery stenosis    Without infarction  . Chronic combined systolic (congestive) and diastolic (congestive) heart failure (HCC)   . COPD (chronic obstructive pulmonary disease) (HCC)   . Coronary artery disease   . Heart valve replaced by other means   . Hypercholesterolemia    Pure  . Hypertension    Unspecified  . LBBB (left bundle branch block)   . Macular degeneration (senile) of retina, unspecified   . On home O2   . Postsurgical aortocoronary bypass status   . Stroke (HCC)   . Transient global amnesia   . Unspecified hereditary and idiopathic peripheral neuropathy   . Unspecified vitamin D deficiency     Past Surgical History:  Procedure Laterality Date  . AORTIC VALVE REPLACEMENT    . APPENDECTOMY    . CORONARY ARTERY BYPASS GRAFT    . HIP PINNING,CANNULATED Right 07/18/2018   Procedure: RIGHT CANNULATED HIP PINNING;  Surgeon: Myrene Galas, MD;  Location: [facility] OR;  Service: Orthopedics;  Laterality: Right;  . TONSILLECTOMY      Social History   Socioeconomic History  . Marital status: Widowed    Spouse name: Not on file  . Number of children: 2  . Years of education: 12th  . Highest education level: Not on file  Occupational History    Employer: RETIRED  Social Needs  . Financial resource strain: Not on file  . Food insecurity:    Worry: Not on file    Inability: Not on file  . Transportation needs:    Medical: Not on file    Non-medical: Not on file  Tobacco Use  . Smoking status: Former Smoker    Packs/day: 0.50  Types: Cigarettes    Last attempt to quit: 11/30/1991    Years since quitting: 26.7  . Smokeless tobacco: Never Used  . Tobacco comment: Tobacco use-no  Substance and Sexual Activity  . Alcohol use: No  . Drug use: No  . Sexual activity: Never  Lifestyle  . Physical activity:    Days per week: Not on file    Minutes per session: Not on file  . Stress: Not on file  Relationships  . Social connections:    Talks on phone: Not on file     Gets together: Not on file    Attends religious service: Not on file    Active member of club or organization: Not on file    Attends meetings of clubs or organizations: Not on file    Relationship status: Not on file  . Intimate partner violence:    Fear of current or ex partner: Not on file    Emotionally abused: Not on file    Physically abused: Not on file    Forced sexual activity: Not on file  Other Topics Concern  . Not on file  Social History Narrative   Pt lives at home alone.   Caffeine Use: very rarely    Family History  Problem Relation Age of Onset  . Heart attack Father   . Stroke Sister   . Neuropathy Brother   . COPD Sister     ROS: Thigh pain from recent hematoma but no fevers or chills, productive cough, hemoptysis, dysphasia, odynophagia, melena, hematochezia, dysuria, hematuria, rash, seizure activity, orthopnea, PND, pedal edema, claudication. Remaining systems are negative.  Physical Exam: Well-developed well-nourished in no acute distress.  Skin is warm and dry.  HEENT is normal.  Neck is supple.  Chest is clear to auscultation with normal expansion.  Cardiovascular exam is regular rate and rhythm.  Crisp mechanical valve sound.  2/6 systolic and diastolic murmur. Abdominal exam nontender or distended. No masses palpated. Extremities show right thigh hematoma. neuro grossly intact  A/P  1 coronary artery disease-plan to continue statin.  Aspirin was discontinued recently because of thigh hematoma.  We will consider resuming in the future.  2 status post aortic valve replacement-continue SBE prophylaxis.  We would like to be conservative given patient's age.  She will need follow-up echoes in the future.  Continue Coumadin.  3 cardiomyopathy-as outlined previously we are being conservative given patient's age.  Continue beta-blocker.  ACE inhibitor on hold as blood pressure borderline.  We will resume later as blood pressure allows.  Note previous  echocardiogram interpreted as ejection fraction 50 to 55% but I personally reviewed and felt more likely to be 35%.  4 chronic systolic congestive heart failure-she is euvolemic on examination.  Continue present dose of diuretic.  5 hypertension-patient's blood pressure is controlled.  Continue present medications.  6 hyperlipidemia-continue statin.  7 carotid artery disease-mild on last carotid Dopplers.  8 thigh hematoma  Olga MillersBrian Kirby Cortese, MD

## 2018-07-31 NOTE — Progress Notes (Signed)
Physical Therapy Treatment Patient Details Name: [name] MRN: [id number] DOB: 09/20/1926 Today's Date: 07/31/2018    History of Present Illness 84yo female admitted from rehab with significant pain and swelling in her right thigh.  Labs were checked at the nursing home and she was found to have a worsening anemia as well as a supratherapeutic INR. She received R cannulated hip pinning on 07/18/18. PMH CHF, COPD, HTN, L BBB, macular degeneration, CVA, transient global amnesia, hx CABG     PT Comments    Continuing work on functional mobility and activity tolerance;  Session focued on encouraging more RLE stance stability, and fostering return to more normal, efficient step-through gait pattern; Overall progressing well; Anticipate continuing good progress at post-acute rehabilitation.   Follow Up Recommendations  SNF;Supervision for mobility/OOB     Equipment Recommendations  None recommended by PT    Recommendations for Other Services       Precautions / Restrictions Precautions Precautions: Fall Restrictions RLE Weight Bearing: Weight bearing as tolerated    Mobility  Bed Mobility               General bed mobility comments: Sitting EOB upon arrival  Transfers Overall transfer level: Needs assistance Equipment used: Rolling walker (2 wheeled) Transfers: Sit to/from Stand Sit to Stand: Min guard         General transfer comment: Minguard for safety; cues for hand placement and to control descent to sit  Ambulation/Gait Ambulation/Gait assistance: Min assist;+2 safety/equipment Gait Distance (Feet): 80 Feet(30+50) Assistive device: Rolling walker (2 wheeled) Gait Pattern/deviations: Step-through pattern(Emerging) Gait velocity: decreased   General Gait Details: Cues focused on standing tall on RLE in stance and activating R quad and gluteals for stability; Able to initiate step-through pattern; min assist for stability; needs chair push as she fatigues  quickly   Stairs             Wheelchair Mobility    Modified Rankin (Stroke Patients Only)       Balance     Sitting balance-Leahy Scale: Fair       Standing balance-Leahy Scale: Poor Standing balance comment: reliant on bil UE support                            Cognition Arousal/Alertness: Awake/alert Behavior During Therapy: WFL for tasks assessed/performed Overall Cognitive Status: Within Functional Limits for tasks assessed                                        Exercises      General Comments General comments (skin integrity, edema, etc.): Session conducted on Room air and O2 sats 97% at end of session      Pertinent Vitals/Pain Pain Assessment: Faces Faces Pain Scale: Hurts little more Pain Location: RLE increases with mobility  Pain Descriptors / Indicators: Aching;Sore;Grimacing;Guarding Pain Intervention(s): Monitored during session;RN gave pain meds during session    Home Living                      Prior Function            PT Goals (current goals can now be found in the care plan section) Acute Rehab PT Goals Patient Stated Goal: [facility] and get [facility]  PT Goal Formulation: With patient/family Time For Goal Achievement: 08/07/18  Potential to Achieve Goals: Good Progress towards PT goals: Progressing toward goals    Frequency    Min 3X/week      PT Plan Current plan remains appropriate    Co-evaluation              AM-PAC PT "6 Clicks" Daily Activity  Outcome Measure  Difficulty turning over in bed (including adjusting bedclothes, sheets and blankets)?: A Lot Difficulty moving from lying on back to sitting on the side of the bed? : A Lot Difficulty sitting down on and standing up from a chair with arms (e.g., wheelchair, bedside commode, etc,.)?: A Lot Help needed moving to and from a bed to chair (including a wheelchair)?: A Little Help needed walking in hospital  room?: A Little Help needed climbing 3-5 steps with a railing? : A Lot 6 Click Score: 14    End of Session Equipment Utilized During Treatment: Gait belt Activity Tolerance: Patient tolerated treatment well Patient left: with family/visitor present;with call bell/phone within reach;in chair Nurse Communication: Mobility status PT Visit Diagnosis: Unsteadiness on feet (R26.81);Difficulty in walking, not elsewhere classified (R26.2);Muscle weakness (generalized) (M62.81);History of falling (Z91.81)     Time: [id number] PT Time Calculation (min) (ACUTE ONLY): 20 min  Charges:  $Gait Training: 8-22 mins                     Van ClinesHolly Yasser Hepp, [place]PT  Acute Rehabilitation Services Pager [phone] Office [phone]    Levi AlandHolly H Oluwadamilola Rosamond 07/31/2018, 10:36 AM

## 2018-07-31 NOTE — Consult Note (Addendum)
   [facility]   07/31/2018  Colleen CablesRuth E Simpson [birth year] [id number]    [facility].  Went to bedside to speak with [name] and daughter about [facility]. [name] remains agreeable. States she plans on [facility] once her INR is normal.   Provided contact information to daughter. Made both patient and daughter aware that [facility].  Expressed appreciation of visit.   Raiford NobleAtika Leann Mayweather, MSN-Ed, RN,BSN [facility] Care [facility] Hospital Liaison [phone]

## 2018-07-31 NOTE — Progress Notes (Signed)
PROGRESS NOTE    [name]  ZOX:[id number] DOB: 1925/12/15 DOA: 07/23/2018 PCP: Kari Baars, MD    Brief Narrative:  84 y.o. 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  Hospital course was complicated by development of postop anemia.  She received 2 units of PRBC.  Patient was discharged to nursing facility.  INR was 1.7 the time of discharge, so she was discharged on Coumadin and Lovenox bridge.  Over the past few days, she started to develop significant swelling in her right lower extremity.  She did not have any fever, cough, shortness of breath.  She had significant pain and swelling in her right thigh.  She denies any new numbness or tingling in her right leg.  Labs were checked at the nursing home and she was found to have a worsening anemia as well as a supratherapeutic INR.  She comes back to the emergency room for evaluation  ED Course: Vitals were noted to be stable.  She is not tachycardic.  Blood pressure stable.  INR is elevated at 4.7.  Hemoglobin is down to 6.7.  Serum chemistry is unrevealing.  CT of the right femur, shows anterior compartmental hematoma, probably in the vastus intermedius muscle, measuring about 700 cm in volume.  No active extravasation of contrast is noted.  Patient was ordered 2 units of PRBC.  She is been referred for admission.  Assessment & Plan:   Active Problems:   Hyperlipidemia   Essential hypertension   S/P AVR (aortic valve replacement)   Chronic respiratory failure with hypoxia, on home O2 therapy (HCC)   Hematoma of thigh, right, initial encounter   Chronic combined systolic and diastolic CHF (congestive heart failure) (HCC)  Supratherapeutic INR   Acute blood loss anemia   CKD (chronic kidney disease) stage 3, GFR 30-59 ml/min (HCC)   COPD (chronic obstructive pulmonary disease) (HCC)   Hematoma of right thigh   Pressure injury of skin  acute anemia/right Thigh hematoma in the setting of supertherapeutic INR (INR 4.78 on presentation) with acute blood loss anemia -hgb on presentation is 5.8, s/p prbc transfusion x2 on 9/8 -ortho consulted, conservative management for thigh hematoma noted -cardiology consulted for anticoagulation management in the setting of mechanical valve with recs for keeping INR on lower range between 2-3 -Resumed coumadin 9/12. INR down to 2.0 today. Continue coumadin dosing per pharmacy -Goal for stabilizing INR prior to d/c. As coumadin takes 3-5 days to take effect, would need to monitor in house incase lovenox bridge needed. Otherwise, pt may develop life-threatening clot if INR subtherapeutic or life-threatening bleed if super therapeutic -Per Cardiology, Goal INR is 2.0-2.5 -Cardiology has signed off -INR today up to 2.8. Given recent severe bleed, will ensure INR stabilizes before discharge. Will repeat INR in AM. Pharmacy to continue to dose coumadin  R subacute non-displaced hip fracture-  S/p RIGHT CANNULATED HIP PINNING On 9/3 with cardiology for preop eval postop weightbearing status, wound care, pain management per ortho -Presently stable  Leukocytosis: -Question stress reaction -WBC trends reviewed, stable -Follow CBC in AM  CAD status post mechanical aortic valve placement. - chronic combined systolic/diastolic congestive  euvolemic at this time Continue comadin dosing per pharmacy Cardiology consulted, coumadin and lasix management per  cardiology INR up to 2.4 this AM. Cont coumadin as tolerated  CKDIII  renal function stable at baseline  HTN - Continue Coreg, hold acei for now -BP reviewed, soft BP this AM -Have added hold parameters on  coreg   HLD - Continue Lipitor as tolerated  Chronic respiratory failure from COPD, on home O2 4liters at baseline- this is due to underlying COPD, currently compensated, continue home nebulized medications and as needed nebulizer, continue oxygen.  -Speaking full sentences, stable  Dementia - Continue Aricept is at risk for delirium.  -Continue to avoid over medication with narcotics and benzodiazepines.  Severe malnutrition in context of chronic illness, Underweight -Nutrition consulted  Stage 1 sacral pressure ulcer  2.5 cm x 2.5 cmpresented on admission Pressure off loading Continue routine care as tolerated  DVT prophylaxis: Coumadin Code Status: DNR Family Communication: Pt in room, family at bedside Disposition Plan: Uncertain at this time  Consultants:   Orthopedic Surgery  Cardiology  Procedures:     Antimicrobials: Anti-infectives (From admission, onward)   None      Subjective: Without complaints  Objective: Vitals:   07/30/18 1449 07/30/18 1704 07/30/18 2024 07/31/18 1143  BP: 104/60 (!) 143/59 (!) 139/53   Pulse: 88 86 73   Resp: 18 18 16    Temp: 98 F (36.7 C) 98.8 F (37.1 C) 98.3 F (36.8 C)   TempSrc: Oral Oral Oral   SpO2: 99% 98% 98% 97%  Weight:      Height:        Intake/Output Summary (Last 24 hours) at 07/31/2018 1552 Last data filed at 07/30/2018 1725 Gross per 24 hour  Intake 240 ml  Output -  Net 240 ml   Filed Weights   07/23/18 0949 07/23/18 1733 07/25/18 2141  Weight: 45.6 kg 52.4 kg 52.6 kg    Examination: General exam: Conversant, in no acute distress Respiratory system: normal chest rise, clear, no audible wheezing Cardiovascular system: regular rhythm, s1-s2  Data Reviewed: I have personally reviewed following labs and imaging studies  CBC: Recent Labs  Lab 07/26/18 0459 07/27/18 0548 07/28/18 0500 07/29/18 0536 07/30/18 0756  WBC 12.0* 12.5* 12.0* 11.3* 11.2*  HGB 6.8* 8.9* 8.7* 8.1*  8.9*  HCT 21.6* 28.1* 27.6* 26.1* 29.1*  MCV 96.4 96.9 96.8 97.0 100.7*  PLT 220 291 353 383 485*   Basic Metabolic Panel: Recent Labs  Lab 07/26/18 0459  NA 136  K 4.1  CL 102  CO2 28  GLUCOSE 100*  BUN 25*  CREATININE 1.09*  CALCIUM 8.1*   GFR: Estimated Creatinine Clearance: 27.7 mL/min (A) (by C-G formula based on SCr of 1.09 mg/dL (H)). Liver Function Tests: No results for input(s): AST, ALT, ALKPHOS, BILITOT, PROT, ALBUMIN in the last 168 hours. No results for input(s): LIPASE, AMYLASE in the last 168 hours. No results for input(s): AMMONIA in the last 168 hours. Coagulation Profile: Recent Labs  Lab 07/28/18 0500 07/28/18 0729 07/29/18 0536 07/30/18 0756 07/31/18 0551  INR >10.00* 2.28 2.00 2.40 2.85   Cardiac Enzymes: No results for input(s): CKTOTAL, CKMB, CKMBINDEX, TROPONINI in the last 168 hours. BNP (last 3 results) No results for input(s): PROBNP in the last 8760 hours. HbA1C: No results for input(s): HGBA1C in the last 72 hours. CBG: No results for input(s): GLUCAP in the last 168 hours. Lipid Profile: No results for input(s): CHOL, HDL, LDLCALC, TRIG, CHOLHDL, LDLDIRECT in the last 72 hours. Thyroid Function Tests: No results for input(s): TSH, T4TOTAL, FREET4, T3FREE, THYROIDAB in  the last 72 hours. Anemia Panel: No results for input(s): VITAMINB12, FOLATE, FERRITIN, TIBC, IRON, RETICCTPCT in the last 72 hours. Sepsis Labs: No results for input(s): PROCALCITON, LATICACIDVEN in the last 168 hours.  No results found for this or any previous visit (from the past 240 hour(s)).   Radiology Studies: No results found.  Scheduled Meds: . sodium chloride   Intravenous Once  . atorvastatin  40 mg Oral QHS  . carvedilol  6.25 mg Oral BID WC  . docusate sodium  200 mg Oral QHS  . donepezil  10 mg Oral QHS  . hydrocortisone   Rectal TID  . polyethylene glycol  17 g Oral Daily  . pregabalin  75 mg Oral QODAY  . sodium chloride flush  3 mL  Intravenous Q12H  . umeclidinium-vilanterol  1 puff Inhalation Daily  . Warfarin - Pharmacist Dosing Inpatient   Does not apply q1800   Continuous Infusions: . sodium chloride       LOS: 8 days   Rickey BarbaraStephen Ward Boissonneault, MD [facility] Pager On Amion  If 7PM-7AM, please contact night-coverage 07/31/2018, 3:52 PM

## 2018-08-01 ENCOUNTER — Inpatient Hospital Stay (HOSPITAL_COMMUNITY): Payer: Medicare Other

## 2018-08-01 ENCOUNTER — Inpatient Hospital Stay
Admission: RE | Admit: 2018-08-01 | Discharge: 2018-08-26 | Disposition: A | Discharge status: Home / Self Care | Payer: Medicare Other | Source: Ambulatory Visit | Attending: Internal Medicine | Admitting: Internal Medicine

## 2018-08-01 DIAGNOSIS — D62 Acute posthemorrhagic anemia: Secondary | ICD-10-CM | POA: Diagnosis not present

## 2018-08-01 DIAGNOSIS — H353221 Exudative age-related macular degeneration, left eye, with active choroidal neovascularization: Secondary | ICD-10-CM | POA: Diagnosis not present

## 2018-08-01 DIAGNOSIS — M96841 Postprocedural hematoma of a musculoskeletal structure following other procedure: Secondary | ICD-10-CM | POA: Diagnosis not present

## 2018-08-01 DIAGNOSIS — H3530 Unspecified macular degeneration: Secondary | ICD-10-CM | POA: Diagnosis not present

## 2018-08-01 DIAGNOSIS — H353112 Nonexudative age-related macular degeneration, right eye, intermediate dry stage: Secondary | ICD-10-CM | POA: Diagnosis not present

## 2018-08-01 DIAGNOSIS — I5022 Chronic systolic (congestive) heart failure: Secondary | ICD-10-CM | POA: Diagnosis not present

## 2018-08-01 DIAGNOSIS — H35352 Cystoid macular degeneration, left eye: Secondary | ICD-10-CM | POA: Diagnosis not present

## 2018-08-01 DIAGNOSIS — I5032 Chronic diastolic (congestive) heart failure: Secondary | ICD-10-CM | POA: Diagnosis not present

## 2018-08-01 DIAGNOSIS — R791 Abnormal coagulation profile: Secondary | ICD-10-CM | POA: Diagnosis not present

## 2018-08-01 DIAGNOSIS — I5042 Chronic combined systolic (congestive) and diastolic (congestive) heart failure: Secondary | ICD-10-CM | POA: Diagnosis not present

## 2018-08-01 DIAGNOSIS — R2689 Other abnormalities of gait and mobility: Secondary | ICD-10-CM | POA: Diagnosis not present

## 2018-08-01 DIAGNOSIS — Z4789 Encounter for other orthopedic aftercare: Secondary | ICD-10-CM | POA: Diagnosis not present

## 2018-08-01 DIAGNOSIS — F0390 Unspecified dementia without behavioral disturbance: Secondary | ICD-10-CM | POA: Diagnosis not present

## 2018-08-01 DIAGNOSIS — S7011XA Contusion of right thigh, initial encounter: Secondary | ICD-10-CM | POA: Diagnosis not present

## 2018-08-01 DIAGNOSIS — I2510 Atherosclerotic heart disease of native coronary artery without angina pectoris: Secondary | ICD-10-CM | POA: Diagnosis not present

## 2018-08-01 DIAGNOSIS — J9611 Chronic respiratory failure with hypoxia: Secondary | ICD-10-CM | POA: Diagnosis not present

## 2018-08-01 DIAGNOSIS — Z96641 Presence of right artificial hip joint: Secondary | ICD-10-CM | POA: Diagnosis not present

## 2018-08-01 DIAGNOSIS — Z9981 Dependence on supplemental oxygen: Secondary | ICD-10-CM | POA: Diagnosis not present

## 2018-08-01 DIAGNOSIS — J418 Mixed simple and mucopurulent chronic bronchitis: Secondary | ICD-10-CM | POA: Diagnosis not present

## 2018-08-01 DIAGNOSIS — I10 Essential (primary) hypertension: Secondary | ICD-10-CM | POA: Diagnosis not present

## 2018-08-01 DIAGNOSIS — Z743 Need for continuous supervision: Secondary | ICD-10-CM | POA: Diagnosis not present

## 2018-08-01 DIAGNOSIS — I130 Hypertensive heart and chronic kidney disease with heart failure and stage 1 through stage 4 chronic kidney disease, or unspecified chronic kidney disease: Secondary | ICD-10-CM | POA: Diagnosis not present

## 2018-08-01 DIAGNOSIS — D649 Anemia, unspecified: Secondary | ICD-10-CM | POA: Diagnosis not present

## 2018-08-01 DIAGNOSIS — Z9181 History of falling: Secondary | ICD-10-CM | POA: Diagnosis not present

## 2018-08-01 DIAGNOSIS — M25561 Pain in right knee: Secondary | ICD-10-CM | POA: Diagnosis not present

## 2018-08-01 DIAGNOSIS — D508 Other iron deficiency anemias: Secondary | ICD-10-CM | POA: Diagnosis not present

## 2018-08-01 DIAGNOSIS — I359 Nonrheumatic aortic valve disorder, unspecified: Secondary | ICD-10-CM | POA: Diagnosis not present

## 2018-08-01 DIAGNOSIS — S72001D Fracture of unspecified part of neck of right femur, subsequent encounter for closed fracture with routine healing: Secondary | ICD-10-CM | POA: Diagnosis not present

## 2018-08-01 DIAGNOSIS — E43 Unspecified severe protein-calorie malnutrition: Secondary | ICD-10-CM | POA: Diagnosis not present

## 2018-08-01 DIAGNOSIS — Z952 Presence of prosthetic heart valve: Secondary | ICD-10-CM | POA: Diagnosis not present

## 2018-08-01 DIAGNOSIS — E785 Hyperlipidemia, unspecified: Secondary | ICD-10-CM | POA: Diagnosis not present

## 2018-08-01 DIAGNOSIS — Z7901 Long term (current) use of anticoagulants: Secondary | ICD-10-CM | POA: Diagnosis not present

## 2018-08-01 DIAGNOSIS — R262 Difficulty in walking, not elsewhere classified: Secondary | ICD-10-CM | POA: Diagnosis not present

## 2018-08-01 DIAGNOSIS — H353211 Exudative age-related macular degeneration, right eye, with active choroidal neovascularization: Secondary | ICD-10-CM | POA: Diagnosis not present

## 2018-08-01 DIAGNOSIS — Z23 Encounter for immunization: Secondary | ICD-10-CM | POA: Diagnosis not present

## 2018-08-01 DIAGNOSIS — J441 Chronic obstructive pulmonary disease with (acute) exacerbation: Secondary | ICD-10-CM | POA: Diagnosis not present

## 2018-08-01 DIAGNOSIS — Z951 Presence of aortocoronary bypass graft: Secondary | ICD-10-CM | POA: Diagnosis not present

## 2018-08-01 DIAGNOSIS — I6529 Occlusion and stenosis of unspecified carotid artery: Secondary | ICD-10-CM | POA: Diagnosis not present

## 2018-08-01 DIAGNOSIS — S72011A Unspecified intracapsular fracture of right femur, initial encounter for closed fracture: Secondary | ICD-10-CM | POA: Diagnosis not present

## 2018-08-01 DIAGNOSIS — M6281 Muscle weakness (generalized): Secondary | ICD-10-CM | POA: Diagnosis not present

## 2018-08-01 DIAGNOSIS — M79651 Pain in right thigh: Secondary | ICD-10-CM | POA: Diagnosis not present

## 2018-08-01 DIAGNOSIS — N183 Chronic kidney disease, stage 3 (moderate): Secondary | ICD-10-CM | POA: Diagnosis not present

## 2018-08-01 DIAGNOSIS — R279 Unspecified lack of coordination: Secondary | ICD-10-CM | POA: Diagnosis not present

## 2018-08-01 DIAGNOSIS — R0602 Shortness of breath: Secondary | ICD-10-CM | POA: Diagnosis not present

## 2018-08-01 DIAGNOSIS — X58XXXD Exposure to other specified factors, subsequent encounter: Secondary | ICD-10-CM | POA: Diagnosis not present

## 2018-08-01 LAB — CBC
HEMATOCRIT: 25.8 % — AB (ref 36.0–46.0)
HEMOGLOBIN: 8 g/dL — AB (ref 12.0–15.0)
MCH: 31 pg (ref 26.0–34.0)
MCHC: 31 g/dL (ref 30.0–36.0)
MCV: 100 fL (ref 78.0–100.0)
Platelets: 574 10*3/uL — ABNORMAL HIGH (ref 150–400)
RBC: 2.58 MIL/uL — ABNORMAL LOW (ref 3.87–5.11)
RDW: 14.8 % (ref 11.5–15.5)
WBC: 11.8 10*3/uL — ABNORMAL HIGH (ref 4.0–10.5)

## 2018-08-01 LAB — PROTIME-INR
INR: 2.87
Prothrombin Time: 29.8 seconds — ABNORMAL HIGH (ref 11.4–15.2)

## 2018-08-01 MED ORDER — TRAMADOL HCL 50 MG PO TABS: 50.0000 mg | ORAL_TABLET | Freq: Four times a day (QID) | ORAL | 0 refills | Status: DC | PRN

## 2018-08-01 MED ORDER — HYDROCODONE-ACETAMINOPHEN 5-325 MG PO TABS: 1.0000 | ORAL_TABLET | Freq: Four times a day (QID) | ORAL | 0 refills | Status: DC | PRN

## 2018-08-01 MED ORDER — ALPRAZOLAM 0.25 MG PO TABS: 0.2500 mg | ORAL_TABLET | Freq: Every evening | ORAL | 0 refills | Status: DC | PRN

## 2018-08-01 NOTE — Discharge Summary (Signed)
Physician Discharge Summary  [name] NWG:[id number] DOB: 17-Aug-1926 DOA: 07/23/2018  PCP: Kari Baars, MD  Admit date: 07/23/2018 Discharge date: 08/01/2018  Admitted From: SNF Disposition:  SNF  Recommendations for Outpatient Follow-up:  1. Follow up with PCP in 1-2 weeks 2. Please obtain BMP/CBC within one week and have PCP follow up results 3. Follow up with Cardiology as scheduled 4. Please check INR at least every 2-3 days starting on 9/18 until INR therapeutic (Goal INR 2.0-2.5) 5. Hold coumadin dose for 9/17 6. Most recent INR on 9/17 of 2.87  Discharge Condition:Improved CODE STATUS:DNR Diet recommendation: Heart healthy   Brief/Interim Summary: 84 y.o.femalewith medical history significant ofchronic combined CHF with ejection fraction of 35%, COPD, chronic respiratory failure on home oxygen, chronic kidney disease stage III, status post mechanical aortic valve on chronic anticoagulation, who was recently in the hospital and discharged on 9/6 after being treated for a right hip fracture. Hospital course was complicated by development of postop anemia. She received 2 units of PRBC. Patient was discharged to nursing facility. INR was 1.7 the time of discharge, so she was discharged on Coumadin and Lovenox bridge. Over the past few days, she started to develop significant swelling in her right lower extremity. She did not have any fever, cough, shortness of breath. She had significant pain and swelling in her right thigh. She denies any new numbness or tingling in her right leg. Labs were checked at the nursing home and she was found to have a worsening anemia as well as a supratherapeutic INR. She comes back to the emergency room for evaluation  ED Course:Vitals were noted to be stable. She is not tachycardic. Blood pressure stable. INR is elevated at 4.7. Hemoglobin is down to 6.7. Serum chemistry is unrevealing. CT of the right femur, shows anterior  compartmental hematoma, probably in the vastus intermedius muscle, measuring about 700 cm in volume. No active extravasation of contrast is noted. Patient was ordered 2 units of PRBC. She is been referred for admission.  acute anemia/rightThigh hematoma in the setting of supertherapeutic INR(INR 4.78 on presentation) with acute blood loss anemia -hgb on presentation is 5.8,s/p prbc transfusionx2 on 9/8 -ortho consulted, conservative management for thigh hematoma noted -cardiology consulted for anticoagulation management in the setting of mechanical valve with recs for keeping INR on lower range between 2-2.5 -Resumed coumadin 9/12. Continue coumadin dosing per pharmacy -Per Cardiology, Goal INR is 2.0-2.5 -Cardiology has signed off -recommend close INR follow up and adjust coumadin dosing as neede  R subacute non-displaced hip fracture-  S/p RIGHT CANNULATED HIP PINNING On 9/3 with cardiology for preop eval -Presently stable  Leukocytosis: -Question stress reaction -WBC trends reviewed, stable  CAD status post mechanical aortic valve placement. - chronic combined systolic/diastolic congestive  euvolemic at this time Continue comadin dosing per pharmacy Cardiology consulted,continued on coumadin per Cardiology  CKDIII renal function stable remained at baseline  HTN - Continued Coreg, held acei for mild hypotension  HLD - Continue Lipitor as tolerated  Chronic respiratory failure from COPD, on home O2 4liters at baseline- this is due to underlying COPD, currently compensated, continue home nebulized medications and as needed nebulizer, continue oxygen.   Dementia - Continue Aricept is at risk for delirium.  -Continued to avoid over medication with narcotics and benzodiazepines.  Severe malnutrition in context of chronic illness, Underweight -Nutrition was consulted  Stage 1sacral pressure ulcer2.5 cm x 2.5 cmpresented on admission Continue  routine care as tolerated   Discharge Diagnoses:  Active Problems:   Hyperlipidemia   Essential hypertension   S/P AVR (aortic valve replacement)   Chronic respiratory failure with hypoxia, on home O2 therapy (HCC)   Hematoma of thigh, right, initial encounter   Chronic combined systolic and diastolic CHF (congestive heart failure) (HCC)   Supratherapeutic INR   Acute blood loss anemia   CKD (chronic kidney disease) stage 3, GFR 30-59 ml/min (HCC)   COPD (chronic obstructive pulmonary disease) (HCC)   Hematoma of right thigh   Pressure injury of skin    Discharge Instructions   Allergies as of 08/01/2018      Reactions   Codeine Nausea And Vomiting   Penicillins Other (See Comments)   Unknown reaction - listed on MAR Has patient had a PCN reaction causing immediate rash, facial/tongue/throat swelling, SOB or lightheadedness with hypotension: Unknown Has patient had a PCN reaction causing severe rash involving mucus membranes or skin necrosis: Unknown Has patient had a PCN reaction that required hospitalization: Unknown Has patient had a PCN reaction occurring within the last 10 years: Unknown If all of the above answers are "NO", then may proceed with Cephalosporin use.      Medication List    STOP taking these medications   benazepril 40 MG tablet Commonly known as:  LOTENSIN   enoxaparin 60 MG/0.6ML injection Commonly known as:  LOVENOX   hydrALAZINE 10 MG tablet Commonly known as:  APRESOLINE   potassium chloride 10 MEQ tablet Commonly known as:  K-DUR,KLOR-CON     TAKE these medications   acetaminophen 500 MG tablet Commonly known as:  TYLENOL Take 1 tablet (500 mg total) by mouth every 8 (eight) hours.   ALPRAZolam 0.25 MG tablet Commonly known as:  XANAX Take 1 tablet (0.25 mg total) by mouth at bedtime as needed for anxiety.   ANORO ELLIPTA 62.5-25 MCG/INH Aepb Generic drug:  umeclidinium-vilanterol Inhale 1 puff into the lungs daily.    atorvastatin 40 MG tablet Commonly known as:  LIPITOR Take 1 tablet (40 mg total) by mouth daily. What changed:  when to take this   carvedilol 6.25 MG tablet Commonly known as:  COREG Take 6.25 mg by mouth 2 (two) times daily with a meal.   docusate sodium 100 MG capsule Commonly known as:  COLACE Take 200 mg by mouth at bedtime.   donepezil 10 MG tablet Commonly known as:  ARICEPT TAKE ONE TABLET BY MOUTH ONCE DAILY. What changed:  when to take this   ergocalciferol 50000 units capsule Commonly known as:  VITAMIN D2 Take 50,000 Units by mouth every Tuesday.   ferrous sulfate 325 (65 FE) MG EC tablet Take 325 mg by mouth 2 (two) times daily.   furosemide 20 MG tablet Commonly known as:  LASIX Take 1 tablet (20 mg total) by mouth daily.   HYDROcodone-acetaminophen 5-325 MG tablet Commonly known as:  NORCO/VICODIN Take 1-2 tablets by mouth every 6 (six) hours as needed for moderate pain.   levalbuterol 0.63 MG/3ML nebulizer solution Commonly known as:  XOPENEX Take 3 mLs (0.63 mg total) by nebulization every 8 (eight) hours as needed for wheezing or shortness of breath.   meclizine 25 MG tablet Commonly known as:  ANTIVERT Take 25 mg by mouth 3 (three) times daily as needed for dizziness.   Melatonin 3 MG Tabs Take 1 tablet (3 mg total) by mouth at bedtime as needed. What changed:  reasons to take this   ondansetron 4 MG tablet Commonly known as:  ZOFRAN Take  4 mg by mouth every 8 (eight) hours as needed for nausea or vomiting.   polyethylene glycol packet Commonly known as:  MIRALAX / GLYCOLAX Take 17 g by mouth daily.   pregabalin 75 MG capsule Commonly known as:  LYRICA Take 1 capsule (75 mg total) by mouth every other day. What changed:    when to take this  additional instructions   traMADol 50 MG tablet Commonly known as:  ULTRAM Take 1 tablet (50 mg total) by mouth every 6 (six) hours as needed.   warfarin 2.5 MG tablet Commonly known as:   COUMADIN Take as directed. If you are unsure how to take this medication, talk to your nurse or doctor. Original instructions:  Take 2.5 mg by mouth every evening.      Follow-up Information    Myrene Galas, MD. Schedule an appointment as soon as possible for a visit in 7 day(s).   Specialty:  Orthopedic Surgery Contact information: [address] [place] [address] [phone]          Allergies  Allergen Reactions  . Codeine Nausea And Vomiting  . Penicillins Other (See Comments)    Unknown reaction - listed on MAR Has patient had a PCN reaction causing immediate rash, facial/tongue/throat swelling, SOB or lightheadedness with hypotension: Unknown Has patient had a PCN reaction causing severe rash involving mucus membranes or skin necrosis: Unknown Has patient had a PCN reaction that required hospitalization: Unknown Has patient had a PCN reaction occurring within the last 10 years: Unknown If all of the above answers are "NO", then may proceed with Cephalosporin use.    Consultations:  Orthopedic Surgery  Cardiology  Procedures/Studies: Dg Chest 1 View  Result Date: 07/16/2018 CLINICAL DATA:  84 year old female with history of right-sided hip fracture. Preoperative evaluation. EXAM: CHEST  1 VIEW COMPARISON:  Chest x-ray 07/02/2018. FINDINGS: Study is exceedingly limited by marked patient rotation to the right. With these limitations in mind, areas no definite acute consolidative airspace disease. No definite pleural effusions. No evidence of pulmonary edema. Heart size appears enlarged. Upper mediastinal contours are grossly distorted by patient's rotation. Aortic status post median sternotomy for CABG and aortic valve replacement. IMPRESSION: 1. Limited examination demonstrating no definite radiographic evidence of acute cardiopulmonary disease. 2. Aortic atherosclerosis. 3. Cardiomegaly. Electronically Signed   By: Trudie Reed M.D.   On:  07/16/2018 18:20   Dg Lumbar Spine Complete  Result Date: 07/16/2018 CLINICAL DATA:  84 year old currently in rehabilitation who fell approximately 3 weeks ago while walking with her walker. Progressive low back pain, RIGHT hip pain and RIGHT UPPER leg pain. Subsequent encounter. EXAM: LUMBAR SPINE - COMPLETE 4+ VIEW COMPARISON:  None. FINDINGS: Five non-rib-bearing lumbar vertebrae with anatomic alignment. No fractures. Osseous demineralization. Severe disc space narrowing at L3-4 and L4-5. Remaining disc spaces well-preserved. Severe LEFT and mild RIGHT facet degenerative changes at L4-5 and L5-S1. No pars defects. Aortoiliac atherosclerosis with evidence of a distal abdominal aortic aneurysm measuring up to approximately 3.5 cm diameter. IMPRESSION: 1. No acute osseous abnormality. 2. Osseous demineralization. 3. Severe degenerative disc disease at L3-4 and L4-5. 4. Severe LEFT and mild RIGHT facet degenerative changes at L4-5 and L5-S1. 5.  Aortic Atherosclerosis (ICD10-I70.0). 6. Aortic aneurysm NOS, measuring up to approximately 3.5 cm diameter. (ICD10-I71.9). Recommend followup by ultrasound in 3 years. This recommendation follows ACR consensus guidelines: White Paper of the ACR Incidental Findings Committee II on Vascular Findings. J Am Coll Radiol 2013; 10:789-794. Electronically Signed  By: Hulan Saas M.D.   On: 07/16/2018 12:30   Ct Hip Right Wo Contrast  Result Date: 07/16/2018 CLINICAL DATA:  Right hip fracture. EXAM: CT OF THE RIGHT HIP WITHOUT CONTRAST TECHNIQUE: Multidetector CT imaging of the right hip was performed according to the standard protocol. Multiplanar CT image reconstructions were also generated. COMPARISON:  Right hip x-rays from same day. CT right hip dated June 26, 2018. FINDINGS: Bones/Joint/Cartilage Subacute common nondisplaced fracture of the right subcapital femoral neck with mild sclerosis of the fracture line. No dislocation. Normal alignment. Small joint  effusion. Severe osteopenia. Old right superior inferior pubic rami fractures. Mild right hip osteoarthritis. The Ligaments Ligaments are suboptimally evaluated by CT. Muscles and Tendons Mild diffuse muscle atrophy. Moderate atrophy of the gluteus minimus muscle. Soft tissue No fluid collection or hematoma.  No soft tissue mass. The visualized internal pelvic contents demonstrate small right inguinal hernia now containing only fat. Sigmoid diverticulosis. Vascular calcifications. IMPRESSION: 1. Subacute, minimally impacted, subcapital right femoral neck fracture. Electronically Signed   By: Obie Dredge M.D.   On: 07/16/2018 17:54   Ct Femur Right W Wo Contrast  Result Date: 07/23/2018 CLINICAL DATA:  Swelling in the right thigh. Recent surgery 07/18/2018 for hip fracture. EXAM: CT OF THE LOWER RIGHT EXTREMITY WITHOUT CONTRAST TECHNIQUE: Multidetector CT imaging of the lower right extremity was performed following the standard protocol before and during bolus administration of intravenous contrast. COMPARISON:  Ultrasound from 07/23/2018 CONTRAST:  ISOVUE-300 IOPAMIDOL (ISOVUE-300) INJECTION 61% FINDINGS: Bones/Joint/Cartilage Small right knee joint effusion. No hip joint effusion. Three distally threaded screws traverse the subcapital femoral neck fracture. Old right pelvic fractures, healed. Clips are noted in the subcutaneous tissues medial right thigh possibly from prior saphenous vein harvesting. Ligaments Suboptimally assessed by CT. Muscles and Tendons 20.5 by 9.5 by 6.9 cm (volume = 700 cm^3) hematoma in the anterior compartment of the thigh felt to primarily be involving the vastus intermedius. There is effacement of soft tissue planes in the anterior compartment. There is also a small amount of subcutaneous hematoma lateral to the iliotibial band, measuring about 4.9 by 2.9 by 4.2 cm (volume = 31 cm^3). Soft tissues No deep vein thrombosis in the thigh is observed. IMPRESSION: 1. Anterior  compartmental hematoma in the thigh, probably in the vastus intermedius muscle, measuring about 700 cubic cm in volume. No active extravasation of contrast into this collection is noted during today's exam. There is displacement of surrounding structures and some effacement of adipose tissue in the anterior compartment. Indeterminate with regard to compartment syndrome but no obvious hypoperfusion of the muscular tissues in the anterior compartment is observed. 2. Small 30 cc hematoma in the subcutaneous tissues lateral to the iliotibial band on image 33/4. 3. Screw fixation of the prior subcapital hip fracture. 4. Small knee joint effusion. 5. Old left pelvic fractures. 6. Electronically Signed   By: Gaylyn Rong M.D.   On: 07/23/2018 13:26   US Venous Img Lower Right (dvt Study)  Result Date: 07/23/2018 CLINICAL DATA:  Nausea and vomiting. Right hip edema after fixation of a subcapital femoral neck fracture. EXAM: Right LOWER EXTREMITY VENOUS DOPPLER ULTRASOUND TECHNIQUE: Gray-scale sonography with graded compression, as well as color Doppler and duplex ultrasound were performed to evaluate the lower extremity deep venous systems from the level of the common femoral vein and including the common femoral, femoral, profunda femoral, popliteal and calf veins including the posterior tibial, peroneal and gastrocnemius veins when visible. The superficial great saphenous  vein was also interrogated. Spectral Doppler was utilized to evaluate flow at rest and with distal augmentation maneuvers in the common femoral, femoral and popliteal veins. COMPARISON:  CT of the right hip from 07/16/2018 FINDINGS: Contralateral Common Femoral Vein: Respiratory phasicity is normal and symmetric with the symptomatic side. No evidence of thrombus. Normal compressibility. Common Femoral Vein: No evidence of thrombus. Normal compressibility, respiratory phasicity and response to augmentation. Saphenofemoral Junction: No evidence of  thrombus. Normal compressibility and flow on color Doppler imaging. Profunda Femoral Vein: No evidence of thrombus. Normal compressibility and flow on color Doppler imaging. Femoral Vein: No evidence of thrombus. Normal compressibility, respiratory phasicity and response to augmentation. Popliteal Vein: No evidence of thrombus. Normal compressibility, respiratory phasicity and response to augmentation. Calf Veins: No evidence of thrombus. Normal compressibility and flow on color Doppler imaging. Superficial Great Saphenous Vein: No evidence of thrombus. Normal compressibility. Venous Reflux:  None. Other Findings: Complex 17.3 by 8.3 by 8.3 cm (volume = 620 cm^3) mass along the right mid thigh, no definite internal Doppler flow, possible hematoma. IMPRESSION: 1. No sonographic findings of deep vein thrombosis in the right lower extremity. 2. Approximately 620 cubic cm complex lesion in the right mid thigh, possibly a hematoma. By report a CT is planned for further assessment. Electronically Signed   By: Gaylyn RongWalter  Liebkemann M.D.   On: 07/23/2018 12:00   Dg C-arm 1-60 Min  Result Date: 07/18/2018 CLINICAL DATA:  Right hip surgery. EXAM: DG C-ARM 61-120 MIN; OPERATIVE RIGHT HIP WITH PELVIS COMPARISON:  07/16/2018. FINDINGS: Surgical screws in the right hip. Hardware intact. Anatomic alignment. IMPRESSION: Postsurgical changes right hip with anatomic alignment. Electronically Signed   By: Maisie Fushomas  Register   On: 07/18/2018 12:41   Dg Hip Operative Unilat With Pelvis Right  Result Date: 07/18/2018 CLINICAL DATA:  Right hip surgery. EXAM: DG C-ARM 61-120 MIN; OPERATIVE RIGHT HIP WITH PELVIS COMPARISON:  07/16/2018. FINDINGS: Surgical screws in the right hip. Hardware intact. Anatomic alignment. IMPRESSION: Postsurgical changes right hip with anatomic alignment. Electronically Signed   By: Maisie Fushomas  Register   On: 07/18/2018 12:41   Dg Hip Unilat With Pelvis 2-3 Views Right  Result Date: 07/18/2018 CLINICAL DATA:   Screw fixation for fracture EXAM: DG HIP (WITH OR WITHOUT PELVIS) 2-3V RIGHT COMPARISON:  Pelvis and right hip July 16, 2018; intraoperative images right hip July 18, 2018 FINDINGS: Frontal pelvis as well as frontal and lateral right hip images were obtained. There are 3 screws transfixing a subcapital femoral neck fracture on the right with screw tips in the proximal femoral head on the right. Alignment at fracture site is essentially anatomic. There is evidence of old trauma involving the medial right superior pubic ramus and medial right ischium with remodeling. No new fracture evident. No dislocation. There is moderate narrowing of each hip joint. There is degenerative change in the lower lumbar region. There is distal aortic and bilateral iliac artery atherosclerotic calcification. IMPRESSION: Screw fixation through a subcapital femoral neck fracture with alignment anatomic. Old fractures of the right superior pubic ramus and ischium with remodeling. Symmetric narrowing both hip joints. Aortoiliac atherosclerosis noted. Aortic Atherosclerosis (ICD10-I70.0). Electronically Signed   By: Bretta BangWilliam  Woodruff III M.D.   On: 07/18/2018 13:52   Dg Hip Unilat With Pelvis Min 4 Views Right  Result Date: 07/16/2018 CLINICAL DATA:  84 year old currently in rehabilitation who fell approximately 3 weeks ago while walking with her walker. Progressive low back pain, RIGHT hip pain and RIGHT UPPER leg pain. Subsequent  encounter. EXAM: DG HIP (WITH OR WITHOUT PELVIS) 4+V RIGHT COMPARISON:  AP pelvis and RIGHT hip x-ray 06/26/2018. RIGHT hip CT 06/26/2018. FINDINGS: Since the prior examinations 06/26/2018, development of a new sclerotic line involving the subcapital location of the RIGHT femoral neck. No evidence of acute or subacute fractures elsewhere. Mild axial joint space narrowing as noted previously. Osseous demineralization. Included AP pelvis again demonstrates remote healed fractures involving the RIGHT  SUPERIOR and INFERIOR pubic rami. Symmetric mild axial joint space narrowing in the contralateral LEFT hip. Sacroiliac joints and symphysis pubis intact without evidence of diastasis. IMPRESSION: 1. New sclerotic line involving the subcapital location of the RIGHT femoral neck, likely indicating a subacute, minimally impacted nondisplaced fracture which was inconspicuous on the prior imaging 06/26/2018. 2. Symmetric mild osteoarthritis in both hips. 3. Osseous demineralization. 4. Remote healed fractures involving the RIGHT SUPERIOR and INFERIOR pubic rami. Electronically Signed   By: Hulan Saas M.D.   On: 07/16/2018 12:23   Dg Femur, Min 2 Views Right  Result Date: 07/16/2018 CLINICAL DATA:  84 year old currently in rehabilitation who fell approximately 3 weeks ago while walking with her walker. Progressive low back pain, RIGHT hip pain and RIGHT UPPER leg pain. Subsequent encounter. EXAM: RIGHT FEMUR 2 VIEWS COMPARISON:  RIGHT hip x-rays obtained concurrently and 06/26/2018. No prior femur imaging. FINDINGS: Osseous demineralization. Sclerotic line involving the subcapital location of the RIGHT femoral neck as described in detail on the concurrent hip imaging. No fractures elsewhere involving the femur. Knee joint intact with moderate MEDIAL and LATERAL compartment joint space narrowing. IMPRESSION: 1. Subacute subcapital RIGHT femoral neck fracture as detailed on the concurrent hip imaging. 2. No fractures elsewhere involving the femur. 3. Osteoarthritis involving the RIGHT knee. Electronically Signed   By: Hulan Saas M.D.   On: 07/16/2018 12:25    Subjective: Without complaints  Discharge Exam: Vitals:   08/01/18 0520 08/01/18 1000  BP: (!) 150/60 122/69  Pulse: 66 (!) 59  Resp: 18 16  Temp: 98.1 F (36.7 C) 97.9 F (36.6 C)  SpO2: 100% 100%   Vitals:   07/31/18 1143 07/31/18 2101 08/01/18 0520 08/01/18 1000  BP:  (!) 139/55 (!) 150/60 122/69  Pulse:  74 66 (!) 59  Resp:  18  18 16   Temp:  98.2 F (36.8 C) 98.1 F (36.7 C) 97.9 F (36.6 C)  TempSrc:  Oral Oral Oral  SpO2: 97% 100% 100% 100%  Weight:      Height:        General: Pt is alert, awake, not in acute distress Cardiovascular: RRR, S1/S2 +, no rubs, no gallops Respiratory: CTA bilaterally, no wheezing, no rhonchi Abdominal: Soft, NT, ND, bowel sounds + Extremities: no edema, no cyanosis   The results of significant diagnostics from this hospitalization (including imaging, microbiology, ancillary and laboratory) are listed below for reference.     Microbiology: No results found for this or any previous visit (from the past 240 hour(s)).   Labs: BNP (last 3 results) Recent Labs    06/26/18 1935  BNP 1,329.0*   Basic Metabolic Panel: Recent Labs  Lab 07/26/18 0459  NA 136  K 4.1  CL 102  CO2 28  GLUCOSE 100*  BUN 25*  CREATININE 1.09*  CALCIUM 8.1*   Liver Function Tests: No results for input(s): AST, ALT, ALKPHOS, BILITOT, PROT, ALBUMIN in the last 168 hours. No results for input(s): LIPASE, AMYLASE in the last 168 hours. No results for input(s): AMMONIA in the last 168  hours. CBC: Recent Labs  Lab 07/27/18 0548 07/28/18 0500 07/29/18 0536 07/30/18 0756 08/01/18 0657  WBC 12.5* 12.0* 11.3* 11.2* 11.8*  HGB 8.9* 8.7* 8.1* 8.9* 8.0*  HCT 28.1* 27.6* 26.1* 29.1* 25.8*  MCV 96.9 96.8 97.0 100.7* 100.0  PLT 291 353 383 485* 574*   Cardiac Enzymes: No results for input(s): CKTOTAL, CKMB, CKMBINDEX, TROPONINI in the last 168 hours. BNP: Invalid input(s): POCBNP CBG: No results for input(s): GLUCAP in the last 168 hours. D-Dimer No results for input(s): DDIMER in the last 72 hours. Hgb A1c No results for input(s): HGBA1C in the last 72 hours. Lipid Profile No results for input(s): CHOL, HDL, LDLCALC, TRIG, CHOLHDL, LDLDIRECT in the last 72 hours. Thyroid function studies No results for input(s): TSH, T4TOTAL, T3FREE, THYROIDAB in the last 72 hours.  Invalid  input(s): FREET3 Anemia work up No results for input(s): VITAMINB12, FOLATE, FERRITIN, TIBC, IRON, RETICCTPCT in the last 72 hours. Urinalysis    Component Value Date/Time   COLORURINE STRAW (A) 07/02/2018 1214   APPEARANCEUR CLEAR 07/02/2018 1214   LABSPEC 1.009 07/02/2018 1214   PHURINE 5.0 07/02/2018 1214   GLUCOSEU NEGATIVE 07/02/2018 1214   GLUCOSEU NEGATIVE 04/10/2008 1246   HGBUR NEGATIVE 07/02/2018 1214   BILIRUBINUR NEGATIVE 07/02/2018 1214   KETONESUR NEGATIVE 07/02/2018 1214   PROTEINUR NEGATIVE 07/02/2018 1214   UROBILINOGEN 0.2 11/25/2012 0010   NITRITE NEGATIVE 07/02/2018 1214   LEUKOCYTESUR NEGATIVE 07/02/2018 1214   Sepsis Labs Invalid input(s): PROCALCITONIN,  WBC,  LACTICIDVEN Microbiology No results found for this or any previous visit (from the past 240 hour(s)).  Time spent: 30 min  SIGNED:   Rickey Barbara, MD  [facility] 08/01/2018, 2:59 PM  If 7PM-7AM, please contact night-coverage

## 2018-08-01 NOTE — Clinical Social Work Placement (Signed)
   CLINICAL SOCIAL WORK PLACEMENT  NOTE 08/01/2018 - DISCHARGED TO [facility] VIA AMBULANCE  Date:  08/01/2018  Patient Details  Name: [name] MRN: [id number] Date of Birth: [birth year]  Clinical Social Work is seeking post-discharge placement for this patient at the Skilled  Nursing Facility level of care (*CSW will initial, date and re-position this form in  chart as items are completed):  No(Patient admitted to hospital from [facility])   Patient/family provided with [facility] Clinical Social Work Department's list of facilities offering this level of care within the geographic area requested by the patient (or if unable, by the patient's family).  Yes   Patient/family informed of their freedom to choose among providers that offer the needed level of care, that participate in Medicare, Medicaid or managed care program needed by the patient, have an available bed and are willing to accept the patient.  Yes   Patient/family informed of [facility]'s ownership interest in [facility] and [facility], as well as of the fact that they are under no obligation to receive care at these facilities.  PASRR submitted to EDS on       PASRR number received on       Existing PASRR number confirmed on 07/25/18     FL2 transmitted to all facilities in geographic area requested by pt/family on       FL2 transmitted to all facilities within larger geographic area on 07/25/18     Patient informed that his/her managed care company has contracts with or will negotiate with certain facilities, including the following:        Yes   Patient/family informed of bed offers received.  Patient [facility] bed at [facility]     Physician recommends and patient chooses bed at      Patient to be transferred to [facility] on 08/01/18.  Patient to be transferred to facility by Ambulance     Patient family notified on 08/01/18 of transfer.  Name of family  member notified:  Zollie Peeonna Carter-Daughter, & Heather- granddaughter. at beside.      PHYSICIAN       Additional Comment:    _______________________________________________ Cristobal Goldmannrawford, Andrianna Manalang Bradley, LCSW 08/01/2018, 4:14 PM

## 2018-08-01 NOTE — Progress Notes (Signed)
Sutures have been taken out by RN. Will continue to monitor.   Lillia PaulsLaura B. RN

## 2018-08-01 NOTE — Progress Notes (Signed)
ANTICOAGULATION CONSULT NOTE - Follow-Up  Pharmacy Consult for Warfarin Indication: Mechanical Aortic valve  Patient Measurements: Height: 5\' 2"  (157.5 cm) Weight: 115 lb 15.4 oz (52.6 kg) IBW/kg (Calculated) : 50.1  Heparin dosing weight 52.4 kg   Vital Signs: Temp: 98.1 F (36.7 C) (09/17 0520) Temp Source: Oral (09/17 0520) BP: 150/60 (09/17 0520) Pulse Rate: 66 (09/17 0520)  Labs: Recent Labs    07/30/18 0756 07/31/18 0551 08/01/18 0657  HGB 8.9*  --  8.0*  HCT 29.1*  --  25.8*  PLT 485*  --  574*  LABPROT 26.0* 29.7* 29.8*  INR 2.40 2.85 2.87    Estimated Creatinine Clearance: 27.7 mL/min (A) (by C-G formula based on SCr of 1.09 mg/dL (H)).   Assessment: 84 yo female on Warfarin PTA for hx mech AVR + CVA. Pharmacy consulted to resume warfarin dosing this admission.   PTA dose 2.5 mg daily, received 2 doses of warf 4 mg at rehab (9/5 + 9/6) with quick jump in INR (peak 4.78 on 9/8). 9/13 INR of >10 was a lab error.  INR today remains slightly SUPRAtherapeutic of lower goal range recently specified by cardiology (INR 2.85 << 2.4, INR goal 2-2.5) despite holding the dose yesterday evening. Hgb 8 << 8.9 - will watch. No active bleeding noted. Will hold warfarin again today.    Goal of Therapy:  INR 2-2.5 Monitor platelets by anticoagulation protocol: Yes   Plan:  - Hold warfarin dose today - Will continue to monitor for any signs/symptoms of bleeding and will follow up with PT/INR in the a.m.   Thank you for allowing pharmacy to be a part of this patient's care.  Georgina PillionElizabeth Kharee Lesesne, PharmD, BCPS Clinical Pharmacist Pager: [phone] Clinical phone for 08/01/2018 from 7a-3:30p: [phone] If after 3:30p, please call main pharmacy at: [phone] Please check [facility] for all [facility] numbers 08/01/2018 10:56 AM

## 2018-08-01 NOTE — Progress Notes (Signed)
Physical Therapy Treatment Patient Details Name: Colleen CablesRuth E Simpson MRN: [id number] DOB: 28-Jan-1926 Today's Date: 08/01/2018    History of Present Illness 84yo female admitted from rehab with significant pain and swelling in her right thigh.  Labs were checked at the nursing home and she was found to have a worsening anemia as well as a supratherapeutic INR. She received R cannulated hip pinning on 07/18/18. PMH CHF, COPD, HTN, L BBB, macular degeneration, CVA, transient global amnesia, hx CABG     PT Comments    Continuing work on functional mobility and activity tolerance;  Good progress with gait pattern and efficiency; cues for R stance stability and posture; R quad weakness is improving, but still needs work; Discussed exercise progression and gave patient and daughter ideas for gentle chest opening stretches and working on posture; Overall progressing well; Anticipate continuing good progress at post-acute rehabilitation.   Follow Up Recommendations  SNF;Supervision for mobility/OOB     Equipment Recommendations  None recommended by PT    Recommendations for Other Services       Precautions / Restrictions Precautions Precautions: Fall Restrictions Weight Bearing Restrictions: Yes RLE Weight Bearing: Weight bearing as tolerated    Mobility  Bed Mobility                  Transfers Overall transfer level: Needs assistance Equipment used: Rolling walker (2 wheeled) Transfers: Sit to/from Stand Sit to Stand: Min guard         General transfer comment: Minguard for safety; cues for hand placement and to control descent to sit  Ambulation/Gait Ambulation/Gait assistance: Min guard Gait Distance (Feet): 80 Feet(x2) Assistive device: Rolling walker (2 wheeled) Gait Pattern/deviations: Step-through pattern(Emerging) Gait velocity: decreased   General Gait Details: Cues focused on standing tall on RLE in stance and activating R quad and gluteals for stability; Able to  initiate step-through pattern; min assist for stability; needs chair push as she fatigues quickly   Stairs             Wheelchair Mobility    Modified Rankin (Stroke Patients Only)       Balance     Sitting balance-Leahy Scale: Fair       Standing balance-Leahy Scale: Fair                              Cognition Arousal/Alertness: Awake/alert Behavior During Therapy: WFL for tasks assessed/performed Overall Cognitive Status: Within Functional Limits for tasks assessed                                        Exercises Total Joint Exercises Towel Squeeze: AROM;Both;10 reps Hip ABduction/ADduction: AROM;Left;10 reps Long Arc Quad: AAROM;Left;10 reps Bridges: AROM;10 reps    General Comments General comments (skin integrity, edema, etc.): Session conducted on room Air and O2 sats 97% after amb and therex      Pertinent Vitals/Pain Pain Assessment: Faces Faces Pain Scale: Hurts a little bit Pain Location: RLE increases with mobility  Pain Descriptors / Indicators: Aching(Stretching sensation) Pain Intervention(s): Monitored during session;Repositioned    Home Living                      Prior Function            PT Goals (current goals can now be found in the care  plan section) Acute Rehab PT Goals Patient Stated Goal: [facility] and get stronger  PT Goal Formulation: With patient/family Time For Goal Achievement: 08/07/18 Potential to Achieve Goals: Good Progress towards PT goals: Progressing toward goals    Frequency    Min 3X/week      PT Plan Current plan remains appropriate    Co-evaluation              AM-PAC PT "6 Clicks" Daily Activity  Outcome Measure  Difficulty turning over in bed (including adjusting bedclothes, sheets and blankets)?: A Lot Difficulty moving from lying on back to sitting on the side of the bed? : A Little Difficulty sitting down on and standing up from a  chair with arms (e.g., wheelchair, bedside commode, etc,.)?: A Little Help needed moving to and from a bed to chair (including a wheelchair)?: A Little Help needed walking in hospital room?: A Little Help needed climbing 3-5 steps with a railing? : A Lot 6 Click Score: 16    End of Session Equipment Utilized During Treatment: Gait belt Activity Tolerance: Patient tolerated treatment well Patient left: with family/visitor present;with call bell/phone within reach;in chair Nurse Communication: Mobility status PT Visit Diagnosis: Unsteadiness on feet (R26.81);Difficulty in walking, not elsewhere classified (R26.2);Muscle weakness (generalized) (M62.81);History of falling (Z91.81)     Time: 9604-5409 PT Time Calculation (min) (ACUTE ONLY): 30 min  Charges:  $Gait Training: 8-22 mins $Therapeutic Exercise: 8-22 mins                     Van Clines, PT  Acute Rehabilitation Services Pager [phone] Office [phone]    Levi Aland 08/01/2018, 3:38 PM

## 2018-08-02 ENCOUNTER — Encounter (HOSPITAL_COMMUNITY)
Admission: RE | Admit: 2018-08-02 | Discharge: 2018-08-02 | Disposition: A | Discharge status: Home / Self Care | Payer: Medicare Other | Source: Skilled Nursing Facility | Attending: Internal Medicine | Admitting: Internal Medicine

## 2018-08-02 ENCOUNTER — Other Ambulatory Visit: Payer: Self-pay

## 2018-08-02 ENCOUNTER — Encounter: Payer: Self-pay | Admitting: Internal Medicine

## 2018-08-02 ENCOUNTER — Non-Acute Institutional Stay (SKILLED_NURSING_FACILITY): Payer: Medicare Other | Admitting: Internal Medicine

## 2018-08-02 DIAGNOSIS — D62 Acute posthemorrhagic anemia: Secondary | ICD-10-CM | POA: Diagnosis not present

## 2018-08-02 DIAGNOSIS — S7011XA Contusion of right thigh, initial encounter: Secondary | ICD-10-CM

## 2018-08-02 DIAGNOSIS — J9611 Chronic respiratory failure with hypoxia: Secondary | ICD-10-CM | POA: Diagnosis not present

## 2018-08-02 DIAGNOSIS — I10 Essential (primary) hypertension: Secondary | ICD-10-CM

## 2018-08-02 DIAGNOSIS — I5042 Chronic combined systolic (congestive) and diastolic (congestive) heart failure: Secondary | ICD-10-CM | POA: Diagnosis not present

## 2018-08-02 DIAGNOSIS — Z9981 Dependence on supplemental oxygen: Secondary | ICD-10-CM

## 2018-08-02 DIAGNOSIS — Z952 Presence of prosthetic heart valve: Secondary | ICD-10-CM

## 2018-08-02 LAB — URINALYSIS, ROUTINE W REFLEX MICROSCOPIC
BACTERIA UA: NONE SEEN
Bilirubin Urine: NEGATIVE
Glucose, UA: NEGATIVE mg/dL
KETONES UR: NEGATIVE mg/dL
Leukocytes, UA: NEGATIVE
Nitrite: NEGATIVE
PROTEIN: NEGATIVE mg/dL
Specific Gravity, Urine: 1.02 (ref 1.005–1.030)
pH: 5 (ref 5.0–8.0)

## 2018-08-02 LAB — PROTIME-INR
INR: 2.49
Prothrombin Time: 26.7 seconds — ABNORMAL HIGH (ref 11.4–15.2)

## 2018-08-02 MED ORDER — ALPRAZOLAM 0.25 MG PO TABS: 0.2500 mg | ORAL_TABLET | Freq: Every evening | ORAL | 0 refills | Status: DC | PRN

## 2018-08-02 MED ORDER — HYDROCODONE-ACETAMINOPHEN 5-325 MG PO TABS: 1.0000 | ORAL_TABLET | Freq: Four times a day (QID) | ORAL | 0 refills | Status: DC | PRN

## 2018-08-02 MED ORDER — TRAMADOL HCL 50 MG PO TABS: 50.0000 mg | ORAL_TABLET | Freq: Four times a day (QID) | ORAL | 0 refills | Status: DC | PRN

## 2018-08-02 MED ORDER — PREGABALIN 75 MG PO CAPS: 75.0000 mg | ORAL_CAPSULE | ORAL | 0 refills | Status: DC

## 2018-08-02 NOTE — Telephone Encounter (Signed)
RX Fax for [facility]@ [phone]  

## 2018-08-02 NOTE — Progress Notes (Signed)
Location:    [facility] Room Number: 129/P Place of Service:  SNF [phone]) Provider: Alvy Beal, MD  Patient Care Team: Kari Baars, MD as PCP - General (Internal Medicine) Jens Som Madolyn Frieze, MD as PCP - Cardiology (Cardiology) Danella Maiers, [facility] as [facility] Care Management (Pharmacist) [name], RN as [facility] Care Management Arlyss Repress, LCSW as [facility] Care Management (Licensed Clinical Social Worker)  Extended Emergency Contact Information Primary Emergency Contact: Harland German Address: [address]          [place], [place] [address] [place] Home Phone: [phone] Work Phone: [phone] Mobile Phone: [phone] Relation: Other Secondary Emergency Contact: Greer Ee Mobile Phone: [phone] Relation: Granddaughter Preferred language: English Interpreter needed? No  Code Status:  Full Code Goals of care: Advanced Directive information Advanced Directives 08/02/2018  Does Patient Have a Medical Advance Directive? Yes  Type of Advance Directive (No Data)  Does patient want to make changes to medical advance directive? No - Patient declined  Copy of Healthcare Power of Attorney in Chart? -  Pre-existing out of facility DNR order (yellow form or pink MOST form) -     Chief Complaint  Patient presents with  . Hospitalization Follow-up    Hospitalization f/u visit  Follow-up visit after hospitalization for right upper leg hematoma with anemia- with recent history of right hip fracture with repair  HPI:  Pt is a 84 y.o. female seen today for a hospital f/u for treatment for a right upper leg hematoma status post surgery for a right hip fracture that was repaired.  With significant anemia.  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  She was recently hospitalized and discharged for a right hip fracture that was surgically repaired.  She did have postop anemia at that time and received a transfusion.  He did go back to skilled nursing on Coumadin and Lovenox bridge.  However she did develop some swelling of her right lower extremity with pain.  Updated labs were ordered which showed worsening anemia and herINR had jumped into supratherapeutic range at over 4  Hemoglobin was down to 6.7 CT of the right femur did show an anterior compartment hematoma in the vastus intermedius muscle most likely.  She was given a transfusion-surgery did see her and did not recommend surgical intervention.  Cardiology was consulted for anticoagulation management and recommended keeping on IR between 2-and 2.5.  Coumadin was resumed on September 12.  Coumadin was held yesterday INR is down to 2.49 today was 2.87 yesterday.  In regards to her nondisplaced hip fracture that was surgically repaired- she does have Norco as needed for pain with recommendation to try to minimize this use secondary to concerns for mental status changes.  She also had mild leukocytosis this was thought possibly to be more of a stress reaction  In regards to coronary artery disease again she has been restarted on her Coumadin with goal INR in the lower twos.  Her renal disease appear to be stable creatinine was 1.09 when checked on September 11 we will recheck this.  Hypertension apparently was not an issue in the hospital I got a blood pressure 120/40 today she is on Coreg her ACE inhibitor has been held  because of hypotension concerns.  Regards to COPD she continues on chronic oxygen she does have an oral Ellipta as she also has nebulizers as needed.  Currently she is resting in bed comfortably says she feels kind of weak and washed out which is  understandable vital signs appear to be stable.      Past Medical History:  Diagnosis Date  . Carotid artery stenosis    Without infarction  . Chronic combined systolic (congestive) and diastolic (congestive) heart failure (HCC)   . COPD (chronic obstructive pulmonary disease) (HCC)   . Coronary artery disease   . Heart valve replaced by other means   . Hypercholesterolemia    Pure  . Hypertension    Unspecified  . LBBB (left bundle branch block)   . Macular degeneration (senile) of retina, unspecified   . On home O2   . Postsurgical aortocoronary bypass status   . Stroke (HCC)   . Transient global amnesia   . Unspecified hereditary and idiopathic peripheral neuropathy   . Unspecified vitamin D deficiency    Past Surgical History:  Procedure Laterality Date  . AORTIC VALVE REPLACEMENT    . APPENDECTOMY    . CORONARY ARTERY BYPASS GRAFT    . HIP PINNING,CANNULATED Right 07/18/2018   Procedure: RIGHT CANNULATED HIP PINNING;  Surgeon: Myrene Galas, MD;  Location: [facility] OR;  Service: Orthopedics;  Laterality: Right;  . TONSILLECTOMY      Allergies  Allergen Reactions  . Codeine Nausea And Vomiting  . Penicillins Other (See Comments)    Unknown reaction - listed on MAR Has patient had a PCN reaction causing immediate rash, facial/tongue/throat swelling, SOB or lightheadedness with hypotension: Unknown Has patient had a PCN reaction causing severe rash involving mucus membranes or skin necrosis: Unknown Has patient had a PCN reaction that required hospitalization: Unknown Has patient had a PCN reaction occurring within the last 10 years: Unknown If all of the above answers are "NO", then may proceed with Cephalosporin use.    Allergies as of 08/02/2018      Reactions   Codeine Nausea And Vomiting   Penicillins Other (See Comments)   Unknown reaction - listed on MAR Has patient had a PCN reaction causing immediate rash, facial/tongue/throat swelling, SOB or lightheadedness  with hypotension: Unknown Has patient had a PCN reaction causing severe rash involving mucus membranes or skin necrosis: Unknown Has patient had a PCN reaction that required hospitalization: Unknown Has patient had a PCN reaction occurring within the last 10 years: Unknown If all of the above answers are "NO", then may proceed with Cephalosporin use.      Medication List        Accurate as of 08/02/18  9:09 AM. Always use your most recent med list.          acetaminophen 500 MG tablet Commonly known as:  TYLENOL Take 1 tablet (500 mg total) by mouth every 8 (eight) hours.   ALPRAZolam 0.25 MG tablet Commonly known as:  XANAX Take 1 tablet (0.25 mg total) by mouth at bedtime as needed for anxiety.   ANORO ELLIPTA 62.5-25 MCG/INH Aepb Generic drug:  umeclidinium-vilanterol Inhale 1 puff into the lungs daily.   atorvastatin 40 MG tablet Commonly known as:  LIPITOR Take 1 tablet (40 mg total) by mouth daily.   carvedilol 6.25 MG tablet Commonly known as:  COREG Take 6.25 mg by mouth 2 (two) times daily with a meal.   docusate sodium 100 MG capsule Commonly known as:  COLACE Take 200 mg by mouth at bedtime.   donepezil 10 MG tablet Commonly known as:  ARICEPT TAKE ONE TABLET BY MOUTH ONCE DAILY.   ergocalciferol 50000 units capsule Commonly known as:  VITAMIN D2 Take 50,000 Units by mouth every Tuesday.   ferrous sulfate 325 (65 FE) MG EC tablet Take 325 mg by mouth 2 (two) times daily.   furosemide 20 MG tablet Commonly known as:  LASIX Take 1 tablet (20 mg total) by mouth daily.   HYDROcodone-acetaminophen 5-325 MG tablet Commonly known as:  NORCO/VICODIN Take 1-2 tablets by mouth every 6 (six) hours as needed for moderate pain.   levalbuterol 0.63 MG/3ML nebulizer solution Commonly known as:  XOPENEX Take 3 mLs (0.63 mg total) by nebulization every 8 (eight) hours as needed for wheezing or shortness of breath.   meclizine 25 MG tablet Commonly known as:   ANTIVERT Take 25 mg by mouth 3 (three) times daily as needed for dizziness.   Melatonin 3 MG Tabs Take 1 tablet (3 mg total) by mouth at bedtime as needed.   ondansetron 4 MG tablet Commonly known as:  ZOFRAN Take 4 mg by mouth every 8 (eight) hours as needed for nausea or vomiting.   polyethylene glycol packet Commonly known as:  MIRALAX / GLYCOLAX Take 17 g by mouth daily.   pregabalin 75 MG capsule Commonly known as:  LYRICA Take 1 capsule (75 mg total) by mouth every other day.   traMADol 50 MG tablet Commonly known as:  ULTRAM Take 1 tablet (50 mg total) by mouth every 6 (six) hours as needed.   VENELEX Oint Apply to sacrum and to bilateral buttocks qshift and prn       Review of Systems   General she is not complaining of fever chills says she does feel weak.  Skin is not complaining of rashes or itching.  Head ears eyes nose mouth and throat is not complain of any sore throat or visual changes she has prescription lenses.  Respiratory is not complaining of shortness of breath or cough she is on oxygen chronically with a history of COPD.  Cardiac is not complaining of chest pain does not really appear to have significant lower extremity edema.  GI is not complaining of abdominal discomfort nausea vomiting diarrhea or constipation says her appetite is getting better.  GU is not complaining of dysuria.  Musculoskeletal says she still has some pain right lower extremity-apparently pain medication does help but will have to be watched for increased lethargy or mental status changes.  Neurologic is not complaining of dizziness headache does complain of weakness.  Psych does not complain of being overtly depressed or anxious appears somewhat frustrated with her setbacks but not overtly depressed  Immunization History  Administered Date(s) Administered  . Influenza,inj,Quad PF,6+ Mos 07/25/2018  . Influenza-Unspecified 08/21/2015  . Pneumococcal Conjugate-13  08/21/2014  . Pneumococcal-Unspecified 08/21/2015  . Tdap 11/12/2013  . Zoster 08/31/2012   Pertinent  Health Maintenance Due  Topic Date Due  . DEXA SCAN  08/03/2018 (Originally 11/02/1993)  . INFLUENZA VACCINE  Completed  . PNA vac Low Risk Adult  Completed   No flowsheet data found. Functional Status Survey:    Temperature is 97.8 pulse is 68-respirations of 17 blood pressure taken manually 120/40 her weight is 107 pounds  Physical Exam   In general this is a pleasant fairly well-nourished elderly female in no distress lying comfortably in bed.  Skin is warm and dry and do not  note any rashes or itching she does have apparently a stage I sacral ulcer which is followed by wound care.  Currently covered  Eyes visual acuity appears to be intact sclera and conjunctive are clear she has prescription lenses.  Oropharynx clear mucous membranes moist.  Chest is clear to auscultation with somewhat shallow air entry there is no labored breathing.  Heart is regular rate and rhythm with a baseline systolic murmur she does not really have significant lower extremity edema pedal pulses are intact.  Abdomen is soft nontender with positive bowel sounds.  \REctal--does have some external hemorrhoids--no bleeding  Musculoskeletal Limited exam since she is in bed but appears able to move all extremities x4 with limitations of her right lower extremity with recent surgery and hematoma- grip strength is strong bilaterally.  Neurologic is grossly intact her speech is clear no lateralizing findings touch sensation appears to be intact lower extremities bilaterally.  Continues to have some increased size of her right thigh but I do not really note bruising there is still some tenderness  Psych she is alert and oriented pleasant and appropriate  Labs reviewed: Recent Labs    06/27/18 0018  07/20/18 0422 07/21/18 0441  07/23/18 1051 07/24/18 0553 07/26/18 0459  NA  --    < > 141 140   <  > 140 140 136  K  --    < > 3.7 3.1*   < > 4.7 4.6 4.1  CL  --    < > 106 104   < > 103 105 102  CO2  --    < > 29 29   < > 30 28 28   GLUCOSE  --    < > 95 96   < > 125* 103* 100*  BUN  --    < > 18 20   < > 20 19 25*  CREATININE  --    < > 0.97 1.00   < > 1.00 0.99 1.09*  CALCIUM  --    < > 8.7* 8.6*  8.4*   < > 8.7* 8.6* 8.1*  MG 1.9  --  1.9 1.9  --   --   --   --   PHOS  --   --   --  2.8  --   --   --   --    < > = values in this interval not displayed.   Recent Labs    06/26/18 1935 07/02/18 1147  AST 36 26  ALT 24 19  ALKPHOS 73 70  BILITOT 0.7 0.6  PROT 6.5 6.4*  ALBUMIN 3.5 3.2*   Recent Labs    07/16/18 1617  07/22/18 0830  07/23/18 1051  07/29/18 0536 07/30/18 0756 08/01/18 0657  WBC 6.9   < > 9.8   < > 12.0*   < > 11.3* 11.2* 11.8*  NEUTROABS 4.6  --  7.6  --  9.2*  --   --   --   --   HGB 8.7*   < > 7.4*   < > 6.7*   < > 8.1* 8.9* 8.0*  HCT 28.5*   < > 23.4*   < > 20.9*   < > 26.1* 29.1* 25.8*  MCV 97.3   < > 94.7   < > 96.8   < > 97.0 100.7* 100.0  PLT 283   < > 195   < > 175   < > 383 485* 574*   < > =  values in this interval not displayed.   Lab Results  Component Value Date   TSH 1.942 07/21/2018   No results found for: HGBA1C Lab Results  Component Value Date   CHOL 151 07/03/2018   HDL 56 07/03/2018   LDLCALC 84 07/03/2018   TRIG 57 07/03/2018   CHOLHDL 2.7 07/03/2018    Significant Diagnostic Results in last 30 days:  No results found.  Assessment/Plan  #1-anemia with history of right thigh hematoma- again she did require transfusion-hemoglobin did rise hemoglobin yesterday was 8.0 it appears recent levels have been in the low mid eights range-will have this updated tomorrow- she is on iron.  Clinically appears to be stable but fragile.  Continues to have some increased size of her right thigh  2.  History of aortic valve replacement mechanical-again she has been evaluated by cardiology with goal INR from 2.0-2.5-INR is down to 2.49  today- will continue current Coumadin dose of 2.5 mg after it was held last night and will have this updated tomorrow  #3- history of right hip fracture repair again she will need PT and OT she does have Norco and tramadol as needed for pain recommendation to try to minimize this secondary to patient's advanced age frailty and concerns for mental status changes.  4.-History of combined CHF with a listed ejection fraction of 35% she continues on low-dose Lasix will update a metabolic panel tomorrow so far shows stability also continue to monitor weights closely-she does not really have significant edema and lung sounds are clear today but this will have to be watched.  5.  COPD chronic respiratory failure she is on chronic oxygen as well aAnuro Ellipta--and Xopenex as needed at this point will monitor at this point appears to be stable.  6.  History of hypertension continues on Coreg again her ACE has been held because of hypotension concerns blood pressure was stable relatively at 120/40 this afternoon.  7.  History of dementia this appears to be quite mild she is on Aricept with caution advised for using narcotics and benzodiazepines as noted above.  8.  History of anxiety she continues on low-dose PRN Xanax at night-again she apparently has required this for some time and is been fairly well tolerated.  9.  History of neuropathy she is on Lyrica every other day at this point will monitor.  10.  History of hyperlipidemia she continues on Lipitor.  #11 hemorrhoids-we will treat with Anusol as needed and monitor.   Again will give her Coumadin tonight at her normal dose 2.5 mg apparently she did not receive a dose yesterday and her INR did come down-this will have to be monitored closely however will check an INR tomorrow-also will update a CBC secondary to her anemia as well as a metabolic panel for updated values.  [id number] note greater than 40 minutes spent assessing patient-reviewing  her chart and labs and coordinating and formulating a plan of care for numerous diagnoses- of note greater than 50% of time spent coordinating a plan of care  Addendum later in the day patient did complain to her daughter of some dysuria so will obtain a urinalysis and culture as well

## 2018-08-03 ENCOUNTER — Encounter (HOSPITAL_COMMUNITY)
Admission: RE | Admit: 2018-08-03 | Discharge: 2018-08-03 | Disposition: A | Discharge status: Home / Self Care | Payer: Medicare Other | Source: Skilled Nursing Facility | Attending: Internal Medicine | Admitting: Internal Medicine

## 2018-08-03 ENCOUNTER — Encounter: Payer: Self-pay | Admitting: Internal Medicine

## 2018-08-03 ENCOUNTER — Non-Acute Institutional Stay (SKILLED_NURSING_FACILITY): Payer: Medicare Other | Admitting: Internal Medicine

## 2018-08-03 DIAGNOSIS — J418 Mixed simple and mucopurulent chronic bronchitis: Secondary | ICD-10-CM

## 2018-08-03 DIAGNOSIS — N183 Chronic kidney disease, stage 3 unspecified: Secondary | ICD-10-CM

## 2018-08-03 DIAGNOSIS — I5042 Chronic combined systolic (congestive) and diastolic (congestive) heart failure: Secondary | ICD-10-CM

## 2018-08-03 DIAGNOSIS — D508 Other iron deficiency anemias: Secondary | ICD-10-CM

## 2018-08-03 DIAGNOSIS — Z952 Presence of prosthetic heart valve: Secondary | ICD-10-CM | POA: Diagnosis not present

## 2018-08-03 DIAGNOSIS — R791 Abnormal coagulation profile: Secondary | ICD-10-CM | POA: Diagnosis not present

## 2018-08-03 LAB — BASIC METABOLIC PANEL
Anion gap: 6 (ref 5–15)
BUN: 20 mg/dL (ref 8–23)
CO2: 31 mmol/L (ref 22–32)
Calcium: 9.1 mg/dL (ref 8.9–10.3)
Chloride: 105 mmol/L (ref 98–111)
Creatinine, Ser: 0.86 mg/dL (ref 0.44–1.00)
GFR calc Af Amer: >60 mL/min (ref >60)
GFR, EST NON AFRICAN AMERICAN: 58 mL/min — AB (ref >60)
GLUCOSE: 155 mg/dL — AB (ref 70–99)
POTASSIUM: 3.7 mmol/L (ref 3.5–5.1)
Sodium: 142 mmol/L (ref 135–145)

## 2018-08-03 LAB — CBC WITH DIFFERENTIAL/PLATELET
BASOS PCT: 0 %
Basophils Absolute: 0 10*3/uL (ref 0.0–0.1)
Eosinophils Absolute: 0.1 10*3/uL (ref 0.0–0.7)
Eosinophils Relative: 1 %
HCT: 28.8 % — ABNORMAL LOW (ref 36.0–46.0)
Hemoglobin: 8.5 g/dL — ABNORMAL LOW (ref 12.0–15.0)
Lymphocytes Relative: 11 %
Lymphs Abs: 1.2 10*3/uL (ref 0.7–4.0)
MCH: 29.9 pg (ref 26.0–34.0)
MCHC: 29.5 g/dL — AB (ref 30.0–36.0)
MCV: 101.4 fL — ABNORMAL HIGH (ref 78.0–100.0)
MONOS PCT: 6 %
Monocytes Absolute: 0.7 10*3/uL (ref 0.1–1.0)
Neutro Abs: 9.3 10*3/uL — ABNORMAL HIGH (ref 1.7–7.7)
Neutrophils Relative %: 82 %
Platelets: 727 10*3/uL — ABNORMAL HIGH (ref 150–400)
RBC: 2.84 MIL/uL — ABNORMAL LOW (ref 3.87–5.11)
RDW: 15.5 % (ref 11.5–15.5)
WBC: 11.3 10*3/uL — ABNORMAL HIGH (ref 4.0–10.5)

## 2018-08-03 LAB — PROTIME-INR
INR: 2.15
Prothrombin Time: 23.8 seconds — ABNORMAL HIGH (ref 11.4–15.2)

## 2018-08-03 NOTE — Progress Notes (Signed)
Provider:Akiva Brassfield Chales Abrahams MD   Location:    [facility] Room Number: 129/P Place of Service:  SNF (31)  PCP: Kari Baars, MD Patient Care Team: Kari Baars, MD as PCP - General (Internal Medicine) Jens Som Madolyn Frieze, MD as PCP - Cardiology (Cardiology) Danella Maiers, [facility] as [facility] Care Management (Pharmacist) [name], RN as [facility] Care Management Arlyss Repress, LCSW as [facility] Care Management (Licensed Clinical Social Worker)  Extended Emergency Contact Information Primary Emergency Contact: Harland German Address: [address]          [place], [place] [address] [place] Home Phone: [phone] Work Phone: [phone] Mobile Phone: [phone] Relation: Other Secondary Emergency Contact: Greer Ee Mobile Phone: [phone] Relation: Granddaughter Preferred language: English Interpreter needed? No  Code Status: Full Code Goals of Care: Advanced Directive information Advanced Directives 08/03/2018  Does Patient Have a Medical Advance Directive? Yes  Type of Advance Directive (No Data)  Does patient want to make changes to medical advance directive? No - Patient declined  Copy of Healthcare Power of Attorney in Chart? -  Pre-existing out of facility DNR order (yellow form or pink MOST form) -      Chief Complaint  Patient presents with  . Readmit To SNF    Readmission Visit    HPI: Patient is a 84 y.o. female seen today for Readmission to Facility For rehab.  Patient has h/o CVA; HTN; HLD; CADs/p CABG with aortic valve replacement on Chronic Coumadin, chronic combined heart failure (EF 50-55%, grade 1 diastolic dysfunction); and COPD on home O2 Patient was intially admitted to SNF after Fall . At that time her CT scan of Right Hip was negative. But repeat Xray was done due to persistent Pain and it was positive for Right Hip Fracture. She underwent  Right Cannulation of hip on 09/03. She also had Pos top Anemia Requiring Transfusion. She was again send to the Hospital and was admitted with Anemia HGB of 6.7 and INR elevated to 4.7 with Right Leg Swelling. She was found to have Right thigh Hematoma. . Which was managed Conservatively. She is now back to SNF for therapy. Patient also had Acute Diastolic failure in first Hospitalization. Her Lasix was increased and she was started on Hydralazine. But she did not tolerate due to Hypotension and now she is off Hydralazine and her lasix is back to home dose. Patient is doing well in facility. She was already walking with Dan Humphreys and assist in the hospital is WBAT. Her swelling is better.        Past Medical History:  Diagnosis Date  . Carotid artery stenosis    Without infarction  . Chronic combined systolic (congestive) and diastolic (congestive) heart failure (HCC)   . COPD (chronic obstructive pulmonary disease) (HCC)   . Coronary artery disease   . Heart valve replaced by other means   . Hypercholesterolemia    Pure  . Hypertension    Unspecified  . LBBB (left bundle branch block)   . Macular degeneration (senile) of retina, unspecified   . On home O2   . Postsurgical aortocoronary bypass status   . Stroke (HCC)   . Transient global amnesia   . Unspecified hereditary and idiopathic peripheral neuropathy   . Unspecified vitamin D deficiency    Past Surgical History:  Procedure Laterality Date  . AORTIC VALVE REPLACEMENT    . APPENDECTOMY    . CORONARY ARTERY BYPASS  GRAFT    . HIP PINNING,CANNULATED Right 07/18/2018   Procedure: RIGHT CANNULATED HIP PINNING;  Surgeon: Myrene Galas, MD;  Location: [facility] OR;  Service: Orthopedics;  Laterality: Right;  . TONSILLECTOMY      reports that she quit smoking about 26 years ago. Her smoking use included cigarettes. She smoked 0.50 packs per day. She has never used smokeless tobacco. She reports that she does not drink alcohol or use  drugs. Social History   Socioeconomic History  . Marital status: Widowed    Spouse name: Not on file  . Number of children: 2  . Years of education: 12th  . Highest education level: Not on file  Occupational History    Employer: RETIRED  Social Needs  . Financial resource strain: Not on file  . Food insecurity:    Worry: Not on file    Inability: Not on file  . Transportation needs:    Medical: Not on file    Non-medical: Not on file  Tobacco Use  . Smoking status: Former Smoker    Packs/day: 0.50    Types: Cigarettes    Last attempt to quit: 11/30/1991    Years since quitting: 26.6  . Smokeless tobacco: Never Used  . Tobacco comment: Tobacco use-no  Substance and Sexual Activity  . Alcohol use: No  . Drug use: No  . Sexual activity: Never  Lifestyle  . Physical activity:    Days per week: Not on file    Minutes per session: Not on file  . Stress: Not on file  Relationships  . Social connections:    Talks on phone: Not on file    Gets together: Not on file    Attends religious service: Not on file    Active member of club or organization: Not on file    Attends meetings of clubs or organizations: Not on file    Relationship status: Not on file  . Intimate partner violence:    Fear of current or ex partner: Not on file    Emotionally abused: Not on file    Physically abused: Not on file    Forced sexual activity: Not on file  Other Topics Concern  . Not on file  Social History Narrative   Pt lives at home alone.   Caffeine Use: very rarely    Functional Status Survey:    Family History  Problem Relation Age of Onset  . Heart attack Father   . Stroke Sister   . Neuropathy Brother   . COPD Sister     Health Maintenance  Topic Date Due  . DEXA SCAN  08/03/2018 (Originally 11/02/1993)  . TETANUS/TDAP  11/13/2023  . INFLUENZA VACCINE  Completed  . PNA vac Low Risk Adult  Completed    Allergies  Allergen Reactions  . Codeine Nausea And Vomiting  .  Penicillins Other (See Comments)    Unknown reaction - listed on MAR Has patient had a PCN reaction causing immediate rash, facial/tongue/throat swelling, SOB or lightheadedness with hypotension: Unknown Has patient had a PCN reaction causing severe rash involving mucus membranes or skin necrosis: Unknown Has patient had a PCN reaction that required hospitalization: Unknown Has patient had a PCN reaction occurring within the last 10 years: Unknown If all of the above answers are "NO", then may proceed with Cephalosporin use.    Outpatient Encounter Medications as of 08/03/2018  Medication Sig  . acetaminophen (TYLENOL) 500 MG tablet Take 1 tablet (500 mg total) by mouth  every 8 (eight) hours.  . ALPRAZolam (XANAX) 0.25 MG tablet Take 1 tablet (0.25 mg total) by mouth at bedtime as needed for anxiety.  Marland Kitchen. atorvastatin (LIPITOR) 40 MG tablet Take 1 tablet (40 mg total) by mouth daily.  Lucilla Lame. Balsam [place]-Castor Oil (VENELEX) OINT Apply to sacrum and to bilateral buttocks qshift and prn  . carvedilol (COREG) 6.25 MG tablet Take 6.25 mg by mouth 2 (two) times daily with a meal.  . docusate sodium (COLACE) 100 MG capsule Take 200 mg by mouth at bedtime.  . donepezil (ARICEPT) 10 MG tablet TAKE ONE TABLET BY MOUTH ONCE DAILY.  . ergocalciferol (VITAMIN D2) 50000 UNITS capsule Take 50,000 Units by mouth every Tuesday.   . ferrous sulfate 325 (65 FE) MG EC tablet Take 325 mg by mouth 2 (two) times daily.  . furosemide (LASIX) 20 MG tablet Take 1 tablet (20 mg total) by mouth daily.  Marland Kitchen. HYDROcodone-acetaminophen (NORCO/VICODIN) 5-325 MG tablet Take 1-2 tablets by mouth every 6 (six) hours as needed for moderate pain.  . hydrocortisone (ANUSOL-HC) 2.5 % rectal cream Place 1 application rectally 3 (three) times daily as needed for hemorrhoids or anal itching.  . levalbuterol (XOPENEX) 0.63 MG/3ML nebulizer solution Take 3 mLs (0.63 mg total) by nebulization every 8 (eight) hours as needed for wheezing or  shortness of breath.  . meclizine (ANTIVERT) 25 MG tablet Take 25 mg by mouth 3 (three) times daily as needed for dizziness.  . Melatonin 3 MG TABS Take 1 tablet (3 mg total) by mouth at bedtime as needed.  . ondansetron (ZOFRAN) 4 MG tablet Take 4 mg by mouth every 8 (eight) hours as needed for nausea or vomiting.  . polyethylene glycol (MIRALAX / GLYCOLAX) packet Take 17 g by mouth daily.  . pregabalin (LYRICA) 75 MG capsule Take 1 capsule (75 mg total) by mouth every other day.  . traMADol (ULTRAM) 50 MG tablet Take 1 tablet (50 mg total) by mouth every 6 (six) hours as needed.  . umeclidinium-vilanterol (ANORO ELLIPTA) 62.5-25 MCG/INH AEPB Inhale 1 puff into the lungs daily.   Marland Kitchen. warfarin (COUMADIN) 2.5 MG tablet Take 2.5 mg by mouth daily.   No facility-administered encounter medications on file as of 08/03/2018.      Review of Systems  Review of Systems  Constitutional: Negative for activity change, appetite change, chills, diaphoresis, fatigue and fever.  HENT: Negative for mouth sores, postnasal drip, rhinorrhea, sinus pain and sore throat.   Respiratory: Negative for apnea, cough, chest tightness, shortness of breath and wheezing.   Cardiovascular: Negative for chest pain, palpitations and leg swelling.  Gastrointestinal: Negative for abdominal distention, abdominal pain, constipation, nausea and vomiting.  Genitourinary: Negative for dysuria and frequency.  Musculoskeletal: Negative for arthralgias, joint swelling and myalgias.  Skin: Negative for rash.  Neurological: Negative for dizziness, syncope, weakness, light-headedness and numbness.  Psychiatric/Behavioral: Negative for behavioral problems, confusion and sleep disturbance.    Vitals:   08/03/18 1632  BP: (!) 110/56  Pulse: (!) 54  Resp: 16  Temp: 97.8 F (36.6 C)   There is no height or weight on file to calculate BMI. Physical Exam  Constitutional: She is oriented to person, place, and time. She appears  well-developed and well-nourished.  HENT:  Head: Normocephalic.  Mouth/Throat: Oropharynx is clear and moist.  Eyes: Pupils are equal, round, and reactive to light.  Neck: Neck supple.  Cardiovascular: Normal rate.  Murmur heard. Pulmonary/Chest: Effort normal and breath sounds normal. No stridor. No respiratory  distress. She has no wheezes.  Abdominal: Soft. Bowel sounds are normal. She exhibits no distension. There is no tenderness. There is no guarding.  Musculoskeletal: She exhibits no edema.  Neurological: She is alert and oriented to person, place, and time.  No Focal Deficits  Skin: Skin is warm.  Psychiatric: She has a normal mood and affect. Her behavior is normal. Thought content normal.    Labs reviewed: Basic Metabolic Panel: Recent Labs    06/27/18 0018  07/20/18 0422 07/21/18 0441  07/24/18 0553 07/26/18 0459 08/03/18 0757  NA  --    < > 141 140   < > 140 136 142  K  --    < > 3.7 3.1*   < > 4.6 4.1 3.7  CL  --    < > 106 104   < > 105 102 105  CO2  --    < > 29 29   < > 28 28 31   GLUCOSE  --    < > 95 96   < > 103* 100* 155*  BUN  --    < > 18 20   < > 19 25* 20  CREATININE  --    < > 0.97 1.00   < > 0.99 1.09* 0.86  CALCIUM  --    < > 8.7* 8.6*  8.4*   < > 8.6* 8.1* 9.1  MG 1.9  --  1.9 1.9  --   --   --   --   PHOS  --   --   --  2.8  --   --   --   --    < > = values in this interval not displayed.   Liver Function Tests: Recent Labs    06/26/18 1935 07/02/18 1147  AST 36 26  ALT 24 19  ALKPHOS 73 70  BILITOT 0.7 0.6  PROT 6.5 6.4*  ALBUMIN 3.5 3.2*   Recent Labs    07/02/18 1147  LIPASE 59*   No results for input(s): AMMONIA in the last 8760 hours. CBC: Recent Labs    07/22/18 0830  07/23/18 1051  07/30/18 0756 08/01/18 0657 08/03/18 0757  WBC 9.8   < > 12.0*   < > 11.2* 11.8* 11.3*  NEUTROABS 7.6  --  9.2*  --   --   --  9.3*  HGB 7.4*   < > 6.7*   < > 8.9* 8.0* 8.5*  HCT 23.4*   < > 20.9*   < > 29.1* 25.8* 28.8*  MCV 94.7   <  > 96.8   < > 100.7* 100.0 101.4*  PLT 195   < > 175   < > 485* 574* 727*   < > = values in this interval not displayed.   Cardiac Enzymes: Recent Labs    06/27/18 0614 06/27/18 1240 07/02/18 1147  TROPONINI 0.06* 0.05* 0.04*   BNP: Invalid input(s): POCBNP No results found for: HGBA1C Lab Results  Component Value Date   TSH 1.942 07/21/2018   Lab Results  Component Value Date   VITAMINB12 220 07/12/2018   Lab Results  Component Value Date   FOLATE 30.8 07/12/2018   Lab Results  Component Value Date   IRON 39 07/12/2018   TIBC 357 07/12/2018   FERRITIN 36 07/12/2018    Imaging and Procedures obtained prior to SNF admission: No results found.  Assessment/Plan  Hematoma of Right Thigh  Conservative Management Keep INR close to 2-2.5 Monitor  CBC Coumadin Management gets Supratherapeutic  Repeat INR is therapeutic Continue Low dose of Coumadin Repeat INR on Mon  S/p Right Hip Repair Pain Controlled on Tylenol and Ultram PRN. WBAT On Coumadin Follow with Ortho Started on Therapy Chronic Diastolic CHF Daily weights On her home dose of Lasix Euvolemic right now On Coreg and did not tolerate Hydralazine COPD Continue on her bronchodilators On Chronic oxygen CKD Creat near Baseline S/P AVR (aortic valve replacement) Continue Coumadin INR goal 2-2.5 Anemia Due to Hematoma bleed On Iron Dysuria Resolved and Urine was negative   Family/ staff Communication:   Labs/tests ordered:  Total time spent in this patient care encounter was 45_ minutes; greater than 50% of the visit spent counseling patient, reviewing records , Labs and coordinating care for problems addressed at this encounter.

## 2018-08-04 LAB — URINE CULTURE: Culture: NO GROWTH

## 2018-08-05 ENCOUNTER — Encounter (HOSPITAL_COMMUNITY)
Admission: RE | Admit: 2018-08-05 | Discharge: 2018-08-05 | Disposition: A | Discharge status: Home / Self Care | Payer: Medicare Other | Source: Skilled Nursing Facility

## 2018-08-05 LAB — HEMOGLOBIN AND HEMATOCRIT, BLOOD
HCT: 24.9 % — ABNORMAL LOW (ref 36.0–46.0)
Hemoglobin: 7.6 g/dL — ABNORMAL LOW (ref 12.0–15.0)

## 2018-08-05 LAB — PROTIME-INR
INR: 2.57
Prothrombin Time: 27.4 seconds — ABNORMAL HIGH (ref 11.4–15.2)

## 2018-08-06 ENCOUNTER — Other Ambulatory Visit (HOSPITAL_COMMUNITY)
Admission: RE | Admit: 2018-08-06 | Discharge: 2018-08-06 | Disposition: A | Discharge status: Home / Self Care | Payer: Medicare Other | Source: Skilled Nursing Facility | Attending: Internal Medicine | Admitting: Internal Medicine

## 2018-08-06 DIAGNOSIS — Z7901 Long term (current) use of anticoagulants: Secondary | ICD-10-CM | POA: Insufficient documentation

## 2018-08-06 LAB — CBC WITH DIFFERENTIAL/PLATELET
BASOS ABS: 0 10*3/uL (ref 0.0–0.1)
Basophils Relative: 0 %
Eosinophils Absolute: 0.2 10*3/uL (ref 0.0–0.7)
Eosinophils Relative: 2 %
HCT: 24.5 % — ABNORMAL LOW (ref 36.0–46.0)
Hemoglobin: 7.6 g/dL — ABNORMAL LOW (ref 12.0–15.0)
LYMPHS ABS: 1.5 10*3/uL (ref 0.7–4.0)
LYMPHS PCT: 17 %
MCH: 31.1 pg (ref 26.0–34.0)
MCHC: 31 g/dL (ref 30.0–36.0)
MCV: 100.4 fL — AB (ref 78.0–100.0)
Monocytes Absolute: 0.9 10*3/uL (ref 0.1–1.0)
Monocytes Relative: 11 %
NEUTROS ABS: 5.9 10*3/uL (ref 1.7–7.7)
Neutrophils Relative %: 70 %
Platelets: 574 10*3/uL — ABNORMAL HIGH (ref 150–400)
RBC: 2.44 MIL/uL — AB (ref 3.87–5.11)
RDW: 16.3 % — ABNORMAL HIGH (ref 11.5–15.5)
WBC: 8.5 10*3/uL (ref 4.0–10.5)

## 2018-08-06 LAB — PROTIME-INR
INR: 2.67
Prothrombin Time: 28.2 seconds — ABNORMAL HIGH (ref 11.4–15.2)

## 2018-08-07 ENCOUNTER — Other Ambulatory Visit: Payer: Self-pay | Admitting: *Deleted

## 2018-08-07 ENCOUNTER — Ambulatory Visit: Payer: Self-pay | Admitting: Pharmacist

## 2018-08-07 ENCOUNTER — Encounter (HOSPITAL_COMMUNITY)
Admission: RE | Admit: 2018-08-07 | Discharge: 2018-08-07 | Disposition: A | Discharge status: Home / Self Care | Payer: Medicare Other | Source: Skilled Nursing Facility | Attending: Internal Medicine | Admitting: Internal Medicine

## 2018-08-07 DIAGNOSIS — Z4789 Encounter for other orthopedic aftercare: Secondary | ICD-10-CM | POA: Insufficient documentation

## 2018-08-07 DIAGNOSIS — S72001D Fracture of unspecified part of neck of right femur, subsequent encounter for closed fracture with routine healing: Secondary | ICD-10-CM | POA: Diagnosis not present

## 2018-08-07 DIAGNOSIS — M96841 Postprocedural hematoma of a musculoskeletal structure following other procedure: Secondary | ICD-10-CM | POA: Insufficient documentation

## 2018-08-07 NOTE — Patient Outreach (Signed)
[facility] Care Management  08/07/2018  [name] [birth year] [id number]   CSW had received referral from [facility], Amber that patient had admitted to the hospital and was [facility] CSW attempted to meet with patient at SNF, but patient was out of the building with her family. [facility] & plan second attempt to visit next week.    [name], LCSW [facility]  Clinical Social Worker cell #: [phone]

## 2018-08-08 ENCOUNTER — Encounter (HOSPITAL_COMMUNITY)
Admission: RE | Admit: 2018-08-08 | Discharge: 2018-08-08 | Disposition: A | Discharge status: Home / Self Care | Payer: Medicare Other | Source: Skilled Nursing Facility | Attending: Internal Medicine | Admitting: Internal Medicine

## 2018-08-08 ENCOUNTER — Encounter: Payer: Self-pay | Admitting: Cardiology

## 2018-08-08 ENCOUNTER — Ambulatory Visit (INDEPENDENT_AMBULATORY_CARE_PROVIDER_SITE_OTHER): Payer: Medicare Other | Admitting: Cardiology

## 2018-08-08 VITALS — BP 100/50 | HR 62 | Ht 62.0 in | Wt 106.0 lb

## 2018-08-08 DIAGNOSIS — I6529 Occlusion and stenosis of unspecified carotid artery: Secondary | ICD-10-CM

## 2018-08-08 DIAGNOSIS — S72001D Fracture of unspecified part of neck of right femur, subsequent encounter for closed fracture with routine healing: Secondary | ICD-10-CM | POA: Insufficient documentation

## 2018-08-08 DIAGNOSIS — I2510 Atherosclerotic heart disease of native coronary artery without angina pectoris: Secondary | ICD-10-CM | POA: Diagnosis not present

## 2018-08-08 DIAGNOSIS — I5022 Chronic systolic (congestive) heart failure: Secondary | ICD-10-CM

## 2018-08-08 DIAGNOSIS — I359 Nonrheumatic aortic valve disorder, unspecified: Secondary | ICD-10-CM

## 2018-08-08 DIAGNOSIS — I10 Essential (primary) hypertension: Secondary | ICD-10-CM

## 2018-08-08 DIAGNOSIS — Z4789 Encounter for other orthopedic aftercare: Secondary | ICD-10-CM | POA: Insufficient documentation

## 2018-08-08 DIAGNOSIS — M96841 Postprocedural hematoma of a musculoskeletal structure following other procedure: Secondary | ICD-10-CM | POA: Insufficient documentation

## 2018-08-08 LAB — CBC
HCT: 29.5 % — ABNORMAL LOW (ref 36.0–46.0)
Hemoglobin: 8.9 g/dL — ABNORMAL LOW (ref 12.0–15.0)
MCH: 30.7 pg (ref 26.0–34.0)
MCHC: 30.2 g/dL (ref 30.0–36.0)
MCV: 101.7 fL — ABNORMAL HIGH (ref 78.0–100.0)
PLATELETS: 560 10*3/uL — AB (ref 150–400)
RBC: 2.9 MIL/uL — ABNORMAL LOW (ref 3.87–5.11)
RDW: 16.7 % — ABNORMAL HIGH (ref 11.5–15.5)
WBC: 8.6 10*3/uL (ref 4.0–10.5)

## 2018-08-08 LAB — PROTIME-INR
INR: 2
PROTHROMBIN TIME: 22.5 s — AB (ref 11.4–15.2)

## 2018-08-08 NOTE — Patient Instructions (Signed)
Your physician recommends that you schedule a follow-up appointment in: 6 WEEKS WITH DRCRENSHAW  

## 2018-08-10 ENCOUNTER — Other Ambulatory Visit: Payer: Self-pay | Admitting: *Deleted

## 2018-08-10 ENCOUNTER — Encounter (HOSPITAL_COMMUNITY)
Admission: RE | Admit: 2018-08-10 | Discharge: 2018-08-10 | Disposition: A | Discharge status: Home / Self Care | Payer: Medicare Other | Source: Skilled Nursing Facility | Attending: Internal Medicine | Admitting: Internal Medicine

## 2018-08-10 DIAGNOSIS — M96841 Postprocedural hematoma of a musculoskeletal structure following other procedure: Secondary | ICD-10-CM | POA: Insufficient documentation

## 2018-08-10 DIAGNOSIS — Z4789 Encounter for other orthopedic aftercare: Secondary | ICD-10-CM | POA: Insufficient documentation

## 2018-08-10 DIAGNOSIS — S72001D Fracture of unspecified part of neck of right femur, subsequent encounter for closed fracture with routine healing: Secondary | ICD-10-CM | POA: Insufficient documentation

## 2018-08-10 LAB — PROTIME-INR
INR: 1.81
PROTHROMBIN TIME: 20.8 s — AB (ref 11.4–15.2)

## 2018-08-10 NOTE — Patient Outreach (Signed)
[facility] Customer service manager [facility]) Care Management  08/10/2018  [name] 02-Apr-1926 [id number]  Call received from patient granddaughter,[name]. She is asking who she could speak to about her grandmother, [name], because she was told by the SW at facility that [facility] was discharging patient and patient is very anxious about going home as she has "been through a lot". They are wanting patient to be at facility until 08/22/18.   RNCM discussed that there are appeal rights on the discharge "[facility]" letter but Herbert Seta reports that they have not gotten a letter yet or an official discharge.   RNCM discussed that role of [facility] care management and that I was unsure of the discharge plan or date from the insurance side, but I could find out more information on how she could proceed with any questions.   Collaboration with [facility] Arlyss Queen, patient has not had a discharge date set or [facility] issued as of yet.   Call back to [facility] and said no official discharge set as of yet.  Alben Spittle. Albertha Ghee, RN, BSN, CCM  Post Acute Chartered loss adjuster [phone]) Business Cell  [phone]) Toll Free Office

## 2018-08-11 ENCOUNTER — Other Ambulatory Visit: Payer: Self-pay

## 2018-08-11 ENCOUNTER — Non-Acute Institutional Stay (SKILLED_NURSING_FACILITY): Payer: Medicare Other | Admitting: Internal Medicine

## 2018-08-11 ENCOUNTER — Encounter: Payer: Self-pay | Admitting: Internal Medicine

## 2018-08-11 DIAGNOSIS — I5042 Chronic combined systolic (congestive) and diastolic (congestive) heart failure: Secondary | ICD-10-CM | POA: Diagnosis not present

## 2018-08-11 DIAGNOSIS — S7011XA Contusion of right thigh, initial encounter: Secondary | ICD-10-CM | POA: Diagnosis not present

## 2018-08-11 DIAGNOSIS — R0602 Shortness of breath: Secondary | ICD-10-CM

## 2018-08-11 DIAGNOSIS — D508 Other iron deficiency anemias: Secondary | ICD-10-CM | POA: Diagnosis not present

## 2018-08-11 DIAGNOSIS — J418 Mixed simple and mucopurulent chronic bronchitis: Secondary | ICD-10-CM

## 2018-08-11 MED ORDER — HYDROCODONE-ACETAMINOPHEN 5-325 MG PO TABS: 1.0000 | ORAL_TABLET | Freq: Four times a day (QID) | ORAL | 0 refills | Status: DC | PRN

## 2018-08-11 NOTE — Telephone Encounter (Signed)
RX Fax for [facility]@ [phone]  

## 2018-08-11 NOTE — Progress Notes (Signed)
Location:    [facility] Room Number: 129/P Place of Service:  SNF [phone]) Provider:  Laurette Schimke, MD  Patient Care Team: Kari Baars, MD as PCP - General (Internal Medicine) Jens Som Madolyn Frieze, MD as PCP - Cardiology (Cardiology) Danella Maiers, [employer] [facility] as [facility] Care Management (Pharmacist) [name], RN as [facility] Care Management Arlyss Repress, LCSW as [facility] Care Management (Licensed Clinical Social Worker)  Extended Emergency Contact Information Primary Emergency Contact: Harland German Address: [address]          [place], [place] [address] [place] Home Phone: [phone] Work Phone: [phone] Mobile Phone: [phone] Relation: Other Secondary Emergency Contact: Greer Ee Mobile Phone: [phone] Relation: Granddaughter Preferred language: English Interpreter needed? No  Code Status:  Full Code Goals of care: Advanced Directive information Advanced Directives 08/11/2018  Does Patient Have a Medical Advance Directive? Yes  Type of Advance Directive (No Data)  Does patient want to make changes to medical advance directive? No - Patient declined  Copy of Healthcare Power of Attorney in Chart? -  Pre-existing out of facility DNR order (yellow form or pink MOST form) -     Chief Complaint  Patient presents with  . Acute Visit    Patient is being seen for SOB    HPI:  Pt is a 84 y.o. female seen today for an acute visit for complaints of transitory shortness of breath earlier today.  Patient has a history of CVA as well as hypertension hyperlipidemia coronary artery disease status post CABG with aortic valve replacement on chronic Coumadin as well as combined congestive heart failure with ejection fraction of 50-55% and grade 1 diastolic dysfunction per echo she also has a history of COPD and continues on 4 L of oxygen  chronically.    She was initially admitted here after a fall CT scan of the right hip originally was negative but repeat x-ray was done because she was having pain was positive for a right hip fracture and she underwent a right cannulation in early September.  She had postop anemia that required transfusion.  She returns skilled nursing but was sent to the hospital again with significant anemia with a hemoglobin of 6.7 and INR was elevated at over 4   She also had right leg swelling- and was found to have a right thigh hematoma that was managed conservatively.  She also had acute diastolic CHF during her initial hospitalization her Lasix was increased she was started on hydralazine but did not tolerate high urology because of hypotension she is now off this-she does continue on Lasix low-dose-and her weights appear to be stable.  Apparently she felt she was having shortness of breath earlier today her oxygen saturation was satisfactory in the 90s vital signs were stable-.  Today she appears to be resting comfortably in her bed-she does not complain of any acute shortness of breath or discomfort does appear little anxious which does occur at times   Past Medical History:  Diagnosis Date  . Carotid artery stenosis    Without infarction  . Chronic combined systolic (congestive) and diastolic (congestive) heart failure (HCC)   . COPD (chronic obstructive pulmonary disease) (HCC)   . Coronary artery disease   . Heart valve replaced by other means   . Hypercholesterolemia    Pure  . Hypertension    Unspecified  . LBBB (left bundle branch block)   .  Macular degeneration (senile) of retina, unspecified   . On home O2   . Postsurgical aortocoronary bypass status   . Stroke (HCC)   . Transient global amnesia   . Unspecified hereditary and idiopathic peripheral neuropathy   . Unspecified vitamin D deficiency    Past Surgical History:  Procedure Laterality Date  . AORTIC VALVE  REPLACEMENT    . APPENDECTOMY    . CORONARY ARTERY BYPASS GRAFT    . HIP PINNING,CANNULATED Right 07/18/2018   Procedure: RIGHT CANNULATED HIP PINNING;  Surgeon: Myrene Galas, MD;  Location: [facility] OR;  Service: Orthopedics;  Laterality: Right;  . TONSILLECTOMY      Allergies  Allergen Reactions  . Codeine Nausea And Vomiting  . Penicillins Other (See Comments)    Unknown reaction - listed on MAR Has patient had a PCN reaction causing immediate rash, facial/tongue/throat swelling, SOB or lightheadedness with hypotension: Unknown Has patient had a PCN reaction causing severe rash involving mucus membranes or skin necrosis: Unknown Has patient had a PCN reaction that required hospitalization: Unknown Has patient had a PCN reaction occurring within the last 10 years: Unknown If all of the above answers are "NO", then may proceed with Cephalosporin use.    Outpatient Encounter Medications as of 08/11/2018  Medication Sig  . acetaminophen (TYLENOL) 500 MG tablet Take 1 tablet (500 mg total) by mouth every 8 (eight) hours.  Marland Kitchen atorvastatin (LIPITOR) 40 MG tablet Take 1 tablet (40 mg total) by mouth daily.  Lucilla Lame [place]-Castor Oil (VENELEX) OINT Apply to sacrum and to bilateral buttocks qshift and prn  . carvedilol (COREG) 6.25 MG tablet Take 6.25 mg by mouth 2 (two) times daily with a meal.  . docusate sodium (COLACE) 100 MG capsule Take 200 mg by mouth at bedtime.  . donepezil (ARICEPT) 10 MG tablet TAKE ONE TABLET BY MOUTH ONCE DAILY.  . ergocalciferol (VITAMIN D2) 50000 UNITS capsule Take 50,000 Units by mouth every Tuesday.   . ferrous sulfate 325 (65 FE) MG EC tablet Take 325 mg by mouth 2 (two) times daily.  . furosemide (LASIX) 20 MG tablet Take 1 tablet (20 mg total) by mouth daily.  Marland Kitchen HYDROcodone-acetaminophen (NORCO/VICODIN) 5-325 MG tablet Take 1-2 tablets by mouth every 6 (six) hours as needed for moderate pain.  . hydrocortisone (ANUSOL-HC) 2.5 % rectal cream Place 1 application  rectally 3 (three) times daily as needed for hemorrhoids or anal itching.  . levalbuterol (XOPENEX) 0.63 MG/3ML nebulizer solution Take 3 mLs (0.63 mg total) by nebulization every 8 (eight) hours as needed for wheezing or shortness of breath.  . meclizine (ANTIVERT) 25 MG tablet Take 25 mg by mouth 3 (three) times daily as needed for dizziness.  . Melatonin 3 MG TABS Take 1 tablet (3 mg total) by mouth at bedtime as needed.  . ondansetron (ZOFRAN) 4 MG tablet Take 4 mg by mouth every 8 (eight) hours as needed for nausea or vomiting.  . polyethylene glycol (MIRALAX / GLYCOLAX) packet Take 17 g by mouth daily.  . pregabalin (LYRICA) 75 MG capsule Take 1 capsule (75 mg total) by mouth every other day.  . traMADol (ULTRAM) 50 MG tablet Take 1 tablet (50 mg total) by mouth every 6 (six) hours as needed.  . umeclidinium-vilanterol (ANORO ELLIPTA) 62.5-25 MCG/INH AEPB Inhale 1 puff into the lungs daily.   Marland Kitchen warfarin (COUMADIN) 2 MG tablet Take 2 mg by mouth daily.  . [DISCONTINUED] ALPRAZolam (XANAX) 0.25 MG tablet Take 1 tablet (0.25 mg total)  by mouth at bedtime as needed for anxiety.  . [DISCONTINUED] warfarin (COUMADIN) 2.5 MG tablet Take 2.5 mg by mouth daily.   No facility-administered encounter medications on file as of 08/11/2018.     Review of Systems   In general she is not complaining of any fever chills her weight appears to be stable.  Skin does not complain of rashes or itching.  Head ears eyes nose mouth and throat does not complain of visual changes or sore throat.  Respiratory is not really complaining of shortness of breath currently but did complain of that earlier- she is not complaining of any increased cough from baseline.  Cardiac is not complaining of chest pain or palpitations has baseline lower extremity edema.  GI is not complaining of abdominal pain nausea vomiting diarrhea constipation.  GU does not complain of dysuria currently.  Musculoskeletal is not  complaining of any acute leg or joint pain at this time does still have weakness.  Neurologic does complain of weakness does not complain of dizziness or headache or syncope at this time.   psych does appear to be a bit anxious at times does not complain of being overtly depressed  Immunization History  Administered Date(s) Administered  . Influenza,inj,Quad PF,6+ Mos 07/25/2018  . Influenza-Unspecified 08/21/2015  . Pneumococcal Conjugate-13 08/21/2014  . Pneumococcal-Unspecified 08/21/2015  . Tdap 11/12/2013  . Zoster 08/31/2012   Pertinent  Health Maintenance Due  Topic Date Due  . DEXA SCAN  09/10/2018 (Originally 11/02/1993)  . INFLUENZA VACCINE  Completed  . PNA vac Low Risk Adult  Completed   No flowsheet data found. Functional Status Survey:    Temperature is 97.3-pulse 64-respirations 22- blood pressure 139/67- O2 saturation is 95% on 4 L of oxygen chronically- weight appears stable at 107 pounds  Oxygen saturation is 95% on 4 L of oxygen chronically  Physical Exam   General this is a pleasant elderly female in no distress.  Her skin is warm and dry.  Eyes she has prescription lenses sclera and conjunctive are clear visual acuity appears to be intact.  Oropharynx is clear mucous membranes moist.  Chest does have shallow air entry could not really appreciate any overt congestion there is no labored breathing.  Heart is regular rate and rhythm with a baseline systolic murmur she has mild lower extremity edema bilaterally.-This appears a little bit more prominent in her ankles  Abdomen is soft nontender with positive bowel sounds.  Musculoskeletal is able to move all extremities x4 at baseline--.  She continues to have some increased size of her right thigh compared to the right with some mild tenderness palpation I do not see overt bruising site of the hematoma  Neurologic is grossly intact her speech is clear no lateralizing findings.  Psych she is largely  alert and oriented pleasant and appropriate   Labs reviewed: Recent Labs    06/27/18 0018  07/20/18 0422 07/21/18 0441  07/24/18 0553 07/26/18 0459 08/03/18 0757  NA  --    < > 141 140   < > 140 136 142  K  --    < > 3.7 3.1*   < > 4.6 4.1 3.7  CL  --    < > 106 104   < > 105 102 105  CO2  --    < > 29 29   < > 28 28 31   GLUCOSE  --    < > 95 96   < > 103* 100* 155*  BUN  --    < > 18 20   < > 19 25* 20  CREATININE  --    < > 0.97 1.00   < > 0.99 1.09* 0.86  CALCIUM  --    < > 8.7* 8.6*  8.4*   < > 8.6* 8.1* 9.1  MG 1.9  --  1.9 1.9  --   --   --   --   PHOS  --   --   --  2.8  --   --   --   --    < > = values in this interval not displayed.   Recent Labs    06/26/18 1935 07/02/18 1147  AST 36 26  ALT 24 19  ALKPHOS 73 70  BILITOT 0.7 0.6  PROT 6.5 6.4*  ALBUMIN 3.5 3.2*   Recent Labs    07/23/18 1051  08/03/18 0757 08/05/18 0730 08/06/18 0734 08/08/18 0705  WBC 12.0*   < > 11.3*  --  8.5 8.6  NEUTROABS 9.2*  --  9.3*  --  5.9  --   HGB 6.7*   < > 8.5* 7.6* 7.6* 8.9*  HCT 20.9*   < > 28.8* 24.9* 24.5* 29.5*  MCV 96.8   < > 101.4*  --  100.4* 101.7*  PLT 175   < > 727*  --  574* 560*   < > = values in this interval not displayed.   Lab Results  Component Value Date   TSH 1.942 07/21/2018   No results found for: HGBA1C Lab Results  Component Value Date   CHOL 151 07/03/2018   HDL 56 07/03/2018   LDLCALC 84 07/03/2018   TRIG 57 07/03/2018   CHOLHDL 2.7 07/03/2018    Significant Diagnostic Results in last 30 days:  Dg Chest 1 View  Result Date: 07/16/2018 CLINICAL DATA:  84 year old female with history of right-sided hip fracture. Preoperative evaluation. EXAM: CHEST  1 VIEW COMPARISON:  Chest x-ray 07/02/2018. FINDINGS: Study is exceedingly limited by marked patient rotation to the right. With these limitations in mind, areas no definite acute consolidative airspace disease. No definite pleural effusions. No evidence of pulmonary edema. Heart size  appears enlarged. Upper mediastinal contours are grossly distorted by patient's rotation. Aortic status post median sternotomy for CABG and aortic valve replacement. IMPRESSION: 1. Limited examination demonstrating no definite radiographic evidence of acute cardiopulmonary disease. 2. Aortic atherosclerosis. 3. Cardiomegaly. Electronically Signed   By: Trudie Reed M.D.   On: 07/16/2018 18:20   Dg Lumbar Spine Complete  Result Date: 07/16/2018 CLINICAL DATA:  84 year old currently in rehabilitation who fell approximately 3 weeks ago while walking with her walker. Progressive low back pain, RIGHT hip pain and RIGHT UPPER leg pain. Subsequent encounter. EXAM: LUMBAR SPINE - COMPLETE 4+ VIEW COMPARISON:  None. FINDINGS: Five non-rib-bearing lumbar vertebrae with anatomic alignment. No fractures. Osseous demineralization. Severe disc space narrowing at L3-4 and L4-5. Remaining disc spaces well-preserved. Severe LEFT and mild RIGHT facet degenerative changes at L4-5 and L5-S1. No pars defects. Aortoiliac atherosclerosis with evidence of a distal abdominal aortic aneurysm measuring up to approximately 3.5 cm diameter. IMPRESSION: 1. No acute osseous abnormality. 2. Osseous demineralization. 3. Severe degenerative disc disease at L3-4 and L4-5. 4. Severe LEFT and mild RIGHT facet degenerative changes at L4-5 and L5-S1. 5.  Aortic Atherosclerosis (ICD10-I70.0). 6. Aortic aneurysm NOS, measuring up to approximately 3.5 cm diameter. (ICD10-I71.9). Recommend followup by ultrasound in 3 years. This recommendation follows ACR consensus  guidelines: White Paper of the ACR Incidental Findings Committee II on Vascular Findings. J Am Coll Radiol 2013; 10:789-794. Electronically Signed   By: Hulan Saas M.D.   On: 07/16/2018 12:30   Ct Hip Right Wo Contrast  Result Date: 07/16/2018 CLINICAL DATA:  Right hip fracture. EXAM: CT OF THE RIGHT HIP WITHOUT CONTRAST TECHNIQUE: Multidetector CT imaging of the right hip was  performed according to the standard protocol. Multiplanar CT image reconstructions were also generated. COMPARISON:  Right hip x-rays from same day. CT right hip dated June 26, 2018. FINDINGS: Bones/Joint/Cartilage Subacute common nondisplaced fracture of the right subcapital femoral neck with mild sclerosis of the fracture line. No dislocation. Normal alignment. Small joint effusion. Severe osteopenia. Old right superior inferior pubic rami fractures. Mild right hip osteoarthritis. The Ligaments Ligaments are suboptimally evaluated by CT. Muscles and Tendons Mild diffuse muscle atrophy. Moderate atrophy of the gluteus minimus muscle. Soft tissue No fluid collection or hematoma.  No soft tissue mass. The visualized internal pelvic contents demonstrate small right inguinal hernia now containing only fat. Sigmoid diverticulosis. Vascular calcifications. IMPRESSION: 1. Subacute, minimally impacted, subcapital right femoral neck fracture. Electronically Signed   By: Obie Dredge M.D.   On: 07/16/2018 17:54   Ct Femur Right W Wo Contrast  Result Date: 07/23/2018 CLINICAL DATA:  Swelling in the right thigh. Recent surgery 07/18/2018 for hip fracture. EXAM: CT OF THE LOWER RIGHT EXTREMITY WITHOUT CONTRAST TECHNIQUE: Multidetector CT imaging of the lower right extremity was performed following the standard protocol before and during bolus administration of intravenous contrast. COMPARISON:  Ultrasound from 07/23/2018 CONTRAST:  ISOVUE-300 IOPAMIDOL (ISOVUE-300) INJECTION 61% FINDINGS: Bones/Joint/Cartilage Small right knee joint effusion. No hip joint effusion. Three distally threaded screws traverse the subcapital femoral neck fracture. Old right pelvic fractures, healed. Clips are noted in the subcutaneous tissues medial right thigh possibly from prior saphenous vein harvesting. Ligaments Suboptimally assessed by CT. Muscles and Tendons 20.5 by 9.5 by 6.9 cm (volume = 700 cm^3) hematoma in the anterior  compartment of the thigh felt to primarily be involving the vastus intermedius. There is effacement of soft tissue planes in the anterior compartment. There is also a small amount of subcutaneous hematoma lateral to the iliotibial band, measuring about 4.9 by 2.9 by 4.2 cm (volume = 31 cm^3). Soft tissues No deep vein thrombosis in the thigh is observed. IMPRESSION: 1. Anterior compartmental hematoma in the thigh, probably in the vastus intermedius muscle, measuring about 700 cubic cm in volume. No active extravasation of contrast into this collection is noted during today's exam. There is displacement of surrounding structures and some effacement of adipose tissue in the anterior compartment. Indeterminate with regard to compartment syndrome but no obvious hypoperfusion of the muscular tissues in the anterior compartment is observed. 2. Small 30 cc hematoma in the subcutaneous tissues lateral to the iliotibial band on image 33/4. 3. Screw fixation of the prior subcapital hip fracture. 4. Small knee joint effusion. 5. Old left pelvic fractures. 6. Electronically Signed   By: Gaylyn Rong M.D.   On: 07/23/2018 13:26   US Venous Img Lower Right (dvt Study)  Result Date: 07/23/2018 CLINICAL DATA:  Nausea and vomiting. Right hip edema after fixation of a subcapital femoral neck fracture. EXAM: Right LOWER EXTREMITY VENOUS DOPPLER ULTRASOUND TECHNIQUE: Gray-scale sonography with graded compression, as well as color Doppler and duplex ultrasound were performed to evaluate the lower extremity deep venous systems from the level of the common femoral vein and including the  common femoral, femoral, profunda femoral, popliteal and calf veins including the posterior tibial, peroneal and gastrocnemius veins when visible. The superficial great saphenous vein was also interrogated. Spectral Doppler was utilized to evaluate flow at rest and with distal augmentation maneuvers in the common femoral, femoral and popliteal  veins. COMPARISON:  CT of the right hip from 07/16/2018 FINDINGS: Contralateral Common Femoral Vein: Respiratory phasicity is normal and symmetric with the symptomatic side. No evidence of thrombus. Normal compressibility. Common Femoral Vein: No evidence of thrombus. Normal compressibility, respiratory phasicity and response to augmentation. Saphenofemoral Junction: No evidence of thrombus. Normal compressibility and flow on color Doppler imaging. Profunda Femoral Vein: No evidence of thrombus. Normal compressibility and flow on color Doppler imaging. Femoral Vein: No evidence of thrombus. Normal compressibility, respiratory phasicity and response to augmentation. Popliteal Vein: No evidence of thrombus. Normal compressibility, respiratory phasicity and response to augmentation. Calf Veins: No evidence of thrombus. Normal compressibility and flow on color Doppler imaging. Superficial Great Saphenous Vein: No evidence of thrombus. Normal compressibility. Venous Reflux:  None. Other Findings: Complex 17.3 by 8.3 by 8.3 cm (volume = 620 cm^3) mass along the right mid thigh, no definite internal Doppler flow, possible hematoma. IMPRESSION: 1. No sonographic findings of deep vein thrombosis in the right lower extremity. 2. Approximately 620 cubic cm complex lesion in the right mid thigh, possibly a hematoma. By report a CT is planned for further assessment. Electronically Signed   By: Gaylyn Rong M.D.   On: 07/23/2018 12:00   Dg C-arm 1-60 Min  Result Date: 07/18/2018 CLINICAL DATA:  Right hip surgery. EXAM: DG C-ARM 61-120 MIN; OPERATIVE RIGHT HIP WITH PELVIS COMPARISON:  07/16/2018. FINDINGS: Surgical screws in the right hip. Hardware intact. Anatomic alignment. IMPRESSION: Postsurgical changes right hip with anatomic alignment. Electronically Signed   By: Maisie Fus  Register   On: 07/18/2018 12:41   Dg Hip Unilat With Pelvis 1v Right  Result Date: 08/01/2018 CLINICAL DATA:  Follow-up of fracture EXAM: DG  HIP (WITH OR WITHOUT PELVIS) 1V RIGHT COMPARISON:  Pelvis and right hip films of 07/18/2017 FINDINGS: There is no change in pinning of the right subcapital femoral fracture. Deformity of the right pelvic ramus also is stable and appears old. Disruption of the pubic symphysis is unchanged. Mild degenerative change of the left hip is noted. The SI joints are corticated. There are degenerative changes in the lower lumbar spine. IMPRESSION: 1. Stable pinning of the right subcapital femoral fracture. 2. Stable old right pelvic rami fracture. Electronically Signed   By: Dwyane Dee M.D.   On: 08/01/2018 17:07   Dg Hip Operative Unilat With Pelvis Right  Result Date: 07/18/2018 CLINICAL DATA:  Right hip surgery. EXAM: DG C-ARM 61-120 MIN; OPERATIVE RIGHT HIP WITH PELVIS COMPARISON:  07/16/2018. FINDINGS: Surgical screws in the right hip. Hardware intact. Anatomic alignment. IMPRESSION: Postsurgical changes right hip with anatomic alignment. Electronically Signed   By: Maisie Fus  Register   On: 07/18/2018 12:41   Dg Hip Unilat With Pelvis 2-3 Views Right  Result Date: 07/18/2018 CLINICAL DATA:  Screw fixation for fracture EXAM: DG HIP (WITH OR WITHOUT PELVIS) 2-3V RIGHT COMPARISON:  Pelvis and right hip July 16, 2018; intraoperative images right hip July 18, 2018 FINDINGS: Frontal pelvis as well as frontal and lateral right hip images were obtained. There are 3 screws transfixing a subcapital femoral neck fracture on the right with screw tips in the proximal femoral head on the right. Alignment at fracture site is essentially anatomic. There is  evidence of old trauma involving the medial right superior pubic ramus and medial right ischium with remodeling. No new fracture evident. No dislocation. There is moderate narrowing of each hip joint. There is degenerative change in the lower lumbar region. There is distal aortic and bilateral iliac artery atherosclerotic calcification. IMPRESSION: Screw fixation through a  subcapital femoral neck fracture with alignment anatomic. Old fractures of the right superior pubic ramus and ischium with remodeling. Symmetric narrowing both hip joints. Aortoiliac atherosclerosis noted. Aortic Atherosclerosis (ICD10-I70.0). Electronically Signed   By: Bretta Bang III M.D.   On: 07/18/2018 13:52   Dg Hip Unilat With Pelvis Min 4 Views Right  Result Date: 07/16/2018 CLINICAL DATA:  84 year old currently in rehabilitation who fell approximately 3 weeks ago while walking with her walker. Progressive low back pain, RIGHT hip pain and RIGHT UPPER leg pain. Subsequent encounter. EXAM: DG HIP (WITH OR WITHOUT PELVIS) 4+V RIGHT COMPARISON:  AP pelvis and RIGHT hip x-ray 06/26/2018. RIGHT hip CT 06/26/2018. FINDINGS: Since the prior examinations 06/26/2018, development of a new sclerotic line involving the subcapital location of the RIGHT femoral neck. No evidence of acute or subacute fractures elsewhere. Mild axial joint space narrowing as noted previously. Osseous demineralization. Included AP pelvis again demonstrates remote healed fractures involving the RIGHT SUPERIOR and INFERIOR pubic rami. Symmetric mild axial joint space narrowing in the contralateral LEFT hip. Sacroiliac joints and symphysis pubis intact without evidence of diastasis. IMPRESSION: 1. New sclerotic line involving the subcapital location of the RIGHT femoral neck, likely indicating a subacute, minimally impacted nondisplaced fracture which was inconspicuous on the prior imaging 06/26/2018. 2. Symmetric mild osteoarthritis in both hips. 3. Osseous demineralization. 4. Remote healed fractures involving the RIGHT SUPERIOR and INFERIOR pubic rami. Electronically Signed   By: Hulan Saas M.D.   On: 07/16/2018 12:23   Dg Femur, Min 2 Views Right  Result Date: 07/16/2018 CLINICAL DATA:  84 year old currently in rehabilitation who fell approximately 3 weeks ago while walking with her walker. Progressive low back pain, RIGHT  hip pain and RIGHT UPPER leg pain. Subsequent encounter. EXAM: RIGHT FEMUR 2 VIEWS COMPARISON:  RIGHT hip x-rays obtained concurrently and 06/26/2018. No prior femur imaging. FINDINGS: Osseous demineralization. Sclerotic line involving the subcapital location of the RIGHT femoral neck as described in detail on the concurrent hip imaging. No fractures elsewhere involving the femur. Knee joint intact with moderate MEDIAL and LATERAL compartment joint space narrowing. IMPRESSION: 1. Subacute subcapital RIGHT femoral neck fracture as detailed on the concurrent hip imaging. 2. No fractures elsewhere involving the femur. 3. Osteoarthritis involving the RIGHT knee. Electronically Signed   By: Hulan Saas M.D.   On: 07/16/2018 12:25    Assessment/Plan  #1 shortness of breath with history of diastolic CHF and COPD- will update a two-view chest x-ray- clinically she appears stable she does have reduced breath sounds- will await x-ray results -She does continue on low-dose Lasix with history of diastolic CHF she is also on Coreg.  She does really remain on her bronchodilators and chronic oxygen with her history of COPD.  2.  History of hematoma right thigh-this appears to be gradually resolving-  #3 history of anemia hemoglobin continues to show improvement was 8.9 on lab done on September 24- she does continue on iron.  4- history of aortic valve replacement she continues on Coumadin INR was 1.81 yesterday adjustments were made and update INR is pending on Monday, September 30  #5 history of chronic kidney disease this has shown stability  creatinine was 0.86 BUN 28 on lab done on 19 September.   [id number]

## 2018-08-12 ENCOUNTER — Encounter: Payer: Self-pay | Admitting: Internal Medicine

## 2018-08-14 ENCOUNTER — Other Ambulatory Visit: Payer: Self-pay | Admitting: *Deleted

## 2018-08-14 ENCOUNTER — Ambulatory Visit: Payer: Self-pay | Admitting: *Deleted

## 2018-08-14 NOTE — Patient Outreach (Signed)
[facility]) Care Management  08/14/2018  [name] 1926-09-07 [id number]   CSW met with patient at [facility] (room#: 129) to discuss discharge plans. Patient reports that she plans to return home with her caregiver, [name] (who is her son-in-law's niece). Patient reports that she is unsure when exactly they are planning to discharge her but has an evaluation this Wednesday and thinks that the discharge date will be determined by the outcome of the evaluation. Patient reports that she has all the equipment she thinks she will need and is prepared but not anxious to return home. CSW provided patient with [facility] packet and patient signed [facility] consent to be scanned into Epic. CSW will follow-up with patient in 1 week unless notification received that patient has discharged home & will notify [facility] RNCM to transition of care follow-up.    Raynaldo Opitz, LCSW [facility]  Clinical Social Worker cell #: [phone]

## 2018-08-15 ENCOUNTER — Encounter (HOSPITAL_COMMUNITY)
Admission: RE | Admit: 2018-08-15 | Discharge: 2018-08-15 | Disposition: A | Discharge status: Home / Self Care | Payer: Medicare Other | Source: Skilled Nursing Facility | Attending: Internal Medicine | Admitting: Internal Medicine

## 2018-08-15 DIAGNOSIS — N183 Chronic kidney disease, stage 3 (moderate): Secondary | ICD-10-CM | POA: Insufficient documentation

## 2018-08-15 DIAGNOSIS — Z4789 Encounter for other orthopedic aftercare: Secondary | ICD-10-CM | POA: Insufficient documentation

## 2018-08-15 DIAGNOSIS — Z7901 Long term (current) use of anticoagulants: Secondary | ICD-10-CM | POA: Insufficient documentation

## 2018-08-15 DIAGNOSIS — S72001D Fracture of unspecified part of neck of right femur, subsequent encounter for closed fracture with routine healing: Secondary | ICD-10-CM | POA: Insufficient documentation

## 2018-08-15 LAB — PROTIME-INR
INR: 1.53
PROTHROMBIN TIME: 18.2 s — AB (ref 11.4–15.2)

## 2018-08-17 DIAGNOSIS — H353112 Nonexudative age-related macular degeneration, right eye, intermediate dry stage: Secondary | ICD-10-CM | POA: Diagnosis not present

## 2018-08-17 DIAGNOSIS — H353211 Exudative age-related macular degeneration, right eye, with active choroidal neovascularization: Secondary | ICD-10-CM | POA: Diagnosis not present

## 2018-08-17 DIAGNOSIS — H353221 Exudative age-related macular degeneration, left eye, with active choroidal neovascularization: Secondary | ICD-10-CM | POA: Diagnosis not present

## 2018-08-17 DIAGNOSIS — H35352 Cystoid macular degeneration, left eye: Secondary | ICD-10-CM | POA: Diagnosis not present

## 2018-08-19 ENCOUNTER — Encounter (HOSPITAL_COMMUNITY)
Admission: RE | Admit: 2018-08-19 | Discharge: 2018-08-19 | Disposition: A | Discharge status: Home / Self Care | Payer: Medicare Other | Source: Skilled Nursing Facility | Attending: *Deleted | Admitting: *Deleted

## 2018-08-19 LAB — PROTIME-INR
INR: 1.34
Prothrombin Time: 16.4 seconds — ABNORMAL HIGH (ref 11.4–15.2)

## 2018-08-21 ENCOUNTER — Non-Acute Institutional Stay (SKILLED_NURSING_FACILITY): Payer: Medicare Other | Admitting: Internal Medicine

## 2018-08-21 ENCOUNTER — Other Ambulatory Visit: Payer: Self-pay

## 2018-08-21 ENCOUNTER — Encounter: Payer: Self-pay | Admitting: Internal Medicine

## 2018-08-21 DIAGNOSIS — M25561 Pain in right knee: Secondary | ICD-10-CM | POA: Diagnosis not present

## 2018-08-21 DIAGNOSIS — I10 Essential (primary) hypertension: Secondary | ICD-10-CM

## 2018-08-21 DIAGNOSIS — I5022 Chronic systolic (congestive) heart failure: Secondary | ICD-10-CM | POA: Diagnosis not present

## 2018-08-21 NOTE — Patient Outreach (Signed)
[facility]) Care Management  08/21/2018  DANALI MARINOS 11/22/1925 [id number]    Received notification that [name] was pending discharge from [facility] on 08/19/2018. Updated notification received. Discharge date has changed to 08/22/18.   PLAN Will contact member after discharge.   Katha Cabal [facility] Community Care Manager [phone]

## 2018-08-21 NOTE — Progress Notes (Signed)
This is an acute visit.  Level of care is skilled.  Facility is [facility].  Chief complaint acute visit secondary to right knee discomfort.  History of present illness.  Patient is an 84 year old female who is complaining of some right knee pain-she says this has been somewhat persistent over the last several days.  Patient has a history of CVA as well as hypertension hyperlipidemia coronary artery disease status post CABG with aortic valve replacement on chronic Coumadin as well as combined congestive heart failure with ejection fraction of 50-55% and grade 1 diastolic dysfunction per echo she also has a history of COPD and continues on 4 L of oxygen chronically.    She was initially admitted here after a fall CT scan of the right hip originally was negative but repeat x-ray was done because she was having pain was positive for a right hip fracture and she underwent a right cannulation in early September.  She had postop anemia that required transfusion.  She returned to skilled nursing but was sent to the hospital again with significant anemia with a hemoglobin of 6.7 and INR was elevated at over 4   She also had right leg swelling- and was found to have a right thigh hematoma that was managed conservatively.  She also had acute diastolic CHF during her initial hospitalization her Lasix was increased she was started on hydralazine but did not tolerate high urology because of hypotension she is now off this-she does continue on Lasix low-dose-and her weights appear to be stable.  It was 105 today.  She continues to have some right leg and hip discomfort which is fairly chronic- she does have orders for Tylenol as well as tramadol for pain.  And hydrocodone if this is not effective although she tries to avoid using this  However she states she has some increased pain in her right knee the last several days- this extends more into her upper leg area as well.  Her vital  signs appear to be stable she is on chronic oxygen but is not complaining of any increased shortness of breath today-.  Again her main complaint today appears to be more in the knee area        Past Medical History:  Diagnosis Date  . Carotid artery stenosis    Without infarction  . Chronic combined systolic (congestive) and diastolic (congestive) heart failure (HCC)   . COPD (chronic obstructive pulmonary disease) (HCC)   . Coronary artery disease   . Heart valve replaced by other means   . Hypercholesterolemia    Pure  . Hypertension    Unspecified  . LBBB (left bundle branch block)   . Macular degeneration (senile) of retina, unspecified   . On home O2   . Postsurgical aortocoronary bypass status   . Stroke (HCC)   . Transient global amnesia   . Unspecified hereditary and idiopathic peripheral neuropathy   . Unspecified vitamin D deficiency         Past Surgical History:  Procedure Laterality Date  . AORTIC VALVE REPLACEMENT    . APPENDECTOMY    . CORONARY ARTERY BYPASS GRAFT    . HIP PINNING,CANNULATED Right 07/18/2018   Procedure: RIGHT CANNULATED HIP PINNING;  Surgeon: Myrene Galas, MD;  Location: [facility] OR;  Service: Orthopedics;  Laterality: Right;  . TONSILLECTOMY           Allergies  Allergen Reactions  . Codeine Nausea And Vomiting  . Penicillins Other (See Comments)  Unknown reaction - listed on MAR Has patient had a PCN reaction causing immediate rash, facial/tongue/throat swelling, SOB or lightheadedness with hypotension: Unknown Has patient had a PCN reaction causing severe rash involving mucus membranes or skin necrosis: Unknown Has patient had a PCN reaction that required hospitalization: Unknown Has patient had a PCN reaction occurring within the last 10 years: Unknown If all of the above answers are "NO", then may proceed with Cephalosporin use.      MEDICATIONS     Medication Sig  . acetaminophen (TYLENOL)  500 MG tablet Take 1 tablet (500 mg total) by mouth every 8 (eight) hours.  Marland Kitchen atorvastatin (LIPITOR) 40 MG tablet Take 1 tablet (40 mg total) by mouth daily.  Lucilla Lame [place]-Castor Oil (VENELEX) OINT Apply to sacrum and to bilateral buttocks qshift and prn  . carvedilol (COREG) 6.25 MG tablet Take 6.25 mg by mouth 2 (two) times daily with a meal.  . docusate sodium (COLACE) 100 MG capsule Take 200 mg by mouth at bedtime.  . donepezil (ARICEPT) 10 MG tablet TAKE ONE TABLET BY MOUTH ONCE DAILY.  . ergocalciferol (VITAMIN D2) 50000 UNITS capsule Take 50,000 Units by mouth every Tuesday.   . ferrous sulfate 325 (65 FE) MG EC tablet Take 325 mg by mouth 2 (two) times daily.  . furosemide (LASIX) 20 MG tablet Take 1 tablet (20 mg total) by mouth daily.  Marland Kitchen HYDROcodone-acetaminophen (NORCO/VICODIN) 5-325 MG tablet Take 1-2 tablets by mouth every 6 (six) hours as needed for moderate pain.  . hydrocortisone (ANUSOL-HC) 2.5 % rectal cream Place 1 application rectally 3 (three) times daily as needed for hemorrhoids or anal itching.  . levalbuterol (XOPENEX) 0.63 MG/3ML nebulizer solution Take 3 mLs (0.63 mg total) by nebulization every 8 (eight) hours as needed for wheezing or shortness of breath.  . meclizine (ANTIVERT) 25 MG tablet Take 25 mg by mouth 3 (three) times daily as needed for dizziness.  . Melatonin 3 MG TABS Take 1 tablet (3 mg total) by mouth at bedtime as needed.  . ondansetron (ZOFRAN) 4 MG tablet Take 4 mg by mouth every 8 (eight) hours as needed for nausea or vomiting.  . polyethylene glycol (MIRALAX / GLYCOLAX) packet Take 17 g by mouth daily.  .5. pregabalin (LYRICA) 75 MG capsule Take 1 capsule (75 mg total) by mouth every other day.  . traMADol (ULTRAM) 50 MG tablet Take 1 tablet (50 mg total) by mouth every 6 (six) hours as needed.  . umeclidinium-vilanterol (ANORO ELLIPTA) 62.5-25 MCG/INH AEPB Inhale 1 puff into the lungs daily.   .     .  ALPRAZolam (XANAX) 0.25 MG tablet Take 1  tablet (0.25 mg total) by mouth at bedtime as needed for anxiety.    Coumadin 2.5 mg daily Take 2.5 mg by mouth daily.   No facility-administered encounter medications on file as of 08/11/2018.    Review of systems.  In general she is not complaining of fever or chills.  Skin does not complain of rashes or itching.  Head ears eyes nose mouth and throat does not complain of visual changes has prescription lenses does not complain of dysphasia or sore throat.  Respiratory is not complaining of shortness of breath at this time or cough is on chronic oxygen.  Cardiac is not complaining of chest pain or edema.  GI does not complain of abdominal discomfort at this time nausea vomiting diarrhea constipation.  Musculoskeletal is complaining of some right knee pain- apparently this is been  more persistent the last 3 days or so- she says the pain at times extends into the upper right leg.  Neurologic is not complaining of dizziness headache or numbness.   psych does not complain of being overtly depressed-does have some occasional anxiety and does receive Xanax as needed at night    Physical exam.  She is afebrile pulse is 80 respirations of 19 blood pressure taken manually 144/54.  Weight is stable at 105 pounds.  In general this is a pleasant elderly female in no distress  Her skin is warm and dry.  Eyes visual acuity appears to be intact she has prescription lenses sclera and conjunctive are clear.  Oropharynx is clear mucous membranes moist.  Chest continues with shallow air entry but could not really appreciate any overt congestion there is no labored breathing-she does have oxygen applied via nasal cannula.  Heart is regular rate and rhythm with a systolic murmur at baseline- she has quite minimal lower extremity edema.  Abdomen is soft nontender with positive bowel sounds.  Musculoskeletal is able to move all extremities x4 continues to have some increased size of her  right thigh versus the left but this actually improved  from previous exam- she has some quite mild residual bruising at the thigh area- continues to have some edema of her right knee impaired to the left knee there is some mild tenderness to palpation of the right knee I do not see any erythema or significantly increased warmth-she does have flexion and extension at the knee without acute pain   Neurologic is grossly intact her speech is clear no lateralizing findings.   psych she is largely alert and oriented pleasant and appropriate   Labs.  August 19, 2018.  INR 1.34.  August 08, 2018.  WBC 8.6 hemoglobin 8.9 platelets 560.  August 03, 2018.  Sodium 142 potassium 3.7 BUN 20 creatinine 0.86.  Assessment plan.  1.  History of right knee discomfort- she says this has increased the last several days- will update x-rays of the knee as well as the femur area-again she does have a fairly extensive history of her right leg with the hip fracture and then the subsequent hematoma- she does not appear to be in any acute discomfort and this could be likely arthritic but will obtain an x-ray for further insight.  Continue current pain medications including Tylenol-tramadol and if needed hydrocodone.  .  #2-hypertension per review of blood pressures it appears this is fairly stable I got a systolic of 144/54 I see previous readings ranging from systolic in the 120s to occasionally 160s but no consistency here- she does have some history of hypotension so have been somewhat conservative here- she does continue on Coreg low-dose 6.25 mg twice daily   #3 history of diastolic CHF she is on low-dose Coreg as well as Lasix low-dose ---her weight appears to be stable edema is very scant today-respiratory status appears stable this is complicated with her history of COPD as well.  At this point will monitor will update a metabolic panel since she is on Lasix.  4.  History of aortic valve  replacement she continues on Coumadin- INR is pending for tomorrow adjustments were made secondary to INR of 1.34 on October 5   [id number]

## 2018-08-22 ENCOUNTER — Encounter: Payer: Self-pay | Admitting: Internal Medicine

## 2018-08-22 ENCOUNTER — Encounter (HOSPITAL_COMMUNITY)
Admission: RE | Admit: 2018-08-22 | Discharge: 2018-08-22 | Disposition: A | Discharge status: Home / Self Care | Payer: Medicare Other | Source: Skilled Nursing Facility | Attending: Internal Medicine | Admitting: Internal Medicine

## 2018-08-22 ENCOUNTER — Ambulatory Visit: Payer: Self-pay | Admitting: *Deleted

## 2018-08-22 DIAGNOSIS — N183 Chronic kidney disease, stage 3 (moderate): Secondary | ICD-10-CM | POA: Insufficient documentation

## 2018-08-22 DIAGNOSIS — S72001D Fracture of unspecified part of neck of right femur, subsequent encounter for closed fracture with routine healing: Secondary | ICD-10-CM | POA: Insufficient documentation

## 2018-08-22 DIAGNOSIS — Z7901 Long term (current) use of anticoagulants: Secondary | ICD-10-CM | POA: Insufficient documentation

## 2018-08-22 DIAGNOSIS — Z4789 Encounter for other orthopedic aftercare: Secondary | ICD-10-CM | POA: Insufficient documentation

## 2018-08-22 LAB — PROTIME-INR
INR: 2.1
PROTHROMBIN TIME: 23.4 s — AB (ref 11.4–15.2)

## 2018-08-23 ENCOUNTER — Other Ambulatory Visit: Payer: Self-pay

## 2018-08-23 NOTE — Patient Outreach (Signed)
[facility]) Care Management  08/23/2018  [name] 1926-07-14 [id number]   Received notification that [name] was pending discharge from [facility] on 08/22/18. RNCM placed outreach call to initiate transition of care services. Outreach unsuccessful. Left HIPAA compliant voice message requesting a return call.  Follow up call placed to [facility] to confirm member's discharge. Admissions POC was not available at the time of the call. Left voice message requesting a return call.   PLAN Will follow up with [facility] Care Coordinator. Will attempt to reach [name] in 3-4 business days.   Katha Cabal [facility] Community Care Manager [phone]

## 2018-08-24 ENCOUNTER — Encounter: Payer: Self-pay | Admitting: Internal Medicine

## 2018-08-24 ENCOUNTER — Non-Acute Institutional Stay (SKILLED_NURSING_FACILITY): Payer: Medicare Other | Admitting: Internal Medicine

## 2018-08-24 DIAGNOSIS — I5022 Chronic systolic (congestive) heart failure: Secondary | ICD-10-CM | POA: Diagnosis not present

## 2018-08-24 DIAGNOSIS — J418 Mixed simple and mucopurulent chronic bronchitis: Secondary | ICD-10-CM | POA: Diagnosis not present

## 2018-08-24 DIAGNOSIS — S7011XA Contusion of right thigh, initial encounter: Secondary | ICD-10-CM

## 2018-08-24 DIAGNOSIS — Z952 Presence of prosthetic heart valve: Secondary | ICD-10-CM

## 2018-08-24 DIAGNOSIS — Z96641 Presence of right artificial hip joint: Secondary | ICD-10-CM | POA: Diagnosis not present

## 2018-08-24 DIAGNOSIS — D62 Acute posthemorrhagic anemia: Secondary | ICD-10-CM

## 2018-08-24 NOTE — Progress Notes (Signed)
Location:    [facility] Room Number: 129/P Place of Service:  SNF [phone])  Provider: Edmon Crape PA-C  PCP: Kari Baars, MD Patient Care Team: Kari Baars, MD as PCP - General (Internal Medicine) Jens Som Madolyn Frieze, MD as PCP - Cardiology (Cardiology) Danella Maiers, [facility] as [facility] Care Management (Pharmacist) Arlyss Repress, LCSW as [facility] Care Management (Licensed Clinical Social Worker)  Extended Emergency Contact Information Primary Emergency Contact: Harland German Address: [address]          [place], [place] [address] [place] Home Phone: [phone] Work Phone: [phone] Mobile Phone: [phone] Relation: Other Secondary Emergency Contact: Greer Ee Mobile Phone: [phone] Relation: Granddaughter Preferred language: English Interpreter needed? No  Code Status: Full Code Goals of care:  Advanced Directive information Advanced Directives 08/11/2018  Does Patient Have a Medical Advance Directive? Yes  Type of Advance Directive (No Data)  Does patient want to make changes to medical advance directive? No - Patient declined  Copy of Healthcare Power of Attorney in Chart? -  Pre-existing out of facility DNR order (yellow form or pink MOST form) -     Allergies  Allergen Reactions  . Codeine Nausea And Vomiting  . Penicillins Other (See Comments)    Unknown reaction - listed on MAR Has patient had a PCN reaction causing immediate rash, facial/tongue/throat swelling, SOB or lightheadedness with hypotension: Unknown Has patient had a PCN reaction causing severe rash involving mucus membranes or skin necrosis: Unknown Has patient had a PCN reaction that required hospitalization: Unknown Has patient had a PCN reaction occurring within the last 10 years: Unknown If all of the above answers are "NO", then may proceed with Cephalosporin use.    Chief Complaint  Patient  presents with  . Discharge Note    Discharge Visit    HPI:  Patient is a pleasant 84 year old female seen today for discharge this coming Saturday.  She has obtained a period of stability after quite a lengthy hospitalization and rehab process.   Patient has a history of CVA as well as hypertension hyperlipidemia coronary artery disease status post CABG with aortic valve replacement on chronic Coumadin as well as combined congestive heart failure with ejection fraction of 50-55% and grade 1 diastolic dysfunction per echo she also has a history of COPD and continues on 4 L of oxygen chronically.    She was initially admitted here after a fall CT scan of the right hip originally was negative but repeat x-ray was done because she was having pain was positive for a right hip fracture and she underwent a right cannulation in early September.  She had postop anemia that required transfusion.  She returned to skilled nursing but was sent to the hospital again with significant anemia with a hemoglobin of 6.7 and INR was elevated at over 4   She also had right leg swelling- and was found to have a right thigh hematoma that was managed conservatively.  She also had acute diastolic CHF during her initial hospitalization her Lasix was increased she was started on hydralazine but did not tolerate it because of hypotension she is now off this-she does continue on Lasix low-dose-and her weights appear to be stable.  It was 106 today.  Earlier this week she complained of some increased right knee pain x-ray did not really show any acute process and the pain appears to be well-controlled today.  She has orders for Tylenol  does have tramadol as well as hydrocodone if needed for pain but she has minimized her narcotic intake.  The hematoma on her right thigh has gradually resolved there is still some residual edema but this is significantly improved.  Regards to COPD she continues on chronic oxygen  at 4 L this is been stable she continues on  Anoru Ellipta as well as Xopenex nebulizers as needed.  Last hemoglobin was 8.9 on the fourth this shows improvement will update this before discharge.  Also will update a metabolic panel and update INR is pending as well it was 2.1 earlier this week which is her goal range of 2-2.5     Currently she has no complaints she is sitting in a wheelchair comfortably is looking forward to going home-she does have a very supportive family- she will need continued PT and OT and home health support to follow her numerous medical issues    Recent Vital Signs   BP (!) 133/56   Pulse (!) 58   Temp (!) 97.4 F (36.3 C) (Oral)   Resp 20    Past Medical History:  Diagnosis Date  . Carotid artery stenosis    Without infarction  . Chronic combined systolic (congestive) and diastolic (congestive) heart failure (HCC)   . COPD (chronic obstructive pulmonary disease) (HCC)   . Coronary artery disease   . Heart valve replaced by other means   . Hypercholesterolemia    Pure  . Hypertension    Unspecified  . LBBB (left bundle branch block)   . Macular degeneration (senile) of retina, unspecified   . On home O2   . Postsurgical aortocoronary bypass status   . Stroke (HCC)   . Transient global amnesia   . Unspecified hereditary and idiopathic peripheral neuropathy   . Unspecified vitamin D deficiency            Diet Order    None                           Past Medical History:  Diagnosis Date  . Carotid artery stenosis    Without infarction  . Chronic combined systolic (congestive) and diastolic (congestive) heart failure (HCC)   . COPD (chronic obstructive pulmonary disease) (HCC)   . Coronary artery disease   . Heart valve replaced by other means   . Hypercholesterolemia    Pure  . Hypertension    Unspecified  . LBBB (left bundle branch block)   . Macular degeneration (senile) of retina, unspecified   . On home O2     . Postsurgical aortocoronary bypass status   . Stroke (HCC)   . Transient global amnesia   . Unspecified hereditary and idiopathic peripheral neuropathy   . Unspecified vitamin D deficiency     Past Surgical History:  Procedure Laterality Date  . AORTIC VALVE REPLACEMENT    . APPENDECTOMY    . CORONARY ARTERY BYPASS GRAFT    . HIP PINNING,CANNULATED Right 07/18/2018   Procedure: RIGHT CANNULATED HIP PINNING;  Surgeon: Myrene Galas, MD;  Location: [facility] OR;  Service: Orthopedics;  Laterality: Right;  . TONSILLECTOMY        reports that she quit smoking about 26 years ago. Her smoking use included cigarettes. She smoked 0.50 packs per day. She has never used smokeless tobacco. She reports that she does not drink alcohol or use drugs. Social History   Socioeconomic History  . Marital status: Widowed  Spouse name: Not on file  . Number of children: 2  . Years of education: 12th  . Highest education level: Not on file  Occupational History    Employer: RETIRED  Social Needs  . Financial resource strain: Not on file  . Food insecurity:    Worry: Not on file    Inability: Not on file  . Transportation needs:    Medical: Not on file    Non-medical: Not on file  Tobacco Use  . Smoking status: Former Smoker    Packs/day: 0.50    Types: Cigarettes    Last attempt to quit: 11/30/1991    Years since quitting: 26.7  . Smokeless tobacco: Never Used  . Tobacco comment: Tobacco use-no  Substance and Sexual Activity  . Alcohol use: No  . Drug use: No  . Sexual activity: Never  Lifestyle  . Physical activity:    Days per week: Not on file    Minutes per session: Not on file  . Stress: Not on file  Relationships  . Social connections:    Talks on phone: Not on file    Gets together: Not on file    Attends religious service: Not on file    Active member of club or organization: Not on file    Attends meetings of clubs or organizations: Not on file    Relationship status: Not on  file  . Intimate partner violence:    Fear of current or ex partner: Not on file    Emotionally abused: Not on file    Physically abused: Not on file    Forced sexual activity: Not on file  Other Topics Concern  . Not on file  Social History Narrative   Pt lives at home alone.   Caffeine Use: very rarely   Functional Status Survey:    Allergies  Allergen Reactions  . Codeine Nausea And Vomiting  . Penicillins Other (See Comments)    Unknown reaction - listed on MAR Has patient had a PCN reaction causing immediate rash, facial/tongue/throat swelling, SOB or lightheadedness with hypotension: Unknown Has patient had a PCN reaction causing severe rash involving mucus membranes or skin necrosis: Unknown Has patient had a PCN reaction that required hospitalization: Unknown Has patient had a PCN reaction occurring within the last 10 years: Unknown If all of the above answers are "NO", then may proceed with Cephalosporin use.    Pertinent  Health Maintenance Due  Topic Date Due  . DEXA SCAN  09/10/2018 (Originally 11/02/1993)  . INFLUENZA VACCINE  Completed  . PNA vac Low Risk Adult  Completed    Medications: Outpatient Encounter Medications as of 08/24/2018  Medication Sig  . acetaminophen (TYLENOL) 325 MG tablet Take 650 mg by mouth every 4 (four) hours as needed.  Marland Kitchen acetaminophen (TYLENOL) 500 MG tablet Take 1 tablet (500 mg total) by mouth every 8 (eight) hours.  . ALPRAZolam (XANAX) 0.25 MG tablet Take 0.25 mg by mouth at bedtime as needed for anxiety.  Marland Kitchen atorvastatin (LIPITOR) 40 MG tablet Take 1 tablet (40 mg total) by mouth daily.  Lucilla Lame [place]-Castor Oil (VENELEX) OINT Apply to sacrum and to bilateral buttocks qshift and prn  . carvedilol (COREG) 6.25 MG tablet Take 6.25 mg by mouth 2 (two) times daily with a meal.  . docusate sodium (COLACE) 100 MG capsule Take 200 mg by mouth at bedtime.  . donepezil (ARICEPT) 10 MG tablet TAKE ONE TABLET BY MOUTH ONCE DAILY.  .  ergocalciferol (  VITAMIN D2) 50000 UNITS capsule Take 50,000 Units by mouth every Tuesday.   . ferrous sulfate 325 (65 FE) MG EC tablet Take 325 mg by mouth 2 (two) times daily.  . furosemide (LASIX) 20 MG tablet Take 1 tablet (20 mg total) by mouth daily.  Marland Kitchen HYDROcodone-acetaminophen (NORCO/VICODIN) 5-325 MG tablet Take 1-2 tablets by mouth every 6 (six) hours as needed for moderate pain.  . hydrocortisone (ANUSOL-HC) 2.5 % rectal cream Place 1 application rectally 3 (three) times daily as needed for hemorrhoids or anal itching.  . levalbuterol (XOPENEX) 0.63 MG/3ML nebulizer solution Take 3 mLs (0.63 mg total) by nebulization every 8 (eight) hours as needed for wheezing or shortness of breath.  . meclizine (ANTIVERT) 25 MG tablet Take 25 mg by mouth 3 (three) times daily as needed for dizziness.  . ondansetron (ZOFRAN) 4 MG tablet Take 4 mg by mouth every 8 (eight) hours as needed for nausea or vomiting.  . polyethylene glycol (MIRALAX / GLYCOLAX) packet Take 17 g by mouth daily.  . pregabalin (LYRICA) 75 MG capsule Take 1 capsule (75 mg total) by mouth every other day.  . traMADol (ULTRAM) 50 MG tablet Take 1 tablet (50 mg total) by mouth every 6 (six) hours as needed.  . umeclidinium-vilanterol (ANORO ELLIPTA) 62.5-25 MCG/INH AEPB Inhale 1 puff into the lungs daily.   Marland Kitchen warfarin (COUMADIN) 2.5 MG tablet Take 2.5 mg by mouth every evening.  . [DISCONTINUED] Melatonin 3 MG TABS Take 1 tablet (3 mg total) by mouth at bedtime as needed.  . [DISCONTINUED] warfarin (COUMADIN) 2 MG tablet Take 2 mg by mouth daily.   No facility-administered encounter medications on file as of 08/24/2018.      Review of Systems   In general she is not complaining of any fever chills her weight appears to be stable.  Skin is not complain of rashes itching or diaphoresis.  Head ears eyes nose mouth and throat is not complaining of any sore throat or difficulty swallowing.  Respiratory he is on chronic oxygen but  does not complain of shortness of breath or cough today.  Cardiac is not complaining of chest pain her edema is very scant.  GI is not complaining of abdominal pain nausea vomiting diarrhea constipation.  GU is not complaining of dysuria.  Musculoskeletal does at times have diffuse arthritic joint pain appears controlled on the Tylenol again she does not use the tramadol or Norco very often it appears.  Neurologic is not complain of dizziness headache or numbness still has some lower extremity weakness but this has improved.  Psych does not complain of being anxious or depressed she does have PRN Xanax at night as needed has been on this long-term--She has been on this long term  Vitals:   08/24/18 1520  BP: (!) 133/56  Pulse: (!) 58  Resp: 20  Temp: (!) 97.4 F (36.3 C)  TempSrc: Oral   Of note manual blood pressure was 130/52- pulse was 66.  Weight was 106.2 pounds  Physical Exam   In general this is a pleasant elderly female in no distress sitting comfortably in her wheelchair.  Her skin is warm and dry.  Eyes visual acuity appears to be intact she has prescription lenses sclera and conjunctive are clear.  Oropharynx is clear mucous membranes moist.  Chest is clear to auscultation with shallow air entry there is no labored breathing she does have oxygen applied via nasal cannula.  Heart is largely regular rate and rhythm with  occasional irregular beats-  does have a baseline systolic murmur- she has scant lower extremity edema  Abdomen is soft nontender with positive bowel sounds.   musculoskeletal move all extremities x4 it appears at baseline with some continued lower extremity weakness still has some residual swelling from her right thigh hematoma but this appears to be improving fairly significantly.  She has arthritic changes of her knees bilaterally edema on her right knee  appears improved per her daughter.  Neurologic is grossly intact her speech is clear no  lateralizing findings.  Psych she is largely alert and oriented pleasant and appropriate her dementia is quite mild         Labs reviewed: Basic Metabolic Panel: Recent Labs    06/27/18 0018  07/20/18 0422 07/21/18 0441  07/24/18 0553 07/26/18 0459 08/03/18 0757  NA  --    < > 141 140   < > 140 136 142  K  --    < > 3.7 3.1*   < > 4.6 4.1 3.7  CL  --    < > 106 104   < > 105 102 105  CO2  --    < > 29 29   < > 28 28 31   GLUCOSE  --    < > 95 96   < > 103* 100* 155*  BUN  --    < > 18 20   < > 19 25* 20  CREATININE  --    < > 0.97 1.00   < > 0.99 1.09* 0.86  CALCIUM  --    < > 8.7* 8.6*  8.4*   < > 8.6* 8.1* 9.1  MG 1.9  --  1.9 1.9  --   --   --   --   PHOS  --   --   --  2.8  --   --   --   --    < > = values in this interval not displayed.   Liver Function Tests: Recent Labs    06/26/18 1935 07/02/18 1147  AST 36 26  ALT 24 19  ALKPHOS 73 70  BILITOT 0.7 0.6  PROT 6.5 6.4*  ALBUMIN 3.5 3.2*   Recent Labs    07/02/18 1147  LIPASE 59*   No results for input(s): AMMONIA in the last 8760 hours. CBC: Recent Labs    07/23/18 1051  08/03/18 0757 08/05/18 0730 08/06/18 0734 08/08/18 0705  WBC 12.0*   < > 11.3*  --  8.5 8.6  NEUTROABS 9.2*  --  9.3*  --  5.9  --   HGB 6.7*   < > 8.5* 7.6* 7.6* 8.9*  HCT 20.9*   < > 28.8* 24.9* 24.5* 29.5*  MCV 96.8   < > 101.4*  --  100.4* 101.7*  PLT 175   < > 727*  --  574* 560*   < > = values in this interval not displayed.   Cardiac Enzymes: Recent Labs    06/27/18 0614 06/27/18 1240 07/02/18 1147  TROPONINI 0.06* 0.05* 0.04*   BNP: Invalid input(s): POCBNP CBG: No results for input(s): GLUCAP in the last 8760 hours.  Procedures and Imaging Studies During Stay: Dg Hip Unilat With Pelvis 1v Right  Result Date: 08/01/2018 CLINICAL DATA:  Follow-up of fracture EXAM: DG HIP (WITH OR WITHOUT PELVIS) 1V RIGHT COMPARISON:  Pelvis and right hip films of 07/18/2017 FINDINGS: There is no change in pinning of the  right subcapital femoral fracture.  Deformity of the right pelvic ramus also is stable and appears old. Disruption of the pubic symphysis is unchanged. Mild degenerative change of the left hip is noted. The SI joints are corticated. There are degenerative changes in the lower lumbar spine. IMPRESSION: 1. Stable pinning of the right subcapital femoral fracture. 2. Stable old right pelvic rami fracture. Electronically Signed   By: Dwyane Dee M.D.   On: 08/01/2018 17:07    Assessment/Plan:    #1 history of right hip fracture with repair- complicated with a right leg hematoma and anemia- her aspirin has been discontinued this will warrant follow-up by cardiology at some point whether to reinstitute this.  She does continue on Coumadin again with a history of aortic valve replacement in the past.  Pain appears to be controlled largely with the Tylenol she does have tramadol on hydrocodone if needed.  .  2.  History of aortic valve replacement again she is on Coumadin INR is therapeutic at 2.1- goal INR is 2- 2.5.  Update INR is pending- current Coumadin dose of 2.5 mg she gets 5 mg once a week- again will update INR tomorrow.  .  3.  History of right thigh hematoma as noted above this appears to be slowly resolving-her hemoglobin has risen up to 8.9 this will warrant follow-up tomorrow as well.  4.  History anemia as noted above hemoglobin is rising up to 8.9 on lab done on September 24- she is on iron and will update this tomorrow.  5.  History of coronary artery disease status post CABG this is been stable again she is on anticoagulation- is on a low-dose beta-blocker as well Coreg.  6.  Hypertension again she is on low-dose Coreg blood pressure appears to be stable I got 130/52 manually today- previous blood pressures somewhat variable I see occasionally systolics in the 140s-160s but this is not persistent- again we have been trying to avoid hypotension with her history of dizziness and  falls  7.  History COPD she is on chronic oxygen this appears to be stable continues on  Anuro Ellipta as well as PRN Xopenex nebulizers.--- She will need continued bronchodilator therapy--and oxygen which she has been on chronically  8.-History of anxiety she continues on Xanax as needed at night she has been on months long-term and apparently tolerated well-.  She also is on melatonin for insomnia.  9.  History of dementia this appears to be quite mild she is on Aricept.  9.  History of diastolic CHF she continues on low-dose Lasix her weight ihasbeen stable she also is on a low-dose beta-blocker edema is quite scant  Again she will be going home she does have a very supportive family who will be with her-she will need continued PT and OT as well as home health support- pending INR results--she likely will need an updated INR next week to be drawn by home health and primary care provider notified of results.  [id number] note greater than 30 minutes spent on this discharge summary-greater than 50% of time spent coordinating a plan of care for numerous diagnoses

## 2018-08-25 ENCOUNTER — Other Ambulatory Visit: Payer: Self-pay

## 2018-08-25 ENCOUNTER — Encounter (HOSPITAL_COMMUNITY)
Admission: RE | Admit: 2018-08-25 | Discharge: 2018-08-25 | Disposition: A | Discharge status: Home / Self Care | Payer: Medicare Other | Source: Skilled Nursing Facility | Attending: Internal Medicine | Admitting: Internal Medicine

## 2018-08-25 DIAGNOSIS — N183 Chronic kidney disease, stage 3 (moderate): Secondary | ICD-10-CM | POA: Insufficient documentation

## 2018-08-25 DIAGNOSIS — S72001D Fracture of unspecified part of neck of right femur, subsequent encounter for closed fracture with routine healing: Secondary | ICD-10-CM | POA: Insufficient documentation

## 2018-08-25 DIAGNOSIS — Z4789 Encounter for other orthopedic aftercare: Secondary | ICD-10-CM | POA: Insufficient documentation

## 2018-08-25 LAB — BASIC METABOLIC PANEL
Anion gap: 6 (ref 5–15)
BUN: 18 mg/dL (ref 8–23)
CO2: 32 mmol/L (ref 22–32)
Calcium: 9.3 mg/dL (ref 8.9–10.3)
Chloride: 103 mmol/L (ref 98–111)
Creatinine, Ser: 0.75 mg/dL (ref 0.44–1.00)
GFR calc Af Amer: >60 mL/min (ref >60)
Glucose, Bld: 90 mg/dL (ref 70–99)
Potassium: 4 mmol/L (ref 3.5–5.1)
Sodium: 141 mmol/L (ref 135–145)

## 2018-08-25 LAB — PROTIME-INR
INR: 1.98
Prothrombin Time: 22.3 seconds — ABNORMAL HIGH (ref 11.4–15.2)

## 2018-08-25 LAB — CBC WITH DIFFERENTIAL/PLATELET
Abs Immature Granulocytes: 0.01 10*3/uL (ref 0.00–0.07)
BASOS PCT: 0 %
Basophils Absolute: 0 10*3/uL (ref 0.0–0.1)
Eosinophils Absolute: 0.1 10*3/uL (ref 0.0–0.5)
Eosinophils Relative: 2 %
HCT: 34.3 % — ABNORMAL LOW (ref 36.0–46.0)
Hemoglobin: 10.3 g/dL — ABNORMAL LOW (ref 12.0–15.0)
IMMATURE GRANULOCYTES: 0 %
Lymphocytes Relative: 22 %
Lymphs Abs: 1.2 10*3/uL (ref 0.7–4.0)
MCH: 31.2 pg (ref 26.0–34.0)
MCHC: 30 g/dL (ref 30.0–36.0)
MCV: 103.9 fL — ABNORMAL HIGH (ref 80.0–100.0)
MONOS PCT: 9 %
Monocytes Absolute: 0.5 10*3/uL (ref 0.1–1.0)
NEUTROS PCT: 67 %
Neutro Abs: 3.7 10*3/uL (ref 1.7–7.7)
PLATELETS: 193 10*3/uL (ref 150–400)
RBC: 3.3 MIL/uL — ABNORMAL LOW (ref 3.87–5.11)
RDW: 15.8 % — ABNORMAL HIGH (ref 11.5–15.5)
WBC: 5.6 10*3/uL (ref 4.0–10.5)
nRBC: 0 % (ref 0.0–0.2)

## 2018-08-25 NOTE — Patient Outreach (Signed)
[facility]) Care Management  08/25/2018  [name] 04-04-1926 [id number]   Call received from [name]'s daughter, Lisbeth Ply. She left a message stating that [name] is currently admitted at [facility] and expected to discharge over the weekend.   PLAN Will follow up on next week.   Katha Cabal [facility] Community Care Manager [phone]

## 2018-08-27 DIAGNOSIS — H3530 Unspecified macular degeneration: Secondary | ICD-10-CM | POA: Diagnosis not present

## 2018-08-27 DIAGNOSIS — F0390 Unspecified dementia without behavioral disturbance: Secondary | ICD-10-CM | POA: Diagnosis not present

## 2018-08-27 DIAGNOSIS — Z8673 Personal history of transient ischemic attack (TIA), and cerebral infarction without residual deficits: Secondary | ICD-10-CM | POA: Diagnosis not present

## 2018-08-27 DIAGNOSIS — Z79899 Other long term (current) drug therapy: Secondary | ICD-10-CM | POA: Diagnosis not present

## 2018-08-27 DIAGNOSIS — I110 Hypertensive heart disease with heart failure: Secondary | ICD-10-CM | POA: Diagnosis not present

## 2018-08-27 DIAGNOSIS — I5042 Chronic combined systolic (congestive) and diastolic (congestive) heart failure: Secondary | ICD-10-CM | POA: Diagnosis not present

## 2018-08-27 DIAGNOSIS — Z9981 Dependence on supplemental oxygen: Secondary | ICD-10-CM | POA: Diagnosis not present

## 2018-08-27 DIAGNOSIS — I2510 Atherosclerotic heart disease of native coronary artery without angina pectoris: Secondary | ICD-10-CM | POA: Diagnosis not present

## 2018-08-27 DIAGNOSIS — S72001D Fracture of unspecified part of neck of right femur, subsequent encounter for closed fracture with routine healing: Secondary | ICD-10-CM | POA: Diagnosis not present

## 2018-08-27 DIAGNOSIS — W19XXXD Unspecified fall, subsequent encounter: Secondary | ICD-10-CM | POA: Diagnosis not present

## 2018-08-27 DIAGNOSIS — Z87891 Personal history of nicotine dependence: Secondary | ICD-10-CM | POA: Diagnosis not present

## 2018-08-27 DIAGNOSIS — I447 Left bundle-branch block, unspecified: Secondary | ICD-10-CM | POA: Diagnosis not present

## 2018-08-27 DIAGNOSIS — Z952 Presence of prosthetic heart valve: Secondary | ICD-10-CM | POA: Diagnosis not present

## 2018-08-27 DIAGNOSIS — G609 Hereditary and idiopathic neuropathy, unspecified: Secondary | ICD-10-CM | POA: Diagnosis not present

## 2018-08-27 DIAGNOSIS — Z5181 Encounter for therapeutic drug level monitoring: Secondary | ICD-10-CM | POA: Diagnosis not present

## 2018-08-27 DIAGNOSIS — D649 Anemia, unspecified: Secondary | ICD-10-CM | POA: Diagnosis not present

## 2018-08-27 DIAGNOSIS — Z7901 Long term (current) use of anticoagulants: Secondary | ICD-10-CM | POA: Diagnosis not present

## 2018-08-27 DIAGNOSIS — Z951 Presence of aortocoronary bypass graft: Secondary | ICD-10-CM | POA: Diagnosis not present

## 2018-08-27 DIAGNOSIS — J449 Chronic obstructive pulmonary disease, unspecified: Secondary | ICD-10-CM | POA: Diagnosis not present

## 2018-08-27 DIAGNOSIS — Z9181 History of falling: Secondary | ICD-10-CM | POA: Diagnosis not present

## 2018-08-28 ENCOUNTER — Other Ambulatory Visit: Payer: Self-pay

## 2018-08-28 DIAGNOSIS — I110 Hypertensive heart disease with heart failure: Secondary | ICD-10-CM | POA: Diagnosis not present

## 2018-08-28 DIAGNOSIS — I5022 Chronic systolic (congestive) heart failure: Secondary | ICD-10-CM

## 2018-08-28 DIAGNOSIS — J418 Mixed simple and mucopurulent chronic bronchitis: Secondary | ICD-10-CM

## 2018-08-28 DIAGNOSIS — J449 Chronic obstructive pulmonary disease, unspecified: Secondary | ICD-10-CM | POA: Diagnosis not present

## 2018-08-28 DIAGNOSIS — S72001D Fracture of unspecified part of neck of right femur, subsequent encounter for closed fracture with routine healing: Secondary | ICD-10-CM | POA: Diagnosis not present

## 2018-08-28 DIAGNOSIS — I5042 Chronic combined systolic (congestive) and diastolic (congestive) heart failure: Secondary | ICD-10-CM | POA: Diagnosis not present

## 2018-08-28 DIAGNOSIS — I2510 Atherosclerotic heart disease of native coronary artery without angina pectoris: Secondary | ICD-10-CM | POA: Diagnosis not present

## 2018-08-28 DIAGNOSIS — G609 Hereditary and idiopathic neuropathy, unspecified: Secondary | ICD-10-CM | POA: Diagnosis not present

## 2018-08-28 DIAGNOSIS — S72001A Fracture of unspecified part of neck of right femur, initial encounter for closed fracture: Secondary | ICD-10-CM

## 2018-08-29 ENCOUNTER — Encounter (HOSPITAL_COMMUNITY)
Admission: RE | Admit: 2018-08-29 | Discharge: 2018-08-29 | Disposition: A | Discharge status: Home / Self Care | Payer: Medicare Other | Source: Skilled Nursing Facility | Attending: Internal Medicine | Admitting: Internal Medicine

## 2018-08-29 ENCOUNTER — Ambulatory Visit (INDEPENDENT_AMBULATORY_CARE_PROVIDER_SITE_OTHER): Payer: Medicare Other | Admitting: Pharmacist

## 2018-08-29 DIAGNOSIS — J449 Chronic obstructive pulmonary disease, unspecified: Secondary | ICD-10-CM | POA: Diagnosis not present

## 2018-08-29 DIAGNOSIS — G609 Hereditary and idiopathic neuropathy, unspecified: Secondary | ICD-10-CM | POA: Diagnosis not present

## 2018-08-29 DIAGNOSIS — Z952 Presence of prosthetic heart valve: Secondary | ICD-10-CM

## 2018-08-29 DIAGNOSIS — I359 Nonrheumatic aortic valve disorder, unspecified: Secondary | ICD-10-CM | POA: Diagnosis not present

## 2018-08-29 DIAGNOSIS — I5042 Chronic combined systolic (congestive) and diastolic (congestive) heart failure: Secondary | ICD-10-CM | POA: Diagnosis not present

## 2018-08-29 DIAGNOSIS — Z5181 Encounter for therapeutic drug level monitoring: Secondary | ICD-10-CM | POA: Diagnosis not present

## 2018-08-29 DIAGNOSIS — S72001D Fracture of unspecified part of neck of right femur, subsequent encounter for closed fracture with routine healing: Secondary | ICD-10-CM | POA: Diagnosis not present

## 2018-08-29 DIAGNOSIS — I2510 Atherosclerotic heart disease of native coronary artery without angina pectoris: Secondary | ICD-10-CM | POA: Diagnosis not present

## 2018-08-29 DIAGNOSIS — N183 Chronic kidney disease, stage 3 (moderate): Secondary | ICD-10-CM | POA: Insufficient documentation

## 2018-08-29 DIAGNOSIS — I110 Hypertensive heart disease with heart failure: Secondary | ICD-10-CM | POA: Diagnosis not present

## 2018-08-29 DIAGNOSIS — Z4789 Encounter for other orthopedic aftercare: Secondary | ICD-10-CM | POA: Insufficient documentation

## 2018-08-29 LAB — POCT INR: INR: 2.4 (ref 2.0–3.0)

## 2018-08-29 NOTE — Patient Instructions (Signed)
Description   Spoke with Maralyn Sago [facility] RN while in home with pt and advised pt to continue taking same dosage 1/2 a pill (2.5 mg) every day except 1 pill (5 mg) on Saturdays. Recheck in 2 weeks. [facility] [phone] Main [phone]

## 2018-08-29 NOTE — Patient Outreach (Signed)
[facility]) Care Management  08/28/2018  HEBA IGE February 10, 1926 [id number]    Outreach successful. HIPAA verifiers obtained. Ms. Dewan reported discharging from [facility] over the weekend and stated that she was feeling well today. Denied falls since discharge. She confirmed engagement with skilled nursing and therapy services from [facility].  Ms. Tremain reported medication compliance and reported that her caregiver [name] Montez Morita is available everyday. She is scheduled for labs and a PCP follow up later this week. Ms. Mariani was agreeable to further outreach and home visit on next week.    PLAN Will follow up next week for home visit.   Katha Cabal [facility] Community Care Manager [phone]

## 2018-08-30 DIAGNOSIS — G609 Hereditary and idiopathic neuropathy, unspecified: Secondary | ICD-10-CM | POA: Diagnosis not present

## 2018-08-30 DIAGNOSIS — J449 Chronic obstructive pulmonary disease, unspecified: Secondary | ICD-10-CM | POA: Diagnosis not present

## 2018-08-30 DIAGNOSIS — I110 Hypertensive heart disease with heart failure: Secondary | ICD-10-CM | POA: Diagnosis not present

## 2018-08-30 DIAGNOSIS — I2510 Atherosclerotic heart disease of native coronary artery without angina pectoris: Secondary | ICD-10-CM | POA: Diagnosis not present

## 2018-08-30 DIAGNOSIS — S72001D Fracture of unspecified part of neck of right femur, subsequent encounter for closed fracture with routine healing: Secondary | ICD-10-CM | POA: Diagnosis not present

## 2018-08-30 DIAGNOSIS — I5042 Chronic combined systolic (congestive) and diastolic (congestive) heart failure: Secondary | ICD-10-CM | POA: Diagnosis not present

## 2018-08-31 ENCOUNTER — Ambulatory Visit (INDEPENDENT_AMBULATORY_CARE_PROVIDER_SITE_OTHER): Payer: Medicare Other | Admitting: Otolaryngology

## 2018-08-31 DIAGNOSIS — H903 Sensorineural hearing loss, bilateral: Secondary | ICD-10-CM

## 2018-08-31 DIAGNOSIS — J449 Chronic obstructive pulmonary disease, unspecified: Secondary | ICD-10-CM | POA: Diagnosis not present

## 2018-08-31 DIAGNOSIS — Z952 Presence of prosthetic heart valve: Secondary | ICD-10-CM | POA: Diagnosis not present

## 2018-08-31 DIAGNOSIS — J9611 Chronic respiratory failure with hypoxia: Secondary | ICD-10-CM | POA: Diagnosis not present

## 2018-08-31 DIAGNOSIS — H838X3 Other specified diseases of inner ear, bilateral: Secondary | ICD-10-CM

## 2018-08-31 DIAGNOSIS — H6121 Impacted cerumen, right ear: Secondary | ICD-10-CM

## 2018-08-31 DIAGNOSIS — I5020 Unspecified systolic (congestive) heart failure: Secondary | ICD-10-CM | POA: Diagnosis not present

## 2018-09-01 DIAGNOSIS — S72001D Fracture of unspecified part of neck of right femur, subsequent encounter for closed fracture with routine healing: Secondary | ICD-10-CM | POA: Diagnosis not present

## 2018-09-01 DIAGNOSIS — G609 Hereditary and idiopathic neuropathy, unspecified: Secondary | ICD-10-CM | POA: Diagnosis not present

## 2018-09-01 DIAGNOSIS — J449 Chronic obstructive pulmonary disease, unspecified: Secondary | ICD-10-CM | POA: Diagnosis not present

## 2018-09-01 DIAGNOSIS — I5042 Chronic combined systolic (congestive) and diastolic (congestive) heart failure: Secondary | ICD-10-CM | POA: Diagnosis not present

## 2018-09-01 DIAGNOSIS — I110 Hypertensive heart disease with heart failure: Secondary | ICD-10-CM | POA: Diagnosis not present

## 2018-09-01 DIAGNOSIS — I2510 Atherosclerotic heart disease of native coronary artery without angina pectoris: Secondary | ICD-10-CM | POA: Diagnosis not present

## 2018-09-01 DIAGNOSIS — Z5181 Encounter for therapeutic drug level monitoring: Secondary | ICD-10-CM | POA: Diagnosis not present

## 2018-09-04 DIAGNOSIS — S72001D Fracture of unspecified part of neck of right femur, subsequent encounter for closed fracture with routine healing: Secondary | ICD-10-CM | POA: Diagnosis not present

## 2018-09-04 DIAGNOSIS — M25561 Pain in right knee: Secondary | ICD-10-CM | POA: Diagnosis not present

## 2018-09-05 ENCOUNTER — Other Ambulatory Visit: Payer: Self-pay | Admitting: Pharmacist

## 2018-09-05 ENCOUNTER — Other Ambulatory Visit: Payer: Self-pay

## 2018-09-05 ENCOUNTER — Ambulatory Visit: Payer: Self-pay | Admitting: Pharmacist

## 2018-09-05 DIAGNOSIS — J449 Chronic obstructive pulmonary disease, unspecified: Secondary | ICD-10-CM | POA: Diagnosis not present

## 2018-09-05 DIAGNOSIS — I110 Hypertensive heart disease with heart failure: Secondary | ICD-10-CM | POA: Diagnosis not present

## 2018-09-05 DIAGNOSIS — G609 Hereditary and idiopathic neuropathy, unspecified: Secondary | ICD-10-CM | POA: Diagnosis not present

## 2018-09-05 DIAGNOSIS — I2510 Atherosclerotic heart disease of native coronary artery without angina pectoris: Secondary | ICD-10-CM | POA: Diagnosis not present

## 2018-09-05 DIAGNOSIS — I5042 Chronic combined systolic (congestive) and diastolic (congestive) heart failure: Secondary | ICD-10-CM | POA: Diagnosis not present

## 2018-09-05 DIAGNOSIS — S72001D Fracture of unspecified part of neck of right femur, subsequent encounter for closed fracture with routine healing: Secondary | ICD-10-CM | POA: Diagnosis not present

## 2018-09-05 NOTE — Patient Outreach (Signed)
[facility]) Care Management   09/05/2018  [name] 1926/09/06 [id number]  [name] is an 84 y.o. female  Subjective:  Initial home visit complete. [name] alert and oriented x 3. No complaints of pain. [name] (live in caregiver) present at RNCM's arrival.  Objective:   BP 136/60 (BP Location: Right Arm, Patient Position: Sitting, Cuff Size: Normal)   Pulse 62   Resp 17   Ht 1.575 m (5\' 2" )   Wt 105 lb (47.6 kg)   SpO2 99%   BMI 19.20 kg/m   Review of Systems  Constitutional: Negative.   HENT: Positive for hearing loss. Negative for congestion, ear discharge, ear pain, nosebleeds, sinus pain, sore throat and tinnitus.   Eyes: Negative.   Respiratory: Positive for cough and sputum production. Negative for stridor.        Reported clear sputum.  Cardiovascular: Positive for leg swelling.       Minimum (non-pitting) edema noted to ankles.  Gastrointestinal: Negative.   Genitourinary: Positive for urgency.  Musculoskeletal: Negative.        Denied pain or tenderness to right hip.  Skin: Negative.        Peeling skin noted to hands.  Neurological: Negative.     Physical Exam  Constitutional: She is oriented to person, place, and time.  Cardiovascular: Normal rate.  Respiratory: Effort normal and breath sounds normal.  GI: Soft. Bowel sounds are normal.  Neurological: She is alert and oriented to person, place, and time.  Skin: Skin is warm and dry.  Psychiatric: She has a normal mood and affect. Her behavior is normal. Judgment and thought content normal.    Encounter Medications:   Outpatient Encounter Medications as of 09/05/2018  Medication Sig  . acetaminophen (TYLENOL) 325 MG tablet Take 650 mg by mouth every 4 (four) hours as needed.  Marland Kitchen acetaminophen (TYLENOL) 500 MG tablet Take 1 tablet (500 mg total) by mouth every 8 (eight) hours.  . ALPRAZolam (XANAX) 0.25 MG tablet Take 0.25 mg by mouth at bedtime as needed for anxiety.   Marland Kitchen atorvastatin (LIPITOR) 40 MG tablet Take 1 tablet (40 mg total) by mouth daily.  Lucilla Lame [place]-Cast[place] Oil (VENELEX) OINT Apply to sacrum and to bilateral buttocks qshift and prn  . carvedilol (COREG) 6.25 MG tablet Take 6.25 mg by mouth 2 (two) times daily with a meal.  . docusate sodium (COLACE) 100 MG capsule Take 200 mg by mouth at bedtime.  . donepezil (ARICEPT) 10 MG tablet TAKE ONE TABLET BY MOUTH ONCE DAILY.  . ergocalciferol (VITAMIN D2) 50000 UNITS capsule Take 50,000 Units by mouth every Tuesday.   . ferrous sulfate 325 (65 FE) MG EC tablet Take 325 mg by mouth 2 (two) times daily.  . furosemide (LASIX) 20 MG tablet Take 1 tablet (20 mg total) by mouth daily.  Marland Kitchen HYDROcodone-acetaminophen (NORCO/VICODIN) 5-325 MG tablet Take 1-2 tablets by mouth every 6 (six) hours as needed for moderate pain.  . hydrocortisone (ANUSOL-HC) 2.5 % rectal cream Place 1 application rectally 3 (three) times daily as needed for hemorrhoids or anal itching.  . levalbuterol (XOPENEX) 0.63 MG/3ML nebulizer solution Take 3 mLs (0.63 mg total) by nebulization every 8 (eight) hours as needed for wheezing or shortness of breath.  . meclizine (ANTIVERT) 25 MG tablet Take 25 mg by mouth 3 (three) times daily as needed for dizziness.  . ondansetron (ZOFRAN) 4 MG tablet Take 4 mg by mouth every 8 (eight) hours as needed  for nausea or vomiting.  . polyethylene glycol (MIRALAX / GLYCOLAX) packet Take 17 g by mouth daily.  . pregabalin (LYRICA) 75 MG capsule Take 1 capsule (75 mg total) by mouth every other day.  . traMADol (ULTRAM) 50 MG tablet Take 1 tablet (50 mg total) by mouth every 6 (six) hours as needed.  . umeclidinium-vilanterol (ANORO ELLIPTA) 62.5-25 MCG/INH AEPB Inhale 1 puff into the lungs daily.   Marland Kitchen warfarin (COUMADIN) 2.5 MG tablet Take 2.5 mg by mouth every evening.   No facility-administered encounter medications on file as of 09/05/2018.     Functional Status:   In your present state of health, do  you have any difficulty performing the following activities: 07/24/2018 07/16/2018  Hearing? Malvin Johns  Vision? N N  Difficulty concentrating or making decisions? N N  Walking or climbing stairs? Y N  Dressing or bathing? Y N  Doing errands, shopping? Y Y  Some recent data might be hidden    Assessment:    Initial home visit complete. [name] (daughter) and [name] (live in caregiver) were in the home at the time of the visit. [name] reported doing well, taking medications as prescribed and monitoring daily weight. Reported am weight of 105lbs. She denied complaints of shortness of breath, chest pain, increased edema or falls since discharge. Reported tolerating daily activities with continuous 02 at 4L/min. Receiving SN, PT and OT with [facility].  Discussed plan of care and available [facility] services. [name] reported several family members were available to assist and provide transportation if [name] (live in caregiver) was not available. She was not interested in [employer] or outreach activities. [name] reported that [name] has been able to obtain medications without difficulty but was agreeable to outreach from [facility] Pharmacist to determine if medications could be obtained at a lower cost. [name] denied urgent needs or concerns and was agreeable to ongoing outreach. She agreed to have her daughter or caregiver notify RNCM if needed prior to next scheduled outreach.    [facility] CM Care Plan Problem One     Most Recent Value  Care Plan Problem One  Risk for Readmission  Role Documenting the Problem One  Care Management Coordinator  Care Plan for Problem One  Active  [facility] Long Term Goal   Patient will not be rehospitalized over the next 60 days.  [facility] Long Term Goal Start Date  09/05/18  Interventions for Problem One Long Term Goal  Discussed medications, nutrition, safety precautions and chronic disease complications.  [facility] CM Short Term Goal #1   Over the next 30 days  patient will take medications as prescribed.  [facility] CM Short Term Goal #1 Start Date  09/05/18  Interventions for Short Term Goal #1  Reviewed medications and indications for use.  [Patient's daughter prepares medications.]  [facility] CM Short Term Goal #2   Over the next 30 days patient will attend all scheduled follow up appointment.  [facility] CM Short Term Goal #2 Start Date  09/05/18  Interventions for Short Term Goal #2  Assessed transportation needs. Discussed importance of attending all MD follow ups as scheduled.   [facility] CM Short Term Goal #3  Patient will weigh daily over the next 30 days.  [facility] CM Short Term Goal #3 Start Date  09/05/18  Interventions for Short Tern Goal #3  Reviewed COPD and CHF zones and action plan. Discussed importance of maintaining daily weight log and indications for notifying MD.    [facility]  CM Care Plan Problem Two     Most Recent Value  Care Plan Problem Two  High risk for falls.  Role Documenting the Problem Two  Care Management Coordinator  Care Plan for Problem Two  Active  Interventions for Problem Two Long Term Goal   Provided education regarding safety hazards, fall prevention and use of  assistive device.  [facility] Long Term Goal  Patient will not have fall related injuries over the next 60 days.  [facility] Long Term Goal Start Date  09/05/18  [facility] CM Short Term Goal #1   Over the next 30 days patient will use assistive device when ambulating in the home.  [facility] CM Short Term Goal #1 Start Date  09/05/18  Interventions for Short Term Goal #2   Provided education regarding fall prevention and importance of using assistive device. [Patient currently using a rollator walker.]  [facility] CM Short Term Goal #2   Over the next 30 days patient will allow time to adjust and rise slowly before ambulating.  [facility] CM Short Term Goal #2 Start Date  09/05/18  Interventions for Short Term Goal #2  Provided education regarding safety measure and the importance of rising slowly before ambulating. Discussed  possible trip hazards to include oxygen tubing.        PLAN Will follow up on next week.    Katha Cabal [facility] Community Care Manager [phone]

## 2018-09-05 NOTE — Patient Outreach (Addendum)
[facility]) Care Management  [place]   09/06/2018  TOBY AYAD [birth year] [id number]  Reason for referral: medication assistance  Referral source: [facility] RN CM Referral medication(s): Lyrica Current insurance: BCBS  PMHx: CAD, CHF, CVA, HTN, COPD, HLD, CKD III, dementia  HPI: 72 yoF recently hospitalized and discharged x2 to [facility] is now at home and in need of medication assistance for Lyrica.  Initial pharmacy referral was for medication reconciliation, however daughter states she is managing all medications with no issues.  They state their primary pharmacist at [facility] is doing a great job managing her medications.  Patient and daughter declined pharmacy home visit, however would like to explore financial assistance for Lyrica.  All other medications are affordable for them. Daughter returned call again to thank [place] for assistance.  She will let her mom know of new generic and cheaper cost next month.  Objective: Allergies  Allergen Reactions  . Codeine Nausea And Vomiting  . Penicillins Other (See Comments)    Unknown reaction - listed on MAR Has patient had a PCN reaction causing immediate rash, facial/tongue/throat swelling, SOB or lightheadedness with hypotension: Unknown Has patient had a PCN reaction causing severe rash involving mucus membranes or skin necrosis: Unknown Has patient had a PCN reaction that required hospitalization: Unknown Has patient had a PCN reaction occurring within the last 10 years: Unknown If all of the above answers are "NO", then may proceed with Cephalosporin use.    Medications Reviewed Today    Reviewed by Lavera Guise, [facility] (Pharmacist) on 09/06/18 at [place] List Status: <None>  Medication Order Taking? Sig Documenting Provider Last Dose Status Informant  acetaminophen (TYLENOL) 325 MG tablet [id number] Yes Take 650 mg by mouth every 4 (four) hours as needed. [provider]  Taking Active   acetaminophen (TYLENOL) 500 MG tablet [id number] Yes Take 1 tablet (500 mg total) by mouth every 8 (eight) hours. Ainsley Spinner, PA-C Taking Active Nursing Home Medication Administration Guide (MAG)  ALPRAZolam Duanne Moron) 0.25 MG tablet [id number] Yes Take 0.25 mg by mouth at bedtime as needed for anxiety. [provider] Taking Active   atorvastatin (LIPITOR) 40 MG tablet [id number] Yes Take 1 tablet (40 mg total) by mouth daily. Lelon Perla, MD Taking Active Nursing Home Medication Administration Guide ([place])  [address] [place]) [place] [id number] Yes Apply to sacrum and to bilateral buttocks qshift and prn [provider] Taking Active   carvedilol (COREG) 6.25 MG tablet [id number] Yes Take 6.25 mg by mouth 2 (two) times daily with a meal. [provider] Taking Active Nursing Home Medication Administration Guide (MAG)  docusate sodium (COLACE) 100 MG capsule [id number] Yes Take 200 mg by mouth at bedtime. [provider] Taking Active Nursing Home Medication Administration Guide (MAG)  donepezil (ARICEPT) 10 MG tablet [id number] Yes TAKE ONE TABLET BY MOUTH ONCE DAILY. Penni Bombard, MD Taking Active Nursing Home Medication Administration Guide (MAG)  ergocalciferol (VITAMIN D2) 50000 UNITS capsule [id number] Yes Take 50,000 Units by mouth every Tuesday.  [provider] Taking Active Nursing Home Medication Administration Guide (MAG)           Med Note Lolita Cram, SALLY C   Wed Aug 02, 2018  9:04 AM)    ferrous sulfate 325 (65 FE) MG EC tablet [id number] Yes Take 325 mg by mouth 2 (two) times daily. [provider] Taking Active Nursing Home Medication Administration Guide (MAG)  furosemide (LASIX) 20  MG tablet [id number] Yes Take 1 tablet (20 mg total) by mouth daily. Florencia Reasons, MD Taking Active Nursing Home Medication Administration Guide (MAG)           Med Note Lolita Cram, Janett Billow   Wed Aug 02, 2018  9:04 AM)     HYDROcodone-acetaminophen (NORCO/VICODIN) 5-325 MG tablet [id number] Yes Take 1-2 tablets by mouth every 6 (six) hours as needed for moderate pain. Wille Celeste, PA-C Taking Active   hydrocortisone (ANUSOL-HC) 2.5 % rectal cream [id number] Yes Place 1 application rectally 3 (three) times daily as needed for hemorrhoids or anal itching. [provider] Taking Active   levalbuterol Penne Lash) 0.63 MG/3ML nebulizer solution [id number] Yes Take 3 mLs (0.63 mg total) by nebulization every 8 (eight) hours as needed for wheezing or shortness of breath. Sinda Du, MD Taking Active Nursing Home Medication Administration Guide (MAG)  meclizine (ANTIVERT) 25 MG tablet [id number] Yes Take 25 mg by mouth 3 (three) times daily as needed for dizziness. [provider] Taking Active Nursing Home Medication Administration Guide (MAG)  ondansetron (ZOFRAN) 4 MG tablet [id number] Yes Take 4 mg by mouth every 8 (eight) hours as needed for nausea or vomiting. [provider] Taking Active Nursing Home Medication Administration Guide (MAG)  polyethylene glycol [facility] / GLYCOLAX) packet [id number] Yes Take 17 g by mouth daily. [provider] Taking Active Nursing Home Medication Administration Guide (MAG)  pregabalin (LYRICA) 75 MG capsule [id number] Yes Take 1 capsule (75 mg total) by mouth every other day. Wille Celeste, PA-C Taking Active   traMADol (ULTRAM) 50 MG tablet [id number] Yes Take 1 tablet (50 mg total) by mouth every 6 (six) hours as needed. Granville Lewis C, PA-C Taking Active   umeclidinium-vilanterol (ANORO ELLIPTA) 62.5-25 MCG/INH AEPB [id number] Yes Inhale 1 puff into the lungs daily.  [provider] Taking Active Nursing Home Medication Administration Guide (MAG)  warfarin (COUMADIN) 2.5 MG tablet [id number] Yes Take 2.5 mg by mouth every evening. [provider] Taking Active            Med Note Minerva Ends, [facility] N   Tue Sep 05, 2018 11:20 AM) Reported  taking 2.5 except on Saturday.  Med List Note Gwynne Edinger 07/16/18 1929): Resident of [facility] [phone]          Assessment:  Drugs sorted by system:  Neurologic/Psychologic: alprazolam, donepezil, pregabalin  Cardiovascular: atorvastatin, carvedilol, warfarin  Pulmonary/Allergy: levalbuterol, Anoro Ellipta  Gastrointestinal: docusate, meclizine, Miralax, ondansetron  Topical: Venelex Oint, Anusol Rectal Cream  Pain: APAP, hydrocodone/APAP, tramadol  Vitamins/Minerals/Supplements: vitD2, ferrous sulfate   Medication Review Findings:  . Meclizine is not preferred in geriatric patients: Per the Beers List, the antihistamine in this medication is highly anticholinergic, clearance may be reduced with advanced age, tolerance may develop when used as a hypnotic and should be avoided in the elderly. Antihistamine use in the elderly poses a greater risk of confusion, dry mouth, constipation, or other anticholinergic effects or toxicity . Alprazolam is not preferred in geriatric patients: This drug is identified in the Beers Criteria as a potentially inappropriate medication to be avoided in patients 65 years and older (independent of diagnosis or condition) due to increased risk of impaired cognition, delirium, falls, fractures, and motor vehicle accidents with benzodiazepine use. Per Beers list, this medication should be avoided in elderly patients with dementia or cognitive impairment because of adverse CNS effects.  o Patient is taking these medications PRN and sparingly due to side effects  and potential harm.  Patient counseled on side effects.  Medication Assistance Findings:  Extra Help:   []  Already receiving Full Extra Help  []  Already receiving Partial Extra Help  []  Eligible based on reported income and assets  [x]  Not Eligible based on reported income and assets  Patient Assistance Programs: 1) Lyrica made by [employer] o Income requirement met: []   Yes [x]  No []  Unknown   Additional medication assistance options reviewed with patient as warranted:  -Spoke with pharmacist at [facility] stated patient's copay should be her generic copay for next time  Plan: -I will alert patient/daughter of insurance/med assistance findings -I will close this case at this time.  No other pharmacy needs identified. -I will alert [place] members of discipline closure  Patient has been provided [facility] CM contact information if assistance needed in the future.    Thank you for allowing [facility] pharmacy to be involved in this patient's care.      Regina Eck, PharmD, [place]  [phone]

## 2018-09-06 ENCOUNTER — Telehealth: Payer: Self-pay | Admitting: Cardiology

## 2018-09-06 DIAGNOSIS — I2510 Atherosclerotic heart disease of native coronary artery without angina pectoris: Secondary | ICD-10-CM | POA: Diagnosis not present

## 2018-09-06 DIAGNOSIS — J449 Chronic obstructive pulmonary disease, unspecified: Secondary | ICD-10-CM | POA: Diagnosis not present

## 2018-09-06 DIAGNOSIS — I5042 Chronic combined systolic (congestive) and diastolic (congestive) heart failure: Secondary | ICD-10-CM | POA: Diagnosis not present

## 2018-09-06 DIAGNOSIS — I110 Hypertensive heart disease with heart failure: Secondary | ICD-10-CM | POA: Diagnosis not present

## 2018-09-06 DIAGNOSIS — S72001D Fracture of unspecified part of neck of right femur, subsequent encounter for closed fracture with routine healing: Secondary | ICD-10-CM | POA: Diagnosis not present

## 2018-09-06 DIAGNOSIS — G609 Hereditary and idiopathic neuropathy, unspecified: Secondary | ICD-10-CM | POA: Diagnosis not present

## 2018-09-06 NOTE — Telephone Encounter (Signed)
New Message          Pt c/o swelling: STAT is pt has developed SOB within 24 hours  1) How much weight have you gained and in what time span? 2 pds/ 1 day centimeter in ankle measurements  If swelling, where is the swelling located? Ankles 2) Are you currently taking a fluid pill? Yes   3) Are you currently SOB? Yes, lung have that crackling sound  4) Do you have a log of your daily weights (if so, list)?  Yes 105 increasing to 107  5) Have you gained 3 pounds in a day or 5 pounds in a week? No  6) Have you traveled recently? No              Nurse would like to know what suggestion Dr. Jens Som has?

## 2018-09-06 NOTE — Telephone Encounter (Signed)
Returned call to [employer] with [facility].She was calling to report patient has gained 2 lbs over night.Increase swelling in both ankles.Patient is alittle more sob.Advised increase Lasix to 40 mg daily for 3 days then return to normal dose 20 mg daily.Advised to call back if not better.

## 2018-09-07 DIAGNOSIS — J449 Chronic obstructive pulmonary disease, unspecified: Secondary | ICD-10-CM | POA: Diagnosis not present

## 2018-09-07 DIAGNOSIS — I2510 Atherosclerotic heart disease of native coronary artery without angina pectoris: Secondary | ICD-10-CM | POA: Diagnosis not present

## 2018-09-07 DIAGNOSIS — I5042 Chronic combined systolic (congestive) and diastolic (congestive) heart failure: Secondary | ICD-10-CM | POA: Diagnosis not present

## 2018-09-07 DIAGNOSIS — S72001D Fracture of unspecified part of neck of right femur, subsequent encounter for closed fracture with routine healing: Secondary | ICD-10-CM | POA: Diagnosis not present

## 2018-09-07 DIAGNOSIS — I110 Hypertensive heart disease with heart failure: Secondary | ICD-10-CM | POA: Diagnosis not present

## 2018-09-07 DIAGNOSIS — G609 Hereditary and idiopathic neuropathy, unspecified: Secondary | ICD-10-CM | POA: Diagnosis not present

## 2018-09-08 ENCOUNTER — Ambulatory Visit: Payer: Self-pay | Admitting: Pharmacist

## 2018-09-08 DIAGNOSIS — I2510 Atherosclerotic heart disease of native coronary artery without angina pectoris: Secondary | ICD-10-CM | POA: Diagnosis not present

## 2018-09-08 DIAGNOSIS — I110 Hypertensive heart disease with heart failure: Secondary | ICD-10-CM | POA: Diagnosis not present

## 2018-09-08 DIAGNOSIS — I5042 Chronic combined systolic (congestive) and diastolic (congestive) heart failure: Secondary | ICD-10-CM | POA: Diagnosis not present

## 2018-09-08 DIAGNOSIS — J449 Chronic obstructive pulmonary disease, unspecified: Secondary | ICD-10-CM | POA: Diagnosis not present

## 2018-09-08 DIAGNOSIS — G609 Hereditary and idiopathic neuropathy, unspecified: Secondary | ICD-10-CM | POA: Diagnosis not present

## 2018-09-08 DIAGNOSIS — S72001D Fracture of unspecified part of neck of right femur, subsequent encounter for closed fracture with routine healing: Secondary | ICD-10-CM | POA: Diagnosis not present

## 2018-09-11 ENCOUNTER — Telehealth: Payer: Self-pay | Admitting: Cardiology

## 2018-09-11 DIAGNOSIS — J449 Chronic obstructive pulmonary disease, unspecified: Secondary | ICD-10-CM | POA: Diagnosis not present

## 2018-09-11 DIAGNOSIS — I5042 Chronic combined systolic (congestive) and diastolic (congestive) heart failure: Secondary | ICD-10-CM | POA: Diagnosis not present

## 2018-09-11 DIAGNOSIS — I5022 Chronic systolic (congestive) heart failure: Secondary | ICD-10-CM

## 2018-09-11 DIAGNOSIS — S72001D Fracture of unspecified part of neck of right femur, subsequent encounter for closed fracture with routine healing: Secondary | ICD-10-CM | POA: Diagnosis not present

## 2018-09-11 DIAGNOSIS — G609 Hereditary and idiopathic neuropathy, unspecified: Secondary | ICD-10-CM | POA: Diagnosis not present

## 2018-09-11 DIAGNOSIS — I2510 Atherosclerotic heart disease of native coronary artery without angina pectoris: Secondary | ICD-10-CM | POA: Diagnosis not present

## 2018-09-11 DIAGNOSIS — I110 Hypertensive heart disease with heart failure: Secondary | ICD-10-CM | POA: Diagnosis not present

## 2018-09-11 NOTE — Telephone Encounter (Signed)
Spoke with pt dtr, Aware of dr crenshaw's recommendations.  

## 2018-09-11 NOTE — Telephone Encounter (Signed)
New message:      Pt c/o medication issue:  1. Name of Medication: Lasix  2. How are you currently taking this medication (dosage and times per day)? Take 1 tablet (20 mg total) by mouth daily.  3. Are you having a reaction (difficulty breathing--STAT)? No  4. What is your medication issue? Pt's daughter is calling and states they were told to double the lasix for 3 days due to fluid build up. Pt's daughter states she wants to know how much to give going forward. She also state the pt is having some pain under her left rib cage and it hurts for her to breath in/out and coughing. She states the pt got weak in the knees and her sister caught her in that area and they are not sure if she is just sore from that or is it fluid? Please advise.

## 2018-09-11 NOTE — Telephone Encounter (Signed)
On 10/23, patient was advised to increase lasix to 40mg  daily x3 days and then resume previous dose of 20mg  daily. She was 107lbs on 10/23. She went down to 103lbs but was hanging around 103-105lbs during those 3 days.  Per daughter, yesterday she weighed 105lbs in the AM and 108lbs in the afternoon - weighed b/c she was SOB. She had trouble breathing and had to take a breathing treatment and xanax to help her calm down.   This AM, patient was 106lbs and daughter decided to giver her 40mg  lasix today.  Weights @ home 10/13 - 10/22 - 105lbs 10/23           - 107lbs 10/24             - 105lbs 10/25             - 103lbs 10/26             - 103lbs 10/27             - 105lbs >> 108lbs 10/28             - 106lbs  Patient is hurting under left arm under breast into rib cage. She almost fell one day last week while stepping off scale and caregiver [name] caught her and grabbed this area to assist her. Daughter is not aware of any bruising. She is unsure if she is hurting b/c of fluid or the "fall"  No acute concerns reported by daughter today  Daughter would like to know what to do with lasix going forward.  Routed to MD to review/advise

## 2018-09-11 NOTE — Telephone Encounter (Signed)
Take lasix 20 mg daily and additional 40 mg daily for weight of 106 lbs or higher; check bmet one week. [name]

## 2018-09-12 ENCOUNTER — Telehealth: Payer: Self-pay | Admitting: Cardiology

## 2018-09-12 ENCOUNTER — Ambulatory Visit (INDEPENDENT_AMBULATORY_CARE_PROVIDER_SITE_OTHER): Payer: Medicare Other | Admitting: Cardiology

## 2018-09-12 ENCOUNTER — Telehealth: Payer: Self-pay | Admitting: *Deleted

## 2018-09-12 DIAGNOSIS — Z5181 Encounter for therapeutic drug level monitoring: Secondary | ICD-10-CM

## 2018-09-12 DIAGNOSIS — J449 Chronic obstructive pulmonary disease, unspecified: Secondary | ICD-10-CM | POA: Diagnosis not present

## 2018-09-12 DIAGNOSIS — Z952 Presence of prosthetic heart valve: Secondary | ICD-10-CM

## 2018-09-12 DIAGNOSIS — I5042 Chronic combined systolic (congestive) and diastolic (congestive) heart failure: Secondary | ICD-10-CM | POA: Diagnosis not present

## 2018-09-12 DIAGNOSIS — I359 Nonrheumatic aortic valve disorder, unspecified: Secondary | ICD-10-CM

## 2018-09-12 DIAGNOSIS — I2510 Atherosclerotic heart disease of native coronary artery without angina pectoris: Secondary | ICD-10-CM | POA: Diagnosis not present

## 2018-09-12 DIAGNOSIS — S72001D Fracture of unspecified part of neck of right femur, subsequent encounter for closed fracture with routine healing: Secondary | ICD-10-CM | POA: Diagnosis not present

## 2018-09-12 DIAGNOSIS — I110 Hypertensive heart disease with heart failure: Secondary | ICD-10-CM | POA: Diagnosis not present

## 2018-09-12 DIAGNOSIS — G609 Hereditary and idiopathic neuropathy, unspecified: Secondary | ICD-10-CM | POA: Diagnosis not present

## 2018-09-12 LAB — POCT INR: INR: 1.8 — AB (ref 2.0–3.0)

## 2018-09-12 NOTE — Telephone Encounter (Signed)
Gave orders to [place]. See Anticoagulation Encounter.

## 2018-09-12 NOTE — Telephone Encounter (Signed)
Telephoned daughter and made her aware that Dr Jens Som wants INR goal back to previous range. Therefore range is back to 2.2-3.0

## 2018-09-12 NOTE — Telephone Encounter (Signed)
Received call from [facility] with [facility] calling to report PT 21.8 INR 1.8.Message sent to coumadin clinic.Shanda Bumps wants to called back when to repeat.Call her at [phone].

## 2018-09-12 NOTE — Telephone Encounter (Signed)
-----   Message from Lewayne Bunting, MD sent at 09/12/2018  1:45 PM EDT ----- Regarding: RE: INR Range  Now that hematoma has improved would resume previous goal of 2-3. [name]  ----- Message ----- From: Raul Del, RN Sent: 09/12/2018   1:34 PM EDT To: Freddi Starr, RN, Lewayne Bunting, MD Subject: INR Range                                      Per daughter INR goal was changed in hospital to 2.0-2.5 due to large hematoma in pts leg. I did see on Dr Anne Fu note from 07/30/18 to aim for INR goal of 2.0-2.5 and it states the same at dischage on 08/01/18. Are we to keep the pts INR goal at 2.0-2.5 or resume previous range of 2.2-3.0? Please advise. Thank you.   Zilphia Kozinski

## 2018-09-13 ENCOUNTER — Other Ambulatory Visit: Payer: Self-pay

## 2018-09-13 NOTE — Patient Outreach (Signed)
[facility] Customer service manager [facility]) Care Management  09/13/2018  [name] Jan 15, 1926 [id number]    Unsuccessful outreach. Left HIPAA compliant voice message requesting a return call.    PLAN Will follow up in 3-4 business days.  Katha Cabal [facility] Community Care Manager [phone]

## 2018-09-14 DIAGNOSIS — J449 Chronic obstructive pulmonary disease, unspecified: Secondary | ICD-10-CM | POA: Diagnosis not present

## 2018-09-14 DIAGNOSIS — G609 Hereditary and idiopathic neuropathy, unspecified: Secondary | ICD-10-CM | POA: Diagnosis not present

## 2018-09-14 DIAGNOSIS — I2510 Atherosclerotic heart disease of native coronary artery without angina pectoris: Secondary | ICD-10-CM | POA: Diagnosis not present

## 2018-09-14 DIAGNOSIS — S72001D Fracture of unspecified part of neck of right femur, subsequent encounter for closed fracture with routine healing: Secondary | ICD-10-CM | POA: Diagnosis not present

## 2018-09-14 DIAGNOSIS — I5042 Chronic combined systolic (congestive) and diastolic (congestive) heart failure: Secondary | ICD-10-CM | POA: Diagnosis not present

## 2018-09-14 DIAGNOSIS — I110 Hypertensive heart disease with heart failure: Secondary | ICD-10-CM | POA: Diagnosis not present

## 2018-09-15 ENCOUNTER — Telehealth: Payer: Self-pay | Admitting: Cardiology

## 2018-09-15 ENCOUNTER — Other Ambulatory Visit: Payer: Self-pay

## 2018-09-15 DIAGNOSIS — I110 Hypertensive heart disease with heart failure: Secondary | ICD-10-CM | POA: Diagnosis not present

## 2018-09-15 DIAGNOSIS — J449 Chronic obstructive pulmonary disease, unspecified: Secondary | ICD-10-CM | POA: Diagnosis not present

## 2018-09-15 DIAGNOSIS — I2510 Atherosclerotic heart disease of native coronary artery without angina pectoris: Secondary | ICD-10-CM | POA: Diagnosis not present

## 2018-09-15 DIAGNOSIS — I5042 Chronic combined systolic (congestive) and diastolic (congestive) heart failure: Secondary | ICD-10-CM | POA: Diagnosis not present

## 2018-09-15 DIAGNOSIS — S72001D Fracture of unspecified part of neck of right femur, subsequent encounter for closed fracture with routine healing: Secondary | ICD-10-CM | POA: Diagnosis not present

## 2018-09-15 DIAGNOSIS — G609 Hereditary and idiopathic neuropathy, unspecified: Secondary | ICD-10-CM | POA: Diagnosis not present

## 2018-09-15 NOTE — Telephone Encounter (Signed)
Spoke with [name] [facility] nurse) and patient weighs 109lbs today. She is alerting Dr. Jens Som as med changes were recently made.  [facility] called while [name] is at home, and they plan to send a nurse to assess patient   Per caregiver [name] - patient has difficulty getting up in the AM and has swelling. She was 105lbs yesterday and 109lbs today.   Per [name] & [name], instructions were given for lasix 20mg  QD + additional 20mg  (for a total of 40mg  QD) if needed for weight >106lbs. Advised that in reviewing MD instructions, this appears different than what was relayed (see below). Advised to f/up instructions copied below and patient will come in on Monday 11/4 for APP visit @ 1130 with Harriet Pho NP for assessment since weight fluctuations & HF symptoms have been an issue (offered appt for APP today but patient/caregiver declined).   Lewayne Bunting, MD  to Freddi Starr, RN      09/11/18 10:15 AM  Note    Take lasix 20 mg daily and additional 40 mg daily for weight of 106 lbs or higher; check bmet one week. [name]

## 2018-09-15 NOTE — Telephone Encounter (Signed)
Daughter called back and states that the caregiver was told to give pt and additional 40mg . She did not think this was right so she called back Informed daughter that it is ok to take additional 20mg  if weight has increased and only 20mg  then. She states that pts weight is back to 106. She gave her additional Monday and yesterday only she states that she cannot bring her in Monday she has her own dr's appt. So I will cancel that appt. She will have the homecare nurse call us with where they want the order sent.

## 2018-09-15 NOTE — Telephone Encounter (Signed)
New Message:     [name] is calling to say have had a 4 lbs weight gain overnight. Dr Jens Som instructed to take 40 mg of Lasix if pt weight was 106. This morning pt's weight is 109. Her vitals are all norman and nos acute distress. She is calling  To alert Dr Jens Som. She also instructed pt that is weight continues to go up to please go to the ER.

## 2018-09-15 NOTE — Patient Outreach (Signed)
[facility] Customer service manager [facility]) Care Management  09/15/2018  [name] 11/11/1926 [id number]   Successful outreach with [name]. She reported a weight greater than 106 lbs and stated that additional Lasix was administered. She reported congestion and feeling short of breath after waking this morning but stated the symptoms quickly resolved after taking her meds. Reported taking medications as prescribed and denied falls. She requested that RNCM speak with the Home Health therapist. Per therapist Georgiann Hahn, [name] was in no acute distress but had a notable weight gain over the weekend. Reported that she tolerated her therapy session well. [name] verbalized knowledge of emergent signs/symptoms that require immediate medical attention and confirmed that caregiver [name] was in the home. [name] was agreeable to home visit from [facility] Paramedic later today.   PLAN Will confirm appointment time with Paramedic and contact MD office. Will follow up with [name] on next week.   Katha Cabal [facility] Community Care Manager [phone]

## 2018-09-15 NOTE — Telephone Encounter (Signed)
Called patient daughter. She states that she is confused on how she should take her lasix medication. I advised of the note from Dr.Crenshaw: Colleen Bunting, MD  to Colleen Starr, RN    09/11/18 10:15 AM  Note    Take lasix 20 mg daily and additional 40 mg daily for weight of 106 lbs or higher; check bmet one week. [name]     Advised daughter of what I read from Provider. She states they only have her an extra 20 mg this morning, because she feels that 40 is too much. I advised if she wanted to see if the extra 20 mg helped that it was fine, but to monitor her weight and if we continue to have issues we could increase to the 40 mg extra. Daughter would like for me to call Home nurse [name] to give order for the BMET to be drawn next week with the INR check.   [name], gave verbal order for BMET, she verbalized understanding. No questions at this time.

## 2018-09-15 NOTE — Telephone Encounter (Signed)
New message   Pt c/o swelling: STAT is pt has developed SOB within 24 hours  1) How much weight have you gained and in what time span? 4 lbs overnight   2) If swelling, where is the swelling located? Ankles   3) Are you currently taking a fluid pill? yes  4) Are you currently SOB? No   5) Do you have a log of your daily weights (if so, list)?patient has log   6) Have you gained 3 pounds in a day or 5 pounds in a week? Yes   7) Have you traveled recently? No

## 2018-09-16 ENCOUNTER — Other Ambulatory Visit: Payer: Self-pay | Admitting: Cardiology

## 2018-09-17 NOTE — Progress Notes (Deleted)
Cardiology Office Note   Date:  09/17/2018   ID:  [name], DOB January 09, 1926, MRN [id number]  PCP:  Kari Baars, MD  Cardiologist:  Winfield Rast  No chief complaint on file.    History of Present Illness: [name] is a 84 y.o. female who presents for ongoing assessment and management of CAD, with hx of CABG, with most recent cardiac cath in 2011 revealing all grafts occluded. Medical therapy was recommended. She has St Jude AoV, on coumadin,  with trace AoV insufficiency, moderate to severe left atrial enlargement and mild to moderate pulmonary hypertension. EF was 35%.  Mild carotid artery disease.   Past Medical History:  Diagnosis Date  . Carotid artery stenosis    Without infarction  . Chronic combined systolic (congestive) and diastolic (congestive) heart failure (HCC)   . COPD (chronic obstructive pulmonary disease) (HCC)   . Coronary artery disease   . Heart valve replaced by other means   . Hypercholesterolemia    Pure  . Hypertension    Unspecified  . LBBB (left bundle branch block)   . Macular degeneration (senile) of retina, unspecified   . On home O2   . Postsurgical aortocoronary bypass status   . Stroke (HCC)   . Transient global amnesia   . Unspecified hereditary and idiopathic peripheral neuropathy   . Unspecified vitamin D deficiency     Past Surgical History:  Procedure Laterality Date  . AORTIC VALVE REPLACEMENT    . APPENDECTOMY    . CORONARY ARTERY BYPASS GRAFT    . HIP PINNING,CANNULATED Right 07/18/2018   Procedure: RIGHT CANNULATED HIP PINNING;  Surgeon: Myrene Galas, MD;  Location: [facility] OR;  Service: Orthopedics;  Laterality: Right;  . TONSILLECTOMY       Current Outpatient Medications  Medication Sig Dispense Refill  . acetaminophen (TYLENOL) 325 MG tablet Take 650 mg by mouth every 4 (four) hours as needed.    Marland Kitchen acetaminophen (TYLENOL) 500 MG tablet Take 1 tablet (500 mg total) by mouth every 8 (eight) hours. 30 tablet 0  .  ALPRAZolam (XANAX) 0.25 MG tablet Take 0.25 mg by mouth at bedtime as needed for anxiety.    Marland Kitchen atorvastatin (LIPITOR) 40 MG tablet Take 1 tablet (40 mg total) by mouth daily. 90 tablet 3  . Balsam [place]-Castor Oil (VENELEX) OINT Apply to sacrum and to bilateral buttocks qshift and prn    . carvedilol (COREG) 6.25 MG tablet Take 6.25 mg by mouth 2 (two) times daily with a meal.    . docusate sodium (COLACE) 100 MG capsule Take 200 mg by mouth at bedtime.    . donepezil (ARICEPT) 10 MG tablet TAKE ONE TABLET BY MOUTH ONCE DAILY. 30 tablet 0  . ergocalciferol (VITAMIN D2) 50000 UNITS capsule Take 50,000 Units by mouth every Tuesday.     . ferrous sulfate 325 (65 FE) MG EC tablet Take 325 mg by mouth 2 (two) times daily.    . furosemide (LASIX) 20 MG tablet Take 1 tablet (20 mg total) by mouth daily. 30 tablet 0  . HYDROcodone-acetaminophen (NORCO/VICODIN) 5-325 MG tablet Take 1-2 tablets by mouth every 6 (six) hours as needed for moderate pain. 60 tablet 0  . hydrocortisone (ANUSOL-HC) 2.5 % rectal cream Place 1 application rectally 3 (three) times daily as needed for hemorrhoids or anal itching.    . levalbuterol (XOPENEX) 0.63 MG/3ML nebulizer solution Take 3 mLs (0.63 mg total) by nebulization every 8 (eight) hours as needed for wheezing or shortness  of breath. 3 mL 12  . meclizine (ANTIVERT) 25 MG tablet Take 25 mg by mouth 3 (three) times daily as needed for dizziness.    . ondansetron (ZOFRAN) 4 MG tablet Take 4 mg by mouth every 8 (eight) hours as needed for nausea or vomiting.    . polyethylene glycol (MIRALAX / GLYCOLAX) packet Take 17 g by mouth daily.    . pregabalin (LYRICA) 75 MG capsule Take 1 capsule (75 mg total) by mouth every other day. 15 capsule 0  . traMADol (ULTRAM) 50 MG tablet Take 1 tablet (50 mg total) by mouth every 6 (six) hours as needed. 60 tablet 0  . umeclidinium-vilanterol (ANORO ELLIPTA) 62.5-25 MCG/INH AEPB Inhale 1 puff into the lungs daily.     Marland Kitchen warfarin (COUMADIN)  2.5 MG tablet Take 2.5 mg by mouth every evening.     No current facility-administered medications for this visit.     Allergies:   Codeine and Penicillins    Social History:  The patient  reports that she quit smoking about 26 years ago. Her smoking use included cigarettes. She smoked 0.50 packs per day. She has never used smokeless tobacco. She reports that she does not drink alcohol or use drugs.   Family History:  The patient's family history includes COPD in her sister; Heart attack in her father; Neuropathy in her brother; Stroke in her sister.    ROS: All other systems are reviewed and negative. Unless otherwise mentioned in H&P    PHYSICAL EXAM: VS:  There were no vitals taken for this visit. , BMI There is no height or weight on file to calculate BMI. GEN: Well nourished, well developed, in no acute distress HEENT: normal Neck: no JVD, carotid bruits, or masses Cardiac: ***RRR; no murmurs, rubs, or gallops,no edema  Respiratory:  Clear to auscultation bilaterally, normal work of breathing GI: soft, nontender, nondistended, + BS MS: no deformity or atrophy Skin: warm and dry, no rash Neuro:  Strength and sensation are intact Psych: euthymic mood, full affect   EKG:  EKG {ACTION; IS/IS ZOX:[id number]} ordered today. The ekg ordered today demonstrates ***   Recent Labs: 06/26/2018: B Natriuretic Peptide 1,329.0 07/02/2018: ALT 19 07/21/2018: Magnesium 1.9; TSH 1.942 08/25/2018: BUN 18; Creatinine, Ser 0.75; Hemoglobin 10.3; Platelets 193; Potassium 4.0; Sodium 141    Lipid Panel    Component Value Date/Time   CHOL 151 07/03/2018 0700   TRIG 57 07/03/2018 0700   HDL 56 07/03/2018 0700   CHOLHDL 2.7 07/03/2018 0700   VLDL 11 07/03/2018 0700   LDLCALC 84 07/03/2018 0700      Wt Readings from Last 3 Encounters:  09/05/18 105 lb (47.6 kg)  08/08/18 106 lb (48.1 kg)  07/25/18 115 lb 15.4 oz (52.6 kg)      Other studies Reviewed: Additional studies/ records that  were reviewed today include: ***. Review of the above records demonstrates: ***   ASSESSMENT AND PLAN:  1.  ***   Current medicines are reviewed at length with the patient today.    Labs/ tests ordered today include: *** Bettey Mare. Liborio Nixon, ANP, AACC   09/17/2018 5:07 PM    [facility] [address] Office [phone] Fax [phone]

## 2018-09-18 ENCOUNTER — Ambulatory Visit: Payer: Medicare Other | Admitting: Adult Health

## 2018-09-18 DIAGNOSIS — I5042 Chronic combined systolic (congestive) and diastolic (congestive) heart failure: Secondary | ICD-10-CM | POA: Diagnosis not present

## 2018-09-18 DIAGNOSIS — I2510 Atherosclerotic heart disease of native coronary artery without angina pectoris: Secondary | ICD-10-CM | POA: Diagnosis not present

## 2018-09-18 DIAGNOSIS — J449 Chronic obstructive pulmonary disease, unspecified: Secondary | ICD-10-CM | POA: Diagnosis not present

## 2018-09-18 DIAGNOSIS — S72001D Fracture of unspecified part of neck of right femur, subsequent encounter for closed fracture with routine healing: Secondary | ICD-10-CM | POA: Diagnosis not present

## 2018-09-18 DIAGNOSIS — G609 Hereditary and idiopathic neuropathy, unspecified: Secondary | ICD-10-CM | POA: Diagnosis not present

## 2018-09-18 DIAGNOSIS — I110 Hypertensive heart disease with heart failure: Secondary | ICD-10-CM | POA: Diagnosis not present

## 2018-09-19 ENCOUNTER — Ambulatory Visit (INDEPENDENT_AMBULATORY_CARE_PROVIDER_SITE_OTHER): Payer: Medicare Other | Admitting: Cardiology

## 2018-09-19 ENCOUNTER — Encounter: Payer: Self-pay | Admitting: *Deleted

## 2018-09-19 DIAGNOSIS — I110 Hypertensive heart disease with heart failure: Secondary | ICD-10-CM | POA: Diagnosis not present

## 2018-09-19 DIAGNOSIS — Z952 Presence of prosthetic heart valve: Secondary | ICD-10-CM

## 2018-09-19 DIAGNOSIS — G609 Hereditary and idiopathic neuropathy, unspecified: Secondary | ICD-10-CM | POA: Diagnosis not present

## 2018-09-19 DIAGNOSIS — I2510 Atherosclerotic heart disease of native coronary artery without angina pectoris: Secondary | ICD-10-CM | POA: Diagnosis not present

## 2018-09-19 DIAGNOSIS — I359 Nonrheumatic aortic valve disorder, unspecified: Secondary | ICD-10-CM

## 2018-09-19 DIAGNOSIS — Z5181 Encounter for therapeutic drug level monitoring: Secondary | ICD-10-CM | POA: Diagnosis not present

## 2018-09-19 DIAGNOSIS — I5042 Chronic combined systolic (congestive) and diastolic (congestive) heart failure: Secondary | ICD-10-CM | POA: Diagnosis not present

## 2018-09-19 DIAGNOSIS — J449 Chronic obstructive pulmonary disease, unspecified: Secondary | ICD-10-CM | POA: Diagnosis not present

## 2018-09-19 DIAGNOSIS — S72001D Fracture of unspecified part of neck of right femur, subsequent encounter for closed fracture with routine healing: Secondary | ICD-10-CM | POA: Diagnosis not present

## 2018-09-19 LAB — POCT INR: INR: 2.4 (ref 2.0–3.0)

## 2018-09-20 ENCOUNTER — Other Ambulatory Visit: Payer: Self-pay

## 2018-09-20 ENCOUNTER — Ambulatory Visit: Payer: Self-pay

## 2018-09-20 DIAGNOSIS — I110 Hypertensive heart disease with heart failure: Secondary | ICD-10-CM | POA: Diagnosis not present

## 2018-09-20 DIAGNOSIS — I5042 Chronic combined systolic (congestive) and diastolic (congestive) heart failure: Secondary | ICD-10-CM | POA: Diagnosis not present

## 2018-09-20 DIAGNOSIS — J449 Chronic obstructive pulmonary disease, unspecified: Secondary | ICD-10-CM | POA: Diagnosis not present

## 2018-09-20 DIAGNOSIS — S72001D Fracture of unspecified part of neck of right femur, subsequent encounter for closed fracture with routine healing: Secondary | ICD-10-CM | POA: Diagnosis not present

## 2018-09-20 DIAGNOSIS — G609 Hereditary and idiopathic neuropathy, unspecified: Secondary | ICD-10-CM | POA: Diagnosis not present

## 2018-09-20 DIAGNOSIS — I2510 Atherosclerotic heart disease of native coronary artery without angina pectoris: Secondary | ICD-10-CM | POA: Diagnosis not present

## 2018-09-20 NOTE — Patient Outreach (Signed)
[facility] Customer service manager [facility]) Care Management  09/20/2018  MISTI TOWLE [birth year] [id number]   Follow up outreach with [name]. She reported a morning weight of 105lbs. Reported that additional Lasix dose has not been required since last outreach. Reported taking medications as prescribed and denied falls. Pending appointment with Home Health Physical Therapist later today. Denied urgent concerns. Member and caregiver [name] encouraged to notify RNCM if needed prior to scheduled home visit.   PLAN Will follow up for home visit on next week.   Katha Cabal [facility] Community Care Manager [phone]

## 2018-09-21 NOTE — Progress Notes (Signed)
HPI: FU coronary artery disease (s/p CABG), prior aortic valve replacement and aortic insufficiency. Last cardiac catheterization in 2011 revealed normal left main; 30% first diagonal, 50% LAD and 60% second diagonal. There was a 40-50% circumflex and a 40% right coronary artery. All grafts were occluded. Medical therapy recommended. Transesophageal echocardiogram in February of 2012 showed an ejection fraction of 45%. There was a St. Jude aortic valve with moderate perivalvular aortic insufficiency. There was moderate mitral regurgitation, moderate left atrial enlargement, mild right atrial enlargement and mild tricuspid regurgitation.Carotid Dopplers August 2019 showed 1 to 39% bilateral stenosis. Last echo 3/19 interpreted as ejection fraction 50 to 55%, trace aortic insufficiency with bioprosthetic aortic valve, moderate to severe left atrial enlargement and mild to moderate pulmonary hypertension.  I personally reviewed this echocardiogram and thought ejection fraction was 35%. Since she was last seen,she has some dyspnea on exertion.  No orthopnea or PND.  Mild pedal edema.  No chest pain or syncope.  Current Outpatient Medications  Medication Sig Dispense Refill  . acetaminophen (TYLENOL) 325 MG tablet Take 650 mg by mouth every 4 (four) hours as needed.    Marland Kitchen acetaminophen (TYLENOL) 500 MG tablet Take 1 tablet (500 mg total) by mouth every 8 (eight) hours. 30 tablet 0  . ALPRAZolam (XANAX) 0.25 MG tablet Take 0.25 mg by mouth at bedtime as needed for anxiety.    Marland Kitchen atorvastatin (LIPITOR) 40 MG tablet Take 1 tablet (40 mg total) by mouth daily. 90 tablet 3  . carvedilol (COREG) 6.25 MG tablet Take 6.25 mg by mouth 2 (two) times daily with a meal.    . docusate sodium (COLACE) 100 MG capsule Take 200 mg by mouth at bedtime.    . donepezil (ARICEPT) 10 MG tablet TAKE ONE TABLET BY MOUTH ONCE DAILY. 30 tablet 0  . ergocalciferol (VITAMIN D2) 50000 UNITS capsule Take 50,000 Units by mouth  every Tuesday.     . ferrous sulfate 325 (65 FE) MG EC tablet Take 325 mg by mouth 2 (two) times daily.    . furosemide (LASIX) 20 MG tablet Take 1 tablet (20 mg total) by mouth daily. 30 tablet 0  . HYDROcodone-acetaminophen (NORCO/VICODIN) 5-325 MG tablet Take 1-2 tablets by mouth every 6 (six) hours as needed for moderate pain. 60 tablet 0  . hydrocortisone (ANUSOL-HC) 2.5 % rectal cream Place 1 application rectally 3 (three) times daily as needed for hemorrhoids or anal itching.    . levalbuterol (XOPENEX) 0.63 MG/3ML nebulizer solution Take 0.63 mg by nebulization every evening.    . meclizine (ANTIVERT) 25 MG tablet Take 25 mg by mouth 3 (three) times daily as needed for dizziness.    . Melatonin 3 MG TABS Take 1 tablet by mouth at bedtime.    . multivitamin-lutein (OCUVITE-LUTEIN) CAPS capsule Take 1 capsule by mouth daily.    . ondansetron (ZOFRAN) 4 MG tablet Take 4 mg by mouth every 8 (eight) hours as needed for nausea or vomiting.    . polyethylene glycol (MIRALAX / GLYCOLAX) packet Take 17 g by mouth daily as needed.     . pregabalin (LYRICA) 75 MG capsule Take 1 capsule (75 mg total) by mouth every other day. 15 capsule 0  . traMADol (ULTRAM) 50 MG tablet Take 1 tablet (50 mg total) by mouth every 6 (six) hours as needed. 60 tablet 0  . umeclidinium-vilanterol (ANORO ELLIPTA) 62.5-25 MCG/INH AEPB Inhale 1 puff into the lungs daily.     Marland Kitchen warfarin (  COUMADIN) 2.5 MG tablet Take 2.5 mg by mouth every evening.    . warfarin (COUMADIN) 5 MG tablet TAKE 1/2 TO 1 TABLET DAILY AS DIRECTED BY COUMADIN CLINIC. 30 tablet 1   No current facility-administered medications for this visit.      Past Medical History:  Diagnosis Date  . Carotid artery stenosis    Without infarction  . Chronic combined systolic (congestive) and diastolic (congestive) heart failure (HCC)   . COPD (chronic obstructive pulmonary disease) (HCC)   . Coronary artery disease   . Heart valve replaced by other means     . Hypercholesterolemia    Pure  . Hypertension    Unspecified  . LBBB (left bundle branch block)   . Macular degeneration (senile) of retina, unspecified   . On home O2   . Postsurgical aortocoronary bypass status   . Stroke (HCC)   . Transient global amnesia   . Unspecified hereditary and idiopathic peripheral neuropathy   . Unspecified vitamin D deficiency     Past Surgical History:  Procedure Laterality Date  . AORTIC VALVE REPLACEMENT    . APPENDECTOMY    . CORONARY ARTERY BYPASS GRAFT    . HIP PINNING,CANNULATED Right 07/18/2018   Procedure: RIGHT CANNULATED HIP PINNING;  Surgeon: Myrene Galas, MD;  Location: [facility] OR;  Service: Orthopedics;  Laterality: Right;  . TONSILLECTOMY      Social History   Socioeconomic History  . Marital status: Widowed    Spouse name: Not on file  . Number of children: 2  . Years of education: 12th  . Highest education level: Not on file  Occupational History    Employer: RETIRED  Social Needs  . Financial resource strain: Not on file  . Food insecurity:    Worry: Not on file    Inability: Not on file  . Transportation needs:    Medical: Not on file    Non-medical: Not on file  Tobacco Use  . Smoking status: Former Smoker    Packs/day: 0.50    Types: Cigarettes    Last attempt to quit: 11/30/1991    Years since quitting: 26.8  . Smokeless tobacco: Never Used  . Tobacco comment: Tobacco use-no  Substance and Sexual Activity  . Alcohol use: No  . Drug use: No  . Sexual activity: Never  Lifestyle  . Physical activity:    Days per week: Not on file    Minutes per session: Not on file  . Stress: Not on file  Relationships  . Social connections:    Talks on phone: Not on file    Gets together: Not on file    Attends religious service: Not on file    Active member of club or organization: Not on file    Attends meetings of clubs or organizations: Not on file    Relationship status: Not on file  . Intimate partner violence:     Fear of current or ex partner: Not on file    Emotionally abused: Not on file    Physically abused: Not on file    Forced sexual activity: Not on file  Other Topics Concern  . Not on file  Social History Narrative   Pt lives at home alone.   Caffeine Use: very rarely    Family History  Problem Relation Age of Onset  . Heart attack Father   . Stroke Sister   . Neuropathy Brother   . COPD Sister     ROS: no  fevers or chills, productive cough, hemoptysis, dysphasia, odynophagia, melena, hematochezia, dysuria, hematuria, rash, seizure activity, orthopnea, PND, pedal edema, claudication. Remaining systems are negative.  Physical Exam: Well-developed well-nourished in no acute distress.  Skin is warm and dry.  HEENT is normal.  Neck is supple.  Chest is clear to auscultation with normal expansion.  Cardiovascular exam is regular rate and rhythm. Crisp mechanical valve sound; 2/6 systolic and diastolic murmur Abdominal exam nontender or distended. No masses palpated. Extremities show trace edema. neuro grossly intact   A/P  1 coronary artery disease-plan to continue statin.  Aspirin had been discontinued previously because of thigh hematoma.  2 prior aortic valve replacement-plan to continue Coumadin.  As outlined above aspirin was discontinued because of thigh hematoma.  Continue SBE prophylaxis.  We would like to be conservative with management as she is nearing 84 years old.  3 cardiomyopathy-plan conservative measures given patient's age.  We will continue with beta-blockade.  ARB inhibitor held previously because of borderline blood pressure.  Blood pressure appears to have improved.  I will try losartan 25 mg daily.  Follow blood pressure and advance as needed.  I do not think her blood pressure will tolerate Entresto.  Check potassium and renal function in 1 week.  Previous echocardiogram was personally reviewed and though ejection fraction was reported at 50 to 55% I felt it  was 35%.  4 chronic systolic congestive heart failure-she appears to be euvolemic today on examination.  We will continue with present dose of diuretic.  She takes 20 mg daily and an additional 20 mg as needed for weight gain of 2 pounds.  Add spironolactone 12.5 mg daily.  Continue low-sodium diet and fluid restriction.  5 hypertension-patient's blood pressure is controlled.  Continue present medications and follow.  6 hyperlipidemia-continue statin.  7 carotid artery disease-mild on last carotid Dopplers.  [name], MD

## 2018-09-22 ENCOUNTER — Telehealth: Payer: Self-pay | Admitting: Adult Health

## 2018-09-22 DIAGNOSIS — I2510 Atherosclerotic heart disease of native coronary artery without angina pectoris: Secondary | ICD-10-CM | POA: Diagnosis not present

## 2018-09-22 DIAGNOSIS — G609 Hereditary and idiopathic neuropathy, unspecified: Secondary | ICD-10-CM | POA: Diagnosis not present

## 2018-09-22 DIAGNOSIS — I110 Hypertensive heart disease with heart failure: Secondary | ICD-10-CM | POA: Diagnosis not present

## 2018-09-22 DIAGNOSIS — S72001D Fracture of unspecified part of neck of right femur, subsequent encounter for closed fracture with routine healing: Secondary | ICD-10-CM | POA: Diagnosis not present

## 2018-09-22 DIAGNOSIS — I5042 Chronic combined systolic (congestive) and diastolic (congestive) heart failure: Secondary | ICD-10-CM | POA: Diagnosis not present

## 2018-09-22 DIAGNOSIS — J449 Chronic obstructive pulmonary disease, unspecified: Secondary | ICD-10-CM | POA: Diagnosis not present

## 2018-09-22 NOTE — Telephone Encounter (Signed)
New message:      Pt's daughter is calling and states that they received a "No Show" letter in reference to an appt on 09/18/18 that she states they didn't make. She states she talked to 3 triage nurses on 09/15/18 and neither one of them told them that an appt would be made for the patient.

## 2018-09-25 DIAGNOSIS — I2510 Atherosclerotic heart disease of native coronary artery without angina pectoris: Secondary | ICD-10-CM | POA: Diagnosis not present

## 2018-09-25 DIAGNOSIS — G609 Hereditary and idiopathic neuropathy, unspecified: Secondary | ICD-10-CM | POA: Diagnosis not present

## 2018-09-25 DIAGNOSIS — J449 Chronic obstructive pulmonary disease, unspecified: Secondary | ICD-10-CM | POA: Diagnosis not present

## 2018-09-25 DIAGNOSIS — I110 Hypertensive heart disease with heart failure: Secondary | ICD-10-CM | POA: Diagnosis not present

## 2018-09-25 DIAGNOSIS — I5042 Chronic combined systolic (congestive) and diastolic (congestive) heart failure: Secondary | ICD-10-CM | POA: Diagnosis not present

## 2018-09-25 DIAGNOSIS — S72001D Fracture of unspecified part of neck of right femur, subsequent encounter for closed fracture with routine healing: Secondary | ICD-10-CM | POA: Diagnosis not present

## 2018-09-26 ENCOUNTER — Other Ambulatory Visit: Payer: Self-pay

## 2018-09-26 DIAGNOSIS — I2510 Atherosclerotic heart disease of native coronary artery without angina pectoris: Secondary | ICD-10-CM | POA: Diagnosis not present

## 2018-09-26 DIAGNOSIS — I110 Hypertensive heart disease with heart failure: Secondary | ICD-10-CM | POA: Diagnosis not present

## 2018-09-26 DIAGNOSIS — S72001D Fracture of unspecified part of neck of right femur, subsequent encounter for closed fracture with routine healing: Secondary | ICD-10-CM | POA: Diagnosis not present

## 2018-09-26 DIAGNOSIS — G609 Hereditary and idiopathic neuropathy, unspecified: Secondary | ICD-10-CM | POA: Diagnosis not present

## 2018-09-26 DIAGNOSIS — I5042 Chronic combined systolic (congestive) and diastolic (congestive) heart failure: Secondary | ICD-10-CM | POA: Diagnosis not present

## 2018-09-26 DIAGNOSIS — J449 Chronic obstructive pulmonary disease, unspecified: Secondary | ICD-10-CM | POA: Diagnosis not present

## 2018-09-26 NOTE — Patient Outreach (Signed)
[facility]) Care Management   09/26/2018  [name] 1926/03/25 [id number]  [name] is an 84 y.o. female  Subjective:  Home visit complete. [name] was alert, oriented and ambulating well. Caregiver and Home Health OT present during the visit.  Objective:   BP (!) 120/56 (BP Location: Left Arm, Patient Position: Sitting, Cuff Size: Normal)   Pulse 62   Resp 18   Ht 1.575 m (5' 2" )   Wt 105 lb (47.6 kg)   SpO2 99%   BMI 19.20 kg/m   Review of Systems  Constitutional: Negative.   HENT: Positive for hearing loss.        Difficulty hearing without hearing device.  Eyes: Negative.   Respiratory: Positive for cough and sputum production.        Reported clear, thin sputum.  Cardiovascular: Positive for leg swelling.  Gastrointestinal: Negative.   Genitourinary: Negative for dysuria and hematuria.  Musculoskeletal: Negative for falls.  Skin: Negative.   Neurological: Negative for dizziness.  Psychiatric/Behavioral: Negative.     Physical Exam  Constitutional: She is oriented to person, place, and time. She appears well-developed.  Cardiovascular: Normal rate.  Respiratory: Effort normal and breath sounds normal.  GI: Soft. Bowel sounds are normal.  Musculoskeletal: She exhibits edema.  Mild edema to both legs.  Neurological: She is alert and oriented to person, place, and time.  Skin: Skin is warm and dry.  Psychiatric: She has a normal mood and affect. Her behavior is normal. Judgment and thought content normal.    Encounter Medications:   Outpatient Encounter Medications as of 09/26/2018  Medication Sig Note  . acetaminophen (TYLENOL) 325 MG tablet Take 650 mg by mouth every 4 (four) hours as needed.   Marland Kitchen acetaminophen (TYLENOL) 500 MG tablet Take 1 tablet (500 mg total) by mouth every 8 (eight) hours.   . ALPRAZolam (XANAX) 0.25 MG tablet Take 0.25 mg by mouth at bedtime as needed for anxiety.   Marland Kitchen atorvastatin (LIPITOR) 40 MG  tablet Take 1 tablet (40 mg total) by mouth daily.   Roseanne Kaufman Peru-Castor Oil (VENELEX) OINT Apply to sacrum and to bilateral buttocks qshift and prn   . carvedilol (COREG) 6.25 MG tablet Take 6.25 mg by mouth 2 (two) times daily with a meal.   . docusate sodium (COLACE) 100 MG capsule Take 200 mg by mouth at bedtime.   . donepezil (ARICEPT) 10 MG tablet TAKE ONE TABLET BY MOUTH ONCE DAILY.   . ergocalciferol (VITAMIN D2) 50000 UNITS capsule Take 50,000 Units by mouth every Tuesday.    . ferrous sulfate 325 (65 FE) MG EC tablet Take 325 mg by mouth 2 (two) times daily.   . furosemide (LASIX) 20 MG tablet Take 1 tablet (20 mg total) by mouth daily.   Marland Kitchen HYDROcodone-acetaminophen (NORCO/VICODIN) 5-325 MG tablet Take 1-2 tablets by mouth every 6 (six) hours as needed for moderate pain.   . hydrocortisone (ANUSOL-HC) 2.5 % rectal cream Place 1 application rectally 3 (three) times daily as needed for hemorrhoids or anal itching.   . levalbuterol (XOPENEX) 0.63 MG/3ML nebulizer solution Take 3 mLs (0.63 mg total) by nebulization every 8 (eight) hours as needed for wheezing or shortness of breath.   . meclizine (ANTIVERT) 25 MG tablet Take 25 mg by mouth 3 (three) times daily as needed for dizziness.   . ondansetron (ZOFRAN) 4 MG tablet Take 4 mg by mouth every 8 (eight) hours as needed for nausea or vomiting.   Marland Kitchen  polyethylene glycol (MIRALAX / GLYCOLAX) packet Take 17 g by mouth daily.   . pregabalin (LYRICA) 75 MG capsule Take 1 capsule (75 mg total) by mouth every other day.   . traMADol (ULTRAM) 50 MG tablet Take 1 tablet (50 mg total) by mouth every 6 (six) hours as needed.   . umeclidinium-vilanterol (ANORO ELLIPTA) 62.5-25 MCG/INH AEPB Inhale 1 puff into the lungs daily.    Marland Kitchen warfarin (COUMADIN) 2.5 MG tablet Take 2.5 mg by mouth every evening. 09/05/2018: Reported taking 2.5 except on Saturday.  . warfarin (COUMADIN) 5 MG tablet TAKE 1/2 TO 1 TABLET DAILY AS DIRECTED BY COUMADIN CLINIC.    No  facility-administered encounter medications on file as of 09/26/2018.     Functional Status:   In your present state of health, do you have any difficulty performing the following activities: 09/05/2018 07/24/2018  Hearing? Tempie Donning  Vision? N N  Difficulty concentrating or making decisions? N N  Walking or climbing stairs? Y Y  Comment Post ortho surgery. -  Dressing or bathing? N Y  Doing errands, shopping? Tempie Donning  Preparing Food and eating ? N -  Using the Toilet? N -  In the past six months, have you accidently leaked urine? Y -  Comment Wears adult briefs. -  Do you have problems with loss of bowel control? N -  Managing your Medications? Y -  Comment Patient reported daughter prepares medications. -  Managing your Finances? Y -  Comment Daughter is POC. -  Housekeeping or managing your Housekeeping? Y -  Comment In home caregiver. -  Some recent data might be hidden    Fall/Depression Screening:    Fall Risk  09/26/2018 09/05/2018  Falls in the past year? 1 Yes  Number falls in past yr: 1 2 or more  Injury with Fall? - No  Risk Factor Category  - High Fall Risk  Risk for fall due to : History of fall(s) -  Follow up - Education provided;Falls prevention discussed   PHQ 2/9 Scores 09/26/2018 09/05/2018  PHQ - 2 Score 0 0    Assessment:   Home visit complete.  [name] reported stiffness to right knee but otherwise feeling very well. Reported compliance with medications and daily weights. Denied falls. Denied shortness of breath or chest discomfort. She did not attend her scheduled Cardiology appointment on last week but will follow up on next Tuesday. Reported morning weight of 105lbs.   [name] denied urgent concerns. Plan of care includes Home Health PT twice a week for three more weeks, possible Home Health OT closure on Friday, and continued outreach from [employer]. Pending PCP and Cardiology appointments on next week.  [facility] CM Care Plan Problem  One     Most Recent Value  Care Plan Problem One  Risk for Readmission  Role Documenting the Problem One  Care Management [place] for Problem One  Active  THN Long Term Goal   Patient will not be rehospitalized over the next 60 days.  [facility]N Long Term Goal Start Date  09/05/18  Interventions for Problem One Long Term Goal  Reviewed medications and weights.   [facility] CM Short Term Goal #1   Over the next 30 days patient will take medications as prescribed.  [facility] CM Short Term Goal #1 Start Date  09/05/18  [facility] CM Short Term Goal #1 Met Date  09/26/18  Interventions for Short Term Goal #1  Discussed medications. Patient reports  taking medications as prescribed. [Reported no medication changes.]  [facility] CM Short Term Goal #2   Over the next 30 days patient will attend all scheduled follow up appointment.  [facility] CM Short Term Goal #2 Start Date  09/05/18  Interventions for Short Term Goal #2  Reviewed pending PCP and specialty appointments. Encouraged to attend follow ups as scheduled.   [facility] CM Short Term Goal #3  Patient will weigh daily over the next 30 days.  [facility] CM Short Term Goal #3 Start Date  09/05/18  [facility] CM Short Term Goal #3 Met Date  09/26/18    [facility] CM Care Plan Problem Two     Most Recent Value  Care Plan Problem Two  High risk for falls.  Role Documenting the Problem Two  Care Management Coordinator  Care Plan for Problem Two  Active  Interventions for Problem Two Long Term Goal   Discussed safety and fall precautions. [PT arrived during visit. Reinforced gait training.]  THN Long Term Goal  Patient will not have fall related injuries over the next 60 days.  THN Long Term Goal Start Date  09/05/18  [facility] CM Short Term Goal #1   Over the next 30 days patient will use assistive device when ambulating in the home.  [facility] CM Short Term Goal #1 Start Date  09/05/18  Interventions for Short Term Goal #2   Reinforced instructions regarding use of walker and cane.  [facility] CM Short Term Goal  #2   Over the next 30 days patient will allow time to adjust and rise slowly before ambulating.  [facility] CM Short Term Goal #2 Start Date  09/05/18  Interventions for Short Term Goal #2  Patient encouraged to ambulate slowly and remove trip hazards prior to ambulating.       PLAN Will follow up in two weeks.   [place] [phone]

## 2018-09-27 ENCOUNTER — Other Ambulatory Visit: Payer: Self-pay | Admitting: Pharmacist

## 2018-09-27 ENCOUNTER — Ambulatory Visit: Payer: Self-pay | Admitting: Pharmacist

## 2018-09-27 DIAGNOSIS — I2510 Atherosclerotic heart disease of native coronary artery without angina pectoris: Secondary | ICD-10-CM | POA: Diagnosis not present

## 2018-09-27 DIAGNOSIS — I5042 Chronic combined systolic (congestive) and diastolic (congestive) heart failure: Secondary | ICD-10-CM | POA: Diagnosis not present

## 2018-09-27 DIAGNOSIS — I110 Hypertensive heart disease with heart failure: Secondary | ICD-10-CM | POA: Diagnosis not present

## 2018-09-27 DIAGNOSIS — G609 Hereditary and idiopathic neuropathy, unspecified: Secondary | ICD-10-CM | POA: Diagnosis not present

## 2018-09-27 DIAGNOSIS — J449 Chronic obstructive pulmonary disease, unspecified: Secondary | ICD-10-CM | POA: Diagnosis not present

## 2018-09-27 DIAGNOSIS — S72001D Fracture of unspecified part of neck of right femur, subsequent encounter for closed fracture with routine healing: Secondary | ICD-10-CM | POA: Diagnosis not present

## 2018-09-27 NOTE — Patient Outreach (Signed)
[facility]) Care Management  09/27/2018  Josephine CablesRuth E [name] Apr 19, 1926 [id number]   Incoming call from Ms [name]'s daughter Lupita LeashDonna regarding patient's levalbuterol (Xopenex) nebs.  She states that this neb was not covered under Part D.  Call placed to [facility] to inquire about Part B billing for nebs.  They are unable to bill anything other than albuterol and ipratropium.  PLAN: -Will call daughter to let her know [facility] will sign off at this time, but family encouraged to call back if additional Pharmacy services needed  Kieth BrightlyJulie Dattero Luverne Zerkle, PharmD, [facility] Clinical Pharmacist [facility]  [phone]

## 2018-09-28 DIAGNOSIS — J449 Chronic obstructive pulmonary disease, unspecified: Secondary | ICD-10-CM | POA: Diagnosis not present

## 2018-09-28 DIAGNOSIS — I110 Hypertensive heart disease with heart failure: Secondary | ICD-10-CM | POA: Diagnosis not present

## 2018-09-28 DIAGNOSIS — G609 Hereditary and idiopathic neuropathy, unspecified: Secondary | ICD-10-CM | POA: Diagnosis not present

## 2018-09-28 DIAGNOSIS — I5042 Chronic combined systolic (congestive) and diastolic (congestive) heart failure: Secondary | ICD-10-CM | POA: Diagnosis not present

## 2018-09-28 DIAGNOSIS — I2510 Atherosclerotic heart disease of native coronary artery without angina pectoris: Secondary | ICD-10-CM | POA: Diagnosis not present

## 2018-09-28 DIAGNOSIS — S72001D Fracture of unspecified part of neck of right femur, subsequent encounter for closed fracture with routine healing: Secondary | ICD-10-CM | POA: Diagnosis not present

## 2018-09-29 DIAGNOSIS — S72001D Fracture of unspecified part of neck of right femur, subsequent encounter for closed fracture with routine healing: Secondary | ICD-10-CM | POA: Diagnosis not present

## 2018-09-29 DIAGNOSIS — I5042 Chronic combined systolic (congestive) and diastolic (congestive) heart failure: Secondary | ICD-10-CM | POA: Diagnosis not present

## 2018-09-29 DIAGNOSIS — I2510 Atherosclerotic heart disease of native coronary artery without angina pectoris: Secondary | ICD-10-CM | POA: Diagnosis not present

## 2018-09-29 DIAGNOSIS — G609 Hereditary and idiopathic neuropathy, unspecified: Secondary | ICD-10-CM | POA: Diagnosis not present

## 2018-09-29 DIAGNOSIS — J449 Chronic obstructive pulmonary disease, unspecified: Secondary | ICD-10-CM | POA: Diagnosis not present

## 2018-09-29 DIAGNOSIS — I110 Hypertensive heart disease with heart failure: Secondary | ICD-10-CM | POA: Diagnosis not present

## 2018-10-02 ENCOUNTER — Other Ambulatory Visit: Payer: Self-pay | Admitting: Pharmacy Technician

## 2018-10-02 ENCOUNTER — Other Ambulatory Visit: Payer: Self-pay | Admitting: Pharmacist

## 2018-10-02 DIAGNOSIS — I5042 Chronic combined systolic (congestive) and diastolic (congestive) heart failure: Secondary | ICD-10-CM | POA: Diagnosis not present

## 2018-10-02 DIAGNOSIS — I2510 Atherosclerotic heart disease of native coronary artery without angina pectoris: Secondary | ICD-10-CM | POA: Diagnosis not present

## 2018-10-02 DIAGNOSIS — J449 Chronic obstructive pulmonary disease, unspecified: Secondary | ICD-10-CM | POA: Diagnosis not present

## 2018-10-02 DIAGNOSIS — S72001D Fracture of unspecified part of neck of right femur, subsequent encounter for closed fracture with routine healing: Secondary | ICD-10-CM | POA: Diagnosis not present

## 2018-10-02 DIAGNOSIS — G609 Hereditary and idiopathic neuropathy, unspecified: Secondary | ICD-10-CM | POA: Diagnosis not present

## 2018-10-02 DIAGNOSIS — I110 Hypertensive heart disease with heart failure: Secondary | ICD-10-CM | POA: Diagnosis not present

## 2018-10-02 NOTE — Patient Outreach (Signed)
[facility]) Care Management  10/02/2018  Colleen CablesRuth E [name] 1926-09-12 [id number]   Received [employer] patient assistance referral from [facility] Vanice SarahJulie Pruitt for [employer]. Raynelle FanningJulie took patient portion of application to patients home. I faxed provider portion of application to Dr. Juanetta GoslingHawkins.  Will follow up with Dr. Juanetta GoslingHawkins office once I have received patient portion of application.  Suzan SlickAshley N. Effie Shyoleman, CPhT Certified Pharmacy Technician [facility] Care Management Direct Dial:[phone]

## 2018-10-03 ENCOUNTER — Ambulatory Visit (INDEPENDENT_AMBULATORY_CARE_PROVIDER_SITE_OTHER): Payer: Medicare Other | Admitting: Pharmacist Clinician (PhC)/ Clinical Pharmacy Specialist

## 2018-10-03 ENCOUNTER — Encounter: Payer: Self-pay | Admitting: Cardiology

## 2018-10-03 ENCOUNTER — Ambulatory Visit (INDEPENDENT_AMBULATORY_CARE_PROVIDER_SITE_OTHER): Payer: Medicare Other | Admitting: Cardiology

## 2018-10-03 ENCOUNTER — Telehealth: Payer: Self-pay | Admitting: Cardiology

## 2018-10-03 VITALS — BP 120/52 | HR 68 | Ht 62.5 in | Wt 108.8 lb

## 2018-10-03 DIAGNOSIS — E7800 Pure hypercholesterolemia, unspecified: Secondary | ICD-10-CM | POA: Diagnosis not present

## 2018-10-03 DIAGNOSIS — Z952 Presence of prosthetic heart valve: Secondary | ICD-10-CM

## 2018-10-03 DIAGNOSIS — I5022 Chronic systolic (congestive) heart failure: Secondary | ICD-10-CM

## 2018-10-03 DIAGNOSIS — I10 Essential (primary) hypertension: Secondary | ICD-10-CM

## 2018-10-03 DIAGNOSIS — Z5181 Encounter for therapeutic drug level monitoring: Secondary | ICD-10-CM | POA: Diagnosis not present

## 2018-10-03 DIAGNOSIS — I6529 Occlusion and stenosis of unspecified carotid artery: Secondary | ICD-10-CM

## 2018-10-03 DIAGNOSIS — I359 Nonrheumatic aortic valve disorder, unspecified: Secondary | ICD-10-CM

## 2018-10-03 LAB — POCT INR
INR: 2.9 (ref 2.0–3.0)
INR: 2.9 (ref 2.0–3.0)

## 2018-10-03 MED ORDER — SPIRONOLACTONE 25 MG PO TABS: 12.5000 mg | ORAL_TABLET | Freq: Every day | ORAL | 3 refills | Status: DC

## 2018-10-03 MED ORDER — LOSARTAN POTASSIUM 25 MG PO TABS: 25.0000 mg | ORAL_TABLET | Freq: Every day | ORAL | 3 refills | Status: DC

## 2018-10-03 MED ORDER — FUROSEMIDE 20 MG PO TABS: ORAL_TABLET | ORAL | 3 refills | Status: DC

## 2018-10-03 NOTE — Patient Instructions (Signed)
Description   Continue taking same dosage 1/2 a pill (2.5 mg) every day except 1 pill (5 mg) on Saturdays. Recheck in 2 weeks.  Then call Shanda BumpsJessica with orders. [phone]  Coumadin Clinic [phone] Main [phone]

## 2018-10-03 NOTE — Patient Instructions (Addendum)
Medication Instructions:  START LOSARTAN 25 MG ONCE DAILY  START SPIRONOLACTONE 12.5 MG ONCE DAILY=1/2 OF THE 25 MG TABLET ONCE DAILY If you need a refill on your cardiac medications before your next appointment, please call your pharmacy.   Lab work: Your physician recommends that you return for lab work in: ONE WEEK If you have labs (blood work) drawn today and your tests are completely normal, you will receive your results only by: Marland Kitchen. MyChart Message (if you have MyChart) OR . A paper copy in the mail If you have any lab test that is abnormal or we need to change your treatment, we will call you to review the results.  Follow-Up: Your physician recommends that you schedule a follow-up appointment in: 3 MONTHS WITH DR Jens SomRENSHAW

## 2018-10-03 NOTE — Telephone Encounter (Signed)
New message:      Pt's daughter is calling and states they need the orders for the INR home checks to be sent to [place] from [facility]. Please advise.

## 2018-10-03 NOTE — Patient Outreach (Signed)
[facility]) Care Management  10/03/2018  ANIELLE HEADRICK 19-Mar-1926 [id number]   Successful outreach call to Ms. Anderson [place] daughter, [name] with HIPAA identifiers verified.  Explained to [name] that [employer] is unable to bill patient's Xopenex through Part B (only able to bill albuterol nebs and ipratropium) at this facility.  Patient still able to obtain Xopenex nebs with $40 cash price.    Patient also in need of financial assistance for Anora Ellipta inhaler. She is currently on a fixed income and has met the OOP spending for Melvina.  She is currently paying >$40/month for this medication.  Medication assistance application was delivered to daughter's home per request.  [facility] CPhT, Etter Sjogren assisting with process.   PLAN: -I will follow up within two weeks to ensure application mailed back  Regina Eck, PharmD, [place]  [phone]

## 2018-10-03 NOTE — Telephone Encounter (Signed)
Orders given for BMET and INR check in 1 week.  Given to [place] Bay[place].

## 2018-10-04 DIAGNOSIS — J449 Chronic obstructive pulmonary disease, unspecified: Secondary | ICD-10-CM | POA: Diagnosis not present

## 2018-10-04 DIAGNOSIS — J9611 Chronic respiratory failure with hypoxia: Secondary | ICD-10-CM | POA: Diagnosis not present

## 2018-10-04 DIAGNOSIS — I10 Essential (primary) hypertension: Secondary | ICD-10-CM | POA: Diagnosis not present

## 2018-10-04 DIAGNOSIS — I5020 Unspecified systolic (congestive) heart failure: Secondary | ICD-10-CM | POA: Diagnosis not present

## 2018-10-05 DIAGNOSIS — J449 Chronic obstructive pulmonary disease, unspecified: Secondary | ICD-10-CM | POA: Diagnosis not present

## 2018-10-05 DIAGNOSIS — G609 Hereditary and idiopathic neuropathy, unspecified: Secondary | ICD-10-CM | POA: Diagnosis not present

## 2018-10-05 DIAGNOSIS — S72001D Fracture of unspecified part of neck of right femur, subsequent encounter for closed fracture with routine healing: Secondary | ICD-10-CM | POA: Diagnosis not present

## 2018-10-05 DIAGNOSIS — I110 Hypertensive heart disease with heart failure: Secondary | ICD-10-CM | POA: Diagnosis not present

## 2018-10-05 DIAGNOSIS — I2510 Atherosclerotic heart disease of native coronary artery without angina pectoris: Secondary | ICD-10-CM | POA: Diagnosis not present

## 2018-10-05 DIAGNOSIS — I5042 Chronic combined systolic (congestive) and diastolic (congestive) heart failure: Secondary | ICD-10-CM | POA: Diagnosis not present

## 2018-10-06 DIAGNOSIS — G609 Hereditary and idiopathic neuropathy, unspecified: Secondary | ICD-10-CM | POA: Diagnosis not present

## 2018-10-06 DIAGNOSIS — I2510 Atherosclerotic heart disease of native coronary artery without angina pectoris: Secondary | ICD-10-CM | POA: Diagnosis not present

## 2018-10-06 DIAGNOSIS — S72001D Fracture of unspecified part of neck of right femur, subsequent encounter for closed fracture with routine healing: Secondary | ICD-10-CM | POA: Diagnosis not present

## 2018-10-06 DIAGNOSIS — I5042 Chronic combined systolic (congestive) and diastolic (congestive) heart failure: Secondary | ICD-10-CM | POA: Diagnosis not present

## 2018-10-06 DIAGNOSIS — I110 Hypertensive heart disease with heart failure: Secondary | ICD-10-CM | POA: Diagnosis not present

## 2018-10-06 DIAGNOSIS — J449 Chronic obstructive pulmonary disease, unspecified: Secondary | ICD-10-CM | POA: Diagnosis not present

## 2018-10-09 ENCOUNTER — Other Ambulatory Visit: Payer: Self-pay | Admitting: Pharmacy Technician

## 2018-10-09 DIAGNOSIS — I110 Hypertensive heart disease with heart failure: Secondary | ICD-10-CM | POA: Diagnosis not present

## 2018-10-09 DIAGNOSIS — I5042 Chronic combined systolic (congestive) and diastolic (congestive) heart failure: Secondary | ICD-10-CM | POA: Diagnosis not present

## 2018-10-09 DIAGNOSIS — G609 Hereditary and idiopathic neuropathy, unspecified: Secondary | ICD-10-CM | POA: Diagnosis not present

## 2018-10-09 DIAGNOSIS — J449 Chronic obstructive pulmonary disease, unspecified: Secondary | ICD-10-CM | POA: Diagnosis not present

## 2018-10-09 DIAGNOSIS — I2510 Atherosclerotic heart disease of native coronary artery without angina pectoris: Secondary | ICD-10-CM | POA: Diagnosis not present

## 2018-10-09 DIAGNOSIS — S72001D Fracture of unspecified part of neck of right femur, subsequent encounter for closed fracture with routine healing: Secondary | ICD-10-CM | POA: Diagnosis not present

## 2018-10-09 NOTE — Patient Outreach (Signed)
[facility]) Care Management  10/09/2018  Josephine CablesRuth E [name] [birth year] [id number]   Received message from [facility] Vanice SarahJulie Pruitt that patients daughter, Lupita LeashDonna, had question about patient assistance application.  Successful call to Lupita LeashDonna, HIPAA identifiers verified. Lupita LeashDonna just wanted to confirm what documents she needed to send with application. She states that she will make them back in, in the next day or so.  Will submit completed application to [employer] once all documents have been received.  Suzan SlickAshley N. Effie Shyoleman, CPhT Certified Pharmacy Technician [facility] Care Management Direct Dial:[phone]

## 2018-10-10 ENCOUNTER — Ambulatory Visit (INDEPENDENT_AMBULATORY_CARE_PROVIDER_SITE_OTHER): Payer: Medicare Other

## 2018-10-10 ENCOUNTER — Other Ambulatory Visit: Payer: Self-pay

## 2018-10-10 ENCOUNTER — Telehealth: Payer: Self-pay

## 2018-10-10 ENCOUNTER — Encounter: Payer: Self-pay | Admitting: Cardiology

## 2018-10-10 DIAGNOSIS — I5042 Chronic combined systolic (congestive) and diastolic (congestive) heart failure: Secondary | ICD-10-CM | POA: Diagnosis not present

## 2018-10-10 DIAGNOSIS — Z952 Presence of prosthetic heart valve: Secondary | ICD-10-CM

## 2018-10-10 DIAGNOSIS — J449 Chronic obstructive pulmonary disease, unspecified: Secondary | ICD-10-CM | POA: Diagnosis not present

## 2018-10-10 DIAGNOSIS — I359 Nonrheumatic aortic valve disorder, unspecified: Secondary | ICD-10-CM

## 2018-10-10 DIAGNOSIS — S72001D Fracture of unspecified part of neck of right femur, subsequent encounter for closed fracture with routine healing: Secondary | ICD-10-CM | POA: Diagnosis not present

## 2018-10-10 DIAGNOSIS — I2510 Atherosclerotic heart disease of native coronary artery without angina pectoris: Secondary | ICD-10-CM | POA: Diagnosis not present

## 2018-10-10 DIAGNOSIS — G609 Hereditary and idiopathic neuropathy, unspecified: Secondary | ICD-10-CM | POA: Diagnosis not present

## 2018-10-10 DIAGNOSIS — Z5181 Encounter for therapeutic drug level monitoring: Secondary | ICD-10-CM | POA: Diagnosis not present

## 2018-10-10 DIAGNOSIS — I110 Hypertensive heart disease with heart failure: Secondary | ICD-10-CM | POA: Diagnosis not present

## 2018-10-10 LAB — POCT INR: INR: 3.4 — AB (ref 2.0–3.0)

## 2018-10-10 NOTE — Telephone Encounter (Signed)
Took inr and kristin dosed and called back pt

## 2018-10-10 NOTE — Patient Outreach (Signed)
[facility] ([facility]) Care Management  10/10/2018  [name] 07/28/1926 [id number]  Successful outreach with [name].  Reported shortness of breath after a short outing with her caregiver on yesterday. States symptoms resolved after using her rescue inhaler. Reported "feeling much better" today. Compliant with medications and daily weights. Morning weight of 105lbs.   Mrs. Shenberger is still receiving services with [facility]. Nurse and Physical therapist scheduled for tomorrow. Reports doing well with gait training and prefers using her cane when ambulating in the home. No falls reported.    [facility] CM Care Plan Problem One     Most Recent Value  Care Plan Problem One  Risk for Readmission  Role Documenting the Problem One  Care Management Coordinator  Care Plan for Problem One  Active  [facility] Long Term Goal   Patient will not be rehospitalized over the next 60 days.  [facility] Long Term Goal Start Date  09/05/18  Interventions for Problem One Long Term Goal  Discussed COPD and CHF zones. Reviewed compliance with daily weights.  [facility] CM Short Term Goal #1   Over the next 30 days patient will take medications as prescribed.  [facility] CM Short Term Goal #1 Start Date  09/05/18  [facility] CM Short Term Goal #1 Met Date  09/26/18  [facility] CM Short Term Goal #2   Over the next 30 days patient will attend all scheduled follow up appointment.  [facility] CM Short Term Goal #2 Start Date  09/05/18  [facility] CM Short Term Goal #2 Met Date  10/10/18  [facility] CM Short Term Goal #3  Patient will weigh daily over the next 30 days.  [facility] CM Short Term Goal #3 Start Date  09/05/18  [facility] CM Short Term Goal #3 Met Date  09/26/18    [facility] CM Care Plan Problem Two     Most Recent Value  Care Plan Problem Two  High risk for falls.  Role Documenting the Problem Two  Care Management Coordinator  Care Plan for Problem Two  Active  Interventions for Problem Two Long Term Goal   Reviewed fall precautions. Discussed progress  with gait training and using cane as primary assistive device. Discussed importance of taking short breaks and avoiding overexertion when ambulating.   [facility] Long Term Goal  Patient will not have fall related injuries over the next 60 days.  [facility] Long Term Goal Start Date  09/05/18  [facility] CM Short Term Goal #1   Over the next 30 days patient will use assistive device when ambulating in the home.  [facility] CM Short Term Goal #1 Start Date  09/05/18  [facility] CM Short Term Goal #1 Met Date   10/10/18  [facility] CM Short Term Goal #2   Over the next 30 days patient will allow time to adjust and rise slowly before ambulating.  [facility] CM Short Term Goal #2 Start Date  09/05/18  [facility] CM Short Term Goal #2 Met Date  10/10/18      PLAN Will follow up on next month.    ,RN [facility] Community Care Manager [phone]    

## 2018-10-16 ENCOUNTER — Other Ambulatory Visit: Payer: Self-pay | Admitting: Pharmacy Technician

## 2018-10-16 ENCOUNTER — Ambulatory Visit (INDEPENDENT_AMBULATORY_CARE_PROVIDER_SITE_OTHER): Payer: Medicare Other | Admitting: Cardiovascular Disease

## 2018-10-16 DIAGNOSIS — J449 Chronic obstructive pulmonary disease, unspecified: Secondary | ICD-10-CM | POA: Diagnosis not present

## 2018-10-16 DIAGNOSIS — I110 Hypertensive heart disease with heart failure: Secondary | ICD-10-CM | POA: Diagnosis not present

## 2018-10-16 DIAGNOSIS — I2510 Atherosclerotic heart disease of native coronary artery without angina pectoris: Secondary | ICD-10-CM | POA: Diagnosis not present

## 2018-10-16 DIAGNOSIS — Z5181 Encounter for therapeutic drug level monitoring: Secondary | ICD-10-CM

## 2018-10-16 DIAGNOSIS — Z952 Presence of prosthetic heart valve: Secondary | ICD-10-CM | POA: Diagnosis not present

## 2018-10-16 DIAGNOSIS — I359 Nonrheumatic aortic valve disorder, unspecified: Secondary | ICD-10-CM | POA: Diagnosis not present

## 2018-10-16 DIAGNOSIS — S72001D Fracture of unspecified part of neck of right femur, subsequent encounter for closed fracture with routine healing: Secondary | ICD-10-CM | POA: Diagnosis not present

## 2018-10-16 DIAGNOSIS — G609 Hereditary and idiopathic neuropathy, unspecified: Secondary | ICD-10-CM | POA: Diagnosis not present

## 2018-10-16 DIAGNOSIS — I5042 Chronic combined systolic (congestive) and diastolic (congestive) heart failure: Secondary | ICD-10-CM | POA: Diagnosis not present

## 2018-10-16 LAB — POCT INR: INR: 1.5 — AB (ref 2.0–3.0)

## 2018-10-16 NOTE — Patient Outreach (Signed)
[facility]) Care Management  10/16/2018  Josephine CablesRuth E [name] April 25, 1926 [id number]   Unsuccessful call to patients daughter, Lupita LeashDonna, HIPAA compliant voicemail left.  Will make 2nd call attempt in 2-3 business days.  Suzan SlickAshley N. Effie Shyoleman, CPhT Certified Pharmacy Technician [facility] Care Management Direct Dial:[phone]

## 2018-10-17 DIAGNOSIS — Z952 Presence of prosthetic heart valve: Secondary | ICD-10-CM

## 2018-10-17 DIAGNOSIS — I359 Nonrheumatic aortic valve disorder, unspecified: Secondary | ICD-10-CM

## 2018-10-17 DIAGNOSIS — Z5181 Encounter for therapeutic drug level monitoring: Secondary | ICD-10-CM

## 2018-10-17 NOTE — Progress Notes (Signed)
This encounter was created in error - please disregard.

## 2018-10-18 DIAGNOSIS — S72001D Fracture of unspecified part of neck of right femur, subsequent encounter for closed fracture with routine healing: Secondary | ICD-10-CM | POA: Diagnosis not present

## 2018-10-19 DIAGNOSIS — H353112 Nonexudative age-related macular degeneration, right eye, intermediate dry stage: Secondary | ICD-10-CM | POA: Diagnosis not present

## 2018-10-19 DIAGNOSIS — H35721 Serous detachment of retinal pigment epithelium, right eye: Secondary | ICD-10-CM | POA: Diagnosis not present

## 2018-10-19 DIAGNOSIS — H35352 Cystoid macular degeneration, left eye: Secondary | ICD-10-CM | POA: Diagnosis not present

## 2018-10-19 DIAGNOSIS — H353221 Exudative age-related macular degeneration, left eye, with active choroidal neovascularization: Secondary | ICD-10-CM | POA: Diagnosis not present

## 2018-10-19 DIAGNOSIS — H353211 Exudative age-related macular degeneration, right eye, with active choroidal neovascularization: Secondary | ICD-10-CM | POA: Diagnosis not present

## 2018-10-21 ENCOUNTER — Other Ambulatory Visit: Payer: Self-pay | Admitting: Cardiology

## 2018-10-23 ENCOUNTER — Other Ambulatory Visit: Payer: Self-pay | Admitting: Pharmacy Technician

## 2018-10-23 NOTE — Patient Outreach (Signed)
[facility]) Care Management  10/23/2018  Colleen CablesRuth E [name] 10-Jul-1926 [id number]    Follow up call placed to [employer] patient assistance regarding patient assistance application for Anoro, informed by Maralyn SagoSarah that patient has been aproved as of 12/07 unitl 11/14/18 and should received a 90 days supply in 10-14 business days..   Successful call placed to patients daughter [name] regarding patient assistance application, HIPAA identifiers verified. Updated [name] on approval and shipment info.  Will follow up with [name] in 10-14 business days to confirm medication has been received.  Suzan SlickAshley N. Effie Shyoleman, CPhT Certified Pharmacy Technician [facility] Care Management Direct Dial:[phone]

## 2018-10-24 ENCOUNTER — Ambulatory Visit (INDEPENDENT_AMBULATORY_CARE_PROVIDER_SITE_OTHER): Payer: Medicare Other | Admitting: *Deleted

## 2018-10-24 DIAGNOSIS — Z952 Presence of prosthetic heart valve: Secondary | ICD-10-CM | POA: Diagnosis not present

## 2018-10-24 DIAGNOSIS — I359 Nonrheumatic aortic valve disorder, unspecified: Secondary | ICD-10-CM | POA: Diagnosis not present

## 2018-10-24 DIAGNOSIS — Z5181 Encounter for therapeutic drug level monitoring: Secondary | ICD-10-CM | POA: Diagnosis not present

## 2018-10-24 LAB — POCT INR: INR: 2 (ref 2.0–3.0)

## 2018-10-24 NOTE — Patient Instructions (Signed)
Description   Start taking 1 tablet today then start taking 1/2 tablet daily except 1 tablet on Tuesdays and Saturdays.  Repeat INR in 9 days. Coumadin Clinic [phone]

## 2018-10-30 ENCOUNTER — Other Ambulatory Visit: Payer: Self-pay

## 2018-10-30 NOTE — Patient Outreach (Signed)
[facility]) Care Management  10/30/2018  Colleen CablesRuth E Simpson 1926/10/09 [id number]   Call received from [name]'s caregiver [name] regarding pending appointment. [name] will not be available on 11/02/2018. Appointment rescheduled for 11/10/2018.   PLAN Will follow up on 11/10/18.   Katha CabalFelecia Samiksha Pellicano,RN [facility] Community Care Manager [phone]

## 2018-10-31 ENCOUNTER — Other Ambulatory Visit: Payer: Self-pay | Admitting: *Deleted

## 2018-10-31 DIAGNOSIS — E876 Hypokalemia: Secondary | ICD-10-CM

## 2018-11-02 ENCOUNTER — Other Ambulatory Visit: Payer: Medicare Other

## 2018-11-02 ENCOUNTER — Ambulatory Visit: Payer: Self-pay

## 2018-11-02 ENCOUNTER — Ambulatory Visit (INDEPENDENT_AMBULATORY_CARE_PROVIDER_SITE_OTHER): Payer: Medicare Other | Admitting: Pharmacist

## 2018-11-02 DIAGNOSIS — E876 Hypokalemia: Secondary | ICD-10-CM | POA: Diagnosis not present

## 2018-11-02 DIAGNOSIS — Z5181 Encounter for therapeutic drug level monitoring: Secondary | ICD-10-CM | POA: Diagnosis not present

## 2018-11-02 DIAGNOSIS — I359 Nonrheumatic aortic valve disorder, unspecified: Secondary | ICD-10-CM

## 2018-11-02 DIAGNOSIS — Z952 Presence of prosthetic heart valve: Secondary | ICD-10-CM | POA: Diagnosis not present

## 2018-11-02 LAB — BASIC METABOLIC PANEL
BUN/Creatinine Ratio: 22 (ref 12–28)
BUN: 20 mg/dL (ref 10–36)
CALCIUM: 9.3 mg/dL (ref 8.7–10.3)
CHLORIDE: 100 mmol/L (ref 96–106)
CO2: 29 mmol/L (ref 20–29)
Creatinine, Ser: 0.92 mg/dL (ref 0.57–1.00)
GFR calc non Af Amer: 55 mL/min/{1.73_m2} — ABNORMAL LOW (ref >59)
GFR, EST AFRICAN AMERICAN: 63 mL/min/{1.73_m2} (ref >59)
Glucose: 115 mg/dL — ABNORMAL HIGH (ref 65–99)
POTASSIUM: 3.5 mmol/L (ref 3.5–5.2)
SODIUM: 145 mmol/L — AB (ref 134–144)

## 2018-11-02 LAB — POCT INR: INR: 2.4 (ref 2.0–3.0)

## 2018-11-02 NOTE — Patient Instructions (Signed)
Description   Continue 1/2 tablet daily except 1 tablet on Tuesdays and Saturdays.  Repeat INR in 2 weeks. Coumadin Clinic [phone]

## 2018-11-06 ENCOUNTER — Other Ambulatory Visit: Payer: Self-pay | Admitting: Pharmacy Technician

## 2018-11-06 NOTE — Patient Outreach (Signed)
[facility]) Care Management  11/06/2018  [name] 02/04/1926 [id number]    Successful call placed to patients daughter, [name] regarding patient assistance medication receipt from [employer], HIPAA identifiers verified. [name] confirms that patient received her 3 month supply of  Anoro Ellipta. Reviewed OOP spend requirement to reapply for program in 2020. [name] very appreciative of the [facility]!  Will route note to [facility] Vanice SarahJulie Pruitt for case closure.   Suzan SlickAshley N. Effie Shyoleman, CPhT Certified Pharmacy Technician [facility] Care Management Direct Dial:[phone]

## 2018-11-10 ENCOUNTER — Other Ambulatory Visit: Payer: Self-pay

## 2018-11-10 NOTE — Patient Outreach (Signed)
[facility]) Care Management   11/10/2018  [name] 05-Jun-1926 [id number]  [name] is an 84 y.o. female  Subjective:  RNCM in for home visit. Member alert and oriented x 3. Caregiver present at RNCM's arrival.  Objective:   BP (!) 122/56 (BP Location: Right Arm, Patient Position: Sitting, Cuff Size: Normal)   Pulse 63   Resp 17   Ht 1.651 m ('5\' 5"'$ )   Wt 107 lb (48.5 kg)   SpO2 99%   BMI 17.81 kg/m   Review of Systems  Constitutional: Negative.   HENT: Positive for hearing loss.        Wears hearing aide.  Eyes: Negative.   Respiratory: Positive for cough.   Cardiovascular: Positive for leg swelling.       Nonpitting edema to both lower extremities.  Gastrointestinal: Negative.   Genitourinary: Negative.   Musculoskeletal: Negative.   Skin: Negative.     Physical Exam  Constitutional: She is oriented to person, place, and time.  Cardiovascular: Normal rate.  Respiratory: Effort normal and breath sounds normal.  GI: Soft. Bowel sounds are normal.  Neurological: She is alert and oriented to person, place, and time.  Skin: Skin is warm and dry.  Psychiatric: She has a normal mood and affect. Her behavior is normal. Judgment and thought content normal.    Encounter Medications:   Outpatient Encounter Medications as of 11/10/2018  Medication Sig Note  . acetaminophen (TYLENOL) 325 MG tablet Take 650 mg by mouth every 4 (four) hours as needed.   Marland Kitchen acetaminophen (TYLENOL) 500 MG tablet Take 1 tablet (500 mg total) by mouth every 8 (eight) hours.   . ALPRAZolam (XANAX) 0.25 MG tablet Take 0.25 mg by mouth at bedtime as needed for anxiety.   Marland Kitchen atorvastatin (LIPITOR) 40 MG tablet Take 1 tablet (40 mg total) by mouth daily.   . carvedilol (COREG) 6.25 MG tablet TAKE (1) TABLET BY MOUTH TWICE DAILY.   Marland Kitchen docusate sodium (COLACE) 100 MG capsule Take 200 mg by mouth at bedtime.   . donepezil (ARICEPT) 10 MG tablet TAKE ONE TABLET BY MOUTH ONCE  DAILY.   . ergocalciferol (VITAMIN D2) 50000 UNITS capsule Take 50,000 Units by mouth every Tuesday.    . ferrous sulfate 325 (65 FE) MG EC tablet Take 325 mg by mouth 2 (two) times daily.   . furosemide (LASIX) 20 MG tablet TAKE ONE TABLET DAILY AND TAKE AN EXTRA 1 TABLET AS NEEDED   . HYDROcodone-acetaminophen (NORCO/VICODIN) 5-325 MG tablet Take 1-2 tablets by mouth every 6 (six) hours as needed for moderate pain.   . hydrocortisone (ANUSOL-HC) 2.5 % rectal cream Place 1 application rectally 3 (three) times daily as needed for hemorrhoids or anal itching.   . levalbuterol (XOPENEX) 0.63 MG/3ML nebulizer solution Take 0.63 mg by nebulization every evening.   Marland Kitchen losartan (COZAAR) 25 MG tablet Take 1 tablet (25 mg total) by mouth daily.   . meclizine (ANTIVERT) 25 MG tablet Take 25 mg by mouth 3 (three) times daily as needed for dizziness.   . Melatonin 3 MG TABS Take 1 tablet by mouth at bedtime.   . multivitamin-lutein (OCUVITE-LUTEIN) CAPS capsule Take 1 capsule by mouth daily.   . ondansetron (ZOFRAN) 4 MG tablet Take 4 mg by mouth every 8 (eight) hours as needed for nausea or vomiting.   . polyethylene glycol (MIRALAX / GLYCOLAX) packet Take 17 g by mouth daily as needed.    . pregabalin (LYRICA) 75  MG capsule Take 1 capsule (75 mg total) by mouth every other day.   . spironolactone (ALDACTONE) 25 MG tablet Take 0.5 tablets (12.5 mg total) by mouth daily.   . traMADol (ULTRAM) 50 MG tablet Take 1 tablet (50 mg total) by mouth every 6 (six) hours as needed.   . umeclidinium-vilanterol (ANORO ELLIPTA) 62.5-25 MCG/INH AEPB Inhale 1 puff into the lungs daily.    Marland Kitchen warfarin (COUMADIN) 2.5 MG tablet Take 2.5 mg by mouth every evening. 09/05/2018: Reported taking 2.5 except on Saturday.  . warfarin (COUMADIN) 5 MG tablet TAKE 1/2 TO 1 TABLET DAILY AS DIRECTED BY COUMADIN CLINIC.    No facility-administered encounter medications on file as of 11/10/2018.     Functional Status:   In your present  state of health, do you have any difficulty performing the following activities: 09/05/2018 07/24/2018  Hearing? [name]  Vision? N N  Difficulty concentrating or making decisions? N N  Walking or climbing stairs? Y Y  Comment Post ortho surgery. -  Dressing or bathing? N Y  Doing errands, shopping? [name]  Preparing Food and eating ? N -  Using the Toilet? N -  In the past six months, have you accidently leaked urine? Y -  Comment Wears adult briefs. -  Do you have problems with loss of bowel control? N -  Managing your Medications? Y -  Comment Patient reported daughter prepares medications. -  Managing your Finances? Y -  Comment Daughter is POC. -  Housekeeping or managing your Housekeeping? Y -  Comment In home caregiver. -  Some recent data might be hidden    Fall/Depression Screening:    Fall Risk  09/26/2018 09/05/2018  Falls in the past year? 1 Yes  Number falls in past yr: 1 2 or more  Injury with Fall? - No  Risk Factor Category  - High Fall Risk  Risk for fall due to : History of fall(s) -  Follow up - Education provided;Falls prevention discussed   PHQ 2/9 Scores 11/10/2018 09/26/2018 09/05/2018  PHQ - 2 Score 0 0 0    Assessment:   Home visit complete. [name] reported episodes of "stiffness" to her right knee but otherwise feeling very well. Denied complaints of shortness of breath or chest discomfort. Reported ambulating well in the home and exercises in the gym several days a week. She has been discharged from [place] services due to all goals being met. Reported compliance with medications, daily weights and monitors O2 saturations regularly.   Reviewed goals and care management needs. [name] has progressed well and denied urgent concerns. Reported several MD follow-ups within the next few weeks. Agreeable to follow-up outreach to review MD updates on next month. Will complete case closure if member has no further care management needs.  THN CM  Care Plan Problem One     Most Recent Value  Care Plan Problem One  Risk for Readmission  Role Documenting the Problem One  Care Management [place] for Problem One  Active  THN Long Term Goal   Patient will not be rehospitalized over the next 60 days.  THN Long Term Goal Start Date  09/05/18  THN Long Term Goal Met Date  11/10/18  THN CM Short Term Goal #1   Over the next 30 days patient will take medications as prescribed.  THN CM Short Term Goal #1 Start Date  09/05/18  THN CM Short Term Goal #1 Met  Date  09/26/18  THN CM Short Term Goal #2   Over the next 30 days patient will attend all scheduled follow up appointment.  THN CM Short Term Goal #2 Start Date  09/05/18  THN CM Short Term Goal #2 Met Date  10/10/18  THN CM Short Term Goal #3  Patient will weigh daily over the next 30 days.  THN CM Short Term Goal #3 Start Date  09/05/18  [facility] CM Short Term Goal #3 Met Date  09/26/18    [facility] CM Care Plan Problem Two     Most Recent Value  Care Plan Problem Two  High risk for falls.  Role Documenting the Problem Two  Care Management [place] for Problem Two  Active  THN Long Term Goal  Patient will not have fall related injuries over the next 60 days.  THN Long Term Goal Start Date  09/05/18  THN Long Term Goal Met Date  11/10/18  THN CM Short Term Goal #1   Over the next 30 days patient will use assistive device when ambulating in the home.  THN CM Short Term Goal #1 Start Date  09/05/18  THN CM Short Term Goal #1 Met Date   10/10/18  THN CM Short Term Goal #2   Over the next 30 days patient will allow time to adjust and rise slowly before ambulating.  THN CM Short Term Goal #2 Start Date  09/05/18  [facility] CM Short Term Goal #2 Met Date  10/10/18     PLAN Will follow up next month.  [place] [phone]

## 2018-11-14 ENCOUNTER — Other Ambulatory Visit: Payer: Self-pay | Admitting: Cardiology

## 2018-11-16 DIAGNOSIS — I10 Essential (primary) hypertension: Secondary | ICD-10-CM | POA: Diagnosis not present

## 2018-11-16 DIAGNOSIS — J449 Chronic obstructive pulmonary disease, unspecified: Secondary | ICD-10-CM | POA: Diagnosis not present

## 2018-11-16 DIAGNOSIS — I5020 Unspecified systolic (congestive) heart failure: Secondary | ICD-10-CM | POA: Diagnosis not present

## 2018-11-16 DIAGNOSIS — J9611 Chronic respiratory failure with hypoxia: Secondary | ICD-10-CM | POA: Diagnosis not present

## 2018-11-21 ENCOUNTER — Ambulatory Visit (INDEPENDENT_AMBULATORY_CARE_PROVIDER_SITE_OTHER): Payer: Medicare Other

## 2018-11-21 DIAGNOSIS — I359 Nonrheumatic aortic valve disorder, unspecified: Secondary | ICD-10-CM

## 2018-11-21 DIAGNOSIS — Z952 Presence of prosthetic heart valve: Secondary | ICD-10-CM

## 2018-11-21 DIAGNOSIS — Z5181 Encounter for therapeutic drug level monitoring: Secondary | ICD-10-CM | POA: Diagnosis not present

## 2018-11-21 LAB — POCT INR: INR: 1.6 — AB (ref 2.0–3.0)

## 2018-11-21 NOTE — Patient Instructions (Addendum)
Description   Take 1.5 tablets today, then start taking 1/2 tablet daily except 1 tablet on Tuesdays, Thursdays and Saturdays.  Repeat INR in 10 days.  [facility] [phone]

## 2018-11-30 ENCOUNTER — Ambulatory Visit (INDEPENDENT_AMBULATORY_CARE_PROVIDER_SITE_OTHER): Payer: Medicare Other

## 2018-11-30 DIAGNOSIS — Z5181 Encounter for therapeutic drug level monitoring: Secondary | ICD-10-CM

## 2018-11-30 DIAGNOSIS — I359 Nonrheumatic aortic valve disorder, unspecified: Secondary | ICD-10-CM | POA: Diagnosis not present

## 2018-11-30 DIAGNOSIS — Z952 Presence of prosthetic heart valve: Secondary | ICD-10-CM | POA: Diagnosis not present

## 2018-11-30 LAB — POCT INR: INR: 2.3 (ref 2.0–3.0)

## 2018-11-30 NOTE — Patient Instructions (Signed)
Description   Continue on same dosage 1/2 tablet daily except 1 tablet on Tuesdays, Thursdays and Saturdays.  Repeat INR in 2 weeks.  [facility] Clinic [phone]

## 2018-12-04 ENCOUNTER — Other Ambulatory Visit: Payer: Self-pay

## 2018-12-04 ENCOUNTER — Other Ambulatory Visit: Payer: Self-pay | Admitting: Pharmacist

## 2018-12-04 NOTE — Patient Outreach (Signed)
[place] [facility]) Care Management [place]  12/04/2018  SOLIANA KITKO [birth year] [id number]  Reason for referral: medication assistance/management  [facility] pharmacy case is being closed due to the following reasons:  Goals have been met.  Family and patient provided with [place] number to call back in additional services are needed.  No needs identified at this time.  Patient has been provided [facility] CM contact information if assistance needed in the future.    Thank you for allowing [facility] pharmacy to be involved in this patient's care.    Regina Eck, PharmD, [place]  [phone]

## 2018-12-04 NOTE — Patient Outreach (Signed)
[facility]) Care Management  12/04/2018  [name] 1926/05/03 [id number]  Successful outreach with [name]. She reported doing well and denied worsening symptoms since last home visit. Remains compliant with medications, diet recommendations, safety precautions, and daily weights. Reported weight range of 105-106lbs.  Reported ambulating well and participating in gym exercises three times a week. Denied falls. Her primary caregiver [name] lives in the home and readily available to assist as needed. Reported that her daughter [name] is also available.  [name] has attended MD appointments as scheduled and will follow-up with Orthopedic provider, Dr. Marcelino Scot on tomorrow.   [name] denied changes to care management needs and agreeable to case closure. Agreed to notify PCP if health status changes and [facility] outreach is needed.  THN CM Care Plan Problem One     Most Recent Value  Care Plan Problem One  Risk for Readmission  Role Documenting the Problem One  Care Management [place] for Problem One  Active  THN Long Term Goal   Patient will not be rehospitalized over the next 60 days.  THN Long Term Goal Start Date  09/05/18  THN Long Term Goal Met Date  11/10/18  THN CM Short Term Goal #1   Over the next 30 days patient will take medications as prescribed.  THN CM Short Term Goal #1 Start Date  09/05/18  [facility]N CM Short Term Goal #1 Met Date  09/26/18  THN CM Short Term Goal #2   Over the next 30 days patient will attend all scheduled follow up appointment.  THN CM Short Term Goal #2 Start Date  09/05/18  [facility]N CM Short Term Goal #2 Met Date  10/10/18  THN CM Short Term Goal #3  Patient will weigh daily over the next 30 days.  THN CM Short Term Goal #3 Start Date  09/05/18  [facility] CM Short Term Goal #3 Met Date  09/26/18    [facility] CM Care Plan Problem Two     Most Recent Value  Care Plan Problem Two  High risk for falls.  Role Documenting the Problem  Two  Care Management [place] for Problem Two  Active  THN Long Term Goal  Patient will not have fall related injuries over the next 60 days.  THN Long Term Goal Start Date  09/05/18  THN Long Term Goal Met Date  11/10/18  THN CM Short Term Goal #1   Over the next 30 days patient will use assistive device when ambulating in the home.  THN CM Short Term Goal #1 Start Date  09/05/18  THN CM Short Term Goal #1 Met Date   10/10/18  THN CM Short Term Goal #2   Over the next 30 days patient will allow time to adjust and rise slowly before ambulating.  THN CM Short Term Goal #2 Start Date  09/05/18  [facility] CM Short Term Goal #2 Met Date  10/10/18     PLAN Will complete case closure Will update PCP.   [place] [phone]

## 2018-12-05 DIAGNOSIS — M1611 Unilateral primary osteoarthritis, right hip: Secondary | ICD-10-CM | POA: Diagnosis not present

## 2018-12-05 DIAGNOSIS — S72001D Fracture of unspecified part of neck of right femur, subsequent encounter for closed fracture with routine healing: Secondary | ICD-10-CM | POA: Diagnosis not present

## 2018-12-14 ENCOUNTER — Ambulatory Visit (INDEPENDENT_AMBULATORY_CARE_PROVIDER_SITE_OTHER): Payer: Medicare Other | Admitting: Pharmacist

## 2018-12-14 ENCOUNTER — Other Ambulatory Visit: Payer: Self-pay | Admitting: Cardiology

## 2018-12-14 DIAGNOSIS — Z952 Presence of prosthetic heart valve: Secondary | ICD-10-CM | POA: Diagnosis not present

## 2018-12-14 DIAGNOSIS — Z5181 Encounter for therapeutic drug level monitoring: Secondary | ICD-10-CM | POA: Diagnosis not present

## 2018-12-14 DIAGNOSIS — I359 Nonrheumatic aortic valve disorder, unspecified: Secondary | ICD-10-CM | POA: Diagnosis not present

## 2018-12-14 LAB — POCT INR: INR: 2.3 (ref 2.0–3.0)

## 2018-12-14 NOTE — Patient Instructions (Signed)
Description   Continue on same dosage 1/2 tablet daily except 1 tablet on Tuesdays, Thursdays and Saturdays.  Repeat INR in 4 weeks.  [facility]in Clinic [phone]     

## 2018-12-15 IMAGING — CR DG HIP (WITH OR WITHOUT PELVIS) 1V*R*
3 series · 3 of 3 positions shown · non-contrast
Comparison: Pelvis and right hip films of 07/18/2017

CLINICAL DATA: Follow-up of fracture

EXAM:
DG HIP (WITH OR WITHOUT PELVIS) 1V RIGHT

[hip ap]
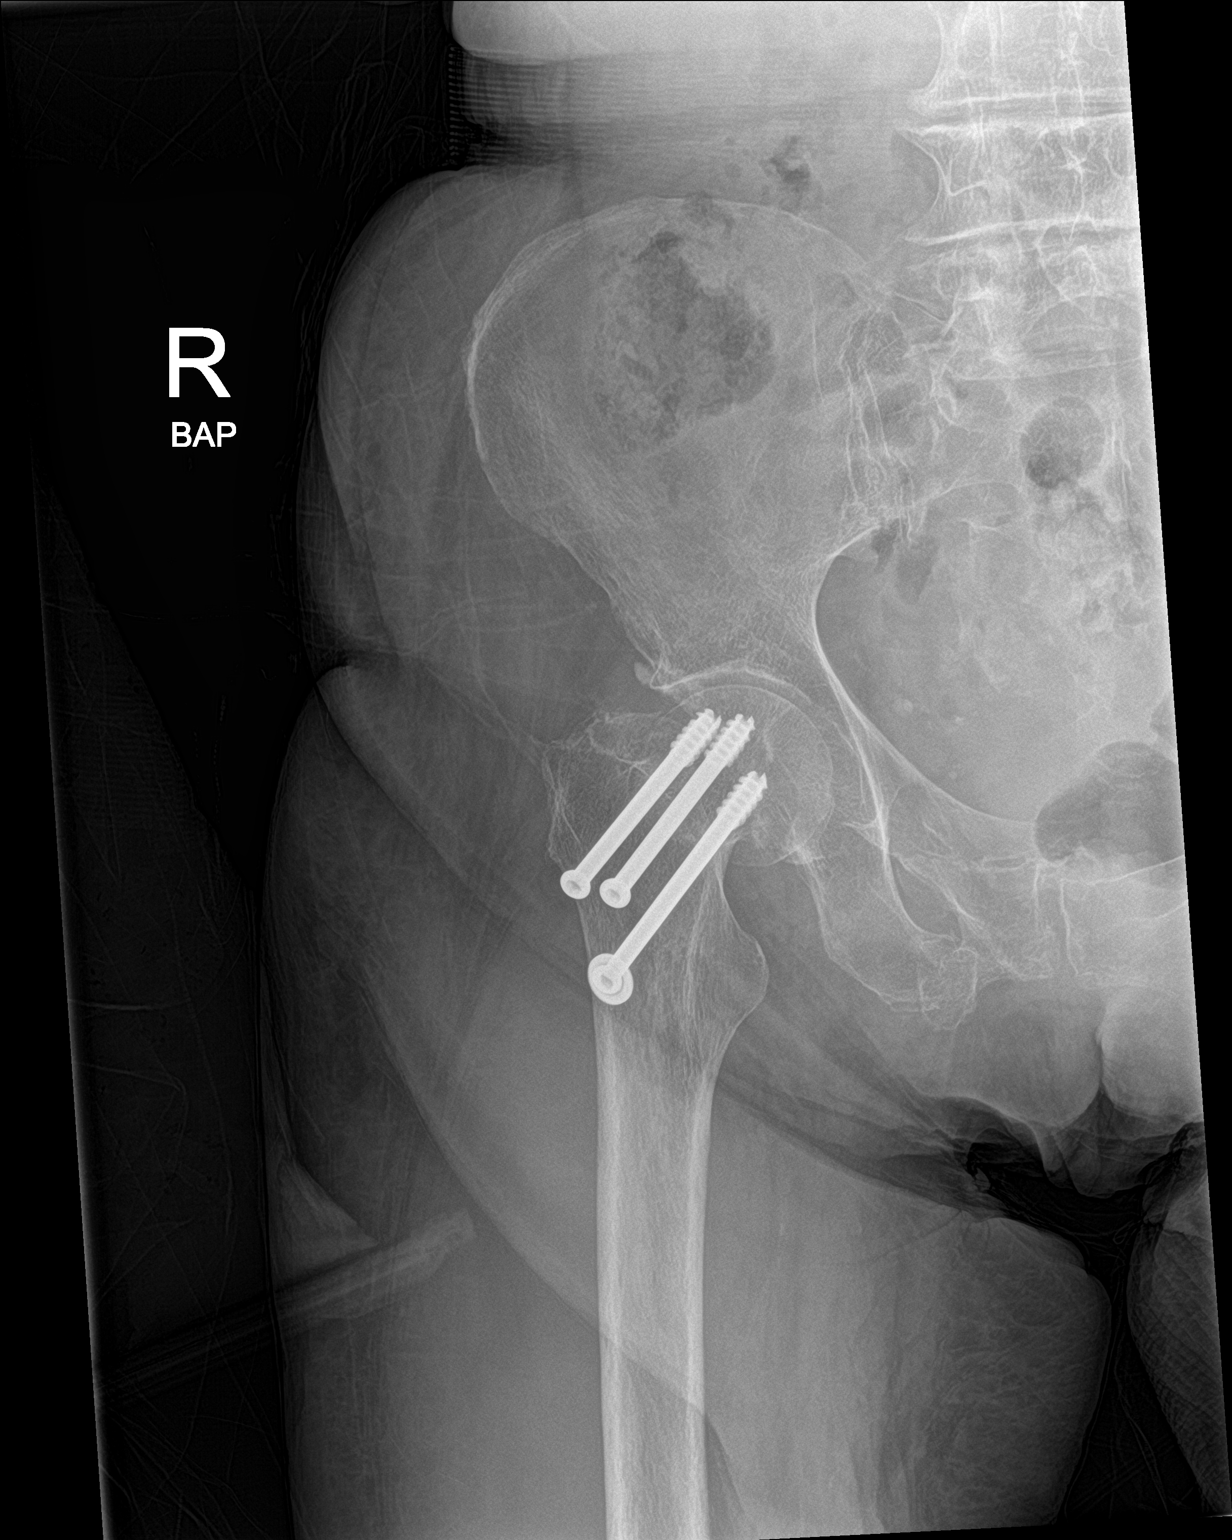

[hip lat]
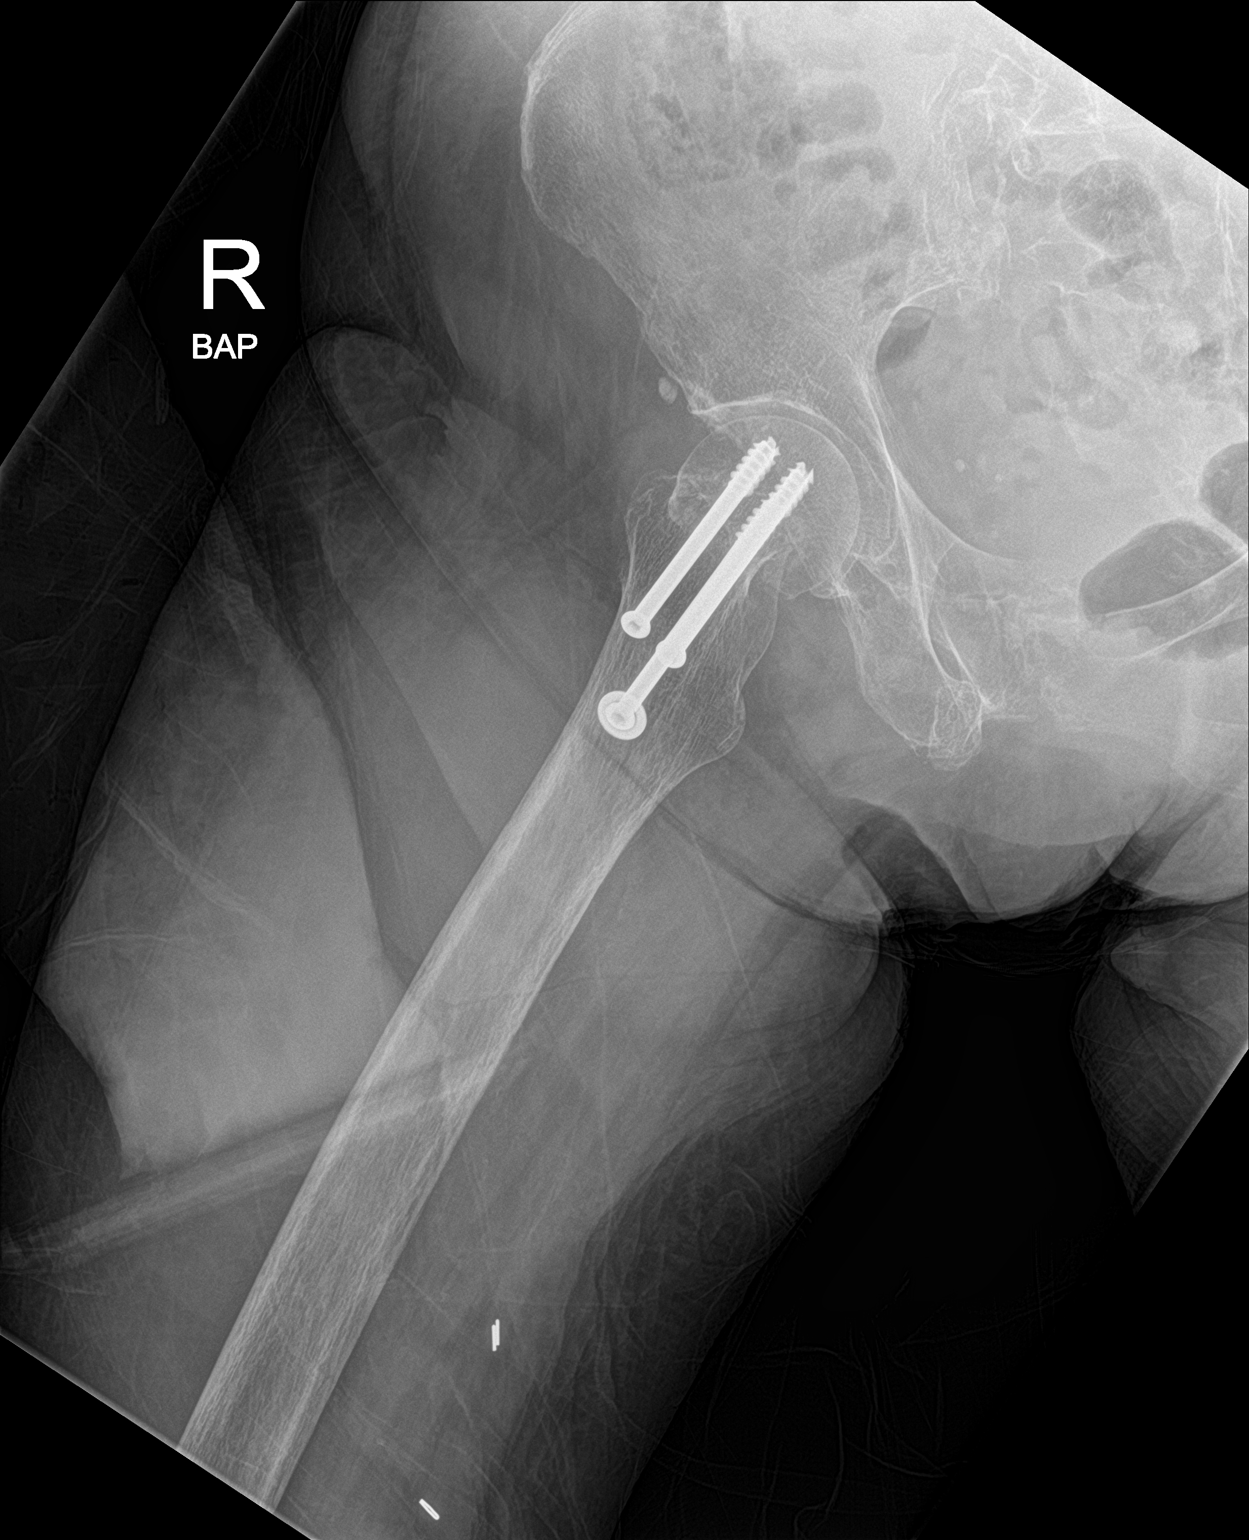

[pelvis ap]
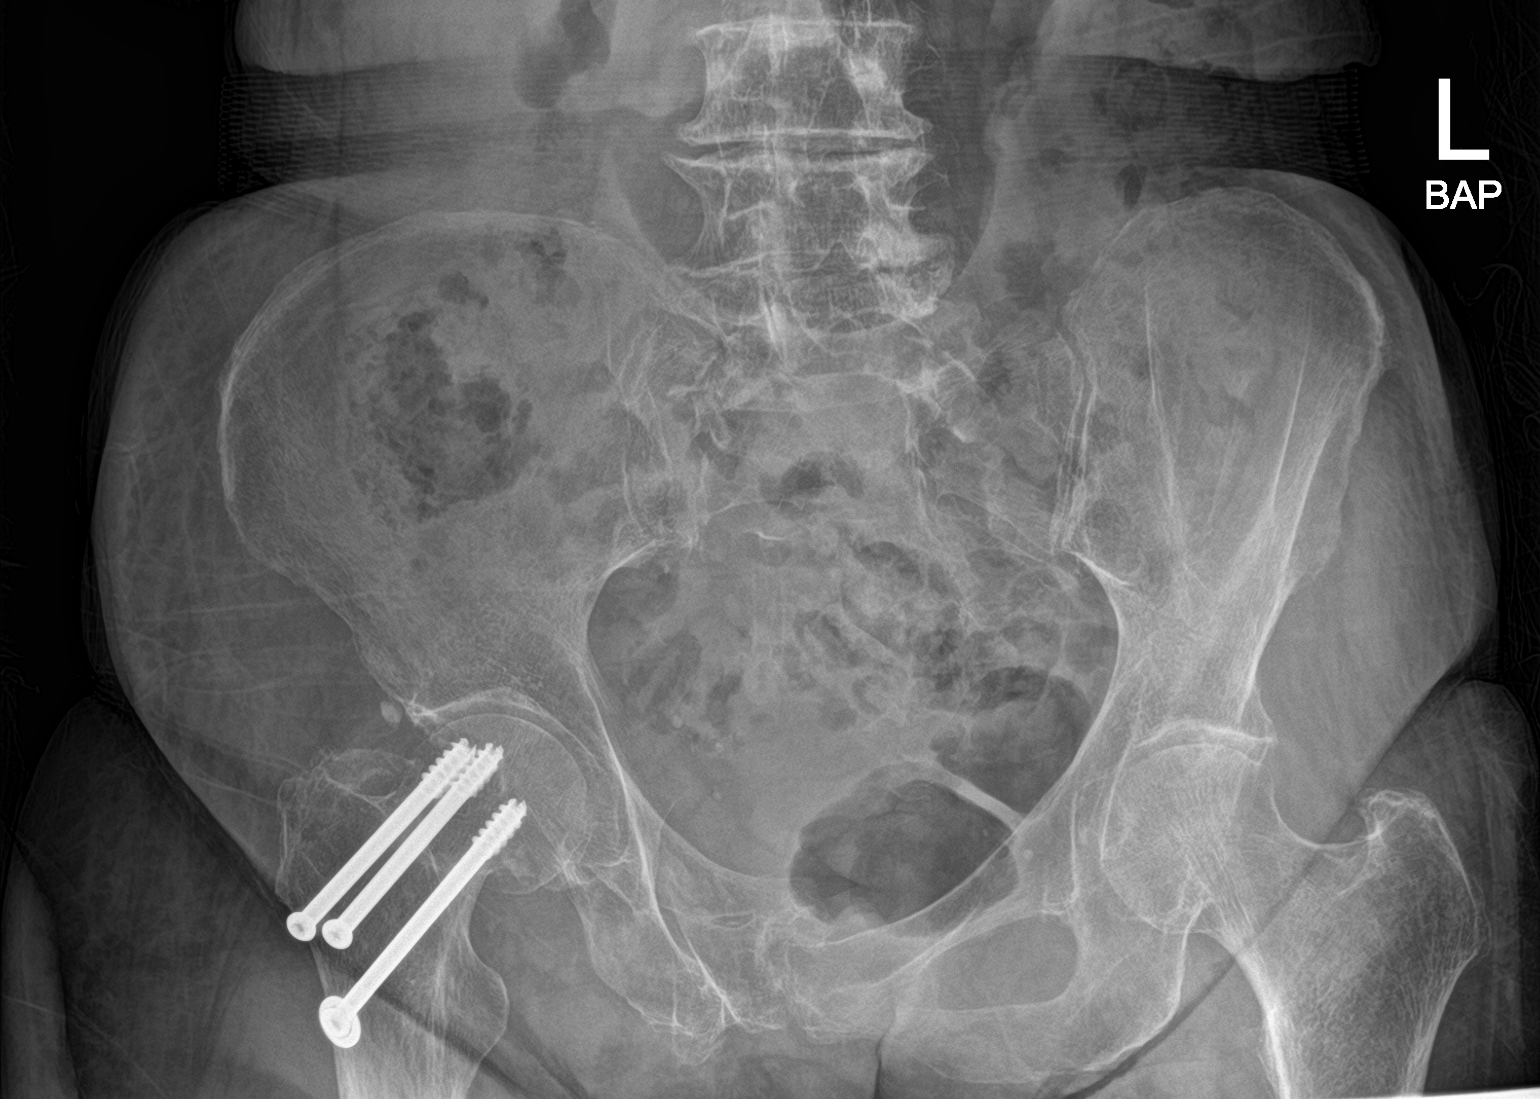

[3 of 3 positions shown; findings below may reference images not displayed]

FINDINGS: There is no change in pinning of the right subcapital femoral
fracture. Deformity of the right pelvic ramus also is stable and
appears old. Disruption of the pubic symphysis is unchanged. Mild
degenerative change of the left hip is noted. The SI joints are
corticated. There are degenerative changes in the lower lumbar
spine.
IMPRESSION: 1. Stable pinning of the right subcapital femoral fracture.
2. Stable old right pelvic rami fracture.

## 2018-12-19 ENCOUNTER — Other Ambulatory Visit: Payer: Self-pay

## 2018-12-19 ENCOUNTER — Encounter (HOSPITAL_COMMUNITY): Payer: Self-pay | Admitting: Physical Therapy

## 2018-12-19 ENCOUNTER — Ambulatory Visit (HOSPITAL_COMMUNITY): Payer: Medicare Other | Attending: Orthopedic Surgery | Admitting: Physical Therapy

## 2018-12-19 DIAGNOSIS — R2689 Other abnormalities of gait and mobility: Secondary | ICD-10-CM | POA: Diagnosis not present

## 2018-12-19 DIAGNOSIS — R29898 Other symptoms and signs involving the musculoskeletal system: Secondary | ICD-10-CM | POA: Diagnosis not present

## 2018-12-19 NOTE — Therapy (Addendum)
[facility] [address] [place], [place], [address] Phone: [phone]   Fax:  [phone]  Physical Therapy Evaluation  Patient Details  Name: [name] MRN: [id number] Date of Birth: [birth year] Referring Provider (PT): Mearl Latin, PA-C   Encounter Date: 12/19/2018  PT End of Session - 12/19/18 1136    Visit Number  1    Number of Visits  7    Date for PT Re-Evaluation  01/30/19   Mini re-assess 01/09/19   Authorization Type  Primary: Medicare; Secondary: BCBS supplement (visits based on medical necessity)    Authorization Time Period  12/19/18 - 01/30/19    Authorization - Visit Number  1    Authorization - Number of Visits  10    PT Start Time  1030    PT Stop Time  1115    PT Time Calculation (min)  45 min    Equipment Utilized During Treatment  Gait belt    Activity Tolerance  Patient tolerated treatment well;No increased pain    Behavior During Therapy  WFL for tasks assessed/performed       Past Medical History:  Diagnosis Date  . Carotid artery stenosis    Without infarction  . Chronic combined systolic (congestive) and diastolic (congestive) heart failure (HCC)   . COPD (chronic obstructive pulmonary disease) (HCC)   . Coronary artery disease   . Heart valve replaced by other means   . Hypercholesterolemia    Pure  . Hypertension    Unspecified  . LBBB (left bundle branch block)   . Macular degeneration (senile) of retina, unspecified   . On home O2   . Postsurgical aortocoronary bypass status   . Stroke (HCC)   . Transient global amnesia   . Unspecified hereditary and idiopathic peripheral neuropathy   . Unspecified vitamin D deficiency     Past Surgical History:  Procedure Laterality Date  . AORTIC VALVE REPLACEMENT    . APPENDECTOMY    . CORONARY ARTERY BYPASS GRAFT    . HIP PINNING,CANNULATED Right 07/18/2018   Procedure: RIGHT CANNULATED HIP PINNING;  Surgeon: Myrene Galas, MD;  Location: [facility] OR;   Service: Orthopedics;  Laterality: Right;  . TONSILLECTOMY      There were no vitals filed for this visit.   Subjective Assessment - 12/19/18 1049    Subjective  Patient stated that she had a hairline fracture in her right femur that she had surgically fixed. She stated that she went to the [facility] twice for a couple weeks. Patient stated that she does have some pain in her right thigh and she feels like her right knee is tight when she tries to bend it at times. Patient denied any tingling or numbness. Patient denied any changes in bowel and bladder function. Patient's daughter stated at the end of the session that patient usually uses oxygen at home and that she usually uses a cane at home and that she will bring both to the next session.     Patient is accompained by:  Family member   Patient's daughter remained in the waiting area   Pertinent History  Right femoral neck fracture right hip OA percutaneous fixation on 07/18/18    Limitations  Standing;Walking;House hold activities    How long can you sit comfortably?  Not limited    How long can you stand comfortably?  2-3 hours    How long can you walk comfortably?  10-15 minutes  Diagnostic tests  X-ray right hip 08/01/18 from chart review: "Stable Pinning of the right subcapital femoral fracture; stable old right pelvic rami fracture"    Patient Stated Goals  Get her right leg stronger    Currently in Pain?  No/denies         [facility] PT Assessment - 12/19/18 0001      Assessment   Medical Diagnosis  Right femoral neck fracture right hip OA    Referring Provider (PT)  Mearl Latin, PA-C    Onset Date/Surgical Date  07/18/18    Next MD Visit  Unsure    Prior Therapy  Yes, at the [facility]   Has the patient fallen in the past 6 months  No    Has the patient had a decrease in activity level because of a fear of falling?   No    Is the patient reluctant to leave their home because of a fear of falling?   No       Home Public house manager residence    Living Arrangements  Alone;Other (Comment)   Woman lives there to help   Type of Home  House    Home Access  Stairs to enter    Entrance Stairs-Number of Steps  2    Entrance Stairs-Rails  Left    Home Layout  One level    Home Equipment  [place] - single point      Prior Function   Level of Independence  Independent with basic ADLs;Independent with household mobility without device    Vocation  Retired      Copy Status  Within Functional Limits for tasks assessed      Observation/Other Assessments   Focus on Therapeutic Outcomes (FOTO)   51% limited      Sensation   Light Touch  Appears Intact      ROM / Strength   AROM / PROM / Strength  Strength;AROM      AROM   AROM Assessment Site  Knee    Right/Left Knee  Right;Left    Right Knee Extension  0    Right Knee Flexion  120    Left Knee Extension  0    Left Knee Flexion  140      Strength   Strength Assessment Site  Hip;Knee;Ankle    Right/Left Hip  Right;Left    Right Hip Flexion  4-/5   Feels pulling   Right Hip Extension  3-/5    Right Hip ABduction  4-/5    Left Hip Flexion  4/5    Left Hip Extension  3-/5    Left Hip ABduction  4/5    Right/Left Knee  Right;Left    Right Knee Flexion  4+/5    Right Knee Extension  4+/5    Left Knee Flexion  4+/5    Left Knee Extension  4+/5    Right/Left Ankle  Right;Left    Right Ankle Dorsiflexion  4+/5    Left Ankle Dorsiflexion  4+/5      Ambulation/Gait   Ambulation/Gait  Yes    Ambulation Distance (Feet)  230 Feet     Assistive device  None    Gait Pattern  Step-through pattern;Decreased step length - left;Decreased stance time - right;Decreased stride length;Decreased hip/knee flexion - right;Right circumduction    Ambulation Surface  Level;Indoor    Gait velocity  0.58  m/s    Gait Comments  Patient frequently reached for railing throughout the 2MWT      Balance    Balance Assessed  Yes      Static Standing Balance   Static Standing - Balance Support  No upper extremity supported    Static Standing Balance -  Activities   Single Leg Stance - Right Leg;Single Leg Stance - Left Leg    Static Standing - Comment/# of Minutes  SLS Lt: 8 seconds; SLS Rt: 3 seconds      Standardized Balance Assessment   Standardized Balance Assessment  Five Times Sit to Stand    Five times sit to stand comments   From standard height chair with bracing upper extremities on thighs: 32.66 seconds                Objective measurements completed on examination: See above findings.              PT Education - 12/19/18 1134    Education Details  Educated patient and patient's daughter on examination findings, plan of care, and to continue HEP given previously, and to bring the cane and oxygen to next session.     Person(s) Educated  Patient;Child(ren);Other (comment)   Patient's daughter   Methods  Explanation    Comprehension  Verbalized understanding       PT Short Term Goals - 12/19/18 1157      PT SHORT TERM GOAL #1   Title  Patient will report understanding and regular compliance with HEP in order to improve strength, balance, and overall functional mobility.     Time  3    Period  Weeks    Status  New    Target Date  01/09/19      PT SHORT TERM GOAL #2   Title  Patient will demonstrate ability to maintain single limb stance for at least 10 seconds on each lower extremity indicating improved stability, balance, and improved safety with stair negotiation.     Time  3    Period  Weeks    Status  New    Target Date  01/09/19        PT Long Term Goals - 12/19/18 1159      PT LONG TERM GOAL #1   Title  Patient will demonstrate improvement of at least 1/2 MMT strength grade in all deficient muscle groups in order to improve ability to perform sit to stands and improve gait mechanics.     Time  6    Period  Weeks    Status  New    Target  Date  01/30/19      PT LONG TERM GOAL #2   Title  Patient will demonstrate ability to perform the 5xSTS test without upper extremities and with an improvement of at least 10 seconds indicating improved functional strength, balance, and safety.     Time  6    Period  Weeks    Status  New    Target Date  01/30/19      PT LONG TERM GOAL #3   Title  Patient will demonstrate right knee flexion AROM within 5 degrees of the left knee flexion AROM in order to have less experienced tightness and pain and to improve mechanics with squatting to get an item off the floor.     Time  6    Period  Weeks    Status  New    Target Date  01/30/19  PT LONG TERM GOAL #4   Title  Patient will demonstrate ability to ambulate at a gait velocity of at least 0.8 m/s with LRAD in order to perform household ambulation with improved safety.     Time  6    Period  Weeks    Status  New    Target Date  01/30/19             Plan - 12/19/18 1208    Clinical Impression Statement  Patient is 85 year old female who presented to outpatient physical therapy following right femoral neck fracture and subsequent percutaneous fixation on 07/18/18. Upon examination, noted decreased lower extremity strength. Noted decreased right knee flexion AROM as well as decreased balance with the 5xSTS test and with single limb stance. With ambulation on the patient demonstrated several gait deviations as well as reached for upper extremity support frequently throughout the test. Patient would benefit from skilled physical therapy in order to address the abovementioned deficits and help patient return to her prior level of function.     History and Personal Factors relevant to plan of care:  Right hip OA and right femoral neck fracture with percutaneous fixation on 07/18/18    Clinical Presentation  Stable    Clinical Presentation due to:  FOTO, 5xSTS, MMT, AROM, , clinical judgement    Clinical Decision Making  Low    Rehab  Potential  Fair    Clinical Impairments Affecting Rehab Potential  Positive: Motivated; Negative: comorbidites    PT Frequency  1x / week    PT Duration  6 weeks    PT Treatment/Interventions  ADLs/Self Care Home Management;Aquatic Therapy;DME Instruction;Gait training;Stair training;Functional mobility training;Therapeutic activities;Therapeutic exercise;Balance training;Neuromuscular re-education;Patient/family education;Moist Heat;Manual techniques;Scar mobilization;Passive range of motion;Dry needling;Energy conservation;Taping    PT Next Visit Plan  Review evaluation, goals, and modify HEP. Use O2 as patient uses at home and monitor SPo2 throughout session. Begin mat exercises for LE strengthening, heel slides to improve right knee AROM, work on sit to stands progressing to lower heights. Balance activities as able. Closed chain exercises per referral.     PT Home Exercise Plan  Review and modify at first treatment session    Consulted and Agree with Plan of Care  Patient;Family member/caregiver    Family Member Consulted  Patient's daughter       Patient will benefit from skilled therapeutic intervention in order to improve the following deficits and impairments:  Abnormal gait, Decreased balance, Decreased endurance, Decreased mobility, Difficulty walking, Decreased range of motion, Improper body mechanics, Decreased activity tolerance, Decreased strength, Pain  Visit Diagnosis: Other abnormalities of gait and mobility  Other symptoms and signs involving the musculoskeletal system     Problem List Patient Active Problem List   Diagnosis Date Noted  . Hematoma of thigh, right, initial encounter 07/23/2018  . Chronic combined systolic and diastolic CHF (congestive heart failure) (HCC) 07/23/2018  . Supratherapeutic INR 07/23/2018  . Acute blood loss anemia 07/23/2018  . CKD (chronic kidney disease) stage 3, GFR 30-59 ml/min (HCC) 07/23/2018  . COPD (chronic obstructive pulmonary  disease) (HCC) 07/23/2018  . Hematoma of right thigh 07/23/2018  . Pressure injury of skin 07/23/2018  . Protein-calorie malnutrition, severe 07/18/2018  . Hip fracture (HCC) 07/16/2018  . COPD exacerbation (HCC) 07/01/2018  . Right hip pain 07/01/2018  . Chronic respiratory failure with hypoxia, on home O2 therapy (HCC) 07/01/2018  . CHF (congestive heart failure) (HCC) 06/28/2018  . Acute diastolic heart failure (  HCC) 06/27/2018  . Anemia 06/27/2018  . Congestive dilated cardiomyopathy (HCC) 09/29/2015  . Encounter for therapeutic drug monitoring 12/12/2013  . Transient confusion 04/16/2013  . Mild malnutrition (HCC) 11/28/2012  . Dehydration 11/27/2012  . Dementia (HCC) 11/27/2012  . Pneumonia 11/24/2012  . Aortic valve disorder 01/13/2011  . Carotid artery stenosis 10/13/2009  . S/P AVR (aortic valve replacement) 10/13/2009  . Hyperlipidemia 03/06/2009  . Essential hypertension 03/06/2009  . CAD, ARTERY BYPASS GRAFT 03/06/2009  . LBBB 03/06/2009  . CVA 03/06/2009   Verne Carrow PT, DPT 12:14 PM, 12/19/18 [phone]  [facility] [address] [place], [place], [address] Phone: [phone]   Fax:  [phone]  Name: [name] MRN: [id number] Date of Birth: [birth year]

## 2018-12-21 DIAGNOSIS — H35721 Serous detachment of retinal pigment epithelium, right eye: Secondary | ICD-10-CM | POA: Diagnosis not present

## 2018-12-21 DIAGNOSIS — H353211 Exudative age-related macular degeneration, right eye, with active choroidal neovascularization: Secondary | ICD-10-CM | POA: Diagnosis not present

## 2018-12-21 DIAGNOSIS — H353112 Nonexudative age-related macular degeneration, right eye, intermediate dry stage: Secondary | ICD-10-CM | POA: Diagnosis not present

## 2018-12-21 DIAGNOSIS — H353221 Exudative age-related macular degeneration, left eye, with active choroidal neovascularization: Secondary | ICD-10-CM | POA: Diagnosis not present

## 2018-12-25 NOTE — Progress Notes (Signed)
HPI: FU coronary artery disease (s/p CABG), prior aortic valve replacement and aortic insufficiency. Last cardiac catheterization in 2011 revealed normal left main; 30% first diagonal, 50% LAD and 60% second diagonal. There was a 40-50% circumflex and a 40% right coronary artery. All grafts were occluded. Medical therapy recommended. Transesophageal echocardiogram in February of 2012 showed an ejection fraction of 45%. There was a St. Jude aortic valve with moderate perivalvular aortic insufficiency. There was moderate mitral regurgitation, moderate left atrial enlargement, mild right atrial enlargement and mild tricuspid regurgitation.Carotid DopplersAugust 2019 showed 1 to 39% bilateral stenosis.Last echo 3/19interpreted as ejection fraction 50 to 55%, trace aortic insufficiency with bioprosthetic aortic valve, moderate to severe left atrial enlargement and mild to moderate pulmonary hypertension. I personally reviewed this echocardiogram and thought ejection fraction was 35%. Since she was last seen,she denies dyspnea, chest pain, palpitations or syncope.  Occasional minimal pedal edema improved with Lasix.  Current Outpatient Medications  Medication Sig Dispense Refill  . acetaminophen (TYLENOL) 325 MG tablet Take 650 mg by mouth every 4 (four) hours as needed.    Marland Kitchen. acetaminophen (TYLENOL) 500 MG tablet Take 1 tablet (500 mg total) by mouth every 8 (eight) hours. 30 tablet 0  . ALPRAZolam (XANAX) 0.25 MG tablet Take 0.25 mg by mouth at bedtime as needed for anxiety.    Marland Kitchen. atorvastatin (LIPITOR) 40 MG tablet Take 1 tablet (40 mg total) by mouth daily. 90 tablet 3  . carvedilol (COREG) 6.25 MG tablet TAKE (1) TABLET BY MOUTH TWICE DAILY. 180 tablet 3  . docusate sodium (COLACE) 100 MG capsule Take 200 mg by mouth at bedtime.    . donepezil (ARICEPT) 10 MG tablet TAKE ONE TABLET BY MOUTH ONCE DAILY. 30 tablet 0  . ergocalciferol (VITAMIN D2) 50000 UNITS capsule Take 50,000 Units by mouth  every Tuesday.     . ferrous sulfate 325 (65 FE) MG EC tablet Take 325 mg by mouth 2 (two) times daily.    . furosemide (LASIX) 20 MG tablet TAKE ONE TABLET DAILY AND TAKE AN EXTRA 1 TABLET AS NEEDED 180 tablet 3  . HYDROcodone-acetaminophen (NORCO/VICODIN) 5-325 MG tablet Take 1-2 tablets by mouth every 6 (six) hours as needed for moderate pain. 60 tablet 0  . hydrocortisone (ANUSOL-HC) 2.5 % rectal cream Place 1 application rectally 3 (three) times daily as needed for hemorrhoids or anal itching.    . levalbuterol (XOPENEX) 0.63 MG/3ML nebulizer solution Take 0.63 mg by nebulization every evening.    . meclizine (ANTIVERT) 25 MG tablet Take 25 mg by mouth 3 (three) times daily as needed for dizziness.    . Melatonin 3 MG TABS Take 1 tablet by mouth at bedtime.    . multivitamin-lutein (OCUVITE-LUTEIN) CAPS capsule Take 1 capsule by mouth daily.    . ondansetron (ZOFRAN) 4 MG tablet Take 4 mg by mouth every 8 (eight) hours as needed for nausea or vomiting.    . polyethylene glycol (MIRALAX / GLYCOLAX) packet Take 17 g by mouth daily as needed.     . pregabalin (LYRICA) 75 MG capsule Take 1 capsule (75 mg total) by mouth every other day. 15 capsule 0  . traMADol (ULTRAM) 50 MG tablet Take 1 tablet (50 mg total) by mouth every 6 (six) hours as needed. 60 tablet 0  . umeclidinium-vilanterol (ANORO ELLIPTA) 62.5-25 MCG/INH AEPB Inhale 1 puff into the lungs daily.     Marland Kitchen. warfarin (COUMADIN) 2.5 MG tablet Take 2.5 mg by mouth every  evening.    . warfarin (COUMADIN) 5 MG tablet TAKE 1/2 TO 1 TABLET ONCE DAILY AS DIRECTED BY COUMADIN CLINIC. 30 tablet 1  . losartan (COZAAR) 25 MG tablet Take 1 tablet (25 mg total) by mouth daily. 90 tablet 3  . spironolactone (ALDACTONE) 25 MG tablet Take 0.5 tablets (12.5 mg total) by mouth daily. 90 tablet 3   No current facility-administered medications for this visit.      Past Medical History:  Diagnosis Date  . Carotid artery stenosis    Without infarction    . Chronic combined systolic (congestive) and diastolic (congestive) heart failure (HCC)   . COPD (chronic obstructive pulmonary disease) (HCC)   . Coronary artery disease   . Heart valve replaced by other means   . Hypercholesterolemia    Pure  . Hypertension    Unspecified  . LBBB (left bundle branch block)   . Macular degeneration (senile) of retina, unspecified   . On home O2   . Postsurgical aortocoronary bypass status   . Stroke (HCC)   . Transient global amnesia   . Unspecified hereditary and idiopathic peripheral neuropathy   . Unspecified vitamin D deficiency     Past Surgical History:  Procedure Laterality Date  . AORTIC VALVE REPLACEMENT    . APPENDECTOMY    . CORONARY ARTERY BYPASS GRAFT    . HIP PINNING,CANNULATED Right 07/18/2018   Procedure: RIGHT CANNULATED HIP PINNING;  Surgeon: Myrene Galas, MD;  Location: [facility] OR;  Service: Orthopedics;  Laterality: Right;  . TONSILLECTOMY      Social History   Socioeconomic History  . Marital status: Widowed    Spouse name: Not on file  . Number of children: 2  . Years of education: 12th  . Highest education level: Not on file  Occupational History    Employer: RETIRED  Social Needs  . Financial resource strain: Not on file  . Food insecurity:    Worry: Not on file    Inability: Not on file  . Transportation needs:    Medical: Not on file    Non-medical: Not on file  Tobacco Use  . Smoking status: Former Smoker    Packs/day: 0.50    Types: Cigarettes    Last attempt to quit: 11/30/1991    Years since quitting: 27.1  . Smokeless tobacco: Never Used  . Tobacco comment: Tobacco use-no  Substance and Sexual Activity  . Alcohol use: No  . Drug use: No  . Sexual activity: Never  Lifestyle  . Physical activity:    Days per week: Not on file    Minutes per session: Not on file  . Stress: Not on file  Relationships  . Social connections:    Talks on phone: Not on file    Gets together: Not on file    Attends  religious service: Not on file    Active member of club or organization: Not on file    Attends meetings of clubs or organizations: Not on file    Relationship status: Not on file  . Intimate partner violence:    Fear of current or ex partner: Not on file    Emotionally abused: Not on file    Physically abused: Not on file    Forced sexual activity: Not on file  Other Topics Concern  . Not on file  Social History Narrative   Pt lives at home alone.   Caffeine Use: very rarely    Family History  Problem Relation  Age of Onset  . Heart attack Father   . Stroke Sister   . Neuropathy Brother   . COPD Sister     ROS: no fevers or chills, productive cough, hemoptysis, dysphasia, odynophagia, melena, hematochezia, dysuria, hematuria, rash, seizure activity, orthopnea, PND, claudication. Remaining systems are negative.  Physical Exam: Well-developed well-nourished in no acute distress.  Skin is warm and dry.  HEENT is normal.  Neck is supple.  Chest is clear to auscultation with normal expansion.  Cardiovascular exam is regular rate and rhythm.  Crisp mechanical valve sound Abdominal exam nontender or distended. No masses palpated. Extremities show no edema. neuro grossly intact  A/P  1 coronary artery disease-patient is not having chest pain.  Continue medical therapy with statin.  Aspirin discontinued previously because of thigh hematoma.  2 prior aortic valve replacement-continue Coumadin at present dose.  We discontinued aspirin as the combination with Coumadin because thigh hematoma.  Continue SBE prophylaxis.  As outlined in previous notes plan conservative measures given patient's age.  3 cardiomyopathy-plan to continue beta-blocker.  Continue low-dose ARB.  We will consider Entresto in the future but systolic is in the low 100s at home.  She also has some dizziness with standing at times.  We are treating conservatively given patient's age. Repeat echo.  4 chronic systolic  congestive heart failure-she appears to be doing well from a volume standpoint.  Continue present dose of Lasix and spironolactone.  Check potassium and renal function.  Continue fluid restriction and low-sodium diet.  5 hypertension-patient's blood pressure is controlled.  Continue present medications and follow.  6 hyperlipidemia-continue statin.  7 carotid artery disease-mild on most recent carotid Dopplers.  [name], MD

## 2018-12-26 ENCOUNTER — Encounter (HOSPITAL_COMMUNITY): Payer: Self-pay | Admitting: Physical Therapy

## 2018-12-26 ENCOUNTER — Ambulatory Visit (HOSPITAL_COMMUNITY): Payer: Medicare Other | Admitting: Physical Therapy

## 2018-12-26 DIAGNOSIS — R2689 Other abnormalities of gait and mobility: Secondary | ICD-10-CM

## 2018-12-26 DIAGNOSIS — R29898 Other symptoms and signs involving the musculoskeletal system: Secondary | ICD-10-CM

## 2018-12-26 NOTE — Therapy (Signed)
[facility] [address] [place], [place], [address] Phone: [phone]   Fax:  [phone]  Physical Therapy Treatment  Patient Details  Name: [name] MRN: [id number] Date of Birth: 1926/04/02 Referring Provider (PT): Mearl Latin, PA-C   Encounter Date: 12/26/2018  PT End of Session - 12/26/18 1119    Visit Number  2    Number of Visits  7    Date for PT Re-Evaluation  01/30/19   Mini re-assess 01/09/19   Authorization Type  Primary: Medicare; Secondary: BCBS supplement (visits based on medical necessity)    Authorization Time Period  12/19/18 - 01/30/19    Authorization - Visit Number  2    Authorization - Number of Visits  10    PT Start Time  1115    PT Stop Time  1156    PT Time Calculation (min)  41 min    Equipment Utilized During Treatment  Gait belt    Activity Tolerance  Patient tolerated treatment well;No increased pain    Behavior During Therapy  WFL for tasks assessed/performed       Past Medical History:  Diagnosis Date  . Carotid artery stenosis    Without infarction  . Chronic combined systolic (congestive) and diastolic (congestive) heart failure (HCC)   . COPD (chronic obstructive pulmonary disease) (HCC)   . Coronary artery disease   . Heart valve replaced by other means   . Hypercholesterolemia    Pure  . Hypertension    Unspecified  . LBBB (left bundle branch block)   . Macular degeneration (senile) of retina, unspecified   . On home O2   . Postsurgical aortocoronary bypass status   . Stroke (HCC)   . Transient global amnesia   . Unspecified hereditary and idiopathic peripheral neuropathy   . Unspecified vitamin D deficiency     Past Surgical History:  Procedure Laterality Date  . AORTIC VALVE REPLACEMENT    . APPENDECTOMY    . CORONARY ARTERY BYPASS GRAFT    . HIP PINNING,CANNULATED Right 07/18/2018   Procedure: RIGHT CANNULATED HIP PINNING;  Surgeon: Myrene Galas, MD;  Location: [facility] OR;   Service: Orthopedics;  Laterality: Right;  . TONSILLECTOMY      There were no vitals filed for this visit.  Subjective Assessment - 12/26/18 1118    Subjective  Patient reported that she is not having any pain currently.     Patient is accompained by:  Family member   Patient's daughter remained in the waiting area   Pertinent History  Right femoral neck fracture right hip OA percutaneous fixation on 07/18/18    Limitations  Standing;Walking;House hold activities    How long can you sit comfortably?  Not limited    How long can you stand comfortably?  2-3 hours    How long can you walk comfortably?  10-15 minutes    Diagnostic tests  X-ray right hip 08/01/18 from chart review: "Stable Pinning of the right subcapital femoral fracture; stable old right pelvic rami fracture"    Patient Stated Goals  Get her right leg stronger    Currently in Pain?  No/denies                       [facility] Adult PT Treatment/Exercise - 12/26/18 0001      Exercises   Exercises  Knee/Hip      Knee/Hip Exercises: Seated   Long Arc Quad  AROM;Strengthening;Right;2 sets;15  reps    Marching  Strengthening;AROM;Right;Left;1 set;Other (comment)   30 repetitions alternating legs     Knee/Hip Exercises: Supine   Quad Sets  Strengthening;Right;Left;2 sets;15 reps    Quad Sets Limitations  5'' holds; semi-reclined for improved breathing    Heel Slides  Strengthening;Right;1 set;15 reps    Heel Slides Limitations  5'' holds. Pillows propped under patient's head for improved breathing    Straight Leg Raises  Strengthening;Right;2 sets;15 reps    Other Supine Knee/Hip Exercises  Hip abduction with verbal cues for form 15x Right LE             PT Education - 12/26/18 1118    Education Details  Educated on HEP.     Person(s) Educated  Patient    Methods  Explanation;Other (comment)   Updated handouts   Comprehension  Verbalized understanding       PT Short Term Goals - 12/26/18 1120       PT SHORT TERM GOAL #1   Title  Patient will report understanding and regular compliance with HEP in order to improve strength, balance, and overall functional mobility.     Time  3    Period  Weeks    Status  On-going    Target Date  01/09/19      PT SHORT TERM GOAL #2   Title  Patient will demonstrate ability to maintain single limb stance for at least 10 seconds on each lower extremity indicating improved stability, balance, and improved safety with stair negotiation.     Time  3    Period  Weeks    Status  On-going    Target Date  01/09/19        PT Long Term Goals - 12/26/18 1120      PT LONG TERM GOAL #1   Title  Patient will demonstrate improvement of at least 1/2 MMT strength grade in all deficient muscle groups in order to improve ability to perform sit to stands and improve gait mechanics.     Time  6    Period  Weeks    Status  On-going      PT LONG TERM GOAL #2   Title  Patient will demonstrate ability to perform the 5xSTS test without upper extremities and with an improvement of at least 10 seconds indicating improved functional strength, balance, and safety.     Time  6    Period  Weeks    Status  On-going      PT LONG TERM GOAL #3   Title  Patient will demonstrate right knee flexion AROM within 5 degrees of the left knee flexion AROM in order to have less experienced tightness and pain and to improve mechanics with squatting to get an item off the floor.     Time  6    Period  Weeks    Status  On-going      PT LONG TERM GOAL #4   Title  Patient will demonstrate ability to ambulate at a gait velocity of at least 0.8 m/s with LRAD in order to perform household ambulation with improved safety.     Time  6    Period  Weeks    Status  On-going            Plan - 12/26/18 1126    Clinical Impression Statement  This session began by reviewing patient's evaluation and goals. Then reviewed patient's previously given HEP and modified the handouts to include  the exercises listed in the plan. Then had the patient perform these exercises and provided cues for improved performance. Educated patient that she should bring in her Oxygen next session in case it was needed. This session patient's SpO2 was found to be between 92-97% throughout, taking breaks when patient's oxygen was below 94%. Patient would benefit from continued skilled physical therapy in order to continue progressing towards functional goals.     Rehab Potential  Fair    Clinical Impairments Affecting Rehab Potential  Positive: Motivated; Negative: comorbidites    PT Frequency  1x / week    PT Duration  6 weeks    PT Treatment/Interventions  ADLs/Self Care Home Management;Aquatic Therapy;DME Instruction;Gait training;Stair training;Functional mobility training;Therapeutic activities;Therapeutic exercise;Balance training;Neuromuscular re-education;Patient/family education;Moist Heat;Manual techniques;Scar mobilization;Passive range of motion;Dry needling;Energy conservation;Taping    PT Next Visit Plan  Use O2 as needed as patient uses at home and monitor SPo2 throughout session. Begin mat exercises for LE strengthening, heel slides to improve right knee AROM, work on sit to stands progressing to lower heights. Progress to standing exercises next session and Balance activities as able.     PT Home Exercise Plan  12/26/18: Quad sets 2x15 5'', Marching seated 2x15; LAQ 2x15; SLR 2x15 all 1x/day; heel slide 15x 2-3x/day; Hip abduction 15x 2-3x/day    Consulted and Agree with Plan of Care  Patient;Family member/caregiver    Family Member Consulted  Patient's daughter       Patient will benefit from skilled therapeutic intervention in order to improve the following deficits and impairments:  Abnormal gait, Decreased balance, Decreased endurance, Decreased mobility, Difficulty walking, Decreased range of motion, Improper body mechanics, Decreased activity tolerance, Decreased strength, Pain  Visit  Diagnosis: Other abnormalities of gait and mobility  Other symptoms and signs involving the musculoskeletal system     Problem List Patient Active Problem List   Diagnosis Date Noted  . Hematoma of thigh, right, initial encounter 07/23/2018  . Chronic combined systolic and diastolic CHF (congestive heart failure) (HCC) 07/23/2018  . Supratherapeutic INR 07/23/2018  . Acute blood loss anemia 07/23/2018  . CKD (chronic kidney disease) stage 3, GFR 30-59 ml/min (HCC) 07/23/2018  . COPD (chronic obstructive pulmonary disease) (HCC) 07/23/2018  . Hematoma of right thigh 07/23/2018  . Pressure injury of skin 07/23/2018  . Protein-calorie malnutrition, severe 07/18/2018  . Hip fracture (HCC) 07/16/2018  . COPD exacerbation (HCC) 07/01/2018  . Right hip pain 07/01/2018  . Chronic respiratory failure with hypoxia, on home O2 therapy (HCC) 07/01/2018  . CHF (congestive heart failure) (HCC) 06/28/2018  . Acute diastolic heart failure (HCC) 06/27/2018  . Anemia 06/27/2018  . Congestive dilated cardiomyopathy (HCC) 09/29/2015  . Encounter for therapeutic drug monitoring 12/12/2013  . Transient confusion 04/16/2013  . Mild malnutrition (HCC) 11/28/2012  . Dehydration 11/27/2012  . Dementia (HCC) 11/27/2012  . Pneumonia 11/24/2012  . Aortic valve disorder 01/13/2011  . Carotid artery stenosis 10/13/2009  . S/P AVR (aortic valve replacement) 10/13/2009  . Hyperlipidemia 03/06/2009  . Essential hypertension 03/06/2009  . CAD, ARTERY BYPASS GRAFT 03/06/2009  . LBBB 03/06/2009  . CVA 03/06/2009   Verne CarrowMacy Judeth Gilles PT, DPT 12:07 PM, 12/26/18 [phone]  [facility] [address] [place], [place], [address] Phone: [phone]   Fax:  [phone]  Name: [name] MRN: [id number] Date of Birth: 01/19/1926

## 2019-01-02 ENCOUNTER — Encounter (HOSPITAL_COMMUNITY): Payer: Self-pay | Admitting: Physical Therapy

## 2019-01-02 ENCOUNTER — Ambulatory Visit (HOSPITAL_COMMUNITY): Payer: Medicare Other | Admitting: Physical Therapy

## 2019-01-02 DIAGNOSIS — R29898 Other symptoms and signs involving the musculoskeletal system: Secondary | ICD-10-CM

## 2019-01-02 DIAGNOSIS — R2689 Other abnormalities of gait and mobility: Secondary | ICD-10-CM | POA: Diagnosis not present

## 2019-01-02 NOTE — Therapy (Addendum)
[facility] [address] [place], [place], [address] Phone: [phone]   Fax:  [phone]  Physical Therapy Treatment  Patient Details  Name: [name] MRN: [id number] Date of Birth: [birth year] Referring Provider (PT): Mearl Latin, PA-C   Encounter Date: 01/02/2019  PT End of Session - 01/02/19 1039    Visit Number  3    Number of Visits  7    Date for PT Re-Evaluation  01/30/19   Mini re-assess 01/09/19   Authorization Type  Primary: Medicare; Secondary: BCBS supplement (visits based on medical necessity)    Authorization Time Period  12/19/18 - 01/30/19    Authorization - Visit Number  3    Authorization - Number of Visits  10    PT Start Time  1031    PT Stop Time  1110    PT Time Calculation (min)  39 min    Equipment Utilized During Treatment  Gait belt;Oxygen;Other (comment)   Oxygen set at setting 3   Activity Tolerance  Patient tolerated treatment well;No increased pain    Behavior During Therapy  WFL for tasks assessed/performed       Past Medical History:  Diagnosis Date  . Carotid artery stenosis    Without infarction  . Chronic combined systolic (congestive) and diastolic (congestive) heart failure (HCC)   . COPD (chronic obstructive pulmonary disease) (HCC)   . Coronary artery disease   . Heart valve replaced by other means   . Hypercholesterolemia    Pure  . Hypertension    Unspecified  . LBBB (left bundle branch block)   . Macular degeneration (senile) of retina, unspecified   . On home O2   . Postsurgical aortocoronary bypass status   . Stroke (HCC)   . Transient global amnesia   . Unspecified hereditary and idiopathic peripheral neuropathy   . Unspecified vitamin D deficiency     Past Surgical History:  Procedure Laterality Date  . AORTIC VALVE REPLACEMENT    . APPENDECTOMY    . CORONARY ARTERY BYPASS GRAFT    . HIP PINNING,CANNULATED Right 07/18/2018   Procedure: RIGHT CANNULATED HIP  PINNING;  Surgeon: Myrene Galas, MD;  Location: [facility] OR;  Service: Orthopedics;  Laterality: Right;  . TONSILLECTOMY      There were no vitals filed for this visit.  Subjective Assessment - 01/02/19 1038    Subjective  Patient reported some soreness, but not pain today.     Patient is accompained by:  Family member   Patient's daughter remained in the waiting area   Pertinent History  Right femoral neck fracture right hip OA percutaneous fixation on 07/18/18    Limitations  Standing;Walking;House hold activities    How long can you sit comfortably?  Not limited    How long can you stand comfortably?  2-3 hours    How long can you walk comfortably?  10-15 minutes    Diagnostic tests  X-ray right hip 08/01/18 from chart review: "Stable Pinning of the right subcapital femoral fracture; stable old right pelvic rami fracture"    Patient Stated Goals  Get her right leg stronger    Currently in Pain?  No/denies                       [facility] Adult PT Treatment/Exercise - 01/02/19 0001      Exercises   Exercises  Knee/Hip      Knee/Hip Exercises: Seated   Long  Arc [employer]  AROM;Strengthening;Right;2 sets;15 Surveyor, mining;Right;Left;1 set;Other (comment)   30 alternating legs     STS 2 x 5 from elevated mat table    Balance Exercises - 01/02/19 1119      Balance Exercises: Standing   Tandem Stance  Eyes open;3 reps;30 secs   Semi-tandem position   Tandem Gait  Forward;Other (comment)   Semi-tandem with intermittent min A for balance 15 feet x 2   Sidestepping  3 reps   Inside // bars 3 roundtrips       PT Education - 01/02/19 1039    Education Details  Discussed purpose and technique of interventions throughout.     Person(s) Educated  Patient    Methods  Explanation    Comprehension  Verbalized understanding       PT Short Term Goals - 12/26/18 1120      PT SHORT TERM GOAL #1   Title  Patient will report understanding and regular  compliance with HEP in order to improve strength, balance, and overall functional mobility.     Time  3    Period  Weeks    Status  On-going    Target Date  01/09/19      PT SHORT TERM GOAL #2   Title  Patient will demonstrate ability to maintain single limb stance for at least 10 seconds on each lower extremity indicating improved stability, balance, and improved safety with stair negotiation.     Time  3    Period  Weeks    Status  On-going    Target Date  01/09/19        PT Long Term Goals - 12/26/18 1120      PT LONG TERM GOAL #1   Title  Patient will demonstrate improvement of at least 1/2 MMT strength grade in all deficient muscle groups in order to improve ability to perform sit to stands and improve gait mechanics.     Time  6    Period  Weeks    Status  On-going      PT LONG TERM GOAL #2   Title  Patient will demonstrate ability to perform the 5xSTS test without upper extremities and with an improvement of at least 10 seconds indicating improved functional strength, balance, and safety.     Time  6    Period  Weeks    Status  On-going      PT LONG TERM GOAL #3   Title  Patient will demonstrate right knee flexion AROM within 5 degrees of the left knee flexion AROM in order to have less experienced tightness and pain and to improve mechanics with squatting to get an item off the floor.     Time  6    Period  Weeks    Status  On-going      PT LONG TERM GOAL #4   Title  Patient will demonstrate ability to ambulate at a gait velocity of at least 0.8 m/s with LRAD in order to perform household ambulation with improved safety.     Time  6    Period  Weeks    Status  On-going            Plan - 01/02/19 1040    Clinical Impression Statement  Upon beginning session, patient's SpO2 was 92%. Patient wore oxygen at setting 3 and patient's SpO2 was measured to be above 94% throughout the rest of the therapy session. This session  added sit to stands from an elevated mat  table to work on improving patient's lower extremity strength as well as added balance exercises this session. Patient tolerated all exercises well. Patient would benefit from continued skilled physical therapy in order to continue progressing towards functional goals.     Rehab Potential  Fair    Clinical Impairments Affecting Rehab Potential  Positive: Motivated; Negative: comorbidites    PT Frequency  1x / week    PT Duration  6 weeks    PT Treatment/Interventions  ADLs/Self Care Home Management;Aquatic Therapy;DME Instruction;Gait training;Stair training;Functional mobility training;Therapeutic activities;Therapeutic exercise;Balance training;Neuromuscular re-education;Patient/family education;Moist Heat;Manual techniques;Scar mobilization;Passive range of motion;Dry needling;Energy conservation;Taping    PT Next Visit Plan  Lower mat table as able. Add functional squats and add to HEP as able. Use O2 as needed as patient uses at home and monitor SPo2 throughout session. Begin mat exercises for LE strengthening, heel slides to improve right knee AROM, work on sit to stands progressing to lower heights. Progress to standing exercises next session and Balance activities as able.     PT Home Exercise Plan  12/26/18: Quad sets 2x15 5'', Marching seated 2x15; LAQ 2x15; SLR 2x15 all 1x/day; heel slide 15x 2-3x/day; Hip abduction 15x 2-3x/day    Consulted and Agree with Plan of Care  Patient;Family member/caregiver    Family Member Consulted  Patient's daughter       Patient will benefit from skilled therapeutic intervention in order to improve the following deficits and impairments:  Abnormal gait, Decreased balance, Decreased endurance, Decreased mobility, Difficulty walking, Decreased range of motion, Improper body mechanics, Decreased activity tolerance, Decreased strength, Pain  Visit Diagnosis: Other abnormalities of gait and mobility  Other symptoms and signs involving the musculoskeletal  system     Problem List Patient Active Problem List   Diagnosis Date Noted  . Hematoma of thigh, right, initial encounter 07/23/2018  . Chronic combined systolic and diastolic CHF (congestive heart failure) (HCC) 07/23/2018  . Supratherapeutic INR 07/23/2018  . Acute blood loss anemia 07/23/2018  . CKD (chronic kidney disease) stage 3, GFR 30-59 ml/min (HCC) 07/23/2018  . COPD (chronic obstructive pulmonary disease) (HCC) 07/23/2018  . Hematoma of right thigh 07/23/2018  . Pressure injury of skin 07/23/2018  . Protein-calorie malnutrition, severe 07/18/2018  . Hip fracture (HCC) 07/16/2018  . COPD exacerbation (HCC) 07/01/2018  . Right hip pain 07/01/2018  . Chronic respiratory failure with hypoxia, on home O2 therapy (HCC) 07/01/2018  . CHF (congestive heart failure) (HCC) 06/28/2018  . Acute diastolic heart failure (HCC) 06/27/2018  . Anemia 06/27/2018  . Congestive dilated cardiomyopathy (HCC) 09/29/2015  . Encounter for therapeutic drug monitoring 12/12/2013  . Transient confusion 04/16/2013  . Mild malnutrition (HCC) 11/28/2012  . Dehydration 11/27/2012  . Dementia (HCC) 11/27/2012  . Pneumonia 11/24/2012  . Aortic valve disorder 01/13/2011  . Carotid artery stenosis 10/13/2009  . S/P AVR (aortic valve replacement) 10/13/2009  . Hyperlipidemia 03/06/2009  . Essential hypertension 03/06/2009  . CAD, ARTERY BYPASS GRAFT 03/06/2009  . LBBB 03/06/2009  . CVA 03/06/2009   Verne CarrowMacy Javares Kaufhold PT, DPT 11:37 AM, 01/02/19 [phone]  [facility] [address] [place], [place], [address] Phone: [phone]   Fax:  [phone]  Name: Josephine CablesRuth E [name] MRN: [id number] Date of Birth: 14-Mar-1926

## 2019-01-08 ENCOUNTER — Encounter: Payer: Self-pay | Admitting: Cardiology

## 2019-01-08 ENCOUNTER — Ambulatory Visit (INDEPENDENT_AMBULATORY_CARE_PROVIDER_SITE_OTHER): Payer: Medicare Other | Admitting: Cardiology

## 2019-01-08 VITALS — BP 122/50 | HR 60 | Ht 65.0 in | Wt 110.2 lb

## 2019-01-08 DIAGNOSIS — I10 Essential (primary) hypertension: Secondary | ICD-10-CM | POA: Diagnosis not present

## 2019-01-08 DIAGNOSIS — I5022 Chronic systolic (congestive) heart failure: Secondary | ICD-10-CM

## 2019-01-08 DIAGNOSIS — I2510 Atherosclerotic heart disease of native coronary artery without angina pectoris: Secondary | ICD-10-CM | POA: Diagnosis not present

## 2019-01-08 DIAGNOSIS — Z952 Presence of prosthetic heart valve: Secondary | ICD-10-CM | POA: Diagnosis not present

## 2019-01-08 NOTE — Patient Instructions (Signed)
Medication Instructions:  NO CHANGE If you need a refill on your cardiac medications before your next appointment, please call your pharmacy.   Lab work: Your physician recommends that you HAVE LAB WORK TODAY If you have labs (blood work) drawn today and your tests are completely normal, you will receive your results only by: . MyChart Message (if you have MyChart) OR . A paper copy in the mail If you have any lab test that is abnormal or we need to change your treatment, we will call you to review the results.  Testing/Procedures: Your physician has requested that you have an echocardiogram. Echocardiography is a painless test that uses sound waves to create images of your heart. It provides your doctor with information about the size and shape of your heart and how well your heart's chambers and valves are working. This procedure takes approximately one hour. There are no restrictions for this procedure.  [address]  Follow-Up: At [facility], you and your health needs are our priority.  As part of our continuing mission to provide you with exceptional heart care, we have created designated Provider Care Teams.  These Care Teams include your primary Cardiologist (physician) and Advanced Practice Providers (APPs -  Physician Assistants and Nurse Practitioners) who all work together to provide you with the care you need, when you need it. You will need a follow up appointment in 6 months.  Please call our office 2 months in advance to schedule this appointment.  You may see Brian Crenshaw, MD or one of the following Advanced Practice Providers on your designated Care Team:   Luke Kilroy, PA-C Krista Kroeger, PA-C . Callie Goodrich, PA-C     

## 2019-01-09 ENCOUNTER — Ambulatory Visit (HOSPITAL_COMMUNITY): Payer: Medicare Other | Admitting: Physical Therapy

## 2019-01-09 ENCOUNTER — Encounter (HOSPITAL_COMMUNITY): Payer: Self-pay | Admitting: Physical Therapy

## 2019-01-09 DIAGNOSIS — R29898 Other symptoms and signs involving the musculoskeletal system: Secondary | ICD-10-CM

## 2019-01-09 DIAGNOSIS — R2689 Other abnormalities of gait and mobility: Secondary | ICD-10-CM

## 2019-01-09 LAB — BASIC METABOLIC PANEL
BUN/Creatinine Ratio: 15 (ref 12–28)
BUN: 15 mg/dL (ref 10–36)
CALCIUM: 9.2 mg/dL (ref 8.7–10.3)
CO2: 28 mmol/L (ref 20–29)
CREATININE: 1.03 mg/dL — AB (ref 0.57–1.00)
Chloride: 103 mmol/L (ref 96–106)
GFR calc Af Amer: 55 mL/min/{1.73_m2} — ABNORMAL LOW (ref >59)
GFR, EST NON AFRICAN AMERICAN: 48 mL/min/{1.73_m2} — AB (ref >59)
GLUCOSE: 66 mg/dL (ref 65–99)
POTASSIUM: 4.5 mmol/L (ref 3.5–5.2)
Sodium: 145 mmol/L — ABNORMAL HIGH (ref 134–144)

## 2019-01-09 NOTE — Patient Instructions (Signed)
Hip Extension: Standing (Single Leg)    In shoulder width stance with red theraband around ankles. Pull leg back, keeping knee nearly straight. Repeat 10__ times per set. Repeat with other leg. Do _1_ sets per session. Do _7_ sessions per week.  http://tub.exer.us/194   Copyright  VHI. All rights reserved.  ABDUCTION: Standing (Active)    Stand, feet flat. Lift right leg out to side. Use red theraband around ankles Complete _1__ sets of _10__ repetitions. Perform __1_ sessions per day.  http://gtsc.exer.us/111   Copyright  VHI. All rights reserved.

## 2019-01-09 NOTE — Therapy (Signed)
[place], [place], [address] Phone: [phone]   Fax:  [phone]  Physical Therapy Treatment / Progress Note  Patient Details  Name: [name] MRN: [id number] Date of Birth: April 25, 1926 Referring Provider (PT): Jari Pigg, PA-C   Encounter Date: 01/09/2019   Progress Note Reporting Period 12/19/18 to 01/09/19  See note below for Objective Data and Assessment of Progress/Goals.       PT End of Session - 01/09/19 1055    Visit Number  4    Number of Visits  7    Date for PT Re-Evaluation  01/30/19   Mini re-assess 01/09/19   Authorization Type  Primary: Medicare; Secondary: BCBS supplement (visits based on medical necessity)    Authorization Time Period  12/19/18 - 01/30/19    Authorization - Visit Number  4    Authorization - Number of Visits  10    PT Start Time  1030    PT Stop Time  1115    PT Time Calculation (min)  45 min    Equipment Utilized During Treatment  Gait belt    Activity Tolerance  Patient tolerated treatment well;No increased pain    Behavior During Therapy  WFL for tasks assessed/performed       Past Medical History:  Diagnosis Date  . Carotid artery stenosis    Without infarction  . Chronic combined systolic (congestive) and diastolic (congestive) heart failure ([place])   . COPD (chronic obstructive pulmonary disease) ([place])   . Coronary artery disease   . Heart valve replaced by other means   . Hypercholesterolemia    Pure  . Hypertension    Unspecified  . LBBB (left bundle branch block)   . Macular degeneration (senile) of retina, unspecified   . On home O2   . Postsurgical aortocoronary bypass status   . Stroke ([place])   . Transient global amnesia   . Unspecified hereditary and idiopathic peripheral neuropathy   . Unspecified vitamin D deficiency     Past Surgical History:  Procedure Laterality Date  . AORTIC VALVE REPLACEMENT    . APPENDECTOMY    . CORONARY  ARTERY BYPASS GRAFT    . HIP PINNING,CANNULATED Right 07/18/2018   Procedure: RIGHT CANNULATED HIP PINNING;  Surgeon: Altamese [place], MD;  Location: [facility];  Service: Orthopedics;  Laterality: Right;  . TONSILLECTOMY      There were no vitals filed for this visit.  Subjective Assessment - 01/09/19 1126    Subjective  Patient denied pain currently. She stated she has been doing her exercises at home.     Patient is accompained by:  Family member   Patient's daughter remained in the waiting area   Pertinent History  Right femoral neck fracture right hip OA percutaneous fixation on 07/18/18    Limitations  Standing;Walking;House hold activities    How long can you sit comfortably?  Not limited    How long can you stand comfortably?  2-3 hours    How long can you walk comfortably?  10-15 minutes    Diagnostic tests  X-ray right hip 08/01/18 from chart review: "Stable Pinning of the right subcapital femoral fracture; stable old right pelvic rami fracture"    Patient Stated Goals  Get her right leg stronger    Currently in Pain?  No/denies         [facility] PT Assessment - 01/09/19 0001      Observation/Other Assessments   Focus  on Therapeutic Outcomes (FOTO)   50% limited      AROM   Right Knee Flexion  130   was 120     Strength   Right Hip Flexion  4/5   Feels a pull. was 4-   Right Hip Extension  3-/5   was 3-   Right Hip ABduction  4/5   was 4-   Left Hip Flexion  4+/5   was 4   Left Hip Extension  3/5   was 3-   Left Hip ABduction  4/5   was 4   Right Knee Flexion  4+/5   was 4+   Right Knee Extension  4+/5   was 4+   Left Knee Flexion  4+/5   was 4+   Left Knee Extension  4+/5   was 4+   Right Ankle Dorsiflexion  4+/5   was 4+   Left Ankle Dorsiflexion  4+/5   was 4+     Ambulation/Gait   Ambulation/Gait  Yes    Ambulation Distance (Feet)  231 Feet   2MWT, was 230   Assistive device  None    Gait Pattern  Step-through pattern;Decreased step length -  left;Decreased stance time - right;Decreased stride length;Decreased hip/knee flexion - right;Right circumduction    Ambulation Surface  Level;Indoor    Gait velocity  0.59 m/s    Gait Comments  Patient frequently reached for railing throughout the 2MWT      Static Standing Balance   Static Standing - Balance Support  No upper extremity supported    Static Standing Balance -  Activities   Single Leg Stance - Right Leg;Single Leg Stance - Left Leg    Static Standing - Comment/# of Minutes  SLS Lt: 5 seconds; Right: 4 seonds      Standardized Balance Assessment   Standardized Balance Assessment  Five Times Sit to Stand    Five times sit to stand comments   From standard height chair with bracing upper extremities on thighs: 22.98 seconds                   [facility] Adult PT Treatment/Exercise - 01/09/19 0001      Knee/Hip Exercises: Standing   Hip Abduction  Right;Stengthening;Left;1 set;10 reps;Knee straight    Abduction Limitations  with RTB    Hip Extension  Stengthening;Right;Left;1 set;10 reps    Extension Limitations  With RTB      Knee/Hip Exercises: Seated   Sit to Sand  2 sets;5 reps;without UE support   From 23-inch mat         Balance Exercises - 01/09/19 1101      Balance Exercises: Standing   Sidestepping  3 reps;Theraband   inside // bars 3 roundtrips with RTB around ankles       PT Education - 01/09/19 1127    Education Details  Educated on updated HEP.     Person(s) Educated  Patient    Methods  Explanation;Handout    Comprehension  Verbalized understanding       PT Short Term Goals - 01/09/19 1119      PT SHORT TERM GOAL #1   Title  Patient will report understanding and regular compliance with HEP in order to improve strength, balance, and overall functional mobility.     Time  3    Period  Weeks    Status  Achieved    Target Date  01/09/19      PT SHORT TERM  GOAL #2   Title  Patient will demonstrate ability to maintain single limb  stance for at least 10 seconds on each lower extremity indicating improved stability, balance, and improved safety with stair negotiation.     Time  3    Period  Weeks    Status  On-going    Target Date  01/09/19        PT Long Term Goals - 01/09/19 1119      PT LONG TERM GOAL #1   Title  Patient will demonstrate improvement of at least 1/2 MMT strength grade in all deficient muscle groups in order to improve ability to perform sit to stands and improve gait mechanics.     Baseline  01/09/19: Patient achieved in some, but not all muscle groups; see MMT    Time  6    Period  Weeks    Status  Partially Met      PT LONG TERM GOAL #2   Title  Patient will demonstrate ability to perform the 5xSTS test without upper extremities and with an improvement of at least 10 seconds indicating improved functional strength, balance, and safety.     Baseline  01/09/19: Patient required upper extremity assistance although with decreased time this session    Time  6    Period  Weeks    Status  On-going      PT LONG TERM GOAL #3   Title  Patient will demonstrate right knee flexion AROM within 5 degrees of the left knee flexion AROM in order to have less experienced tightness and pain and to improve mechanics with squatting to get an item off the floor.     Baseline  01/09/19: See AROM    Time  6    Period  Weeks    Status  On-going      PT LONG TERM GOAL #4   Title  Patient will demonstrate ability to ambulate at a gait velocity of at least 0.8 m/s with LRAD in order to perform household ambulation with improved safety.     Baseline  01/09/19: See objective measures    Time  6    Period  Weeks    Status  On-going            Plan - 01/09/19 1124    Clinical Impression Statement  This session performed a re-assessment of patient's progress towards goals as patient is halfway through plan of care. Patient achieved 1 out of 2 short term goals. Patient partially met 1 out of 4 long term goals.  Remainder of session, updated patient's home exercise program and worked on exercises to improve patient's lower extremity strength and balance. Patient would continue to benefit from skilled physical therapy in order to continue progressing towards functional goals. Patient's SpO2 was at or above 94% throughout.     Rehab Potential  Fair    Clinical Impairments Affecting Rehab Potential  Positive: Motivated; Negative: comorbidites    PT Frequency  1x / week    PT Duration  6 weeks    PT Treatment/Interventions  ADLs/Self Care Home Management;Aquatic Therapy;DME Instruction;Gait training;Stair training;Functional mobility training;Therapeutic activities;Therapeutic exercise;Balance training;Neuromuscular re-education;Patient/family education;Moist Heat;Manual techniques;Scar mobilization;Passive range of motion;Dry needling;Energy conservation;Taping    PT Next Visit Plan  Lower mat table as able. Add functional squats and add to HEP as able. Use O2 as needed as patient uses at home and monitor SPo2 throughout session. Begin mat exercises for LE strengthening, heel slides to improve right  knee AROM, work on sit to stands progressing to lower heights. Progress to standing exercises next session and Balance activities as able.     PT Home Exercise Plan  12/26/18: Quad sets 2x15 5'', Marching seated 2x15; LAQ 2x15; SLR 2x15 all 1x/day; heel slide 15x 2-3x/day; Hip abduction 15x 2-3x/day    Consulted and Agree with Plan of Care  Patient;Family member/caregiver    Family Member Consulted  Patient's daughter       Patient will benefit from skilled therapeutic intervention in order to improve the following deficits and impairments:  Abnormal gait, Decreased balance, Decreased endurance, Decreased mobility, Difficulty walking, Decreased range of motion, Improper body mechanics, Decreased activity tolerance, Decreased strength, Pain  Visit Diagnosis: Other abnormalities of gait and mobility  Other symptoms  and signs involving the musculoskeletal system     Problem List Patient Active Problem List   Diagnosis Date Noted  . Hematoma of thigh, right, initial encounter 07/23/2018  . Chronic combined systolic and diastolic CHF (congestive heart failure) ([place]) 07/23/2018  . Supratherapeutic INR 07/23/2018  . Acute blood loss anemia 07/23/2018  . CKD (chronic kidney disease) stage 3, GFR 30-59 ml/min (HCC) 07/23/2018  . COPD (chronic obstructive pulmonary disease) ([place]) 07/23/2018  . Hematoma of right thigh 07/23/2018  . Pressure injury of skin 07/23/2018  . Protein-calorie malnutrition, severe 07/18/2018  . Hip fracture ([place]) 07/16/2018  . COPD exacerbation ([place]) 07/01/2018  . Right hip pain 07/01/2018  . Chronic respiratory failure with hypoxia, on home O2 therapy ([place]) 07/01/2018  . CHF (congestive heart failure) ([place]) 06/28/2018  . Acute diastolic heart failure ([place]) 06/27/2018  . Anemia 06/27/2018  . Congestive dilated cardiomyopathy ([place]) 09/29/2015  . Encounter for therapeutic drug monitoring 12/12/2013  . Transient confusion 04/16/2013  . Mild malnutrition ([place]) 11/28/2012  . Dehydration 11/27/2012  . Dementia ([place]) 11/27/2012  . Pneumonia 11/24/2012  . Aortic valve disorder 01/13/2011  . Carotid artery stenosis 10/13/2009  . S/P AVR (aortic valve replacement) 10/13/2009  . Hyperlipidemia 03/06/2009  . Essential hypertension 03/06/2009  . CAD, ARTERY BYPASS GRAFT 03/06/2009  . LBBB 03/06/2009  . CVA 03/06/2009   Clarene Critchley PT, DPT 11:29 AM, 01/09/19 [place], [place], [address] Phone: [phone]   Fax:  [phone]  Name: ALIVEA GLADSON MRN: [id number] Date of Birth: 1926/11/05

## 2019-01-10 ENCOUNTER — Encounter: Payer: Self-pay | Admitting: *Deleted

## 2019-01-11 ENCOUNTER — Ambulatory Visit (INDEPENDENT_AMBULATORY_CARE_PROVIDER_SITE_OTHER): Payer: Medicare Other | Admitting: *Deleted

## 2019-01-11 DIAGNOSIS — Z952 Presence of prosthetic heart valve: Secondary | ICD-10-CM

## 2019-01-11 DIAGNOSIS — Z5181 Encounter for therapeutic drug level monitoring: Secondary | ICD-10-CM | POA: Diagnosis not present

## 2019-01-11 DIAGNOSIS — I359 Nonrheumatic aortic valve disorder, unspecified: Secondary | ICD-10-CM

## 2019-01-11 LAB — POCT INR: INR: 2.7 (ref 2.0–3.0)

## 2019-01-11 NOTE — Patient Instructions (Signed)
Description   Continue on same dosage 1/2 tablet daily except 1 tablet on Tuesdays, Thursdays and Saturdays.  Repeat INR in 4 weeks.  [facility] [phone]

## 2019-01-16 DIAGNOSIS — J9611 Chronic respiratory failure with hypoxia: Secondary | ICD-10-CM | POA: Diagnosis not present

## 2019-01-16 DIAGNOSIS — J449 Chronic obstructive pulmonary disease, unspecified: Secondary | ICD-10-CM | POA: Diagnosis not present

## 2019-01-16 DIAGNOSIS — I5020 Unspecified systolic (congestive) heart failure: Secondary | ICD-10-CM | POA: Diagnosis not present

## 2019-01-16 DIAGNOSIS — G629 Polyneuropathy, unspecified: Secondary | ICD-10-CM | POA: Diagnosis not present

## 2019-01-17 DIAGNOSIS — M1611 Unilateral primary osteoarthritis, right hip: Secondary | ICD-10-CM | POA: Diagnosis not present

## 2019-01-17 DIAGNOSIS — S72001D Fracture of unspecified part of neck of right femur, subsequent encounter for closed fracture with routine healing: Secondary | ICD-10-CM | POA: Diagnosis not present

## 2019-01-17 DIAGNOSIS — R2689 Other abnormalities of gait and mobility: Secondary | ICD-10-CM | POA: Diagnosis not present

## 2019-01-22 ENCOUNTER — Ambulatory Visit (HOSPITAL_COMMUNITY): Payer: Medicare Other | Attending: Cardiology

## 2019-01-22 DIAGNOSIS — Z952 Presence of prosthetic heart valve: Secondary | ICD-10-CM

## 2019-01-23 ENCOUNTER — Encounter (HOSPITAL_COMMUNITY): Payer: Self-pay | Admitting: Physical Therapy

## 2019-01-23 ENCOUNTER — Ambulatory Visit (HOSPITAL_COMMUNITY): Payer: Medicare Other | Attending: Orthopedic Surgery | Admitting: Physical Therapy

## 2019-01-23 DIAGNOSIS — R29898 Other symptoms and signs involving the musculoskeletal system: Secondary | ICD-10-CM | POA: Insufficient documentation

## 2019-01-23 DIAGNOSIS — R2689 Other abnormalities of gait and mobility: Secondary | ICD-10-CM | POA: Diagnosis not present

## 2019-01-23 NOTE — Therapy (Signed)
[place], [place], [address] Phone: [phone]   Fax:  [phone]  Physical Therapy Treatment  Patient Details  Name: [name] MRN: [id number] Date of Birth: [birth year] Referring Provider (PT): Jari Pigg, PA-C   Encounter Date: 01/23/2019  PT End of Session - 01/23/19 1129    Visit Number  5    Number of Visits  7    Date for PT Re-Evaluation  01/30/19   Mini re-assess 01/09/19   Authorization Type  Primary: Medicare; Secondary: BCBS supplement (visits based on medical necessity)    Authorization Time Period  12/19/18 - 01/30/19    Authorization - Visit Number  5    Authorization - Number of Visits  10    PT Start Time  2482    PT Stop Time  1114    PT Time Calculation (min)  42 min    Equipment Utilized During Treatment  Gait belt    Activity Tolerance  Patient tolerated treatment well;No increased pain    Behavior During Therapy  WFL for tasks assessed/performed       Past Medical History:  Diagnosis Date  . Carotid artery stenosis    Without infarction  . Chronic combined systolic (congestive) and diastolic (congestive) heart failure ([place])   . COPD (chronic obstructive pulmonary disease) ([place])   . Coronary artery disease   . Heart valve replaced by other means   . Hypercholesterolemia    Pure  . Hypertension    Unspecified  . LBBB (left bundle branch block)   . Macular degeneration (senile) of retina, unspecified   . On home O2   . Postsurgical aortocoronary bypass status   . Stroke ([place])   . Transient global amnesia   . Unspecified hereditary and idiopathic peripheral neuropathy   . Unspecified vitamin D deficiency     Past Surgical History:  Procedure Laterality Date  . AORTIC VALVE REPLACEMENT    . APPENDECTOMY    . CORONARY ARTERY BYPASS GRAFT    . HIP PINNING,CANNULATED Right 07/18/2018   Procedure: RIGHT CANNULATED HIP PINNING;  Surgeon: Altamese [place], MD;  Location: [facility];   Service: Orthopedics;  Laterality: Right;  . TONSILLECTOMY      There were no vitals filed for this visit.  Subjective Assessment - 01/23/19 1035    Subjective  Patient denied any pain, but reported some soreness.     Patient is accompained by:  Family member   Patient's daughter remained in the waiting area   Pertinent History  Right femoral neck fracture right hip OA percutaneous fixation on 07/18/18    Limitations  Standing;Walking;House hold activities    How long can you sit comfortably?  Not limited    How long can you stand comfortably?  2-3 hours    How long can you walk comfortably?  10-15 minutes    Diagnostic tests  X-ray right hip 08/01/18 from chart review: "Stable Pinning of the right subcapital femoral fracture; stable old right pelvic rami fracture"    Patient Stated Goals  Get her right leg stronger    Currently in Pain?  No/denies                       [facility] Adult PT Treatment/Exercise - 01/23/19 0001      Knee/Hip Exercises: Standing   Hip Abduction  Right;Stengthening;Left;1 set;10 reps;Knee straight    Abduction Limitations  with RTB with verbal and tactile  cues    Hip Extension  Stengthening;Right;Left;1 set;10 reps    Extension Limitations  With RTB    Functional Squat  1 set;15 reps    Functional Squat Limitations  Bilateral Upper extremity support, with verbal cues and tactile cues for form    SLS with Vectors  Forward, side, and back 3'' x5 each    Other Standing Knee Exercises  Tandem ambulation 15' x 2 with intermittent upper extremity assistance      Knee/Hip Exercises: Seated   Long Arc Quad  Strengthening;Right;Left;1 set;15 reps;Weights    Long Arc Quad Weight  3 lbs.   pounds   Other Seated Knee/Hip Exercises  Seated marching over 6'' hurdle 2 x 10 each LE    Sit to Sand  2 sets;5 reps;without UE support   From 23-inch mat              PT Short Term Goals - 01/09/19 1119      PT SHORT TERM GOAL #1   Title  Patient  will report understanding and regular compliance with HEP in order to improve strength, balance, and overall functional mobility.     Time  3    Period  Weeks    Status  Achieved    Target Date  01/09/19      PT SHORT TERM GOAL #2   Title  Patient will demonstrate ability to maintain single limb stance for at least 10 seconds on each lower extremity indicating improved stability, balance, and improved safety with stair negotiation.     Time  3    Period  Weeks    Status  On-going    Target Date  01/09/19        PT Long Term Goals - 01/09/19 1119      PT LONG TERM GOAL #1   Title  Patient will demonstrate improvement of at least 1/2 MMT strength grade in all deficient muscle groups in order to improve ability to perform sit to stands and improve gait mechanics.     Baseline  01/09/19: Patient achieved in some, but not all muscle groups; see MMT    Time  6    Period  Weeks    Status  Partially Met      PT LONG TERM GOAL #2   Title  Patient will demonstrate ability to perform the 5xSTS test without upper extremities and with an improvement of at least 10 seconds indicating improved functional strength, balance, and safety.     Baseline  01/09/19: Patient required upper extremity assistance although with decreased time this session    Time  6    Period  Weeks    Status  On-going      PT LONG TERM GOAL #3   Title  Patient will demonstrate right knee flexion AROM within 5 degrees of the left knee flexion AROM in order to have less experienced tightness and pain and to improve mechanics with squatting to get an item off the floor.     Baseline  01/09/19: See AROM    Time  6    Period  Weeks    Status  On-going      PT LONG TERM GOAL #4   Title  Patient will demonstrate ability to ambulate at a gait velocity of at least 0.8 m/s with LRAD in order to perform household ambulation with improved safety.     Baseline  01/09/19: See objective measures    Time  6  Period  Weeks    Status   On-going            Plan - 01/23/19 1129    Clinical Impression Statement  Patient's SpO2 was monitored throughout the session and found to be between 97 and 98% throughout session. This session continued to progress patient with lower extremity strengthening and balance activities. This session added ankle weights to long arc quads. Also, progressed patient with balance with single limb vector stance and tandem ambulation. This session, patient also performed seated marching over a 6'' hurdle to improve hip flexion strength. Plan to re-assess patient at next session.     Rehab Potential  Fair    Clinical Impairments Affecting Rehab Potential  Positive: Motivated; Negative: comorbidites    PT Frequency  1x / week    PT Duration  6 weeks    PT Treatment/Interventions  ADLs/Self Care Home Management;Aquatic Therapy;DME Instruction;Gait training;Stair training;Functional mobility training;Therapeutic activities;Therapeutic exercise;Balance training;Neuromuscular re-education;Patient/family education;Moist Heat;Manual techniques;Scar mobilization;Passive range of motion;Dry needling;Energy conservation;Taping    PT Next Visit Plan  Lower mat table as able. Add functional squats and add to HEP as able. Use O2 as needed as patient uses at home and monitor SPo2 throughout session. Begin mat exercises for LE strengthening, heel slides to improve right knee AROM, work on sit to stands progressing to lower heights. Progress to standing exercises next session and Balance activities as able.     PT Home Exercise Plan  12/26/18: Quad sets 2x15 5'', Marching seated 2x15; LAQ 2x15; SLR 2x15 all 1x/day; heel slide 15x 2-3x/day; Hip abduction 15x 2-3x/day; 01/23/19: Functional squat with upper extremity assistance x 15 at countertop    Consulted and Agree with Plan of Care  Patient;Family member/caregiver    Family Member Consulted  Patient's daughter       Patient will benefit from skilled therapeutic  intervention in order to improve the following deficits and impairments:  Abnormal gait, Decreased balance, Decreased endurance, Decreased mobility, Difficulty walking, Decreased range of motion, Improper body mechanics, Decreased activity tolerance, Decreased strength, Pain  Visit Diagnosis: Other abnormalities of gait and mobility  Other symptoms and signs involving the musculoskeletal system     Problem List Patient Active Problem List   Diagnosis Date Noted  . Hematoma of thigh, right, initial encounter 07/23/2018  . Chronic combined systolic and diastolic CHF (congestive heart failure) ([place]) 07/23/2018  . Supratherapeutic INR 07/23/2018  . Acute blood loss anemia 07/23/2018  . CKD (chronic kidney disease) stage 3, GFR 30-59 ml/min (H[facility]) 07/23/2018  . COPD (chronic obstructive pulmonary disease) ([place]) 07/23/2018  . Hematoma of right thigh 07/23/2018  . Pressure injury of skin 07/23/2018  . Protein-calorie malnutrition, severe 07/18/2018  . Hip fracture ([place]) 07/16/2018  . COPD exacerbation ([place]) 07/01/2018  . Right hip pain 07/01/2018  . Chronic respiratory failure with hypoxia, on home O2 therapy ([place]) 07/01/2018  . CHF (congestive heart failure) ([place]) 06/28/2018  . Acute diastolic heart failure ([place]) 06/27/2018  . Anemia 06/27/2018  . Congestive dilated cardiomyopathy ([place]) 09/29/2015  . Encounter for therapeutic drug monitoring 12/12/2013  . Transient confusion 04/16/2013  . Mild malnutrition ([place]) 11/28/2012  . Dehydration 11/27/2012  . Dementia ([place]) 11/27/2012  . Pneumonia 11/24/2012  . Aortic valve disorder 01/13/2011  . Carotid artery stenosis 10/13/2009  . S/P AVR (aortic valve replacement) 10/13/2009  . Hyperlipidemia 03/06/2009  . Essential hypertension 03/06/2009  . CAD, ARTERY BYPASS GRAFT 03/06/2009  . LBBB 03/06/2009  . CVA 03/06/2009   Otho Ket  Michelle Piper, DPT 11:31 AM, 01/23/19 [place], [place], [address] Phone: [phone]   Fax:  [phone]  Name: [name] MRN: [id number] Date of Birth: [birth year]

## 2019-01-23 NOTE — Patient Instructions (Signed)
(  Home) Squat: (Assist)    Using supports, arms close to body, squat by dropping hips back as if sitting in a chair. Perform at a countertop with arm assistance.  Repeat _15___ times per set. Do __1__ sets per session. Do ___7_ sessions per week.  Copyright  VHI. All rights reserved.

## 2019-01-30 ENCOUNTER — Other Ambulatory Visit: Payer: Self-pay

## 2019-01-30 ENCOUNTER — Ambulatory Visit (HOSPITAL_COMMUNITY): Payer: Medicare Other | Admitting: Physical Therapy

## 2019-01-30 ENCOUNTER — Encounter (HOSPITAL_COMMUNITY): Payer: Self-pay | Admitting: Physical Therapy

## 2019-01-30 DIAGNOSIS — R29898 Other symptoms and signs involving the musculoskeletal system: Secondary | ICD-10-CM

## 2019-01-30 DIAGNOSIS — R2689 Other abnormalities of gait and mobility: Secondary | ICD-10-CM | POA: Diagnosis not present

## 2019-01-30 NOTE — Therapy (Signed)
[place], [place], [address] Phone: [phone]   Fax:  [phone]  Physical Therapy Treatment / Progress Note / Discharge Summary  Patient Details  Name: [name] MRN: [id number] Date of Birth: 10/28/1926 Referring Provider (PT): Jari Pigg, PA-C   Encounter Date: 01/30/2019   Progress Note Reporting Period 01/30/19 to 12/19/18  See note below for Objective Data and Assessment of Progress/Goals.       PHYSICAL THERAPY DISCHARGE SUMMARY  Visits from Start of Care: 6  Current functional level related to goals / functional outcomes: See below   Remaining deficits: See below   Education / Equipment: Updated HEP, educated on using a cane Plan: Patient agrees to discharge.  Patient goals were partially met. Patient is being discharged due to                                                     ?????  Meeting the majority of her goals and being independent with her HEP at this time.          PT End of Session - 01/30/19 1033    Visit Number  6    Number of Visits  7    Date for PT Re-Evaluation  01/30/19   Mini re-assess 01/09/19   Authorization Type  Primary: Medicare; Secondary: BCBS supplement (visits based on medical necessity)    Authorization Time Period  12/19/18 - 01/30/19    Authorization - Visit Number  6    Authorization - Number of Visits  10    PT Start Time  1031    PT Stop Time  1105    PT Time Calculation (min)  34 min    Equipment Utilized During Treatment  Gait belt    Activity Tolerance  Patient tolerated treatment well;No increased pain    Behavior During Therapy  WFL for tasks assessed/performed       Past Medical History:  Diagnosis Date  . Carotid artery stenosis    Without infarction  . Chronic combined systolic (congestive) and diastolic (congestive) heart failure ([place])   . COPD (chronic obstructive pulmonary disease) ([place])   . Coronary artery disease   .  Heart valve replaced by other means   . Hypercholesterolemia    Pure  . Hypertension    Unspecified  . LBBB (left bundle branch block)   . Macular degeneration (senile) of retina, unspecified   . On home O2   . Postsurgical aortocoronary bypass status   . Stroke ([place])   . Transient global amnesia   . Unspecified hereditary and idiopathic peripheral neuropathy   . Unspecified vitamin D deficiency     Past Surgical History:  Procedure Laterality Date  . AORTIC VALVE REPLACEMENT    . APPENDECTOMY    . CORONARY ARTERY BYPASS GRAFT    . HIP PINNING,CANNULATED Right 07/18/2018   Procedure: RIGHT CANNULATED HIP PINNING;  Surgeon: Altamese [place], MD;  Location: [facility];  Service: Orthopedics;  Laterality: Right;  . TONSILLECTOMY      There were no vitals filed for this visit.  Subjective Assessment - 01/30/19 1336    Subjective  Patient denied any pain. She stated she feels she can continue her exercises at home independently.     Patient is accompained by:  Family  member   Patient's daughter remained in the waiting area   Pertinent History  Right femoral neck fracture right hip OA percutaneous fixation on 07/18/18    Limitations  Standing;Walking;House hold activities    How long can you sit comfortably?  Not limited    How long can you stand comfortably?  2-3 hours    How long can you walk comfortably?  10-15 minutes    Diagnostic tests  X-ray right hip 08/01/18 from chart review: "Stable Pinning of the right subcapital femoral fracture; stable old right pelvic rami fracture"    Patient Stated Goals  Get her right leg stronger    Currently in Pain?  No/denies         [facility] PT Assessment - 01/30/19 0001      Observation/Other Assessments   Focus on Therapeutic Outcomes (FOTO)   42% limited      AROM   Right Knee Flexion  135   was 130   Left Knee Flexion  140      Strength   Right Hip Flexion  4+/5   was 4-   Right Hip Extension  3+/5   was 3-   Right Hip ABduction  4+/5    was 4   Left Hip Flexion  4+/5   was 4+   Left Hip Extension  3+/5   was 3   Left Hip ABduction  4+/5   was 4   Right Knee Flexion  5/5   was 4+   Right Knee Extension  5/5   was 4+   Left Knee Flexion  5/5   was 4+   Left Knee Extension  5/5   was 4+   Right Ankle Dorsiflexion  5/5   was 4+   Left Ankle Dorsiflexion  5/5   was 4+     Ambulation/Gait   Ambulation/Gait  Yes    Ambulation Distance (Feet)  300 Feet    Assistive device  None    Gait Pattern  Step-through pattern;Decreased step length - left;Decreased stance time - right;Decreased stride length;Decreased hip/knee flexion - right;Right circumduction    Ambulation Surface  Level;Indoor    Gait velocity  0.77 m/s    Gait Comments  Patient does not reach for railing as frequently, but shows uneven step pattern at times      Static Standing Balance   Static Standing - Balance Support  No upper extremity supported    Static Standing Balance -  Activities   Single Leg Stance - Right Leg;Single Leg Stance - Left Leg    Static Standing - Comment/# of Minutes  SLS: Left - 2 seconds; Right - 1 second                           PT Education - 01/30/19 1337    Education Details  Educated on updated HEP, plan to discharge, and for patient to continue using a cane when ambulating.     Person(s) Educated  Patient;Child(ren)    Methods  Explanation;Handout    Comprehension  Verbalized understanding       PT Short Term Goals - 01/30/19 1316      PT SHORT TERM GOAL #1   Title  Patient will report understanding and regular compliance with HEP in order to improve strength, balance, and overall functional mobility.     Time  3    Period  Weeks    Status  Achieved  Target Date  01/09/19      PT SHORT TERM GOAL #2   Title  Patient will demonstrate ability to maintain single limb stance for at least 10 seconds on each lower extremity indicating improved stability, balance, and improved safety with stair  negotiation.     Baseline  See objective measures    Time  3    Period  Weeks    Status  On-going    Target Date  01/09/19        PT Long Term Goals - 01/30/19 1320      PT LONG TERM GOAL #1   Title  Patient will demonstrate improvement of at least 1/2 MMT strength grade in all deficient muscle groups in order to improve ability to perform sit to stands and improve gait mechanics.     Time  6    Period  Weeks    Status  Achieved      PT LONG TERM GOAL #2   Title  Patient will demonstrate ability to perform the 5xSTS test without upper extremities and with an improvement of at least 10 seconds indicating improved functional strength, balance, and safety.     Baseline  01/09/19: Patient required upper extremity assistance although with decreased time this session    Time  6    Period  Weeks    Status  Unable to assess      PT LONG TERM GOAL #3   Title  Patient will demonstrate right knee flexion AROM within 5 degrees of the left knee flexion AROM in order to have less experienced tightness and pain and to improve mechanics with squatting to get an item off the floor.     Baseline  01/30/19: See AROM    Time  6    Period  Weeks    Status  Achieved      PT LONG TERM GOAL #4   Title  Patient will demonstrate ability to ambulate at a gait velocity of at least 0.8 m/s with LRAD in order to perform household ambulation with improved safety.     Baseline  01/30/19: Patient ambualated at 0.77 m/s     Time  6    Period  Weeks    Status  Partially Met            Plan - 01/30/19 1333    Clinical Impression Statement  Performed a re-assessment of patient's progress towards goals this session. Patient achieved 1 out of 2 short term goals and patient achieved 2 out of 4 long term goals and partially met 1 out of 4 long term goals. Patient has made good progress in therapy. She has demonstrated good improvements in strength, knee ROM, and ambulation. Patient continues to demonstrate some  deficits in balance and gait, however, patient is independent with her HEP at this time and feel that patient would be able to safely continue exercises at home to address remaining goals. Patient's SpO2 was monitored throughout the session and found to be between 96-97%. Therapist also recommended that patient use a cane when ambulating for safety and explained this to the patient and the patient's daughter. Patient is being discharged at this time as she has met many of her goals and may continue progressing with exercises at home.     Rehab Potential  Fair    Clinical Impairments Affecting Rehab Potential  Positive: Motivated; Negative: comorbidites    PT Frequency  1x / week    PT Duration  6 weeks    PT Treatment/Interventions  ADLs/Self Care Home Management;Aquatic Therapy;DME Instruction;Gait training;Stair training;Functional mobility training;Therapeutic activities;Therapeutic exercise;Balance training;Neuromuscular re-education;Patient/family education;Moist Heat;Manual techniques;Scar mobilization;Passive range of motion;Dry needling;Energy conservation;Taping    PT Next Visit Plan  Discharged    PT Home Exercise Plan  12/26/18: Quad sets 2x15 5'', Marching seated 2x15; LAQ 2x15; SLR 2x15 all 1x/day; heel slide 15x 2-3x/day; Hip abduction 15x 2-3x/day; 01/23/19: Functional squat with upper extremity assistance x 15 at countertop    Consulted and Agree with Plan of Care  Patient;Family member/caregiver    Family Member Consulted  Patient's daughter       Patient will benefit from skilled therapeutic intervention in order to improve the following deficits and impairments:  Abnormal gait, Decreased balance, Decreased endurance, Decreased mobility, Difficulty walking, Decreased range of motion, Improper body mechanics, Decreased activity tolerance, Decreased strength, Pain  Visit Diagnosis: Other abnormalities of gait and mobility  Other symptoms and signs involving the musculoskeletal  system     Problem List Patient Active Problem List   Diagnosis Date Noted  . Hematoma of thigh, right, initial encounter 07/23/2018  . Chronic combined systolic and diastolic CHF (congestive heart failure) ([place]) 07/23/2018  . Supratherapeutic INR 07/23/2018  . Acute blood loss anemia 07/23/2018  . CKD (chronic kidney disease) stage 3, GFR 30-59 ml/min (H[facility]) 07/23/2018  . COPD (chronic obstructive pulmonary disease) ([place]) 07/23/2018  . Hematoma of right thigh 07/23/2018  . Pressure injury of skin 07/23/2018  . Protein-calorie malnutrition, severe 07/18/2018  . Hip fracture ([place]) 07/16/2018  . COPD exacerbation ([place]) 07/01/2018  . Right hip pain 07/01/2018  . Chronic respiratory failure with hypoxia, on home O2 therapy ([place]) 07/01/2018  . CHF (congestive heart failure) ([place]) 06/28/2018  . Acute diastolic heart failure ([place]) 06/27/2018  . Anemia 06/27/2018  . Congestive dilated cardiomyopathy ([place]) 09/29/2015  . Encounter for therapeutic drug monitoring 12/12/2013  . Transient confusion 04/16/2013  . Mild malnutrition ([place]) 11/28/2012  . Dehydration 11/27/2012  . Dementia ([place]) 11/27/2012  . Pneumonia 11/24/2012  . Aortic valve disorder 01/13/2011  . Carotid artery stenosis 10/13/2009  . S/P AVR (aortic valve replacement) 10/13/2009  . Hyperlipidemia 03/06/2009  . Essential hypertension 03/06/2009  . CAD, ARTERY BYPASS GRAFT 03/06/2009  . LBBB 03/06/2009  . CVA 03/06/2009   Clarene Critchley PT, DPT 1:38 PM, 01/30/19 [place], [place], [address] Phone: [phone]   Fax:  [phone]  Name: [name] MRN: [id number] Date of Birth: 1926-04-28

## 2019-01-30 NOTE — Patient Instructions (Addendum)
Single Leg - Eyes Open    Holding support, lift right leg while maintaining balance over other leg. Progress to removing hands from support surface for longer periods of time. Hold_10___ seconds. Repeat _10___ times per session. Do _1___ sessions per day.  Copyright  VHI. All rights reserved.  EXTENSION: Standing (Active)    Stand, both feet flat. Draw right leg behind body as far as possible. With red theraband around ankles  Complete __1_ sets of __10_ repetitions. Perform _1__ sessions per day.  http://gtsc.exer.us/77   Copyright  VHI. All rights reserved.

## 2019-02-01 DIAGNOSIS — H353122 Nonexudative age-related macular degeneration, left eye, intermediate dry stage: Secondary | ICD-10-CM | POA: Diagnosis not present

## 2019-02-01 DIAGNOSIS — H353221 Exudative age-related macular degeneration, left eye, with active choroidal neovascularization: Secondary | ICD-10-CM | POA: Diagnosis not present

## 2019-02-06 ENCOUNTER — Telehealth: Payer: Self-pay

## 2019-02-06 NOTE — Telephone Encounter (Signed)
LMOM FOR PRESCREEN AND DRIVE THRU 

## 2019-02-08 ENCOUNTER — Other Ambulatory Visit: Payer: Self-pay

## 2019-02-08 ENCOUNTER — Ambulatory Visit (INDEPENDENT_AMBULATORY_CARE_PROVIDER_SITE_OTHER): Payer: Medicare Other | Admitting: Pharmacist

## 2019-02-08 DIAGNOSIS — Z952 Presence of prosthetic heart valve: Secondary | ICD-10-CM | POA: Diagnosis not present

## 2019-02-08 DIAGNOSIS — I359 Nonrheumatic aortic valve disorder, unspecified: Secondary | ICD-10-CM | POA: Diagnosis not present

## 2019-02-08 DIAGNOSIS — Z5181 Encounter for therapeutic drug level monitoring: Secondary | ICD-10-CM | POA: Diagnosis not present

## 2019-02-08 LAB — POCT INR: INR: 3.3 — AB (ref 2.0–3.0)

## 2019-03-08 ENCOUNTER — Other Ambulatory Visit: Payer: Self-pay | Admitting: *Deleted

## 2019-03-08 NOTE — Patient Outreach (Signed)
[facility]) Care Management  03/08/2019  TEIGHAN AUBERT Feb 29, 1926 [id number]   Initial [facility] screening call   Referral source: Insurance plan Referral reason : tier 5, multiple comorbidity PCP : Dr. Luan Pulling   Outreach call to patient explained reason for the call, HIPAA confirmed x 2 identifiers. Patient discussed doing good,on today, resting in home with her daughter and caregiver present . Patient requested that I speak with her daughter [name], patient daughter discussed being familiar with [facility] care manager services RN and pharmacist after right femur surgery, daughter discussed that patient is very alert with a sound mind..   Patient/daughter further discussed: Social Patient lives at home, she has a 24 hour caregiver that lives with her and able to assist as needed. Patient discussed being independent with her bathing and dressing she has a walk in shower. Patient necessary medical equipment in home  Conditions  Heart Failure  Patient discussed weighing daily, recording daily weights on a calendar , reports taking extra fluid pill is she goes up a pound, current 106. Patient/daughter  reports being familiar with heart failure zones , and worsening symptoms of shortness of breath and swelling to notify MD of  COPD Patient wears oxygen at 4 liters around the clock and takes inhaler and nebulizer treatments as prescribed, she denies worsening shortness of breath cough. Monitors oxygen saturation in home, greater than 95% reading.  Hypertension  Reports blood pressure control and monitors daily , reading in 90-100/ range she has been instructed to call MD for readings less than 90, denies recent reading in this range.  History of valve replacement  Follows up with MD office for INR monitoring  History of falls No recent falls , uses cane/walker for use .    Medications  Patient daughter fills pill Environmental education officer . She denies current cost concerns regarding  medications, discussed [facility] pharmacist previous assistance due to cost of Anora inhaler , she reports patient has not met the out of pocket spend amount for this year yet. She denies current concerns regarding medication cost at this time .  Advanced Directive Daughter reports patient has a signed DNR form in home .   Consent  Explained and offered [facility] care management services, no  care coordination needs identified, explained and offered health coach telephonic program that patient is appropriate for and agreeable to.   Plan Will send  successful outreach letter  Will place referral for health coach telephonic follow up management  for chronic conditions of COPD and heart failure .    Joylene Draft, RN, [place] Management Coordinator  [phone]- Mobile [phone]- Toll Free Main Office

## 2019-03-12 ENCOUNTER — Telehealth: Payer: Self-pay | Admitting: Pharmacist

## 2019-03-12 NOTE — Telephone Encounter (Signed)

## 2019-03-13 DIAGNOSIS — H353221 Exudative age-related macular degeneration, left eye, with active choroidal neovascularization: Secondary | ICD-10-CM | POA: Diagnosis not present

## 2019-03-13 DIAGNOSIS — H353212 Exudative age-related macular degeneration, right eye, with inactive choroidal neovascularization: Secondary | ICD-10-CM | POA: Diagnosis not present

## 2019-03-13 DIAGNOSIS — H353112 Nonexudative age-related macular degeneration, right eye, intermediate dry stage: Secondary | ICD-10-CM | POA: Diagnosis not present

## 2019-03-13 DIAGNOSIS — H35721 Serous detachment of retinal pigment epithelium, right eye: Secondary | ICD-10-CM | POA: Diagnosis not present

## 2019-03-14 ENCOUNTER — Other Ambulatory Visit: Payer: Self-pay

## 2019-03-14 ENCOUNTER — Ambulatory Visit (INDEPENDENT_AMBULATORY_CARE_PROVIDER_SITE_OTHER): Payer: Medicare Other

## 2019-03-14 DIAGNOSIS — Z5181 Encounter for therapeutic drug level monitoring: Secondary | ICD-10-CM

## 2019-03-14 DIAGNOSIS — Z952 Presence of prosthetic heart valve: Secondary | ICD-10-CM

## 2019-03-14 DIAGNOSIS — I359 Nonrheumatic aortic valve disorder, unspecified: Secondary | ICD-10-CM

## 2019-03-14 LAB — POCT INR: INR: 4.7 — AB (ref 2.0–3.0)

## 2019-03-14 NOTE — Patient Instructions (Signed)
Description   Called spoke with pt's daughter advised to skip today and tomorrow's dosage of Coumadin, then start taking 1/2 tablet daily except 1 tablet on Tuesdays and Saturdays.  Repeat INR in 2 weeks.  [facility]ou[facility]in Clinic [phone]

## 2019-03-20 ENCOUNTER — Telehealth: Payer: Self-pay | Admitting: Cardiology

## 2019-03-20 DIAGNOSIS — J9611 Chronic respiratory failure with hypoxia: Secondary | ICD-10-CM | POA: Diagnosis not present

## 2019-03-20 DIAGNOSIS — I5020 Unspecified systolic (congestive) heart failure: Secondary | ICD-10-CM | POA: Diagnosis not present

## 2019-03-20 DIAGNOSIS — I10 Essential (primary) hypertension: Secondary | ICD-10-CM | POA: Diagnosis not present

## 2019-03-20 DIAGNOSIS — D649 Anemia, unspecified: Secondary | ICD-10-CM | POA: Diagnosis not present

## 2019-03-20 NOTE — Telephone Encounter (Signed)
New  Message             Dr Kari Baars is calling and would like a a quick call back from Dr. Jens Som, patient is waitings in the office now [phone] Kriste Basque is the contact person.

## 2019-03-20 NOTE — Telephone Encounter (Signed)
Contacted by Dr. [name].  Patient was in the office and was noted to have low blood pressures in the 90s that was symptomatic.  We have therefore discontinued her ARB and they will follow her blood pressure at home and we will make further adjustments as needed. [name]

## 2019-03-26 ENCOUNTER — Ambulatory Visit: Payer: Self-pay

## 2019-03-27 ENCOUNTER — Telehealth: Payer: Self-pay

## 2019-03-27 NOTE — Telephone Encounter (Signed)

## 2019-03-28 ENCOUNTER — Other Ambulatory Visit: Payer: Self-pay

## 2019-03-28 ENCOUNTER — Ambulatory Visit (INDEPENDENT_AMBULATORY_CARE_PROVIDER_SITE_OTHER): Payer: Medicare Other

## 2019-03-28 ENCOUNTER — Other Ambulatory Visit: Payer: Self-pay | Admitting: Cardiology

## 2019-03-28 DIAGNOSIS — I359 Nonrheumatic aortic valve disorder, unspecified: Secondary | ICD-10-CM

## 2019-03-28 DIAGNOSIS — Z952 Presence of prosthetic heart valve: Secondary | ICD-10-CM

## 2019-03-28 DIAGNOSIS — Z5181 Encounter for therapeutic drug level monitoring: Secondary | ICD-10-CM | POA: Diagnosis not present

## 2019-03-28 LAB — POCT INR: INR: 2.5 (ref 2.0–3.0)

## 2019-03-28 NOTE — Patient Instructions (Signed)
Description   Called spoke with pt's daughter advised to continue on same dosage 1/2 tablet daily except 1 tablet on Tuesdays and Saturdays.  Repeat INR in 3 weeks.  Coumadin Clinic [phone]

## 2019-03-28 NOTE — Patient Outreach (Signed)
[facility]) Care Management  03/28/2019  [name] 1925/11/26 [id number]    1st outreach placed to the patient for initial assessment.  HIPAA provided by the patient.  Introduced myself to the patient and explained my role with [facility].  The patient was very receptive to the call but felt it best that I speak with her daughter [name] to complete the assessment.    Called [name]'s number but no answer.  HIPAA compliant voicemail left with contact information.  [name] RN, BSN, [name] RN Health Coach Disease Management [facility] Dial:  [phone]  Fax: [phone]

## 2019-03-30 DIAGNOSIS — I5020 Unspecified systolic (congestive) heart failure: Secondary | ICD-10-CM | POA: Diagnosis not present

## 2019-03-30 DIAGNOSIS — J449 Chronic obstructive pulmonary disease, unspecified: Secondary | ICD-10-CM | POA: Diagnosis not present

## 2019-03-30 DIAGNOSIS — F419 Anxiety disorder, unspecified: Secondary | ICD-10-CM | POA: Diagnosis not present

## 2019-03-30 DIAGNOSIS — J9611 Chronic respiratory failure with hypoxia: Secondary | ICD-10-CM | POA: Diagnosis not present

## 2019-04-03 ENCOUNTER — Other Ambulatory Visit: Payer: Self-pay

## 2019-04-03 ENCOUNTER — Telehealth: Payer: Self-pay | Admitting: Cardiology

## 2019-04-03 NOTE — Progress Notes (Signed)
Virtual Visit via phone Note   This visit type was conducted due to national recommendations for restrictions regarding the COVID-19 Pandemic (e.g. social distancing) in an effort to limit this patient's exposure and mitigate transmission in our community.  Due to her co-morbid illnesses, this patient is at least at moderate risk for complications without adequate follow up.  This format is felt to be most appropriate for this patient at this time.  The patient did not have access to video technology/had technical difficulties with video requiring transitioning to audio format only (telephone).  All issues noted in this document were discussed and addressed.  No physical exam could be performed with this format.  Please refer to the patient's chart for her  consent to telehealth for [facility].   Date:  04/05/2019   ID:  [name], DOB [birth year], MRN [id number]  Patient Location: Home Provider Location: Home  PCP:  Kari Baars, MD  Cardiologist:  [name], MD   Evaluation Performed:  Follow-Up Visit  Chief Complaint:  FU CHF  History of Present Illness:    FU coronary artery disease (s/p CABG), prior aortic valve replacement and aortic insufficiency. Last cardiac catheterization in 2011 revealed normal left main; 30% first diagonal, 50% LAD and 60% second diagonal. There was a 40-50% circumflex and a 40% right coronary artery. All grafts were occluded. Medical therapy recommended. Transesophageal echocardiogram in February of 2012 showed an ejection fraction of 45%. There was a [employer] Jude aortic valve with moderate perivalvular aortic insufficiency. There was moderate mitral regurgitation, moderate left atrial enlargement, mild right atrial enlargement and mild tricuspid regurgitation.Carotid DopplersAugust 2019 showed 1 to 39% bilateral stenosis.Last echo March 2020 showed ejection fraction 25 to 30%, mild left ventricular enlargement, biatrial enlargement, moderate  mitral regurgitation, moderate aortic insufficiency status post aortic valve replacement with mean gradient 15 mmHg.  Since she was last seen,there is no dyspnea, chest pain, palpitations or syncope.  No pedal edema.  Her blood pressure has been running low and her losartan was discontinued and her dry weight was increased by 2 pounds.  Blood pressure now improved.  The patient does not have symptoms concerning for COVID-19 infection (fever, chills, cough, or new shortness of breath).    Past Medical History:  Diagnosis Date   Carotid artery stenosis    Without infarction   Chronic combined systolic (congestive) and diastolic (congestive) heart failure (HCC)    COPD (chronic obstructive pulmonary disease) (HCC)    Coronary artery disease    Heart valve replaced by other means    Hypercholesterolemia    Pure   Hypertension    Unspecified   LBBB (left bundle branch block)    Macular degeneration (senile) of retina, unspecified    On home O2    Postsurgical aortocoronary bypass status    Stroke (HCC)    Transient global amnesia    Unspecified hereditary and idiopathic peripheral neuropathy    Unspecified vitamin D deficiency    Past Surgical History:  Procedure Laterality Date   AORTIC VALVE REPLACEMENT     APPENDECTOMY     CORONARY ARTERY BYPASS GRAFT     HIP PINNING,CANNULATED Right 07/18/2018   Procedure: RIGHT CANNULATED HIP PINNING;  Surgeon: Myrene Galas, MD;  Location: [facility] OR;  Service: Orthopedics;  Laterality: Right;   TONSILLECTOMY       Current Meds  Medication Sig   acetaminophen (TYLENOL) 650 MG CR tablet Take 650 mg by mouth every 8 (eight) hours as needed  for pain.   ALPRAZolam (XANAX) 0.25 MG tablet Take 0.25 mg by mouth at bedtime as needed for anxiety.   atorvastatin (LIPITOR) 40 MG tablet Take 1 tablet (40 mg total) by mouth daily.   baclofen (LIORESAL) 10 MG tablet Take 10 mg by mouth as needed for muscle spasms.   carvedilol (COREG)  6.25 MG tablet TAKE (1) TABLET BY MOUTH TWICE DAILY.   docusate sodium (COLACE) 100 MG capsule Take 100 mg by mouth at bedtime.    donepezil (ARICEPT) 10 MG tablet TAKE ONE TABLET BY MOUTH ONCE DAILY.   ergocalciferol (VITAMIN D2) 50000 UNITS capsule Take 50,000 Units by mouth every Tuesday.    ferrous sulfate 325 (65 FE) MG EC tablet Take 325 mg by mouth 2 (two) times daily.   furosemide (LASIX) 20 MG tablet TAKE ONE TABLET DAILY AND TAKE AN EXTRA 1 TABLET AS NEEDED   HYDROcodone-acetaminophen (NORCO/VICODIN) 5-325 MG tablet Take 1-2 tablets by mouth every 6 (six) hours as needed for moderate pain.   hydrocortisone (ANUSOL-HC) 2.5 % rectal cream Place 1 application rectally 3 (three) times daily as needed for hemorrhoids or anal itching.   levalbuterol (XOPENEX) 0.63 MG/3ML nebulizer solution Take 0.63 mg by nebulization every evening.   meclizine (ANTIVERT) 25 MG tablet Take 25 mg by mouth 3 (three) times daily as needed for dizziness.   Melatonin 3 MG TABS Take 1 tablet by mouth at bedtime.   methylPREDNISolone (MEDROL DOSEPAK) 4 MG TBPK tablet    multivitamin-lutein (OCUVITE-LUTEIN) CAPS capsule Take 1 capsule by mouth daily.   ondansetron (ZOFRAN) 4 MG tablet Take 4 mg by mouth every 8 (eight) hours as needed for nausea or vomiting.   polyethylene glycol (MIRALAX / GLYCOLAX) packet Take 17 g by mouth daily as needed.    pregabalin (LYRICA) 75 MG capsule Take 1 capsule (75 mg total) by mouth every other day.   spironolactone (ALDACTONE) 25 MG tablet Take 0.5 tablets (12.5 mg total) by mouth daily.   traMADol (ULTRAM) 50 MG tablet Take 1 tablet (50 mg total) by mouth every 6 (six) hours as needed.   umeclidinium-vilanterol (ANORO ELLIPTA) 62.5-25 MCG/INH AEPB Inhale 1 puff into the lungs daily.    warfarin (COUMADIN) 2.5 MG tablet Take 2.5 mg by mouth every evening.   warfarin (COUMADIN) 5 MG tablet TAKE 1/2 TO 1 TABLET ONCE DAILY AS DIRECTED BY COUMADIN CLINIC.      Allergies:   Codeine and Penicillins   Social History   Tobacco Use   Smoking status: Former Smoker    Packs/day: 0.50    Types: Cigarettes    Last attempt to quit: 11/30/1991    Years since quitting: 27.3   Smokeless tobacco: Never Used   Tobacco comment: Tobacco use-no  Substance Use Topics   Alcohol use: No   Drug use: No     Family Hx: The patient's family history includes COPD in her sister; Heart attack in her father; Neuropathy in her brother; Stroke in her sister.  ROS:   Please see the history of present illness.    No fevers, chills or productive cough. All other systems reviewed and are negative.  Recent Labs: 06/26/2018: B Natriuretic Peptide 1,329.0 07/02/2018: ALT 19 07/21/2018: Magnesium 1.9; TSH 1.942 08/25/2018: Hemoglobin 10.3; Platelets 193 01/08/2019: BUN 15; Creatinine, Ser 1.03; Potassium 4.5; Sodium 145   Recent Lipid Panel Lab Results  Component Value Date/Time   CHOL 151 07/03/2018 07:00 AM   TRIG 57 07/03/2018 07:00 AM   HDL 56  07/03/2018 07:00 AM   CHOLHDL 2.7 07/03/2018 07:00 AM   LDLCALC 84 07/03/2018 07:00 AM    Wt Readings from Last 3 Encounters:  04/05/19 109 lb (49.4 kg)  01/08/19 110 lb 4 oz (50 kg)  11/10/18 107 lb (48.5 kg)     Objective:    Vital Signs:  BP 128/62    Pulse 60    Temp (!) 95 F (35 C)    Ht 5' (1.524 m)    Wt 109 lb (49.4 kg)    SpO2 99%    BMI 21.29 kg/m    VITAL SIGNS:  reviewed  No acute distress Answers questions appropriately Normal affect Remainder of physical examination not performed (telehealth visit; coronavirus pandemic)  ASSESSMENT & PLAN:    1. Chronic systolic congestive heart failure-volume status appears to be reasonable at present.  We will continue with present dose of diuretics.  We discussed fluid restriction and low-sodium diet.  2. Cardiomyopathy-continue beta-blocker; ARB DCed due to hypotension. 3. Coronary artery disease-no chest pain.  Continue statin.  Aspirin was  discontinued previously because of thigh hematoma. 4. Aortic valve replacement-continue Coumadin and SBE prophylaxis.  As outlined above aspirin was discontinued as combination of Coumadin and aspirin because thigh hematoma.  We are planning conservative measures given patient's age. 5. Hypertension-patient's blood pressure is controlled.  Continue present medications and follow. 6. Hyperlipidemia-continue statin. 7. Carotid artery disease-mild on most recent Dopplers.  COVID-19 Education: The importance of social distancing was discussed today.  Time:   Today, I have spent 15 minutes with the patient with telehealth technology discussing the above problems.     Medication Adjustments/Labs and Tests Ordered: Current medicines are reviewed at length with the patient today.  Concerns regarding medicines are outlined above.   Tests Ordered: No orders of the defined types were placed in this encounter.   Medication Changes: No orders of the defined types were placed in this encounter.   Disposition:  Follow up in 3 month(s)  Signed, [name], MD  04/05/2019 11:35 AM    [facility]

## 2019-04-03 NOTE — Patient Outreach (Signed)
[facility] Care Management  04/03/2019   Josephine CablesRuth E [name] [birth year] [id number]      Outreach attempt # 2 to the patient for initial assessment.  HIPAA provided by Lupita Leashonna (daughter on ROI)  Mrs. Val EagleO' Beverely PaceBryant and her daughter Lupita LeashDonna were on speaker phone completing the assessment together.  Social: The patient lives in the home with a 24 hour caregiver [name] who is also in the ROI.  The patient denies any pain or falls.  She is independent with her ADLS and independent /assist with her IADLS.  She has transportation to appointments. Th durable medical equipment in the home include:  Scale, walker, shower chair, bedside commode, wheeler, oxygen and BP cuff.  Conditions:Per chart review and conversation with Lupita LeashDonna and the patient's condition include: HTN, Aortic valve disorder, CHF, COPD, Dementia Hip fracture, CKD stage 3, and Anemia.  Lupita LeashDonna states that the patient weighs and has her vitals taken daily her weight today 108 lbs, 116/44 HR 54.  Her vitals are recorded daily. Lupita LeashDonna states that the patient ha an echo done in March and her EF was 25 to 30 %.  She states that she monitors her diet for sodium.  They are trying to get her to drink more water because she does not drink a lot.  Lupita LeashDonna states that the patient usually exercises three days a week at the gym, since covid- 19 she is using an exercise bike she has at home along with exercises given to her to perform.  Medications:  Per chart review and speaking with Lupita LeashDonna the patient is on twenty three medications.  Lupita LeashDonna and caregiver prepare the medication.  The patient takes her medications as prescribed. She does not express any concern with medications at this time but [facility] for medication assistance. Advise Lupita LeashDonna that in the future to let me know if they begin to have problems with paying for medications and I will notify the pharmacy.   Appointments:  The patient has an Telemed appointment with Dr  Jens Somrenshaw her cardiologist this Thursday.  Advanced Directives: The patient states that she has a living will and DNR.  Daughter Lupita LeashDonna on ROI states that a copy should be filed.    Current Medications:  Current Outpatient Medications  Medication Sig Dispense Refill  . acetaminophen (TYLENOL) 650 MG CR tablet Take 650 mg by mouth every 8 (eight) hours as needed for pain.    Marland Kitchen. ALPRAZolam (XANAX) 0.25 MG tablet Take 0.25 mg by mouth at bedtime as needed for anxiety.    Marland Kitchen. atorvastatin (LIPITOR) 40 MG tablet Take 1 tablet (40 mg total) by mouth daily. 90 tablet 3  . baclofen (LIORESAL) 10 MG tablet Take 10 mg by mouth as needed for muscle spasms.    . carvedilol (COREG) 6.25 MG tablet TAKE (1) TABLET BY MOUTH TWICE DAILY. 180 tablet 3  . docusate sodium (COLACE) 100 MG capsule Take 200 mg by mouth at bedtime.    . donepezil (ARICEPT) 10 MG tablet TAKE ONE TABLET BY MOUTH ONCE DAILY. 30 tablet 0  . ergocalciferol (VITAMIN D2) 50000 UNITS capsule Take 50,000 Units by mouth every Tuesday.     . ferrous sulfate 325 (65 FE) MG EC tablet Take 325 mg by mouth 2 (two) times daily.    . furosemide (LASIX) 20 MG tablet TAKE ONE TABLET DAILY AND TAKE AN EXTRA 1 TABLET AS NEEDED 180 tablet 3  . HYDROcodone-acetaminophen (NORCO/VICODIN) 5-325 MG tablet Take 1-2 tablets by  mouth every 6 (six) hours as needed for moderate pain. 60 tablet 0  . hydrocortisone (ANUSOL-HC) 2.5 % rectal cream Place 1 application rectally 3 (three) times daily as needed for hemorrhoids or anal itching.    . levalbuterol (XOPENEX) 0.63 MG/3ML nebulizer solution Take 0.63 mg by nebulization every evening.    . meclizine (ANTIVERT) 25 MG tablet Take 25 mg by mouth 3 (three) times daily as needed for dizziness.    . Melatonin 3 MG TABS Take 1 tablet by mouth at bedtime.    . multivitamin-lutein (OCUVITE-LUTEIN) CAPS capsule Take 1 capsule by mouth daily.    . ondansetron (ZOFRAN) 4 MG tablet Take 4 mg by mouth every 8 (eight) hours as  needed for nausea or vomiting.    . polyethylene glycol (MIRALAX / GLYCOLAX) packet Take 17 g by mouth daily as needed.     . pregabalin (LYRICA) 75 MG capsule Take 1 capsule (75 mg total) by mouth every other day. 15 capsule 0  . traMADol (ULTRAM) 50 MG tablet Take 1 tablet (50 mg total) by mouth every 6 (six) hours as needed. 60 tablet 0  . umeclidinium-vilanterol (ANORO ELLIPTA) 62.5-25 MCG/INH AEPB Inhale 1 puff into the lungs daily.     Marland Kitchen warfarin (COUMADIN) 2.5 MG tablet Take 2.5 mg by mouth every evening.    . warfarin (COUMADIN) 5 MG tablet TAKE 1/2 TO 1 TABLET ONCE DAILY AS DIRECTED BY COUMADIN CLINIC. 30 tablet 0  . acetaminophen (TYLENOL) 325 MG tablet Take 650 mg by mouth every 4 (four) hours as needed.    Marland Kitchen acetaminophen (TYLENOL) 500 MG tablet Take 1 tablet (500 mg total) by mouth every 8 (eight) hours. (Patient not taking: Reported on 04/03/2019) 30 tablet 0  . spironolactone (ALDACTONE) 25 MG tablet Take 0.5 tablets (12.5 mg total) by mouth daily. 90 tablet 3   No current facility-administered medications for this visit.     Functional Status:  In your present state of health, do you have any difficulty performing the following activities: 04/03/2019 09/05/2018  Hearing? Y Y  Comment one hearing aid -  Vision? N N  Difficulty concentrating or making decisions? N N  Walking or climbing stairs? Y Y  Comment hip problems Post ortho surgery.  Dressing or bathing? N N  Doing errands, shopping? N Y  Quarry manager and eating ? - N  Using the Toilet? - N  In the past six months, have you accidently leaked urine? - Y  Comment - Wears adult briefs.  Do you have problems with loss of bowel control? - N  Managing your Medications? - Y  Comment - Patient reported daughter prepares medications.  Managing your Finances? - Y  Comment - Daughter is POC.  Housekeeping or managing your Housekeeping? - Y  Comment - In home caregiver.  Some recent data might be hidden     Fall/Depression Screening: Fall Risk  04/03/2019 09/26/2018 09/05/2018  Falls in the past year? 1 1 Yes  Number falls in past yr: or more  Injury with Fall? 0 - No  Risk Factor Category  - - High Fall Risk  Risk for fall due to : (No Data) History of fall(s) -  Risk for fall due to: Comment not sure how she fell - -  Follow up Education provided - Education provided;Falls prevention discussed   PHQ 2/9 Scores 04/03/2019 03/08/2019 11/10/2018 09/26/2018 09/05/2018  PHQ - 2 Score 2 0 0 0 0  PHQ- 9  Score 5 - - - -    Assessment: Patient will benefit from health coach outreach for disease management and support.   [facility] CM Care Plan Problem One     Most Recent Value  Care Plan Problem One  Knowledge deficit related to diease managment  Role Documenting the Problem One  Health Coach  Care Plan for Problem One  Active  [facility] Long Term Goal   Patient will not be rehospitalized over the next 90 days.  [facility] Long Term Goal Start Date  04/03/19  Interventions for Problem One Long Term Goal  Reviewed signs and symptoms of heart failure, discussed with patient chf action plan, encouraged patient to weigh daily, reviewed medications encouraged medication compliance     Plan: RN Health Coach will provide ongoing education for patient on heart failure through phone calls and sending printed information to patient for further discussion.  RN Health Coach will send printed information on heart failure.  RN Health Coach will send initial barriers letter, assessment, and care plan to primary care physician. RN Health Coach will contact patient in the month of June  and patient agrees to next outreach.     [name] RN, BSN, [facility] RN Health Coach Disease Management [facility] Dial:  [phone]  Fax: [phone]

## 2019-04-05 ENCOUNTER — Encounter: Payer: Self-pay | Admitting: Cardiology

## 2019-04-05 ENCOUNTER — Telehealth (INDEPENDENT_AMBULATORY_CARE_PROVIDER_SITE_OTHER): Payer: Medicare Other | Admitting: Cardiology

## 2019-04-05 VITALS — BP 128/62 | HR 60 | Temp 95.0°F | Ht 60.0 in | Wt 109.0 lb

## 2019-04-05 DIAGNOSIS — Z952 Presence of prosthetic heart valve: Secondary | ICD-10-CM

## 2019-04-05 DIAGNOSIS — I420 Dilated cardiomyopathy: Secondary | ICD-10-CM

## 2019-04-05 DIAGNOSIS — I5022 Chronic systolic (congestive) heart failure: Secondary | ICD-10-CM | POA: Diagnosis not present

## 2019-04-05 DIAGNOSIS — I2510 Atherosclerotic heart disease of native coronary artery without angina pectoris: Secondary | ICD-10-CM

## 2019-04-05 NOTE — Patient Instructions (Signed)
Medication Instructions:  NO CHANGE If you need a refill on your cardiac medications before your next appointment, please call your pharmacy.   Lab work: If you have labs (blood work) drawn today and your tests are completely normal, you will receive your results only by: . MyChart Message (if you have MyChart) OR . A paper copy in the mail If you have any lab test that is abnormal or we need to change your treatment, we will call you to review the results.  Follow-Up: At [facility], you and your health needs are our priority.  As part of our continuing mission to provide you with exceptional heart care, we have created designated Provider Care Teams.  These Care Teams include your primary Cardiologist (physician) and Advanced Practice Providers (APPs -  Physician Assistants and Nurse Practitioners) who all work together to provide you with the care you need, when you need it. Your physician recommends that you schedule a follow-up appointment in: 3 MONTHS WITH DR CRENSHAW      

## 2019-04-13 ENCOUNTER — Telehealth: Payer: Self-pay | Admitting: Pharmacist

## 2019-04-13 NOTE — Telephone Encounter (Signed)
1. COVID-19 Pre-Screening Questions:  . In the past 7 to 10 days have you had a cough,  shortness of breath, headache, congestion, fever (100 or greater) body aches, chills, sore throat, or sudden loss of taste or sense of smell? . Have you been around anyone with known Covid 19. . Have you been around anyone who is awaiting Covid 19 test results in the past 7 to 10 days? . Have you been around anyone who has been exposed to Covid 19, or has mentioned symptoms of Covid 19 within the past 7 to 10 days? no  2. Pt advised of visitor restrictions (no visitors allowed except if needed to conduct the visit). Also advised to arrive at appointment time and wear a mask.

## 2019-04-18 ENCOUNTER — Other Ambulatory Visit: Payer: Self-pay

## 2019-04-18 ENCOUNTER — Ambulatory Visit (INDEPENDENT_AMBULATORY_CARE_PROVIDER_SITE_OTHER): Payer: Medicare Other | Admitting: Pharmacist

## 2019-04-18 DIAGNOSIS — Z952 Presence of prosthetic heart valve: Secondary | ICD-10-CM | POA: Diagnosis not present

## 2019-04-18 DIAGNOSIS — I359 Nonrheumatic aortic valve disorder, unspecified: Secondary | ICD-10-CM

## 2019-04-18 DIAGNOSIS — Z5181 Encounter for therapeutic drug level monitoring: Secondary | ICD-10-CM | POA: Diagnosis not present

## 2019-04-18 LAB — POCT INR: INR: 2.6 (ref 2.0–3.0)

## 2019-04-18 NOTE — Patient Instructions (Signed)
Description   Continue taking same dosage 1/2 tablet daily except 1 tablet on Tuesdays and Saturdays.  Repeat INR in 6 weeks.  Coumadin Clinic [phone]    

## 2019-04-19 DIAGNOSIS — I5020 Unspecified systolic (congestive) heart failure: Secondary | ICD-10-CM | POA: Diagnosis not present

## 2019-04-19 DIAGNOSIS — J9611 Chronic respiratory failure with hypoxia: Secondary | ICD-10-CM | POA: Diagnosis not present

## 2019-04-19 DIAGNOSIS — J449 Chronic obstructive pulmonary disease, unspecified: Secondary | ICD-10-CM | POA: Diagnosis not present

## 2019-04-19 DIAGNOSIS — I10 Essential (primary) hypertension: Secondary | ICD-10-CM | POA: Diagnosis not present

## 2019-04-24 DIAGNOSIS — H353212 Exudative age-related macular degeneration, right eye, with inactive choroidal neovascularization: Secondary | ICD-10-CM | POA: Diagnosis not present

## 2019-04-24 DIAGNOSIS — H353221 Exudative age-related macular degeneration, left eye, with active choroidal neovascularization: Secondary | ICD-10-CM | POA: Diagnosis not present

## 2019-04-24 DIAGNOSIS — H35721 Serous detachment of retinal pigment epithelium, right eye: Secondary | ICD-10-CM | POA: Diagnosis not present

## 2019-04-26 ENCOUNTER — Ambulatory Visit: Payer: Self-pay

## 2019-04-26 ENCOUNTER — Other Ambulatory Visit: Payer: Self-pay

## 2019-04-26 NOTE — Patient Outreach (Signed)
[facility]) Care Management  04/26/2019  [name] [birth year] [id number]    1st unsuccessful outreach.  No answer.  HIPAA compliant voicemail left with contact information.  Plan: RN Health Coach will send letter. [place] will make outreach attempt to the patient within thirty business days.  Lazaro Arms RN, BSN, [place] Direct Dial:  [phone]  Fax: [phone]

## 2019-05-03 ENCOUNTER — Other Ambulatory Visit: Payer: Self-pay | Admitting: Pharmacy Technician

## 2019-05-03 ENCOUNTER — Other Ambulatory Visit: Payer: Self-pay | Admitting: Pharmacist

## 2019-05-03 NOTE — Patient Outreach (Signed)
[place] [facility]) Care Management  05/03/2019  AURIAH HOLLINGS 1926-02-13 [id number]   Incoming call from patients daughter, [name], requesting to start the patient assistance process for mom's Anoro Ellipta. Confirmed mailing address.  Will route note to [place] to review medication list.  Will mail out [place] application to patient and fax provider portion to Dr. Luan Pulling upon verification from [place].  Maud Deed Chana Bode [place] Certified Pharmacy Technician [place] Management Direct Dial:[phone]

## 2019-05-07 ENCOUNTER — Other Ambulatory Visit: Payer: Self-pay | Admitting: Pharmacy Technician

## 2019-05-07 ENCOUNTER — Encounter: Payer: Self-pay | Admitting: Pharmacy Technician

## 2019-05-07 NOTE — Patient Outreach (Signed)
[facility]) Care Management  05/07/2019  [name] 07-31-1926 [id number]                          Medication Assistance Referral  Referral From: [facility] Jenne Pane  Medication/Company: [name] / [name] Patient application portion:  Mailed Provider application portion: Faxed  to [place]   Follow up:  Will follow up with patient in 7-10 business days to confirm application(s) have been received.  Maud Deed Chana Bode [place] Certified Pharmacy Technician [place] Management Direct Dial:[phone]

## 2019-05-08 ENCOUNTER — Other Ambulatory Visit: Payer: Self-pay | Admitting: Cardiology

## 2019-05-08 DIAGNOSIS — H353230 Exudative age-related macular degeneration, bilateral, stage unspecified: Secondary | ICD-10-CM | POA: Diagnosis not present

## 2019-05-08 DIAGNOSIS — Z961 Presence of intraocular lens: Secondary | ICD-10-CM | POA: Diagnosis not present

## 2019-05-09 ENCOUNTER — Other Ambulatory Visit: Payer: Self-pay

## 2019-05-09 NOTE — Patient Outreach (Signed)
[facility]) Care Management  [place]   05/03/2019  KERISHA GOUGHNOUR 1926/03/17 [id number]  Reason for referral: medication assistance  Referral source: [facility] RN CM Referral medication(s): Anoro Current insurance: BCBS  PMHx: CAD, CHF, CVA, HTN, COPD, HLD, CKD III, dementia  HPI: Successful incoming call from patient's daughter requesting [place] services to aid in medication assistance.  Patient received Anoro Ellipta via [place] last year once her $68 OOP was met.  Patient stable on Anoro Ellipta per daughter.  She has been unable to get Xopenex nebs filled due to cost (no assistance for PartB drugs).  INR has been stable for the past two months (2.6)  Objective:     Medications Reviewed Today                Reviewed by Lavera Guise, [facility] (Pharmacist) on 09/06/18 at [place] List Status: <None>             Medication Order Taking? Sig Documenting Provider Last Dose Status Informant   acetaminophen (TYLENOL) 325 MG tablet [id number] Yes Take 650 mg by mouth every 4 (four) hours as needed. [provider] Taking Active    acetaminophen (TYLENOL) 500 MG tablet [id number] Yes Take 1 tablet (500 mg total) by mouth every 8 (eight) hours. Ainsley Spinner, PA-C Taking Active Nursing Home Medication Administration Guide (MAG)   ALPRAZolam Duanne Moron) 0.25 MG tablet [id number] Yes Take 0.25 mg by mouth at bedtime as needed for anxiety. [provider] Taking Active    atorvastatin (LIPITOR) 40 MG tablet [id number] Yes Take 1 tablet (40 mg total) by mouth daily. Lelon Perla, MD Taking Active Nursing Home Medication Administration Guide ([place])   [address] [place]) [employer] [id number] Yes Apply to sacrum and to bilateral buttocks qshift and prn [provider] Taking Active    carvedilol (COREG) 6.25 MG tablet [id number] Yes Take 6.25 mg by mouth 2 (two) times daily with a meal. [provider] Taking Active  Nursing Home Medication Administration Guide (MAG)   docusate sodium (COLACE) 100 MG capsule [id number] Yes Take 200 mg by mouth at bedtime. [provider] Taking Active Nursing Home Medication Administration Guide (MAG)   donepezil (ARICEPT) 10 MG tablet [id number] Yes TAKE ONE TABLET BY MOUTH ONCE DAILY. Penni Bombard, MD Taking Active Nursing Home Medication Administration Guide (MAG)   ergocalciferol (VITAMIN D2) 50000 UNITS capsule [id number] Yes Take 50,000 Units by mouth every Tuesday.  [provider] Taking Active Nursing Home Medication Administration Guide (MAG)             Med Note Lolita Cram, SALLY C   Wed Aug 02, 2018  9:04 AM)     ferrous sulfate 325 (65 FE) MG EC tablet [id number] Yes Take 325 mg by mouth 2 (two) times daily. [provider] Taking Active Nursing Home Medication Administration Guide (MAG)   furosemide (LASIX) 20 MG tablet [id number] Yes Take 1 tablet (20 mg total) by mouth daily. Florencia Reasons, MD Taking Active Nursing Home Medication Administration Guide (MAG)             Med Note Lolita Cram, Janett Billow   Wed Aug 02, 2018  9:04 AM)     HYDROcodone-acetaminophen (NORCO/VICODIN) 5-325 MG tablet [id number] Yes Take 1-2 tablets by mouth every 6 (six) hours as needed for moderate pain. Wille Celeste, PA-C Taking Active    hydrocortisone (ANUSOL-HC) 2.5 % rectal cream [id number] Yes Place 1 application rectally 3 (three)  times daily as needed for hemorrhoids or anal itching. [provider] Taking Active    levalbuterol Penne Lash) 0.63 MG/3ML nebulizer solution [id number] Yes Take 3 mLs (0.63 mg total) by nebulization every 8 (eight) hours as needed for wheezing or shortness of breath. Sinda Du, MD Taking Active Nursing Home Medication Administration Guide (MAG)   meclizine (ANTIVERT) 25 MG tablet [id number] Yes Take 25 mg by mouth 3 (three) times daily as needed for dizziness. [provider] Taking Active Nursing Home  Medication Administration Guide (MAG)   ondansetron (ZOFRAN) 4 MG tablet [id number] Yes Take 4 mg by mouth every 8 (eight) hours as needed for nausea or vomiting. [provider] Taking Active Nursing Home Medication Administration Guide (MAG)   polyethylene glycol [facility] / GLYCOLAX) packet [id number] Yes Take 17 g by mouth daily. [provider] Taking Active Nursing Home Medication Administration Guide (MAG)   pregabalin (LYRICA) 75 MG capsule [id number] Yes Take 1 capsule (75 mg total) by mouth every other day. Wille Celeste, PA-C Taking Active    traMADol (ULTRAM) 50 MG tablet [id number] Yes Take 1 tablet (50 mg total) by mouth every 6 (six) hours as needed. Granville Lewis C, PA-C Taking Active    umeclidinium-vilanterol (ANORO ELLIPTA) 62.5-25 MCG/INH AEPB [id number] Yes Inhale 1 puff into the lungs daily.  [provider] Taking Active Nursing Home Medication Administration Guide (MAG)   warfarin (COUMADIN) 2.5 MG tablet [id number] Yes Take 2.5 mg by mouth every evening. [provider] Taking Active              Med Note Minerva Ends, [facility]   Tue Sep 05, 2018 11:20 AM) Reported taking 2.5 except on Saturday.   Med List Note Gwynne Edinger 07/16/18 1929): Resident of [facility] [phone]           Assessment:  Drugs sorted by system:  Neurologic/Psychologic: alprazolam, donepezil, pregabalin  Cardiovascular: atorvastatin, carvedilol, warfarin  Pulmonary/Allergy: levalbuterol, Anoro Ellipta  Gastrointestinal: docusate, meclizine, Miralax, ondansetron  Topical: Venelex Oint, Anusol Rectal Cream  Pain: APAP, hydrocodone/APAP, tramadol  Vitamins/Minerals/Supplements: vitD2, ferrous sulfate   Medication Review Findings:   Meclizine is not preferred in geriatric patients: Per the Beers List, the antihistamine in this medication is highly anticholinergic, clearance may be reduced with advanced age,  tolerance may develop when used as a hypnotic and should be avoided in the elderly. Antihistamine use in the elderly poses a greater risk of confusion, dry mouth, constipation, or other anticholinergic effects or toxicity  Alprazolam is not preferred in geriatric patients: This drug is identified in the Beers Criteria as a potentially inappropriate medication to be avoided in patients 65 years and older (independent of diagnosis or condition) due to increased risk of impaired cognition, delirium, falls, fractures, and motor vehicle accidents with benzodiazepine use. Per Beers list, this medication should be avoided in elderly patients with dementia or cognitive impairment because of adverse CNS effects.  ? Patient is taking these medications PRN and sparingly due to side effects and potential harm.  Patient counseled on side effects.  Medication Assistance Findings:   Patient Assistance Programs: 1. Anoro made by [place] ? Income requirement met: [x] ? Yes [] ? No [] ? Unknown ? Will have to meet $600 OOP    Plan: -[facility] CPhT Etter Sjogren to assist with PAP application process  Regina Eck, PharmD, [place]  [phone]

## 2019-05-09 NOTE — Patient Outreach (Addendum)
[facility]  05/09/2019   Colleen CablesRuth E O'Simpson 31-May-1926 [id number]  Subjective: Successful outreach to [name] (on consent).  Two patient Identifiers obtained.  [name] states she is unsure if the information that I had mailed was received.  She is gong to check and if not, call me and I will have it sent again. [name] states that the patient has been doing fine. She has not complained of any chest pain, shortness of breath, swelling and no falls. [name] states that she does complain of tightness in her right knee.  [name] feels that it could be arthritis or just a continuation of the healing process. She takes tylenol arthritis to help.  The patient is weighing daily and today her weight was 109 lbs.  She is eating well and is taking her medications as prescribed. [name] Anora. [name] states that the patient has exercises to follow left by physical therapy.  The patient states she uses the exercises but [name] has not seen them done in front of her.   Current Medications:  Current Outpatient Medications  Medication Sig Dispense Refill  . acetaminophen (TYLENOL) 325 MG tablet Take 650 mg by mouth every 4 (four) hours as needed.    . ALPRAZolam (XANAX) 0.25 MG tablet Take 0.25 mg by mouth at bedtime as needed for anxiety.    [name]. atorvastatin (LIPITOR) 40 MG tablet Take 1 tablet (40 mg total) by mouth daily. 90 tablet 3  . baclofen (LIORESAL) 10 MG tablet Take 10 mg by mouth as needed for muscle spasms.    . carvedilol (COREG) 6.25 MG tablet TAKE (1) TABLET BY MOUTH TWICE DAILY. 180 tablet 3  . docusate sodium (COLACE) 100 MG capsule Take 100 mg by mouth at bedtime.     . donepezil (ARICEPT) 10 MG tablet TAKE ONE TABLET BY MOUTH ONCE DAILY. 30 tablet 0  . ergocalciferol (VITAMIN D2) 50000 UNITS capsule Take 50,000 Units by mouth every Tuesday.     . ferrous sulfate 325 (65 FE) MG EC tablet Take 325 mg by mouth 2 (two) times daily.     . furosemide (LASIX) 20 MG tablet TAKE ONE TABLET DAILY AND TAKE AN EXTRA 1 TABLET AS NEEDED 180 tablet 3  . HYDROcodone-acetaminophen (NORCO/VICODIN) 5-325 MG tablet Take 1-2 tablets by mouth every 6 (six) hours as needed for moderate pain. 60 tablet 0  . hydrocortisone (ANUSOL-HC) 2.5 % rectal cream Place 1 application rectally 3 (three) times daily as needed for hemorrhoids or anal itching.    . levalbuterol (XOPENEX) 0.63 MG/3ML nebulizer solution Take 0.63 mg by nebulization every evening.    . meclizine (ANTIVERT) 25 MG tablet Take 25 mg by mouth 3 (three) times daily as needed for dizziness.    . Melatonin 3 MG TABS Take 1 tablet by mouth at bedtime.    . methylPREDNISolone (MEDROL DOSEPAK) 4 MG TBPK tablet     . multivitamin-lutein (OCUVITE-LUTEIN) CAPS capsule Take 1 capsule by mouth daily.    . ondansetron (ZOFRAN) 4 MG tablet Take 4 mg by mouth every 8 (eight) hours as needed for nausea or vomiting.    . polyethylene glycol (MIRALAX / GLYCOLAX) packet Take 17 g by mouth daily as needed.     . pregabalin (LYRICA) 75 MG capsule Take 1 capsule (75 mg total) by mouth every other day. 15 capsule 0  . spironolactone (ALDACTONE) 25 MG tablet Take 0.5 tablets (12.5 mg total) by mouth  daily. 90 tablet 3  . traMADol (ULTRAM) 50 MG tablet Take 1 tablet (50 mg total) by mouth every 6 (six) hours as needed. 60 tablet 0  . umeclidinium-vilanterol (ANORO ELLIPTA) 62.5-25 MCG/INH AEPB Inhale 1 puff into the lungs daily.     [name] warfarin (COUMADIN) 2.5 MG tablet Take 2.5 mg by mouth every evening.    . warfarin (COUMADIN) 5 MG tablet TAKE 1/2 TO 1 TABLET ONCE DAILY AS DIRECTED BY COUMADIN CLINIC. 30 tablet 0   No current facility-administered medications for this visit.     Functional Status:  In your present state of health, do you have any difficulty performing the following activities: 04/03/2019 09/05/2018  Hearing? Y Y  Comment one hearing aid -  Vision? N N  Difficulty concentrating or making  decisions? N N  Walking or climbing stairs? Y Y  Comment hip problems Post ortho surgery.  Dressing or bathing? N N  Doing errands, shopping? N Y  Conservation officer, nature and eating ? - N  Using the Toilet? - N  In the past six months, have you accidently leaked urine? - Y  Comment - Wears adult briefs.  Do you have problems with loss of bowel control? - N  Managing your Medications? - Y  Comment - Patient reported daughter prepares medications.  Managing your Finances? - Y  Comment - Daughter is POC.  Housekeeping or managing your Housekeeping? - Y  Comment - In home caregiver.  Some recent data might be hidden    Fall/Depression Screening: Fall Risk  05/09/2019 04/03/2019 09/26/2018  Falls in the past year? 0 1 1  Number falls in past yr: - 1 1  Injury with Fall? - 0 -  Risk Factor Category  - - -  Risk for fall due to : - (No Data) History of fall(s)  Risk for fall due to: Comment - not sure how she fell -  Follow up - Education provided -   [facility] 2/9 Scores 04/03/2019 03/08/2019 11/10/2018 09/26/2018 09/05/2018  PHQ - 2 Score 2 0 0 0 0  PHQ- 9 Score 5 - - - -    Assessment: Patient will continue to benefit from health coach outreach for disease management and support. [facility] CM Care Plan Problem One     Most Recent Value  [facility]N Long Term Goal   Patient will not be rehospitalized over the next 60 days.  [facility]N Long Term Goal Start Date  05/09/19  Interventions for Problem One Long Term Goal  Discussed signs and symptoms of CHF, encouraged to continue to weigh daily, encourage exercises left by physical therapy, reviewed medications and encouraged medication adherence.     Plan: RN Health Coach will contact patient in the month of August and patient agrees to next outreach.   Lazaro Arms RN, BSN, [place] Direct Dial:  [phone]  Fax: [phone]   Addendum:  [name] called back and stated that they did not receive the  information I had mailed.  I will have the information resent.  Lazaro Arms RN, BSN, [place] Direct Dial:  [phone]  Fax: [phone]

## 2019-05-15 NOTE — Telephone Encounter (Signed)
Opened in error

## 2019-05-16 ENCOUNTER — Other Ambulatory Visit: Payer: Self-pay | Admitting: Pharmacy Technician

## 2019-05-16 NOTE — Patient Outreach (Signed)
[facility]) Care Management  05/16/2019  [name] 1926-09-23 [id number]   Received patient and provider portion(s) of patient assistance application for Anoro Ellipta. Faxed completed application and required documents into [employer].  Will follow up with company in 5-7 business days to check status of application.  Maud Deed Chana Bode [place] Certified Pharmacy Technician [place] Management Direct [phone]

## 2019-05-23 ENCOUNTER — Telehealth: Payer: Self-pay

## 2019-05-23 NOTE — Telephone Encounter (Signed)

## 2019-05-28 ENCOUNTER — Other Ambulatory Visit: Payer: Self-pay | Admitting: Pharmacist

## 2019-05-28 NOTE — Patient Outreach (Signed)
[facility]) Care Management [place]  05/28/2019  [name] 1926/06/21 [id number]  Reason for referral: medication assistance   Successful care coordination call with patient's daughter, [name], with HIPAA identifiers verified.  New insurance card needed for [place] (Anoro) application to be complete.  Daughter states they will get a copy to me ASAP.  She states her mom is doing well and has no other additional needs at this time.  No changes to medication therapy.  Denies adverse events or concerns.  PLAN: - I will f/u as needed  Regina Eck, PharmD, [place]  [phone]

## 2019-05-30 ENCOUNTER — Other Ambulatory Visit: Payer: Self-pay

## 2019-05-30 ENCOUNTER — Ambulatory Visit (INDEPENDENT_AMBULATORY_CARE_PROVIDER_SITE_OTHER): Payer: Medicare Other | Admitting: Pharmacist

## 2019-05-30 DIAGNOSIS — Z5181 Encounter for therapeutic drug level monitoring: Secondary | ICD-10-CM | POA: Diagnosis not present

## 2019-05-30 DIAGNOSIS — I359 Nonrheumatic aortic valve disorder, unspecified: Secondary | ICD-10-CM

## 2019-05-30 DIAGNOSIS — Z952 Presence of prosthetic heart valve: Secondary | ICD-10-CM | POA: Diagnosis not present

## 2019-05-30 LAB — POCT INR: INR: 2.8 (ref 2.0–3.0)

## 2019-05-30 NOTE — Patient Instructions (Signed)
Continue taking same dosage 1/2 tablet daily except 1 tablet on Tuesdays and Saturdays.  Repeat INR in 6 weeks.  Coumadin Clinic [phone]

## 2019-06-04 ENCOUNTER — Other Ambulatory Visit: Payer: Self-pay | Admitting: Pharmacy Technician

## 2019-06-04 NOTE — Patient Outreach (Signed)
[facility] [facility]) Care Management  06/04/2019  [name] August 23, 1926 [id number]    Follow up call placed to [place] regarding patient assistance application(s) for [name] , [name] confirms patient has been approved as of 7/20 until 11/15/19. Medication to arrive at patients home in 7-10 business days.  Follow up:  Will follow up with patients daughter in 10-14 business days to confirm medication has been received.  Maud Deed Chana Bode [place] Certified Pharmacy Technician [place] Management Direct Dial:[phone]

## 2019-06-05 DIAGNOSIS — H353122 Nonexudative age-related macular degeneration, left eye, intermediate dry stage: Secondary | ICD-10-CM | POA: Diagnosis not present

## 2019-06-05 DIAGNOSIS — H35352 Cystoid macular degeneration, left eye: Secondary | ICD-10-CM | POA: Diagnosis not present

## 2019-06-05 DIAGNOSIS — H35052 Retinal neovascularization, unspecified, left eye: Secondary | ICD-10-CM | POA: Diagnosis not present

## 2019-06-05 DIAGNOSIS — H353221 Exudative age-related macular degeneration, left eye, with active choroidal neovascularization: Secondary | ICD-10-CM | POA: Diagnosis not present

## 2019-06-08 ENCOUNTER — Other Ambulatory Visit: Payer: Self-pay | Admitting: Cardiology

## 2019-06-18 ENCOUNTER — Other Ambulatory Visit: Payer: Self-pay | Admitting: Pharmacy Technician

## 2019-06-18 DIAGNOSIS — J9611 Chronic respiratory failure with hypoxia: Secondary | ICD-10-CM | POA: Diagnosis not present

## 2019-06-18 DIAGNOSIS — I2510 Atherosclerotic heart disease of native coronary artery without angina pectoris: Secondary | ICD-10-CM | POA: Diagnosis not present

## 2019-06-18 DIAGNOSIS — I5020 Unspecified systolic (congestive) heart failure: Secondary | ICD-10-CM | POA: Diagnosis not present

## 2019-06-18 DIAGNOSIS — I10 Essential (primary) hypertension: Secondary | ICD-10-CM | POA: Diagnosis not present

## 2019-06-18 NOTE — Patient Outreach (Signed)
[place] [facility]) Care Management  06/18/2019  NEERA TENG 06-22-1926 [id number]    Successful call placed to patients daughter, [name] regarding patient assistance medication delivery of Anoro Ellipta, HIPAA identifiers verified. [name] confirms that patient received 3 inhalers from [facility] last week. Requested that she contact me if she runs into any issues obtaining refills.  Follow up:  Will route note to [place] for case closure  Maud Deed. Chana Bode [place] Certified Pharmacy Technician [place] Management Direct [phone]

## 2019-06-21 ENCOUNTER — Other Ambulatory Visit: Payer: Self-pay | Admitting: Pharmacist

## 2019-07-05 ENCOUNTER — Ambulatory Visit (HOSPITAL_COMMUNITY)
Admission: RE | Admit: 2019-07-05 | Discharge: 2019-07-05 | Disposition: A | Discharge status: Home / Self Care | Payer: Medicare Other | Source: Ambulatory Visit | Attending: Cardiology | Admitting: Cardiology

## 2019-07-05 ENCOUNTER — Other Ambulatory Visit: Payer: Self-pay | Admitting: Cardiology

## 2019-07-05 ENCOUNTER — Other Ambulatory Visit (HOSPITAL_COMMUNITY): Payer: Self-pay | Admitting: Cardiology

## 2019-07-05 ENCOUNTER — Other Ambulatory Visit: Payer: Self-pay

## 2019-07-05 DIAGNOSIS — I6529 Occlusion and stenosis of unspecified carotid artery: Secondary | ICD-10-CM | POA: Insufficient documentation

## 2019-07-05 DIAGNOSIS — I779 Disorder of arteries and arterioles, unspecified: Secondary | ICD-10-CM

## 2019-07-05 NOTE — Patient Outreach (Signed)
[facility]  07/05/2019   Josephine CablesRuth E [name] 1926-05-15 [id number]  Subjective: Successful outreach.  Two patient identifiers given.  [name] ( on consent) and Mrs. Val EagleO' Beverely PaceBryant were on the call today.  The patient states that she is doing fine. Denies any shortness of breath, chest pain swelling or falls. [name] states that she is weighing daily and her weight ranges between 108-112 lbs.  She has been eating more and gained weight.  [name] states that they know when she needs to have an extra lasix pill. She is taking all of her medications. The patient states that she is exercising walking on her treadmill and riding her incline bike.  Encouraged the patient to continue her exercises.  She verbalized understanding.    Current Medications:  Current Outpatient Medications  Medication Sig Dispense Refill  . acetaminophen (TYLENOL) 325 MG tablet Take 650 mg by mouth every 4 (four) hours as needed.    . ALPRAZolam (XANAX) 0.25 MG tablet Take 0.25 mg by mouth at bedtime as needed for anxiety.    Marland Kitchen. atorvastatin (LIPITOR) 40 MG tablet Take 1 tablet (40 mg total) by mouth daily. 90 tablet 3  . baclofen (LIORESAL) 10 MG tablet Take 10 mg by mouth as needed for muscle spasms.    . carvedilol (COREG) 6.25 MG tablet TAKE (1) TABLET BY MOUTH TWICE DAILY. 180 tablet 3  . docusate sodium (COLACE) 100 MG capsule Take 100 mg by mouth at bedtime.     . donepezil (ARICEPT) 10 MG tablet TAKE ONE TABLET BY MOUTH ONCE DAILY. 30 tablet 0  . ergocalciferol (VITAMIN D2) 50000 UNITS capsule Take 50,000 Units by mouth every Tuesday.     . ferrous sulfate 325 (65 FE) MG EC tablet Take 325 mg by mouth 2 (two) times daily.    . furosemide (LASIX) 20 MG tablet TAKE ONE TABLET DAILY AND TAKE AN EXTRA 1 TABLET AS NEEDED 180 tablet 3  . HYDROcodone-acetaminophen (NORCO/VICODIN) 5-325 MG tablet Take 1-2 tablets by mouth every 6 (six) hours as needed for moderate pain. 60 tablet 0  . hydrocortisone  (ANUSOL-HC) 2.5 % rectal cream Place 1 application rectally 3 (three) times daily as needed for hemorrhoids or anal itching.    . levalbuterol (XOPENEX) 0.63 MG/3ML nebulizer solution Take 0.63 mg by nebulization every evening.    . meclizine (ANTIVERT) 25 MG tablet Take 25 mg by mouth 3 (three) times daily as needed for dizziness.    . Melatonin 3 MG TABS Take 1 tablet by mouth at bedtime.    . methylPREDNISolone (MEDROL DOSEPAK) 4 MG TBPK tablet     . multivitamin-lutein (OCUVITE-LUTEIN) CAPS capsule Take 1 capsule by mouth daily.    . ondansetron (ZOFRAN) 4 MG tablet Take 4 mg by mouth every 8 (eight) hours as needed for nausea or vomiting.    . polyethylene glycol (MIRALAX / GLYCOLAX) packet Take 17 g by mouth daily as needed.     . pregabalin (LYRICA) 75 MG capsule Take 1 capsule (75 mg total) by mouth every other day. 15 capsule 0  . spironolactone (ALDACTONE) 25 MG tablet Take 0.5 tablets (12.5 mg total) by mouth daily. 90 tablet 3  . traMADol (ULTRAM) 50 MG tablet Take 1 tablet (50 mg total) by mouth every 6 (six) hours as needed. 60 tablet 0  . umeclidinium-vilanterol (ANORO ELLIPTA) 62.5-25 MCG/INH AEPB Inhale 1 puff into the lungs daily.     Marland Kitchen. warfarin (COUMADIN) 2.5 MG tablet Take 2.5 mg  by mouth every evening.    . warfarin (COUMADIN) 5 MG tablet TAKE 1/2 TO 1 TABLET ONCE DAILY AS DIRECTED BY COUMADIN CLINIC. 30 tablet 3   No current facility-administered medications for this visit.     Functional Status:  In your present state of health, do you have any difficulty performing the following activities: 04/03/2019 09/05/2018  Hearing? Y Y  Comment one hearing aid -  Vision? N N  Difficulty concentrating or making decisions? N N  Walking or climbing stairs? Y Y  Comment hip problems Post ortho surgery.  Dressing or bathing? N N  Doing errands, shopping? N Y  Conservation officer, nature and eating ? - N  Using the Toilet? - N  In the past six months, have you accidently leaked urine? - Y   Comment - Wears adult briefs.  Do you have problems with loss of bowel control? - N  Managing your Medications? - Y  Comment - Patient reported daughter prepares medications.  Managing your Finances? - Y  Comment - Daughter is POC.  Housekeeping or managing your Housekeeping? - Y  Comment - In home caregiver.  Some recent data might be hidden    Fall/Depression Screening: Fall Risk  07/05/2019 05/09/2019 04/03/2019  Falls in the past year? 0 0 1  Number falls in past yr: - - 1  Injury with Fall? - - 0  Risk Factor Category  - - -  Risk for fall due to : - - (No Data)  Risk for fall due to: Comment - - not sure how she fell  Follow up - - Education provided   [facility] 2/9 Scores 04/03/2019 03/08/2019 11/10/2018 09/26/2018 09/05/2018  PHQ - 2 Score 2 0 0 0 0  PHQ- 9 Score 5 - - - -   Assessment: Patient will continue to benefit from health coach outreach for disease management and support. [facility] CM Care Plan Problem One     Most Recent Value  [facility]N Long Term Goal   Patient will not be hospitalized over the next 60 days. for CHF  [facility]N Long Term Goal Start Date  07/05/19  Interventions for Problem One Long Term Goal  Reviewed signs and symptoms of CHF, Encourage medication adhrence, encouraged continued exercise, encouraged patient to weigh daily       Plan: [place] will contact patient in the month of October and patient agrees to next outreach.   Lazaro Arms RN, BSN, [place] Direct Dial:  [phone]  Fax: [phone]

## 2019-07-06 ENCOUNTER — Other Ambulatory Visit: Payer: Self-pay | Admitting: *Deleted

## 2019-07-06 DIAGNOSIS — I6529 Occlusion and stenosis of unspecified carotid artery: Secondary | ICD-10-CM

## 2019-07-11 ENCOUNTER — Other Ambulatory Visit: Payer: Self-pay

## 2019-07-11 ENCOUNTER — Ambulatory Visit (INDEPENDENT_AMBULATORY_CARE_PROVIDER_SITE_OTHER): Payer: Medicare Other | Admitting: *Deleted

## 2019-07-11 DIAGNOSIS — Z5181 Encounter for therapeutic drug level monitoring: Secondary | ICD-10-CM | POA: Diagnosis not present

## 2019-07-11 DIAGNOSIS — I359 Nonrheumatic aortic valve disorder, unspecified: Secondary | ICD-10-CM

## 2019-07-11 DIAGNOSIS — Z952 Presence of prosthetic heart valve: Secondary | ICD-10-CM | POA: Diagnosis not present

## 2019-07-11 LAB — POCT INR: INR: 2.4 (ref 2.0–3.0)

## 2019-07-11 NOTE — Patient Instructions (Signed)
Description   Continue taking same dosage 1/2 tablet daily except 1 tablet on Tuesdays and Saturdays.  Repeat INR in 6 weeks.  Coumadin Clinic [phone]

## 2019-07-13 NOTE — Progress Notes (Signed)
HPI: FU coronary artery disease (s/p CABG), prior aortic valve replacement and aortic insufficiency. Last cardiac catheterization in 2011 revealed normal left main; 30% first diagonal, 50% LAD and 60% second diagonal. There was a 40-50% circumflex and a 40% right coronary artery. All grafts were occluded. Medical therapy recommended. Transesophageal echocardiogram in February of 2012 showed an ejection fraction of 45%. There was a [employer] Jude aortic valve with moderate perivalvular aortic insufficiency. There was moderate mitral regurgitation, moderate left atrial enlargement, mild right atrial enlargement and mild tricuspid regurgitation.Last echo March 2020 showed ejection fraction 25 to 30%, mild left ventricular enlargement, biatrial enlargement, moderate mitral regurgitation, moderate aortic insufficiency status post aortic valve replacement with mean gradient 15 mmHg. Dopplers August 2020 showed 40 to 59% right and 1 to 39% left stenosis.  Since she was last seen,she has dyspnea with more vigorous activities.  No orthopnea, PND, pedal edema, chest pain or syncope.  No bleeding.  Current Outpatient Medications  Medication Sig Dispense Refill  . acetaminophen (TYLENOL) 325 MG tablet Take 650 mg by mouth every 4 (four) hours as needed.    . ALPRAZolam (XANAX) 0.25 MG tablet Take 0.25 mg by mouth at bedtime as needed for anxiety.    Marland Kitchen atorvastatin (LIPITOR) 40 MG tablet Take 1 tablet (40 mg total) by mouth daily. 90 tablet 3  . baclofen (LIORESAL) 10 MG tablet Take 10 mg by mouth as needed for muscle spasms.    . carvedilol (COREG) 6.25 MG tablet Take 1 tablet (6.25 mg total) by mouth 2 (two) times daily with a meal. Keep scheduled appointment 60 tablet 0  . docusate sodium (COLACE) 100 MG capsule Take 100 mg by mouth at bedtime.     . donepezil (ARICEPT) 10 MG tablet TAKE ONE TABLET BY MOUTH ONCE DAILY. 30 tablet 0  . ergocalciferol (VITAMIN D2) 50000 UNITS capsule Take 50,000 Units by mouth  every Tuesday.     . ferrous sulfate 325 (65 FE) MG EC tablet Take 325 mg by mouth 2 (two) times daily.    . furosemide (LASIX) 20 MG tablet TAKE ONE TABLET DAILY AND TAKE AN EXTRA 1 TABLET AS NEEDED 180 tablet 3  . HYDROcodone-acetaminophen (NORCO/VICODIN) 5-325 MG tablet Take 1-2 tablets by mouth every 6 (six) hours as needed for moderate pain. 60 tablet 0  . hydrocortisone (ANUSOL-HC) 2.5 % rectal cream Place 1 application rectally 3 (three) times daily as needed for hemorrhoids or anal itching.    . levalbuterol (XOPENEX) 0.63 MG/3ML nebulizer solution Take 0.63 mg by nebulization every evening.    . meclizine (ANTIVERT) 25 MG tablet Take 25 mg by mouth 3 (three) times daily as needed for dizziness.    . Melatonin 3 MG TABS Take 1 tablet by mouth at bedtime.    . methylPREDNISolone (MEDROL DOSEPAK) 4 MG TBPK tablet     . multivitamin-lutein (OCUVITE-LUTEIN) CAPS capsule Take 1 capsule by mouth daily.    . ondansetron (ZOFRAN) 4 MG tablet Take 4 mg by mouth every 8 (eight) hours as needed for nausea or vomiting.    . polyethylene glycol (MIRALAX / GLYCOLAX) packet Take 17 g by mouth daily as needed.     . pregabalin (LYRICA) 75 MG capsule Take 1 capsule (75 mg total) by mouth every other day. 15 capsule 0  . spironolactone (ALDACTONE) 25 MG tablet Take 0.5 tablets (12.5 mg total) by mouth daily. 90 tablet 3  . traMADol (ULTRAM) 50 MG tablet Take 1 tablet (50  mg total) by mouth every 6 (six) hours as needed. 60 tablet 0  . umeclidinium-vilanterol (ANORO ELLIPTA) 62.5-25 MCG/INH AEPB Inhale 1 puff into the lungs daily.     Marland Kitchen. warfarin (COUMADIN) 2.5 MG tablet Take 2.5 mg by mouth every evening.    . warfarin (COUMADIN) 5 MG tablet TAKE 1/2 TO 1 TABLET ONCE DAILY AS DIRECTED BY COUMADIN CLINIC. 30 tablet 3   No current facility-administered medications for this visit.      Past Medical History:  Diagnosis Date  . Carotid artery stenosis    Without infarction  . Chronic combined systolic  (congestive) and diastolic (congestive) heart failure (HCC)   . COPD (chronic obstructive pulmonary disease) (HCC)   . Coronary artery disease   . Heart valve replaced by other means   . Hypercholesterolemia    Pure  . Hypertension    Unspecified  . LBBB (left bundle branch block)   . Macular degeneration (senile) of retina, unspecified   . On home O2   . Postsurgical aortocoronary bypass status   . Stroke (HCC)   . Transient global amnesia   . Unspecified hereditary and idiopathic peripheral neuropathy   . Unspecified vitamin D deficiency     Past Surgical History:  Procedure Laterality Date  . AORTIC VALVE REPLACEMENT    . APPENDECTOMY    . CORONARY ARTERY BYPASS GRAFT    . HIP PINNING,CANNULATED Right 07/18/2018   Procedure: RIGHT CANNULATED HIP PINNING;  Surgeon: Myrene GalasHandy, Michael, MD;  Location: [facility] OR;  Service: Orthopedics;  Laterality: Right;  . TONSILLECTOMY      Social History   Socioeconomic History  . Marital status: Widowed    Spouse name: Not on file  . Number of children: 2  . Years of education: 12th  . Highest education level: Not on file  Occupational History    Employer: RETIRED  Social Needs  . Financial resource strain: Not on file  . Food insecurity    Worry: Not on file    Inability: Not on file  . Transportation needs    Medical: Not on file    Non-medical: Not on file  Tobacco Use  . Smoking status: Former Smoker    Packs/day: 0.50    Types: Cigarettes    Quit date: 11/30/1991    Years since quitting: 27.6  . Smokeless tobacco: Never Used  . Tobacco comment: Tobacco use-no  Substance and Sexual Activity  . Alcohol use: No  . Drug use: No  . Sexual activity: Never  Lifestyle  . Physical activity    Days per week: Not on file    Minutes per session: Not on file  . Stress: Not on file  Relationships  . Social Musicianconnections    Talks on phone: Not on file    Gets together: Not on file    Attends religious service: Not on file    Active  member of club or organization: Not on file    Attends meetings of clubs or organizations: Not on file    Relationship status: Not on file  . Intimate partner violence    Fear of current or ex partner: Not on file    Emotionally abused: Not on file    Physically abused: Not on file    Forced sexual activity: Not on file  Other Topics Concern  . Not on file  Social History Narrative   Pt lives at home alone.   Caffeine Use: very rarely    Family History  Problem Relation Age of Onset  . Heart attack Father   . Stroke Sister   . Neuropathy Brother   . COPD Sister     ROS: no fevers or chills, productive cough, hemoptysis, dysphasia, odynophagia, melena, hematochezia, dysuria, hematuria, rash, seizure activity, orthopnea, PND, pedal edema, claudication. Remaining systems are negative.  Physical Exam: Well-developed well-nourished in no acute distress.  Skin is warm and dry.  HEENT is normal.  Neck is supple.  Chest is clear to auscultation with normal expansion.  Cardiovascular exam is regular rate and rhythm.  Crisp mechanical valve sound.  2/6 systolic and diastolic murmur left sternal border. Abdominal exam nontender or distended. No masses palpated. Extremities show no edema. neuro grossly intact  ECG-sinus rhythm, left bundle branch block.  Personally reviewed  A/P  1 chronic systolic congestive heart failure-patient is euvolemic today on examination.  Continue present dose of diuretic.  Check potassium and renal function.  Continue fluid restriction and low-sodium diet.  2 aortic valve replacement-continue Coumadin and SBE prophylaxis.  Note aspirin was discontinued previously because patient developed a thigh hematoma on the combination of Coumadin and aspirin.  Given patient's age conservative measures is appropriate.  3 cardiomyopathy-continue beta-blocker.  Blood pressure has improved.  We will try losartan 25 mg daily to see if she tolerates.  Check potassium and  renal function in 1 week.  4 coronary artery disease-patient is not having chest pain.  Continue statin.  No aspirin given need for Coumadin/history of thigh hematoma.  5 hypertension-patient's blood pressure is controlled.  Continue present medications and follow.  6 carotid artery disease-continue statin.  No aspirin given need for anticoagulation as outlined.  7 hyperlipidemia-continue statin.  Kirk Ruths, MD

## 2019-07-17 ENCOUNTER — Ambulatory Visit (INDEPENDENT_AMBULATORY_CARE_PROVIDER_SITE_OTHER): Payer: Medicare Other | Admitting: Cardiology

## 2019-07-17 ENCOUNTER — Encounter: Payer: Self-pay | Admitting: Cardiology

## 2019-07-17 ENCOUNTER — Other Ambulatory Visit: Payer: Self-pay

## 2019-07-17 VITALS — BP 144/52 | HR 54 | Temp 97.5°F | Ht 65.0 in | Wt 114.0 lb

## 2019-07-17 DIAGNOSIS — I10 Essential (primary) hypertension: Secondary | ICD-10-CM | POA: Diagnosis not present

## 2019-07-17 DIAGNOSIS — I6529 Occlusion and stenosis of unspecified carotid artery: Secondary | ICD-10-CM

## 2019-07-17 DIAGNOSIS — I420 Dilated cardiomyopathy: Secondary | ICD-10-CM

## 2019-07-17 DIAGNOSIS — I5022 Chronic systolic (congestive) heart failure: Secondary | ICD-10-CM

## 2019-07-17 DIAGNOSIS — Z952 Presence of prosthetic heart valve: Secondary | ICD-10-CM | POA: Diagnosis not present

## 2019-07-17 DIAGNOSIS — I2510 Atherosclerotic heart disease of native coronary artery without angina pectoris: Secondary | ICD-10-CM | POA: Diagnosis not present

## 2019-07-17 MED ORDER — LOSARTAN POTASSIUM 25 MG PO TABS: 25.0000 mg | ORAL_TABLET | Freq: Every day | ORAL | 3 refills | Status: DC

## 2019-07-17 NOTE — Patient Instructions (Signed)
Medication Instructions:  START LOSARTAN 25 MG ONCE DAILY  If you need a refill on your cardiac medications before your next appointment, please call your pharmacy.   Lab work: Your physician recommends that you return for lab work in: [place]  If you have labs (blood work) drawn today and your tests are completely normal, you will receive your results only by: Marland Kitchen MyChart Message (if you have MyChart) OR . A paper copy in the mail If you have any lab test that is abnormal or we need to change your treatment, we will call you to review the results.  Follow-Up: At [facility], you and your health needs are our priority.  As part of our continuing mission to provide you with exceptional heart care, we have created designated Provider Care Teams.  These Care Teams include your primary Cardiologist (physician) and Advanced Practice Providers (APPs -  Physician Assistants and Nurse Practitioners) who all work together to provide you with the care you need, when you need it. You will need a follow up appointment in 6 months.  Please call our office 2 months in advance to schedule this appointment.  You may see Kirk Ruths, MD or one of the following Advanced Practice Providers on your designated Care Team:   Kerin Ransom, PA-C Roby Lofts, [place] . Sande Rives, PA-C

## 2019-07-24 ENCOUNTER — Telehealth: Payer: Self-pay | Admitting: Cardiology

## 2019-07-24 DIAGNOSIS — H35052 Retinal neovascularization, unspecified, left eye: Secondary | ICD-10-CM | POA: Diagnosis not present

## 2019-07-24 DIAGNOSIS — H35352 Cystoid macular degeneration, left eye: Secondary | ICD-10-CM | POA: Diagnosis not present

## 2019-07-24 DIAGNOSIS — H353221 Exudative age-related macular degeneration, left eye, with active choroidal neovascularization: Secondary | ICD-10-CM | POA: Diagnosis not present

## 2019-07-24 NOTE — Telephone Encounter (Signed)
DC losartan. Kirk Ruths

## 2019-07-24 NOTE — Telephone Encounter (Signed)
Forward to Dr Stanford Breed for review

## 2019-07-24 NOTE — Telephone Encounter (Signed)
  Pt c/o BP issue: STAT if pt c/o blurred vision, one-sided weakness or slurred speech  1. What are your last 5 BP readings?   9/1  4pm   116/50          9pm 75/37 p 61 9/2  10am 117/60 P 60  4pm  110/50 p60   9:30pm 81/39 p 61 9/3  11am 110/51           4pm  115/55          9:30pm 115/50 p 55 9/4  2pm   121/49 p 60   7pm  106/46 p 62  9:30pm 83/34 p 56 9/5  11:45am 115/52      6:30pm  116/35     9:30pm  124/54   2. Are you having any other symptoms (ex. Dizziness, headache, blurred vision, passed out)? NA  3. What is your BP issue? Patient was asked to call with BP readings. Patient has been napping more during the day and little more wobbly when walking. Please call [name]

## 2019-07-25 ENCOUNTER — Other Ambulatory Visit: Payer: Self-pay | Admitting: Cardiology

## 2019-07-25 DIAGNOSIS — I420 Dilated cardiomyopathy: Secondary | ICD-10-CM | POA: Diagnosis not present

## 2019-07-25 NOTE — Telephone Encounter (Signed)
Spoke to [employer] -  instruction given per Dr Stanford Breed. [name] verbalized understanding.  she states patient will going to the lab tomorrow in [place] to do labwork  Request a copy be sent of results when complete - RN INFORMED Ms Eulas Post to remind whose calling to do that.

## 2019-07-26 LAB — BASIC METABOLIC PANEL
BUN/Creatinine Ratio: 21 (ref 12–28)
BUN: 27 mg/dL (ref 10–36)
CO2: 21 mmol/L (ref 20–29)
Calcium: 9.5 mg/dL (ref 8.7–10.3)
Chloride: 110 mmol/L — ABNORMAL HIGH (ref 96–106)
Creatinine, Ser: 1.3 mg/dL — ABNORMAL HIGH (ref 0.57–1.00)
GFR calc Af Amer: 42 mL/min/{1.73_m2} — ABNORMAL LOW (ref >59)
GFR calc non Af Amer: 36 mL/min/{1.73_m2} — ABNORMAL LOW (ref >59)
Glucose: 106 mg/dL — ABNORMAL HIGH (ref 65–99)
Potassium: 4.7 mmol/L (ref 3.5–5.2)
Sodium: 144 mmol/L (ref 134–144)

## 2019-07-31 NOTE — Telephone Encounter (Signed)
This encounter was created in error - please disregard.

## 2019-08-01 ENCOUNTER — Telehealth: Payer: Self-pay | Admitting: Cardiology

## 2019-08-01 NOTE — Telephone Encounter (Signed)
New message   Patient's daughter wants to know results of blood work. Please call.

## 2019-08-01 NOTE — Telephone Encounter (Signed)
Called patient, and spoke to daughter per DPR-  Advised of blood work.  No questions, they did ask that the blood work be printed and mailed.

## 2019-08-01 NOTE — Telephone Encounter (Signed)
Labs have been mailed.

## 2019-08-08 ENCOUNTER — Other Ambulatory Visit: Payer: Self-pay | Admitting: Cardiology

## 2019-08-22 ENCOUNTER — Other Ambulatory Visit: Payer: Self-pay

## 2019-08-22 ENCOUNTER — Ambulatory Visit (INDEPENDENT_AMBULATORY_CARE_PROVIDER_SITE_OTHER): Payer: Medicare Other | Admitting: *Deleted

## 2019-08-22 DIAGNOSIS — Z952 Presence of prosthetic heart valve: Secondary | ICD-10-CM

## 2019-08-22 DIAGNOSIS — Z5181 Encounter for therapeutic drug level monitoring: Secondary | ICD-10-CM

## 2019-08-22 DIAGNOSIS — I359 Nonrheumatic aortic valve disorder, unspecified: Secondary | ICD-10-CM

## 2019-08-22 LAB — POCT INR: INR: 3 (ref 2.0–3.0)

## 2019-08-22 NOTE — Patient Instructions (Signed)
Description   Continue taking same dosage 1/2 tablet daily except 1 tablet on Tuesdays and Saturdays.  Repeat INR in 6 weeks.  [facility] [phone]

## 2019-08-23 DIAGNOSIS — H353221 Exudative age-related macular degeneration, left eye, with active choroidal neovascularization: Secondary | ICD-10-CM | POA: Diagnosis not present

## 2019-08-23 DIAGNOSIS — H35352 Cystoid macular degeneration, left eye: Secondary | ICD-10-CM | POA: Diagnosis not present

## 2019-08-23 DIAGNOSIS — H353112 Nonexudative age-related macular degeneration, right eye, intermediate dry stage: Secondary | ICD-10-CM | POA: Diagnosis not present

## 2019-08-28 DIAGNOSIS — I5020 Unspecified systolic (congestive) heart failure: Secondary | ICD-10-CM | POA: Diagnosis not present

## 2019-08-28 DIAGNOSIS — Z23 Encounter for immunization: Secondary | ICD-10-CM | POA: Diagnosis not present

## 2019-08-28 DIAGNOSIS — J449 Chronic obstructive pulmonary disease, unspecified: Secondary | ICD-10-CM | POA: Diagnosis not present

## 2019-08-28 DIAGNOSIS — I10 Essential (primary) hypertension: Secondary | ICD-10-CM | POA: Diagnosis not present

## 2019-08-28 DIAGNOSIS — J9611 Chronic respiratory failure with hypoxia: Secondary | ICD-10-CM | POA: Diagnosis not present

## 2019-08-29 DIAGNOSIS — J449 Chronic obstructive pulmonary disease, unspecified: Secondary | ICD-10-CM | POA: Diagnosis not present

## 2019-08-29 DIAGNOSIS — J9611 Chronic respiratory failure with hypoxia: Secondary | ICD-10-CM | POA: Diagnosis not present

## 2019-08-29 DIAGNOSIS — I5020 Unspecified systolic (congestive) heart failure: Secondary | ICD-10-CM | POA: Diagnosis not present

## 2019-08-29 DIAGNOSIS — I10 Essential (primary) hypertension: Secondary | ICD-10-CM | POA: Diagnosis not present

## 2019-09-03 ENCOUNTER — Ambulatory Visit (INDEPENDENT_AMBULATORY_CARE_PROVIDER_SITE_OTHER): Payer: Medicare Other | Admitting: Otolaryngology

## 2019-09-03 ENCOUNTER — Other Ambulatory Visit: Payer: Self-pay

## 2019-09-03 DIAGNOSIS — H903 Sensorineural hearing loss, bilateral: Secondary | ICD-10-CM | POA: Diagnosis not present

## 2019-09-04 ENCOUNTER — Other Ambulatory Visit: Payer: Self-pay

## 2019-09-04 NOTE — Patient Outreach (Signed)
[facility]) Care Management  09/04/2019   COUTNEY WILDERMUTH [birth year] [id number]  Subjective: Successful outreach to the caregiver ( on ROI) .  Two patient  Identifiers given.  [name] states that the patient has been doing well.  She reports that at her last appointment she was given a good report.  She is weighing daily but has gone up in weight.  She weighs 113 lbs. She is usually around 110 lbs. She denies her having any shortness of breath chest pain or swelling.  [name] feels that she has gained a little weight from eating better.  She has a live in with her. [name] states that PCP gave her a guideline that if she gains up to 115 lbs she can take an extra lasix.  [name] states that her mother has a mini exercise bike that she can use while she is sitting. [name] reports that the patient got her flu shot on 10/13 at her last appointment.  [name] was notified on the call that I will be transitioning to a new position.   Current Medications:  Current Outpatient Medications  Medication Sig Dispense Refill  . acetaminophen (TYLENOL) 325 MG tablet Take 650 mg by mouth every 4 (four) hours as needed.    . ALPRAZolam (XANAX) 0.25 MG tablet Take 0.25 mg by mouth at bedtime as needed for anxiety.    Marland Kitchen atorvastatin (LIPITOR) 40 MG tablet Take 1 tablet (40 mg total) by mouth daily. 90 tablet 3  . baclofen (LIORESAL) 10 MG tablet Take 10 mg by mouth as needed for muscle spasms.    . carvedilol (COREG) 6.25 MG tablet Take 1 tablet (6.25 mg total) by mouth 2 (two) times daily with a meal. Keep scheduled appointment 60 tablet 0  . docusate sodium (COLACE) 100 MG capsule Take 100 mg by mouth at bedtime.     . donepezil (ARICEPT) 10 MG tablet TAKE ONE TABLET BY MOUTH ONCE DAILY. 30 tablet 0  . ergocalciferol (VITAMIN D2) 50000 UNITS capsule Take 50,000 Units by mouth every Tuesday.     . ferrous sulfate 325 (65 FE) MG EC tablet Take 325 mg by mouth 2 (two) times daily.    . furosemide (LASIX)  20 MG tablet TAKE ONE TABLET DAILY AND TAKE AN EXTRA 1 TABLET AS NEEDED 180 tablet 3  . HYDROcodone-acetaminophen (NORCO/VICODIN) 5-325 MG tablet Take 1-2 tablets by mouth every 6 (six) hours as needed for moderate pain. 60 tablet 0  . hydrocortisone (ANUSOL-HC) 2.5 % rectal cream Place 1 application rectally 3 (three) times daily as needed for hemorrhoids or anal itching.    . levalbuterol (XOPENEX) 0.63 MG/3ML nebulizer solution Take 0.63 mg by nebulization every evening.    . meclizine (ANTIVERT) 25 MG tablet Take 25 mg by mouth 3 (three) times daily as needed for dizziness.    . Melatonin 3 MG TABS Take 1 tablet by mouth at bedtime.    . multivitamin-lutein (OCUVITE-LUTEIN) CAPS capsule Take 1 capsule by mouth daily.    . ondansetron (ZOFRAN) 4 MG tablet Take 4 mg by mouth every 8 (eight) hours as needed for nausea or vomiting.    . polyethylene glycol (MIRALAX / GLYCOLAX) packet Take 17 g by mouth daily as needed.     . pregabalin (LYRICA) 75 MG capsule Take 1 capsule (75 mg total) by mouth every other day. 15 capsule 0  . spironolactone (ALDACTONE) 25 MG tablet TAKE (1/2) TABLET BY MOUTH DAILY. 45 tablet 0  . traMADol (ULTRAM)  50 MG tablet Take 1 tablet (50 mg total) by mouth every 6 (six) hours as needed. 60 tablet 0  . umeclidinium-vilanterol (ANORO ELLIPTA) 62.5-25 MCG/INH AEPB Inhale 1 puff into the lungs daily.     Marland Kitchen warfarin (COUMADIN) 2.5 MG tablet Take 2.5 mg by mouth every evening.    . warfarin (COUMADIN) 5 MG tablet TAKE 1/2 TO 1 TABLET ONCE DAILY AS DIRECTED BY COUMADIN CLINIC. 30 tablet 3  . methylPREDNISolone (MEDROL DOSEPAK) 4 MG TBPK tablet      No current facility-administered medications for this visit.     Functional Status:  In your present state of health, do you have any difficulty performing the following activities: 04/03/2019 09/05/2018  Hearing? Y Y  Comment one hearing aid -  Vision? N N  Difficulty concentrating or making decisions? N N  Walking or climbing  stairs? Y Y  Comment hip problems Post ortho surgery.  Dressing or bathing? N N  Doing errands, shopping? N Y  Conservation officer, nature and eating ? - N  Using the Toilet? - N  In the past six months, have you accidently leaked urine? - Y  Comment - Wears adult briefs.  Do you have problems with loss of bowel control? - N  Managing your Medications? - Y  Comment - Patient reported daughter prepares medications.  Managing your Finances? - Y  Comment - Daughter is POC.  Housekeeping or managing your Housekeeping? - Y  Comment - In home caregiver.  Some recent data might be hidden    Fall/Depression Screening: Fall Risk  09/04/2019 07/05/2019 05/09/2019  Falls in the past year? 0 0 0  Number falls in past yr: - - -  Injury with Fall? - - -  Risk Factor Category  - - -  Risk for fall due to : - - -  Risk for fall due to: Comment - - -  Follow up - - -   PHQ 2/9 Scores 04/03/2019 03/08/2019 11/10/2018 09/26/2018 09/05/2018  PHQ - 2 Score 2 0 0 0 0  PHQ- 9 Score 5 - - - -    Assessment: Patient will continue to benefit from health coach outreach for disease management and support. [facility] CM Care Plan Problem One     Most Recent Value  [facility] Long Term Goal   Patient will not be hospitalized over the next 60 days. for CHF  [facility] Long Term Goal Start Date  09/04/19  Interventions for Problem One Long Term Goal  Discussed diet and weight gain ,  encouraged weighing daily, encouraged medication adherence, encouraged continued exercise        Plan: RN Health Coach will contact patient in the month of December and patient agrees to next outreach.   Lazaro Arms RN, BSN, [place] Direct Dial:  [phone]  Fax: [phone]

## 2019-09-11 ENCOUNTER — Ambulatory Visit (INDEPENDENT_AMBULATORY_CARE_PROVIDER_SITE_OTHER): Payer: Medicare Other | Admitting: Orthopaedic Surgery

## 2019-09-11 ENCOUNTER — Other Ambulatory Visit: Payer: Self-pay

## 2019-09-11 ENCOUNTER — Encounter: Payer: Self-pay | Admitting: Orthopaedic Surgery

## 2019-09-11 VITALS — BP 125/65 | HR 57 | Ht 65.0 in | Wt 119.0 lb

## 2019-09-11 DIAGNOSIS — M792 Neuralgia and neuritis, unspecified: Secondary | ICD-10-CM

## 2019-09-11 DIAGNOSIS — I6529 Occlusion and stenosis of unspecified carotid artery: Secondary | ICD-10-CM | POA: Diagnosis not present

## 2019-09-11 DIAGNOSIS — G629 Polyneuropathy, unspecified: Secondary | ICD-10-CM | POA: Diagnosis not present

## 2019-09-11 NOTE — Progress Notes (Signed)
Subjective:    Patient ID: [name], female    DOB: [birth year], 85 y.o.   MRN: [id number]  HPI She has long history of peripheral neuropathy of the feet.  I had seen her several years ago in 2015.  I had her on Lyrica 75 po every other day. Then she went to see Dr. [name] and he has maintained her on this.  He is retiring end of December of this year.  She wants me to continue the medicine.  I have no problem. She has some days where she has more pain in the feet but does well over all.  Her daughter accompanies her today.    Review of Systems  Constitutional: Positive for activity change.  Musculoskeletal: Positive for arthralgias and gait problem.  All other systems reviewed and are negative.  For Review of Systems, all other systems reviewed and are negative.  The following is a summary of the past history medically, past history surgically, known current medicines, social history and family history.  This information is gathered electronically by the computer from prior information and documentation.  I review this each visit and have found including this information at this point in the chart is beneficial and informative.   Past Medical History:  Diagnosis Date  . Carotid artery stenosis    Without infarction  . Chronic combined systolic (congestive) and diastolic (congestive) heart failure (HCC)   . COPD (chronic obstructive pulmonary disease) (HCC)   . Coronary artery disease   . Heart valve replaced by other means   . Hypercholesterolemia    Pure  . Hypertension    Unspecified  . LBBB (left bundle branch block)   . Macular degeneration (senile) of retina, unspecified   . On home O2   . Postsurgical aortocoronary bypass status   . Stroke (HCC)   . Transient global amnesia   . Unspecified hereditary and idiopathic peripheral neuropathy   . Unspecified vitamin D deficiency     Past Surgical History:  Procedure Laterality Date  . AORTIC VALVE REPLACEMENT    .  APPENDECTOMY    . CORONARY ARTERY BYPASS GRAFT    . HIP PINNING,CANNULATED Right 07/18/2018   Procedure: RIGHT CANNULATED HIP PINNING;  Surgeon: Myrene Galas, MD;  Location: [facility] OR;  Service: Orthopedics;  Laterality: Right;  . TONSILLECTOMY      Current Outpatient Medications on File Prior to Visit  Medication Sig Dispense Refill  . acetaminophen (TYLENOL) 325 MG tablet Take 650 mg by mouth every 4 (four) hours as needed.    . ALPRAZolam (XANAX) 0.25 MG tablet Take 0.25 mg by mouth at bedtime as needed for anxiety.    Marland Kitchen atorvastatin (LIPITOR) 40 MG tablet Take 1 tablet (40 mg total) by mouth daily. 90 tablet 3  . baclofen (LIORESAL) 10 MG tablet Take 10 mg by mouth as needed for muscle spasms.    . carvedilol (COREG) 6.25 MG tablet Take 1 tablet (6.25 mg total) by mouth 2 (two) times daily with a meal. Keep scheduled appointment 60 tablet 0  . docusate sodium (COLACE) 100 MG capsule Take 100 mg by mouth at bedtime.     . donepezil (ARICEPT) 10 MG tablet TAKE ONE TABLET BY MOUTH ONCE DAILY. 30 tablet 0  . ergocalciferol (VITAMIN D2) 50000 UNITS capsule Take 50,000 Units by mouth every Tuesday.     . ferrous sulfate 325 (65 FE) MG EC tablet Take 325 mg by mouth 2 (two) times daily.    Marland Kitchen  furosemide (LASIX) 20 MG tablet TAKE ONE TABLET DAILY AND TAKE AN EXTRA 1 TABLET AS NEEDED 180 tablet 3  . HYDROcodone-acetaminophen (NORCO/VICODIN) 5-325 MG tablet Take 1-2 tablets by mouth every 6 (six) hours as needed for moderate pain. 60 tablet 0  . hydrocortisone (ANUSOL-HC) 2.5 % rectal cream Place 1 application rectally 3 (three) times daily as needed for hemorrhoids or anal itching.    . levalbuterol (XOPENEX) 0.63 MG/3ML nebulizer solution Take 0.63 mg by nebulization every evening.    . meclizine (ANTIVERT) 25 MG tablet Take 25 mg by mouth 3 (three) times daily as needed for dizziness.    . Melatonin 3 MG TABS Take 1 tablet by mouth at bedtime.    . methylPREDNISolone (MEDROL DOSEPAK) 4 MG TBPK tablet      . multivitamin-lutein (OCUVITE-LUTEIN) CAPS capsule Take 1 capsule by mouth daily.    . ondansetron (ZOFRAN) 4 MG tablet Take 4 mg by mouth every 8 (eight) hours as needed for nausea or vomiting.    . polyethylene glycol (MIRALAX / GLYCOLAX) packet Take 17 g by mouth daily as needed.     . pregabalin (LYRICA) 75 MG capsule Take 1 capsule (75 mg total) by mouth every other day. 15 capsule 0  . spironolactone (ALDACTONE) 25 MG tablet TAKE (1/2) TABLET BY MOUTH DAILY. 45 tablet 0  . traMADol (ULTRAM) 50 MG tablet Take 1 tablet (50 mg total) by mouth every 6 (six) hours as needed. 60 tablet 0  . umeclidinium-vilanterol (ANORO ELLIPTA) 62.5-25 MCG/INH AEPB Inhale 1 puff into the lungs daily.     Marland Kitchen warfarin (COUMADIN) 2.5 MG tablet Take 2.5 mg by mouth every evening.    . warfarin (COUMADIN) 5 MG tablet TAKE 1/2 TO 1 TABLET ONCE DAILY AS DIRECTED BY COUMADIN CLINIC. 30 tablet 3   No current facility-administered medications on file prior to visit.     Social History   Socioeconomic History  . Marital status: Widowed    Spouse name: Not on file  . Number of children: 2  . Years of education: 12th  . Highest education level: Not on file  Occupational History    Employer: RETIRED  Social Needs  . Financial resource strain: Not on file  . Food insecurity    Worry: Not on file    Inability: Not on file  . Transportation needs    Medical: Not on file    Non-medical: Not on file  Tobacco Use  . Smoking status: Former Smoker    Packs/day: 0.50    Types: Cigarettes    Quit date: 11/30/1991    Years since quitting: 27.8  . Smokeless tobacco: Never Used  . Tobacco comment: Tobacco use-no  Substance and Sexual Activity  . Alcohol use: No  . Drug use: No  . Sexual activity: Never  Lifestyle  . Physical activity    Days per week: Not on file    Minutes per session: Not on file  . Stress: Not on file  Relationships  . Social Herbalist on phone: Not on file    Gets  together: Not on file    Attends religious service: Not on file    Active member of club or organization: Not on file    Attends meetings of clubs or organizations: Not on file    Relationship status: Not on file  . Intimate partner violence    Fear of current or ex partner: Not on file    Emotionally abused:  Not on file    Physically abused: Not on file    Forced sexual activity: Not on file  Other Topics Concern  . Not on file  Social History Narrative   Pt lives at home alone.   Caffeine Use: very rarely    Family History  Problem Relation Age of Onset  . Heart attack Father   . Stroke Sister   . Neuropathy Brother   . COPD Sister     BP 125/65   Pulse (!) 57   Ht 5\' 5"  (1.651 m)   Wt 119 lb (54 kg)   BMI 19.80 kg/m   Body mass index is 19.8 kg/m.     Objective:   Physical Exam Vitals signs reviewed.  Constitutional:      Appearance: She is well-developed.  HENT:     Head: Normocephalic and atraumatic.  Eyes:     Conjunctiva/sclera: Conjunctivae normal.     Pupils: Pupils are equal, round, and reactive to light.  Neck:     Musculoskeletal: Normal range of motion and neck supple.  Cardiovascular:     Rate and Rhythm: Normal rate and regular rhythm.  Pulmonary:     Effort: Pulmonary effort is normal.  Abdominal:     Palpations: Abdomen is soft.  Musculoskeletal:       Feet:  Skin:    General: Skin is warm and dry.  Neurological:     Mental Status: She is alert and oriented to person, place, and time.     Cranial Nerves: No cranial nerve deficit.     Motor: No abnormal muscle tone.     Coordination: Coordination normal.     Deep Tendon Reflexes: Reflexes are normal and symmetric. Reflexes normal.  Psychiatric:        Behavior: Behavior normal.        Thought Content: Thought content normal.        Judgment: Judgment normal.           Assessment & Plan:   Encounter Diagnosis  Name Primary?  . Peripheral neuropathy due to inflammation Yes    I will give the Lyrica 75 every other day as needed.  I will see her in one year.  She can do telehealth visit if she desires.  Call if any problem.  Precautions discussed.   Electronically Signed Darreld McleanWayne Criselda Starke, MD 10/27/202011:04 AM

## 2019-09-22 ENCOUNTER — Other Ambulatory Visit: Payer: Self-pay | Admitting: Cardiology

## 2019-09-27 DIAGNOSIS — H35721 Serous detachment of retinal pigment epithelium, right eye: Secondary | ICD-10-CM | POA: Diagnosis not present

## 2019-09-27 DIAGNOSIS — H353112 Nonexudative age-related macular degeneration, right eye, intermediate dry stage: Secondary | ICD-10-CM | POA: Diagnosis not present

## 2019-09-27 DIAGNOSIS — H353221 Exudative age-related macular degeneration, left eye, with active choroidal neovascularization: Secondary | ICD-10-CM | POA: Diagnosis not present

## 2019-09-27 DIAGNOSIS — H353212 Exudative age-related macular degeneration, right eye, with inactive choroidal neovascularization: Secondary | ICD-10-CM | POA: Diagnosis not present

## 2019-10-02 ENCOUNTER — Other Ambulatory Visit: Payer: Self-pay

## 2019-10-02 ENCOUNTER — Ambulatory Visit (INDEPENDENT_AMBULATORY_CARE_PROVIDER_SITE_OTHER): Payer: Medicare Other | Admitting: *Deleted

## 2019-10-02 DIAGNOSIS — Z5181 Encounter for therapeutic drug level monitoring: Secondary | ICD-10-CM

## 2019-10-02 DIAGNOSIS — I359 Nonrheumatic aortic valve disorder, unspecified: Secondary | ICD-10-CM | POA: Diagnosis not present

## 2019-10-02 DIAGNOSIS — Z952 Presence of prosthetic heart valve: Secondary | ICD-10-CM | POA: Diagnosis not present

## 2019-10-02 LAB — POCT INR: INR: 2.4 (ref 2.0–3.0)

## 2019-10-02 NOTE — Patient Instructions (Signed)
Description   Continue taking same dosage 1/2 tablet daily except 1 tablet on Tuesdays and Saturdays.  Repeat INR in 7 weeks.  [facility] [phone]

## 2019-10-20 ENCOUNTER — Other Ambulatory Visit: Payer: Self-pay

## 2019-10-20 ENCOUNTER — Ambulatory Visit (HOSPITAL_COMMUNITY)
Admission: EM | Admit: 2019-10-20 | Discharge: 2019-10-20 | Disposition: A | Discharge status: Home / Self Care | Payer: Medicare Other | Attending: Family Medicine | Admitting: Family Medicine

## 2019-10-20 ENCOUNTER — Ambulatory Visit (INDEPENDENT_AMBULATORY_CARE_PROVIDER_SITE_OTHER): Payer: Medicare Other

## 2019-10-20 ENCOUNTER — Encounter (HOSPITAL_COMMUNITY): Payer: Self-pay | Admitting: Emergency Medicine

## 2019-10-20 DIAGNOSIS — Z823 Family history of stroke: Secondary | ICD-10-CM | POA: Diagnosis not present

## 2019-10-20 DIAGNOSIS — Z79899 Other long term (current) drug therapy: Secondary | ICD-10-CM | POA: Insufficient documentation

## 2019-10-20 DIAGNOSIS — I130 Hypertensive heart and chronic kidney disease with heart failure and stage 1 through stage 4 chronic kidney disease, or unspecified chronic kidney disease: Secondary | ICD-10-CM | POA: Insufficient documentation

## 2019-10-20 DIAGNOSIS — N183 Chronic kidney disease, stage 3 unspecified: Secondary | ICD-10-CM | POA: Insufficient documentation

## 2019-10-20 DIAGNOSIS — U071 COVID-19: Secondary | ICD-10-CM | POA: Diagnosis not present

## 2019-10-20 DIAGNOSIS — Z952 Presence of prosthetic heart valve: Secondary | ICD-10-CM | POA: Diagnosis not present

## 2019-10-20 DIAGNOSIS — I5042 Chronic combined systolic (congestive) and diastolic (congestive) heart failure: Secondary | ICD-10-CM | POA: Diagnosis not present

## 2019-10-20 DIAGNOSIS — Z951 Presence of aortocoronary bypass graft: Secondary | ICD-10-CM | POA: Insufficient documentation

## 2019-10-20 DIAGNOSIS — J441 Chronic obstructive pulmonary disease with (acute) exacerbation: Secondary | ICD-10-CM | POA: Diagnosis not present

## 2019-10-20 DIAGNOSIS — Z87891 Personal history of nicotine dependence: Secondary | ICD-10-CM | POA: Insufficient documentation

## 2019-10-20 DIAGNOSIS — F0390 Unspecified dementia without behavioral disturbance: Secondary | ICD-10-CM | POA: Diagnosis not present

## 2019-10-20 DIAGNOSIS — E7800 Pure hypercholesterolemia, unspecified: Secondary | ICD-10-CM | POA: Insufficient documentation

## 2019-10-20 DIAGNOSIS — Z825 Family history of asthma and other chronic lower respiratory diseases: Secondary | ICD-10-CM | POA: Insufficient documentation

## 2019-10-20 DIAGNOSIS — Z8673 Personal history of transient ischemic attack (TIA), and cerebral infarction without residual deficits: Secondary | ICD-10-CM | POA: Insufficient documentation

## 2019-10-20 DIAGNOSIS — Z8249 Family history of ischemic heart disease and other diseases of the circulatory system: Secondary | ICD-10-CM | POA: Diagnosis not present

## 2019-10-20 DIAGNOSIS — Z7901 Long term (current) use of anticoagulants: Secondary | ICD-10-CM | POA: Diagnosis not present

## 2019-10-20 DIAGNOSIS — R05 Cough: Secondary | ICD-10-CM | POA: Diagnosis not present

## 2019-10-20 LAB — POCT URINALYSIS DIP (DEVICE)
Bilirubin Urine: NEGATIVE
Glucose, UA: NEGATIVE mg/dL
Ketones, ur: NEGATIVE mg/dL
Leukocytes,Ua: NEGATIVE
Nitrite: NEGATIVE
Protein, ur: NEGATIVE mg/dL
Specific Gravity, Urine: 1.02 (ref 1.005–1.030)
Urobilinogen, UA: 0.2 mg/dL (ref 0.0–1.0)
pH: 5.5 (ref 5.0–8.0)

## 2019-10-20 MED ORDER — PREDNISONE 50 MG PO TABS: ORAL_TABLET | ORAL | 0 refills | Status: DC

## 2019-10-20 MED ORDER — DOXYCYCLINE HYCLATE 100 MG PO TABS: 100.0000 mg | ORAL_TABLET | Freq: Two times a day (BID) | ORAL | 0 refills | Status: DC

## 2019-10-20 NOTE — ED Provider Notes (Signed)
[facility]    CSN: [id number] Arrival date & time: 10/20/19  1054      History   Chief Complaint Chief Complaint  Patient presents with  . Cough    HPI JACHELLE FLUTY is a 85 y.o. female.   She is presenting with an elevated temperature last night.  She has a history of heart failure as well as urinary infection.  She denies any symptoms today.  She is not had any cough.  She has not gone anywhere other than around her family members.  They have concerned that she may have Covid.  Her caregiver is being tested for Covid currently.  HPI  Past Medical History:  Diagnosis Date  . Carotid artery stenosis    Without infarction  . Chronic combined systolic (congestive) and diastolic (congestive) heart failure (HCC)   . COPD (chronic obstructive pulmonary disease) (HCC)   . Coronary artery disease   . Heart valve replaced by other means   . Hypercholesterolemia    Pure  . Hypertension    Unspecified  . LBBB (left bundle branch block)   . Macular degeneration (senile) of retina, unspecified   . On home O2   . Postsurgical aortocoronary bypass status   . Stroke (HCC)   . Transient global amnesia   . Unspecified hereditary and idiopathic peripheral neuropathy   . Unspecified vitamin D deficiency     Patient Active Problem List   Diagnosis Date Noted  . Hematoma of thigh, right, initial encounter 07/23/2018  . Chronic combined systolic and diastolic CHF (congestive heart failure) (HCC) 07/23/2018  . Supratherapeutic INR 07/23/2018  . Acute blood loss anemia 07/23/2018  . CKD (chronic kidney disease) stage 3, GFR 30-59 ml/min 07/23/2018  . COPD (chronic obstructive pulmonary disease) (HCC) 07/23/2018  . Hematoma of right thigh 07/23/2018  . Pressure injury of skin 07/23/2018  . Protein-calorie malnutrition, severe 07/18/2018  . Hip fracture (HCC) 07/16/2018  . COPD exacerbation (HCC) 07/01/2018  . Right hip pain 07/01/2018  . Chronic respiratory failure with  hypoxia, on home O2 therapy (HCC) 07/01/2018  . CHF (congestive heart failure) (HCC) 06/28/2018  . Acute diastolic heart failure (HCC) 06/27/2018  . Anemia 06/27/2018  . Congestive dilated cardiomyopathy (HCC) 09/29/2015  . Encounter for therapeutic drug monitoring 12/12/2013  . Transient confusion 04/16/2013  . Mild malnutrition (HCC) 11/28/2012  . Dehydration 11/27/2012  . Dementia (HCC) 11/27/2012  . Pneumonia 11/24/2012  . Aortic valve disorder 01/13/2011  . Carotid artery stenosis 10/13/2009  . S/P AVR (aortic valve replacement) 10/13/2009  . Hyperlipidemia 03/06/2009  . Essential hypertension 03/06/2009  . CAD, ARTERY BYPASS GRAFT 03/06/2009  . LBBB 03/06/2009  . CVA 03/06/2009    Past Surgical History:  Procedure Laterality Date  . AORTIC VALVE REPLACEMENT    . APPENDECTOMY    . CORONARY ARTERY BYPASS GRAFT    . HIP PINNING,CANNULATED Right 07/18/2018   Procedure: RIGHT CANNULATED HIP PINNING;  Surgeon: Myrene Galas, MD;  Location: [facility] OR;  Service: Orthopedics;  Laterality: Right;  . TONSILLECTOMY      OB History   No obstetric history on file.      Home Medications    Prior to Admission medications   Medication Sig Start Date End Date Taking? Authorizing Provider  ALPRAZolam Prudy Feeler) 0.25 MG tablet Take 0.25 mg by mouth at bedtime as needed for anxiety.   Yes [provider]  atorvastatin (LIPITOR) 40 MG tablet Take 1 tablet (40 mg total) by mouth daily.  12/14/18  Yes Lelon Perla, MD  carvedilol (COREG) 6.25 MG tablet Take 1 tablet (6.25 mg total) by mouth 2 (two) times daily with a meal. Keep scheduled appointment 07/09/19  Yes Lelon Perla, MD  donepezil (ARICEPT) 10 MG tablet TAKE ONE TABLET BY MOUTH ONCE DAILY. 08/12/15  Yes Penumalli, Earlean Polka, MD  ergocalciferol (VITAMIN D2) 50000 UNITS capsule Take 50,000 Units by mouth every Tuesday.    Yes [provider]  ferrous sulfate 325 (65 FE) MG EC tablet Take 325 mg by mouth 2 (two)  times daily.   Yes [provider]  furosemide (LASIX) 20 MG tablet TAKE ONE TABLET BY MOUTH ONCE DAILY.TAKE EXTRA IF NEEDED. 09/24/19  Yes Crenshaw, Denice Bors, MD  levalbuterol Penne Lash) 0.63 MG/3ML nebulizer solution Take 0.63 mg by nebulization every evening.   Yes [provider]  multivitamin-lutein (OCUVITE-LUTEIN) CAPS capsule Take 1 capsule by mouth daily.   Yes [provider]  pregabalin (LYRICA) 75 MG capsule Take 1 capsule (75 mg total) by mouth every other day. 08/02/18  Yes Lassen, Arlo C, PA-C  spironolactone (ALDACTONE) 25 MG tablet TAKE (1/2) TABLET BY MOUTH DAILY. 08/09/19  Yes Lelon Perla, MD  umeclidinium-vilanterol (ANORO ELLIPTA) 62.5-25 MCG/INH AEPB Inhale 1 puff into the lungs daily.    Yes [provider]  acetaminophen (TYLENOL) 325 MG tablet Take 650 mg by mouth every 4 (four) hours as needed.    [provider]  baclofen (LIORESAL) 10 MG tablet Take 10 mg by mouth as needed for muscle spasms.    [provider]  docusate sodium (COLACE) 100 MG capsule Take 100 mg by mouth at bedtime.     [provider]  doxycycline (VIBRA-TABS) 100 MG tablet Take 1 tablet (100 mg total) by mouth 2 (two) times daily for 7 days. 10/20/19 10/27/19  Rosemarie Ax, MD  HYDROcodone-acetaminophen (NORCO/VICODIN) 5-325 MG tablet Take 1-2 tablets by mouth every 6 (six) hours as needed for moderate pain. 08/11/18   Wille Celeste, PA-C  hydrocortisone (ANUSOL-HC) 2.5 % rectal cream Place 1 application rectally 3 (three) times daily as needed for hemorrhoids or anal itching.    [provider]  meclizine (ANTIVERT) 25 MG tablet Take 25 mg by mouth 3 (three) times daily as needed for dizziness.    [provider]  Melatonin 3 MG TABS Take 1 tablet by mouth at bedtime.    [provider]  methylPREDNISolone (MEDROL DOSEPAK) 4 MG TBPK tablet  01/16/19   [provider]  ondansetron (ZOFRAN) 4 MG tablet  Take 4 mg by mouth every 8 (eight) hours as needed for nausea or vomiting.    [provider]  polyethylene glycol (MIRALAX / GLYCOLAX) packet Take 17 g by mouth daily as needed.     [provider]  predniSONE (DELTASONE) 50 MG tablet One tab PO daily for 5 days. 10/20/19   Rosemarie Ax, MD  traMADol (ULTRAM) 50 MG tablet Take 1 tablet (50 mg total) by mouth every 6 (six) hours as needed. 08/02/18   Granville Lewis C, PA-C  warfarin (COUMADIN) 2.5 MG tablet Take 2.5 mg by mouth every evening.    [provider]  warfarin (COUMADIN) 5 MG tablet TAKE 1/2 TO 1 TABLET ONCE DAILY AS DIRECTED BY COUMADIN CLINIC. 06/08/19   Lelon Perla, MD    Family History Family History  Problem Relation Age of Onset  . Heart attack Father   . Stroke Sister   .  Neuropathy Brother   . COPD Sister     Social History Social History   Tobacco Use  . Smoking status: Former Smoker    Packs/day: 0.50    Types: Cigarettes    Quit date: 11/30/1991    Years since quitting: 27.9  . Smokeless tobacco: Never Used  . Tobacco comment: Tobacco use-no  Substance Use Topics  . Alcohol use: No  . Drug use: No     Allergies   Codeine and Penicillins   Review of Systems Review of Systems  Constitutional: Positive for fever.  HENT: Negative for congestion.   Respiratory: Negative for cough.   Cardiovascular: Negative for chest pain.  Gastrointestinal: Negative for abdominal pain.  Musculoskeletal: Positive for arthralgias.  Skin: Negative for color change.  Neurological: Negative for weakness.  Hematological: Negative for adenopathy.     Physical Exam Triage Vital Signs ED Triage Vitals [10/20/19 1256]  Enc Vitals Group     BP      Pulse      Resp      Temp      Temp src      SpO2      Weight      Height      Head Circumference      Peak Flow      Pain Score 0     Pain Loc      Pain Edu?      Excl. in GC?    No data found.  Updated Vital Signs BP (!)  155/57 (BP Location: Left Arm)   Pulse 65   Temp 99.1 F (37.3 C) (Oral)   Resp 18   SpO2 97%   Visual Acuity Right Eye Distance:   Left Eye Distance:   Bilateral Distance:    Right Eye Near:   Left Eye Near:    Bilateral Near:     Physical Exam Gen: NAD, alert, cooperative with exam, well-appearing ENT: normal lips, normal nasal mucosa, tympanic membranes clear and intact bilaterally, normal oropharynx, no cervical lymphadenopathy Eye: normal EOM, normal conjunctiva and lids CV:  no edema, +2 pedal pulses, regular rate and rhythm, S1-S2   Resp: no accessory muscle use, non-labored, clear to auscultation bilaterally, no crackles or wheezes  Skin: no rashes, no areas of induration  Neuro: normal tone, normal sensation to touch Psych:  normal insight, alert and oriented MSK: Normal gait, normal strength    UC Treatments / Results  Labs (all labs ordered are listed, but only abnormal results are displayed) Labs Reviewed  POCT URINALYSIS DIP (DEVICE) - Abnormal; Notable for the following components:      Result Value   Hgb urine dipstick TRACE (*)    All other components within normal limits  NOVEL CORONAVIRUS, NAA (HOSP ORDER, SEND-OUT TO REF LAB; TAT 18-24 HRS)    EKG   Radiology Dg Chest 2 View  Result Date: 10/20/2019 CLINICAL DATA:  Fatigue, cough and congestion since yesterday, former smoker, COPD, CHF, hypertension, dilated cardiomyopathy, prior stenting EXAM: CHEST - 2 VIEW COMPARISON:  07/16/2018 FINDINGS: Enlargement of cardiac silhouette post CABG and AVR. Mediastinal contours and pulmonary vascularity normal. Atherosclerotic calcification aorta. Emphysematous and bronchitic changes consistent with COPD. Minimal basilar atelectasis and small bibasilar pleural effusions, greater on RIGHT. No acute infiltrate or pneumothorax. Bones demineralized with chronic appearing height losses of adjacent midthoracic vertebra. IMPRESSION: Enlargement of cardiac silhouette post  CABG and AVR. COPD changes with small bibasilar pleural effusions and minimal atelectasis, greater on RIGHT.  Aortic Atherosclerosis (ICD10-I70.0) and Emphysema (ICD10-J43.9). Electronically Signed   By: Ulyses SouthwardMark  Boles M.D.   On: 10/20/2019 13:47    Procedures Procedures (including critical care time)  Medications Ordered in UC Medications - No data to display  Initial Impression / Assessment and Plan / UC Course  I have reviewed the triage vital signs and the nursing notes.  Pertinent labs & imaging results that were available during my care of the patient were reviewed by me and considered in my medical decision making (see chart for details).     [name] is a [age] female that is presenting with a small dry cough.  Her weight has been increasing over the past couple of days with increased her Lasix.  She also had a temperature yesterday of 100.5.  Her caregiver is getting tested for Covid tonight.  She does have COPD changes on her chest x-ray as well as some small pleural effusions.  She has no cough while being examined today and no edema.  Provided prednisone and doxycycline for possible COPD exacerbation.  Counseled on increasing Lasix if weight continues to be elevated.  Advised to follow-up if symptoms are ongoing and given indications to seek more immediate care.  Final Clinical Impressions(s) / UC Diagnoses   Final diagnoses:  COPD exacerbation [facility](HCC)     Discharge Instructions     Please monitor her symptoms  WE will call with the results from today  Please follow up if your symptoms fail to improve.     ED Prescriptions    Medication Sig Dispense Auth. Provider   predniSONE (DELTASONE) 50 MG tablet One tab PO daily for 5 days. 5 tablet Myra RudeSchmitz, Sheril Hammond E, MD   doxycycline (VIBRA-TABS) 100 MG tablet Take 1 tablet (100 mg total) by mouth 2 (two) times daily for 7 days. 14 tablet Myra RudeSchmitz, Jakeim Sedore E, MD     PDMP not reviewed this encounter.   Myra RudeSchmitz, Cassadee Vanzandt E, MD  10/20/19 [phone]

## 2019-10-20 NOTE — ED Triage Notes (Signed)
Episode yesterday of being very sleepy.  Temp 100.5, and cough.

## 2019-10-20 NOTE — Discharge Instructions (Signed)
Please monitor her symptoms  WE will call with the results from today  Please follow up if your symptoms fail to improve.

## 2019-10-22 ENCOUNTER — Telehealth: Payer: Self-pay | Admitting: Pharmacist

## 2019-10-22 LAB — NOVEL CORONAVIRUS, NAA (HOSP ORDER, SEND-OUT TO REF LAB; TAT 18-24 HRS): SARS-CoV-2, NAA: DETECTED — AB

## 2019-10-22 NOTE — Telephone Encounter (Addendum)
Received call from patient's daughter, [name] ([phone]) that she was seen in the ED on 12/5 and was started on prednisone 50mg  daily x5 days and doxycycline 100mg  BID x 7 days, both prescribed on 12/5. She also had a COVID test done due to fatigue and dry cough - this is still pending.  Reece Packer that we will wait for COVID test to result. If it is negative, will schedule INR check for this week. Of note, patient's caregiver and daughter were also tested for COVID - they have not heard results back either (caretaker tested at [facility] and was advised it would be 3-4 day turnaround). She will await our return call. Pt will eat some greens in the mean time.

## 2019-10-23 ENCOUNTER — Telehealth: Payer: Self-pay | Admitting: Emergency Medicine

## 2019-10-23 ENCOUNTER — Telehealth: Payer: Self-pay | Admitting: Orthopaedic Surgery

## 2019-10-23 MED ORDER — PREGABALIN 75 MG PO CAPS: 75.0000 mg | ORAL_CAPSULE | ORAL | 5 refills | Status: DC

## 2019-10-23 NOTE — Telephone Encounter (Signed)
Your test for COVID-19 was positive, meaning that you were infected with the novel coronavirus and could give the germ to others.  Please continue isolation at home for at least 10 days since the start of your symptoms. If you do not have symptoms, please isolate at home for 10 days from the day you were tested. Once you complete your 10 day quarantine, you may return to normal activities as long as you've not had a fever for over 24 hours(without taking fever reducing medicine) and your symptoms are improving. Please continue good preventive care measures, including:  frequent hand-washing, avoid touching your face, cover coughs/sneezes, stay out of crowds and keep a 6 foot distance from others.  Go to the nearest hospital emergency room if fever/cough/breathlessness are severe or illness seems like a threat to life.  Patient contacted by phone and made aware of    results. Pt verbalized understanding and had all questions answered. Quarantine ends Dec 16th   

## 2019-10-23 NOTE — Telephone Encounter (Signed)
Pt's daughter called and stated that pt's covid test was positive and informed the coumadin clinic pt started prednisone that should finish 12/9, and doxycycline that should finish 12/12. Reviewed pt's case with Pharm D. Instructed pt's daughter that pt should only take 1/2 a tablet of Coumadin today instead of a whole tablet and to eat an extra serving of greens daily while she is on the antibiotic. Informed pt's daughter if she notices the pt having any bleeding then the pt should seek medical attention. Also, made her aware that the prednisone and doxy can increase the INR. Changed pt's Coumadin appointment to 11/07/2019. Informed the daughter if pt is having any covid like symptoms then to please call the coumadin clinic so we can change the appointment day.

## 2019-10-23 NOTE — Telephone Encounter (Signed)
Patient and daughter relays that, as per last visit with Dr Luna Glasgow, request refill for medication:  Lyrica, 75mg ; takes 1 capsule every other day.   States Dr Luan Pulling had been prescribing most recently, and previously prescribed by Dr Luna Glasgow. Pharmacy: [facility], in [place]. Please advise.

## 2019-10-24 ENCOUNTER — Telehealth: Payer: Self-pay | Admitting: Nurse Practitioner

## 2019-10-24 ENCOUNTER — Other Ambulatory Visit: Payer: Self-pay | Admitting: Nurse Practitioner

## 2019-10-24 DIAGNOSIS — I10 Essential (primary) hypertension: Secondary | ICD-10-CM

## 2019-10-24 DIAGNOSIS — U071 COVID-19: Secondary | ICD-10-CM

## 2019-10-24 NOTE — Telephone Encounter (Signed)
Called to Discuss with patient about Covid symptoms and the use of bamlanivimab, a monoclonal antibody infusion for those with mild to moderate Covid symptoms and at a high risk of hospitalization.     Pt is qualified for this infusion at the [facility] infusion center due to co-morbid conditions and/or a member of an at-risk group.    Patient is managed for the following:  Patient Active Problem List   Diagnosis Date Noted  . Hematoma of thigh, right, initial encounter 07/23/2018  . Chronic combined systolic and diastolic CHF (congestive heart failure) ([place]) 07/23/2018  . Supratherapeutic INR 07/23/2018  . Acute blood loss anemia 07/23/2018  . CKD (chronic kidney disease) stage 3, GFR 30-59 ml/min 07/23/2018  . COPD (chronic obstructive pulmonary disease) ([place]) 07/23/2018  . Hematoma of right thigh 07/23/2018  . Pressure injury of skin 07/23/2018  . Protein-calorie malnutrition, severe 07/18/2018  . Hip fracture ([place]) 07/16/2018  . COPD exacerbation ([place]) 07/01/2018  . Right hip pain 07/01/2018  . Chronic respiratory failure with hypoxia, on home O2 therapy ([place]) 07/01/2018  . CHF (congestive heart failure) ([place]) 06/28/2018  . Acute diastolic heart failure ([place]) 06/27/2018  . Anemia 06/27/2018  . Congestive dilated cardiomyopathy ([place]) 09/29/2015  . Encounter for therapeutic drug monitoring 12/12/2013  . Transient confusion 04/16/2013  . Mild malnutrition ([place]) 11/28/2012  . Dehydration 11/27/2012  . Dementia ([place]) 11/27/2012  . Pneumonia 11/24/2012  . Aortic valve disorder 01/13/2011  . Carotid artery stenosis 10/13/2009  . S/P AVR (aortic valve replacement) 10/13/2009  . Hyperlipidemia 03/06/2009  . Essential hypertension 03/06/2009  . CAD, ARTERY BYPASS GRAFT 03/06/2009  . LBBB 03/06/2009  . CVA 03/06/2009    Spoke with patient's daughter. They will think about it and call us back if they decide to get the infusion.

## 2019-10-24 NOTE — Progress Notes (Signed)
  I connected by phone with [name] on 10/24/2019 at 10:22 AM to discuss the potential use of an new treatment for mild to moderate COVID-19 viral infection in non-hospitalized patients.  This patient is a 85 y.o. female that meets the FDA criteria for Emergency Use Authorization of bamlanivimab or casirivimab\imdevimab.  Has a (+) direct SARS-CoV-2 viral test result  Has mild or moderate COVID-19   Is ? 85 years of age and weighs ? 40 kg  Is NOT hospitalized due to COVID-19  Is NOT requiring oxygen therapy or requiring an increase in baseline oxygen flow rate due to COVID-19  Is within 10 days of symptom onset  Has at least one of the high risk factor(s) for progression to severe COVID-19 and/or hospitalization as defined in EUA.  Specific high risk criteria : Hypertension  Patient Active Problem List   Diagnosis Date Noted  . Hematoma of thigh, right, initial encounter 07/23/2018  . Chronic combined systolic and diastolic CHF (congestive heart failure) ([place]) 07/23/2018  . Supratherapeutic INR 07/23/2018  . Acute blood loss anemia 07/23/2018  . CKD (chronic kidney disease) stage 3, GFR 30-59 ml/min 07/23/2018  . COPD (chronic obstructive pulmonary disease) ([place]) 07/23/2018  . Hematoma of right thigh 07/23/2018  . Pressure injury of skin 07/23/2018  . Protein-calorie malnutrition, severe 07/18/2018  . Hip fracture ([place]) 07/16/2018  . COPD exacerbation ([place]) 07/01/2018  . Right hip pain 07/01/2018  . Chronic respiratory failure with hypoxia, on home O2 therapy ([place]) 07/01/2018  . CHF (congestive heart failure) ([place]) 06/28/2018  . Acute diastolic heart failure ([place]) 06/27/2018  . Anemia 06/27/2018  . Congestive dilated cardiomyopathy ([place]) 09/29/2015  . Encounter for therapeutic drug monitoring 12/12/2013  . Transient confusion 04/16/2013  . Mild malnutrition ([place]) 11/28/2012  . Dehydration 11/27/2012  . Dementia ([place]) 11/27/2012  . Pneumonia 11/24/2012  . Aortic  valve disorder 01/13/2011  . Carotid artery stenosis 10/13/2009  . S/P AVR (aortic valve replacement) 10/13/2009  . Hyperlipidemia 03/06/2009  . Essential hypertension 03/06/2009  . CAD, ARTERY BYPASS GRAFT 03/06/2009  . LBBB 03/06/2009  . CVA 03/06/2009    I have spoken and communicated the following to the patient or parent/caregiver:  1. FDA has authorized the emergency use of bamlanivimab for the treatment of mild to moderate COVID-19 in adults and pediatric patients with positive results of direct SARS-CoV-2 viral testing who are 44 years of age and older weighing at least 40 kg, and who are at high risk for progressing to severe COVID-19 and/or hospitalization.  2. The significant known and potential risks and benefits of bamlanivimab, and the extent to which such potential risks and benefits are unknown.  3. Information on available alternative treatments and the risks and benefits of those alternatives, including clinical trials.  4. Patients treated with bamlanivimab should continue to self-isolate and use infection control measures (e.g., wear mask, isolate, social distance, avoid sharing personal items, clean and disinfect "high touch" surfaces, and frequent handwashing) according to CDC guidelines.   5. The patient or parent/caregiver has the option to accept or refuse bamlanivimab.  After reviewing this information with the patient, The patient agreed to proceed with receiving the infusion of bamlanivimab and will be provided a copy of the Fact sheet prior to receiving the infusion.[name] 10/24/2019 10:22 AM

## 2019-10-25 ENCOUNTER — Encounter (HOSPITAL_COMMUNITY): Payer: Self-pay | Admitting: *Deleted

## 2019-10-25 ENCOUNTER — Ambulatory Visit (HOSPITAL_COMMUNITY)
Admission: RE | Admit: 2019-10-25 | Discharge: 2019-10-25 | Disposition: A | Discharge status: Home / Self Care | Payer: Medicare Other | Source: Ambulatory Visit | Attending: Pulmonary Disease | Admitting: Pulmonary Disease

## 2019-10-25 ENCOUNTER — Other Ambulatory Visit: Payer: Self-pay

## 2019-10-25 ENCOUNTER — Inpatient Hospital Stay (HOSPITAL_COMMUNITY)
Admission: EM | Admit: 2019-10-25 | Discharge: 2019-10-30 | DRG: 871 | Disposition: A | Discharge status: Home Health | Payer: Medicare Other | Attending: Internal Medicine | Admitting: Internal Medicine

## 2019-10-25 ENCOUNTER — Emergency Department (HOSPITAL_COMMUNITY): Payer: Medicare Other

## 2019-10-25 DIAGNOSIS — J440 Chronic obstructive pulmonary disease with acute lower respiratory infection: Secondary | ICD-10-CM | POA: Diagnosis present

## 2019-10-25 DIAGNOSIS — I5043 Acute on chronic combined systolic (congestive) and diastolic (congestive) heart failure: Secondary | ICD-10-CM | POA: Diagnosis present

## 2019-10-25 DIAGNOSIS — Z7901 Long term (current) use of anticoagulants: Secondary | ICD-10-CM

## 2019-10-25 DIAGNOSIS — E785 Hyperlipidemia, unspecified: Secondary | ICD-10-CM | POA: Diagnosis present

## 2019-10-25 DIAGNOSIS — Z7951 Long term (current) use of inhaled steroids: Secondary | ICD-10-CM

## 2019-10-25 DIAGNOSIS — I10 Essential (primary) hypertension: Secondary | ICD-10-CM

## 2019-10-25 DIAGNOSIS — D66 Hereditary factor VIII deficiency: Secondary | ICD-10-CM | POA: Diagnosis present

## 2019-10-25 DIAGNOSIS — L89301 Pressure ulcer of unspecified buttock, stage 1: Secondary | ICD-10-CM | POA: Diagnosis present

## 2019-10-25 DIAGNOSIS — R778 Other specified abnormalities of plasma proteins: Secondary | ICD-10-CM

## 2019-10-25 DIAGNOSIS — J9621 Acute and chronic respiratory failure with hypoxia: Secondary | ICD-10-CM | POA: Diagnosis present

## 2019-10-25 DIAGNOSIS — I509 Heart failure, unspecified: Secondary | ICD-10-CM

## 2019-10-25 DIAGNOSIS — Z79899 Other long term (current) drug therapy: Secondary | ICD-10-CM

## 2019-10-25 DIAGNOSIS — Z9981 Dependence on supplemental oxygen: Secondary | ICD-10-CM | POA: Diagnosis not present

## 2019-10-25 DIAGNOSIS — U071 COVID-19: Secondary | ICD-10-CM | POA: Diagnosis not present

## 2019-10-25 DIAGNOSIS — R7989 Other specified abnormal findings of blood chemistry: Secondary | ICD-10-CM

## 2019-10-25 DIAGNOSIS — R0689 Other abnormalities of breathing: Secondary | ICD-10-CM | POA: Diagnosis not present

## 2019-10-25 DIAGNOSIS — F0390 Unspecified dementia without behavioral disturbance: Secondary | ICD-10-CM | POA: Diagnosis present

## 2019-10-25 DIAGNOSIS — J1289 Other viral pneumonia: Secondary | ICD-10-CM | POA: Diagnosis not present

## 2019-10-25 DIAGNOSIS — E7800 Pure hypercholesterolemia, unspecified: Secondary | ICD-10-CM | POA: Diagnosis present

## 2019-10-25 DIAGNOSIS — I2510 Atherosclerotic heart disease of native coronary artery without angina pectoris: Secondary | ICD-10-CM | POA: Diagnosis present

## 2019-10-25 DIAGNOSIS — N1831 Chronic kidney disease, stage 3a: Secondary | ICD-10-CM | POA: Diagnosis present

## 2019-10-25 DIAGNOSIS — R6520 Severe sepsis without septic shock: Secondary | ICD-10-CM | POA: Diagnosis not present

## 2019-10-25 DIAGNOSIS — I110 Hypertensive heart disease with heart failure: Secondary | ICD-10-CM | POA: Diagnosis present

## 2019-10-25 DIAGNOSIS — E559 Vitamin D deficiency, unspecified: Secondary | ICD-10-CM | POA: Diagnosis present

## 2019-10-25 DIAGNOSIS — Z951 Presence of aortocoronary bypass graft: Secondary | ICD-10-CM

## 2019-10-25 DIAGNOSIS — I5033 Acute on chronic diastolic (congestive) heart failure: Secondary | ICD-10-CM | POA: Diagnosis not present

## 2019-10-25 DIAGNOSIS — R4182 Altered mental status, unspecified: Secondary | ICD-10-CM

## 2019-10-25 DIAGNOSIS — N179 Acute kidney failure, unspecified: Secondary | ICD-10-CM | POA: Diagnosis not present

## 2019-10-25 DIAGNOSIS — Z952 Presence of prosthetic heart valve: Secondary | ICD-10-CM

## 2019-10-25 DIAGNOSIS — G609 Hereditary and idiopathic neuropathy, unspecified: Secondary | ICD-10-CM | POA: Diagnosis present

## 2019-10-25 DIAGNOSIS — I447 Left bundle-branch block, unspecified: Secondary | ICD-10-CM | POA: Diagnosis present

## 2019-10-25 DIAGNOSIS — Z825 Family history of asthma and other chronic lower respiratory diseases: Secondary | ICD-10-CM

## 2019-10-25 DIAGNOSIS — J1282 Pneumonia due to coronavirus disease 2019: Secondary | ICD-10-CM | POA: Diagnosis present

## 2019-10-25 DIAGNOSIS — Z66 Do not resuscitate: Secondary | ICD-10-CM | POA: Diagnosis not present

## 2019-10-25 DIAGNOSIS — I248 Other forms of acute ischemic heart disease: Secondary | ICD-10-CM | POA: Diagnosis present

## 2019-10-25 DIAGNOSIS — A419 Sepsis, unspecified organism: Secondary | ICD-10-CM | POA: Diagnosis not present

## 2019-10-25 DIAGNOSIS — H3530 Unspecified macular degeneration: Secondary | ICD-10-CM | POA: Diagnosis present

## 2019-10-25 DIAGNOSIS — Z8673 Personal history of transient ischemic attack (TIA), and cerebral infarction without residual deficits: Secondary | ICD-10-CM

## 2019-10-25 DIAGNOSIS — A4189 Other specified sepsis: Secondary | ICD-10-CM | POA: Diagnosis not present

## 2019-10-25 DIAGNOSIS — Z823 Family history of stroke: Secondary | ICD-10-CM

## 2019-10-25 DIAGNOSIS — Z8249 Family history of ischemic heart disease and other diseases of the circulatory system: Secondary | ICD-10-CM

## 2019-10-25 DIAGNOSIS — J9601 Acute respiratory failure with hypoxia: Secondary | ICD-10-CM | POA: Diagnosis not present

## 2019-10-25 DIAGNOSIS — R404 Transient alteration of awareness: Secondary | ICD-10-CM | POA: Diagnosis not present

## 2019-10-25 DIAGNOSIS — Z87891 Personal history of nicotine dependence: Secondary | ICD-10-CM

## 2019-10-25 DIAGNOSIS — R0902 Hypoxemia: Secondary | ICD-10-CM | POA: Diagnosis not present

## 2019-10-25 LAB — URINALYSIS, ROUTINE W REFLEX MICROSCOPIC
Bilirubin Urine: NEGATIVE
Glucose, UA: NEGATIVE mg/dL
Hgb urine dipstick: NEGATIVE
Ketones, ur: NEGATIVE mg/dL
Leukocytes,Ua: NEGATIVE
Nitrite: NEGATIVE
Protein, ur: 30 mg/dL — AB
Specific Gravity, Urine: 1.009 (ref 1.005–1.030)
pH: 5 (ref 5.0–8.0)

## 2019-10-25 LAB — PROTIME-INR
INR: 3.8 — ABNORMAL HIGH (ref 0.8–1.2)
Prothrombin Time: 37.8 seconds — ABNORMAL HIGH (ref 11.4–15.2)

## 2019-10-25 LAB — TROPONIN I (HIGH SENSITIVITY)
Troponin I (High Sensitivity): 105 ng/L (ref <18)
Troponin I (High Sensitivity): 547 ng/L (ref <18)

## 2019-10-25 LAB — I-STAT CHEM 8, ED
BUN: 30 mg/dL — ABNORMAL HIGH (ref 8–23)
Calcium, Ion: 1.16 mmol/L (ref 1.15–1.40)
Chloride: 105 mmol/L (ref 98–111)
Creatinine, Ser: 1.4 mg/dL — ABNORMAL HIGH (ref 0.44–1.00)
Glucose, Bld: 226 mg/dL — ABNORMAL HIGH (ref 70–99)
HCT: 37 % (ref 36.0–46.0)
Hemoglobin: 12.6 g/dL (ref 12.0–15.0)
Potassium: 3.6 mmol/L (ref 3.5–5.1)
Sodium: 142 mmol/L (ref 135–145)
TCO2: 26 mmol/L (ref 22–32)

## 2019-10-25 LAB — COMPREHENSIVE METABOLIC PANEL
ALT: 34 U/L (ref 0–44)
AST: 41 U/L (ref 15–41)
Albumin: 3.8 g/dL (ref 3.5–5.0)
Alkaline Phosphatase: 73 U/L (ref 38–126)
Anion gap: 11 (ref 5–15)
BUN: 32 mg/dL — ABNORMAL HIGH (ref 8–23)
CO2: 25 mmol/L (ref 22–32)
Calcium: 8.2 mg/dL — ABNORMAL LOW (ref 8.9–10.3)
Chloride: 105 mmol/L (ref 98–111)
Creatinine, Ser: 1.45 mg/dL — ABNORMAL HIGH (ref 0.44–1.00)
GFR calc Af Amer: 37 mL/min — ABNORMAL LOW (ref >60)
GFR calc non Af Amer: 32 mL/min — ABNORMAL LOW (ref >60)
Glucose, Bld: 238 mg/dL — ABNORMAL HIGH (ref 70–99)
Potassium: 3.6 mmol/L (ref 3.5–5.1)
Sodium: 141 mmol/L (ref 135–145)
Total Bilirubin: 0.5 mg/dL (ref 0.3–1.2)
Total Protein: 7.1 g/dL (ref 6.5–8.1)

## 2019-10-25 LAB — CBC WITH DIFFERENTIAL/PLATELET
Abs Immature Granulocytes: 0.07 10*3/uL (ref 0.00–0.07)
Basophils Absolute: 0 10*3/uL (ref 0.0–0.1)
Basophils Relative: 0 %
Eosinophils Absolute: 0 10*3/uL (ref 0.0–0.5)
Eosinophils Relative: 0 %
HCT: 38.7 % (ref 36.0–46.0)
Hemoglobin: 11.5 g/dL — ABNORMAL LOW (ref 12.0–15.0)
Immature Granulocytes: 1 %
Lymphocytes Relative: 31 %
Lymphs Abs: 3.9 10*3/uL (ref 0.7–4.0)
MCH: 31.6 pg (ref 26.0–34.0)
MCHC: 29.7 g/dL — ABNORMAL LOW (ref 30.0–36.0)
MCV: 106.3 fL — ABNORMAL HIGH (ref 80.0–100.0)
Monocytes Absolute: 0.5 10*3/uL (ref 0.1–1.0)
Monocytes Relative: 4 %
Neutro Abs: 7.9 10*3/uL — ABNORMAL HIGH (ref 1.7–7.7)
Neutrophils Relative %: 64 %
Platelets: 205 10*3/uL (ref 150–400)
RBC: 3.64 MIL/uL — ABNORMAL LOW (ref 3.87–5.11)
RDW: 12.7 % (ref 11.5–15.5)
WBC: 12.3 10*3/uL — ABNORMAL HIGH (ref 4.0–10.5)
nRBC: 0 % (ref 0.0–0.2)

## 2019-10-25 LAB — LACTIC ACID, PLASMA
Lactic Acid, Venous: 5.2 mmol/L (ref 0.5–1.9)
Lactic Acid, Venous: 2.4 mmol/L (ref 0.5–1.9)

## 2019-10-25 LAB — BLOOD GAS, ARTERIAL
Acid-base deficit: 1.3 mmol/L (ref 0.0–2.0)
Bicarbonate: 21.4 mmol/L (ref 20.0–28.0)
Drawn by: 27407
FIO2: 100
O2 Saturation: 93 %
Patient temperature: 38.7
pCO2 arterial: 74.4 mmHg (ref 32.0–48.0)
pH, Arterial: 7.174 — CL (ref 7.350–7.450)
pO2, Arterial: 85.3 mmHg (ref 83.0–108.0)

## 2019-10-25 LAB — FERRITIN: Ferritin: 702 ng/mL — ABNORMAL HIGH (ref 11–307)

## 2019-10-25 LAB — BRAIN NATRIURETIC PEPTIDE: B Natriuretic Peptide: 1998 pg/mL — ABNORMAL HIGH (ref 0.0–100.0)

## 2019-10-25 LAB — D-DIMER, QUANTITATIVE: D-Dimer, Quant: 2.47 ug/mL-FEU — ABNORMAL HIGH (ref 0.00–0.50)

## 2019-10-25 LAB — PROCALCITONIN: Procalcitonin: <0.1 ng/mL

## 2019-10-25 LAB — FIBRINOGEN: Fibrinogen: 404 mg/dL (ref 210–475)

## 2019-10-25 LAB — CBG MONITORING, ED: Glucose-Capillary: 219 mg/dL — ABNORMAL HIGH (ref 70–99)

## 2019-10-25 LAB — C-REACTIVE PROTEIN: CRP: 0.6 mg/dL (ref <1.0)

## 2019-10-25 LAB — TRIGLYCERIDES: Triglycerides: 136 mg/dL (ref <150)

## 2019-10-25 LAB — LACTATE DEHYDROGENASE: LDH: 253 U/L — ABNORMAL HIGH (ref 98–192)

## 2019-10-25 MED ORDER — SODIUM CHLORIDE 0.9 % IV SOLN
100.0000 mg | Freq: Every day | INTRAVENOUS | Status: DC
  Filled 2019-10-25: qty 20

## 2019-10-25 MED ORDER — METHYLPREDNISOLONE SODIUM SUCC 125 MG IJ SOLR: 125.0000 mg | Freq: Once | INTRAMUSCULAR | Status: DC | PRN

## 2019-10-25 MED ORDER — ALBUTEROL SULFATE HFA 108 (90 BASE) MCG/ACT IN AERS: 2.0000 | INHALATION_SPRAY | Freq: Once | RESPIRATORY_TRACT | Status: DC | PRN

## 2019-10-25 MED ORDER — FUROSEMIDE 10 MG/ML IJ SOLN
80.0000 mg | Freq: Once | INTRAMUSCULAR | Status: DC
  Filled 2019-10-25: qty 8

## 2019-10-25 MED ORDER — WARFARIN - PHARMACIST DOSING INPATIENT: Freq: Every day | Status: DC

## 2019-10-25 MED ORDER — DIPHENHYDRAMINE HCL 50 MG/ML IJ SOLN: 50.0000 mg | Freq: Once | INTRAMUSCULAR | Status: DC | PRN

## 2019-10-25 MED ORDER — EPINEPHRINE 0.3 MG/0.3ML IJ SOAJ: 0.3000 mg | Freq: Once | INTRAMUSCULAR | Status: DC | PRN

## 2019-10-25 MED ORDER — SODIUM CHLORIDE 0.9 % IV SOLN
200.0000 mg | Freq: Once | INTRAVENOUS | Status: AC
  Administered 2019-10-26: 200 mg via INTRAVENOUS
  Filled 2019-10-25: qty 40

## 2019-10-25 MED ORDER — SODIUM CHLORIDE 0.9 % IV SOLN
700.0000 mg | Freq: Once | INTRAVENOUS | Status: AC
  Administered 2019-10-25: 14:00:00 700 mg via INTRAVENOUS
  Filled 2019-10-25: qty 20

## 2019-10-25 MED ORDER — SODIUM CHLORIDE 0.9 % IV SOLN: INTRAVENOUS | Status: DC

## 2019-10-25 MED ORDER — FAMOTIDINE IN NACL 20-0.9 MG/50ML-% IV SOLN: 20.0000 mg | Freq: Once | INTRAVENOUS | Status: DC | PRN

## 2019-10-25 MED ORDER — SODIUM CHLORIDE 0.9 % IV SOLN: INTRAVENOUS | Status: DC | PRN

## 2019-10-25 MED ORDER — FUROSEMIDE 10 MG/ML IJ SOLN
80.0000 mg | Freq: Once | INTRAMUSCULAR | Status: AC
  Administered 2019-10-25: 80 mg via INTRAVENOUS

## 2019-10-25 MED ORDER — ACETAMINOPHEN 650 MG RE SUPP
975.0000 mg | Freq: Once | RECTAL | Status: AC
  Administered 2019-10-25: 975 mg via RECTAL

## 2019-10-25 NOTE — ED Provider Notes (Signed)
[facility]ERGENCY DEPARTMENT Provider Note   CSN: [id number] Arrival date & time: 10/25/19  1850     History Chief Complaint  Patient presents with  . Shortness of Breath    [name] is a 85 y.o. female.  Patient brought in from [facility] assisted [facility] for altered mental status and severe shortness of breath.  Patient tested positive for COVID-19 infection on December 5.  Was symptomatic a few days prior to that.  Seen on urgent care that day.  The test was positive had a chest x-ray done then without any significant infiltrates.  They had discussed with pulmonary and she was referred for monoclonal antibody treatment at [facility] as an outpatient which she received a dose this morning.  When she got back to [facility] at some point she it was discovered that she was having trouble breathing and that she was not very responsive.  Patient is a DNR.  Paperwork came with her.  Past medical history significant for hypertension coronary artery disease known left bundle branch block.  Also patient has severe COPD is on 3 L of oxygen at all times.  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  With poor ejection fraction.  And she has had aortic valve replacement in the past.  She is a former smoker.  Quit in 1993.  Patient as stated not able to verbalize upon arrival here.  Does open her eyes to her name.  Moves upper extremities spontaneously but not purposefully.  Will not follow commands.  Did attempt to verbalize a little bit.  Patient in severe respiratory distress.        Past Medical History:  Diagnosis Date  . Carotid artery stenosis    Without infarction  . Chronic combined systolic (congestive) and diastolic (congestive) heart failure (HCC)   . COPD (chronic obstructive pulmonary disease) (HCC)   . Coronary artery disease   . Heart valve replaced by other means   . Hypercholesterolemia    Pure  . Hypertension    Unspecified  . LBBB  (left bundle branch block)   . Macular degeneration (senile) of retina, unspecified   . On home O2   . Postsurgical aortocoronary bypass status   . Stroke (HCC)   . Transient global amnesia   . Unspecified hereditary and idiopathic peripheral neuropathy   . Unspecified vitamin D deficiency     Patient Active Problem List   Diagnosis Date Noted  . Pneumonia due to COVID-19 virus 10/25/2019  . Hematoma of thigh, right, initial encounter 07/23/2018  . Chronic combined systolic and diastolic CHF (congestive heart failure) (HCC) 07/23/2018  . Supratherapeutic INR 07/23/2018  . Acute blood loss anemia 07/23/2018  . CKD (chronic kidney disease) stage 3, GFR 30-59 ml/min 07/23/2018  . COPD (chronic obstructive pulmonary disease) (HCC) 07/23/2018  . Hematoma of right thigh 07/23/2018  . Pressure injury of skin 07/23/2018  . Protein-calorie malnutrition, severe 07/18/2018  . Hip fracture (HCC) 07/16/2018  . COPD exacerbation (HCC) 07/01/2018  . Right hip pain 07/01/2018  . Chronic respiratory failure with hypoxia, on home O2 therapy (HCC) 07/01/2018  . CHF (congestive heart failure) (HCC) 06/28/2018  . Acute diastolic heart failure (HCC) 06/27/2018  . Anemia 06/27/2018  . Congestive dilated cardiomyopathy (HCC) 09/29/2015  . Encounter for therapeutic drug monitoring 12/12/2013  . Transient confusion 04/16/2013  . Mild malnutrition (HCC) 11/28/2012  . Dehydration 11/27/2012  . Dementia (HCC) 11/27/2012  . Pneumonia 11/24/2012  . Aortic  valve disorder 01/13/2011  . Carotid artery stenosis 10/13/2009  . S/P AVR (aortic valve replacement) 10/13/2009  . Hyperlipidemia 03/06/2009  . Essential hypertension 03/06/2009  . CAD, ARTERY BYPASS GRAFT 03/06/2009  . LBBB 03/06/2009  . CVA 03/06/2009    Past Surgical History:  Procedure Laterality Date  . AORTIC VALVE REPLACEMENT    . APPENDECTOMY    . CORONARY ARTERY BYPASS GRAFT    . HIP PINNING,CANNULATED Right 07/18/2018   Procedure:  RIGHT CANNULATED HIP PINNING;  Surgeon: Myrene Galas, MD;  Location: [facility] OR;  Service: Orthopedics;  Laterality: Right;  . TONSILLECTOMY       OB History   No obstetric history on file.     Family History  Problem Relation Age of Onset  . Heart attack Father   . Stroke Sister   . Neuropathy Brother   . COPD Sister     Social History   Tobacco Use  . Smoking status: Former Smoker    Packs/day: 0.50    Types: Cigarettes    Quit date: 11/30/1991    Years since quitting: 27.9  . Smokeless tobacco: Never Used  . Tobacco comment: Tobacco use-no  Substance Use Topics  . Alcohol use: No  . Drug use: No    Home Medications Prior to Admission medications   Medication Sig Start Date End Date Taking? Authorizing Provider  acetaminophen (TYLENOL) 325 MG tablet Take 650 mg by mouth every 4 (four) hours as needed.   Yes [provider]  ALPRAZolam (XANAX) 0.25 MG tablet Take 0.25 mg by mouth at bedtime as needed for anxiety.   Yes [provider]  atorvastatin (LIPITOR) 40 MG tablet Take 1 tablet (40 mg total) by mouth daily. 12/14/18  Yes Lewayne Bunting, MD  baclofen (LIORESAL) 10 MG tablet Take 10 mg by mouth as needed for muscle spasms.   Yes [provider]  carvedilol (COREG) 6.25 MG tablet Take 1 tablet (6.25 mg total) by mouth 2 (two) times daily with a meal. Keep scheduled appointment 07/09/19  Yes Lewayne Bunting, MD  docusate sodium (COLACE) 100 MG capsule Take 100 mg by mouth at bedtime.    Yes [provider]  donepezil (ARICEPT) 10 MG tablet TAKE ONE TABLET BY MOUTH ONCE DAILY. 08/12/15  Yes Penumalli, Glenford Bayley, MD  doxycycline (VIBRA-TABS) 100 MG tablet Take 1 tablet (100 mg total) by mouth 2 (two) times daily for 7 days. 10/20/19 10/27/19 Yes Myra Rude, MD  ergocalciferol (VITAMIN D2) 50000 UNITS capsule Take 50,000 Units by mouth every Tuesday.    Yes [provider]  ferrous sulfate 325 (65 FE) MG EC tablet Take 325  mg by mouth 2 (two) times daily.   Yes [provider]  furosemide (LASIX) 20 MG tablet TAKE ONE TABLET BY MOUTH ONCE DAILY.TAKE EXTRA IF NEEDED. 09/24/19  Yes Crenshaw, Madolyn Frieze, MD  levalbuterol Pauline Aus) 0.63 MG/3ML nebulizer solution Take 0.63 mg by nebulization every evening.   Yes [provider]  meclizine (ANTIVERT) 25 MG tablet Take 25 mg by mouth 3 (three) times daily as needed for dizziness.   Yes [provider]  Melatonin 3 MG TABS Take 1 tablet by mouth at bedtime.   Yes [provider]  multivitamin-lutein (OCUVITE-LUTEIN) CAPS capsule Take 1 capsule by mouth daily.   Yes [provider]  predniSONE (DELTASONE) 50 MG tablet One tab PO daily for 5 days. 10/20/19  Yes Myra Rude, MD  pregabalin (LYRICA) 75 MG capsule  Take 1 capsule (75 mg total) by mouth every other day. 10/23/19  Yes Darreld Mclean, MD  spironolactone (ALDACTONE) 25 MG tablet TAKE (1/2) TABLET BY MOUTH DAILY. 08/09/19  Yes Lewayne Bunting, MD  umeclidinium-vilanterol (ANORO ELLIPTA) 62.5-25 MCG/INH AEPB Inhale 1 puff into the lungs daily.    Yes [provider]  warfarin (COUMADIN) 2.5 MG tablet Take 2.5 mg by mouth every evening.   Yes [provider]  warfarin (COUMADIN) 5 MG tablet TAKE 1/2 TO 1 TABLET ONCE DAILY AS DIRECTED BY COUMADIN CLINIC. 06/08/19  Yes Crenshaw, Madolyn Frieze, MD  hydrocortisone (ANUSOL-HC) 2.5 % rectal cream Place 1 application rectally 3 (three) times daily as needed for hemorrhoids or anal itching.    [provider]  methylPREDNISolone (MEDROL DOSEPAK) 4 MG TBPK tablet  01/16/19   [provider]    Allergies    Codeine and Penicillins  Review of Systems   Review of Systems  Unable to perform ROS: Patient unresponsive    Physical Exam Updated Vital Signs BP (!) 84/33   Pulse 73   Temp 99.5 F (37.5 C)   Resp (!) 24   SpO2 100%   Physical Exam Vitals and nursing note reviewed.  Constitutional:       General: She is in acute distress.     Appearance: She is well-developed. She is diaphoretic.  HENT:     Head: Normocephalic and atraumatic.  Eyes:     Extraocular Movements: Extraocular movements intact.     Conjunctiva/sclera: Conjunctivae normal.     Pupils: Pupils are equal, round, and reactive to light.  Cardiovascular:     Rate and Rhythm: Normal rate and regular rhythm.     Heart sounds: No murmur.  Pulmonary:     Effort: Pulmonary effort is normal. No respiratory distress.     Breath sounds: Normal breath sounds.  Abdominal:     Palpations: Abdomen is soft.     Tenderness: There is no abdominal tenderness.  Musculoskeletal:        General: No swelling.     Cervical back: Normal range of motion and neck supple.  Skin:    General: Skin is warm.     Capillary Refill: Capillary refill takes less than 2 seconds.  Neurological:     Mental Status: She is alert.     Comments: Patient attempts to verbalize a little bit but for the most part nonverbal not able to follow commands but does seem to open her eyes to calling her name.  Will move both upper extremities spontaneously but not purposefully.  Had not seen her move her lower extremities.     ED Results / Procedures / Treatments   Labs (all labs ordered are listed, but only abnormal results are displayed) Labs Reviewed  CBC WITH DIFFERENTIAL/PLATELET - Abnormal; Notable for the following components:      Result Value   WBC 12.3 (*)    RBC 3.64 (*)    Hemoglobin 11.5 (*)    MCV 106.3 (*)    MCHC 29.7 (*)    Neutro Abs 7.9 (*)    All other components within normal limits  COMPREHENSIVE METABOLIC PANEL - Abnormal; Notable for the following components:   Glucose, Bld 238 (*)    BUN 32 (*)    Creatinine, Ser 1.45 (*)    Calcium 8.2 (*)    GFR calc non Af Amer 32 (*)    GFR calc Af Amer 37 (*)    All  other components within normal limits  PROTIME-INR - Abnormal; Notable for the following components:   Prothrombin Time  37.8 (*)    INR 3.8 (*)    All other components within normal limits  BLOOD GAS, ARTERIAL - Abnormal; Notable for the following components:   pH, Arterial 7.174 (*)    pCO2 arterial 74.4 (*)    All other components within normal limits  BRAIN NATRIURETIC PEPTIDE - Abnormal; Notable for the following components:   B Natriuretic Peptide 1,998.0 (*)    All other components within normal limits  LACTIC ACID, PLASMA - Abnormal; Notable for the following components:   Lactic Acid, Venous 5.2 (*)    All other components within normal limits  LACTIC ACID, PLASMA - Abnormal; Notable for the following components:   Lactic Acid, Venous 2.4 (*)    All other components within normal limits  D-DIMER, QUANTITATIVE (NOT AT [facility]) - Abnormal; Notable for the following components:   D-Dimer, Quant 2.47 (*)    All other components within normal limits  LACTATE DEHYDROGENASE - Abnormal; Notable for the following components:   LDH 253 (*)    All other components within normal limits  FERRITIN - Abnormal; Notable for the following components:   Ferritin 702 (*)    All other components within normal limits  URINALYSIS, ROUTINE W REFLEX MICROSCOPIC - Abnormal; Notable for the following components:   APPearance CLOUDY (*)    Protein, ur 30 (*)    Bacteria, UA RARE (*)    All other components within normal limits  I-STAT CHEM 8, ED - Abnormal; Notable for the following components:   BUN 30 (*)    Creatinine, Ser 1.40 (*)    Glucose, Bld 226 (*)    All other components within normal limits  CBG MONITORING, ED - Abnormal; Notable for the following components:   Glucose-Capillary 219 (*)    All other components within normal limits  TROPONIN I (HIGH SENSITIVITY) - Abnormal; Notable for the following components:   Troponin I (High Sensitivity) 105 (*)    All other components within normal limits  TROPONIN I (HIGH SENSITIVITY) - Abnormal; Notable for the following components:   Troponin I (High  Sensitivity) 547 (*)    All other components within normal limits  CULTURE, BLOOD (ROUTINE X 2)  CULTURE, BLOOD (ROUTINE X 2)  URINE CULTURE  PROCALCITONIN  TRIGLYCERIDES  FIBRINOGEN  C-REACTIVE PROTEIN  PROTIME-INR    EKG EKG Interpretation  Date/Time:  Thursday October 25 2019 18:58:03 EST Ventricular Rate:  111 PR Interval:    QRS Duration: 174 QT Interval:  336 QTC Calculation: 457 R Axis:   107 Text Interpretation: Sinus tachycardia Probable left atrial enlargement Consider left ventricular hypertrophy Repol abnrm, global ischemia, diffuse leads Left bundle branch block Confirmed by Fredia Sorrow [phone]) on 10/25/2019 7:05:42 PM   Radiology CT Head Wo Contrast  Result Date: 10/25/2019 CLINICAL DATA:  Altered mental status. Coronavirus infection. EXAM: CT HEAD WITHOUT CONTRAST TECHNIQUE: Contiguous axial images were obtained from the base of the skull through the vertex without intravenous contrast. COMPARISON:  12/27/2008 FINDINGS: Brain: Age related atrophy. Chronic small-vessel ischemic changes of the white matter. No sign of acute infarction, mass lesion, hemorrhage, hydrocephalus or extra-axial collection. Vascular: There is atherosclerotic calcification of the major vessels at the base of the brain. Skull: Negative Sinuses/Orbits: Clear/normal Other: None IMPRESSION: No acute finding by CT. Age related atrophy and chronic small-vessel ischemic changes of the white matter. Electronically Signed   By:  Paulina Fusi M.D.   On: 10/25/2019 20:16   CT Chest Wo Contrast  Result Date: 10/25/2019 CLINICAL DATA:  Altered mental status. Coronavirus infection. EXAM: CT CHEST WITHOUT CONTRAST TECHNIQUE: Multidetector CT imaging of the chest was performed following the standard protocol without IV contrast. COMPARISON:  Chest radiography same day. FINDINGS: Cardiovascular: Mild cardiomegaly. Previous median sternotomy. Aortic atherosclerosis. Coronary artery calcification.  Mediastinum/Nodes: No hilar mass or adenopathy suspected on this noncontrast study. Lungs/Pleura: Background emphysema. Widespread bilateral patchy pulmonary infiltrates, more extensive in the right lung than the left, affecting all lobes, consistent with the clinical diagnosis of viral pneumonia. No pleural effusion. Upper Abdomen: Normal Musculoskeletal: Chronic degenerative changes of the spine. IMPRESSION: 1. Widespread bilateral patchy pulmonary infiltrates, more extensive in the right lung than the left, consistent with the clinical diagnosis of viral pneumonia. 2. Emphysema and aortic atherosclerosis. Aortic Atherosclerosis (ICD10-I70.0) and Emphysema (ICD10-J43.9). Electronically Signed   By: Paulina Fusi M.D.   On: 10/25/2019 20:20   DG Chest Port 1 View  Result Date: 10/25/2019 CLINICAL DATA:  Shortness of breath and COVID-19 positivity EXAM: PORTABLE CHEST 1 VIEW COMPARISON:  10/20/2011 FINDINGS: Cardiac shadow is enlarged in size and stable. Aortic calcifications are noted. New diffuse bilateral infiltrates are seen right greater than left significantly increased when compared with the prior exam. These changes are consistent with the given clinical history of COVID-19 positivity. Postsurgical changes are noted. No acute bony abnormality is seen. IMPRESSION: New diffuse bilateral infiltrative changes right greater than left consistent with the given history. Electronically Signed   By: Alcide Clever M.D.   On: 10/25/2019 19:53    Procedures Procedures (including critical care time)  CRITICAL CARE Performed by: Vanetta Mulders Total critical care time: 45 minutes Critical care time was exclusive of separately billable procedures and treating other patients. Critical care was necessary to treat or prevent imminent or life-threatening deterioration. Critical care was time spent personally by me on the following activities: development of treatment plan with patient and/or surrogate as well as  nursing, discussions with consultants, evaluation of patient's response to treatment, examination of patient, obtaining history from patient or surrogate, ordering and performing treatments and interventions, ordering and review of laboratory studies, ordering and review of radiographic studies, pulse oximetry and re-evaluation of patient's condition.   Medications Ordered in ED Medications  Warfarin - Pharmacist Dosing Inpatient (has no administration in time range)  remdesivir 200 mg in sodium chloride 0.9% 250 mL IVPB (has no administration in time range)    Followed by  remdesivir 100 mg in sodium chloride 0.9 % 100 mL IVPB (has no administration in time range)  acetaminophen (TYLENOL) suppository 975 mg (975 mg Rectal Given 10/25/19 2040)  furosemide (LASIX) injection 80 mg (80 mg Intravenous Given 10/25/19 2248)    ED Course  I have reviewed the triage vital signs and the nursing notes.  Pertinent labs & imaging results that were available during my care of the patient were reviewed by me and considered in my medical decision making (see chart for details).    MDM Rules/Calculators/A&P     CHA2DS2/VAS Stroke Risk Points      N/A >= 2 Points: High Risk  1 - 1.99 Points: Medium Risk  0 Points: Low Risk    A final score could not be computed because of missing components.: Last  Change: N/A     This score determines the patient's risk of having a stroke if the  patient has atrial fibrillation.  This score is not applicable to this patient. Components are not  calculated.                  Patient in severe respiratory distress.  Normally would require intubation but it was clear that she is a DNR.  Discussed with her granddaughter and her daughter who is power of attorney and they stated that their mother had clearly made the decision to be a DNR on her own.  Most of it was based on her severe COPD with normally on 3 L of oxygen all the time and also has severe atherosclerotic  disease.  She did have a short stent in a nursing facility and she did not want to be on life support.  Patient CT head without any acute findings chest x-ray was difficult to interpret because patient was slumped over.  But CT chest suggestive of a multifocal pneumonia.  But CHF not completely ruled out.  Is patient's labs came in.  Patient was febrile here she received Tylenol we are thinking that this was predominantly Covid related so she was not started on sepsis protocol or broad-spectrum antibiotics.  Then her troponin came back markedly elevated and her BNP was markedly elevated.  Patient's blood gas showed significant PCO2 retention.  Patient was given Lasix 80 mg IV.  Discussed with respiratory therapy even though she was a Covid positive patient we are starting to think that most of her respiratory problem may be was non-Covid related predominantly and wanted to put her on BiPAP but they stated that that was not allowed in the past we have been occasionally been able to put people on BiPAP on the ventilator closed circuit and positive pressure room which we could have done.  But respiratory therapy refused.  So we gave the patient Lasix she started to come around doing better discussed with Dr. Sharl Ma the hospitalist and patient will be admitted to [facility].  Long conversation with the family does state that would be very touching ago that she had arrived very sick and we were concerned that she would not survive this.  But after the Lasix patient started to come around she is now talking she was able to talk to her family on the phone.  Final Clinical Impression(s) / ED Diagnoses Final diagnoses:  COVID-19 virus infection  Altered mental status, unspecified altered mental status type  DNR (do not resuscitate)  Acute on chronic congestive heart failure, unspecified heart failure type (HCC)  Elevated troponin    Rx / DC Orders ED Discharge Orders    None       Vanetta Mulders,  MD 10/26/19 0013

## 2019-10-25 NOTE — Progress Notes (Signed)
  Diagnosis: COVID-19  Physician: Dr. Wright  Procedure: Covid Infusion Clinic Med: bamlanivimab infusion - Provided patient with bamlanimivab fact sheet for patients, parents and caregivers prior to infusion.  Complications: No immediate complications noted.  Discharge: Discharged home   [name] 10/25/2019  

## 2019-10-25 NOTE — ED Notes (Signed)
Date and time results received: 10/25/19 2100  Test: pCO2 / pH Critical Value: 74 / 7.174  Name of Provider Notified: Rogene Houston, MD

## 2019-10-25 NOTE — H&P (Signed)
TRH H&P    Patient Demographics:    [name], is a 85 y.o. female  MRN: [id number]  DOB - [birth year]  Admit Date - 10/25/2019  Referring MD/NP/PA: Deretha EmoryZackowski  Outpatient Primary MD for the patient is Kari BaarsHawkins, Edward, MD  Patient coming from: Home  Chief complaint-shortness of breath   HPI:    [name]  is a 85 y.o. female, with history of CAD s/p CABG, aortic valve replacement, COPD on 3 L/min of oxygen at home, chronic diastolic heart failure, hypertension, dementia was diagnosed with COVID-19 infection on December 5.  [facility] to get monoclonal antibody infusion Bamlanivimab, which has been approved for mild to moderate Covid symptoms and at high risk of hospitalization.  Patient got infusion around 2:30 PM, when she returned to nursing home patient was found to have decreased level of consciousness and also worsening shortness of breath. Patient was put on nonrebreather 15 L/min with O2 sats 97%.  Chest x-ray showed new diffuse bilateral infiltrate changes right more than left consistent with COVID-19 positivity. Patient is a DNR. Lab work also revealed BNP of 2000, 80 mg of IV Lasix given in the ED. Patient is alert, communication is limited due to underlying dementia, and also patient on nonrebreather. Though she denies any pain.     Review of systems:    In addition to the HPI above,    All other systems reviewed and are negative.    Past History of the following :    Past Medical History:  Diagnosis Date  . Carotid artery stenosis    Without infarction  . Chronic combined systolic (congestive) and diastolic (congestive) heart failure (HCC)   . COPD (chronic obstructive pulmonary disease) (HCC)   . Coronary artery disease   . Heart valve replaced by other means   . Hypercholesterolemia    Pure  . Hypertension    Unspecified  . LBBB (left bundle  branch block)   . Macular degeneration (senile) of retina, unspecified   . On home O2   . Postsurgical aortocoronary bypass status   . Stroke (HCC)   . Transient global amnesia   . Unspecified hereditary and idiopathic peripheral neuropathy   . Unspecified vitamin D deficiency       Past Surgical History:  Procedure Laterality Date  . AORTIC VALVE REPLACEMENT    . APPENDECTOMY    . CORONARY ARTERY BYPASS GRAFT    . HIP PINNING,CANNULATED Right 07/18/2018   Procedure: RIGHT CANNULATED HIP PINNING;  Surgeon: Myrene GalasHandy, Michael, MD;  Location: [facility] OR;  Service: Orthopedics;  Laterality: Right;  . TONSILLECTOMY        Social History:      Social History   Tobacco Use  . Smoking status: Former Smoker    Packs/day: 0.50    Types: Cigarettes    Quit date: 11/30/1991    Years since quitting: 27.9  . Smokeless tobacco: Never Used  . Tobacco comment: Tobacco use-no  Substance Use Topics  . Alcohol use: No  Family History :     Family History  Problem Relation Age of Onset  . Heart attack Father   . Stroke Sister   . Neuropathy Brother   . COPD Sister       Home Medications:   Prior to Admission medications   Medication Sig Start Date End Date Taking? Authorizing Provider  doxycycline (VIBRA-TABS) 100 MG tablet Take 1 tablet (100 mg total) by mouth 2 (two) times daily for 7 days. 10/20/19 10/27/19 Yes Myra Rude, MD  predniSONE (DELTASONE) 50 MG tablet One tab PO daily for 5 days. 10/20/19  Yes Myra Rude, MD  pregabalin (LYRICA) 75 MG capsule Take 1 capsule (75 mg total) by mouth every other day. 10/23/19  Yes Darreld Mclean, MD  warfarin (COUMADIN) 2.5 MG tablet Take 2.5 mg by mouth every evening.   Yes [provider]  warfarin (COUMADIN) 5 MG tablet TAKE 1/2 TO 1 TABLET ONCE DAILY AS DIRECTED BY COUMADIN CLINIC. 06/08/19  Yes Lewayne Bunting, MD  acetaminophen (TYLENOL) 325 MG tablet Take 650 mg by mouth every 4 (four) hours as needed.     [provider]  ALPRAZolam Prudy Feeler) 0.25 MG tablet Take 0.25 mg by mouth at bedtime as needed for anxiety.    [provider]  atorvastatin (LIPITOR) 40 MG tablet Take 1 tablet (40 mg total) by mouth daily. 12/14/18   Lewayne Bunting, MD  baclofen (LIORESAL) 10 MG tablet Take 10 mg by mouth as needed for muscle spasms.    [provider]  carvedilol (COREG) 6.25 MG tablet Take 1 tablet (6.25 mg total) by mouth 2 (two) times daily with a meal. Keep scheduled appointment 07/09/19   Lewayne Bunting, MD  docusate sodium (COLACE) 100 MG capsule Take 100 mg by mouth at bedtime.     [provider]  donepezil (ARICEPT) 10 MG tablet TAKE ONE TABLET BY MOUTH ONCE DAILY. 08/12/15   Penumalli, Glenford Bayley, MD  ergocalciferol (VITAMIN D2) 50000 UNITS capsule Take 50,000 Units by mouth every Tuesday.     [provider]  ferrous sulfate 325 (65 FE) MG EC tablet Take 325 mg by mouth 2 (two) times daily.    [provider]  furosemide (LASIX) 20 MG tablet TAKE ONE TABLET BY MOUTH ONCE DAILY.TAKE EXTRA IF NEEDED. 09/24/19   Lewayne Bunting, MD  HYDROcodone-acetaminophen (NORCO/VICODIN) 5-325 MG tablet Take 1-2 tablets by mouth every 6 (six) hours as needed for moderate pain. 08/11/18   Roena Malady, PA-C  hydrocortisone (ANUSOL-HC) 2.5 % rectal cream Place 1 application rectally 3 (three) times daily as needed for hemorrhoids or anal itching.    [provider]  levalbuterol Pauline Aus) 0.63 MG/3ML nebulizer solution Take 0.63 mg by nebulization every evening.    [provider]  meclizine (ANTIVERT) 25 MG tablet Take 25 mg by mouth 3 (three) times daily as needed for dizziness.    [provider]  Melatonin 3 MG TABS Take 1 tablet by mouth at bedtime.    [provider]  methylPREDNISolone (MEDROL DOSEPAK) 4 MG TBPK tablet  01/16/19   [provider]  multivitamin-lutein (OCUVITE-LUTEIN) CAPS capsule Take 1 capsule by  mouth daily.    [provider]  ondansetron (ZOFRAN) 4 MG tablet Take 4 mg by mouth every 8 (eight) hours as needed for nausea or vomiting.    [provider]  polyethylene glycol (MIRALAX / GLYCOLAX) packet Take 17 g by mouth daily as needed.  [provider]  spironolactone (ALDACTONE) 25 MG tablet TAKE (1/2) TABLET BY MOUTH DAILY. 08/09/19   Lelon Perla, MD  traMADol (ULTRAM) 50 MG tablet Take 1 tablet (50 mg total) by mouth every 6 (six) hours as needed. 08/02/18   Granville Lewis C, PA-C  umeclidinium-vilanterol (ANORO ELLIPTA) 62.5-25 MCG/INH AEPB Inhale 1 puff into the lungs daily.     [provider]     Allergies:     Allergies  Allergen Reactions  . Codeine Nausea And Vomiting  . Penicillins Other (See Comments)    Unknown reaction - listed on MAR Has patient had a PCN reaction causing immediate rash, facial/tongue/throat swelling, SOB or lightheadedness with hypotension: Unknown Has patient had a PCN reaction causing severe rash involving mucus membranes or skin necrosis: Unknown Has patient had a PCN reaction that required hospitalization: Unknown Has patient had a PCN reaction occurring within the last 10 years: Unknown If all of the above answers are "NO", then may proceed with Cephalosporin use.     Physical Exam:   Vitals  Blood pressure (!) 162/60, pulse (!) 108, temperature (!) 102.4 F (39.1 C), resp. rate (!) 29, SpO2 96 %.  1.  General: Appears in moderate respiratory distress  2. Psychiatric: Not tested  3. Neurologic: Alert, moving all extremities, following commands  4. HEENMT:  Atraumatic normocephalic, extraocular muscles are intact  5. Respiratory : Bilateral crackles auscultated  6. Cardiovascular : S1-S2, regular, no murmur auscultated  7. Gastrointestinal:  Abdomen is soft, nontender, no organomegaly      Data Review:    CBC Recent Labs  Lab 10/25/19 1907 10/25/19 1919  WBC 12.3*  --     HGB 11.5* 12.6  HCT 38.7 37.0  PLT 205  --   MCV 106.3*  --   MCH 31.6  --   MCHC 29.7*  --   RDW 12.7  --   LYMPHSABS 3.9  --   MONOABS 0.5  --   EOSABS 0.0  --   BASOSABS 0.0  --    ------------------------------------------------------------------------------------------------------------------  Results for orders placed or performed during the hospital encounter of 10/25/19 (from the past 48 hour(s))  CBC with Differential     Status: Abnormal   Collection Time: 10/25/19  7:07 PM  Result Value Ref Range   WBC 12.3 (H) 4.0 - 10.5 K/uL   RBC 3.64 (L) 3.87 - 5.11 MIL/uL   Hemoglobin 11.5 (L) 12.0 - 15.0 g/dL   HCT 38.7 36.0 - 46.0 %   MCV 106.3 (H) 80.0 - 100.0 fL   MCH 31.6 26.0 - 34.0 pg   MCHC 29.7 (L) 30.0 - 36.0 g/dL   RDW 12.7 11.5 - 15.5 %   Platelets 205 150 - 400 K/uL   nRBC 0.0 0.0 - 0.2 %   Neutrophils Relative % 64 %   Neutro Abs 7.9 (H) 1.7 - 7.7 K/uL   Lymphocytes Relative 31 %   Lymphs Abs 3.9 0.7 - 4.0 K/uL   Monocytes Relative 4 %   Monocytes Absolute 0.5 0.1 - 1.0 K/uL   Eosinophils Relative 0 %   Eosinophils Absolute 0.0 0.0 - 0.5 K/uL   Basophils Relative 0 %   Basophils Absolute 0.0 0.0 - 0.1 K/uL   Immature Granulocytes 1 %   Abs Immature Granulocytes 0.07 0.00 - 0.07 K/uL    Comment: Performed at [facility], [address] [place], [place] [address]  Comprehensive metabolic panel     Status: Abnormal  Collection Time: 10/25/19  7:07 PM  Result Value Ref Range   Sodium 141 135 - 145 mmol/L   Potassium 3.6 3.5 - 5.1 mmol/L   Chloride 105 98 - 111 mmol/L   CO2 25 22 - 32 mmol/L   Glucose, Bld 238 (H) 70 - 99 mg/dL   BUN 32 (H) 8 - 23 mg/dL   Creatinine, Ser 2.42 (H) 0.44 - 1.00 mg/dL   Calcium 8.2 (L) 8.9 - 10.3 mg/dL   Total Protein 7.1 6.5 - 8.1 g/dL   Albumin 3.8 3.5 - 5.0 g/dL   AST 41 15 - 41 U/L   ALT 34 0 - 44 U/L   Alkaline Phosphatase 73 38 - 126 U/L   Total Bilirubin 0.5 0.3 - 1.2 mg/dL   GFR calc non Af Amer 32 (L) >60  mL/min   GFR calc Af Amer 37 (L) >60 mL/min   Anion gap 11 5 - 15    Comment: Performed at [facility], [address], [place], [place] [address]  Protime-INR     Status: Abnormal   Collection Time: 10/25/19  7:07 PM  Result Value Ref Range   Prothrombin Time 37.8 (H) 11.4 - 15.2 seconds   INR 3.8 (H) 0.8 - 1.2    Comment: (NOTE) INR goal varies based on device and disease states. Performed at [facility], [address] [place], [place] [address]   Brain natriuretic peptide     Status: Abnormal   Collection Time: 10/25/19  7:07 PM  Result Value Ref Range   B Natriuretic Peptide 1,998.0 (H) 0.0 - 100.0 pg/mL    Comment: Performed at [facility], [address] [place], [place] [address]  Troponin I (High Sensitivity)     Status: Abnormal   Collection Time: 10/25/19  7:07 PM  Result Value Ref Range   Troponin I (High Sensitivity) 105 (HH) <18 ng/L    Comment: CRITICAL RESULT CALLED TO, READ BACK BY AND VERIFIED WITH: SAPPELT,J AT 2124 ON 12.10.2020 BY ISLEY,B (NOTE) Elevated high sensitivity troponin I (hsTnI) values and significant  changes across serial measurements may suggest ACS but many other  chronic and acute conditions are known to elevate hsTnI results.  Refer to the Links section for chest pain algorithms and additional  guidance. Performed at [facility], [address], [place], [place] [address]   Lactic acid, plasma     Status: Abnormal   Collection Time: 10/25/19  7:07 PM  Result Value Ref Range   Lactic Acid, Venous 5.2 (HH) 0.5 - 1.9 mmol/L    Comment: CRITICAL RESULT CALLED TO, READ BACK BY AND VERIFIED WITH: DOSS,M AT 2110 ON 12.10.20 BY ISLEY,B Performed at [facility], [address] [place], [place] [address]   D-dimer, quantitative     Status: Abnormal   Collection Time: 10/25/19  7:07 PM  Result Value Ref Range   D-Dimer, Quant 2.47 (H) 0.00 - 0.50 ug/mL-FEU    Comment: (NOTE) At the manufacturer cut-off of 0.50 ug/mL FEU, this assay has  been documented to exclude PE with a sensitivity and negative predictive value of 97 to 99%.  At this time, this assay has not been approved by the FDA to exclude DVT/VTE. Results should be correlated with clinical presentation. Performed at [facility], [address] [place], [place] [address]   Procalcitonin     Status: None   Collection Time: 10/25/19  7:07 PM  Result Value Ref Range   Procalcitonin <0.10 ng/mL  Comment: Performed at [facility], [address], [place], [place] [address]  Lactate dehydrogenase     Status: Abnormal   Collection Time: 10/25/19  7:07 PM  Result Value Ref Range   LDH 253 (H) 98 - 192 U/L    Comment: Performed at [facility], [address], [place], [place] [address]  Ferritin     Status: Abnormal   Collection Time: 10/25/19  7:07 PM  Result Value Ref Range   Ferritin 702 (H) 11 - 307 ng/mL    Comment: Performed at [facility], [address] [place], [place] [address]  Triglycerides     Status: None   Collection Time: 10/25/19  7:07 PM  Result Value Ref Range   Triglycerides 136 <150 mg/dL    Comment: Performed at [facility], [address], [place], [place] [address]  Fibrinogen     Status: None   Collection Time: 10/25/19  7:07 PM  Result Value Ref Range   Fibrinogen 404 210 - 475 mg/dL    Comment: Performed at [facility], [address], [place], [place] [address]  C-reactive protein     Status: None   Collection Time: 10/25/19  7:07 PM  Result Value Ref Range   CRP 0.6 <1.0 mg/dL    Comment: Performed at [facility], [address] [place], [place] [address]  CBG monitoring, ED     Status: Abnormal   Collection Time: 10/25/19  7:13 PM  Result Value Ref Range   Glucose-Capillary 219 (H) 70 - 99 mg/dL  I-stat chem 8, ED (not at [facility])     Status: Abnormal   Collection Time: 10/25/19  7:19 PM  Result Value Ref Range   Sodium 142 135 - 145 mmol/L   Potassium 3.6 3.5 - 5.1 mmol/L   Chloride 105 98 - 111 mmol/L   BUN 30 (H)  8 - 23 mg/dL   Creatinine, Ser 9.62 (H) 0.44 - 1.00 mg/dL   Glucose, Bld 952 (H) 70 - 99 mg/dL   Calcium, Ion 8.41 3.24 - 1.40 mmol/L   TCO2 26 22 - 32 mmol/L   Hemoglobin 12.6 12.0 - 15.0 g/dL   HCT 40.1 02.7 - 25.3 %  Blood gas, arterial (at [facility])     Status: Abnormal   Collection Time: 10/25/19  8:07 PM  Result Value Ref Range   FIO2 100.00    pH, Arterial 7.174 (LL) 7.350 - 7.450    Comment: CRITICAL RESULT CALLED TO, READ BACK BY AND VERIFIED WITH: J APPELT,RN @2059  10/25/19 MKELLY    pCO2 arterial 74.4 (HH) 32.0 - 48.0 mmHg    Comment: CRITICAL RESULT CALLED TO, READ BACK BY AND VERIFIED WITH: J APPELT,RN @2059  10/25/19 MKELLY    pO2, Arterial 85.3 83.0 - 108.0 mmHg   Bicarbonate 21.4 20.0 - 28.0 mmol/L   Acid-base deficit 1.3 0.0 - 2.0 mmol/L   O2 Saturation 93.0 %   Patient temperature 38.7    Collection site RIGHT RADIAL    Drawn by [id number]    Allens test (pass/fail) PASS PASS    Comment: Performed at [facility], [address] [place], [place] [address]  Urinalysis, Routine w reflex microscopic     Status: Abnormal   Collection Time: 10/25/19  8:52 PM  Result Value Ref Range   Color, Urine YELLOW YELLOW   APPearance CLOUDY (A) CLEAR   Specific Gravity, Urine 1.009 1.005 - 1.030   pH 5.0 5.0 - 8.0   Glucose,  UA NEGATIVE NEGATIVE mg/dL   Hgb urine dipstick NEGATIVE NEGATIVE   Bilirubin Urine NEGATIVE NEGATIVE   Ketones, ur NEGATIVE NEGATIVE mg/dL   Protein, ur 30 (A) NEGATIVE mg/dL   Nitrite NEGATIVE NEGATIVE   Leukocytes,Ua NEGATIVE NEGATIVE   RBC / HPF 0-5 0 - 5 RBC/hpf   WBC, UA 0-5 0 - 5 WBC/hpf   Bacteria, UA RARE (A) NONE SEEN   Squamous Epithelial / LPF 0-5 0 - 5   Mucus PRESENT    Hyaline Casts, UA PRESENT    Amorphous Crystal PRESENT     Comment: Performed at [facility], [address], [place], [place] [address]  Blood Culture (routine x 2)     Status: None (Preliminary result)   Collection Time: 10/25/19  9:05 PM   Specimen: BLOOD LEFT HAND   Result Value Ref Range   Specimen Description BLOOD LEFT HAND    Special Requests      BOTTLES DRAWN AEROBIC AND ANAEROBIC Blood Culture adequate volume Performed at [facility] [address], [place], [place] [address]    Culture PENDING    Report Status PENDING   Blood Culture (routine x 2)     Status: None (Preliminary result)   Collection Time: 10/25/19  9:16 PM   Specimen: BLOOD RIGHT ARM  Result Value Ref Range   Specimen Description BLOOD RIGHT ARM    Special Requests      BOTTLES DRAWN AEROBIC AND ANAEROBIC Blood Culture adequate volume Performed at [facility], [address], [place], [place] [address]    Culture PENDING    Report Status PENDING     Chemistries  Recent Labs  Lab 10/25/19 1907 10/25/19 1919  NA 141 142  K 3.6 3.6  CL 105 105  CO2 25  --   GLUCOSE 238* 226*  BUN 32* 30*  CREATININE 1.45* 1.40*  CALCIUM 8.2*  --   AST 41  --   ALT 34  --   ALKPHOS 73  --   BILITOT 0.5  --    ------------------------------------------------------------------------------------------------------------------  ------------------------------------------------------------------------------------------------------------------ GFR: CrCl cannot be calculated (Unknown ideal weight.). Liver Function Tests: Recent Labs  Lab 10/25/19 1907  AST 41  ALT 34  ALKPHOS 73  BILITOT 0.5  PROT 7.1  ALBUMIN 3.8   No results for input(s): LIPASE, AMYLASE in the last 168 hours. No results for input(s): AMMONIA in the last 168 hours. Coagulation Profile: Recent Labs  Lab 10/25/19 1907  INR 3.8*   Cardiac Enzymes: No results for input(s): CKTOTAL, CKMB, CKMBINDEX, TROPONINI in the last 168 hours. BNP (last 3 results) No results for input(s): PROBNP in the last 8760 hours. HbA1C: No results for input(s): HGBA1C in the last 72 hours. CBG: Recent Labs  Lab 10/25/19 1913  GLUCAP 219*   Lipid Profile: Recent Labs    10/25/19 1907  TRIG 136   Thyroid Function  Tests: No results for input(s): TSH, T4TOTAL, FREET4, T3FREE, THYROIDAB in the last 72 hours. Anemia Panel: Recent Labs    10/25/19 1907  FERRITIN 702*    --------------------------------------------------------------------------------------------------------------- Urine analysis:    Component Value Date/Time   COLORURINE YELLOW 10/25/2019 2052   APPEARANCEUR CLOUDY (A) 10/25/2019 2052   LABSPEC 1.009 10/25/2019 2052   PHURINE 5.0 10/25/2019 2052   GLUCOSEU NEGATIVE 10/25/2019 2052   GLUCOSEU NEGATIVE 04/10/2008 1246   HGBUR NEGATIVE 10/25/2019 2052   BILIRUBINUR NEGATIVE 10/25/2019 2052   KETONESUR NEGATIVE 10/25/2019 2052   PROTEINUR 30 (A) 10/25/2019 2052   UROBILINOGEN 0.2  10/20/2019 1404   NITRITE NEGATIVE 10/25/2019 2052   LEUKOCYTESUR NEGATIVE 10/25/2019 2052      Imaging Results:    CT Head Wo Contrast  Result Date: 10/25/2019 CLINICAL DATA:  Altered mental status. Coronavirus infection. EXAM: CT HEAD WITHOUT CONTRAST TECHNIQUE: Contiguous axial images were obtained from the base of the skull through the vertex without intravenous contrast. COMPARISON:  12/27/2008 FINDINGS: Brain: Age related atrophy. Chronic small-vessel ischemic changes of the white matter. No sign of acute infarction, mass lesion, hemorrhage, hydrocephalus or extra-axial collection. Vascular: There is atherosclerotic calcification of the major vessels at the base of the brain. Skull: Negative Sinuses/Orbits: Clear/normal Other: None IMPRESSION: No acute finding by CT. Age related atrophy and chronic small-vessel ischemic changes of the white matter. Electronically Signed   By: Paulina Fusi M.D.   On: 10/25/2019 20:16   CT Chest Wo Contrast  Result Date: 10/25/2019 CLINICAL DATA:  Altered mental status. Coronavirus infection. EXAM: CT CHEST WITHOUT CONTRAST TECHNIQUE: Multidetector CT imaging of the chest was performed following the standard protocol without IV contrast. COMPARISON:  Chest  radiography same day. FINDINGS: Cardiovascular: Mild cardiomegaly. Previous median sternotomy. Aortic atherosclerosis. Coronary artery calcification. Mediastinum/Nodes: No hilar mass or adenopathy suspected on this noncontrast study. Lungs/Pleura: Background emphysema. Widespread bilateral patchy pulmonary infiltrates, more extensive in the right lung than the left, affecting all lobes, consistent with the clinical diagnosis of viral pneumonia. No pleural effusion. Upper Abdomen: Normal Musculoskeletal: Chronic degenerative changes of the spine. IMPRESSION: 1. Widespread bilateral patchy pulmonary infiltrates, more extensive in the right lung than the left, consistent with the clinical diagnosis of viral pneumonia. 2. Emphysema and aortic atherosclerosis. Aortic Atherosclerosis (ICD10-I70.0) and Emphysema (ICD10-J43.9). Electronically Signed   By: Paulina Fusi M.D.   On: 10/25/2019 20:20   DG Chest Port 1 View  Result Date: 10/25/2019 CLINICAL DATA:  Shortness of breath and COVID-19 positivity EXAM: PORTABLE CHEST 1 VIEW COMPARISON:  10/20/2011 FINDINGS: Cardiac shadow is enlarged in size and stable. Aortic calcifications are noted. New diffuse bilateral infiltrates are seen right greater than left significantly increased when compared with the prior exam. These changes are consistent with the given clinical history of COVID-19 positivity. Postsurgical changes are noted. No acute bony abnormality is seen. IMPRESSION: New diffuse bilateral infiltrative changes right greater than left consistent with the given history. Electronically Signed   By: Alcide Clever M.D.   On: 10/25/2019 19:53    My personal review of EKG: Rhythm NSR, left bundle branch block   Assessment & Plan:    Active Problems:   Pneumonia due to COVID-19 virus   1. Acute hypoxic respiratory failure-likely multifactorial from underlying CHF exacerbation, COVID-19 pneumonia.  Patient is currently on 15 L/min oxygen via nonrebreather mask,  O2 sats 97%.  80 mg IV Lasix given in the ED.  Start Lasix 40 mg IV every 12 hours from tomorrow morning.  Start remdesivir per pharmacy consultation, Decadron 6 mg IV every 12 hours.  LDH 253, Ferritin 702, CRP 0.6, procalcitonin less than 0.10.  2. Acute on chronic systolic and diastolic CHF-BNP is 1998.  Started on Lasix as above.  Troponin I 05, likely from demand ischemia.  Cycle troponin x2.  3. S/p aortic valve replacement -patient has [employer] mechanical aortic valve, currently on Coumadin.  Will consult pharmacy to manage dosing.  4. Dementia-stable, continue Aricept.  5. CAD s/p CABG-stable, continue Coreg, warfarin, Lipitor.  6. COPD-patient is on 3 L/min of oxygen at home all the time.  Started  on Decadron 6 mg IV every 12 hours, continue albuterol inhaler as needed.    DVT Prophylaxis-   Coumadin  AM Labs Ordered, also please review Full Orders    Code Status: DNR  Admission status: Inpatient: Based on patients clinical presentation and evaluation of above clinical data, I have made determination that patient meets Inpatient criteria at this time.  Time spent in minutes : 60 minutes   Aundraya Dripps S Polk Minor M.D

## 2019-10-25 NOTE — ED Notes (Signed)
Pt on 15L NRB

## 2019-10-25 NOTE — ED Notes (Signed)
Date and time results received: 10/25/19 2250  Test: Lactic Acid  Critical Value: 2.4  Name of Provider Notified: Zackowski  Orders Received? Or Actions Taken?: na

## 2019-10-25 NOTE — Progress Notes (Signed)
ANTICOAGULATION CONSULT NOTE - Initial Consult  Pharmacy Consult for Coumadin  Indication: s/p St Jude AVR  Allergies  Allergen Reactions  . Codeine Nausea And Vomiting  . Penicillins Other (See Comments)    Unknown reaction - listed on MAR Has patient had a PCN reaction causing immediate rash, facial/tongue/throat swelling, SOB or lightheadedness with hypotension: Unknown Has patient had a PCN reaction causing severe rash involving mucus membranes or skin necrosis: Unknown Has patient had a PCN reaction that required hospitalization: Unknown Has patient had a PCN reaction occurring within the last 10 years: Unknown If all of the above answers are "NO", then may proceed with Cephalosporin use.    Patient Measurements:    Vital Signs: Temp: 102.4 F (39.1 C) (12/10 2115) Temp Source: Rectal (12/10 2008) BP: 162/60 (12/10 2030) Pulse Rate: 108 (12/10 2115)  Labs: Recent Labs    10/25/19 1907 10/25/19 1919  HGB 11.5* 12.6  HCT 38.7 37.0  PLT 205  --   LABPROT 37.8*  --   INR 3.8*  --   CREATININE 1.45* 1.40*  TROPONINIHS 105*  --     CrCl cannot be calculated (Unknown ideal weight.).   Medical History: Past Medical History:  Diagnosis Date  . Carotid artery stenosis    Without infarction  . Chronic combined systolic (congestive) and diastolic (congestive) heart failure ([place])   . COPD (chronic obstructive pulmonary disease) ([place])   . Coronary artery disease   . Heart valve replaced by other means   . Hypercholesterolemia    Pure  . Hypertension    Unspecified  . LBBB (left bundle branch block)   . Macular degeneration (senile) of retina, unspecified   . On home O2   . Postsurgical aortocoronary bypass status   . Stroke ([place])   . Transient global amnesia   . Unspecified hereditary and idiopathic peripheral neuropathy   . Unspecified vitamin D deficiency     Medications:  See electronic med rec  Assessment: 85 y.o. F presents with COVID-19. Pt on  coumadin PTA for St Jude AVR. Admission INR supratherapeutic (3.8).  Home dose: 2.5mg  daily - has been holding with doxycycline on board   Goal of Therapy:  INR 2-3 Monitor platelets by anticoagulation protocol: Yes   Plan:  Daily PT/INR No coumadin tonight  Sherlon Handing, PharmD, BCPS Please see amion for complete clinical pharmacist phone list 10/25/2019,10:26 PM

## 2019-10-25 NOTE — ED Triage Notes (Signed)
Arrival from [facility] with increasing shortness of breath. Pt is positive for COVID and was [facility] today for a treatment and has decreased level of consciousness since she returned to nursing home. Also increased shortness of breath

## 2019-10-25 NOTE — ED Notes (Signed)
Date and time results received: 10/25/19 2235   Test: Troponin Critical Value: 547  Name of Provider Notified: Rogene Houston, MD

## 2019-10-25 NOTE — ED Notes (Signed)
Date and time results received: 10/25/19 2123   Test: Troponin Critical Value: 105  Name of Provider Notified: Rogene Houston, MD

## 2019-10-26 ENCOUNTER — Inpatient Hospital Stay
Admission: AD | Admit: 2019-10-26 | Payer: Medicare Other | Source: Other Acute Inpatient Hospital | Admitting: Internal Medicine

## 2019-10-26 ENCOUNTER — Other Ambulatory Visit: Payer: Self-pay | Admitting: *Deleted

## 2019-10-26 DIAGNOSIS — J9601 Acute respiratory failure with hypoxia: Secondary | ICD-10-CM

## 2019-10-26 DIAGNOSIS — I5033 Acute on chronic diastolic (congestive) heart failure: Secondary | ICD-10-CM

## 2019-10-26 DIAGNOSIS — U071 COVID-19: Secondary | ICD-10-CM

## 2019-10-26 DIAGNOSIS — J9621 Acute and chronic respiratory failure with hypoxia: Secondary | ICD-10-CM

## 2019-10-26 DIAGNOSIS — J1289 Other viral pneumonia: Secondary | ICD-10-CM

## 2019-10-26 DIAGNOSIS — Z66 Do not resuscitate: Secondary | ICD-10-CM

## 2019-10-26 DIAGNOSIS — R778 Other specified abnormalities of plasma proteins: Secondary | ICD-10-CM

## 2019-10-26 DIAGNOSIS — A419 Sepsis, unspecified organism: Secondary | ICD-10-CM

## 2019-10-26 DIAGNOSIS — R6520 Severe sepsis without septic shock: Secondary | ICD-10-CM

## 2019-10-26 LAB — TROPONIN I (HIGH SENSITIVITY)
Troponin I (High Sensitivity): 1861 ng/L (ref <18)
Troponin I (High Sensitivity): 1670 ng/L (ref <18)

## 2019-10-26 LAB — ABO/RH: ABO/RH(D): O NEG

## 2019-10-26 LAB — PROTIME-INR
INR: 4.5 (ref 0.8–1.2)
Prothrombin Time: 42.6 seconds — ABNORMAL HIGH (ref 11.4–15.2)

## 2019-10-26 LAB — FERRITIN: Ferritin: >7500 ng/mL — ABNORMAL HIGH (ref 11–307)

## 2019-10-26 LAB — D-DIMER, QUANTITATIVE: D-Dimer, Quant: 2.86 ug/mL-FEU — ABNORMAL HIGH (ref 0.00–0.50)

## 2019-10-26 LAB — C-REACTIVE PROTEIN: CRP: 4.4 mg/dL — ABNORMAL HIGH (ref <1.0)

## 2019-10-26 LAB — CBG MONITORING, ED: Glucose-Capillary: 71 mg/dL (ref 70–99)

## 2019-10-26 MED ORDER — SODIUM CHLORIDE 0.9 % IV SOLN
100.0000 mg | Freq: Every day | INTRAVENOUS | Status: AC
  Administered 2019-10-27 – 2019-10-30 (×4): 100 mg via INTRAVENOUS
  Filled 2019-10-26 (×7): qty 20

## 2019-10-26 MED ORDER — CARVEDILOL 3.125 MG PO TABS
3.1250 mg | ORAL_TABLET | Freq: Two times a day (BID) | ORAL | Status: DC
  Administered 2019-10-26 – 2019-10-30 (×8): 3.125 mg via ORAL
  Filled 2019-10-26 (×14): qty 1

## 2019-10-26 MED ORDER — SODIUM CHLORIDE 0.9% FLUSH
3.0000 mL | Freq: Two times a day (BID) | INTRAVENOUS | Status: DC
  Administered 2019-10-26 – 2019-10-30 (×9): 3 mL via INTRAVENOUS

## 2019-10-26 MED ORDER — DONEPEZIL HCL 10 MG PO TABS
10.0000 mg | ORAL_TABLET | Freq: Every day | ORAL | Status: DC
  Administered 2019-10-26 – 2019-10-29 (×4): 10 mg via ORAL
  Filled 2019-10-26 (×7): qty 1

## 2019-10-26 MED ORDER — ONDANSETRON HCL 4 MG/2ML IJ SOLN
4.0000 mg | Freq: Four times a day (QID) | INTRAMUSCULAR | Status: DC | PRN
  Filled 2019-10-26: qty 2

## 2019-10-26 MED ORDER — DEXAMETHASONE SODIUM PHOSPHATE 10 MG/ML IJ SOLN
6.0000 mg | Freq: Two times a day (BID) | INTRAMUSCULAR | Status: DC
  Administered 2019-10-26 – 2019-10-30 (×9): 6 mg via INTRAVENOUS
  Filled 2019-10-26 (×9): qty 1

## 2019-10-26 MED ORDER — FUROSEMIDE 10 MG/ML IJ SOLN: 40.0000 mg | Freq: Two times a day (BID) | INTRAMUSCULAR | Status: DC

## 2019-10-26 MED ORDER — CARVEDILOL 12.5 MG PO TABS
6.2500 mg | ORAL_TABLET | Freq: Two times a day (BID) | ORAL | Status: DC
  Filled 2019-10-26: qty 1

## 2019-10-26 MED ORDER — IPRATROPIUM-ALBUTEROL 20-100 MCG/ACT IN AERS
1.0000 | INHALATION_SPRAY | Freq: Four times a day (QID) | RESPIRATORY_TRACT | Status: DC | PRN
  Filled 2019-10-26: qty 4

## 2019-10-26 MED ORDER — SODIUM CHLORIDE 0.9 % IV SOLN: 250.0000 mL | INTRAVENOUS | Status: DC | PRN

## 2019-10-26 MED ORDER — SODIUM CHLORIDE 0.9 % IV SOLN: 200.0000 mg | Freq: Once | INTRAVENOUS | Status: DC

## 2019-10-26 MED ORDER — CHLORHEXIDINE GLUCONATE CLOTH 2 % EX PADS
6.0000 | MEDICATED_PAD | Freq: Every day | CUTANEOUS | Status: DC
  Administered 2019-10-26 – 2019-10-30 (×3): 6 via TOPICAL

## 2019-10-26 MED ORDER — ATORVASTATIN CALCIUM 40 MG PO TABS
40.0000 mg | ORAL_TABLET | Freq: Every day | ORAL | Status: DC
  Administered 2019-10-26 – 2019-10-30 (×5): 40 mg via ORAL
  Filled 2019-10-26 (×5): qty 1

## 2019-10-26 MED ORDER — SODIUM CHLORIDE 0.9 % IV SOLN: 100.0000 mg | Freq: Every day | INTRAVENOUS | Status: DC

## 2019-10-26 MED ORDER — UMECLIDINIUM-VILANTEROL 62.5-25 MCG/INH IN AEPB
1.0000 | INHALATION_SPRAY | Freq: Every day | RESPIRATORY_TRACT | Status: DC
  Administered 2019-10-26 – 2019-10-30 (×5): 1 via RESPIRATORY_TRACT
  Filled 2019-10-26: qty 14

## 2019-10-26 MED ORDER — TRAMADOL HCL 50 MG PO TABS: 50.0000 mg | ORAL_TABLET | Freq: Four times a day (QID) | ORAL | Status: DC | PRN

## 2019-10-26 MED ORDER — PREGABALIN 50 MG PO CAPS
75.0000 mg | ORAL_CAPSULE | ORAL | Status: DC
  Administered 2019-10-26 – 2019-10-30 (×3): 75 mg via ORAL
  Filled 2019-10-26 (×4): qty 1

## 2019-10-26 MED ORDER — SODIUM CHLORIDE 0.9 % IV BOLUS
250.0000 mL | Freq: Once | INTRAVENOUS | Status: AC
  Administered 2019-10-26: 250 mL via INTRAVENOUS

## 2019-10-26 MED ORDER — ACETAMINOPHEN 325 MG PO TABS
650.0000 mg | ORAL_TABLET | Freq: Four times a day (QID) | ORAL | Status: DC | PRN
  Administered 2019-10-26: 650 mg via ORAL
  Filled 2019-10-26: qty 2

## 2019-10-26 MED ORDER — ZINC SULFATE 220 (50 ZN) MG PO CAPS
220.0000 mg | ORAL_CAPSULE | Freq: Every day | ORAL | Status: DC
  Administered 2019-10-26 – 2019-10-30 (×5): 220 mg via ORAL
  Filled 2019-10-26 (×4): qty 1

## 2019-10-26 MED ORDER — ONDANSETRON HCL 4 MG PO TABS
4.0000 mg | ORAL_TABLET | Freq: Four times a day (QID) | ORAL | Status: DC | PRN
  Administered 2019-10-27 – 2019-10-29 (×2): 4 mg via ORAL
  Filled 2019-10-26 (×2): qty 1

## 2019-10-26 MED ORDER — IPRATROPIUM-ALBUTEROL 20-100 MCG/ACT IN AERS
1.0000 | INHALATION_SPRAY | Freq: Four times a day (QID) | RESPIRATORY_TRACT | Status: DC
  Administered 2019-10-26: 1 via RESPIRATORY_TRACT
  Filled 2019-10-26: qty 4

## 2019-10-26 MED ORDER — FUROSEMIDE 10 MG/ML IJ SOLN
20.0000 mg | Freq: Two times a day (BID) | INTRAMUSCULAR | Status: DC
  Administered 2019-10-26 – 2019-10-28 (×4): 20 mg via INTRAVENOUS
  Filled 2019-10-26 (×5): qty 2

## 2019-10-26 MED ORDER — VITAMIN C 500 MG PO TABS
500.0000 mg | ORAL_TABLET | Freq: Every day | ORAL | Status: DC
  Administered 2019-10-26 – 2019-10-30 (×5): 500 mg via ORAL
  Filled 2019-10-26 (×4): qty 1

## 2019-10-26 MED ORDER — ORAL CARE MOUTH RINSE
15.0000 mL | Freq: Two times a day (BID) | OROMUCOSAL | Status: DC
  Administered 2019-10-26 – 2019-10-30 (×9): 15 mL via OROMUCOSAL

## 2019-10-26 MED ORDER — HYDROCORTISONE 2.5 % RE CREA
1.0000 "application " | TOPICAL_CREAM | Freq: Three times a day (TID) | RECTAL | Status: DC | PRN
  Filled 2019-10-26: qty 28.35

## 2019-10-26 MED ORDER — SODIUM CHLORIDE 0.9% FLUSH: 3.0000 mL | INTRAVENOUS | Status: DC | PRN

## 2019-10-26 NOTE — Patient Outreach (Signed)
[facility]) Care Management  10/26/2019  CHRYSA RAMPY 1925/12/19 [id number]   [place] Hospitalization  Referral Date:  03/08/2019 Referral Source:  Insurance Plan Screening Reason for Referral:  Disease Management Education Insurance:  Medicare   Outreach Attempt: Received notification patient admitted to [facility], awaiting transfer to [facility] for COVID 19 status.  RN Health [facility] Liaison of patient's admission.  Plan:  RN Health Coach will await Hospital Liaisons recommendations for discharge follow up.   [place] [phone] [email or web address]

## 2019-10-26 NOTE — Progress Notes (Signed)
PROGRESS NOTE    [name]  AJO:[id number] DOB: [birth year] DOA: 10/25/2019 PCP: Sinda Du, MD     Brief Narrative:  As per H&P written by Dr. Darrick Meigs on 10/25/2019 85 y.o. female, with history of CAD s/p CABG, aortic valve replacement, COPD on 3 L/min of oxygen at home, chronic diastolic heart failure, hypertension, dementia was diagnosed with COVID-19 infection on December 5.  Today she went to [facility] to get monoclonal antibody infusion Bamlanivimab, which has been approved for mild to moderate Covid symptoms and at high risk of hospitalization.  Patient got infusion around 2:30 PM, when she returned to nursing home patient was found to have decreased level of consciousness and also worsening shortness of breath. Patient was put on nonrebreather 15 L/min with O2 sats 97%.  Chest x-ray showed new diffuse bilateral infiltrate changes right more than left consistent with COVID-19 positivity. Patient is a DNR. Lab work also revealed BNP of 2000, 80 mg of IV Lasix given in the ED. Patient is alert, communication is limited due to underlying dementia, and also patient on nonrebreather. Though she denies any pain.  Assessment & Plan: 1-acute on chronic hypoxemic respiratory failure: In the setting of pneumonia due to COVID-19 virus and acute on chronic diastolic heart failure. -Continue IV steroids, continue remdesivir, continue oxygen supplementation and wean down as tolerated. -Will continue as needed bronchodilators and supportive care. -Low-sodium diet, daily weights and strict intake and output -Continue adjusted dose of Lasix. -Follow clinical response. -Patient reported receiving monoclonal infusion at [name] 2 days prior to worsening symptoms.  2-sepsis with organ dysfunction: due to COVID-19 infection -Patient met sepsis criteria on presentation with high fever, elevated respiratory rate, elevated heart rate and chest x-ray demonstrating subtle infection  with worsening respiratory status and hypoxia for organ dysfunction. -Continue treatment as mentioned above -Continue supportive care.  3-elevated troponin/demand ischemia/prior history of coronary artery disease status post CABG -Most recent troponin up to 1800; patient denies chest pain. -EKG and case discussed with cardiology service nonspecific abnormalities appreciated and recommendations for conservative management only. -Continue beta-blocker, continue statins, no need for aspirin while being chronically on Coumadin. -Patient is not a candidate for catheterization at this time no use of heparin drip recommended. -Continue monitoring on telemetry.  4-hyperlipidemia -Continue statins  5-chronic COPD -Patient receiving steroids for COVID-19 infection; no wheezing appreciated on examination. -She chronically uses 3 L of oxygen. -Will continue the use of Respimat inhaler and also continue Incruse Ellipta daily.  6-status post aortic valve replacement: Patient has a St. Jude's mechanical aortic valve -INR goal 2.5-3.5, Currently 4.5 -Pharmacy dosing Coumadin; anticoagulation will be held today.  7-mild dementia -Stable -Continue Aricept -Continue daily orientation. -No behavioral disturbances appreciated.  8-DNR/DNI -Goals of care discussion, intubation and resuscitation discussed with patient; patient expressed no interest of chest compressions, artificial feeding or mechanical intubation to sustain her last. -Wishes will be respected -Continue treating was treatable.  -Given patient comorbidities and presentation her prognosis and outcome is guarded.   DVT prophylaxis: Chronically on Coumadin; dosed per pharmacy. Code Status: DNR/DNI Family Communication: No family at bedside. Disposition Plan: Patient will be transfer to [place] for further evaluation and management of her acute on chronic hypoxemic respiratory failure and sepsis in the setting of Covid 19  pneumonia and acute on chronic diastolic heart failure.  Consultants:   Cardiology service curbside (Dr. Bronson Ing): Recommendation given for conservative management in the setting of demand ischemia.  Not a candidate  for catheterization and no recommendations for heparin drip.  Continue beta-blocker and statins.  Procedures:   See below for x-ray reports  Antimicrobials:  Anti-infectives (From admission, onward)   Start     Dose/Rate Route Frequency Ordered Stop   10/27/19 1000  remdesivir 100 mg in sodium chloride 0.9 % 100 mL IVPB  Status:  Discontinued     100 mg 200 mL/hr over 30 Minutes Intravenous Daily 10/26/19 0601 10/26/19 0627   10/27/19 1000  remdesivir 100 mg in sodium chloride 0.9 % 100 mL IVPB  Status:  Discontinued     100 mg 200 mL/hr over 30 Minutes Intravenous Daily 10/26/19 0214 10/26/19 0629   10/27/19 1000  remdesivir 100 mg in sodium chloride 0.9 % 100 mL IVPB     100 mg 200 mL/hr over 30 Minutes Intravenous Daily 10/26/19 0628 10/31/19 0959   10/26/19 1000  remdesivir 100 mg in sodium chloride 0.9 % 100 mL IVPB  Status:  Discontinued     100 mg 200 mL/hr over 30 Minutes Intravenous Daily 10/25/19 2234 10/26/19 0214   10/26/19 0615  remdesivir 200 mg in sodium chloride 0.9% 250 mL IVPB  Status:  Discontinued     200 mg 580 mL/hr over 30 Minutes Intravenous Once 10/26/19 0601 10/26/19 0627   10/25/19 2330  remdesivir 200 mg in sodium chloride 0.9% 250 mL IVPB     200 mg 580 mL/hr over 30 Minutes Intravenous Once 10/25/19 2234 10/26/19 0602       Subjective: Currently denying chest pain, no nausea, no vomiting.  Reports breathing is a slightly better.  Still having difficulty speaking in full sentences and experiencing shortness of breath with minimal exertion.  Patient is afebrile at this time.  Objective: Vitals:   10/26/19 0700 10/26/19 0730 10/26/19 0745 10/26/19 0800  BP: (!) 117/38 (!) 142/61  (!) 117/41  Pulse: (!) 58 72 61 60  Resp: 15 18 12 19    Temp: 99.7 F (37.6 C) 99.9 F (37.7 C) 99.9 F (37.7 C) 100 F (37.8 C)  TempSrc:      SpO2: 98% 95% 96% 95%    Intake/Output Summary (Last 24 hours) at 10/26/2019 6160 Last data filed at 10/26/2019 0602 Gross per 24 hour  Intake 290.02 ml  Output --  Net 290.02 ml   There were no vitals filed for this visit.  Examination: General exam: Alert, awake, oriented x 3; currently afebrile; still having difficulty speaking in full sentences and requiring high flow nasal cannula supplementation (on 5-6 L).  No chest pain, no nausea, no vomiting. Respiratory system: Fine crackles at the bases, no using accessory muscles; positive rhonchi.  No wheezing Cardiovascular system: Rate controlled, no rubs, no gallops, soft systolic ejection murmur appreciated, Along with mechanical click. Gastrointestinal system: Abdomen is nondistended, soft and nontender. No organomegaly or masses felt. Normal bowel sounds heard. Central nervous system: Alert and oriented. No focal neurological deficits. Extremities: No cyanosis or clubbing; trace edema appreciated bilaterally. Skin: No petechiae, no erythema.  Stage I ischial tuberosity pressure injury appreciated on present on admission. Psychiatry: Mood & affect appropriate.    Data Reviewed: I have personally reviewed following labs and imaging studies  CBC: Recent Labs  Lab 10/25/19 1907 10/25/19 1919  WBC 12.3*  --   NEUTROABS 7.9*  --   HGB 11.5* 12.6  HCT 38.7 37.0  MCV 106.3*  --   PLT 205  --    Basic Metabolic Panel: Recent Labs  Lab 10/25/19 1907  10/25/19 1919  NA 141 142  K 3.6 3.6  CL 105 105  CO2 25  --   GLUCOSE 238* 226*  BUN 32* 30*  CREATININE 1.45* 1.40*  CALCIUM 8.2*  --    GFR: CrCl cannot be calculated (Unknown ideal weight.).   Liver Function Tests: Recent Labs  Lab 10/25/19 1907  AST 41  ALT 34  ALKPHOS 73  BILITOT 0.5  PROT 7.1  ALBUMIN 3.8   Coagulation Profile: Recent Labs  Lab 10/25/19 1907  10/26/19 0525  INR 3.8* 4.5*   CBG: Recent Labs  Lab 10/25/19 1913  GLUCAP 219*   Lipid Profile: Recent Labs    10/25/19 1907  TRIG 136   Anemia Panel: Recent Labs    10/25/19 1907 10/26/19 0525  FERRITIN 702* >7,500*   Urine analysis:    Component Value Date/Time   COLORURINE YELLOW 10/25/2019 2052   APPEARANCEUR CLOUDY (A) 10/25/2019 2052   LABSPEC 1.009 10/25/2019 2052   PHURINE 5.0 10/25/2019 2052   GLUCOSEU NEGATIVE 10/25/2019 2052   GLUCOSEU NEGATIVE 04/10/2008 1246   [place] 10/25/2019 2052   [place] NEGATIVE 10/25/2019 2052   [place] NEGATIVE 10/25/2019 2052   PROTEINUR 30 (A) 10/25/2019 2052   UROBILINOGEN 0.2 10/20/2019 1404   NITRITE NEGATIVE 10/25/2019 2052   LEUKOCYTESUR NEGATIVE 10/25/2019 2052    Recent Results (from the past 240 hour(s))  Novel Coronavirus, NAA (Hosp order, Send-out to Ref Lab; TAT 18-24 hrs     Status: Abnormal   Collection Time: 10/20/19  1:31 PM   Specimen: Nasopharyngeal Swab; Respiratory  Result Value Ref Range Status   SARS-CoV-2, NAA DETECTED (A) NOT DETECTED Final    Comment: (NOTE)                  Client Requested Flag This nucleic acid amplification test was developed and its performance characteristics determined by [employer]. Nucleic acid amplification tests include PCR and TMA. This test has not been FDA cleared or approved. This test has been authorized by FDA under an Emergency Use Authorization (EUA). This test is only authorized for the duration of time the declaration that circumstances exist justifying the authorization of the emergency use of in vitro diagnostic tests for detection of SARS-CoV-2 virus and/or diagnosis of COVID-19 infection under section 564(b)(1) of the Act, 21 U.S.C. 505LZJ-6(B) (1), unless the authorization is terminated or revoked sooner. When diagnostic testing is negative, the possibility of a false negative result should be considered in the context of a  patient's recent exposures and the presence of clinical signs and symptoms consistent with COVID-19. An individual without symptoms of COVID-  19 and who is not shedding SARS-CoV-2 virus would expect to have a negative (not detected) result in this assay. Performed At: [facility] [address] [place], [place] [id number] Rush Farmer MD IO:[id number]    [place]  Final    Comment: Performed at [facility], [place] [address] [place], [place] [address]  Blood Culture (routine x 2)     Status: None (Preliminary result)   Collection Time: 10/25/19  9:05 PM   Specimen: BLOOD LEFT HAND  Result Value Ref Range Status   Specimen Description BLOOD LEFT HAND  Final   Special Requests   Final    BOTTLES DRAWN AEROBIC AND ANAEROBIC Blood Culture adequate volume   Culture   Final    NO GROWTH < 12 HOURS Performed at [facility], [address], [place], [place] [address]  Report Status PENDING  Incomplete  Blood Culture (routine x 2)     Status: None (Preliminary result)   Collection Time: 10/25/19  9:16 PM   Specimen: BLOOD RIGHT ARM  Result Value Ref Range Status   Specimen Description BLOOD RIGHT ARM  Final   Special Requests   Final    BOTTLES DRAWN AEROBIC AND ANAEROBIC Blood Culture adequate volume   Culture   Final    NO GROWTH < 12 HOURS Performed at [facility], [address], [place], [place] [address]    Report Status PENDING  Incomplete     Radiology Studies: CT Head Wo Contrast  Result Date: 10/25/2019 CLINICAL DATA:  Altered mental status. Coronavirus infection. EXAM: CT HEAD WITHOUT CONTRAST TECHNIQUE: Contiguous axial images were obtained from the base of the skull through the vertex without intravenous contrast. COMPARISON:  12/27/2008 FINDINGS: Brain: Age related atrophy. Chronic small-vessel ischemic changes of the white matter. No sign of acute infarction, mass lesion, hemorrhage, hydrocephalus or extra-axial collection.  Vascular: There is atherosclerotic calcification of the major vessels at the base of the brain. Skull: Negative Sinuses/Orbits: Clear/normal Other: None IMPRESSION: No acute finding by CT. Age related atrophy and chronic small-vessel ischemic changes of the white matter. Electronically Signed   By: Nelson Chimes M.D.   On: 10/25/2019 20:16   CT Chest Wo Contrast  Result Date: 10/25/2019 CLINICAL DATA:  Altered mental status. Coronavirus infection. EXAM: CT CHEST WITHOUT CONTRAST TECHNIQUE: Multidetector CT imaging of the chest was performed following the standard protocol without IV contrast. COMPARISON:  Chest radiography same day. FINDINGS: Cardiovascular: Mild cardiomegaly. Previous median sternotomy. Aortic atherosclerosis. Coronary artery calcification. Mediastinum/Nodes: No hilar mass or adenopathy suspected on this noncontrast study. Lungs/Pleura: Background emphysema. Widespread bilateral patchy pulmonary infiltrates, more extensive in the right lung than the left, affecting all lobes, consistent with the clinical diagnosis of viral pneumonia. No pleural effusion. Upper Abdomen: Normal Musculoskeletal: Chronic degenerative changes of the spine. IMPRESSION: 1. Widespread bilateral patchy pulmonary infiltrates, more extensive in the right lung than the left, consistent with the clinical diagnosis of viral pneumonia. 2. Emphysema and aortic atherosclerosis. Aortic Atherosclerosis (ICD10-I70.0) and Emphysema (ICD10-J43.9). Electronically Signed   By: Nelson Chimes M.D.   On: 10/25/2019 20:20   DG Chest Port 1 View  Result Date: 10/25/2019 CLINICAL DATA:  Shortness of breath and COVID-19 positivity EXAM: PORTABLE CHEST 1 VIEW COMPARISON:  10/20/2011 FINDINGS: Cardiac shadow is enlarged in size and stable. Aortic calcifications are noted. New diffuse bilateral infiltrates are seen right greater than left significantly increased when compared with the prior exam. These changes are consistent with the given  clinical history of COVID-19 positivity. Postsurgical changes are noted. No acute bony abnormality is seen. IMPRESSION: New diffuse bilateral infiltrative changes right greater than left consistent with the given history. Electronically Signed   By: Inez Catalina M.D.   On: 10/25/2019 19:53    Scheduled Meds: . atorvastatin  40 mg Oral Daily  . carvedilol  3.125 mg Oral BID WC  . dexamethasone (DECADRON) injection  6 mg Intravenous Q12H  . donepezil  10 mg Oral Daily  . furosemide  20 mg Intravenous Q12H  . Ipratropium-Albuterol  1 puff Inhalation Q6H  . pregabalin  75 mg Oral QODAY  . sodium chloride flush  3 mL Intravenous Q12H  . umeclidinium-vilanterol  1 puff Inhalation Daily  . vitamin C  500 mg Oral Daily  . Warfarin - Pharmacist Dosing Inpatient   Does not apply q1800  .  zinc sulfate  220 mg Oral Daily   Continuous Infusions: . sodium chloride    . [START ON 10/27/2019] remdesivir 100 mg in NS 100 mL       LOS: 1 day    Time spent: 35 minutes. Greater than 50% of this time was spent in direct contact with the patient, coordinating care and discussing relevant ongoing clinical issues, including sepsis and acute on chronic respiratory failure with hypoxia in the setting of Covid 19 infection and acute on chronic diastolic CHF exacerbation.  Patient with history of chronic COPD but currently no wheezing.  Patient started on steroids, remdesivir and gentle diuresis.  Elevated troponin in the setting of demand ischemia/type II NSTEMI, discussed with cardiology (Dr. Bronson Ing) with recommendation for conservative management and no candidate for catheterization.  Patient will have been continue on a statins and adjusted dose of beta-blocker.  She will be transfer to [facility] for further evaluation and management of her Covid infection.  Currently afebrile but still requiring around 5-6 L high flow nasal cannula supplementation.     Barton Dubois, MD [facility] Pager [phone]   10/26/2019, 8:06 AM

## 2019-10-26 NOTE — Consult Note (Signed)
   [facility] CM Inpatient Consult   10/26/2019  GORDANA KEWLEY 1926/07/03 [id number]   Referral: Active [facility] status  Made aware of patient's admission from [place] with [place] in the [place].  Patient is currently active with [facility] for chronic disease management services with a [place]. Patient arriving at [facility].  Plan: Following active patient for progress and disposition for post hospital follow up needs.  Primary Care Provider:  Dr. Sinda Du   Of note, [facility] services does not replace or interfere with any services that are needed or arranged by inpatient [facility] care management team.  For additional questions or referrals please contact:  Natividad Brood, RN BSN [facility] Liaison  [phone] business mobile phone Toll free office [phone]  Fax number: [phone] [email or web address]

## 2019-10-26 NOTE — ED Notes (Signed)
Report given to [facility] with [place]. ETA 25 min

## 2019-10-26 NOTE — ED Notes (Signed)
Pt titrated down to 5L NRB.

## 2019-10-26 NOTE — ED Notes (Signed)
Pt decreased to 10L NRB

## 2019-10-26 NOTE — Progress Notes (Signed)
Patient arrived to unit in NAD. VS stable and patient free from pain. Room 1W 144

## 2019-10-26 NOTE — ED Notes (Signed)
Pts grand daughter [name] states that pt is from home and has a caregiver that lives with her. Pt is NOT from [facility] as previously stated in the triage noted. MD notified.

## 2019-10-26 NOTE — Progress Notes (Signed)
ANTICOAGULATION CONSULT NOTE - Pharmacy Consult for Coumadin  Indication: s/p St Jude AVR  Allergies  Allergen Reactions  . Codeine Nausea And Vomiting  . Penicillins Other (See Comments)    Unknown reaction - listed on MAR Has patient had a PCN reaction causing immediate rash, facial/tongue/throat swelling, SOB or lightheadedness with hypotension: Unknown Has patient had a PCN reaction causing severe rash involving mucus membranes or skin necrosis: Unknown Has patient had a PCN reaction that required hospitalization: Unknown Has patient had a PCN reaction occurring within the last 10 years: Unknown If all of the above answers are "NO", then may proceed with Cephalosporin use.    Patient Measurements:    Vital Signs: Temp: 99.9 F (37.7 C) (12/11 1045) BP: 143/51 (12/11 1030) Pulse Rate: 65 (12/11 1045)  Labs: Recent Labs    10/25/19 1907 10/25/19 1919 10/25/19 2116 10/26/19 0525 10/26/19 0817  HGB 11.5* 12.6  --   --   --   HCT 38.7 37.0  --   --   --   PLT 205  --   --   --   --   LABPROT 37.8*  --   --  42.6*  --   INR 3.8*  --   --  4.5*  --   CREATININE 1.45* 1.40*  --   --   --   TROPONINIHS 105*  --  547* 1,861* 1,670*    CrCl cannot be calculated (Unknown ideal weight.).   Medical History: Past Medical History:  Diagnosis Date  . Carotid artery stenosis    Without infarction  . Chronic combined systolic (congestive) and diastolic (congestive) heart failure ([place])   . COPD (chronic obstructive pulmonary disease) ([place])   . Coronary artery disease   . Heart valve replaced by other means   . Hypercholesterolemia    Pure  . Hypertension    Unspecified  . LBBB (left bundle branch block)   . Macular degeneration (senile) of retina, unspecified   . On home O2   . Postsurgical aortocoronary bypass status   . Stroke ([place])   . Transient global amnesia   . Unspecified hereditary and idiopathic peripheral neuropathy   . Unspecified vitamin D deficiency      Medications:  See electronic med rec  Assessment: 85 y.o. F presents with COVID-19. Pt on coumadin PTA for St Jude AVR. Admission INR supratherapeutic (3.8). INR this AM 4.5  Home dose: 2.5mg  daily - has been holding with doxycycline on board   Goal of Therapy:  INR 2-3 Monitor platelets by anticoagulation protocol: Yes   Plan:  No coumadin today Daily PT/INR Monitor for S/S of bleeding  Isac Sarna, BS [place]ena [place], BCPS Clinical Pharmacist Pager [phone] 10/26/2019,11:10 AM

## 2019-10-27 DIAGNOSIS — I509 Heart failure, unspecified: Secondary | ICD-10-CM

## 2019-10-27 LAB — COMPREHENSIVE METABOLIC PANEL
ALT: 57 U/L — ABNORMAL HIGH (ref 0–44)
AST: 76 U/L — ABNORMAL HIGH (ref 15–41)
Albumin: 3.2 g/dL — ABNORMAL LOW (ref 3.5–5.0)
Alkaline Phosphatase: 60 U/L (ref 38–126)
Anion gap: 16 — ABNORMAL HIGH (ref 5–15)
BUN: 47 mg/dL — ABNORMAL HIGH (ref 8–23)
CO2: 24 mmol/L (ref 22–32)
Calcium: 8.4 mg/dL — ABNORMAL LOW (ref 8.9–10.3)
Chloride: 104 mmol/L (ref 98–111)
Creatinine, Ser: 1.47 mg/dL — ABNORMAL HIGH (ref 0.44–1.00)
GFR calc Af Amer: 36 mL/min — ABNORMAL LOW (ref >60)
GFR calc non Af Amer: 31 mL/min — ABNORMAL LOW (ref >60)
Glucose, Bld: 100 mg/dL — ABNORMAL HIGH (ref 70–99)
Potassium: 3.6 mmol/L (ref 3.5–5.1)
Sodium: 144 mmol/L (ref 135–145)
Total Bilirubin: 1 mg/dL (ref 0.3–1.2)
Total Protein: 6 g/dL — ABNORMAL LOW (ref 6.5–8.1)

## 2019-10-27 LAB — CBC WITH DIFFERENTIAL/PLATELET
Abs Immature Granulocytes: 0.03 10*3/uL (ref 0.00–0.07)
Basophils Absolute: 0 10*3/uL (ref 0.0–0.1)
Basophils Relative: 0 %
Eosinophils Absolute: 0 10*3/uL (ref 0.0–0.5)
Eosinophils Relative: 0 %
HCT: 34.4 % — ABNORMAL LOW (ref 36.0–46.0)
Hemoglobin: 11.1 g/dL — ABNORMAL LOW (ref 12.0–15.0)
Immature Granulocytes: 0 %
Lymphocytes Relative: 9 %
Lymphs Abs: 0.9 10*3/uL (ref 0.7–4.0)
MCH: 31.4 pg (ref 26.0–34.0)
MCHC: 32.3 g/dL (ref 30.0–36.0)
MCV: 97.5 fL (ref 80.0–100.0)
Monocytes Absolute: 0.1 10*3/uL (ref 0.1–1.0)
Monocytes Relative: 1 %
Neutro Abs: 8.5 10*3/uL — ABNORMAL HIGH (ref 1.7–7.7)
Neutrophils Relative %: 90 %
Platelets: 142 10*3/uL — ABNORMAL LOW (ref 150–400)
RBC: 3.53 MIL/uL — ABNORMAL LOW (ref 3.87–5.11)
RDW: 12.8 % (ref 11.5–15.5)
WBC: 9.5 10*3/uL (ref 4.0–10.5)
nRBC: 0 % (ref 0.0–0.2)

## 2019-10-27 LAB — URINE CULTURE
Culture: NO GROWTH
Special Requests: NORMAL

## 2019-10-27 LAB — GLUCOSE, CAPILLARY
Glucose-Capillary: 102 mg/dL — ABNORMAL HIGH (ref 70–99)
Glucose-Capillary: 131 mg/dL — ABNORMAL HIGH (ref 70–99)
Glucose-Capillary: 131 mg/dL — ABNORMAL HIGH (ref 70–99)
Glucose-Capillary: 145 mg/dL — ABNORMAL HIGH (ref 70–99)

## 2019-10-27 LAB — HEMOGLOBIN A1C
Hgb A1c MFr Bld: 5.7 % — ABNORMAL HIGH (ref 4.8–5.6)
Mean Plasma Glucose: 116.89 mg/dL

## 2019-10-27 LAB — PROTIME-INR
INR: 3.5 — ABNORMAL HIGH (ref 0.8–1.2)
Prothrombin Time: 35.3 seconds — ABNORMAL HIGH (ref 11.4–15.2)

## 2019-10-27 LAB — FERRITIN: Ferritin: 4132 ng/mL — ABNORMAL HIGH (ref 11–307)

## 2019-10-27 LAB — D-DIMER, QUANTITATIVE: D-Dimer, Quant: 3 ug/mL-FEU — ABNORMAL HIGH (ref 0.00–0.50)

## 2019-10-27 LAB — C-REACTIVE PROTEIN: CRP: 13 mg/dL — ABNORMAL HIGH (ref <1.0)

## 2019-10-27 MED ORDER — ALPRAZOLAM 0.25 MG PO TABS
0.2500 mg | ORAL_TABLET | Freq: Every evening | ORAL | Status: DC | PRN
  Administered 2019-10-27 – 2019-10-29 (×3): 0.25 mg via ORAL
  Filled 2019-10-27 (×3): qty 1

## 2019-10-27 MED ORDER — INSULIN ASPART 100 UNIT/ML ~~LOC~~ SOLN
0.0000 [IU] | Freq: Three times a day (TID) | SUBCUTANEOUS | Status: DC
  Administered 2019-10-27 – 2019-10-28 (×4): 1 [IU] via SUBCUTANEOUS
  Administered 2019-10-28: 3 [IU] via SUBCUTANEOUS
  Administered 2019-10-29: 2 [IU] via SUBCUTANEOUS
  Administered 2019-10-29 – 2019-10-30 (×3): 1 [IU] via SUBCUTANEOUS
  Administered 2019-10-30: 2 [IU] via SUBCUTANEOUS

## 2019-10-27 MED ORDER — WARFARIN 0.5 MG HALF TABLET
0.5000 mg | ORAL_TABLET | Freq: Once | ORAL | Status: AC
  Administered 2019-10-27: 0.5 mg via ORAL
  Filled 2019-10-27: qty 1

## 2019-10-27 NOTE — Progress Notes (Addendum)
PROGRESS NOTE                                                                                                                                                                                                             Patient Demographics:    [name], is a 85 y.o. female, DOB - [birth year], ZOX:[id number]  Outpatient Primary MD for the patient is Kari BaarsHawkins, Edward, MD   Admit date - 10/25/2019   LOS - 2  Chief Complaint  Patient presents with  . Shortness of Breath       Brief Narrative: Patient is a 85 y.o. female with PMHx of chronic systolic heart failure, CAD s/p CABG, mechanical aortic valve on anticoagulation, COPD on 3 L of oxygen at home-presented with shortness of breath-found to have acute hypoxic respiratory failure in the setting of COVID-19 pneumonia and decompensated systolic heart failure   Subjective:    [name] today appears stable-denies any shortness of breath at rest.  She does not have any chest pain.  She vomited a few times overnight-nauseous this morning.   Assessment  & Plan :   Acute on chronic hypoxic Resp Failure due to Covid 19 Viral pneumonia and decompensated systolic heart failure (EF 25-30% March 2020): Improved with diuretics, remdesivir/steroids.  Volume status is now stable-continue close monitoring.  Follow inflammatory markers/volume status closely.  Fever: afebrile  O2 requirements:  SpO2: 97 % O2 Flow Rate (L/min): 2.5 L/min   COVID-19 Labs: Recent Labs    10/25/19 1907 10/26/19 0525 10/27/19 0037  DDIMER 2.47* 2.86* 3.00*  FERRITIN 702* >7,500* 4,132*  LDH 253*  --   --   CRP 0.6 4.4* 13.0*       Component Value Date/Time   BNP 1,998.0 (H) 10/25/2019 1907    Recent Labs  Lab 10/25/19 1907  PROCALCITON <0.10    Lab Results  Component Value Date   SARSCOV2NAA DETECTED (A) 10/20/2019     COVID-19 Medications: Steroids: 12/11>> Remdesivir:  12/10>>  Other medications: Diuretics:Euvolemic-continue IV Lasix-maintain negative balance-follow weights/intake output/electrolytes Antibiotics:Not needed as no evidence of bacterial infection Insulin: CBG stable with SSI.  A1c 5.7 on 12/12.  Prone/Incentive Spirometry: encouraged  incentive spirometry use 3-4/hour.  DVT Prophylaxis  : Coumadin  Sepsis: Secondary to COVID-19-sepsis physiology has resolved.  Nausea/Vomiting: Probably due to COVID-19-continue supportive care-as needed antiemetics.  Elevated troponin: Secondary demand ischemia-prior hospitalist Dr. Marinus Maw with cardiology-recommendations are for conservative management.  Transaminitis: Appears mild-secondary to COVID-19-continue to follow  AKI: Mild-continue to follow.  COPD-on 3 L of oxygen at home: No signs of exacerbation-continue bronchodilators  CAD s/p CABG: No anginal symptoms-see above regarding troponins  History of aortic valve replacement-mechanical valve: On Coumadin-INR supratherapeutic-pharmacy following.INR goal 2.5-3.5  Dementia: Appears to be mild-continue Aricept  RN pressure injury documentation: Pressure Injury 07/23/18 Stage I -  Intact skin with non-blanchable redness of a localized area usually over a bony prominence. (Active)  07/23/18 1821  Location: Ischial tuberosity  Location Orientation: Medial  Staging: Stage I -  Intact skin with non-blanchable redness of a localized area usually over a bony prominence.  Wound Description (Comments):   Present on Admission: Yes     Pressure Injury 10/26/19 Buttocks Medial Stage I -  Intact skin with non-blanchable redness of a localized area usually over a bony prominence. (Active)  10/26/19 1315  Location: Buttocks  Location Orientation: Medial  Staging: Stage I -  Intact skin with non-blanchable redness of a localized area usually over a bony prominence.  Wound Description (Comments):   Present on Admission: Yes   Consults  :   None  Procedures  :  None  ABG:    Component Value Date/Time   PHART 7.174 (LL) 10/25/2019 2007   PCO2ART 74.4 (HH) 10/25/2019 2007   PO2ART 85.3 10/25/2019 2007   HCO3 21.4 10/25/2019 2007   TCO2 26 10/25/2019 1919   ACIDBASEDEF 1.3 10/25/2019 2007   O2SAT 93.0 10/25/2019 2007    Vent Settings: N/A   Condition -Stable  Family Communication  :  Daughter/grand-daughter updated over the phone  Code Status :  DNR  Diet :  Diet Order            Diet Heart Room service appropriate? Yes; Fluid consistency: Thin  Diet effective now               Disposition Plan  :  Remain hospitalized  Barriers to discharge: Hypoxia requiring O2 supplementation/complete 5 days of IV Remdesivir  Antimicorbials  :    Anti-infectives (From admission, onward)   Start     Dose/Rate Route Frequency Ordered Stop   10/27/19 1000  remdesivir 100 mg in sodium chloride 0.9 % 100 mL IVPB  Status:  Discontinued     100 mg 200 mL/hr over 30 Minutes Intravenous Daily 10/26/19 0601 10/26/19 0627   10/27/19 1000  remdesivir 100 mg in sodium chloride 0.9 % 100 mL IVPB  Status:  Discontinued     100 mg 200 mL/hr over 30 Minutes Intravenous Daily 10/26/19 0214 10/26/19 0629   10/27/19 1000  remdesivir 100 mg in sodium chloride 0.9 % 100 mL IVPB     100 mg 200 mL/hr over 30 Minutes Intravenous Daily 10/26/19 0628 10/31/19 0959   10/26/19 1000  remdesivir 100 mg in sodium chloride 0.9 % 100 mL IVPB  Status:  Discontinued     100 mg 200 mL/hr over 30 Minutes Intravenous Daily 10/25/19 2234 10/26/19 0214   10/26/19 0615  remdesivir 200 mg in sodium chloride 0.9% 250 mL IVPB  Status:  Discontinued     200 mg 580 mL/hr over 30 Minutes Intravenous Once 10/26/19 0601 10/26/19 0627   10/25/19 2330  remdesivir 200 mg in sodium chloride 0.9% 250 mL IVPB     200 mg 580 mL/hr over 30 Minutes Intravenous Once 10/25/19 2234 10/26/19 0602  Inpatient Medications  Scheduled Meds: . atorvastatin  40 mg Oral  Daily  . carvedilol  3.125 mg Oral BID WC  . Chlorhexidine Gluconate Cloth  6 each Topical Daily  . dexamethasone (DECADRON) injection  6 mg Intravenous Q12H  . donepezil  10 mg Oral Daily  . furosemide  20 mg Intravenous Q12H  . insulin aspart  0-9 Units Subcutaneous TID WC  . mouth rinse  15 mL Mouth Rinse BID  . pregabalin  75 mg Oral QODAY  . sodium chloride flush  3 mL Intravenous Q12H  . umeclidinium-vilanterol  1 puff Inhalation Daily  . vitamin C  500 mg Oral Daily  . warfarin  0.5 mg Oral ONCE-1800  . Warfarin - Pharmacist Dosing Inpatient   Does not apply q1800  . zinc sulfate  220 mg Oral Daily   Continuous Infusions: . sodium chloride    . remdesivir 100 mg in NS 100 mL 100 mg (10/27/19 0823)   PRN Meds:.sodium chloride, acetaminophen, hydrocortisone, Ipratropium-Albuterol, ondansetron **OR** ondansetron (ZOFRAN) IV, sodium chloride flush, traMADol   Time Spent in minutes  25  See all Orders from today for further details   Jeoffrey Massed M.D on 10/27/2019 at 12:15 PM  To page go to [email or web address] - use universal password  [facility] -  Office  [phone]    Objective:   Vitals:   10/27/19 0000 10/27/19 0400 10/27/19 0752 10/27/19 1130  BP: (!) 121/44 (!) 135/42 (!) 148/50 (!) 119/49  Pulse: 62 (!) 58 66 71  Resp: Temp: 98.4 F (36.9 C) 98.9 F (37.2 C) 97.9 F (36.6 C) 98.1 F (36.7 C)  TempSrc: Oral Oral Oral Oral  SpO2: 96% 98% 97% 97%    Wt Readings from Last 3 Encounters:  09/11/19 54 kg  07/17/19 51.7 kg  04/05/19 49.4 kg     Intake/Output Summary (Last 24 hours) at 10/27/2019 1215 Last data filed at 10/27/2019 0815 Gross per 24 hour  Intake 483 ml  Output 1600 ml  Net -1117 ml     Physical Exam Gen Exam:Alert awake-not in any distress HEENT:atraumatic, normocephalic Chest: B/L clear to auscultation anteriorly CVS:S1S2 regular Abdomen:soft non tender, non distended Extremities:no edema Neurology: Non  focal Skin: no rash   Data Review:    CBC Recent Labs  Lab 10/25/19 1907 10/25/19 1919 10/27/19 0037  WBC 12.3*  --  9.5  HGB 11.5* 12.6 11.1*  HCT 38.7 37.0 34.4*  PLT 205  --  142*  MCV 106.3*  --  97.5  MCH 31.6  --  31.4  MCHC 29.7*  --  32.3  RDW 12.7  --  12.8  LYMPHSABS 3.9  --  0.9  MONOABS 0.5  --  0.1  EOSABS 0.0  --  0.0  BASOSABS 0.0  --  0.0    Chemistries  Recent Labs  Lab 10/25/19 1907 10/25/19 1919 10/27/19 0037  NA 141 142 144  K 3.6 3.6 3.6  CL 105 105 104  CO2 25  --  24  GLUCOSE 238* 226* 100*  BUN 32* 30* 47*  CREATININE 1.45* 1.40* 1.47*  CALCIUM 8.2*  --  8.4*  AST 41  --  76*  ALT 34  --  57*  ALKPHOS 73  --  60  BILITOT 0.5  --  1.0   ------------------------------------------------------------------------------------------------------------------ Recent Labs    10/25/19 1907  TRIG 136    Lab Results  Component Value Date   HGBA1C 5.7 (H)  10/27/2019   ------------------------------------------------------------------------------------------------------------------ No results for input(s): TSH, T4TOTAL, T3FREE, THYROIDAB in the last 72 hours.  Invalid input(s): FREET3 ------------------------------------------------------------------------------------------------------------------ Recent Labs    10/26/19 0525 10/27/19 0037  FERRITIN >7,500* 4,132*    Coagulation profile Recent Labs  Lab 10/25/19 1907 10/26/19 0525 10/27/19 0037  INR 3.8* 4.5* 3.5*    Recent Labs    10/26/19 0525 10/27/19 0037  DDIMER 2.86* 3.00*    Cardiac Enzymes No results for input(s): CKMB, TROPONINI, MYOGLOBIN in the last 168 hours.  Invalid input(s): CK ------------------------------------------------------------------------------------------------------------------    Component Value Date/Time   BNP 1,998.0 (H) 10/25/2019 1907    Micro Results Recent Results (from the past 240 hour(s))  Novel Coronavirus, NAA (Hosp order,  Send-out to Ref Lab; TAT 18-24 hrs     Status: Abnormal   Collection Time: 10/20/19  1:31 PM   Specimen: Nasopharyngeal Swab; Respiratory  Result Value Ref Range Status   SARS-CoV-2, NAA DETECTED (A) NOT DETECTED Final    Comment: (NOTE)                  Client Requested Flag This nucleic acid amplification test was developed and its performance characteristics determined by [employer]. Nucleic acid amplification tests include PCR and TMA. This test has not been FDA cleared or approved. This test has been authorized by FDA under an Emergency Use Authorization (EUA). This test is only authorized for the duration of time the declaration that circumstances exist justifying the authorization of the emergency use of in vitro diagnostic tests for detection of SARS-CoV-2 virus and/or diagnosis of COVID-19 infection under section 564(b)(1) of the Act, 21 U.S.C. 353IRW-4(R) (1), unless the authorization is terminated or revoked sooner. When diagnostic testing is negative, the possibility of a false negative result should be considered in the context of a patient's recent exposures and the presence of clinical signs and symptoms consistent with COVID-19. An individual without symptoms of COVID-  19 and who is not shedding SARS-CoV-2 virus would expect to have a negative (not detected) result in this assay. Performed At: [facility] [address] [place], [place] [id number] Rush Farmer MD PP:[id number]    [place]  Final    Comment: Performed at [facility], [place] [address], [place], [place] [address]  Urine culture     Status: None   Collection Time: 10/25/19  8:52 PM   Specimen: Urine, Catheterized  Result Value Ref Range Status   Specimen Description   Final    URINE, CATHETERIZED Performed at [facility], [address], [place], [place] [address]    Special Requests   Final    Normal Performed at [facility], [address], [place], [place] [address]    Culture   Final    NO GROWTH Performed at [facility], [place] [address] [place], [place] [address]    Report Status 10/27/2019 FINAL  Final  Blood Culture (routine x 2)     Status: None (Preliminary result)   Collection Time: 10/25/19  9:05 PM   Specimen: BLOOD LEFT HAND  Result Value Ref Range Status   Specimen Description BLOOD LEFT HAND  Final   Special Requests   Final    BOTTLES DRAWN AEROBIC AND ANAEROBIC Blood Culture adequate volume   Culture   Final    NO GROWTH 2 DAYS Performed at [facility], [address] [place], [place] [address]    Report Status PENDING  Incomplete  Blood Culture (routine  x 2)     Status: None (Preliminary result)   Collection Time: 10/25/19  9:16 PM   Specimen: BLOOD RIGHT ARM  Result Value Ref Range Status   Specimen Description BLOOD RIGHT ARM  Final   Special Requests   Final    BOTTLES DRAWN AEROBIC AND ANAEROBIC Blood Culture adequate volume   Culture   Final    NO GROWTH 2 DAYS Performed at [facility], [address], [place], [place] [address]    Report Status PENDING  Incomplete    Radiology Reports DG Chest 2 View  Result Date: 10/20/2019 CLINICAL DATA:  Fatigue, cough and congestion since yesterday, former smoker, COPD, CHF, hypertension, dilated cardiomyopathy, prior stenting EXAM: CHEST - 2 VIEW COMPARISON:  07/16/2018 FINDINGS: Enlargement of cardiac silhouette post CABG and AVR. Mediastinal contours and pulmonary vascularity normal. Atherosclerotic calcification aorta. Emphysematous and bronchitic changes consistent with COPD. Minimal basilar atelectasis and small bibasilar pleural effusions, greater on RIGHT. No acute infiltrate or pneumothorax. Bones demineralized with chronic appearing height losses of adjacent midthoracic vertebra. IMPRESSION: Enlargement of cardiac silhouette post CABG and AVR. COPD changes with small bibasilar pleural effusions and minimal atelectasis, greater on RIGHT.  Aortic Atherosclerosis (ICD10-I70.0) and Emphysema (ICD10-J43.9). Electronically Signed   By: Ulyses Southward M.D.   On: 10/20/2019 13:47   CT Head Wo Contrast  Result Date: 10/25/2019 CLINICAL DATA:  Altered mental status. Coronavirus infection. EXAM: CT HEAD WITHOUT CONTRAST TECHNIQUE: Contiguous axial images were obtained from the base of the skull through the vertex without intravenous contrast. COMPARISON:  12/27/2008 FINDINGS: Brain: Age related atrophy. Chronic small-vessel ischemic changes of the white matter. No sign of acute infarction, mass lesion, hemorrhage, hydrocephalus or extra-axial collection. Vascular: There is atherosclerotic calcification of the major vessels at the base of the brain. Skull: Negative Sinuses/Orbits: Clear/normal Other: None IMPRESSION: No acute finding by CT. Age related atrophy and chronic small-vessel ischemic changes of the white matter. Electronically Signed   By: Paulina Fusi M.D.   On: 10/25/2019 20:16   CT Chest Wo Contrast  Result Date: 10/25/2019 CLINICAL DATA:  Altered mental status. Coronavirus infection. EXAM: CT CHEST WITHOUT CONTRAST TECHNIQUE: Multidetector CT imaging of the chest was performed following the standard protocol without IV contrast. COMPARISON:  Chest radiography same day. FINDINGS: Cardiovascular: Mild cardiomegaly. Previous median sternotomy. Aortic atherosclerosis. Coronary artery calcification. Mediastinum/Nodes: No hilar mass or adenopathy suspected on this noncontrast study. Lungs/Pleura: Background emphysema. Widespread bilateral patchy pulmonary infiltrates, more extensive in the right lung than the left, affecting all lobes, consistent with the clinical diagnosis of viral pneumonia. No pleural effusion. Upper Abdomen: Normal Musculoskeletal: Chronic degenerative changes of the spine. IMPRESSION: 1. Widespread bilateral patchy pulmonary infiltrates, more extensive in the right lung than the left, consistent with the clinical diagnosis  of viral pneumonia. 2. Emphysema and aortic atherosclerosis. Aortic Atherosclerosis (ICD10-I70.0) and Emphysema (ICD10-J43.9). Electronically Signed   By: Paulina Fusi M.D.   On: 10/25/2019 20:20   DG Chest Port 1 View  Result Date: 10/25/2019 CLINICAL DATA:  Shortness of breath and COVID-19 positivity EXAM: PORTABLE CHEST 1 VIEW COMPARISON:  10/20/2011 FINDINGS: Cardiac shadow is enlarged in size and stable. Aortic calcifications are noted. New diffuse bilateral infiltrates are seen right greater than left significantly increased when compared with the prior exam. These changes are consistent with the given clinical history of COVID-19 positivity. Postsurgical changes are noted. No acute bony abnormality is seen. IMPRESSION: New diffuse bilateral infiltrative changes right greater than left consistent with the given  history. Electronically Signed   By: Alcide Clever M.D.   On: 10/25/2019 19:53

## 2019-10-27 NOTE — Progress Notes (Signed)
   10/27/19 1600  Family/Significant Other Communication  Family/Significant Other Update Called;Updated (daughter )

## 2019-10-27 NOTE — Progress Notes (Signed)
ANTICOAGULATION CONSULT NOTE - Pharmacy Consult for Coumadin  Indication: s/p St Jude AVR   Vital Signs: Temp: 98.1 F (36.7 C) (12/12 1130) Temp Source: Oral (12/12 1130) BP: 119/49 (12/12 1130) Pulse Rate: 71 (12/12 1130)  Labs: Recent Labs    10/25/19 1907 10/25/19 1919 10/25/19 2116 10/26/19 0525 10/26/19 0817 10/27/19 0037  HGB 11.5* 12.6  --   --   --  11.1*  HCT 38.7 37.0  --   --   --  34.4*  PLT 205  --   --   --   --  142*  LABPROT 37.8*  --   --  42.6*  --  35.3*  INR 3.8*  --   --  4.5*  --  3.5*  CREATININE 1.45* 1.40*  --   --   --  1.47*  TROPONINIHS 105*  --  547* 1,861* 1,670*  --      Medical History: Past Medical History:  Diagnosis Date  . Carotid artery stenosis    Without infarction  . Chronic combined systolic (congestive) and diastolic (congestive) heart failure ([place])   . COPD (chronic obstructive pulmonary disease) ([place])   . Coronary artery disease   . Heart valve replaced by other means   . Hypercholesterolemia    Pure  . Hypertension    Unspecified  . LBBB (left bundle branch block)   . Macular degeneration (senile) of retina, unspecified   . On home O2   . Postsurgical aortocoronary bypass status   . Stroke ([place])   . Transient global amnesia   . Unspecified hereditary and idiopathic peripheral neuropathy   . Unspecified vitamin D deficiency      Assessment: 85 y.o. F presents with COVID-19. Pt on coumadin PTA for St Jude AVR. Admission INR supratherapeutic, INR remains high despite no warfarin in three days. INR today 3.5. Outpatient INR goal 2.2-3. INR  Home dose: 2.5mg  daily  Goal of Therapy:  INR 2-3 Monitor platelets by anticoagulation protocol: Yes   Plan:  Warfarin 0.5 mg po x1 Will give small dose, in anticipation of INR falling again tonight + hypercoaguable predisposition with Covid-19 virus Daily PT/INR Monitor for S/S of bleeding    Harvel Quale 10/27/2019 11:38 AM

## 2019-10-27 NOTE — Evaluation (Signed)
Physical Therapy Evaluation Patient Details Name: [name] MRN: [id number] DOB: 1926/08/23 Today's Date: 10/27/2019   History of Present Illness  85 y.o. female with PMHx of chronic systolic heart failure, CAD s/p CABG, mechanical aortic valve on anticoagulation, COPD on 3 L of oxygen at home-presented to ED 10/25/19 with acute hypoxic respiratory failure in the setting of COVID-19 pneumonia and decompensated systolic heart failure. Pt was diagnosed with COVID 10/20/19 and remained at home. 10/25/19 underwent bamlanivimab infusion at outpt infusion clinic and discharged to home.  Clinical Impression   Pt admitted with above diagnosis. Patient slightly unsteady and required min assist to correct losses of balance (after refusing use of RW). Patient reports does not normally use a device. Can benefit from PT, including education on use of DME if she will agree to use. Pt currently with functional limitations due to the deficits listed below (see PT Problem List). Pt will benefit from skilled PT to increase their independence and safety with mobility to allow discharge to the venue listed below.       Follow Up Recommendations Home health PT;Supervision/Assistance - 24 hour    Equipment Recommendations  Rolling walker with 5" wheels    Recommendations for Other Services OT consult     Precautions / Restrictions Precautions Precautions: Fall Restrictions Weight Bearing Restrictions: No      Mobility  Bed Mobility Overal bed mobility: Modified Independent             General bed mobility comments: incr time  Transfers Overall transfer level: Needs assistance Equipment used: None Transfers: Sit to/from Stand Sit to Stand: Min guard         General transfer comment: pt uses wide BOS; close guard for safety  Ambulation/Gait Ambulation/Gait assistance: Min assist Gait Distance (Feet): 100 Feet Assistive device: 1 person hand held assist Gait Pattern/deviations:  Step-through pattern;Decreased stride length;Drifts right/left     General Gait Details: refused use of RW; instead held PTs elbow; several side steps required assist to recover  Stairs            Wheelchair Mobility    Modified Rankin (Stroke Patients Only)       Balance Overall balance assessment: Mild deficits observed, not formally tested                                           Pertinent Vitals/Pain Pain Assessment: No/denies pain    Home Living Family/patient expects to be discharged to:: Private residence Living Arrangements: Non-relatives/Friends(caregiver) Available Help at Discharge: Family;Personal care attendant;Available 24 hours/day Type of Home: House       Home Layout: One level        Prior Function Level of Independence: Needs assistance   Gait / Transfers Assistance Needed: supervision 24/7 by caregiver (and on her days off, pt's daughter stays with her); no device           Hand Dominance        Extremity/Trunk Assessment   Upper Extremity Assessment Upper Extremity Assessment: Defer to OT evaluation    Lower Extremity Assessment Lower Extremity Assessment: Generalized weakness       Communication   Communication: No difficulties  Cognition Arousal/Alertness: Awake/alert Behavior During Therapy: WFL for tasks assessed/performed Overall Cognitive Status: Within Functional Limits for tasks assessed  General Comments: Patient corrected misinformation in H&P re: coming from SNF; further chart review of clinic notes indicated she was correct      General Comments General comments (skin integrity, edema, etc.): on 3L (as at home)    Exercises     Assessment/Plan    PT Assessment Patient needs continued PT services  PT Problem List Decreased strength;Decreased activity tolerance;Decreased balance;Decreased mobility;Decreased knowledge of use of  DME;Cardiopulmonary status limiting activity       PT Treatment Interventions DME instruction;Gait training;Functional mobility training;Therapeutic activities;Therapeutic exercise;Balance training;Patient/family education    PT Goals (Current goals can be found in the Care Plan section)  Acute Rehab PT Goals Patient Stated Goal: return home with caregiver, family assist PT Goal Formulation: With patient Time For Goal Achievement: 11/10/19    Frequency Min 3X/week   Barriers to discharge        Co-evaluation               AM-PAC PT "6 Clicks" Mobility  Outcome Measure Help needed turning from your back to your side while in a flat bed without using bedrails?: None Help needed moving from lying on your back to sitting on the side of a flat bed without using bedrails?: None Help needed moving to and from a bed to a chair (including a wheelchair)?: A Little Help needed standing up from a chair using your arms (e.g., wheelchair or bedside chair)?: A Little Help needed to walk in hospital room?: A Little Help needed climbing 3-5 steps with a railing? : A Little 6 Click Score: 20    End of Session Equipment Utilized During Treatment: Oxygen Activity Tolerance: Patient tolerated treatment well Patient left: in chair;with call bell/phone within reach   PT Visit Diagnosis: Unsteadiness on feet (R26.81);Difficulty in walking, not elsewhere classified (R26.2)    Time:  -    PT Time Calculation  PT Start Time (ACUTE ONLY) 1555  PT Stop Time (ACUTE ONLY) 1622  PT Time Calculation (min) (ACUTE ONLY) 27 min  PT General Charges  $$ ACUTE PT VISIT 1 Visit  PT Evaluation  $PT Eval Low Complexity 1 Low  PT Treatments  $Gait Training 8-22 mins          Veda Canning, PT Pager [phone]   Zena Amos 10/27/2019, 9:47 PM

## 2019-10-27 NOTE — Plan of Care (Signed)
No acute changes this shift. OOB to chair.   Problem: Education: Goal: Knowledge of risk factors and measures for prevention of condition will improve Outcome: Progressing   Problem: Coping: Goal: Psychosocial and spiritual needs will be supported Outcome: Progressing   Problem: Respiratory: Goal: Will maintain a patent airway Outcome: Progressing Goal: Complications related to the disease process, condition or treatment will be avoided or minimized Outcome: Progressing

## 2019-10-28 LAB — CBC WITH DIFFERENTIAL/PLATELET
Abs Immature Granulocytes: 0.02 10*3/uL (ref 0.00–0.07)
Basophils Absolute: 0 10*3/uL (ref 0.0–0.1)
Basophils Relative: 0 %
Eosinophils Absolute: 0 10*3/uL (ref 0.0–0.5)
Eosinophils Relative: 0 %
HCT: 34.2 % — ABNORMAL LOW (ref 36.0–46.0)
Hemoglobin: 11.2 g/dL — ABNORMAL LOW (ref 12.0–15.0)
Immature Granulocytes: 0 %
Lymphocytes Relative: 12 %
Lymphs Abs: 1 10*3/uL (ref 0.7–4.0)
MCH: 31.8 pg (ref 26.0–34.0)
MCHC: 32.7 g/dL (ref 30.0–36.0)
MCV: 97.2 fL (ref 80.0–100.0)
Monocytes Absolute: 0.3 10*3/uL (ref 0.1–1.0)
Monocytes Relative: 4 %
Neutro Abs: 6.9 10*3/uL (ref 1.7–7.7)
Neutrophils Relative %: 84 %
Platelets: 158 10*3/uL (ref 150–400)
RBC: 3.52 MIL/uL — ABNORMAL LOW (ref 3.87–5.11)
RDW: 13 % (ref 11.5–15.5)
WBC: 8.3 10*3/uL (ref 4.0–10.5)
nRBC: 0 % (ref 0.0–0.2)

## 2019-10-28 LAB — D-DIMER, QUANTITATIVE: D-Dimer, Quant: 1.48 ug/mL-FEU — ABNORMAL HIGH (ref 0.00–0.50)

## 2019-10-28 LAB — COMPREHENSIVE METABOLIC PANEL
ALT: 41 U/L (ref 0–44)
AST: 47 U/L — ABNORMAL HIGH (ref 15–41)
Albumin: 3.2 g/dL — ABNORMAL LOW (ref 3.5–5.0)
Alkaline Phosphatase: 58 U/L (ref 38–126)
Anion gap: 12 (ref 5–15)
BUN: 69 mg/dL — ABNORMAL HIGH (ref 8–23)
CO2: 26 mmol/L (ref 22–32)
Calcium: 8.5 mg/dL — ABNORMAL LOW (ref 8.9–10.3)
Chloride: 104 mmol/L (ref 98–111)
Creatinine, Ser: 1.6 mg/dL — ABNORMAL HIGH (ref 0.44–1.00)
GFR calc Af Amer: 33 mL/min — ABNORMAL LOW (ref >60)
GFR calc non Af Amer: 28 mL/min — ABNORMAL LOW (ref >60)
Glucose, Bld: 142 mg/dL — ABNORMAL HIGH (ref 70–99)
Potassium: 3.7 mmol/L (ref 3.5–5.1)
Sodium: 142 mmol/L (ref 135–145)
Total Bilirubin: 0.8 mg/dL (ref 0.3–1.2)
Total Protein: 5.9 g/dL — ABNORMAL LOW (ref 6.5–8.1)

## 2019-10-28 LAB — C-REACTIVE PROTEIN: CRP: 8.4 mg/dL — ABNORMAL HIGH (ref <1.0)

## 2019-10-28 LAB — FERRITIN: Ferritin: 1999 ng/mL — ABNORMAL HIGH (ref 11–307)

## 2019-10-28 LAB — PROTIME-INR
INR: 3.5 — ABNORMAL HIGH (ref 0.8–1.2)
Prothrombin Time: 35.1 seconds — ABNORMAL HIGH (ref 11.4–15.2)

## 2019-10-28 LAB — GLUCOSE, CAPILLARY
Glucose-Capillary: 135 mg/dL — ABNORMAL HIGH (ref 70–99)
Glucose-Capillary: 134 mg/dL — ABNORMAL HIGH (ref 70–99)
Glucose-Capillary: 208 mg/dL — ABNORMAL HIGH (ref 70–99)
Glucose-Capillary: 175 mg/dL — ABNORMAL HIGH (ref 70–99)

## 2019-10-28 NOTE — Progress Notes (Signed)
PROGRESS NOTE                                                                                                                                                                                                             Patient Demographics:    [name], is a 85 y.o. female, DOB - [birth year], ION:[id number]  Outpatient Primary MD for the patient is Kari Baars, MD   Admit date - 10/25/2019   LOS - 3  Chief Complaint  Patient presents with  . Shortness of Breath       Brief Narrative: Patient is a 85 y.o. female with PMHx of chronic systolic heart failure, CAD s/p CABG, mechanical aortic valve on anticoagulation, COPD on 3 L of oxygen at home-presented with shortness of breath-found to have acute hypoxic respiratory failure in the setting of COVID-19 pneumonia and decompensated systolic heart failure   Subjective:    [name] is lying comfortably in bed-she is stable on 2 L of oxygen.   Assessment  & Plan :   Acute on chronic hypoxic Resp Failure due to Covid 19 Viral pneumonia and decompensated systolic heart failure (EF 25-30% March 2020): Overall stable-she is on 2 L of oxygen.  Given mild worsening in her renal function-hold diuretics today.  Continue steroids and remdesivir.  Her volume status is stable.  Inflammatory markers are slowly downtrending.    Fever: afebrile  O2 requirements:  SpO2: 98 % O2 Flow Rate (L/min): 2 L/min   COVID-19 Labs: Recent Labs    10/25/19 1907 10/26/19 0525 10/27/19 0037 10/28/19 0505  DDIMER 2.47* 2.86* 3.00* 1.48*  FERRITIN 702* >7,500* 4,132* 1,999*  LDH 253*  --   --   --   CRP 0.6 4.4* 13.0* 8.4*       Component Value Date/Time   BNP 1,998.0 (H) 10/25/2019 1907    Recent Labs  Lab 10/25/19 1907  PROCALCITON <0.10    Lab Results  Component Value Date   SARSCOV2NAA DETECTED (A) 10/20/2019     COVID-19 Medications: Steroids:  12/11>> Remdesivir: 12/10>>  Other medications: Diuretics:Euvolemic -2.5 L of-hold Lasix due to AKI.   Antibiotics:Not needed as no evidence of bacterial infection Insulin: CBG stable with SSI.  A1c 5.7 on 12/12.  Prone/Incentive Spirometry: encouraged  incentive spirometry use 3-4/hour.  DVT Prophylaxis  : Coumadin  Sepsis:  Secondary to COVID-19-sepsis physiology has resolved.  Nausea/Vomiting: Probably due to COVID-19-continue supportive care-as needed antiemetics.  Elevated troponin: Secondary demand ischemia-prior hospitalist Dr. Marinus Maw with cardiology-recommendations are for conservative management.  Transaminitis: Appears mild-secondary to COVID-19-continue to follow  AKI: Mild-hold Lasix today-recheck electrolytes tomorrow.  COPD-on 3 L of oxygen at home: No signs of exacerbation-continue bronchodilators  CAD s/p CABG: No anginal symptoms-see above regarding troponins  History of aortic valve replacement-mechanical valve: On Coumadin-INR supratherapeutic-pharmacy following.INR goal 2.5-3.5  Dementia: Appears to be mild-continue Aricept  RN pressure injury documentation: Pressure Injury 07/23/18 Stage I -  Intact skin with non-blanchable redness of a localized area usually over a bony prominence. (Active)  07/23/18 1821  Location: Ischial tuberosity  Location Orientation: Medial  Staging: Stage I -  Intact skin with non-blanchable redness of a localized area usually over a bony prominence.  Wound Description (Comments):   Present on Admission: Yes     Pressure Injury 10/26/19 Buttocks Medial Stage I -  Intact skin with non-blanchable redness of a localized area usually over a bony prominence. (Active)  10/26/19 1315  Location: Buttocks  Location Orientation: Medial  Staging: Stage I -  Intact skin with non-blanchable redness of a localized area usually over a bony prominence.  Wound Description (Comments):   Present on Admission: Yes   Consults  :   None  Procedures  :  None  ABG:    Component Value Date/Time   PHART 7.174 (LL) 10/25/2019 2007   PCO2ART 74.4 (HH) 10/25/2019 2007   PO2ART 85.3 10/25/2019 2007   HCO3 21.4 10/25/2019 2007   TCO2 26 10/25/2019 1919   ACIDBASEDEF 1.3 10/25/2019 2007   O2SAT 93.0 10/25/2019 2007    Vent Settings: N/A   Condition -Stable  Family Communication  :  Daughter/grand daughter (is a Publishing rights manager) updated over the phone on 12/13  Code Status :  DNR  Diet :  Diet Order            Diet Heart Room service appropriate? Yes; Fluid consistency: Thin  Diet effective now               Disposition Plan  :  Remain hospitalized  Barriers to discharge: Hypoxia requiring O2 supplementation/complete 5 days of IV Remdesivir  Antimicorbials  :    Anti-infectives (From admission, onward)   Start     Dose/Rate Route Frequency Ordered Stop   10/27/19 1000  remdesivir 100 mg in sodium chloride 0.9 % 100 mL IVPB  Status:  Discontinued     100 mg 200 mL/hr over 30 Minutes Intravenous Daily 10/26/19 0601 10/26/19 0627   10/27/19 1000  remdesivir 100 mg in sodium chloride 0.9 % 100 mL IVPB  Status:  Discontinued     100 mg 200 mL/hr over 30 Minutes Intravenous Daily 10/26/19 0214 10/26/19 0629   10/27/19 1000  remdesivir 100 mg in sodium chloride 0.9 % 100 mL IVPB     100 mg 200 mL/hr over 30 Minutes Intravenous Daily 10/26/19 0628 10/31/19 0959   10/26/19 1000  remdesivir 100 mg in sodium chloride 0.9 % 100 mL IVPB  Status:  Discontinued     100 mg 200 mL/hr over 30 Minutes Intravenous Daily 10/25/19 2234 10/26/19 0214   10/26/19 0615  remdesivir 200 mg in sodium chloride 0.9% 250 mL IVPB  Status:  Discontinued     200 mg 580 mL/hr over 30 Minutes Intravenous Once 10/26/19 0601 10/26/19 0627   10/25/19 2330  remdesivir 200 mg  in sodium chloride 0.9% 250 mL IVPB     200 mg 580 mL/hr over 30 Minutes Intravenous Once 10/25/19 2234 10/26/19 0602      Inpatient Medications  Scheduled  Meds: . atorvastatin  40 mg Oral Daily  . carvedilol  3.125 mg Oral BID WC  . Chlorhexidine Gluconate Cloth  6 each Topical Daily  . dexamethasone (DECADRON) injection  6 mg Intravenous Q12H  . donepezil  10 mg Oral Daily  . insulin aspart  0-9 Units Subcutaneous TID WC  . mouth rinse  15 mL Mouth Rinse BID  . pregabalin  75 mg Oral QODAY  . sodium chloride flush  3 mL Intravenous Q12H  . umeclidinium-vilanterol  1 puff Inhalation Daily  . vitamin C  500 mg Oral Daily  . Warfarin - Pharmacist Dosing Inpatient   Does not apply q1800  . zinc sulfate  220 mg Oral Daily   Continuous Infusions: . sodium chloride    . remdesivir 100 mg in NS 100 mL 100 mg (10/28/19 0810)   PRN Meds:.sodium chloride, acetaminophen, ALPRAZolam, hydrocortisone, Ipratropium-Albuterol, ondansetron **OR** ondansetron (ZOFRAN) IV, sodium chloride flush, traMADol   Time Spent in minutes  25  See all Orders from today for further details   Jeoffrey MassedShanker Meli Faley M.D on 10/28/2019 at 11:41 AM  To page go to [email or web address] - use universal password  [facility] -  Office  [phone]    Objective:   Vitals:   10/27/19 2106 10/28/19 0600 10/28/19 0806 10/28/19 1022  BP: (!) 149/59 (!) 165/94 (!) 160/60   Pulse: 74  63   Resp: 20  19   Temp: 98.5 F (36.9 C) 97.9 F (36.6 C)  (!) 97.3 F (36.3 C)  TempSrc: Oral Oral  Oral  SpO2:   98%     Wt Readings from Last 3 Encounters:  09/11/19 54 kg  07/17/19 51.7 kg  04/05/19 49.4 kg     Intake/Output Summary (Last 24 hours) at 10/28/2019 1141 Last data filed at 10/27/2019 2244 Gross per 24 hour  Intake 133 ml  Output 900 ml  Net -767 ml     Physical Exam Gen Exam:Alert awake-not in any distress HEENT:atraumatic, normocephalic Chest: B/L clear to auscultation anteriorly CVS:S1S2 regular Abdomen:soft non tender, non distended Extremities:no edema Neurology: Non focal Skin: no rash   Data Review:    CBC Recent Labs  Lab 10/25/19 1907  10/25/19 1919 10/27/19 0037 10/28/19 0505  WBC 12.3*  --  9.5 8.3  HGB 11.5* 12.6 11.1* 11.2*  HCT 38.7 37.0 34.4* 34.2*  PLT 205  --  142* 158  MCV 106.3*  --  97.5 97.2  MCH 31.6  --  31.4 31.8  MCHC 29.7*  --  32.3 32.7  RDW 12.7  --  12.8 13.0  LYMPHSABS 3.9  --  0.9 1.0  MONOABS 0.5  --  0.1 0.3  EOSABS 0.0  --  0.0 0.0  BASOSABS 0.0  --  0.0 0.0    Chemistries  Recent Labs  Lab 10/25/19 1907 10/25/19 1919 10/27/19 0037 10/28/19 0505  NA 141 142 144 142  K 3.6 3.6 3.6 3.7  CL 105 105 104 104  CO2 25  --  24 26  GLUCOSE 238* 226* 100* 142*  BUN 32* 30* 47* 69*  CREATININE 1.45* 1.40* 1.47* 1.60*  CALCIUM 8.2*  --  8.4* 8.5*  AST 41  --  76* 47*  ALT 34  --  57* 41  ALKPHOS 73  --  60 58  BILITOT 0.5  --  1.0 0.8   ------------------------------------------------------------------------------------------------------------------ Recent Labs    10/25/19 1907  TRIG 136    Lab Results  Component Value Date   HGBA1C 5.7 (H) 10/27/2019   ------------------------------------------------------------------------------------------------------------------ No results for input(s): TSH, T4TOTAL, T3FREE, THYROIDAB in the last 72 hours.  Invalid input(s): FREET3 ------------------------------------------------------------------------------------------------------------------ Recent Labs    10/27/19 0037 10/28/19 0505  FERRITIN 4,132* 1,999*    Coagulation profile Recent Labs  Lab 10/25/19 1907 10/26/19 0525 10/27/19 0037 10/28/19 0505  INR 3.8* 4.5* 3.5* 3.5*    Recent Labs    10/27/19 0037 10/28/19 0505  DDIMER 3.00* 1.48*    Cardiac Enzymes No results for input(s): CKMB, TROPONINI, MYOGLOBIN in the last 168 hours.  Invalid input(s): CK ------------------------------------------------------------------------------------------------------------------    Component Value Date/Time   BNP 1,998.0 (H) 10/25/2019 1907    Micro Results Recent  Results (from the past 240 hour(s))  Novel Coronavirus, NAA (Hosp order, Send-out to Ref Lab; TAT 18-24 hrs     Status: Abnormal   Collection Time: 10/20/19  1:31 PM   Specimen: Nasopharyngeal Swab; Respiratory  Result Value Ref Range Status   SARS-CoV-2, NAA DETECTED (A) NOT DETECTED Final    Comment: (NOTE)                  Client Requested Flag This nucleic acid amplification test was developed and its performance characteristics determined by [employer]. Nucleic acid amplification tests include PCR and TMA. This test has not been FDA cleared or approved. This test has been authorized by FDA under an Emergency Use Authorization (EUA). This test is only authorized for the duration of time the declaration that circumstances exist justifying the authorization of the emergency use of in vitro diagnostic tests for detection of SARS-CoV-2 virus and/or diagnosis of COVID-19 infection under section 564(b)(1) of the Act, 21 U.S.C. 161WRU-0(A) (1), unless the authorization is terminated or revoked sooner. When diagnostic testing is negative, the possibility of a false negative result should be considered in the context of a patient's recent exposures and the presence of clinical signs and symptoms consistent with COVID-19. An individual without symptoms of COVID-  19 and who is not shedding SARS-CoV-2 virus would expect to have a negative (not detected) result in this assay. Performed At: [facility] [address] [place], [place] [id number] Jolene Schimke MD YN:[id number]    Coronavirus Source NASOPHARYNGEAL  Final    Comment: Performed at [facility], [address], [place], [place] [address]  Urine culture     Status: None   Collection Time: 10/25/19  8:52 PM   Specimen: Urine, Catheterized  Result Value Ref Range Status   Specimen Description   Final    URINE, CATHETERIZED Performed at [facility], [address], [place], [place] [address]    Special  Requests   Final    Normal Performed at [facility], [address], [place], [place] [address]    Culture   Final    NO GROWTH Performed at [facility], [address] [place], [place] [address]    Report Status 10/27/2019 FINAL  Final  Blood Culture (routine x 2)     Status: None (Preliminary result)   Collection Time: 10/25/19  9:05 PM   Specimen: BLOOD LEFT HAND  Result Value Ref Range Status   Specimen Description BLOOD LEFT HAND  Final   Special Requests   Final    BOTTLES DRAWN AEROBIC AND ANAEROBIC  Blood Culture adequate volume   Culture   Final    NO GROWTH 2 DAYS Performed at [facility], [address] [place], [place] [address]    Report Status PENDING  Incomplete  Blood Culture (routine x 2)     Status: None (Preliminary result)   Collection Time: 10/25/19  9:16 PM   Specimen: BLOOD RIGHT ARM  Result Value Ref Range Status   Specimen Description BLOOD RIGHT ARM  Final   Special Requests   Final    BOTTLES DRAWN AEROBIC AND ANAEROBIC Blood Culture adequate volume   Culture   Final    NO GROWTH 2 DAYS Performed at [facility], [address], [place], [place] [address]    Report Status PENDING  Incomplete    Radiology Reports DG Chest 2 View  Result Date: 10/20/2019 CLINICAL DATA:  Fatigue, cough and congestion since yesterday, former smoker, COPD, CHF, hypertension, dilated cardiomyopathy, prior stenting EXAM: CHEST - 2 VIEW COMPARISON:  07/16/2018 FINDINGS: Enlargement of cardiac silhouette post CABG and AVR. Mediastinal contours and pulmonary vascularity normal. Atherosclerotic calcification aorta. Emphysematous and bronchitic changes consistent with COPD. Minimal basilar atelectasis and small bibasilar pleural effusions, greater on RIGHT. No acute infiltrate or pneumothorax. Bones demineralized with chronic appearing height losses of adjacent midthoracic vertebra. IMPRESSION: Enlargement of cardiac silhouette post CABG and AVR. COPD changes with small  bibasilar pleural effusions and minimal atelectasis, greater on RIGHT. Aortic Atherosclerosis (ICD10-I70.0) and Emphysema (ICD10-J43.9). Electronically Signed   By: Lavonia Dana M.D.   On: 10/20/2019 13:47   CT Head Wo Contrast  Result Date: 10/25/2019 CLINICAL DATA:  Altered mental status. Coronavirus infection. EXAM: CT HEAD WITHOUT CONTRAST TECHNIQUE: Contiguous axial images were obtained from the base of the skull through the vertex without intravenous contrast. COMPARISON:  12/27/2008 FINDINGS: Brain: Age related atrophy. Chronic small-vessel ischemic changes of the white matter. No sign of acute infarction, mass lesion, hemorrhage, hydrocephalus or extra-axial collection. Vascular: There is atherosclerotic calcification of the major vessels at the base of the brain. Skull: Negative Sinuses/Orbits: Clear/normal Other: None IMPRESSION: No acute finding by CT. Age related atrophy and chronic small-vessel ischemic changes of the white matter. Electronically Signed   By: Nelson Chimes M.D.   On: 10/25/2019 20:16   CT Chest Wo Contrast  Result Date: 10/25/2019 CLINICAL DATA:  Altered mental status. Coronavirus infection. EXAM: CT CHEST WITHOUT CONTRAST TECHNIQUE: Multidetector CT imaging of the chest was performed following the standard protocol without IV contrast. COMPARISON:  Chest radiography same day. FINDINGS: Cardiovascular: Mild cardiomegaly. Previous median sternotomy. Aortic atherosclerosis. Coronary artery calcification. Mediastinum/Nodes: No hilar mass or adenopathy suspected on this noncontrast study. Lungs/Pleura: Background emphysema. Widespread bilateral patchy pulmonary infiltrates, more extensive in the right lung than the left, affecting all lobes, consistent with the clinical diagnosis of viral pneumonia. No pleural effusion. Upper Abdomen: Normal Musculoskeletal: Chronic degenerative changes of the spine. IMPRESSION: 1. Widespread bilateral patchy pulmonary infiltrates, more extensive in  the right lung than the left, consistent with the clinical diagnosis of viral pneumonia. 2. Emphysema and aortic atherosclerosis. Aortic Atherosclerosis (ICD10-I70.0) and Emphysema (ICD10-J43.9). Electronically Signed   By: Nelson Chimes M.D.   On: 10/25/2019 20:20   DG Chest Port 1 View  Result Date: 10/25/2019 CLINICAL DATA:  Shortness of breath and COVID-19 positivity EXAM: PORTABLE CHEST 1 VIEW COMPARISON:  10/20/2011 FINDINGS: Cardiac shadow is enlarged in size and stable. Aortic calcifications are noted. New diffuse bilateral infiltrates are seen right greater than left significantly increased when compared  with the prior exam. These changes are consistent with the given clinical history of COVID-19 positivity. Postsurgical changes are noted. No acute bony abnormality is seen. IMPRESSION: New diffuse bilateral infiltrative changes right greater than left consistent with the given history. Electronically Signed   By: Alcide Clever M.D.   On: 10/25/2019 19:53

## 2019-10-28 NOTE — Progress Notes (Signed)
ANTICOAGULATION CONSULT NOTE - Pharmacy Consult for Coumadin  Indication: s/p St Jude AVR   Vital Signs: Temp: 97.3 F (36.3 C) (12/13 1022) Temp Source: Oral (12/13 1022) BP: 165/94 (12/13 0600)  Labs: Recent Labs    10/25/19 1907 10/25/19 1907 10/25/19 1919 10/25/19 2116 10/26/19 0525 10/26/19 0817 10/27/19 0037 10/28/19 0505  HGB 11.5*   < > 12.6  --   --   --  11.1* 11.2*  HCT 38.7  --  37.0  --   --   --  34.4* 34.2*  PLT 205  --   --   --   --   --  142* 158  LABPROT 37.8*  --   --   --  42.6*  --  35.3* 35.1*  INR 3.8*  --   --   --  4.5*  --  3.5* 3.5*  CREATININE 1.45*  --  1.40*  --   --   --  1.47* 1.60*  TROPONINIHS 105*  --   --  547* 1,861* 1,670*  --   --    < > = values in this interval not displayed.     Medical History: Past Medical History:  Diagnosis Date  . Carotid artery stenosis    Without infarction  . Chronic combined systolic (congestive) and diastolic (congestive) heart failure ([place])   . COPD (chronic obstructive pulmonary disease) ([place])   . Coronary artery disease   . Heart valve replaced by other means   . Hypercholesterolemia    Pure  . Hypertension    Unspecified  . LBBB (left bundle branch block)   . Macular degeneration (senile) of retina, unspecified   . On home O2   . Postsurgical aortocoronary bypass status   . Stroke ([place])   . Transient global amnesia   . Unspecified hereditary and idiopathic peripheral neuropathy   . Unspecified vitamin D deficiency      Assessment: 85 y.o. F presents with COVID-19. Pt on coumadin PTA for St Jude AVR. Admission INR supratherapeutic and has remained elevated despite holding warfarin x3 days. Pt given reduced dose (0.5mg ) yesterday and INR remains elevated and unchanged at 3.5. H/H stable.   Home dose: 2.5mg  daily  Goal of Therapy:  INR 2-3 Monitor platelets by anticoagulation protocol: Yes   Plan:  -Hold warfarin tonight in attempt to let it fall further -Daily  protime   Arrie Senate, PharmD, BCPS Clinical Pharmacist Please check AMION for all [place] numbers 10/28/2019

## 2019-10-28 NOTE — Progress Notes (Signed)
   10/28/19 1500  Family/Significant Other Communication  Family/Significant Other Update Called;Updated [name])

## 2019-10-28 NOTE — Evaluation (Signed)
Occupational Therapy Evaluation Patient Details Name: [name] MRN: [id number] DOB: [birth year] Today's Date: 10/28/2019    History of Present Illness 85 y.o. female with PMHx of chronic systolic heart failure, CAD s/p CABG, mechanical aortic valve on anticoagulation, COPD on 3 L of oxygen at home-presented to ED 10/25/19 with acute hypoxic respiratory failure in the setting of COVID-19 pneumonia and decompensated systolic heart failure. Pt was diagnosed with COVID 10/20/19 and remained at home. 10/25/19 underwent bamlanivimab infusion at outpt infusion clinic and discharged to home.   Clinical Impression   Pt from home with 24 hour caregiver. PLOF is that she "furniture walks" and has min A to supervision for ADL. Her caregiver does the IADL. Pt is lively and excited to work with therapy today. Very motivated and pleasant to talk to, able to complete seated and standing grooming with balance on sink. Then Pt was able to walk 1/2 lap using the therapist arm for balance and guidance . She does not care for RW she says that they just "get in the way" but does admit to reaching out for environmental support in her home. Pt on 2L O2 (which is 1L less than normal at home) and SpO2 remained >92% throughout session. OT will follow acutely and next session to focus on energy conservation, establish HEP. Pt very motivated and anticipate she will progress to baseline for ADL and not need post-acute OT.     Follow Up Recommendations  Supervision/Assistance - 24 hour    Equipment Recommendations  None recommended by OT(Pt has appropriate DME)    Recommendations for Other Services       Precautions / Restrictions Precautions Precautions: Fall Restrictions Weight Bearing Restrictions: No      Mobility Bed Mobility               General bed mobility comments: OOB in recliner at beginning and end of session  Transfers Overall transfer level: Needs assistance Equipment used:  None Transfers: Sit to/from Stand Sit to Stand: Min guard;Min assist         General transfer comment: good hand placement, once upright immediately seeking external assist for balance    Balance Overall balance assessment: Mild deficits observed, not formally tested                                         ADL either performed or assessed with clinical judgement   ADL Overall ADL's : Needs assistance/impaired Eating/Feeding: Modified independent   Grooming: Wash/dry hands;Wash/dry face;Brushing hair;Min guard;Standing Grooming Details (indicate cue type and reason): assist for line management, uses sink for balance Upper Body Bathing: Minimal assistance;Sitting   Lower Body Bathing: Moderate assistance;Sitting/lateral leans   Upper Body Dressing : Supervision/safety;Sitting   Lower Body Dressing: Minimal assistance   Toilet Transfer: Minimal assistance;Ambulation(HHA) Toilet Transfer Details (indicate cue type and reason): HHA - did not want to use RW "I do not like those things" Toileting- Clothing Manipulation and Hygiene: Minimal assistance;Sit to/from stand       Functional mobility during ADLs: Minimal assistance(HHA)       Vision Patient Visual Report: No change from baseline       Perception     Praxis      Pertinent Vitals/Pain Pain Assessment: No/denies pain     Hand Dominance Right   Extremity/Trunk Assessment Upper Extremity Assessment Upper Extremity Assessment: Overall WFL for tasks assessed  Lower Extremity Assessment Lower Extremity Assessment: Defer to PT evaluation       Communication Communication Communication: No difficulties   Cognition Arousal/Alertness: Awake/alert Behavior During Therapy: WFL for tasks assessed/performed Overall Cognitive Status: Within Functional Limits for tasks assessed                                 General Comments: Pt completing word search on arrival   General  Comments  on 2L O2 throughout session and maintained SpO2 at 90% and above    Exercises     Shoulder Instructions      Home Living Family/patient expects to be discharged to:: Private residence Living Arrangements: Non-relatives/Friends(caregiver) Available Help at Discharge: Family;Personal care attendant;Available 24 hours/day Type of Home: House       Home Layout: One level     Bathroom Shower/Tub: Occupational psychologist: Handicapped height     Home Equipment: Shower seat - built in          Prior Functioning/Environment Level of Independence: Needs assistance  Gait / Transfers Assistance Needed: supervision 24/7 by caregiver (and on her days off, pt's daughter stays with her); no device ADL's / Homemaking Assistance Needed: Caregiver assists at min A/supervision level    Comments: enjoys "exercising"        OT Problem List: Impaired balance (sitting and/or standing);Decreased activity tolerance;Decreased safety awareness;Cardiopulmonary status limiting activity      OT Treatment/Interventions: Self-care/ADL training;Therapeutic exercise;DME and/or AE instruction;Therapeutic activities;Patient/family education;Balance training    OT Goals(Current goals can be found in the care plan section) Acute Rehab OT Goals Patient Stated Goal: return home with caregiver, family assist OT Goal Formulation: With patient Time For Goal Achievement: 11/11/19 Potential to Achieve Goals: Good ADL Goals Pt Will Perform Grooming: with supervision;standing Pt Will Transfer to Toilet: with supervision;ambulating Pt Will Perform Toileting - Clothing Manipulation and hygiene: with supervision;sit to/from stand Pt/caregiver will Perform Home Exercise Program: Both right and left upper extremity;With theraband;With written HEP provided Additional ADL Goal #1: Pt will recall 3 ways of conserving energy throughout ADL routine at supervision level  OT Frequency: Min 2X/week    Barriers to D/C:            Co-evaluation              AM-PAC OT "6 Clicks" Daily Activity     Outcome Measure Help from another person eating meals?: None Help from another person taking care of personal grooming?: A Little Help from another person toileting, which includes using toliet, bedpan, or urinal?: A Little Help from another person bathing (including washing, rinsing, drying)?: A Little Help from another person to put on and taking off regular upper body clothing?: A Little Help from another person to put on and taking off regular lower body clothing?: A Lot 6 Click Score: 18   End of Session Equipment Utilized During Treatment: Gait belt;Oxygen(2L) Nurse Communication: Mobility status  Activity Tolerance: Patient tolerated treatment well Patient left: in chair;with call bell/phone within reach  OT Visit Diagnosis: Other abnormalities of gait and mobility (R26.89);Muscle weakness (generalized) (M62.81)                Time: 4854-6270 OT Time Calculation (min): 44 min Charges:  OT General Charges $OT Visit: 1 Visit OT Evaluation $OT Eval Moderate Complexity: 1 Mod OT Treatments $Self Care/Home Management : 8-22 mins $Therapeutic Activity: 8-22 mins  Hulda Humphrey OTR/L  Acute Rehabilitation Services Pager: [phone] Office: [phone]  Evern Bio Charle Mclaurin 10/28/2019, 5:32 PM

## 2019-10-29 LAB — COMPREHENSIVE METABOLIC PANEL
ALT: 33 U/L (ref 0–44)
AST: 47 U/L — ABNORMAL HIGH (ref 15–41)
Albumin: 3.1 g/dL — ABNORMAL LOW (ref 3.5–5.0)
Alkaline Phosphatase: 58 U/L (ref 38–126)
Anion gap: 14 (ref 5–15)
BUN: 67 mg/dL — ABNORMAL HIGH (ref 8–23)
CO2: 23 mmol/L (ref 22–32)
Calcium: 8.4 mg/dL — ABNORMAL LOW (ref 8.9–10.3)
Chloride: 103 mmol/L (ref 98–111)
Creatinine, Ser: 1.27 mg/dL — ABNORMAL HIGH (ref 0.44–1.00)
GFR calc Af Amer: 43 mL/min — ABNORMAL LOW (ref >60)
GFR calc non Af Amer: 37 mL/min — ABNORMAL LOW (ref >60)
Glucose, Bld: 123 mg/dL — ABNORMAL HIGH (ref 70–99)
Potassium: 4.7 mmol/L (ref 3.5–5.1)
Sodium: 140 mmol/L (ref 135–145)
Total Bilirubin: 1.1 mg/dL (ref 0.3–1.2)
Total Protein: 5.6 g/dL — ABNORMAL LOW (ref 6.5–8.1)

## 2019-10-29 LAB — CBC WITH DIFFERENTIAL/PLATELET
Abs Immature Granulocytes: 0.03 10*3/uL (ref 0.00–0.07)
Basophils Absolute: 0 10*3/uL (ref 0.0–0.1)
Basophils Relative: 0 %
Eosinophils Absolute: 0 10*3/uL (ref 0.0–0.5)
Eosinophils Relative: 0 %
HCT: 32.8 % — ABNORMAL LOW (ref 36.0–46.0)
Hemoglobin: 10.6 g/dL — ABNORMAL LOW (ref 12.0–15.0)
Immature Granulocytes: 0 %
Lymphocytes Relative: 10 %
Lymphs Abs: 0.8 10*3/uL (ref 0.7–4.0)
MCH: 31.7 pg (ref 26.0–34.0)
MCHC: 32.3 g/dL (ref 30.0–36.0)
MCV: 98.2 fL (ref 80.0–100.0)
Monocytes Absolute: 0.2 10*3/uL (ref 0.1–1.0)
Monocytes Relative: 3 %
Neutro Abs: 6.9 10*3/uL (ref 1.7–7.7)
Neutrophils Relative %: 87 %
Platelets: 156 10*3/uL (ref 150–400)
RBC: 3.34 MIL/uL — ABNORMAL LOW (ref 3.87–5.11)
RDW: 13.1 % (ref 11.5–15.5)
WBC: 8 10*3/uL (ref 4.0–10.5)
nRBC: 0 % (ref 0.0–0.2)

## 2019-10-29 LAB — PROTIME-INR
INR: 4.2 (ref 0.8–1.2)
Prothrombin Time: 40.9 seconds — ABNORMAL HIGH (ref 11.4–15.2)

## 2019-10-29 LAB — GLUCOSE, CAPILLARY
Glucose-Capillary: 133 mg/dL — ABNORMAL HIGH (ref 70–99)
Glucose-Capillary: 134 mg/dL — ABNORMAL HIGH (ref 70–99)
Glucose-Capillary: 176 mg/dL — ABNORMAL HIGH (ref 70–99)

## 2019-10-29 LAB — D-DIMER, QUANTITATIVE: D-Dimer, Quant: 0.75 ug/mL-FEU — ABNORMAL HIGH (ref 0.00–0.50)

## 2019-10-29 LAB — FERRITIN: Ferritin: 1131 ng/mL — ABNORMAL HIGH (ref 11–307)

## 2019-10-29 LAB — C-REACTIVE PROTEIN: CRP: 5.5 mg/dL — ABNORMAL HIGH (ref <1.0)

## 2019-10-29 MED ORDER — FUROSEMIDE 20 MG PO TABS
40.0000 mg | ORAL_TABLET | Freq: Every day | ORAL | Status: DC
  Administered 2019-10-29 – 2019-10-30 (×2): 40 mg via ORAL
  Filled 2019-10-29 (×2): qty 2

## 2019-10-29 MED ORDER — SPIRONOLACTONE 12.5 MG HALF TABLET
12.5000 mg | ORAL_TABLET | Freq: Every day | ORAL | Status: DC
  Administered 2019-10-29 – 2019-10-30 (×2): 12.5 mg via ORAL
  Filled 2019-10-29 (×2): qty 1

## 2019-10-29 NOTE — Progress Notes (Signed)
PROGRESS NOTE                                                                                                                                                                                                             Patient Demographics:    [name], is a 85 y.o. female, DOB - 09-11-1926, UXL:[id number]  Outpatient Primary MD for the patient is Colleen Du, MD   Admit date - 10/25/2019   LOS - 4  Chief Complaint  Patient presents with  . Shortness of Breath       Brief Narrative: Patient is a 85 y.o. female with PMHx of chronic systolic heart failure, CAD s/p CABG, mechanical aortic valve on anticoagulation, COPD on 3 L of oxygen at home-presented with shortness of breath-found to have acute hypoxic respiratory failure in the setting of COVID-19 pneumonia and decompensated systolic heart failure   Subjective:    [name] feels much better-no nausea, vomiting or diarrhea.   Assessment  & Plan :   Acute on chronic hypoxic Resp Failure due to Covid 19 Viral pneumonia and decompensated systolic heart failure (EF 25-30% March 2020): Stable-on just 2 L of oxygen.  Continue steroids and remdesivir, inflammatory markers are slowly downtrending.  Volume status is stable-creatinine is back down-suspect we can resume diuretics.  Will complete 5-day course of remdesivir on 12/15.  Fever: afebrile  O2 requirements:  SpO2: 98 % O2 Flow Rate (L/min): 2 L/min   COVID-19 Labs: Recent Labs    10/27/19 0037 10/28/19 0505 10/29/19 0326  DDIMER 3.00* 1.48* 0.75*  FERRITIN 4,132* 1,999* 1,131*  CRP 13.0* 8.4* 5.5*       Component Value Date/Time   BNP 1,998.0 (H) 10/25/2019 1907    Recent Labs  Lab 10/25/19 1907  PROCALCITON <0.10    Lab Results  Component Value Date   SARSCOV2NAA DETECTED (A) 10/20/2019     COVID-19 Medications: Steroids: 12/11>> Remdesivir: 12/10>>  Other  medications: Diuretics:Euvolemic, negative balance of 2.3 L-resume Lasix 12/14.  Antibiotics:Not needed as no evidence of bacterial infection Insulin: CBG stable with SSI.  A1c 5.7 on 12/12.  Prone/Incentive Spirometry: encouraged  incentive spirometry use 3-4/hour.  DVT Prophylaxis  : Coumadin  Sepsis: Secondary to COVID-19-sepsis physiology has resolved.  Nausea/Vomiting: Probably due to COVID-19-continue supportive care-as needed antiemetics.  Elevated troponin: Secondary demand ischemia-prior hospitalist  Dr. Marinus Maw with cardiology-recommendations are for conservative management.  Transaminitis: Appears mild-secondary to COVID-19-continue to follow  AKI on CKD stage IIIa: AKI improved-we will resume diuretics today.  COPD-on 3 L of oxygen at home: No signs of exacerbation-continue bronchodilators  CAD s/p CABG: No anginal symptoms-see above regarding troponins  History of aortic valve replacement-mechanical valve: On Coumadin-INR supratherapeutic-pharmacy following.INR goal 2.5-3.5  Dementia: Appears to be mild-continue Aricept  RN pressure injury documentation: Pressure Injury 07/23/18 Stage I -  Intact skin with non-blanchable redness of a localized area usually over a bony prominence. (Active)  07/23/18 1821  Location: Ischial tuberosity  Location Orientation: Medial  Staging: Stage I -  Intact skin with non-blanchable redness of a localized area usually over a bony prominence.  Wound Description (Comments):   Present on Admission: Yes     Pressure Injury 10/26/19 Buttocks Medial Stage I -  Intact skin with non-blanchable redness of a localized area usually over a bony prominence. (Active)  10/26/19 1315  Location: Buttocks  Location Orientation: Medial  Staging: Stage I -  Intact skin with non-blanchable redness of a localized area usually over a bony prominence.  Wound Description (Comments):   Present on Admission: Yes   Consults  :  None  Procedures   :  None  ABG:    Component Value Date/Time   PHART 7.174 (LL) 10/25/2019 2007   PCO2ART 74.4 (HH) 10/25/2019 2007   PO2ART 85.3 10/25/2019 2007   HCO3 21.4 10/25/2019 2007   TCO2 26 10/25/2019 1919   ACIDBASEDEF 1.3 10/25/2019 2007   O2SAT 93.0 10/25/2019 2007    Vent Settings: N/A   Condition -Stable  Family Communication  : Granddaughter over the phone on 12/14  Code Status :  DNR  Diet :  Diet Order            Diet Heart Room service appropriate? Yes; Fluid consistency: Thin  Diet effective now               Disposition Plan  :  Remain hospitalized  Barriers to discharge: Hypoxia requiring O2 supplementation/complete 5 days of IV Remdesivir  Antimicorbials  :    Anti-infectives (From admission, onward)   Start     Dose/Rate Route Frequency Ordered Stop   10/27/19 1000  remdesivir 100 mg in sodium chloride 0.9 % 100 mL IVPB  Status:  Discontinued     100 mg 200 mL/hr over 30 Minutes Intravenous Daily 10/26/19 0601 10/26/19 0627   10/27/19 1000  remdesivir 100 mg in sodium chloride 0.9 % 100 mL IVPB  Status:  Discontinued     100 mg 200 mL/hr over 30 Minutes Intravenous Daily 10/26/19 0214 10/26/19 0629   10/27/19 1000  remdesivir 100 mg in sodium chloride 0.9 % 100 mL IVPB     100 mg 200 mL/hr over 30 Minutes Intravenous Daily 10/26/19 0628 10/31/19 0959   10/26/19 1000  remdesivir 100 mg in sodium chloride 0.9 % 100 mL IVPB  Status:  Discontinued     100 mg 200 mL/hr over 30 Minutes Intravenous Daily 10/25/19 2234 10/26/19 0214   10/26/19 0615  remdesivir 200 mg in sodium chloride 0.9% 250 mL IVPB  Status:  Discontinued     200 mg 580 mL/hr over 30 Minutes Intravenous Once 10/26/19 0601 10/26/19 0627   10/25/19 2330  remdesivir 200 mg in sodium chloride 0.9% 250 mL IVPB     200 mg 580 mL/hr over 30 Minutes Intravenous Once 10/25/19 2234 10/26/19 0602  Inpatient Medications  Scheduled Meds: . atorvastatin  40 mg Oral Daily  . carvedilol  3.125 mg  Oral BID WC  . Chlorhexidine Gluconate Cloth  6 each Topical Daily  . dexamethasone (DECADRON) injection  6 mg Intravenous Q12H  . donepezil  10 mg Oral Daily  . insulin aspart  0-9 Units Subcutaneous TID WC  . mouth rinse  15 mL Mouth Rinse BID  . pregabalin  75 mg Oral QODAY  . sodium chloride flush  3 mL Intravenous Q12H  . umeclidinium-vilanterol  1 puff Inhalation Daily  . vitamin C  500 mg Oral Daily  . Warfarin - Pharmacist Dosing Inpatient   Does not apply q1800  . zinc sulfate  220 mg Oral Daily   Continuous Infusions: . sodium chloride    . remdesivir 100 mg in NS 100 mL 100 mg (10/29/19 1135)   PRN Meds:.sodium chloride, acetaminophen, ALPRAZolam, hydrocortisone, Ipratropium-Albuterol, ondansetron **OR** ondansetron (ZOFRAN) IV, sodium chloride flush, traMADol   Time Spent in minutes  25  See all Orders from today for further details   Jeoffrey MassedShanker Camilla Skeen M.D on 10/29/2019 at 1:01 PM  To page go to [email or web address] - use universal password  [facility] -  Office  [phone]    Objective:   Vitals:   10/28/19 2008 10/29/19 0649 10/29/19 0819 10/29/19 1137  BP: (!) 135/57 (!) 153/72 (!) 146/73 (!) 128/51  Pulse: 69 88 62   Resp:   18 16  Temp: 97.8 F (36.6 C) 97.8 F (36.6 C) 98.1 F (36.7 C) 97.7 F (36.5 C)  TempSrc: Oral Oral Oral Oral  SpO2: 97% (!) 89% 98%     Wt Readings from Last 3 Encounters:  09/11/19 54 kg  07/17/19 51.7 kg  04/05/19 49.4 kg     Intake/Output Summary (Last 24 hours) at 10/29/2019 1301 Last data filed at 10/29/2019 0906 Gross per 24 hour  Intake 253 ml  Output --  Net 253 ml     Physical Exam Gen Exam:Alert awake-not in any distress HEENT:atraumatic, normocephalic Chest: B/L clear to auscultation anteriorly CVS:S1S2 regular Abdomen:soft non tender, non distended Extremities:no edema Neurology: Non focal Skin: no rash   Data Review:    CBC Recent Labs  Lab 10/25/19 1907 10/25/19 1919 10/27/19 0037  10/28/19 0505 10/29/19 0326  WBC 12.3*  --  9.5 8.3 8.0  HGB 11.5* 12.6 11.1* 11.2* 10.6*  HCT 38.7 37.0 34.4* 34.2* 32.8*  PLT 205  --  142* 158 156  MCV 106.3*  --  97.5 97.2 98.2  MCH 31.6  --  31.4 31.8 31.7  MCHC 29.7*  --  32.3 32.7 32.3  RDW 12.7  --  12.8 13.0 13.1  LYMPHSABS 3.9  --  0.9 1.0 0.8  MONOABS 0.5  --  0.1 0.3 0.2  EOSABS 0.0  --  0.0 0.0 0.0  BASOSABS 0.0  --  0.0 0.0 0.0    Chemistries  Recent Labs  Lab 10/25/19 1907 10/25/19 1919 10/27/19 0037 10/28/19 0505 10/29/19 0326  NA 141 142 144 142 140  K 3.6 3.6 3.6 3.7 4.7  CL 105 105 104 104 103  CO2 25  --  24 26 23   GLUCOSE 238* 226* 100* 142* 123*  BUN 32* 30* 47* 69* 67*  CREATININE 1.45* 1.40* 1.47* 1.60* 1.27*  CALCIUM 8.2*  --  8.4* 8.5* 8.4*  AST 41  --  76* 47* 47*  ALT 34  --  57* 41 33  ALKPHOS 73  --  60 58 58  BILITOT 0.5  --  1.0 0.8 1.1   ------------------------------------------------------------------------------------------------------------------ No results for input(s): CHOL, HDL, LDLCALC, TRIG, CHOLHDL, LDLDIRECT in the last 72 hours.  Lab Results  Component Value Date   HGBA1C 5.7 (H) 10/27/2019   ------------------------------------------------------------------------------------------------------------------ No results for input(s): TSH, T4TOTAL, T3FREE, THYROIDAB in the last 72 hours.  Invalid input(s): FREET3 ------------------------------------------------------------------------------------------------------------------ Recent Labs    10/28/19 0505 10/29/19 0326  FERRITIN 1,999* 1,131*    Coagulation profile Recent Labs  Lab 10/25/19 1907 10/26/19 0525 10/27/19 0037 10/28/19 0505 10/29/19 0326  INR 3.8* 4.5* 3.5* 3.5* 4.2*    Recent Labs    10/28/19 0505 10/29/19 0326  DDIMER 1.48* 0.75*    Cardiac Enzymes No results for input(s): CKMB, TROPONINI, MYOGLOBIN in the last 168 hours.  Invalid input(s):  CK ------------------------------------------------------------------------------------------------------------------    Component Value Date/Time   BNP 1,998.0 (H) 10/25/2019 1907    Micro Results Recent Results (from the past 240 hour(s))  Novel Coronavirus, NAA (Hosp order, Send-out to Ref Lab; TAT 18-24 hrs     Status: Abnormal   Collection Time: 10/20/19  1:31 PM   Specimen: Nasopharyngeal Swab; Respiratory  Result Value Ref Range Status   SARS-CoV-2, NAA DETECTED (A) NOT DETECTED Final    Comment: (NOTE)                  Client Requested Flag This nucleic acid amplification test was developed and its performance characteristics determined by [employer]. Nucleic acid amplification tests include PCR and TMA. This test has not been FDA cleared or approved. This test has been authorized by FDA under an Emergency Use Authorization (EUA). This test is only authorized for the duration of time the declaration that circumstances exist justifying the authorization of the emergency use of in vitro diagnostic tests for detection of SARS-CoV-2 virus and/or diagnosis of COVID-19 infection under section 564(b)(1) of the Act, 21 U.S.C. 119JYN-8(G) (1), unless the authorization is terminated or revoked sooner. When diagnostic testing is negative, the possibility of a false negative result should be considered in the context of a patient's recent exposures and the presence of clinical signs and symptoms consistent with COVID-19. An individual without symptoms of COVID-  19 and who is not shedding SARS-CoV-2 virus would expect to have a negative (not detected) result in this assay. Performed At: [facility] [address] [place], [place] [id number] Jolene Schimke MD VH:[id number]    Coronavirus Source NASOPHARYNGEAL  Final    Comment: Performed at [facility], [address], [place], [place] [address]  Urine culture     Status: None   Collection Time: 10/25/19   8:52 PM   Specimen: Urine, Catheterized  Result Value Ref Range Status   Specimen Description   Final    URINE, CATHETERIZED Performed at [facility], [address], [place], [place] [address]    Special Requests   Final    Normal Performed at [facility], [address], [place], [place] [address]    Culture   Final    NO GROWTH Performed at [facility], [address] [place], [place] [address]    Report Status 10/27/2019 FINAL  Final  Blood Culture (routine x 2)     Status: None (Preliminary result)   Collection Time: 10/25/19  9:05 PM   Specimen: BLOOD LEFT HAND  Result Value Ref Range Status   Specimen Description BLOOD LEFT HAND  Final   Special Requests   Final  BOTTLES DRAWN AEROBIC AND ANAEROBIC Blood Culture adequate volume   Culture   Final    NO GROWTH 4 DAYS Performed at [facility], [address] [place], [place] [address]    Report Status PENDING  Incomplete  Blood Culture (routine x 2)     Status: None (Preliminary result)   Collection Time: 10/25/19  9:16 PM   Specimen: BLOOD RIGHT ARM  Result Value Ref Range Status   Specimen Description BLOOD RIGHT ARM  Final   Special Requests   Final    BOTTLES DRAWN AEROBIC AND ANAEROBIC Blood Culture adequate volume   Culture   Final    NO GROWTH 4 DAYS Performed at [facility], [address] [place], [place] [address]    Report Status PENDING  Incomplete    Radiology Reports DG Chest 2 View  Result Date: 10/20/2019 CLINICAL DATA:  Fatigue, cough and congestion since yesterday, former smoker, COPD, CHF, hypertension, dilated cardiomyopathy, prior stenting EXAM: CHEST - 2 VIEW COMPARISON:  07/16/2018 FINDINGS: Enlargement of cardiac silhouette post CABG and AVR. Mediastinal contours and pulmonary vascularity normal. Atherosclerotic calcification aorta. Emphysematous and bronchitic changes consistent with COPD. Minimal basilar atelectasis and small bibasilar pleural effusions, greater on RIGHT. No acute  infiltrate or pneumothorax. Bones demineralized with chronic appearing height losses of adjacent midthoracic vertebra. IMPRESSION: Enlargement of cardiac silhouette post CABG and AVR. COPD changes with small bibasilar pleural effusions and minimal atelectasis, greater on RIGHT. Aortic Atherosclerosis (ICD10-I70.0) and Emphysema (ICD10-J43.9). Electronically Signed   By: Ulyses Southward M.D.   On: 10/20/2019 13:47   CT Head Wo Contrast  Result Date: 10/25/2019 CLINICAL DATA:  Altered mental status. Coronavirus infection. EXAM: CT HEAD WITHOUT CONTRAST TECHNIQUE: Contiguous axial images were obtained from the base of the skull through the vertex without intravenous contrast. COMPARISON:  12/27/2008 FINDINGS: Brain: Age related atrophy. Chronic small-vessel ischemic changes of the white matter. No sign of acute infarction, mass lesion, hemorrhage, hydrocephalus or extra-axial collection. Vascular: There is atherosclerotic calcification of the major vessels at the base of the brain. Skull: Negative Sinuses/Orbits: Clear/normal Other: None IMPRESSION: No acute finding by CT. Age related atrophy and chronic small-vessel ischemic changes of the white matter. Electronically Signed   By: Paulina Fusi M.D.   On: 10/25/2019 20:16   CT Chest Wo Contrast  Result Date: 10/25/2019 CLINICAL DATA:  Altered mental status. Coronavirus infection. EXAM: CT CHEST WITHOUT CONTRAST TECHNIQUE: Multidetector CT imaging of the chest was performed following the standard protocol without IV contrast. COMPARISON:  Chest radiography same day. FINDINGS: Cardiovascular: Mild cardiomegaly. Previous median sternotomy. Aortic atherosclerosis. Coronary artery calcification. Mediastinum/Nodes: No hilar mass or adenopathy suspected on this noncontrast study. Lungs/Pleura: Background emphysema. Widespread bilateral patchy pulmonary infiltrates, more extensive in the right lung than the left, affecting all lobes, consistent with the clinical  diagnosis of viral pneumonia. No pleural effusion. Upper Abdomen: Normal Musculoskeletal: Chronic degenerative changes of the spine. IMPRESSION: 1. Widespread bilateral patchy pulmonary infiltrates, more extensive in the right lung than the left, consistent with the clinical diagnosis of viral pneumonia. 2. Emphysema and aortic atherosclerosis. Aortic Atherosclerosis (ICD10-I70.0) and Emphysema (ICD10-J43.9). Electronically Signed   By: Paulina Fusi M.D.   On: 10/25/2019 20:20   DG Chest Port 1 View  Result Date: 10/25/2019 CLINICAL DATA:  Shortness of breath and COVID-19 positivity EXAM: PORTABLE CHEST 1 VIEW COMPARISON:  10/20/2011 FINDINGS: Cardiac shadow is enlarged in size and stable. Aortic calcifications are noted. New diffuse bilateral infiltrates are seen right greater than  left significantly increased when compared with the prior exam. These changes are consistent with the given clinical history of COVID-19 positivity. Postsurgical changes are noted. No acute bony abnormality is seen. IMPRESSION: New diffuse bilateral infiltrative changes right greater than left consistent with the given history. Electronically Signed   By: Alcide Clever M.D.   On: 10/25/2019 19:53

## 2019-10-29 NOTE — Progress Notes (Signed)
CRITICAL VALUE ALERT  Critical Value:  INR 4.2  Date & Time Notied:  10/29/19 7:45am  Provider Notified: Ghimire  Orders Received/Actions taken: Pharmacy notified

## 2019-10-29 NOTE — Progress Notes (Signed)
Physical Therapy Treatment Patient Details Name: [name] MRN: [id number] DOB: 16-Oct-1926 Today's Date: 10/29/2019    History of Present Illness 85 y.o. female with PMHx of chronic systolic heart failure, CAD s/p CABG, mechanical aortic valve on anticoagulation, COPD on 3 L of oxygen at home-presented to ED 10/25/19 with acute hypoxic respiratory failure in the setting of COVID-19 pneumonia and decompensated systolic heart failure. Pt was diagnosed with COVID 10/20/19 and remained at home. 10/25/19 underwent bamlanivimab infusion at outpt infusion clinic and discharged to home.    PT Comments    Patient remains slightly unsteady, however better than last PT session. She reports she prefers to hold onto her caregivers arm vs use of assistive device (and she has 24/7 caregivers). Sats remained >=92% throughout session on 2L.     Follow Up Recommendations  Home health PT;Supervision/Assistance - 24 hour     Equipment Recommendations  None recommended by PT    Recommendations for Other Services       Precautions / Restrictions Precautions Precautions: Fall Restrictions Weight Bearing Restrictions: No    Mobility  Bed Mobility Overal bed mobility: Modified Independent             General bed mobility comments: using rail  Transfers Overall transfer level: Needs assistance Equipment used: None Transfers: Sit to/from Stand Sit to Stand: Min guard         General transfer comment: repeated trials from bed vs BSC and able to self-stabilize each time without UE support  Ambulation/Gait Ambulation/Gait assistance: Min assist Gait Distance (Feet): 140 Feet Assistive device: 1 person hand held assist Gait Pattern/deviations: Step-through pattern;Decreased stride length;Drifts right/left;Wide base of support     General Gait Details: refused use of RW or SPC--she holds onto the arm of her caregiver and states she plans to do that more when she is discharged because  she knows she is weak; instead held PTs elbow; several side steps required assist to recover (fewer than last visit and static balance much better)   Stairs             Wheelchair Mobility    Modified Rankin (Stroke Patients Only)       Balance Overall balance assessment: Mild deficits observed, not formally tested                                          Cognition Arousal/Alertness: Awake/alert Behavior During Therapy: WFL for tasks assessed/performed Overall Cognitive Status: Within Functional Limits for tasks assessed                                        Exercises      General Comments General comments (skin integrity, edema, etc.): on 2L throughout with sats >92% (normally uses 3L at home)      Pertinent Vitals/Pain Pain Assessment: No/denies pain    Home Living                      Prior Function            PT Goals (current goals can now be found in the care plan section) Acute Rehab PT Goals Patient Stated Goal: return home with caregiver, family assist PT Goal Formulation: With patient Time For Goal Achievement: 11/10/19 Progress towards PT  goals: Progressing toward goals    Frequency    Min 3X/week      PT Plan Current plan remains appropriate    Co-evaluation              AM-PAC PT "6 Clicks" Mobility   Outcome Measure  Help needed turning from your back to your side while in a flat bed without using bedrails?: None Help needed moving from lying on your back to sitting on the side of a flat bed without using bedrails?: None Help needed moving to and from a bed to a chair (including a wheelchair)?: A Little Help needed standing up from a chair using your arms (e.g., wheelchair or bedside chair)?: A Little Help needed to walk in hospital room?: A Little Help needed climbing 3-5 steps with a railing? : A Little 6 Click Score: 20    End of Session Equipment Utilized During Treatment:  Oxygen Activity Tolerance: Patient tolerated treatment well Patient left: in chair;with call bell/phone within reach   PT Visit Diagnosis: Unsteadiness on feet (R26.81);Difficulty in walking, not elsewhere classified (R26.2)     Time: 7510-2585 PT Time Calculation (min) (ACUTE ONLY): 27 min  Charges:  $Gait Training: 23-37 mins                      Barry Brunner, PT Pager [phone]    Rexanne Mano 10/29/2019, 1:32 PM

## 2019-10-29 NOTE — Progress Notes (Signed)
ANTICOAGULATION CONSULT NOTE  Pharmacy Consult for Warfarin Indication: s/p St Jude AVR   Vital Signs: Temp: 97.8 F (36.6 C) (12/14 0649) Temp Source: Oral (12/14 0649) BP: 153/72 (12/14 0649) Pulse Rate: 88 (12/14 0649)  Labs: Recent Labs    10/26/19 0817 10/27/19 0037 10/28/19 0505 10/29/19 0326  HGB  --  11.1* 11.2* 10.6*  HCT  --  34.4* 34.2* 32.8*  PLT  --  142* 158 156  LABPROT  --  35.3* 35.1* 40.9*  INR  --  3.5* 3.5* 4.2*  CREATININE  --  1.47* 1.60*  --   TROPONINIHS 1,670*  --   --   --      Medical History: Past Medical History:  Diagnosis Date  . Carotid artery stenosis    Without infarction  . Chronic combined systolic (congestive) and diastolic (congestive) heart failure ([place])   . COPD (chronic obstructive pulmonary disease) ([place])   . Coronary artery disease   . Heart valve replaced by other means   . Hypercholesterolemia    Pure  . Hypertension    Unspecified  . LBBB (left bundle branch block)   . Macular degeneration (senile) of retina, unspecified   . On home O2   . Postsurgical aortocoronary bypass status   . Stroke ([place])   . Transient global amnesia   . Unspecified hereditary and idiopathic peripheral neuropathy   . Unspecified vitamin D deficiency    Home warfarin dose: 2.5 mg daily.  LD 12/9  Assessment: 85 y.o. F presents with COVID-19. Pt on warfarin PTA for St Jude AVR. Admission INR supratherapeutic at 3.8 and has remained elevated despite holding warfarin x3 days. Pt given reduced dose (0.5mg ) on 12/12 (in anticipation of INR drop) but INR remains elevated and increased to 4.2. Hgb slightly decreased to 10.6, Plt stable ~ 150k.  D-dimer 0.75  Goal of Therapy:  INR 2-3 Monitor platelets by anticoagulation protocol: Yes   Plan:  Continue to hold warfarin. Daily PT, INR  [name] PharmD, BCPS Clinical pharmacist phone 7am- 5pm: [phone] 10/29/2019 8:02 AM

## 2019-10-29 NOTE — Progress Notes (Signed)
Family Update:  Spoke with the patient's daughter [name]

## 2019-10-30 ENCOUNTER — Other Ambulatory Visit: Payer: Self-pay | Admitting: *Deleted

## 2019-10-30 ENCOUNTER — Telehealth: Payer: Self-pay | Admitting: *Deleted

## 2019-10-30 LAB — CBC WITH DIFFERENTIAL/PLATELET
Abs Immature Granulocytes: 0.05 10*3/uL (ref 0.00–0.07)
Basophils Absolute: 0 10*3/uL (ref 0.0–0.1)
Basophils Relative: 0 %
Eosinophils Absolute: 0 10*3/uL (ref 0.0–0.5)
Eosinophils Relative: 0 %
HCT: 32 % — ABNORMAL LOW (ref 36.0–46.0)
Hemoglobin: 10.7 g/dL — ABNORMAL LOW (ref 12.0–15.0)
Immature Granulocytes: 1 %
Lymphocytes Relative: 9 %
Lymphs Abs: 0.8 10*3/uL (ref 0.7–4.0)
MCH: 31.8 pg (ref 26.0–34.0)
MCHC: 33.4 g/dL (ref 30.0–36.0)
MCV: 95 fL (ref 80.0–100.0)
Monocytes Absolute: 0.2 10*3/uL (ref 0.1–1.0)
Monocytes Relative: 2 %
Neutro Abs: 7.8 10*3/uL — ABNORMAL HIGH (ref 1.7–7.7)
Neutrophils Relative %: 88 %
Platelets: 147 10*3/uL — ABNORMAL LOW (ref 150–400)
RBC: 3.37 MIL/uL — ABNORMAL LOW (ref 3.87–5.11)
RDW: 12.7 % (ref 11.5–15.5)
WBC: 8.8 10*3/uL (ref 4.0–10.5)
nRBC: 0 % (ref 0.0–0.2)

## 2019-10-30 LAB — COMPREHENSIVE METABOLIC PANEL
ALT: 33 U/L (ref 0–44)
AST: 37 U/L (ref 15–41)
Albumin: 3.1 g/dL — ABNORMAL LOW (ref 3.5–5.0)
Alkaline Phosphatase: 54 U/L (ref 38–126)
Anion gap: 9 (ref 5–15)
BUN: 65 mg/dL — ABNORMAL HIGH (ref 8–23)
CO2: 29 mmol/L (ref 22–32)
Calcium: 8.4 mg/dL — ABNORMAL LOW (ref 8.9–10.3)
Chloride: 101 mmol/L (ref 98–111)
Creatinine, Ser: 1.42 mg/dL — ABNORMAL HIGH (ref 0.44–1.00)
GFR calc Af Amer: 38 mL/min — ABNORMAL LOW (ref >60)
GFR calc non Af Amer: 32 mL/min — ABNORMAL LOW (ref >60)
Glucose, Bld: 154 mg/dL — ABNORMAL HIGH (ref 70–99)
Potassium: 3.8 mmol/L (ref 3.5–5.1)
Sodium: 139 mmol/L (ref 135–145)
Total Bilirubin: 1.3 mg/dL — ABNORMAL HIGH (ref 0.3–1.2)
Total Protein: 5.7 g/dL — ABNORMAL LOW (ref 6.5–8.1)

## 2019-10-30 LAB — CULTURE, BLOOD (ROUTINE X 2)
Culture: NO GROWTH
Culture: NO GROWTH
Special Requests: ADEQUATE
Special Requests: ADEQUATE

## 2019-10-30 LAB — PROTIME-INR
INR: 4.1 (ref 0.8–1.2)
Prothrombin Time: 39.6 seconds — ABNORMAL HIGH (ref 11.4–15.2)

## 2019-10-30 LAB — GLUCOSE, CAPILLARY
Glucose-Capillary: 162 mg/dL — ABNORMAL HIGH (ref 70–99)
Glucose-Capillary: 146 mg/dL — ABNORMAL HIGH (ref 70–99)
Glucose-Capillary: 163 mg/dL — ABNORMAL HIGH (ref 70–99)

## 2019-10-30 LAB — C-REACTIVE PROTEIN: CRP: 3.8 mg/dL — ABNORMAL HIGH (ref <1.0)

## 2019-10-30 LAB — D-DIMER, QUANTITATIVE: D-Dimer, Quant: 0.84 ug/mL-FEU — ABNORMAL HIGH (ref 0.00–0.50)

## 2019-10-30 LAB — FERRITIN: Ferritin: 769 ng/mL — ABNORMAL HIGH (ref 11–307)

## 2019-10-30 MED ORDER — DEXAMETHASONE 6 MG PO TABS: 6.0000 mg | ORAL_TABLET | Freq: Every day | ORAL | 0 refills | Status: DC

## 2019-10-30 MED ORDER — WARFARIN SODIUM 2.5 MG PO TABS: 2.5000 mg | ORAL_TABLET | Freq: Every evening | ORAL | Status: DC

## 2019-10-30 NOTE — Telephone Encounter (Signed)
Dtr called and stated the pt is still in the hospital being treated for COVID 19 and they are planning to discharge her maybe tomorrow. She stated that the MD has recommended that the pt stay in for 2 weeks after discharge so they need to cancel the upcoming appt. She stated she would also try to get Home Health as well and she will keep [place] updated to reschedule.

## 2019-10-30 NOTE — Care Management Important Message (Signed)
Important Message  Patient Details  Name: [name] MRN: [id number] Date of Birth: 10-28-1926   Medicare Important Message Given:  Yes - Important Message mailed due to current National Emergency  Verbal consent obtained due to current National Emergency  Relationship to patient: Child Contact Name: [name] Call Date: 10/30/19  Time: 1326 Phone: [phone] Outcome: Spoke with contact Important Message mailed to: Patient address on file (temporary address on file)    Delorse Lek 10/30/2019, 1:29 PM

## 2019-10-30 NOTE — Discharge Summary (Signed)
PATIENT DETAILS Name: [name] Age: 85 y.o. Sex: female Date of Birth: 02-25-1926 MRN: [id number]. Admitting Physician: Meredeth Ide, MD WUJ:[id number], Ramon Dredge, MD  Admit Date: 10/25/2019 Discharge date: 10/30/2019  Recommendations for Outpatient Follow-up:  1. Follow up with PCP in 1-2 weeks 2. Please obtain CMP/CBC in one week 3. Repeat Chest Xray in 4-6 week 4. Repeat INR on 12/17-and adjust dosing of Coumadin accordingly  Admitted From:  Home  Disposition: Home with home health services   Home Health:  Yes  Equipment/Devices: Resume home O2 as previous  Discharge Condition: Stable  CODE STATUS: DNR  Diet recommendation:  Diet Order            Diet - low sodium heart healthy        Diet Heart Room service appropriate? Yes; Fluid consistency: Thin  Diet effective now               Brief Summary: See H&P, Labs, Consult and Test reports for all details in brief, Patient is a 85 y.o. female with PMHx of chronic systolic heart failure, CAD s/p CABG, mechanical aortic valve on anticoagulation, COPD on 3 L of oxygen at home-presented with shortness of breath-found to have acute hypoxic respiratory failure in the setting of COVID-19 pneumonia and decompensated systolic heart failure  Brief Hospital Course: Acute on chronic hypoxic Resp Failure due to Covid 19 Viral pneumonia and decompensated systolic heart failure (EF 25-30% March 2020): Stable-on just 2 L of oxygen.  Treated with Lasix along with steroids and remdesivir, inflammatory markers are slowly downtrending.  Volume status is stable-on oral diuretic regimen.  Patient will complete remdesivir on 12/15-she will be discharged on a few days of tapering steroids.    COVID-19 Labs:  Recent Labs    10/28/19 0505 10/29/19 0326 10/30/19 0428  DDIMER 1.48* 0.75* 0.84*  FERRITIN 1,999* 1,131* 769*  CRP 8.4* 5.5* 3.8*    Lab Results  Component Value Date   SARSCOV2NAA DETECTED (A) 10/20/2019      COVID-19 Medications: Steroids: 12/11>>12/15 Remdesivir: 12/10>>  Other medications: Diuretics:Euvolemic, negative balance of 2.3 L-on oral diuretic regimen Antibiotics:Not needed as no evidence of bacterial infection Insulin: CBG stable with SSI.  A1c 5.7 on 12/12.  Nausea/Vomiting: Probably due to COVID-19-continue supportive care-as needed antiemetics.  Elevated troponin: Secondary demand ischemia-prior hospitalist Dr. Marinus Maw with cardiology-recommendations are for conservative management.  Transaminitis: Appears mild-secondary to COVID-19-has resolved by the time of discharge.  AKI on CKD stage IIIa: AKI improved-continue to monitor closely while on diuretics.  COPD-on 3 L of oxygen at home: No signs of exacerbation-continue bronchodilators  CAD s/p CABG: No anginal symptoms-see above regarding troponins  History of aortic valve replacement-mechanical valve: On Coumadin-INR supratherapeutic despite of holding Coumadin for several days-managed by pharmacy.  Spoke with pharmacy team-continue to hold Coumadin on discharge until INR is checked in 2 days.-Case management will arrange for INR check by home health RN on 12/17 subsequently dosing of Coumadin will be adjusted by the Coumadin clinic.  Dementia: Appears to be mild-continue Aricept  RN pressure injury documentation: Pressure Injury 07/23/18 Stage I -  Intact skin with non-blanchable redness of a localized area usually over a bony prominence. (Active)  07/23/18 1821  Location: Ischial tuberosity  Location Orientation: Medial  Staging: Stage I -  Intact skin with non-blanchable redness of a localized area usually over a bony prominence.  Wound Description (Comments):   Present on Admission: Yes     Pressure Injury 10/26/19 Buttocks  Medial Stage I -  Intact skin with non-blanchable redness of a localized area usually over a bony prominence. (Active)  10/26/19 1315  Location: Buttocks  Location  Orientation: Medial  Staging: Stage I -  Intact skin with non-blanchable redness of a localized area usually over a bony prominence.  Wound Description (Comments):   Present on Admission: Yes    Procedures/Studies: None  Discharge Diagnoses:  Active Problems:   Pneumonia due to COVID-19 virus   Discharge Instructions:    Person Under Monitoring Name: [name]  Location: [address] [place] [address]   Infection Prevention Recommendations for Individuals Confirmed to have, or Being Evaluated for, 2019 Novel Coronavirus (COVID-19) Infection Who Receive Care at Home  Individuals who are confirmed to have, or are being evaluated for, COVID-19 should follow the prevention steps below until a healthcare provider or local or state health department says they can return to normal activities.  Stay home except to get medical care You should restrict activities outside your home, except for getting medical care. Do not go to work, school, or public areas, and do not use public transportation or taxis.  Call ahead before visiting your doctor Before your medical appointment, call the healthcare provider and tell them that you have, or are being evaluated for, COVID-19 infection. This will help the healthcare provider's office take steps to keep other people from getting infected. Ask your healthcare provider to call the local or state health department.  Monitor your symptoms Seek prompt medical attention if your illness is worsening (e.g., difficulty breathing). Before going to your medical appointment, call the healthcare provider and tell them that you have, or are being evaluated for, COVID-19 infection. Ask your healthcare provider to call the local or state health department.  Wear a facemask You should wear a facemask that covers your nose and mouth when you are in the same room with other people and when you visit a healthcare provider. People who live  with or visit you should also wear a facemask while they are in the same room with you.  Separate yourself from other people in your home As much as possible, you should stay in a different room from other people in your home. Also, you should use a separate bathroom, if available.  Avoid sharing household items You should not share dishes, drinking glasses, cups, eating utensils, towels, bedding, or other items with other people in your home. After using these items, you should wash them thoroughly with soap and water.  Cover your coughs and sneezes Cover your mouth and nose with a tissue when you cough or sneeze, or you can cough or sneeze into your sleeve. Throw used tissues in a lined trash can, and immediately wash your hands with soap and water for at least 20 seconds or use an alcohol-based hand rub.  Wash your [employer] your hands often and thoroughly with soap and water for at least 20 seconds. You can use an alcohol-based hand sanitizer if soap and water are not available and if your hands are not visibly dirty. Avoid touching your eyes, nose, and mouth with unwashed hands.   Prevention Steps for Caregivers and Household Members of Individuals Confirmed to have, or Being Evaluated for, COVID-19 Infection Being Cared for in the Home  If you live with, or provide care at home for, a person confirmed to have, or being evaluated for, COVID-19 infection please follow these guidelines to prevent infection:  Follow healthcare provider's  instructions Make sure that you understand and can help the patient follow any healthcare provider instructions for all care.  Provide for the patient's basic needs You should help the patient with basic needs in the home and provide support for getting groceries, prescriptions, and other personal needs.  Monitor the patient's symptoms If they are getting sicker, call his or her medical provider and tell them that the patient has, or is being  evaluated for, COVID-19 infection. This will help the healthcare provider's office take steps to keep other people from getting infected. Ask the healthcare provider to call the local or state health department.  Limit the number of people who have contact with the patient  If possible, have only one caregiver for the patient.  Other household members should stay in another home or place of residence. If this is not possible, they should stay  in another room, or be separated from the patient as much as possible. Use a separate bathroom, if available.  Restrict visitors who do not have an essential need to be in the home.  Keep older adults, very young children, and other sick people away from the patient Keep older adults, very young children, and those who have compromised immune systems or chronic health conditions away from the patient. This includes people with chronic heart, lung, or kidney conditions, diabetes, and cancer.  Ensure good ventilation Make sure that shared spaces in the home have good air flow, such as from an air conditioner or an opened window, weather permitting.  Wash your hands often  Wash your hands often and thoroughly with soap and water for at least 20 seconds. You can use an alcohol based hand sanitizer if soap and water are not available and if your hands are not visibly dirty.  Avoid touching your eyes, nose, and mouth with unwashed hands.  Use disposable paper towels to dry your hands. If not available, use dedicated cloth towels and replace them when they become wet.  Wear a facemask and gloves  Wear a disposable facemask at all times in the room and gloves when you touch or have contact with the patient's blood, body fluids, and/or secretions or excretions, such as sweat, saliva, sputum, nasal mucus, vomit, urine, or feces.  Ensure the mask fits over your nose and mouth tightly, and do not touch it during use.  Throw out disposable facemasks and  gloves after using them. Do not reuse.  Wash your hands immediately after removing your facemask and gloves.  If your personal clothing becomes contaminated, carefully remove clothing and launder. Wash your hands after handling contaminated clothing.  Place all used disposable facemasks, gloves, and other waste in a lined container before disposing them with other household waste.  Remove gloves and wash your hands immediately after handling these items.  Do not share dishes, glasses, or other household items with the patient  Avoid sharing household items. You should not share dishes, drinking glasses, cups, eating utensils, towels, bedding, or other items with a patient who is confirmed to have, or being evaluated for, COVID-19 infection.  After the person uses these items, you should wash them thoroughly with soap and water.  Wash laundry thoroughly  Immediately remove and wash clothes or bedding that have blood, body fluids, and/or secretions or excretions, such as sweat, saliva, sputum, nasal mucus, vomit, urine, or feces, on them.  Wear gloves when handling laundry from the patient.  Read and follow directions on labels of laundry or clothing items and  detergent. In general, wash and dry with the warmest temperatures recommended on the label.  Clean all areas the individual has used often  Clean all touchable surfaces, such as counters, tabletops, doorknobs, bathroom fixtures, toilets, phones, keyboards, tablets, and bedside tables, every day. Also, clean any surfaces that may have blood, body fluids, and/or secretions or excretions on them.  Wear gloves when cleaning surfaces the patient has come in contact with.  Use a diluted bleach solution (e.g., dilute bleach with 1 part bleach and 10 parts water) or a household disinfectant with a label that says EPA-registered for coronaviruses. To make a bleach solution at home, add 1 tablespoon of bleach to 1 quart (4 cups) of water. For  a larger supply, add  cup of bleach to 1 gallon (16 cups) of water.  Read labels of cleaning products and follow recommendations provided on product labels. Labels contain instructions for safe and effective use of the cleaning product including precautions you should take when applying the product, such as wearing gloves or eye protection and making sure you have good ventilation during use of the product.  Remove gloves and wash hands immediately after cleaning.  Monitor yourself for signs and symptoms of illness Caregivers and household members are considered close contacts, should monitor their health, and will be asked to limit movement outside of the home to the extent possible. Follow the monitoring steps for close contacts listed on the symptom monitoring form.   ? If you have additional questions, contact your local health department or call the epidemiologist on call at [phone] (available 24/7). ? This guidance is subject to change. For the most up-to-date guidance from CDC, please refer to their website: [employer]hu    Activity:  As tolerated with Full fall precautions use walker/cane & assistance as needed  Discharge Instructions    (HEART FAILURE PATIENTS) Call MD:  Anytime you have any of the following symptoms: 1) 3 pound weight gain in 24 hours or 5 pounds in 1 week 2) shortness of breath, with or without a dry hacking cough 3) swelling in the hands, feet or stomach 4) if you have to sleep on extra pillows at night in order to breathe.   Complete by: As directed    Call MD for:  difficulty breathing, headache or visual disturbances   Complete by: As directed    Call MD for:  extreme fatigue   Complete by: As directed    Call MD for:  persistant dizziness or light-headedness   Complete by: As directed    Call MD for:  persistant nausea and vomiting   Complete by: As directed    Diet - low sodium heart  healthy   Complete by: As directed    Discharge instructions   Complete by: As directed    Follow with Primary MD  Kari Baars, MD in 1-2 weeks  Please get a complete blood count and chemistry panel checked by your Primary MD at your next visit, and again as instructed by your Primary MD.  Get Medicines reviewed and adjusted: Please take all your medications with you for your next visit with your Primary MD  Laboratory/radiological data: Please request your Primary MD to go over all hospital tests and procedure/radiological results at the follow up, please ask your Primary MD to get all Hospital records sent to his/her office.  In some cases, they will be blood work, cultures and biopsy results pending at the time of your discharge. Please request that your primary  care M.D. follows up on these results.  Also Note the following: If you experience worsening of your admission symptoms, develop shortness of breath, life threatening emergency, suicidal or homicidal thoughts you must seek medical attention immediately by calling 911 or calling your MD immediately  if symptoms less severe.  You must read complete instructions/literature along with all the possible adverse reactions/side effects for all the Medicines you take and that have been prescribed to you. Take any new Medicines after you have completely understood and accpet all the possible adverse reactions/side effects.   Do not drive when taking Pain medications or sleeping medications (Benzodaizepines)  Do not take more than prescribed Pain, Sleep and Anxiety Medications. It is not advisable to combine anxiety,sleep and pain medications without talking with your primary care practitioner  Special Instructions: If you have smoked or chewed Tobacco  in the last 2 yrs please stop smoking, stop any regular Alcohol  and or any Recreational drug use.  Wear Seat belts while driving.  Please note: You were cared for by a hospitalist  during your hospital stay. Once you are discharged, your primary care physician will handle any further medical issues. Please note that NO REFILLS for any discharge medications will be authorized once you are discharged, as it is imperative that you return to your primary care physician (or establish a relationship with a primary care physician if you do not have one) for your post hospital discharge needs so that they can reassess your need for medications and monitor your lab values.   1.  3 weeks of isolation from 10/20/2019  2.  Hold Coumadin as INR is supratherapeutic-check INR on 12/17-been confirmed with Coumadin clinic as to what dosing of Coumadin you should take.   Increase activity slowly   Complete by: As directed      Allergies as of 10/30/2019      Reactions   Codeine Nausea And Vomiting   Penicillins Other (See Comments)   Unknown reaction - listed on MAR Has patient had a PCN reaction causing immediate rash, facial/tongue/throat swelling, SOB or lightheadedness with hypotension: Unknown Has patient had a PCN reaction causing severe rash involving mucus membranes or skin necrosis: Unknown Has patient had a PCN reaction that required hospitalization: Unknown Has patient had a PCN reaction occurring within the last 10 years: Unknown If all of the above answers are "NO", then may proceed with Cephalosporin use.      Medication List    STOP taking these medications   doxycycline 100 MG tablet Commonly known as: VIBRA-TABS   methylPREDNISolone 4 MG Tbpk tablet Commonly known as: MEDROL DOSEPAK   predniSONE 50 MG tablet Commonly known as: DELTASONE     TAKE these medications   acetaminophen 325 MG tablet Commonly known as: TYLENOL Take 650 mg by mouth every 4 (four) hours as needed.   ALPRAZolam 0.25 MG tablet Commonly known as: XANAX Take 0.25 mg by mouth at bedtime as needed for anxiety.   Anoro Ellipta 62.5-25 MCG/INH Aepb Generic drug:  umeclidinium-vilanterol Inhale 1 puff into the lungs daily.   atorvastatin 40 MG tablet Commonly known as: LIPITOR Take 1 tablet (40 mg total) by mouth daily.   baclofen 10 MG tablet Commonly known as: LIORESAL Take 10 mg by mouth as needed for muscle spasms.   carvedilol 6.25 MG tablet Commonly known as: COREG Take 1 tablet (6.25 mg total) by mouth 2 (two) times daily with a meal. Keep scheduled appointment   dexamethasone 6 MG  tablet Commonly known as: DECADRON Take 1 tablet (6 mg total) by mouth daily.   docusate sodium 100 MG capsule Commonly known as: COLACE Take 100 mg by mouth at bedtime.   donepezil 10 MG tablet Commonly known as: ARICEPT TAKE ONE TABLET BY MOUTH ONCE DAILY.   ergocalciferol 1.25 MG (50000 UT) capsule Commonly known as: VITAMIN D2 Take 50,000 Units by mouth every Tuesday.   ferrous sulfate 325 (65 FE) MG EC tablet Take 325 mg by mouth 2 (two) times daily.   furosemide 20 MG tablet Commonly known as: LASIX TAKE ONE TABLET BY MOUTH ONCE DAILY.TAKE EXTRA IF NEEDED.   hydrocortisone 2.5 % rectal cream Commonly known as: ANUSOL-HC Place 1 application rectally 3 (three) times daily as needed for hemorrhoids or anal itching.   levalbuterol 0.63 MG/3ML nebulizer solution Commonly known as: XOPENEX Take 0.63 mg by nebulization every evening.   meclizine 25 MG tablet Commonly known as: ANTIVERT Take 25 mg by mouth 3 (three) times daily as needed for dizziness.   Melatonin 3 MG Tabs Take 1 tablet by mouth at bedtime.   multivitamin-lutein Caps capsule Take 1 capsule by mouth daily.   pregabalin 75 MG capsule Commonly known as: Lyrica Take 1 capsule (75 mg total) by mouth every other day.   spironolactone 25 MG tablet Commonly known as: ALDACTONE TAKE (1/2) TABLET BY MOUTH DAILY.   warfarin 2.5 MG tablet Commonly known as: COUMADIN Take as directed. If you are unsure how to take this medication, talk to your nurse or doctor. Original  instructions: Take 1 tablet (2.5 mg total) by mouth every evening. DO NOT TAKE COUMADIN UNTIL YOUR INR IS DRAWN ON 12/17-AND YOU RECEIVE further instructions from Coumadin clinic. Start taking on: November 01, 2019 What changed:   additional instructions  These instructions start on November 01, 2019. If you are unsure what to do until then, ask your doctor or other care provider.  Another medication with the same name was removed. Continue taking this medication, and follow the directions you see here.            Durable Medical Equipment  (From admission, onward)         Start     Ordered   10/29/19 1307  For home use only DME Walker rolling  Once    Question:  Patient needs a walker to treat with the following condition  Answer:  Physical deconditioning   10/29/19 1308         Follow-up Information    [facility] Follow [facility].   Specialty: Home Health Services Why: A [facility] will contact you to arrange start date and time for your Home Health nurse and physical therapist. Contact information: [address] [place] [address] [phone]        Kari BaarsHawkins, Edward, MD. Schedule an appointment as soon as possible for a visit in 2 week(s).   Specialty: Pulmonary Disease Contact information: [address] [place] [address] [phone]        Lewayne Buntingrenshaw, Brian S, MD Follow up in 1 month(s).   Specialty: Cardiology Contact information: [address] [place] [address] [phone]          Allergies  Allergen Reactions  . Codeine Nausea And Vomiting  . Penicillins Other (See Comments)    Unknown reaction - listed on MAR Has patient had a PCN reaction causing immediate rash, facial/tongue/throat swelling, SOB or lightheadedness with hypotension: Unknown  Has patient had a PCN reaction causing severe rash involving mucus membranes or skin necrosis: Unknown Has patient had a PCN  reaction that required hospitalization: Unknown Has patient had a PCN reaction occurring within the last 10 years: Unknown If all of the above answers are "NO", then may proceed with Cephalosporin use.    Consultations:   None   Other Procedures/Studies: DG Chest 2 View  Result Date: 10/20/2019 CLINICAL DATA:  Fatigue, cough and congestion since yesterday, former smoker, COPD, CHF, hypertension, dilated cardiomyopathy, prior stenting EXAM: CHEST - 2 VIEW COMPARISON:  07/16/2018 FINDINGS: Enlargement of cardiac silhouette post CABG and AVR. Mediastinal contours and pulmonary vascularity normal. Atherosclerotic calcification aorta. Emphysematous and bronchitic changes consistent with COPD. Minimal basilar atelectasis and small bibasilar pleural effusions, greater on RIGHT. No acute infiltrate or pneumothorax. Bones demineralized with chronic appearing height losses of adjacent midthoracic vertebra. IMPRESSION: Enlargement of cardiac silhouette post CABG and AVR. COPD changes with small bibasilar pleural effusions and minimal atelectasis, greater on RIGHT. Aortic Atherosclerosis (ICD10-I70.0) and Emphysema (ICD10-J43.9). Electronically Signed   By: Ulyses Southward M.D.   On: 10/20/2019 13:47   CT Head Wo Contrast  Result Date: 10/25/2019 CLINICAL DATA:  Altered mental status. Coronavirus infection. EXAM: CT HEAD WITHOUT CONTRAST TECHNIQUE: Contiguous axial images were obtained from the base of the skull through the vertex without intravenous contrast. COMPARISON:  12/27/2008 FINDINGS: Brain: Age related atrophy. Chronic small-vessel ischemic changes of the white matter. No sign of acute infarction, mass lesion, hemorrhage, hydrocephalus or extra-axial collection. Vascular: There is atherosclerotic calcification of the major vessels at the base of the brain. Skull: Negative Sinuses/Orbits: Clear/normal Other: None IMPRESSION: No acute finding by CT. Age related atrophy and chronic small-vessel ischemic  changes of the white matter. Electronically Signed   By: Paulina Fusi M.D.   On: 10/25/2019 20:16   CT Chest Wo Contrast  Result Date: 10/25/2019 CLINICAL DATA:  Altered mental status. Coronavirus infection. EXAM: CT CHEST WITHOUT CONTRAST TECHNIQUE: Multidetector CT imaging of the chest was performed following the standard protocol without IV contrast. COMPARISON:  Chest radiography same day. FINDINGS: Cardiovascular: Mild cardiomegaly. Previous median sternotomy. Aortic atherosclerosis. Coronary artery calcification. Mediastinum/Nodes: No hilar mass or adenopathy suspected on this noncontrast study. Lungs/Pleura: Background emphysema. Widespread bilateral patchy pulmonary infiltrates, more extensive in the right lung than the left, affecting all lobes, consistent with the clinical diagnosis of viral pneumonia. No pleural effusion. Upper Abdomen: Normal Musculoskeletal: Chronic degenerative changes of the spine. IMPRESSION: 1. Widespread bilateral patchy pulmonary infiltrates, more extensive in the right lung than the left, consistent with the clinical diagnosis of viral pneumonia. 2. Emphysema and aortic atherosclerosis. Aortic Atherosclerosis (ICD10-I70.0) and Emphysema (ICD10-J43.9). Electronically Signed   By: Paulina Fusi M.D.   On: 10/25/2019 20:20   DG Chest Port 1 View  Result Date: 10/25/2019 CLINICAL DATA:  Shortness of breath and COVID-19 positivity EXAM: PORTABLE CHEST 1 VIEW COMPARISON:  10/20/2011 FINDINGS: Cardiac shadow is enlarged in size and stable. Aortic calcifications are noted. New diffuse bilateral infiltrates are seen right greater than left significantly increased when compared with the prior exam. These changes are consistent with the given clinical history of COVID-19 positivity. Postsurgical changes are noted. No acute bony abnormality is seen. IMPRESSION: New diffuse bilateral infiltrative changes right greater than left consistent with the given history. Electronically Signed    By: Alcide Clever M.D.   On: 10/25/2019 19:53     TODAY-DAY OF DISCHARGE:  Subjective:   [name] today has  no headache,no chest abdominal pain,no new weakness tingling or numbness, feels much better wants to go home today.   Objective:   Blood pressure (!) 154/41, pulse 73, temperature (!) 97.3 F (36.3 C), temperature source Oral, resp. rate 16, SpO2 95 %.  Intake/Output Summary (Last 24 hours) at 10/30/2019 1120 Last data filed at 10/30/2019 0800 Gross per 24 hour  Intake 260 ml  Output 300 ml  Net -40 ml   There were no vitals filed for this visit.  Exam: Awake Alert, Oriented *3, No new F.N deficits, Normal affect [place].AT,PERRAL Supple Neck,No JVD, No cervical lymphadenopathy appriciated.  Symmetrical Chest wall movement, Good air movement bilaterally, CTAB RRR,No Gallops,Rubs or new Murmurs, No Parasternal Heave +ve B.Sounds, Abd Soft, Non tender, No organomegaly appriciated, No rebound -guarding or rigidity. No Cyanosis, Clubbing or edema, No new Rash or bruise   PERTINENT RADIOLOGIC STUDIES: DG Chest 2 View  Result Date: 10/20/2019 CLINICAL DATA:  Fatigue, cough and congestion since yesterday, former smoker, COPD, CHF, hypertension, dilated cardiomyopathy, prior stenting EXAM: CHEST - 2 VIEW COMPARISON:  07/16/2018 FINDINGS: Enlargement of cardiac silhouette post CABG and AVR. Mediastinal contours and pulmonary vascularity normal. Atherosclerotic calcification aorta. Emphysematous and bronchitic changes consistent with COPD. Minimal basilar atelectasis and small bibasilar pleural effusions, greater on RIGHT. No acute infiltrate or pneumothorax. Bones demineralized with chronic appearing height losses of adjacent midthoracic vertebra. IMPRESSION: Enlargement of cardiac silhouette post CABG and AVR. COPD changes with small bibasilar pleural effusions and minimal atelectasis, greater on RIGHT. Aortic Atherosclerosis (ICD10-I70.0) and Emphysema (ICD10-J43.9).  Electronically Signed   By: Ulyses Southward M.D.   On: 10/20/2019 13:47   CT Head Wo Contrast  Result Date: 10/25/2019 CLINICAL DATA:  Altered mental status. Coronavirus infection. EXAM: CT HEAD WITHOUT CONTRAST TECHNIQUE: Contiguous axial images were obtained from the base of the skull through the vertex without intravenous contrast. COMPARISON:  12/27/2008 FINDINGS: Brain: Age related atrophy. Chronic small-vessel ischemic changes of the white matter. No sign of acute infarction, mass lesion, hemorrhage, hydrocephalus or extra-axial collection. Vascular: There is atherosclerotic calcification of the major vessels at the base of the brain. Skull: Negative Sinuses/Orbits: Clear/normal Other: None IMPRESSION: No acute finding by CT. Age related atrophy and chronic small-vessel ischemic changes of the white matter. Electronically Signed   By: Paulina Fusi M.D.   On: 10/25/2019 20:16   CT Chest Wo Contrast  Result Date: 10/25/2019 CLINICAL DATA:  Altered mental status. Coronavirus infection. EXAM: CT CHEST WITHOUT CONTRAST TECHNIQUE: Multidetector CT imaging of the chest was performed following the standard protocol without IV contrast. COMPARISON:  Chest radiography same day. FINDINGS: Cardiovascular: Mild cardiomegaly. Previous median sternotomy. Aortic atherosclerosis. Coronary artery calcification. Mediastinum/Nodes: No hilar mass or adenopathy suspected on this noncontrast study. Lungs/Pleura: Background emphysema. Widespread bilateral patchy pulmonary infiltrates, more extensive in the right lung than the left, affecting all lobes, consistent with the clinical diagnosis of viral pneumonia. No pleural effusion. Upper Abdomen: Normal Musculoskeletal: Chronic degenerative changes of the spine. IMPRESSION: 1. Widespread bilateral patchy pulmonary infiltrates, more extensive in the right lung than the left, consistent with the clinical diagnosis of viral pneumonia. 2. Emphysema and aortic atherosclerosis. Aortic  Atherosclerosis (ICD10-I70.0) and Emphysema (ICD10-J43.9). Electronically Signed   By: Paulina Fusi M.D.   On: 10/25/2019 20:20   DG Chest Port 1 View  Result Date: 10/25/2019 CLINICAL DATA:  Shortness of breath and COVID-19 positivity EXAM: PORTABLE CHEST 1 VIEW COMPARISON:  10/20/2011 FINDINGS: Cardiac shadow is enlarged in size and stable. Aortic  calcifications are noted. New diffuse bilateral infiltrates are seen right greater than left significantly increased when compared with the prior exam. These changes are consistent with the given clinical history of COVID-19 positivity. Postsurgical changes are noted. No acute bony abnormality is seen. IMPRESSION: New diffuse bilateral infiltrative changes right greater than left consistent with the given history. Electronically Signed   By: Alcide Clever M.D.   On: 10/25/2019 19:53     PERTINENT LAB RESULTS: CBC: Recent Labs    10/29/19 0326 10/30/19 0428  WBC 8.0 8.8  HGB 10.6* 10.7*  HCT 32.8* 32.0*  PLT 156 147*   CMET CMP     Component Value Date/Time   NA 139 10/30/2019 0428   NA 144 07/25/2019 1041   K 3.8 10/30/2019 0428   CL 101 10/30/2019 0428   CO2 29 10/30/2019 0428   GLUCOSE 154 (H) 10/30/2019 0428   BUN 65 (H) 10/30/2019 0428   BUN 27 07/25/2019 1041   CREATININE 1.42 (H) 10/30/2019 0428   CREATININE 1.08 (H) 03/29/2017 1150   CALCIUM 8.4 (L) 10/30/2019 0428   CALCIUM 8.4 (L) 07/21/2018 0441   PROT 5.7 (L) 10/30/2019 0428   ALBUMIN 3.1 (L) 10/30/2019 0428   AST 37 10/30/2019 0428   ALT 33 10/30/2019 0428   ALKPHOS 54 10/30/2019 0428   BILITOT 1.3 (H) 10/30/2019 0428   GFRNONAA 32 (L) 10/30/2019 0428   GFRNONAA 54 (L) 03/18/2015 1215   GFRAA 38 (L) 10/30/2019 0428   GFRAA 63 03/18/2015 1215    GFR CrCl cannot be calculated (Unknown ideal weight.). No results for input(s): LIPASE, AMYLASE in the last 72 hours. No results for input(s): CKTOTAL, CKMB, CKMBINDEX, TROPONINI in the last 72 hours. Invalid input(s):  POCBNP Recent Labs    10/29/19 0326 10/30/19 0428  DDIMER 0.75* 0.84*   No results for input(s): HGBA1C in the last 72 hours. No results for input(s): CHOL, HDL, LDLCALC, TRIG, CHOLHDL, LDLDIRECT in the last 72 hours. No results for input(s): TSH, T4TOTAL, T3FREE, THYROIDAB in the last 72 hours.  Invalid input(s): FREET3 Recent Labs    10/29/19 0326 10/30/19 0428  FERRITIN 1,131* 769*   Coags: Recent Labs    10/29/19 0326 10/30/19 0428  INR 4.2* 4.1*   Microbiology: Recent Results (from the past 240 hour(s))  Novel Coronavirus, NAA (Hosp order, Send-out to Ref Lab; TAT 18-24 hrs     Status: Abnormal   Collection Time: 10/20/19  1:31 PM   Specimen: Nasopharyngeal Swab; Respiratory  Result Value Ref Range Status   SARS-CoV-2, NAA DETECTED (A) NOT DETECTED Final    Comment: (NOTE)                  Client Requested Flag This nucleic acid amplification test was developed and its performance characteristics determined by [employer]. Nucleic acid amplification tests include PCR and TMA. This test has not been FDA cleared or approved. This test has been authorized by FDA under an Emergency Use Authorization (EUA). This test is only authorized for the duration of time the declaration that circumstances exist justifying the authorization of the emergency use of in vitro diagnostic tests for detection of SARS-CoV-2 virus and/or diagnosis of COVID-19 infection under section 564(b)(1) of the Act, 21 U.S.C. 960AVW-0(J) (1), unless the authorization is terminated or revoked sooner. When diagnostic testing is negative, the possibility of a false negative result should be considered in the context of a patient's recent exposures and the presence of clinical signs and symptoms consistent  with COVID-19. An individual without symptoms of COVID-  19 and who is not shedding SARS-CoV-2 virus would expect to have a negative (not detected) result in this assay. Performed At: [facility] [address] [place], [place] [id number] Jolene Schimke MD WU:[id number]    Coronavirus Source NASOPHARYNGEAL  Final    Comment: Performed at [facility], [address], [place], [place] [address]  Urine culture     Status: None   Collection Time: 10/25/19  8:52 PM   Specimen: Urine, Catheterized  Result Value Ref Range Status   Specimen Description   Final    URINE, CATHETERIZED Performed at [facility], [address], [place], [place] [address]    Special Requests   Final    Normal Performed at [facility], [address], [place], [place] [address]    Culture   Final    NO GROWTH Performed at [facility], [address] [place], [place] [address]    Report Status 10/27/2019 FINAL  Final  Blood Culture (routine x 2)     Status: None   Collection Time: 10/25/19  9:05 PM   Specimen: BLOOD LEFT HAND  Result Value Ref Range Status   Specimen Description BLOOD LEFT HAND  Final   Special Requests   Final    BOTTLES DRAWN AEROBIC AND ANAEROBIC Blood Culture adequate volume   Culture   Final    NO GROWTH 5 DAYS Performed at [facility], [address], [place], [place] [address]    Report Status 10/30/2019 FINAL  Final  Blood Culture (routine x 2)     Status: None   Collection Time: 10/25/19  9:16 PM   Specimen: BLOOD RIGHT ARM  Result Value Ref Range Status   Specimen Description BLOOD RIGHT ARM  Final   Special Requests   Final    BOTTLES DRAWN AEROBIC AND ANAEROBIC Blood Culture adequate volume   Culture   Final    NO GROWTH 5 DAYS Performed at [facility], [address], [place], [place] [address]    Report Status 10/30/2019 FINAL  Final    FURTHER DISCHARGE INSTRUCTIONS:  Get Medicines reviewed and adjusted: Please take all your medications with you for your next visit with your Primary MD  Laboratory/radiological data: Please request your Primary MD to go over all hospital tests and procedure/radiological results at the  follow up, please ask your Primary MD to get all Hospital records sent to his/her office.  In some cases, they will be blood work, cultures and biopsy results pending at the time of your discharge. Please request that your primary care M.D. goes through all the records of your hospital data and follows up on these results.  Also Note the following: If you experience worsening of your admission symptoms, develop shortness of breath, life threatening emergency, suicidal or homicidal thoughts you must seek medical attention immediately by calling 911 or calling your MD immediately  if symptoms less severe.  You must read complete instructions/literature along with all the possible adverse reactions/side effects for all the Medicines you take and that have been prescribed to you. Take any new Medicines after you have completely understood and accpet all the possible adverse reactions/side effects.   Do not drive when taking Pain medications or sleeping medications (Benzodaizepines)  Do not take more than prescribed Pain, Sleep and Anxiety Medications. It is not advisable to combine anxiety,sleep and pain medications without talking with your primary care practitioner  Special  Instructions: If you have smoked or chewed Tobacco  in the last 2 yrs please stop smoking, stop any regular Alcohol  and or any Recreational drug use.  Wear Seat belts while driving.  Please note: You were cared for by a hospitalist during your hospital stay. Once you are discharged, your primary care physician will handle any further medical issues. Please note that NO REFILLS for any discharge medications will be authorized once you are discharged, as it is imperative that you return to your primary care physician (or establish a relationship with a primary care physician if you do not have one) for your post hospital discharge needs so that they can reassess your need for medications and monitor your lab values.  Total Time  spent coordinating discharge including counseling, education and face to face time equals35 minutes.  SignedJeoffrey Massed 10/30/2019 11:20 AM

## 2019-10-30 NOTE — Progress Notes (Signed)
Called patient family and reviewed discharge instructions

## 2019-10-30 NOTE — Progress Notes (Signed)
ANTICOAGULATION CONSULT NOTE  Pharmacy Consult for Warfarin Indication: s/p St Jude AVR  Vital Signs: Temp: 97.3 F (36.3 C) (12/15 0800) Temp Source: Oral (12/15 0800) BP: 154/41 (12/15 0800) Pulse Rate: 73 (12/15 0800)  Labs: Recent Labs    10/28/19 0505 10/29/19 0326 10/30/19 0428  HGB 11.2* 10.6* 10.7*  HCT 34.2* 32.8* 32.0*  PLT 158 156 147*  LABPROT 35.1* 40.9* 39.6*  INR 3.5* 4.2* 4.1*  CREATININE 1.60* 1.27*  --      Medical History: Past Medical History:  Diagnosis Date  . Carotid artery stenosis    Without infarction  . Chronic combined systolic (congestive) and diastolic (congestive) heart failure ([place])   . COPD (chronic obstructive pulmonary disease) ([place])   . Coronary artery disease   . Heart valve replaced by other means   . Hypercholesterolemia    Pure  . Hypertension    Unspecified  . LBBB (left bundle branch block)   . Macular degeneration (senile) of retina, unspecified   . On home O2   . Postsurgical aortocoronary bypass status   . Stroke ([place])   . Transient global amnesia   . Unspecified hereditary and idiopathic peripheral neuropathy   . Unspecified vitamin D deficiency    Home warfarin dose: 2.5 mg daily.  LD 12/9  Assessment: 85 y.o. F presents with COVID-19. Pt on warfarin PTA for St Jude AVR. Admission INR supratherapeutic at 3.8 and has remained elevated despite holding warfarin x3 days. Pt given reduced dose (0.5mg ) on 12/12 (in anticipation of INR drop) but INR remains elevated, 4.1. Hgb low/stable at 10.7, Plt stable ~ 150k.  D-dimer 0.84  Goal of Therapy:  INR 2-3 Monitor platelets by anticoagulation protocol: Yes   Plan:  Continue to hold warfarin. Daily PT, INR  [name] PharmD, BCPS Clinical pharmacist phone 7am- 5pm: [phone] 10/30/2019 8:07 AM

## 2019-10-30 NOTE — TOC Transition Note (Signed)
Transition of Care [facility]) - CM/SW Discharge Note   Patient Details  Name: [name] MRN: [id number] Date of Birth: November 28, 1925  [facility] CM/SW Contact:  Ninfa Meeker, RN Phone Number: [phone] (working remotely) 10/30/2019, 10:36 AM   Clinical Narrative:   Case manager spoke with patient's daughter, [name], via telephone to discuss discharge needs. Patient usually goes to coumadin clinic for lab work, due to [place] she can not. Case manager discussed need for [place], permission to setup with [facility] for HHRN/PT. [name] states that her mom lives in her own home with a care provider coming daily to assist. She will remain with patient 24/7 for next couple of weeks. [name] is recovering from [place] as well.     Final next level of care: Home w Home Health Services Barriers to Discharge: No Barriers Identified   Patient Goals and CMS Choice Patient states their goals for this hospitalization and ongoing recovery are:: per daughter: to get better   Choice offered to / list presented to : Adult Children  Discharge Placement                       Discharge Plan and Services In-house Referral: NA Discharge Planning Services: CM Consult Post Acute Care Choice: Home Health          DME Arranged: N/A DME Agency: NA       HH Arranged: RN, PT [place] Agency: [place] Date [facility] Contacted: 10/30/19 Time [place]: 1034 Representative spoke with at [place]: Adela Lank  Social Determinants of Health ([place]) Interventions     Readmission Risk Interventions No flowsheet data found.

## 2019-10-30 NOTE — Discharge Instructions (Signed)
Person Under Monitoring Name: [name]  Location: [address] [place] [address]   Infection Prevention Recommendations for Individuals Confirmed to have, or Being Evaluated for, 2019 Novel Coronavirus (COVID-19) Infection Who Receive Care at Home  Individuals who are confirmed to have, or are being evaluated for, COVID-19 should follow the prevention steps below until a healthcare provider or local or state health department says they can return to normal activities.  Stay home except to get medical care You should restrict activities outside your home, except for getting medical care. Do not go to work, school, or public areas, and do not use public transportation or taxis.  Call ahead before visiting your doctor Before your medical appointment, call the healthcare provider and tell them that you have, or are being evaluated for, COVID-19 infection. This will help the healthcare provider's office take steps to keep other people from getting infected. Ask your healthcare provider to call the local or state health department.  Monitor your symptoms Seek prompt medical attention if your illness is worsening (e.g., difficulty breathing). Before going to your medical appointment, call the healthcare provider and tell them that you have, or are being evaluated for, COVID-19 infection. Ask your healthcare provider to call the local or state health department.  Wear a facemask You should wear a facemask that covers your nose and mouth when you are in the same room with other people and when you visit a healthcare provider. People who live with or visit you should also wear a facemask while they are in the same room with you.  Separate yourself from other people in your home As much as possible, you should stay in a different room from other people in your home. Also, you should use a separate bathroom, if available.  Avoid sharing household items You should  not share dishes, drinking glasses, cups, eating utensils, towels, bedding, or other items with other people in your home. After using these items, you should wash them thoroughly with soap and water.  Cover your coughs and sneezes Cover your mouth and nose with a tissue when you cough or sneeze, or you can cough or sneeze into your sleeve. Throw used tissues in a lined trash can, and immediately wash your hands with soap and water for at least 20 seconds or use an alcohol-based hand rub.  Wash your [facility] your hands often and thoroughly with soap and water for at least 20 seconds. You can use an alcohol-based hand sanitizer if soap and water are not available and if your hands are not visibly dirty. Avoid touching your eyes, nose, and mouth with unwashed hands.   Prevention Steps for Caregivers and Household Members of Individuals Confirmed to have, or Being Evaluated for, COVID-19 Infection Being Cared for in the Home  If you live with, or provide care at home for, a person confirmed to have, or being evaluated for, COVID-19 infection please follow these guidelines to prevent infection:  Follow healthcare provider's instructions Make sure that you understand and can help the patient follow any healthcare provider instructions for all care.  Provide for the patient's basic needs You should help the patient with basic needs in the home and provide support for getting groceries, prescriptions, and other personal needs.  Monitor the patient's symptoms If they are getting sicker, call his or her medical provider and tell them that the patient has, or is being evaluated for, COVID-19 infection. This will help the healthcare provider's  office take steps to keep other people from getting infected. Ask the healthcare provider to call the local or state health department.  Limit the number of people who have contact with the patient  If possible, have only one caregiver for the  patient.  Other household members should stay in another home or place of residence. If this is not possible, they should stay  in another room, or be separated from the patient as much as possible. Use a separate bathroom, if available.  Restrict visitors who do not have an essential need to be in the home.  Keep older adults, very young children, and other sick people away from the patient Keep older adults, very young children, and those who have compromised immune systems or chronic health conditions away from the patient. This includes people with chronic heart, lung, or kidney conditions, diabetes, and cancer.  Ensure good ventilation Make sure that shared spaces in the home have good air flow, such as from an air conditioner or an opened window, weather permitting.  Wash your hands often  Wash your hands often and thoroughly with soap and water for at least 20 seconds. You can use an alcohol based hand sanitizer if soap and water are not available and if your hands are not visibly dirty.  Avoid touching your eyes, nose, and mouth with unwashed hands.  Use disposable paper towels to dry your hands. If not available, use dedicated cloth towels and replace them when they become wet.  Wear a facemask and gloves  Wear a disposable facemask at all times in the room and gloves when you touch or have contact with the patient's blood, body fluids, and/or secretions or excretions, such as sweat, saliva, sputum, nasal mucus, vomit, urine, or feces.  Ensure the mask fits over your nose and mouth tightly, and do not touch it during use.  Throw out disposable facemasks and gloves after using them. Do not reuse.  Wash your hands immediately after removing your facemask and gloves.  If your personal clothing becomes contaminated, carefully remove clothing and launder. Wash your hands after handling contaminated clothing.  Place all used disposable facemasks, gloves, and other waste in a lined  container before disposing them with other household waste.  Remove gloves and wash your hands immediately after handling these items.  Do not share dishes, glasses, or other household items with the patient  Avoid sharing household items. You should not share dishes, drinking glasses, cups, eating utensils, towels, bedding, or other items with a patient who is confirmed to have, or being evaluated for, COVID-19 infection.  After the person uses these items, you should wash them thoroughly with soap and water.  Wash laundry thoroughly  Immediately remove and wash clothes or bedding that have blood, body fluids, and/or secretions or excretions, such as sweat, saliva, sputum, nasal mucus, vomit, urine, or feces, on them.  Wear gloves when handling laundry from the patient.  Read and follow directions on labels of laundry or clothing items and detergent. In general, wash and dry with the warmest temperatures recommended on the label.  Clean all areas the individual has used often  Clean all touchable surfaces, such as counters, tabletops, doorknobs, bathroom fixtures, toilets, phones, keyboards, tablets, and bedside tables, every day. Also, clean any surfaces that may have blood, body fluids, and/or secretions or excretions on them.  Wear gloves when cleaning surfaces the patient has come in contact with.  Use a diluted bleach solution (e.g., dilute bleach with 1  part bleach and 10 parts water) or a household disinfectant with a label that says EPA-registered for coronaviruses. To make a bleach solution at home, add 1 tablespoon of bleach to 1 quart (4 cups) of water. For a larger supply, add  cup of bleach to 1 gallon (16 cups) of water.  Read labels of cleaning products and follow recommendations provided on product labels. Labels contain instructions for safe and effective use of the cleaning product including precautions you should take when applying the product, such as wearing gloves or  eye protection and making sure you have good ventilation during use of the product.  Remove gloves and wash hands immediately after cleaning.  Monitor yourself for signs and symptoms of illness Caregivers and household members are considered close contacts, should monitor their health, and will be asked to limit movement outside of the home to the extent possible. Follow the monitoring steps for close contacts listed on the symptom monitoring form.   ? If you have additional questions, contact your local health department or call the epidemiologist on call at [phone] (available 24/7). ? This guidance is subject to change. For the most up-to-date guidance from [facility], please refer to their website: [email or web address]

## 2019-10-30 NOTE — Care Management Important Message (Addendum)
Important Message  Patient Details  Name: [name] MRN: [id number] Date of Birth: [birth year]   Medicare Important Message Given:  Yes - Important Message mailed due to current National Emergency  Verbal consent obtained due to current National Emergency  Relationship to patient: Child Contact Name: [name] Call Date: 10/30/19  Time: 1326 Phone: [phone] Outcome: Spoke with contact Important Message mailed to: Patient address on file (temporary address on file)    Delorse Lek 10/30/2019, 1:26 PM

## 2019-10-30 NOTE — Patient Outreach (Signed)
[facility]) Care Management  10/30/2019  LANIKA COLGATE 1926-05-28 [id number]   RN Health Coach Hospitalization/Discipline Closure  Referral Date:  03/08/2019 Referral Source:  Insurance Plan Screening Reason for Referral:  Disease Management Education Insurance:  Medicare   Outreach Attempt:  Patient continues to be admitted to [facility] for Respiratory Distress related to COVID and Heart Failure.  Plan is for discharge today.  Hospital Liaison is following for plans of [place] Coordinator referral on discharge.  Plan:  RN Health Coach will close Disease Management case for increased acuity and plan for [place] Coordinator follow up at discharge per Hospital Liaison's recommendations.  RN Health Coach will send primary care provider Discipline Closure Letter.  [place] [phone] [email or web address]

## 2019-11-02 ENCOUNTER — Ambulatory Visit (INDEPENDENT_AMBULATORY_CARE_PROVIDER_SITE_OTHER): Payer: Medicare Other | Admitting: Cardiovascular Disease

## 2019-11-02 DIAGNOSIS — Z952 Presence of prosthetic heart valve: Secondary | ICD-10-CM | POA: Diagnosis not present

## 2019-11-02 DIAGNOSIS — J439 Emphysema, unspecified: Secondary | ICD-10-CM | POA: Diagnosis not present

## 2019-11-02 DIAGNOSIS — Z7952 Long term (current) use of systemic steroids: Secondary | ICD-10-CM | POA: Diagnosis not present

## 2019-11-02 DIAGNOSIS — U071 COVID-19: Secondary | ICD-10-CM | POA: Diagnosis not present

## 2019-11-02 DIAGNOSIS — Z9981 Dependence on supplemental oxygen: Secondary | ICD-10-CM | POA: Diagnosis not present

## 2019-11-02 DIAGNOSIS — I700 Atherosclerosis of aorta: Secondary | ICD-10-CM | POA: Diagnosis not present

## 2019-11-02 DIAGNOSIS — J1289 Other viral pneumonia: Secondary | ICD-10-CM | POA: Diagnosis not present

## 2019-11-02 DIAGNOSIS — Z5181 Encounter for therapeutic drug level monitoring: Secondary | ICD-10-CM | POA: Diagnosis not present

## 2019-11-02 DIAGNOSIS — F0280 Dementia in other diseases classified elsewhere without behavioral disturbance: Secondary | ICD-10-CM | POA: Diagnosis not present

## 2019-11-02 DIAGNOSIS — I359 Nonrheumatic aortic valve disorder, unspecified: Secondary | ICD-10-CM | POA: Diagnosis not present

## 2019-11-02 DIAGNOSIS — I130 Hypertensive heart and chronic kidney disease with heart failure and stage 1 through stage 4 chronic kidney disease, or unspecified chronic kidney disease: Secondary | ICD-10-CM | POA: Diagnosis not present

## 2019-11-02 DIAGNOSIS — J9811 Atelectasis: Secondary | ICD-10-CM | POA: Diagnosis not present

## 2019-11-02 DIAGNOSIS — I2510 Atherosclerotic heart disease of native coronary artery without angina pectoris: Secondary | ICD-10-CM | POA: Diagnosis not present

## 2019-11-02 DIAGNOSIS — R32 Unspecified urinary incontinence: Secondary | ICD-10-CM | POA: Diagnosis not present

## 2019-11-02 DIAGNOSIS — Z951 Presence of aortocoronary bypass graft: Secondary | ICD-10-CM | POA: Diagnosis not present

## 2019-11-02 DIAGNOSIS — J9621 Acute and chronic respiratory failure with hypoxia: Secondary | ICD-10-CM | POA: Diagnosis not present

## 2019-11-02 DIAGNOSIS — I5022 Chronic systolic (congestive) heart failure: Secondary | ICD-10-CM | POA: Diagnosis not present

## 2019-11-02 DIAGNOSIS — G319 Degenerative disease of nervous system, unspecified: Secondary | ICD-10-CM | POA: Diagnosis not present

## 2019-11-02 DIAGNOSIS — E1122 Type 2 diabetes mellitus with diabetic chronic kidney disease: Secondary | ICD-10-CM | POA: Diagnosis not present

## 2019-11-02 DIAGNOSIS — N179 Acute kidney failure, unspecified: Secondary | ICD-10-CM | POA: Diagnosis not present

## 2019-11-02 DIAGNOSIS — N1831 Chronic kidney disease, stage 3a: Secondary | ICD-10-CM | POA: Diagnosis not present

## 2019-11-02 DIAGNOSIS — I6782 Cerebral ischemia: Secondary | ICD-10-CM | POA: Diagnosis not present

## 2019-11-02 DIAGNOSIS — R918 Other nonspecific abnormal finding of lung field: Secondary | ICD-10-CM | POA: Diagnosis not present

## 2019-11-02 DIAGNOSIS — Z87891 Personal history of nicotine dependence: Secondary | ICD-10-CM | POA: Diagnosis not present

## 2019-11-02 DIAGNOSIS — I420 Dilated cardiomyopathy: Secondary | ICD-10-CM | POA: Diagnosis not present

## 2019-11-02 DIAGNOSIS — Z7901 Long term (current) use of anticoagulants: Secondary | ICD-10-CM | POA: Diagnosis not present

## 2019-11-02 LAB — POCT INR: INR: 2.4 (ref 2.0–3.0)

## 2019-11-02 NOTE — Patient Instructions (Signed)
Description   Spoke with Deena RN with [facility] home health, advised pt to continue taking same dosage 1/2 tablet daily except 1 tablet on Tuesdays and Saturdays.  Repeat INR in 2 weeks.  Coumadin Clinic [phone]

## 2019-11-03 DIAGNOSIS — J9621 Acute and chronic respiratory failure with hypoxia: Secondary | ICD-10-CM | POA: Diagnosis not present

## 2019-11-03 DIAGNOSIS — J1289 Other viral pneumonia: Secondary | ICD-10-CM | POA: Diagnosis not present

## 2019-11-03 DIAGNOSIS — I130 Hypertensive heart and chronic kidney disease with heart failure and stage 1 through stage 4 chronic kidney disease, or unspecified chronic kidney disease: Secondary | ICD-10-CM | POA: Diagnosis not present

## 2019-11-03 DIAGNOSIS — I2510 Atherosclerotic heart disease of native coronary artery without angina pectoris: Secondary | ICD-10-CM | POA: Diagnosis not present

## 2019-11-03 DIAGNOSIS — U071 COVID-19: Secondary | ICD-10-CM | POA: Diagnosis not present

## 2019-11-03 DIAGNOSIS — J439 Emphysema, unspecified: Secondary | ICD-10-CM | POA: Diagnosis not present

## 2019-11-06 ENCOUNTER — Ambulatory Visit: Payer: Medicare Other

## 2019-11-06 ENCOUNTER — Other Ambulatory Visit: Payer: Self-pay | Admitting: *Deleted

## 2019-11-06 ENCOUNTER — Encounter: Payer: Self-pay | Admitting: *Deleted

## 2019-11-06 NOTE — Patient Outreach (Signed)
Telephone outreach call for chronic disease management.   Talked with daughter who reports  Has caregiver [name], that is also in [place] with her mother for 14 days in the pt's home.  Pt has all meds some not recommended for the elderly: alprazolam, Tizinidine, Baclofen. [name] was recently discharged from hospital and all medications have been reviewed. Outpatient Encounter Medications as of 11/06/2019  Medication Sig  . acetaminophen (TYLENOL) 325 MG tablet Take 650 mg by mouth every 4 (four) hours as needed.  . ALPRAZolam (XANAX) 0.25 MG tablet Take 0.25 mg by mouth at bedtime as needed for anxiety.  Marland Kitchen atorvastatin (LIPITOR) 40 MG tablet Take 1 tablet (40 mg total) by mouth daily.  . baclofen (LIORESAL) 10 MG tablet Take 10 mg by mouth as needed for muscle spasms.  . carvedilol (COREG) 6.25 MG tablet Take 1 tablet (6.25 mg total) by mouth 2 (two) times daily with a meal. Keep scheduled appointment  . dexamethasone (DECADRON) 6 MG tablet Take 1 tablet (6 mg total) by mouth daily.  Marland Kitchen docusate sodium (COLACE) 100 MG capsule Take 100 mg by mouth at bedtime.   . donepezil (ARICEPT) 10 MG tablet TAKE ONE TABLET BY MOUTH ONCE DAILY.  . ergocalciferol (VITAMIN D2) 50000 UNITS capsule Take 50,000 Units by mouth every Tuesday.   . ferrous sulfate 325 (65 FE) MG EC tablet Take 325 mg by mouth 2 (two) times daily.  . furosemide (LASIX) 20 MG tablet TAKE ONE TABLET BY MOUTH ONCE DAILY.TAKE EXTRA IF NEEDED.  . hydrocortisone (ANUSOL-HC) 2.5 % rectal cream Place 1 application rectally 3 (three) times daily as needed for hemorrhoids or anal itching.  . levalbuterol (XOPENEX) 0.63 MG/3ML nebulizer solution Take 0.63 mg by nebulization every evening.  . meclizine (ANTIVERT) 25 MG tablet Take 25 mg by mouth 3 (three) times daily as needed for dizziness.  . Melatonin 3 MG TABS Take 1 tablet by mouth at bedtime.  . multivitamin-lutein (OCUVITE-LUTEIN) CAPS capsule Take 1 capsule by mouth daily.  .  pregabalin (LYRICA) 75 MG capsule Take 1 capsule (75 mg total) by mouth every other day.  . spironolactone (ALDACTONE) 25 MG tablet TAKE (1/2) TABLET BY MOUTH DAILY.  Marland Kitchen umeclidinium-vilanterol (ANORO ELLIPTA) 62.5-25 MCG/INH AEPB Inhale 1 puff into the lungs daily.   Marland Kitchen warfarin (COUMADIN) 2.5 MG tablet Take 1 tablet (2.5 mg total) by mouth every evening. DO NOT TAKE COUMADIN UNTIL YOUR INR IS DRAWN ON 12/17-AND YOU RECEIVE further instructions from Coumadin clinic.   No facility-administered encounter medications on file as of 11/06/2019.   THN CM Care Plan Problem One     Most Recent Value  Care Plan Problem One  Recent COVID19 infection and hospitalization, underlying COPD.  Role Documenting the Problem One  Care Management [place] for Problem One  Active  THN Long Term Goal   Pt will follow post COVID medical instructions, safety precautions and avoid hospitalization over the next 60 days.  THN Long Term Goal Start Date  11/06/19  Interventions for Problem One Long Term Goal  Assessed house hold safety measures in place: Pt is quaranteened with caregiver for 14 days.  THN CM Short Term Goal #1   Remain in quaranteen until 14 days from hospital discharge.  THN CM Short Term Goal #1 Start Date  11/06/19  Interventions for Short Term Goal #1  Ensured quaranteen is being followed.  THN CM Short Term Goal #2   Pt and family members will wear masks, wash hands  frequently and keep a distance of 6 feet between each other per report of pt daughter.  Interventions for Short Term Goal #2  Asked if family knows the 3 W's? Yes    THN CM Care Plan Problem Two     Most Recent Value  Care Plan Problem Two  HF  Role Documenting the Problem Two  Care Management Coordinator  Care Plan for Problem Two  Active  Interventions for Problem Two Long Term Goal   Inquired about HF self management.  THN Long Term Goal  Pt will not have HF exacerbation requiring hospitalizatio in the next 60 days.   THN Long Term Goal Start Date  11/06/19  THN CM Short Term Goal #1   Pt will weigh daily and report wt gain to MD or NP for instructions over the next 30 days.  THN CM Short Term Goal #1 Start Date  11/06/19  Interventions for Short Term Goal #2   Pt is weighing but not recording her wt.  THN CM Short Term Goal #2   Pt will follow a lo sodium diet per daughter report over the next 30 days.  THN CM Short Term Goal #2 Start Date  11/06/19    [facility] CM Care Plan Problem Three     Most Recent Value  Care Plan Problem Three  DM/CKD stage III  Role Documenting the Problem Three  Care Management Coordinator  Care Plan for Problem Three  Active  THN CM Short Term Goal #1   Pt will check her glucose level 3 times a week over the next 30 days.     Daughter is very familiar with [facility] and is glad that we will engage them again.  I will recheck in with them next week.  Zara Council. Burgess Estelle, MSN, [facility] Gerontological Nurse Practitioner [facility] Care Management [phone]

## 2019-11-07 DIAGNOSIS — I2510 Atherosclerotic heart disease of native coronary artery without angina pectoris: Secondary | ICD-10-CM | POA: Diagnosis not present

## 2019-11-07 DIAGNOSIS — U071 COVID-19: Secondary | ICD-10-CM | POA: Diagnosis not present

## 2019-11-07 DIAGNOSIS — J439 Emphysema, unspecified: Secondary | ICD-10-CM | POA: Diagnosis not present

## 2019-11-07 DIAGNOSIS — J9621 Acute and chronic respiratory failure with hypoxia: Secondary | ICD-10-CM | POA: Diagnosis not present

## 2019-11-07 DIAGNOSIS — J1289 Other viral pneumonia: Secondary | ICD-10-CM | POA: Diagnosis not present

## 2019-11-07 DIAGNOSIS — I130 Hypertensive heart and chronic kidney disease with heart failure and stage 1 through stage 4 chronic kidney disease, or unspecified chronic kidney disease: Secondary | ICD-10-CM | POA: Diagnosis not present

## 2019-11-08 DIAGNOSIS — I130 Hypertensive heart and chronic kidney disease with heart failure and stage 1 through stage 4 chronic kidney disease, or unspecified chronic kidney disease: Secondary | ICD-10-CM | POA: Diagnosis not present

## 2019-11-08 DIAGNOSIS — J1289 Other viral pneumonia: Secondary | ICD-10-CM | POA: Diagnosis not present

## 2019-11-08 DIAGNOSIS — J9621 Acute and chronic respiratory failure with hypoxia: Secondary | ICD-10-CM | POA: Diagnosis not present

## 2019-11-08 DIAGNOSIS — I2510 Atherosclerotic heart disease of native coronary artery without angina pectoris: Secondary | ICD-10-CM | POA: Diagnosis not present

## 2019-11-08 DIAGNOSIS — U071 COVID-19: Secondary | ICD-10-CM | POA: Diagnosis not present

## 2019-11-08 DIAGNOSIS — J439 Emphysema, unspecified: Secondary | ICD-10-CM | POA: Diagnosis not present

## 2019-11-12 DIAGNOSIS — I2510 Atherosclerotic heart disease of native coronary artery without angina pectoris: Secondary | ICD-10-CM | POA: Diagnosis not present

## 2019-11-12 DIAGNOSIS — I130 Hypertensive heart and chronic kidney disease with heart failure and stage 1 through stage 4 chronic kidney disease, or unspecified chronic kidney disease: Secondary | ICD-10-CM | POA: Diagnosis not present

## 2019-11-12 DIAGNOSIS — J439 Emphysema, unspecified: Secondary | ICD-10-CM | POA: Diagnosis not present

## 2019-11-12 DIAGNOSIS — J1289 Other viral pneumonia: Secondary | ICD-10-CM | POA: Diagnosis not present

## 2019-11-12 DIAGNOSIS — J9621 Acute and chronic respiratory failure with hypoxia: Secondary | ICD-10-CM | POA: Diagnosis not present

## 2019-11-12 DIAGNOSIS — U071 COVID-19: Secondary | ICD-10-CM | POA: Diagnosis not present

## 2019-11-13 ENCOUNTER — Other Ambulatory Visit: Payer: Self-pay | Admitting: Cardiology

## 2019-11-14 ENCOUNTER — Other Ambulatory Visit: Payer: Self-pay | Admitting: Cardiology

## 2019-11-14 DIAGNOSIS — J9621 Acute and chronic respiratory failure with hypoxia: Secondary | ICD-10-CM | POA: Diagnosis not present

## 2019-11-14 DIAGNOSIS — I130 Hypertensive heart and chronic kidney disease with heart failure and stage 1 through stage 4 chronic kidney disease, or unspecified chronic kidney disease: Secondary | ICD-10-CM | POA: Diagnosis not present

## 2019-11-14 DIAGNOSIS — I2510 Atherosclerotic heart disease of native coronary artery without angina pectoris: Secondary | ICD-10-CM | POA: Diagnosis not present

## 2019-11-14 DIAGNOSIS — J439 Emphysema, unspecified: Secondary | ICD-10-CM | POA: Diagnosis not present

## 2019-11-14 DIAGNOSIS — J1289 Other viral pneumonia: Secondary | ICD-10-CM | POA: Diagnosis not present

## 2019-11-14 DIAGNOSIS — U071 COVID-19: Secondary | ICD-10-CM | POA: Diagnosis not present

## 2019-11-15 ENCOUNTER — Ambulatory Visit (INDEPENDENT_AMBULATORY_CARE_PROVIDER_SITE_OTHER): Payer: Medicare Other | Admitting: Pharmacist

## 2019-11-15 DIAGNOSIS — I359 Nonrheumatic aortic valve disorder, unspecified: Secondary | ICD-10-CM

## 2019-11-15 DIAGNOSIS — J1289 Other viral pneumonia: Secondary | ICD-10-CM | POA: Diagnosis not present

## 2019-11-15 DIAGNOSIS — J439 Emphysema, unspecified: Secondary | ICD-10-CM | POA: Diagnosis not present

## 2019-11-15 DIAGNOSIS — U071 COVID-19: Secondary | ICD-10-CM | POA: Diagnosis not present

## 2019-11-15 DIAGNOSIS — I2510 Atherosclerotic heart disease of native coronary artery without angina pectoris: Secondary | ICD-10-CM | POA: Diagnosis not present

## 2019-11-15 DIAGNOSIS — J9621 Acute and chronic respiratory failure with hypoxia: Secondary | ICD-10-CM | POA: Diagnosis not present

## 2019-11-15 DIAGNOSIS — Z5181 Encounter for therapeutic drug level monitoring: Secondary | ICD-10-CM

## 2019-11-15 DIAGNOSIS — I130 Hypertensive heart and chronic kidney disease with heart failure and stage 1 through stage 4 chronic kidney disease, or unspecified chronic kidney disease: Secondary | ICD-10-CM | POA: Diagnosis not present

## 2019-11-15 DIAGNOSIS — Z952 Presence of prosthetic heart valve: Secondary | ICD-10-CM

## 2019-11-15 LAB — POCT INR: INR: 6.7 — AB (ref 2.0–3.0)

## 2019-11-15 LAB — PROTIME-INR: INR: 6.2 — AB (ref 0.9–1.1)

## 2019-11-15 NOTE — Telephone Encounter (Signed)
Rx request sent to pharmacy.  

## 2019-11-19 ENCOUNTER — Encounter: Payer: Self-pay | Admitting: Family Medicine

## 2019-11-19 ENCOUNTER — Other Ambulatory Visit: Payer: Self-pay

## 2019-11-19 ENCOUNTER — Ambulatory Visit (INDEPENDENT_AMBULATORY_CARE_PROVIDER_SITE_OTHER): Payer: Medicare Other | Admitting: Family Medicine

## 2019-11-19 ENCOUNTER — Ambulatory Visit (INDEPENDENT_AMBULATORY_CARE_PROVIDER_SITE_OTHER): Payer: Medicare Other | Admitting: Pharmacist

## 2019-11-19 VITALS — BP 119/60 | HR 60 | Temp 97.5°F | Ht 62.5 in | Wt 114.4 lb

## 2019-11-19 DIAGNOSIS — N179 Acute kidney failure, unspecified: Secondary | ICD-10-CM

## 2019-11-19 DIAGNOSIS — I6529 Occlusion and stenosis of unspecified carotid artery: Secondary | ICD-10-CM | POA: Diagnosis not present

## 2019-11-19 DIAGNOSIS — N1831 Chronic kidney disease, stage 3a: Secondary | ICD-10-CM | POA: Diagnosis not present

## 2019-11-19 DIAGNOSIS — R42 Dizziness and giddiness: Secondary | ICD-10-CM

## 2019-11-19 DIAGNOSIS — E7800 Pure hypercholesterolemia, unspecified: Secondary | ICD-10-CM

## 2019-11-19 DIAGNOSIS — Z5181 Encounter for therapeutic drug level monitoring: Secondary | ICD-10-CM | POA: Diagnosis not present

## 2019-11-19 DIAGNOSIS — I359 Nonrheumatic aortic valve disorder, unspecified: Secondary | ICD-10-CM

## 2019-11-19 DIAGNOSIS — N183 Chronic kidney disease, stage 3 unspecified: Secondary | ICD-10-CM

## 2019-11-19 DIAGNOSIS — G4700 Insomnia, unspecified: Secondary | ICD-10-CM

## 2019-11-19 DIAGNOSIS — D508 Other iron deficiency anemias: Secondary | ICD-10-CM

## 2019-11-19 DIAGNOSIS — I10 Essential (primary) hypertension: Secondary | ICD-10-CM

## 2019-11-19 DIAGNOSIS — R7401 Elevation of levels of liver transaminase levels: Secondary | ICD-10-CM

## 2019-11-19 DIAGNOSIS — Z952 Presence of prosthetic heart valve: Secondary | ICD-10-CM | POA: Diagnosis not present

## 2019-11-19 DIAGNOSIS — R2242 Localized swelling, mass and lump, left lower limb: Secondary | ICD-10-CM

## 2019-11-19 DIAGNOSIS — Z9981 Dependence on supplemental oxygen: Secondary | ICD-10-CM

## 2019-11-19 DIAGNOSIS — J439 Emphysema, unspecified: Secondary | ICD-10-CM | POA: Diagnosis not present

## 2019-11-19 DIAGNOSIS — I5042 Chronic combined systolic (congestive) and diastolic (congestive) heart failure: Secondary | ICD-10-CM | POA: Diagnosis not present

## 2019-11-19 DIAGNOSIS — Z7901 Long term (current) use of anticoagulants: Secondary | ICD-10-CM

## 2019-11-19 DIAGNOSIS — R778 Other specified abnormalities of plasma proteins: Secondary | ICD-10-CM | POA: Diagnosis not present

## 2019-11-19 DIAGNOSIS — K649 Unspecified hemorrhoids: Secondary | ICD-10-CM

## 2019-11-19 DIAGNOSIS — I2510 Atherosclerotic heart disease of native coronary artery without angina pectoris: Secondary | ICD-10-CM | POA: Diagnosis not present

## 2019-11-19 DIAGNOSIS — J449 Chronic obstructive pulmonary disease, unspecified: Secondary | ICD-10-CM

## 2019-11-19 DIAGNOSIS — I420 Dilated cardiomyopathy: Secondary | ICD-10-CM

## 2019-11-19 DIAGNOSIS — U071 COVID-19: Secondary | ICD-10-CM | POA: Diagnosis not present

## 2019-11-19 DIAGNOSIS — Z951 Presence of aortocoronary bypass graft: Secondary | ICD-10-CM

## 2019-11-19 DIAGNOSIS — J9621 Acute and chronic respiratory failure with hypoxia: Secondary | ICD-10-CM | POA: Diagnosis not present

## 2019-11-19 DIAGNOSIS — I509 Heart failure, unspecified: Secondary | ICD-10-CM

## 2019-11-19 DIAGNOSIS — I130 Hypertensive heart and chronic kidney disease with heart failure and stage 1 through stage 4 chronic kidney disease, or unspecified chronic kidney disease: Secondary | ICD-10-CM

## 2019-11-19 DIAGNOSIS — F0390 Unspecified dementia without behavioral disturbance: Secondary | ICD-10-CM

## 2019-11-19 DIAGNOSIS — J1289 Other viral pneumonia: Secondary | ICD-10-CM | POA: Diagnosis not present

## 2019-11-19 DIAGNOSIS — R112 Nausea with vomiting, unspecified: Secondary | ICD-10-CM

## 2019-11-19 DIAGNOSIS — J1282 Pneumonia due to coronavirus disease 2019: Secondary | ICD-10-CM

## 2019-11-19 DIAGNOSIS — E559 Vitamin D deficiency, unspecified: Secondary | ICD-10-CM

## 2019-11-19 LAB — POCT INR: INR: 1.3 — AB (ref 2.0–3.0)

## 2019-11-19 MED ORDER — TRAZODONE HCL 50 MG PO TABS: 25.0000 mg | ORAL_TABLET | Freq: Every evening | ORAL | 3 refills | Status: DC | PRN

## 2019-11-19 NOTE — Progress Notes (Signed)
New Patient Office Visit  Subjective:  Patient ID: [name], female    DOB: February 26, 1926  Age: 86 y.o. MRN: [id number]  CC:  Chief Complaint  Patient presents with  . Establish Care  . Foot Swelling    left foot  . Follow-up    chest x-ray f/u 4 wks after discharge   Fill Date ID   Written Drug Qty Days Prescriber Rx # Pharmacy Refill   Daily Dose* Pymt Type PMP    11/13/2019  1   10/14/2019  Pregabalin 75 MG Capsule  15.00  30 Ed Haw   [id number]   [place] (0197)   0  0.25 LME  Comm Ins   [place]  10/23/2019  1   05/27/2019  Alprazolam 0.25 MG Tablet  60.00  30 Ed Haw   [id number]   [place] [phone])   4  1.00 LME  Comm Ins   [place]  10/13/2019  1   05/09/2019  Pregabalin 75 MG Capsule  15.00  30 Ed Haw   [id number]   [place] [phone])   5  0.25 LME  Comm Ins   [place]  09/17/2019  1   05/27/2019  Alprazolam 0.25 MG Tablet  60.00  30 Ed Haw   [id number]   [place] [phone])   3  1.00 LME  Comm Ins   [place]  09/10/2019  1   05/09/2019  Pregabalin 75 MG Capsule  15.00  30 Ed Haw   [id number]   [place] [phone])   4  0.25 LME  Comm Ins   [place]  08/14/2019  1   05/09/2019  Pregabalin 75 MG Capsule  15.00  30 Ed Haw   [id number]   [place] [phone])   3  0.25 LME  Comm Ins   [place]  08/13/2019  1   02/15/2019  Tramadol Hcl 50 MG Tablet  60.00  15 Ed Haw   [id number]   [place] ([id number])   5  20.00 MME  Comm Ins   [place]  08/08/2019  1   05/27/2019  Alprazolam 0.25 MG Tablet  60.00  30 Ed Haw   [id number]   [place] [phone])   2  1.00 LME  Comm Ins   [place]  07/13/2019  1   05/09/2019  Pregabalin 75 MG Capsule  15.00  30 Ed Haw   [id number]   [place] [phone])   2  0.25 LME  Comm Ins   [place]  07/05/2019  1   02/15/2019  Tramadol Hcl 50 MG Tablet  60.00  15 Ed Haw   [id number]   [place] (0197)   4  20.00 MME  Comm Ins   [place]  07/05/2019  1   05/27/2019  Alprazolam 0.25 MG Tablet  60.00  30 Ed Haw   [id number]   [place] [phone])   1  1.00 LME  Comm Ins   [place]  06/08/2019  1   05/09/2019  Pregabalin 75 MG Capsule  15.00  30 Ed Haw   [id number]   [place] [phone])   1  0.25 LME  Comm Ins   [place]  05/28/2019  1   05/27/2019  Alprazolam  0.25 MG Tablet  60.00  30 Ed Haw   [id number]   [place] (0197)   0  1.00 LME  Comm Ins   [place]  05/26/2019  1   02/15/2019  Tramadol Hcl 50 MG Tablet  60.00  15 Ed Haw   [id number]   [place] (0197)   3  20.00  MME  Comm Ins   [place]  05/09/2019  1   05/09/2019  Pregabalin 75 MG Capsule  15.00  30 Ed Haw   [id number]   [place] [phone])   0  0.25 LME  Comm Ins   [place]  05/03/2019  1   02/15/2019  Tramadol Hcl 50 MG Tablet  60.00  15 Ed Haw   [id number]   [place] [phone])   2  20.00 MME  Comm Ins   [place]  04/10/2019  1   10/23/2018  Pregabalin 75 MG Capsule  15.00  30 Ed Haw   [id number]   [place] [phone])   5  0.25 LME  Comm Ins   [place]  03/30/2019  1   02/15/2019  Tramadol Hcl 50 MG Tablet  60.00  15 Ed Haw   [id number]   [place] [phone])   1  20.00 MME  Comm Ins   [place]  03/28/2019  1   10/05/2018  Alprazolam 0.25 MG Tablet              HPI [name] presents for chest wall pain with positional change. No associated SOB. No fever. Pt states noted after hospital admission-COVID + Follow up cxr was requested . Follow up labwork requested after discharge-CBC, CMP Brief Summary:  Patient is Colleen Simpsonfemalewith PMHx of chronic systolic heart failure, CAD s/p CABG, mechanical aortic valve on anticoagulation, COPD on 3 L of oxygen at home-presented with shortness of breath-found to have acute hypoxic respiratory failure in the setting of COVID-19 pneumonia and decompensated systolic heart failure Brief Hospital Course: Acute on chronic hypoxic Resp Failure due to Covid 19 Viral pneumonia and decompensated systolic heart failure (EF 25-30% March 2020):Stable-on just 2 L of oxygen.  Treated with Lasix along with steroids and remdesivir, inflammatory markers are slowly downtrending. Volume status is stable-on oral diuretic regimen.  Patient will complete remdesivir on 12/15-she will be discharged on a few days of tapering steroids.    COVID-19 Labs:  Recent Labs (last 2 labs)        Recent Labs    10/28/19 0505 10/29/19 0326 10/30/19 0428  DDIMER  1.48* 0.75* 0.84*  FERRITIN 1,999* 1,131* 769*  CRP 8.4* 5.5* 3.8*      Recent Labs       Lab Results  Component Value Date   SARSCOV2NAA DETECTED (A) 10/20/2019      COVID-19 Medications: Steroids: 12/11>>12/15 Remdesivir: 12/10>> Other medications: Diuretics:Euvolemic, negative balance of 2.3 L-on oral diuretic regimen Antibiotics:Not needed as no evidence of bacterial infection Insulin: CBG stable with SSI. A1c 5.7 on 12/12. nausea/Vomiting:Probably due to COVID-19-continue supportive care-as needed antiemetics. Elevated troponin: Secondary demand ischemia-prior hospitalist Dr. Luz Brazen with cardiology-recommendations are for conservative management. Transaminitis: Appears mild-secondary to COVID-19-has resolved by the time of discharge. AKIon CKD stage IIIa:AKI improved-continue to monitor closely while on diuretics. COPD-on 3 L of oxygen at home:No signs of exacerbation-continue bronchodilators CAD s/p CABG:No anginal symptoms-see above regarding troponins History of aortic valve replacement-mechanical valve: On Coumadin-INR supratherapeutic despite of holding Coumadin for several days-managed by pharmacy.  Spoke with pharmacy team-continue to hold Coumadin on discharge until INR is checked in 2 days.-Case management will arrange for INR check by home health RN on 12/17 subsequently dosing of Coumadin will be adjusted by the Coumadin clinic. Dementia:Appears to be mild-continue Aricept  Left foot swelling intermittently-inflammatory arthritis in the past with steroids -left foot swollen for last few days-improved today, redness and tenderness to palpation. No injury.  No h/o  RA  Past Medical History:  Diagnosis Date  . Anemia   . Anxiety   . Arthritis   . Blood transfusion without reported diagnosis   . Carotid artery stenosis    Without infarction  . Cataract   . Chronic combined systolic (congestive) and diastolic (congestive) heart failure  (HCC)   . COPD (chronic obstructive pulmonary disease) (HCC)   . Coronary artery disease   . Heart valve replaced by other means   . Hypercholesterolemia    Pure  . Hypertension    Unspecified  . LBBB (left bundle branch block)   . Macular degeneration (senile) of retina, unspecified   . On home O2   . Postsurgical aortocoronary bypass status   . Stroke (HCC)   . Transient global amnesia   . Unspecified hereditary and idiopathic peripheral neuropathy   . Unspecified vitamin D deficiency     Past Surgical History:  Procedure Laterality Date  . AORTIC VALVE REPLACEMENT    . APPENDECTOMY    . CORONARY ARTERY BYPASS GRAFT    . HIP PINNING,CANNULATED Right 07/18/2018   Procedure: RIGHT CANNULATED HIP PINNING;  Surgeon: Myrene Galas, MD;  Location: [facility] OR;  Service: Orthopedics;  Laterality: Right;  . TONSILLECTOMY      Family History  Problem Relation Age of Onset  . Heart attack Father   . Heart disease Father   . Stroke Father   . Stroke Sister   . Hypertension Mother   . Stroke Mother   . Neuropathy Brother   . COPD Sister     Social History   Socioeconomic History  . Marital status: Widowed    Spouse name: Not on file  . Number of children: 2  . Years of education: 12th  . Highest education level: Not on file  Occupational History    Employer: RETIRED  Tobacco Use  . Smoking status: Former Smoker    Packs/day: 0.50    Types: Cigarettes    Quit date: 11/30/1991    Years since quitting: 27.9  . Smokeless tobacco: Never Used  . Tobacco comment: Tobacco use-no  Substance and Sexual Activity  . Alcohol use: No  . Drug use: No  . Sexual activity: Never  Other Topics Concern  . Not on file  Social History Narrative   Pt lives at home alone.   Caffeine Use: very rarely   Social Determinants of Corporate investment banker Strain:   . Difficulty of Paying Living Expenses: Not on file  Food Insecurity:   . Worried About Programme researcher, broadcasting/film/video in the Last Year:  Not on file  . Ran Out of Food in the Last Year: Not on file  Transportation Needs:   . Lack of Transportation (Medical): Not on file  . Lack of Transportation (Non-Medical): Not on file  Physical Activity:   . Days of Exercise per Week: Not on file  . Minutes of Exercise per Session: Not on file  Stress:   . Feeling of Stress : Not on file  Social Connections:   . Frequency of Communication with Friends and Family: Not on file  . Frequency of Social Gatherings with Friends and Family: Not on file  . Attends Religious Services: Not on file  . Active Member of Clubs or Organizations: Not on file  . Attends Banker Meetings: Not on file  . Marital Status: Not on file  Intimate Partner Violence:   . Fear of Current or Ex-Partner: Not on file  .  Emotionally Abused: Not on file  . Physically Abused: Not on file  . Sexually Abused: Not on file    ROS Review of Systems  Constitutional: Positive for activity change. Negative for appetite change and fatigue.  HENT: Positive for hearing loss.   Eyes:       Macular degeneration  Respiratory: Positive for shortness of breath.   Cardiovascular: Positive for palpitations and leg swelling.  Gastrointestinal: Positive for anal bleeding and diarrhea.       Hemorrhoids  Musculoskeletal: Positive for arthralgias, back pain and neck pain.  Skin:       Swelling lower left leg  Neurological: Positive for numbness.       Foot numbness  Hematological: Bruises/bleeds easily.  Psychiatric/Behavioral: Positive for confusion.    Objective:   Today's Vitals: BP 119/60 (BP Location: Left Arm, Patient Position: Sitting, Cuff Size: Normal)   Pulse 60   Temp (!) 97.5 F (36.4 C) (Oral)   Ht 5' 2.5" (1.588 m)   Wt 114 lb 6.4 oz (51.9 kg)   SpO2 98%   BMI 20.59 kg/m   Physical Exam Vitals reviewed.  Constitutional:      Appearance: Normal appearance.  HENT:     Head: Normocephalic and atraumatic.  Eyes:     Conjunctiva/sclera:  Conjunctivae normal.  Cardiovascular:     Rate and Rhythm: Normal rate and regular rhythm.     Pulses: Normal pulses.     Heart sounds: Normal heart sounds.  Musculoskeletal:     Cervical back: Normal range of motion.     Left lower leg: Edema present.  Skin:    Findings: Erythema present.  Neurological:     Mental Status: She is alert and oriented to person, place, and time.  Psychiatric:        Mood and Affect: Mood normal.        Behavior: Behavior normal.     Assessment & Plan:   Problem List Items Addressed This Visit      Cardiovascular and Mediastinum   Essential hypertension   Relevant Orders   Lipid panel   Carotid artery stenosis   Cerebral artery occlusion with cerebral infarction [facility])   Aortic valve disorder   Congestive dilated cardiomyopathy (HCC)   CHF (congestive heart failure) (HCC) - Primary   Relevant Orders   DG Chest 2 View   Ambulatory referral to Pulmonology   Chronic combined systolic and diastolic CHF (congestive heart failure) (HCC)     Respiratory   COPD (chronic obstructive pulmonary disease) (HCC)   Pneumonia due to COVID-19 virus     Nervous and Auditory   Dementia (HCC)   Relevant Medications   traZODone (DESYREL) 50 MG tablet     Genitourinary   CKD (chronic kidney disease) stage 3, GFR 30-59 ml/min     Other   Hyperlipidemia   Anemia   Vitamin D deficiency   Relevant Orders   VITAMIN D 25 Hydroxy (Vit-D Deficiency, Fractures)      Outpatient Encounter Medications as of 11/19/2019  Medication Sig  . acetaminophen (TYLENOL) 325 MG tablet Take 650 mg by mouth every 4 (four) hours as needed.  Marland Kitchen atorvastatin (LIPITOR) 40 MG tablet Take 1 tablet (40 mg total) by mouth daily.  . baclofen (LIORESAL) 10 MG tablet Take 10 mg by mouth as needed for muscle spasms.  . carvedilol (COREG) 6.25 MG tablet TAKE (1) TABLET BY MOUTH TWICE DAILY.  Marland Kitchen dexamethasone (DECADRON) 6 MG tablet Take 1 tablet (6 mg  total) by mouth daily.  Marland Kitchen. docusate  sodium (COLACE) 100 MG capsule Take 100 mg by mouth at bedtime.   . donepezil (ARICEPT) 10 MG tablet TAKE ONE TABLET BY MOUTH ONCE DAILY.  . ergocalciferol (VITAMIN D2) 50000 UNITS capsule Take 50,000 Units by mouth every Tuesday.   . ferrous sulfate 325 (65 FE) MG EC tablet Take 325 mg by mouth 2 (two) times daily.  . furosemide (LASIX) 20 MG tablet TAKE ONE TABLET BY MOUTH ONCE DAILY.TAKE EXTRA IF NEEDED.  . hydrocortisone (ANUSOL-HC) 2.5 % rectal cream Place 1 application rectally 3 (three) times daily as needed for hemorrhoids or anal itching.  . levalbuterol (XOPENEX) 0.63 MG/3ML nebulizer solution Take 0.63 mg by nebulization every evening.  . meclizine (ANTIVERT) 25 MG tablet Take 25 mg by mouth 3 (three) times daily as needed for dizziness.  . Melatonin 3 MG TABS Take 1 tablet by mouth at bedtime.  . multivitamin-lutein (OCUVITE-LUTEIN) CAPS capsule Take 1 capsule by mouth daily.  . pregabalin (LYRICA) 75 MG capsule Take 1 capsule (75 mg total) by mouth every other day.  . spironolactone (ALDACTONE) 25 MG tablet TAKE (1/2) TABLET BY MOUTH DAILY.  . traZODone (DESYREL) 50 MG tablet Take 0.5-1 tablets (25-50 mg total) by mouth at bedtime as needed for sleep.  Marland Kitchen. umeclidinium-vilanterol (ANORO ELLIPTA) 62.5-25 MCG/INH AEPB Inhale 1 puff into the lungs daily.   Marland Kitchen. warfarin (COUMADIN) 2.5 MG tablet Take 1 tablet (2.5 mg total) by mouth every evening. DO NOT TAKE COUMADIN UNTIL YOUR INR IS DRAWN ON 12/17-AND YOU RECEIVE further instructions from Coumadin clinic.  . [DISCONTINUED] ALPRAZolam (XANAX) 0.25 MG tablet Take 0.25 mg by mouth at bedtime as needed for anxiety.   No facility-administered encounter medications on file as of 11/19/2019.  1. Other congestive heart failure (HCC) Take additional dose of lasix today and tomorrow-complete labwork-CBC, CMP - DG Chest 2 View; Future - Ambulatory referral to Pulmonology Anoro, oxygen, xopenex at night to improve sleep 2. Aortic valve  disorder Continue f/u with cardiology  3. Congestive dilated cardiomyopathy (HCC) Continue follow up with cardiology-spironolactone/lasix  4. Chronic combined systolic and diastolic CHF (congestive heart failure) (HCC) INR followed by cardiology-elevated with coumadin held-recheck today  5. Stenosis of carotid artery, unspecified laterality Followed by vascular 6. Essential hypertension Coreg-stable - Lipid panel 7. Pneumonia due to COVID-19 virus Recheck -cxr-pt with chest wall pain-left side-tenderness to palpation  8. Dementia without behavioral disturbance, unspecified dementia type (HCC) aricept-stable-MMS next appt  9. Stage 3 chronic kidney disease, unspecified whether stage 3a or 3b CKD CMP pending-reviewed labwork with pt and daughter-increase in Cr during hospitalization  10. Vitamin D deficiency Pt with weekly doses-needs level-no recent DEXA - VITAMIN D 25 Hydroxy (Vit-D Deficiency, Fractures)   11.Pure hypercholesterolemia Atorvastatin-stable-needs baseline lipid panel  14. Other iron deficiency anemia Cbc -taking iron daily-reviewed labwork-Dr. Otho Darnerhawlkins 15.NSOMINIA I-D/W PT will not recommend xanax for sleep-consider trazodone 25 mg nightly-pt to use melatonin earlier in the evening to improve falling asleep 16. Foot swelling-elevation, extra dose of lasix today and tomorrow  Follow-up: Return in about 1 week (around 11/26/2019).  1 hour in assessment of records, history using old records, daughter and patient information, exam and discussion about assessment and plan Anwita Mencer Mat CarneLEIGH Delbert Darley, MD

## 2019-11-19 NOTE — Patient Instructions (Addendum)
Vit D level Lipid panel Chest xray- Edema  Edema is when you have too much fluid in your body or under your skin. Edema may make your legs, feet, and ankles swell up. Swelling is also common in looser tissues, like around your eyes. This is a common condition. It gets more common as you get older. There are many possible causes of edema. Eating too much salt (sodium) and being on your feet or sitting for a long time can cause edema in your legs, feet, and ankles. Hot weather may make edema worse. Edema is usually painless. Your skin may look swollen or shiny. Follow these instructions at home:  Keep the swollen body part raised (elevated) above the level of your heart when you are sitting or lying down.  Do not sit still or stand for a long time.  Do not wear tight clothes. Do not wear garters on your upper legs.  Exercise your legs. This can help the swelling go down.  Wear elastic bandages or support stockings as told by your doctor.  Eat a low-salt (low-sodium) diet to reduce fluid as told by your doctor.  Depending on the cause of your swelling, you may need to limit how much fluid you drink (fluid restriction).  Take over-the-counter and prescription medicines only as told by your doctor. Contact a doctor if:  Treatment is not working.  You have heart, liver, or kidney disease and have symptoms of edema.  You have sudden and unexplained weight gain. Get help right away if:  You have shortness of breath or chest pain.  You cannot breathe when you lie down.  You have pain, redness, or warmth in the swollen areas.  You have heart, liver, or kidney disease and get edema all of a sudden.  You have a fever and your symptoms get worse all of a sudden. Summary  Edema is when you have too much fluid in your body or under your skin.  Edema may make your legs, feet, and ankles swell up. Swelling is also common in looser tissues, like around your eyes.  Raise (elevate) the  swollen body part above the level of your heart when you are sitting or lying down.  Follow your doctor's instructions about diet and how much fluid you can drink (fluid restriction). This information is not intended to replace advice given to you by your health care provider. Make sure you discuss any questions you have with your health care provider. Document Revised: 11/04/2017 Document Reviewed: 11/19/2016 Elsevier Patient Education  2020 Elsevier Inc.  Preventing Heart Failure Heart failure is a condition in which the heart has trouble pumping blood because it has become weak or stiff. This may mean that the heart cannot pump enough blood out to the body or that the heart does not fill up with enough blood. Either of those problems can lead to symptoms such as tiredness (fatigue), trouble breathing, and swelling throughout the body. This is a common medical condition that affects not only the heart, but the entire body. Making certain nutrition and lifestyle changes can help you prevent heart failure and avoid serious health problems. How can this condition affect me? Heart failure can cause very serious problems that may get worse over time, such as:  Extreme fatigue during normal physical activities.  Shortness of breath or trouble breathing.  Swelling in your abdomen, legs, ankles, feet, or neck.  Fluid buildup throughout the body.  Weight gain.  Cough.  Frequent urination. What can increase  my risk? The risk of heart failure increases as a person ages. The following factors may also make you more likely to develop this condition:  Being overweight.  Being female.  Smoking or chewing tobacco.  Abusing alcohol or drugs.  Having taken medicines that can damage the heart, such as chemotherapy drugs.  Having any of these medical conditions: ? Diabetes. ? Abnormal heart rhythms. ? Thyroid problems. ? Low blood counts (anemia). What actions can I take to prevent heart  failure?     Nutrition  If you are overweight or obese, reduce how many calories you eat each day so that you lose weight. Work with your health care provider or a diet and nutrition specialist (dietitian) to determine how many calories you need each day.  Eat foods that are low in salt (sodium). Avoid adding extra salt to foods. This can help keep your blood pressure in a normal range.  Eat a well-balanced diet that includes a lot of: ? Fresh fruits and vegetables. ? Whole grains. ? Lean meats. ? Beans. ? Fat-free or low-fat dairy products.  Avoid foods that contain a lot of: ? Trans fats. ? Saturated fats. ? Sugar. ? Cholesterol. Alcohol  Do not drink alcohol if: ? Your health care provider tells you not to drink. ? You are pregnant, may be pregnant, or are planning to become pregnant.  If you drink alcohol: ? Limit how much you use to:  0-1 drink a day for women.  0-2 drinks a day for men. ? Be aware of how much alcohol is in your drink. In the [place] one drink equals one 12 oz bottle of beer (355 mL), one 5 oz glass of wine (148 mL), or one 1 oz glass of hard liquor (44 mL). Lifestyle  Do not use any products that contain nicotine or tobacco, such as cigarettes, e-cigarettes, and chewing tobacco. If you need help quitting, ask your health care provider.  Exercise for at least 150 minutes each week, or as much as told by your health care provider. ? Do moderate-intensity exercise, such as brisk walking, bicycling, or water aerobics. ? Ask your health care provider which activities are safe for you.  Try to get 7-9 hours of sleep each night. To help with sleep: ? Keep your bedroom cool and dark. ? Do not eat a heavy meal during the hour before you go to bed. ? Do not drink alcohol or caffeinated drinks before bed. ? Avoid screen time before bedtime. This means avoiding television, computers, tablets, and mobile phones.  Find ways to relax and manage stress. These  may include: ? Breathing exercises. ? Meditation. ? Yoga. ? Listening to music. General instructions  See a health care provider regularly for screening and wellness checks. Work with your health care provider to manage your: ? Blood pressure. ? Cholesterol levels. ? Blood sugar (glucose) levels. ? Weight and BMI.  If you have diabetes, manage your condition and follow your treatment plan as instructed. Where to find more information  National Heart, Lung, and Blood Institute: PopSteam.is  Centers for Disease Control and Prevention: FootballExhibition.com.br  [employer] on Aging: https://walker.com/  American Heart Association: www.heart.org Contact a health care provider if:  You have rapid weight gain.  You have increasing shortness of breath that is unusual for you.  You tire easily or you are unable to participate in your usual activities.  You cough more than normal, especially with physical activity.  You have any swelling or more  swelling in areas such as your hands, feet, ankles, or abdomen. Get help right away if you have:  Trouble breathing.  Pain or discomfort in your chest.  An episode of fainting. These symptoms may represent a serious problem that is an emergency. Do not wait to see if the symptoms will go away. Get medical help right away. Call your local emergency services (911 in the [place] Do not drive yourself to the hospital. Summary  Heart failure can cause very serious problems over time.  Heart failure can be prevented by making changes to your diet and your lifestyle.  It is important to eat a healthy diet, manage your weight, exercise regularly, manage stress, avoid drugs and alcohol, and keep your cholesterol and blood pressure under control. This information is not intended to replace advice given to you by your health care provider. Make sure you discuss any questions you have with your health care provider. Document Revised: 06/01/2019  Document Reviewed: 06/01/2019 Elsevier Patient Education  [place].  Take extra Lasix today and tomorrow Elevated legs Start prednisone 4mg  twice a day x 5 days

## 2019-11-20 ENCOUNTER — Ambulatory Visit (HOSPITAL_COMMUNITY)
Admission: RE | Admit: 2019-11-20 | Discharge: 2019-11-20 | Disposition: A | Discharge status: Home / Self Care | Payer: Medicare Other | Source: Ambulatory Visit | Attending: Family Medicine | Admitting: Family Medicine

## 2019-11-20 DIAGNOSIS — I509 Heart failure, unspecified: Secondary | ICD-10-CM | POA: Insufficient documentation

## 2019-11-20 DIAGNOSIS — R0602 Shortness of breath: Secondary | ICD-10-CM | POA: Diagnosis not present

## 2019-11-21 DIAGNOSIS — U071 COVID-19: Secondary | ICD-10-CM | POA: Diagnosis not present

## 2019-11-21 DIAGNOSIS — I130 Hypertensive heart and chronic kidney disease with heart failure and stage 1 through stage 4 chronic kidney disease, or unspecified chronic kidney disease: Secondary | ICD-10-CM | POA: Diagnosis not present

## 2019-11-21 DIAGNOSIS — J1289 Other viral pneumonia: Secondary | ICD-10-CM | POA: Diagnosis not present

## 2019-11-21 DIAGNOSIS — J9621 Acute and chronic respiratory failure with hypoxia: Secondary | ICD-10-CM | POA: Diagnosis not present

## 2019-11-21 DIAGNOSIS — J439 Emphysema, unspecified: Secondary | ICD-10-CM | POA: Diagnosis not present

## 2019-11-21 DIAGNOSIS — I2510 Atherosclerotic heart disease of native coronary artery without angina pectoris: Secondary | ICD-10-CM | POA: Diagnosis not present

## 2019-11-22 ENCOUNTER — Ambulatory Visit (INDEPENDENT_AMBULATORY_CARE_PROVIDER_SITE_OTHER): Payer: Medicare Other | Admitting: *Deleted

## 2019-11-22 DIAGNOSIS — I5022 Chronic systolic (congestive) heart failure: Secondary | ICD-10-CM | POA: Diagnosis not present

## 2019-11-22 DIAGNOSIS — J439 Emphysema, unspecified: Secondary | ICD-10-CM | POA: Diagnosis not present

## 2019-11-22 DIAGNOSIS — Z5181 Encounter for therapeutic drug level monitoring: Secondary | ICD-10-CM

## 2019-11-22 DIAGNOSIS — Z952 Presence of prosthetic heart valve: Secondary | ICD-10-CM

## 2019-11-22 DIAGNOSIS — I130 Hypertensive heart and chronic kidney disease with heart failure and stage 1 through stage 4 chronic kidney disease, or unspecified chronic kidney disease: Secondary | ICD-10-CM | POA: Diagnosis not present

## 2019-11-22 DIAGNOSIS — J1289 Other viral pneumonia: Secondary | ICD-10-CM | POA: Diagnosis not present

## 2019-11-22 DIAGNOSIS — I6350 Cerebral infarction due to unspecified occlusion or stenosis of unspecified cerebral artery: Secondary | ICD-10-CM | POA: Diagnosis not present

## 2019-11-22 DIAGNOSIS — I359 Nonrheumatic aortic valve disorder, unspecified: Secondary | ICD-10-CM

## 2019-11-22 DIAGNOSIS — N1831 Chronic kidney disease, stage 3a: Secondary | ICD-10-CM | POA: Diagnosis not present

## 2019-11-22 DIAGNOSIS — J9621 Acute and chronic respiratory failure with hypoxia: Secondary | ICD-10-CM | POA: Diagnosis not present

## 2019-11-22 DIAGNOSIS — I2510 Atherosclerotic heart disease of native coronary artery without angina pectoris: Secondary | ICD-10-CM | POA: Diagnosis not present

## 2019-11-22 DIAGNOSIS — E1122 Type 2 diabetes mellitus with diabetic chronic kidney disease: Secondary | ICD-10-CM | POA: Diagnosis not present

## 2019-11-22 DIAGNOSIS — U071 COVID-19: Secondary | ICD-10-CM | POA: Diagnosis not present

## 2019-11-22 LAB — POCT INR: INR: 2.2 (ref 2.0–3.0)

## 2019-11-22 NOTE — Patient Instructions (Signed)
Description    Called and spoke to [place] from [facility], instructed for pt to continue to take 1/2 tablet daily except 1 tablet on Tuesdays and Saturdays.  Repeat INR in 1 week.  Coumadin Clinic [phone]

## 2019-11-23 DIAGNOSIS — I2510 Atherosclerotic heart disease of native coronary artery without angina pectoris: Secondary | ICD-10-CM | POA: Diagnosis not present

## 2019-11-23 DIAGNOSIS — J1289 Other viral pneumonia: Secondary | ICD-10-CM | POA: Diagnosis not present

## 2019-11-23 DIAGNOSIS — J9621 Acute and chronic respiratory failure with hypoxia: Secondary | ICD-10-CM | POA: Diagnosis not present

## 2019-11-23 DIAGNOSIS — U071 COVID-19: Secondary | ICD-10-CM | POA: Diagnosis not present

## 2019-11-23 DIAGNOSIS — J439 Emphysema, unspecified: Secondary | ICD-10-CM | POA: Diagnosis not present

## 2019-11-23 DIAGNOSIS — I130 Hypertensive heart and chronic kidney disease with heart failure and stage 1 through stage 4 chronic kidney disease, or unspecified chronic kidney disease: Secondary | ICD-10-CM | POA: Diagnosis not present

## 2019-11-23 LAB — LIPID PANEL
Cholesterol, Total: 141
HDL Cholesterol: 55 (ref 35–70)
LDL Cholesterol: 73
Triglycerides: 66 (ref 40–160)
VLDL Cholesterol Cal: 13

## 2019-11-23 LAB — VITAMIN D 25 HYDROXY (VIT D DEFICIENCY, FRACTURES): VITAMIN D 25-HYDROXY: 70.3

## 2019-11-24 LAB — VITAMIN D 25 HYDROXY (VIT D DEFICIENCY, FRACTURES): VITAMIN D 25-HYDROXY: 70.3

## 2019-11-26 ENCOUNTER — Telehealth: Payer: Self-pay | Admitting: Cardiology

## 2019-11-26 NOTE — Telephone Encounter (Signed)
Returned the call to the daughter. She stated that the patient recently had covid (December) and it hit her hard. She would like to know if Dr. Jens Som thinks it is necessary for the patient to have an echo before the March appointment with him.

## 2019-11-26 NOTE — Telephone Encounter (Signed)
Will review at ov Marlayna Bannister  

## 2019-11-26 NOTE — Telephone Encounter (Signed)
Patient's daughter calling wanting to know if the patient needs to have an echo prior to her appointment. She says the patient's last echo was March 2020 and that she had COVID in December.

## 2019-11-27 ENCOUNTER — Telehealth (INDEPENDENT_AMBULATORY_CARE_PROVIDER_SITE_OTHER): Payer: Medicare Other | Admitting: Family Medicine

## 2019-11-27 ENCOUNTER — Other Ambulatory Visit: Payer: Self-pay

## 2019-11-27 ENCOUNTER — Encounter: Payer: Self-pay | Admitting: Family Medicine

## 2019-11-27 VITALS — BP 119/60 | Ht 62.5 in | Wt 114.0 lb

## 2019-11-27 DIAGNOSIS — I509 Heart failure, unspecified: Secondary | ICD-10-CM

## 2019-11-27 DIAGNOSIS — I10 Essential (primary) hypertension: Secondary | ICD-10-CM | POA: Diagnosis not present

## 2019-11-27 DIAGNOSIS — Z8616 Personal history of COVID-19: Secondary | ICD-10-CM | POA: Diagnosis not present

## 2019-11-27 NOTE — Telephone Encounter (Signed)
Left message of dr crenshaw's recommendations. 

## 2019-11-27 NOTE — Progress Notes (Signed)
Virtual Visit via Telephone Note  I connected with [name] on 11/27/19 at 10:40 AM EST by telephone and verified that I am speaking with the correct person using two identifiers.DOB/address  Location: Patient: home with caregiver Provider: clinic   I discussed the limitations, risks, security and privacy concerns of performing an evaluation and management service by telephone and the availability of in person appointments. I also discussed with the patient that there may be a patient responsible charge related to this service. The patient expressed understanding and agreed to proceed.  Reached daughter who stated pt feeling better but continuing to struggle with fatigue.  Pt with difficulty sleeping at times/pt with less cough. Eating snacks throughout the day History of Present Illness: +COVID with h/o CHF-took additional doses of Lasix. cxr improving. Pt with anoro, oxygen 3L . Pt states cough improved. Pt with difficulty sleeping-no longer taking Xanax.   cxr shows IMPRESSION: 1. Prior CABG. Stable cardiomegaly. No pulmonary venous congestion.  2. COPD. Significant improvement of bilateral pulmonary infiltrates with mild residual. Mild bilateral subsegmental atelectasis. Tiny right pleural effusion cannot be excluded   Observations/Objective: No readings  Assessment and Plan: 1. History of COVID-19 Continued fatigue-difficult sleeping-continue oxygen, melatonin 3 hours prior to bedtime.  2. Other congestive heart failure (HCC) cxr in 1 month  3. Essential hypertension Coreg/lasix/spironlactone Follow Up Instructions:   cxr 3-4 week I discussed the assessment and treatment plan with the patient. The patient was provided an opportunity to ask questions and all were answered. The patient agreed with the plan and demonstrated an understanding of the instructions.   The patient was advised to call back or seek an in-person evaluation if the symptoms worsen or if the  condition fails to improve as anticipated. Spoke with pts daughter by telephone, spoke to the patients I provided 20 minutes of non-face-to-face time during this encounter.   Briony Parveen Mat Carne, MD

## 2019-11-29 ENCOUNTER — Ambulatory Visit (INDEPENDENT_AMBULATORY_CARE_PROVIDER_SITE_OTHER): Payer: Medicare Other | Admitting: Interventional Cardiology

## 2019-11-29 DIAGNOSIS — U071 COVID-19: Secondary | ICD-10-CM | POA: Diagnosis not present

## 2019-11-29 DIAGNOSIS — J439 Emphysema, unspecified: Secondary | ICD-10-CM | POA: Diagnosis not present

## 2019-11-29 DIAGNOSIS — J9621 Acute and chronic respiratory failure with hypoxia: Secondary | ICD-10-CM | POA: Diagnosis not present

## 2019-11-29 DIAGNOSIS — I2510 Atherosclerotic heart disease of native coronary artery without angina pectoris: Secondary | ICD-10-CM | POA: Diagnosis not present

## 2019-11-29 DIAGNOSIS — Z5181 Encounter for therapeutic drug level monitoring: Secondary | ICD-10-CM | POA: Diagnosis not present

## 2019-11-29 DIAGNOSIS — I130 Hypertensive heart and chronic kidney disease with heart failure and stage 1 through stage 4 chronic kidney disease, or unspecified chronic kidney disease: Secondary | ICD-10-CM | POA: Diagnosis not present

## 2019-11-29 DIAGNOSIS — J1289 Other viral pneumonia: Secondary | ICD-10-CM | POA: Diagnosis not present

## 2019-11-29 LAB — POCT INR: INR: 3.4 — AB (ref 2.0–3.0)

## 2019-11-29 NOTE — Patient Instructions (Signed)
Description    Called and spoke to [place] from [facility], instructed for pt to hold warfarin today and then continue to take 1/2 tablet daily except 1 tablet on Tuesdays and Saturdays.  Repeat INR in 2 weeks- in office.  [facility] [phone].

## 2019-12-02 DIAGNOSIS — R32 Unspecified urinary incontinence: Secondary | ICD-10-CM | POA: Diagnosis not present

## 2019-12-02 DIAGNOSIS — E1122 Type 2 diabetes mellitus with diabetic chronic kidney disease: Secondary | ICD-10-CM | POA: Diagnosis not present

## 2019-12-02 DIAGNOSIS — J439 Emphysema, unspecified: Secondary | ICD-10-CM | POA: Diagnosis not present

## 2019-12-02 DIAGNOSIS — J9621 Acute and chronic respiratory failure with hypoxia: Secondary | ICD-10-CM | POA: Diagnosis not present

## 2019-12-02 DIAGNOSIS — I130 Hypertensive heart and chronic kidney disease with heart failure and stage 1 through stage 4 chronic kidney disease, or unspecified chronic kidney disease: Secondary | ICD-10-CM | POA: Diagnosis not present

## 2019-12-02 DIAGNOSIS — G319 Degenerative disease of nervous system, unspecified: Secondary | ICD-10-CM | POA: Diagnosis not present

## 2019-12-02 DIAGNOSIS — Z7952 Long term (current) use of systemic steroids: Secondary | ICD-10-CM | POA: Diagnosis not present

## 2019-12-02 DIAGNOSIS — Z9981 Dependence on supplemental oxygen: Secondary | ICD-10-CM | POA: Diagnosis not present

## 2019-12-02 DIAGNOSIS — N1831 Chronic kidney disease, stage 3a: Secondary | ICD-10-CM | POA: Diagnosis not present

## 2019-12-02 DIAGNOSIS — I6782 Cerebral ischemia: Secondary | ICD-10-CM | POA: Diagnosis not present

## 2019-12-02 DIAGNOSIS — Z87891 Personal history of nicotine dependence: Secondary | ICD-10-CM | POA: Diagnosis not present

## 2019-12-02 DIAGNOSIS — I5022 Chronic systolic (congestive) heart failure: Secondary | ICD-10-CM | POA: Diagnosis not present

## 2019-12-02 DIAGNOSIS — U071 COVID-19: Secondary | ICD-10-CM | POA: Diagnosis not present

## 2019-12-02 DIAGNOSIS — Z7901 Long term (current) use of anticoagulants: Secondary | ICD-10-CM | POA: Diagnosis not present

## 2019-12-02 DIAGNOSIS — F0280 Dementia in other diseases classified elsewhere without behavioral disturbance: Secondary | ICD-10-CM | POA: Diagnosis not present

## 2019-12-02 DIAGNOSIS — J1289 Other viral pneumonia: Secondary | ICD-10-CM | POA: Diagnosis not present

## 2019-12-02 DIAGNOSIS — Z951 Presence of aortocoronary bypass graft: Secondary | ICD-10-CM | POA: Diagnosis not present

## 2019-12-02 DIAGNOSIS — I700 Atherosclerosis of aorta: Secondary | ICD-10-CM | POA: Diagnosis not present

## 2019-12-02 DIAGNOSIS — N179 Acute kidney failure, unspecified: Secondary | ICD-10-CM | POA: Diagnosis not present

## 2019-12-02 DIAGNOSIS — Z952 Presence of prosthetic heart valve: Secondary | ICD-10-CM | POA: Diagnosis not present

## 2019-12-02 DIAGNOSIS — I2510 Atherosclerotic heart disease of native coronary artery without angina pectoris: Secondary | ICD-10-CM | POA: Diagnosis not present

## 2019-12-02 DIAGNOSIS — I420 Dilated cardiomyopathy: Secondary | ICD-10-CM | POA: Diagnosis not present

## 2019-12-02 DIAGNOSIS — J9811 Atelectasis: Secondary | ICD-10-CM | POA: Diagnosis not present

## 2019-12-02 DIAGNOSIS — R918 Other nonspecific abnormal finding of lung field: Secondary | ICD-10-CM | POA: Diagnosis not present

## 2019-12-02 DIAGNOSIS — Z5181 Encounter for therapeutic drug level monitoring: Secondary | ICD-10-CM | POA: Diagnosis not present

## 2019-12-05 ENCOUNTER — Telehealth (INDEPENDENT_AMBULATORY_CARE_PROVIDER_SITE_OTHER): Payer: Self-pay

## 2019-12-05 DIAGNOSIS — J9621 Acute and chronic respiratory failure with hypoxia: Secondary | ICD-10-CM | POA: Diagnosis not present

## 2019-12-05 DIAGNOSIS — J1289 Other viral pneumonia: Secondary | ICD-10-CM | POA: Diagnosis not present

## 2019-12-05 DIAGNOSIS — J439 Emphysema, unspecified: Secondary | ICD-10-CM | POA: Diagnosis not present

## 2019-12-05 DIAGNOSIS — I2510 Atherosclerotic heart disease of native coronary artery without angina pectoris: Secondary | ICD-10-CM | POA: Diagnosis not present

## 2019-12-05 DIAGNOSIS — I130 Hypertensive heart and chronic kidney disease with heart failure and stage 1 through stage 4 chronic kidney disease, or unspecified chronic kidney disease: Secondary | ICD-10-CM | POA: Diagnosis not present

## 2019-12-05 DIAGNOSIS — U071 COVID-19: Secondary | ICD-10-CM | POA: Diagnosis not present

## 2019-12-05 NOTE — Telephone Encounter (Signed)
[name] is asking about a second chest x-ray is supposed to be ordered, please advise?

## 2019-12-06 ENCOUNTER — Other Ambulatory Visit: Payer: Self-pay | Admitting: Family Medicine

## 2019-12-06 DIAGNOSIS — R059 Cough, unspecified: Secondary | ICD-10-CM

## 2019-12-06 DIAGNOSIS — R05 Cough: Secondary | ICD-10-CM

## 2019-12-06 DIAGNOSIS — I420 Dilated cardiomyopathy: Secondary | ICD-10-CM

## 2019-12-06 NOTE — Telephone Encounter (Signed)
[name] is aware of the recommendation

## 2019-12-06 NOTE — Telephone Encounter (Signed)
Cxr sent to [facility]

## 2019-12-11 ENCOUNTER — Other Ambulatory Visit: Payer: Self-pay

## 2019-12-11 ENCOUNTER — Ambulatory Visit (HOSPITAL_COMMUNITY)
Admission: RE | Admit: 2019-12-11 | Discharge: 2019-12-11 | Disposition: A | Discharge status: Home / Self Care | Payer: Medicare Other | Source: Ambulatory Visit | Attending: Family Medicine | Admitting: Family Medicine

## 2019-12-11 DIAGNOSIS — I420 Dilated cardiomyopathy: Secondary | ICD-10-CM | POA: Diagnosis not present

## 2019-12-11 DIAGNOSIS — R05 Cough: Secondary | ICD-10-CM | POA: Insufficient documentation

## 2019-12-11 DIAGNOSIS — R059 Cough, unspecified: Secondary | ICD-10-CM

## 2019-12-12 DIAGNOSIS — U071 COVID-19: Secondary | ICD-10-CM | POA: Diagnosis not present

## 2019-12-12 DIAGNOSIS — I2510 Atherosclerotic heart disease of native coronary artery without angina pectoris: Secondary | ICD-10-CM | POA: Diagnosis not present

## 2019-12-12 DIAGNOSIS — J1289 Other viral pneumonia: Secondary | ICD-10-CM | POA: Diagnosis not present

## 2019-12-12 DIAGNOSIS — J439 Emphysema, unspecified: Secondary | ICD-10-CM | POA: Diagnosis not present

## 2019-12-12 DIAGNOSIS — I130 Hypertensive heart and chronic kidney disease with heart failure and stage 1 through stage 4 chronic kidney disease, or unspecified chronic kidney disease: Secondary | ICD-10-CM | POA: Diagnosis not present

## 2019-12-12 DIAGNOSIS — J9621 Acute and chronic respiratory failure with hypoxia: Secondary | ICD-10-CM | POA: Diagnosis not present

## 2019-12-13 ENCOUNTER — Other Ambulatory Visit: Payer: Self-pay

## 2019-12-13 ENCOUNTER — Ambulatory Visit (INDEPENDENT_AMBULATORY_CARE_PROVIDER_SITE_OTHER): Payer: Medicare Other

## 2019-12-13 DIAGNOSIS — Z952 Presence of prosthetic heart valve: Secondary | ICD-10-CM

## 2019-12-13 DIAGNOSIS — I359 Nonrheumatic aortic valve disorder, unspecified: Secondary | ICD-10-CM | POA: Diagnosis not present

## 2019-12-13 DIAGNOSIS — Z5181 Encounter for therapeutic drug level monitoring: Secondary | ICD-10-CM

## 2019-12-13 DIAGNOSIS — I6350 Cerebral infarction due to unspecified occlusion or stenosis of unspecified cerebral artery: Secondary | ICD-10-CM | POA: Diagnosis not present

## 2019-12-13 LAB — POCT INR: INR: 3.7 — AB (ref 2.0–3.0)

## 2019-12-13 NOTE — Patient Instructions (Signed)
Description   Skip today's dosage of Coumadin, then start taking 1/2 tablet daily except 1 tablet on Tuesdays.  Repeat INR in 2 weeks.  [facility]in Clinic [phone].

## 2019-12-14 DIAGNOSIS — I130 Hypertensive heart and chronic kidney disease with heart failure and stage 1 through stage 4 chronic kidney disease, or unspecified chronic kidney disease: Secondary | ICD-10-CM | POA: Diagnosis not present

## 2019-12-14 DIAGNOSIS — J1289 Other viral pneumonia: Secondary | ICD-10-CM | POA: Diagnosis not present

## 2019-12-14 DIAGNOSIS — J9621 Acute and chronic respiratory failure with hypoxia: Secondary | ICD-10-CM | POA: Diagnosis not present

## 2019-12-14 DIAGNOSIS — J439 Emphysema, unspecified: Secondary | ICD-10-CM | POA: Diagnosis not present

## 2019-12-14 DIAGNOSIS — I2510 Atherosclerotic heart disease of native coronary artery without angina pectoris: Secondary | ICD-10-CM | POA: Diagnosis not present

## 2019-12-14 DIAGNOSIS — U071 COVID-19: Secondary | ICD-10-CM | POA: Diagnosis not present

## 2019-12-17 ENCOUNTER — Telehealth: Payer: Self-pay | Admitting: Family Medicine

## 2019-12-17 ENCOUNTER — Other Ambulatory Visit: Payer: Self-pay | Admitting: Family Medicine

## 2019-12-17 DIAGNOSIS — Z8616 Personal history of COVID-19: Secondary | ICD-10-CM

## 2019-12-17 DIAGNOSIS — U071 COVID-19: Secondary | ICD-10-CM

## 2019-12-17 DIAGNOSIS — R059 Cough, unspecified: Secondary | ICD-10-CM

## 2019-12-17 DIAGNOSIS — J1282 Pneumonia due to coronavirus disease 2019: Secondary | ICD-10-CM

## 2019-12-17 DIAGNOSIS — R05 Cough: Secondary | ICD-10-CM

## 2019-12-17 DIAGNOSIS — I5042 Chronic combined systolic (congestive) and diastolic (congestive) heart failure: Secondary | ICD-10-CM

## 2019-12-17 NOTE — Telephone Encounter (Signed)
Routing to Dr. Corum for Advice? 

## 2019-12-17 NOTE — Telephone Encounter (Signed)
Patient is wanting  a referral to [facility]

## 2019-12-17 NOTE — Telephone Encounter (Signed)
Take an additional dose of Lasix tomorrow-one dose in the morning and one at noon. Would recommend a pulmonary referral for evaluation to see if additional pulmonary medications are needed

## 2019-12-17 NOTE — Telephone Encounter (Signed)
Patient daughter [name] is calling and states that Dr. Judee Clara wanted them try Mucinex 600mg  twice a day and states that she gave it to her on Thursday and Friday and then on Friday night she had a breathing episode but once giving her a breathing treatment she was better. She has not given her anymore since Friday night and wanted to know if Dr. Tuesday wants them to try something else.

## 2019-12-18 NOTE — Telephone Encounter (Signed)
Pt has appointment with Dr. Sherene Sires 2/16

## 2019-12-19 DIAGNOSIS — J1289 Other viral pneumonia: Secondary | ICD-10-CM | POA: Diagnosis not present

## 2019-12-19 DIAGNOSIS — U071 COVID-19: Secondary | ICD-10-CM | POA: Diagnosis not present

## 2019-12-19 DIAGNOSIS — J439 Emphysema, unspecified: Secondary | ICD-10-CM | POA: Diagnosis not present

## 2019-12-19 DIAGNOSIS — J9621 Acute and chronic respiratory failure with hypoxia: Secondary | ICD-10-CM | POA: Diagnosis not present

## 2019-12-19 DIAGNOSIS — I2510 Atherosclerotic heart disease of native coronary artery without angina pectoris: Secondary | ICD-10-CM | POA: Diagnosis not present

## 2019-12-19 DIAGNOSIS — I130 Hypertensive heart and chronic kidney disease with heart failure and stage 1 through stage 4 chronic kidney disease, or unspecified chronic kidney disease: Secondary | ICD-10-CM | POA: Diagnosis not present

## 2019-12-20 ENCOUNTER — Telehealth (INDEPENDENT_AMBULATORY_CARE_PROVIDER_SITE_OTHER): Payer: Medicare Other | Admitting: Family Medicine

## 2019-12-20 ENCOUNTER — Encounter: Payer: Self-pay | Admitting: Family Medicine

## 2019-12-20 ENCOUNTER — Other Ambulatory Visit: Payer: Self-pay

## 2019-12-20 VITALS — BP 119/60 | Ht 62.5 in | Wt 114.0 lb

## 2019-12-20 DIAGNOSIS — R059 Cough, unspecified: Secondary | ICD-10-CM

## 2019-12-20 DIAGNOSIS — R05 Cough: Secondary | ICD-10-CM

## 2019-12-20 DIAGNOSIS — Z8616 Personal history of COVID-19: Secondary | ICD-10-CM | POA: Diagnosis not present

## 2019-12-20 DIAGNOSIS — I5042 Chronic combined systolic (congestive) and diastolic (congestive) heart failure: Secondary | ICD-10-CM

## 2019-12-20 DIAGNOSIS — J418 Mixed simple and mucopurulent chronic bronchitis: Secondary | ICD-10-CM

## 2019-12-22 ENCOUNTER — Other Ambulatory Visit: Payer: Self-pay | Admitting: Cardiology

## 2019-12-24 ENCOUNTER — Encounter: Payer: Self-pay | Admitting: *Deleted

## 2019-12-24 ENCOUNTER — Other Ambulatory Visit: Payer: Self-pay | Admitting: *Deleted

## 2019-12-24 DIAGNOSIS — I5042 Chronic combined systolic (congestive) and diastolic (congestive) heart failure: Secondary | ICD-10-CM | POA: Diagnosis not present

## 2019-12-24 DIAGNOSIS — R059 Cough, unspecified: Secondary | ICD-10-CM | POA: Insufficient documentation

## 2019-12-24 DIAGNOSIS — R05 Cough: Secondary | ICD-10-CM | POA: Insufficient documentation

## 2019-12-24 LAB — BASIC METABOLIC PANEL
BUN/Creatinine Ratio: 13 (calc) (ref 6–22)
BUN: 12 mg/dL (ref 7–25)
CO2: 35 mmol/L — ABNORMAL HIGH (ref 20–32)
Calcium: 9.1 mg/dL (ref 8.6–10.4)
Chloride: 105 mmol/L (ref 98–110)
Creat: 0.92 mg/dL — ABNORMAL HIGH (ref 0.60–0.88)
Glucose, Bld: 100 mg/dL (ref 65–139)
Potassium: 4.3 mmol/L (ref 3.5–5.3)
Sodium: 144 mmol/L (ref 135–146)

## 2019-12-24 NOTE — Patient Outreach (Signed)
Telephone outreach.  Pt's daughter, [name], reports her mother has recovered fairly well from her COVID infection. She is out of quaranteen now. Her main issues are her comorbid diagnoses: HF and COPD, DM, CKD, Dementia.  Daughter, [name], reports pt still has 24/7 supervision by caregiver, [name]. [name] makes sure pt is safe, provides her meals and snacks, provides companionship, ensures she takes her medications, weighs daily. [name] is very good at identifying problems and reporting them to [facility].  Pt will see new pulmonologist next week. Has established with new primary care provider.  Encouraged for daughter to call if she has any questions. Encouraged her to write down all her questions for Dr. Sherene Sires.  I will call after 01/01/20.  Zara Council. Burgess Estelle, MSN, [facility] Gerontological Nurse Practitioner [facility] Care Management [phone]

## 2019-12-24 NOTE — Patient Instructions (Signed)
BMP-no fasting  Keep pulmonary appt

## 2019-12-24 NOTE — Progress Notes (Signed)
Virtual Visit via Telephone Note  I connected with [name] on 12/21/2019 at  1:40 PM EST by telephone and verified that I am speaking with the correct person using two identifiers.  Location: Patient: home Provider: clinic  called daughter prior to calling pt at her request. I discussed the limitations, risks, security and privacy concerns of performing an evaluation and management service by telephone and the availability of in person appointments. I also discussed with the patient that there may be a patient responsible charge related to this service. The patient expressed understanding and agreed to proceed.   History of Present Illness: Pt with admission for COVID with episode of SOB last Friday per daughter. Pt started back using oxygen due to SOB and notes increase use when completing her PT for strength and stability.  Pt has taken extra doses  Of Lasix as previously discussed. pts weight remains stable at 111 lbs. Pt has not been taking Potassium with her Lasix .  Lasix taken M-T -W of this week. Pt states mucinex did not help her cough-she is drinking water. Pt reports that LE edema has improved. Daughter reports O2 sat 98-99 on RA with drops when walking   Observations/Objective: Sat monitor 98-99%  Assessment and Plan:  1. Chronic combined systolic and diastolic CHF (congestive heart failure) (HCC) Lasix 3 doses over the last week with improved LE edema but continued cough - Basic metabolic panel  2. History of COVID-19 Weakness associated-pt currently undergoing PT  3. Cough Using water, mucinex did not help symptoms  4. Mixed simple and mucopurulent chronic bronchitis (HCC) Pulmonary appt 2/16 for additional evaluation Follow Up Instructions: Keep pulmonary appt , BMP to check renal function and K   I discussed the assessment and treatment plan with the patient. The patient was provided an opportunity to ask questions and all were answered. The patient agreed with  the plan and demonstrated an understanding of the instructions.   The patient was advised to call back or seek an in-person evaluation if the symptoms worsen or if the condition fails to improve as anticipated.  I provided 30 minutes of non-face-to-face time during this encounter.   Nieves Chapa Mat Carne, MD

## 2019-12-26 DIAGNOSIS — U071 COVID-19: Secondary | ICD-10-CM | POA: Diagnosis not present

## 2019-12-26 DIAGNOSIS — J439 Emphysema, unspecified: Secondary | ICD-10-CM | POA: Diagnosis not present

## 2019-12-26 DIAGNOSIS — I130 Hypertensive heart and chronic kidney disease with heart failure and stage 1 through stage 4 chronic kidney disease, or unspecified chronic kidney disease: Secondary | ICD-10-CM | POA: Diagnosis not present

## 2019-12-26 DIAGNOSIS — J9621 Acute and chronic respiratory failure with hypoxia: Secondary | ICD-10-CM | POA: Diagnosis not present

## 2019-12-26 DIAGNOSIS — J1289 Other viral pneumonia: Secondary | ICD-10-CM | POA: Diagnosis not present

## 2019-12-26 DIAGNOSIS — I2510 Atherosclerotic heart disease of native coronary artery without angina pectoris: Secondary | ICD-10-CM | POA: Diagnosis not present

## 2019-12-27 ENCOUNTER — Ambulatory Visit (INDEPENDENT_AMBULATORY_CARE_PROVIDER_SITE_OTHER): Payer: Medicare Other

## 2019-12-27 ENCOUNTER — Telehealth: Payer: Self-pay | Admitting: Family Medicine

## 2019-12-27 ENCOUNTER — Other Ambulatory Visit: Payer: Self-pay

## 2019-12-27 DIAGNOSIS — I359 Nonrheumatic aortic valve disorder, unspecified: Secondary | ICD-10-CM

## 2019-12-27 DIAGNOSIS — Z5181 Encounter for therapeutic drug level monitoring: Secondary | ICD-10-CM | POA: Diagnosis not present

## 2019-12-27 DIAGNOSIS — I6350 Cerebral infarction due to unspecified occlusion or stenosis of unspecified cerebral artery: Secondary | ICD-10-CM

## 2019-12-27 DIAGNOSIS — Z952 Presence of prosthetic heart valve: Secondary | ICD-10-CM | POA: Diagnosis not present

## 2019-12-27 LAB — POCT INR: INR: 1.7 — AB (ref 2.0–3.0)

## 2019-12-27 NOTE — Telephone Encounter (Signed)
[name] is aware of the results

## 2019-12-27 NOTE — Telephone Encounter (Signed)
Routing to Dr Judee Clara for Results?

## 2019-12-27 NOTE — Telephone Encounter (Signed)
[name] is calling and would like a call back with patient recent lab results.   [phone]

## 2019-12-27 NOTE — Patient Instructions (Signed)
Description   Take 1 tablet today, then start taking 1/2 tablet daily except 1 tablet on Tuesdays and Saturdays.  Repeat INR in 2 weeks.  [facility] [phone].

## 2019-12-27 NOTE — Telephone Encounter (Signed)
Kidney function looks much better!!

## 2020-01-01 ENCOUNTER — Other Ambulatory Visit: Payer: Self-pay

## 2020-01-01 ENCOUNTER — Other Ambulatory Visit: Payer: Self-pay | Admitting: Cardiology

## 2020-01-01 ENCOUNTER — Ambulatory Visit (INDEPENDENT_AMBULATORY_CARE_PROVIDER_SITE_OTHER): Payer: Medicare Other

## 2020-01-01 ENCOUNTER — Ambulatory Visit (INDEPENDENT_AMBULATORY_CARE_PROVIDER_SITE_OTHER): Payer: Medicare Other | Admitting: Internal Medicine

## 2020-01-01 ENCOUNTER — Encounter: Payer: Self-pay | Admitting: Internal Medicine

## 2020-01-01 DIAGNOSIS — J449 Chronic obstructive pulmonary disease, unspecified: Secondary | ICD-10-CM

## 2020-01-01 DIAGNOSIS — J418 Mixed simple and mucopurulent chronic bronchitis: Secondary | ICD-10-CM | POA: Diagnosis not present

## 2020-01-01 DIAGNOSIS — I6350 Cerebral infarction due to unspecified occlusion or stenosis of unspecified cerebral artery: Secondary | ICD-10-CM

## 2020-01-01 DIAGNOSIS — I779 Disorder of arteries and arterioles, unspecified: Secondary | ICD-10-CM | POA: Diagnosis not present

## 2020-01-01 DIAGNOSIS — U071 COVID-19: Secondary | ICD-10-CM | POA: Diagnosis not present

## 2020-01-01 DIAGNOSIS — I25708 Atherosclerosis of coronary artery bypass graft(s), unspecified, with other forms of angina pectoris: Secondary | ICD-10-CM | POA: Diagnosis not present

## 2020-01-01 DIAGNOSIS — J9612 Chronic respiratory failure with hypercapnia: Secondary | ICD-10-CM

## 2020-01-01 DIAGNOSIS — J9611 Chronic respiratory failure with hypoxia: Secondary | ICD-10-CM | POA: Diagnosis not present

## 2020-01-01 MED ORDER — LEVALBUTEROL HCL 0.63 MG/3ML IN NEBU: 0.6300 mg | INHALATION_SOLUTION | RESPIRATORY_TRACT | Status: DC | PRN

## 2020-01-01 NOTE — Progress Notes (Signed)
[name], female    DOB: 10-Jun-1926,    MRN: [id number]   Brief patient profile:  52 yowf pt of [name] previously - she reports she quit smoking around 1997 s resp symptoms then  and placed on 02 around 2014 with dx of systolic chf and maint on anoro    Admit Date: 10/25/2019 Discharge date: 10/30/2019    CODE STATUS: DNR      Brief Summary: See H&P, Labs, Consult and Test reports for all details in brief, Patient is a89 y.o.femalewith PMHx of chronic systolic heart failure, CAD s/p CABG, mechanical aortic valve on anticoagulation, COPD on 3 L of oxygen at home-presented with shortness of breath-found to have acute hypoxic respiratory failure in the setting of COVID-19 pneumonia and decompensated systolic heart failure  Brief Hospital Course: Acute on chronic hypoxic Resp Failure due to Covid 19 Viral pneumonia and decompensated systolic heart failure (EF 25-30% March 2020):Stable-on just 2 L of oxygen.  Treated with Lasix along with steroids and remdesivir, inflammatory markers are slowly downtrending. Volume status is stable-on oral diuretic regimen.  Patient will complete remdesivir on 12/15-she will be discharged on a few days of tapering steroids.    COVID-19 Labs:  Recent Labs (last 2 labs)        Recent Labs    10/28/19 0505 10/29/19 0326 10/30/19 0428  DDIMER 1.48* 0.75* 0.84*  FERRITIN 1,999* 1,131* 769*  CRP 8.4* 5.5* 3.8*      Recent Labs       Lab Results  Component Value Date   SARSCOV2NAA DETECTED (A) 10/20/2019       COVID-19 Medications: Steroids: 12/11>>12/15 Remdesivir: 12/10>>  Other medications: Diuretics:Euvolemic, negative balance of 2.3 L-on oral diuretic regimen Antibiotics:Not needed as no evidence of bacterial infection Insulin: CBG stable with SSI. A1c 5.7 on 12/12.  Nausea/Vomiting:Probably due to COVID-19-continue supportive care-as needed antiemetics.  Elevated troponin: Secondary demand  ischemia-prior hospitalist Dr. Marinus Simpson with cardiology-recommendations are for conservative management.  Transaminitis: Appears mild-secondary to COVID-19-has resolved by the time of discharge.  AKIon CKD stage IIIa:AKI improved-continue to monitor closely while on diuretics.  COPD-on 3 L of oxygen at home:No signs of exacerbation-continue bronchodilators  CAD s/p CABG:No anginal symptoms-see above regarding troponins  History of aortic valve replacement-mechanical valve: On Coumadin-INR supratherapeutic despite of holding Coumadin for several days-managed by pharmacy.  Spoke with pharmacy team-continue to hold Coumadin on discharge until INR is checked in 2 days.-Case management will arrange for INR check by home health RN on 12/17 subsequently dosing of Coumadin will be adjusted by the Coumadin clinic.  Dementia:Appears to be mild-continue Aricept    History of Present Illness  01/01/2020  Pulmonary/ 1st office eval/[name]  Chief Complaint  Patient presents with  . Consult    Former Dr. Juanetta Simpson patient for severe COPD. Currently on 3L. Uses 4L at home. States her breathing has been ok.   Dyspnea: Baseline  Goes to gym rides bike x 20 min s stopping on RA does not monitor sats Cough: none now Sleep: no resp ccs x 30 degrees HOB SABA use: neb daily "whether needs it or not "  (says doesn't think she does)  No obvious day to day or daytime variability or assoc excess/ purulent sputum or mucus plugs or hemoptysis or cp or chest tightness, subjective wheeze or overt sinus or hb symptoms.   Sleeping as above  without nocturnal  or early am exacerbation  of respiratory  c/o's or need for noct saba. Also  denies any obvious fluctuation of symptoms with weather or environmental changes or other aggravating or alleviating factors except as outlined above   No unusual exposure hx or h/o childhood pna/ asthma or knowledge of premature birth.  Current Allergies, Complete Past  Medical History, Past Surgical History, Family History, and Social History were reviewed in [employer] record.  ROS  The following are not active complaints unless bolded Hoarseness, sore throat, dysphagia, dental problems, itching, sneezing,  nasal congestion or discharge of excess mucus or purulent secretions, ear ache,   fever, chills, sweats, unintended wt loss or wt gain, classically pleuritic or exertional cp,  orthopnea pnd or arm/hand swelling  or leg swelling, presyncope, palpitations, abdominal pain, anorexia, nausea, vomiting, diarrhea  or change in bowel habits or change in bladder habits, change in stools or change in urine, dysuria, hematuria,  rash, arthralgias, visual complaints, headache, numbness, weakness or ataxia or problems with walking or coordination,  change in mood or  memory.           Past Medical History:  Diagnosis Date  . Anemia   . Anxiety   . Arthritis   . Blood transfusion without reported diagnosis   . Carotid artery stenosis    Without infarction  . Cataract   . Chronic combined systolic (congestive) and diastolic (congestive) heart failure (HCC)   . COPD (chronic obstructive pulmonary disease) (HCC)   . Coronary artery disease   . Heart valve replaced by other means   . Hypercholesterolemia    Pure  . Hypertension    Unspecified  . LBBB (left bundle branch block)   . Macular degeneration (senile) of retina, unspecified   . On home O2   . Postsurgical aortocoronary bypass status   . Stroke (HCC)   . Transient global amnesia   . Unspecified hereditary and idiopathic peripheral neuropathy   . Unspecified vitamin D deficiency     Outpatient Medications Prior to Visit  Medication Sig Dispense Refill  . acetaminophen (TYLENOL) 325 MG tablet Take 650 mg by mouth every 4 (four) hours as needed.    . Ascorbic Acid (VITAMIN C) 500 MG CAPS Take 500 mg by mouth daily.    Marland Kitchen atorvastatin (LIPITOR) 40 MG tablet TAKE ONE TABLET BY  MOUTH ONCE DAILY. 90 tablet 1  . baclofen (LIORESAL) 10 MG tablet Take 10 mg by mouth as needed for muscle spasms.    . carvedilol (COREG) 6.25 MG tablet TAKE (1) TABLET BY MOUTH TWICE DAILY. 60 tablet 6  . dexamethasone (DECADRON) 6 MG tablet Take 1 tablet (6 mg total) by mouth daily. 3 tablet 0  . docusate sodium (COLACE) 100 MG capsule Take 100 mg by mouth at bedtime.     . donepezil (ARICEPT) 10 MG tablet TAKE ONE TABLET BY MOUTH ONCE DAILY. 30 tablet 0  . ergocalciferol (VITAMIN D2) 50000 UNITS capsule Take 50,000 Units by mouth every Tuesday.     . ferrous sulfate 325 (65 FE) MG EC tablet Take 325 mg by mouth 2 (two) times daily.    . furosemide (LASIX) 20 MG tablet TAKE ONE TABLET BY MOUTH ONCE DAILY.TAKE EXTRA IF NEEDED. 180 tablet 0  . hydrocortisone (ANUSOL-HC) 2.5 % rectal cream Place 1 application rectally 3 (three) times daily as needed for hemorrhoids or anal itching.    . levalbuterol (XOPENEX) 0.63 MG/3ML nebulizer solution Take 0.63 mg by nebulization every evening.    . meclizine (ANTIVERT) 25 MG tablet Take 25 mg by mouth 3 (  three) times daily as needed for dizziness.    . Melatonin 3 MG TABS Take 1 tablet by mouth at bedtime.    . multivitamin-lutein (OCUVITE-LUTEIN) CAPS capsule Take 1 capsule by mouth daily.    . pregabalin (LYRICA) 75 MG capsule Take 1 capsule (75 mg total) by mouth every other day. 15 capsule 5  . spironolactone (ALDACTONE) 25 MG tablet TAKE (1/2) TABLET BY MOUTH DAILY. 45 tablet 6  . traZODone (DESYREL) 50 MG tablet Take 0.5-1 tablets (25-50 mg total) by mouth at bedtime as needed for sleep. 30 tablet 3  . umeclidinium-vilanterol (ANORO ELLIPTA) 62.5-25 MCG/INH AEPB Inhale 1 puff into the lungs daily.     Marland Kitchen warfarin (COUMADIN) 2.5 MG tablet Take 1 tablet (2.5 mg total) by mouth every evening. DO NOT TAKE COUMADIN UNTIL YOUR INR IS DRAWN ON 12/17-AND YOU RECEIVE further instructions from Coumadin clinic.    Marland Kitchen zinc sulfate 220 (50 Zn) MG capsule Take 220 mg  by mouth daily.        Objective:     BP 118/70 (BP Location: Left Arm, Patient Position: Sitting, Cuff Size: Normal)   Pulse 67   Temp 97.8 F (36.6 C) (Temporal)   Ht 5' 2.5" (1.588 m)   Wt 114 lb (51.7 kg)   SpO2 100% Comment: 3L pulse  BMI 20.52 kg/m   SpO2: 100 %(3L pulse) O2 Type: Pulse O2 O2 Flow Rate (L/min): 3 L/min   Pleasant ederly thin wf nad  HEENT : pt wearing mask not removed for exam due to covid - 19 concerns.   NECK :  without JVD/Nodes/TM/ nl carotid upstrokes bilaterally   LUNGS: no acc muscle use,  Min barrel  contour chest wall with bilateral  slightly decreased bs s audible wheeze and  without cough on insp or exp maneuvers and min  Hyperresonant  to  percussion bilaterally     CV:  RRR  no s3 or murmur or increase in P2, and no edema   ABD:  soft and nontender with pos end  insp Hoover's  in the supine position. No bruits or organomegaly appreciated, bowel sounds nl  MS:   Nl gait/  ext warm without deformities, calf tenderness, cyanosis or clubbing No obvious joint restrictions   SKIN: warm and dry without lesions    NEURO:  alert, approp, nl sensorium with  no motor or cerebellar deficits apparent.       CXR PA and Lateral:   01/01/2020 :    I personally reviewed images and   impression as follows:   Mild copd/ CM with small R effusion          Assessment   COPD GOLD B symptoms/risk  Quit smoking around 1997  - maint on anoro and prn xopenex neb as of ov 01/01/2020  - 01/01/2020  After extensive coaching inhaler device,  effectiveness =    90% with elipta    When respiratory symptoms begin or become refractory well after a patient reports complete smoking cessation,  Especially when this wasn't the case while they were smoking, a red flag is raised based on the work of Dr Primitivo Gauze which states:  if you quit smoking when your best day FEV1 is still well preserved it is highly unlikely you will progress to severe disease.  That is to  say, once the smoking stops,  the symptoms should not suddenly erupt or markedly worsen.  If so, the differential diagnosis should include  obesity/deconditioning,  LPR/Reflux/Aspiration syndromes,  occult CHF, or  especially side effect of medications commonly used in this population.    I suspect a lot of her "copd "  Symptoms are actually  cardiac asthma but she is well compensated on anoro so ok to continue but should not be using saba as a maint in this setting  I spent extra time with pt today reviewing appropriate use of albuterol for prn use on exertion with the following points: 1) saba is for relief of sob that does not improve by walking a slower pace or resting but rather if the pt does not improve after trying this first. 2) If the pt is convinced, as many are, that saba helps recover from activity faster then it's easy to tell if this is the case by re-challenging : ie stop, take the inhaler, then p 5 minutes try the exact same activity (intensity of workload) that just caused the symptoms and see if they are substantially diminished or not after saba 3) if there is an activity that reproducibly causes the symptoms, try the saba 15 min before the activity on alternate days   If in fact the saba really does help, then fine to continue to use it prn but advised may need to look closer at the maintenance regimen being used to achieve better control of airways disease with exertion.         Chronic respiratory failure with hypoxia and hypercapnia ([place]) Placed on 0 2014 by Dr Luan Pulling  - HC03 12/24/19  = 35  - advised 01/01/2020 :  Monitor sats with goal of > 94% at rest and keep > 90% with ex  Due to chf goal is to keep sats up well above 90% in absence of worse C02 retention.        Each maintenance medication was reviewed in detail including emphasizing most importantly the difference between maintenance and prns and under what circumstances the prns are to be triggered using an  action plan format where appropriate.  Total time for H and P, chart review, counseling, teaching device and generating customized AVS unique to this office visit / charting = 60 min          Christinia Gully, MD 01/01/2020

## 2020-01-01 NOTE — Patient Instructions (Addendum)
Plan A = Automatic = Always=    Anoro one click each am    Plan B = Backup (does not replace plan A, it supplements it  - only use your levoalbuterol nebulizer up to every 4 hours if you can't catch your breath   Make sure you check your oxygen saturations at highest level of activity to be sure it stays over 90% and adjust upward to maintain this level if needed but remember to turn it back to previous settings when you stop (to conserve your supply).    Goal at rest is to keep 02 above 94% at rest for your heart    Please remember to go to the  x-ray department  for your tests - we will call you with the results when they are available     Please schedule a follow up visit in 3 months in [place] but call sooner if needed  with all medications /inhalers/ solutions in hand so we can verify exactly what you are taking. This includes all medications from all doctors and over the counters

## 2020-01-02 ENCOUNTER — Encounter: Payer: Self-pay | Admitting: Internal Medicine

## 2020-01-02 ENCOUNTER — Other Ambulatory Visit: Payer: Self-pay | Admitting: *Deleted

## 2020-01-02 DIAGNOSIS — J9611 Chronic respiratory failure with hypoxia: Secondary | ICD-10-CM | POA: Insufficient documentation

## 2020-01-02 NOTE — Progress Notes (Signed)
Spoke with pt and notified of results per Dr. Wert. Pt verbalized understanding and denied any questions. 

## 2020-01-02 NOTE — Assessment & Plan Note (Signed)
Placed on 0 2014 by Dr [name]  - HC03 12/24/19  = 35  - advised 01/01/2020 :  Monitor sats with goal of > 94% at rest and keep > 90% with ex  Due to chf goal is to keep sats up well above 90% in absence of worse C02 retention.

## 2020-01-02 NOTE — Patient Outreach (Signed)
Telephone outreach.  Spoke with pt's daughter, [name], today. She reports they went to see Dr. Sherene Sires for the first time yesterday. He evaluated her and said she was recovering well and as expected with her underlying health problems of COPD and HF. No changes were made to her regimen. He reinforced her COPD Action plan.  [name] asked about palliative care today...when to refer. We discussed this and she was educated that palliative care is for sx management, not end of life care. Advised we can initiate that at any time the family or pt feels like they needs the extra support.  I will call again in one week. Reinforced following action plans for COPD and HF and seeking medical care/advice at the first sign of any problems to prevent complications and hospitalization.  [name]. Burgess Estelle, MSN, [facility] Gerontological Nurse Practitioner [facility] Care Management [phone]

## 2020-01-02 NOTE — Assessment & Plan Note (Addendum)
Quit smoking around 1997  - maint on anoro and prn xopenex neb as of ov 01/01/2020  - 01/01/2020  After extensive coaching inhaler device,  effectiveness =    90% with elipta    When respiratory symptoms begin or become refractory well after a patient reports complete smoking cessation,  Especially when this wasn't the case while they were smoking, a red flag is raised based on the work of Dr Primitivo Gauze which states:  if you quit smoking when your best day FEV1 is still well preserved it is highly unlikely you will progress to severe disease.  That is to say, once the smoking stops,  the symptoms should not suddenly erupt or markedly worsen.  If so, the differential diagnosis should include  obesity/deconditioning,  LPR/Reflux/Aspiration syndromes,  occult CHF, or  especially side effect of medications commonly used in this population.    I suspect a lot of her "copd "  Symptoms are actually  cardiac asthma but she is well compensated on anoro so ok to continue but should not be using saba as a maint in this setting  I spent extra time with pt today reviewing appropriate use of albuterol for prn use on exertion with the following points: 1) saba is for relief of sob that does not improve by walking a slower pace or resting but rather if the pt does not improve after trying this first. 2) If the pt is convinced, as many are, that saba helps recover from activity faster then it's easy to tell if this is the case by re-challenging : ie stop, take the inhaler, then p 5 minutes try the exact same activity (intensity of workload) that just caused the symptoms and see if they are substantially diminished or not after saba 3) if there is an activity that reproducibly causes the symptoms, try the saba 15 min before the activity on alternate days   If in fact the saba really does help, then fine to continue to use it prn but advised may need to look closer at the maintenance regimen being used to achieve better  control of airways disease with exertion.           Each maintenance medication was reviewed in detail including emphasizing most importantly the difference between maintenance and prns and under what circumstances the prns are to be triggered using an action plan format where appropriate.  Total time for H and P, chart review, counseling, teaching device and generating customized AVS unique to this office visit / charting = 60 min

## 2020-01-09 ENCOUNTER — Other Ambulatory Visit: Payer: Self-pay | Admitting: *Deleted

## 2020-01-09 NOTE — Patient Outreach (Signed)
Telephone outreach to pt daughter, [name], for report on her mother with multiple co-morbidies, hx COVID.  No answer this am, but was able to leave a message and requested a return call.  Eulah Pont. Myrtie Neither, MSN, GNP-BC Gerontological Nurse Practitioner [facility] Care Management [phone]  [name] returned my call and reported that her mother is holding her own. She has had to use her nebulizer a few times since we talked last week and she had good results, feeling less SOB and having less chest tightness.  Encouraged continued prn use of her nebs.  Her wt is stable at 111 today, never more than 115.  [name] verifies that her mother is following the Action Plans and she double checks to ensure that she has done so and what her general status is daily.  We decided to push out our next call for one month from now. All her care plan goals have been met at this time. If all is well next month I will reclassify her acuity to disease management and call her every 3 months. Ongoing goal with be to follow ACTION PLANS for COPD AND HF, CALL MD FOR SXS AND AVOID HOSPITALIZATIONS.   Eulah Pont. Myrtie Neither, MSN, [facility] Gerontological Nurse Practitioner [facility] Care Management [phone]

## 2020-01-10 ENCOUNTER — Ambulatory Visit (INDEPENDENT_AMBULATORY_CARE_PROVIDER_SITE_OTHER): Payer: Medicare Other | Admitting: *Deleted

## 2020-01-10 ENCOUNTER — Other Ambulatory Visit: Payer: Self-pay | Admitting: Cardiology

## 2020-01-10 ENCOUNTER — Other Ambulatory Visit: Payer: Self-pay

## 2020-01-10 ENCOUNTER — Telehealth: Payer: Self-pay | Admitting: Internal Medicine

## 2020-01-10 DIAGNOSIS — Z5181 Encounter for therapeutic drug level monitoring: Secondary | ICD-10-CM

## 2020-01-10 DIAGNOSIS — I359 Nonrheumatic aortic valve disorder, unspecified: Secondary | ICD-10-CM | POA: Diagnosis not present

## 2020-01-10 DIAGNOSIS — I6350 Cerebral infarction due to unspecified occlusion or stenosis of unspecified cerebral artery: Secondary | ICD-10-CM | POA: Diagnosis not present

## 2020-01-10 DIAGNOSIS — Z952 Presence of prosthetic heart valve: Secondary | ICD-10-CM

## 2020-01-10 LAB — POCT INR: INR: 2.4 (ref 2.0–3.0)

## 2020-01-10 NOTE — Telephone Encounter (Signed)
Called and spoke with pt's daughter [name] who is requesting to have a refill of pt's levalbuterol neb sol. [name] stated they have about 17 vials left of the rx and want to make sure they do not run out of it.  [name] did state that MW said for pt to try to not use it but if she needed to use it, she could use it up to 4 times a day.  Pt has been using it at least once daily.   Dr. Sherene Sires, please advise if you are okay with [place] refilling pt's med.

## 2020-01-10 NOTE — Patient Instructions (Signed)
Description   Continue taking 1/2 tablet daily except 1 tablet on Tuesdays and Saturdays.  Repeat INR in 3 weeks at [facility] office with Dr. Jens Som.  Coumadin Clinic [phone].

## 2020-01-11 MED ORDER — LEVALBUTEROL HCL 0.63 MG/3ML IN NEBU: 0.6300 mg | INHALATION_SOLUTION | RESPIRATORY_TRACT | 11 refills | Status: DC | PRN

## 2020-01-11 NOTE — Telephone Encounter (Signed)
Ok to refill/ should only use if can't catch her breath but if breathing worse than usual can use it up to every 4 hours  - it is not a maintenance "take once a day whether you need it or not" type of medicine, that's what I was attempting to establish with her.

## 2020-01-11 NOTE — Telephone Encounter (Signed)
Called and spoke with pt's daughter [name] letting her know that we were going to refill pt's levalbuterol neb sol and she verbalized understanding. Also while speaking with [name], she wanted to go ahead and make a 3 month follow up for pt at [facility] office so she had an appt scheduled for pt. appt has been made for pt to f/u with MW in 3 months. Nothing further needed.

## 2020-01-14 ENCOUNTER — Other Ambulatory Visit: Payer: Self-pay | Admitting: Internal Medicine

## 2020-01-14 ENCOUNTER — Telehealth: Payer: Self-pay | Admitting: Internal Medicine

## 2020-01-14 NOTE — Telephone Encounter (Signed)
Called and spoke with pt's daughter [name] who stated that pharmacy just called her stating that they did finally have neb sol ready for pickup. Nothing further needed.

## 2020-01-16 ENCOUNTER — Telehealth: Payer: Self-pay | Admitting: Internal Medicine

## 2020-01-16 ENCOUNTER — Telehealth: Payer: Self-pay | Admitting: Interventional Cardiology

## 2020-01-16 NOTE — Progress Notes (Signed)
Cardiology Office Note   Date:  01/17/2020   ID:  [name], DOB Jul 07, 1926, MRN [id number]  PCP:  Wandra Feinstein, MD  Cardiologist:  [name], MD EP: None  Chief Complaint  Patient presents with  . Congestive Heart Failure      History of Present Illness: [name] is a 86 y.o. female with PMH of CAD s/p CABG (LHC in 2011 revealed mild-moderate native disease with all grafts occluded), chronic combined CHF, carotid artery disease, aortic insufficiency s/p AVR, HTN, HLD, LBBB, COPD, CVA, and macular degeneration, who presents for the evaluation of SOB and weight gain.  She was last evaluated by cardiology at an outpatient visit with Dr. Jens Som 07/2019, at which time she reported SOB with more vigorous activities, otherwise no cardiac complaints. Given improvement in blood pressures she was started on losartan for conservative management of her cardiomyopathy. She was recommended to follow-up in 6 months. Her losartan was subsequently discontinued due to low blood pressures. Her last echo 01/2019 showed EF 25-30% (down from 50-55% 2019), G2DD, diffuse hypokinesis, mildly reduced RV systolic function with elevated pressures, severe LAE, mild RAE, moderate MR, moderate AI, and mildly elevated PA pressures. She has not had a recent ischemic evaluation as conservative management of her cardiomyopathy was pursued given patients age.   Since her last visit she was admitted to [facility] from 10/25/19-10/30/19 for management of COVID-19 infection. She is anticipating getting the COVID-19 vaccine once she is outside of the 90 day window after receiving the monoclonal antibody infusion, Bamlanivimab.   She presents today with her daughter with complaints of SOB and weight gain. Her daughter called the office yesterday to report weight gain of ~5lbs overnight, as well as increased SOB. She took additional lasix yesterday (40mg  total) with improvement in symptoms. Her  weight is back to baseline today. We discussed this was likely related to increased fluid due to her CHF and that they did the right thing by taking additional lasix. We discussed the importance of limiting salt intake. At her age, she would rather eat the foods she likes (occasional veggie sandwiches with pickles and a daily mountain dew) knowing she may need to take additional lasix here and there. She does not have excessive fluid intake. She denies chest pain, palpitations, dizziness, lightheadedness, syncope, orthopnea, or PND.    Past Medical History:  Diagnosis Date  . Anemia   . Anxiety   . Arthritis   . Blood transfusion without reported diagnosis   . Carotid artery stenosis    Without infarction  . Cataract   . Chronic combined systolic (congestive) and diastolic (congestive) heart failure (HCC)   . COPD (chronic obstructive pulmonary disease) (HCC)   . Coronary artery disease   . Heart valve replaced by other means   . Hypercholesterolemia    Pure  . Hypertension    Unspecified  . LBBB (left bundle branch block)   . Macular degeneration (senile) of retina, unspecified   . On home O2   . Postsurgical aortocoronary bypass status   . Stroke (HCC)   . Transient global amnesia   . Unspecified hereditary and idiopathic peripheral neuropathy   . Unspecified vitamin D deficiency     Past Surgical History:  Procedure Laterality Date  . AORTIC VALVE REPLACEMENT    . APPENDECTOMY    . CORONARY ARTERY BYPASS GRAFT    . HIP PINNING,CANNULATED Right 07/18/2018   Procedure: RIGHT CANNULATED HIP PINNING;  Surgeon:  Myrene Galas, MD;  Location: [facility] OR;  Service: Orthopedics;  Laterality: Right;  . TONSILLECTOMY       Current Outpatient Medications  Medication Sig Dispense Refill  . acetaminophen (TYLENOL) 325 MG tablet Take 650 mg by mouth every 4 (four) hours as needed.    . ALPRAZolam (XANAX) 0.25 MG tablet Take 0.25 mg by mouth at bedtime as needed for anxiety.    . Ascorbic  Acid (VITAMIN C) 500 MG CAPS Take 500 mg by mouth daily.    Marland Kitchen atorvastatin (LIPITOR) 40 MG tablet TAKE ONE TABLET BY MOUTH ONCE DAILY. 90 tablet 1  . baclofen (LIORESAL) 10 MG tablet Take 10 mg by mouth as needed for muscle spasms.    . carvedilol (COREG) 6.25 MG tablet Take 6.25 mg by mouth 2 (two) times daily with a meal.    . docusate sodium (COLACE) 100 MG capsule Take 100 mg by mouth at bedtime.     . donepezil (ARICEPT) 10 MG tablet TAKE ONE TABLET BY MOUTH ONCE DAILY. 30 tablet 0  . ergocalciferol (VITAMIN D2) 50000 UNITS capsule Take 50,000 Units by mouth every Tuesday.     . ferrous sulfate 325 (65 FE) MG EC tablet Take 325 mg by mouth 2 (two) times daily.    . furosemide (LASIX) 20 MG tablet TAKE ONE TABLET BY MOUTH ONCE DAILY.TAKE EXTRA IF NEEDED. 180 tablet 0  . hydrocortisone (ANUSOL-HC) 2.5 % rectal cream Place 1 application rectally 3 (three) times daily as needed for hemorrhoids or anal itching.    . levalbuterol (XOPENEX) 0.63 MG/3ML nebulizer solution USE 1 VIAL VIA NEBULIZER EVERY 8 HOURS AS NEEDED. 120 mL 1  . meclizine (ANTIVERT) 25 MG tablet Take 25 mg by mouth 3 (three) times daily as needed for dizziness.    . Melatonin 3 MG TABS Take 1 tablet by mouth at bedtime.    . multivitamin-lutein (OCUVITE-LUTEIN) CAPS capsule Take 1 capsule by mouth daily.    . pregabalin (LYRICA) 75 MG capsule Take 1 capsule (75 mg total) by mouth every other day. 15 capsule 5  . spironolactone (ALDACTONE) 25 MG tablet TAKE (1/2) TABLET BY MOUTH DAILY. 45 tablet 6  . traZODone (DESYREL) 50 MG tablet Take 0.5-1 tablets (25-50 mg total) by mouth at bedtime as needed for sleep. 30 tablet 3  . umeclidinium-vilanterol (ANORO ELLIPTA) 62.5-25 MCG/INH AEPB Inhale 1 puff into the lungs daily.     Marland Kitchen warfarin (COUMADIN) 2.5 MG tablet Take 1 tablet (2.5 mg total) by mouth every evening. DO NOT TAKE COUMADIN UNTIL YOUR INR IS DRAWN ON 12/17-AND YOU RECEIVE further instructions from Coumadin clinic.    Marland Kitchen  warfarin (COUMADIN) 5 MG tablet TAKE 1/2 TO 1 TABLET ONCE DAILY AS DIRECTED BY COUMADIN CLINIC. 45 tablet 0  . zinc sulfate 220 (50 Zn) MG capsule Take 220 mg by mouth daily.     No current facility-administered medications for this visit.    Allergies:   Codeine and Penicillins    Social History:  The patient  reports that she quit smoking about 28 years ago. Her smoking use included cigarettes. She smoked 0.50 packs per day. She has never used smokeless tobacco. She reports that she does not drink alcohol or use drugs.   Family History:  The patient's family history includes COPD in her sister; Heart attack in her father; Heart disease in her father; Hypertension in her mother; Neuropathy in her brother; Stroke in her father, mother, and sister.    ROS:  Please see the history of present illness.   Otherwise, review of systems are positive for none.   All other systems are reviewed and negative.    PHYSICAL EXAM: VS:  BP 110/68   Pulse (!) 56   Temp (!) 97.3 F (36.3 C)   Ht 5\' 2"  (1.575 m)   Wt 115 lb 12.8 oz (52.5 kg)   SpO2 99%   BMI 21.18 kg/m  , BMI Body mass index is 21.18 kg/m. GEN: Elderly female sitting in wheelchair with O2 via [place] in place HEENT: sclera anicteric Neck: no JVD, carotid bruits, or masses Cardiac: RRR; no murmurs, rubs, or gallops, no edema  Respiratory: clear to auscultation bilaterally, normal work of breathing, O2 via [place] in place GI: soft, nontender, nondistended, + BS MS: no deformity or atrophy Skin: warm and dry, no rash Neuro:  Strength and sensation are intact Psych: euthymic mood, full affect   EKG:  EKG is ordered today. The ekg ordered today demonstrates sinus rhythm bradycardia with rate 56 bpm, chronic LBBB; no change from previous   Recent Labs: 10/25/2019: B Natriuretic Peptide 1,998.0 10/30/2019: ALT 33; Hemoglobin 10.7; Platelets 147 12/24/2019: BUN 12; Creat 0.92; Potassium 4.3; Sodium 144    Lipid Panel    Component Value  Date/Time   CHOL 141 11/22/2019 0000   TRIG 66 11/22/2019 0000   HDL 55 11/22/2019 0000   CHOLHDL 2.7 07/03/2018 0700   VLDL 11 07/03/2018 0700   LDLCALC 73 11/22/2019 0000      Wt Readings from Last 3 Encounters:  01/17/20 115 lb 12.8 oz (52.5 kg)  01/01/20 114 lb (51.7 kg)  12/20/19 114 lb (51.7 kg)      Other studies Reviewed: Additional studies/ records that were reviewed today include:   Echocardiogram 01/2019: 1. The left ventricle has severely reduced systolic function, with an  ejection fraction of 25-30%. The cavity size was mildly dilated. Left  ventricular diastolic Doppler parameters are consistent with  pseudonormalization. Elevated mean left atrial  pressure Left ventricular diffuse hypokinesis.  2. The right ventricle has mildly reduced systolic function. The cavity  was normal. There is no increase in right ventricular wall thickness.  Right ventricular systolic pressure is mildly elevated with an estimated  pressure of 38.5 mmHg.  3. Left atrial size was severely dilated.  4. Right atrial size was mildly dilated.  5. The mitral valve is abnormal. Moderate thickening of the mitral valve  leaflet. There is moderate mitral annular calcification present. Mitral  valve regurgitation is moderate by color flow Doppler.  6. The tricuspid valve is grossly normal.  7. Aortic valve regurgitation is moderate by color flow Doppler.  8. Severe global reduction in LV systolic function; mild LVE; moderate  diastolic dysfunction; mild RV dysfunction; biatrial enlargement; s/p AVR  with mean gradient 15 mmHg and moderate AI; moderate MR; mild TR with  mildly elevated pulmonary pressure.     ASSESSMENT AND PLAN:  1. Acute on chronic combined CHF: symptoms resolved with extra lasix yesterday. Addition of ACEi/ARB/ARNI limited by blood pressure. Weight is back to baseline of 111lbs today (per home scale).  - Continue lasix 20mg  daily with plans to take additional 20mg   as needed for weight gain of 3lbs in 1 day or 5lbs in 1 week - Continue carvedilol and spironolactone - Encouraged a low sodium diet  2. CAD s/p CABG: no complaints of chest pain. Not on aspirin given need for coumadin. - Continue statin  3. Aortic insufficiency s/p  AVR: moderate AI noted on echo 01/2019. Not on aspirin given development of a thigh hematoma on coumadin and aspirin.  - Continue coumadin - follows with the coumadin clinic - next visit 02/05/20 - Continue SBE ppx  4. HTN:  BP 110/68 today - Continue carvedilol spironolactone   5. HLD: LDL 73 11/2019 - Continue atorvastatin  6. Carotid artery disease: moderate right and mild left ICA disease - Continue statin - Planned for routine monitoring 06/2020.    Current medicines are reviewed at length with the patient today.  The patient does not have concerns regarding medicines.  The following changes have been made:  no change  Labs/ tests ordered today include:   Orders Placed This Encounter  Procedures  . EKG 12-Lead     Disposition:   Patient wishes to FU with Dr. Stanford Breed as previously scheduled.  Signed, Abigail Butts, PA-C  01/17/2020 1:33 PM

## 2020-01-16 NOTE — Telephone Encounter (Signed)
Dr. Sherene Sires,  I called the [name] back (patient daughter) and she stated her mother has been having on average 3 breathing treatments a day. However, she was 111 lbs on Monday and 115 on Tuesday. She has been napping more and her O2 was 97% on 4L.   [name] was concerned because even with the adjustment in giving her the extra Lasix and Spironolactone as Dr. Jens Som directed, that it may be respiratory and was calling just in case. Because her mother also has congestive heart failure, so she was trying to cover the bases  I told her that if her mother's O2 was 97% on 4L but she has a 4 lb weight gain and congestive heart failure, to call Dr. Ludwig Clarks office to let them know as he is the one that issued the Lasix and Spironolactone. She stated her mother is only drinking about 3 ounces of water at a time when given. However, she is not drinking that much water and her urine output is not that much as well (due to not drinking enough water).  Dr. Sherene Sires based on this and her last visit with you 01/01/20, is there anything else that needed to be directed to the patient's daughter?

## 2020-01-16 NOTE — Telephone Encounter (Signed)
Called and spoke with pt's daughter [name] letting her know the info stated by MW. [name] verbalized understanding and stated pt has an appt with cards tomorrow 3/4. Nothing further needed.

## 2020-01-16 NOTE — Telephone Encounter (Signed)
New Message  Pt c/o Shortness Of Breath: STAT if SOB developed within the last 24 hours or pt is noticeably SOB on the phone  1. Are you currently SOB (can you hear that pt is SOB on the phone)? Yes  2. How long have you been experiencing SOB? A week, has had to take breathing treatments twice a day  3. Are you SOB when sitting or when up moving around? Sob on exertion  4. Are you currently experiencing any other symptoms? Gained 4 lbs overnight, BP 100/46 HR 74, SOB and difficulty Breathing.

## 2020-01-16 NOTE — Telephone Encounter (Signed)
Because she tends to retain C02 we are trying to keep sats in low 90s and may actually need to adjust the 02 flow down if getting much above 94% but I have no additional recs

## 2020-01-16 NOTE — Telephone Encounter (Signed)
Spoke to patient's daughter [name].She stated mother usually weighs 110 to 111 lbs.Stated this morning she weighed 115 lbs.She took Lasix 20 mg and she gave her a extra 20 mg.B/P 100/46 pulse 74.Stated she is sob.She saw Dr.Wert recently and he told her ok to take breathing treatments every 4 hours.Stated it don't seem to be helping.Appointment scheduled with Judy Pimple PA 3/4 at 10:15 am.Advised to go to ED if she becomes worse.

## 2020-01-17 ENCOUNTER — Ambulatory Visit (INDEPENDENT_AMBULATORY_CARE_PROVIDER_SITE_OTHER): Payer: Medicare Other | Admitting: Medical

## 2020-01-17 ENCOUNTER — Encounter: Payer: Self-pay | Admitting: Medical

## 2020-01-17 ENCOUNTER — Other Ambulatory Visit: Payer: Self-pay

## 2020-01-17 VITALS — BP 110/68 | HR 56 | Temp 97.3°F | Ht 62.0 in | Wt 115.8 lb

## 2020-01-17 DIAGNOSIS — I10 Essential (primary) hypertension: Secondary | ICD-10-CM | POA: Diagnosis not present

## 2020-01-17 DIAGNOSIS — I2510 Atherosclerotic heart disease of native coronary artery without angina pectoris: Secondary | ICD-10-CM

## 2020-01-17 DIAGNOSIS — E7800 Pure hypercholesterolemia, unspecified: Secondary | ICD-10-CM

## 2020-01-17 DIAGNOSIS — I6523 Occlusion and stenosis of bilateral carotid arteries: Secondary | ICD-10-CM

## 2020-01-17 DIAGNOSIS — Z952 Presence of prosthetic heart valve: Secondary | ICD-10-CM | POA: Diagnosis not present

## 2020-01-17 DIAGNOSIS — I5022 Chronic systolic (congestive) heart failure: Secondary | ICD-10-CM | POA: Diagnosis not present

## 2020-01-17 NOTE — Patient Instructions (Signed)
Medication Instructions:   You may take an extra dose of Lasix for weight gain of 3 lbs overnight and 5 lbs in a week  Your physician recommends that you continue on your current medications as directed. Please refer to the Current Medication list given to you today.  *If you need a refill on your cardiac medications before your next appointment, please call your pharmacy*  Lab Work: NONE ordered at this time of appointment   If you have labs (blood work) drawn today and your tests are completely normal, you will receive your results only by: Marland Kitchen MyChart Message (if you have MyChart) OR . A paper copy in the mail If you have any lab test that is abnormal or we need to change your treatment, we will call you to review the results.  Testing/Procedures: NONE ordered at this time of appointment   Follow-Up: At [facility], you and your health needs are our priority.  As part of our continuing mission to provide you with exceptional heart care, we have created designated Provider Care Teams.  These Care Teams include your primary Cardiologist (physician) and Advanced Practice Providers (APPs -  Physician Assistants and Nurse Practitioners) who all work together to provide you with the care you need, when you need it.  We recommend signing up for the patient portal called "MyChart".  Sign up information is provided on this After Visit Summary.  MyChart is used to connect with patients for Virtual Visits (Telemedicine).  Patients are able to view lab/test results, encounter notes, upcoming appointments, etc.  Non-urgent messages can be sent to your provider as well.   To learn more about what you can do with MyChart, go to [email or web address].    Your next appointment:   KEEP SCHEDULED APPOINTMENT    The format for your next appointment:   In Person  Provider:   [name], MD  Other Instructions

## 2020-01-17 NOTE — Telephone Encounter (Signed)
Agree with plan Runette Scifres  

## 2020-02-04 ENCOUNTER — Ambulatory Visit: Payer: Medicare Other

## 2020-02-04 ENCOUNTER — Ambulatory Visit: Payer: Medicare Other | Attending: Internal Medicine

## 2020-02-04 DIAGNOSIS — Z23 Encounter for immunization: Secondary | ICD-10-CM

## 2020-02-04 NOTE — Progress Notes (Signed)
   Covid-19 Vaccination Clinic  Name:  [name]    MRN: [id number] DOB: November 08, 1926  02/04/2020  [name] was observed post Covid-19 immunization for 15 minutes without incident. She was provided with Vaccine Information Sheet and instruction to access the V-Safe system.   [name] was instructed to call 911 with any severe reactions post vaccine: Marland Kitchen Difficulty breathing  . Swelling of face and throat  . A fast heartbeat  . A bad rash all over body  . Dizziness and weakness   Immunizations Administered    Name Date Dose VIS Date Route   Pfizer COVID-19 Vaccine 02/04/2020 11:34 AM 0.3 mL 10/26/2019 Intramuscular   Manufacturer: [employer], [employer]   Lot: [id number]   NDC: [address]-9

## 2020-02-05 ENCOUNTER — Inpatient Hospital Stay (HOSPITAL_COMMUNITY)
Admission: EM | Admit: 2020-02-05 | Discharge: 2020-02-09 | DRG: 291 | Disposition: A | Discharge status: Home Health | Payer: Medicare Other | Attending: Internal Medicine | Admitting: Internal Medicine

## 2020-02-05 ENCOUNTER — Ambulatory Visit: Payer: Medicare Other | Admitting: Cardiology

## 2020-02-05 ENCOUNTER — Emergency Department (HOSPITAL_COMMUNITY): Payer: Medicare Other

## 2020-02-05 ENCOUNTER — Other Ambulatory Visit: Payer: Self-pay | Admitting: Internal Medicine

## 2020-02-05 ENCOUNTER — Telehealth: Payer: Self-pay | Admitting: Family Medicine

## 2020-02-05 ENCOUNTER — Encounter (HOSPITAL_COMMUNITY): Payer: Self-pay | Admitting: Emergency Medicine

## 2020-02-05 ENCOUNTER — Other Ambulatory Visit: Payer: Self-pay

## 2020-02-05 DIAGNOSIS — I2510 Atherosclerotic heart disease of native coronary artery without angina pectoris: Secondary | ICD-10-CM | POA: Diagnosis present

## 2020-02-05 DIAGNOSIS — E785 Hyperlipidemia, unspecified: Secondary | ICD-10-CM | POA: Diagnosis present

## 2020-02-05 DIAGNOSIS — Z885 Allergy status to narcotic agent status: Secondary | ICD-10-CM

## 2020-02-05 DIAGNOSIS — I110 Hypertensive heart disease with heart failure: Secondary | ICD-10-CM | POA: Diagnosis not present

## 2020-02-05 DIAGNOSIS — Z20822 Contact with and (suspected) exposure to covid-19: Secondary | ICD-10-CM | POA: Diagnosis not present

## 2020-02-05 DIAGNOSIS — J441 Chronic obstructive pulmonary disease with (acute) exacerbation: Secondary | ICD-10-CM | POA: Diagnosis not present

## 2020-02-05 DIAGNOSIS — K5900 Constipation, unspecified: Secondary | ICD-10-CM | POA: Diagnosis present

## 2020-02-05 DIAGNOSIS — J9612 Chronic respiratory failure with hypercapnia: Secondary | ICD-10-CM | POA: Diagnosis present

## 2020-02-05 DIAGNOSIS — I447 Left bundle-branch block, unspecified: Secondary | ICD-10-CM | POA: Diagnosis present

## 2020-02-05 DIAGNOSIS — F0390 Unspecified dementia without behavioral disturbance: Secondary | ICD-10-CM | POA: Diagnosis present

## 2020-02-05 DIAGNOSIS — Z880 Allergy status to penicillin: Secondary | ICD-10-CM

## 2020-02-05 DIAGNOSIS — G609 Hereditary and idiopathic neuropathy, unspecified: Secondary | ICD-10-CM | POA: Diagnosis present

## 2020-02-05 DIAGNOSIS — Z87891 Personal history of nicotine dependence: Secondary | ICD-10-CM | POA: Diagnosis not present

## 2020-02-05 DIAGNOSIS — R069 Unspecified abnormalities of breathing: Secondary | ICD-10-CM | POA: Diagnosis not present

## 2020-02-05 DIAGNOSIS — E559 Vitamin D deficiency, unspecified: Secondary | ICD-10-CM | POA: Diagnosis present

## 2020-02-05 DIAGNOSIS — I130 Hypertensive heart and chronic kidney disease with heart failure and stage 1 through stage 4 chronic kidney disease, or unspecified chronic kidney disease: Principal | ICD-10-CM | POA: Diagnosis present

## 2020-02-05 DIAGNOSIS — Z951 Presence of aortocoronary bypass graft: Secondary | ICD-10-CM | POA: Diagnosis not present

## 2020-02-05 DIAGNOSIS — R0602 Shortness of breath: Secondary | ICD-10-CM

## 2020-02-05 DIAGNOSIS — Z823 Family history of stroke: Secondary | ICD-10-CM

## 2020-02-05 DIAGNOSIS — J9621 Acute and chronic respiratory failure with hypoxia: Secondary | ICD-10-CM

## 2020-02-05 DIAGNOSIS — I509 Heart failure, unspecified: Secondary | ICD-10-CM | POA: Diagnosis not present

## 2020-02-05 DIAGNOSIS — F419 Anxiety disorder, unspecified: Secondary | ICD-10-CM | POA: Diagnosis present

## 2020-02-05 DIAGNOSIS — Z7901 Long term (current) use of anticoagulants: Secondary | ICD-10-CM | POA: Diagnosis not present

## 2020-02-05 DIAGNOSIS — J439 Emphysema, unspecified: Secondary | ICD-10-CM | POA: Diagnosis present

## 2020-02-05 DIAGNOSIS — Z515 Encounter for palliative care: Secondary | ICD-10-CM | POA: Diagnosis not present

## 2020-02-05 DIAGNOSIS — I248 Other forms of acute ischemic heart disease: Secondary | ICD-10-CM | POA: Diagnosis present

## 2020-02-05 DIAGNOSIS — E876 Hypokalemia: Secondary | ICD-10-CM | POA: Diagnosis present

## 2020-02-05 DIAGNOSIS — N1832 Chronic kidney disease, stage 3b: Secondary | ICD-10-CM | POA: Diagnosis present

## 2020-02-05 DIAGNOSIS — J9611 Chronic respiratory failure with hypoxia: Secondary | ICD-10-CM | POA: Diagnosis present

## 2020-02-05 DIAGNOSIS — Z825 Family history of asthma and other chronic lower respiratory diseases: Secondary | ICD-10-CM

## 2020-02-05 DIAGNOSIS — R0603 Acute respiratory distress: Secondary | ICD-10-CM

## 2020-02-05 DIAGNOSIS — J9622 Acute and chronic respiratory failure with hypercapnia: Secondary | ICD-10-CM | POA: Diagnosis not present

## 2020-02-05 DIAGNOSIS — I5023 Acute on chronic systolic (congestive) heart failure: Secondary | ICD-10-CM | POA: Diagnosis present

## 2020-02-05 DIAGNOSIS — Z79890 Hormone replacement therapy (postmenopausal): Secondary | ICD-10-CM

## 2020-02-05 DIAGNOSIS — R4189 Other symptoms and signs involving cognitive functions and awareness: Secondary | ICD-10-CM | POA: Diagnosis present

## 2020-02-05 DIAGNOSIS — Z66 Do not resuscitate: Secondary | ICD-10-CM | POA: Diagnosis present

## 2020-02-05 DIAGNOSIS — Z79899 Other long term (current) drug therapy: Secondary | ICD-10-CM

## 2020-02-05 DIAGNOSIS — Z7189 Other specified counseling: Secondary | ICD-10-CM | POA: Diagnosis not present

## 2020-02-05 DIAGNOSIS — Z8616 Personal history of COVID-19: Secondary | ICD-10-CM

## 2020-02-05 DIAGNOSIS — Z952 Presence of prosthetic heart valve: Secondary | ICD-10-CM | POA: Diagnosis not present

## 2020-02-05 DIAGNOSIS — I10 Essential (primary) hypertension: Secondary | ICD-10-CM | POA: Diagnosis present

## 2020-02-05 DIAGNOSIS — K219 Gastro-esophageal reflux disease without esophagitis: Secondary | ICD-10-CM | POA: Diagnosis not present

## 2020-02-05 DIAGNOSIS — N183 Chronic kidney disease, stage 3 unspecified: Secondary | ICD-10-CM | POA: Diagnosis not present

## 2020-02-05 DIAGNOSIS — I5043 Acute on chronic combined systolic (congestive) and diastolic (congestive) heart failure: Secondary | ICD-10-CM

## 2020-02-05 DIAGNOSIS — Z8673 Personal history of transient ischemic attack (TIA), and cerebral infarction without residual deficits: Secondary | ICD-10-CM

## 2020-02-05 DIAGNOSIS — Z8249 Family history of ischemic heart disease and other diseases of the circulatory system: Secondary | ICD-10-CM

## 2020-02-05 LAB — CBC WITH DIFFERENTIAL/PLATELET
Abs Immature Granulocytes: 0.01 10*3/uL (ref 0.00–0.07)
Basophils Absolute: 0 10*3/uL (ref 0.0–0.1)
Basophils Relative: 0 %
Eosinophils Absolute: 0.4 10*3/uL (ref 0.0–0.5)
Eosinophils Relative: 6 %
HCT: 32.8 % — ABNORMAL LOW (ref 36.0–46.0)
Hemoglobin: 10 g/dL — ABNORMAL LOW (ref 12.0–15.0)
Immature Granulocytes: 0 %
Lymphocytes Relative: 9 %
Lymphs Abs: 0.7 10*3/uL (ref 0.7–4.0)
MCH: 30 pg (ref 26.0–34.0)
MCHC: 30.5 g/dL (ref 30.0–36.0)
MCV: 98.5 fL (ref 80.0–100.0)
Monocytes Absolute: 0.6 10*3/uL (ref 0.1–1.0)
Monocytes Relative: 8 %
Neutro Abs: 5.9 10*3/uL (ref 1.7–7.7)
Neutrophils Relative %: 77 %
Platelets: 225 10*3/uL (ref 150–400)
RBC: 3.33 MIL/uL — ABNORMAL LOW (ref 3.87–5.11)
RDW: 13.2 % (ref 11.5–15.5)
WBC: 7.6 10*3/uL (ref 4.0–10.5)
nRBC: 0 % (ref 0.0–0.2)

## 2020-02-05 LAB — BASIC METABOLIC PANEL
Anion gap: 11 (ref 5–15)
BUN: 14 mg/dL (ref 8–23)
CO2: 31 mmol/L (ref 22–32)
Calcium: 9 mg/dL (ref 8.9–10.3)
Chloride: 101 mmol/L (ref 98–111)
Creatinine, Ser: 0.88 mg/dL (ref 0.44–1.00)
GFR calc Af Amer: >60 mL/min (ref >60)
GFR calc non Af Amer: 57 mL/min — ABNORMAL LOW (ref >60)
Glucose, Bld: 102 mg/dL — ABNORMAL HIGH (ref 70–99)
Potassium: 3.1 mmol/L — ABNORMAL LOW (ref 3.5–5.1)
Sodium: 143 mmol/L (ref 135–145)

## 2020-02-05 LAB — BLOOD GAS, ARTERIAL
Acid-Base Excess: 8.4 mmol/L — ABNORMAL HIGH (ref 0.0–2.0)
Bicarbonate: 31.6 mmol/L — ABNORMAL HIGH (ref 20.0–28.0)
FIO2: 36
O2 Saturation: 97.6 %
Patient temperature: 37
pCO2 arterial: 53.5 mmHg — ABNORMAL HIGH (ref 32.0–48.0)
pH, Arterial: 7.409 (ref 7.350–7.450)
pO2, Arterial: 98 mmHg (ref 83.0–108.0)

## 2020-02-05 LAB — LACTIC ACID, PLASMA: Lactic Acid, Venous: 1 mmol/L (ref 0.5–1.9)

## 2020-02-05 LAB — PROTIME-INR
INR: 1.9 — ABNORMAL HIGH (ref 0.8–1.2)
Prothrombin Time: 22.1 seconds — ABNORMAL HIGH (ref 11.4–15.2)

## 2020-02-05 LAB — TROPONIN I (HIGH SENSITIVITY)
Troponin I (High Sensitivity): 49 ng/L — ABNORMAL HIGH (ref <18)
Troponin I (High Sensitivity): 59 ng/L — ABNORMAL HIGH (ref <18)

## 2020-02-05 LAB — MAGNESIUM: Magnesium: 1.9 mg/dL (ref 1.7–2.4)

## 2020-02-05 LAB — BRAIN NATRIURETIC PEPTIDE: B Natriuretic Peptide: 1712 pg/mL — ABNORMAL HIGH (ref 0.0–100.0)

## 2020-02-05 LAB — SARS CORONAVIRUS 2 (TAT 6-24 HRS): SARS Coronavirus 2: NEGATIVE

## 2020-02-05 MED ORDER — FUROSEMIDE 10 MG/ML IJ SOLN
20.0000 mg | Freq: Two times a day (BID) | INTRAMUSCULAR | Status: DC
  Administered 2020-02-05 – 2020-02-06 (×4): 20 mg via INTRAVENOUS
  Filled 2020-02-05 (×5): qty 2

## 2020-02-05 MED ORDER — POTASSIUM CHLORIDE CRYS ER 20 MEQ PO TBCR
20.0000 meq | EXTENDED_RELEASE_TABLET | Freq: Once | ORAL | Status: AC
  Administered 2020-02-05: 09:00:00 20 meq via ORAL
  Filled 2020-02-05: qty 1

## 2020-02-05 MED ORDER — DONEPEZIL HCL 5 MG PO TABS
10.0000 mg | ORAL_TABLET | Freq: Every day | ORAL | Status: DC
  Administered 2020-02-05 – 2020-02-09 (×5): 10 mg via ORAL
  Filled 2020-02-05 (×3): qty 2
  Filled 2020-02-05: qty 1
  Filled 2020-02-05: qty 2
  Filled 2020-02-05: qty 1
  Filled 2020-02-05 (×2): qty 2

## 2020-02-05 MED ORDER — ONDANSETRON HCL 4 MG/2ML IJ SOLN: 4.0000 mg | Freq: Four times a day (QID) | INTRAMUSCULAR | Status: DC | PRN

## 2020-02-05 MED ORDER — ATORVASTATIN CALCIUM 40 MG PO TABS
40.0000 mg | ORAL_TABLET | Freq: Every day | ORAL | Status: DC
  Administered 2020-02-05 – 2020-02-09 (×5): 40 mg via ORAL
  Filled 2020-02-05 (×5): qty 1

## 2020-02-05 MED ORDER — POTASSIUM CHLORIDE CRYS ER 20 MEQ PO TBCR
20.0000 meq | EXTENDED_RELEASE_TABLET | Freq: Every day | ORAL | Status: DC
  Administered 2020-02-05 – 2020-02-09 (×5): 20 meq via ORAL
  Filled 2020-02-05 (×5): qty 1

## 2020-02-05 MED ORDER — CARVEDILOL 3.125 MG PO TABS
6.2500 mg | ORAL_TABLET | Freq: Two times a day (BID) | ORAL | Status: DC
  Administered 2020-02-05 – 2020-02-09 (×8): 6.25 mg via ORAL
  Filled 2020-02-05 (×9): qty 2

## 2020-02-05 MED ORDER — PREGABALIN 75 MG PO CAPS
75.0000 mg | ORAL_CAPSULE | ORAL | Status: DC
  Administered 2020-02-05 – 2020-02-09 (×3): 75 mg via ORAL
  Filled 2020-02-05 (×4): qty 1

## 2020-02-05 MED ORDER — SODIUM CHLORIDE 0.9 % IV SOLN: 250.0000 mL | INTRAVENOUS | Status: DC | PRN

## 2020-02-05 MED ORDER — ALPRAZOLAM 0.25 MG PO TABS
0.2500 mg | ORAL_TABLET | Freq: Every evening | ORAL | Status: DC | PRN
  Administered 2020-02-07: 03:00:00 0.25 mg via ORAL
  Filled 2020-02-05: qty 1

## 2020-02-05 MED ORDER — ALBUTEROL SULFATE HFA 108 (90 BASE) MCG/ACT IN AERS
8.0000 | INHALATION_SPRAY | RESPIRATORY_TRACT | Status: AC
  Administered 2020-02-05: 8 via RESPIRATORY_TRACT
  Filled 2020-02-05: qty 6.7

## 2020-02-05 MED ORDER — ORAL CARE MOUTH RINSE
15.0000 mL | Freq: Two times a day (BID) | OROMUCOSAL | Status: DC
  Administered 2020-02-05 – 2020-02-09 (×8): 15 mL via OROMUCOSAL

## 2020-02-05 MED ORDER — FUROSEMIDE 10 MG/ML IJ SOLN
40.0000 mg | Freq: Once | INTRAMUSCULAR | Status: AC
  Administered 2020-02-05: 08:00:00 40 mg via INTRAVENOUS
  Filled 2020-02-05: qty 4

## 2020-02-05 MED ORDER — WARFARIN - PHARMACIST DOSING INPATIENT: Freq: Every day | Status: DC

## 2020-02-05 MED ORDER — LEVALBUTEROL TARTRATE 45 MCG/ACT IN AERO: 1.0000 | INHALATION_SPRAY | Freq: Three times a day (TID) | RESPIRATORY_TRACT | Status: DC | PRN

## 2020-02-05 MED ORDER — SODIUM CHLORIDE 0.9% FLUSH
3.0000 mL | Freq: Two times a day (BID) | INTRAVENOUS | Status: DC
  Administered 2020-02-05 – 2020-02-08 (×7): 3 mL via INTRAVENOUS

## 2020-02-05 MED ORDER — TRAZODONE HCL 50 MG PO TABS: 25.0000 mg | ORAL_TABLET | Freq: Every evening | ORAL | Status: DC | PRN

## 2020-02-05 MED ORDER — ACETAMINOPHEN 325 MG PO TABS: 650.0000 mg | ORAL_TABLET | Freq: Four times a day (QID) | ORAL | Status: DC | PRN

## 2020-02-05 MED ORDER — ACETAMINOPHEN 325 MG PO TABS: 650.0000 mg | ORAL_TABLET | ORAL | Status: DC | PRN

## 2020-02-05 MED ORDER — WARFARIN SODIUM 5 MG PO TABS
5.0000 mg | ORAL_TABLET | Freq: Once | ORAL | Status: AC
  Administered 2020-02-05: 5 mg via ORAL
  Filled 2020-02-05: qty 1

## 2020-02-05 MED ORDER — UMECLIDINIUM-VILANTEROL 62.5-25 MCG/INH IN AEPB
1.0000 | INHALATION_SPRAY | Freq: Every day | RESPIRATORY_TRACT | Status: DC
  Administered 2020-02-06 – 2020-02-07 (×2): 1 via RESPIRATORY_TRACT
  Filled 2020-02-05: qty 14

## 2020-02-05 MED ORDER — METHYLPREDNISOLONE SODIUM SUCC 125 MG IJ SOLR
125.0000 mg | Freq: Once | INTRAMUSCULAR | Status: AC
  Administered 2020-02-05: 125 mg via INTRAVENOUS
  Filled 2020-02-05: qty 2

## 2020-02-05 MED ORDER — MECLIZINE HCL 12.5 MG PO TABS: 25.0000 mg | ORAL_TABLET | Freq: Three times a day (TID) | ORAL | Status: DC | PRN

## 2020-02-05 MED ORDER — ALPRAZOLAM 0.5 MG PO TABS
0.2500 mg | ORAL_TABLET | Freq: Once | ORAL | Status: AC
  Administered 2020-02-05: 0.25 mg via ORAL
  Filled 2020-02-05: qty 1

## 2020-02-05 MED ORDER — ALBUTEROL SULFATE HFA 108 (90 BASE) MCG/ACT IN AERS
1.0000 | INHALATION_SPRAY | RESPIRATORY_TRACT | Status: DC | PRN
  Administered 2020-02-05: 8 via RESPIRATORY_TRACT
  Administered 2020-02-07: 1 via RESPIRATORY_TRACT

## 2020-02-05 MED ORDER — SODIUM CHLORIDE 0.9% FLUSH: 3.0000 mL | INTRAVENOUS | Status: DC | PRN

## 2020-02-05 MED ORDER — METHYLPREDNISOLONE SODIUM SUCC 40 MG IJ SOLR
40.0000 mg | Freq: Two times a day (BID) | INTRAMUSCULAR | Status: DC
  Administered 2020-02-05 – 2020-02-06 (×2): 40 mg via INTRAVENOUS
  Filled 2020-02-05 (×2): qty 1

## 2020-02-05 NOTE — Progress Notes (Signed)
Received call from RN that patient was tachyneic on arrival from ED. O2 sats 84% on 2 lpm via [place]. Patient visibly dyspneic, sleepy, RR 35, Rapid response called. MD paged. Patient on O2 at 4 lpm chronically at home. O2 increased to 4 lpm. O2 sats 96% on 4 lpm. Dr Gwenlyn Perking arrived to assess patient. RT at bedside with treatment. Continue current orders and monitor per MD. Yellow MEWS guidelines in place.

## 2020-02-05 NOTE — Progress Notes (Signed)
Rapid Response Event Note  Overview:  Patient is new admit to floor from ED today. Upon floor RN assessment, pt noted to be tachypneic at 35 and increased WOB. Pt is alert, oriented x 4. Pt with h/o COPD and CHF. Floor RN called Rapid response upon assessment findings.   Initial Focused Assessment: VS: HR 77, BP 170/61, RR 35, O2 90 on 2 L [place] Noted increased WOB with tripoding, tachypnea. Anxious appearing, pale.   Interventions:  Rapid response RN, Dr Gwenlyn Perking, Nursing director, and RT to bedside.   Patient on 4L [place] O2 Chronically, but brought to floor from ED on 2 liters. Oxygen increased to 4 LPM, with good response of O2 sat increasing to 95%. Exertion from moving patient from stretcher to bed may have also exacerbated symptoms  Albuterol inhaler given by RT.  Second IV site started in left forearm.    Plan of Care (if not transferred): Patient is to remain on unit 300 at this time. WOB and tachypnea has improved some with interventions. Pt to have more frequent assessments for change in condition. Dr Gwenlyn Perking agrees there is not a seen benefit for transfer to ICU at this time, appropriate monitoring and interventions can be managed on current unit.    Marisa Sprinkles, RN

## 2020-02-05 NOTE — ED Provider Notes (Addendum)
[facility] EMERGENCY DEPARTMENT Provider Note   CSN: [id number] Arrival date & time: 02/05/20  9678     History Chief Complaint  Patient presents with  . Shortness of Breath    [name] is a 86 y.o. female.  Patient presents to the emergency department for shortness of breath.  Patient comes to the ER from home.  Patient has a history of CHF and COPD.  She has had progressive worsening shortness of breath over a period of weeks.  She has previously had Covid and received her first Covid injection yesterday.  She complains of pain at the injection site, no other pain complaints.  She denies cough and fever.        Past Medical History:  Diagnosis Date  . Anemia   . Anxiety   . Arthritis   . Blood transfusion without reported diagnosis   . Carotid artery stenosis    Without infarction  . Cataract   . Chronic combined systolic (congestive) and diastolic (congestive) heart failure (HCC)   . COPD (chronic obstructive pulmonary disease) (HCC)   . Coronary artery disease   . Heart valve replaced by other means   . Hypercholesterolemia    Pure  . Hypertension    Unspecified  . LBBB (left bundle branch block)   . Macular degeneration (senile) of retina, unspecified   . On home O2   . Postsurgical aortocoronary bypass status   . Stroke (HCC)   . Transient global amnesia   . Unspecified hereditary and idiopathic peripheral neuropathy   . Unspecified vitamin D deficiency     Patient Active Problem List   Diagnosis Date Noted  . Chronic respiratory failure with hypoxia and hypercapnia (HCC) 01/02/2020  . Disorder of arteries and arterioles, unspecified (HCC) 01/01/2020  . Cough 12/24/2019  . History of COVID-19 11/27/2019  . Vitamin D deficiency 11/19/2019  . Localized swelling of left foot 11/19/2019  . Insomnia 11/19/2019  . Pneumonia due to COVID-19 virus 10/25/2019  . Hematoma of thigh, right, initial encounter 07/23/2018  . Chronic combined systolic and  diastolic CHF (congestive heart failure) (HCC) 07/23/2018  . Supratherapeutic INR 07/23/2018  . Acute blood loss anemia 07/23/2018  . CKD (chronic kidney disease) stage 3, GFR 30-59 ml/min 07/23/2018  . COPD GOLD B symptoms/risk  07/23/2018  . Hematoma of right thigh 07/23/2018  . Pressure injury of skin 07/23/2018  . Hip fracture (HCC) 07/16/2018  . COPD exacerbation (HCC) 07/01/2018  . Right hip pain 07/01/2018  . Chronic respiratory failure with hypoxia, on home O2 therapy (HCC) 07/01/2018  . CHF (congestive heart failure) (HCC) 06/28/2018  . Acute diastolic heart failure (HCC) 06/27/2018  . Anemia 06/27/2018  . Congestive dilated cardiomyopathy (HCC) 09/29/2015  . Encounter for therapeutic drug monitoring 12/12/2013  . Transient confusion 04/16/2013  . Congestive heart failure (HCC) 11/28/2012  . Dehydration 11/27/2012  . Dementia (HCC) 11/27/2012  . Pneumonia 11/24/2012  . Aortic valve disorder 01/13/2011  . Carotid artery stenosis 10/13/2009  . S/P AVR (aortic valve replacement) 10/13/2009  . Hyperlipidemia 03/06/2009  . Essential hypertension 03/06/2009  . CAD, ARTERY BYPASS GRAFT 03/06/2009  . LBBB 03/06/2009  . Cerebral artery occlusion with cerebral infarction (HCC) 03/06/2009    Past Surgical History:  Procedure Laterality Date  . AORTIC VALVE REPLACEMENT    . APPENDECTOMY    . CORONARY ARTERY BYPASS GRAFT    . HIP PINNING,CANNULATED Right 07/18/2018   Procedure: RIGHT CANNULATED HIP PINNING;  Surgeon: Myrene Galas, MD;  Location: [facility] OR;  Service: Orthopedics;  Laterality: Right;  . TONSILLECTOMY       OB History   No obstetric history on file.     Family History  Problem Relation Age of Onset  . Heart attack Father   . Heart disease Father   . Stroke Father   . Stroke Sister   . Hypertension Mother   . Stroke Mother   . Neuropathy Brother   . COPD Sister     Social History   Tobacco Use  . Smoking status: Former Smoker    Packs/day: 0.50     Types: Cigarettes    Quit date: 11/30/1991    Years since quitting: 28.2  . Smokeless tobacco: Never Used  . Tobacco comment: Tobacco use-no  Substance Use Topics  . Alcohol use: No  . Drug use: No    Home Medications Prior to Admission medications   Medication Sig Start Date End Date Taking? Authorizing Provider  acetaminophen (TYLENOL) 325 MG tablet Take 650 mg by mouth every 4 (four) hours as needed.    [provider]  ALPRAZolam Prudy Feeler) 0.25 MG tablet Take 0.25 mg by mouth at bedtime as needed for anxiety.    [provider]  Ascorbic Acid (VITAMIN C) 500 MG CAPS Take 500 mg by mouth daily.    [provider]  atorvastatin (LIPITOR) 40 MG tablet TAKE ONE TABLET BY MOUTH ONCE DAILY. 01/01/20   Lewayne Bunting, MD  baclofen (LIORESAL) 10 MG tablet Take 10 mg by mouth as needed for muscle spasms.    [provider]  carvedilol (COREG) 6.25 MG tablet Take 6.25 mg by mouth 2 (two) times daily with a meal.    [provider]  docusate sodium (COLACE) 100 MG capsule Take 100 mg by mouth at bedtime.     [provider]  donepezil (ARICEPT) 10 MG tablet TAKE ONE TABLET BY MOUTH ONCE DAILY. 08/12/15   Penumalli, Glenford Bayley, MD  ergocalciferol (VITAMIN D2) 50000 UNITS capsule Take 50,000 Units by mouth every Tuesday.     [provider]  ferrous sulfate 325 (65 FE) MG EC tablet Take 325 mg by mouth 2 (two) times daily.    [provider]  furosemide (LASIX) 20 MG tablet TAKE ONE TABLET BY MOUTH ONCE DAILY.TAKE EXTRA IF NEEDED. 09/24/19   Lewayne Bunting, MD  hydrocortisone (ANUSOL-HC) 2.5 % rectal cream Place 1 application rectally 3 (three) times daily as needed for hemorrhoids or anal itching.    [provider]  levalbuterol Pauline Aus) 0.63 MG/3ML nebulizer solution USE 1 VIAL VIA NEBULIZER EVERY 8 HOURS AS NEEDED. 01/14/20   Nyoka Cowden, MD  meclizine (ANTIVERT) 25 MG tablet Take 25 mg by mouth 3 (three) times  daily as needed for dizziness.    [provider]  Melatonin 3 MG TABS Take 1 tablet by mouth at bedtime.    [provider]  multivitamin-lutein (OCUVITE-LUTEIN) CAPS capsule Take 1 capsule by mouth daily.    [provider]  pregabalin (LYRICA) 75 MG capsule Take 1 capsule (75 mg total) by mouth every other day. 10/23/19   Darreld Mclean, MD  spironolactone (ALDACTONE) 25 MG tablet TAKE (1/2) TABLET BY MOUTH DAILY. 12/24/19   Lewayne Bunting, MD  traZODone (DESYREL) 50 MG tablet Take 0.5-1 tablets (25-50 mg total) by mouth at bedtime as needed for sleep. 11/19/19   Corum, Minerva Fester, MD  umeclidinium-vilanterol (ANORO ELLIPTA) 62.5-25 MCG/INH AEPB Inhale 1 puff into  the lungs daily.     [provider]  warfarin (COUMADIN) 2.5 MG tablet Take 1 tablet (2.5 mg total) by mouth every evening. DO NOT TAKE COUMADIN UNTIL YOUR INR IS DRAWN ON 12/17-AND YOU RECEIVE further instructions from Coumadin clinic. 11/01/19   Ghimire, Werner Lean, MD  warfarin (COUMADIN) 5 MG tablet TAKE 1/2 TO 1 TABLET ONCE DAILY AS DIRECTED BY COUMADIN CLINIC. 01/10/20   Lewayne Bunting, MD  zinc sulfate 220 (50 Zn) MG capsule Take 220 mg by mouth daily.    [provider]    Allergies    Codeine and Penicillins  Review of Systems   Review of Systems  Respiratory: Positive for shortness of breath.   All other systems reviewed and are negative.   Physical Exam Updated Vital Signs BP (!) 172/66   Pulse 84   Resp (!) 27   Wt 52.5 kg   SpO2 100%   BMI 21.17 kg/m   Physical Exam Vitals and nursing note reviewed.  Constitutional:      General: She is not in acute distress.    Appearance: Normal appearance. She is well-developed.  HENT:     Head: Normocephalic and atraumatic.     Right Ear: Hearing normal.     Left Ear: Hearing normal.     Nose: Nose normal.  Eyes:     Conjunctiva/sclera: Conjunctivae normal.     Pupils: Pupils are equal, round, and reactive to light.    Cardiovascular:     Rate and Rhythm: Regular rhythm.     Heart sounds: No murmur. No friction rub.     Comments: Mechanical valve heart sounds Pulmonary:     Effort: Tachypnea and accessory muscle usage present.     Breath sounds: Decreased breath sounds present.  Chest:     Chest wall: No tenderness.  Abdominal:     General: Bowel sounds are normal.     Palpations: Abdomen is soft.     Tenderness: There is no abdominal tenderness. There is no guarding or rebound. Negative signs include Murphy's sign and McBurney's sign.     Hernia: No hernia is present.  Musculoskeletal:        General: Normal range of motion.     Cervical back: Normal range of motion and neck supple.     Right lower leg: Edema present.     Left lower leg: Edema present.  Skin:    General: Skin is warm and dry.     Findings: No rash.  Neurological:     Mental Status: She is alert and oriented to person, place, and time.     GCS: GCS eye subscore is 4. GCS verbal subscore is 5. GCS motor subscore is 6.     Cranial Nerves: No cranial nerve deficit.     Sensory: No sensory deficit.     Coordination: Coordination normal.  Psychiatric:        Speech: Speech normal.        Behavior: Behavior normal.        Thought Content: Thought content normal.     ED Results / Procedures / Treatments   Labs (all labs ordered are listed, but only abnormal results are displayed) Labs Reviewed - No data to display  EKG EKG Interpretation  Date/Time:  Tuesday February 05 2020 06:14:55 EDT Ventricular Rate:  80 PR Interval:    QRS Duration: 180 QT Interval:  417 QTC Calculation: 482 R Axis:   88 Text Interpretation: Sinus rhythm  Probable left ventricular hypertrophy Left bundle branch block No significant change since last tracing Confirmed by Orpah Greek [phone]) on 02/05/2020 6:24:22 AM   Radiology No results found.  Procedures Procedures (including critical care time)  Medications Ordered in  ED Medications - No data to display  ED Course  I have reviewed the triage vital signs and the nursing notes.  Pertinent labs & imaging results that were available during my care of the patient were reviewed by me and considered in my medical decision making (see chart for details).    MDM Rules/Calculators/A&P                      Patient presents to the emergency department for evaluation of difficulty breathing.  Patient has a history of CHF (EF 20-25%) as well as COPD (chronic O2 dependent).  She does not have any active Covid symptoms and previously had Covid several months ago.  She has trace edema of both legs, left greater than right.  She has an S4 gallop.  Symptoms possibly secondary to CHF but COPD a possibility as well.  Will perform work-up to help delineate.  We will start oncoming ER physician.  Anticipate need for admission.  Final Clinical Impression(s) / ED Diagnoses Final diagnoses:  Shortness of breath    Rx / DC Orders ED Discharge Orders    None       Rollan Roger, Gwenyth Allegra, MD 02/05/20 5003    Orpah Greek, MD 02/05/20 [phone]

## 2020-02-05 NOTE — H&P (Signed)
History and Physical    [name] VHQ:[id number] DOB: [birth year] DOA: 02/05/2020  Referring MD/NP/PA: Dr. Melina Copa PCP: Maryruth Hancock, MD  Patient coming from: Home  Chief Complaint: Worsening shortness of breath.  HPI: [name] is a 86 y.o. female with past medical history significant for chronic combined systolic and diastolic heart failure, COPD, hypertension, mechanical valve replacement (chronically on Coumadin), hypercholesterolemia, anxiety, chronic respiratory failure using 4 L nasal cannula at baseline and vitamin D deficiency; who presented to the hospital secondary to worsening shortness of breath.  Patient reports that for the last 3-4 days she had noticed increase in her shortness of breath initially with exertion and then also at rest; patient expressed some orthopnea and increased swelling in her lower extremity as well.  Very little variation on her weight and expressed good medication compliance.  Patient denies fever, chills, chest pain, nausea, vomiting, abdominal pain, dysuria, hematuria, melena, hematochezia, headaches, blurred vision or focal weakness.  Of note, patient has a prior history of positive Covid infection in December 2020.  She expressed full recovery from that.  In the ED work-up demonstrated chest x-ray 8 with emphysema vascular congestion; elevated BNP and mild elevation in troponins (most likely demand ischemia); EKG without acute ischemic changes and overall good response to IV Lasix and the steroids given in the ED.  Patient is still demonstrating labored breathing with minimal exertion, mild difficulty speaking in full sentences and tachypnea.  [facility] contacted to admit patient for further evaluation and management of COPD and CHF exacerbation.  Past Medical/Surgical History: Past Medical History:  Diagnosis Date  . Anemia   . Anxiety   . Arthritis   . Blood transfusion without reported diagnosis   . Carotid artery stenosis    Without  infarction  . Cataract   . Chronic combined systolic (congestive) and diastolic (congestive) heart failure ([place])   . COPD (chronic obstructive pulmonary disease) ([place])   . Coronary artery disease   . Heart valve replaced by other means   . Hypercholesterolemia    Pure  . Hypertension    Unspecified  . LBBB (left bundle branch block)   . Macular degeneration (senile) of retina, unspecified   . On home O2   . Postsurgical aortocoronary bypass status   . Stroke ([place])   . Transient global amnesia   . Unspecified hereditary and idiopathic peripheral neuropathy   . Unspecified vitamin D deficiency     Past Surgical History:  Procedure Laterality Date  . AORTIC VALVE REPLACEMENT    . APPENDECTOMY    . CORONARY ARTERY BYPASS GRAFT    . HIP PINNING,CANNULATED Right 07/18/2018   Procedure: RIGHT CANNULATED HIP PINNING;  Surgeon: Altamese Blue Berry [place], MD;  Location: [facility];  Service: Orthopedics;  Laterality: Right;  . TONSILLECTOMY      Social History:  reports that she quit smoking about 28 years ago. Her smoking use included cigarettes. She smoked 0.50 packs per day. She has never used smokeless tobacco. She reports that she does not drink alcohol or use drugs.  Allergies: Allergies  Allergen Reactions  . Codeine Nausea And Vomiting  . Penicillins Other (See Comments)    Unknown reaction - listed on MAR Has patient had a PCN reaction causing immediate rash, facial/tongue/throat swelling, SOB or lightheadedness with hypotension: Unknown Has patient had a PCN reaction causing severe rash involving mucus membranes or skin necrosis: Unknown Has patient had a PCN reaction that required hospitalization: Unknown Has patient had a PCN reaction  occurring within the last 10 years: Unknown If all of the above answers are "NO", then may proceed with Cephalosporin use.    Family History:  Family History  Problem Relation Age of Onset  . Heart attack Father   . Heart disease Father   . Stroke  Father   . Stroke Sister   . Hypertension Mother   . Stroke Mother   . Neuropathy Brother   . COPD Sister     Prior to Admission medications   Medication Sig Start Date End Date Taking? Authorizing Provider  acetaminophen (TYLENOL) 325 MG tablet Take 650 mg by mouth every 4 (four) hours as needed.    [provider]  ALPRAZolam Prudy Feeler) 0.25 MG tablet Take 0.25 mg by mouth at bedtime as needed for anxiety.    [provider]  Ascorbic Acid (VITAMIN C) 500 MG CAPS Take 500 mg by mouth daily.    [provider]  atorvastatin (LIPITOR) 40 MG tablet TAKE ONE TABLET BY MOUTH ONCE DAILY. 01/01/20   Lewayne Bunting, MD  baclofen (LIORESAL) 10 MG tablet Take 10 mg by mouth as needed for muscle spasms.    [provider]  carvedilol (COREG) 6.25 MG tablet Take 6.25 mg by mouth 2 (two) times daily with a meal.    [provider]  docusate sodium (COLACE) 100 MG capsule Take 100 mg by mouth at bedtime.     [provider]  donepezil (ARICEPT) 10 MG tablet TAKE ONE TABLET BY MOUTH ONCE DAILY. 08/12/15   Penumalli, Glenford Bayley, MD  ergocalciferol (VITAMIN D2) 50000 UNITS capsule Take 50,000 Units by mouth every Tuesday.     [provider]  ferrous sulfate 325 (65 FE) MG EC tablet Take 325 mg by mouth 2 (two) times daily.    [provider]  furosemide (LASIX) 20 MG tablet TAKE ONE TABLET BY MOUTH ONCE DAILY.TAKE EXTRA IF NEEDED. 09/24/19   Lewayne Bunting, MD  hydrocortisone (ANUSOL-HC) 2.5 % rectal cream Place 1 application rectally 3 (three) times daily as needed for hemorrhoids or anal itching.    [provider]  levalbuterol Pauline Aus) 0.63 MG/3ML nebulizer solution USE 1 VIAL VIA NEBULIZER EVERY 8 HOURS AS NEEDED. 01/14/20   Nyoka Cowden, MD  meclizine (ANTIVERT) 25 MG tablet Take 25 mg by mouth 3 (three) times daily as needed for dizziness.    [provider]  Melatonin 3 MG TABS Take 1 tablet by mouth at  bedtime.    [provider]  multivitamin-lutein (OCUVITE-LUTEIN) CAPS capsule Take 1 capsule by mouth daily.    [provider]  pregabalin (LYRICA) 75 MG capsule Take 1 capsule (75 mg total) by mouth every other day. 10/23/19   Darreld Mclean, MD  spironolactone (ALDACTONE) 25 MG tablet TAKE (1/2) TABLET BY MOUTH DAILY. 12/24/19   Lewayne Bunting, MD  traZODone (DESYREL) 50 MG tablet Take 0.5-1 tablets (25-50 mg total) by mouth at bedtime as needed for sleep. 11/19/19   Corum, Minerva Fester, MD  umeclidinium-vilanterol (ANORO ELLIPTA) 62.5-25 MCG/INH AEPB Inhale 1 puff into the lungs daily.     [provider]  warfarin (COUMADIN) 2.5 MG tablet Take 1 tablet (2.5 mg total) by mouth every evening. DO NOT TAKE COUMADIN UNTIL YOUR INR IS DRAWN ON 12/17-AND YOU RECEIVE further instructions from Coumadin clinic. 11/01/19   Maretta Bees, MD  warfarin (COUMADIN) 5 MG tablet TAKE 1/2 TO 1 TABLET ONCE DAILY AS DIRECTED BY COUMADIN CLINIC. 01/10/20  Lewayne Bunting, MD  zinc sulfate 220 (50 Zn) MG capsule Take 220 mg by mouth daily.    [provider]    Review of Systems: Negative except as otherwise mentioned in HPI.  Physical Exam: Vitals:   02/05/20 0620 02/05/20 0630 02/05/20 0730 02/05/20 0800  BP: (!) 172/66 (!) 167/69 (!) 173/77 (!) 179/69  Pulse:  79 89   Resp:  (!) 23    SpO2:  100% 97%   Weight:       Constitutional: Reporting no chest pain, increased labored breathing and shortness of breath with minimal exertion; good oxygen saturation on 4 L nasal cannula.  Afebrile.  Difficulty speaking in full sentences appreciated during interview. Eyes: PERRL, lids and conjunctivae normal, no icterus, no nystagmus. ENMT: Mucous membranes are moist. Posterior pharynx clear of any exudate or lesions.  Neck: normal, supple, no masses, no thyromegaly, no JVD. Respiratory: Positive rhonchi bilaterally, bibasilar crackles appreciated on exam along with expiratory  wheezing; very little use of accessory muscles with minimal exertion and appreciated tachypnea.  4 L nasal cannula supplementation in place. Cardiovascular: Regular rate, no rubs, no gallops, positive systolic murmur, and mild JVD on exam. Abdomen: no tenderness, no masses palpated. No hepatosplenomegaly. Bowel sounds positive.  Musculoskeletal: no clubbing / cyanosis. No joint deformity upper and lower extremities. Good ROM, no contractures.  Trace to 1+ edema bilaterally (left more than right). Skin: no rashes, no petechiae. Neurologic: CN 2-12 grossly intact. Sensation intact, DTR normal. Strength 4/5 in all 4 limbs in the setting of poor effort.  Psychiatric: Normal judgment and insight. Alert and oriented x 3. Normal mood.    Labs on Admission: I have personally reviewed the following labs and imaging studies  CBC: Recent Labs  Lab 02/05/20 0727  WBC 7.6  NEUTROABS 5.9  HGB 10.0*  HCT 32.8*  MCV 98.5  PLT 225   Basic Metabolic Panel: Recent Labs  Lab 02/05/20 0727  NA 143  K 3.1*  CL 101  CO2 31  GLUCOSE 102*  BUN 14  CREATININE 0.88  CALCIUM 9.0   GFR: Estimated Creatinine Clearance: 32.9 mL/min (by C-G formula based on SCr of 0.88 mg/dL).   Coagulation Profile: Recent Labs  Lab 02/05/20 0727  INR 1.9*   Urine analysis:    Component Value Date/Time   COLORURINE YELLOW 10/25/2019 2052   APPEARANCEUR CLOUDY (A) 10/25/2019 2052   LABSPEC 1.009 10/25/2019 2052   PHURINE 5.0 10/25/2019 2052   GLUCOSEU NEGATIVE 10/25/2019 2052   GLUCOSEU NEGATIVE 04/10/2008 1246   HGBUR NEGATIVE 10/25/2019 2052   BILIRUBINUR NEGATIVE 10/25/2019 2052   KETONESUR NEGATIVE 10/25/2019 2052   PROTEINUR 30 (A) 10/25/2019 2052   UROBILINOGEN 0.2 10/20/2019 1404   NITRITE NEGATIVE 10/25/2019 2052   LEUKOCYTESUR NEGATIVE 10/25/2019 2052    Radiological Exams on Admission: DG Chest Port 1 View  Result Date: 02/05/2020 CLINICAL DATA:  Shortness of breath. EXAM: PORTABLE CHEST 1  VIEW COMPARISON:  01/01/2020. FINDINGS: Prior CABG and cardiac valve replacement. Stable cardiomegaly. Diffuse bilateral pulmonary interstitial prominence again noted. Small bilateral pleural effusions noted. No pneumothorax. IMPRESSION: Prior CABG and cardiac valve replacement.  Stable cardiomegaly. 2. Diffuse bilateral from interstitial prominence again noted suggesting interstitial edema. Small bilateral pleural effusions. Electronically Signed   By: Maisie Fus  Register   On: 02/05/2020 07:09    EKG: Independently reviewed.  No acute ischemic changes appreciated.  Normal axis.  Assessment/Plan 1-shortness of breath and orthopnea in the setting of acute on chronic combined  systolic/diastolic heart failure and COPD exacerbation. -Elevated BNP, vascular congestion and expiratory wheezing along with tachypnea and labored breathing appreciated on exam -Follow daily weights, strict I's and O's and low-sodium diet -IV Lasix has been started -Continue IV steroids, chronic oxygen supplementation and bronchodilators management.  Given prior history of Covid and following hospital protocol will wait on results of her Covid test before nebulizer management can be used. -Patient is DNR/DNI -Per recommendation by home health services ([facility]) who is actively following patient at home will ask palliative care service to visit her for further discussion about goals of care and advance directive.  2-elevated troponin/demand ischemia in the setting of CHF exacerbation -Patient with a prior history of coronary artery disease a status post CABG -Patient denying chest pain currently -EKG without acute ischemic changes -Continue beta-blocker, continue statins, continue Coumadin. -As mentioned above we will treat with diuretics her CHF exacerbation and will closely monitor on telemetry while doing that.  3-history of mechanical valvular replacement -Chronically on Coumadin -Pharmacy has been consulted to assist with  Coumadin dosage -INR Goal is for 2.5-3.5 -currently 1.9 (subtherapeutic).  4-hyperlipidemia -Continue statins.  5-chronic respiratory failure in the setting of COPD -Patient reports using 3-4 L nasal cannula supplementation chronically at home -Continue current oxygen supplementation -As mentioned above will provide treatment for COPD exacerbation.  6-dementia -Overall stable mentation -Continue daily orientation and continue Aricept. -No behavioral disturbances appreciated.  7-DNR/DNI -Patient expressed very clear no chest compression, no artificial feeding or mechanical intubation to sustain her life.  Patient's wishes will be respected -Following recommendation by case manager [facility] CM at home palliative care service will be contacted to further discuss advance directives.  8-anxiety -Continue as needed Xanax.  9-hypokalemia -Check magnesium level and provide electrolytes repletion -Close monitoring while actively diuresing patient.   DVT prophylaxis: Chronically on Coumadin. Code Status: DNR/DNI Family Communication: No family member at bedside. Disposition Plan: To be determined; hopefully discharge back home once breathing and volume status is stabilized. Consults called: Palliative care Admission status: Inpatient, length of stay more than 2 midnights; telemetry bed.   Time Spent: 70 minutes.  Vassie Loll MD [facility] Pager [phone]   02/05/2020, 9:17 AM

## 2020-02-05 NOTE — Telephone Encounter (Signed)
Pt daughter called to let us know that the patient is in the hospital and that when she is discharged she will make a HF.

## 2020-02-05 NOTE — ED Provider Notes (Signed)
Signout from Dr. Blinda Leatherwood.  86 year old female with history of COPD and CHF on Coumadin for valve here with increased shortness of breath over period of a few weeks.  She is pending lab work chest x-ray.  Getting steroids and breathing treatments.  Likely will need admission. Physical Exam  BP (!) 167/69   Pulse 79   Resp (!) 23   Wt 52.5 kg   SpO2 100%   BMI 21.17 kg/m   Physical Exam  ED Course/Procedures     Procedures  MDM   Chest x-ray read as pulmonary edema.  BNP and troponin elevated.  She received some Lasix and steroids and breathing treatment with some improvement in her symptoms.  Still looks moderately dyspneic.  Potassium low and orally repleted.  She is agreeable for admission for further management of this.  Hospitalist paged      Terrilee Files, MD 02/05/20 [phone]

## 2020-02-05 NOTE — Telephone Encounter (Signed)
FYI

## 2020-02-05 NOTE — ED Triage Notes (Signed)
Pt from home via [facility]. Pt C/O SOB that "has been getting worse recently."

## 2020-02-05 NOTE — Progress Notes (Signed)
ANTICOAGULATION CONSULT NOTE - Initial Consult  Pharmacy Consult for warfarin Indication: Mechanical aortic valve replacement  Allergies  Allergen Reactions  . Codeine Nausea And Vomiting  . Penicillins Other (See Comments)    Unknown reaction - listed on MAR Has patient had a PCN reaction causing immediate rash, facial/tongue/throat swelling, SOB or lightheadedness with hypotension: Unknown Has patient had a PCN reaction causing severe rash involving mucus membranes or skin necrosis: Unknown Has patient had a PCN reaction that required hospitalization: Unknown Has patient had a PCN reaction occurring within the last 10 years: Unknown If all of the above answers are "NO", then may proceed with Cephalosporin use.    Patient Measurements: Weight: 115 lb 11.9 oz (52.5 kg)   Vital Signs: Temp: 98 F (36.7 C) (03/23 1012) BP: 170/61 (03/23 1012) Pulse Rate: 85 (03/23 1012)  Labs: Recent Labs    02/05/20 0727 02/05/20 0924  HGB 10.0*  --   HCT 32.8*  --   PLT 225  --   LABPROT 22.1*  --   INR 1.9*  --   CREATININE 0.88  --   TROPONINIHS 49* 59*    Estimated Creatinine Clearance: 32.9 mL/min (by C-G formula based on SCr of 0.88 mg/dL).   Medical History: Past Medical History:  Diagnosis Date  . Anemia   . Anxiety   . Arthritis   . Blood transfusion without reported diagnosis   . Carotid artery stenosis    Without infarction  . Cataract   . Chronic combined systolic (congestive) and diastolic (congestive) heart failure ([place])   . COPD (chronic obstructive pulmonary disease) ([place])   . Coronary artery disease   . Heart valve replaced by other means   . Hypercholesterolemia    Pure  . Hypertension    Unspecified  . LBBB (left bundle branch block)   . Macular degeneration (senile) of retina, unspecified   . On home O2   . Postsurgical aortocoronary bypass status   . Stroke ([place])   . Transient global amnesia   . Unspecified hereditary and idiopathic peripheral  neuropathy   . Unspecified vitamin D deficiency     Medications:  Medications Prior to Admission  Medication Sig Dispense Refill Last Dose  . acetaminophen (TYLENOL) 325 MG tablet Take 650 mg by mouth every 4 (four) hours as needed.     . ALPRAZolam (XANAX) 0.25 MG tablet Take 0.25 mg by mouth at bedtime as needed for anxiety.     . Ascorbic Acid (VITAMIN C) 500 MG CAPS Take 500 mg by mouth daily.     Marland Kitchen atorvastatin (LIPITOR) 40 MG tablet TAKE ONE TABLET BY MOUTH ONCE DAILY. 90 tablet 1   . baclofen (LIORESAL) 10 MG tablet Take 10 mg by mouth as needed for muscle spasms.     . carvedilol (COREG) 6.25 MG tablet Take 6.25 mg by mouth 2 (two) times daily with a meal.     . docusate sodium (COLACE) 100 MG capsule Take 100 mg by mouth at bedtime.      . donepezil (ARICEPT) 10 MG tablet TAKE ONE TABLET BY MOUTH ONCE DAILY. 30 tablet 0   . ergocalciferol (VITAMIN D2) 50000 UNITS capsule Take 50,000 Units by mouth every Tuesday.      . ferrous sulfate 325 (65 FE) MG EC tablet Take 325 mg by mouth 2 (two) times daily.     . furosemide (LASIX) 20 MG tablet TAKE ONE TABLET BY MOUTH ONCE DAILY.TAKE EXTRA IF NEEDED. 180 tablet 0   .  hydrocortisone (ANUSOL-HC) 2.5 % rectal cream Place 1 application rectally 3 (three) times daily as needed for hemorrhoids or anal itching.     . levalbuterol (XOPENEX) 0.63 MG/3ML nebulizer solution USE 1 VIAL VIA NEBULIZER EVERY 8 HOURS AS NEEDED. 120 mL 1   . meclizine (ANTIVERT) 25 MG tablet Take 25 mg by mouth 3 (three) times daily as needed for dizziness.     . Melatonin 3 MG TABS Take 1 tablet by mouth at bedtime.     . multivitamin-lutein (OCUVITE-LUTEIN) CAPS capsule Take 1 capsule by mouth daily.     . pregabalin (LYRICA) 75 MG capsule Take 1 capsule (75 mg total) by mouth every other day. 15 capsule 5   . spironolactone (ALDACTONE) 25 MG tablet TAKE (1/2) TABLET BY MOUTH DAILY. 45 tablet 6   . traZODone (DESYREL) 50 MG tablet Take 0.5-1 tablets (25-50 mg total) by  mouth at bedtime as needed for sleep. 30 tablet 3   . umeclidinium-vilanterol (ANORO ELLIPTA) 62.5-25 MCG/INH AEPB Inhale 1 puff into the lungs daily.      Marland Kitchen warfarin (COUMADIN) 2.5 MG tablet Take 1 tablet (2.5 mg total) by mouth every evening. DO NOT TAKE COUMADIN UNTIL YOUR INR IS DRAWN ON 12/17-AND YOU RECEIVE further instructions from Coumadin clinic.     Marland Kitchen warfarin (COUMADIN) 5 MG tablet TAKE 1/2 TO 1 TABLET ONCE DAILY AS DIRECTED BY COUMADIN CLINIC. 45 tablet 0   . zinc sulfate 220 (50 Zn) MG capsule Take 220 mg by mouth daily.       Assessment: Pharmacy consulted to dose warfarin in patient with mechanical aortic valve replacement.  Patient's home dose listed as 5 mg Tue-Sat and 2.5 mg ROW.  INR on admission of 1.9.  Goal of Therapy:  INR goal 2.2-3.0 per Anticoag clinic Monitor platelets by anticoagulation protocol: Yes   Plan:  Warfarin 5 mg x 1 dose. Monitor daily INR and s/s of bleeding.  Judeth Cornfield, PharmD Clinical Pharmacist 02/05/2020 10:57 AM

## 2020-02-06 ENCOUNTER — Other Ambulatory Visit: Payer: Self-pay | Admitting: *Deleted

## 2020-02-06 DIAGNOSIS — Z515 Encounter for palliative care: Secondary | ICD-10-CM

## 2020-02-06 DIAGNOSIS — J9612 Chronic respiratory failure with hypercapnia: Secondary | ICD-10-CM

## 2020-02-06 DIAGNOSIS — Z7189 Other specified counseling: Secondary | ICD-10-CM

## 2020-02-06 DIAGNOSIS — I5043 Acute on chronic combined systolic (congestive) and diastolic (congestive) heart failure: Secondary | ICD-10-CM

## 2020-02-06 DIAGNOSIS — I447 Left bundle-branch block, unspecified: Secondary | ICD-10-CM

## 2020-02-06 DIAGNOSIS — N1832 Chronic kidney disease, stage 3b: Secondary | ICD-10-CM

## 2020-02-06 LAB — BASIC METABOLIC PANEL
Anion gap: 10 (ref 5–15)
BUN: 18 mg/dL (ref 8–23)
CO2: 33 mmol/L — ABNORMAL HIGH (ref 22–32)
Calcium: 8.8 mg/dL — ABNORMAL LOW (ref 8.9–10.3)
Chloride: 102 mmol/L (ref 98–111)
Creatinine, Ser: 0.99 mg/dL (ref 0.44–1.00)
GFR calc Af Amer: 58 mL/min — ABNORMAL LOW (ref >60)
GFR calc non Af Amer: 50 mL/min — ABNORMAL LOW (ref >60)
Glucose, Bld: 140 mg/dL — ABNORMAL HIGH (ref 70–99)
Potassium: 3.7 mmol/L (ref 3.5–5.1)
Sodium: 145 mmol/L (ref 135–145)

## 2020-02-06 LAB — PROTIME-INR
INR: 2.8 — ABNORMAL HIGH (ref 0.8–1.2)
Prothrombin Time: 29.8 seconds — ABNORMAL HIGH (ref 11.4–15.2)

## 2020-02-06 MED ORDER — MAGNESIUM SULFATE 2 GM/50ML IV SOLN
2.0000 g | Freq: Once | INTRAVENOUS | Status: AC
  Administered 2020-02-06: 11:00:00 2 g via INTRAVENOUS
  Filled 2020-02-06: qty 50

## 2020-02-06 NOTE — Progress Notes (Signed)
ANTICOAGULATION CONSULT NOTE -   Pharmacy Consult for warfarin Indication: Mechanical aortic valve replacement  Allergies  Allergen Reactions  . Codeine Nausea And Vomiting  . Penicillins Other (See Comments)    Unknown reaction - listed on MAR Has patient had a PCN reaction causing immediate rash, facial/tongue/throat swelling, SOB or lightheadedness with hypotension: Unknown Has patient had a PCN reaction causing severe rash involving mucus membranes or skin necrosis: Unknown Has patient had a PCN reaction that required hospitalization: Unknown Has patient had a PCN reaction occurring within the last 10 years: Unknown If all of the above answers are "NO", then may proceed with Cephalosporin use.    Patient Measurements: Weight: 116 lb 6.5 oz (52.8 kg)   Vital Signs: Temp: 98.2 F (36.8 C) (03/24 0510) Temp Source: Oral (03/24 0510) BP: 150/81 (03/24 0510) Pulse Rate: 57 (03/24 0510)  Labs: Recent Labs    02/05/20 0727 02/05/20 0924 02/06/20 0447  HGB 10.0*  --   --   HCT 32.8*  --   --   PLT 225  --   --   LABPROT 22.1*  --  29.8*  INR 1.9*  --  2.8*  CREATININE 0.88  --  0.99  TROPONINIHS 49* 59*  --     Estimated Creatinine Clearance: 29.3 mL/min (by C-G formula based on SCr of 0.99 mg/dL).   Medical History: Past Medical History:  Diagnosis Date  . Anemia   . Anxiety   . Arthritis   . Blood transfusion without reported diagnosis   . Carotid artery stenosis    Without infarction  . Cataract   . Chronic combined systolic (congestive) and diastolic (congestive) heart failure ([place])   . COPD (chronic obstructive pulmonary disease) ([place])   . Coronary artery disease   . Heart valve replaced by other means   . Hypercholesterolemia    Pure  . Hypertension    Unspecified  . LBBB (left bundle branch block)   . Macular degeneration (senile) of retina, unspecified   . On home O2   . Postsurgical aortocoronary bypass status   . Stroke ([place])   . Transient  global amnesia   . Unspecified hereditary and idiopathic peripheral neuropathy   . Unspecified vitamin D deficiency     Medications:  Medications Prior to Admission  Medication Sig Dispense Refill Last Dose  . acetaminophen (TYLENOL) 650 MG CR tablet Take 650 mg by mouth every 8 (eight) hours as needed for pain.     Marland Kitchen ALPRAZolam (XANAX) 0.25 MG tablet Take 0.125-0.25 mg by mouth at bedtime as needed for anxiety.    02/05/2020 at 0400  . Ascorbic Acid (VITAMIN C) 500 MG CAPS Take 500 mg by mouth daily.   02/04/2020 at 0800  . atorvastatin (LIPITOR) 40 MG tablet TAKE ONE TABLET BY MOUTH ONCE DAILY. (Patient taking differently: Take 40 mg by mouth daily. ) 90 tablet 1 02/04/2020 at 1730  . baclofen (LIORESAL) 10 MG tablet Take 10 mg by mouth as needed for muscle spasms.     . carvedilol (COREG) 6.25 MG tablet Take 6.25 mg by mouth 2 (two) times daily with a meal.   02/04/2020 at 1730  . docusate sodium (COLACE) 100 MG capsule Take 100 mg by mouth at bedtime.    02/04/2020 at 1730  . donepezil (ARICEPT) 10 MG tablet TAKE ONE TABLET BY MOUTH ONCE DAILY. (Patient taking differently: Take 10 mg by mouth daily. ) 30 tablet 0 02/04/2020 at 1730  . ergocalciferol (VITAMIN D2)  50000 UNITS capsule Take 50,000 Units by mouth every Tuesday.    01/29/2020 at 0800  . ferrous sulfate 325 (65 FE) MG EC tablet Take 325 mg by mouth daily.    02/04/2020 at 0800  . furosemide (LASIX) 20 MG tablet TAKE ONE TABLET BY MOUTH ONCE DAILY.TAKE EXTRA IF NEEDED. (Patient taking differently: Take 20 mg by mouth 2 (two) times daily as needed for fluid. Patient takes 1 tablet by mouth every day, but may take an additional tablet if needed for fluid build-up) 180 tablet 0 02/04/2020 at 0800  . HYDROcodone-acetaminophen (NORCO) 7.5-325 MG tablet Take 1 tablet by mouth every 6 (six) hours as needed for moderate pain.     . hydrocortisone (ANUSOL-HC) 2.5 % rectal cream Place 1 application rectally 3 (three) times daily as needed for  hemorrhoids or anal itching.     . meclizine (ANTIVERT) 25 MG tablet Take 25 mg by mouth 3 (three) times daily as needed for dizziness.     . Melatonin 3 MG TABS Take 1 tablet by mouth at bedtime.   02/04/2020 at 2130  . methylPREDNISolone (MEDROL) 4 MG tablet Take 4 mg by mouth 2 (two) times daily as needed.     . multivitamin-lutein (OCUVITE-LUTEIN) CAPS capsule Take 1 capsule by mouth daily.   02/04/2020 at 0800  . ondansetron (ZOFRAN) 4 MG tablet Take 4 mg by mouth every 8 (eight) hours as needed for nausea or vomiting.     . polyethylene glycol (MIRALAX / GLYCOLAX) 17 g packet Take 17 g by mouth daily as needed for moderate constipation.     . pregabalin (LYRICA) 75 MG capsule Take 1 capsule (75 mg total) by mouth every other day. 15 capsule 5 02/03/2020  . spironolactone (ALDACTONE) 25 MG tablet TAKE (1/2) TABLET BY MOUTH DAILY. (Patient taking differently: Take 12.5 mg by mouth daily. ) 45 tablet 6 02/04/2020 at 0800  . traMADol (ULTRAM) 50 MG tablet Take 50 mg by mouth every 6 (six) hours as needed for moderate pain.     . traZODone (DESYREL) 50 MG tablet Take 0.5-1 tablets (25-50 mg total) by mouth at bedtime as needed for sleep. 30 tablet 3   . umeclidinium-vilanterol (ANORO ELLIPTA) 62.5-25 MCG/INH AEPB Inhale 1 puff into the lungs daily.    02/05/2020 at 0400  . warfarin (COUMADIN) 5 MG tablet TAKE 1/2 TO 1 TABLET ONCE DAILY AS DIRECTED BY COUMADIN CLINIC. (Patient taking differently: Take 2.5-5 mg by mouth daily. Patient takes 5mg  on Tuesdays and Saturdays; on all other days, patient takes 2.5mg ) 45 tablet 0 02/04/2020 at 1730  . zinc sulfate 50 MG CAPS capsule Take 50 mg by mouth 2 (two) times daily.    02/04/2020 at 1730    Assessment: Pharmacy consulted to dose warfarin in patient with mechanical aortic valve replacement.  Patient's home dose listed as 5 mg Tue-Sat and 2.5 mg ROW.  INR on admission of 1.9 >> 2.8 today  Goal of Therapy:  INR goal 2.2-3.0 per Anticoag clinic Monitor  platelets by anticoagulation protocol: Yes   Plan:  Hold warfarin x 1 dose due to large increase in INR Monitor daily INR and s/s of bleeding.  02/06/2020, PharmD Clinical Pharmacist 02/06/2020 7:56 AM

## 2020-02-06 NOTE — Evaluation (Signed)
Physical Therapy Evaluation Patient Details Name: [name] MRN: [id number] DOB: 23-Jun-1926 Today's Date: 02/06/2020   History of Present Illness  [name] is a 86 y.o. female with past medical history significant for chronic combined systolic and diastolic heart failure, COPD, hypertension, mechanical valve replacement (chronically on Coumadin), hypercholesterolemia, anxiety, chronic respiratory failure using 4 L nasal cannula at baseline and vitamin D deficiency; who presented to the hospital secondary to worsening shortness of breath.  Patient reports that for the last 3-4 days she had noticed increase in her shortness of breath initially with exertion and then also at rest; patient expressed some orthopnea and increased swelling in her lower extremity as well.  Very little variation on her weight and expressed good medication compliance.  Patient denies fever, chills, chest pain, nausea, vomiting, abdominal pain, dysuria, hematuria, melena, hematochezia, headaches, blurred vision or focal weakness. Of note, patient has a prior history of positive Covid infection in December 2020.  She expressed full recovery from that.    Clinical Impression  Patient limited for functional mobility as stated below secondary to BLE weakness, fatigue and poor standing balance. Patient does not require physical assist for bed mobility and for transfer to standing. She requires guard assist for balance and safety upon standing due to impaired balance and unsteadiness. She reaches for nearby object for balance with standing and ambulation. Balance and gait improve with use of RW. Patient and daughter educated on use of RW upon returning home secondary to impaired balance and LE strength.  Patient will benefit from continued physical therapy in hospital and recommended venue below to increase strength, balance, endurance for safe ADLs and gait.     Follow Up Recommendations Home health PT;Supervision for  mobility/OOB;Supervision/Assistance - 24 hour    Equipment Recommendations  None recommended by PT    Recommendations for Other Services       Precautions / Restrictions Precautions Precautions: Fall Restrictions Weight Bearing Restrictions: No      Mobility  Bed Mobility Overal bed mobility: Needs Assistance Bed Mobility: Supine to Sit;Sit to Supine     Supine to sit: Supervision;HOB elevated Sit to supine: Supervision;HOB elevated   General bed mobility comments: slightly slow, labored  Transfers Overall transfer level: Needs assistance Equipment used: None Transfers: Sit to/from [employer] Sit to Stand: Min guard Stand pivot transfers: Min guard       General transfer comment: guard for safety and balance, patient reaches for near by objects upon standing due to impaired balance  Ambulation/Gait Ambulation/Gait assistance: Min guard Gait Distance (Feet): 100 Feet Assistive device: Rolling walker (2 wheeled);None Gait Pattern/deviations: Decreased step length - left;Decreased step length - right;Step-through pattern;Decreased stride length;Trunk flexed Gait velocity: decreased   General Gait Details: Patient ambulates several unsteady steps without AD reaching for nearby objects, gait improves with use of RW  Stairs            Wheelchair Mobility    Modified Rankin (Stroke Patients Only)       Balance Overall balance assessment: Needs assistance Sitting-balance support: Feet supported;No upper extremity supported Sitting balance-Leahy Scale: Good Sitting balance - Comments: seated EOB   Standing balance support: No upper extremity supported;During functional activity Standing balance-Leahy Scale: Poor Standing balance comment: without AD, improves to fair/good with RW                             Pertinent Vitals/Pain Pain Assessment: No/denies pain  Home Living Family/patient expects to be discharged to::  Private residence Living Arrangements: Children;Non-relatives/Friends Available Help at Discharge: Family;Personal care attendant;Available 24 hours/day Type of Home: House Home Access: Stairs to enter;Ramped entrance Entrance Stairs-Rails: Right Entrance Stairs-Number of Steps: 3 Home Layout: One level Home Equipment: Shower seat - built in;Walker - 2 wheels;Walker - 4 wheels;Cane - single point;Bedside commode;Wheelchair - manual      Prior Function Level of [place]: Needs assistance   Gait / Transfers Assistance Needed: patient is a household ambulator without use of AD, daughter reports patient furniture walks  ADL's / Homemaking Assistance Needed: caregiver/family assists        Hand Dominance   Dominant Hand: Right    Extremity/Trunk Assessment   Upper Extremity Assessment Upper Extremity Assessment: Overall WFL for tasks assessed    Lower Extremity Assessment Lower Extremity Assessment: Generalized weakness    Cervical / Trunk Assessment Cervical / Trunk Assessment: Kyphotic  Communication   Communication: No difficulties;HOH  Cognition Arousal/Alertness: Awake/alert Behavior During Therapy: WFL for tasks assessed/performed Overall Cognitive Status: Within Functional Limits for tasks assessed                                        General Comments      Exercises     Assessment/Plan    PT Assessment Patient needs continued PT services  PT Problem List Decreased strength;Decreased activity tolerance;Decreased balance;Decreased mobility;Decreased knowledge of use of DME;Decreased safety awareness       PT Treatment Interventions DME instruction;Balance training;Gait training;Neuromuscular re-education;Stair training;Functional mobility training;Patient/family education;Therapeutic activities;Therapeutic exercise    PT Goals (Current goals can be found in the Care Plan section)  Acute Rehab PT Goals Patient Stated Goal: return  home PT Goal Formulation: With patient Time For Goal Achievement: 02/13/20 Potential to Achieve Goals: Good    Frequency Min 3X/week   Barriers to discharge        Co-evaluation               AM-PAC PT "6 Clicks" Mobility  Outcome Measure Help needed turning from your back to your side while in a flat bed without using bedrails?: None Help needed moving from lying on your back to sitting on the side of a flat bed without using bedrails?: None Help needed moving to and from a bed to a chair (including a wheelchair)?: A Little Help needed standing up from a chair using your arms (e.g., wheelchair or bedside chair)?: A Little Help needed to walk in hospital room?: A Little Help needed climbing 3-5 steps with a railing? : A Little 6 Click Score: 20    End of Session Equipment Utilized During Treatment: Gait belt;Oxygen Activity Tolerance: Patient tolerated treatment well;Patient limited by fatigue Patient left: in bed;with call bell/phone within reach;with nursing/sitter in room Nurse Communication: Mobility status PT Visit Diagnosis: Unsteadiness on feet (R26.81);Other abnormalities of gait and mobility (R26.89);Muscle weakness (generalized) (M62.81)    Time: 4627-0350 PT Time Calculation (min) (ACUTE ONLY): 29 min   Charges:   PT Evaluation $PT Eval Low Complexity: 1 Low PT Treatments $Therapeutic Activity: 8-22 mins       1:42 PM, 02/06/20 Mearl Latin PT, DPT Physical Therapist at [facility]

## 2020-02-06 NOTE — Progress Notes (Signed)
PROGRESS NOTE  [name] HKV:[id number] DOB: May 19, 1926 DOA: 02/05/2020 PCP: Maryruth Hancock, MD  Brief History:  86 year old female with a history of systolic and diastolic CHF, chronic respiratory failure on 4 L oxygen, hyperlipidemia, coronary artery disease, LBBB, coronary disease, CKD stage III, AVR presented with 1 to 2 days of worsening shortness of breath.  The patient states that she has had intermittent shortness of breath for at least 2 weeks with worsening over the last 1 to 2 days prior to admission.  She denies any fevers, chills, chest pain, nausea, vomiting, diarrhea, abdominal pain.  Upon presentation, the patient was noted to be fluid overloaded with chest x-ray showing diffuse bilateral interstitial prominence and BNP 1712.  She was started on IV furosemide with good clinical effect.  Assessment/Plan: Acute on chronic systolic and diastolic CHF -03/21/4331 echo EF 25-30%, diffuse HK, moderate MR, moderate AI -Continue IV furosemide -Remains fluid overloaded with JVD -Daily weights -Accurate I's and O's  Chronic respiratory failure with hypoxia and hypercarbia -Patient is currently on 4 L nasal cannula -Currently stable on 3 L  CKD stage IIIb -Baseline creatinine 0.9-1.2 -Monitor with diuresis  COPD -Stable without wheezing -Discontinue IV steroids -Continue Anoro  Coronary artery disease status post CABG -No chest pain -Continue carvedilol -continue statin  Status post AVR -Continue warfarin with pharmacy assistance  Hypokalemia -Repleted -Magnesium 1.9  Hypertension -Blood pressure acceptable -Continue carvedilol  Cognitive impairment -Continue Aricept  Hyperlipidemia -Continue statin  LBBB -chronic        Disposition Plan: Patient From: Hom D/C Place: Home - 1-2  Days Barriers: Not Clinically Stable--remains fluid overloaded on IV lasix  Family Communication:   Caregiver updated at bedside 3/24  Consultants:   none  Code Status:  DNR  DVT Prophylaxis: coumadin   Procedures: As Listed in Progress Note Above  Antibiotics: None    Subjective: Patient denies fevers, chills, headache, chest pain, dyspnea, nausea, vomiting, diarrhea, abdominal pain, dysuria, hematuria, hematochezia, and melena.   Objective: Vitals:   02/05/20 2001 02/05/20 2036 02/06/20 0500 02/06/20 0510  BP:  (!) 142/51  (!) 150/81  Pulse:  60  (!) 57  Resp:  16  16  Temp:  97.6 F (36.4 C)  98.2 F (36.8 C)  TempSrc:  Oral  Oral  SpO2: 95% 96%  98%  Weight:   52.8 kg     Intake/Output Summary (Last 24 hours) at 02/06/2020 0744 Last data filed at 02/05/2020 2110 Gross per 24 hour  Intake 3 ml  Output 700 ml  Net -697 ml   Weight change: 0.3 kg Exam:   General:  Pt is alert, follows commands appropriately, not in acute distress  HEENT: No icterus, No thrush, No neck mass, [place]/AT  Cardiovascular: RRR, S1/S2, no rubs, no gallops  Respiratory: bibasilar crackles. No wheeze  Abdomen: Soft/+BS, non tender, non distended, no guarding  Extremities: No edema, No lymphangitis, No petechiae, No rashes, no synovitis   Data Reviewed: I have personally reviewed following labs and imaging studies Basic Metabolic Panel: Recent Labs  Lab 02/05/20 0727 02/05/20 0924 02/06/20 0447  NA 143  --  145  K 3.1*  --  3.7  CL 101  --  102  CO2 31  --  33*  GLUCOSE 102*  --  140*  BUN 14  --  18  CREATININE 0.88  --  0.99  CALCIUM 9.0  --  8.8*  MG  --  1.9  --    Liver Function Tests: No results for input(s): AST, ALT, ALKPHOS, BILITOT, PROT, ALBUMIN in the last 168 hours. No results for input(s): LIPASE, AMYLASE in the last 168 hours. No results for input(s): AMMONIA in the last 168 hours. Coagulation Profile: Recent Labs  Lab 02/05/20 0727 02/06/20 0447  INR 1.9* 2.8*   CBC: Recent Labs  Lab 02/05/20 0727  WBC 7.6  NEUTROABS 5.9  HGB 10.0*  HCT 32.8*  MCV 98.5  PLT 225   Cardiac Enzymes: No  results for input(s): CKTOTAL, CKMB, CKMBINDEX, TROPONINI in the last 168 hours. BNP: Invalid input(s): POCBNP CBG: No results for input(s): GLUCAP in the last 168 hours. HbA1C: No results for input(s): HGBA1C in the last 72 hours. Urine analysis:    Component Value Date/Time   COLORURINE YELLOW 10/25/2019 2052   APPEARANCEUR CLOUDY (A) 10/25/2019 2052   LABSPEC 1.009 10/25/2019 2052   PHURINE 5.0 10/25/2019 2052   GLUCOSEU NEGATIVE 10/25/2019 2052   GLUCOSEU NEGATIVE 04/10/2008 1246   HGBUR NEGATIVE 10/25/2019 2052   BILIRUBINUR NEGATIVE 10/25/2019 2052   KETONESUR NEGATIVE 10/25/2019 2052   PROTEINUR 30 (A) 10/25/2019 2052   UROBILINOGEN 0.2 10/20/2019 1404   NITRITE NEGATIVE 10/25/2019 2052   LEUKOCYTESUR NEGATIVE 10/25/2019 2052   Sepsis Labs: @LABRCNTIP (procalcitonin:4,lacticidven:4) ) Recent Results (from the past 240 hour(s))  SARS CORONAVIRUS 2 (Everlene Cunning 6-24 HRS) Nasopharyngeal Nasopharyngeal Swab     Status: None   Collection Time: 02/05/20  8:47 AM   Specimen: Nasopharyngeal Swab  Result Value Ref Range Status   SARS Coronavirus 2 NEGATIVE NEGATIVE Final    Comment: (NOTE) SARS-CoV-2 target nucleic acids are NOT DETECTED. The SARS-CoV-2 RNA is generally detectable in upper and lower respiratory specimens during the acute phase of infection. Negative results do not preclude SARS-CoV-2 infection, do not rule out co-infections with other pathogens, and should not be used as the sole basis for treatment or other patient management decisions. Negative results must be combined with clinical observations, patient history, and epidemiological information. The expected result is Negative. Fact Sheet for Patients: 02/07/20 Fact Sheet for Healthcare Providers: HairSlick.no This test is not yet approved or cleared by the quierodirigir.com FDA and  has been authorized for detection and/or diagnosis of SARS-CoV-2  by FDA under an Emergency Use Authorization (EUA). This EUA will remain  in effect (meaning this test can be used) for the duration of the COVID-19 declaration under Section 56 4(b)(1) of the Act, 21 U.S.C. section 360bbb-3(b)(1), unless the authorization is terminated or revoked sooner. Performed at [facility], [address], [place], [place]      Scheduled Meds: . atorvastatin  40 mg Oral Daily  . carvedilol  6.25 mg Oral BID WC  . donepezil  10 mg Oral Daily  . furosemide  20 mg Intravenous Q12H  . mouth rinse  15 mL Mouth Rinse BID  . methylPREDNISolone (SOLU-MEDROL) injection  40 mg Intravenous Q12H  . potassium chloride  20 mEq Oral Daily  . pregabalin  75 mg Oral QODAY  . sodium chloride flush  3 mL Intravenous Q12H  . umeclidinium-vilanterol  1 puff Inhalation Daily  . Warfarin - Pharmacist Dosing Inpatient   Does not apply q1800   Continuous Infusions: . sodium chloride      Procedures/Studies: DG Chest Port 1 View  Result Date: 02/05/2020 CLINICAL DATA:  Shortness of breath. EXAM: PORTABLE CHEST 1 VIEW COMPARISON:  01/01/2020. FINDINGS: Prior CABG and cardiac valve replacement. Stable  cardiomegaly. Diffuse bilateral pulmonary interstitial prominence again noted. Small bilateral pleural effusions noted. No pneumothorax. IMPRESSION: Prior CABG and cardiac valve replacement.  Stable cardiomegaly. 2. Diffuse bilateral from interstitial prominence again noted suggesting interstitial edema. Small bilateral pleural effusions. Electronically Signed   By: Maisie Fus  Register   On: 02/05/2020 07:09    Catarina Hartshorn, DO  [facility]  If 7PM-7AM, please contact night-coverage [email or web address] Password [facility] 02/06/2020, 7:44 AM   LOS: 1 day

## 2020-02-06 NOTE — Plan of Care (Signed)
  Problem: Acute Rehab PT Goals(only PT should resolve) Goal: Patient Will Transfer Sit To/From Stand Outcome: Progressing Flowsheets (Taken 02/06/2020 1344) Patient will transfer sit to/from stand: with supervision Goal: Pt Will Transfer Bed To Chair/Chair To Bed Outcome: Progressing Flowsheets (Taken 02/06/2020 1344) Pt will Transfer Bed to Chair/Chair to Bed: with supervision Goal: Pt Will Perform Standing Balance Or Pre-Gait Outcome: Progressing Flowsheets (Taken 02/06/2020 1344) Pt will perform standing balance or pre-gait: with Supervision Goal: Pt Will Ambulate Outcome: Progressing Flowsheets (Taken 02/06/2020 1344) Pt will Ambulate:  > 125 feet  with supervision  with least restrictive assistive device Goal: Pt/caregiver will Perform Home Exercise Program Outcome: Progressing Flowsheets (Taken 02/06/2020 1344) Pt/caregiver will Perform Home Exercise Program:  For increased strengthening  For improved balance  Independently  1:45 PM, 02/06/20 Wyman Songster PT, DPT Physical Therapist at [facility]

## 2020-02-06 NOTE — Consult Note (Signed)
Consultation Note Date: 02/06/2020   Patient Name: [name]  DOB: 05-19-1926  MRN: [id number]  Age / Sex: 86 y.o., female  PCP: Maryruth Hancock, MD Referring Physician: Orson Eva, MD  Reason for Consultation: Establishing goals of care  HPI/Patient Profile: 86 y.o. female  with past medical history of mild dementia, systolic and diastolic CHF (EF 40-98% 11/15/89), AVR on warfarin, COPD, chronic respiratory failure on 4L nasal cannula, hyperlipidemia, CAD, LBBB, CKD stage 3 admitted on 02/05/2020 with worsening shortness of breath x 1-2 days and general worsening over 1-2 weeks due to fluid overload with acute on chronic CHF exacerbation and has responded to IV Lasix for diuresis.   Clinical Assessment and Goals of Care: I have reviewed records and note some generalized decline with CHF and COPD combination.   I met today with [name] and her daughter, [name], at bedside and she appears very well and is very spry. She lives in her home and has a Armed forces technical officer that also lives there and available to her if she needs assistance. Her daughter is also very supportive. She is very interested in maintaining her independence and quality of life is more important to her than quantity of life. She would not be interested in any artificial life prolonging measures and tells me that she is DNR. She also tells me that she has discussed with her family her wishes and has made her funeral plans.   Main concerns are that she has less reserve these days and we want [name] to conserve her energy for things in life that bring her joy. They would like to see if they can arrange for INR checks and blood work to be done at home as it is difficult for her to get to the doctor (she would much rather save this energy for a meal out when feeling up to it). They are interested in pursuing home PT and had a good experience with  Simona Huh from [place]. Their other concern is that her PCP is leaving in June and they are anxious to get connected to another provider here in [place] if at all possible. [name] is concerned because she takes an occasional Xanax at night but not every night and her current PCP will not provide refills. She is frustrated as she feels that this is something that does help her when needed and when she is a little short of breath. She feels that this symptom management does add to her quality of life. I am not sure if this is something that can be provided by pulmonary or even outpatient palliative care potentially. [name] is open to outpatient palliative care visits for additional support. I will reach out to transitions of care team and [facility] for assistance in options for home resources.   All questions/concerns addressed. Emotional support provided.   Primary Decision Maker PATIENT    SUMMARY OF RECOMMENDATIONS   - Goals clear for continued treatment with optimized support at home to avoid hospitalization and maximize quality of  life.   Code Status/Advance Care Planning:  DNR   Symptom Management:   Per attending   Psycho-social/Spiritual:   Desire for further Chaplaincy support:no  Additional Recommendations: Caregiving  Support/Resources and Referral to Community Resources   Prognosis:   Unable to determine  Discharge Planning: Home with Home Health      Primary Diagnoses: Present on Admission: . Acute on chronic systolic HF (heart failure) ([place]) . LBBB (left bundle branch block) . Essential hypertension . Chronic respiratory failure with hypoxia and hypercapnia (HCC) . CKD (chronic kidney disease) stage 3, GFR 30-59 ml/min   I have reviewed the medical record, interviewed the patient and family, and examined the patient. The following aspects are pertinent.  Past Medical History:  Diagnosis Date  . Anemia   . Anxiety   . Arthritis   . Blood transfusion  without reported diagnosis   . Carotid artery stenosis    Without infarction  . Cataract   . Chronic combined systolic (congestive) and diastolic (congestive) heart failure ([place])   . COPD (chronic obstructive pulmonary disease) ([place])   . Coronary artery disease   . Heart valve replaced by other means   . Hypercholesterolemia    Pure  . Hypertension    Unspecified  . LBBB (left bundle branch block)   . Macular degeneration (senile) of retina, unspecified   . On home O2   . Postsurgical aortocoronary bypass status   . Stroke ([place])   . Transient global amnesia   . Unspecified hereditary and idiopathic peripheral neuropathy   . Unspecified vitamin D deficiency    Social History   Socioeconomic History  . Marital status: Widowed    Spouse name: Not on file  . Number of children: 2  . Years of education: 12th  . Highest education level: Not on file  Occupational History    Employer: RETIRED  Tobacco Use  . Smoking status: Former Smoker    Packs/day: 0.50    Types: Cigarettes    Quit date: 11/30/1991    Years since quitting: 28.2  . Smokeless tobacco: Never Used  . Tobacco comment: Tobacco use-no  Substance and Sexual Activity  . Alcohol use: No  . Drug use: No  . Sexual activity: Never  Other Topics Concern  . Not on file  Social History Narrative   Pt lives at home alone.   Caffeine Use: very rarely   Social Determinants of Health   Financial Resource Strain: Low Risk   . Difficulty of Paying Living Expenses: Not hard at all  Food Insecurity: No Food Insecurity  . Worried About Charity fundraiser in the Last Year: Never true  . Ran Out of Food in the Last Year: Never true  Transportation Needs: No Transportation Needs  . Lack of Transportation (Medical): No  . Lack of Transportation (Non-Medical): No  Physical Activity: Insufficiently Active  . Days of Exercise per Week: 3 days  . Minutes of Exercise per Session: 10 min  Stress: No Stress Concern Present  .  Feeling of Stress : Only a little  Social Connections: Slightly Isolated  . Frequency of Communication with Friends and Family: More than three times a week  . Frequency of Social Gatherings with Friends and Family: Three times a week  . Attends Religious Services: 1 to 4 times per year  . Active Member of Clubs or Organizations: Yes  . Attends Archivist Meetings: 1 to 4 times per year  . Marital Status: Widowed  Family History  Problem Relation Age of Onset  . Heart attack Father   . Heart disease Father   . Stroke Father   . Stroke Sister   . Hypertension Mother   . Stroke Mother   . Neuropathy Brother   . COPD Sister    Scheduled Meds: . atorvastatin  40 mg Oral Daily  . carvedilol  6.25 mg Oral BID WC  . donepezil  10 mg Oral Daily  . furosemide  20 mg Intravenous Q12H  . mouth rinse  15 mL Mouth Rinse BID  . potassium chloride  20 mEq Oral Daily  . pregabalin  75 mg Oral QODAY  . sodium chloride flush  3 mL Intravenous Q12H  . umeclidinium-vilanterol  1 puff Inhalation Daily  . Warfarin - Pharmacist Dosing Inpatient   Does not apply q1800   Continuous Infusions: . sodium chloride     PRN Meds:.sodium chloride, acetaminophen, albuterol, ALPRAZolam, meclizine, ondansetron (ZOFRAN) IV, sodium chloride flush, traZODone Allergies  Allergen Reactions  . Codeine Nausea And Vomiting  . Penicillins Other (See Comments)    Unknown reaction - listed on MAR Has patient had a PCN reaction causing immediate rash, facial/tongue/throat swelling, SOB or lightheadedness with hypotension: Unknown Has patient had a PCN reaction causing severe rash involving mucus membranes or skin necrosis: Unknown Has patient had a PCN reaction that required hospitalization: Unknown Has patient had a PCN reaction occurring within the last 10 years: Unknown If all of the above answers are "NO", then may proceed with Cephalosporin use.   Review of Systems  Constitutional: Positive for  activity change.       Less reserve  Respiratory: Positive for shortness of breath.        SOB improving  Psychiatric/Behavioral: The patient is nervous/anxious.        Occasional anxiety  All other systems reviewed and are negative.   Physical Exam Vitals and nursing note reviewed.  Constitutional:      General: She is not in acute distress.    Appearance: She is not ill-appearing.  Cardiovascular:     Rate and Rhythm: Bradycardia present.  Pulmonary:     Effort: Pulmonary effort is normal. No tachypnea, accessory muscle usage or respiratory distress.  Abdominal:     General: Abdomen is flat.  Neurological:     Mental Status: She is alert and oriented to person, place, and time.     Vital Signs: BP (!) 150/81 (BP Location: Right Arm)   Pulse (!) 57   Temp 98.2 F (36.8 C) (Oral)   Resp 16   Wt 52.8 kg   SpO2 96%   BMI 21.29 kg/m  Pain Scale: 0-10   Pain Score: 0-No pain   SpO2: SpO2: 96 % O2 Device:SpO2: 96 % O2 Flow Rate: .O2 Flow Rate (L/min): 3 L/min  IO: Intake/output summary:   Intake/Output Summary (Last 24 hours) at 02/06/2020 1337 Last data filed at 02/06/2020 1300 Gross per 24 hour  Intake 480 ml  Output 1200 ml  Net -720 ml    LBM: Last BM Date: 02/04/20 Baseline Weight: Weight: 52.5 kg Most recent weight: Weight: 52.8 kg     Palliative Assessment/Data:     Time In: 1430 Time Out: 1640 Time Total: 70 min Greater than 50%  of this time was spent counseling and coordinating care related to the above assessment and plan.  Signed by: Vinie Sill, NP Palliative Medicine Team Pager # [phone] (M-F 8a-5p) Team Phone # [phone] (Nights/Weekends)

## 2020-02-07 ENCOUNTER — Inpatient Hospital Stay (HOSPITAL_COMMUNITY): Payer: Medicare Other

## 2020-02-07 DIAGNOSIS — Z515 Encounter for palliative care: Secondary | ICD-10-CM

## 2020-02-07 DIAGNOSIS — J9621 Acute and chronic respiratory failure with hypoxia: Secondary | ICD-10-CM

## 2020-02-07 DIAGNOSIS — N183 Chronic kidney disease, stage 3 unspecified: Secondary | ICD-10-CM

## 2020-02-07 DIAGNOSIS — J9622 Acute and chronic respiratory failure with hypercapnia: Secondary | ICD-10-CM

## 2020-02-07 LAB — CBC
HCT: 35.3 % — ABNORMAL LOW (ref 36.0–46.0)
Hemoglobin: 10.7 g/dL — ABNORMAL LOW (ref 12.0–15.0)
MCH: 30.1 pg (ref 26.0–34.0)
MCHC: 30.3 g/dL (ref 30.0–36.0)
MCV: 99.4 fL (ref 80.0–100.0)
Platelets: 244 10*3/uL (ref 150–400)
RBC: 3.55 MIL/uL — ABNORMAL LOW (ref 3.87–5.11)
RDW: 13.2 % (ref 11.5–15.5)
WBC: 13.6 10*3/uL — ABNORMAL HIGH (ref 4.0–10.5)
nRBC: 0 % (ref 0.0–0.2)

## 2020-02-07 LAB — BRAIN NATRIURETIC PEPTIDE: B Natriuretic Peptide: 1331 pg/mL — ABNORMAL HIGH (ref 0.0–100.0)

## 2020-02-07 LAB — BASIC METABOLIC PANEL
Anion gap: 10 (ref 5–15)
BUN: 28 mg/dL — ABNORMAL HIGH (ref 8–23)
CO2: 33 mmol/L — ABNORMAL HIGH (ref 22–32)
Calcium: 8.7 mg/dL — ABNORMAL LOW (ref 8.9–10.3)
Chloride: 101 mmol/L (ref 98–111)
Creatinine, Ser: 1.09 mg/dL — ABNORMAL HIGH (ref 0.44–1.00)
GFR calc Af Amer: 51 mL/min — ABNORMAL LOW (ref >60)
GFR calc non Af Amer: 44 mL/min — ABNORMAL LOW (ref >60)
Glucose, Bld: 88 mg/dL (ref 70–99)
Potassium: 3.6 mmol/L (ref 3.5–5.1)
Sodium: 144 mmol/L (ref 135–145)

## 2020-02-07 LAB — PROTIME-INR
INR: 2.8 — ABNORMAL HIGH (ref 0.8–1.2)
Prothrombin Time: 29.4 seconds — ABNORMAL HIGH (ref 11.4–15.2)

## 2020-02-07 LAB — MAGNESIUM: Magnesium: 2.4 mg/dL (ref 1.7–2.4)

## 2020-02-07 LAB — PROCALCITONIN: Procalcitonin: <0.1 ng/mL

## 2020-02-07 MED ORDER — MORPHINE SULFATE (PF) 2 MG/ML IV SOLN: 2.0000 mg | INTRAVENOUS | Status: DC | PRN

## 2020-02-07 MED ORDER — ALPRAZOLAM 0.25 MG PO TABS
0.2500 mg | ORAL_TABLET | Freq: Three times a day (TID) | ORAL | Status: DC | PRN
  Administered 2020-02-07: 0.25 mg via ORAL
  Filled 2020-02-07: qty 1

## 2020-02-07 MED ORDER — FUROSEMIDE 40 MG PO TABS
40.0000 mg | ORAL_TABLET | Freq: Every day | ORAL | Status: DC
  Administered 2020-02-07: 40 mg via ORAL
  Filled 2020-02-07: qty 1

## 2020-02-07 MED ORDER — FUROSEMIDE 10 MG/ML IJ SOLN
40.0000 mg | Freq: Once | INTRAMUSCULAR | Status: AC
  Administered 2020-02-07: 40 mg via INTRAVENOUS
  Filled 2020-02-07: qty 4

## 2020-02-07 MED ORDER — IPRATROPIUM-ALBUTEROL 0.5-2.5 (3) MG/3ML IN SOLN
3.0000 mL | Freq: Three times a day (TID) | RESPIRATORY_TRACT | Status: DC
  Administered 2020-02-08 – 2020-02-09 (×4): 3 mL via RESPIRATORY_TRACT
  Filled 2020-02-07 (×3): qty 3

## 2020-02-07 MED ORDER — WARFARIN SODIUM 2.5 MG PO TABS
2.5000 mg | ORAL_TABLET | Freq: Once | ORAL | Status: AC
  Administered 2020-02-07: 18:00:00 2.5 mg via ORAL
  Filled 2020-02-07: qty 1

## 2020-02-07 MED ORDER — IPRATROPIUM-ALBUTEROL 0.5-2.5 (3) MG/3ML IN SOLN
3.0000 mL | Freq: Four times a day (QID) | RESPIRATORY_TRACT | Status: DC
  Administered 2020-02-07 (×2): 3 mL via RESPIRATORY_TRACT
  Filled 2020-02-07 (×2): qty 3

## 2020-02-07 MED ORDER — FUROSEMIDE 10 MG/ML IJ SOLN
20.0000 mg | Freq: Two times a day (BID) | INTRAMUSCULAR | Status: DC
  Filled 2020-02-07: qty 2

## 2020-02-07 MED ORDER — BUDESONIDE 0.5 MG/2ML IN SUSP
0.5000 mg | Freq: Two times a day (BID) | RESPIRATORY_TRACT | Status: DC
  Administered 2020-02-07 – 2020-02-09 (×5): 0.5 mg via RESPIRATORY_TRACT
  Filled 2020-02-07 (×5): qty 2

## 2020-02-07 NOTE — Progress Notes (Addendum)
PROGRESS NOTE  [name] BSJ:[id number] DOB: 07-30-1926 DOA: 02/05/2020 PCP: Wandra Feinstein, MD  Brief History:  86 year old female with a history of systolic and diastolic CHF, chronic respiratory failure on 4 L oxygen, hyperlipidemia, coronary artery disease, LBBB, coronary disease, CKD stage III, AVR presented with 1 to 2 days of worsening shortness of breath.  The patient states that she has had intermittent shortness of breath for at least 2 weeks with worsening over the last 1 to 2 days prior to admission.  She denies any fevers, chills, chest pain, nausea, vomiting, diarrhea, abdominal pain.  Upon presentation, the patient was noted to be fluid overloaded with chest x-ray showing diffuse bilateral interstitial prominence and BNP 1712.  She was started on IV furosemide with good clinical effect. During the am 3/25, she became more dyspneic.  Repeat CXR and labs were ordered and duonebs were started.  Assessment/Plan: Acute on chronic systolic and diastolic CHF -01/22/2019 echo EF 25-30%, diffuse HK, moderate MR, moderate AI -Continue IV furosemide -Remains fluid overloaded with JVD -Daily weights -Accurate I's and O's--NEG 1.4L -SARS-CoV2--neg -repeat BNP  Acute on Chronic respiratory failure with hypoxia and hypercarbia -Patient is currently on 4 L nasal cannula which is her home demand -more dysneic today -3/25--repeat CXR -start duonebs -start pulmicort -VS at 1030-HR 72-RR22--148/91--98% 4L  CKD stage IIIb -Baseline creatinine 0.9-1.2 -Monitor with diuresis  COPD -Stable without wheezing -Discontinued IV steroids 3/24 -start duonebs -start pulmicort  Coronary artery disease status post CABG -No chest pain -Continue carvedilol -continue statin  Status post AVR -Continue warfarin with pharmacy assistance  Hypokalemia -Repleted -Magnesium 1.9  Hypertension -Blood pressure acceptable -Continue carvedilol  Cognitive  impairment -Continue Aricept  Hyperlipidemia -Continue statin  LBBB -chronic        Disposition Plan: Patient From: Hom D/C Place: Home - 1-2  Days Barriers: Not Clinically Stable--remains fluid overloaded;  Respiratory distress 3/25  Family Communication:   Caregiver and granddaughter updated at bedside 3/25  Consultants:  none  Code Status:  DNR  DVT Prophylaxis: coumadin   Procedures: As Listed in Progress Note Above  Antibiotics: None   Subjective: Pt complains of sob since 3AM this morning.  Denies cp, n/v/d, abd pain  Objective: Vitals:   02/07/20 0500 02/07/20 0519 02/07/20 0804 02/07/20 0953  BP:  (!) 162/61  (!) 172/66  Pulse:  64  73  Resp:  17  (!) 24  Temp:  98 F (36.7 C)    TempSrc:  Oral    SpO2:  98% 98% 97%  Weight: 52.7 kg       Intake/Output Summary (Last 24 hours) at 02/07/2020 1056 Last data filed at 02/07/2020 0900 Gross per 24 hour  Intake 720 ml  Output 1400 ml  Net -680 ml   Weight change: -0.1 kg Exam:   General:  Pt is alert, follows commands appropriately, not in acute distress  HEENT: No icterus, No thrush, No neck mass, [place]/AT  Cardiovascular: RRR, S1/S2, no rubs, no gallops  Respiratory: bibasilar crackles, L>R. No wheeze  Abdomen: Soft/+BS, non tender, non distended, no guarding  Extremities: No edema, No lymphangitis, No petechiae, No rashes, no synovitis   Data Reviewed: I have personally reviewed following labs and imaging studies Basic Metabolic Panel: Recent Labs  Lab 02/05/20 0727 02/05/20 0924 02/06/20 0447 02/07/20 0533  NA 143  --  145 144  K 3.1*  --  3.7 3.6  CL 101  --  102 101  CO2 31  --  33* 33*  GLUCOSE 102*  --  140* 88  BUN 14  --  18 28*  CREATININE 0.88  --  0.99 1.09*  CALCIUM 9.0  --  8.8* 8.7*  MG  --  1.9  --  2.4   Liver Function Tests: No results for input(s): AST, ALT, ALKPHOS, BILITOT, PROT, ALBUMIN in the last 168 hours. No results for input(s):  LIPASE, AMYLASE in the last 168 hours. No results for input(s): AMMONIA in the last 168 hours. Coagulation Profile: Recent Labs  Lab 02/05/20 0727 02/06/20 0447 02/07/20 0533  INR 1.9* 2.8* 2.8*   CBC: Recent Labs  Lab 02/05/20 0727  WBC 7.6  NEUTROABS 5.9  HGB 10.0*  HCT 32.8*  MCV 98.5  PLT 225   Cardiac Enzymes: No results for input(s): CKTOTAL, CKMB, CKMBINDEX, TROPONINI in the last 168 hours. BNP: Invalid input(s): POCBNP CBG: No results for input(s): GLUCAP in the last 168 hours. HbA1C: No results for input(s): HGBA1C in the last 72 hours. Urine analysis:    Component Value Date/Time   COLORURINE YELLOW 10/25/2019 2052   APPEARANCEUR CLOUDY (A) 10/25/2019 2052   LABSPEC 1.009 10/25/2019 2052   PHURINE 5.0 10/25/2019 2052   GLUCOSEU NEGATIVE 10/25/2019 2052   GLUCOSEU NEGATIVE 04/10/2008 1246   HGBUR NEGATIVE 10/25/2019 2052   [place] NEGATIVE 10/25/2019 2052   KETONESUR NEGATIVE 10/25/2019 2052   PROTEINUR 30 (A) 10/25/2019 2052   UROBILINOGEN 0.2 10/20/2019 1404   NITRITE NEGATIVE 10/25/2019 2052   LEUKOCYTESUR NEGATIVE 10/25/2019 2052   Sepsis Labs: @LABRCNTIP (procalcitonin:4,lacticidven:4) ) Recent Results (from the past 240 hour(s))  SARS CORONAVIRUS 2 (Zane Pellecchia 6-24 HRS) Nasopharyngeal Nasopharyngeal Swab     Status: None   Collection Time: 02/05/20  8:47 AM   Specimen: Nasopharyngeal Swab  Result Value Ref Range Status   SARS Coronavirus 2 NEGATIVE NEGATIVE Final    Comment: (NOTE) SARS-CoV-2 target nucleic acids are NOT DETECTED. The SARS-CoV-2 RNA is generally detectable in upper and lower respiratory specimens during the acute phase of infection. Negative results do not preclude SARS-CoV-2 infection, do not rule out co-infections with other pathogens, and should not be used as the sole basis for treatment or other patient management decisions. Negative results must be combined with clinical observations, patient history, and epidemiological  information. The expected result is Negative. Fact Sheet for Patients: SugarRoll.be Fact Sheet for Healthcare Providers: https://www.woods-mathews.com/ This test is not yet approved or cleared by the [place] FDA and  has been authorized for detection and/or diagnosis of SARS-CoV-2 by FDA under an Emergency Use Authorization (EUA). This EUA will remain  in effect (meaning this test can be used) for the duration of the COVID-19 declaration under Section 56 4(b)(1) of the Act, 21 U.S.C. section 360bbb-3(b)(1), unless the authorization is terminated or revoked sooner. Performed at [facility], [place] [address] [place], [place] [address]      Scheduled Meds: . atorvastatin  40 mg Oral Daily  . budesonide (PULMICORT) nebulizer solution  0.5 mg Nebulization BID  . carvedilol  6.25 mg Oral BID WC  . donepezil  10 mg Oral Daily  . furosemide  40 mg Oral Daily  . ipratropium-albuterol  3 mL Nebulization Q6H  . mouth rinse  15 mL Mouth Rinse BID  . potassium chloride  20 mEq Oral Daily  . pregabalin  75 mg Oral QODAY  . sodium chloride flush  3 mL Intravenous Q12H  . warfarin  2.5  mg Oral ONCE-1600  . Warfarin - Pharmacist Dosing Inpatient   Does not apply q1800   Continuous Infusions: . sodium chloride      Procedures/Studies: DG Chest Port 1 View  Result Date: 02/05/2020 CLINICAL DATA:  Shortness of breath. EXAM: PORTABLE CHEST 1 VIEW COMPARISON:  01/01/2020. FINDINGS: Prior CABG and cardiac valve replacement. Stable cardiomegaly. Diffuse bilateral pulmonary interstitial prominence again noted. Small bilateral pleural effusions noted. No pneumothorax. IMPRESSION: Prior CABG and cardiac valve replacement.  Stable cardiomegaly. 2. Diffuse bilateral from interstitial prominence again noted suggesting interstitial edema. Small bilateral pleural effusions. Electronically Signed   By: Maisie Fus  Register   On: 02/05/2020 07:09    Catarina Hartshorn,  DO  [facility]  If 7PM-7AM, please contact night-coverage [email or web address] Password [facility] 02/07/2020, 10:56 AM   LOS: 2 days

## 2020-02-07 NOTE — TOC Initial Note (Addendum)
Transition of Care [facility]) - Initial/Assessment Note    Patient Details  Name: [name] MRN: [id number] Date of Birth: 17-Jun-1926  Transition of [facility] CM/SW Contact:    Annice Needy, LCSW Phone Number: 02/07/2020, 4:44 PM  Clinical Narrative:                 From Home with 24/7 care. Has had [facility] in past would like them again per Palliative NP, Helmut Muster. Per Palliative, also needs new PCP as current PCP is leaving in June and needs referral to [facility] for palliative.  Referral made with [facility] with Frances Furbish. Advised that coumadin levels/labe would need to be completed.  Referral made with Diannia Ruder with [facility] for Palliative services. [facility], NP, could not accept patient due to medications she is currently on.  TOC will continue to seek PCP for patient.  Granddaughter, Herbert Seta, Updated on referrals. Herbert Seta indicates that patient may need hospital bed .  Expected Discharge Plan: Home w Home Health Services     Patient Goals and CMS Choice        Expected Discharge Plan and Services Expected Discharge Plan: Home w Home Health Services       Living arrangements for the past 2 months: Single Family Home                           HH Arranged: RN, PT [facility] Agency: [facility] Date [facility] Agency Contacted: 02/07/20 Time HH Agency Contacted: [id number] Representative spoke with at [facility] Agency: Lorenza Chick  Prior Living Arrangements/Services Living arrangements for the past 2 months: Single Family Home Lives with:: Other (Comment)(Patient has 24/7 caregivers) Patient language and need for interpreter reviewed:: Yes Do you feel safe going back to the place where you live?: Yes      Need for Family Participation in Patient Care: Yes (Comment) Care giver support system in place?: Yes (comment)   Criminal Activity/Legal Involvement Pertinent to Current Situation/Hospitalization: No - Comment as needed  Activities of Daily Living Home  Assistive Devices/Equipment: Eyeglasses ADL Screening (condition at time of admission) Patient's cognitive ability adequate to safely complete daily activities?: Yes Is the patient deaf or have difficulty hearing?: No Does the patient have difficulty seeing, even when wearing glasses/contacts?: No Does the patient have difficulty concentrating, remembering, or making decisions?: No Patient able to express need for assistance with ADLs?: Yes Does the patient have difficulty dressing or bathing?: No Independently performs ADLs?: Yes (appropriate for developmental age) Does the patient have difficulty walking or climbing stairs?: No Weakness of Legs: Both Weakness of Arms/Hands: None  Permission Sought/Granted                  Emotional Assessment Appearance:: Appears stated age   Affect (typically observed): Appropriate Orientation: : Oriented to Self, Oriented to Situation Alcohol / Substance Use: Not Applicable Psych Involvement: No (comment)  Admission diagnosis:  Shortness of breath [R06.02] Acute on chronic systolic HF (heart failure) (HCC) [I50.23] Acute on chronic congestive heart failure, unspecified heart failure type [facility]) [I50.9] Patient Active Problem List   Diagnosis Date Noted  . Acute on chronic respiratory failure with hypoxia and hypercapnia (HCC) 02/07/2020  . Palliative care by specialist   . Acute on chronic combined systolic and diastolic CHF (congestive heart failure) (HCC) 02/06/2020  . Acute on chronic systolic HF (heart failure) (HCC) 02/05/2020  . Chronic respiratory failure with hypoxia and hypercapnia (HCC) 01/02/2020  .  Disorder of arteries and arterioles, unspecified ([place]) 01/01/2020  . Cough 12/24/2019  . History of COVID-19 11/27/2019  . Vitamin D deficiency 11/19/2019  . Localized swelling of left foot 11/19/2019  . Insomnia 11/19/2019  . Pneumonia due to COVID-19 virus 10/25/2019  . Hematoma of thigh, right, initial encounter 07/23/2018   . Chronic combined systolic and diastolic CHF (congestive heart failure) ([place]) 07/23/2018  . Supratherapeutic INR 07/23/2018  . Acute blood loss anemia 07/23/2018  . CKD (chronic kidney disease) stage 3, GFR 30-59 ml/min 07/23/2018  . COPD GOLD B symptoms/risk  07/23/2018  . Hematoma of right thigh 07/23/2018  . Pressure injury of skin 07/23/2018  . Hip fracture ([place]) 07/16/2018  . COPD exacerbation ([place]) 07/01/2018  . Right hip pain 07/01/2018  . Chronic respiratory failure with hypoxia, on home O2 therapy ([place]) 07/01/2018  . CHF (congestive heart failure) ([place]) 06/28/2018  . Acute diastolic heart failure ([place]) 06/27/2018  . Anemia 06/27/2018  . Congestive dilated cardiomyopathy ([place]) 09/29/2015  . Encounter for therapeutic drug monitoring 12/12/2013  . Transient confusion 04/16/2013  . Congestive heart failure ([place]) 11/28/2012  . Dehydration 11/27/2012  . Dementia ([place]) 11/27/2012  . Pneumonia 11/24/2012  . Aortic valve disorder 01/13/2011  . Carotid artery stenosis 10/13/2009  . S/P AVR (aortic valve replacement) 10/13/2009  . Hyperlipidemia 03/06/2009  . Essential hypertension 03/06/2009  . CAD, ARTERY BYPASS GRAFT 03/06/2009  . LBBB (left bundle branch block) 03/06/2009  . Cerebral artery occlusion with cerebral infarction ([place]) 03/06/2009   PCP:  Maryruth Hancock, MD Pharmacy:   [place], [place] [address] [place] [address] Phone: [phone] Fax: [phone]  [facility] [phone] - [place], [place] AT [place] [address] [place] [address] Phone: [phone] Fax: [phone]     Social Determinants of Health (SDOH) Interventions    Readmission Risk Interventions No flowsheet data found.

## 2020-02-07 NOTE — Plan of Care (Signed)
  Problem: Education: Goal: Knowledge of General Education information will improve Description Including pain rating scale, medication(s)/side effects and non-pharmacologic comfort measures Outcome: Progressing   Problem: Health Behavior/Discharge Planning: Goal: Ability to manage health-related needs will improve Outcome: Progressing   

## 2020-02-07 NOTE — Progress Notes (Addendum)
Palliative:  HPI: 86 y.o. female  with past medical history of mild dementia, systolic and diastolic CHF (EF 26-37% 06/20/87), AVR on warfarin, COPD, chronic respiratory failure on 4L nasal cannula, hyperlipidemia, CAD, LBBB, CKD stage 3 admitted on 02/05/2020 with worsening shortness of breath x 1-2 days and general worsening over 1-2 weeks due to fluid overload with acute on chronic CHF exacerbation and has responded to IV Lasix for diuresis. 3/25 with worsening shortness of breath and fluid overload but diuresing well with IV Lasix.    I met again today at [name]'s bedside. She is very fatigued and has had a very difficult day. Very short of breath this morning but appears to be resting comfortably currently. She does awaken but struggles to stay awake. Daughter, [name], at bedside. I have previously communicated to [facility] [place] for assistance with their requests to improve quality of life. Appreciate assistance. I have also communicated with [facility] NP Deloria Lair who has been working with [name] and [name].   [name] is very concerned with her mother's acute decline in the hospital and that her prognosis is very poor. I acknowledge that this is very disturbing that she declined so quickly. Will have to watch closely over next 1-2 days for progress. [name] expresses desire for for mother to be comfortable. We discussed continuing Lasix and breathing treatments as indicated and will also ensure as needed medications to ensure comfort if she has further decline.   [name] has called her sister to come visit as she is concerned with [name]'s acute decline and progression while hospitalized. She is certainly tenuous and risk of further decline so family are allowed more liberalized visitation as discussed with Multimedia programmer and front desk notified.   All questions/concerns addressed. Emotional support provided.   Exam: Sleepy, fatigued. No distress. Resting comfortably. Awakens and answers questions  appropriately. Breathing regular, unlabored on oxygen. Excellent urine output after IV Lasix. Abd flat.   Plan: - Continue treatment with Lasix to offload fluid.  - Comfort and minimizing suffering is priority with further decline.  - As needed morphine as a last resort for shortness of breath unrelieved by other measures of diuresis/nebulizer.  - As needed Xanax available as needed for anxiety.   [place], NP Palliative Medicine Team Pager [phone] (Please see [email or web address] for schedule) Team Phone [phone]    Greater than 50%  of this time was spent counseling and coordinating care related to the above assessment and plan

## 2020-02-07 NOTE — Progress Notes (Signed)
ANTICOAGULATION CONSULT NOTE -   Pharmacy Consult for warfarin Indication: Mechanical aortic valve replacement  Allergies  Allergen Reactions  . Codeine Nausea And Vomiting  . Penicillins Other (See Comments)    Unknown reaction - listed on MAR Has patient had a PCN reaction causing immediate rash, facial/tongue/throat swelling, SOB or lightheadedness with hypotension: Unknown Has patient had a PCN reaction causing severe rash involving mucus membranes or skin necrosis: Unknown Has patient had a PCN reaction that required hospitalization: Unknown Has patient had a PCN reaction occurring within the last 10 years: Unknown If all of the above answers are "NO", then may proceed with Cephalosporin use.    Patient Measurements: Weight: 116 lb 2.9 oz (52.7 kg)   Vital Signs: Temp: 98 F (36.7 C) (03/25 0519) Temp Source: Oral (03/25 0519) BP: 162/61 (03/25 0519) Pulse Rate: 64 (03/25 0519)  Labs: Recent Labs    02/05/20 0727 02/05/20 0924 02/06/20 0447 02/07/20 0533  HGB 10.0*  --   --   --   HCT 32.8*  --   --   --   PLT 225  --   --   --   LABPROT 22.1*  --  29.8* 29.4*  INR 1.9*  --  2.8* 2.8*  CREATININE 0.88  --  0.99 1.09*  TROPONINIHS 49* 59*  --   --     Estimated Creatinine Clearance: 26.6 mL/min (A) (by C-G formula based on SCr of 1.09 mg/dL (H)).   Medical History: Past Medical History:  Diagnosis Date  . Anemia   . Anxiety   . Arthritis   . Blood transfusion without reported diagnosis   . Carotid artery stenosis    Without infarction  . Cataract   . Chronic combined systolic (congestive) and diastolic (congestive) heart failure (HCC)   . COPD (chronic obstructive pulmonary disease) (HCC)   . Coronary artery disease   . Heart valve replaced by other means   . Hypercholesterolemia    Pure  . Hypertension    Unspecified  . LBBB (left bundle branch block)   . Macular degeneration (senile) of retina, unspecified   . On home O2   . Postsurgical  aortocoronary bypass status   . Stroke (HCC)   . Transient global amnesia   . Unspecified hereditary and idiopathic peripheral neuropathy   . Unspecified vitamin D deficiency     Medications:  Medications Prior to Admission  Medication Sig Dispense Refill Last Dose  . acetaminophen (TYLENOL) 650 MG CR tablet Take 650 mg by mouth every 8 (eight) hours as needed for pain.     Marland Kitchen ALPRAZolam (XANAX) 0.25 MG tablet Take 0.125-0.25 mg by mouth at bedtime as needed for anxiety.    02/05/2020 at 0400  . Ascorbic Acid (VITAMIN C) 500 MG CAPS Take 500 mg by mouth daily.   02/04/2020 at 0800  . atorvastatin (LIPITOR) 40 MG tablet TAKE ONE TABLET BY MOUTH ONCE DAILY. (Patient taking differently: Take 40 mg by mouth daily. ) 90 tablet 1 02/04/2020 at 1730  . baclofen (LIORESAL) 10 MG tablet Take 10 mg by mouth as needed for muscle spasms.     . carvedilol (COREG) 6.25 MG tablet Take 6.25 mg by mouth 2 (two) times daily with a meal.   02/04/2020 at 1730  . docusate sodium (COLACE) 100 MG capsule Take 100 mg by mouth at bedtime.    02/04/2020 at 1730  . donepezil (ARICEPT) 10 MG tablet TAKE ONE TABLET BY MOUTH ONCE DAILY. (Patient taking  differently: Take 10 mg by mouth daily. ) 30 tablet 0 02/04/2020 at 1730  . ergocalciferol (VITAMIN D2) 50000 UNITS capsule Take 50,000 Units by mouth every Tuesday.    01/29/2020 at 0800  . ferrous sulfate 325 (65 FE) MG EC tablet Take 325 mg by mouth daily.    02/04/2020 at 0800  . furosemide (LASIX) 20 MG tablet TAKE ONE TABLET BY MOUTH ONCE DAILY.TAKE EXTRA IF NEEDED. (Patient taking differently: Take 20 mg by mouth 2 (two) times daily as needed for fluid. Patient takes 1 tablet by mouth every day, but may take an additional tablet if needed for fluid build-up) 180 tablet 0 02/04/2020 at 0800  . HYDROcodone-acetaminophen (NORCO) 7.5-325 MG tablet Take 1 tablet by mouth every 6 (six) hours as needed for moderate pain.     . hydrocortisone (ANUSOL-HC) 2.5 % rectal cream Place 1  application rectally 3 (three) times daily as needed for hemorrhoids or anal itching.     . meclizine (ANTIVERT) 25 MG tablet Take 25 mg by mouth 3 (three) times daily as needed for dizziness.     . Melatonin 3 MG TABS Take 1 tablet by mouth at bedtime.   02/04/2020 at 2130  . methylPREDNISolone (MEDROL) 4 MG tablet Take 4 mg by mouth 2 (two) times daily as needed.     . multivitamin-lutein (OCUVITE-LUTEIN) CAPS capsule Take 1 capsule by mouth daily.   02/04/2020 at 0800  . ondansetron (ZOFRAN) 4 MG tablet Take 4 mg by mouth every 8 (eight) hours as needed for nausea or vomiting.     . polyethylene glycol (MIRALAX / GLYCOLAX) 17 g packet Take 17 g by mouth daily as needed for moderate constipation.     . pregabalin (LYRICA) 75 MG capsule Take 1 capsule (75 mg total) by mouth every other day. 15 capsule 5 02/03/2020  . spironolactone (ALDACTONE) 25 MG tablet TAKE (1/2) TABLET BY MOUTH DAILY. (Patient taking differently: Take 12.5 mg by mouth daily. ) 45 tablet 6 02/04/2020 at 0800  . traMADol (ULTRAM) 50 MG tablet Take 50 mg by mouth every 6 (six) hours as needed for moderate pain.     . traZODone (DESYREL) 50 MG tablet Take 0.5-1 tablets (25-50 mg total) by mouth at bedtime as needed for sleep. 30 tablet 3   . umeclidinium-vilanterol (ANORO ELLIPTA) 62.5-25 MCG/INH AEPB Inhale 1 puff into the lungs daily.    02/05/2020 at 0400  . warfarin (COUMADIN) 5 MG tablet TAKE 1/2 TO 1 TABLET ONCE DAILY AS DIRECTED BY COUMADIN CLINIC. (Patient taking differently: Take 2.5-5 mg by mouth daily. Patient takes 5mg  on Tuesdays and Saturdays; on all other days, patient takes 2.5mg ) 45 tablet 0 02/04/2020 at 1730  . zinc sulfate 50 MG CAPS capsule Take 50 mg by mouth 2 (two) times daily.    02/04/2020 at 1730    Assessment: Pharmacy consulted to dose warfarin in patient with mechanical aortic valve replacement.  Patient's home dose listed as 5 mg Tue-Sat and 2.5 mg ROW.  INR on admission   Goal of Therapy:  INR goal  2.2-3.0 per Anticoag clinic Monitor platelets by anticoagulation protocol: Yes   Plan:  Warfarin 2.5 mg x 1 dose Monitor daily INR and s/s of bleeding.  Margot Ables, PharmD Clinical Pharmacist 02/07/2020 8:56 AM

## 2020-02-08 LAB — BASIC METABOLIC PANEL
Anion gap: 11 (ref 5–15)
BUN: 26 mg/dL — ABNORMAL HIGH (ref 8–23)
CO2: 34 mmol/L — ABNORMAL HIGH (ref 22–32)
Calcium: 8.6 mg/dL — ABNORMAL LOW (ref 8.9–10.3)
Chloride: 98 mmol/L (ref 98–111)
Creatinine, Ser: 1.02 mg/dL — ABNORMAL HIGH (ref 0.44–1.00)
GFR calc Af Amer: 56 mL/min — ABNORMAL LOW (ref >60)
GFR calc non Af Amer: 48 mL/min — ABNORMAL LOW (ref >60)
Glucose, Bld: 81 mg/dL (ref 70–99)
Potassium: 3.8 mmol/L (ref 3.5–5.1)
Sodium: 143 mmol/L (ref 135–145)

## 2020-02-08 LAB — CBC
HCT: 30.8 % — ABNORMAL LOW (ref 36.0–46.0)
Hemoglobin: 9.6 g/dL — ABNORMAL LOW (ref 12.0–15.0)
MCH: 30.5 pg (ref 26.0–34.0)
MCHC: 31.2 g/dL (ref 30.0–36.0)
MCV: 97.8 fL (ref 80.0–100.0)
Platelets: 216 10*3/uL (ref 150–400)
RBC: 3.15 MIL/uL — ABNORMAL LOW (ref 3.87–5.11)
RDW: 13.3 % (ref 11.5–15.5)
WBC: 8 10*3/uL (ref 4.0–10.5)
nRBC: 0 % (ref 0.0–0.2)

## 2020-02-08 LAB — PROTIME-INR
INR: 2.7 — ABNORMAL HIGH (ref 0.8–1.2)
Prothrombin Time: 28.3 seconds — ABNORMAL HIGH (ref 11.4–15.2)

## 2020-02-08 LAB — PROCALCITONIN: Procalcitonin: <0.1 ng/mL

## 2020-02-08 MED ORDER — POLYETHYLENE GLYCOL 3350 17 G PO PACK
17.0000 g | PACK | Freq: Every day | ORAL | Status: DC
  Filled 2020-02-08: qty 1

## 2020-02-08 MED ORDER — FUROSEMIDE 10 MG/ML IJ SOLN
40.0000 mg | Freq: Once | INTRAMUSCULAR | Status: AC
  Administered 2020-02-08: 17:00:00 40 mg via INTRAVENOUS
  Filled 2020-02-08: qty 4

## 2020-02-08 MED ORDER — FUROSEMIDE 40 MG PO TABS
40.0000 mg | ORAL_TABLET | Freq: Every day | ORAL | Status: DC
  Administered 2020-02-09: 40 mg via ORAL
  Filled 2020-02-08: qty 1

## 2020-02-08 MED ORDER — FUROSEMIDE 10 MG/ML IJ SOLN
40.0000 mg | Freq: Two times a day (BID) | INTRAMUSCULAR | Status: DC
  Administered 2020-02-08: 40 mg via INTRAVENOUS
  Filled 2020-02-08: qty 4

## 2020-02-08 MED ORDER — WARFARIN SODIUM 2.5 MG PO TABS
2.5000 mg | ORAL_TABLET | Freq: Once | ORAL | Status: AC
  Administered 2020-02-08: 17:00:00 2.5 mg via ORAL
  Filled 2020-02-08: qty 1

## 2020-02-08 MED ORDER — FUROSEMIDE 10 MG/ML IJ SOLN
40.0000 mg | Freq: Once | INTRAMUSCULAR | Status: DC
  Filled 2020-02-08: qty 4

## 2020-02-08 MED ORDER — BISACODYL 10 MG RE SUPP
10.0000 mg | Freq: Once | RECTAL | Status: DC
  Filled 2020-02-08: qty 1

## 2020-02-08 NOTE — TOC Progression Note (Signed)
Transition of Care [facility]) - Progression Note    Patient Details  Name: [name] MRN: [id number] Date of Birth: 07-07-1926  Transition of Care [facility]) CM/SW Contact  Ewing Schlein, LCSW Phone Number: 02/08/2020, 2:29 PM  Clinical Narrative: CSW spoke with patient's daughter, Lisbeth Ply, to follow up with patient needs. Daughter informed CSW Marcell Anger has already reached out the the family. Daughter stated patient is in need of a hospital bed due to difficulty breathing while laying down. Provided daughter with list of PCPs in [facility]. Notified physician of family requesting a hospital for the patient. Spoke with Olegario Messier with [employer] to set up hospital bed referral.   Expected Discharge Plan: Home w Home Health Services    Expected Discharge Plan and Services Expected Discharge Plan: Home w Home Health Services     Living arrangements for the past 2 months: Single Family Home                   HH Arranged: RN, PT [facility] Agency: [facility] Date [facility] Agency Contacted: 02/07/20 Time HH Agency Contacted: [phone] Representative spoke with at [facility] Agency: Lorenza Chick   Readmission Risk Interventions No flowsheet data found.

## 2020-02-08 NOTE — Care Management (Signed)
Patient requires frequent re-positioning of the body in ways that cannot be achieved with an ordinary bed or wedge pillow, to eliminate pain, reduce pressure, and the head of the bed to be elevated more than 30 degrees most of the time due to CHF and acute and acute on chronic respiratory failure with hypoxia and hypercapnia

## 2020-02-08 NOTE — Discharge Summary (Signed)
Physician Discharge Summary  [name] YBO:[id number] DOB: 27-Mar-1926 DOA: 02/05/2020  PCP: Wandra Feinstein, MD  Admit date: 02/05/2020 Discharge date: 02/09/2020  Admitted From: Home Disposition:  Home  Recommendations for Outpatient Follow-up:  1. Follow up with PCP in 1-2 weeks 2. Please obtain BMP/CBC in one week   Home Health: yes Equipment/Devices: HHPT, RN, 3-4L [place]  Discharge Condition: Stable CODE STATUS: DNR Diet recommendation: Heart Healthy   Brief/Interim Summary: 86 year old female with a history of systolic and diastolic CHF, chronic respiratory failure on 4 L oxygen, hyperlipidemia, coronary artery disease, LBBB, coronary disease, CKD stage III, AVR presented with 1 to 2 days of worsening shortness of breath. The patient states that she has had intermittent shortness of breath for at least 2 weeks with worsening over the last 1 to 2 days prior to admission. She denies any fevers, chills, chest pain, nausea, vomiting, diarrhea, abdominal pain. Upon presentation, the patient was noted to be fluid overloaded with chest x-ray showing diffuse bilateral interstitial prominence and BNP 1712. She was started on IV furosemide with good clinical effect. During the am 3/25, she became more dyspneic. Repeat CXR and labs were ordered and duonebs were started.  Discharge Diagnoses:  Acute on chronic systolic and diastolic CHF -pt suppose to take 20 mg po bid at home, but she only takes it qday -01/22/2019 echo EF 25-30%, diffuse HK, moderate MR, moderate AI -Continue IV furosemide--dose increased to 40 bid when pt had resp distress 3/25 -Remains fluid overloaded with JVD -Daily weights -Accurate I's and O's--NEG 3.1L -SARS-CoV2--neg -d/c home with lasix 40 mg daily  Acute onChronic respiratory failure with hypoxia and hypercarbia -usually on 4L at home -pt on 3L at time of d/c with saturation 97-98% -3/25--repeat CXR--personally reviewed--increased interstitial  markings, sm pleural effusions -3/26--sob improved  CKD stage IIIb -Baseline creatinine 0.9-1.2 -Monitor with diuresis -serum creatinine 1.10 on day of d/c  COPD -Stable without wheezing -DiscontinuedIV steroids 3/24 -continued duonebs -continued pulmicort  Coronary artery disease status post CABG -No chest pain -Continue carvedilol -continue statin  Status post AVR -Continue warfarin with pharmacy assistance  Hypokalemia -Repleted -Magnesium 2.4  Hypertension -Blood pressure acceptable -Continue carvedilol  Cognitive impairment -Continue Aricept  Hyperlipidemia -Continue statin  LBBB -chronic    Discharge Instructions   Allergies as of 02/09/2020      Reactions   Codeine Nausea And Vomiting   Penicillins Other (See Comments)   Unknown reaction - listed on MAR Has patient had a PCN reaction causing immediate rash, facial/tongue/throat swelling, SOB or lightheadedness with hypotension: Unknown Has patient had a PCN reaction causing severe rash involving mucus membranes or skin necrosis: Unknown Has patient had a PCN reaction that required hospitalization: Unknown Has patient had a PCN reaction occurring within the last 10 years: Unknown If all of the above answers are "NO", then may proceed with Cephalosporin use.      Medication List    TAKE these medications   acetaminophen 650 MG CR tablet Commonly known as: TYLENOL Take 650 mg by mouth every 8 (eight) hours as needed for pain.   ALPRAZolam 0.25 MG tablet Commonly known as: XANAX Take 0.125-0.25 mg by mouth at bedtime as needed for anxiety.   Anoro Ellipta 62.5-25 MCG/INH Aepb Generic drug: umeclidinium-vilanterol Inhale 1 puff into the lungs daily.   atorvastatin 40 MG tablet Commonly known as: LIPITOR TAKE ONE TABLET BY MOUTH ONCE DAILY.   baclofen 10 MG tablet Commonly known as: LIORESAL Take 10 mg by  mouth as needed for muscle spasms.   carvedilol 6.25 MG tablet Commonly  known as: COREG Take 6.25 mg by mouth 2 (two) times daily with a meal.   docusate sodium 100 MG capsule Commonly known as: COLACE Take 100 mg by mouth at bedtime.   donepezil 10 MG tablet Commonly known as: ARICEPT TAKE ONE TABLET BY MOUTH ONCE DAILY.   ergocalciferol 1.25 MG (50000 UT) capsule Commonly known as: VITAMIN D2 Take 50,000 Units by mouth every Tuesday.   ferrous sulfate 325 (65 FE) MG EC tablet Take 325 mg by mouth daily.   furosemide 40 MG tablet Commonly known as: LASIX Take 1 tablet (40 mg total) by mouth daily. What changed:   medication strength  how much to take  how to take this  when to take this  additional instructions   HYDROcodone-acetaminophen 7.5-325 MG tablet Commonly known as: NORCO Take 1 tablet by mouth every 6 (six) hours as needed for moderate pain.   hydrocortisone 2.5 % rectal cream Commonly known as: ANUSOL-HC Place 1 application rectally 3 (three) times daily as needed for hemorrhoids or anal itching.   levalbuterol 0.63 MG/3ML nebulizer solution Commonly known as: XOPENEX Take 3 mLs (0.63 mg total) by nebulization every 4 (four) hours as needed for wheezing or shortness of breath. What changed: See the new instructions.   meclizine 25 MG tablet Commonly known as: ANTIVERT Take 25 mg by mouth 3 (three) times daily as needed for dizziness.   melatonin 3 MG Tabs tablet Take 1 tablet by mouth at bedtime.   methylPREDNISolone 4 MG tablet Commonly known as: MEDROL Take 4 mg by mouth 2 (two) times daily as needed.   multivitamin-lutein Caps capsule Take 1 capsule by mouth daily.   ondansetron 4 MG tablet Commonly known as: ZOFRAN Take 4 mg by mouth every 8 (eight) hours as needed for nausea or vomiting.   polyethylene glycol 17 g packet Commonly known as: MIRALAX / GLYCOLAX Take 17 g by mouth daily as needed for moderate constipation.   pregabalin 75 MG capsule Commonly known as: Lyrica Take 1 capsule (75 mg total)  by mouth every other day.   spironolactone 25 MG tablet Commonly known as: ALDACTONE TAKE (1/2) TABLET BY MOUTH DAILY. What changed: See the new instructions.   traMADol 50 MG tablet Commonly known as: ULTRAM Take 50 mg by mouth every 6 (six) hours as needed for moderate pain.   traZODone 50 MG tablet Commonly known as: DESYREL Take 0.5-1 tablets (25-50 mg total) by mouth at bedtime as needed for sleep.   Vitamin C 500 MG Caps Take 500 mg by mouth daily.   warfarin 5 MG tablet Commonly known as: COUMADIN Take as directed. If you are unsure how to take this medication, talk to your nurse or doctor. Original instructions: TAKE 1/2 TO 1 TABLET ONCE DAILY AS DIRECTED BY COUMADIN CLINIC. What changed: See the new instructions.   zinc sulfate 50 MG Caps capsule Take 50 mg by mouth 2 (two) times daily.            Durable Medical Equipment  (From admission, onward)         Start     Ordered   02/08/20 1419  For home use only DME Hospital bed  Once    Question Answer Comment  Length of Need Lifetime   Patient has (list medical condition): CHF, deconditioning   The above medical condition requires: Patient requires the ability to reposition frequently  Head must be elevated greater than: 30 degrees   Bed type Semi-electric      02/08/20 1419         Follow-up Information    Oval Linseyondiego, Richard, MD Follow up on 03/03/2020.   Specialty: Internal Medicine Why: You are scheduled for a new patient appointment with Dr. Janna Archondiego on Monday, April 19th.  Please arrive at 9:00am for your 9:30am appointment. Remember to bring your insurance cards with you. Thanks. Contact information: [address] [place] [address] [phone]          Allergies  Allergen Reactions  . Codeine Nausea And Vomiting  . Penicillins Other (See Comments)    Unknown reaction - listed on MAR Has patient had a PCN reaction causing immediate rash, facial/tongue/throat swelling, SOB or  lightheadedness with hypotension: Unknown Has patient had a PCN reaction causing severe rash involving mucus membranes or skin necrosis: Unknown Has patient had a PCN reaction that required hospitalization: Unknown Has patient had a PCN reaction occurring within the last 10 years: Unknown If all of the above answers are "NO", then may proceed with Cephalosporin use.    Consultations: none  Procedures/Studies: DG CHEST PORT 1 VIEW  Result Date: 02/07/2020 CLINICAL DATA:  Increased shortness of breath. EXAM: PORTABLE CHEST 1 VIEW COMPARISON:  02/05/2020 FINDINGS: Cardiomediastinal contours are obscured, grossly unchanged. Findings of median sternotomy, CABG and valvular replacement similar to the prior study. Medial right hemidiaphragm and left hemidiaphragm are obscured. Increased interstitial markings persist along with small bilateral pleural effusions. No acute bone process. IMPRESSION: Persistent findings of CHF with small bilateral pleural effusions and pulmonary edema. Basilar airspace disease left greater than right likely atelectasis. Difficult to exclude developing infection at the left lung base. Electronically Signed   By: Donzetta KohutGeoffrey  Wile M.D.   On: 02/07/2020 11:13   DG Chest Port 1 View  Result Date: 02/05/2020 CLINICAL DATA:  Shortness of breath. EXAM: PORTABLE CHEST 1 VIEW COMPARISON:  01/01/2020. FINDINGS: Prior CABG and cardiac valve replacement. Stable cardiomegaly. Diffuse bilateral pulmonary interstitial prominence again noted. Small bilateral pleural effusions noted. No pneumothorax. IMPRESSION: Prior CABG and cardiac valve replacement.  Stable cardiomegaly. 2. Diffuse bilateral from interstitial prominence again noted suggesting interstitial edema. Small bilateral pleural effusions. Electronically Signed   By: Maisie Fushomas  Register   On: 02/05/2020 07:09        Discharge Exam: Vitals:   02/09/20 0730 02/09/20 0735  BP:    Pulse:    Resp:    Temp:    SpO2: 98% 98%    Vitals:   02/09/20 0500 02/09/20 0729 02/09/20 0730 02/09/20 0735  BP:      Pulse:      Resp:      Temp:      TempSrc:      SpO2:   98% 98%  Weight: 48.4 kg     Height:  5\' 2"  (1.575 m)      General: Pt is alert, awake, not in acute distress Cardiovascular: RRR, S1/S2 +, no rubs, no gallops Respiratory: bibasilar crackles. No wheeze Abdominal: Soft, NT, ND, bowel sounds + Extremities: no edema, no cyanosis   The results of significant diagnostics from this hospitalization (including imaging, microbiology, ancillary and laboratory) are listed below for reference.    Significant Diagnostic Studies: DG CHEST PORT 1 VIEW  Result Date: 02/07/2020 CLINICAL DATA:  Increased shortness of breath. EXAM: PORTABLE CHEST 1 VIEW COMPARISON:  02/05/2020 FINDINGS: Cardiomediastinal contours are obscured, grossly unchanged. Findings of median sternotomy,  CABG and valvular replacement similar to the prior study. Medial right hemidiaphragm and left hemidiaphragm are obscured. Increased interstitial markings persist along with small bilateral pleural effusions. No acute bone process. IMPRESSION: Persistent findings of CHF with small bilateral pleural effusions and pulmonary edema. Basilar airspace disease left greater than right likely atelectasis. Difficult to exclude developing infection at the left lung base. Electronically Signed   By: Zetta Bills M.D.   On: 02/07/2020 11:13   DG Chest Port 1 View  Result Date: 02/05/2020 CLINICAL DATA:  Shortness of breath. EXAM: PORTABLE CHEST 1 VIEW COMPARISON:  01/01/2020. FINDINGS: Prior CABG and cardiac valve replacement. Stable cardiomegaly. Diffuse bilateral pulmonary interstitial prominence again noted. Small bilateral pleural effusions noted. No pneumothorax. IMPRESSION: Prior CABG and cardiac valve replacement.  Stable cardiomegaly. 2. Diffuse bilateral from interstitial prominence again noted suggesting interstitial edema. Small bilateral pleural  effusions. Electronically Signed   By: Marcello Moores  Register   On: 02/05/2020 07:09     Microbiology: Recent Results (from the past 240 hour(s))  SARS CORONAVIRUS 2 (Heidie Krall 6-24 HRS) Nasopharyngeal Nasopharyngeal Swab     Status: None   Collection Time: 02/05/20  8:47 AM   Specimen: Nasopharyngeal Swab  Result Value Ref Range Status   SARS Coronavirus 2 NEGATIVE NEGATIVE Final    Comment: (NOTE) SARS-CoV-2 target nucleic acids are NOT DETECTED. The SARS-CoV-2 RNA is generally detectable in upper and lower respiratory specimens during the acute phase of infection. Negative results do not preclude SARS-CoV-2 infection, do not rule out co-infections with other pathogens, and should not be used as the sole basis for treatment or other patient management decisions. Negative results must be combined with clinical observations, patient history, and epidemiological information. The expected result is Negative. Fact Sheet for Patients: SugarRoll.be Fact Sheet for Healthcare Providers: https://www.woods-mathews.com/ This test is not yet approved or cleared by the [place] FDA and  has been authorized for detection and/or diagnosis of SARS-CoV-2 by FDA under an Emergency Use Authorization (EUA). This EUA will remain  in effect (meaning this test can be used) for the duration of the COVID-19 declaration under Section 56 4(b)(1) of the Act, 21 U.S.C. section 360bbb-3(b)(1), unless the authorization is terminated or revoked sooner. Performed at [facility], [place] [address] [place], [place] [address]      Labs: Basic Metabolic Panel: Recent Labs  Lab 02/05/20 [phone] 02/05/20 3976 02/05/20 7341 02/06/20 0447 02/06/20 0447 02/07/20 0533 02/07/20 0533 02/08/20 0523 02/09/20 0632  NA 143  --   --  145  --  144  --  143 143  K 3.1*   < >  --  3.7   < > 3.6   < > 3.8 3.6  CL 101  --   --  102  --  101  --  98 97*  CO2 31  --   --  33*  --  33*   --  34* 35*  GLUCOSE 102*  --   --  140*  --  88  --  81 91  BUN 14  --   --  18  --  28*  --  26* 26*  CREATININE 0.88  --   --  0.99  --  1.09*  --  1.02* 1.10*  CALCIUM 9.0  --   --  8.8*  --  8.7*  --  8.6* 8.8*  MG  --   --  1.9  --   --  2.4  --   --  2.4   < > = values in this interval not displayed.   Liver Function Tests: No results for input(s): AST, ALT, ALKPHOS, BILITOT, PROT, ALBUMIN in the last 168 hours. No results for input(s): LIPASE, AMYLASE in the last 168 hours. No results for input(s): AMMONIA in the last 168 hours. CBC: Recent Labs  Lab 02/05/20 0727 02/07/20 1234 02/08/20 0523 02/09/20 0632  WBC 7.6 13.6* 8.0 7.8  NEUTROABS 5.9  --   --   --   HGB 10.0* 10.7* 9.6* 9.8*  HCT 32.8* 35.3* 30.8* 32.3*  MCV 98.5 99.4 97.8 96.4  PLT 225 244 216 213   Cardiac Enzymes: No results for input(s): CKTOTAL, CKMB, CKMBINDEX, TROPONINI in the last 168 hours. BNP: Invalid input(s): POCBNP CBG: No results for input(s): GLUCAP in the last 168 hours.  Time coordinating discharge:  36 minutes  Signed:  Catarina Hartshorn, DO [facility] Pager: [phone] 02/09/2020, 8:30 AM

## 2020-02-08 NOTE — Progress Notes (Signed)
ANTICOAGULATION CONSULT NOTE -   Pharmacy Consult for warfarin Indication: Mechanical aortic valve replacement   Patient Measurements: Weight: 116 lb 2.9 oz (52.7 kg)   Vital Signs: Temp: 97.7 F (36.5 C) (03/26 0545) Temp Source: Oral (03/26 0545) BP: 148/49 (03/26 0545) Pulse Rate: 68 (03/26 0545)  Labs: Recent Labs    02/05/20 0924 02/06/20 0447 02/07/20 0533 02/07/20 1234 02/08/20 0523  HGB  --   --   --  10.7*  --   HCT  --   --   --  35.3*  --   PLT  --   --   --  244  --   LABPROT  --  29.8* 29.4*  --  28.3*  INR  --  2.8* 2.8*  --  2.7*  CREATININE  --  0.99 1.09*  --  1.02*  TROPONINIHS 59*  --   --   --   --     Estimated Creatinine Clearance: 28.4 mL/min (A) (by C-G formula based on SCr of 1.02 mg/dL (H)).   Assessment: Pharmacy consulted to dose warfarin in patient with mechanical aortic valve replacement.  Patient's home dose listed as 5 mg Tue-Sat and 2.5 mg ROW.  INR on admission    02/08/20 INR: 2.7  Goal of Therapy:  INR goal 2.2-3.0 per Anticoag clinic Monitor platelets by anticoagulation protocol: Yes   Plan:  Repeat  warfarin 2.5 mg x 1 dose today  Monitor daily INR and s/s of bleeding.  Tama High, Pharm. D. Clinical Pharmacist 02/08/2020 9:16 AM

## 2020-02-08 NOTE — Plan of Care (Signed)
  Problem: Education: Goal: Ability to demonstrate management of disease process will improve Outcome: Progressing Goal: Ability to verbalize understanding of medication therapies will improve Outcome: Progressing   Problem: Coping: Goal: Level of anxiety will decrease Outcome: Progressing

## 2020-02-08 NOTE — Progress Notes (Signed)
PROGRESS NOTE  [name] QQP:[id number] DOB: Oct 01, 1926 DOA: 02/05/2020 PCP: Maryruth Hancock, MD  Brief History: 86 year old female with a history of systolic and diastolic CHF, chronic respiratory failure on 4 L oxygen, hyperlipidemia, coronary artery disease, LBBB, coronary disease, CKD stage III, AVR presented with 1 to 2 days of worsening shortness of breath. The patient states that she has had intermittent shortness of breath for at least 2 weeks with worsening over the last 1 to 2 days prior to admission. She denies any fevers, chills, chest pain, nausea, vomiting, diarrhea, abdominal pain. Upon presentation, the patient was noted to be fluid overloaded with chest x-ray showing diffuse bilateral interstitial prominence and BNP 1712. She was started on IV furosemide with good clinical effect. During the am 3/25, she became more dyspneic.  Repeat CXR and labs were ordered and duonebs were started.  Assessment/Plan: Acute on chronic systolic and diastolic CHF -05/15/2457 echo EF 25-30%, diffuse HK, moderate MR, moderate AI -Continue IV furosemide -Remains fluid overloaded with JVD -Daily weights -Accurate I's and O's--NEG 2.5L -SARS-CoV2--neg  Acute on Chronic respiratory failure with hypoxia and hypercarbia -Patient is currently on 4 L nasal cannula which is her home demand -more dysneic today -3/25--repeat CXR--personally reviewed--increased interstitial markings, sm pleural effusions  CKD stage IIIb -Baseline creatinine 0.9-1.2 -Monitor with diuresis  COPD -Stable without wheezing -Discontinued IV steroids 3/24 -continue duonebs -continue pulmicort  Coronary artery disease status post CABG -No chest pain -Continue carvedilol -continue statin  Status post AVR -Continue warfarin with pharmacy assistance  Hypokalemia -Repleted -Magnesium 2.4  Hypertension -Blood pressure acceptable -Continue carvedilol  Cognitive impairment -Continue  Aricept  Hyperlipidemia -Continue statin  LBBB -chronic   Disposition Plan: Patient From: Hom D/C Place: Home- 3/27 if more clinically euvolemic Barriers: Not Clinically Stable--remains fluid overloaded;  Respiratory distress 3/25  Family Communication:Daughter updatedat bedside3/26  Consultants:none  Code Status: DNR  DVT Prophylaxis:coumadin   Procedures: As Listed in Progress Note Above  Antibiotics: None   Subjective: Pt is breathing better, but still occasionally sob.  Cannot lay totally flat.  Denies f/c, cp, n/v/d. Complains of constipation.   Objective: Vitals:   02/07/20 2154 02/08/20 0545 02/08/20 0806 02/08/20 0813  BP: (!) 143/46 (!) 148/49    Pulse: 64 68    Resp: 16 16    Temp: 98.4 F (36.9 C) 97.7 F (36.5 C)    TempSrc: Oral Oral    SpO2: 96% 96% 94% 98%  Weight:        Intake/Output Summary (Last 24 hours) at 02/08/2020 0903 Last data filed at 02/07/2020 1700 Gross per 24 hour  Intake 120 ml  Output 1500 ml  Net -1380 ml   Weight change:  Exam:   General:  Pt is alert, follows commands appropriately, not in acute distress  HEENT: No icterus, No thrush, No neck mass, [employer]/AT  Cardiovascular: RRR, S1/S2, no rubs, no gallops  Respiratory: bibasilar crackles, L>R  Abdomen: Soft/+BS, non tender, non distended, no guarding  Extremities: No edema, No lymphangitis, No petechiae, No rashes, no synovitis   Data Reviewed: I have personally reviewed following labs and imaging studies Basic Metabolic Panel: Recent Labs  Lab 02/05/20 0727 02/05/20 0924 02/06/20 0447 02/07/20 0533 02/08/20 0523  NA 143  --  145 144 143  K 3.1*  --  3.7 3.6 3.8  CL 101  --  102 101 98  CO2 31  --  33* 33* 34*  GLUCOSE 102*  --  140* 88 81  BUN 14  --  18 28* 26*  CREATININE 0.88  --  0.99 1.09* 1.02*  CALCIUM 9.0  --  8.8* 8.7* 8.6*  MG  --  1.9  --  2.4  --    Liver Function Tests: No results for input(s): AST, ALT,  ALKPHOS, BILITOT, PROT, ALBUMIN in the last 168 hours. No results for input(s): LIPASE, AMYLASE in the last 168 hours. No results for input(s): AMMONIA in the last 168 hours. Coagulation Profile: Recent Labs  Lab 02/05/20 0727 02/06/20 0447 02/07/20 0533 02/08/20 0523  INR 1.9* 2.8* 2.8* 2.7*   CBC: Recent Labs  Lab 02/05/20 0727 02/07/20 1234  WBC 7.6 13.6*  NEUTROABS 5.9  --   HGB 10.0* 10.7*  HCT 32.8* 35.3*  MCV 98.5 99.4  PLT 225 244   Cardiac Enzymes: No results for input(s): CKTOTAL, CKMB, CKMBINDEX, TROPONINI in the last 168 hours. BNP: Invalid input(s): POCBNP CBG: No results for input(s): GLUCAP in the last 168 hours. HbA1C: No results for input(s): HGBA1C in the last 72 hours. Urine analysis:    Component Value Date/Time   COLORURINE YELLOW 10/25/2019 2052   APPEARANCEUR CLOUDY (A) 10/25/2019 2052   LABSPEC 1.009 10/25/2019 2052   PHURINE 5.0 10/25/2019 2052   GLUCOSEU NEGATIVE 10/25/2019 2052   GLUCOSEU NEGATIVE 04/10/2008 1246   HGBUR NEGATIVE 10/25/2019 2052   BILIRUBINUR NEGATIVE 10/25/2019 2052   KETONESUR NEGATIVE 10/25/2019 2052   PROTEINUR 30 (A) 10/25/2019 2052   UROBILINOGEN 0.2 10/20/2019 1404   NITRITE NEGATIVE 10/25/2019 2052   LEUKOCYTESUR NEGATIVE 10/25/2019 2052   Sepsis Labs: @LABRCNTIP (procalcitonin:4,lacticidven:4) ) Recent Results (from the past 240 hour(s))  SARS CORONAVIRUS 2 (Rasheena Talmadge 6-24 HRS) Nasopharyngeal Nasopharyngeal Swab     Status: None   Collection Time: 02/05/20  8:47 AM   Specimen: Nasopharyngeal Swab  Result Value Ref Range Status   SARS Coronavirus 2 NEGATIVE NEGATIVE Final    Comment: (NOTE) SARS-CoV-2 target nucleic acids are NOT DETECTED. The SARS-CoV-2 RNA is generally detectable in upper and lower respiratory specimens during the acute phase of infection. Negative results do not preclude SARS-CoV-2 infection, do not rule out co-infections with other pathogens, and should not be used as the sole basis for  treatment or other patient management decisions. Negative results must be combined with clinical observations, patient history, and epidemiological information. The expected result is Negative. Fact Sheet for Patients: 02/07/20 Fact Sheet for Healthcare Providers: HairSlick.no This test is not yet approved or cleared by the quierodirigir.com FDA and  has been authorized for detection and/or diagnosis of SARS-CoV-2 by FDA under an Emergency Use Authorization (EUA). This EUA will remain  in effect (meaning this test can be used) for the duration of the COVID-19 declaration under Section 56 4(b)(1) of the Act, 21 U.S.C. section 360bbb-3(b)(1), unless the authorization is terminated or revoked sooner. Performed at [facility], [address], [place], [place]      Scheduled Meds: . atorvastatin  40 mg Oral Daily  . budesonide (PULMICORT) nebulizer solution  0.5 mg Nebulization BID  . carvedilol  6.25 mg Oral BID WC  . donepezil  10 mg Oral Daily  . furosemide  40 mg Intravenous Once  . furosemide  40 mg Intravenous Once  . [START ON 02/09/2020] furosemide  40 mg Oral Daily  . ipratropium-albuterol  3 mL Nebulization TID  . mouth rinse  15 mL Mouth Rinse BID  . potassium chloride  20 mEq Oral Daily  . pregabalin  75 mg Oral QODAY  . sodium chloride flush  3 mL Intravenous Q12H  . Warfarin - Pharmacist Dosing Inpatient   Does not apply q1800   Continuous Infusions: . sodium chloride      Procedures/Studies: DG CHEST PORT 1 VIEW  Result Date: 02/07/2020 CLINICAL DATA:  Increased shortness of breath. EXAM: PORTABLE CHEST 1 VIEW COMPARISON:  02/05/2020 FINDINGS: Cardiomediastinal contours are obscured, grossly unchanged. Findings of median sternotomy, CABG and valvular replacement similar to the prior study. Medial right hemidiaphragm and left hemidiaphragm are obscured. Increased interstitial markings persist  along with small bilateral pleural effusions. No acute bone process. IMPRESSION: Persistent findings of CHF with small bilateral pleural effusions and pulmonary edema. Basilar airspace disease left greater than right likely atelectasis. Difficult to exclude developing infection at the left lung base. Electronically Signed   By: Donzetta Kohut M.D.   On: 02/07/2020 11:13   DG Chest Port 1 View  Result Date: 02/05/2020 CLINICAL DATA:  Shortness of breath. EXAM: PORTABLE CHEST 1 VIEW COMPARISON:  01/01/2020. FINDINGS: Prior CABG and cardiac valve replacement. Stable cardiomegaly. Diffuse bilateral pulmonary interstitial prominence again noted. Small bilateral pleural effusions noted. No pneumothorax. IMPRESSION: Prior CABG and cardiac valve replacement.  Stable cardiomegaly. 2. Diffuse bilateral from interstitial prominence again noted suggesting interstitial edema. Small bilateral pleural effusions. Electronically Signed   By: Maisie Fus  Register   On: 02/05/2020 07:09    Catarina Hartshorn, DO  [facility]  If 7PM-7AM, please contact night-coverage [email or web address] Password [email or web address] 02/08/2020, 9:03 AM   LOS: 3 days

## 2020-02-08 NOTE — Care Management Important Message (Signed)
Important Message  Patient Details  Name: [name] MRN: [id number] Date of Birth: Jan 05, 1926   Medicare Important Message Given:  Yes     Corey Harold 02/08/2020, 12:08 PM

## 2020-02-08 NOTE — Progress Notes (Signed)
Physical Therapy Treatment Patient Details Name: [name] MRN: [id number] DOB: [birth year] Today's Date: 02/08/2020    History of Present Illness [name] is a 86 y.o. female with past medical history significant for chronic combined systolic and diastolic heart failure, COPD, hypertension, mechanical valve replacement (chronically on Coumadin), hypercholesterolemia, anxiety, chronic respiratory failure using 4 L nasal cannula at baseline and vitamin D deficiency; who presented to the hospital secondary to worsening shortness of breath.  Patient reports that for the last 3-4 days she had noticed increase in her shortness of breath initially with exertion and then also at rest; patient expressed some orthopnea and increased swelling in her lower extremity as well.  Very little variation on her weight and expressed good medication compliance.  Patient denies fever, chills, chest pain, nausea, vomiting, abdominal pain, dysuria, hematuria, melena, hematochezia, headaches, blurred vision or focal weakness. Of note, patient has a prior history of positive Covid infection in December 2020.  She expressed full recovery from that.    PT Comments    Patient eager to participate with physical therapy today. She transitions to seated EOB without assist and demonstrates good sitting balance and tolerance. She completes seated exercises and is educated on continuing to complete them while in hospital and upon discharge home. She transfers to standing with use of RW and requires guard for safety and balance. She ambulates without loss of balance and no SOB while on supplemental O2. Patient and family are educated on continuing to have patient live an active lifestyle. Patient left in bed with family present. Patient will benefit from continued physical therapy in hospital and recommended venue below to increase strength, balance, endurance for safe ADLs and gait.   Follow Up Recommendations  Home health  PT;Supervision for mobility/OOB;Supervision/Assistance - 24 hour     Equipment Recommendations  None recommended by PT    Recommendations for Other Services       Precautions / Restrictions Precautions Precautions: Fall Restrictions Weight Bearing Restrictions: No    Mobility  Bed Mobility Overal bed mobility: Needs Assistance Bed Mobility: Supine to Sit;Sit to Supine     Supine to sit: Supervision;HOB elevated Sit to supine: Supervision;HOB elevated   General bed mobility comments: slightly slow, labored  Transfers Overall transfer level: Needs assistance Equipment used: None Transfers: Sit to/from [employer] Sit to Stand: Min guard Stand pivot transfers: Min guard       General transfer comment: using RW  Ambulation/Gait Ambulation/Gait assistance: Min guard Gait Distance (Feet): 115 Feet Assistive device: Rolling walker (2 wheeled) Gait Pattern/deviations: Decreased step length - left;Decreased step length - right;Step-through pattern;Decreased stride length;Trunk flexed Gait velocity: decreased   General Gait Details: slow, minimally labored cadence with use of RW   Stairs             Wheelchair Mobility    Modified Rankin (Stroke Patients Only)       Balance Overall balance assessment: Needs assistance Sitting-balance support: Feet supported;No upper extremity supported Sitting balance-Leahy Scale: Good Sitting balance - Comments: seated EOB   Standing balance support: No upper extremity supported;During functional activity Standing balance-Leahy Scale: Good Standing balance comment: fair/good with RW                            Cognition Arousal/Alertness: Awake/alert Behavior During Therapy: WFL for tasks assessed/performed Overall Cognitive Status: Within Functional Limits for tasks assessed  Exercises General Exercises - Lower Extremity Long Arc  Quad: AROM;Both;10 reps;Seated Hip Flexion/Marching: AROM;Both;10 reps;Seated Toe Raises: AROM;Both;10 reps;Seated Heel Raises: AROM;Both;10 reps;Seated    General Comments        Pertinent Vitals/Pain Pain Assessment: No/denies pain    Home Living                      Prior Function            PT Goals (current goals can now be found in the care plan section) Acute Rehab PT Goals Patient Stated Goal: return home PT Goal Formulation: With patient Time For Goal Achievement: 02/13/20 Potential to Achieve Goals: Good Progress towards PT goals: Progressing toward goals    Frequency    Min 3X/week      PT Plan Current plan remains appropriate    Co-evaluation              AM-PAC PT "6 Clicks" Mobility   Outcome Measure  Help needed turning from your back to your side while in a flat bed without using bedrails?: None Help needed moving from lying on your back to sitting on the side of a flat bed without using bedrails?: None Help needed moving to and from a bed to a chair (including a wheelchair)?: A Little Help needed standing up from a chair using your arms (e.g., wheelchair or bedside chair)?: A Little Help needed to walk in hospital room?: A Little Help needed climbing 3-5 steps with a railing? : A Lot 6 Click Score: 19    End of Session Equipment Utilized During Treatment: Gait belt;Oxygen Activity Tolerance: Patient tolerated treatment well;Patient limited by fatigue Patient left: in bed;with call bell/phone within reach;with nursing/sitter in room Nurse Communication: Mobility status PT Visit Diagnosis: Unsteadiness on feet (R26.81);Other abnormalities of gait and mobility (R26.89);Muscle weakness (generalized) (M62.81)     Time: 8325-4982 PT Time Calculation (min) (ACUTE ONLY): 23 min  Charges:  $Therapeutic Exercise: 8-22 mins $Therapeutic Activity: 8-22 mins                     11:56 AM, 02/08/20 Mearl Latin PT,  DPT Physical Therapist at [facility]

## 2020-02-09 ENCOUNTER — Other Ambulatory Visit: Payer: Self-pay | Admitting: Orthopaedic Surgery

## 2020-02-09 DIAGNOSIS — J9622 Acute and chronic respiratory failure with hypercapnia: Secondary | ICD-10-CM

## 2020-02-09 DIAGNOSIS — J9621 Acute and chronic respiratory failure with hypoxia: Secondary | ICD-10-CM

## 2020-02-09 LAB — CBC
HCT: 32.3 % — ABNORMAL LOW (ref 36.0–46.0)
Hemoglobin: 9.8 g/dL — ABNORMAL LOW (ref 12.0–15.0)
MCH: 29.3 pg (ref 26.0–34.0)
MCHC: 30.3 g/dL (ref 30.0–36.0)
MCV: 96.4 fL (ref 80.0–100.0)
Platelets: 213 10*3/uL (ref 150–400)
RBC: 3.35 MIL/uL — ABNORMAL LOW (ref 3.87–5.11)
RDW: 13.1 % (ref 11.5–15.5)
WBC: 7.8 10*3/uL (ref 4.0–10.5)
nRBC: 0 % (ref 0.0–0.2)

## 2020-02-09 LAB — BASIC METABOLIC PANEL
Anion gap: 11 (ref 5–15)
BUN: 26 mg/dL — ABNORMAL HIGH (ref 8–23)
CO2: 35 mmol/L — ABNORMAL HIGH (ref 22–32)
Calcium: 8.8 mg/dL — ABNORMAL LOW (ref 8.9–10.3)
Chloride: 97 mmol/L — ABNORMAL LOW (ref 98–111)
Creatinine, Ser: 1.1 mg/dL — ABNORMAL HIGH (ref 0.44–1.00)
GFR calc Af Amer: 51 mL/min — ABNORMAL LOW (ref >60)
GFR calc non Af Amer: 44 mL/min — ABNORMAL LOW (ref >60)
Glucose, Bld: 91 mg/dL (ref 70–99)
Potassium: 3.6 mmol/L (ref 3.5–5.1)
Sodium: 143 mmol/L (ref 135–145)

## 2020-02-09 LAB — MAGNESIUM: Magnesium: 2.4 mg/dL (ref 1.7–2.4)

## 2020-02-09 LAB — BRAIN NATRIURETIC PEPTIDE: B Natriuretic Peptide: 386 pg/mL — ABNORMAL HIGH (ref 0.0–100.0)

## 2020-02-09 LAB — PROTIME-INR
INR: 2.3 — ABNORMAL HIGH (ref 0.8–1.2)
Prothrombin Time: 25 seconds — ABNORMAL HIGH (ref 11.4–15.2)

## 2020-02-09 MED ORDER — WARFARIN SODIUM 5 MG PO TABS: 5.0000 mg | ORAL_TABLET | Freq: Once | ORAL | Status: DC

## 2020-02-09 MED ORDER — FUROSEMIDE 40 MG PO TABS: 40.0000 mg | ORAL_TABLET | Freq: Every day | ORAL | 1 refills | Status: DC

## 2020-02-09 NOTE — Progress Notes (Signed)
ANTICOAGULATION CONSULT NOTE -   Pharmacy Consult for warfarin Indication: Mechanical aortic valve replacement   Patient Measurements: Height: 5\' 2"  (157.5 cm) Weight: 106 lb 11.2 oz (48.4 kg) IBW/kg (Calculated) : 50.1   Vital Signs: Temp: 97.8 F (36.6 C) (03/27 0443) Temp Source: Oral (03/27 0443) BP: 130/45 (03/27 0443) Pulse Rate: 68 (03/27 0443)  Labs: Recent Labs    02/07/20 0533 02/07/20 1234 02/07/20 1234 02/08/20 0523 02/09/20 0632  HGB  --  10.7*   < > 9.6* 9.8*  HCT  --  35.3*  --  30.8* 32.3*  PLT  --  244  --  216 213  LABPROT 29.4*  --   --  28.3* 25.0*  INR 2.8*  --   --  2.7* 2.3*  CREATININE 1.09*  --   --  1.02* 1.10*   < > = values in this interval not displayed.    Estimated Creatinine Clearance: 25.5 mL/min (A) (by C-G formula based on SCr of 1.1 mg/dL (H)).   Assessment: Pharmacy consulted to dose warfarin in patient with mechanical aortic valve replacement.  Patient's home dose listed as 5 mg Tue-Sat and 2.5 mg ROW.  INR on admission    02/09/20 INR: 2.3  Goal of Therapy:  INR goal 2.2-3.0 per Anticoag clinic Monitor platelets by anticoagulation protocol: Yes   Plan:  Give home dose of warfarin  5 mg x 1 dose today  Monitor daily INR and s/s of bleeding.  02/11/20, Pharm. D. Clinical Pharmacist 02/09/2020 9:30 AM

## 2020-02-11 ENCOUNTER — Other Ambulatory Visit: Payer: Self-pay | Admitting: *Deleted

## 2020-02-11 ENCOUNTER — Telehealth: Payer: Self-pay | Admitting: Family Medicine

## 2020-02-11 DIAGNOSIS — H3530 Unspecified macular degeneration: Secondary | ICD-10-CM | POA: Diagnosis not present

## 2020-02-11 DIAGNOSIS — F419 Anxiety disorder, unspecified: Secondary | ICD-10-CM | POA: Diagnosis not present

## 2020-02-11 DIAGNOSIS — M170 Bilateral primary osteoarthritis of knee: Secondary | ICD-10-CM | POA: Diagnosis not present

## 2020-02-11 DIAGNOSIS — I447 Left bundle-branch block, unspecified: Secondary | ICD-10-CM | POA: Diagnosis not present

## 2020-02-11 DIAGNOSIS — I2510 Atherosclerotic heart disease of native coronary artery without angina pectoris: Secondary | ICD-10-CM | POA: Diagnosis not present

## 2020-02-11 DIAGNOSIS — E785 Hyperlipidemia, unspecified: Secondary | ICD-10-CM | POA: Diagnosis not present

## 2020-02-11 DIAGNOSIS — Z7901 Long term (current) use of anticoagulants: Secondary | ICD-10-CM | POA: Diagnosis not present

## 2020-02-11 DIAGNOSIS — M19071 Primary osteoarthritis, right ankle and foot: Secondary | ICD-10-CM | POA: Diagnosis not present

## 2020-02-11 DIAGNOSIS — I0981 Rheumatic heart failure: Secondary | ICD-10-CM | POA: Diagnosis not present

## 2020-02-11 DIAGNOSIS — I051 Rheumatic mitral insufficiency: Secondary | ICD-10-CM | POA: Diagnosis not present

## 2020-02-11 DIAGNOSIS — G609 Hereditary and idiopathic neuropathy, unspecified: Secondary | ICD-10-CM | POA: Diagnosis not present

## 2020-02-11 DIAGNOSIS — Z9981 Dependence on supplemental oxygen: Secondary | ICD-10-CM | POA: Diagnosis not present

## 2020-02-11 DIAGNOSIS — M19072 Primary osteoarthritis, left ankle and foot: Secondary | ICD-10-CM | POA: Diagnosis not present

## 2020-02-11 DIAGNOSIS — J441 Chronic obstructive pulmonary disease with (acute) exacerbation: Secondary | ICD-10-CM | POA: Diagnosis not present

## 2020-02-11 DIAGNOSIS — I130 Hypertensive heart and chronic kidney disease with heart failure and stage 1 through stage 4 chronic kidney disease, or unspecified chronic kidney disease: Secondary | ICD-10-CM | POA: Diagnosis not present

## 2020-02-11 DIAGNOSIS — I5043 Acute on chronic combined systolic (congestive) and diastolic (congestive) heart failure: Secondary | ICD-10-CM | POA: Diagnosis not present

## 2020-02-11 DIAGNOSIS — I248 Other forms of acute ischemic heart disease: Secondary | ICD-10-CM | POA: Diagnosis not present

## 2020-02-11 DIAGNOSIS — E7800 Pure hypercholesterolemia, unspecified: Secondary | ICD-10-CM | POA: Diagnosis not present

## 2020-02-11 DIAGNOSIS — F0390 Unspecified dementia without behavioral disturbance: Secondary | ICD-10-CM | POA: Diagnosis not present

## 2020-02-11 DIAGNOSIS — I6529 Occlusion and stenosis of unspecified carotid artery: Secondary | ICD-10-CM | POA: Diagnosis not present

## 2020-02-11 DIAGNOSIS — J9621 Acute and chronic respiratory failure with hypoxia: Secondary | ICD-10-CM | POA: Diagnosis not present

## 2020-02-11 DIAGNOSIS — N1832 Chronic kidney disease, stage 3b: Secondary | ICD-10-CM | POA: Diagnosis not present

## 2020-02-11 DIAGNOSIS — J9622 Acute and chronic respiratory failure with hypercapnia: Secondary | ICD-10-CM | POA: Diagnosis not present

## 2020-02-11 DIAGNOSIS — D631 Anemia in chronic kidney disease: Secondary | ICD-10-CM | POA: Diagnosis not present

## 2020-02-11 DIAGNOSIS — E559 Vitamin D deficiency, unspecified: Secondary | ICD-10-CM | POA: Diagnosis not present

## 2020-02-11 NOTE — Patient Outreach (Signed)
Incoming call from pt's daughter, Lisbeth Ply, to discuss in home care plan. She left message to return her call.  Called Mrs. Montez Morita and left a message for her to return my call.  Zara Council. Burgess Estelle, MSN, GNP-BC Gerontological Nurse Practitioner [facility] Care Management [phone]  Lisbeth Ply returned my call and we discussed pt current plan of care. She is being referred to [facility] Palliative Care services. She will have home health for the next few weeks. [name] is concerned about pt's need for protime lab draws and hopes that these can be done at home.  Advised that home health will be able to do these while they are providing services. Not sure about palliative care. I will call and ask them to communicate if they will be able to do this under their care.  Messaged [name] that I have communicated this need.  I will follow up in one week.  Zara Council. Burgess Estelle, MSN, [facility] Gerontological Nurse Practitioner [facility] Care Management [phone]

## 2020-02-11 NOTE — Telephone Encounter (Signed)
[name] is calling from [facility] requesting physical therapy orders for PT 1x a week for 9 weeks after a recent hospitalization. Can be reached at [phone]

## 2020-02-11 NOTE — Telephone Encounter (Signed)
Please advise 

## 2020-02-11 NOTE — Telephone Encounter (Signed)
Ok for [facility] -PT

## 2020-02-11 NOTE — Telephone Encounter (Signed)
[name] was informed of the Home Health order

## 2020-02-12 ENCOUNTER — Telehealth: Payer: Self-pay | Admitting: Family Medicine

## 2020-02-12 ENCOUNTER — Other Ambulatory Visit: Payer: Self-pay | Admitting: Family Medicine

## 2020-02-12 DIAGNOSIS — I5043 Acute on chronic combined systolic (congestive) and diastolic (congestive) heart failure: Secondary | ICD-10-CM | POA: Diagnosis not present

## 2020-02-12 DIAGNOSIS — D631 Anemia in chronic kidney disease: Secondary | ICD-10-CM | POA: Diagnosis not present

## 2020-02-12 DIAGNOSIS — I130 Hypertensive heart and chronic kidney disease with heart failure and stage 1 through stage 4 chronic kidney disease, or unspecified chronic kidney disease: Secondary | ICD-10-CM | POA: Diagnosis not present

## 2020-02-12 DIAGNOSIS — I0981 Rheumatic heart failure: Secondary | ICD-10-CM | POA: Diagnosis not present

## 2020-02-12 DIAGNOSIS — J441 Chronic obstructive pulmonary disease with (acute) exacerbation: Secondary | ICD-10-CM | POA: Diagnosis not present

## 2020-02-12 DIAGNOSIS — N1832 Chronic kidney disease, stage 3b: Secondary | ICD-10-CM | POA: Diagnosis not present

## 2020-02-12 NOTE — Telephone Encounter (Signed)
yes

## 2020-02-12 NOTE — Telephone Encounter (Signed)
Please advise 

## 2020-02-12 NOTE — Telephone Encounter (Signed)
[name] has been informed that it was ok by Dr Nash Mantis

## 2020-02-12 NOTE — Telephone Encounter (Signed)
Angelique Blonder is calling from [facility] and states they received a referral from [facility] for palliative care. She is calling and would like to know if Dr. Judee Clara is in agreement.   CB# [phone] opt 2

## 2020-02-13 ENCOUNTER — Telehealth: Payer: Self-pay

## 2020-02-13 NOTE — Telephone Encounter (Signed)
Telephone call to patient to schedule palliative care visit with patient. Patient/family in agreement with home visit on 02-14-20 at 12:30PM.

## 2020-02-14 ENCOUNTER — Other Ambulatory Visit: Payer: Self-pay

## 2020-02-14 ENCOUNTER — Other Ambulatory Visit: Payer: Medicare Other

## 2020-02-14 ENCOUNTER — Telehealth: Payer: Self-pay | Admitting: Cardiology

## 2020-02-14 DIAGNOSIS — Z515 Encounter for palliative care: Secondary | ICD-10-CM

## 2020-02-14 NOTE — Progress Notes (Signed)
COMMUNITY PALLIATIVE CARE SW NOTE  PATIENT NAME: [name] DOB: 1926-06-10 MRN: [id number]  PRIMARY CARE PROVIDER: Maryruth Hancock, MD  RESPONSIBLE PARTY:  Acct ID - Guarantor Home Phone Work Phone Relationship Acct Type  [id number] - [name],[name]  Self P/F     [name], [place],  [address]     PLAN OF CARE and INTERVENTIONS:             1. GOALS OF CARE/ ADVANCE CARE PLANNING:  Patient is a DNR, form is in the home. Goal is to stay at home, and avoid hospitalizations.  2. SOCIAL/EMOTIONAL/SPIRITUAL ASSESSMENT/ INTERVENTIONS:  SW and RN met with patient, [name] (patient's daughter), and [name] (patient's caregiver) in the home. [name] provided brief history. Discussed hospitalization and "scare" that patient had. Patient is eating well. Patient is sleeping well, now has a hospital bed and is enjoying it. Patient is very independent and motivated to keep doing as she is. [name] is interested in doing INR checks at home, SW requested order from cardiologist and they will coordinate with patient/family. Patient discussed her life. Patient is a widower. 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 Patient has two adult daughters - [name] that lives in [place] and [place] in [place], [place]. Patient has grandchildren and great grandchildren. Patient was appreciative of visit. SW provided education on palliative care, discussed goals and used active and reflective listening.  3. PATIENT/CAREGIVER EDUCATION/ COPING:  Patient is alert, oriented. Patient is hard of hearing. Patient is open with her feelings. Patient is pleasant. Denies coping concerns. Patient enjoys reading and doing puzzles. Family is very supportive.  4. PERSONAL EMERGENCY PLAN:  Family/caregiver will call 9-1-1 for emergencies. 5. COMMUNITY RESOURCES COORDINATION/ HEALTH CARE NAVIGATION:  [name] manages care. [name] lives with patient and provides 24-hour care. Patient has  Nurse, learning disability for nursing and PT. Patient is scheduled for second COVID vaccine on 4/14. Cardiology follow-up on 4/19.  6. FINANCIAL/LEGAL CONCERNS/INTERVENTIONS:  None.     SOCIAL HX:  Social History   Tobacco Use  . Smoking status: Former Smoker    Packs/day: 0.50    Types: Cigarettes    Quit date: 11/30/1991    Years since quitting: 28.2  . Smokeless tobacco: Never Used  . Tobacco comment: Tobacco use-no  Substance Use Topics  . Alcohol use: No    CODE STATUS: DNR ADVANCED DIRECTIVES: N MOST FORM COMPLETE:  No. HOSPICE EDUCATION PROVIDED: Yes.  PPS: Patient is mostly dependent of ADLs. Standby for safety.  I spent40mnutes with patient/family, from12:15-1:00pproviding education, support and consultation.   WMargaretmary Lombard LCSW

## 2020-02-14 NOTE — Telephone Encounter (Signed)
Talked to [facility] - we will enter order for patient home INR machine. Advised this process can take few weeks to complete . [facility] for Daughter - [name] to call clinic back  additional information).  [name] currently has [facility] services with [place]. Will call [name] to provide order to repeat home INR on 02/19/2020.

## 2020-02-14 NOTE — Telephone Encounter (Signed)
Daughter of the pt calling to speak with a pharmacist. Will route to the pharmd pool

## 2020-02-14 NOTE — Telephone Encounter (Signed)
New Message:      Whitney from [employer] wants to know if Dr Jens Som would give an an order so pt can have a pump so pt's family can do her INR at home please.p t

## 2020-02-14 NOTE — Telephone Encounter (Signed)
Follow up     Jenna from [place] is returning call    Please call back

## 2020-02-14 NOTE — Progress Notes (Signed)
PATIENT NAME: [name] DOB: 22-Jan-1926 MRN: [id number]  PRIMARY CARE PROVIDER: Maryruth Hancock, MD  RESPONSIBLE PARTY:  Acct ID - Guarantor Home Phone Work Phone Relationship Acct Type  [id number] [name]  Self P/F     [name], [place], [place] [address]    PLAN OF CARE and INTERVENTIONS:               1.  GOALS OF CARE/ ADVANCE CARE PLANNING:  Remain at home with caregiver [name] and daughter assisting patient with care.                2.  PATIENT/CAREGIVER EDUCATION:  Education n palliative care services, education on fall precautions, education on s/s of infection, reviewed meds, support               4. PERSONAL EMERGENCY PLAN:  Patient has a 24/7 caregiver in the home.               5.  DISEASE STATUS:SW and RN made scheduled palliative care home visit. Palliative care team met with patient, patients daughter [name] and caregiver who is a family member [name]. Patient return to her home recently after a hospitalization.Patients past medical history includes but not limited to CHF, HTN, CAD, artery bypass graft, Carotid artery stenosis, LBBB, Aortic valve disorder, COPD, CKD stage III and hyperlipidemia.      Patient alert and oriented and denies having any pain at the present time. Patient was hospitalized due to CHF. Patients Lasix was increased from 20 mg to 40 mg and patient reports she has been doing better with increase in lasix. Daughter to follow up with Cardiology to see if patient needs to begin taking potassium. Patients cardiologist is Dr. Stanford Breed. Palliative care team reached out to doctor Crenshaw's office to see if PT and INR machine could be obtained for Home. Daughter states it is difficult to get patient to the Coumadin Clinic to get coumadin level checked. Daughter in agreement with learning to use PT and INR machine and palliative care team states that Home Health referral likely could be made for education on how to use PT and INR machine. Patient  receiving O2 via nasal cannula at 4 liters per minute. Patient has shortness of breath on exertion and O2 SATs currently 94% on 4 liters of O2.  Patient receiving therapy through [facility]. Patient report she is eating good and states she does avoid green leafy vegetables to keep Coumadin in range. Patient reports she sleeps well at night and recently obtained hospital bed and is open to using bed.  Patient denies having any cough. Patient has no edema in her lower extremities today. Patient's current weight is 107 lbs and patient's height is 5 2 and 1/2. Nurse reviewed patient's medications with daughter. Patients goal is to remain at home. Patient states she enjoys going out sides for short periods when she is able. Palliative care services explained and patient and family in agreement. Patient's current Coumadin dose is 74m on Tuesday and Saturday and 2.5 mg on other days. Patient has received her first covid-19 vaccination and is scheduled to receive second dose for 02/27/20.  Patient, daughter and caregiver encouraged to contact palliative care with questions or concerns.      HISTORY OF PRESENT ILLNESS: Patient is a 86year old patient who resides in her home.  Patient has 24/7 caregiver in home.  Patient agreeable to palliative care services.  Patient will be seen monthly and  PRN.     CODE STATUS: DNR  ADVANCED DIRECTIVES: No  MOST FORM: No PPS: 50%   PHYSICAL EXAM:   VITALS: Today's Vitals   02/14/20 1230  Resp: 16  Weight: 107 lb (48.5 kg)  Height: 5' 2"  (1.575 m)  PainSc: 0-No pain    LUNGS: clear to auscultation  CARDIAC: Cor RRR  EXTREMITIES: none edema SKIN: bruising on hands and arms from lab drawns and IV sticks  NEURO: positive for gait problems and weakness       [name], RN

## 2020-02-14 NOTE — Telephone Encounter (Signed)
LM at [facility] - coordinator will call back to get orders for INR

## 2020-02-14 NOTE — Telephone Encounter (Signed)
Order given to [facility]. Next INR on 02/18/2020 will call results to [facility] @ [phone].

## 2020-02-14 NOTE — Telephone Encounter (Signed)
All details discussed with daughter.

## 2020-02-19 ENCOUNTER — Encounter: Payer: Self-pay | Admitting: *Deleted

## 2020-02-19 ENCOUNTER — Ambulatory Visit (INDEPENDENT_AMBULATORY_CARE_PROVIDER_SITE_OTHER): Payer: Medicare Other | Admitting: Pharmacist Clinician (PhC)/ Clinical Pharmacy Specialist

## 2020-02-19 ENCOUNTER — Other Ambulatory Visit: Payer: Self-pay | Admitting: *Deleted

## 2020-02-19 DIAGNOSIS — Z952 Presence of prosthetic heart valve: Secondary | ICD-10-CM | POA: Diagnosis not present

## 2020-02-19 DIAGNOSIS — I0981 Rheumatic heart failure: Secondary | ICD-10-CM | POA: Diagnosis not present

## 2020-02-19 DIAGNOSIS — Z5181 Encounter for therapeutic drug level monitoring: Secondary | ICD-10-CM | POA: Diagnosis not present

## 2020-02-19 DIAGNOSIS — H353230 Exudative age-related macular degeneration, bilateral, stage unspecified: Secondary | ICD-10-CM | POA: Diagnosis not present

## 2020-02-19 DIAGNOSIS — I359 Nonrheumatic aortic valve disorder, unspecified: Secondary | ICD-10-CM

## 2020-02-19 DIAGNOSIS — I5043 Acute on chronic combined systolic (congestive) and diastolic (congestive) heart failure: Secondary | ICD-10-CM | POA: Diagnosis not present

## 2020-02-19 DIAGNOSIS — J441 Chronic obstructive pulmonary disease with (acute) exacerbation: Secondary | ICD-10-CM | POA: Diagnosis not present

## 2020-02-19 DIAGNOSIS — N1832 Chronic kidney disease, stage 3b: Secondary | ICD-10-CM | POA: Diagnosis not present

## 2020-02-19 DIAGNOSIS — Z961 Presence of intraocular lens: Secondary | ICD-10-CM | POA: Diagnosis not present

## 2020-02-19 DIAGNOSIS — I6350 Cerebral infarction due to unspecified occlusion or stenosis of unspecified cerebral artery: Secondary | ICD-10-CM | POA: Diagnosis not present

## 2020-02-19 DIAGNOSIS — I130 Hypertensive heart and chronic kidney disease with heart failure and stage 1 through stage 4 chronic kidney disease, or unspecified chronic kidney disease: Secondary | ICD-10-CM | POA: Diagnosis not present

## 2020-02-19 DIAGNOSIS — D631 Anemia in chronic kidney disease: Secondary | ICD-10-CM | POA: Diagnosis not present

## 2020-02-19 LAB — POCT INR: INR: 1.5 — AB (ref 2.0–3.0)

## 2020-02-19 NOTE — Patient Outreach (Signed)
Telephone outreach for case closure, HTA pt.  Talked with Lisbeth Ply, pt's daughter. She advised Palliative Care is fully engaged with her mother now. They are also still receiving home health. The cardiologist has ordered a home PT/INR monitor for them to use so pt does not have to go to the office. She is very pleased with how everything her mother needs has been provided.  She voices her appreciation for our services and understands the transition from care management to palliative care.  Case closed.  Zara Council. Burgess Estelle, MSN, [facility] Gerontological Nurse Practitioner [facility] Care Management [phone]

## 2020-02-26 ENCOUNTER — Ambulatory Visit (INDEPENDENT_AMBULATORY_CARE_PROVIDER_SITE_OTHER): Payer: Medicare Other | Admitting: Pharmacist Clinician (PhC)/ Clinical Pharmacy Specialist

## 2020-02-26 DIAGNOSIS — I359 Nonrheumatic aortic valve disorder, unspecified: Secondary | ICD-10-CM

## 2020-02-26 DIAGNOSIS — I6350 Cerebral infarction due to unspecified occlusion or stenosis of unspecified cerebral artery: Secondary | ICD-10-CM | POA: Diagnosis not present

## 2020-02-26 DIAGNOSIS — I0981 Rheumatic heart failure: Secondary | ICD-10-CM | POA: Diagnosis not present

## 2020-02-26 DIAGNOSIS — N1832 Chronic kidney disease, stage 3b: Secondary | ICD-10-CM | POA: Diagnosis not present

## 2020-02-26 DIAGNOSIS — Z5181 Encounter for therapeutic drug level monitoring: Secondary | ICD-10-CM | POA: Diagnosis not present

## 2020-02-26 DIAGNOSIS — I130 Hypertensive heart and chronic kidney disease with heart failure and stage 1 through stage 4 chronic kidney disease, or unspecified chronic kidney disease: Secondary | ICD-10-CM | POA: Diagnosis not present

## 2020-02-26 DIAGNOSIS — Z952 Presence of prosthetic heart valve: Secondary | ICD-10-CM

## 2020-02-26 DIAGNOSIS — I5043 Acute on chronic combined systolic (congestive) and diastolic (congestive) heart failure: Secondary | ICD-10-CM | POA: Diagnosis not present

## 2020-02-26 DIAGNOSIS — J441 Chronic obstructive pulmonary disease with (acute) exacerbation: Secondary | ICD-10-CM | POA: Diagnosis not present

## 2020-02-26 DIAGNOSIS — D631 Anemia in chronic kidney disease: Secondary | ICD-10-CM | POA: Diagnosis not present

## 2020-02-26 LAB — POCT INR: INR: 1.9 — AB (ref 2.0–3.0)

## 2020-02-27 ENCOUNTER — Ambulatory Visit: Payer: Medicare Other | Attending: Internal Medicine

## 2020-02-27 DIAGNOSIS — Z23 Encounter for immunization: Secondary | ICD-10-CM

## 2020-02-27 NOTE — Progress Notes (Signed)
HPI: FU coronary artery disease (s/p CABG), prior aortic valve replacement and aortic insufficiency. Last cardiac catheterization in 2011 revealed normal left main; 30% first diagonal, 50% LAD and 60% second diagonal. There was a 40-50% circumflex and a 40% right coronary artery. All grafts were occluded. Medical therapy recommended. Transesophageal echocardiogram in February of 2012 showed an ejection fraction of 45%. There was a St. Jude aortic valve with moderate perivalvular aortic insufficiency. There was moderate mitral regurgitation, moderate left atrial enlargement, mild right atrial enlargement and mild tricuspid regurgitation.Last echoMarch 2020 showed ejection fraction 25 to 30%, mild left ventricular enlargement, biatrial enlargement, moderate mitral regurgitation, moderate aortic insufficiency status post aortic valve replacement with mean gradient 15 mmHg. Carotid dopplers August 2020 showed 40 to 59% right and 1 to 39% left stenosis.  Had Covid 12/20. Admitted 3/21 with CHF and improved with diuresis. Since she was last seen,she denies dyspnea, chest pain, palpitations or syncope.  Current Outpatient Medications  Medication Sig Dispense Refill  . acetaminophen (TYLENOL) 650 MG CR tablet Take 650 mg by mouth every 8 (eight) hours as needed for pain.    Marland Kitchen ALPRAZolam (XANAX) 0.25 MG tablet Take 0.125-0.25 mg by mouth at bedtime as needed for anxiety.     . Ascorbic Acid (VITAMIN C) 500 MG CAPS Take 500 mg by mouth daily.    Marland Kitchen atorvastatin (LIPITOR) 40 MG tablet TAKE ONE TABLET BY MOUTH ONCE DAILY. (Patient taking differently: Take 40 mg by mouth daily. ) 90 tablet 1  . baclofen (LIORESAL) 10 MG tablet Take 10 mg by mouth as needed for muscle spasms.    . carvedilol (COREG) 6.25 MG tablet Take 6.25 mg by mouth 2 (two) times daily with a meal.    . docusate sodium (COLACE) 100 MG capsule Take 100 mg by mouth at bedtime.     . donepezil (ARICEPT) 10 MG tablet Take 1 tablet (10 mg  total) by mouth daily. 30 tablet 1  . ergocalciferol (VITAMIN D2) 50000 UNITS capsule Take 50,000 Units by mouth every Tuesday.     . ferrous sulfate 325 (65 FE) MG EC tablet Take 325 mg by mouth daily.     . furosemide (LASIX) 40 MG tablet Take 1 tablet (40 mg total) by mouth daily. 30 tablet 1  . HYDROcodone-acetaminophen (NORCO) 7.5-325 MG tablet Take 1 tablet by mouth every 6 (six) hours as needed for moderate pain.    . hydrocortisone (ANUSOL-HC) 2.5 % rectal cream Place 1 application rectally 3 (three) times daily as needed for hemorrhoids or anal itching.    . levalbuterol (XOPENEX) 0.63 MG/3ML nebulizer solution Take 3 mLs (0.63 mg total) by nebulization every 4 (four) hours as needed for wheezing or shortness of breath. 120 mL 2  . meclizine (ANTIVERT) 25 MG tablet Take 25 mg by mouth 3 (three) times daily as needed for dizziness.    . Melatonin 3 MG TABS Take 1 tablet by mouth at bedtime.    . methylPREDNISolone (MEDROL) 4 MG tablet Take 4 mg by mouth 2 (two) times daily as needed.    . multivitamin-lutein (OCUVITE-LUTEIN) CAPS capsule Take 1 capsule by mouth daily.    . ondansetron (ZOFRAN) 4 MG tablet Take 4 mg by mouth every 8 (eight) hours as needed for nausea or vomiting.    . polyethylene glycol (MIRALAX / GLYCOLAX) 17 g packet Take 17 g by mouth daily as needed for moderate constipation.    . pregabalin (LYRICA) 75 MG capsule Take  1 capsule (75 mg total) by mouth every other day. 15 capsule 5  . spironolactone (ALDACTONE) 25 MG tablet TAKE (1/2) TABLET BY MOUTH DAILY. (Patient taking differently: Take 12.5 mg by mouth daily. ) 45 tablet 6  . traMADol (ULTRAM) 50 MG tablet Take 50 mg by mouth every 6 (six) hours as needed for moderate pain.    . traZODone (DESYREL) 50 MG tablet Take 0.5-1 tablets (25-50 mg total) by mouth at bedtime as needed for sleep. 30 tablet 3  . umeclidinium-vilanterol (ANORO ELLIPTA) 62.5-25 MCG/INH AEPB Inhale 1 puff into the lungs daily.     Marland Kitchen warfarin  (COUMADIN) 5 MG tablet TAKE 1/2 TO 1 TABLET ONCE DAILY AS DIRECTED BY COUMADIN CLINIC. (Patient taking differently: Take 2.5-5 mg by mouth daily. Patient takes 5mg  on Tuesdays and Saturdays; on all other days, patient takes 2.5mg ) 45 tablet 0  . zinc sulfate 50 MG CAPS capsule Take 50 mg by mouth 2 (two) times daily.      No current facility-administered medications for this visit.     Past Medical History:  Diagnosis Date  . Anemia   . Anxiety   . Arthritis   . Blood transfusion without reported diagnosis   . Carotid artery stenosis    Without infarction  . Cataract   . Chronic combined systolic (congestive) and diastolic (congestive) heart failure (HCC)   . COPD (chronic obstructive pulmonary disease) (HCC)   . Coronary artery disease   . Heart valve replaced by other means   . Hypercholesterolemia    Pure  . Hypertension    Unspecified  . LBBB (left bundle branch block)   . Macular degeneration (senile) of retina, unspecified   . On home O2   . Postsurgical aortocoronary bypass status   . Stroke (HCC)   . Transient global amnesia   . Unspecified hereditary and idiopathic peripheral neuropathy   . Unspecified vitamin D deficiency     Past Surgical History:  Procedure Laterality Date  . AORTIC VALVE REPLACEMENT    . APPENDECTOMY    . CORONARY ARTERY BYPASS GRAFT    . HIP PINNING,CANNULATED Right 07/18/2018   Procedure: RIGHT CANNULATED HIP PINNING;  Surgeon: 09/17/2018, MD;  Location: [facility] OR;  Service: Orthopedics;  Laterality: Right;  . TONSILLECTOMY      Social History   Socioeconomic History  . Marital status: Widowed    Spouse name: Not on file  . Number of children: 2  . Years of education: 12th  . Highest education level: Not on file  Occupational History    Employer: RETIRED  Tobacco Use  . Smoking status: Former Smoker    Packs/day: 0.50    Types: Cigarettes    Quit date: 11/30/1991    Years since quitting: 28.2  . Smokeless tobacco: Never Used  .  Tobacco comment: Tobacco use-no  Substance and Sexual Activity  . Alcohol use: No  . Drug use: No  . Sexual activity: Never  Other Topics Concern  . Not on file  Social History Narrative   Pt lives at home alone.   Caffeine Use: very rarely   Social Determinants of Health   Financial Resource Strain: Low Risk   . Difficulty of Paying Living Expenses: Not hard at all  Food Insecurity: No Food Insecurity  . Worried About 12/02/1991 in the Last Year: Never true  . Ran Out of Food in the Last Year: Never true  Transportation Needs: No Transportation Needs  .  Lack of Transportation (Medical): No  . Lack of Transportation (Non-Medical): No  Physical Activity: Insufficiently Active  . Days of Exercise per Week: 3 days  . Minutes of Exercise per Session: 10 min  Stress: No Stress Concern Present  . Feeling of Stress : Only a little  Social Connections: Slightly Isolated  . Frequency of Communication with Friends and Family: More than three times a week  . Frequency of Social Gatherings with Friends and Family: Three times a week  . Attends Religious Services: 1 to 4 times per year  . Active Member of Clubs or Organizations: Yes  . Attends Archivist Meetings: 1 to 4 times per year  . Marital Status: Widowed  Intimate Partner Violence: Not At Risk  . Fear of Current or Ex-Partner: No  . Emotionally Abused: No  . Physically Abused: No  . Sexually Abused: No    Family History  Problem Relation Age of Onset  . Heart attack Father   . Heart disease Father   . Stroke Father   . Stroke Sister   . Hypertension Mother   . Stroke Mother   . Neuropathy Brother   . COPD Sister     ROS: no fevers or chills, productive cough, hemoptysis, dysphasia, odynophagia, melena, hematochezia, dysuria, hematuria, rash, seizure activity, orthopnea, PND, pedal edema, claudication. Remaining systems are negative.  Physical Exam: Well-developed well-nourished in no acute  distress.  Skin is warm and dry.  HEENT is normal.  Neck is supple.  Chest is clear to auscultation with normal expansion.  Cardiovascular exam is regular rate and rhythm.  Crisp mechanical valve sound.  2/6 systolic murmur and diastolic murmur left sternal border. Abdominal exam nontender or distended. No masses palpated. Extremities show no edema. neuro grossly intact  A/P  1 chronic systolic congestive heart failure-patient appears to be euvolemic today on examination.  Continue diuretic at present dose.  Check potassium and renal function.  2 prior aortic valve replacement-we will continue present dose of Coumadin and SBE prophylaxis.  Note her aspirin was discontinued previously because of thigh hematoma that developed at the time of combination Coumadin and aspirin.  We will plan conservative measures for her valvular heart disease given patient's age.  3 cardiomyopathy-continue beta-blocker.  She did not tolerate losartan due to low blood pressure.  4 coronary artery disease-no chest pain.  Continue statin.  She is not on aspirin given need for Coumadin and history of thigh hematoma on combination of Coumadin and aspirin.  5 hypertension-blood pressure controlled.  Continue present medical regimen.  6 hyperlipidemia-continue statin.  7 carotid artery disease-continue statin. Will not pursue fu carotid dopplers given age and overall medical condition.  Kirk Ruths, MD

## 2020-02-27 NOTE — Progress Notes (Signed)
   Covid-19 Vaccination Clinic  Name:  ZASHA BELLEAU    MRN: [id number] DOB: [birth year]  02/27/2020  [name] was observed post Covid-19 immunization for 15 minutes without incident. She was provided with Vaccine Information Sheet and instruction to access the V-Safe system.   Ms. Papandrea was instructed to call 911 with any severe reactions post vaccine: Marland Kitchen Difficulty breathing  . Swelling of face and throat  . A fast heartbeat  . A bad rash all over body  . Dizziness and weakness   Immunizations Administered    Name Date Dose VIS Date Route   Pfizer COVID-19 Vaccine 02/27/2020 11:53 AM 0.3 mL 10/26/2019 Intramuscular   Manufacturer: [employer], [employer]   Lot: [id number]   NDC: [address]-4

## 2020-03-03 ENCOUNTER — Ambulatory Visit (INDEPENDENT_AMBULATORY_CARE_PROVIDER_SITE_OTHER): Payer: Medicare Other | Admitting: Cardiology

## 2020-03-03 ENCOUNTER — Other Ambulatory Visit: Payer: Self-pay

## 2020-03-03 ENCOUNTER — Other Ambulatory Visit: Payer: Self-pay | Admitting: Cardiology

## 2020-03-03 ENCOUNTER — Encounter: Payer: Self-pay | Admitting: Cardiology

## 2020-03-03 VITALS — BP 128/60 | HR 60 | Ht 65.5 in | Wt 111.6 lb

## 2020-03-03 DIAGNOSIS — Z952 Presence of prosthetic heart valve: Secondary | ICD-10-CM | POA: Diagnosis not present

## 2020-03-03 DIAGNOSIS — I5022 Chronic systolic (congestive) heart failure: Secondary | ICD-10-CM

## 2020-03-03 DIAGNOSIS — E7849 Other hyperlipidemia: Secondary | ICD-10-CM | POA: Diagnosis not present

## 2020-03-03 DIAGNOSIS — I2510 Atherosclerotic heart disease of native coronary artery without angina pectoris: Secondary | ICD-10-CM | POA: Diagnosis not present

## 2020-03-03 DIAGNOSIS — I119 Hypertensive heart disease without heart failure: Secondary | ICD-10-CM | POA: Diagnosis not present

## 2020-03-03 DIAGNOSIS — I6350 Cerebral infarction due to unspecified occlusion or stenosis of unspecified cerebral artery: Secondary | ICD-10-CM

## 2020-03-03 DIAGNOSIS — K219 Gastro-esophageal reflux disease without esophagitis: Secondary | ICD-10-CM | POA: Diagnosis not present

## 2020-03-03 DIAGNOSIS — M2550 Pain in unspecified joint: Secondary | ICD-10-CM | POA: Diagnosis not present

## 2020-03-03 NOTE — Patient Instructions (Signed)
Medication Instructions:  NO CHANGE *If you need a refill on your cardiac medications before your next appointment, please call your pharmacy*   Lab Work: If you have labs (blood work) drawn today and your tests are completely normal, you will receive your results only by: . MyChart Message (if you have MyChart) OR . A paper copy in the mail If you have any lab test that is abnormal or we need to change your treatment, we will call you to review the results.   Follow-Up: At [facility], you and your health needs are our priority.  As part of our continuing mission to provide you with exceptional heart care, we have created designated Provider Care Teams.  These Care Teams include your primary Cardiologist (physician) and Advanced Practice Providers (APPs -  Physician Assistants and Nurse Practitioners) who all work together to provide you with the care you need, when you need it.  We recommend signing up for the patient portal called "MyChart".  Sign up information is provided on this After Visit Summary.  MyChart is used to connect with patients for Virtual Visits (Telemedicine).  Patients are able to view lab/test results, encounter notes, upcoming appointments, etc.  Non-urgent messages can be sent to your provider as well.   To learn more about what you can do with MyChart, go to [email or web address].    Your next appointment:   3 month(s)  The format for your next appointment:   In Person  Provider:   Brian Crenshaw, MD    

## 2020-03-04 ENCOUNTER — Encounter: Payer: Self-pay | Admitting: Cardiology

## 2020-03-04 ENCOUNTER — Ambulatory Visit (INDEPENDENT_AMBULATORY_CARE_PROVIDER_SITE_OTHER): Payer: Medicare Other | Admitting: *Deleted

## 2020-03-04 DIAGNOSIS — D631 Anemia in chronic kidney disease: Secondary | ICD-10-CM | POA: Diagnosis not present

## 2020-03-04 DIAGNOSIS — I0981 Rheumatic heart failure: Secondary | ICD-10-CM | POA: Diagnosis not present

## 2020-03-04 DIAGNOSIS — R5383 Other fatigue: Secondary | ICD-10-CM | POA: Diagnosis not present

## 2020-03-04 DIAGNOSIS — Z5181 Encounter for therapeutic drug level monitoring: Secondary | ICD-10-CM

## 2020-03-04 DIAGNOSIS — N1832 Chronic kidney disease, stage 3b: Secondary | ICD-10-CM | POA: Diagnosis not present

## 2020-03-04 DIAGNOSIS — D510 Vitamin B12 deficiency anemia due to intrinsic factor deficiency: Secondary | ICD-10-CM | POA: Diagnosis not present

## 2020-03-04 DIAGNOSIS — Z952 Presence of prosthetic heart valve: Secondary | ICD-10-CM | POA: Diagnosis not present

## 2020-03-04 DIAGNOSIS — E559 Vitamin D deficiency, unspecified: Secondary | ICD-10-CM | POA: Diagnosis not present

## 2020-03-04 DIAGNOSIS — J441 Chronic obstructive pulmonary disease with (acute) exacerbation: Secondary | ICD-10-CM | POA: Diagnosis not present

## 2020-03-04 DIAGNOSIS — I110 Hypertensive heart disease with heart failure: Secondary | ICD-10-CM | POA: Diagnosis not present

## 2020-03-04 DIAGNOSIS — I359 Nonrheumatic aortic valve disorder, unspecified: Secondary | ICD-10-CM

## 2020-03-04 DIAGNOSIS — I6350 Cerebral infarction due to unspecified occlusion or stenosis of unspecified cerebral artery: Secondary | ICD-10-CM | POA: Diagnosis not present

## 2020-03-04 DIAGNOSIS — I5043 Acute on chronic combined systolic (congestive) and diastolic (congestive) heart failure: Secondary | ICD-10-CM | POA: Diagnosis not present

## 2020-03-04 DIAGNOSIS — I130 Hypertensive heart and chronic kidney disease with heart failure and stage 1 through stage 4 chronic kidney disease, or unspecified chronic kidney disease: Secondary | ICD-10-CM | POA: Diagnosis not present

## 2020-03-04 DIAGNOSIS — E7849 Other hyperlipidemia: Secondary | ICD-10-CM | POA: Diagnosis not present

## 2020-03-04 LAB — POCT INR: INR: 1.5 — AB (ref 2.0–3.0)

## 2020-03-04 NOTE — Patient Instructions (Signed)
Description   Spoke with [name] PT Frances Furbish and instructed pt to take 1.5 tablets today then start taking 1/2 tablet daily except 1 tablet on Tuesdays, Thursdays, and Saturdays.  Repeat INR in 1 week.  Coumadin Clinic [phone].

## 2020-03-08 LAB — POCT INR: INR: 1.9 — AB (ref 2.0–3.0)

## 2020-03-09 IMAGING — CT CT HEAD W/O CM
3 series · 15 of 47 positions shown, 18 images · non-contrast
Comparison: 12/27/2008

CLINICAL DATA: Altered mental status. Coronavirus infection.

EXAM:
CT HEAD WITHOUT CONTRAST
TECHNIQUE: Contiguous axial images were obtained from the base of the skull
through the vertex without intravenous contrast.

[Series 2: head w o · axial · 0.44mm/px · z∈[+43,+168]mm · 9 of 31 slices shown, 12 images]
[im 3/31  brain]
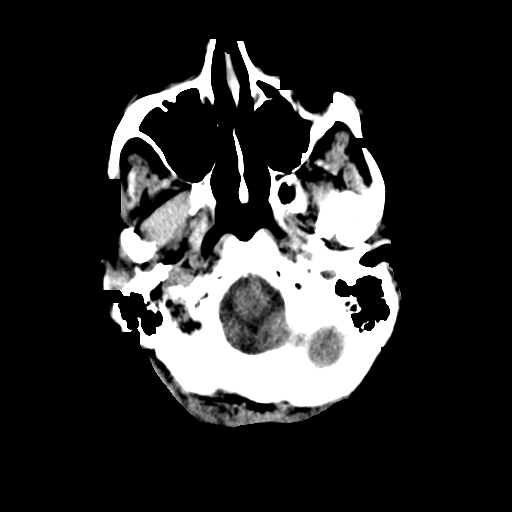
[im 3/31  bone]
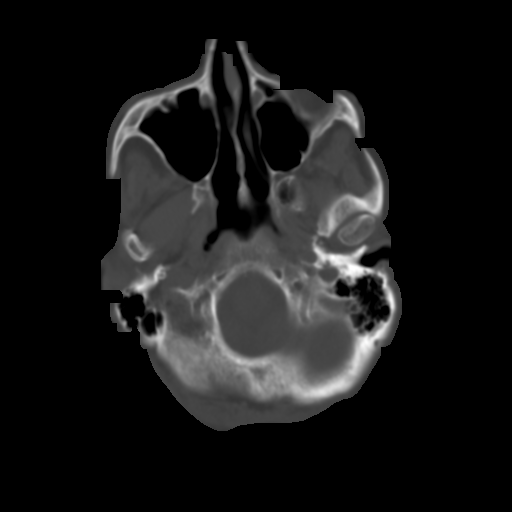
[im 6/31  brain]
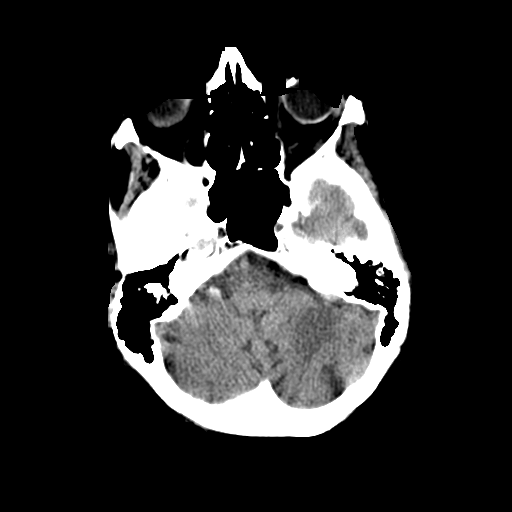
[im 9/31  brain]
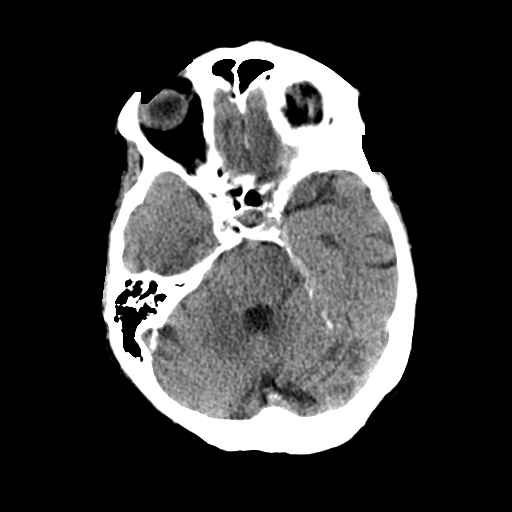
[im 12/31  brain]
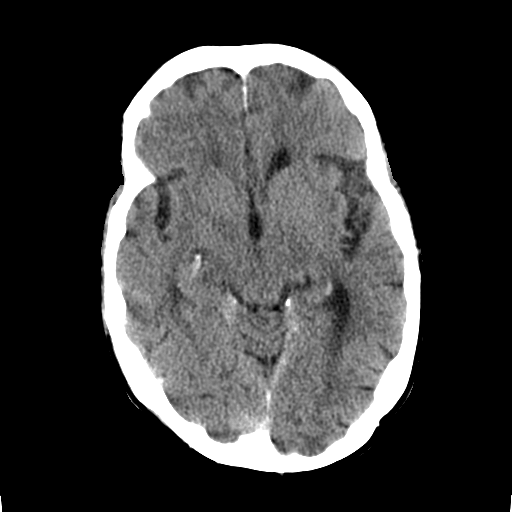
[im 16/31  brain]
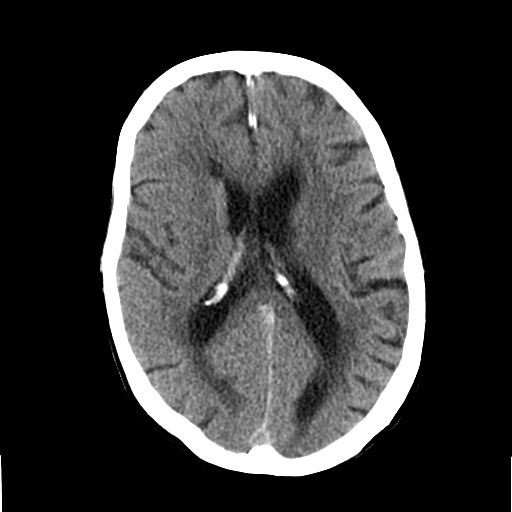
[im 16/31  bone]
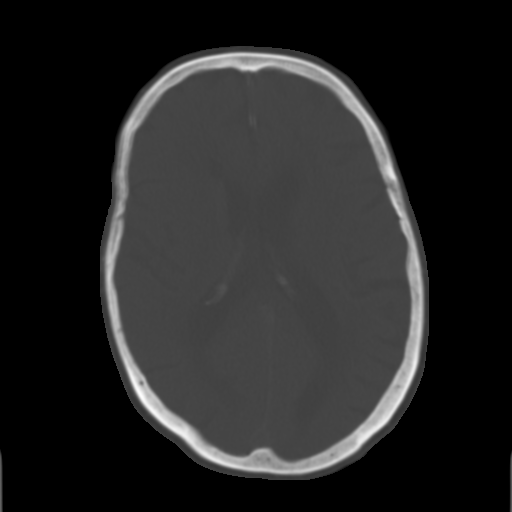
[im 19/31  brain]
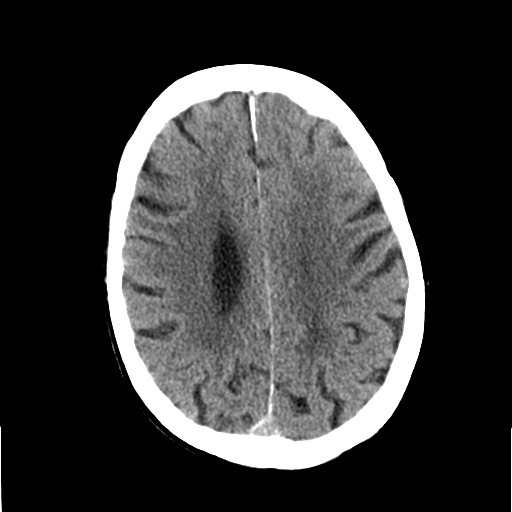
[im 22/31  brain]
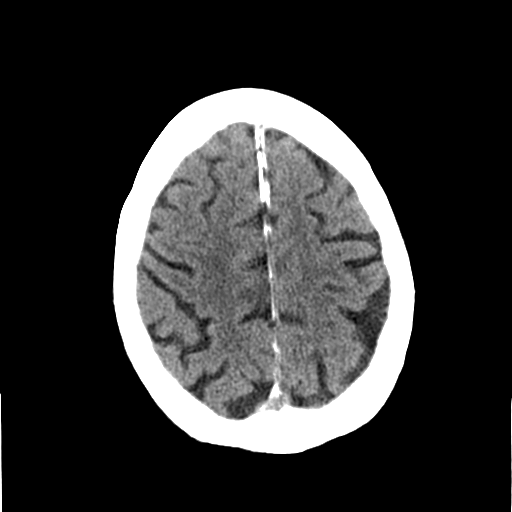
[im 25/31  brain]
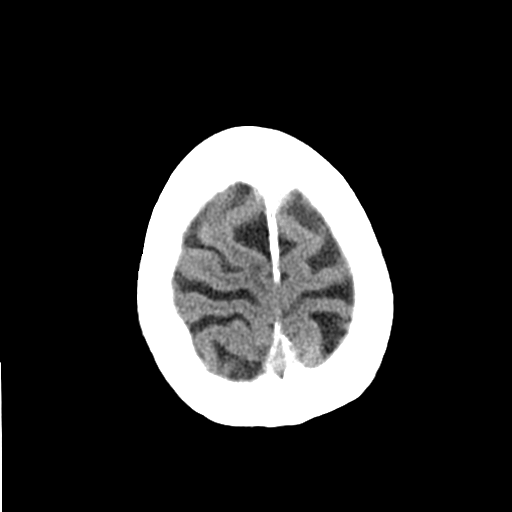
[im 28/31  brain]
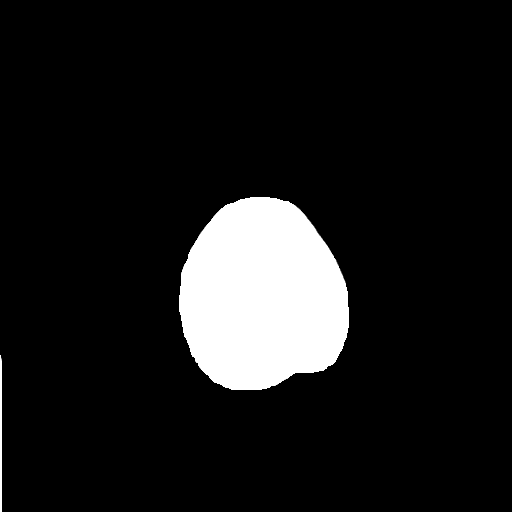
[im 28/31  bone]
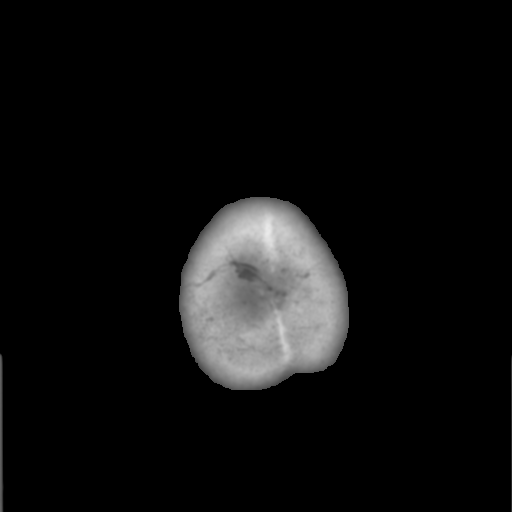

[Series 4: coronal soft · coronal · 0.31mm/px · 3 of 69 slices shown]
[im 23/69  brain]
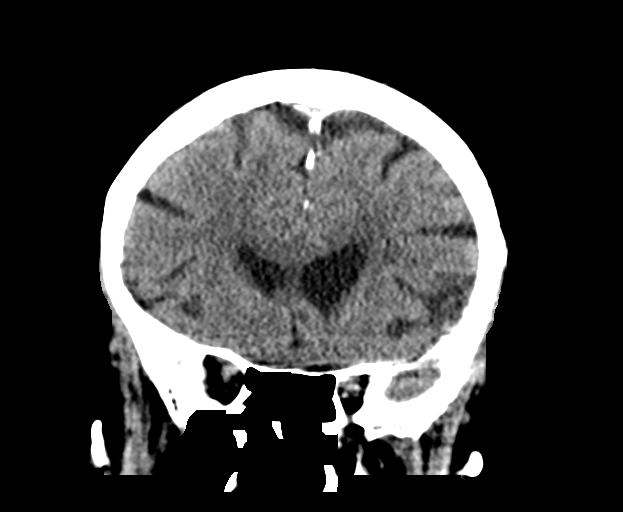
[im 31/69  brain]
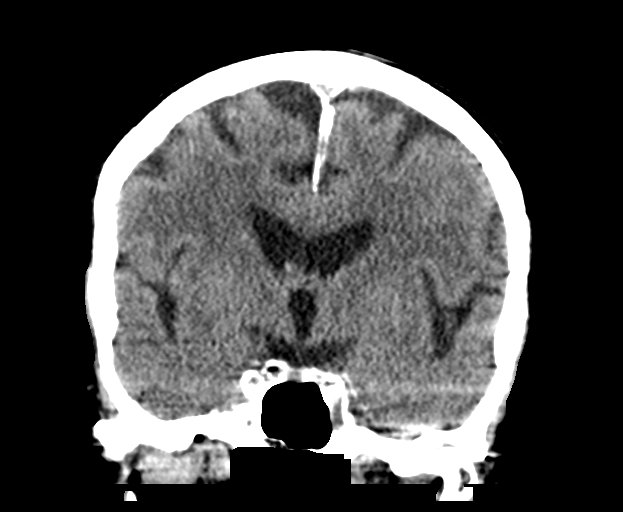
[im 38/69  brain]
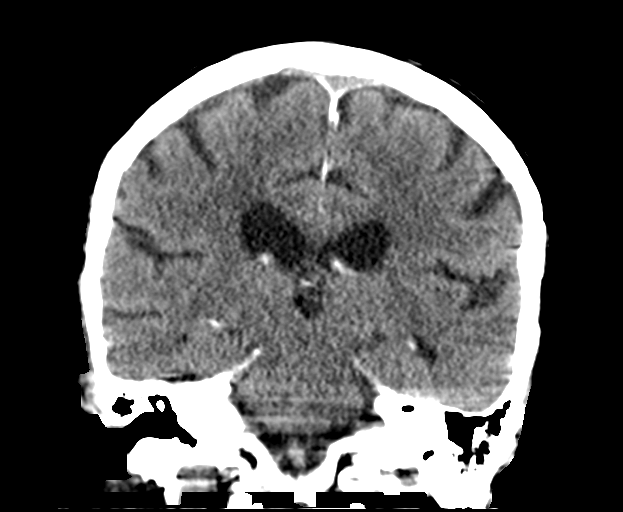

[Series 5: sagittal soft · sagittal · 0.31mm/px · 3 of 63 slices shown]
[im 21/63  brain]
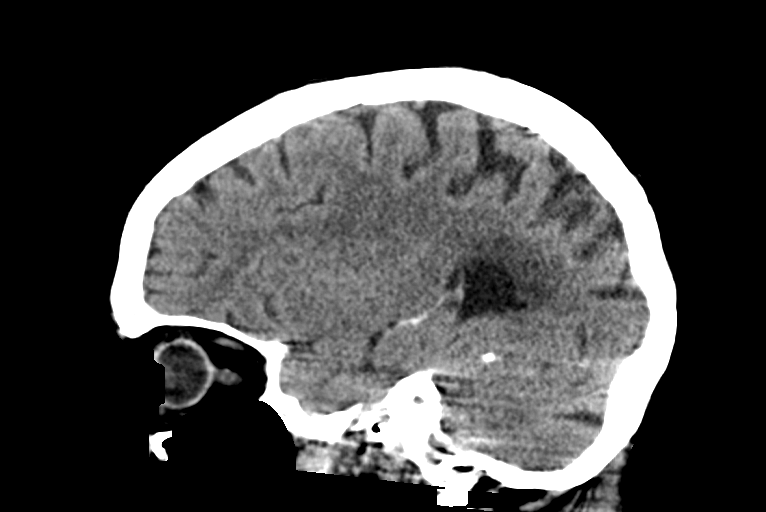
[im 32/63  brain]
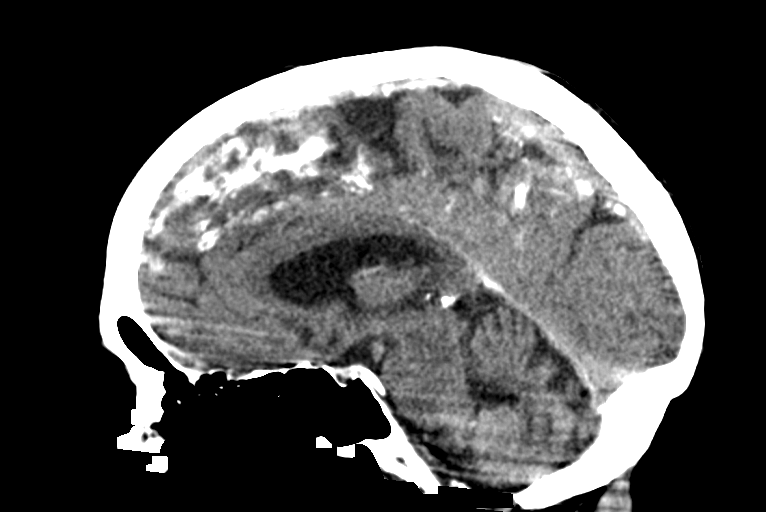
[im 42/63  brain]
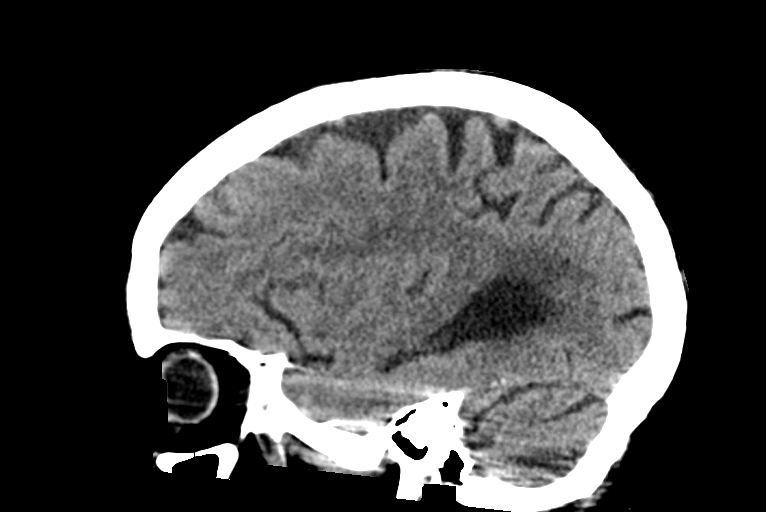

[15 of 47 positions shown; findings below may reference images not displayed]

FINDINGS: Brain: Age related atrophy. Chronic small-vessel ischemic changes of
the white matter. No sign of acute infarction, mass lesion,
hemorrhage, hydrocephalus or extra-axial collection.

Vascular: There is atherosclerotic calcification of the major
vessels at the base of the brain.

Skull: Negative

Sinuses/Orbits: Clear/normal

Other: None
IMPRESSION: No acute finding by CT. Age related atrophy and chronic small-vessel
ischemic changes of the white matter.

## 2020-03-10 ENCOUNTER — Ambulatory Visit (INDEPENDENT_AMBULATORY_CARE_PROVIDER_SITE_OTHER): Payer: Medicare Other | Admitting: Pharmacist Clinician (PhC)/ Clinical Pharmacy Specialist

## 2020-03-10 DIAGNOSIS — Z952 Presence of prosthetic heart valve: Secondary | ICD-10-CM

## 2020-03-10 DIAGNOSIS — Z5181 Encounter for therapeutic drug level monitoring: Secondary | ICD-10-CM

## 2020-03-10 DIAGNOSIS — I6350 Cerebral infarction due to unspecified occlusion or stenosis of unspecified cerebral artery: Secondary | ICD-10-CM

## 2020-03-10 DIAGNOSIS — I359 Nonrheumatic aortic valve disorder, unspecified: Secondary | ICD-10-CM

## 2020-03-11 ENCOUNTER — Ambulatory Visit (INDEPENDENT_AMBULATORY_CARE_PROVIDER_SITE_OTHER): Payer: Medicare Other | Admitting: Pharmacist Clinician (PhC)/ Clinical Pharmacy Specialist

## 2020-03-11 ENCOUNTER — Telehealth: Payer: Self-pay | Admitting: Cardiology

## 2020-03-11 DIAGNOSIS — I0981 Rheumatic heart failure: Secondary | ICD-10-CM | POA: Diagnosis not present

## 2020-03-11 DIAGNOSIS — N1832 Chronic kidney disease, stage 3b: Secondary | ICD-10-CM | POA: Diagnosis not present

## 2020-03-11 DIAGNOSIS — I130 Hypertensive heart and chronic kidney disease with heart failure and stage 1 through stage 4 chronic kidney disease, or unspecified chronic kidney disease: Secondary | ICD-10-CM | POA: Diagnosis not present

## 2020-03-11 DIAGNOSIS — Z952 Presence of prosthetic heart valve: Secondary | ICD-10-CM

## 2020-03-11 DIAGNOSIS — I359 Nonrheumatic aortic valve disorder, unspecified: Secondary | ICD-10-CM

## 2020-03-11 DIAGNOSIS — Z5181 Encounter for therapeutic drug level monitoring: Secondary | ICD-10-CM

## 2020-03-11 DIAGNOSIS — J441 Chronic obstructive pulmonary disease with (acute) exacerbation: Secondary | ICD-10-CM | POA: Diagnosis not present

## 2020-03-11 DIAGNOSIS — I5043 Acute on chronic combined systolic (congestive) and diastolic (congestive) heart failure: Secondary | ICD-10-CM | POA: Diagnosis not present

## 2020-03-11 DIAGNOSIS — I6350 Cerebral infarction due to unspecified occlusion or stenosis of unspecified cerebral artery: Secondary | ICD-10-CM

## 2020-03-11 DIAGNOSIS — D631 Anemia in chronic kidney disease: Secondary | ICD-10-CM | POA: Diagnosis not present

## 2020-03-11 LAB — POCT INR: INR: 1.8 — AB (ref 2.0–3.0)

## 2020-03-11 NOTE — Telephone Encounter (Signed)
New message  Patient's daughter is calling in to leave a message for Dr. Jens Som. States that patient's PCP will be sending blood results via fax to the office. Patient's daughter [name] states that if they are not received to give her a call back.

## 2020-03-12 DIAGNOSIS — J9621 Acute and chronic respiratory failure with hypoxia: Secondary | ICD-10-CM | POA: Diagnosis not present

## 2020-03-12 DIAGNOSIS — I2510 Atherosclerotic heart disease of native coronary artery without angina pectoris: Secondary | ICD-10-CM | POA: Diagnosis not present

## 2020-03-12 DIAGNOSIS — I248 Other forms of acute ischemic heart disease: Secondary | ICD-10-CM | POA: Diagnosis not present

## 2020-03-12 DIAGNOSIS — E7800 Pure hypercholesterolemia, unspecified: Secondary | ICD-10-CM | POA: Diagnosis not present

## 2020-03-12 DIAGNOSIS — N1832 Chronic kidney disease, stage 3b: Secondary | ICD-10-CM | POA: Diagnosis not present

## 2020-03-12 DIAGNOSIS — I130 Hypertensive heart and chronic kidney disease with heart failure and stage 1 through stage 4 chronic kidney disease, or unspecified chronic kidney disease: Secondary | ICD-10-CM | POA: Diagnosis not present

## 2020-03-12 DIAGNOSIS — J441 Chronic obstructive pulmonary disease with (acute) exacerbation: Secondary | ICD-10-CM | POA: Diagnosis not present

## 2020-03-12 DIAGNOSIS — H3530 Unspecified macular degeneration: Secondary | ICD-10-CM | POA: Diagnosis not present

## 2020-03-12 DIAGNOSIS — E785 Hyperlipidemia, unspecified: Secondary | ICD-10-CM | POA: Diagnosis not present

## 2020-03-12 DIAGNOSIS — J9622 Acute and chronic respiratory failure with hypercapnia: Secondary | ICD-10-CM | POA: Diagnosis not present

## 2020-03-12 DIAGNOSIS — D631 Anemia in chronic kidney disease: Secondary | ICD-10-CM | POA: Diagnosis not present

## 2020-03-12 DIAGNOSIS — M19072 Primary osteoarthritis, left ankle and foot: Secondary | ICD-10-CM | POA: Diagnosis not present

## 2020-03-12 DIAGNOSIS — M19071 Primary osteoarthritis, right ankle and foot: Secondary | ICD-10-CM | POA: Diagnosis not present

## 2020-03-12 DIAGNOSIS — I5043 Acute on chronic combined systolic (congestive) and diastolic (congestive) heart failure: Secondary | ICD-10-CM | POA: Diagnosis not present

## 2020-03-12 DIAGNOSIS — I6529 Occlusion and stenosis of unspecified carotid artery: Secondary | ICD-10-CM | POA: Diagnosis not present

## 2020-03-12 DIAGNOSIS — G609 Hereditary and idiopathic neuropathy, unspecified: Secondary | ICD-10-CM | POA: Diagnosis not present

## 2020-03-12 DIAGNOSIS — Z7901 Long term (current) use of anticoagulants: Secondary | ICD-10-CM | POA: Diagnosis not present

## 2020-03-12 DIAGNOSIS — Z9981 Dependence on supplemental oxygen: Secondary | ICD-10-CM | POA: Diagnosis not present

## 2020-03-12 DIAGNOSIS — I447 Left bundle-branch block, unspecified: Secondary | ICD-10-CM | POA: Diagnosis not present

## 2020-03-12 DIAGNOSIS — F0390 Unspecified dementia without behavioral disturbance: Secondary | ICD-10-CM | POA: Diagnosis not present

## 2020-03-12 DIAGNOSIS — E559 Vitamin D deficiency, unspecified: Secondary | ICD-10-CM | POA: Diagnosis not present

## 2020-03-12 DIAGNOSIS — F419 Anxiety disorder, unspecified: Secondary | ICD-10-CM | POA: Diagnosis not present

## 2020-03-12 DIAGNOSIS — I051 Rheumatic mitral insufficiency: Secondary | ICD-10-CM | POA: Diagnosis not present

## 2020-03-12 DIAGNOSIS — I0981 Rheumatic heart failure: Secondary | ICD-10-CM | POA: Diagnosis not present

## 2020-03-12 DIAGNOSIS — M170 Bilateral primary osteoarthritis of knee: Secondary | ICD-10-CM | POA: Diagnosis not present

## 2020-03-13 NOTE — Telephone Encounter (Signed)
Labs requested from dr [employer] office.

## 2020-03-13 NOTE — Telephone Encounter (Signed)
Spoke with pt daughter, aware labs reviewed by dr Jens Som and no changes in medications based on the results.

## 2020-03-14 ENCOUNTER — Telehealth: Payer: Self-pay

## 2020-03-14 NOTE — Telephone Encounter (Signed)
Telephone call to schedule palliative care visit.  RN spoke with patients daughter [name].  [name] in agreement with palliative care visit on 03/18/20 at 11:00 AM.

## 2020-03-17 DIAGNOSIS — K219 Gastro-esophageal reflux disease without esophagitis: Secondary | ICD-10-CM | POA: Diagnosis not present

## 2020-03-17 DIAGNOSIS — I110 Hypertensive heart disease with heart failure: Secondary | ICD-10-CM | POA: Diagnosis not present

## 2020-03-17 DIAGNOSIS — E7849 Other hyperlipidemia: Secondary | ICD-10-CM | POA: Diagnosis not present

## 2020-03-17 DIAGNOSIS — M818 Other osteoporosis without current pathological fracture: Secondary | ICD-10-CM | POA: Diagnosis not present

## 2020-03-18 ENCOUNTER — Other Ambulatory Visit: Payer: Medicare Other

## 2020-03-18 ENCOUNTER — Other Ambulatory Visit: Payer: Self-pay

## 2020-03-18 ENCOUNTER — Ambulatory Visit (INDEPENDENT_AMBULATORY_CARE_PROVIDER_SITE_OTHER): Payer: Medicare Other | Admitting: Pharmacist

## 2020-03-18 DIAGNOSIS — Z7901 Long term (current) use of anticoagulants: Secondary | ICD-10-CM

## 2020-03-18 DIAGNOSIS — D631 Anemia in chronic kidney disease: Secondary | ICD-10-CM | POA: Diagnosis not present

## 2020-03-18 DIAGNOSIS — I6350 Cerebral infarction due to unspecified occlusion or stenosis of unspecified cerebral artery: Secondary | ICD-10-CM

## 2020-03-18 DIAGNOSIS — J441 Chronic obstructive pulmonary disease with (acute) exacerbation: Secondary | ICD-10-CM | POA: Diagnosis not present

## 2020-03-18 DIAGNOSIS — I130 Hypertensive heart and chronic kidney disease with heart failure and stage 1 through stage 4 chronic kidney disease, or unspecified chronic kidney disease: Secondary | ICD-10-CM | POA: Diagnosis not present

## 2020-03-18 DIAGNOSIS — Z515 Encounter for palliative care: Secondary | ICD-10-CM

## 2020-03-18 DIAGNOSIS — N1832 Chronic kidney disease, stage 3b: Secondary | ICD-10-CM | POA: Diagnosis not present

## 2020-03-18 DIAGNOSIS — I0981 Rheumatic heart failure: Secondary | ICD-10-CM | POA: Diagnosis not present

## 2020-03-18 DIAGNOSIS — I5043 Acute on chronic combined systolic (congestive) and diastolic (congestive) heart failure: Secondary | ICD-10-CM | POA: Diagnosis not present

## 2020-03-18 LAB — POCT INR: INR: 2.4 (ref 2.0–3.0)

## 2020-03-18 NOTE — Progress Notes (Signed)
PATIENT NAME: [name] DOB: [birth year] MRN: [id number]  PRIMARY CARE PROVIDER: Lucia Gaskins, MD  RESPONSIBLE PARTY:  Acct ID - Guarantor Home Phone Work Phone Relationship Acct Type  [id number] [name]  Self P/F     [name], [place], [place] [address]    PLAN OF CARE and INTERVENTIONS:               1.  GOALS OF CARE/ ADVANCE CARE PLANNING:  Remin at home with caregiver being in the home with patient 24/7.               2.  PATIENT/CAREGIVER EDUCATION:Education on importance of weighing self daily, Education on heart healthy diet, education on fall precautions, support                 4. PERSONAL EMERGENCY PLAN: Patient has  24/7 caregiver in the home with her.               5.  DISEASE STATUS:  RN made scheduled palliative care home visit. Nurse met with patient and her caregiver [name]. Patient sitting in chair and living room with O2 in place via nasal cannula at 3.5 LPM.  Patient reports physical therapist with [facility] was in home earlier today.  Patient states she walked outside with therapist. Patient states she saw primary care provider Dr Cindie Laroche yesterday. Caregiver reports MD made no changes in patients current medications. Patient denies having any pain and appears to be doing well. Patient reports her weight this AM was 109 pounds. Patient has been weighing self daily and remains on 40 mg of Lasix daily. Education to a patient on need to continue weighing herself daily. Nurse also provided education should patient have increased shortness of breath or develop a cough to let him MD know. Patient uses her cane when she goes out but is independent with ambulation inside her home. Patient denies suffering any recent falls. Caregiver reports patients INR this morning and called into Clinic. Patients INR was 2.4 this AM.  Patient and caregiver go out for lunch everyday and patient states she enjoys getting out. Patient reports she has  shortness of breath on exertion and did feel short of breath when having therapy this AM.  Patient denies having any cough. Patient has no edema in her lower extremities. Patient's breath sounds are clear. Patient reports Iron supplement she takes makes her constipated and she has been taking stool softeners daily. Patient reports she is sleeping well at night. Nurse reviewed patient's medications with patient and caregiver. Patient and caregiver encouraged to contact palliative care with questions or concerns.    HISTORY OF PRESENT ILLNESS:  Patient is a 86 year old female who resides in own home with caregiver.  Patients family is supportive.   CODE STATUS: DNR  ADVANCED DIRECTIVES: N MOST FORM: N PPS: 50%   PHYSICAL EXAM:   VITALS: Today's Vitals   03/18/20 1121  BP: (!) 116/50  Pulse: (!) 53  Resp: 16  Temp: 97.8 F (36.6 C)  TempSrc: Temporal  SpO2: 98%  Weight: 109 lb (49.4 kg)  PainSc: 0-No pain    LUNGS: clear to auscultation  CARDIAC: Cor RRR  EXTREMITIES: none edema SKIN: no visible open areas of skin breakdown  NEURO: positive for memory problems and weakness       [name], RN

## 2020-03-25 ENCOUNTER — Ambulatory Visit (INDEPENDENT_AMBULATORY_CARE_PROVIDER_SITE_OTHER): Payer: Medicare Other | Admitting: Pharmacist

## 2020-03-25 ENCOUNTER — Other Ambulatory Visit: Payer: Self-pay | Admitting: Cardiology

## 2020-03-25 DIAGNOSIS — I0981 Rheumatic heart failure: Secondary | ICD-10-CM | POA: Diagnosis not present

## 2020-03-25 DIAGNOSIS — Z952 Presence of prosthetic heart valve: Secondary | ICD-10-CM | POA: Diagnosis not present

## 2020-03-25 DIAGNOSIS — D631 Anemia in chronic kidney disease: Secondary | ICD-10-CM | POA: Diagnosis not present

## 2020-03-25 DIAGNOSIS — J441 Chronic obstructive pulmonary disease with (acute) exacerbation: Secondary | ICD-10-CM | POA: Diagnosis not present

## 2020-03-25 DIAGNOSIS — I6350 Cerebral infarction due to unspecified occlusion or stenosis of unspecified cerebral artery: Secondary | ICD-10-CM

## 2020-03-25 DIAGNOSIS — I5043 Acute on chronic combined systolic (congestive) and diastolic (congestive) heart failure: Secondary | ICD-10-CM | POA: Diagnosis not present

## 2020-03-25 DIAGNOSIS — I130 Hypertensive heart and chronic kidney disease with heart failure and stage 1 through stage 4 chronic kidney disease, or unspecified chronic kidney disease: Secondary | ICD-10-CM | POA: Diagnosis not present

## 2020-03-25 DIAGNOSIS — N1832 Chronic kidney disease, stage 3b: Secondary | ICD-10-CM | POA: Diagnosis not present

## 2020-03-25 LAB — POCT INR: INR: 2.1 (ref 2.0–3.0)

## 2020-03-26 ENCOUNTER — Other Ambulatory Visit: Payer: Self-pay

## 2020-03-26 ENCOUNTER — Ambulatory Visit (INDEPENDENT_AMBULATORY_CARE_PROVIDER_SITE_OTHER): Payer: Medicare Other | Admitting: Internal Medicine

## 2020-03-26 ENCOUNTER — Encounter: Payer: Self-pay | Admitting: Internal Medicine

## 2020-03-26 DIAGNOSIS — J449 Chronic obstructive pulmonary disease, unspecified: Secondary | ICD-10-CM

## 2020-03-26 DIAGNOSIS — J9611 Chronic respiratory failure with hypoxia: Secondary | ICD-10-CM

## 2020-03-26 DIAGNOSIS — I6350 Cerebral infarction due to unspecified occlusion or stenosis of unspecified cerebral artery: Secondary | ICD-10-CM | POA: Diagnosis not present

## 2020-03-26 DIAGNOSIS — J9612 Chronic respiratory failure with hypercapnia: Secondary | ICD-10-CM

## 2020-03-26 NOTE — Patient Instructions (Signed)
No change in medications    Please schedule a follow up visit in 3 months but call sooner if needed = [facility]

## 2020-03-26 NOTE — Assessment & Plan Note (Signed)
Placed on 02 2014 by Dr [name]  - advised 01/01/2020 :  Monitor sats with goal of > 94% at rest and keep > 90% with ex -  HC03  03/04/20   =  36   Well compensated on 2lpm hs and prn daytime  Reminded :  Make sure you check your oxygen saturations at highest level of activity to be sure it stays 90-95 (no higher)  and adjust upward to maintain this level if needed but remember to turn it back to previous settings when you stop (to conserve your supply).          Each maintenance medication was reviewed in detail including emphasizing most importantly the difference between maintenance and prns and under what circumstances the prns are to be triggered using an action plan format where appropriate.  Total time for H and P, chart review, counseling, teaching device and generating customized AVS unique to this office visit / charting = 20 min

## 2020-03-26 NOTE — Progress Notes (Signed)
[name], female    DOB: [birth year],    MRN: [id number]   Brief patient profile:  29 yowf pt of [name] previously - she reports she quit smoking around 1997 s resp symptoms then  and placed on 02 around 2014 with dx of systolic chf and maint on anoro    Admit Date: 10/25/2019 Discharge date: 10/30/2019    CODE STATUS: DNR      Brief Summary: See H&P, Labs, Consult and Test reports for all details in brief, Patient is a89 y.o.femalewith PMHx of chronic systolic heart failure, CAD s/p CABG, mechanical aortic valve on anticoagulation, COPD on 3 L of oxygen at home-presented with shortness of breath-found to have acute hypoxic respiratory failure in the setting of COVID-19 pneumonia and decompensated systolic heart failure  Brief Hospital Course: Acute on chronic hypoxic Resp Failure due to Covid 19 Viral pneumonia and decompensated systolic heart failure (EF 25-30% March 2020):Stable-on just 2 L of oxygen.  Treated with Lasix along with steroids and remdesivir, inflammatory markers are slowly downtrending. Volume status is stable-on oral diuretic regimen.  Patient will complete remdesivir on 12/15-she will be discharged on a few days of tapering steroids.    COVID-19 Labs:  Recent Labs (last 2 labs)        Recent Labs    10/28/19 0505 10/29/19 0326 10/30/19 0428  DDIMER 1.48* 0.75* 0.84*  FERRITIN 1,999* 1,131* 769*  CRP 8.4* 5.5* 3.8*      Recent Labs       Lab Results  Component Value Date   SARSCOV2NAA DETECTED (A) 10/20/2019       COVID-19 Medications: Steroids: 12/11>>12/15 Remdesivir: 12/10>>  Other medications: Diuretics:Euvolemic, negative balance of 2.3 L-on oral diuretic regimen Antibiotics:Not needed as no evidence of bacterial infection Insulin: CBG stable with SSI. A1c 5.7 on 12/12.  Nausea/Vomiting:Probably due to COVID-19-continue supportive care-as needed antiemetics.  Elevated troponin: Secondary demand  ischemia-prior hospitalist Dr. Marinus Maw with cardiology-recommendations are for conservative management.  Transaminitis: Appears mild-secondary to COVID-19-has resolved by the time of discharge.  AKIon CKD stage IIIa:AKI improved-continue to monitor closely while on diuretics.  COPD-on 3 L of oxygen at home:No signs of exacerbation-continue bronchodilators  CAD s/p CABG:No anginal symptoms-see above regarding troponins  History of aortic valve replacement-mechanical valve: On Coumadin-INR supratherapeutic despite of holding Coumadin for several days-managed by pharmacy.  Spoke with pharmacy team-continue to hold Coumadin on discharge until INR is checked in 2 days.-Case management will arrange for INR check by home health RN on 12/17 subsequently dosing of Coumadin will be adjusted by the Coumadin clinic.  Dementia:Appears to be mild-continue Aricept    History of Present Illness  01/01/2020  Pulmonary/ 1st office eval/Sheridan Gettel  Chief Complaint  Patient presents with  . Consult    Former Dr. [name] patient for severe COPD. Currently on 3L. Uses 4L at home. States her breathing has been ok.   Dyspnea: Baseline  goes to gym rides bike x 20 min s stopping on RA does not monitor sats Cough: none now Sleep: no resp ccs x 30 degrees HOB SABA use: neb daily "whether needs it or not "  (says doesn't think she does) rec Plan A = Automatic = Always=    Anoro one click each am  Plan B = Backup (does not replace plan A, it supplements it  - only use your levoalbuterol nebulizer up to every 4 hours if you can't catch your breath  Make sure you check your oxygen saturations at highest  level of activity to be sure it stays over 90%   Goal at rest is to keep 02 above 94% at rest for your heart   Admit date: 02/05/2020 Discharge date: 02/09/2020     Home Health: yes Equipment/Devices: HHPT, RN, 3-4L [place]  Discharge Condition: Stable CODE STATUS: DNR Diet recommendation: Heart  Healthy   Brief/Interim Summary: 86 year old female with a history of systolic and diastolic CHF, chronic respiratory failure on 4 L oxygen, hyperlipidemia, coronary artery disease, LBBB, coronary disease, CKD stage III, AVR presented with 1 to 2 days of worsening shortness of breath. The patient states that she has had intermittent shortness of breath for at least 2 weeks with worsening over the last 1 to 2 days prior to admission. She denies any fevers, chills, chest pain, nausea, vomiting, diarrhea, abdominal pain. Upon presentation, the patient was noted to be fluid overloaded with chest x-ray showing diffuse bilateral interstitial prominence and BNP 1712. She was started on IV furosemide with good clinical effect. During the am 3/25, she became more dyspneic. Repeat CXR and labs were ordered and duonebs were started.  Discharge Diagnoses:  Acute on chronic systolic and diastolic CHF -pt suppose to take 20 mg po bid at home, but she only takes it qday -01/22/2019 echo EF 25-30%, diffuse HK, moderate MR, moderate AI -Continue IV furosemide--dose increased to 40 bid when pt had resp distress 3/25 -Remains fluid overloaded with JVD -Daily weights -Accurate I's and O's--NEG3.1L -SARS-CoV2--neg -d/c home with lasix 40 mg daily  Acute onChronic respiratory failure with hypoxia and hypercarbia -usually on 4L at home -pt on 3L at time of d/c with saturation 97-98% -3/25--repeat CXR--personally reviewed--increased interstitial markings, sm pleural effusions -3/26--sob improved  CKD stage IIIb -Baseline creatinine 0.9-1.2 -Monitor with diuresis -serum creatinine 1.10 on day of d/c  COPD -Stable without wheezing -DiscontinuedIV steroids 3/24 -continuedduonebs -continuedpulmicort  Coronary artery disease status post CABG -No chest pain -Continue carvedilol -continue statin  Status post AVR -Continue warfarin with pharmacy  assistance  Hypokalemia -Repleted -Magnesium2.4  Hypertension -Blood pressure acceptable -Continue carvedilol  Cognitive impairment -Continue Aricept  Hyperlipidemia -Continue statin  LBBB -chronic  03/26/2020  f/u ov/Jennelle Pinkstaff re: COPD Group B  Chief Complaint  Patient presents with  . Follow-up    breathing doing better since hospital visit in march   Dyspnea:  Room to room /  [facility] to mailbox with therapist s 02  Cough: none  Sleeping: hob up about 30 degrees  SABA use: nebulizer q 9 pm  02: 3.5 hs/  And prn daytime      No obvious day to day or daytime variability or assoc excess/ purulent sputum or mucus plugs or hemoptysis or cp or chest tightness, subjective wheeze or overt sinus or hb symptoms.   Sleeping as above  without nocturnal  or early am exacerbation  of respiratory  c/o's or need for noct saba. Also denies any obvious fluctuation of symptoms with weather or environmental changes or other aggravating or alleviating factors except as outlined above   No unusual exposure hx or h/o childhood pna/ asthma or knowledge of premature birth.  Current Allergies, Complete Past Medical History, Past Surgical History, Family History, and Social History were reviewed in [employer] record.  ROS  The following are not active complaints unless bolded Hoarseness, sore throat, dysphagia, dental problems, itching, sneezing,  nasal congestion or discharge of excess mucus or purulent secretions, ear ache,   fever, chills, sweats, unintended wt loss or wt  gain, classically pleuritic or exertional cp,  orthopnea pnd or arm/hand swelling  or leg swelling, presyncope, palpitations, abdominal pain, anorexia, nausea, vomiting, diarrhea  or change in bowel habits or change in bladder habits, change in stools or change in urine, dysuria, hematuria,  rash, arthralgias, visual complaints, headache, numbness, weakness or ataxia or problems with walking or  coordination,  change in mood or  memory.        Current Meds  Medication Sig  . acetaminophen (TYLENOL) 650 MG CR tablet Take 650 mg by mouth every 8 (eight) hours as needed for pain.  Marland Kitchen ALPRAZolam (XANAX) 0.25 MG tablet Take 0.125-0.25 mg by mouth at bedtime as needed for anxiety.   . Ascorbic Acid (VITAMIN C) 500 MG CAPS Take 500 mg by mouth daily.  Marland Kitchen atorvastatin (LIPITOR) 40 MG tablet TAKE ONE TABLET BY MOUTH ONCE DAILY. (Patient taking differently: Take 40 mg by mouth daily. )  . baclofen (LIORESAL) 10 MG tablet Take 10 mg by mouth as needed for muscle spasms.  . carvedilol (COREG) 6.25 MG tablet Take 6.25 mg by mouth 2 (two) times daily with a meal.  . docusate sodium (COLACE) 100 MG capsule Take 100 mg by mouth at bedtime.   . donepezil (ARICEPT) 10 MG tablet Take 1 tablet (10 mg total) by mouth daily.  . ergocalciferol (VITAMIN D2) 50000 UNITS capsule Take 50,000 Units by mouth every Tuesday.   . ferrous sulfate 325 (65 FE) MG EC tablet Take 325 mg by mouth daily.   . furosemide (LASIX) 40 MG tablet Take 1 tablet (40 mg total) by mouth daily.  Marland Kitchen HYDROcodone-acetaminophen (NORCO) 7.5-325 MG tablet Take 1 tablet by mouth every 6 (six) hours as needed for moderate pain.  . hydrocortisone (ANUSOL-HC) 2.5 % rectal cream Place 1 application rectally 3 (three) times daily as needed for hemorrhoids or anal itching.  . levalbuterol (XOPENEX) 0.63 MG/3ML nebulizer solution Take 3 mLs (0.63 mg total) by nebulization every 4 (four) hours as needed for wheezing or shortness of breath.  . meclizine (ANTIVERT) 25 MG tablet Take 25 mg by mouth 3 (three) times daily as needed for dizziness.  . Melatonin 3 MG TABS Take 1 tablet by mouth at bedtime.  . methylPREDNISolone (MEDROL) 4 MG tablet Take 4 mg by mouth 2 (two) times daily as needed.  . multivitamin-lutein (OCUVITE-LUTEIN) CAPS capsule Take 1 capsule by mouth daily.  . ondansetron (ZOFRAN) 4 MG tablet Take 4 mg by mouth every 8 (eight) hours as  needed for nausea or vomiting.  . polyethylene glycol (MIRALAX / GLYCOLAX) 17 g packet Take 17 g by mouth daily as needed for moderate constipation.  . pregabalin (LYRICA) 75 MG capsule Take 1 capsule (75 mg total) by mouth every other day.  . spironolactone (ALDACTONE) 25 MG tablet TAKE (1/2) TABLET BY MOUTH DAILY. (Patient taking differently: Take 12.5 mg by mouth daily. )  . traMADol (ULTRAM) 50 MG tablet Take 50 mg by mouth every 6 (six) hours as needed for moderate pain.  . traZODone (DESYREL) 50 MG tablet Take 0.5-1 tablets (25-50 mg total) by mouth at bedtime as needed for sleep.  Marland Kitchen umeclidinium-vilanterol (ANORO ELLIPTA) 62.5-25 MCG/INH AEPB Inhale 1 puff into the lungs daily.   Marland Kitchen warfarin (COUMADIN) 5 MG tablet TAKE 1/2 TO 1 TABLET ONCE DAILY AS DIRECTED BY COUMADIN CLINIC.  Marland Kitchen zinc sulfate 50 MG CAPS capsule Take 50 mg by mouth 2 (two) times daily.  Past Medical History:  Diagnosis Date  . Anemia   . Anxiety   . Arthritis   . Blood transfusion without reported diagnosis   . Carotid artery stenosis    Without infarction  . Cataract   . Chronic combined systolic (congestive) and diastolic (congestive) heart failure ([place])   . COPD (chronic obstructive pulmonary disease) ([place])   . Coronary artery disease   . Heart valve replaced by other means   . Hypercholesterolemia    Pure  . Hypertension    Unspecified  . LBBB (left bundle branch block)   . Macular degeneration (senile) of retina, unspecified   . On home O2   . Postsurgical aortocoronary bypass status   . Stroke ([place])   . Transient global amnesia   . Unspecified hereditary and idiopathic peripheral neuropathy   . Unspecified vitamin D deficiency        Objective:    Wt Readings from Last 3 Encounters:  03/26/20 112 lb (50.8 kg)  03/18/20 109 lb (49.4 kg)  03/03/20 111 lb 9.6 oz (50.6 kg)      Vital signs reviewed  03/26/2020  - Note at rest 02 sats  92% on RA and bp 82/50 but not symptoms     Thin amb wf very HOH    HEENT : pt wearing mask not removed for exam due to covid - 19 concerns.    NECK :  without JVD/Nodes/TM/ nl carotid upstrokes bilaterally   LUNGS: no acc muscle use,  Mild barrel  contour chest wall with bilateral  Distant bs s audible wheeze and  without cough on insp or exp maneuvers  and mild  Hyperresonant  to  percussion bilaterally     CV:  RRR  no s3 or murmur or increase in P2, and no edema   ABD:  soft and nontender with pos end  insp Hoover's  in the supine position. No bruits or organomegaly appreciated, bowel sounds nl  MS:   Nl gait/  ext warm without deformities, calf tenderness, cyanosis or clubbing No obvious joint restrictions   SKIN: warm and dry without lesions    NEURO:  alert, approp, nl sensorium with  no motor or cerebellar deficits apparent.          I personally reviewed images and agree with radiology impression as follows:  pCXR:   02/07/20 Persistent findings of CHF with small bilateral pleural effusions and pulmonary edema.  Basilar airspace disease left greater than right likely atelectasis. Difficult to exclude developing infection at the left lung base             Assessment

## 2020-03-26 NOTE — Assessment & Plan Note (Signed)
Quit smoking around 1997  - maint on anoro and prn xopenex neb as of ov  03/26/2020   Clearly recent acute flare was related to chf and not aecopd per Dr Don Perking notes.  Therefore pt stilll Pt is Group B in terms of symptom/risk and laba/lama therefore appropriate rx at this point >>>  anoro best choice - if flares then Trelegy needs to be considered and is an easy option for her as she already uses the elipta device.

## 2020-04-01 ENCOUNTER — Ambulatory Visit: Payer: Medicare Other | Admitting: Internal Medicine

## 2020-04-01 DIAGNOSIS — J441 Chronic obstructive pulmonary disease with (acute) exacerbation: Secondary | ICD-10-CM | POA: Diagnosis not present

## 2020-04-01 DIAGNOSIS — I5043 Acute on chronic combined systolic (congestive) and diastolic (congestive) heart failure: Secondary | ICD-10-CM | POA: Diagnosis not present

## 2020-04-01 DIAGNOSIS — D631 Anemia in chronic kidney disease: Secondary | ICD-10-CM | POA: Diagnosis not present

## 2020-04-01 DIAGNOSIS — I130 Hypertensive heart and chronic kidney disease with heart failure and stage 1 through stage 4 chronic kidney disease, or unspecified chronic kidney disease: Secondary | ICD-10-CM | POA: Diagnosis not present

## 2020-04-01 DIAGNOSIS — I0981 Rheumatic heart failure: Secondary | ICD-10-CM | POA: Diagnosis not present

## 2020-04-01 DIAGNOSIS — N1832 Chronic kidney disease, stage 3b: Secondary | ICD-10-CM | POA: Diagnosis not present

## 2020-04-03 ENCOUNTER — Other Ambulatory Visit: Payer: Self-pay | Admitting: Cardiology

## 2020-04-08 ENCOUNTER — Ambulatory Visit (INDEPENDENT_AMBULATORY_CARE_PROVIDER_SITE_OTHER): Payer: Medicare Other | Admitting: Pharmacist

## 2020-04-08 DIAGNOSIS — Z952 Presence of prosthetic heart valve: Secondary | ICD-10-CM | POA: Diagnosis not present

## 2020-04-08 DIAGNOSIS — J441 Chronic obstructive pulmonary disease with (acute) exacerbation: Secondary | ICD-10-CM | POA: Diagnosis not present

## 2020-04-08 DIAGNOSIS — I130 Hypertensive heart and chronic kidney disease with heart failure and stage 1 through stage 4 chronic kidney disease, or unspecified chronic kidney disease: Secondary | ICD-10-CM | POA: Diagnosis not present

## 2020-04-08 DIAGNOSIS — Z5181 Encounter for therapeutic drug level monitoring: Secondary | ICD-10-CM

## 2020-04-08 DIAGNOSIS — I6350 Cerebral infarction due to unspecified occlusion or stenosis of unspecified cerebral artery: Secondary | ICD-10-CM

## 2020-04-08 DIAGNOSIS — N1832 Chronic kidney disease, stage 3b: Secondary | ICD-10-CM | POA: Diagnosis not present

## 2020-04-08 DIAGNOSIS — D631 Anemia in chronic kidney disease: Secondary | ICD-10-CM | POA: Diagnosis not present

## 2020-04-08 DIAGNOSIS — I0981 Rheumatic heart failure: Secondary | ICD-10-CM | POA: Diagnosis not present

## 2020-04-08 DIAGNOSIS — I359 Nonrheumatic aortic valve disorder, unspecified: Secondary | ICD-10-CM

## 2020-04-08 DIAGNOSIS — I5043 Acute on chronic combined systolic (congestive) and diastolic (congestive) heart failure: Secondary | ICD-10-CM | POA: Diagnosis not present

## 2020-04-08 LAB — POCT INR: INR: 2.3 (ref 2.0–3.0)

## 2020-04-15 ENCOUNTER — Other Ambulatory Visit: Payer: Self-pay | Admitting: Family Medicine

## 2020-04-22 ENCOUNTER — Other Ambulatory Visit: Payer: Self-pay

## 2020-04-22 ENCOUNTER — Ambulatory Visit (INDEPENDENT_AMBULATORY_CARE_PROVIDER_SITE_OTHER): Payer: Medicare Other | Admitting: Pharmacist

## 2020-04-22 ENCOUNTER — Other Ambulatory Visit: Payer: Medicare Other

## 2020-04-22 DIAGNOSIS — I359 Nonrheumatic aortic valve disorder, unspecified: Secondary | ICD-10-CM

## 2020-04-22 DIAGNOSIS — Z952 Presence of prosthetic heart valve: Secondary | ICD-10-CM | POA: Diagnosis not present

## 2020-04-22 DIAGNOSIS — I6350 Cerebral infarction due to unspecified occlusion or stenosis of unspecified cerebral artery: Secondary | ICD-10-CM | POA: Diagnosis not present

## 2020-04-22 DIAGNOSIS — Z5181 Encounter for therapeutic drug level monitoring: Secondary | ICD-10-CM | POA: Diagnosis not present

## 2020-04-22 DIAGNOSIS — Z515 Encounter for palliative care: Secondary | ICD-10-CM

## 2020-04-22 LAB — POCT INR: INR: 2.3 (ref 2.0–3.0)

## 2020-04-22 NOTE — Progress Notes (Signed)
PATIENT NAME: [name] DOB: Jun 16, 1926 MRN: [id number]  PRIMARY CARE PROVIDER: Lucia Gaskins, MD  RESPONSIBLE PARTY:  Acct ID - Guarantor Home Phone Work Phone Relationship Acct Type  [id number] Colleen Mule347 [phone]  Self P/F     [name], [place], [place] [address]    PLAN OF CARE and INTERVENTIONS:               1.  GOALS OF CARE/ ADVANCE CARE PLANNING:  Remain at home with 24/7 caregiver and function at optimal level.               2.  PATIENT/CAREGIVER EDUCATION: Education on need to weigh daily, education on s/s of infection, reviewed meds, support                 3.  DISEASE STATUS: RN made scheduled palliative care home visit. Nurse met with patient and patients hired caregiver [name]. Patient and caregiver had been out to lunch. Patient sitting in her recliner reading a book. Patient denies having pain at the present time. Patient is hard of hearing. Patient alert and oriented and is able to ambulate with cane. Patient enjoys going out to eat. Patient receiving O2 via nasal cannula at 4 liters per minute. Patients weight this AM  was 109 pounds. Patients INR checked today and INR was 2.3. Coumadin Clinic informed daughter [name] that patients Coumadin  Dose is to remain the same. Patient reports her appetite is pretty good. Patient breath sounds are clear and patient denies having any cough. Patient reports she tires easily and has shortness of breath on exertion. Patient reports she sleeps well once she falls asleep but did not fall asleep until 2:30 AM and was out of bed at 7:30 AM.  Patients vital signs are stable. Patient had MD appointment with cardiologist on May 12. MD made no changes in patients current medications. Patient talkative at visit and reports her goal is to remain at home for as long as possible. Nurse reviewed patient's medication with patient and caregiver. Patient has trace edema in her lower extremities. Patient and caregiver encouraged to contact  palliative care with questions or concerns.         HISTORY OF PRESENT ILLNESS: Patient is a 86 year old female who resides at home.  Patient is followed by palliative care and is seen monthly and PRN.     CODE STATUS: DNR  ADVANCED DIRECTIVES: N MOST FORM: No PPS: 50%   PHYSICAL EXAM:   VITALS: Today's Vitals   04/22/20 1413  Weight: 109 lb (49.4 kg)  PainSc: 0-No pain    LUNGS: clear to auscultation  CARDIAC: Cor Brady  EXTREMITIES: Trace edema SKIN: Skin color, texture, turgor normal. No rashes or lesions  NEURO: positive for weakness       [name], RN

## 2020-04-26 ENCOUNTER — Other Ambulatory Visit: Payer: Self-pay | Admitting: Orthopaedic Surgery

## 2020-04-30 ENCOUNTER — Other Ambulatory Visit: Payer: Self-pay

## 2020-04-30 ENCOUNTER — Encounter (INDEPENDENT_AMBULATORY_CARE_PROVIDER_SITE_OTHER): Payer: Self-pay | Admitting: Ophthalmology

## 2020-04-30 ENCOUNTER — Ambulatory Visit (INDEPENDENT_AMBULATORY_CARE_PROVIDER_SITE_OTHER): Payer: Medicare Other | Admitting: Ophthalmology

## 2020-04-30 DIAGNOSIS — H353212 Exudative age-related macular degeneration, right eye, with inactive choroidal neovascularization: Secondary | ICD-10-CM | POA: Diagnosis not present

## 2020-04-30 DIAGNOSIS — H35721 Serous detachment of retinal pigment epithelium, right eye: Secondary | ICD-10-CM

## 2020-04-30 DIAGNOSIS — H353221 Exudative age-related macular degeneration, left eye, with active choroidal neovascularization: Secondary | ICD-10-CM

## 2020-04-30 MED ORDER — BEVACIZUMAB CHEMO INJECTION 1.25MG/0.05ML SYRINGE FOR KALEIDOSCOPE
1.2500 mg | INTRAVITREAL | Status: AC | PRN
  Administered 2020-04-30: 1.25 mg via INTRAVITREAL

## 2020-04-30 NOTE — Assessment & Plan Note (Signed)
Today at 16-month follow-up after significant illness experienced in her life.  New active CNVM subfoveal left eye, will start use of intravitreal Avastin OS today and examination in 5 weeks

## 2020-04-30 NOTE — Assessment & Plan Note (Signed)
OD the condition still stable

## 2020-04-30 NOTE — Progress Notes (Signed)
04/30/2020     CHIEF COMPLAINT Patient presents for Retina Follow Up   HISTORY OF PRESENT ILLNESS: [name] is a 86 y.o. female who presents to the clinic today for:   HPI    Retina Follow Up    Patient presents with  Wet AMD.  In left eye.  This started 7 months ago.  Severity is moderate.  Duration of 7 months.          Comments    7 Month AMD F/U  Pt sts VA is not as clear as it was OS. Pt denies any other new symptoms.       Last edited by Rockie Neighbours, [place] on 04/30/2020 10:18 AM. (History)      Referring physician: Lucia Gaskins, MD [place],  [place] [address]  HISTORICAL INFORMATION:   Selected notes from the MEDICAL RECORD NUMBER    Lab Results  Component Value Date   HGBA1C 5.7 (H) 10/27/2019     CURRENT MEDICATIONS: No current outpatient medications on file. (Ophthalmic Drugs)   No current facility-administered medications for this visit. (Ophthalmic Drugs)   Current Outpatient Medications (Other)  Medication Sig  . acetaminophen (TYLENOL) 650 MG CR tablet Take 650 mg by mouth every 8 (eight) hours as needed for pain.  Marland Kitchen ALPRAZolam (XANAX) 0.25 MG tablet Take 0.125-0.25 mg by mouth at bedtime as needed for anxiety.   . Ascorbic Acid (VITAMIN C) 500 MG CAPS Take 500 mg by mouth daily.  Marland Kitchen atorvastatin (LIPITOR) 40 MG tablet TAKE ONE TABLET BY MOUTH ONCE DAILY.  . baclofen (LIORESAL) 10 MG tablet Take 10 mg by mouth as needed for muscle spasms.  . carvedilol (COREG) 6.25 MG tablet Take 6.25 mg by mouth 2 (two) times daily with a meal.  . docusate sodium (COLACE) 100 MG capsule Take 100 mg by mouth at bedtime.   . donepezil (ARICEPT) 10 MG tablet TAKE ONE TABLET BY MOUTH ONCE DAILY.  . ergocalciferol (VITAMIN D2) 50000 UNITS capsule Take 50,000 Units by mouth every Tuesday.   . ferrous sulfate 325 (65 FE) MG EC tablet Take 325 mg by mouth daily.   . furosemide (LASIX) 40 MG tablet TAKE ONE TABLET BY MOUTH ONCE DAILY.  Marland Kitchen  HYDROcodone-acetaminophen (NORCO) 7.5-325 MG tablet Take 1 tablet by mouth every 6 (six) hours as needed for moderate pain.  . hydrocortisone (ANUSOL-HC) 2.5 % rectal cream Place 1 application rectally 3 (three) times daily as needed for hemorrhoids or anal itching.  . levalbuterol (XOPENEX) 0.63 MG/3ML nebulizer solution Take 3 mLs (0.63 mg total) by nebulization every 4 (four) hours as needed for wheezing or shortness of breath.  . meclizine (ANTIVERT) 25 MG tablet Take 25 mg by mouth 3 (three) times daily as needed for dizziness.  . Melatonin 3 MG TABS Take 1 tablet by mouth at bedtime.  . methylPREDNISolone (MEDROL) 4 MG tablet Take 4 mg by mouth 2 (two) times daily as needed.  . multivitamin-lutein (OCUVITE-LUTEIN) CAPS capsule Take 1 capsule by mouth daily.  . ondansetron (ZOFRAN) 4 MG tablet Take 4 mg by mouth every 8 (eight) hours as needed for nausea or vomiting.  . polyethylene glycol (MIRALAX / GLYCOLAX) 17 g packet Take 17 g by mouth daily as needed for moderate constipation.  . pregabalin (LYRICA) 75 MG capsule TAKE 1 CAPSULE BY MOUTH EVERY OTHER DAY.  Marland Kitchen spironolactone (ALDACTONE) 25 MG tablet TAKE (1/2) TABLET BY MOUTH DAILY. (Patient taking differently: Take 12.5 mg by  mouth daily. )  . traMADol (ULTRAM) 50 MG tablet Take 50 mg by mouth every 6 (six) hours as needed for moderate pain.  . traZODone (DESYREL) 50 MG tablet Take 0.5-1 tablets (25-50 mg total) by mouth at bedtime as needed for sleep.  Marland Kitchen umeclidinium-vilanterol (ANORO ELLIPTA) 62.5-25 MCG/INH AEPB Inhale 1 puff into the lungs daily.   Marland Kitchen warfarin (COUMADIN) 5 MG tablet TAKE 1/2 TO 1 TABLET ONCE DAILY AS DIRECTED BY COUMADIN CLINIC.  Marland Kitchen zinc sulfate 50 MG CAPS capsule Take 50 mg by mouth 2 (two) times daily.    No current facility-administered medications for this visit. (Other)      REVIEW OF SYSTEMS:    ALLERGIES Allergies  Allergen Reactions  . Codeine Nausea And Vomiting  . Penicillins Other (See Comments)     Unknown reaction - listed on MAR Has patient had a PCN reaction causing immediate rash, facial/tongue/throat swelling, SOB or lightheadedness with hypotension: Unknown Has patient had a PCN reaction causing severe rash involving mucus membranes or skin necrosis: Unknown Has patient had a PCN reaction that required hospitalization: Unknown Has patient had a PCN reaction occurring within the last 10 years: Unknown If all of the above answers are "NO", then may proceed with Cephalosporin use.    PAST MEDICAL HISTORY Past Medical History:  Diagnosis Date  . Anemia   . Anxiety   . Arthritis   . Blood transfusion without reported diagnosis   . Carotid artery stenosis    Without infarction  . Cataract   . Chronic combined systolic (congestive) and diastolic (congestive) heart failure (HCC)   . COPD (chronic obstructive pulmonary disease) (HCC)   . Coronary artery disease   . Heart valve replaced by other means   . Hypercholesterolemia    Pure  . Hypertension    Unspecified  . LBBB (left bundle branch block)   . Macular degeneration (senile) of retina, unspecified   . On home O2   . Postsurgical aortocoronary bypass status   . Stroke (HCC)   . Transient global amnesia   . Unspecified hereditary and idiopathic peripheral neuropathy   . Unspecified vitamin D deficiency    Past Surgical History:  Procedure Laterality Date  . AORTIC VALVE REPLACEMENT    . APPENDECTOMY    . CORONARY ARTERY BYPASS GRAFT    . HIP PINNING,CANNULATED Right 07/18/2018   Procedure: RIGHT CANNULATED HIP PINNING;  Surgeon: Myrene Galas, MD;  Location: [facility] OR;  Service: Orthopedics;  Laterality: Right;  . TONSILLECTOMY      FAMILY HISTORY Family History  Problem Relation Age of Onset  . Heart attack Father   . Heart disease Father   . Stroke Father   . Stroke Sister   . Hypertension Mother   . Stroke Mother   . Neuropathy Brother   . COPD Sister     SOCIAL HISTORY Social History   Tobacco Use    . Smoking status: Former Smoker    Packs/day: 0.50    Types: Cigarettes    Quit date: 11/30/1991    Years since quitting: 28.4  . Smokeless tobacco: Never Used  . Tobacco comment: Tobacco use-no  Substance Use Topics  . Alcohol use: No  . Drug use: No         OPHTHALMIC EXAM: Base Eye Exam    Visual Acuity (ETDRS)      Right Left   Dist cc 20/70ecc +1 CF at 1'   Dist ph cc 20/60ecc NI   Correction:  Glasses       Tonometry (Tonopen, 10:23 AM)      Right Left   Pressure 10 12       Pupils      Pupils Dark Light Shape React APD   Right PERRL 2 1 Round Sluggish None   Left PERRL 2 1 Round Sluggish None       Visual Fields (Counting fingers)      Left Right    Full Full       Extraocular Movement      Right Left    Full Full       Neuro/Psych    Oriented x3: Yes   Mood/Affect: Normal       Dilation    Both eyes: 1.0% Mydriacyl, 2.5% Phenylephrine @ 10:23 AM          IMAGING AND PROCEDURES  Imaging and Procedures for 04/30/20           ASSESSMENT/PLAN:  No problem-specific Assessment & Plan notes found for this encounter.      ICD-10-CM   1. Exudative age-related macular degeneration of left eye with active choroidal neovascularization (HCC)  H35.3221 OCT, Retina - OU - Both Eyes  2. 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 Serous detachment of retinal pigment epithelium of right eye  H35.721     1.  Repeat Avastin OS today goal is to limit growth of scotoma OS and salvage current function  2.  D, visual acuity change from dry ARMD progression  3.  Ophthalmic Meds Ordered this visit:  No orders of the defined types were placed in this encounter.      No follow-ups on file.  There are no Patient Instructions on file for this visit.   Explained the diagnoses, plan, and follow up with the patient and they expressed understanding.  Patient expressed  understanding of the importance of proper follow up care.   Alford Highland Traci Plemons M.D. Diseases & Surgery of the Retina and Vitreous [facility] 04/30/20     Abbreviations: M myopia (nearsighted); A astigmatism; H hyperopia (farsighted); P presbyopia; Mrx spectacle prescription;  CTL contact lenses; OD right eye; OS left eye; OU both eyes  XT exotropia; ET esotropia; PEK punctate epithelial keratitis; PEE punctate epithelial erosions; DES dry eye syndrome; MGD meibomian gland dysfunction; ATs artificial tears; PFAT's preservative free artificial tears; NSC nuclear sclerotic cataract; PSC posterior subcapsular cataract; ERM epi-retinal membrane; PVD posterior vitreous detachment; RD retinal detachment; DM diabetes mellitus; DR diabetic retinopathy; NPDR non-proliferative diabetic retinopathy; PDR proliferative diabetic retinopathy; CSME clinically significant macular edema; DME diabetic macular edema; dbh dot blot hemorrhages; CWS cotton wool spot; POAG primary open angle glaucoma; C/D cup-to-disc ratio; HVF humphrey visual field; GVF goldmann visual field; OCT optical coherence tomography; IOP intraocular pressure; BRVO Branch retinal vein occlusion; CRVO central retinal vein occlusion; CRAO central retinal artery occlusion; BRAO branch retinal artery occlusion; RT retinal tear; SB scleral buckle; PPV pars plana vitrectomy; VH Vitreous hemorrhage; PRP panretinal laser photocoagulation; IVK intravitreal kenalog; VMT vitreomacular traction; MH Macular hole;  NVD neovascularization of the disc; NVE neovascularization elsewhere; AREDS age related eye disease study; ARMD age related macular degeneration; POAG primary open angle glaucoma; EBMD epithelial/anterior basement membrane dystrophy; ACIOL anterior chamber intraocular lens; IOL intraocular lens; PCIOL posterior chamber intraocular lens; Phaco/IOL phacoemulsification with intraocular lens placement; PRK photorefractive keratectomy; LASIK laser  assisted in situ keratomileusis; HTN hypertension; DM diabetes  mellitus; COPD chronic obstructive pulmonary disease

## 2020-05-08 ENCOUNTER — Ambulatory Visit (INDEPENDENT_AMBULATORY_CARE_PROVIDER_SITE_OTHER): Payer: Medicare Other | Admitting: Internal Medicine

## 2020-05-08 DIAGNOSIS — I6350 Cerebral infarction due to unspecified occlusion or stenosis of unspecified cerebral artery: Secondary | ICD-10-CM | POA: Diagnosis not present

## 2020-05-08 DIAGNOSIS — Z5181 Encounter for therapeutic drug level monitoring: Secondary | ICD-10-CM

## 2020-05-08 DIAGNOSIS — I359 Nonrheumatic aortic valve disorder, unspecified: Secondary | ICD-10-CM | POA: Diagnosis not present

## 2020-05-08 DIAGNOSIS — Z952 Presence of prosthetic heart valve: Secondary | ICD-10-CM

## 2020-05-08 LAB — POCT INR: INR: 2 (ref 2.0–3.0)

## 2020-05-22 ENCOUNTER — Ambulatory Visit (INDEPENDENT_AMBULATORY_CARE_PROVIDER_SITE_OTHER): Payer: Medicare Other | Admitting: Cardiology

## 2020-05-22 DIAGNOSIS — I6350 Cerebral infarction due to unspecified occlusion or stenosis of unspecified cerebral artery: Secondary | ICD-10-CM

## 2020-05-22 DIAGNOSIS — Z952 Presence of prosthetic heart valve: Secondary | ICD-10-CM | POA: Diagnosis not present

## 2020-05-22 DIAGNOSIS — Z5181 Encounter for therapeutic drug level monitoring: Secondary | ICD-10-CM

## 2020-05-22 DIAGNOSIS — I359 Nonrheumatic aortic valve disorder, unspecified: Secondary | ICD-10-CM

## 2020-05-22 LAB — POCT INR: INR: 2.1 (ref 2.0–3.0)

## 2020-05-26 ENCOUNTER — Telehealth: Payer: Self-pay | Admitting: Cardiology

## 2020-05-26 NOTE — Telephone Encounter (Signed)
[name] is calling stating she is returning a phone call she received today from our office. I was unable to find any documentation as to what this call was in regards to. Please advise.

## 2020-05-26 NOTE — Telephone Encounter (Signed)
Spoke with pt daughter, do not know who called. Checked with the [facility] clinic and they have not called either.

## 2020-05-27 ENCOUNTER — Other Ambulatory Visit: Payer: Self-pay

## 2020-05-27 ENCOUNTER — Other Ambulatory Visit: Payer: Medicare Other

## 2020-05-27 NOTE — Progress Notes (Signed)
PATIENT NAME: [name] DOB: 05/30/1926 MRN: [id number]  PRIMARY CARE PROVIDER: Lucia Gaskins, MD  RESPONSIBLE PARTY:  Acct ID - Guarantor Home Phone Work Phone Relationship Acct Type  [id number] [name] [phone]  Self P/F     [name], [place], [place] [address]    PLAN OF CARE and INTERVENTIONS:               1.  GOALS OF CARE/ ADVANCE CARE PLANNING:  Remain at home with 24/7 caregiver and function at optimal level.                2.  PATIENT/CAREGIVER EDUCATION:  Education on need to weight self daily, education on s/s of infection, reviewed meds, support               3.  DISEASE STATUS: RN made scheduled palliative care home visit. RN met with patient and hired caregiver [name]. Patient sitting in recliner chair reading a book. Patient reports she and caregiver went out to lunch earlier today. Patient denies having pain at the present time. Patient reports she is going to see Dr Cindie Laroche tomorrow. Patient reports she has had some intermittent pain in her left ear. Patient reports her current weight is 110 lbs. Patient states she has been weighing herself daily. Education to patient on the importance to continuing self daily to monitor fluid overload. Patient does not have oxygen in place at the present time. Patient states she takes oxygen off at times when she is sitting as it makes her nose sore. Patients oxygen at 4 liters per minute and patient wears oxygen during the night. Patients breath sounds are clear and patient denies having any cough. Patient reports shortness of breath has not been too bad on exertion. Patient reports her appetite is good. Patient states she has been going to the gym 3 times per week. Patient states she has been sleeping fairly well but does wake up through the night. Patient does not nap a lot during the day per caregiver. Patients vital signs are stable. Daughter does anticoagulant checks as directed by MD's office.  Nurse reviewed medications  with patient and caregiver. Patient remains in agreement with palliative care services. Patient and caregiver encouraged to contact palliative care with questions or concerns.      HISTORY OF PRESENT ILLNESS: Patient is a 86 year old female who resides in her home with 24/7 caregiver.  Patient is followed by palliative care and is seen monthly and PRN.       CODE STATUS: DNR  ADVANCED DIRECTIVES: N MOST FORM: No PPS: 60%   PHYSICAL EXAM:   VITALS: Today's Vitals   05/27/20 1420  BP: (!) 118/42  Pulse: (!) 58  Resp: 16  Temp: 98.5 F (36.9 C)  TempSrc: Temporal  SpO2: 93%  Weight: 110 lb (49.9 kg)  PainSc: 0-No pain    LUNGS: clear to auscultation CARDIAC: Cor Brady  EXTREMITIES: Trace edema SKIN: Skin color, texture, turgor normal. No rashes or lesions  NEURO: positive for weakness       [name], RN

## 2020-05-28 DIAGNOSIS — D508 Other iron deficiency anemias: Secondary | ICD-10-CM | POA: Diagnosis not present

## 2020-05-28 DIAGNOSIS — G609 Hereditary and idiopathic neuropathy, unspecified: Secondary | ICD-10-CM | POA: Diagnosis not present

## 2020-05-28 DIAGNOSIS — E7849 Other hyperlipidemia: Secondary | ICD-10-CM | POA: Diagnosis not present

## 2020-05-28 DIAGNOSIS — G309 Alzheimer's disease, unspecified: Secondary | ICD-10-CM | POA: Diagnosis not present

## 2020-06-04 ENCOUNTER — Other Ambulatory Visit: Payer: Self-pay

## 2020-06-04 ENCOUNTER — Ambulatory Visit (INDEPENDENT_AMBULATORY_CARE_PROVIDER_SITE_OTHER): Payer: Medicare Other | Admitting: Ophthalmology

## 2020-06-04 ENCOUNTER — Encounter (INDEPENDENT_AMBULATORY_CARE_PROVIDER_SITE_OTHER): Payer: Self-pay | Admitting: Ophthalmology

## 2020-06-04 DIAGNOSIS — H353221 Exudative age-related macular degeneration, left eye, with active choroidal neovascularization: Secondary | ICD-10-CM

## 2020-06-04 MED ORDER — BEVACIZUMAB CHEMO INJECTION 1.25MG/0.05ML SYRINGE FOR KALEIDOSCOPE
1.2500 mg | INTRAVITREAL | Status: AC | PRN
  Administered 2020-06-04: 1.25 mg via INTRAVITREAL

## 2020-06-04 NOTE — Assessment & Plan Note (Addendum)
The nature of wet macular degeneration was discussed with the patient.  Forms of therapy reviewed include the use of Anti-VEGF medications injected painlessly into the eye, as well as other possible treatment modalities, including thermal laser therapy. Fellow eye involvement and risks were discussed with the patient. Upon the finding of wet age related macular degeneration, treatment will be offered. The treatment regimen is on a treat as needed basis with the intent to treat if necessary and extend interval of exams when possible. On average 1 out of 6 patients do not need lifetime therapy. However, the risk of recurrent disease is high for a lifetime.  Initially monthly, then periodic, examinations and evaluations will determine whether the next treatment is required on the day of the examination.  OS, improved status post intravitreal Avastin some 5 weeks previous, still active disease with clinical subretinal hemorrhage as well as fluid and CME on OCT.    Repeat injection today and examination in 5 weeks

## 2020-06-04 NOTE — Progress Notes (Signed)
06/04/2020     CHIEF COMPLAINT Patient presents for Retina Follow Up   HISTORY OF PRESENT ILLNESS: [name] is a 86 y.o. female who presents to the clinic today for:   HPI    Retina Follow Up    Patient presents with  Wet AMD.  In left eye.  This started 5 weeks ago.  Duration of 5 weeks.  Since onset it is stable.          Comments    5 week f/u dilated exam, OCT(MAC) and possible Avastin OS today.  Pt denies noticeable changes to [place] OU since last visit. Pt denies ocular pain, flashes of light, or floaters OU.         Last edited by Melburn Popper, COA on 06/04/2020 10:43 AM. (History)      Referring physician: Lucia Gaskins, MD [place],  [place] [address]  HISTORICAL INFORMATION:   Selected notes from the MEDICAL RECORD NUMBER    Lab Results  Component Value Date   HGBA1C 5.7 (H) 10/27/2019     CURRENT MEDICATIONS: No current outpatient medications on file. (Ophthalmic Drugs)   No current facility-administered medications for this visit. (Ophthalmic Drugs)   Current Outpatient Medications (Other)  Medication Sig  . acetaminophen (TYLENOL) 650 MG CR tablet Take 650 mg by mouth every 8 (eight) hours as needed for pain.  Marland Kitchen ALPRAZolam (XANAX) 0.25 MG tablet Take 0.125-0.25 mg by mouth at bedtime as needed for anxiety.   . Ascorbic Acid (VITAMIN C) 500 MG CAPS Take 500 mg by mouth daily.  Marland Kitchen atorvastatin (LIPITOR) 40 MG tablet TAKE ONE TABLET BY MOUTH ONCE DAILY.  . baclofen (LIORESAL) 10 MG tablet Take 10 mg by mouth as needed for muscle spasms.  . carvedilol (COREG) 6.25 MG tablet Take 6.25 mg by mouth 2 (two) times daily with a meal.  . docusate sodium (COLACE) 100 MG capsule Take 100 mg by mouth at bedtime.   . donepezil (ARICEPT) 10 MG tablet TAKE ONE TABLET BY MOUTH ONCE DAILY.  . ergocalciferol (VITAMIN D2) 50000 UNITS capsule Take 50,000 Units by mouth every Tuesday.   . ferrous sulfate 325 (65 FE) MG EC tablet Take 325 mg by  mouth daily.   . furosemide (LASIX) 40 MG tablet TAKE ONE TABLET BY MOUTH ONCE DAILY.  Marland Kitchen HYDROcodone-acetaminophen (NORCO) 7.5-325 MG tablet Take 1 tablet by mouth every 6 (six) hours as needed for moderate pain.  . hydrocortisone (ANUSOL-HC) 2.5 % rectal cream Place 1 application rectally 3 (three) times daily as needed for hemorrhoids or anal itching.  . levalbuterol (XOPENEX) 0.63 MG/3ML nebulizer solution Take 3 mLs (0.63 mg total) by nebulization every 4 (four) hours as needed for wheezing or shortness of breath.  . meclizine (ANTIVERT) 25 MG tablet Take 25 mg by mouth 3 (three) times daily as needed for dizziness.  . Melatonin 3 MG TABS Take 1 tablet by mouth at bedtime.  . methylPREDNISolone (MEDROL) 4 MG tablet Take 4 mg by mouth 2 (two) times daily as needed.  . multivitamin-lutein (OCUVITE-LUTEIN) CAPS capsule Take 1 capsule by mouth daily.  . ondansetron (ZOFRAN) 4 MG tablet Take 4 mg by mouth every 8 (eight) hours as needed for nausea or vomiting.  . polyethylene glycol (MIRALAX / GLYCOLAX) 17 g packet Take 17 g by mouth daily as needed for moderate constipation.  . pregabalin (LYRICA) 75 MG capsule TAKE 1 CAPSULE BY MOUTH EVERY OTHER DAY.  Marland Kitchen spironolactone (ALDACTONE) 25  MG tablet TAKE (1/2) TABLET BY MOUTH DAILY. (Patient taking differently: Take 12.5 mg by mouth daily. )  . traMADol (ULTRAM) 50 MG tablet Take 50 mg by mouth every 6 (six) hours as needed for moderate pain.  . traZODone (DESYREL) 50 MG tablet Take 0.5-1 tablets (25-50 mg total) by mouth at bedtime as needed for sleep.  Marland Kitchen umeclidinium-vilanterol (ANORO ELLIPTA) 62.5-25 MCG/INH AEPB Inhale 1 puff into the lungs daily.   Marland Kitchen warfarin (COUMADIN) 5 MG tablet TAKE 1/2 TO 1 TABLET ONCE DAILY AS DIRECTED BY COUMADIN CLINIC.  Marland Kitchen zinc sulfate 50 MG CAPS capsule Take 50 mg by mouth 2 (two) times daily.    No current facility-administered medications for this visit. (Other)      REVIEW OF SYSTEMS:    ALLERGIES Allergies    Allergen Reactions  . Codeine Nausea And Vomiting  . Penicillins Other (See Comments)    Unknown reaction - listed on MAR Has patient had a PCN reaction causing immediate rash, facial/tongue/throat swelling, SOB or lightheadedness with hypotension: Unknown Has patient had a PCN reaction causing severe rash involving mucus membranes or skin necrosis: Unknown Has patient had a PCN reaction that required hospitalization: Unknown Has patient had a PCN reaction occurring within the last 10 years: Unknown If all of the above answers are "NO", then may proceed with Cephalosporin use.    PAST MEDICAL HISTORY Past Medical History:  Diagnosis Date  . Anemia   . Anxiety   . Arthritis   . Blood transfusion without reported diagnosis   . Carotid artery stenosis    Without infarction  . Cataract   . Chronic combined systolic (congestive) and diastolic (congestive) heart failure ([place])   . COPD (chronic obstructive pulmonary disease) ([place])   . Coronary artery disease   . Heart valve replaced by other means   . Hypercholesterolemia    Pure  . Hypertension    Unspecified  . LBBB (left bundle branch block)   . Macular degeneration (senile) of retina, unspecified   . On home O2   . Postsurgical aortocoronary bypass status   . Stroke ([place])   . Transient global amnesia   . Unspecified hereditary and idiopathic peripheral neuropathy   . Unspecified vitamin D deficiency    Past Surgical History:  Procedure Laterality Date  . AORTIC VALVE REPLACEMENT    . APPENDECTOMY    . CORONARY ARTERY BYPASS GRAFT    . HIP PINNING,CANNULATED Right 07/18/2018   Procedure: RIGHT CANNULATED HIP PINNING;  Surgeon: Altamese Lansford, MD;  Location: [facility];  Service: Orthopedics;  Laterality: Right;  . TONSILLECTOMY      FAMILY HISTORY Family History  Problem Relation Age of Onset  . Heart attack Father   . Heart disease Father   . Stroke Father   . Stroke Sister   . Hypertension Mother   . Stroke Mother    . Neuropathy Brother   . COPD Sister     SOCIAL HISTORY Social History   Tobacco Use  . Smoking status: Former Smoker    Packs/day: 0.50    Types: Cigarettes    Quit date: 11/30/1991    Years since quitting: 28.5  . Smokeless tobacco: Never Used  . Tobacco comment: Tobacco use-no  Substance Use Topics  . Alcohol use: No  . Drug use: No         OPHTHALMIC EXAM:  Base Eye Exam    Visual Acuity (ETDRS)      Right Left   Dist  cc 20/80 -1 CF at 1'   Dist ph cc 20/40 -1    Correction: Glasses       Tonometry (Tonopen, 10:48 AM)      Right Left   Pressure 9 12       Pupils      Pupils Dark Light Shape React APD   Right PERRL 2 1 Round Slow None   Left PERRL 2 1 Round Slow None       Visual Fields (Counting fingers)      Left Right    Full Full       Extraocular Movement      Right Left    Full Full       Neuro/Psych    Oriented x3: Yes   Mood/Affect: Normal       Dilation    Left eye: 1.0% Mydriacyl, 2.5% Phenylephrine @ 10:48 AM        Slit Lamp and Fundus Exam    External Exam      Right Left   External Normal Normal       Slit Lamp Exam      Right Left   Lids/Lashes Normal Normal   Conjunctiva/Sclera White and quiet White and quiet   Cornea Clear Clear   Anterior Chamber Deep and quiet Deep and quiet   Iris Round and reactive Round and reactive   Lens Posterior chamber intraocular lens Posterior chamber intraocular lens   Anterior Vitreous Normal Normal       Fundus Exam      Right Left   Posterior Vitreous  Posterior vitreous detachment   Disc  Normal   C/D Ratio 0.2 0.3   Macula Normal Subretinal hemorrhage, Macular thickening   Vessels  Normal   Periphery Normal Normal          IMAGING AND PROCEDURES  Imaging and Procedures for 06/04/20  OCT, Retina - OU - Both Eyes       Right Eye Quality was good. Scan locations included subfoveal. Central Foveal Thickness: 249. Progression has been stable. Findings include  retinal drusen .   Left Eye Quality was good. Scan locations included subfoveal. Central Foveal Thickness: 393. Progression has improved. Findings include retinal drusen , cystoid macular edema, disciform scar.        Intravitreal Injection, Pharmacologic Agent - OS - Left Eye       Time Out 06/04/2020. 11:28 AM. Confirmed correct patient, procedure, site, and patient consented.   Anesthesia Topical anesthesia was used. Anesthetic medications included [place] 3.5%.   Procedure Preparation included Tobramycin 0.3%, 10% betadine to eyelids, 5% betadine to ocular surface. A 30 gauge needle was used.   Injection:  1.25 mg Bevacizumab (AVASTIN) SOLN   NDC: [address]-6   Route: Intravitreal, Site: Left Eye, Waste: 0 mg  Post-op Post injection exam found visual acuity of at least counting fingers. The patient tolerated the procedure well. There were no complications. The patient received written and verbal post procedure care education. Post injection medications were not given.                 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  Forms of therapy reviewed include the use of Anti-VEGF medications injected painlessly into the eye, as well as other possible treatment modalities, including thermal laser therapy. Fellow eye involvement and risks were discussed with the patient. Upon the  finding of wet age related macular degeneration, treatment will be offered. The treatment regimen is on a treat as needed basis with the intent to treat if necessary and extend interval of exams when possible. On average 1 out of 6 patients do not need lifetime therapy. However, the risk of recurrent disease is high for a lifetime.  Initially monthly, then periodic, examinations and evaluations will determine whether the next treatment is required on the  day of the examination.  OS, improved status post intravitreal Avastin some 5 weeks previous, still active disease with clinical subretinal hemorrhage as well as fluid and CME on OCT.    Repeat injection today and examination in 5 weeks      ICD-10-CM   1. Exudative age-related macular degeneration of left eye with active choroidal neovascularization (HCC)  H35.3221 OCT, Retina - OU - Both Eyes    Intravitreal Injection, Pharmacologic Agent - OS - Left Eye    Bevacizumab (AVASTIN) SOLN 1.25 mg    1.OS, improved status post intravitreal Avastin some 5 weeks previous, still active disease with clinical subretinal hemorrhage as well as fluid and CME on OCT.    Repeat injection today and examination in 5 weeks  2.  3.  Ophthalmic Meds Ordered this visit:  Meds ordered this encounter  Medications  . Bevacizumab (AVASTIN) SOLN 1.25 mg       Return in about 5 weeks (around 07/09/2020) for dilate, OS, AVASTIN OCT.  There are no Patient Instructions on file for this visit.   Explained the diagnoses, plan, and follow up with the patient and they expressed understanding.  Patient expressed understanding of the importance of proper follow up care.   Clent Demark Ellanora Rayborn M.D. Diseases & Surgery of the Retina and Vitreous Retina & Diabetic [place] 06/04/20     Abbreviations: M myopia (nearsighted); A astigmatism; H hyperopia (farsighted); P presbyopia; Mrx spectacle prescription;  CTL contact lenses; OD right eye; OS left eye; OU both eyes  XT exotropia; ET esotropia; PEK punctate epithelial keratitis; PEE punctate epithelial erosions; DES dry eye syndrome; MGD meibomian gland dysfunction; ATs artificial tears; PFAT's preservative free artificial tears; [place] nuclear sclerotic cataract; PSC posterior subcapsular cataract; ERM epi-retinal membrane; PVD posterior vitreous detachment; RD retinal detachment; DM diabetes mellitus; DR diabetic retinopathy; NPDR non-proliferative diabetic retinopathy;  PDR proliferative diabetic retinopathy; CSME clinically significant macular edema; DME diabetic macular edema; dbh dot blot hemorrhages; CWS cotton wool spot; POAG primary open angle glaucoma; C/D cup-to-disc ratio; HVF humphrey visual field; GVF goldmann visual field; OCT optical coherence tomography; IOP intraocular pressure; BRVO Branch retinal vein occlusion; CRVO central retinal vein occlusion; CRAO central retinal artery occlusion; BRAO branch retinal artery occlusion; RT retinal tear; SB scleral buckle; PPV pars plana vitrectomy; VH Vitreous hemorrhage; PRP panretinal laser photocoagulation; IVK intravitreal kenalog; VMT vitreomacular traction; MH Macular hole;  NVD neovascularization of the disc; NVE neovascularization elsewhere; AREDS age related eye disease study; ARMD age related macular degeneration; POAG primary open angle glaucoma; EBMD epithelial/anterior basement membrane dystrophy; ACIOL anterior chamber intraocular lens; IOL intraocular lens; PCIOL posterior chamber intraocular lens; Phaco/IOL phacoemulsification with intraocular lens placement; [place] photorefractive keratectomy; LASIK laser assisted in situ keratomileusis; HTN hypertension; DM diabetes mellitus; COPD chronic obstructive pulmonary disease

## 2020-06-05 ENCOUNTER — Ambulatory Visit (INDEPENDENT_AMBULATORY_CARE_PROVIDER_SITE_OTHER): Payer: Medicare Other | Admitting: Cardiology

## 2020-06-05 DIAGNOSIS — Z5181 Encounter for therapeutic drug level monitoring: Secondary | ICD-10-CM

## 2020-06-05 DIAGNOSIS — I359 Nonrheumatic aortic valve disorder, unspecified: Secondary | ICD-10-CM

## 2020-06-05 DIAGNOSIS — Z952 Presence of prosthetic heart valve: Secondary | ICD-10-CM

## 2020-06-05 DIAGNOSIS — I6350 Cerebral infarction due to unspecified occlusion or stenosis of unspecified cerebral artery: Secondary | ICD-10-CM | POA: Diagnosis not present

## 2020-06-05 LAB — POCT INR: INR: 5 — AB (ref 2.0–3.0)

## 2020-06-05 NOTE — Progress Notes (Signed)
HPI: FU coronary artery disease (s/p CABG), prior aortic valve replacement and aortic insufficiency. Last cardiac catheterization in 2011 revealed normal left main; 30% first diagonal, 50% LAD and 60% second diagonal. There was a 40-50% circumflex and a 40% right coronary artery. All grafts were occluded. Medical therapy recommended. Transesophageal echocardiogram in February of 2012 showed an ejection fraction of 45%. There was a [employer] Jude aortic valve with moderate perivalvular aortic insufficiency. There was moderate mitral regurgitation, moderate left atrial enlargement, mild right atrial enlargement and mild tricuspid regurgitation.Last echoMarch 2020 showed ejection fraction 25 to 30%, mild left ventricular enlargement, biatrial enlargement, moderate mitral regurgitation, moderate aortic insufficiency status post aortic valve replacement with mean gradient 15 mmHg.Carotid dopplers August 2020 showed 40 to 59% right and 1 to 39% left stenosis.Had Covid 12/20. Admitted 3/21 with CHF and improved with diuresis. Since she was last seen,she denies increased dyspnea, chest pain, palpitations or syncope.  No bleeding.  Some instability with gait.  Current Outpatient Medications  Medication Sig Dispense Refill  . acetaminophen (TYLENOL) 650 MG CR tablet Take 650 mg by mouth every 8 (eight) hours as needed for pain.    Marland Kitchen ALPRAZolam (XANAX) 0.25 MG tablet Take 0.125-0.25 mg by mouth at bedtime as needed for anxiety.     . Ascorbic Acid (VITAMIN C) 500 MG CAPS Take 500 mg by mouth daily.    Marland Kitchen atorvastatin (LIPITOR) 40 MG tablet TAKE ONE TABLET BY MOUTH ONCE DAILY. 90 tablet 3  . baclofen (LIORESAL) 10 MG tablet Take 10 mg by mouth as needed for muscle spasms.    . carvedilol (COREG) 6.25 MG tablet Take 6.25 mg by mouth 2 (two) times daily with a meal.    . docusate sodium (COLACE) 100 MG capsule Take 100 mg by mouth at bedtime.     . donepezil (ARICEPT) 10 MG tablet TAKE ONE TABLET BY MOUTH ONCE  DAILY. 30 tablet 0  . ergocalciferol (VITAMIN D2) 50000 UNITS capsule Take 50,000 Units by mouth every Tuesday.     . ferrous sulfate 325 (65 FE) MG EC tablet Take 325 mg by mouth daily.     . furosemide (LASIX) 40 MG tablet TAKE ONE TABLET BY MOUTH ONCE DAILY. 90 tablet 3  . HYDROcodone-acetaminophen (NORCO) 7.5-325 MG tablet Take 1 tablet by mouth every 6 (six) hours as needed for moderate pain.    . hydrocortisone (ANUSOL-HC) 2.5 % rectal cream Place 1 application rectally 3 (three) times daily as needed for hemorrhoids or anal itching.    . levalbuterol (XOPENEX) 0.63 MG/3ML nebulizer solution Take 3 mLs (0.63 mg total) by nebulization every 4 (four) hours as needed for wheezing or shortness of breath. 120 mL 2  . meclizine (ANTIVERT) 25 MG tablet Take 25 mg by mouth 3 (three) times daily as needed for dizziness.    . Melatonin 3 MG TABS Take 1 tablet by mouth at bedtime.    . methylPREDNISolone (MEDROL) 4 MG tablet Take 4 mg by mouth 2 (two) times daily as needed.    . multivitamin-lutein (OCUVITE-LUTEIN) CAPS capsule Take 1 capsule by mouth daily.    . ondansetron (ZOFRAN) 4 MG tablet Take 4 mg by mouth every 8 (eight) hours as needed for nausea or vomiting.    . polyethylene glycol (MIRALAX / GLYCOLAX) 17 g packet Take 17 g by mouth daily as needed for moderate constipation.    . pregabalin (LYRICA) 75 MG capsule TAKE 1 CAPSULE BY MOUTH EVERY OTHER DAY. 15  capsule 5  . spironolactone (ALDACTONE) 25 MG tablet TAKE (1/2) TABLET BY MOUTH DAILY. (Patient taking differently: Take 12.5 mg by mouth daily. ) 45 tablet 6  . traMADol (ULTRAM) 50 MG tablet Take 50 mg by mouth every 6 (six) hours as needed for moderate pain.    . traZODone (DESYREL) 50 MG tablet Take 0.5-1 tablets (25-50 mg total) by mouth at bedtime as needed for sleep. 30 tablet 3  . umeclidinium-vilanterol (ANORO ELLIPTA) 62.5-25 MCG/INH AEPB Inhale 1 puff into the lungs daily.     Marland Kitchen warfarin (COUMADIN) 5 MG tablet TAKE 1/2 TO 1  TABLET ONCE DAILY AS DIRECTED BY COUMADIN CLINIC. 90 tablet 1  . zinc sulfate 50 MG CAPS capsule Take 50 mg by mouth 2 (two) times daily.      No current facility-administered medications for this visit.     Past Medical History:  Diagnosis Date  . Anemia   . Anxiety   . Arthritis   . Blood transfusion without reported diagnosis   . Carotid artery stenosis    Without infarction  . Cataract   . Chronic combined systolic (congestive) and diastolic (congestive) heart failure (HCC)   . COPD (chronic obstructive pulmonary disease) (HCC)   . Coronary artery disease   . Heart valve replaced by other means   . Hypercholesterolemia    Pure  . Hypertension    Unspecified  . LBBB (left bundle branch block)   . Macular degeneration (senile) of retina, unspecified   . On home O2   . Postsurgical aortocoronary bypass status   . Stroke (HCC)   . Transient global amnesia   . Unspecified hereditary and idiopathic peripheral neuropathy   . Unspecified vitamin D deficiency     Past Surgical History:  Procedure Laterality Date  . AORTIC VALVE REPLACEMENT    . APPENDECTOMY    . CORONARY ARTERY BYPASS GRAFT    . HIP PINNING,CANNULATED Right 07/18/2018   Procedure: RIGHT CANNULATED HIP PINNING;  Surgeon: Myrene Galas, MD;  Location: [facility];  Service: Orthopedics;  Laterality: Right;  . TONSILLECTOMY      Social History   Socioeconomic History  . Marital status: Widowed    Spouse name: Not on file  . Number of children: 2  . Years of education: 12th  . Highest education level: Not on file  Occupational History    Employer: RETIRED  Tobacco Use  . Smoking status: Former Smoker    Packs/day: 0.50    Types: Cigarettes    Quit date: 11/30/1991    Years since quitting: 28.5  . Smokeless tobacco: Never Used  . Tobacco comment: Tobacco use-no  Substance and Sexual Activity  . Alcohol use: No  . Drug use: No  . Sexual activity: Never  Other Topics Concern  . Not on file  Social  History Narrative   Pt lives at home alone.   Caffeine Use: very rarely   Social Determinants of Health   Financial Resource Strain: Low Risk   . Difficulty of Paying Living Expenses: Not hard at all  Food Insecurity: No Food Insecurity  . Worried About Programme researcher, broadcasting/film/video in the Last Year: Never true  . Ran Out of Food in the Last Year: Never true  Transportation Needs: No Transportation Needs  . Lack of Transportation (Medical): No  . Lack of Transportation (Non-Medical): No  Physical Activity: Insufficiently Active  . Days of Exercise per Week: 3 days  . Minutes of Exercise per Session: 10  min  Stress: No Stress Concern Present  . Feeling of Stress : Only a little  Social Connections: Moderately Integrated  . Frequency of Communication with Friends and Family: More than three times a week  . Frequency of Social Gatherings with Friends and Family: Three times a week  . Attends Religious Services: 1 to 4 times per year  . Active Member of Clubs or Organizations: Yes  . Attends Banker Meetings: 1 to 4 times per year  . Marital Status: Widowed  Intimate Partner Violence: Not At Risk  . Fear of Current or Ex-Partner: No  . Emotionally Abused: No  . Physically Abused: No  . Sexually Abused: No    Family History  Problem Relation Age of Onset  . Heart attack Father   . Heart disease Father   . Stroke Father   . Stroke Sister   . Hypertension Mother   . Stroke Mother   . Neuropathy Brother   . COPD Sister     ROS: no fevers or chills, productive cough, hemoptysis, dysphasia, odynophagia, melena, hematochezia, dysuria, hematuria, rash, seizure activity, orthopnea, PND, pedal edema, claudication. Remaining systems are negative.  Physical Exam: Well-developed well-nourished in no acute distress.  Skin is warm and dry.  HEENT is normal.  Neck is supple.  Chest is clear to auscultation with normal expansion.  Cardiovascular exam is regular rate and rhythm.   2/6 systolic and diastolic murmur left sternal border. Abdominal exam nontender or distended. No masses palpated. Extremities show no edema. neuro grossly intact  A/P  1 chronic systolic congestive heart failure-euvolemic at present; continue lasix and spironolactone.   2 prior aortic valve replacement-we will continue present dose of Coumadin and SBE prophylaxis.  Note her aspirin was discontinued previously because of thigh hematoma that developed at the time of combination Coumadin and aspirin. Plan is conservative measures given age.  3 cardiomyopathy-continue beta-blocker.  She did not tolerate ARB due to low blood pressure.  4 coronary artery disease-no chest pain.  Continue statin.  She is not on aspirin given need for Coumadin and history of thigh hematoma on combination of Coumadin and aspirin.  5 hypertension-BP controlled; continue present medical regimen.  6 hyperlipidemia-continue statin.  7 carotid artery disease-continue statin. Will not pursue fu carotid dopplers given age and overall medical condition.  [name], MD

## 2020-06-09 DIAGNOSIS — E559 Vitamin D deficiency, unspecified: Secondary | ICD-10-CM | POA: Diagnosis not present

## 2020-06-09 DIAGNOSIS — D500 Iron deficiency anemia secondary to blood loss (chronic): Secondary | ICD-10-CM | POA: Diagnosis not present

## 2020-06-09 DIAGNOSIS — D510 Vitamin B12 deficiency anemia due to intrinsic factor deficiency: Secondary | ICD-10-CM | POA: Diagnosis not present

## 2020-06-09 DIAGNOSIS — D513 Other dietary vitamin B12 deficiency anemia: Secondary | ICD-10-CM | POA: Diagnosis not present

## 2020-06-11 ENCOUNTER — Ambulatory Visit (INDEPENDENT_AMBULATORY_CARE_PROVIDER_SITE_OTHER): Payer: Medicare Other | Admitting: Cardiology

## 2020-06-11 ENCOUNTER — Other Ambulatory Visit: Payer: Self-pay

## 2020-06-11 ENCOUNTER — Encounter: Payer: Self-pay | Admitting: Cardiology

## 2020-06-11 VITALS — BP 144/62 | HR 56 | Ht 62.5 in | Wt 116.0 lb

## 2020-06-11 DIAGNOSIS — I5022 Chronic systolic (congestive) heart failure: Secondary | ICD-10-CM

## 2020-06-11 DIAGNOSIS — I6350 Cerebral infarction due to unspecified occlusion or stenosis of unspecified cerebral artery: Secondary | ICD-10-CM | POA: Diagnosis not present

## 2020-06-11 DIAGNOSIS — I2510 Atherosclerotic heart disease of native coronary artery without angina pectoris: Secondary | ICD-10-CM

## 2020-06-11 DIAGNOSIS — Z952 Presence of prosthetic heart valve: Secondary | ICD-10-CM

## 2020-06-11 NOTE — Patient Instructions (Signed)
Medication Instructions:  No Changes *If you need a refill on your cardiac medications before your next appointment, please call your pharmacy*   Lab Work: None Ordered If you have labs (blood work) drawn today and your tests are completely normal, you will receive your results only by: Marland Kitchen MyChart Message (if you have MyChart) OR . A paper copy in the mail If you have any lab test that is abnormal or we need to change your treatment, we will call you to review the results.   Testing/Procedures: None Ordered   Follow-Up: At [facility], you and your health needs are our priority.  As part of our continuing mission to provide you with exceptional heart care, we have created designated Provider Care Teams.  These Care Teams include your primary Cardiologist (physician) and Advanced Practice Providers (APPs -  Physician Assistants and Nurse Practitioners) who all work together to provide you with the care you need, when you need it.  We recommend signing up for the patient portal called "MyChart".  Sign up information is provided on this After Visit Summary.  MyChart is used to connect with patients for Virtual Visits (Telemedicine).  Patients are able to view lab/test results, encounter notes, upcoming appointments, etc.  Non-urgent messages can be sent to your provider as well.   To learn more about what you can do with MyChart, go to [email or web address].    Your next appointment:   6 month(s)  The format for your next appointment:   In Person  Provider:   [name], MD   Other Instructions

## 2020-06-12 ENCOUNTER — Ambulatory Visit (INDEPENDENT_AMBULATORY_CARE_PROVIDER_SITE_OTHER): Payer: Medicare Other | Admitting: Cardiovascular Disease

## 2020-06-12 ENCOUNTER — Encounter (INDEPENDENT_AMBULATORY_CARE_PROVIDER_SITE_OTHER): Payer: Medicare Other | Admitting: Ophthalmology

## 2020-06-12 DIAGNOSIS — Z952 Presence of prosthetic heart valve: Secondary | ICD-10-CM | POA: Diagnosis not present

## 2020-06-12 DIAGNOSIS — I6350 Cerebral infarction due to unspecified occlusion or stenosis of unspecified cerebral artery: Secondary | ICD-10-CM

## 2020-06-12 DIAGNOSIS — Z5181 Encounter for therapeutic drug level monitoring: Secondary | ICD-10-CM

## 2020-06-12 DIAGNOSIS — I359 Nonrheumatic aortic valve disorder, unspecified: Secondary | ICD-10-CM

## 2020-06-12 LAB — POCT INR: INR: 3.3 — AB (ref 2.0–3.0)

## 2020-06-16 ENCOUNTER — Telehealth: Payer: Self-pay | Admitting: Cardiology

## 2020-06-16 DIAGNOSIS — E7849 Other hyperlipidemia: Secondary | ICD-10-CM | POA: Diagnosis not present

## 2020-06-16 DIAGNOSIS — I110 Hypertensive heart disease with heart failure: Secondary | ICD-10-CM | POA: Diagnosis not present

## 2020-06-16 DIAGNOSIS — Z5181 Encounter for therapeutic drug level monitoring: Secondary | ICD-10-CM

## 2020-06-16 DIAGNOSIS — I4891 Unspecified atrial fibrillation: Secondary | ICD-10-CM | POA: Diagnosis not present

## 2020-06-16 DIAGNOSIS — Z952 Presence of prosthetic heart valve: Secondary | ICD-10-CM

## 2020-06-16 DIAGNOSIS — K219 Gastro-esophageal reflux disease without esophagitis: Secondary | ICD-10-CM | POA: Diagnosis not present

## 2020-06-16 NOTE — Telephone Encounter (Signed)
Patient's daughter calling stating the patient had lab work done at her PCP's office. She states there was no kidney function done and would like to be contacted if she does need it done.

## 2020-06-17 NOTE — Telephone Encounter (Signed)
Check bmet [name]  

## 2020-06-17 NOTE — Telephone Encounter (Signed)
Left message for patient with Dr Creshaw's recommendations.   Lab orders mailed to the pt  

## 2020-06-19 ENCOUNTER — Ambulatory Visit: Payer: Self-pay | Admitting: Pharmacist

## 2020-06-19 DIAGNOSIS — Z952 Presence of prosthetic heart valve: Secondary | ICD-10-CM | POA: Diagnosis not present

## 2020-06-19 DIAGNOSIS — Z7901 Long term (current) use of anticoagulants: Secondary | ICD-10-CM | POA: Diagnosis not present

## 2020-06-19 DIAGNOSIS — I6350 Cerebral infarction due to unspecified occlusion or stenosis of unspecified cerebral artery: Secondary | ICD-10-CM | POA: Diagnosis not present

## 2020-06-19 LAB — POCT INR: INR: 2.7 (ref 2.0–3.0)

## 2020-06-23 ENCOUNTER — Ambulatory Visit (INDEPENDENT_AMBULATORY_CARE_PROVIDER_SITE_OTHER): Payer: Medicare Other | Admitting: Internal Medicine

## 2020-06-23 ENCOUNTER — Other Ambulatory Visit: Payer: Self-pay

## 2020-06-23 ENCOUNTER — Other Ambulatory Visit: Payer: Self-pay | Admitting: Internal Medicine

## 2020-06-23 ENCOUNTER — Encounter: Payer: Self-pay | Admitting: Internal Medicine

## 2020-06-23 DIAGNOSIS — I6350 Cerebral infarction due to unspecified occlusion or stenosis of unspecified cerebral artery: Secondary | ICD-10-CM | POA: Diagnosis not present

## 2020-06-23 DIAGNOSIS — Z5181 Encounter for therapeutic drug level monitoring: Secondary | ICD-10-CM | POA: Diagnosis not present

## 2020-06-23 DIAGNOSIS — J9611 Chronic respiratory failure with hypoxia: Secondary | ICD-10-CM

## 2020-06-23 DIAGNOSIS — J9612 Chronic respiratory failure with hypercapnia: Secondary | ICD-10-CM

## 2020-06-23 DIAGNOSIS — J449 Chronic obstructive pulmonary disease, unspecified: Secondary | ICD-10-CM

## 2020-06-23 DIAGNOSIS — J441 Chronic obstructive pulmonary disease with (acute) exacerbation: Secondary | ICD-10-CM

## 2020-06-23 DIAGNOSIS — Z952 Presence of prosthetic heart valve: Secondary | ICD-10-CM | POA: Diagnosis not present

## 2020-06-23 NOTE — Assessment & Plan Note (Signed)
Placed on 02 2014 by Dr [name]  - advised 01/01/2020 :  Monitor sats with goal of > 94% at rest and keep > 90% with ex -  HC03  03/04/20   =  36  -  06/23/2020   Walked RA  approx   300 ft  @ avg pace  stopped due to end of study, no sob with sats 98% at end     As of 06/23/2020  rec continue same rx at hs but not need for 02 with ex if sats > 90% as was the case today.           Each maintenance medication was reviewed in detail including emphasizing most importantly the difference between maintenance and prns and under what circumstances the prns are to be triggered using an action plan format where appropriate.  Total time for H and P, chart review, counseling, reviewing devices/  directly observing portions of ambulatory 02 saturation study/  and generating customized AVS unique to this office visit / charting =21 min

## 2020-06-23 NOTE — Assessment & Plan Note (Signed)
Quit smoking around 1997  - maint on anoro and prn xopenex neb as of ov  06/23/2020   Pt is Group B in terms of symptom/risk and laba/lama therefore appropriate rx at this point >>>  anoro appropriate   Re duoneb qhs I spent extra time with pt today reviewing appropriate use of albuterol for prn use on exertion with the following points: 1) saba is for relief of sob that does not improve by walking a slower pace or resting but rather if the pt does not improve after trying this first. 2) If the pt is convinced, as many are, that saba helps recover from activity faster then it's easy to tell if this is the case by re-challenging : ie stop, take the inhaler, then p 5 minutes try the exact same activity (intensity of workload) that just caused the symptoms and see if they are substantially diminished or not after saba 3) if there is an activity that reproducibly causes the symptoms, try the saba 15 min before the activity on alternate days   If in fact the saba really does help, then fine to continue to use it prn but advised may need to look closer at the maintenance regimen being used to achieve better control of airways disease with exertion.

## 2020-06-23 NOTE — Patient Instructions (Addendum)
Make sure you check your oxygen saturations at highest level of activity to be sure it stays over 90% and adjust upward to maintain this level if needed but remember to turn it back to previous settings when you stop (to conserve your supply).     Please schedule a follow up visit in 6  months but call sooner if needed   

## 2020-06-23 NOTE — Progress Notes (Signed)
[name], female    DOB: [birth year],    MRN: [id number]   Brief patient profile:  86 yowf pt of [name] previously - she reports she quit smoking around 1997 s resp symptoms then  and placed on 02 around 2014 with dx of systolic chf and maint on anoro    Admit Date: 10/25/2019 Discharge date: 10/30/2019    CODE STATUS: DNR      Brief Summary: See H&P, Labs, Consult and Test reports for all details in brief, Patient is a86 y.o.femalewith PMHx of chronic systolic heart failure, CAD s/p CABG, mechanical aortic valve on anticoagulation, COPD on 3 L of oxygen at home-presented with shortness of breath-found to have acute hypoxic respiratory failure in the setting of COVID-19 pneumonia and decompensated systolic heart failure  Brief Hospital Course: Acute on chronic hypoxic Resp Failure due to Covid 19 Viral pneumonia and decompensated systolic heart failure (EF 25-30% March 2020):Stable-on just 2 L of oxygen.  Treated with Lasix along with steroids and remdesivir, inflammatory markers are slowly downtrending. Volume status is stable-on oral diuretic regimen.  Patient will complete remdesivir on 12/15-she will be discharged on a few days of tapering steroids.    COVID-19 Labs:  Recent Labs (last 2 labs)        Recent Labs    10/28/19 0505 10/29/19 0326 10/30/19 0428  DDIMER 1.48* 0.75* 0.84*  FERRITIN 1,999* 1,131* 769*  CRP 8.4* 5.5* 3.8*      Recent Labs       Lab Results  Component Value Date   SARSCOV2NAA DETECTED (A) 10/20/2019       COVID-19 Medications: Steroids: 12/11>>12/15 Remdesivir: 12/10>>  Other medications: Diuretics:Euvolemic, negative balance of 2.3 L-on oral diuretic regimen Antibiotics:Not needed as no evidence of bacterial infection Insulin: CBG stable with SSI. A1c 5.7 on 12/12.  Nausea/Vomiting:Probably due to COVID-19-continue supportive care-as needed antiemetics.  Elevated troponin: Secondary demand  ischemia-prior hospitalist Dr. Marinus Maw with cardiology-recommendations are for conservative management.  Transaminitis: Appears mild-secondary to COVID-19-has resolved by the time of discharge.  AKIon CKD stage IIIa:AKI improved-continue to monitor closely while on diuretics.  COPD-on 3 L of oxygen at home:No signs of exacerbation-continue bronchodilators  CAD s/p CABG:No anginal symptoms-see above regarding troponins  History of aortic valve replacement-mechanical valve: On Coumadin-INR supratherapeutic despite of holding Coumadin for several days-managed by pharmacy.  Spoke with pharmacy team-continue to hold Coumadin on discharge until INR is checked in 2 days.-Case management will arrange for INR check by home health RN on 12/17 subsequently dosing of Coumadin will be adjusted by the Coumadin clinic.  Dementia:Appears to be mild-continue Aricept    History of Present Illness  01/01/2020  Pulmonary/ 1st office eval/Izaiha Lo  Chief Complaint  Patient presents with  . Consult    Former Dr. [name] patient for severe COPD. Currently on 3L. Uses 4L at home. States her breathing has been ok.   Dyspnea: Baseline  goes to gym rides bike x 20 min s stopping on RA does not monitor sats Cough: none now Sleep: no resp ccs x 30 degrees HOB SABA use: neb daily "whether needs it or not "  (says doesn't think she does) rec Plan A = Automatic = Always=    Anoro one click each am  Plan B = Backup (does not replace plan A, it supplements it  - only use your levoalbuterol nebulizer up to every 4 hours if you can't catch your breath  Make sure you check your oxygen saturations at highest  level of activity to be sure it stays over 90%   Goal at rest is to keep 02 above 94% at rest for your heart   Admit date: 02/05/2020 Discharge date: 02/09/2020     Home Health: yes Equipment/Devices: HHPT, RN, 3-4L [place]  Discharge Condition: Stable CODE STATUS: DNR Diet recommendation: Heart  Healthy   Brief/Interim Summary: 86 year old female with a history of systolic and diastolic CHF, chronic respiratory failure on 4 L oxygen, hyperlipidemia, coronary artery disease, LBBB, coronary disease, CKD stage III, AVR presented with 1 to 2 days of worsening shortness of breath. The patient states that she has had intermittent shortness of breath for at least 2 weeks with worsening over the last 1 to 2 days prior to admission. She denies any fevers, chills, chest pain, nausea, vomiting, diarrhea, abdominal pain. Upon presentation, the patient was noted to be fluid overloaded with chest x-ray showing diffuse bilateral interstitial prominence and BNP 1712. She was started on IV furosemide with good clinical effect. During the am 3/25, she became more dyspneic. Repeat CXR and labs were ordered and duonebs were started.  Discharge Diagnoses:  Acute on chronic systolic and diastolic CHF -pt suppose to take 20 mg po bid at home, but she only takes it qday -01/22/2019 echo EF 25-30%, diffuse HK, moderate MR, moderate AI -Continue IV furosemide--dose increased to 40 bid when pt had resp distress 3/25 -Remains fluid overloaded with JVD -Daily weights -Accurate I's and O's--NEG3.1L -SARS-CoV2--neg -d/c home with lasix 40 mg daily  Acute onChronic respiratory failure with hypoxia and hypercarbia -usually on 4L at home -pt on 3L at time of d/c with saturation 97-98% -3/25--repeat CXR--personally reviewed--increased interstitial markings, sm pleural effusions -3/26--sob improved  CKD stage IIIb -Baseline creatinine 0.9-1.2 -Monitor with diuresis -serum creatinine 1.10 on day of d/c  COPD -Stable without wheezing -DiscontinuedIV steroids 3/24 -continuedduonebs -continuedpulmicort  Coronary artery disease status post CABG -No chest pain -Continue carvedilol -continue statin  Status post AVR -Continue warfarin with pharmacy  assistance  Hypokalemia -Repleted -Magnesium2.4  Hypertension -Blood pressure acceptable -Continue carvedilol  Cognitive impairment -Continue Aricept  Hyperlipidemia -Continue statin  LBBB -chronic  03/26/2020  f/u ov/Shyquan Stallbaumer re: COPD Group B  Chief Complaint  Patient presents with  . Follow-up    breathing doing better since hospital visit in march   Dyspnea:  Room to room /  [facility] to mailbox with therapist s 02  Cough: none  Sleeping: hob up about 30 degrees  SABA use: nebulizer q 9 pm  02: 3.5 hs/  And prn daytime   rec No change rx   06/23/2020  f/u ov/Gavriella Hearst re:  COPD Group B / 02 hs and prn Chief Complaint  Patient presents with  . Follow-up    No complaints  Dyspnea:  Gym 3 x week bike/ some days does mailbox 75 ft / flat  Cough: none  Sleeping: sleeping 30 degrees /  SABA use: neb at hs  02: 3.5 hs and uses p ex/ does not check sats during ex   No obvious day to day or daytime variability or assoc excess/ purulent sputum or mucus plugs or hemoptysis or cp or chest tightness, subjective wheeze or overt sinus or hb symptoms.   Sleeping as above without nocturnal  or early am exacerbation  of respiratory  c/o's or need for noct saba. Also denies any obvious fluctuation of symptoms with weather or environmental changes or other aggravating or alleviating factors except as outlined above   No unusual  exposure hx or h/o childhood pna/ asthma or knowledge of premature birth.  Current Allergies, Complete Past Medical History, Past Surgical History, Family History, and Social History were reviewed in [employer] record.  ROS  The following are not active complaints unless bolded Hoarseness, sore throat, dysphagia, dental problems, itching, sneezing,  nasal congestion or discharge of excess mucus or purulent secretions, ear ache,   fever, chills, sweats, unintended wt loss or wt gain, classically pleuritic or exertional cp,  orthopnea pnd or  arm/hand swelling  or leg swelling, presyncope, palpitations, abdominal pain, anorexia, nausea, vomiting, diarrhea  or change in bowel habits or change in bladder habits, change in stools or change in urine, dysuria, hematuria,  rash, arthralgias, visual complaints, headache, numbness, weakness or ataxia or problems with walking or coordination,  change in mood or  memory.        Current Meds  Medication Sig  . acetaminophen (TYLENOL) 650 MG CR tablet Take 650 mg by mouth every 8 (eight) hours as needed for pain.  Marland Kitchen ALPRAZolam (XANAX) 0.25 MG tablet Take 0.125-0.25 mg by mouth at bedtime as needed for anxiety.   . Ascorbic Acid (VITAMIN C) 500 MG CAPS Take 500 mg by mouth daily.  Marland Kitchen atorvastatin (LIPITOR) 40 MG tablet TAKE ONE TABLET BY MOUTH ONCE DAILY.  . baclofen (LIORESAL) 10 MG tablet Take 10 mg by mouth as needed for muscle spasms.  . carvedilol (COREG) 6.25 MG tablet Take 6.25 mg by mouth 2 (two) times daily with a meal.  . docusate sodium (COLACE) 100 MG capsule Take 100 mg by mouth at bedtime.   . donepezil (ARICEPT) 10 MG tablet TAKE ONE TABLET BY MOUTH ONCE DAILY.  . ergocalciferol (VITAMIN D2) 50000 UNITS capsule Take 50,000 Units by mouth every Tuesday.   . ferrous sulfate 325 (65 FE) MG EC tablet Take 325 mg by mouth daily.   . furosemide (LASIX) 40 MG tablet TAKE ONE TABLET BY MOUTH ONCE DAILY.  Marland Kitchen HYDROcodone-acetaminophen (NORCO) 7.5-325 MG tablet Take 1 tablet by mouth every 6 (six) hours as needed for moderate pain.  . hydrocortisone (ANUSOL-HC) 2.5 % rectal cream Place 1 application rectally 3 (three) times daily as needed for hemorrhoids or anal itching.  . levalbuterol (XOPENEX) 0.63 MG/3ML nebulizer solution Take 3 mLs (0.63 mg total) by nebulization every 4 (four) hours as needed for wheezing or shortness of breath.  . meclizine (ANTIVERT) 25 MG tablet Take 25 mg by mouth 3 (three) times daily as needed for dizziness.  . Melatonin 3 MG TABS Take 1 tablet by mouth at bedtime.   . methylPREDNISolone (MEDROL) 4 MG tablet Take 4 mg by mouth 2 (two) times daily as needed.  . multivitamin-lutein (OCUVITE-LUTEIN) CAPS capsule Take 1 capsule by mouth daily.  . ondansetron (ZOFRAN) 4 MG tablet Take 4 mg by mouth every 8 (eight) hours as needed for nausea or vomiting.  . polyethylene glycol (MIRALAX / GLYCOLAX) 17 g packet Take 17 g by mouth daily as needed for moderate constipation.  . pregabalin (LYRICA) 75 MG capsule TAKE 1 CAPSULE BY MOUTH EVERY OTHER DAY.  Marland Kitchen spironolactone (ALDACTONE) 25 MG tablet TAKE (1/2) TABLET BY MOUTH DAILY. (Patient taking differently: Take 12.5 mg by mouth daily. )  . traMADol (ULTRAM) 50 MG tablet Take 50 mg by mouth every 6 (six) hours as needed for moderate pain.  . traZODone (DESYREL) 50 MG tablet Take 0.5-1 tablets (25-50 mg total) by mouth at bedtime as needed for sleep.  Marland Kitchen  umeclidinium-vilanterol (ANORO ELLIPTA) 62.5-25 MCG/INH AEPB Inhale 1 puff into the lungs daily.   Marland Kitchen. warfarin (COUMADIN) 5 MG tablet TAKE 1/2 TO 1 TABLET ONCE DAILY AS DIRECTED BY COUMADIN CLINIC.  Marland Kitchen. zinc sulfate 50 MG CAPS capsule Take 50 mg by mouth 2 (two) times daily.                       Past Medical History:  Diagnosis Date  . Anemia   . Anxiety   . Arthritis   . Blood transfusion without reported diagnosis   . Carotid artery stenosis    Without infarction  . Cataract   . Chronic combined systolic (congestive) and diastolic (congestive) heart failure (HCC)   . COPD (chronic obstructive pulmonary disease) (HCC)   . Coronary artery disease   . Heart valve replaced by other means   . Hypercholesterolemia    Pure  . Hypertension    Unspecified  . LBBB (left bundle branch block)   . Macular degeneration (senile) of retina, unspecified   . On home O2   . Postsurgical aortocoronary bypass status   . Stroke (HCC)   . Transient global amnesia   . Unspecified hereditary and idiopathic peripheral neuropathy   . Unspecified vitamin D deficiency         Objective:    06/23/2020          115   03/26/20 112 lb (50.8 kg)  03/18/20 109 lb (49.4 kg)  03/03/20 111 lb 9.6 oz (50.6 kg)    Vital signs reviewed  06/23/2020  - Note at rest 02 sats  97% on RA       amb wf < state age, extremely hard of hearing   HEENT : pt wearing mask not removed for exam due to covid - 19 concerns.    NECK :  without JVD/Nodes/TM/ nl carotid upstrokes bilaterally   LUNGS: no acc muscle use,  Mild barrel  contour chest wall with bilateral  Distant bs s audible wheeze and  without cough on insp or exp maneuvers  and mild  Hyperresonant  to  percussion bilaterally     CV:  RRR  no s3 or murmur or increase in P2, and no edema   ABD:  soft and nontender with pos end  insp Hoover's  in the supine position. No bruits or organomegaly appreciated, bowel sounds nl  MS:   Nl gait/  ext warm without deformities, calf tenderness, cyanosis or clubbing No obvious joint restrictions   SKIN: warm and dry without lesions    NEURO:  alert, approp, nl sensorium with  no motor or cerebellar deficits apparent.                 Assessment

## 2020-06-24 LAB — BASIC METABOLIC PANEL
BUN/Creatinine Ratio: 15 (ref 12–28)
BUN: 17 mg/dL (ref 10–36)
CO2: 30 mmol/L — ABNORMAL HIGH (ref 20–29)
Calcium: 8.7 mg/dL (ref 8.7–10.3)
Chloride: 103 mmol/L (ref 96–106)
Creatinine, Ser: 1.13 mg/dL — ABNORMAL HIGH (ref 0.57–1.00)
GFR calc Af Amer: 49 mL/min/{1.73_m2} — ABNORMAL LOW (ref >59)
GFR calc non Af Amer: 43 mL/min/{1.73_m2} — ABNORMAL LOW (ref >59)
Glucose: 86 mg/dL (ref 65–99)
Potassium: 4.1 mmol/L (ref 3.5–5.2)
Sodium: 143 mmol/L (ref 134–144)

## 2020-07-02 ENCOUNTER — Ambulatory Visit (INDEPENDENT_AMBULATORY_CARE_PROVIDER_SITE_OTHER): Payer: Medicare Other | Admitting: Ophthalmology

## 2020-07-02 ENCOUNTER — Encounter (INDEPENDENT_AMBULATORY_CARE_PROVIDER_SITE_OTHER): Payer: Self-pay | Admitting: Ophthalmology

## 2020-07-02 ENCOUNTER — Other Ambulatory Visit: Payer: Self-pay

## 2020-07-02 DIAGNOSIS — H353221 Exudative age-related macular degeneration, left eye, with active choroidal neovascularization: Secondary | ICD-10-CM | POA: Diagnosis not present

## 2020-07-02 DIAGNOSIS — H04123 Dry eye syndrome of bilateral lacrimal glands: Secondary | ICD-10-CM | POA: Insufficient documentation

## 2020-07-02 DIAGNOSIS — H353212 Exudative age-related macular degeneration, right eye, with inactive choroidal neovascularization: Secondary | ICD-10-CM | POA: Diagnosis not present

## 2020-07-02 NOTE — Progress Notes (Signed)
07/02/2020     CHIEF COMPLAINT Patient presents for No chief complaint on file.   HISTORY OF PRESENT ILLNESS: [name] is a 86 y.o. female who presents to the clinic today for:   HPI    4 Week s\p avastin OS Pt c/o cloudy vision OD. Onset was yesterday morning. Pt states she could not see anything out of OD. Pt has used rewetting gtts and states it has helped some , soon after years, her vision did improve and is essentially normal today   Last edited by Edmon Crape, MD on 07/02/2020 11:41 AM. (History)      Referring physician: Oval Linsey, MD [address] [place],  [place] [address]  HISTORICAL INFORMATION:   Selected notes from the MEDICAL RECORD NUMBER    Lab Results  Component Value Date   HGBA1C 5.7 (H) 10/27/2019     CURRENT MEDICATIONS: No current outpatient medications on file. (Ophthalmic Drugs)   No current facility-administered medications for this visit. (Ophthalmic Drugs)   Current Outpatient Medications (Other)  Medication Sig  . acetaminophen (TYLENOL) 650 MG CR tablet Take 650 mg by mouth every 8 (eight) hours as needed for pain.  Marland Kitchen ALPRAZolam (XANAX) 0.25 MG tablet Take 0.125-0.25 mg by mouth at bedtime as needed for anxiety.   . Ascorbic Acid (VITAMIN C) 500 MG CAPS Take 500 mg by mouth daily.  Marland Kitchen atorvastatin (LIPITOR) 40 MG tablet TAKE ONE TABLET BY MOUTH ONCE DAILY.  . baclofen (LIORESAL) 10 MG tablet Take 10 mg by mouth as needed for muscle spasms.  . carvedilol (COREG) 6.25 MG tablet Take 6.25 mg by mouth 2 (two) times daily with a meal.  . docusate sodium (COLACE) 100 MG capsule Take 100 mg by mouth at bedtime.   . donepezil (ARICEPT) 10 MG tablet TAKE ONE TABLET BY MOUTH ONCE DAILY.  . ergocalciferol (VITAMIN D2) 50000 UNITS capsule Take 50,000 Units by mouth every Tuesday.   . ferrous sulfate 325 (65 FE) MG EC tablet Take 325 mg by mouth daily.   . furosemide (LASIX) 40 MG tablet TAKE ONE TABLET BY MOUTH ONCE DAILY.  Marland Kitchen  HYDROcodone-acetaminophen (NORCO) 7.5-325 MG tablet Take 1 tablet by mouth every 6 (six) hours as needed for moderate pain.  . hydrocortisone (ANUSOL-HC) 2.5 % rectal cream Place 1 application rectally 3 (three) times daily as needed for hemorrhoids or anal itching.  . levalbuterol (XOPENEX) 0.63 MG/3ML nebulizer solution USE 1 VIAL VIA NEBULIZER EVERY 8 HOURS AS NEEDED.  Marland Kitchen meclizine (ANTIVERT) 25 MG tablet Take 25 mg by mouth 3 (three) times daily as needed for dizziness.  . Melatonin 3 MG TABS Take 1 tablet by mouth at bedtime.  . methylPREDNISolone (MEDROL) 4 MG tablet Take 4 mg by mouth 2 (two) times daily as needed.  . multivitamin-lutein (OCUVITE-LUTEIN) CAPS capsule Take 1 capsule by mouth daily.  . ondansetron (ZOFRAN) 4 MG tablet Take 4 mg by mouth every 8 (eight) hours as needed for nausea or vomiting.  . polyethylene glycol (MIRALAX / GLYCOLAX) 17 g packet Take 17 g by mouth daily as needed for moderate constipation.  . pregabalin (LYRICA) 75 MG capsule TAKE 1 CAPSULE BY MOUTH EVERY OTHER DAY.  Marland Kitchen spironolactone (ALDACTONE) 25 MG tablet TAKE (1/2) TABLET BY MOUTH DAILY. (Patient taking differently: Take 12.5 mg by mouth daily. )  . traMADol (ULTRAM) 50 MG tablet Take 50 mg by mouth every 6 (six) hours as needed for moderate pain.  . traZODone (DESYREL) 50  MG tablet Take 0.5-1 tablets (25-50 mg total) by mouth at bedtime as needed for sleep.  Marland Kitchen umeclidinium-vilanterol (ANORO ELLIPTA) 62.5-25 MCG/INH AEPB Inhale 1 puff into the lungs daily.   Marland Kitchen warfarin (COUMADIN) 5 MG tablet TAKE 1/2 TO 1 TABLET ONCE DAILY AS DIRECTED BY COUMADIN CLINIC.  Marland Kitchen zinc sulfate 50 MG CAPS capsule Take 50 mg by mouth 2 (two) times daily.    No current facility-administered medications for this visit. (Other)      REVIEW OF SYSTEMS:    ALLERGIES Allergies  Allergen Reactions  . Codeine Nausea And Vomiting  . Penicillins Other (See Comments)    Unknown reaction - listed on MAR Has patient had a PCN  reaction causing immediate rash, facial/tongue/throat swelling, SOB or lightheadedness with hypotension: Unknown Has patient had a PCN reaction causing severe rash involving mucus membranes or skin necrosis: Unknown Has patient had a PCN reaction that required hospitalization: Unknown Has patient had a PCN reaction occurring within the last 10 years: Unknown If all of the above answers are "NO", then may proceed with Cephalosporin use.    PAST MEDICAL HISTORY Past Medical History:  Diagnosis Date  . Anemia   . Anxiety   . Arthritis   . Blood transfusion without reported diagnosis   . Carotid artery stenosis    Without infarction  . Cataract   . Chronic combined systolic (congestive) and diastolic (congestive) heart failure (HCC)   . COPD (chronic obstructive pulmonary disease) (HCC)   . Coronary artery disease   . Heart valve replaced by other means   . Hypercholesterolemia    Pure  . Hypertension    Unspecified  . LBBB (left bundle branch block)   . Macular degeneration (senile) of retina, unspecified   . On home O2   . Postsurgical aortocoronary bypass status   . Stroke (HCC)   . Transient global amnesia   . Unspecified hereditary and idiopathic peripheral neuropathy   . Unspecified vitamin D deficiency    Past Surgical History:  Procedure Laterality Date  . AORTIC VALVE REPLACEMENT    . APPENDECTOMY    . CORONARY ARTERY BYPASS GRAFT    . HIP PINNING,CANNULATED Right 07/18/2018   Procedure: RIGHT CANNULATED HIP PINNING;  Surgeon: Myrene Galas, MD;  Location: [facility] OR;  Service: Orthopedics;  Laterality: Right;  . TONSILLECTOMY      FAMILY HISTORY Family History  Problem Relation Age of Onset  . Heart attack Father   . Heart disease Father   . Stroke Father   . Stroke Sister   . Hypertension Mother   . Stroke Mother   . Neuropathy Brother   . COPD Sister     SOCIAL HISTORY Social History   Tobacco Use  . Smoking status: Former Smoker    Packs/day: 0.50     Types: Cigarettes    Quit date: 11/30/1991    Years since quitting: 28.6  . Smokeless tobacco: Never Used  . Tobacco comment: Tobacco use-no  Substance Use Topics  . Alcohol use: No  . Drug use: No         OPHTHALMIC EXAM:  Base Eye Exam    Visual Acuity (Snellen - Linear)      Right Left   Dist cc 20/100 -1 CF @ 1'   Dist ph cc 20/80 NI       Tonometry (Tonopen, 10:28 AM)      Right Left   Pressure 6 10       Pupils  Pupils Dark Light Shape React APD   Right PERRL 1 1 Round Minimal None   Left PERRL 1 1 Round Minimal None       Visual Fields (Counting fingers)      Left Right    Full    Restrictions  Partial outer superior nasal deficiency       Neuro/Psych    Oriented x3: Yes   Mood/Affect: Normal       Dilation    Right eye: 1.0% Mydriacyl, 2.5% Phenylephrine @ 10:28 AM        Slit Lamp and Fundus Exam    External Exam      Right Left   External Normal Normal       Slit Lamp Exam      Right Left   Lids/Lashes Normal Normal   Conjunctiva/Sclera White and quiet White and quiet   Cornea Clear Clear   Anterior Chamber Deep and quiet Deep and quiet   Iris Round and reactive Round and reactive   Lens Posterior chamber intraocular lens Posterior chamber intraocular lens   Anterior Vitreous Normal Normal       Fundus Exam      Right Left   Posterior Vitreous Posterior vitreous detachment    Disc Normal    C/D Ratio 0.2    Macula Hard drusen, Atrophy, no macular thickening, no exudates, no cystoid macular edema,     Vessels Normal, occlusion.    Periphery Normal           IMAGING AND PROCEDURES  Imaging and Procedures for 07/02/20  OCT, Retina - OU - Both Eyes       Right Eye Quality was borderline. Scan locations included subfoveal. Progression has been stable.   Left Eye Quality was borderline. Scan locations included subfoveal. Progression has been stable.   Notes OD, no signs of active maculopathy  OS with chronic CME and  CNVM                ASSESSMENT/PLAN:  Exudative age-related macular degeneration of right eye with inactive choroidal neovascularization (HCC) No signs of recurrent disease      ICD-10-CM   1. Exudative age-related macular degeneration of left eye with active choroidal neovascularization (HCC)  H35.3221 OCT, Retina - OU - Both Eyes  2. Exudative age-related macular degeneration of right eye with inactive choroidal neovascularization (HCC)  H35.3212 OCT, Retina - OU - Both Eyes  3. Chronically dry eyes, bilateral  H04.123     1.  Patient to contact the office promptly should new visual acuity decline or distortions develop particularly in the right eye  2.  3.  Ophthalmic Meds Ordered this visit:  No orders of the defined types were placed in this encounter.      Return for As scheduled, AVASTIN OCT, OS.  There are no Patient Instructions on file for this visit.   Explained the diagnoses, plan, and follow up with the patient and they expressed understanding.  Patient expressed understanding of the importance of proper follow up care.   Alford Highland Myliyah Rebuck M.D. Diseases & Surgery of the Retina and Vitreous [facility] 07/02/20     Abbreviations: M myopia (nearsighted); A astigmatism; H hyperopia (farsighted); P presbyopia; Mrx spectacle prescription;  CTL contact lenses; OD right eye; OS left eye; OU both eyes  XT exotropia; ET esotropia; PEK punctate epithelial keratitis; PEE punctate epithelial erosions; DES dry eye syndrome; MGD meibomian gland dysfunction; ATs artificial tears; PFAT's preservative  free artificial tears; NSC nuclear sclerotic cataract; PSC posterior subcapsular cataract; ERM epi-retinal membrane; PVD posterior vitreous detachment; RD retinal detachment; DM diabetes mellitus; DR diabetic retinopathy; NPDR non-proliferative diabetic retinopathy; PDR proliferative diabetic retinopathy; CSME clinically significant macular edema; DME diabetic  macular edema; dbh dot blot hemorrhages; CWS cotton wool spot; POAG primary open angle glaucoma; C/D cup-to-disc ratio; HVF humphrey visual field; GVF goldmann visual field; OCT optical coherence tomography; IOP intraocular pressure; BRVO Branch retinal vein occlusion; CRVO central retinal vein occlusion; CRAO central retinal artery occlusion; BRAO branch retinal artery occlusion; RT retinal tear; SB scleral buckle; PPV pars plana vitrectomy; VH Vitreous hemorrhage; PRP panretinal laser photocoagulation; IVK intravitreal kenalog; VMT vitreomacular traction; MH Macular hole;  NVD neovascularization of the disc; NVE neovascularization elsewhere; AREDS age related eye disease study; ARMD age related macular degeneration; POAG primary open angle glaucoma; EBMD epithelial/anterior basement membrane dystrophy; ACIOL anterior chamber intraocular lens; IOL intraocular lens; PCIOL posterior chamber intraocular lens; Phaco/IOL phacoemulsification with intraocular lens placement; PRK photorefractive keratectomy; LASIK laser assisted in situ keratomileusis; HTN hypertension; DM diabetes mellitus; COPD chronic obstructive pulmonary disease

## 2020-07-02 NOTE — Assessment & Plan Note (Signed)
No signs of recurrent disease

## 2020-07-03 ENCOUNTER — Ambulatory Visit (INDEPENDENT_AMBULATORY_CARE_PROVIDER_SITE_OTHER): Payer: Medicare Other | Admitting: Cardiology

## 2020-07-03 DIAGNOSIS — I6350 Cerebral infarction due to unspecified occlusion or stenosis of unspecified cerebral artery: Secondary | ICD-10-CM

## 2020-07-03 DIAGNOSIS — Z952 Presence of prosthetic heart valve: Secondary | ICD-10-CM | POA: Diagnosis not present

## 2020-07-03 DIAGNOSIS — I359 Nonrheumatic aortic valve disorder, unspecified: Secondary | ICD-10-CM | POA: Diagnosis not present

## 2020-07-03 DIAGNOSIS — Z5181 Encounter for therapeutic drug level monitoring: Secondary | ICD-10-CM

## 2020-07-03 LAB — POCT INR: INR: 2.7 (ref 2.0–3.0)

## 2020-07-09 ENCOUNTER — Other Ambulatory Visit: Payer: Self-pay

## 2020-07-09 ENCOUNTER — Encounter (INDEPENDENT_AMBULATORY_CARE_PROVIDER_SITE_OTHER): Payer: Self-pay | Admitting: Ophthalmology

## 2020-07-09 ENCOUNTER — Ambulatory Visit (INDEPENDENT_AMBULATORY_CARE_PROVIDER_SITE_OTHER): Payer: Medicare Other | Admitting: Ophthalmology

## 2020-07-09 DIAGNOSIS — H353221 Exudative age-related macular degeneration, left eye, with active choroidal neovascularization: Secondary | ICD-10-CM

## 2020-07-09 MED ORDER — BEVACIZUMAB CHEMO INJECTION 1.25MG/0.05ML SYRINGE FOR KALEIDOSCOPE
1.2500 mg | INTRAVITREAL | Status: AC | PRN
  Administered 2020-07-09: 1.25 mg via INTRAVITREAL

## 2020-07-09 NOTE — Patient Instructions (Signed)
To report any new onset visual acuity decline or distortions either eye

## 2020-07-09 NOTE — Progress Notes (Signed)
07/09/2020     CHIEF COMPLAINT Patient presents for Retina Follow Up   HISTORY OF PRESENT ILLNESS: [name] is a 86 y.o. female who presents to the clinic today for:   HPI    Retina Follow Up    Patient presents with  Wet AMD.  In left eye.  This started 5 weeks ago.  Severity is mild.  Duration of 5 weeks.  Since onset it is stable.          Comments    5 Week AMD F/U OS, poss Avastin OS  Pt denies noticeable changes to [place] OU since last visit. Pt denies ocular pain, flashes of light, or floaters OU.         Last edited by Ileana Roup, COA on 07/09/2020 10:13 AM. (History)      Referring physician: Oval Linsey, MD [address] [place],  [place] [address]  HISTORICAL INFORMATION:   Selected notes from the MEDICAL RECORD NUMBER    Lab Results  Component Value Date   HGBA1C 5.7 (H) 10/27/2019     CURRENT MEDICATIONS: No current outpatient medications on file. (Ophthalmic Drugs)   No current facility-administered medications for this visit. (Ophthalmic Drugs)   Current Outpatient Medications (Other)  Medication Sig  . acetaminophen (TYLENOL) 650 MG CR tablet Take 650 mg by mouth every 8 (eight) hours as needed for pain.  Marland Kitchen ALPRAZolam (XANAX) 0.25 MG tablet Take 0.125-0.25 mg by mouth at bedtime as needed for anxiety.   . Ascorbic Acid (VITAMIN C) 500 MG CAPS Take 500 mg by mouth daily.  Marland Kitchen atorvastatin (LIPITOR) 40 MG tablet TAKE ONE TABLET BY MOUTH ONCE DAILY.  . baclofen (LIORESAL) 10 MG tablet Take 10 mg by mouth as needed for muscle spasms.  . carvedilol (COREG) 6.25 MG tablet Take 6.25 mg by mouth 2 (two) times daily with a meal.  . docusate sodium (COLACE) 100 MG capsule Take 100 mg by mouth at bedtime.   . donepezil (ARICEPT) 10 MG tablet TAKE ONE TABLET BY MOUTH ONCE DAILY.  . ergocalciferol (VITAMIN D2) 50000 UNITS capsule Take 50,000 Units by mouth every Tuesday.   . ferrous sulfate 325 (65 FE) MG EC tablet Take 325 mg by mouth daily.     . furosemide (LASIX) 40 MG tablet TAKE ONE TABLET BY MOUTH ONCE DAILY.  Marland Kitchen HYDROcodone-acetaminophen (NORCO) 7.5-325 MG tablet Take 1 tablet by mouth every 6 (six) hours as needed for moderate pain.  . hydrocortisone (ANUSOL-HC) 2.5 % rectal cream Place 1 application rectally 3 (three) times daily as needed for hemorrhoids or anal itching.  . levalbuterol (XOPENEX) 0.63 MG/3ML nebulizer solution USE 1 VIAL VIA NEBULIZER EVERY 8 HOURS AS NEEDED.  Marland Kitchen meclizine (ANTIVERT) 25 MG tablet Take 25 mg by mouth 3 (three) times daily as needed for dizziness.  . Melatonin 3 MG TABS Take 1 tablet by mouth at bedtime.  . methylPREDNISolone (MEDROL) 4 MG tablet Take 4 mg by mouth 2 (two) times daily as needed.  . multivitamin-lutein (OCUVITE-LUTEIN) CAPS capsule Take 1 capsule by mouth daily.  . ondansetron (ZOFRAN) 4 MG tablet Take 4 mg by mouth every 8 (eight) hours as needed for nausea or vomiting.  . polyethylene glycol (MIRALAX / GLYCOLAX) 17 g packet Take 17 g by mouth daily as needed for moderate constipation.  . pregabalin (LYRICA) 75 MG capsule TAKE 1 CAPSULE BY MOUTH EVERY OTHER DAY.  Marland Kitchen spironolactone (ALDACTONE) 25 MG tablet TAKE (1/2) TABLET BY MOUTH DAILY. (Patient  taking differently: Take 12.5 mg by mouth daily. )  . traMADol (ULTRAM) 50 MG tablet Take 50 mg by mouth every 6 (six) hours as needed for moderate pain.  . traZODone (DESYREL) 50 MG tablet Take 0.5-1 tablets (25-50 mg total) by mouth at bedtime as needed for sleep.  Marland Kitchen. umeclidinium-vilanterol (ANORO ELLIPTA) 62.5-25 MCG/INH AEPB Inhale 1 puff into the lungs daily.   Marland Kitchen. warfarin (COUMADIN) 5 MG tablet TAKE 1/2 TO 1 TABLET ONCE DAILY AS DIRECTED BY COUMADIN CLINIC.  Marland Kitchen. zinc sulfate 50 MG CAPS capsule Take 50 mg by mouth 2 (two) times daily.    No current facility-administered medications for this visit. (Other)      REVIEW OF SYSTEMS:    ALLERGIES Allergies  Allergen Reactions  . Codeine Nausea And Vomiting  . Penicillins Other  (See Comments)    Unknown reaction - listed on MAR Has patient had a PCN reaction causing immediate rash, facial/tongue/throat swelling, SOB or lightheadedness with hypotension: Unknown Has patient had a PCN reaction causing severe rash involving mucus membranes or skin necrosis: Unknown Has patient had a PCN reaction that required hospitalization: Unknown Has patient had a PCN reaction occurring within the last 10 years: Unknown If all of the above answers are "NO", then may proceed with Cephalosporin use.    PAST MEDICAL HISTORY Past Medical History:  Diagnosis Date  . Anemia   . Anxiety   . Arthritis   . Blood transfusion without reported diagnosis   . Carotid artery stenosis    Without infarction  . Cataract   . Chronic combined systolic (congestive) and diastolic (congestive) heart failure (HCC)   . COPD (chronic obstructive pulmonary disease) (HCC)   . Coronary artery disease   . Heart valve replaced by other means   . Hypercholesterolemia    Pure  . Hypertension    Unspecified  . LBBB (left bundle branch block)   . Macular degeneration (senile) of retina, unspecified   . On home O2   . Postsurgical aortocoronary bypass status   . Stroke (HCC)   . Transient global amnesia   . Unspecified hereditary and idiopathic peripheral neuropathy   . Unspecified vitamin D deficiency    Past Surgical History:  Procedure Laterality Date  . AORTIC VALVE REPLACEMENT    . APPENDECTOMY    . CORONARY ARTERY BYPASS GRAFT    . HIP PINNING,CANNULATED Right 07/18/2018   Procedure: RIGHT CANNULATED HIP PINNING;  Surgeon: Myrene GalasHandy, Michael, MD;  Location: [facility] OR;  Service: Orthopedics;  Laterality: Right;  . TONSILLECTOMY      FAMILY HISTORY Family History  Problem Relation Age of Onset  . Heart attack Father   . Heart disease Father   . Stroke Father   . Stroke Sister   . Hypertension Mother   . Stroke Mother   . Neuropathy Brother   . COPD Sister     SOCIAL HISTORY Social History    Tobacco Use  . Smoking status: Former Smoker    Packs/day: 0.50    Types: Cigarettes    Quit date: 11/30/1991    Years since quitting: 28.6  . Smokeless tobacco: Never Used  . Tobacco comment: Tobacco use-no  Substance Use Topics  . Alcohol use: No  . Drug use: No         OPHTHALMIC EXAM:  Base Eye Exam    Visual Acuity (ETDRS)      Right Left   Dist cc 20/70 +2 20/200 -1   Dist ph cc  20/40 -2 NI   Correction: Glasses       Tonometry (Tonopen, 10:14 AM)      Right Left   Pressure 07 09       Pupils      Dark Light Shape React APD   Right 1 1 Round Minimal None   Left 1 1 Round Minimal None       Visual Fields (Counting fingers)      Left Right    Full    Restrictions  Partial outer superior nasal deficiency       Extraocular Movement      Right Left    Full Full       Neuro/Psych    Oriented x3: Yes   Mood/Affect: Normal       Dilation    Left eye: 1.0% Mydriacyl, 2.5% Phenylephrine @ 10:18 AM        Slit Lamp and Fundus Exam    External Exam      Right Left   External Normal Normal       Slit Lamp Exam      Right Left   Lids/Lashes Normal Normal   Conjunctiva/Sclera White and quiet White and quiet   Cornea Clear Clear   Anterior Chamber Deep and quiet Deep and quiet   Iris Round and reactive Round and reactive   Lens Posterior chamber intraocular lens Posterior chamber intraocular lens   Anterior Vitreous Normal Normal       Fundus Exam      Right Left   Posterior Vitreous  Posterior vitreous detachment   Disc  Normal   C/D Ratio 0.2 0.3   Macula Normal Subretinal hemorrhage, Macular thickening   Vessels  Normal   Periphery Normal Normal          IMAGING AND PROCEDURES  Imaging and Procedures for 07/09/20  OCT, Retina - OU - Both Eyes       Right Eye Quality was good. Scan locations included subfoveal. Central Foveal Thickness: 236.   Left Eye Quality was good. Scan locations included subfoveal. Central Foveal  Thickness: 473. Progression has been stable. Findings include cystoid macular edema, intraretinal fluid.   Notes Persistent activity left eye in the macular region from AMD, will repeat of intravitreal Avastin OS today and will arrange for intravitreal Eylea OS next 5 to 6 weeks       Intravitreal Injection, Pharmacologic Agent - OS - Left Eye       Time Out 07/09/2020. 11:30 AM. Confirmed correct patient, procedure, site, and patient consented.   Anesthesia Anesthetic medications included Akten 3.5%.   Procedure Preparation included Tobramycin 0.3%, 10% betadine to eyelids, 5% betadine to ocular surface. A supplied needle was used.   Injection:  1.25 mg Bevacizumab (AVASTIN) SOLN   NDC: [address]-1, Lot: [id number]   Route: Intravitreal, Site: Left Eye, Waste: 0 mg  Post-op Post injection exam found visual acuity of at least counting fingers. The patient tolerated the procedure well. There were no complications. The patient received written and verbal post procedure care education. Post injection medications were not given.                 ASSESSMENT/PLAN:  Exudative age-related macular degeneration of left eye with active choroidal neovascularization (HCC) OS, new onset CNVM still active post Avastin, will repeat today and be prepared to switch to intravitreal Eylea OS next      ICD-10-CM   1. Exudative age-related macular degeneration of left eye with  active choroidal neovascularization (HCC)  H35.3221 OCT, Retina - OU - Both Eyes    Intravitreal Injection, Pharmacologic Agent - OS - Left Eye    Bevacizumab (AVASTIN) SOLN 1.25 mg    1.  Repeat intravitreal Avastin OS today at 6 weeks and likely change to intravitreal Eylea if not improved  2.  3.  Ophthalmic Meds Ordered this visit:  Meds ordered this encounter  Medications  . Bevacizumab (AVASTIN) SOLN 1.25 mg       Return in about 5 weeks (around 08/13/2020), or Okay for 5 to 6-week interval, for dilate,  OS, EYLEA OCT.  Patient Instructions  To report any new onset visual acuity decline or distortions either eye    Explained the diagnoses, plan, and follow up with the patient and they expressed understanding.  Patient expressed understanding of the importance of proper follow up care.   Alford Highland Arra Connaughton M.D. Diseases & Surgery of the Retina and Vitreous [facility] 07/09/20     Abbreviations: M myopia (nearsighted); A astigmatism; H hyperopia (farsighted); P presbyopia; Mrx spectacle prescription;  CTL contact lenses; OD right eye; OS left eye; OU both eyes  XT exotropia; ET esotropia; PEK punctate epithelial keratitis; PEE punctate epithelial erosions; DES dry eye syndrome; MGD meibomian gland dysfunction; ATs artificial tears; PFAT's preservative free artificial tears; NSC nuclear sclerotic cataract; PSC posterior subcapsular cataract; ERM epi-retinal membrane; PVD posterior vitreous detachment; RD retinal detachment; DM diabetes mellitus; DR diabetic retinopathy; NPDR non-proliferative diabetic retinopathy; PDR proliferative diabetic retinopathy; CSME clinically significant macular edema; DME diabetic macular edema; dbh dot blot hemorrhages; CWS cotton wool spot; POAG primary open angle glaucoma; C/D cup-to-disc ratio; HVF humphrey visual field; GVF goldmann visual field; OCT optical coherence tomography; IOP intraocular pressure; BRVO Branch retinal vein occlusion; CRVO central retinal vein occlusion; CRAO central retinal artery occlusion; BRAO branch retinal artery occlusion; RT retinal tear; SB scleral buckle; PPV pars plana vitrectomy; VH Vitreous hemorrhage; PRP panretinal laser photocoagulation; IVK intravitreal kenalog; VMT vitreomacular traction; MH Macular hole;  NVD neovascularization of the disc; NVE neovascularization elsewhere; AREDS age related eye disease study; ARMD age related macular degeneration; POAG primary open angle glaucoma; EBMD epithelial/anterior  basement membrane dystrophy; ACIOL anterior chamber intraocular lens; IOL intraocular lens; PCIOL posterior chamber intraocular lens; Phaco/IOL phacoemulsification with intraocular lens placement; PRK photorefractive keratectomy; LASIK laser assisted in situ keratomileusis; HTN hypertension; DM diabetes mellitus; COPD chronic obstructive pulmonary disease

## 2020-07-09 NOTE — Assessment & Plan Note (Signed)
OS, new onset CNVM still active post Avastin, will repeat today and be prepared to switch to intravitreal Eylea OS next

## 2020-07-17 ENCOUNTER — Ambulatory Visit (INDEPENDENT_AMBULATORY_CARE_PROVIDER_SITE_OTHER): Payer: Medicare Other | Admitting: Cardiology

## 2020-07-17 DIAGNOSIS — Z5181 Encounter for therapeutic drug level monitoring: Secondary | ICD-10-CM

## 2020-07-17 DIAGNOSIS — I359 Nonrheumatic aortic valve disorder, unspecified: Secondary | ICD-10-CM

## 2020-07-17 DIAGNOSIS — I6350 Cerebral infarction due to unspecified occlusion or stenosis of unspecified cerebral artery: Secondary | ICD-10-CM

## 2020-07-17 DIAGNOSIS — Z952 Presence of prosthetic heart valve: Secondary | ICD-10-CM | POA: Diagnosis not present

## 2020-07-17 LAB — POCT INR: INR: 2.6 (ref 2.0–3.0)

## 2020-07-19 ENCOUNTER — Other Ambulatory Visit: Payer: Self-pay | Admitting: Internal Medicine

## 2020-07-26 ENCOUNTER — Other Ambulatory Visit: Payer: Self-pay | Admitting: Cardiology

## 2020-07-31 ENCOUNTER — Ambulatory Visit (INDEPENDENT_AMBULATORY_CARE_PROVIDER_SITE_OTHER): Payer: Medicare Other | Admitting: Pharmacist

## 2020-07-31 DIAGNOSIS — I359 Nonrheumatic aortic valve disorder, unspecified: Secondary | ICD-10-CM

## 2020-07-31 DIAGNOSIS — Z7901 Long term (current) use of anticoagulants: Secondary | ICD-10-CM

## 2020-07-31 LAB — POCT INR
INR: 3.4 — AB (ref 2.0–3.0)
INR: 3.4 — AB (ref 2.0–3.0)

## 2020-08-01 ENCOUNTER — Ambulatory Visit (INDEPENDENT_AMBULATORY_CARE_PROVIDER_SITE_OTHER): Payer: Medicare Other | Admitting: Cardiovascular Disease

## 2020-08-01 DIAGNOSIS — Z5181 Encounter for therapeutic drug level monitoring: Secondary | ICD-10-CM

## 2020-08-01 DIAGNOSIS — Z952 Presence of prosthetic heart valve: Secondary | ICD-10-CM

## 2020-08-01 DIAGNOSIS — I359 Nonrheumatic aortic valve disorder, unspecified: Secondary | ICD-10-CM | POA: Diagnosis not present

## 2020-08-01 DIAGNOSIS — I6350 Cerebral infarction due to unspecified occlusion or stenosis of unspecified cerebral artery: Secondary | ICD-10-CM

## 2020-08-05 ENCOUNTER — Telehealth: Payer: Self-pay

## 2020-08-05 NOTE — Telephone Encounter (Signed)
Received message requesting call back to [name].  Return call made to [name] at 447 pm.  [name] states patient is having some fluctuations with her weight.  Currently weighs 111 lbs but increases to 113 lbs at times.  Caregivers are using prn breathing treatments more often over the last several weeks. [name] would like to schedule an appointment with PC.  After reviewing the schedule for patient, appt date set for Tuesday, September 28 at 10 am.  [name] has requested a follow up call after this visit is completed.

## 2020-08-07 ENCOUNTER — Ambulatory Visit (INDEPENDENT_AMBULATORY_CARE_PROVIDER_SITE_OTHER): Payer: Medicare Other | Admitting: Cardiology

## 2020-08-07 DIAGNOSIS — Z5181 Encounter for therapeutic drug level monitoring: Secondary | ICD-10-CM | POA: Diagnosis not present

## 2020-08-07 DIAGNOSIS — I359 Nonrheumatic aortic valve disorder, unspecified: Secondary | ICD-10-CM

## 2020-08-07 DIAGNOSIS — I6350 Cerebral infarction due to unspecified occlusion or stenosis of unspecified cerebral artery: Secondary | ICD-10-CM | POA: Diagnosis not present

## 2020-08-07 DIAGNOSIS — Z952 Presence of prosthetic heart valve: Secondary | ICD-10-CM | POA: Diagnosis not present

## 2020-08-07 LAB — POCT INR
INR: 3.4 — AB (ref 2.0–3.0)
INR: 3.4 — AB (ref 2.0–3.0)

## 2020-08-08 ENCOUNTER — Encounter: Payer: Self-pay | Admitting: Cardiovascular Disease

## 2020-08-08 DIAGNOSIS — I359 Nonrheumatic aortic valve disorder, unspecified: Secondary | ICD-10-CM

## 2020-08-08 DIAGNOSIS — I6350 Cerebral infarction due to unspecified occlusion or stenosis of unspecified cerebral artery: Secondary | ICD-10-CM

## 2020-08-08 DIAGNOSIS — Z952 Presence of prosthetic heart valve: Secondary | ICD-10-CM

## 2020-08-08 DIAGNOSIS — Z5181 Encounter for therapeutic drug level monitoring: Secondary | ICD-10-CM

## 2020-08-08 NOTE — Progress Notes (Signed)
This encounter was created in error - please disregard.

## 2020-08-11 DIAGNOSIS — D510 Vitamin B12 deficiency anemia due to intrinsic factor deficiency: Secondary | ICD-10-CM | POA: Diagnosis not present

## 2020-08-12 ENCOUNTER — Other Ambulatory Visit: Payer: Medicare Other

## 2020-08-12 ENCOUNTER — Telehealth: Payer: Self-pay

## 2020-08-12 ENCOUNTER — Telehealth: Payer: Self-pay | Admitting: Cardiology

## 2020-08-12 ENCOUNTER — Other Ambulatory Visit: Payer: Self-pay

## 2020-08-12 DIAGNOSIS — I420 Dilated cardiomyopathy: Secondary | ICD-10-CM

## 2020-08-12 DIAGNOSIS — Z515 Encounter for palliative care: Secondary | ICD-10-CM

## 2020-08-12 DIAGNOSIS — I2510 Atherosclerotic heart disease of native coronary artery without angina pectoris: Secondary | ICD-10-CM

## 2020-08-12 NOTE — Progress Notes (Signed)
COMMUNITY PALLIATIVE CARE SW NOTE  PATIENT NAME: [name] DOB: May 02, 1926 MRN: [id number]  PRIMARY CARE PROVIDER: Lucia Gaskins, MD  RESPONSIBLE PARTY:  Acct ID - Guarantor Home Phone Work Phone Relationship Acct Type  [id number] - [name],[phone]  Self P/F     [name], [place], [place] [address]     PLAN OF CARE and INTERVENTIONS:             GOALS OF CARE/ ADVANCE CARE PLANNING:  Patient is a DNR. Patients goal is to remain at home as independent as possible. 2. SOCIAL/EMOTIONAL/SPIRITUAL ASSESSMENT/ INTERVENTIONS:  SW and RN Almyra Free met with patient and patients live-in caregiver in patients home. Patient lives in a one story home. Patient has chronic COPD and respiratory failure. Patient and caregiver updated SW and RN medical condition and changes. Patient states that she had a few episodes of having to utilizer her breathing treatment more frequently. Patient continues on 4LPM as needed. Patient shared that she and he caregiver go out daily for lunch and that she goes to local gym M,W,F. No falls reported. No pain reported. Caregiver shared that patient has not been eating much and typically eats one good meal a day, patient does not like ensure or boost. No recent medication changes. Patients caregiver checks Coumadin at home. Caregiver also takes patients vitals and weights daily, patients weight has been fluctuating over the past 2 weeks between 110-114lbs, caregiver notices that patients weight is highest on days she has to give her an extra breathing treatment. RN reviewed medications and took vitals 96%RA 57R 140/42, 111lbs. RN heard crackling in in lower R lobe, will update PCP. SW discussed goals, reviewed care plan, provided emotional support, used active and reflective listening. Palliative care will continue to monitor and assist with long term care planning as needed.  3. PATIENT/CAREGIVER EDUCATION/ COPING:  Patient A&O x3, Pt is HOH but able to answer  questions appropriately. Patient denies any anxiety or depression, patient takes xanax. Patients family is supportive 4. PERSONAL EMERGENCY PLAN:  Patient will call 9-1-1 for emergencies.  5. COMMUNITY RESOURCES COORDINATION/ HEALTH CARE NAVIGATION:  Patient and patients daughter manages her care. 6. FINANCIAL/LEGAL CONCERNS/INTERVENTIONS:  None.     SOCIAL HX:  Social History   Tobacco Use   Smoking status: Former Smoker    Packs/day: 0.50    Types: Cigarettes    Quit date: 11/30/1991    Years since quitting: 28.7   Smokeless tobacco: Never Used   Tobacco comment: Tobacco use-no  Substance Use Topics   Alcohol use: No    CODE STATUS: DNR  ADVANCED DIRECTIVES: Y MOST FORM COMPLETE:  N HOSPICE EDUCATION PROVIDED: N  PPS: Patient is independent with all ADL's. Caregiver preps meals and offers assistance minimally with dressing as needed.    Time spent: 30 min      [place], [place]

## 2020-08-12 NOTE — Telephone Encounter (Signed)
415pm.  Phone call made to Dr. Ludwig Clarks office with update on Palliative Care RN visit with patient.  428pm.  Phone call made to [name] to provide an update on my visit with patient.    BP 140/42 P 57 O2 sats 96% on RA. Current weight 111 lbs  Patient's weight has been fluctuating for the last 2 weeks. Ranging from 110-115.  Extra 20 mg of Lasix was given last Monday and an extra 40 mg of Lasix was given last Tuesday-Friday. Increase use of breathing tx was reported last week as well.  Patient is denying any shortness of breath but is noted to have crackles to her right lower lobe but no edema is noted to bilateral lower extremities.  Daughter states patient has about 12 more Vitamin C tablets left and once this is completed it will be discontinued.  Daughter voiced desire to keep patient out of the hospital.  Advised that MD was notified today of assessment.   PLAN:  If any changes in medications are noted,  I will call her back. Palliative Care team will see patient again next month for home visit.

## 2020-08-12 NOTE — Telephone Encounter (Signed)
Ok for continued lasix as needed; check bmet one week; fu with APP 1 week [name]

## 2020-08-12 NOTE — Telephone Encounter (Signed)
Returned call to [employer] with [employer].    She states last week the family requested a visit due to having to give extra breathing treatments and weight flucuations.    She is normally on oxygen and does a nightly breathing treatment.  They were giving an additional treatment daily.  Due to her weight increase they gave additional lasix 20 9/20.  Extra 40 of lasix on 9/21, 9/22, 9/23. 9/24.   They are back to her baseline dose of 40 daily.   Her weights have flucuated 110-115.   She states during the visit today she noticed crackles in her RLL which was new for her.   She does not have any peripheral edema or SOB.  O2 today 96% without her O2, BP 140/42 HR 57, weight today 111 lbs.   She states they are really trying to keep her out of the hospital if possible.  She wanted to confirm that it was okay for them to continue the additional Lasix PRN for weight increase and make MD aware of crackles noted on her assessment.    Routed to MD to review.   Okay to call [name] or Beryle Quant (daughter) back with recommendations.

## 2020-08-12 NOTE — Telephone Encounter (Signed)
[name] is calling stating she did a visit with the patient today. She states care giver advised her starting 07/26/20 her weight was ranging 110-115 lbs. Tuesday-Friday she gave [name] an extra 40 mg lasix. Last Monday she gave an extra 20 MG's of Lasix as well as extra breathing treatments. [name]'s weight today is 111 lbs, and she was not given any extra lasix. [name] heard crackles in her right lower lobe, which was different from her last visit in July.

## 2020-08-13 ENCOUNTER — Encounter (INDEPENDENT_AMBULATORY_CARE_PROVIDER_SITE_OTHER): Payer: Self-pay | Admitting: Ophthalmology

## 2020-08-13 ENCOUNTER — Ambulatory Visit (INDEPENDENT_AMBULATORY_CARE_PROVIDER_SITE_OTHER): Payer: Medicare Other | Admitting: Ophthalmology

## 2020-08-13 ENCOUNTER — Other Ambulatory Visit: Payer: Self-pay

## 2020-08-13 DIAGNOSIS — H353221 Exudative age-related macular degeneration, left eye, with active choroidal neovascularization: Secondary | ICD-10-CM | POA: Diagnosis not present

## 2020-08-13 MED ORDER — AFLIBERCEPT 2MG/0.05ML IZ SOLN FOR KALEIDOSCOPE
2.0000 mg | INTRAVITREAL | Status: AC | PRN
  Administered 2020-08-13: 2 mg via INTRAVITREAL

## 2020-08-13 NOTE — Telephone Encounter (Signed)
Left message for pt daughter to call  ?

## 2020-08-13 NOTE — Assessment & Plan Note (Signed)
The nature of wet macular degeneration was discussed with the patient.  Forms of therapy reviewed include the use of Anti-VEGF medications injected painlessly into the eye, as well as other possible treatment modalities, including thermal laser therapy. Fellow eye involvement and risks were discussed with the patient. Upon the finding of wet age related macular degeneration, treatment will be offered. The treatment regimen is on a treat as needed basis with the intent to treat if necessary and extend interval of exams when possible. On average 1 out of 6 patients do not need lifetime therapy. However, the risk of recurrent disease is high for a lifetime.  Initially monthly, then periodic, examinations and evaluations will determine whether the next treatment is required on the day of the examination.  OS, chronic active CNVM despite intravitreal Avastin at short interval.,  Will change to intravitreal Eylea OS today

## 2020-08-13 NOTE — Telephone Encounter (Signed)
Follow up:      Patient daughter calling back.

## 2020-08-13 NOTE — Telephone Encounter (Signed)
Spoke with [name], Aware of dr Ludwig Clarks recommendations. Lab orders mailed to the pt  And Follow up scheduled

## 2020-08-13 NOTE — Progress Notes (Signed)
08/13/2020     CHIEF COMPLAINT Patient presents for Retina Follow Up   HISTORY OF PRESENT ILLNESS: [name] is a 86 y.o. female who presents to the clinic today for:   HPI    Retina Follow Up    Patient presents with  Wet AMD.  In left eye.  This started 5 weeks ago.  Severity is mild.  Duration of 5 weeks.  Since onset it is stable.          Comments    5 Week AMD F/U OS, poss Eylea OS  Pt sts, "I just can't see as good. I don't know what the problem is."  Pt denies ocular pain, flashes of light, or floaters OU.         Last edited by Ileana Roup, COA on 08/13/2020  9:58 AM. (History)      Referring physician: Oval Linsey, MD [address] [place],  [place] [address]  HISTORICAL INFORMATION:   Selected notes from the MEDICAL RECORD NUMBER    Lab Results  Component Value Date   HGBA1C 5.7 (H) 10/27/2019     CURRENT MEDICATIONS: No current outpatient medications on file. (Ophthalmic Drugs)   No current facility-administered medications for this visit. (Ophthalmic Drugs)   Current Outpatient Medications (Other)  Medication Sig  . acetaminophen (TYLENOL) 650 MG CR tablet Take 650 mg by mouth every 8 (eight) hours as needed for pain.  Marland Kitchen ALPRAZolam (XANAX) 0.25 MG tablet Take 0.125-0.25 mg by mouth at bedtime as needed for anxiety.   Ailene Ards ELLIPTA 62.5-25 MCG/INH AEPB USE 1 INHALATION ONCE DAILY.  Marland Kitchen Ascorbic Acid (VITAMIN C) 500 MG CAPS Take 500 mg by mouth daily.  Marland Kitchen atorvastatin (LIPITOR) 40 MG tablet TAKE ONE TABLET BY MOUTH ONCE DAILY.  . baclofen (LIORESAL) 10 MG tablet Take 10 mg by mouth as needed for muscle spasms.  . carvedilol (COREG) 6.25 MG tablet TAKE (1) TABLET BY MOUTH TWICE DAILY.  Marland Kitchen docusate sodium (COLACE) 100 MG capsule Take 100 mg by mouth at bedtime.   . donepezil (ARICEPT) 10 MG tablet TAKE ONE TABLET BY MOUTH ONCE DAILY.  . ergocalciferol (VITAMIN D2) 50000 UNITS capsule Take 50,000 Units by mouth every Tuesday.   .  ferrous sulfate 325 (65 FE) MG EC tablet Take 325 mg by mouth daily.   . furosemide (LASIX) 40 MG tablet TAKE ONE TABLET BY MOUTH ONCE DAILY.  Marland Kitchen HYDROcodone-acetaminophen (NORCO) 7.5-325 MG tablet Take 1 tablet by mouth every 6 (six) hours as needed for moderate pain.  . hydrocortisone (ANUSOL-HC) 2.5 % rectal cream Place 1 application rectally 3 (three) times daily as needed for hemorrhoids or anal itching.  . levalbuterol (XOPENEX) 0.63 MG/3ML nebulizer solution USE 1 VIAL VIA NEBULIZER EVERY 8 HOURS AS NEEDED.  Marland Kitchen meclizine (ANTIVERT) 25 MG tablet Take 25 mg by mouth 3 (three) times daily as needed for dizziness.  . Melatonin 3 MG TABS Take 1 tablet by mouth at bedtime.  . methylPREDNISolone (MEDROL) 4 MG tablet Take 4 mg by mouth 2 (two) times daily as needed.  . multivitamin-lutein (OCUVITE-LUTEIN) CAPS capsule Take 1 capsule by mouth daily.  . ondansetron (ZOFRAN) 4 MG tablet Take 4 mg by mouth every 8 (eight) hours as needed for nausea or vomiting.  . polyethylene glycol (MIRALAX / GLYCOLAX) 17 g packet Take 17 g by mouth daily as needed for moderate constipation.  . pregabalin (LYRICA) 75 MG capsule TAKE 1 CAPSULE BY MOUTH EVERY OTHER DAY.  Marland Kitchen  spironolactone (ALDACTONE) 25 MG tablet TAKE (1/2) TABLET BY MOUTH DAILY. (Patient taking differently: Take 12.5 mg by mouth daily. )  . traMADol (ULTRAM) 50 MG tablet Take 50 mg by mouth every 6 (six) hours as needed for moderate pain.  . traZODone (DESYREL) 50 MG tablet Take 0.5-1 tablets (25-50 mg total) by mouth at bedtime as needed for sleep.  Marland Kitchen. warfarin (COUMADIN) 5 MG tablet TAKE 1/2 TO 1 TABLET ONCE DAILY AS DIRECTED BY COUMADIN CLINIC.  Marland Kitchen. zinc sulfate 50 MG CAPS capsule Take 50 mg by mouth 2 (two) times daily.    No current facility-administered medications for this visit. (Other)      REVIEW OF SYSTEMS:    ALLERGIES Allergies  Allergen Reactions  . Codeine Nausea And Vomiting  . Penicillins Other (See Comments)    Unknown  reaction - listed on MAR Has patient had a PCN reaction causing immediate rash, facial/tongue/throat swelling, SOB or lightheadedness with hypotension: Unknown Has patient had a PCN reaction causing severe rash involving mucus membranes or skin necrosis: Unknown Has patient had a PCN reaction that required hospitalization: Unknown Has patient had a PCN reaction occurring within the last 10 years: Unknown If all of the above answers are "NO", then may proceed with Cephalosporin use.    PAST MEDICAL HISTORY Past Medical History:  Diagnosis Date  . Anemia   . Anxiety   . Arthritis   . Blood transfusion without reported diagnosis   . Carotid artery stenosis    Without infarction  . Cataract   . Chronic combined systolic (congestive) and diastolic (congestive) heart failure (HCC)   . COPD (chronic obstructive pulmonary disease) (HCC)   . Coronary artery disease   . Heart valve replaced by other means   . Hypercholesterolemia    Pure  . Hypertension    Unspecified  . LBBB (left bundle branch block)   . Macular degeneration (senile) of retina, unspecified   . On home O2   . Postsurgical aortocoronary bypass status   . Stroke (HCC)   . Transient global amnesia   . Unspecified hereditary and idiopathic peripheral neuropathy   . Unspecified vitamin D deficiency    Past Surgical History:  Procedure Laterality Date  . AORTIC VALVE REPLACEMENT    . APPENDECTOMY    . CORONARY ARTERY BYPASS GRAFT    . HIP PINNING,CANNULATED Right 07/18/2018   Procedure: RIGHT CANNULATED HIP PINNING;  Surgeon: Myrene GalasHandy, Michael, MD;  Location: [facility] OR;  Service: Orthopedics;  Laterality: Right;  . TONSILLECTOMY      FAMILY HISTORY Family History  Problem Relation Age of Onset  . Heart attack Father   . Heart disease Father   . Stroke Father   . Stroke Sister   . Hypertension Mother   . Stroke Mother   . Neuropathy Brother   . COPD Sister     SOCIAL HISTORY Social History   Tobacco Use  .  Smoking status: Former Smoker    Packs/day: 0.50    Types: Cigarettes    Quit date: 11/30/1991    Years since quitting: 28.7  . Smokeless tobacco: Never Used  . Tobacco comment: Tobacco use-no  Substance Use Topics  . Alcohol use: No  . Drug use: No         OPHTHALMIC EXAM:  Base Eye Exam    Visual Acuity (ETDRS)      Right Left   Dist cc 20/50 -2 20/80 +1   Dist ph cc 20/40 +1 NI  Correction: Glasses       Tonometry (Tonopen, 9:58 AM)      Right Left   Pressure 10 11       Pupils      Pupils Dark Light Shape React APD   Right PERRL 1 1 Round Minimal None   Left PERRL 1 1 Round Minimal None       Visual Fields (Counting fingers)      Left Right    Full    Restrictions  Partial outer superior nasal deficiency       Extraocular Movement      Right Left    Full Full       Neuro/Psych    Oriented x3: Yes   Mood/Affect: Normal       Dilation    Left eye: 1.0% Mydriacyl, 2.5% Phenylephrine @ 10:03 AM        Slit Lamp and Fundus Exam    External Exam      Right Left   External Normal Normal       Slit Lamp Exam      Right Left   Lids/Lashes Normal Normal   Conjunctiva/Sclera White and quiet White and quiet   Cornea Clear Clear   Anterior Chamber Deep and quiet Deep and quiet   Iris Round and reactive Round and reactive   Lens Posterior chamber intraocular lens Posterior chamber intraocular lens   Anterior Vitreous Normal Normal       Fundus Exam      Right Left   Posterior Vitreous  Posterior vitreous detachment, Central vitreous floaters   Disc  Normal   C/D Ratio  0.3   Macula Normal Subretinal hemorrhage, Macular thickening   Vessels  Normal   Periphery Normal Normal          IMAGING AND PROCEDURES  Imaging and Procedures for 08/13/20  OCT, Retina - OU - Both Eyes       Right Eye Quality was good. Scan locations included subfoveal.   Left Eye Quality was good. Scan locations included subfoveal. Central Foveal Thickness:  535. Progression has been stable. Findings include cystoid macular edema.   Notes Chronic active CNVM left eye now resistant to intravitreal Avastin.  We will commence left eye with Eylea No. 1       Intravitreal Injection, Pharmacologic Agent - OS - Left Eye       Time Out 08/13/2020. 11:21 AM. Confirmed correct patient, procedure, site, and patient consented.   Anesthesia Topical anesthesia was used. Anesthetic medications included Akten 3.5%.   Procedure Preparation included Tobramycin 0.3%, 10% betadine to eyelids, 5% betadine to ocular surface. A 30 gauge needle was used.   Injection:  2 mg aflibercept Gretta Cool) SOLN   NDC: [id number], Lot: [id number]   Route: Intravitreal, Site: Left Eye, Waste: 0 mg  Post-op Post injection exam found visual acuity of at least counting fingers. The patient tolerated the procedure well. There were no complications. The patient received written and verbal post procedure care education. Post injection medications were not given.                 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  Forms of therapy reviewed include the use of Anti-VEGF medications injected painlessly into the eye, as well as other possible treatment modalities, including thermal laser therapy. Fellow eye involvement and risks were discussed with the  patient. Upon the finding of wet age related macular degeneration, treatment will be offered. The treatment regimen is on a treat as needed basis with the intent to treat if necessary and extend interval of exams when possible. On average 1 out of 6 patients do not need lifetime therapy. However, the risk of recurrent disease is high for a lifetime.  Initially monthly, then periodic, examinations and evaluations will determine whether the next treatment is required on the day of  the examination.  OS, chronic active CNVM despite intravitreal Avastin at short interval.,  Will change to intravitreal Eylea OS today      ICD-10-CM   1. Exudative age-related macular degeneration of left eye with active choroidal neovascularization (HCC)  H35.3221 OCT, Retina - OU - Both Eyes    Intravitreal Injection, Pharmacologic Agent - OS - Left Eye    aflibercept (EYLEA) SOLN 2 mg    1.  OS, resistant to intravitreal Avastin, with CNVM worsening, will commence with intravitreal Eylea today #1  2.  Left eye 5 weeks 3.  Ophthalmic Meds Ordered this visit:  Meds ordered this encounter  Medications  . aflibercept (EYLEA) SOLN 2 mg       Return in about 5 weeks (around 09/17/2020) for dilate, OS, EYLEA OCT.  There are no Patient Instructions on file for this visit.   Explained the diagnoses, plan, and follow up with the patient and they expressed understanding.  Patient expressed understanding of the importance of proper follow up care.   Alford Highland Aadyn Buchheit M.D. Diseases & Surgery of the Retina and Vitreous [facility] 08/13/20     Abbreviations: M myopia (nearsighted); A astigmatism; H hyperopia (farsighted); P presbyopia; Mrx spectacle prescription;  CTL contact lenses; OD right eye; OS left eye; OU both eyes  XT exotropia; ET esotropia; PEK punctate epithelial keratitis; PEE punctate epithelial erosions; DES dry eye syndrome; MGD meibomian gland dysfunction; ATs artificial tears; PFAT's preservative free artificial tears; NSC nuclear sclerotic cataract; PSC posterior subcapsular cataract; ERM epi-retinal membrane; PVD posterior vitreous detachment; RD retinal detachment; DM diabetes mellitus; DR diabetic retinopathy; NPDR non-proliferative diabetic retinopathy; PDR proliferative diabetic retinopathy; CSME clinically significant macular edema; DME diabetic macular edema; dbh dot blot hemorrhages; CWS cotton wool spot; POAG primary open angle glaucoma; C/D  cup-to-disc ratio; HVF humphrey visual field; GVF goldmann visual field; OCT optical coherence tomography; IOP intraocular pressure; BRVO Branch retinal vein occlusion; CRVO central retinal vein occlusion; CRAO central retinal artery occlusion; BRAO branch retinal artery occlusion; RT retinal tear; SB scleral buckle; PPV pars plana vitrectomy; VH Vitreous hemorrhage; PRP panretinal laser photocoagulation; IVK intravitreal kenalog; VMT vitreomacular traction; MH Macular hole;  NVD neovascularization of the disc; NVE neovascularization elsewhere; AREDS age related eye disease study; ARMD age related macular degeneration; POAG primary open angle glaucoma; EBMD epithelial/anterior basement membrane dystrophy; ACIOL anterior chamber intraocular lens; IOL intraocular lens; PCIOL posterior chamber intraocular lens; Phaco/IOL phacoemulsification with intraocular lens placement; PRK photorefractive keratectomy; LASIK laser assisted in situ keratomileusis; HTN hypertension; DM diabetes mellitus; COPD chronic obstructive pulmonary disease

## 2020-08-14 ENCOUNTER — Telehealth: Payer: Self-pay

## 2020-08-14 NOTE — Telephone Encounter (Signed)
856 am.  Phone call made to [name] to advised of virtual visit on October 6th at 145 pm.  [name] has requested this be changed to a telephone call due to challenges with a virtual visit.  Same date and time will work.  I advised of bmet that has been requested.  [name] states that Dr. Janna Arch has ordered blood work so she will send that over to cardiology for their records as well.  Communication with Stanton Kidney, RN through [employer] regarding change from virtual to telephonic visit completed.  PC will contact patient next month for an in person home visit.

## 2020-08-15 ENCOUNTER — Ambulatory Visit (INDEPENDENT_AMBULATORY_CARE_PROVIDER_SITE_OTHER): Payer: Medicare Other | Admitting: Pharmacist Clinician (PhC)/ Clinical Pharmacy Specialist

## 2020-08-15 DIAGNOSIS — I6350 Cerebral infarction due to unspecified occlusion or stenosis of unspecified cerebral artery: Secondary | ICD-10-CM | POA: Diagnosis not present

## 2020-08-15 DIAGNOSIS — Z7901 Long term (current) use of anticoagulants: Secondary | ICD-10-CM | POA: Diagnosis not present

## 2020-08-15 DIAGNOSIS — I359 Nonrheumatic aortic valve disorder, unspecified: Secondary | ICD-10-CM | POA: Diagnosis not present

## 2020-08-15 DIAGNOSIS — Z5181 Encounter for therapeutic drug level monitoring: Secondary | ICD-10-CM

## 2020-08-15 DIAGNOSIS — Z952 Presence of prosthetic heart valve: Secondary | ICD-10-CM | POA: Diagnosis not present

## 2020-08-15 LAB — POCT INR: INR: 2.3 (ref 2.0–3.0)

## 2020-08-19 DIAGNOSIS — E7849 Other hyperlipidemia: Secondary | ICD-10-CM | POA: Diagnosis not present

## 2020-08-19 DIAGNOSIS — G609 Hereditary and idiopathic neuropathy, unspecified: Secondary | ICD-10-CM | POA: Diagnosis not present

## 2020-08-19 DIAGNOSIS — I4891 Unspecified atrial fibrillation: Secondary | ICD-10-CM | POA: Diagnosis not present

## 2020-08-19 DIAGNOSIS — I420 Dilated cardiomyopathy: Secondary | ICD-10-CM | POA: Diagnosis not present

## 2020-08-19 DIAGNOSIS — I110 Hypertensive heart disease with heart failure: Secondary | ICD-10-CM | POA: Diagnosis not present

## 2020-08-20 ENCOUNTER — Encounter: Payer: Self-pay | Admitting: *Deleted

## 2020-08-20 ENCOUNTER — Encounter: Payer: Self-pay | Admitting: Cardiology

## 2020-08-20 ENCOUNTER — Telehealth (INDEPENDENT_AMBULATORY_CARE_PROVIDER_SITE_OTHER): Payer: Medicare Other | Admitting: Cardiology

## 2020-08-20 ENCOUNTER — Other Ambulatory Visit: Payer: Self-pay

## 2020-08-20 VITALS — BP 103/44 | HR 62 | Ht 65.0 in | Wt 111.0 lb

## 2020-08-20 DIAGNOSIS — I6350 Cerebral infarction due to unspecified occlusion or stenosis of unspecified cerebral artery: Secondary | ICD-10-CM

## 2020-08-20 DIAGNOSIS — N183 Chronic kidney disease, stage 3 unspecified: Secondary | ICD-10-CM

## 2020-08-20 DIAGNOSIS — I10 Essential (primary) hypertension: Secondary | ICD-10-CM | POA: Diagnosis not present

## 2020-08-20 DIAGNOSIS — Z952 Presence of prosthetic heart valve: Secondary | ICD-10-CM

## 2020-08-20 DIAGNOSIS — Z951 Presence of aortocoronary bypass graft: Secondary | ICD-10-CM | POA: Diagnosis not present

## 2020-08-20 DIAGNOSIS — R42 Dizziness and giddiness: Secondary | ICD-10-CM | POA: Diagnosis not present

## 2020-08-20 DIAGNOSIS — I255 Ischemic cardiomyopathy: Secondary | ICD-10-CM | POA: Diagnosis not present

## 2020-08-20 LAB — BASIC METABOLIC PANEL
BUN/Creatinine Ratio: 15 (ref 12–28)
BUN: 19 mg/dL (ref 10–36)
CO2: 30 mmol/L — ABNORMAL HIGH (ref 20–29)
Calcium: 9.3 mg/dL (ref 8.7–10.3)
Chloride: 102 mmol/L (ref 96–106)
Creatinine, Ser: 1.29 mg/dL — ABNORMAL HIGH (ref 0.57–1.00)
GFR calc Af Amer: 42 mL/min/{1.73_m2} — ABNORMAL LOW (ref >59)
GFR calc non Af Amer: 36 mL/min/{1.73_m2} — ABNORMAL LOW (ref >59)
Glucose: 85 mg/dL (ref 65–99)
Potassium: 4 mmol/L (ref 3.5–5.2)
Sodium: 146 mmol/L — ABNORMAL HIGH (ref 134–144)

## 2020-08-20 MED ORDER — CARVEDILOL 3.125 MG PO TABS
3.1250 mg | ORAL_TABLET | Freq: Two times a day (BID) | ORAL | 3 refills | Status: DC
Start: 2020-08-20 — End: 2020-09-01

## 2020-08-20 NOTE — Progress Notes (Signed)
Virtual Visit via Telephone Note   This visit type was conducted due to national recommendations for restrictions regarding the COVID-19 Pandemic (e.g. social distancing) in an effort to limit this patient's exposure and mitigate transmission in our community.  Due to her co-morbid illnesses, this patient is at least at moderate risk for complications without adequate follow up.  This format is felt to be most appropriate for this patient at this time.  The patient did not have access to video technology/had technical difficulties with video requiring transitioning to audio format only (telephone).  All issues noted in this document were discussed and addressed.  No physical exam could be performed with this format.  Please refer to the patient's chart for her  consent to telehealth for [facility].    Date:  08/20/2020   ID:  [name], DOB [id number], MRN [id number] The patient was identified using 2 identifiers.  Patient Location: Home Provider Location: Home Office  PCP:  Oval Linsey, MD  Cardiologist:  [name], MD  Electrophysiologist:  None   Evaluation Performed:  Follow-Up Visit  Chief Complaint:  Dizziness   History of Present Illness:    [name] is a pleasnt 86 y.o. female with a history of coronary disease and aortic stenosis.  She had CABG and mechanical AVR in 1997.  Last year she was admitted with heart failure, her ejection fraction was found to be 25 to 30%.  The plan is for conservative therapy.  Other medical issues include chronic renal insufficiency stage IIIb, chronic anemia, and COPD.  Patient was contacted today for routine follow-up.  Her daughter was present during the call.  Patient's main complaint is dizziness when she gets up and some dyspnea on exertion which appears to be chronic.  She has been taking furosemide 40 mg a day unless her weight goes up 2 pounds and then she takes an extra 40 mg of furosemide.  She is also on  carvedilol 6.25 twice daily and spironolactone 12.5 mg a day.  A BM P was drawn 08/19/2020, her BUN was 19, creatinine 1.29 with a potassium of 4.  In reviewing her records she has a tendency towards bradycardia.  Recent blood pressures have been close to 100 systolic at home.  The patient does not have symptoms concerning for COVID-19 infection (fever, chills, cough, or new shortness of breath).    Past Medical History:  Diagnosis Date  . Anemia   . Anxiety   . Arthritis   . Blood transfusion without reported diagnosis   . Carotid artery stenosis    Without infarction  . Cataract   . Chronic combined systolic (congestive) and diastolic (congestive) heart failure (HCC)   . COPD (chronic obstructive pulmonary disease) (HCC)   . Coronary artery disease   . Heart valve replaced by other means   . Hypercholesterolemia    Pure  . Hypertension    Unspecified  . LBBB (left bundle branch block)   . Macular degeneration (senile) of retina, unspecified   . On home O2   . Postsurgical aortocoronary bypass status   . Stroke (HCC)   . Transient global amnesia   . Unspecified hereditary and idiopathic peripheral neuropathy   . Unspecified vitamin D deficiency    Past Surgical History:  Procedure Laterality Date  . AORTIC VALVE REPLACEMENT    . APPENDECTOMY    . CORONARY ARTERY BYPASS GRAFT    . HIP PINNING,CANNULATED Right 07/18/2018   Procedure: RIGHT CANNULATED HIP  PINNING;  Surgeon: Myrene Galas, MD;  Location: [facility] OR;  Service: Orthopedics;  Laterality: Right;  . TONSILLECTOMY       No outpatient medications have been marked as taking for the 08/20/20 encounter (Video Visit) with Abelino Derrick, PA-C.     Allergies:   Codeine and Penicillins   Social History   Tobacco Use  . Smoking status: Former Smoker    Packs/day: 0.50    Types: Cigarettes    Quit date: 11/30/1991    Years since quitting: 28.7  . Smokeless tobacco: Never Used  . Tobacco comment: Tobacco use-no  Substance  Use Topics  . Alcohol use: No  . Drug use: No     Family Hx: The patient's family history includes COPD in her sister; Heart attack in her father; Heart disease in her father; Hypertension in her mother; Neuropathy in her brother; Stroke in her father, mother, and sister.  ROS:   Please see the history of present illness.    All other systems reviewed and are negative.   Prior CV studies:   The following studies were reviewed today:  Echo 01/22/2020- IMPRESSIONS   1. The left ventricle has severely reduced systolic function, with an  ejection fraction of 25-30%. The cavity size was mildly dilated. Left  ventricular diastolic Doppler parameters are consistent with  pseudonormalization. Elevated mean left atrial  pressure Left ventricular diffuse hypokinesis.  2. The right ventricle has mildly reduced systolic function. The cavity  was normal. There is no increase in right ventricular wall thickness.  Right ventricular systolic pressure is mildly elevated with an estimated  pressure of 38.5 mmHg.  3. Left atrial size was severely dilated.  4. Right atrial size was mildly dilated.  5. The mitral valve is abnormal. Moderate thickening of the mitral valve  leaflet. There is moderate mitral annular calcification present. Mitral  valve regurgitation is moderate by color flow Doppler.  6. The tricuspid valve is grossly normal.  7. Aortic valve regurgitation is moderate by color flow Doppler.  8. Severe global reduction in LV systolic function; mild LVE; moderate  diastolic dysfunction; mild RV dysfunction; biatrial enlargement; s/p AVR  with mean gradient 15 mmHg and moderate AI; moderate MR; mild TR with  mildly elevated pulmonary pressure.   Labs/Other Tests and Data Reviewed:    EKG:  An ECG dated 02/05/2020 was personally reviewed today and demonstrated:  NSR, LBBB, HR 78  Recent Labs: 10/30/2019: ALT 33 02/09/2020: B Natriuretic Peptide 386.0; Hemoglobin 9.8;  Magnesium 2.4; Platelets 213 08/19/2020: BUN 19; Creatinine, Ser 1.29; Potassium 4.0; Sodium 146   Recent Lipid Panel Lab Results  Component Value Date/Time   CHOL 141 11/22/2019 12:00 AM   TRIG 66 11/22/2019 12:00 AM   HDL 55 11/22/2019 12:00 AM   CHOLHDL 2.7 07/03/2018 07:00 AM   LDLCALC 73 11/22/2019 12:00 AM    Wt Readings from Last 3 Encounters:  08/20/20 111 lb (50.3 kg)  06/23/20 115 lb 12.8 oz (52.5 kg)  06/11/20 116 lb (52.6 kg)     Objective:    Vital Signs:  BP (!) 103/44   Pulse 62   Ht 5\' 5"  (1.651 m)   Wt 111 lb (50.3 kg)   BMI 18.47 kg/m    VITAL SIGNS:  reviewed  ASSESSMENT & PLAN:    Dizziness- Possible secondary to orthostatic B/P- try decreasing coreg to 3.125 mg BID.  CABG- H/O CABG in 1997- cath done 2011- medical rx  AVR- 1997- echo march 2020-  showed moderate AR  ICM- EF 25-30%. Admitted with CHF march 2021- her weights appear to be stable.   CRI-3 GFR 36  Plan:  Decrease coreg to 3.125 mg BID.  Keep f/u with Dr Jens Som as scheduled.  COVID-19 Education: The signs and symptoms of COVID-19 were discussed with the patient and how to seek care for testing (follow up with PCP or arrange E-visit).  The importance of social distancing was discussed today.  Time:   Today, I have spent 20 minutes with the patient with telehealth technology discussing the above problems.     Medication Adjustments/Labs and Tests Ordered: Current medicines are reviewed at length with the patient today.  Concerns regarding medicines are outlined above.   Tests Ordered: No orders of the defined types were placed in this encounter.   Medication Changes: No orders of the defined types were placed in this encounter.   Follow Up:  In Person with Dr Jens Som in [place] as scheduled.  Signed, Corine Shelter, PA-C  08/20/2020 1:45 PM    [facility]

## 2020-08-20 NOTE — Patient Instructions (Addendum)
Medication Instructions:  DECREASE- Carvedilol 3.15mg  by mouth twice daily  *If you need a refill on your cardiac medications before your next appointment, please call your pharmacy*   Lab Work: None Ordered   Follow-Up: At [facility], you and your health needs are our priority.  As part of our continuing mission to provide you with exceptional heart care, we have created designated Provider Care Teams.  These Care Teams include your primary Cardiologist (physician) and Advanced Practice Providers (APPs -  Physician Assistants and Nurse Practitioners) who all work together to provide you with the care you need, when you need it.  We recommend signing up for the patient portal called "MyChart".  Sign up information is provided on this After Visit Summary.  MyChart is used to connect with patients for Virtual Visits (Telemedicine).  Patients are able to view lab/test results, encounter notes, upcoming appointments, etc.  Non-urgent messages can be sent to your provider as well.   To learn more about what you can do with MyChart, go to [email or web address].    Your next appointment:   Keep appointment with Dr Jens Som on January 25th 10:00 am  The format for your next appointment:   In Person  Provider:   [name], MD

## 2020-08-26 DIAGNOSIS — H838X3 Other specified diseases of inner ear, bilateral: Secondary | ICD-10-CM | POA: Diagnosis not present

## 2020-08-26 DIAGNOSIS — H903 Sensorineural hearing loss, bilateral: Secondary | ICD-10-CM | POA: Diagnosis not present

## 2020-08-26 DIAGNOSIS — H6121 Impacted cerumen, right ear: Secondary | ICD-10-CM | POA: Diagnosis not present

## 2020-08-28 ENCOUNTER — Ambulatory Visit (INDEPENDENT_AMBULATORY_CARE_PROVIDER_SITE_OTHER): Payer: Medicare Other | Admitting: Internal Medicine

## 2020-08-28 DIAGNOSIS — I6350 Cerebral infarction due to unspecified occlusion or stenosis of unspecified cerebral artery: Secondary | ICD-10-CM

## 2020-08-28 DIAGNOSIS — I359 Nonrheumatic aortic valve disorder, unspecified: Secondary | ICD-10-CM

## 2020-08-28 DIAGNOSIS — Z952 Presence of prosthetic heart valve: Secondary | ICD-10-CM

## 2020-08-28 DIAGNOSIS — Z5181 Encounter for therapeutic drug level monitoring: Secondary | ICD-10-CM | POA: Diagnosis not present

## 2020-08-28 LAB — POCT INR: INR: 3.4 — AB (ref 2.0–3.0)

## 2020-09-01 ENCOUNTER — Telehealth: Payer: Self-pay | Admitting: Cardiology

## 2020-09-01 MED ORDER — CARVEDILOL 3.125 MG PO TABS
3.1250 mg | ORAL_TABLET | Freq: Two times a day (BID) | ORAL | 3 refills | Status: DC
Start: 2020-09-01 — End: 2021-04-30

## 2020-09-01 NOTE — Telephone Encounter (Signed)
Pt had a virtual visit with Colleen Shelter PA-C on 08/20/20.  At that visit, [name] decreased her carvedilol to taking 3.125 mg po bid, for dizziness, possibly secondary to orthostatic hypotension.  [name] then advised the pt to keep her follow-up appt as planned with Dr. Jens Simpson.  Pts daughter (on [place]) is now calling in to ask if Dr. Jens Simpson and [name] agree with this dose change.   Daughter states she would like reassurance from Dr. Jens Simpson that the dose decrease in coreg is correct.  Will route this message to Dr. Jens Simpson and [name] to further review, clarify if dosing of coreg is correct, and follow-up with the pts daughter thereafter. Below is assessment and plan from [facility] office visit note with the pt from 10/6.    ASSESSMENT & PLAN:    Dizziness- Possible secondary to orthostatic B/P- try decreasing coreg to 3.125 mg BID.  CABG- H/O CABG in 1997- cath done 2011- medical rx  AVR- 1997- echo march 2020- showed moderate AR  ICM- EF 25-30%. Admitted with CHF march 2021- her weights appear to be stable.   CRI-3 GFR 36  Plan:  Decrease coreg to 3.125 mg BID.  Keep f/u with Dr [name] as scheduled.

## 2020-09-01 NOTE — Telephone Encounter (Signed)
    Pt c/o medication issue:  1. Name of Medication:   carvedilol (COREG) 3.125 MG tablet    2. How are you currently taking this medication (dosage and times per day)? Take 1 tablet (3.125 mg total) by mouth 2 (two) times daily with a meal.  3. Are you having a reaction (difficulty breathing--STAT)?   4. What is your medication issue? [name] said, during pt's virtual appt with Franky Macho he changed his carvedilol taking half a tablet in the morning and another half a tablet at night. She wanted to check in with Dr. Jens Som if that's ok.

## 2020-09-01 NOTE — Telephone Encounter (Signed)
Agree with decreased carvedilol to 3.125 mg twice daily. [name]

## 2020-09-01 NOTE — Telephone Encounter (Signed)
Spoke with pt daughter, Aware of dr crenshaw's recommendations. New script sent to the pharmacy. 

## 2020-09-03 ENCOUNTER — Telehealth: Payer: Self-pay

## 2020-09-03 NOTE — Telephone Encounter (Signed)
915 am.  Phone call made to patient to schedule an appointment next week.  Patient answered the phone and requested that I speak with her caregiver who has the calendar of events.  Spoke with patient's caregiver who reports patient is able to have a visit on Tuesday at 130 pm.

## 2020-09-08 ENCOUNTER — Inpatient Hospital Stay (HOSPITAL_COMMUNITY)
Admission: EM | Admit: 2020-09-08 | Discharge: 2020-09-20 | DRG: 445 | Disposition: A | Discharge status: Home Health | Payer: Medicare Other | Attending: Family Medicine | Admitting: Family Medicine

## 2020-09-08 ENCOUNTER — Encounter (HOSPITAL_COMMUNITY): Payer: Self-pay | Admitting: Emergency Medicine

## 2020-09-08 ENCOUNTER — Other Ambulatory Visit: Payer: Self-pay

## 2020-09-08 ENCOUNTER — Emergency Department (HOSPITAL_COMMUNITY): Payer: Medicare Other

## 2020-09-08 DIAGNOSIS — Z8673 Personal history of transient ischemic attack (TIA), and cerebral infarction without residual deficits: Secondary | ICD-10-CM

## 2020-09-08 DIAGNOSIS — I10 Essential (primary) hypertension: Secondary | ICD-10-CM | POA: Diagnosis present

## 2020-09-08 DIAGNOSIS — Z8616 Personal history of COVID-19: Secondary | ICD-10-CM

## 2020-09-08 DIAGNOSIS — Z79899 Other long term (current) drug therapy: Secondary | ICD-10-CM

## 2020-09-08 DIAGNOSIS — R109 Unspecified abdominal pain: Secondary | ICD-10-CM | POA: Diagnosis not present

## 2020-09-08 DIAGNOSIS — Z880 Allergy status to penicillin: Secondary | ICD-10-CM

## 2020-09-08 DIAGNOSIS — G4700 Insomnia, unspecified: Secondary | ICD-10-CM | POA: Diagnosis present

## 2020-09-08 DIAGNOSIS — K8020 Calculus of gallbladder without cholecystitis without obstruction: Secondary | ICD-10-CM | POA: Diagnosis not present

## 2020-09-08 DIAGNOSIS — N183 Chronic kidney disease, stage 3 unspecified: Secondary | ICD-10-CM | POA: Diagnosis not present

## 2020-09-08 DIAGNOSIS — N1832 Chronic kidney disease, stage 3b: Secondary | ICD-10-CM | POA: Diagnosis present

## 2020-09-08 DIAGNOSIS — Z7901 Long term (current) use of anticoagulants: Secondary | ICD-10-CM

## 2020-09-08 DIAGNOSIS — I2510 Atherosclerotic heart disease of native coronary artery without angina pectoris: Secondary | ICD-10-CM | POA: Diagnosis present

## 2020-09-08 DIAGNOSIS — F0390 Unspecified dementia without behavioral disturbance: Secondary | ICD-10-CM | POA: Diagnosis present

## 2020-09-08 DIAGNOSIS — J441 Chronic obstructive pulmonary disease with (acute) exacerbation: Secondary | ICD-10-CM | POA: Diagnosis not present

## 2020-09-08 DIAGNOSIS — Z8249 Family history of ischemic heart disease and other diseases of the circulatory system: Secondary | ICD-10-CM

## 2020-09-08 DIAGNOSIS — I255 Ischemic cardiomyopathy: Secondary | ICD-10-CM | POA: Diagnosis not present

## 2020-09-08 DIAGNOSIS — D631 Anemia in chronic kidney disease: Secondary | ICD-10-CM | POA: Diagnosis present

## 2020-09-08 DIAGNOSIS — I447 Left bundle-branch block, unspecified: Secondary | ICD-10-CM | POA: Diagnosis not present

## 2020-09-08 DIAGNOSIS — Z885 Allergy status to narcotic agent status: Secondary | ICD-10-CM

## 2020-09-08 DIAGNOSIS — J449 Chronic obstructive pulmonary disease, unspecified: Secondary | ICD-10-CM | POA: Diagnosis present

## 2020-09-08 DIAGNOSIS — Z515 Encounter for palliative care: Secondary | ICD-10-CM | POA: Diagnosis not present

## 2020-09-08 DIAGNOSIS — R042 Hemoptysis: Secondary | ICD-10-CM | POA: Diagnosis not present

## 2020-09-08 DIAGNOSIS — F419 Anxiety disorder, unspecified: Secondary | ICD-10-CM | POA: Diagnosis present

## 2020-09-08 DIAGNOSIS — E785 Hyperlipidemia, unspecified: Secondary | ICD-10-CM | POA: Diagnosis present

## 2020-09-08 DIAGNOSIS — K649 Unspecified hemorrhoids: Secondary | ICD-10-CM | POA: Diagnosis present

## 2020-09-08 DIAGNOSIS — N179 Acute kidney failure, unspecified: Secondary | ICD-10-CM | POA: Diagnosis not present

## 2020-09-08 DIAGNOSIS — K8050 Calculus of bile duct without cholangitis or cholecystitis without obstruction: Principal | ICD-10-CM | POA: Diagnosis present

## 2020-09-08 DIAGNOSIS — H353212 Exudative age-related macular degeneration, right eye, with inactive choroidal neovascularization: Secondary | ICD-10-CM | POA: Diagnosis present

## 2020-09-08 DIAGNOSIS — H9193 Unspecified hearing loss, bilateral: Secondary | ICD-10-CM | POA: Diagnosis present

## 2020-09-08 DIAGNOSIS — I5042 Chronic combined systolic (congestive) and diastolic (congestive) heart failure: Secondary | ICD-10-CM | POA: Diagnosis present

## 2020-09-08 DIAGNOSIS — I714 Abdominal aortic aneurysm, without rupture: Secondary | ICD-10-CM | POA: Diagnosis present

## 2020-09-08 DIAGNOSIS — R778 Other specified abnormalities of plasma proteins: Secondary | ICD-10-CM | POA: Diagnosis present

## 2020-09-08 DIAGNOSIS — E559 Vitamin D deficiency, unspecified: Secondary | ICD-10-CM | POA: Diagnosis present

## 2020-09-08 DIAGNOSIS — E876 Hypokalemia: Secondary | ICD-10-CM | POA: Diagnosis present

## 2020-09-08 DIAGNOSIS — H353221 Exudative age-related macular degeneration, left eye, with active choroidal neovascularization: Secondary | ICD-10-CM | POA: Diagnosis present

## 2020-09-08 DIAGNOSIS — J90 Pleural effusion, not elsewhere classified: Secondary | ICD-10-CM | POA: Diagnosis not present

## 2020-09-08 DIAGNOSIS — I130 Hypertensive heart and chronic kidney disease with heart failure and stage 1 through stage 4 chronic kidney disease, or unspecified chronic kidney disease: Secondary | ICD-10-CM | POA: Diagnosis not present

## 2020-09-08 DIAGNOSIS — K760 Fatty (change of) liver, not elsewhere classified: Secondary | ICD-10-CM | POA: Diagnosis not present

## 2020-09-08 DIAGNOSIS — K4020 Bilateral inguinal hernia, without obstruction or gangrene, not specified as recurrent: Secondary | ICD-10-CM | POA: Diagnosis present

## 2020-09-08 DIAGNOSIS — R1011 Right upper quadrant pain: Secondary | ICD-10-CM | POA: Diagnosis not present

## 2020-09-08 DIAGNOSIS — Z20822 Contact with and (suspected) exposure to covid-19: Secondary | ICD-10-CM | POA: Diagnosis present

## 2020-09-08 DIAGNOSIS — R079 Chest pain, unspecified: Secondary | ICD-10-CM | POA: Diagnosis not present

## 2020-09-08 DIAGNOSIS — I340 Nonrheumatic mitral (valve) insufficiency: Secondary | ICD-10-CM | POA: Diagnosis not present

## 2020-09-08 DIAGNOSIS — Z823 Family history of stroke: Secondary | ICD-10-CM

## 2020-09-08 DIAGNOSIS — Z01810 Encounter for preprocedural cardiovascular examination: Secondary | ICD-10-CM | POA: Diagnosis not present

## 2020-09-08 DIAGNOSIS — R0603 Acute respiratory distress: Secondary | ICD-10-CM

## 2020-09-08 DIAGNOSIS — Z79891 Long term (current) use of opiate analgesic: Secondary | ICD-10-CM

## 2020-09-08 DIAGNOSIS — Z87891 Personal history of nicotine dependence: Secondary | ICD-10-CM

## 2020-09-08 DIAGNOSIS — Z825 Family history of asthma and other chronic lower respiratory diseases: Secondary | ICD-10-CM

## 2020-09-08 DIAGNOSIS — Z66 Do not resuscitate: Secondary | ICD-10-CM | POA: Diagnosis present

## 2020-09-08 DIAGNOSIS — Z9981 Dependence on supplemental oxygen: Secondary | ICD-10-CM

## 2020-09-08 DIAGNOSIS — R5382 Chronic fatigue, unspecified: Secondary | ICD-10-CM | POA: Diagnosis not present

## 2020-09-08 DIAGNOSIS — D509 Iron deficiency anemia, unspecified: Secondary | ICD-10-CM | POA: Diagnosis present

## 2020-09-08 DIAGNOSIS — R0902 Hypoxemia: Secondary | ICD-10-CM | POA: Diagnosis present

## 2020-09-08 DIAGNOSIS — Z952 Presence of prosthetic heart valve: Secondary | ICD-10-CM | POA: Diagnosis not present

## 2020-09-08 DIAGNOSIS — G609 Hereditary and idiopathic neuropathy, unspecified: Secondary | ICD-10-CM | POA: Diagnosis present

## 2020-09-08 DIAGNOSIS — D539 Nutritional anemia, unspecified: Secondary | ICD-10-CM | POA: Diagnosis not present

## 2020-09-08 DIAGNOSIS — Z951 Presence of aortocoronary bypass graft: Secondary | ICD-10-CM

## 2020-09-08 DIAGNOSIS — R791 Abnormal coagulation profile: Secondary | ICD-10-CM | POA: Diagnosis present

## 2020-09-08 LAB — COMPREHENSIVE METABOLIC PANEL
ALT: 32 U/L (ref 0–44)
AST: 82 U/L — ABNORMAL HIGH (ref 15–41)
Albumin: 4.1 g/dL (ref 3.5–5.0)
Alkaline Phosphatase: 72 U/L (ref 38–126)
Anion gap: 11 (ref 5–15)
BUN: 24 mg/dL — ABNORMAL HIGH (ref 8–23)
CO2: 31 mmol/L (ref 22–32)
Calcium: 9.4 mg/dL (ref 8.9–10.3)
Chloride: 100 mmol/L (ref 98–111)
Creatinine, Ser: 1.21 mg/dL — ABNORMAL HIGH (ref 0.44–1.00)
GFR, Estimated: 42 mL/min — ABNORMAL LOW (ref >60)
Glucose, Bld: 112 mg/dL — ABNORMAL HIGH (ref 70–99)
Potassium: 3.8 mmol/L (ref 3.5–5.1)
Sodium: 142 mmol/L (ref 135–145)
Total Bilirubin: 0.9 mg/dL (ref 0.3–1.2)
Total Protein: 7.1 g/dL (ref 6.5–8.1)

## 2020-09-08 LAB — CBC WITH DIFFERENTIAL/PLATELET
Abs Immature Granulocytes: 0.01 10*3/uL (ref 0.00–0.07)
Basophils Absolute: 0 10*3/uL (ref 0.0–0.1)
Basophils Relative: 0 %
Eosinophils Absolute: 0.4 10*3/uL (ref 0.0–0.5)
Eosinophils Relative: 5 %
HCT: 32.8 % — ABNORMAL LOW (ref 36.0–46.0)
Hemoglobin: 10.5 g/dL — ABNORMAL LOW (ref 12.0–15.0)
Immature Granulocytes: 0 %
Lymphocytes Relative: 10 %
Lymphs Abs: 0.9 10*3/uL (ref 0.7–4.0)
MCH: 31.3 pg (ref 26.0–34.0)
MCHC: 32 g/dL (ref 30.0–36.0)
MCV: 97.6 fL (ref 80.0–100.0)
Monocytes Absolute: 0.8 10*3/uL (ref 0.1–1.0)
Monocytes Relative: 9 %
Neutro Abs: 6.4 10*3/uL (ref 1.7–7.7)
Neutrophils Relative %: 76 %
Platelets: 175 10*3/uL (ref 150–400)
RBC: 3.36 MIL/uL — ABNORMAL LOW (ref 3.87–5.11)
RDW: 13.2 % (ref 11.5–15.5)
WBC: 8.5 10*3/uL (ref 4.0–10.5)
nRBC: 0 % (ref 0.0–0.2)

## 2020-09-08 LAB — PROTIME-INR
INR: 2.5 — ABNORMAL HIGH (ref 0.8–1.2)
Prothrombin Time: 26.4 seconds — ABNORMAL HIGH (ref 11.4–15.2)

## 2020-09-08 LAB — RESPIRATORY PANEL BY RT PCR (FLU A&B, COVID)
Influenza A by PCR: NEGATIVE
Influenza B by PCR: NEGATIVE
SARS Coronavirus 2 by RT PCR: NEGATIVE

## 2020-09-08 LAB — URINALYSIS, ROUTINE W REFLEX MICROSCOPIC
Bilirubin Urine: NEGATIVE
Glucose, UA: NEGATIVE mg/dL
Hgb urine dipstick: NEGATIVE
Ketones, ur: NEGATIVE mg/dL
Leukocytes,Ua: NEGATIVE
Nitrite: NEGATIVE
Protein, ur: NEGATIVE mg/dL
Specific Gravity, Urine: 1.004 — ABNORMAL LOW (ref 1.005–1.030)
pH: 7 (ref 5.0–8.0)

## 2020-09-08 LAB — LIPASE, BLOOD: Lipase: 49 U/L (ref 11–51)

## 2020-09-08 LAB — TROPONIN I (HIGH SENSITIVITY)
Troponin I (High Sensitivity): 44 ng/L — ABNORMAL HIGH (ref <18)
Troponin I (High Sensitivity): 43 ng/L — ABNORMAL HIGH (ref <18)

## 2020-09-08 MED ORDER — DEXTROSE 50 % IV SOLN: 1.0000 | Freq: Once | INTRAVENOUS | Status: DC

## 2020-09-08 MED ORDER — DONEPEZIL HCL 5 MG PO TABS
10.0000 mg | ORAL_TABLET | Freq: Every day | ORAL | Status: DC
  Administered 2020-09-09 – 2020-09-20 (×11): 10 mg via ORAL
  Filled 2020-09-08: qty 2
  Filled 2020-09-08: qty 1
  Filled 2020-09-08 (×7): qty 2
  Filled 2020-09-08: qty 1
  Filled 2020-09-08 (×3): qty 2

## 2020-09-08 MED ORDER — HEPARIN SODIUM (PORCINE) 5000 UNIT/ML IJ SOLN: 5000.0000 [IU] | Freq: Three times a day (TID) | INTRAMUSCULAR | Status: DC

## 2020-09-08 MED ORDER — CIPROFLOXACIN IN D5W 400 MG/200ML IV SOLN
400.0000 mg | Freq: Two times a day (BID) | INTRAVENOUS | Status: DC
  Administered 2020-09-09: 400 mg via INTRAVENOUS
  Filled 2020-09-08: qty 200

## 2020-09-08 MED ORDER — SODIUM CHLORIDE 0.9 % IV SOLN: INTRAVENOUS | Status: DC

## 2020-09-08 MED ORDER — ONDANSETRON HCL 4 MG/2ML IJ SOLN
4.0000 mg | Freq: Four times a day (QID) | INTRAMUSCULAR | Status: DC | PRN
  Administered 2020-09-10: 4 mg via INTRAVENOUS
  Filled 2020-09-08: qty 2

## 2020-09-08 MED ORDER — IOHEXOL 350 MG/ML SOLN
100.0000 mL | Freq: Once | INTRAVENOUS | Status: AC | PRN
  Administered 2020-09-08: 80 mL via INTRAVENOUS

## 2020-09-08 MED ORDER — ONDANSETRON HCL 4 MG PO TABS: 4.0000 mg | ORAL_TABLET | Freq: Four times a day (QID) | ORAL | Status: DC | PRN

## 2020-09-08 MED ORDER — TRAZODONE HCL 50 MG PO TABS: 50.0000 mg | ORAL_TABLET | Freq: Every evening | ORAL | Status: DC | PRN

## 2020-09-08 MED ORDER — ALPRAZOLAM 0.25 MG PO TABS
0.1250 mg | ORAL_TABLET | Freq: Every evening | ORAL | Status: DC | PRN
  Administered 2020-09-09 – 2020-09-12 (×3): 0.25 mg via ORAL
  Filled 2020-09-08 (×4): qty 1

## 2020-09-08 MED ORDER — ACETAMINOPHEN 650 MG RE SUPP: 650.0000 mg | Freq: Four times a day (QID) | RECTAL | Status: DC | PRN

## 2020-09-08 MED ORDER — SPIRONOLACTONE 12.5 MG HALF TABLET
12.5000 mg | ORAL_TABLET | Freq: Every day | ORAL | Status: DC
  Administered 2020-09-09 – 2020-09-20 (×11): 12.5 mg via ORAL
  Filled 2020-09-08 (×16): qty 1

## 2020-09-08 MED ORDER — ACETAMINOPHEN 325 MG PO TABS
650.0000 mg | ORAL_TABLET | Freq: Four times a day (QID) | ORAL | Status: DC | PRN
  Administered 2020-09-09: 650 mg via ORAL
  Filled 2020-09-08: qty 2

## 2020-09-08 MED ORDER — FERROUS SULFATE 325 (65 FE) MG PO TABS
325.0000 mg | ORAL_TABLET | Freq: Every day | ORAL | Status: DC
  Administered 2020-09-09 – 2020-09-10 (×2): 325 mg via ORAL
  Filled 2020-09-08 (×2): qty 1

## 2020-09-08 MED ORDER — CIPROFLOXACIN IN D5W 400 MG/200ML IV SOLN
400.0000 mg | Freq: Once | INTRAVENOUS | Status: AC
  Administered 2020-09-08: 400 mg via INTRAVENOUS
  Filled 2020-09-08: qty 200

## 2020-09-08 MED ORDER — CARVEDILOL 3.125 MG PO TABS
3.1250 mg | ORAL_TABLET | Freq: Two times a day (BID) | ORAL | Status: DC
  Administered 2020-09-09 – 2020-09-11 (×6): 3.125 mg via ORAL
  Filled 2020-09-08 (×6): qty 1

## 2020-09-08 MED ORDER — UMECLIDINIUM-VILANTEROL 62.5-25 MCG/INH IN AEPB
1.0000 | INHALATION_SPRAY | Freq: Every day | RESPIRATORY_TRACT | Status: DC
  Administered 2020-09-10 – 2020-09-20 (×11): 1 via RESPIRATORY_TRACT
  Filled 2020-09-08 (×2): qty 14

## 2020-09-08 MED ORDER — ATORVASTATIN CALCIUM 40 MG PO TABS
40.0000 mg | ORAL_TABLET | Freq: Every day | ORAL | Status: DC
  Administered 2020-09-09: 40 mg via ORAL
  Filled 2020-09-08: qty 1

## 2020-09-08 MED ORDER — LIDOCAINE VISCOUS HCL 2 % MT SOLN
15.0000 mL | Freq: Once | OROMUCOSAL | Status: AC
  Administered 2020-09-08: 15 mL via ORAL
  Filled 2020-09-08: qty 15

## 2020-09-08 MED ORDER — ALUM & MAG HYDROXIDE-SIMETH 200-200-20 MG/5ML PO SUSP
30.0000 mL | Freq: Once | ORAL | Status: AC
  Administered 2020-09-08: 30 mL via ORAL
  Filled 2020-09-08: qty 30

## 2020-09-08 MED ORDER — PREGABALIN 75 MG PO CAPS
75.0000 mg | ORAL_CAPSULE | Freq: Every day | ORAL | Status: DC
  Administered 2020-09-09 – 2020-09-20 (×11): 75 mg via ORAL
  Filled 2020-09-08 (×11): qty 1

## 2020-09-08 NOTE — H&P (Addendum)
TRH H&P    Patient Demographics:    [name], is a 86 y.o. female  MRN: [id number]  DOB - 28-Oct-1926  Admit Date - 09/08/2020  Referring MD/NP/PA: Particia Nearing  Outpatient Primary MD for the patient is Oval Linsey, MD  Patient coming from: Home  Chief complaint-abdominal pain   HPI:    [name]  is a 86 y.o. female, with history of vitamin D deficiency, transient global amnesia, stroke, COPD on 4 L nasal cannula at home, left bundle branch block, hypertension, hyperlipidemia, coronary artery disease, chronic combined systolic and diastolic heart failure, carotid artery stenosis, mechanical heart valve, and more presents to the ED with a chief complaint of right upper quadrant abdominal pain.  Patient reports that 2 AM 09/08/2020 she had sudden onset of an aching pain.  Pain was intermittent.  She had associated nausea but no vomiting.  She reports that the pain radiated through to her back.  She reports that she has been having diarrhea for 2 weeks.  She does have history of constipation and takes stool softeners.  Last normal meal was dinner on 09/07/2020.  She had fried shrimp, fried fish, coleslaw, harsh puppies, fries.  Patient rates her pain at 2 AM with severe, it decreases to mild, and intermittently increases back to severe.  Patient has not had pain like this before.   Patient and daughter both want everybody to know that she is in palliative care.  She is in palliative care secondary to COPD and CHF.  She has been in palliative care since March 2020 per daughter report.  Patient is a DNR and has paperwork with her. Patient is fully vaccinated for Covid.  She is a remote former smoker.  She does not do any illicit drugs.  She does not drink alcohol.  In the ED Temp 97.5, heart rate 56-72, respiratory rate 18-23, blood pressure 187/65, satting at 100% on 3 L nasal cannula White blood cell count  8.5, hemoglobin 10.5 Chemistry panel shows a BUN to creatinine ratio of 24:1.21 AST is mildly elevated at 82 Downtrending Trope from 44-43 INR is 2.5 -patient is on warfarin for mechanical valve GEN surge and GI consulted plan for ERCP tomorrow Patient has penicillin allergy, Cipro started per GEN surge    Review of systems:    In addition to the HPI above,  No Fever-chills, No Headache, No changes with Vision or hearing, No problems swallowing food or Liquids, No Chest pain, Cough or Shortness of Breath, Positive for abdominal pain, nausea, constipation No Blood in stool or Urine, No dysuria, No new skin rashes or bruises, No new joints pains-aches,  No new weakness, tingling, numbness in any extremity, No recent weight gain or loss, No polyuria, polydypsia or polyphagia, No significant Mental Stressors.  All other systems reviewed and are negative.    Past History of the following :    Past Medical History:  Diagnosis Date  . Anemia   . Anxiety   . Arthritis   . Blood transfusion without reported diagnosis   .  Carotid artery stenosis    Without infarction  . Cataract   . Chronic combined systolic (congestive) and diastolic (congestive) heart failure (HCC)   . COPD (chronic obstructive pulmonary disease) (HCC)   . Coronary artery disease   . Heart valve replaced by other means   . Hypercholesterolemia    Pure  . Hypertension    Unspecified  . LBBB (left bundle branch block)   . Macular degeneration (senile) of retina, unspecified   . On home O2   . Postsurgical aortocoronary bypass status   . Stroke (HCC)   . Transient global amnesia   . Unspecified hereditary and idiopathic peripheral neuropathy   . Unspecified vitamin D deficiency       Past Surgical History:  Procedure Laterality Date  . AORTIC VALVE REPLACEMENT    . APPENDECTOMY    . CORONARY ARTERY BYPASS GRAFT    . HIP PINNING,CANNULATED Right 07/18/2018   Procedure: RIGHT CANNULATED HIP PINNING;   Surgeon: Myrene Galas, MD;  Location: [facility] OR;  Service: Orthopedics;  Laterality: Right;  . TONSILLECTOMY        Social History:      Social History   Tobacco Use  . Smoking status: Former Smoker    Packs/day: 0.50    Types: Cigarettes    Quit date: 11/30/1991    Years since quitting: 28.7  . Smokeless tobacco: Never Used  . Tobacco comment: Tobacco use-no  Substance Use Topics  . Alcohol use: No       Family History :     Family History  Problem Relation Age of Onset  . Heart attack Father   . Heart disease Father   . Stroke Father   . Stroke Sister   . Hypertension Mother   . Stroke Mother   . Neuropathy Brother   . COPD Sister       Home Medications:   Prior to Admission medications   Medication Sig Start Date End Date Taking? Authorizing Provider  acetaminophen (TYLENOL) 650 MG CR tablet Take 650 mg by mouth every 8 (eight) hours as needed for pain.   Yes [provider]  ALPRAZolam (XANAX) 0.25 MG tablet Take 0.125-0.25 mg by mouth at bedtime as needed for anxiety.    Yes [provider]  ANORO ELLIPTA 62.5-25 MCG/INH AEPB USE 1 INHALATION ONCE DAILY. 07/22/20  Yes Nyoka Cowden, MD  atorvastatin (LIPITOR) 40 MG tablet TAKE ONE TABLET BY MOUTH ONCE DAILY. 04/08/20  Yes Lewayne Bunting, MD  baclofen (LIORESAL) 10 MG tablet Take 10 mg by mouth as needed for muscle spasms.   Yes [provider]  carvedilol (COREG) 3.125 MG tablet Take 1 tablet (3.125 mg total) by mouth 2 (two) times daily with a meal. Patient taking differently: Take 6.25 mg by mouth 2 (two) times daily with a meal. Pt takes 6.5 mg twice a day. 09/01/20  Yes Lewayne Bunting, MD  docusate sodium (COLACE) 100 MG capsule Take 100 mg by mouth every evening.    Yes [provider]  donepezil (ARICEPT) 10 MG tablet TAKE ONE TABLET BY MOUTH ONCE DAILY. Patient taking differently: Take 10 mg by mouth at bedtime.  04/15/20  Yes Corum, Minerva Fester, MD  ergocalciferol  (VITAMIN D2) 50000 UNITS capsule Take 50,000 Units by mouth every Tuesday.    Yes [provider]  ferrous sulfate 325 (65 FE) MG EC tablet Take 325 mg by mouth daily.    Yes [provider]  furosemide (LASIX)  40 MG tablet TAKE ONE TABLET BY MOUTH ONCE DAILY. 04/08/20  Yes Lewayne Bunting, MD  HYDROcodone-acetaminophen (NORCO) 7.5-325 MG tablet Take 1 tablet by mouth every 6 (six) hours as needed for moderate pain.   Yes [provider]  levalbuterol (XOPENEX) 0.63 MG/3ML nebulizer solution USE 1 VIAL VIA NEBULIZER EVERY 8 HOURS AS NEEDED. 06/23/20  Yes Nyoka Cowden, MD  meclizine (ANTIVERT) 25 MG tablet Take 25 mg by mouth 3 (three) times daily as needed for dizziness.   Yes [provider]  Melatonin 3 MG TABS Take 1 tablet by mouth at bedtime.   Yes [provider]  methylPREDNISolone (MEDROL) 4 MG tablet Take 4 mg by mouth 2 (two) times daily as needed.   Yes [provider]  multivitamin-lutein (OCUVITE-LUTEIN) CAPS capsule Take 1 capsule by mouth daily.   Yes [provider]  ondansetron (ZOFRAN) 4 MG tablet Take 4 mg by mouth every 8 (eight) hours as needed for nausea or vomiting.   Yes [provider]  polyethylene glycol (MIRALAX / GLYCOLAX) 17 g packet Take 17 g by mouth daily as needed for moderate constipation.   Yes [provider]  pregabalin (LYRICA) 75 MG capsule TAKE 1 CAPSULE BY MOUTH EVERY OTHER DAY. 04/29/20  Yes Darreld Mclean, MD  spironolactone (ALDACTONE) 25 MG tablet TAKE (1/2) TABLET BY MOUTH DAILY. Patient taking differently: Take 12.5 mg by mouth daily.  12/24/19  Yes Lewayne Bunting, MD  warfarin (COUMADIN) 5 MG tablet TAKE 1/2 TO 1 TABLET ONCE DAILY AS DIRECTED BY COUMADIN CLINIC. Patient taking differently: Take 5 mg by mouth See admin instructions. Take 1/2 tablet on Monday, Wednesday and Friday and 1 whole tablet on Tues,Thursday, Saturday and Sunday. 03/25/20  Yes Lewayne Bunting, MD   zinc sulfate 50 MG CAPS capsule Take 50 mg by mouth 2 (two) times daily.    Yes [provider]  hydrocortisone (ANUSOL-HC) 2.5 % rectal cream Place 1 application rectally 3 (three) times daily as needed for hemorrhoids or anal itching.    [provider]  traZODone (DESYREL) 50 MG tablet Take 0.5-1 tablets (25-50 mg total) by mouth at bedtime as needed for sleep. Patient not taking: Reported on 09/08/2020 11/19/19   Wandra Feinstein, MD     Allergies:     Allergies  Allergen Reactions  . Codeine Nausea And Vomiting  . Penicillins Other (See Comments)    Unknown reaction - listed on MAR Has patient had a PCN reaction causing immediate rash, facial/tongue/throat swelling, SOB or lightheadedness with hypotension: Unknown Has patient had a PCN reaction causing severe rash involving mucus membranes or skin necrosis: Unknown Has patient had a PCN reaction that required hospitalization: Unknown Has patient had a PCN reaction occurring within the last 10 years: Unknown If all of the above answers are "NO", then may proceed with Cephalosporin use.     Physical Exam:   Vitals  Blood pressure (!) 187/65, pulse 72, temperature (!) 97.5 F (36.4 C), temperature source Oral, resp. rate (!) 23, height  (1.575 m), weight 50.3 kg, SpO2 100 %.  1.  General: Lying supine in bed in no acute distress  2. Psychiatric: Irritable, alert and oriented  3. Neurologic: Cranial nerves II through XII intact, moves all 4 extremities voluntarily  4. HEENMT:  Head is atraumatic, normocephalic, pupils reactive to light, neck is supple, trachea is midline, mucous membranes are moist  5. Respiratory : Lungs are clear to auscultation bilaterally  6. Cardiovascular : Heart rate bradycardic, rhythm is regular, no murmurs rubs or gallops  7. Gastrointestinal:  Abdomen is soft, nondistended, nontender to palpation, no masses, negative Murphy sign  8. Skin:  No acute lesions on limited  skin exam  9.Musculoskeletal:  No acute deformities or peripheral edema    Data Review:    CBC Recent Labs  Lab 09/08/20 1300  WBC 8.5  HGB 10.5*  HCT 32.8*  PLT 175  MCV 97.6  MCH 31.3  MCHC 32.0  RDW 13.2  LYMPHSABS 0.9  MONOABS 0.8  EOSABS 0.4  BASOSABS 0.0   ------------------------------------------------------------------------------------------------------------------  Results for orders placed or performed during the hospital encounter of 09/08/20 (from the past 48 hour(s))  CBC with Differential     Status: Abnormal   Collection Time: 09/08/20  1:00 PM  Result Value Ref Range   WBC 8.5 4.0 - 10.5 K/uL   RBC 3.36 (L) 3.87 - 5.11 MIL/uL   Hemoglobin 10.5 (L) 12.0 - 15.0 g/dL   HCT 16.1 (L) 36 - 46 %   MCV 97.6 80.0 - 100.0 fL   MCH 31.3 26.0 - 34.0 pg   MCHC 32.0 30.0 - 36.0 g/dL   RDW 09.6 04.5 - 40.9 %   Platelets 175 150 - 400 K/uL   nRBC 0.0 0.0 - 0.2 %   Neutrophils Relative % 76 %   Neutro Abs 6.4 1.7 - 7.7 K/uL   Lymphocytes Relative 10 %   Lymphs Abs 0.9 0.7 - 4.0 K/uL   Monocytes Relative 9 %   Monocytes Absolute 0.8 0.1 - 1.0 K/uL   Eosinophils Relative 5 %   Eosinophils Absolute 0.4 0.0 - 0.5 K/uL   Basophils Relative 0 %   Basophils Absolute 0.0 0.0 - 0.1 K/uL   Immature Granulocytes 0 %   Abs Immature Granulocytes 0.01 0.00 - 0.07 K/uL    Comment: Performed at [facility], [address] [place], [place] [address]  Comprehensive metabolic panel     Status: Abnormal   Collection Time: 09/08/20  1:00 PM  Result Value Ref Range   Sodium 142 135 - 145 mmol/L   Potassium 3.8 3.5 - 5.1 mmol/L   Chloride 100 98 - 111 mmol/L   CO2 31 22 - 32 mmol/L   Glucose, Bld 112 (H) 70 - 99 mg/dL    Comment: Glucose reference range applies only to samples taken after fasting for at least 8 hours.   BUN 24 (H) 8 - 23 mg/dL   Creatinine, Ser 4.78 (H) 0.44 - 1.00 mg/dL   Calcium 9.4 8.9 - 29.5 mg/dL   Total Protein 7.1 6.5 - 8.1 g/dL   Albumin 4.1 3.5  - 5.0 g/dL   AST 82 (H) 15 - 41 U/L   ALT 32 0 - 44 U/L   Alkaline Phosphatase 72 38 - 126 U/L   Total Bilirubin 0.9 0.3 - 1.2 mg/dL   GFR, Estimated 42 (L) >60 mL/min    Comment: (NOTE) Calculated using the CKD-EPI Creatinine Equation (2021)    Anion gap 11 5 - 15    Comment: Performed at [facility], [address], [place], [place] [address]  Lipase, blood     Status: None   Collection Time: 09/08/20  1:00 PM  Result Value Ref Range   Lipase 49 11 - 51 U/L    Comment: Performed at [facility], [address] [place], [place] [address]  Protime-INR     Status: Abnormal   Collection  Time: 09/08/20  1:00 PM  Result Value Ref Range   Prothrombin Time 26.4 (H) 11.4 - 15.2 seconds   INR 2.5 (H) 0.8 - 1.2    Comment: (NOTE) INR goal varies based on device and disease states. Performed at [facility], [address], [place], [place] [address]   Troponin I (High Sensitivity)     Status: Abnormal   Collection Time: 09/08/20  1:00 PM  Result Value Ref Range   Troponin I (High Sensitivity) 44 (H) <18 ng/L    Comment: (NOTE) Elevated high sensitivity troponin I (hsTnI) values and significant  changes across serial measurements may suggest ACS but many other  chronic and acute conditions are known to elevate hsTnI results.  Refer to the "Links" section for chest pain algorithms and additional  guidance. Performed at [facility], [address] [place], [place] [address]   Urinalysis, Routine w reflex microscopic Urine, Clean Catch     Status: Abnormal   Collection Time: 09/08/20  1:22 PM  Result Value Ref Range   Color, Urine STRAW (A) YELLOW   APPearance CLEAR CLEAR   Specific Gravity, Urine 1.004 (L) 1.005 - 1.030   pH 7.0 5.0 - 8.0   Glucose, UA NEGATIVE NEGATIVE mg/dL   Hgb urine dipstick NEGATIVE NEGATIVE   Bilirubin Urine NEGATIVE NEGATIVE   Ketones, ur NEGATIVE NEGATIVE mg/dL   Protein, ur NEGATIVE NEGATIVE mg/dL   Nitrite NEGATIVE NEGATIVE   Leukocytes,Ua NEGATIVE  NEGATIVE    Comment: Performed at [facility], [address], [place], [place] [address]  Troponin I (High Sensitivity)     Status: Abnormal   Collection Time: 09/08/20  3:24 PM  Result Value Ref Range   Troponin I (High Sensitivity) 43 (H) <18 ng/L    Comment: (NOTE) Elevated high sensitivity troponin I (hsTnI) values and significant  changes across serial measurements may suggest ACS but many other  chronic and acute conditions are known to elevate hsTnI results.  Refer to the "Links" section for chest pain algorithms and additional  guidance. Performed at [facility], [address] [place], [place] [address]     Chemistries  Recent Labs  Lab 09/08/20 1300  NA 142  K 3.8  CL 100  CO2 31  GLUCOSE 112*  BUN 24*  CREATININE 1.21*  CALCIUM 9.4  AST 82*  ALT 32  ALKPHOS 72  BILITOT 0.9   ------------------------------------------------------------------------------------------------------------------  ------------------------------------------------------------------------------------------------------------------ GFR: Estimated Creatinine Clearance: 24 mL/min (A) (by C-G formula based on SCr of 1.21 mg/dL (H)). Liver Function Tests: Recent Labs  Lab 09/08/20 1300  AST 82*  ALT 32  ALKPHOS 72  BILITOT 0.9  PROT 7.1  ALBUMIN 4.1   Recent Labs  Lab 09/08/20 1300  LIPASE 49   No results for input(s): AMMONIA in the last 168 hours. Coagulation Profile: Recent Labs  Lab 09/08/20 1300  INR 2.5*   Cardiac Enzymes: No results for input(s): CKTOTAL, CKMB, CKMBINDEX, TROPONINI in the last 168 hours. BNP (last 3 results) No results for input(s): PROBNP in the last 8760 hours. HbA1C: No results for input(s): HGBA1C in the last 72 hours. CBG: No results for input(s): GLUCAP in the last 168 hours. Lipid Profile: No results for input(s): CHOL, HDL, LDLCALC, TRIG, CHOLHDL, LDLDIRECT in the last 72 hours. Thyroid Function Tests: No results for input(s): TSH,  T4TOTAL, FREET4, T3FREE, THYROIDAB in the last 72 hours. Anemia Panel: No results for input(s): VITAMINB12, FOLATE, FERRITIN, TIBC, IRON, RETICCTPCT in the last 72 hours.  ---------------------------------------------------------------------------------------------------------------  Urine analysis:    Component Value Date/Time   COLORURINE STRAW (A) 09/08/2020 1322   APPEARANCEUR CLEAR 09/08/2020 1322   LABSPEC 1.004 (L) 09/08/2020 1322   PHURINE 7.0 09/08/2020 1322   GLUCOSEU NEGATIVE 09/08/2020 1322   GLUCOSEU NEGATIVE 04/10/2008 1246   HGBUR NEGATIVE 09/08/2020 1322   BILIRUBINUR NEGATIVE 09/08/2020 1322   KETONESUR NEGATIVE 09/08/2020 1322   PROTEINUR NEGATIVE 09/08/2020 1322   UROBILINOGEN 0.2 10/20/2019 1404   NITRITE NEGATIVE 09/08/2020 1322   LEUKOCYTESUR NEGATIVE 09/08/2020 1322      Imaging Results:    CT Angio Chest/Abd/Pel for Dissection W and/or Wo Contrast  Result Date: 09/08/2020 CLINICAL DATA:  Chest pain and back pain. Concern for aortic dissection. EXAM: CT ANGIOGRAPHY CHEST, ABDOMEN AND PELVIS TECHNIQUE: Non-contrast CT of the chest was initially obtained. Multidetector CT imaging through the chest, abdomen and pelvis was performed using the standard protocol during bolus administration of intravenous contrast. Multiplanar reconstructed images and MIPs were obtained and reviewed to evaluate the vascular anatomy. CONTRAST:  62mL OMNIPAQUE IOHEXOL 350 MG/ML SOLN COMPARISON:  CT chest dated October 25, 2019. FINDINGS: CTA CHEST FINDINGS Cardiovascular: There are advanced vascular calcifications throughout the thoracic aorta. There is no evidence for a thoracic aortic dissection. The heart is enlarged. There are coronary artery calcifications. There is no definite large centrally located pulmonary embolism, however detection of pulmonary emboli is severely limited by contrast timing. The arch vessels are patent where visualized. Mediastinum/Nodes: -- No mediastinal  lymphadenopathy. -- No hilar lymphadenopathy. -- No axillary lymphadenopathy. -- No supraclavicular lymphadenopathy. -- Normal thyroid gland where visualized. -  Unremarkable esophagus. Lungs/Pleura: Mild to moderate emphysematous changes are noted bilaterally there is atelectasis at the lung bases. There is a new linear opacity in the right middle lobe favored to represent an area atelectasis. There is no pneumothorax or large pleural effusion. The trachea is secondary to respiratory motion artifact. Musculoskeletal: No chest wall abnormality. No bony spinal canal stenosis. Review of the MIP images confirms the above findings. CTA ABDOMEN AND PELVIS FINDINGS VASCULAR Aorta: There are atherosclerotic changes throughout the abdominal aorta. There is an infrarenal abdominal aortic aneurysm measuring approximately 2.9 x 3.1 cm. Celiac: There is severe narrowing at the origin of the celiac axis. The remaining portions are widely patent. SMA: There is moderate narrowing at the origin of SMA. Renals: Both renal arteries are patent without evidence of aneurysm, dissection, vasculitis, fibromuscular dysplasia or significant stenosis. IMA: Patent without evidence of aneurysm, dissection, vasculitis or significant stenosis. Inflow: Patent without evidence of aneurysm, dissection, vasculitis or significant stenosis. Veins: No obvious venous abnormality within the limitations of this arterial phase study. Review of the MIP images confirms the above findings. NON-VASCULAR Hepatobiliary: The liver is normal. The gallbladder is distended with mild gallbladder wall thickening and pericholecystic free fluid.There is moderate to severe intrahepatic and extrahepatic biliary ductal dilatation. There appears to be an abnormal filling defect within the distal common bile duct. Pancreas: There is pancreatic ductal dilatation. There is no definite pancreatic mass. Spleen: Unremarkable. Adrenals/Urinary Tract: --Adrenal glands:  Unremarkable. --Right kidney/ureter: No hydronephrosis or radiopaque kidney stones. --Left kidney/ureter: No hydronephrosis or radiopaque kidney stones. --Urinary bladder: Unremarkable. Stomach/Bowel: --Stomach/Duodenum: No hiatal hernia or other gastric abnormality. Normal duodenal course and caliber. --Small bowel: Unremarkable. --Colon: Rectosigmoid diverticulosis without acute inflammation. --Appendix: Not visualized. No right lower quadrant inflammation or free fluid. Lymphatic: --No retroperitoneal lymphadenopathy. --No mesenteric lymphadenopathy. --No pelvic or inguinal lymphadenopathy. Reproductive: Unremarkable Other: No ascites or free air. There are  bilateral fat containing inguinal hernias. The hernia on the right contains a loop of small bowel without evidence for an obstruction. Musculoskeletal. There are old healed right pelvic fractures. The patient is status post prior percutaneous pinning of the right hip. The there is no definite acute displaced fracture. Detection of nondisplaced fractures is limited by osteopenia. Review of the MIP images confirms the above findings. IMPRESSION: 1. No evidence of thoracic or abdominal aortic dissection. 2. There is an infrarenal abdominal aortic aneurysm measuring approximately 2.9 x 3.1 cm. Recommend follow-up every 3 years. This recommendation follows ACR consensus guidelines: White Paper of the ACR Incidental Findings Committee II on Vascular Findings. J Am Coll Radiol 2013; 10:789-794. 3. There is moderate to severe intrahepatic and extrahepatic biliary ductal dilatation with an abnormal filling defect within the distal common bile duct. Correlation with MRCP/ERCP is recommended. The gallbladder is distended with gallbladder wall thickening pericholecystic free fluid. If there is clinical suspicion for acute cholecystitis, follow-up with ultrasound is recommended. 4. Rectosigmoid diverticulosis without acute inflammation. 5. Bilateral fat containing inguinal  hernias. The hernia on the right contains a loop of small bowel without evidence for obstruction. 6. Cardiomegaly and coronary artery calcifications. Aortic Atherosclerosis (ICD10-I70.0) and Emphysema (ICD10-J43.9). Electronically Signed   By: Katherine Mantlehristopher  Green M.D.   On: 09/08/2020 16:36   US Abdomen Limited RUQ (LIVER/GB)  Result Date: 09/08/2020 CLINICAL DATA:  Right upper quadrant abdominal pain. EXAM: ULTRASOUND ABDOMEN LIMITED RIGHT UPPER QUADRANT COMPARISON:  None. FINDINGS: Gallbladder: Gallbladder sludge and multiple shadowing echogenic gallstones are seen within the gallbladder lumen. The largest measures approximately 1.2 cm. The gallbladder wall measures 3.0 mm. A mild amount of pericholecystic fluid is noted. No sonographic Murphy sign noted by sonographer. Common bile duct: Diameter: 13.0 A 1.2 cm echogenic focus is seen within the lumen of the distal common bile duct. Liver: No focal lesion identified. There is diffusely increased echogenicity of the liver parenchyma. Portal vein is patent on color Doppler imaging with normal direction of blood flow towards the liver. Other: None. IMPRESSION: 1. Cholelithiasis and gallbladder sludge. 2. 1.2 cm gallstone within the distal aspect of a dilated common bile duct. 3. Fatty liver. Electronically Signed   By: Aram Candelahaddeus  Houston M.D.   On: 09/08/2020 17:28    My personal review of EKG: Wide QRS, Rate 58/min, QTc585,no Acute ST changes   Assessment & Plan:    Active Problems:   Choledocholithiasis   1. Choledocholithiasis 1. CT scan shows abnormal filling defect within the distal common bile duct follow-up with ultrasound. 2. Ultrasound shows right upper quadrant cholelithiasis and gallbladder sludge 3. GI and general surgery both consulted -options were given for ERCP versus percutaneous drainage versus both 4. Is in palliative care at home and has been since March 2020, plans for least invasive treatments-elected ERCP 5. Gastro to see in  the a.m. 6. N.p.o. 7. Hold warfarin last dose given 09/07/2020, INR 2.5 8. Continue Cipro 9. Continue to monitor 2. Elevated troponin 1. Downtrending from 44-43 2. Monitor on telemetry 3. Repeat EKG if patient has any chest pain 3. Prolonged QT 1. Avoid QT prolonging agents when possible 2. Rhythm on EKG was difficult to interpret given baseline artifact 3. QRS complexes widened on monitor as well 4. No pacemaker 5. Patient does have history of mechanical valve 6. Monitor electrolytes, monitor rhythm 4. COPD 1. Not in exacerbation 2. Continue home medications 5. CHF 1. Not in exacerbation 2. Continue home medications 6. Anxiety 1. Continue Xanax  DVT Prophylaxis-  Heparin to start tomorrow - SCDs  AM Labs Ordered, also please review Full Orders  Family Communication: Admission, patients condition and plan of care including tests being ordered have been discussed with the patient and daughter who indicate understanding and agree with the plan and Code Status.  Code Status:  DNR  Admission status: Inpatient :The appropriate admission status for this patient is INPATIENT. Inpatient status is judged to be reasonable and necessary in order to provide the required intensity of service to ensure the patient's safety. The patient's presenting symptoms, physical exam findings, and initial radiographic and laboratory data in the context of their chronic comorbidities is felt to place them at high risk for further clinical deterioration. Furthermore, it is not anticipated that the patient will be medically stable for discharge from the hospital within 2 midnights of admission. The following factors support the admission status of inpatient.     The patient's presenting symptoms include abdominal pain The worrisome physical exam findings include RR 23 The initial radiographic and laboratory data are worrisome because of choledocholithiasis on CT The chronic co-morbidities include  Anxiety, CHF, COPD, mechanical heart valve       * I certify that at the point of admission it is my clinical judgment that the patient will require inpatient hospital care spanning beyond 2 midnights from the point of admission due to high intensity of service, high risk for further deterioration and high frequency of surveillance required.*  Time spent in minutes : 65   Kimla Furth B Zierle-Ghosh DO

## 2020-09-08 NOTE — ED Triage Notes (Signed)
Pt here from Dr.DonDiego's office. He would like her to be seen for upper epigastric pain that radiates to her back.

## 2020-09-08 NOTE — ED Notes (Signed)
Patient transported to CT 

## 2020-09-08 NOTE — ED Provider Notes (Addendum)
[facility] EMERGENCY DEPARTMENT Provider Note   CSN: [id number] Arrival date & time: 09/08/20  1209     History Chief Complaint  Patient presents with  . Abdominal Pain    Colleen CablesRuth E Simpson is a 86 y.o. female.  HPI 86 year old female with a history of anxiety, carotid artery stenosis, CHF with an EF of 35%, COPD chronically on 3 L of oxygen nasal cannula, stroke, CKD stage III resents to the ER from Dr. Otilio Saberondiego's office with complaints of sharp epigastric pain which began at 2 AM.  History provided by the patient and daughter with whom she lives with at bedside.  She states that the pain is dull aching and throbbing and has subsided since the initial episode at 2 AM.  Denies any nausea, vomiting, fevers, chills.  States that the pain does radiate to her back.  She has a history of CHF but has not noticed any lower extremity swelling.  Denies any chest pain or shortness of breath.  No increasing O2 requirements.  She is chronically on warfarin.    Past Medical History:  Diagnosis Date  . Anemia   . Anxiety   . Arthritis   . Blood transfusion without reported diagnosis   . Carotid artery stenosis    Without infarction  . Cataract   . Chronic combined systolic (congestive) and diastolic (congestive) heart failure (HCC)   . COPD (chronic obstructive pulmonary disease) (HCC)   . Coronary artery disease   . Heart valve replaced by other means   . Hypercholesterolemia    Pure  . Hypertension    Unspecified  . LBBB (left bundle branch block)   . Macular degeneration (senile) of retina, unspecified   . On home O2   . Postsurgical aortocoronary bypass status   . Stroke (HCC)   . Transient global amnesia   . Unspecified hereditary and idiopathic peripheral neuropathy   . Unspecified vitamin D deficiency     Patient Active Problem List   Diagnosis Date Noted  . Choledocholithiasis 09/08/2020  . Hx of CABG 08/20/2020  . Ischemic cardiomyopathy 08/20/2020  . Orthostatic  dizziness 08/20/2020  . Chronically dry eyes, bilateral 07/02/2020  . Exudative age-related macular degeneration of left eye with active choroidal neovascularization (HCC) 04/30/2020  . Exudative age-related macular degeneration of right eye with inactive choroidal neovascularization (HCC) 04/30/2020  . Serous detachment of retinal pigment epithelium of right eye 04/30/2020  . Acute on chronic respiratory failure with hypoxia and hypercapnia (HCC) 02/07/2020  . Palliative care by specialist   . Acute on chronic combined systolic and diastolic CHF (congestive heart failure) (HCC) 02/06/2020  . Acute on chronic systolic HF (heart failure) (HCC) 02/05/2020  . Chronic respiratory failure with hypoxia and hypercapnia (HCC) 01/02/2020  . Disorder of arteries and arterioles, unspecified (HCC) 01/01/2020  . Cough 12/24/2019  . History of COVID-19 11/27/2019  . Vitamin D deficiency 11/19/2019  . Localized swelling of left foot 11/19/2019  . Insomnia 11/19/2019  . Pneumonia due to COVID-19 virus 10/25/2019  . Hematoma of thigh, right, initial encounter 07/23/2018  . Chronic combined systolic and diastolic CHF (congestive heart failure) (HCC) 07/23/2018  . Supratherapeutic INR 07/23/2018  . Acute blood loss anemia 07/23/2018  . CKD (chronic kidney disease) stage 3, GFR 30-59 ml/min (HCC) 07/23/2018  . COPD GOLD B symptoms/risk  07/23/2018  . Hematoma of right thigh 07/23/2018  . Pressure injury of skin 07/23/2018  . Hip fracture (HCC) 07/16/2018  . COPD exacerbation (HCC) 07/01/2018  .  Right hip pain 07/01/2018  . Chronic respiratory failure with hypoxia, on home O2 therapy (HCC) 07/01/2018  . CHF (congestive heart failure) (HCC) 06/28/2018  . Acute diastolic heart failure (HCC) 06/27/2018  . Anemia 06/27/2018  . Congestive dilated cardiomyopathy (HCC) 09/29/2015  . Encounter for therapeutic drug monitoring 12/12/2013  . Transient confusion 04/16/2013  . Congestive heart failure (HCC)  11/28/2012  . Dehydration 11/27/2012  . Dementia (HCC) 11/27/2012  . Pneumonia 11/24/2012  . Aortic valve disorder 01/13/2011  . Carotid artery stenosis 10/13/2009  . S/P AVR (aortic valve replacement) 10/13/2009  . Hyperlipidemia 03/06/2009  . Essential hypertension 03/06/2009  . CAD, ARTERY BYPASS GRAFT 03/06/2009  . LBBB (left bundle branch block) 03/06/2009  . Cerebral artery occlusion with cerebral infarction (HCC) 03/06/2009    Past Surgical History:  Procedure Laterality Date  . AORTIC VALVE REPLACEMENT    . APPENDECTOMY    . CORONARY ARTERY BYPASS GRAFT    . HIP PINNING,CANNULATED Right 07/18/2018   Procedure: RIGHT CANNULATED HIP PINNING;  Surgeon: Myrene Galas, MD;  Location: [facility] OR;  Service: Orthopedics;  Laterality: Right;  . TONSILLECTOMY       OB History   No obstetric history on file.     Family History  Problem Relation Age of Onset  . Heart attack Father   . Heart disease Father   . Stroke Father   . Stroke Sister   . Hypertension Mother   . Stroke Mother   . Neuropathy Brother   . COPD Sister     Social History   Tobacco Use  . Smoking status: Former Smoker    Packs/day: 0.50    Types: Cigarettes    Quit date: 11/30/1991    Years since quitting: 28.7  . Smokeless tobacco: Never Used  . Tobacco comment: Tobacco use-no  Substance Use Topics  . Alcohol use: No  . Drug use: No    Home Medications Prior to Admission medications   Medication Sig Start Date End Date Taking? Authorizing Provider  acetaminophen (TYLENOL) 650 MG CR tablet Take 650 mg by mouth every 8 (eight) hours as needed for pain.    [provider]  ALPRAZolam Prudy Feeler) 0.25 MG tablet Take 0.125-0.25 mg by mouth at bedtime as needed for anxiety.     [provider]  ANORO ELLIPTA 62.5-25 MCG/INH AEPB USE 1 INHALATION ONCE DAILY. 07/22/20   Nyoka Cowden, MD  Ascorbic Acid (VITAMIN C) 500 MG CAPS Take 500 mg by mouth daily.    [provider]   atorvastatin (LIPITOR) 40 MG tablet TAKE ONE TABLET BY MOUTH ONCE DAILY. 04/08/20   Lewayne Bunting, MD  baclofen (LIORESAL) 10 MG tablet Take 10 mg by mouth as needed for muscle spasms.    [provider]  carvedilol (COREG) 3.125 MG tablet Take 1 tablet (3.125 mg total) by mouth 2 (two) times daily with a meal. 09/01/20   Crenshaw, Madolyn Frieze, MD  docusate sodium (COLACE) 100 MG capsule Take 100 mg by mouth at bedtime.     [provider]  donepezil (ARICEPT) 10 MG tablet TAKE ONE TABLET BY MOUTH ONCE DAILY. 04/15/20   Wandra Feinstein, MD  ergocalciferol (VITAMIN D2) 50000 UNITS capsule Take 50,000 Units by mouth every Tuesday.     [provider]  ferrous sulfate 325 (65 FE) MG EC tablet Take 325 mg by mouth daily.     [provider]  furosemide (LASIX) 40 MG tablet TAKE ONE TABLET BY MOUTH  ONCE DAILY. 04/08/20   Lewayne Bunting, MD  HYDROcodone-acetaminophen (NORCO) 7.5-325 MG tablet Take 1 tablet by mouth every 6 (six) hours as needed for moderate pain.    [provider]  hydrocortisone (ANUSOL-HC) 2.5 % rectal cream Place 1 application rectally 3 (three) times daily as needed for hemorrhoids or anal itching.    [provider]  levalbuterol Pauline Aus) 0.63 MG/3ML nebulizer solution USE 1 VIAL VIA NEBULIZER EVERY 8 HOURS AS NEEDED. 06/23/20   Nyoka Cowden, MD  meclizine (ANTIVERT) 25 MG tablet Take 25 mg by mouth 3 (three) times daily as needed for dizziness.    [provider]  Melatonin 3 MG TABS Take 1 tablet by mouth at bedtime.    [provider]  methylPREDNISolone (MEDROL) 4 MG tablet Take 4 mg by mouth 2 (two) times daily as needed.    [provider]  multivitamin-lutein (OCUVITE-LUTEIN) CAPS capsule Take 1 capsule by mouth daily.    [provider]  ondansetron (ZOFRAN) 4 MG tablet Take 4 mg by mouth every 8 (eight) hours as needed for nausea or vomiting.    [provider]  polyethylene  glycol (MIRALAX / GLYCOLAX) 17 g packet Take 17 g by mouth daily as needed for moderate constipation.    [provider]  pregabalin (LYRICA) 75 MG capsule TAKE 1 CAPSULE BY MOUTH EVERY OTHER DAY. 04/29/20   Darreld Mclean, MD  spironolactone (ALDACTONE) 25 MG tablet TAKE (1/2) TABLET BY MOUTH DAILY. Patient taking differently: Take 12.5 mg by mouth daily.  12/24/19   Lewayne Bunting, MD  traMADol (ULTRAM) 50 MG tablet Take 50 mg by mouth every 6 (six) hours as needed for moderate pain.    [provider]  traZODone (DESYREL) 50 MG tablet Take 0.5-1 tablets (25-50 mg total) by mouth at bedtime as needed for sleep. 11/19/19   Wandra Feinstein, MD  warfarin (COUMADIN) 5 MG tablet TAKE 1/2 TO 1 TABLET ONCE DAILY AS DIRECTED BY COUMADIN CLINIC. 03/25/20   Lewayne Bunting, MD  zinc sulfate 50 MG CAPS capsule Take 50 mg by mouth 2 (two) times daily.     [provider]    Allergies    Codeine and Penicillins  Review of Systems   Review of Systems  Constitutional: Negative for chills and fever.  HENT: Negative for ear pain and sore throat.   Eyes: Negative for pain and visual disturbance.  Respiratory: Negative for cough and shortness of breath.   Cardiovascular: Negative for chest pain and palpitations.  Gastrointestinal: Positive for abdominal pain. Negative for vomiting.  Genitourinary: Negative for dysuria and hematuria.  Musculoskeletal: Negative for arthralgias and back pain.  Skin: Negative for color change and rash.  Neurological: Negative for seizures and syncope.  All other systems reviewed and are negative.   Physical Exam Updated Vital Signs BP (!) 187/65   Pulse 72   Temp (!) 97.5 F (36.4 C) (Oral)   Resp (!) 23   Ht  (1.575 m)   Wt 50.3 kg   SpO2 100%   BMI 20.30 kg/m   Physical Exam Vitals and nursing note reviewed.  Constitutional:      General: She is not in acute distress.    Appearance: She is well-developed. She is not  ill-appearing, toxic-appearing or diaphoretic.  HENT:     Head: Normocephalic and atraumatic.  Eyes:     Conjunctiva/sclera: Conjunctivae normal.  Cardiovascular:     Rate and Rhythm: Normal rate  and regular rhythm.     Heart sounds: Normal heart sounds. No murmur heard.   Pulmonary:     Effort: Pulmonary effort is normal. No respiratory distress.     Breath sounds: Normal breath sounds.  Abdominal:     General: Abdomen is flat.     Palpations: Abdomen is soft.     Tenderness: There is abdominal tenderness in the right upper quadrant and epigastric area. There is no right CVA tenderness or left CVA tenderness. Positive signs include Murphy's sign. Negative signs include obturator sign.     Hernia: No hernia is present.  Musculoskeletal:     Cervical back: Neck supple.  Skin:    General: Skin is warm and dry.     Capillary Refill: Capillary refill takes less than 2 seconds.     Findings: No rash.  Neurological:     General: No focal deficit present.     Mental Status: She is alert.     Cranial Nerves: No cranial nerve deficit.     Motor: No weakness.     ED Results / Procedures / Treatments   Labs (all labs ordered are listed, but only abnormal results are displayed) Labs Reviewed  CBC WITH DIFFERENTIAL/PLATELET - Abnormal; Notable for the following components:      Result Value   RBC 3.36 (*)    Hemoglobin 10.5 (*)    HCT 32.8 (*)    All other components within normal limits  COMPREHENSIVE METABOLIC PANEL - Abnormal; Notable for the following components:   Glucose, Bld 112 (*)    BUN 24 (*)    Creatinine, Ser 1.21 (*)    AST 82 (*)    GFR, Estimated 42 (*)    All other components within normal limits  URINALYSIS, ROUTINE W REFLEX MICROSCOPIC - Abnormal; Notable for the following components:   Color, Urine STRAW (*)    Specific Gravity, Urine 1.004 (*)    All other components within normal limits  PROTIME-INR - Abnormal; Notable for the following components:    Prothrombin Time 26.4 (*)    INR 2.5 (*)    All other components within normal limits  TROPONIN I (HIGH SENSITIVITY) - Abnormal; Notable for the following components:   Troponin I (High Sensitivity) 44 (*)    All other components within normal limits  TROPONIN I (HIGH SENSITIVITY) - Abnormal; Notable for the following components:   Troponin I (High Sensitivity) 43 (*)    All other components within normal limits  LIPASE, BLOOD    EKG EKG Interpretation  Date/Time:  Monday September 08 2020 14:05:15 EDT Ventricular Rate:  58 PR Interval:    QRS Duration: 185 QT Interval:  595 QTC Calculation: 585 R Axis:   87 Text Interpretation: Unknown rhythm, irregular rate IVCD, consider atypical LBBB since last tracing no significant change Confirmed by Eber Hong ([id number]) on 09/08/2020 2:35:13 PM   Radiology CT Angio Chest/Abd/Pel for Dissection W and/or Wo Contrast  Result Date: 09/08/2020 CLINICAL DATA:  Chest pain and back pain. Concern for aortic dissection. EXAM: CT ANGIOGRAPHY CHEST, ABDOMEN AND PELVIS TECHNIQUE: Non-contrast CT of the chest was initially obtained. Multidetector CT imaging through the chest, abdomen and pelvis was performed using the standard protocol during bolus administration of intravenous contrast. Multiplanar reconstructed images and MIPs were obtained and reviewed to evaluate the vascular anatomy. CONTRAST:  80mL OMNIPAQUE IOHEXOL 350 MG/ML SOLN COMPARISON:  CT chest dated October 25, 2019. FINDINGS: CTA CHEST FINDINGS Cardiovascular: There are advanced vascular calcifications  throughout the thoracic aorta. There is no evidence for a thoracic aortic dissection. The heart is enlarged. There are coronary artery calcifications. There is no definite large centrally located pulmonary embolism, however detection of pulmonary emboli is severely limited by contrast timing. The arch vessels are patent where visualized. Mediastinum/Nodes: -- No mediastinal lymphadenopathy. -- No  hilar lymphadenopathy. -- No axillary lymphadenopathy. -- No supraclavicular lymphadenopathy. -- Normal thyroid gland where visualized. -  Unremarkable esophagus. Lungs/Pleura: Mild to moderate emphysematous changes are noted bilaterally there is atelectasis at the lung bases. There is a new linear opacity in the right middle lobe favored to represent an area atelectasis. There is no pneumothorax or large pleural effusion. The trachea is secondary to respiratory motion artifact. Musculoskeletal: No chest wall abnormality. No bony spinal canal stenosis. Review of the MIP images confirms the above findings. CTA ABDOMEN AND PELVIS FINDINGS VASCULAR Aorta: There are atherosclerotic changes throughout the abdominal aorta. There is an infrarenal abdominal aortic aneurysm measuring approximately 2.9 x 3.1 cm. Celiac: There is severe narrowing at the origin of the celiac axis. The remaining portions are widely patent. SMA: There is moderate narrowing at the origin of SMA. Renals: Both renal arteries are patent without evidence of aneurysm, dissection, vasculitis, fibromuscular dysplasia or significant stenosis. IMA: Patent without evidence of aneurysm, dissection, vasculitis or significant stenosis. Inflow: Patent without evidence of aneurysm, dissection, vasculitis or significant stenosis. Veins: No obvious venous abnormality within the limitations of this arterial phase study. Review of the MIP images confirms the above findings. NON-VASCULAR Hepatobiliary: The liver is normal. The gallbladder is distended with mild gallbladder wall thickening and pericholecystic free fluid.There is moderate to severe intrahepatic and extrahepatic biliary ductal dilatation. There appears to be an abnormal filling defect within the distal common bile duct. Pancreas: There is pancreatic ductal dilatation. There is no definite pancreatic mass. Spleen: Unremarkable. Adrenals/Urinary Tract: --Adrenal glands: Unremarkable. --Right  kidney/ureter: No hydronephrosis or radiopaque kidney stones. --Left kidney/ureter: No hydronephrosis or radiopaque kidney stones. --Urinary bladder: Unremarkable. Stomach/Bowel: --Stomach/Duodenum: No hiatal hernia or other gastric abnormality. Normal duodenal course and caliber. --Small bowel: Unremarkable. --Colon: Rectosigmoid diverticulosis without acute inflammation. --Appendix: Not visualized. No right lower quadrant inflammation or free fluid. Lymphatic: --No retroperitoneal lymphadenopathy. --No mesenteric lymphadenopathy. --No pelvic or inguinal lymphadenopathy. Reproductive: Unremarkable Other: No ascites or free air. There are bilateral fat containing inguinal hernias. The hernia on the right contains a loop of small bowel without evidence for an obstruction. Musculoskeletal. There are old healed right pelvic fractures. The patient is status post prior percutaneous pinning of the right hip. The there is no definite acute displaced fracture. Detection of nondisplaced fractures is limited by osteopenia. Review of the MIP images confirms the above findings. IMPRESSION: 1. No evidence of thoracic or abdominal aortic dissection. 2. There is an infrarenal abdominal aortic aneurysm measuring approximately 2.9 x 3.1 cm. Recommend follow-up every 3 years. This recommendation follows ACR consensus guidelines: White Paper of the ACR Incidental Findings Committee II on Vascular Findings. J Am Coll Radiol 2013; 10:789-794. 3. There is moderate to severe intrahepatic and extrahepatic biliary ductal dilatation with an abnormal filling defect within the distal common bile duct. Correlation with MRCP/ERCP is recommended. The gallbladder is distended with gallbladder wall thickening pericholecystic free fluid. If there is clinical suspicion for acute cholecystitis, follow-up with ultrasound is recommended. 4. Rectosigmoid diverticulosis without acute inflammation. 5. Bilateral fat containing inguinal hernias. The hernia  on the right contains a loop of small bowel without evidence for obstruction. 6.  Cardiomegaly and coronary artery calcifications. Aortic Atherosclerosis (ICD10-I70.0) and Emphysema (ICD10-J43.9). Electronically Signed   By: Katherine Mantle M.D.   On: 09/08/2020 16:36   US Abdomen Limited RUQ (LIVER/GB)  Result Date: 09/08/2020 CLINICAL DATA:  Right upper quadrant abdominal pain. EXAM: ULTRASOUND ABDOMEN LIMITED RIGHT UPPER QUADRANT COMPARISON:  None. FINDINGS: Gallbladder: Gallbladder sludge and multiple shadowing echogenic gallstones are seen within the gallbladder lumen. The largest measures approximately 1.2 cm. The gallbladder wall measures 3.0 mm. A mild amount of pericholecystic fluid is noted. No sonographic Murphy sign noted by sonographer. Common bile duct: Diameter: 13.0 A 1.2 cm echogenic focus is seen within the lumen of the distal common bile duct. Liver: No focal lesion identified. There is diffusely increased echogenicity of the liver parenchyma. Portal vein is patent on color Doppler imaging with normal direction of blood flow towards the liver. Other: None. IMPRESSION: 1. Cholelithiasis and gallbladder sludge. 2. 1.2 cm gallstone within the distal aspect of a dilated common bile duct. 3. Fatty liver. Electronically Signed   By: Aram Candela M.D.   On: 09/08/2020 17:28    Procedures Procedures (including critical care time)  Medications Ordered in ED Medications  ciprofloxacin (CIPRO) IVPB 400 mg (has no administration in time range)  alum & mag hydroxide-simeth (MAALOX/MYLANTA) 200-200-20 MG/5ML suspension 30 mL (30 mLs Oral Given 09/08/20 1405)    And  lidocaine (XYLOCAINE) 2 % viscous mouth solution 15 mL (15 mLs Oral Given 09/08/20 1405)  iohexol (OMNIPAQUE) 350 MG/ML injection 100 mL (80 mLs Intravenous Contrast Given 09/08/20 1609)    ED Course  I have reviewed the triage vital signs and the nursing notes.  Pertinent labs & imaging results that were available  during my care of the patient were reviewed by me and considered in my medical decision making (see chart for details).    MDM Rules/Calculators/A&P                         86 year old female with epigastric pain which started around 2 AM this morning, sent from PCPs office for further evaluation On presentation, she is alert, oriented, nontoxic-appearing, no acute distress, resting comfortably in the ER bed.  Vitals are overall reassuring, no evidence of hypotension, fever or hypoxia.  She is on her normal 3 L nasal cannula here in the ER.  Physical exam with epigastric and right upper quadrant tenderness, she does have a positive Murphy sign.  However with the pain radiating to her back, that there is also concern for dissection.  Patient does have a known aortic aneurysm per the patient's daughter.  Other things on the differential include gastric reflux, ACS, PE.  Labs reviewed and interpreted by me CBC without leukocytosis, hemoglobin of 10.5 which appears to be stable.  CMP with a baseline creatinine of 1.21, no electrolyte abnormalities. Normal total bili.  She does have a elevated AST of 82.  Normal lipase.  Initial troponin of 43 which appears to be at baseline.  UA without evidence of UTI.  Imaging reviewed and interpreted by me Given radiating pain to the back, CTA of the chest abdomen pelvis was ordered.  There is no evidence of dissection, however there was noted of some wall thickening of the gallbladder and pericholecystic free fluid concerning for acute cholecystitis.  They recommended ultrasound.  Right upper quadrant ultrasound ordered, pending exam.  Likely will need to consult general surgery.   Consulted Dr. Henreitta Leber with general surgery.  Concern for choledocholithiasis,  recommends GI consult for ERCP. No evidence of ascending cholangitis- patient is afebrile, no evidence of jaundice.  Spoke with Dr. Marletta Lor with GI, he recommends admission for ERCP tomorrow.  Treated with  prophylactic Cipro given patient's allergies to penicillin per discussion with Dr. Henreitta Leber.Spoke w/ hospitalist Carren Rang who will admit the patient    Final Clinical Impression(s) / ED Diagnoses Final diagnoses:  RUQ abdominal pain  Choledocholithiasis    Rx / DC Orders ED Discharge Orders    None            Leone Brand 09/08/20 1819    Eber Hong, MD 09/09/20 [phone]

## 2020-09-09 ENCOUNTER — Inpatient Hospital Stay (HOSPITAL_COMMUNITY): Payer: Medicare Other

## 2020-09-09 ENCOUNTER — Telehealth: Payer: Self-pay

## 2020-09-09 DIAGNOSIS — Z01810 Encounter for preprocedural cardiovascular examination: Secondary | ICD-10-CM

## 2020-09-09 DIAGNOSIS — K8050 Calculus of bile duct without cholangitis or cholecystitis without obstruction: Secondary | ICD-10-CM | POA: Diagnosis not present

## 2020-09-09 LAB — CBC WITH DIFFERENTIAL/PLATELET
Abs Immature Granulocytes: 0 10*3/uL (ref 0.00–0.07)
Basophils Absolute: 0 10*3/uL (ref 0.0–0.1)
Basophils Relative: 1 %
Eosinophils Absolute: 0.6 10*3/uL — ABNORMAL HIGH (ref 0.0–0.5)
Eosinophils Relative: 9 %
HCT: 29.8 % — ABNORMAL LOW (ref 36.0–46.0)
Hemoglobin: 9.3 g/dL — ABNORMAL LOW (ref 12.0–15.0)
Immature Granulocytes: 0 %
Lymphocytes Relative: 19 %
Lymphs Abs: 1.2 10*3/uL (ref 0.7–4.0)
MCH: 30.7 pg (ref 26.0–34.0)
MCHC: 31.2 g/dL (ref 30.0–36.0)
MCV: 98.3 fL (ref 80.0–100.0)
Monocytes Absolute: 0.7 10*3/uL (ref 0.1–1.0)
Monocytes Relative: 11 %
Neutro Abs: 3.8 10*3/uL (ref 1.7–7.7)
Neutrophils Relative %: 60 %
Platelets: 158 10*3/uL (ref 150–400)
RBC: 3.03 MIL/uL — ABNORMAL LOW (ref 3.87–5.11)
RDW: 13.1 % (ref 11.5–15.5)
WBC: 6.2 10*3/uL (ref 4.0–10.5)
nRBC: 0 % (ref 0.0–0.2)

## 2020-09-09 LAB — ECHOCARDIOGRAM COMPLETE
Area-P 1/2: 3.21 cm2
Calc EF: 59.8 %
Height: 62 in
MV M vel: 6.05 m/s
MV Peak grad: 146.4 mmHg
Radius: 0.7 cm
S' Lateral: 3.09 cm
Single Plane A2C EF: 62 %
Single Plane A4C EF: 56.5 %
Weight: 1776 oz

## 2020-09-09 LAB — COMPREHENSIVE METABOLIC PANEL
ALT: 23 U/L (ref 0–44)
AST: 36 U/L (ref 15–41)
Albumin: 3.5 g/dL (ref 3.5–5.0)
Alkaline Phosphatase: 62 U/L (ref 38–126)
Anion gap: 11 (ref 5–15)
BUN: 21 mg/dL (ref 8–23)
CO2: 31 mmol/L (ref 22–32)
Calcium: 8.9 mg/dL (ref 8.9–10.3)
Chloride: 100 mmol/L (ref 98–111)
Creatinine, Ser: 1.21 mg/dL — ABNORMAL HIGH (ref 0.44–1.00)
GFR, Estimated: 42 mL/min — ABNORMAL LOW (ref >60)
Glucose, Bld: 84 mg/dL (ref 70–99)
Potassium: 3.3 mmol/L — ABNORMAL LOW (ref 3.5–5.1)
Sodium: 142 mmol/L (ref 135–145)
Total Bilirubin: 0.8 mg/dL (ref 0.3–1.2)
Total Protein: 6.1 g/dL — ABNORMAL LOW (ref 6.5–8.1)

## 2020-09-09 LAB — MAGNESIUM: Magnesium: 2.1 mg/dL (ref 1.7–2.4)

## 2020-09-09 LAB — PROTIME-INR
INR: 2.6 — ABNORMAL HIGH (ref 0.8–1.2)
Prothrombin Time: 26.6 seconds — ABNORMAL HIGH (ref 11.4–15.2)

## 2020-09-09 MED ORDER — POTASSIUM CHLORIDE 10 MEQ/100ML IV SOLN
10.0000 meq | INTRAVENOUS | Status: AC
  Administered 2020-09-09 (×4): 10 meq via INTRAVENOUS
  Filled 2020-09-09 (×4): qty 100

## 2020-09-09 MED ORDER — CIPROFLOXACIN IN D5W 400 MG/200ML IV SOLN
400.0000 mg | INTRAVENOUS | Status: DC
  Administered 2020-09-10 – 2020-09-11 (×2): 400 mg via INTRAVENOUS
  Filled 2020-09-09 (×3): qty 200

## 2020-09-09 NOTE — Progress Notes (Signed)
*  PRELIMINARY RESULTS* Echocardiogram 2D Echocardiogram has been performed.  Stacey Drain 09/09/2020, 1:21 PM

## 2020-09-09 NOTE — Progress Notes (Signed)
   Spoke to Dr. Karilyn Cota - re: this lady with symptomatic choledocholithiasis.  Needs ERCP and should be done where 24 hr cardiology service is available.  We will see the patient upoin transfer to [facility] and facilitate ERCP when appropriate.  Iva Boop, MD, [facility] 09/09/2020 3:56 PM  [phone]

## 2020-09-09 NOTE — Progress Notes (Addendum)
PROGRESS NOTE    ERIE SICA  GNF:[id number] DOB: 02-09-1926 DOA: 09/08/2020 PCP: Oval Linsey, MD   Brief Narrative:  Per HPI: [name]  is a 86 y.o. female, with history of vitamin D deficiency, transient global amnesia, stroke, COPD on 4 L nasal cannula at home, left bundle branch block, hypertension, hyperlipidemia, coronary artery disease, chronic combined systolic and diastolic heart failure, carotid artery stenosis, mechanical heart valve, and more presents to the ED with a chief complaint of right upper quadrant abdominal pain.  Patient reports that 2 AM 09/08/2020 she had sudden onset of an aching pain.  Pain was intermittent.  She had associated nausea but no vomiting.  She reports that the pain radiated through to her back.  She reports that she has been having diarrhea for 2 weeks.  She does have history of constipation and takes stool softeners.  Last normal meal was dinner on 09/07/2020.  She had fried shrimp, fried fish, coleslaw, harsh puppies, fries.  Patient rates her pain at 2 AM with severe, it decreases to mild, and intermittently increases back to severe.  Patient has not had pain like this before.   Patient and daughter both want everybody to know that she is in palliative care.  She is in palliative care secondary to COPD and CHF.  She has been in palliative care since March 2020 per daughter report.  Patient is a DNR and has paperwork with her. Patient is fully vaccinated for Covid.  She is a remote former smoker.  She does not do any illicit drugs.  She does not drink alcohol.  10/26: Patient has been admitted with acute choledocholithiasis and has been started on ciprofloxacin empirically.  GI has evaluated patient at [facility] and has discussed the case with GI at [facility] who feels that transfer is appropriate and will consider ERCP by 10/28.  2D echocardiogram was performed with LVEF 55-60% and grade 1 diastolic dysfunction noted.  GI to follow at [facility].  Assessment & Plan:   Active Problems:   Choledocholithiasis   Acute choledocholithiasis -Currently without abdominal pain or nausea -Continue on clear liquids for now -Continue ciprofloxacin -Monitor his for signs and symptoms of cholangitis -Continue to hold warfarin -No significant LFT elevation noted -Appreciate GI recommendations with need for transfer to [facility] for ERCP noted due to continued cardiology coverage available.  Family is agreeable.  Elevated troponin -Currently with flat trend and without chest pain -EKG with wide QRS complexes and suggestion of IVCD, but regular rhythm -No chest pain -2D echocardiogram with LVEF 55-60% with grade 1 diastolic dysfunction noted  Prolonged QT -Continue to monitor electrolytes with magnesium -Avoid QT prolonging agents -Monitor on telemetry  COPD with chronic hypoxemia -On 4 L nasal cannula at home -No acute bronchospasms -Breathing treatments as needed  Dyslipidemia -Plan to hold statin for now  Systolic CHF -Prior 2D echocardiogram 01/2019 with LVEF 25-30% and diffuse hypokinesis -Repeat currently pending -No signs of volume overload -Continue Coreg and spironolactone  Iron deficiency anemia -Continue to monitor repeat CBC -Appears stable -Continue iron supplementation  Anxiety -Continue Xanax  Dementia -Continue Aricept   DVT prophylaxis: SCDs Code Status: DNR Family Communication: Discussed with daughter at bedside 10/26 Disposition Plan:  Status is: Inpatient  Remains inpatient appropriate because:IV treatments appropriate due to intensity of illness or inability to take PO and Inpatient level of care appropriate due to severity of illness   Dispo: The patient is from: Home  Anticipated d/c is to: Home              Anticipated d/c date is: 2 days              Patient currently is not medically stable to d/c.  Patient requires ERCP for choledocholithiasis per GI evaluation.   Plan to transfer to [facility] for further evaluation.   Consultants:   GI  Procedures:   See below  2D echocardiogram 10/26 LVEF 55-60% with grade 1 diastolic dysfunction  Antimicrobials:  Anti-infectives (From admission, onward)   Start     Dose/Rate Route Frequency Ordered Stop   09/09/20 0800  ciprofloxacin (CIPRO) IVPB 400 mg        400 mg 200 mL/hr over 60 Minutes Intravenous Every 12 hours 09/08/20 1908     09/08/20 1800  ciprofloxacin (CIPRO) IVPB 400 mg        400 mg 200 mL/hr over 60 Minutes Intravenous  Once 09/08/20 1745 09/08/20 2000       Subjective: Patient seen and evaluated today with no new acute complaints or concerns. No acute concerns or events noted overnight.  She denies any current abdominal pain, nausea, or vomiting.  Objective: Vitals:   09/09/20 0300 09/09/20 0330 09/09/20 0530 09/09/20 0600  BP: (!) 134/41 (!) 136/40 133/68 (!) 134/47  Pulse:   (!) 55 (!) 57  Resp: 16 14 15 16   Temp:      TempSrc:      SpO2:   100% 100%  Weight:      Height:       No intake or output data in the 24 hours ending 09/09/20 0655 Filed Weights   09/08/20 1227  Weight: 50.3 kg    Examination:  General exam: Appears calm and comfortable, hard of hearing Respiratory system: Clear to auscultation. Respiratory effort normal.  Currently on 4 L nasal cannula oxygen. Cardiovascular system: S1 & S2 heard, RRR. Gastrointestinal system: Abdomen is nondistended, soft and nontender.  Central nervous system: Alert and oriented. No focal neurological deficits. Extremities: Symmetric 5 x 5 power. Skin: No rashes, lesions or ulcers Psychiatry: Judgement and insight appear normal. Mood & affect appropriate.     Data Reviewed: I have personally reviewed following labs and imaging studies  CBC: Recent Labs  Lab 09/08/20 1300 09/09/20 0545  WBC 8.5 6.2  NEUTROABS 6.4 3.8  HGB 10.5* 9.3*  HCT 32.8* 29.8*  MCV 97.6 98.3  PLT 175 158   Basic Metabolic  Panel: Recent Labs  Lab 09/08/20 1300 09/09/20 0545  NA 142 142  K 3.8 3.3*  CL 100 100  CO2 31 31  GLUCOSE 112* 84  BUN 24* 21  CREATININE 1.21* 1.21*  CALCIUM 9.4 8.9  MG  --  2.1   GFR: Estimated Creatinine Clearance: 24 mL/min (A) (by C-G formula based on SCr of 1.21 mg/dL (H)). Liver Function Tests: Recent Labs  Lab 09/08/20 1300 09/09/20 0545  AST 82* 36  ALT 32 23  ALKPHOS 72 62  BILITOT 0.9 0.8  PROT 7.1 6.1*  ALBUMIN 4.1 3.5   Recent Labs  Lab 09/08/20 1300  LIPASE 49   No results for input(s): AMMONIA in the last 168 hours. Coagulation Profile: Recent Labs  Lab 09/08/20 1300  INR 2.5*   Cardiac Enzymes: No results for input(s): CKTOTAL, CKMB, CKMBINDEX, TROPONINI in the last 168 hours. BNP (last 3 results) No results for input(s): PROBNP in the last 8760 hours. HbA1C: No results for input(s): HGBA1C  in the last 72 hours. CBG: No results for input(s): GLUCAP in the last 168 hours. Lipid Profile: No results for input(s): CHOL, HDL, LDLCALC, TRIG, CHOLHDL, LDLDIRECT in the last 72 hours. Thyroid Function Tests: No results for input(s): TSH, T4TOTAL, FREET4, T3FREE, THYROIDAB in the last 72 hours. Anemia Panel: No results for input(s): VITAMINB12, FOLATE, FERRITIN, TIBC, IRON, RETICCTPCT in the last 72 hours. Sepsis Labs: No results for input(s): PROCALCITON, LATICACIDVEN in the last 168 hours.  Recent Results (from the past 240 hour(s))  Respiratory Panel by RT PCR (Flu A&B, Covid) - Nasopharyngeal Swab     Status: None   Collection Time: 09/08/20  8:22 PM   Specimen: Nasopharyngeal Swab  Result Value Ref Range Status   SARS Coronavirus 2 by RT PCR NEGATIVE NEGATIVE Final    Comment: (NOTE) SARS-CoV-2 target nucleic acids are NOT DETECTED.  The SARS-CoV-2 RNA is generally detectable in upper respiratoy specimens during the acute phase of infection. The lowest concentration of SARS-CoV-2 viral copies this assay can detect is 131 copies/mL. A  negative result does not preclude SARS-Cov-2 infection and should not be used as the sole basis for treatment or other patient management decisions. A negative result may occur with  improper specimen collection/handling, submission of specimen other than nasopharyngeal swab, presence of viral mutation(s) within the areas targeted by this assay, and inadequate number of viral copies (<131 copies/mL). A negative result must be combined with clinical observations, patient history, and epidemiological information. The expected result is Negative.  Fact Sheet for Patients:  https://www.moore.com/  Fact Sheet for Healthcare Providers:  https://www.young.biz/  This test is no t yet approved or cleared by the [place] FDA and  has been authorized for detection and/or diagnosis of SARS-CoV-2 by FDA under an Emergency Use Authorization (EUA). This EUA will remain  in effect (meaning this test can be used) for the duration of the COVID-19 declaration under Section 564(b)(1) of the Act, 21 U.S.C. section 360bbb-3(b)(1), unless the authorization is terminated or revoked sooner.     Influenza A by PCR NEGATIVE NEGATIVE Final   Influenza B by PCR NEGATIVE NEGATIVE Final    Comment: (NOTE) The Xpert Xpress SARS-CoV-2/FLU/RSV assay is intended as an aid in  the diagnosis of influenza from Nasopharyngeal swab specimens and  should not be used as a sole basis for treatment. Nasal washings and  aspirates are unacceptable for Xpert Xpress SARS-CoV-2/FLU/RSV  testing.  Fact Sheet for Patients: https://www.moore.com/  Fact Sheet for Healthcare Providers: https://www.young.biz/  This test is not yet approved or cleared by the [place] FDA and  has been authorized for detection and/or diagnosis of SARS-CoV-2 by  FDA under an Emergency Use Authorization (EUA). This EUA will remain  in effect (meaning this test can  be used) for the duration of the  Covid-19 declaration under Section 564(b)(1) of the Act, 21  U.S.C. section 360bbb-3(b)(1), unless the authorization is  terminated or revoked. Performed at [facility], [address] [place], [place] [address]          Radiology Studies: CT Angio Chest/Abd/Pel for Dissection W and/or Wo Contrast  Result Date: 09/08/2020 CLINICAL DATA:  Chest pain and back pain. Concern for aortic dissection. EXAM: CT ANGIOGRAPHY CHEST, ABDOMEN AND PELVIS TECHNIQUE: Non-contrast CT of the chest was initially obtained. Multidetector CT imaging through the chest, abdomen and pelvis was performed using the standard protocol during bolus administration of intravenous contrast. Multiplanar reconstructed images and MIPs were obtained and reviewed to evaluate the  vascular anatomy. CONTRAST:  80mL OMNIPAQUE IOHEXOL 350 MG/ML SOLN COMPARISON:  CT chest dated October 25, 2019. FINDINGS: CTA CHEST FINDINGS Cardiovascular: There are advanced vascular calcifications throughout the thoracic aorta. There is no evidence for a thoracic aortic dissection. The heart is enlarged. There are coronary artery calcifications. There is no definite large centrally located pulmonary embolism, however detection of pulmonary emboli is severely limited by contrast timing. The arch vessels are patent where visualized. Mediastinum/Nodes: -- No mediastinal lymphadenopathy. -- No hilar lymphadenopathy. -- No axillary lymphadenopathy. -- No supraclavicular lymphadenopathy. -- Normal thyroid gland where visualized. -  Unremarkable esophagus. Lungs/Pleura: Mild to moderate emphysematous changes are noted bilaterally there is atelectasis at the lung bases. There is a new linear opacity in the right middle lobe favored to represent an area atelectasis. There is no pneumothorax or large pleural effusion. The trachea is secondary to respiratory motion artifact. Musculoskeletal: No chest wall abnormality. No bony spinal  canal stenosis. Review of the MIP images confirms the above findings. CTA ABDOMEN AND PELVIS FINDINGS VASCULAR Aorta: There are atherosclerotic changes throughout the abdominal aorta. There is an infrarenal abdominal aortic aneurysm measuring approximately 2.9 x 3.1 cm. Celiac: There is severe narrowing at the origin of the celiac axis. The remaining portions are widely patent. SMA: There is moderate narrowing at the origin of SMA. Renals: Both renal arteries are patent without evidence of aneurysm, dissection, vasculitis, fibromuscular dysplasia or significant stenosis. IMA: Patent without evidence of aneurysm, dissection, vasculitis or significant stenosis. Inflow: Patent without evidence of aneurysm, dissection, vasculitis or significant stenosis. Veins: No obvious venous abnormality within the limitations of this arterial phase study. Review of the MIP images confirms the above findings. NON-VASCULAR Hepatobiliary: The liver is normal. The gallbladder is distended with mild gallbladder wall thickening and pericholecystic free fluid.There is moderate to severe intrahepatic and extrahepatic biliary ductal dilatation. There appears to be an abnormal filling defect within the distal common bile duct. Pancreas: There is pancreatic ductal dilatation. There is no definite pancreatic mass. Spleen: Unremarkable. Adrenals/Urinary Tract: --Adrenal glands: Unremarkable. --Right kidney/ureter: No hydronephrosis or radiopaque kidney stones. --Left kidney/ureter: No hydronephrosis or radiopaque kidney stones. --Urinary bladder: Unremarkable. Stomach/Bowel: --Stomach/Duodenum: No hiatal hernia or other gastric abnormality. Normal duodenal course and caliber. --Small bowel: Unremarkable. --Colon: Rectosigmoid diverticulosis without acute inflammation. --Appendix: Not visualized. No right lower quadrant inflammation or free fluid. Lymphatic: --No retroperitoneal lymphadenopathy. --No mesenteric lymphadenopathy. --No pelvic or  inguinal lymphadenopathy. Reproductive: Unremarkable Other: No ascites or free air. There are bilateral fat containing inguinal hernias. The hernia on the right contains a loop of small bowel without evidence for an obstruction. Musculoskeletal. There are old healed right pelvic fractures. The patient is status post prior percutaneous pinning of the right hip. The there is no definite acute displaced fracture. Detection of nondisplaced fractures is limited by osteopenia. Review of the MIP images confirms the above findings. IMPRESSION: 1. No evidence of thoracic or abdominal aortic dissection. 2. There is an infrarenal abdominal aortic aneurysm measuring approximately 2.9 x 3.1 cm. Recommend follow-up every 3 years. This recommendation follows ACR consensus guidelines: White Paper of the ACR Incidental Findings Committee II on Vascular Findings. J Am Coll Radiol 2013; 10:789-794. 3. There is moderate to severe intrahepatic and extrahepatic biliary ductal dilatation with an abnormal filling defect within the distal common bile duct. Correlation with MRCP/ERCP is recommended. The gallbladder is distended with gallbladder wall thickening pericholecystic free fluid. If there is clinical suspicion for acute cholecystitis, follow-up with ultrasound is recommended. 4.  Rectosigmoid diverticulosis without acute inflammation. 5. Bilateral fat containing inguinal hernias. The hernia on the right contains a loop of small bowel without evidence for obstruction. 6. Cardiomegaly and coronary artery calcifications. Aortic Atherosclerosis (ICD10-I70.0) and Emphysema (ICD10-J43.9). Electronically Signed   By: Katherine Mantle M.D.   On: 09/08/2020 16:36   US Abdomen Limited RUQ (LIVER/GB)  Result Date: 09/08/2020 CLINICAL DATA:  Right upper quadrant abdominal pain. EXAM: ULTRASOUND ABDOMEN LIMITED RIGHT UPPER QUADRANT COMPARISON:  None. FINDINGS: Gallbladder: Gallbladder sludge and multiple shadowing echogenic gallstones are  seen within the gallbladder lumen. The largest measures approximately 1.2 cm. The gallbladder wall measures 3.0 mm. A mild amount of pericholecystic fluid is noted. No sonographic Murphy sign noted by sonographer. Common bile duct: Diameter: 13.0 A 1.2 cm echogenic focus is seen within the lumen of the distal common bile duct. Liver: No focal lesion identified. There is diffusely increased echogenicity of the liver parenchyma. Portal vein is patent on color Doppler imaging with normal direction of blood flow towards the liver. Other: None. IMPRESSION: 1. Cholelithiasis and gallbladder sludge. 2. 1.2 cm gallstone within the distal aspect of a dilated common bile duct. 3. Fatty liver. Electronically Signed   By: Aram Candela M.D.   On: 09/08/2020 17:28        Scheduled Meds: . atorvastatin  40 mg Oral Daily  . carvedilol  3.125 mg Oral BID WC  . donepezil  10 mg Oral Daily  . ferrous sulfate  325 mg Oral Daily  . heparin  5,000 Units Subcutaneous Q8H  . pregabalin  75 mg Oral Daily  . spironolactone  12.5 mg Oral Daily  . umeclidinium-vilanterol  1 puff Inhalation Daily   Continuous Infusions: . sodium chloride 50 mL/hr at 09/08/20 2302  . ciprofloxacin       LOS: 1 day    Time spent: 35 minutes    Ahron Hulbert Hoover Brunette, DO [facility]  If 7PM-7AM, please contact night-coverage [email or web address] 09/09/2020, 6:55 AM

## 2020-09-09 NOTE — TOC Initial Note (Signed)
Transition of Care [facility]) - Initial/Assessment Note    Patient Details  Name: [name] MRN: [id number] Date of Birth: 06-01-1926  Transition of Care [facility]) CM/SW Contact:    Leitha Bleak, RN Phone Number: 09/09/2020, 2:52 PM  Clinical Narrative:  Patient admitted with choledocholithiasis. Patient lives at home and has a Emergency planning/management officer. Daugher - [name] is here and providing history.  Patient is active with [facility]. [place] is the SW for [employer]. Patient needs surgery, depending on Echo results, patient may need to go to [facility] for ERCP. TOC to follow.                Expected Discharge Plan: Home w Home Health Services Barriers to Discharge: Continued Medical Work up   Patient Goals and CMS Choice Patient states their goals for this hospitalization and ongoing recovery are:: to return home. CMS Medicare.gov Compare Post Acute Care list provided to:: Patient Represenative (must comment) Choice offered to / list presented to : Adult Children  Expected Discharge Plan and Services Expected Discharge Plan: Home w Home Health Services  Living arrangements for the past 2 months: Single Family Home                   Prior Living Arrangements/Services Living arrangements for the past 2 months: Single Family Home Lives with:: Self, Other (Comment) (Care giver)      Emotional Assessment    Alcohol / Substance Use: Not Applicable Psych Involvement: No (comment)  Admission diagnosis:  Choledocholithiasis [K80.50] RUQ abdominal pain [R10.11] Patient Active Problem List   Diagnosis Date Noted  . Choledocholithiasis 09/08/2020  . Hx of CABG 08/20/2020  . Ischemic cardiomyopathy 08/20/2020  . Orthostatic dizziness 08/20/2020  . Chronically dry eyes, bilateral 07/02/2020  . Exudative age-related macular degeneration of left eye with active choroidal neovascularization (HCC) 04/30/2020  . Exudative age-related macular degeneration of right eye with inactive  choroidal neovascularization (HCC) 04/30/2020  . Serous detachment of retinal pigment epithelium of right eye 04/30/2020  . Acute on chronic respiratory failure with hypoxia and hypercapnia (HCC) 02/07/2020  . Palliative care by specialist   . Acute on chronic combined systolic and diastolic CHF (congestive heart failure) (HCC) 02/06/2020  . Acute on chronic systolic HF (heart failure) (HCC) 02/05/2020  . Chronic respiratory failure with hypoxia and hypercapnia (HCC) 01/02/2020  . Disorder of arteries and arterioles, unspecified (HCC) 01/01/2020  . Cough 12/24/2019  . History of COVID-19 11/27/2019  . Vitamin D deficiency 11/19/2019  . Localized swelling of left foot 11/19/2019  . Insomnia 11/19/2019  . Pneumonia due to COVID-19 virus 10/25/2019  . Hematoma of thigh, right, initial encounter 07/23/2018  . Chronic combined systolic and diastolic CHF (congestive heart failure) (HCC) 07/23/2018  . Supratherapeutic INR 07/23/2018  . Acute blood loss anemia 07/23/2018  . CKD (chronic kidney disease) stage 3, GFR 30-59 ml/min (HCC) 07/23/2018  . COPD GOLD B symptoms/risk  07/23/2018  . Hematoma of right thigh 07/23/2018  . Pressure injury of skin 07/23/2018  . Hip fracture (HCC) 07/16/2018  . COPD exacerbation (HCC) 07/01/2018  . Right hip pain 07/01/2018  . Chronic respiratory failure with hypoxia, on home O2 therapy (HCC) 07/01/2018  . CHF (congestive heart failure) (HCC) 06/28/2018  . Acute diastolic heart failure (HCC) 06/27/2018  . Anemia 06/27/2018  . Congestive dilated cardiomyopathy (HCC) 09/29/2015  . Encounter for therapeutic drug monitoring 12/12/2013  . Transient confusion 04/16/2013  . Congestive heart failure (HCC) 11/28/2012  . Dehydration 11/27/2012  .  Dementia (HCC) 11/27/2012  . Pneumonia 11/24/2012  . Aortic valve disorder 01/13/2011  . Carotid artery stenosis 10/13/2009  . S/P AVR (aortic valve replacement) 10/13/2009  . Hyperlipidemia 03/06/2009  . Essential  hypertension 03/06/2009  . CAD, ARTERY BYPASS GRAFT 03/06/2009  . LBBB (left bundle branch block) 03/06/2009  . Cerebral artery occlusion with cerebral infarction (HCC) 03/06/2009   PCP:  Oval Linsey, MD Pharmacy:   [facility] - [place], [place] - [phone] [address] [place] [address] Phone: [phone] Fax: [phone]   Readmission Risk Interventions Readmission Risk Prevention Plan 09/09/2020  Transportation Screening Complete  HRI or Home Care Consult Complete  Social Work Consult for Recovery Care Planning/Counseling Complete  Palliative Care Screening Complete  Medication Review Oceanographer) Complete  Some recent data might be hidden

## 2020-09-09 NOTE — Telephone Encounter (Signed)
9:45AM: Palliative care SW returned patients daughter, [name], phone call.   Daughter shared that patient was in the hospital at [facility] due to having gallbladder issues (Cholilithiasis). She stated that the staff has been very helpful and wanted to be sure that she and her sister will be able to be with patient once she is admitted. SW advised daughter that she should speak with hospital medical team  or hospital SW on their visitor protocols. Daughter stated any assistance is appreciated, due to patient being almost 86 y.o she feels both she and her sister should be at her bedside. SW will attempt to connect with palliative hospital liaison for assistance.Marland Kitchen

## 2020-09-09 NOTE — ED Notes (Signed)
Report called to Amber

## 2020-09-09 NOTE — Progress Notes (Signed)
Referring Provider: Sharyn Lull, PA (ED Provider) Primary Care Physician:  Lucia Gaskins, MD Primary Gastroenterologist:  Dr. Laural Golden (previously unassigned)  Date of Admission: 09/08/2020 Date of Consultation: 09/09/20  Reason for Consultation: Choledocholithiasis  HPI:  [name] is a 86 y.o. year old female  vitamin D deficiency, transient global amnesia, stroke, COPD on 4 L nasal cannula at home, left bundle branch block, hypertension, hyperlipidemia, coronary artery disease, chronic combined systolic and diastolic heart failure, carotid artery stenosis, mechanical heart valve on warfarin, and more, currently in palliative care since March 2020 who presented to the ED 10/25 with acute onset RUQ abdominal pain radiating to her back with associated nausea but no vomiting.  ED evaluation:  Laboratory evaluation with WBC 8.5, hemoglobin 10.5 (at baseline), BUN 24, creatinine 1.21, AST mildly elevated at 82, ALT, alk phos, and total bilirubin within normal limits, electrolytes within normal limits, troponin 44>>43, INR 2.5.  CT angio chest/abdomen/pelvis with gallbladder distention and mild wall thickening with pericholecystic free fluid, moderate to severe intrahepatic and extrahepatic biliary dilation with abnormal filling defect in the distal CBD.  Also with pancreatic ductal dilation, no definite pancreatic mass. RUQ ultrasound with cholelithiasis and gallbladder sludge with mild pericholecystic fluid, 1.2 cm gallstone within the distal aspect of the CBD.  General surgery and GI were consulted.  Due to palliative care, patient preferred least invasive treatment and elected to undergo ERCP.  General surgery recommended starting ciprofloxacin.  Today:   Woke up 2am yesterday morning with severe epigastric abdominal pain radiating to her back. Eased off around 10am. No abdominal pain since mid morning yesterday. Nausea yesterday but this has also resolved. No vomiting.  Per  patient's daughter, over the last couple of weeks, patient has complained of intermittent abdominal pain with no identified trigger. Not specifically after meals. Not specifically in the upper abdomen. States it was just mild and would self resolve so they didn't think much of it. Though it may be secondary to needing to have a BM. Patient denies any prior abdominal pain.   No routine GERD symptoms. Eats more vegetables than meets. No dysphagia. On iron daily. Stools are intermittently dark but not black. Takes a stool softener every evening. Typically with BMs daily to every other day. No bright red blood in her stool. Occasional toilet tissue hematochezia in the setting of known hemorrhoid. Present for several years.   No colonoscopy.  No prior EGD.   Daughter is very frustrated with the fact that she and her sister can not switch in and out. Patient's other daughter is sitting out in the car. States they were able to switch in and out last night. Reports she has reached out to palliative liaison to see if they can facilitate anything, but she hasn't heard back. Asking if I can "bend the rule" for them. I advised I would discuss with Dr. Manuella Ghazi.   Past Medical History:  Diagnosis Date  . Anemia   . Anxiety   . Arthritis   . Blood transfusion without reported diagnosis   . Carotid artery stenosis    Without infarction  . Cataract   . Chronic combined systolic (congestive) and diastolic (congestive) heart failure ([place])   . COPD (chronic obstructive pulmonary disease) ([place])   . Coronary artery disease   . Heart valve replaced by other means   . Hypercholesterolemia    Pure  . Hypertension    Unspecified  . LBBB (left bundle branch block)   . Macular  degeneration (senile) of retina, unspecified   . On home O2   . Postsurgical aortocoronary bypass status   . Stroke ([facility])   . Transient global amnesia   . Unspecified hereditary and idiopathic peripheral neuropathy   . Unspecified vitamin D  deficiency     Past Surgical History:  Procedure Laterality Date  . AORTIC VALVE REPLACEMENT    . APPENDECTOMY    . CORONARY ARTERY BYPASS GRAFT    . HIP PINNING,CANNULATED Right 07/18/2018   Procedure: RIGHT CANNULATED HIP PINNING;  Surgeon: Handy, Michael, MD;  Location: [facility] OR;  Service: Orthopedics;  Laterality: Right;  . TONSILLECTOMY      Prior to Admission medications   Medication Sig Start Date End Date Taking? Authorizing Provider  acetaminophen (TYLENOL) 650 MG CR tablet Take 650 mg by mouth every 8 (eight) hours as needed for pain.   Yes [provider]  ALPRAZolam (XANAX) 0.25 MG tablet Take 0.125-0.25 mg by mouth at bedtime as needed for anxiety.    Yes [provider]  ANORO ELLIPTA 62.5-25 MCG/INH AEPB USE 1 INHALATION ONCE DAILY. 07/22/20  Yes Wert, Michael B, MD  atorvastatin (LIPITOR) 40 MG tablet TAKE ONE TABLET BY MOUTH ONCE DAILY. 04/08/20  Yes Crenshaw, Brian S, MD  baclofen (LIORESAL) 10 MG tablet Take 10 mg by mouth as needed for muscle spasms.   Yes [provider]  carvedilol (COREG) 3.125 MG tablet Take 1 tablet (3.125 mg total) by mouth 2 (two) times daily with a meal. Patient taking differently: Take 6.25 mg by mouth 2 (two) times daily with a meal. Pt takes 6.5 mg twice a day. 09/01/20  Yes Crenshaw, Brian S, MD  docusate sodium (COLACE) 100 MG capsule Take 100 mg by mouth every evening.    Yes [provider]  donepezil (ARICEPT) 10 MG tablet TAKE ONE TABLET BY MOUTH ONCE DAILY. Patient taking differently: Take 10 mg by mouth at bedtime.  04/15/20  Yes Corum, Lisa L, MD  ergocalciferol (VITAMIN D2) 50000 UNITS capsule Take 50,000 Units by mouth every Tuesday.    Yes [provider]  ferrous sulfate 325 (65 FE) MG EC tablet Take 325 mg by mouth daily.    Yes [provider]  furosemide (LASIX) 40 MG tablet TAKE ONE TABLET BY MOUTH ONCE DAILY. 04/08/20  Yes Crenshaw, Brian S, MD  HYDROcodone-acetaminophen (NORCO)  7.5-325 MG tablet Take 1 tablet by mouth every 6 (six) hours as needed for moderate pain.   Yes [provider]  levalbuterol (XOPENEX) 0.63 MG/3ML nebulizer solution USE 1 VIAL VIA NEBULIZER EVERY 8 HOURS AS NEEDED. 06/23/20  Yes Wert, Michael B, MD  meclizine (ANTIVERT) 25 MG tablet Take 25 mg by mouth 3 (three) times daily as needed for dizziness.   Yes [provider]  Melatonin 3 MG TABS Take 1 tablet by mouth at bedtime.   Yes [provider]  methylPREDNISolone (MEDROL) 4 MG tablet Take 4 mg by mouth 2 (two) times daily as needed.   Yes [provider]  multivitamin-lutein (OCUVITE-LUTEIN) CAPS capsule Take 1 capsule by mouth daily.   Yes [provider]  ondansetron (ZOFRAN) 4 MG tablet Take 4 mg by mouth every 8 (eight) hours as needed for nausea or vomiting.   Yes [provider]  polyethylene glycol (MIRALAX / GLYCOLAX) 17 g packet Take 17 g by mouth daily as needed for moderate constipation.   Yes [provider]  pregabalin (LYRICA) 75 MG capsule TAKE 1 CAPSULE BY   MOUTH EVERY OTHER DAY. 04/29/20  Yes Keeling, Wayne, MD  spironolactone (ALDACTONE) 25 MG tablet TAKE (1/2) TABLET BY MOUTH DAILY. Patient taking differently: Take 12.5 mg by mouth daily.  12/24/19  Yes Crenshaw, Brian S, MD  warfarin (COUMADIN) 5 MG tablet TAKE 1/2 TO 1 TABLET ONCE DAILY AS DIRECTED BY COUMADIN CLINIC. Patient taking differently: Take 5 mg by mouth See admin instructions. Take 1/2 tablet on Monday, Wednesday and Friday and 1 whole tablet on Tues,Thursday, Saturday and Sunday. 03/25/20  Yes Crenshaw, Brian S, MD  zinc sulfate 50 MG CAPS capsule Take 50 mg by mouth 2 (two) times daily.    Yes [provider]  hydrocortisone (ANUSOL-HC) 2.5 % rectal cream Place 1 application rectally 3 (three) times daily as needed for hemorrhoids or anal itching.    [provider]  traZODone (DESYREL) 50 MG tablet Take 0.5-1 tablets (25-50 mg total) by  mouth at bedtime as needed for sleep. Patient not taking: Reported on 09/08/2020 11/19/19   Corum, Lisa L, MD    Current Facility-Administered Medications  Medication Dose Route Frequency Provider Last Rate Last Admin  . 0.9 %  sodium chloride infusion   Intravenous Continuous Zierle-Ghosh, Asia B, DO 50 mL/hr at 09/09/20 0852 New Bag at 09/09/20 0852  . acetaminophen (TYLENOL) tablet 650 mg  650 mg Oral Q6H PRN Zierle-Ghosh, Asia B, DO   650 mg at 09/09/20 0545   Or  . acetaminophen (TYLENOL) suppository 650 mg  650 mg Rectal Q6H PRN Zierle-Ghosh, Asia B, DO      . ALPRAZolam (XANAX) tablet 0.125-0.25 mg  0.125-0.25 mg Oral QHS PRN Zierle-Ghosh, Asia B, DO      . atorvastatin (LIPITOR) tablet 40 mg  40 mg Oral Daily Zierle-Ghosh, Asia B, DO   40 mg at 09/09/20 1035  . carvedilol (COREG) tablet 3.125 mg  3.125 mg Oral BID WC Zierle-Ghosh, Asia B, DO   3.125 mg at 09/09/20 0824  . [START ON 09/10/2020] ciprofloxacin (CIPRO) IVPB 400 mg  400 mg Intravenous Q24H Zierle-Ghosh, Asia B, DO      . donepezil (ARICEPT) tablet 10 mg  10 mg Oral Daily Zierle-Ghosh, Asia B, DO   10 mg at 09/09/20 1035  . ferrous sulfate tablet 325 mg  325 mg Oral Daily Zierle-Ghosh, Asia B, DO   325 mg at 09/09/20 1035  . ondansetron (ZOFRAN) tablet 4 mg  4 mg Oral Q6H PRN Zierle-Ghosh, Asia B, DO       Or  . ondansetron (ZOFRAN) injection 4 mg  4 mg Intravenous Q6H PRN Zierle-Ghosh, Asia B, DO      . pregabalin (LYRICA) capsule 75 mg  75 mg Oral Daily Zierle-Ghosh, Asia B, DO   75 mg at 09/09/20 1034  . spironolactone (ALDACTONE) tablet 12.5 mg  12.5 mg Oral Daily Zierle-Ghosh, Asia B, DO      . umeclidinium-vilanterol (ANORO ELLIPTA) 62.5-25 MCG/INH 1 puff  1 puff Inhalation Daily Zierle-Ghosh, Asia B, DO        Allergies as of 09/08/2020 - Review Complete 09/08/2020  Allergen Reaction Noted  . Codeine Nausea And Vomiting   . Penicillins Other (See Comments)     Family History  Problem Relation Age of Onset  .  Heart attack Father   . Heart disease Father   . Stroke Father   . Stroke Sister   . Hypertension Mother   . Stroke Mother   . Neuropathy Brother   . COPD Sister       Social History   Socioeconomic History  . Marital status: Widowed    Spouse name: Not on file  . Number of children: 2  . Years of education: 12th  . Highest education level: Not on file  Occupational History    Employer: RETIRED  Tobacco Use  . Smoking status: Former Smoker    Packs/day: 0.50    Types: Cigarettes    Quit date: 11/30/1991    Years since quitting: 28.7  . Smokeless tobacco: Never Used  . Tobacco comment: Tobacco use-no  Substance and Sexual Activity  . Alcohol use: No  . Drug use: No  . Sexual activity: Never  Other Topics Concern  . Not on file  Social History Narrative   Pt lives at home alone.   Caffeine Use: very rarely   Social Determinants of Health   Financial Resource Strain: Low Risk   . Difficulty of Paying Living Expenses: Not hard at all  Food Insecurity: No Food Insecurity  . Worried About Running Out of Food in the Last Year: Never true  . Ran Out of Food in the Last Year: Never true  Transportation Needs: No Transportation Needs  . Lack of Transportation (Medical): No  . Lack of Transportation (Non-Medical): No  Physical Activity: Insufficiently Active  . Days of Exercise per Week: 3 days  . Minutes of Exercise per Session: 10 min  Stress: No Stress Concern Present  . Feeling of Stress : Only a little  Social Connections: Moderately Integrated  . Frequency of Communication with Friends and Family: More than three times a week  . Frequency of Social Gatherings with Friends and Family: Three times a week  . Attends Religious Services: 1 to 4 times per year  . Active Member of Clubs or Organizations: Yes  . Attends Club or Organization Meetings: 1 to 4 times per year  . Marital Status: Widowed  Intimate Partner Violence: Not At Risk  . Fear of Current or Ex-Partner:  No  . Emotionally Abused: No  . Physically Abused: No  . Sexually Abused: No    Review of Systems: Gen: Denies fever, chills, cold or flulike symptoms, lightheadedness, dizziness, presyncope, syncope. CV: Denies chest pain or heart palpitations. Resp: Admits to shortness of breath if up moving around, no shortness of breath at rest.  No regular cough. GI: See HPI Heme: See HPI  Physical Exam: Vital signs in last 24 hours: Pulse Rate:  [55-79] 79 (10/26 0830) Resp:  [13-23] 17 (10/26 0830) BP: (130-187)/(34-110) 134/110 (10/26 0830) SpO2:  [100 %] 100 % (10/26 0830) FiO2 (%):  [32 %] 32 % (10/25 1909)   General:   Alert, frail appearing, pleasant and cooperative in NAD Head:  Normocephalic and atraumatic. Eyes:  Sclera clear, no icterus.  Ears: Decreased auditory acuity. Lungs:  Few crackles in the lung bases. Otherwise, clear throughout to auscultation.  No acute distress. Nasal canula in place.  Heart:  Regular rate and rhythm; no murmurs, clicks, rubs,  or gallops. Abdomen:  Soft, nontender and nondistended. No masses, hepatosplenomegaly or hernias noted. Normal bowel sounds, without guarding, and without rebound.   Rectal:  Deferred  Msk:  Symmetrical without gross deformities. Normal posture. Extremities:  Without edema. Neurologic:  Alert and  oriented x4;  grossly normal neurologically. Skin:  Intact without significant lesions or rashes. Psych: Normal mood and affect.  Intake/Output from previous day: No intake/output data recorded. Intake/Output this shift: No intake/output data recorded.  Lab Results: Recent Labs      09/08/20 1300 09/09/20 0545  WBC 8.5 6.2  HGB 10.5* 9.3*  HCT 32.8* 29.8*  PLT 175 158   BMET Recent Labs    09/08/20 1300 09/09/20 0545  NA 142 142  K 3.8 3.3*  CL 100 100  CO2 31 31  GLUCOSE 112* 84  BUN 24* 21  CREATININE 1.21* 1.21*  CALCIUM 9.4 8.9   LFT Recent Labs    09/08/20 1300 09/09/20 0545  PROT 7.1 6.1*  ALBUMIN  4.1 3.5  AST 82* 36  ALT 32 23  ALKPHOS 72 62  BILITOT 0.9 0.8   PT/INR Recent Labs    09/08/20 1300 09/09/20 0545  LABPROT 26.4* 26.6*  INR 2.5* 2.6*   Hepatitis Panel No results for input(s): HEPBSAG, HCVAB, HEPAIGM, HEPBIGM in the last 72 hours. C-Diff No results for input(s): CDIFFTOX in the last 72 hours.  Studies/Results: CT Angio Chest/Abd/Pel for Dissection W and/or Wo Contrast  Result Date: 09/08/2020 CLINICAL DATA:  Chest pain and back pain. Concern for aortic dissection. EXAM: CT ANGIOGRAPHY CHEST, ABDOMEN AND PELVIS TECHNIQUE: Non-contrast CT of the chest was initially obtained. Multidetector CT imaging through the chest, abdomen and pelvis was performed using the standard protocol during bolus administration of intravenous contrast. Multiplanar reconstructed images and MIPs were obtained and reviewed to evaluate the vascular anatomy. CONTRAST:  80mL OMNIPAQUE IOHEXOL 350 MG/ML SOLN COMPARISON:  CT chest dated October 25, 2019. FINDINGS: CTA CHEST FINDINGS Cardiovascular: There are advanced vascular calcifications throughout the thoracic aorta. There is no evidence for a thoracic aortic dissection. The heart is enlarged. There are coronary artery calcifications. There is no definite large centrally located pulmonary embolism, however detection of pulmonary emboli is severely limited by contrast timing. The arch vessels are patent where visualized. Mediastinum/Nodes: -- No mediastinal lymphadenopathy. -- No hilar lymphadenopathy. -- No axillary lymphadenopathy. -- No supraclavicular lymphadenopathy. -- Normal thyroid gland where visualized. -  Unremarkable esophagus. Lungs/Pleura: Mild to moderate emphysematous changes are noted bilaterally there is atelectasis at the lung bases. There is a new linear opacity in the right middle lobe favored to represent an area atelectasis. There is no pneumothorax or large pleural effusion. The trachea is secondary to respiratory motion artifact.  Musculoskeletal: No chest wall abnormality. No bony spinal canal stenosis. Review of the MIP images confirms the above findings. CTA ABDOMEN AND PELVIS FINDINGS VASCULAR Aorta: There are atherosclerotic changes throughout the abdominal aorta. There is an infrarenal abdominal aortic aneurysm measuring approximately 2.9 x 3.1 cm. Celiac: There is severe narrowing at the origin of the celiac axis. The remaining portions are widely patent. SMA: There is moderate narrowing at the origin of SMA. Renals: Both renal arteries are patent without evidence of aneurysm, dissection, vasculitis, fibromuscular dysplasia or significant stenosis. IMA: Patent without evidence of aneurysm, dissection, vasculitis or significant stenosis. Inflow: Patent without evidence of aneurysm, dissection, vasculitis or significant stenosis. Veins: No obvious venous abnormality within the limitations of this arterial phase study. Review of the MIP images confirms the above findings. NON-VASCULAR Hepatobiliary: The liver is normal. The gallbladder is distended with mild gallbladder wall thickening and pericholecystic free fluid.There is moderate to severe intrahepatic and extrahepatic biliary ductal dilatation. There appears to be an abnormal filling defect within the distal common bile duct. Pancreas: There is pancreatic ductal dilatation. There is no definite pancreatic mass. Spleen: Unremarkable. Adrenals/Urinary Tract: --Adrenal glands: Unremarkable. --Right kidney/ureter: No hydronephrosis or radiopaque kidney stones. --Left kidney/ureter: No hydronephrosis or radiopaque kidney stones. --Urinary bladder: Unremarkable. Stomach/Bowel: --Stomach/Duodenum: No hiatal   hernia or other gastric abnormality. Normal duodenal course and caliber. --Small bowel: Unremarkable. --Colon: Rectosigmoid diverticulosis without acute inflammation. --Appendix: Not visualized. No right lower quadrant inflammation or free fluid. Lymphatic: --No retroperitoneal  lymphadenopathy. --No mesenteric lymphadenopathy. --No pelvic or inguinal lymphadenopathy. Reproductive: Unremarkable Other: No ascites or free air. There are bilateral fat containing inguinal hernias. The hernia on the right contains a loop of small bowel without evidence for an obstruction. Musculoskeletal. There are old healed right pelvic fractures. The patient is status post prior percutaneous pinning of the right hip. The there is no definite acute displaced fracture. Detection of nondisplaced fractures is limited by osteopenia. Review of the MIP images confirms the above findings. IMPRESSION: 1. No evidence of thoracic or abdominal aortic dissection. 2. There is an infrarenal abdominal aortic aneurysm measuring approximately 2.9 x 3.1 cm. Recommend follow-up every 3 years. This recommendation follows ACR consensus guidelines: White Paper of the ACR Incidental Findings Committee II on Vascular Findings. J Am Coll Radiol 2013; 10:789-794. 3. There is moderate to severe intrahepatic and extrahepatic biliary ductal dilatation with an abnormal filling defect within the distal common bile duct. Correlation with MRCP/ERCP is recommended. The gallbladder is distended with gallbladder wall thickening pericholecystic free fluid. If there is clinical suspicion for acute cholecystitis, follow-up with ultrasound is recommended. 4. Rectosigmoid diverticulosis without acute inflammation. 5. Bilateral fat containing inguinal hernias. The hernia on the right contains a loop of small bowel without evidence for obstruction. 6. Cardiomegaly and coronary artery calcifications. Aortic Atherosclerosis (ICD10-I70.0) and Emphysema (ICD10-J43.9). Electronically Signed   By: Christopher  Green M.D.   On: 09/08/2020 16:36   US Abdomen Limited RUQ (LIVER/GB)  Result Date: 09/08/2020 CLINICAL DATA:  Right upper quadrant abdominal pain. EXAM: ULTRASOUND ABDOMEN LIMITED RIGHT UPPER QUADRANT COMPARISON:  None. FINDINGS: Gallbladder:  Gallbladder sludge and multiple shadowing echogenic gallstones are seen within the gallbladder lumen. The largest measures approximately 1.2 cm. The gallbladder wall measures 3.0 mm. A mild amount of pericholecystic fluid is noted. No sonographic Murphy sign noted by sonographer. Common bile duct: Diameter: 13.0 A 1.2 cm echogenic focus is seen within the lumen of the distal common bile duct. Liver: No focal lesion identified. There is diffusely increased echogenicity of the liver parenchyma. Portal vein is patent on color Doppler imaging with normal direction of blood flow towards the liver. Other: None. IMPRESSION: 1. Cholelithiasis and gallbladder sludge. 2. 1.2 cm gallstone within the distal aspect of a dilated common bile duct. 3. Fatty liver. Electronically Signed   By: Thaddeus  Houston M.D.   On: 09/08/2020 17:28    Impression: 89-year-old female with multiple chronic medical conditions including stroke, COPD on 4 L nasal cannula, left bundle branch block, HTN, HLD, CAD, chronic combined systolic and diastolic heart failure, carotid artery stenosis, mechanical heart valve on warfarin and more, currently in palliative care since March 2020 who presented to the emergency room 10/25 with acute onset epigastric abdominal pain radiating to her back with associated nausea without vomiting.  CT angio chest/abdomen/pelvis with intra and extrahepatic biliary dilation, abnormal filling defect in the distal CBD, findings of possible acute cholecystitis.  Follow-up RUQ ultrasound with cholelithiasis, gallbladder sludge, and 1.2 cm gallstone within the distal aspect of the CBD.  Labs remarkable for AST 82. Otherwise, LFTs, bilirubin, and WBC within normal limits.  General surgery and GI were consulted.  Elected for least invasive treatment due to palliative care.  General surgery recommended ciprofloxacin. GI consult for ERCP.  At this point, abdominal pain and   nausea have resolved and abdominal exam is benign.  No  signs or symptoms to suggest cholangitis. LFTs have normalized and WBC remains within normal limits. Patient has been NPO and needs ERCP.  Dr. Rehman spoke with anesthesia.  Due to multiple comorbidities, anesthesia prefers not to sedate patient locally. Dr. Rehman recommended repeat ECHO to see if EF has improved; If so, may be able to discuss with anesthesia again regarding ERCP. May have clear liquids for now.   Plan: Clear liquids for now.  Repeat ECHO today.  Continue Ciprofloxacin for now Needs ERCP. Pending ECHO, will determine if this can be performed locally or if patient will need Transfer to [place].  Monitor for signs/symptoms of cholangitis.  Continue to hold Warfarin. Continue supportive measures.    LOS: 1 day    09/09/2020, 12:36 PM    , PA-C [facility]  

## 2020-09-09 NOTE — Consult Note (Addendum)
I was present with the medical student for this service. I personally verified the history of present illness, performed the physical exam, and made the plan for this encounter. I have verified the medical student's documentation and made modifications where appropriately. I have personally documented in my own words a brief history, physical, and plan below.     Patient presenting with multiple medical co-morbidities CHF, COPD on palliative at home, on coumadin with INR 2.6. Choledocholithiasis on imaging and will need ERCP. Spoke with patient and daughter and risk of surgery in patient with mechanical valve on coumadin, with CHF/ COPD high. Needs ERCP but would not recommended surgery given the risk over benefits. She is at risk for recurrence given stones.   [place] and CT personally reviewed and ductal dilation and stones noted. Patient being transferred for ERCP given cardiac history and need for 24/7 coverage. At risk for cholangitis/ pancreatitis. Agree with antibiotics for prophylaxis.    Algis Greenhouse, MD [facility] [address] [place], [place] [address] [phone] (office)      [facility] Consult  Reason for Consult: Choledocholithiasis/ cholelithiasis  Referring Physician: Dr. Sherryll Burger   Chief Complaint    Abdominal Pain      HPI: [name] is a 86 y.o. female with hx of vitamin D deficiency, transient global amnesia, stroke, COPD on 4 L nasal cannula at home, left bundle branch block, hypertension, hyperlipidemia, coronary artery disease, chronic combined systolic and diastolic heart failure, carotid artery stenosis, and mechanical heart valve on chronic warfarin therapy. Patient's daughter at bedside and provided some of the history. Patient had presented with complaints of acute onset, achy intermittent RUQ-epigastric abdominal pain radiating to her back that had disrupted her sleep ( onset 2AM 09/08/20), prompting  subsequent evaluation. Endorses associated nausea. Denies emesis from prior to admission to present. Denies fevers or chills. Denies similar past episode. Last bowel movement was yesterday morning, with mixed consistency, and no blood. At present, patient reports no longer having abdominal pain or nausea. Patient is in palliative care; and would like to limit invasive procedures.  Past Medical History:  Diagnosis Date  . Anemia   . Anxiety   . Arthritis   . Blood transfusion without reported diagnosis   . Carotid artery stenosis    Without infarction  . Cataract   . Chronic combined systolic (congestive) and diastolic (congestive) heart failure (HCC)   . COPD (chronic obstructive pulmonary disease) (HCC)   . Coronary artery disease   . Heart valve replaced by other means   . Hypercholesterolemia    Pure  . Hypertension    Unspecified  . LBBB (left bundle branch block)   . Macular degeneration (senile) of retina, unspecified   . On home O2   . Postsurgical aortocoronary bypass status   . Stroke (HCC)   . Transient global amnesia   . Unspecified hereditary and idiopathic peripheral neuropathy   . Unspecified vitamin D deficiency     Past Surgical History:  Procedure Laterality Date  . AORTIC VALVE REPLACEMENT    . APPENDECTOMY    . CORONARY ARTERY BYPASS GRAFT    . HIP PINNING,CANNULATED Right 07/18/2018   Procedure: RIGHT CANNULATED HIP PINNING;  Surgeon: Myrene Galas, MD;  Location: [facility] OR;  Service: Orthopedics;  Laterality: Right;  . TONSILLECTOMY      Family History  Problem Relation Age of Onset  . Heart attack Father   . Heart disease Father   . Stroke Father   .  Stroke Sister   . Hypertension Mother   . Stroke Mother   . Neuropathy Brother   . COPD Sister     Social History   Tobacco Use  . Smoking status: Former Smoker    Packs/day: 0.50    Types: Cigarettes    Quit date: 11/30/1991    Years since quitting: 28.7  . Smokeless tobacco: Never Used  .  Tobacco comment: Tobacco use-no  Substance Use Topics  . Alcohol use: No  . Drug use: No    Medications:  Scheduled: . carvedilol  3.125 mg Oral BID WC  . donepezil  10 mg Oral Daily  . ferrous sulfate  325 mg Oral Daily  . pregabalin  75 mg Oral Daily  . spironolactone  12.5 mg Oral Daily  . umeclidinium-vilanterol  1 puff Inhalation Daily    Allergies  Allergen Reactions  . Codeine Nausea And Vomiting  . Penicillins Other (See Comments)    Unknown reaction - listed on MAR Has patient had a PCN reaction causing immediate rash, facial/tongue/throat swelling, SOB or lightheadedness with hypotension: Unknown Has patient had a PCN reaction causing severe rash involving mucus membranes or skin necrosis: Unknown Has patient had a PCN reaction that required hospitalization: Unknown Has patient had a PCN reaction occurring within the last 10 years: Unknown If all of the above answers are "NO", then may proceed with Cephalosporin use.     ROS:  Pertinent items are noted in HPI.  Blood pressure (!) 138/58, pulse 62, temperature 97.9 F (36.6 C), temperature source Oral, resp. rate 20, height  (1.575 m), weight 50.3 kg, SpO2 100 %. Physical Exam Vitals reviewed.  Constitutional:      General: She is not in acute distress.    Appearance: She is not diaphoretic.  HENT:     Head: Normocephalic.  Eyes:     Extraocular Movements: Extraocular movements intact.  Cardiovascular:     Rate and Rhythm: Normal rate.  Pulmonary:     Effort: Pulmonary effort is normal.  Abdominal:     General: Abdomen is flat. There is no distension.     Palpations: Abdomen is soft. There is no mass.     Tenderness: There is no abdominal tenderness. Negative signs include Murphy's sign.  Musculoskeletal:     Comments: Moves all extremities  Skin:    General: Skin is warm.  Neurological:     General: No focal deficit present.     Mental Status: She is alert and oriented to person, place, and  time.  Psychiatric:        Mood and Affect: Mood normal.        Behavior: Behavior normal.     Results: Results for orders placed or performed during the hospital encounter of 09/08/20 (from the past 48 hour(s))  CBC with Differential     Status: Abnormal   Collection Time: 09/08/20  1:00 PM  Result Value Ref Range   WBC 8.5 4.0 - 10.5 K/uL   RBC 3.36 (L) 3.87 - 5.11 MIL/uL   Hemoglobin 10.5 (L) 12.0 - 15.0 g/dL   HCT 16.1 (L) 36 - 46 %   MCV 97.6 80.0 - 100.0 fL   MCH 31.3 26.0 - 34.0 pg   MCHC 32.0 30.0 - 36.0 g/dL   RDW 09.6 04.5 - 40.9 %   Platelets 175 150 - 400 K/uL   nRBC 0.0 0.0 - 0.2 %   Neutrophils Relative % 76 %   Neutro Abs  6.4 1.7 - 7.7 K/uL   Lymphocytes Relative 10 %   Lymphs Abs 0.9 0.7 - 4.0 K/uL   Monocytes Relative 9 %   Monocytes Absolute 0.8 0.1 - 1.0 K/uL   Eosinophils Relative 5 %   Eosinophils Absolute 0.4 0.0 - 0.5 K/uL   Basophils Relative 0 %   Basophils Absolute 0.0 0.0 - 0.1 K/uL   Immature Granulocytes 0 %   Abs Immature Granulocytes 0.01 0.00 - 0.07 K/uL    Comment: Performed at [facility], [address] [place], [place] [address]  Comprehensive metabolic panel     Status: Abnormal   Collection Time: 09/08/20  1:00 PM  Result Value Ref Range   Sodium 142 135 - 145 mmol/L   Potassium 3.8 3.5 - 5.1 mmol/L   Chloride 100 98 - 111 mmol/L   CO2 31 22 - 32 mmol/L   Glucose, Bld 112 (H) 70 - 99 mg/dL    Comment: Glucose reference range applies only to samples taken after fasting for at least 8 hours.   BUN 24 (H) 8 - 23 mg/dL   Creatinine, Ser 2.84 (H) 0.44 - 1.00 mg/dL   Calcium 9.4 8.9 - 13.2 mg/dL   Total Protein 7.1 6.5 - 8.1 g/dL   Albumin 4.1 3.5 - 5.0 g/dL   AST 82 (H) 15 - 41 U/L   ALT 32 0 - 44 U/L   Alkaline Phosphatase 72 38 - 126 U/L   Total Bilirubin 0.9 0.3 - 1.2 mg/dL   GFR, Estimated 42 (L) >60 mL/min    Comment: (NOTE) Calculated using the CKD-EPI Creatinine Equation (2021)    Anion gap 11 5 - 15    Comment:  Performed at [facility], [address], [place], [place] [address]  Lipase, blood     Status: None   Collection Time: 09/08/20  1:00 PM  Result Value Ref Range   Lipase 49 11 - 51 U/L    Comment: Performed at [facility], [address] [place], [place] [address]  Protime-INR     Status: Abnormal   Collection Time: 09/08/20  1:00 PM  Result Value Ref Range   Prothrombin Time 26.4 (H) 11.4 - 15.2 seconds   INR 2.5 (H) 0.8 - 1.2    Comment: (NOTE) INR goal varies based on device and disease states. Performed at [facility], [address], [place], [place] [address]   Troponin I (High Sensitivity)     Status: Abnormal   Collection Time: 09/08/20  1:00 PM  Result Value Ref Range   Troponin I (High Sensitivity) 44 (H) <18 ng/L    Comment: (NOTE) Elevated high sensitivity troponin I (hsTnI) values and significant  changes across serial measurements may suggest ACS but many other  chronic and acute conditions are known to elevate hsTnI results.  Refer to the "Links" section for chest pain algorithms and additional  guidance. Performed at [facility], [address] [place], [place] [address]   Urinalysis, Routine w reflex microscopic Urine, Clean Catch     Status: Abnormal   Collection Time: 09/08/20  1:22 PM  Result Value Ref Range   Color, Urine STRAW (A) YELLOW   APPearance CLEAR CLEAR   Specific Gravity, Urine 1.004 (L) 1.005 - 1.030   pH 7.0 5.0 - 8.0   Glucose, UA NEGATIVE NEGATIVE mg/dL   Hgb urine dipstick NEGATIVE NEGATIVE   Bilirubin Urine NEGATIVE NEGATIVE   Ketones, ur NEGATIVE NEGATIVE mg/dL   Protein, ur NEGATIVE NEGATIVE  mg/dL   Nitrite NEGATIVE NEGATIVE   Leukocytes,Ua NEGATIVE NEGATIVE    Comment: Performed at [facility], [address] [place], [place] [address]  Troponin I (High Sensitivity)     Status: Abnormal   Collection Time: 09/08/20  3:24 PM  Result Value Ref Range   Troponin I (High Sensitivity) 43 (H) <18 ng/L    Comment: (NOTE) Elevated high  sensitivity troponin I (hsTnI) values and significant  changes across serial measurements may suggest ACS but many other  chronic and acute conditions are known to elevate hsTnI results.  Refer to the "Links" section for chest pain algorithms and additional  guidance. Performed at [facility], [address] [place], [place] [address]   Respiratory Panel by RT PCR (Flu A&B, Covid) - Nasopharyngeal Swab     Status: None   Collection Time: 09/08/20  8:22 PM   Specimen: Nasopharyngeal Swab  Result Value Ref Range   SARS Coronavirus 2 by RT PCR NEGATIVE NEGATIVE    Comment: (NOTE) SARS-CoV-2 target nucleic acids are NOT DETECTED.  The SARS-CoV-2 RNA is generally detectable in upper respiratoy specimens during the acute phase of infection. The lowest concentration of SARS-CoV-2 viral copies this assay can detect is 131 copies/mL. A negative result does not preclude SARS-Cov-2 infection and should not be used as the sole basis for treatment or other patient management decisions. A negative result may occur with  improper specimen collection/handling, submission of specimen other than nasopharyngeal swab, presence of viral mutation(s) within the areas targeted by this assay, and inadequate number of viral copies (<131 copies/mL). A negative result must be combined with clinical observations, patient history, and epidemiological information. The expected result is Negative.  Fact Sheet for Patients:  https://www.moore.com/https://www.fda.gov/media/142436/download  Fact Sheet for Healthcare Providers:  https://www.young.biz/https://www.fda.gov/media/142435/download  This test is no t yet approved or cleared by the [place] FDA and  has been authorized for detection and/or diagnosis of SARS-CoV-2 by FDA under an Emergency Use Authorization (EUA). This EUA will remain  in effect (meaning this test can be used) for the duration of the COVID-19 declaration under Section 564(b)(1) of the Act, 21 U.S.C. section 360bbb-3(b)(1),  unless the authorization is terminated or revoked sooner.     Influenza A by PCR NEGATIVE NEGATIVE   Influenza B by PCR NEGATIVE NEGATIVE    Comment: (NOTE) The Xpert Xpress SARS-CoV-2/FLU/RSV assay is intended as an aid in  the diagnosis of influenza from Nasopharyngeal swab specimens and  should not be used as a sole basis for treatment. Nasal washings and  aspirates are unacceptable for Xpert Xpress SARS-CoV-2/FLU/RSV  testing.  Fact Sheet for Patients: https://www.moore.com/https://www.fda.gov/media/142436/download  Fact Sheet for Healthcare Providers: https://www.young.biz/https://www.fda.gov/media/142435/download  This test is not yet approved or cleared by the [place] FDA and  has been authorized for detection and/or diagnosis of SARS-CoV-2 by  FDA under an Emergency Use Authorization (EUA). This EUA will remain  in effect (meaning this test can be used) for the duration of the  Covid-19 declaration under Section 564(b)(1) of the Act, 21  U.S.C. section 360bbb-3(b)(1), unless the authorization is  terminated or revoked. Performed at [facility], [address] [place], [place] [address]   Comprehensive metabolic panel     Status: Abnormal   Collection Time: 09/09/20  5:45 AM  Result Value Ref Range   Sodium 142 135 - 145 mmol/L   Potassium 3.3 (L) 3.5 - 5.1 mmol/L   Chloride 100 98 - 111 mmol/L   CO2 31 22 -  32 mmol/L   Glucose, Bld 84 70 - 99 mg/dL    Comment: Glucose reference range applies only to samples taken after fasting for at least 8 hours.   BUN 21 8 - 23 mg/dL   Creatinine, Ser 2.95 (H) 0.44 - 1.00 mg/dL   Calcium 8.9 8.9 - 62.1 mg/dL   Total Protein 6.1 (L) 6.5 - 8.1 g/dL   Albumin 3.5 3.5 - 5.0 g/dL   AST 36 15 - 41 U/L   ALT 23 0 - 44 U/L   Alkaline Phosphatase 62 38 - 126 U/L   Total Bilirubin 0.8 0.3 - 1.2 mg/dL   GFR, Estimated 42 (L) >60 mL/min    Comment: (NOTE) Calculated using the CKD-EPI Creatinine Equation (2021)    Anion gap 11 5 - 15    Comment: Performed at [facility], [address] [place], [place] [address]  Magnesium     Status: None   Collection Time: 09/09/20  5:45 AM  Result Value Ref Range   Magnesium 2.1 1.7 - 2.4 mg/dL    Comment: Performed at [name], [address], [place], [place] [address]  CBC WITH DIFFERENTIAL     Status: Abnormal   Collection Time: 09/09/20  5:45 AM  Result Value Ref Range   WBC 6.2 4.0 - 10.5 K/uL   RBC 3.03 (L) 3.87 - 5.11 MIL/uL   Hemoglobin 9.3 (L) 12.0 - 15.0 g/dL   HCT 62.9 (L) 36 - 46 %   MCV 98.3 80.0 - 100.0 fL   MCH 30.7 26.0 - 34.0 pg   MCHC 31.2 30.0 - 36.0 g/dL   RDW 52.8 41.3 - 24.4 %   Platelets 158 150 - 400 K/uL   nRBC 0.0 0.0 - 0.2 %   Neutrophils Relative % 60 %   Neutro Abs 3.8 1.7 - 7.7 K/uL   Lymphocytes Relative 19 %   Lymphs Abs 1.2 0.7 - 4.0 K/uL   Monocytes Relative 11 %   Monocytes Absolute 0.7 0.1 - 1.0 K/uL   Eosinophils Relative 9 %   Eosinophils Absolute 0.6 (H) 0.0 - 0.5 K/uL   Basophils Relative 1 %   Basophils Absolute 0.0 0.0 - 0.1 K/uL   Immature Granulocytes 0 %   Abs Immature Granulocytes 0.00 0.00 - 0.07 K/uL    Comment: Performed at [facility], [address], [place], [place] [address]  Protime-INR     Status: Abnormal   Collection Time: 09/09/20  5:45 AM  Result Value Ref Range   Prothrombin Time 26.6 (H) 11.4 - 15.2 seconds   INR 2.6 (H) 0.8 - 1.2    Comment: (NOTE) INR goal varies based on device and disease states. Performed at [facility], [address], [place], [place] [address]     ECHOCARDIOGRAM COMPLETE  Result Date: 09/09/2020    ECHOCARDIOGRAM REPORT   Patient Name:   [name] Date of Exam: 09/09/2020 Medical Rec #:  [id number]       Height:       62.0 in Accession #:    [id number]      Weight:       111.0 lb Date of Birth:  1926-03-09      BSA:          1.488 m Patient Age:    89 years        BP:           134/110 mmHg Patient Gender: F  HR:           63 bpm. [facility] Procedure: 2D Echo, Cardiac  Doppler and Color Doppler Indications:    Pre-operative cardiovascular examination V72.81 / Z01.810  History:        Patient has prior history of Echocardiogram examinations. CAD,                 Stroke; Risk Factors:Hypertension and Dyslipidemia. Aortic valve                 replacement.  Sonographer:    Celesta Gentile RCS Referring Phys: [id number] PRATIK D [facility] IMPRESSIONS  1. Left ventricular ejection fraction, by estimation, is 55 to 60%. The left ventricle has normal function. The left ventricle has no regional wall motion abnormalities. There is moderate left ventricular hypertrophy. Left ventricular diastolic parameters are consistent with Grade I diastolic dysfunction (impaired relaxation). Elevated left atrial pressure.  2. Right ventricular systolic function is normal. The right ventricular size is normal.  3. Left atrial size was severely dilated.  4. Right atrial size was mildly dilated.  5. The mitral valve is abnormal. Mild to moderate mitral valve regurgitation. No evidence of mitral stenosis. Moderate mitral annular calcification.  6. History of AV replacement, unknown type. . The aortic valve has been repaired/replaced. There is moderate calcification of the aortic valve. There is moderate thickening of the aortic valve. Aortic valve regurgitation is moderate. No aortic stenosis is present.  7. The inferior vena cava is normal in size with greater than 50% respiratory variability, suggesting right atrial pressure of 3 mmHg. FINDINGS  Left Ventricle: Left ventricular ejection fraction, by estimation, is 55 to 60%. The left ventricle has normal function. The left ventricle has no regional wall motion abnormalities. The left ventricular internal cavity size was normal in size. There is  moderate left ventricular hypertrophy. Left ventricular diastolic parameters are consistent with Grade I diastolic dysfunction (impaired relaxation). Elevated left atrial pressure. Right Ventricle: The right ventricular  size is normal. No increase in right ventricular wall thickness. Right ventricular systolic function is normal. Left Atrium: Left atrial size was severely dilated. Right Atrium: Right atrial size was mildly dilated. Pericardium: There is no evidence of pericardial effusion. Mitral Valve: The mitral valve is abnormal. There is mild thickening of the mitral valve leaflet(s). There is mild calcification of the mitral valve leaflet(s). Moderate mitral annular calcification. Mild to moderate mitral valve regurgitation. No evidence of mitral valve stenosis. Tricuspid Valve: The tricuspid valve is normal in structure. Tricuspid valve regurgitation is trivial. No evidence of tricuspid stenosis. Aortic Valve: History of AV replacement, unknown type. The aortic valve has been repaired/replaced. There is moderate calcification of the aortic valve. There is moderate thickening of the aortic valve. There is moderate aortic valve annular calcification. Aortic valve regurgitation is moderate. No aortic stenosis is present. Pulmonic Valve: The pulmonic valve was not well visualized. Pulmonic valve regurgitation is not visualized. No evidence of pulmonic stenosis. Aorta: The aortic root is normal in size and structure. Pulmonary Artery: Indeterminant PASP, inadequate TR jet. Venous: The inferior vena cava is normal in size with greater than 50% respiratory variability, suggesting right atrial pressure of 3 mmHg. IAS/Shunts: No atrial level shunt detected by color flow Doppler.  LEFT VENTRICLE PLAX 2D LVIDd:         5.11 cm      Diastology LVIDs:         3.09 cm      LV e'  medial:    4.79 cm/s LV PW:         1.25 cm      LV E/e' medial:  26.1 LV IVS:        1.22 cm      LV e' lateral:   4.13 cm/s LVOT diam:     1.50 cm      LV E/e' lateral: 30.3 LV SV:         45 LV SV Index:   31 LVOT Area:     1.77 cm  LV Volumes (MOD) LV vol d, MOD A2C: 143.0 ml LV vol d, MOD A4C: 161.0 ml LV vol s, MOD A2C: 54.4 ml LV vol s, MOD A4C: 70.0 ml LV  SV MOD A2C:     88.6 ml LV SV MOD A4C:     161.0 ml LV SV MOD BP:      91.6 ml RIGHT VENTRICLE RV S prime:     9.68 cm/s TAPSE (M-mode): 1.9 cm LEFT ATRIUM             Index       RIGHT ATRIUM           Index LA diam:        4.00 cm 2.69 cm/m  RA Area:     15.50 cm LA Vol (A2C):   87.8 ml 58.99 ml/m RA Volume:   45.80 ml  30.77 ml/m LA Vol (A4C):   88.4 ml 59.39 ml/m LA Biplane Vol: 89.3 ml 59.99 ml/m  AORTIC VALVE LVOT Vmax:   96.70 cm/s LVOT Vmean:  57.100 cm/s LVOT VTI:    0.257 m  AORTA Ao Root diam: 3.00 cm MITRAL VALVE MV Area (PHT): 3.21 cm      SHUNTS MV Decel Time: 236 msec      Systemic VTI:  0.26 m MR Peak grad:    146.4 mmHg  Systemic Diam: 1.50 cm MR Mean grad:    91.0 mmHg MR Vmax:         605.00 cm/s MR Vmean:        443.0 cm/s MR PISA:         3.08 cm MR PISA Eff ROA: 16 mm MR PISA Radius:  0.70 cm MV E velocity: 125.00 cm/s MV A velocity: 144.00 cm/s MV E/A ratio:  0.87 Dina Rich MD Electronically signed by Dina Rich MD Signature Date/Time: 09/09/2020/4:14:42 PM    Final    CT Angio Chest/Abd/Pel for Dissection W and/or Wo Contrast  Result Date: 09/08/2020 CLINICAL DATA:  Chest pain and back pain. Concern for aortic dissection. EXAM: CT ANGIOGRAPHY CHEST, ABDOMEN AND PELVIS TECHNIQUE: Non-contrast CT of the chest was initially obtained. Multidetector CT imaging through the chest, abdomen and pelvis was performed using the standard protocol during bolus administration of intravenous contrast. Multiplanar reconstructed images and MIPs were obtained and reviewed to evaluate the vascular anatomy. CONTRAST:  80mL OMNIPAQUE IOHEXOL 350 MG/ML SOLN COMPARISON:  CT chest dated October 25, 2019. FINDINGS: CTA CHEST FINDINGS Cardiovascular: There are advanced vascular calcifications throughout the thoracic aorta. There is no evidence for a thoracic aortic dissection. The heart is enlarged. There are coronary artery calcifications. There is no definite large centrally located  pulmonary embolism, however detection of pulmonary emboli is severely limited by contrast timing. The arch vessels are patent where visualized. Mediastinum/Nodes: -- No mediastinal lymphadenopathy. -- No hilar lymphadenopathy. -- No axillary lymphadenopathy. -- No supraclavicular lymphadenopathy. -- Normal thyroid gland where visualized. -  Unremarkable esophagus.  Lungs/Pleura: Mild to moderate emphysematous changes are noted bilaterally there is atelectasis at the lung bases. There is a new linear opacity in the right middle lobe favored to represent an area atelectasis. There is no pneumothorax or large pleural effusion. The trachea is secondary to respiratory motion artifact. Musculoskeletal: No chest wall abnormality. No bony spinal canal stenosis. Review of the MIP images confirms the above findings. CTA ABDOMEN AND PELVIS FINDINGS VASCULAR Aorta: There are atherosclerotic changes throughout the abdominal aorta. There is an infrarenal abdominal aortic aneurysm measuring approximately 2.9 x 3.1 cm. Celiac: There is severe narrowing at the origin of the celiac axis. The remaining portions are widely patent. SMA: There is moderate narrowing at the origin of SMA. Renals: Both renal arteries are patent without evidence of aneurysm, dissection, vasculitis, fibromuscular dysplasia or significant stenosis. IMA: Patent without evidence of aneurysm, dissection, vasculitis or significant stenosis. Inflow: Patent without evidence of aneurysm, dissection, vasculitis or significant stenosis. Veins: No obvious venous abnormality within the limitations of this arterial phase study. Review of the MIP images confirms the above findings. NON-VASCULAR Hepatobiliary: The liver is normal. The gallbladder is distended with mild gallbladder wall thickening and pericholecystic free fluid.There is moderate to severe intrahepatic and extrahepatic biliary ductal dilatation. There appears to be an abnormal filling defect within the distal  common bile duct. Pancreas: There is pancreatic ductal dilatation. There is no definite pancreatic mass. Spleen: Unremarkable. Adrenals/Urinary Tract: --Adrenal glands: Unremarkable. --Right kidney/ureter: No hydronephrosis or radiopaque kidney stones. --Left kidney/ureter: No hydronephrosis or radiopaque kidney stones. --Urinary bladder: Unremarkable. Stomach/Bowel: --Stomach/Duodenum: No hiatal hernia or other gastric abnormality. Normal duodenal course and caliber. --Small bowel: Unremarkable. --Colon: Rectosigmoid diverticulosis without acute inflammation. --Appendix: Not visualized. No right lower quadrant inflammation or free fluid. Lymphatic: --No retroperitoneal lymphadenopathy. --No mesenteric lymphadenopathy. --No pelvic or inguinal lymphadenopathy. Reproductive: Unremarkable Other: No ascites or free air. There are bilateral fat containing inguinal hernias. The hernia on the right contains a loop of small bowel without evidence for an obstruction. Musculoskeletal. There are old healed right pelvic fractures. The patient is status post prior percutaneous pinning of the right hip. The there is no definite acute displaced fracture. Detection of nondisplaced fractures is limited by osteopenia. Review of the MIP images confirms the above findings. IMPRESSION: 1. No evidence of thoracic or abdominal aortic dissection. 2. There is an infrarenal abdominal aortic aneurysm measuring approximately 2.9 x 3.1 cm. Recommend follow-up every 3 years. This recommendation follows ACR consensus guidelines: White Paper of the ACR Incidental Findings Committee II on Vascular Findings. J Am Coll Radiol 2013; 10:789-794. 3. There is moderate to severe intrahepatic and extrahepatic biliary ductal dilatation with an abnormal filling defect within the distal common bile duct. Correlation with MRCP/ERCP is recommended. The gallbladder is distended with gallbladder wall thickening pericholecystic free fluid. If there is clinical  suspicion for acute cholecystitis, follow-up with ultrasound is recommended. 4. Rectosigmoid diverticulosis without acute inflammation. 5. Bilateral fat containing inguinal hernias. The hernia on the right contains a loop of small bowel without evidence for obstruction. 6. Cardiomegaly and coronary artery calcifications. Aortic Atherosclerosis (ICD10-I70.0) and Emphysema (ICD10-J43.9). Electronically Signed   By: Katherine Mantle M.D.   On: 09/08/2020 16:36   US Abdomen Limited RUQ (LIVER/GB)  Result Date: 09/08/2020 CLINICAL DATA:  Right upper quadrant abdominal pain. EXAM: ULTRASOUND ABDOMEN LIMITED RIGHT UPPER QUADRANT COMPARISON:  None. FINDINGS: Gallbladder: Gallbladder sludge and multiple shadowing echogenic gallstones are seen within the gallbladder lumen. The largest measures approximately 1.2 cm. The gallbladder  wall measures 3.0 mm. A mild amount of pericholecystic fluid is noted. No sonographic Murphy sign noted by sonographer. Common bile duct: Diameter: 13.0 A 1.2 cm echogenic focus is seen within the lumen of the distal common bile duct. Liver: No focal lesion identified. There is diffusely increased echogenicity of the liver parenchyma. Portal vein is patent on color Doppler imaging with normal direction of blood flow towards the liver. Other: None. IMPRESSION: 1. Cholelithiasis and gallbladder sludge. 2. 1.2 cm gallstone within the distal aspect of a dilated common bile duct. 3. Fatty liver. Electronically Signed   By: Aram Candela M.D.   On: 09/08/2020 17:28     Assessment & Plan:  [name] is a 86 y.o. female with significant cardiopulmonary history, on chronic warfarin therapy presenting with acute new-onset RUQ-epigastric pain radiating to back; afebrile with normal WBC and negative murphey's; and abdominal ultrasound suggestive of cholelithiasis, mild pericholecystic fluid, and likely choledocholithiasis with associated common bile duct dilation. Patient reports doing  well, and no longer with abdominal pain or nausea. Unfortunately, patient is a poor operative candidate given cardiac history; and patient and daughter would like to minimize invasive procedures if possible.   -GI consult for ERCP -Not undergoing cholecystectomy at this time. Patient aware of risk for further obstructive stones. Monitor for progression; consider procedure at a tertiary center if indicated  All questions were answered to the satisfaction of the patient and family.  The risk and benefits of cholcystectomy were discussed. After careful consideration, [name] and family has decided to defer cholecystectomy at this time.    Princess Eulis Manly 09/09/2020, 4:38 PM

## 2020-09-10 DIAGNOSIS — K8050 Calculus of bile duct without cholangitis or cholecystitis without obstruction: Secondary | ICD-10-CM

## 2020-09-10 DIAGNOSIS — I447 Left bundle-branch block, unspecified: Secondary | ICD-10-CM

## 2020-09-10 DIAGNOSIS — I5042 Chronic combined systolic (congestive) and diastolic (congestive) heart failure: Secondary | ICD-10-CM

## 2020-09-10 DIAGNOSIS — I10 Essential (primary) hypertension: Secondary | ICD-10-CM | POA: Diagnosis not present

## 2020-09-10 DIAGNOSIS — Z952 Presence of prosthetic heart valve: Secondary | ICD-10-CM

## 2020-09-10 LAB — CBC
HCT: 28.4 % — ABNORMAL LOW (ref 36.0–46.0)
Hemoglobin: 8.8 g/dL — ABNORMAL LOW (ref 12.0–15.0)
MCH: 31.3 pg (ref 26.0–34.0)
MCHC: 31 g/dL (ref 30.0–36.0)
MCV: 101.1 fL — ABNORMAL HIGH (ref 80.0–100.0)
Platelets: 160 10*3/uL (ref 150–400)
RBC: 2.81 MIL/uL — ABNORMAL LOW (ref 3.87–5.11)
RDW: 13.2 % (ref 11.5–15.5)
WBC: 6.7 10*3/uL (ref 4.0–10.5)
nRBC: 0 % (ref 0.0–0.2)

## 2020-09-10 LAB — COMPREHENSIVE METABOLIC PANEL
ALT: 17 U/L (ref 0–44)
AST: 27 U/L (ref 15–41)
Albumin: 3.2 g/dL — ABNORMAL LOW (ref 3.5–5.0)
Alkaline Phosphatase: 56 U/L (ref 38–126)
Anion gap: 4 — ABNORMAL LOW (ref 5–15)
BUN: 15 mg/dL (ref 8–23)
CO2: 31 mmol/L (ref 22–32)
Calcium: 8.6 mg/dL — ABNORMAL LOW (ref 8.9–10.3)
Chloride: 106 mmol/L (ref 98–111)
Creatinine, Ser: 1.17 mg/dL — ABNORMAL HIGH (ref 0.44–1.00)
GFR, Estimated: 44 mL/min — ABNORMAL LOW (ref >60)
Glucose, Bld: 75 mg/dL (ref 70–99)
Potassium: 3.9 mmol/L (ref 3.5–5.1)
Sodium: 141 mmol/L (ref 135–145)
Total Bilirubin: 0.8 mg/dL (ref 0.3–1.2)
Total Protein: 5.8 g/dL — ABNORMAL LOW (ref 6.5–8.1)

## 2020-09-10 LAB — PROTIME-INR
INR: 3.1 — ABNORMAL HIGH (ref 0.8–1.2)
Prothrombin Time: 30.7 seconds — ABNORMAL HIGH (ref 11.4–15.2)

## 2020-09-10 LAB — MAGNESIUM: Magnesium: 2.2 mg/dL (ref 1.7–2.4)

## 2020-09-10 MED ORDER — PHYTONADIONE 5 MG PO TABS
2.5000 mg | ORAL_TABLET | Freq: Once | ORAL | Status: DC
  Filled 2020-09-10: qty 1

## 2020-09-10 NOTE — Plan of Care (Signed)

## 2020-09-10 NOTE — Progress Notes (Addendum)
ANTICOAGULATION CONSULT NOTE - Initial Consult  Pharmacy Consult for heparin Indication: Mechanical valve- on Coumadin  Allergies  Allergen Reactions  . Codeine Nausea And Vomiting  . Penicillins Other (See Comments)    Unknown reaction - listed on MAR Has patient had a PCN reaction causing immediate rash, facial/tongue/throat swelling, SOB or lightheadedness with hypotension: Unknown Has patient had a PCN reaction causing severe rash involving mucus membranes or skin necrosis: Unknown Has patient had a PCN reaction that required hospitalization: Unknown Has patient had a PCN reaction occurring within the last 10 years: Unknown If all of the above answers are "NO", then may proceed with Cephalosporin use.    Patient Measurements: Height: 5\' 2"  (157.5 cm) Weight: 50.3 kg (111 lb) IBW/kg (Calculated) : 50.1 Heparin Dosing Weight: 50 kg  Vital Signs: Temp: 97.6 F (36.4 C) (10/27 0528) Temp Source: Oral (10/27 0528) BP: 157/53 (10/27 0528) Pulse Rate: 64 (10/27 0528)  Labs: Recent Labs    09/08/20 1300 09/08/20 1300 09/08/20 1524 09/09/20 0545 09/10/20 0324  HGB 10.5*   < >  --  9.3* 8.8*  HCT 32.8*  --   --  29.8* 28.4*  PLT 175  --   --  158 160  LABPROT 26.4*  --   --  26.6* 30.7*  INR 2.5*  --   --  2.6* 3.1*  CREATININE 1.21*  --   --  1.21* 1.17*  TROPONINIHS 44*  --  43*  --   --    < > = values in this interval not displayed.    Estimated Creatinine Clearance: 24.8 mL/min (A) (by C-G formula based on SCr of 1.17 mg/dL (H)).   Medical History: Past Medical History:  Diagnosis Date  . Anemia   . Anxiety   . Arthritis   . Blood transfusion without reported diagnosis   . Carotid artery stenosis    Without infarction  . Cataract   . Chronic combined systolic (congestive) and diastolic (congestive) heart failure (HCC)   . COPD (chronic obstructive pulmonary disease) (HCC)   . Coronary artery disease   . Heart valve replaced by other means   .  Hypercholesterolemia    Pure  . Hypertension    Unspecified  . LBBB (left bundle branch block)   . Macular degeneration (senile) of retina, unspecified   . On home O2   . Postsurgical aortocoronary bypass status   . Stroke (HCC)   . Transient global amnesia   . Unspecified hereditary and idiopathic peripheral neuropathy   . Unspecified vitamin D deficiency     Medications:  Medications Prior to Admission  Medication Sig Dispense Refill Last Dose  . acetaminophen (TYLENOL) 650 MG CR tablet Take 650 mg by mouth every 8 (eight) hours as needed for pain.   Past Month at Unknown time  . ALPRAZolam (XANAX) 0.25 MG tablet Take 0.125-0.25 mg by mouth at bedtime as needed for anxiety.    09/07/2020 at Unknown time  . ANORO ELLIPTA 62.5-25 MCG/INH AEPB USE 1 INHALATION ONCE DAILY. 60 each 11 09/08/2020 at Unknown time  . atorvastatin (LIPITOR) 40 MG tablet TAKE ONE TABLET BY MOUTH ONCE DAILY. 90 tablet 3 09/07/2020 at Unknown time  . baclofen (LIORESAL) 10 MG tablet Take 10 mg by mouth as needed for muscle spasms.   unknown  . carvedilol (COREG) 3.125 MG tablet Take 1 tablet (3.125 mg total) by mouth 2 (two) times daily with a meal. (Patient taking differently: Take 6.25 mg by mouth 2 (  two) times daily with a meal. Pt takes 6.5 mg twice a day.) 180 tablet 3 09/08/2020 at 200  . docusate sodium (COLACE) 100 MG capsule Take 100 mg by mouth every evening.    unknown  . donepezil (ARICEPT) 10 MG tablet TAKE ONE TABLET BY MOUTH ONCE DAILY. (Patient taking differently: Take 10 mg by mouth at bedtime. ) 30 tablet 0 09/07/2020 at Unknown time  . ergocalciferol (VITAMIN D2) 50000 UNITS capsule Take 50,000 Units by mouth every Tuesday.    Past Week at Unknown time  . ferrous sulfate 325 (65 FE) MG EC tablet Take 325 mg by mouth daily.    09/08/2020 at Unknown time  . furosemide (LASIX) 40 MG tablet TAKE ONE TABLET BY MOUTH ONCE DAILY. 90 tablet 3 09/08/2020 at Unknown time  . HYDROcodone-acetaminophen  (NORCO) 7.5-325 MG tablet Take 1 tablet by mouth every 6 (six) hours as needed for moderate pain.   unknown  . levalbuterol (XOPENEX) 0.63 MG/3ML nebulizer solution USE 1 VIAL VIA NEBULIZER EVERY 8 HOURS AS NEEDED. 144 mL 3 09/07/2020 at Unknown time  . meclizine (ANTIVERT) 25 MG tablet Take 25 mg by mouth 3 (three) times daily as needed for dizziness.   Past Month at Unknown time  . Melatonin 3 MG TABS Take 1 tablet by mouth at bedtime.   09/07/2020 at Unknown time  . methylPREDNISolone (MEDROL) 4 MG tablet Take 4 mg by mouth 2 (two) times daily as needed.     . multivitamin-lutein (OCUVITE-LUTEIN) CAPS capsule Take 1 capsule by mouth daily.   09/08/2020 at Unknown time  . ondansetron (ZOFRAN) 4 MG tablet Take 4 mg by mouth every 8 (eight) hours as needed for nausea or vomiting.   unknown  . polyethylene glycol (MIRALAX / GLYCOLAX) 17 g packet Take 17 g by mouth daily as needed for moderate constipation.   unknown  . pregabalin (LYRICA) 75 MG capsule TAKE 1 CAPSULE BY MOUTH EVERY OTHER DAY. 15 capsule 5 09/06/2020  . spironolactone (ALDACTONE) 25 MG tablet TAKE (1/2) TABLET BY MOUTH DAILY. (Patient taking differently: Take 12.5 mg by mouth daily. ) 45 tablet 6 09/08/2020 at Unknown time  . warfarin (COUMADIN) 5 MG tablet TAKE 1/2 TO 1 TABLET ONCE DAILY AS DIRECTED BY COUMADIN CLINIC. (Patient taking differently: Take 5 mg by mouth See admin instructions. Take 1/2 tablet on Monday, Wednesday and Friday and 1 whole tablet on Tues,Thursday, Saturday and Sunday.) 90 tablet 1 09/07/2020 at 1700  . zinc sulfate 50 MG CAPS capsule Take 50 mg by mouth 2 (two) times daily.    09/08/2020 at Unknown time  . hydrocortisone (ANUSOL-HC) 2.5 % rectal cream Place 1 application rectally 3 (three) times daily as needed for hemorrhoids or anal itching.     . traZODone (DESYREL) 50 MG tablet Take 0.5-1 tablets (25-50 mg total) by mouth at bedtime as needed for sleep. (Patient not taking: Reported on 09/08/2020) 30 tablet  3 Not Taking at Unknown time    Assessment: Pharmacy consulted to dose heparin in patient with mechanical aortic valve.  Patient takes warfarin prior to admission with home dose of 2.5 mg MWF and 5 mg ROW.  Per Anti-coag visit notes, INR goal of 2.2-3.0.  Goal of Therapy:  INR 2.2-3.0 Monitor platelets by anticoagulation protocol: Yes   Plan:  Will monitor daily INR and start heparin once INR is less than 2.2.  Tad Moore 09/10/2020,12:53 PM

## 2020-09-10 NOTE — Progress Notes (Signed)
Subjective:  Patient denies abdominal pain.  She says she got sick when she took her medication on empty stomach.  She is hungry.  She denies chest pain or shortness of breath.   Current Medications:  Current Facility-Administered Medications:  .  acetaminophen (TYLENOL) tablet 650 mg, 650 mg, Oral, Q6H PRN, 650 mg at 09/09/20 0545 **OR** acetaminophen (TYLENOL) suppository 650 mg, 650 mg, Rectal, Q6H PRN, Zierle-Ghosh, Asia B, DO .  ALPRAZolam (XANAX) tablet 0.125-0.25 mg, 0.125-0.25 mg, Oral, QHS PRN, Zierle-Ghosh, Asia B, DO, 0.25 mg at 09/09/20 2317 .  carvedilol (COREG) tablet 3.125 mg, 3.125 mg, Oral, BID WC, Zierle-Ghosh, Asia B, DO, 3.125 mg at 09/10/20 0939 .  ciprofloxacin (CIPRO) IVPB 400 mg, 400 mg, Intravenous, Q24H, Zierle-Ghosh, Asia B, DO, Stopped at 09/10/20 1040 .  donepezil (ARICEPT) tablet 10 mg, 10 mg, Oral, Daily, Zierle-Ghosh, Asia B, DO, 10 mg at 09/10/20 0939 .  ferrous sulfate tablet 325 mg, 325 mg, Oral, Daily, Zierle-Ghosh, Asia B, DO, 325 mg at 09/10/20 0939 .  ondansetron (ZOFRAN) tablet 4 mg, 4 mg, Oral, Q6H PRN **OR** ondansetron (ZOFRAN) injection 4 mg, 4 mg, Intravenous, Q6H PRN, Zierle-Ghosh, Asia B, DO, 4 mg at 09/10/20 1154 .  pregabalin (LYRICA) capsule 75 mg, 75 mg, Oral, Daily, Zierle-Ghosh, Asia B, DO, 75 mg at 09/10/20 0939 .  spironolactone (ALDACTONE) tablet 12.5 mg, 12.5 mg, Oral, Daily, Zierle-Ghosh, Asia B, DO, 12.5 mg at 09/10/20 0940 .  umeclidinium-vilanterol (ANORO ELLIPTA) 62.5-25 MCG/INH 1 puff, 1 puff, Inhalation, Daily, Zierle-Ghosh, Asia B, DO, 1 puff at 09/10/20 0813   Objective: Blood pressure (!) 160/55, pulse (!) 54, temperature 97.6 F (36.4 C), temperature source Oral, resp. rate 18, height 5' 2" (1.575 m), weight 50.3 kg, SpO2 99 %. Patient is alert and in no acute distress. Abdomen symmetrical.  Bowel sounds are normal.  On palpation is soft and nontender with no organomegaly or masses. No LE edema or clubbing  noted.  Labs/studies Results:  CBC Latest Ref Rng & Units 09/10/2020 09/09/2020 09/08/2020  WBC 4.0 - 10.5 K/uL 6.7 6.2 8.5  Hemoglobin 12.0 - 15.0 g/dL 8.8(L) 9.3(L) 10.5(L)  Hematocrit 36 - 46 % 28.4(L) 29.8(L) 32.8(L)  Platelets 150 - 400 K/uL 160 158 175    CMP Latest Ref Rng & Units 09/10/2020 09/09/2020 09/08/2020  Glucose 70 - 99 mg/dL 75 84 112(H)  BUN 8 - 23 mg/dL 15 21 24(H)  Creatinine 0.44 - 1.00 mg/dL 1.17(H) 1.21(H) 1.21(H)  Sodium 135 - 145 mmol/L 141 142 142  Potassium 3.5 - 5.1 mmol/L 3.9 3.3(L) 3.8  Chloride 98 - 111 mmol/L 106 100 100  CO2 22 - 32 mmol/L 31 31 31  Calcium 8.9 - 10.3 mg/dL 8.6(L) 8.9 9.4  Total Protein 6.5 - 8.1 g/dL 5.8(L) 6.1(L) 7.1  Total Bilirubin 0.3 - 1.2 mg/dL 0.8 0.8 0.9  Alkaline Phos 38 - 126 U/L 56 62 72  AST 15 - 41 U/L 27 36 82(H)  ALT 0 - 44 U/L 17 23 32    Hepatic Function Latest Ref Rng & Units 09/10/2020 09/09/2020 09/08/2020  Total Protein 6.5 - 8.1 g/dL 5.8(L) 6.1(L) 7.1  Albumin 3.5 - 5.0 g/dL 3.2(L) 3.5 4.1  AST 15 - 41 U/L 27 36 82(H)  ALT 0 - 44 U/L 17 23 32  Alk Phosphatase 38 - 126 U/L 56 62 72  Total Bilirubin 0.3 - 1.2 mg/dL 0.8 0.8 0.9  Bilirubin, Direct <=0.2 mg/dL - - -     Assessment:  #  1.  Choledocholithiasis.  She presented with biliary colic.  AST was mildly elevated on admission with normal ALT and since then transaminases have remained normal.  Clinically she does not have cholangitis but given her age it is appropriate to keep her on Cipro until she can undergo therapeutic ERCP.  Would like to see INR of 1.5 or so prior to biliary sphincterotomy.  #2.  Anemia.  Possibly has anemia of chronic disease.  She had iron studies and normal folate and B12 levels in August 2019.  She may have an element of hemolysis secondary to aortic valve disease.  #3.  History of aortic valve replacement.  Patient is on anticoagulant and INR is 3.1.  #4.  History of cardiomyopathy.  She has a history of low EF but echo  yesterday revealed EF of 55 to 60%.  Dilated left and right atria.  Recommendations  DC ferrous sulfate for now. Diet advanced to full liquids. Serum haptoglobin with a.m. lab. Therapeutic ERCP to be performed at [facility]. She may need vitamin K to reverse coagulopathy which can be undertaken at: When patient is transferred        

## 2020-09-10 NOTE — Progress Notes (Signed)
PROGRESS NOTE    [name]  ZWC:[id number] DOB: [birth year] DOA: 09/08/2020 PCP: Oval Linsey, MD   Brief Narrative: [name] is a 86 y.o. female,with history of vitamin D deficiency, transient global amnesia, stroke, COPD on 4 L nasal cannula at home, left bundle branch block, hypertension, hyperlipidemia, coronary artery disease, chronic combined systolic and diastolic heart failure, carotid artery stenosis, mechanical heart valve. Patient presented secondary abdominal pain with concern for acute cholecystitis and choledocholithiasis. Plan for ERCP.   Assessment & Plan:   Active Problems:   Essential hypertension   LBBB (left bundle branch block)   S/P AVR (aortic valve replacement)   Chronic combined systolic and diastolic CHF (congestive heart failure) (HCC)   Choledocholithiasis  Acute choledocholithiasis Acute cholecystitis Patient evaluated by general surgery and gastroenterology. No plan for cholecystectomy at this time. GI recommending ERCP. Patient awaiting transfer to [facility]. -Continue Levaquin -Clear liquid diet today since there continues to be no beds; NPO after midnight -Goal INR <2 for ERCP per GI  Elevated troponin Mildly elevated. No chest pain. Stable. LBBB on EKG, however this is unchanged from March 2021.  Prolonged QT Likely overstated in setting of LBBB.  COPD  Stable. -Continue Anoro ellipta   Chronic hypoxia Stable.  Hyperlipidemia On Lipitor as an outpatient.  Mechanical aortic valve Could not be verified on recent Transthoracic Echocardiogram. Transthoracic Echocardiogram from 2018 confirms mechanical valve replacement. Patient is on Coumadin as an outpatient. Goal INR of 2.2-3.0 per outpatient Coumadin Clinic note. -Heparin drip for INR <2.2 -Vitamin K 2.5 mg x1 on 10/27 for upward trending INR  Chronic systolic heart failure Previous EF of 25%. Transthoracic Echocardiogram obtained 10/26 and significant  for an EF of 55-60%. Currently euvolemic. -Continue spironolactone  Iron deficiency anemia -Continue iron supplementation  CKD stage IIIb Stable.  LBBB Stale.  Anxiety -Continue Xanax prn  Dementia -Continue Aricept   DVT prophylaxis: Heparin drip when INR >2.5 Code Status:   Code Status: DNR Family Communication: Daughter at bedside Disposition Plan: Transfer to [facility] for ERCP. Likely discharge home in 2-3 days post-procedure.   Consultants:   General surgery  Gastroenterology  Procedures:   None  Antimicrobials:  Ciprofloxacin    Subjective: No abdominal pain, nausea, vomiting. No chest pain or dyspnea.  Objective: Vitals:   09/09/20 2026 09/10/20 0528 09/10/20 0817 09/10/20 1358  BP: 116/77 (!) 157/53  (!) 160/55  Pulse: 62 64  (!) 54  Resp: 20 16  18   Temp: 98 F (36.7 C) 97.6 F (36.4 C)  97.6 F (36.4 C)  TempSrc: Oral Oral  Oral  SpO2: 100% 100% 99% 99%  Weight:      Height:        Intake/Output Summary (Last 24 hours) at 09/10/2020 1527 Last data filed at 09/09/2020 2317 Gross per 24 hour  Intake 1250 ml  Output --  Net 1250 ml   Filed Weights   09/08/20 1227  Weight: 50.3 kg    Examination:  General exam: Appears calm and comfortable Respiratory system: Clear to auscultation. Respiratory effort normal. Cardiovascular system: S1 & S2 heard. Normal rate and regular rhythm. 2/6 systolic murmur with mechanical click heard. Gastrointestinal system: Abdomen is nondistended, soft and nontender. No organomegaly or masses felt. Normal bowel sounds heard. Central nervous system: Alert and oriented. No focal neurological deficits. Musculoskeletal: No edema. No calf tenderness Skin: No cyanosis. No rashes Psychiatry: Judgement and insight appear normal. Mood & affect appropriate.  Data Reviewed: I have personally reviewed following labs and imaging studies  CBC Lab Results  Component Value Date   WBC 6.7 09/10/2020    RBC 2.81 (L) 09/10/2020   HGB 8.8 (L) 09/10/2020   HCT 28.4 (L) 09/10/2020   MCV 101.1 (H) 09/10/2020   MCH 31.3 09/10/2020   PLT 160 09/10/2020   MCHC 31.0 09/10/2020   RDW 13.2 09/10/2020   LYMPHSABS 1.2 09/09/2020   MONOABS 0.7 09/09/2020   EOSABS 0.6 (H) 09/09/2020   BASOSABS 0.0 09/09/2020     Last metabolic panel Lab Results  Component Value Date   NA 141 09/10/2020   K 3.9 09/10/2020   CL 106 09/10/2020   CO2 31 09/10/2020   BUN 15 09/10/2020   CREATININE 1.17 (H) 09/10/2020   GLUCOSE 75 09/10/2020   GFRNONAA 44 (L) 09/10/2020   GFRAA 42 (L) 08/19/2020   CALCIUM 8.6 (L) 09/10/2020   PHOS 2.8 07/21/2018   PROT 5.8 (L) 09/10/2020   ALBUMIN 3.2 (L) 09/10/2020   BILITOT 0.8 09/10/2020   ALKPHOS 56 09/10/2020   AST 27 09/10/2020   ALT 17 09/10/2020   ANIONGAP 4 (L) 09/10/2020    CBG (last 3)  No results for input(s): GLUCAP in the last 72 hours.   GFR: Estimated Creatinine Clearance: 24.8 mL/min (A) (by C-G formula based on SCr of 1.17 mg/dL (H)).  Coagulation Profile: Recent Labs  Lab 09/08/20 1300 09/09/20 0545 09/10/20 0324  INR 2.5* 2.6* 3.1*    Recent Results (from the past 240 hour(s))  Respiratory Panel by RT PCR (Flu A&B, Covid) - Nasopharyngeal Swab     Status: None   Collection Time: 09/08/20  8:22 PM   Specimen: Nasopharyngeal Swab  Result Value Ref Range Status   SARS Coronavirus 2 by RT PCR NEGATIVE NEGATIVE Final    Comment: (NOTE) SARS-CoV-2 target nucleic acids are NOT DETECTED.  The SARS-CoV-2 RNA is generally detectable in upper respiratoy specimens during the acute phase of infection. The lowest concentration of SARS-CoV-2 viral copies this assay can detect is 131 copies/mL. A negative result does not preclude SARS-Cov-2 infection and should not be used as the sole basis for treatment or other patient management decisions. A negative result may occur with  improper specimen collection/handling, submission of specimen other than  nasopharyngeal swab, presence of viral mutation(s) within the areas targeted by this assay, and inadequate number of viral copies (<131 copies/mL). A negative result must be combined with clinical observations, patient history, and epidemiological information. The expected result is Negative.  Fact Sheet for Patients:  https://www.moore.com/  Fact Sheet for Healthcare Providers:  https://www.young.biz/  This test is no t yet approved or cleared by the [place] FDA and  has been authorized for detection and/or diagnosis of SARS-CoV-2 by FDA under an Emergency Use Authorization (EUA). This EUA will remain  in effect (meaning this test can be used) for the duration of the COVID-19 declaration under Section 564(b)(1) of the Act, 21 U.S.C. section 360bbb-3(b)(1), unless the authorization is terminated or revoked sooner.     Influenza A by PCR NEGATIVE NEGATIVE Final   Influenza B by PCR NEGATIVE NEGATIVE Final    Comment: (NOTE) The Xpert Xpress SARS-CoV-2/FLU/RSV assay is intended as an aid in  the diagnosis of influenza from Nasopharyngeal swab specimens and  should not be used as a sole basis for treatment. Nasal washings and  aspirates are unacceptable for Xpert Xpress SARS-CoV-2/FLU/RSV  testing.  Fact Sheet for Patients: https://www.moore.com/  Fact  Sheet for Healthcare Providers: https://www.young.biz/  This test is not yet approved or cleared by the [place] and  has been authorized for detection and/or diagnosis of SARS-CoV-2 by  FDA under an Emergency Use Authorization (EUA). This EUA will remain  in effect (meaning this test can be used) for the duration of the  Covid-19 declaration under Section 564(b)(1) of the Act, 21  U.S.C. section 360bbb-3(b)(1), unless the authorization is  terminated or revoked. Performed at [facility], [address], [place], [place] [address]          Radiology Studies: ECHOCARDIOGRAM COMPLETE  Result Date: 09/09/2020    ECHOCARDIOGRAM REPORT   Patient Name:   [name] Date of Exam: 09/09/2020 Medical Rec #:  [id number]       Height:       62.0 in Accession #:    [id number]      Weight:       111.0 lb Date of Birth:  [birth year]      BSA:          1.488 m Patient Age:    89 years        BP:           134/110 mmHg Patient Gender: F               HR:           63 bpm. [facility] Procedure: 2D Echo, Cardiac Doppler and Color Doppler Indications:    Pre-operative cardiovascular examination V72.81 / Z01.810  History:        Patient has prior history of Echocardiogram examinations. CAD,                 Stroke; Risk Factors:Hypertension and Dyslipidemia. Aortic valve                 replacement.  Sonographer:    Celesta Gentile RCS Referring Phys: [id number] PRATIK D [facility] IMPRESSIONS  1. Left ventricular ejection fraction, by estimation, is 55 to 60%. The left ventricle has normal function. The left ventricle has no regional wall motion abnormalities. There is moderate left ventricular hypertrophy. Left ventricular diastolic parameters are consistent with Grade I diastolic dysfunction (impaired relaxation). Elevated left atrial pressure.  2. Right ventricular systolic function is normal. The right ventricular size is normal.  3. Left atrial size was severely dilated.  4. Right atrial size was mildly dilated.  5. The mitral valve is abnormal. Mild to moderate mitral valve regurgitation. No evidence of mitral stenosis. Moderate mitral annular calcification.  6. History of AV replacement, unknown type. . The aortic valve has been repaired/replaced. There is moderate calcification of the aortic valve. There is moderate thickening of the aortic valve. Aortic valve regurgitation is moderate. No aortic stenosis is present.  7. The inferior vena cava is normal in size with greater than 50% respiratory variability, suggesting right atrial pressure  of 3 mmHg. FINDINGS  Left Ventricle: Left ventricular ejection fraction, by estimation, is 55 to 60%. The left ventricle has normal function. The left ventricle has no regional wall motion abnormalities. The left ventricular internal cavity size was normal in size. There is  moderate left ventricular hypertrophy. Left ventricular diastolic parameters are consistent with Grade I diastolic dysfunction (impaired relaxation). Elevated left atrial pressure. Right Ventricle: The right ventricular size is normal. No increase in right ventricular wall thickness. Right ventricular systolic function is normal. Left Atrium: Left atrial size was severely dilated. Right Atrium:  Right atrial size was mildly dilated. Pericardium: There is no evidence of pericardial effusion. Mitral Valve: The mitral valve is abnormal. There is mild thickening of the mitral valve leaflet(s). There is mild calcification of the mitral valve leaflet(s). Moderate mitral annular calcification. Mild to moderate mitral valve regurgitation. No evidence of mitral valve stenosis. Tricuspid Valve: The tricuspid valve is normal in structure. Tricuspid valve regurgitation is trivial. No evidence of tricuspid stenosis. Aortic Valve: History of AV replacement, unknown type. The aortic valve has been repaired/replaced. There is moderate calcification of the aortic valve. There is moderate thickening of the aortic valve. There is moderate aortic valve annular calcification. Aortic valve regurgitation is moderate. No aortic stenosis is present. Pulmonic Valve: The pulmonic valve was not well visualized. Pulmonic valve regurgitation is not visualized. No evidence of pulmonic stenosis. Aorta: The aortic root is normal in size and structure. Pulmonary Artery: Indeterminant PASP, inadequate TR jet. Venous: The inferior vena cava is normal in size with greater than 50% respiratory variability, suggesting right atrial pressure of 3 mmHg. IAS/Shunts: No atrial level shunt  detected by color flow Doppler.  LEFT VENTRICLE PLAX 2D LVIDd:         5.11 cm      Diastology LVIDs:         3.09 cm      LV e' medial:    4.79 cm/s LV PW:         1.25 cm      LV E/e' medial:  26.1 LV IVS:        1.22 cm      LV e' lateral:   4.13 cm/s LVOT diam:     1.50 cm      LV E/e' lateral: 30.3 LV SV:         45 LV SV Index:   31 LVOT Area:     1.77 cm  LV Volumes (MOD) LV vol d, MOD A2C: 143.0 ml LV vol d, MOD A4C: 161.0 ml LV vol s, MOD A2C: 54.4 ml LV vol s, MOD A4C: 70.0 ml LV SV MOD A2C:     88.6 ml LV SV MOD A4C:     161.0 ml LV SV MOD BP:      91.6 ml RIGHT VENTRICLE RV S prime:     9.68 cm/s TAPSE (M-mode): 1.9 cm LEFT ATRIUM             Index       RIGHT ATRIUM           Index LA diam:        4.00 cm 2.69 cm/m  RA Area:     15.50 cm LA Vol (A2C):   87.8 ml 58.99 ml/m RA Volume:   45.80 ml  30.77 ml/m LA Vol (A4C):   88.4 ml 59.39 ml/m LA Biplane Vol: 89.3 ml 59.99 ml/m  AORTIC VALVE LVOT Vmax:   96.70 cm/s LVOT Vmean:  57.100 cm/s LVOT VTI:    0.257 m  AORTA Ao Root diam: 3.00 cm MITRAL VALVE MV Area (PHT): 3.21 cm      SHUNTS MV Decel Time: 236 msec      Systemic VTI:  0.26 m MR Peak grad:    146.4 mmHg  Systemic Diam: 1.50 cm MR Mean grad:    91.0 mmHg MR Vmax:         605.00 cm/s MR Vmean:        443.0 cm/s MR PISA:  3.08 cm MR PISA Eff ROA: 16 mm MR PISA Radius:  0.70 cm MV E velocity: 125.00 cm/s MV A velocity: 144.00 cm/s MV E/A ratio:  0.87 Dina Rich MD Electronically signed by Dina Rich MD Signature Date/Time: 09/09/2020/4:14:42 PM    Final    CT Angio Chest/Abd/Pel for Dissection W and/or Wo Contrast  Result Date: 09/08/2020 CLINICAL DATA:  Chest pain and back pain. Concern for aortic dissection. EXAM: CT ANGIOGRAPHY CHEST, ABDOMEN AND PELVIS TECHNIQUE: Non-contrast CT of the chest was initially obtained. Multidetector CT imaging through the chest, abdomen and pelvis was performed using the standard protocol during bolus administration of intravenous  contrast. Multiplanar reconstructed images and MIPs were obtained and reviewed to evaluate the vascular anatomy. CONTRAST:  80mL OMNIPAQUE IOHEXOL 350 MG/ML SOLN COMPARISON:  CT chest dated October 25, 2019. FINDINGS: CTA CHEST FINDINGS Cardiovascular: There are advanced vascular calcifications throughout the thoracic aorta. There is no evidence for a thoracic aortic dissection. The heart is enlarged. There are coronary artery calcifications. There is no definite large centrally located pulmonary embolism, however detection of pulmonary emboli is severely limited by contrast timing. The arch vessels are patent where visualized. Mediastinum/Nodes: -- No mediastinal lymphadenopathy. -- No hilar lymphadenopathy. -- No axillary lymphadenopathy. -- No supraclavicular lymphadenopathy. -- Normal thyroid gland where visualized. -  Unremarkable esophagus. Lungs/Pleura: Mild to moderate emphysematous changes are noted bilaterally there is atelectasis at the lung bases. There is a new linear opacity in the right middle lobe favored to represent an area atelectasis. There is no pneumothorax or large pleural effusion. The trachea is secondary to respiratory motion artifact. Musculoskeletal: No chest wall abnormality. No bony spinal canal stenosis. Review of the MIP images confirms the above findings. CTA ABDOMEN AND PELVIS FINDINGS VASCULAR Aorta: There are atherosclerotic changes throughout the abdominal aorta. There is an infrarenal abdominal aortic aneurysm measuring approximately 2.9 x 3.1 cm. Celiac: There is severe narrowing at the origin of the celiac axis. The remaining portions are widely patent. SMA: There is moderate narrowing at the origin of SMA. Renals: Both renal arteries are patent without evidence of aneurysm, dissection, vasculitis, fibromuscular dysplasia or significant stenosis. IMA: Patent without evidence of aneurysm, dissection, vasculitis or significant stenosis. Inflow: Patent without evidence of  aneurysm, dissection, vasculitis or significant stenosis. Veins: No obvious venous abnormality within the limitations of this arterial phase study. Review of the MIP images confirms the above findings. NON-VASCULAR Hepatobiliary: The liver is normal. The gallbladder is distended with mild gallbladder wall thickening and pericholecystic free fluid.There is moderate to severe intrahepatic and extrahepatic biliary ductal dilatation. There appears to be an abnormal filling defect within the distal common bile duct. Pancreas: There is pancreatic ductal dilatation. There is no definite pancreatic mass. Spleen: Unremarkable. Adrenals/Urinary Tract: --Adrenal glands: Unremarkable. --Right kidney/ureter: No hydronephrosis or radiopaque kidney stones. --Left kidney/ureter: No hydronephrosis or radiopaque kidney stones. --Urinary bladder: Unremarkable. Stomach/Bowel: --Stomach/Duodenum: No hiatal hernia or other gastric abnormality. Normal duodenal course and caliber. --Small bowel: Unremarkable. --Colon: Rectosigmoid diverticulosis without acute inflammation. --Appendix: Not visualized. No right lower quadrant inflammation or free fluid. Lymphatic: --No retroperitoneal lymphadenopathy. --No mesenteric lymphadenopathy. --No pelvic or inguinal lymphadenopathy. Reproductive: Unremarkable Other: No ascites or free air. There are bilateral fat containing inguinal hernias. The hernia on the right contains a loop of small bowel without evidence for an obstruction. Musculoskeletal. There are old healed right pelvic fractures. The patient is status post prior percutaneous pinning of the right hip. The there is no definite acute displaced fracture.  Detection of nondisplaced fractures is limited by osteopenia. Review of the MIP images confirms the above findings. IMPRESSION: 1. No evidence of thoracic or abdominal aortic dissection. 2. There is an infrarenal abdominal aortic aneurysm measuring approximately 2.9 x 3.1 cm. Recommend  follow-up every 3 years. This recommendation follows ACR consensus guidelines: White Paper of the ACR Incidental Findings Committee II on Vascular Findings. J Am Coll Radiol 2013; 10:789-794. 3. There is moderate to severe intrahepatic and extrahepatic biliary ductal dilatation with an abnormal filling defect within the distal common bile duct. Correlation with MRCP/ERCP is recommended. The gallbladder is distended with gallbladder wall thickening pericholecystic free fluid. If there is clinical suspicion for acute cholecystitis, follow-up with ultrasound is recommended. 4. Rectosigmoid diverticulosis without acute inflammation. 5. Bilateral fat containing inguinal hernias. The hernia on the right contains a loop of small bowel without evidence for obstruction. 6. Cardiomegaly and coronary artery calcifications. Aortic Atherosclerosis (ICD10-I70.0) and Emphysema (ICD10-J43.9). Electronically Signed   By: Katherine Mantlehristopher  Green M.D.   On: 09/08/2020 16:36   US Abdomen Limited RUQ (LIVER/GB)  Result Date: 09/08/2020 CLINICAL DATA:  Right upper quadrant abdominal pain. EXAM: ULTRASOUND ABDOMEN LIMITED RIGHT UPPER QUADRANT COMPARISON:  None. FINDINGS: Gallbladder: Gallbladder sludge and multiple shadowing echogenic gallstones are seen within the gallbladder lumen. The largest measures approximately 1.2 cm. The gallbladder wall measures 3.0 mm. A mild amount of pericholecystic fluid is noted. No sonographic Murphy sign noted by sonographer. Common bile duct: Diameter: 13.0 A 1.2 cm echogenic focus is seen within the lumen of the distal common bile duct. Liver: No focal lesion identified. There is diffusely increased echogenicity of the liver parenchyma. Portal vein is patent on color Doppler imaging with normal direction of blood flow towards the liver. Other: None. IMPRESSION: 1. Cholelithiasis and gallbladder sludge. 2. 1.2 cm gallstone within the distal aspect of a dilated common bile duct. 3. Fatty liver.  Electronically Signed   By: Aram Candelahaddeus  Houston M.D.   On: 09/08/2020 17:28        Scheduled Meds: . carvedilol  3.125 mg Oral BID WC  . donepezil  10 mg Oral Daily  . ferrous sulfate  325 mg Oral Daily  . pregabalin  75 mg Oral Daily  . spironolactone  12.5 mg Oral Daily  . umeclidinium-vilanterol  1 puff Inhalation Daily   Continuous Infusions: . ciprofloxacin 400 mg (09/10/20 0939)     LOS: 2 days     Jacquelin Hawkingalph Rawley Harju, MD [facility] 09/10/2020, 3:27 PM  If 7PM-7AM, please contact night-coverage [email or web address]

## 2020-09-11 DIAGNOSIS — K8050 Calculus of bile duct without cholangitis or cholecystitis without obstruction: Principal | ICD-10-CM

## 2020-09-11 DIAGNOSIS — Z952 Presence of prosthetic heart valve: Secondary | ICD-10-CM | POA: Diagnosis not present

## 2020-09-11 DIAGNOSIS — R1011 Right upper quadrant pain: Secondary | ICD-10-CM

## 2020-09-11 DIAGNOSIS — K8020 Calculus of gallbladder without cholecystitis without obstruction: Secondary | ICD-10-CM | POA: Diagnosis not present

## 2020-09-11 DIAGNOSIS — D539 Nutritional anemia, unspecified: Secondary | ICD-10-CM

## 2020-09-11 DIAGNOSIS — I10 Essential (primary) hypertension: Secondary | ICD-10-CM | POA: Diagnosis not present

## 2020-09-11 LAB — CBC
HCT: 28.8 % — ABNORMAL LOW (ref 36.0–46.0)
Hemoglobin: 9.1 g/dL — ABNORMAL LOW (ref 12.0–15.0)
MCH: 31.2 pg (ref 26.0–34.0)
MCHC: 31.6 g/dL (ref 30.0–36.0)
MCV: 98.6 fL (ref 80.0–100.0)
Platelets: 158 10*3/uL (ref 150–400)
RBC: 2.92 MIL/uL — ABNORMAL LOW (ref 3.87–5.11)
RDW: 13 % (ref 11.5–15.5)
WBC: 6.7 10*3/uL (ref 4.0–10.5)
nRBC: 0 % (ref 0.0–0.2)

## 2020-09-11 LAB — COMPREHENSIVE METABOLIC PANEL
ALT: 18 U/L (ref 0–44)
AST: 27 U/L (ref 15–41)
Albumin: 3.2 g/dL — ABNORMAL LOW (ref 3.5–5.0)
Alkaline Phosphatase: 57 U/L (ref 38–126)
Anion gap: 10 (ref 5–15)
BUN: 10 mg/dL (ref 8–23)
CO2: 29 mmol/L (ref 22–32)
Calcium: 8.8 mg/dL — ABNORMAL LOW (ref 8.9–10.3)
Chloride: 103 mmol/L (ref 98–111)
Creatinine, Ser: 1.32 mg/dL — ABNORMAL HIGH (ref 0.44–1.00)
GFR, Estimated: 38 mL/min — ABNORMAL LOW (ref >60)
Glucose, Bld: 106 mg/dL — ABNORMAL HIGH (ref 70–99)
Potassium: 3.9 mmol/L (ref 3.5–5.1)
Sodium: 142 mmol/L (ref 135–145)
Total Bilirubin: 0.8 mg/dL (ref 0.3–1.2)
Total Protein: 5.8 g/dL — ABNORMAL LOW (ref 6.5–8.1)

## 2020-09-11 LAB — PROTIME-INR
INR: 2.8 — ABNORMAL HIGH (ref 0.8–1.2)
Prothrombin Time: 28.7 seconds — ABNORMAL HIGH (ref 11.4–15.2)

## 2020-09-11 LAB — VITAMIN B12: Vitamin B-12: 317 pg/mL (ref 180–914)

## 2020-09-11 NOTE — Plan of Care (Signed)

## 2020-09-11 NOTE — Progress Notes (Signed)
[facility]  PROGRESS NOTE  ANEL CREIGHTON TIW:[id number] DOB: 01-10-1926 DOA: 09/08/2020 PCP: Oval Linsey, MD   Brief HPI:   86 year old female with history of vitamin D deficiency, transient global amnesia, stroke, COPD on 4 L nasal cannula at home, left bundle branch block, hypertension, hyperlipidemia, CAD, chronic combined systolic and diastolic CHF, carotid artery stenosis, mechanical heart valve.  Patient presents with abdominal pain with concern for acute cholecystitis and choledocholithiasis.  She was seen by general surgery and gastroenterology.  No plans for cholecystectomy, GI recommended ERCP.  Patient was transferred to [facility] as anesthesiologist did not feel comfortable patient to have procedure at [facility] hospital.    Subjective   Patient seen and examined, she denies abdominal pain.  No nausea or vomiting.  INR was elevated at 3.1, she did receive 2.5 mg IV vitamin K yesterday.   Assessment/Plan:     1. Choledocholithiasis-patient seen by general surgery and gastroenterology.  She does not have cholecystitis clinically.  Patient started on ciprofloxacin.  GI has seen the patient and plan for ERCP once INR is normalized. 2. Elevated PT/INR-patient is on Coumadin for mechanical aortic valve.  She received vitamin K 2.5 mg IV x1 yesterday for elevated INR of 3.1.  PT/INR is currently pending. 3. Mechanical aortic valve-seen on transthoracic echo from 2018, patient is on Coumadin as outpatient.  Goal of INR 2.2-3.0 as per outpatient Coumadin clinic note.  Start heparin infusion for INR less than 2.2.  She received vitamin K as above.  Check PT/INR today. 4. Understands anemia-continue iron supplementation 5. Hyperlipidemia-continue Lipitor 6. CKD stage IIIb-creatinine stable 7. Dementia-continue Aricept 8. Anxiety-/continue Xanax as needed     COVID-19 Labs  No results for input(s): DDIMER, FERRITIN, LDH, CRP in the last 72 hours.  Lab Results  Component  Value Date   SARSCOV2NAA NEGATIVE 09/08/2020   SARSCOV2NAA NEGATIVE 02/05/2020   SARSCOV2NAA DETECTED (A) 10/20/2019     Scheduled medications:   . carvedilol  3.125 mg Oral BID WC  . donepezil  10 mg Oral Daily  . pregabalin  75 mg Oral Daily  . spironolactone  12.5 mg Oral Daily  . umeclidinium-vilanterol  1 puff Inhalation Daily    SpO2: 93 % O2 Flow Rate (L/min): 3 L/min FiO2 (%): 32 %    CBC: Recent Labs  Lab 09/08/20 1300 09/09/20 0545 09/10/20 0324  WBC 8.5 6.2 6.7  NEUTROABS 6.4 3.8  --   HGB 10.5* 9.3* 8.8*  HCT 32.8* 29.8* 28.4*  MCV 97.6 98.3 101.1*  PLT 175 158 160    Basic Metabolic Panel: Recent Labs  Lab 09/08/20 1300 09/09/20 0545 09/10/20 0324  NA 142 142 141  K 3.8 3.3* 3.9  CL 100 100 106  CO2 31 31 31   GLUCOSE 112* 84 75  BUN 24* 21 15  CREATININE 1.21* 1.21* 1.17*  CALCIUM 9.4 8.9 8.6*  MG  --  2.1 2.2     Liver Function Tests: Recent Labs  Lab 09/08/20 1300 09/09/20 0545 09/10/20 0324  AST 82* 36 27  ALT 32 23 17  ALKPHOS 72 62 56  BILITOT 0.9 0.8 0.8  PROT 7.1 6.1* 5.8*  ALBUMIN 4.1 3.5 3.2*     Antibiotics: Anti-infectives (From admission, onward)   Start     Dose/Rate Route Frequency Ordered Stop   09/10/20 0900  ciprofloxacin (CIPRO) IVPB 400 mg        400 mg 200 mL/hr over 60 Minutes Intravenous Every 24 hours 09/09/20 1041  09/09/20 0800  ciprofloxacin (CIPRO) IVPB 400 mg  Status:  Discontinued        400 mg 200 mL/hr over 60 Minutes Intravenous Every 12 hours 09/08/20 1908 09/09/20 1041   09/08/20 1800  ciprofloxacin (CIPRO) IVPB 400 mg        400 mg 200 mL/hr over 60 Minutes Intravenous  Once 09/08/20 1745 09/08/20 2000       DVT prophylaxis: Coumadin  Code Status: Full code  Family Communication: Discussed with patient's daughter at bedside  Consultants:  Gastroenterology  Procedures:      Objective   Vitals:   09/10/20 1949 09/11/20 0421 09/11/20 0843 09/11/20 0846  BP: (!) 146/54  (!) 147/57 116/78 116/78  Pulse: (!) 56 (!) 56 75 71  Resp: 17 15  18   Temp: 98 F (36.7 C) 98.3 F (36.8 C)  (!) 97.5 F (36.4 C)  TempSrc: Oral Oral  Oral  SpO2: 100% 99% 97% 93%  Weight:      Height:       No intake or output data in the 24 hours ending 09/11/20 1125  10/26 1901 - 10/28 0700 In: 450 [P.O.:250] Out: -   Filed Weights   09/08/20 1227  Weight: 50.3 kg    Physical Examination:   General-appears in no acute distress Heart-S1-S2, regular, no murmur auscultated Lungs-clear to auscultation bilaterally, no wheezing or crackles auscultated Abdomen-soft, nontender, no organomegaly Extremities-no edema in the lower extremities Neuro-alert, oriented x3, no focal deficit noted  Status is: Inpatient  Dispo: The patient is from: Home              Anticipated d/c is to: Home              Anticipated d/c date is: 09/15/2020              Patient currently not medically stable for discharge  Barrier to discharge-awaiting ERCP for choledocholithiasis            Data Reviewed:   Recent Results (from the past 240 hour(s))  Respiratory Panel by RT PCR (Flu A&B, Covid) - Nasopharyngeal Swab     Status: None   Collection Time: 09/08/20  8:22 PM   Specimen: Nasopharyngeal Swab  Result Value Ref Range Status   SARS Coronavirus 2 by RT PCR NEGATIVE NEGATIVE Final    Comment: (NOTE) SARS-CoV-2 target nucleic acids are NOT DETECTED.  The SARS-CoV-2 RNA is generally detectable in upper respiratoy specimens during the acute phase of infection. The lowest concentration of SARS-CoV-2 viral copies this assay can detect is 131 copies/mL. A negative result does not preclude SARS-Cov-2 infection and should not be used as the sole basis for treatment or other patient management decisions. A negative result may occur with  improper specimen collection/handling, submission of specimen other than nasopharyngeal swab, presence of viral mutation(s) within the areas  targeted by this assay, and inadequate number of viral copies (<131 copies/mL). A negative result must be combined with clinical observations, patient history, and epidemiological information. The expected result is Negative.  Fact Sheet for Patients:  09/10/20  Fact Sheet for Healthcare Providers:  https://www.moore.com/  This test is no t yet approved or cleared by the https://www.young.biz/ FDA and  has been authorized for detection and/or diagnosis of SARS-CoV-2 by FDA under an Emergency Use Authorization (EUA). This EUA will remain  in effect (meaning this test can be used) for the duration of the COVID-19 declaration under Section 564(b)(1) of the Act, 21 U.S.C.  section 360bbb-3(b)(1), unless the authorization is terminated or revoked sooner.     Influenza A by PCR NEGATIVE NEGATIVE Final   Influenza B by PCR NEGATIVE NEGATIVE Final    Comment: (NOTE) The Xpert Xpress SARS-CoV-2/FLU/RSV assay is intended as an aid in  the diagnosis of influenza from Nasopharyngeal swab specimens and  should not be used as a sole basis for treatment. Nasal washings and  aspirates are unacceptable for Xpert Xpress SARS-CoV-2/FLU/RSV  testing.  Fact Sheet for Patients: https://www.moore.com/https://www.fda.gov/media/142436/download  Fact Sheet for Healthcare Providers: https://www.young.biz/https://www.fda.gov/media/142435/download  This test is not yet approved or cleared by the [place] FDA and  has been authorized for detection and/or diagnosis of SARS-CoV-2 by  FDA under an Emergency Use Authorization (EUA). This EUA will remain  in effect (meaning this test can be used) for the duration of the  Covid-19 declaration under Section 564(b)(1) of the Act, 21  U.S.C. section 360bbb-3(b)(1), unless the authorization is  terminated or revoked. Performed at [facility], [address] [place], [place] [address]     Recent Labs  Lab 09/08/20 1300  LIPASE 49   No results for  input(s): AMMONIA in the last 168 hours.  Cardiac Enzymes: No results for input(s): CKTOTAL, CKMB, CKMBINDEX, TROPONINI in the last 168 hours. BNP (last 3 results) Recent Labs    02/05/20 0727 02/07/20 1234 02/09/20 0632  BNP 1,712.0* 1,331.0* 386.0*    ProBNP (last 3 results) No results for input(s): PROBNP in the last 8760 hours.  Studies:  ECHOCARDIOGRAM COMPLETE  Result Date: 09/09/2020    ECHOCARDIOGRAM REPORT   Patient Name:   Josephine CablesRUTH E [name] Date of Exam: 09/09/2020 Medical Rec #:  [id number]       Height:       62.0 in Accession #:    [id number]      Weight:       111.0 lb Date of Birth:  1926-10-02      BSA:          1.488 m Patient Age:    89 years        BP:           134/110 mmHg Patient Gender: F               HR:           63 bpm. [facility] Procedure: 2D Echo, Cardiac Doppler and Color Doppler Indications:    Pre-operative cardiovascular examination V72.81 / Z01.810  History:        Patient has prior history of Echocardiogram examinations. CAD,                 Stroke; Risk Factors:Hypertension and Dyslipidemia. Aortic valve                 replacement.  Sonographer:    Celesta GentileBernard White RCS Referring Phys: [id number] [facility] IMPRESSIONS  1. Left ventricular ejection fraction, by estimation, is 55 to 60%. The left ventricle has normal function. The left ventricle has no regional wall motion abnormalities. There is moderate left ventricular hypertrophy. Left ventricular diastolic parameters are consistent with Grade I diastolic dysfunction (impaired relaxation). Elevated left atrial pressure.  2. Right ventricular systolic function is normal. The right ventricular size is normal.  3. Left atrial size was severely dilated.  4. Right atrial size was mildly dilated.  5. The mitral valve is abnormal. Mild to moderate mitral valve regurgitation. No evidence of mitral stenosis. Moderate mitral annular calcification.  6. History of AV replacement, unknown type. . The aortic  valve has been repaired/replaced. There is moderate calcification of the aortic valve. There is moderate thickening of the aortic valve. Aortic valve regurgitation is moderate. No aortic stenosis is present.  7. The inferior vena cava is normal in size with greater than 50% respiratory variability, suggesting right atrial pressure of 3 mmHg. FINDINGS  Left Ventricle: Left ventricular ejection fraction, by estimation, is 55 to 60%. The left ventricle has normal function. The left ventricle has no regional wall motion abnormalities. The left ventricular internal cavity size was normal in size. There is  moderate left ventricular hypertrophy. Left ventricular diastolic parameters are consistent with Grade I diastolic dysfunction (impaired relaxation). Elevated left atrial pressure. Right Ventricle: The right ventricular size is normal. No increase in right ventricular wall thickness. Right ventricular systolic function is normal. Left Atrium: Left atrial size was severely dilated. Right Atrium: Right atrial size was mildly dilated. Pericardium: There is no evidence of pericardial effusion. Mitral Valve: The mitral valve is abnormal. There is mild thickening of the mitral valve leaflet(s). There is mild calcification of the mitral valve leaflet(s). Moderate mitral annular calcification. Mild to moderate mitral valve regurgitation. No evidence of mitral valve stenosis. Tricuspid Valve: The tricuspid valve is normal in structure. Tricuspid valve regurgitation is trivial. No evidence of tricuspid stenosis. Aortic Valve: History of AV replacement, unknown type. The aortic valve has been repaired/replaced. There is moderate calcification of the aortic valve. There is moderate thickening of the aortic valve. There is moderate aortic valve annular calcification. Aortic valve regurgitation is moderate. No aortic stenosis is present. Pulmonic Valve: The pulmonic valve was not well visualized. Pulmonic valve regurgitation is not  visualized. No evidence of pulmonic stenosis. Aorta: The aortic root is normal in size and structure. Pulmonary Artery: Indeterminant PASP, inadequate TR jet. Venous: The inferior vena cava is normal in size with greater than 50% respiratory variability, suggesting right atrial pressure of 3 mmHg. IAS/Shunts: No atrial level shunt detected by color flow Doppler.  LEFT VENTRICLE PLAX 2D LVIDd:         5.11 cm      Diastology LVIDs:         3.09 cm      LV e' medial:    4.79 cm/s LV PW:         1.25 cm      LV E/e' medial:  26.1 LV IVS:        1.22 cm      LV e' lateral:   4.13 cm/s LVOT diam:     1.50 cm      LV E/e' lateral: 30.3 LV SV:         45 LV SV Index:   31 LVOT Area:     1.77 cm  LV Volumes (MOD) LV vol d, MOD A2C: 143.0 ml LV vol d, MOD A4C: 161.0 ml LV vol s, MOD A2C: 54.4 ml LV vol s, MOD A4C: 70.0 ml LV SV MOD A2C:     88.6 ml LV SV MOD A4C:     161.0 ml LV SV MOD BP:      91.6 ml RIGHT VENTRICLE RV S prime:     9.68 cm/s TAPSE (M-mode): 1.9 cm LEFT ATRIUM             Index       RIGHT ATRIUM           Index LA diam:  4.00 cm 2.69 cm/m  RA Area:     15.50 cm LA Vol (A2C):   87.8 ml 58.99 ml/m RA Volume:   45.80 ml  30.77 ml/m LA Vol (A4C):   88.4 ml 59.39 ml/m LA Biplane Vol: 89.3 ml 59.99 ml/m  AORTIC VALVE LVOT Vmax:   96.70 cm/s LVOT Vmean:  57.100 cm/s LVOT VTI:    0.257 m  AORTA Ao Root diam: 3.00 cm MITRAL VALVE MV Area (PHT): 3.21 cm      SHUNTS MV Decel Time: 236 msec      Systemic VTI:  0.26 m MR Peak grad:    146.4 mmHg  Systemic Diam: 1.50 cm MR Mean grad:    91.0 mmHg MR Vmax:         605.00 cm/s MR Vmean:        443.0 cm/s MR PISA:         3.08 cm MR PISA Eff ROA: 16 mm MR PISA Radius:  0.70 cm MV E velocity: 125.00 cm/s MV A velocity: 144.00 cm/s MV E/A ratio:  0.87 Dina Rich MD Electronically signed by Dina Rich MD Signature Date/Time: 09/09/2020/4:14:42 PM    Final        Meredeth Ide   [facility] If 7PM-7AM, please contact night-coverage at  [email or web address], Office  [phone]   09/11/2020, 11:25 AM  LOS: 3 days

## 2020-09-11 NOTE — Care Management Important Message (Signed)
Important Message  Patient Details  Name: VIERA OKONSKI MRN: [id number] Date of Birth: 20-Jan-1926   Medicare Important Message Given:  Yes - Important Message mailed due to current National Emergency  Verbal consent obtained due to current National Emergency  Relationship to patient: Child Contact Name: Lisbeth Ply Call Date: 09/11/20  Time: 1134 Phone: [phone] Outcome: Spoke with contact Important Message mailed to: Patient address on file    Orson Aloe 09/11/2020, 11:34 AM

## 2020-09-11 NOTE — Consult Note (Addendum)
[facility] Consult: 8:50 AM 09/11/2020  LOS: 3 days    Referring Provider: Dr Sharl Ma  Primary Care Physician:  Oval Linsey, MD Primary Gastroenterologist:  Dr  Karilyn Cota    Reason for Consultation:  CBD stone     IMPRESSION:   *   Cholelithiasis, choledocholithiasis.  ? cholecystitis.  Ultrasound showed a normal gallbladder wall and a little bit of fluid so doubt cholecystitis.  Certainly not acting like it with respect to signs and symptoms. On Cipro.    *    Chronic Coumadin for mechanical aortic valve.  Coumadin on hold but INR is 3.1 today.    *    CHF.  Updated echocardiogram 2 days ago shows EF 55 to 60%, grade 1 diastolic dysfunction.  Mild to moderate mitral valve regurgitation and annular calcification.  Repair/replace aortic valve which is moderately thickened with moderate aortic valve regurge.  Troponins elevated at 44 >> 43   *    Stable chronic kidney disease.  *   Macrocytic anemia.  Hgb 8.8, earlier this year measured between 9.6 -10.5.  Takes oral iron at home.    PLAN:     *   ERCP when INR normalizes.    ?  Allow solid, low-fat diet?  Current diet order is full liquids. ?  Give small dose oral versus IV vitamin K to accelerate normalization of INR?   [name]  09/11/2020, 8:50 AM Phone [phone]  I have also evaluated the patient personally with history and physical exam.  Chart and records reviewed including imaging reports and images.  CT scan.  Ultrasound.  She presented with transiently symptomatic choledocholithiasis that she has tolerated well.  She is anticoagulated for a prosthetic aortic valve and her INR has risen instead of decreased.  Perhaps this is related to antibiotic use.  She did get 2.5 mg of vitamin K yesterday after I spoke to the hospitalist  at [facility].  I will recheck her labs today to include INR CBC CMET and a B12 given mild macrocytosis.  Her B12 was 220 back in 2019.  The mildly elevated troponins might be some cardiac strain.  We do not have any suspicion of MI.  I think those are too low.  Defer to hospitalist for management.  She may need additional vitamin K this was explained to the patient.  Family present as well.  I am going to let her eat a heart healthy solid diet.  And I will think there is a clear role to modify her diet at this time we will just need her n.p.o. prior to ERCP when that is appropriate with respect to her anticoagulants.  I appreciate the opportunity to care for this patient. Iva Boop, MD, [facility] 09/11/2020 10:35 AM  [phone]         HPI: [name] is a 86 y.o. female.  PMH COPD on 4 L [place] oxygen at home.  CVA.  Transient global amnesia.  Vitamin D deficiency.  On iron supplement.  Diastolic/systolic heart  failure.  L bundle branch block.  S/p mechanical heart valve on chronic Coumadin.  Carotid artery stenosis.  Palliative care involved with patient since March 2020. No prior colonoscopy or EGD. Presented to [facility] 10/25 with RUQ abdominal pain, radiating to the back and nausea.  Over the last few weeks she has had intermittent upper abdominal pain which was mild.  No bloody or black stools.  Daily to every other day bowel movements, takes a stool softener daily.  T bili 0.9.  Alkaline phosphatase 72.  AST/ALT 82/32.  Repeat LFTs for the next 2 days show resolution of any abnormalities.  Lipase 49. GFR 42.  INR 3.1 Abd ultrasound: Cholelithiasis and sludge.  1.2 cm stone in distal CBD.  CBD dilated.  Fatty liver.   CTA chest/ab/pelvis: Moderate to severe dilation of intra and extrahepatic ducts.  Filling defect at distal CBD.  GB distended and wall is thickened.  Pericholecystic free fluid all suspicious for acute cholecystitis.   Rectosigmoid diverticulosis.  Bilateral fat-containing inguinal hernias, hernia on right contains a nonobstructed loop of small bowel.  2.9 x 3.1 cm infrarenal AAA.  Cardiomegaly, coronary artery calcifications.  Severe narrowing at origin of celiac axis but remaining portions widely patent.  Moderate narrowing at origin of SMA.  Patent IMA.  Due to multiple comorbidities, it was decided to transfer the patient to [facility] hospital for ERCP. since receiving antibiotics at [facility], the patient's abdominal pain has resolved.  She tolerated several cups of ice cream yesterday evening but has for the most part been kept n.p.o. in anticipation of endoscopy procedure.  She denies nausea as well as abdominal pain today.  Patient lives in her own home with a 24/7 caregiver.  She is ambulatory and does a lot of her own ADLs.  She performs physical exercises "slowly". No alcohol.  No NSAIDs.  No prior history or knowledge of having gallstones.    Past Medical History:  Diagnosis Date  . Anemia   . Anxiety   . Arthritis   . Blood transfusion without reported diagnosis   . Carotid artery stenosis    Without infarction  . Cataract   . Chronic combined systolic (congestive) and diastolic (congestive) heart failure (HCC)   . COPD (chronic obstructive pulmonary disease) (HCC)   . Coronary artery disease   . Heart valve replaced by other means   . Hypercholesterolemia    Pure  . Hypertension    Unspecified  . LBBB (left bundle branch block)   . Macular degeneration (senile) of retina, unspecified   . On home O2   . Postsurgical aortocoronary bypass status   . Stroke (HCC)   . Transient global amnesia   . Unspecified hereditary and idiopathic peripheral neuropathy   . Unspecified vitamin D deficiency     Past Surgical History:  Procedure Laterality Date  . AORTIC VALVE REPLACEMENT    . APPENDECTOMY    . CORONARY ARTERY BYPASS GRAFT    . HIP PINNING,CANNULATED Right 07/18/2018   Procedure: RIGHT  CANNULATED HIP PINNING;  Surgeon: Myrene Galas, MD;  Location: [facility] OR;  Service: Orthopedics;  Laterality: Right;  . TONSILLECTOMY      Prior to Admission medications   Medication Sig Start Date End Date Taking? Authorizing Provider  acetaminophen (TYLENOL) 650 MG CR tablet Take 650 mg by mouth every 8 (eight) hours as needed for pain.   Yes [provider]  ALPRAZolam (XANAX) 0.25 MG tablet Take 0.125-0.25 mg by mouth at bedtime  as needed for anxiety.    Yes [provider]  ANORO ELLIPTA 62.5-25 MCG/INH AEPB USE 1 INHALATION ONCE DAILY. 07/22/20  Yes Nyoka Cowden, MD  atorvastatin (LIPITOR) 40 MG tablet TAKE ONE TABLET BY MOUTH ONCE DAILY. 04/08/20  Yes Lewayne Bunting, MD  baclofen (LIORESAL) 10 MG tablet Take 10 mg by mouth as needed for muscle spasms.   Yes [provider]  carvedilol (COREG) 3.125 MG tablet Take 1 tablet (3.125 mg total) by mouth 2 (two) times daily with a meal. Patient taking differently: Take 6.25 mg by mouth 2 (two) times daily with a meal. Pt takes 6.5 mg twice a day. 09/01/20  Yes Lewayne Bunting, MD  docusate sodium (COLACE) 100 MG capsule Take 100 mg by mouth every evening.    Yes [provider]  donepezil (ARICEPT) 10 MG tablet TAKE ONE TABLET BY MOUTH ONCE DAILY. Patient taking differently: Take 10 mg by mouth at bedtime.  04/15/20  Yes Corum, Minerva Fester, MD  ergocalciferol (VITAMIN D2) 50000 UNITS capsule Take 50,000 Units by mouth every Tuesday.    Yes [provider]  ferrous sulfate 325 (65 FE) MG EC tablet Take 325 mg by mouth daily.    Yes [provider]  furosemide (LASIX) 40 MG tablet TAKE ONE TABLET BY MOUTH ONCE DAILY. 04/08/20  Yes Lewayne Bunting, MD  HYDROcodone-acetaminophen (NORCO) 7.5-325 MG tablet Take 1 tablet by mouth every 6 (six) hours as needed for moderate pain.   Yes [provider]  levalbuterol (XOPENEX) 0.63 MG/3ML nebulizer solution USE 1 VIAL VIA NEBULIZER EVERY 8 HOURS AS  NEEDED. 06/23/20  Yes Nyoka Cowden, MD  meclizine (ANTIVERT) 25 MG tablet Take 25 mg by mouth 3 (three) times daily as needed for dizziness.   Yes [provider]  Melatonin 3 MG TABS Take 1 tablet by mouth at bedtime.   Yes [provider]  methylPREDNISolone (MEDROL) 4 MG tablet Take 4 mg by mouth 2 (two) times daily as needed.   Yes [provider]  multivitamin-lutein (OCUVITE-LUTEIN) CAPS capsule Take 1 capsule by mouth daily.   Yes [provider]  ondansetron (ZOFRAN) 4 MG tablet Take 4 mg by mouth every 8 (eight) hours as needed for nausea or vomiting.   Yes [provider]  polyethylene glycol (MIRALAX / GLYCOLAX) 17 g packet Take 17 g by mouth daily as needed for moderate constipation.   Yes [provider]  pregabalin (LYRICA) 75 MG capsule TAKE 1 CAPSULE BY MOUTH EVERY OTHER DAY. 04/29/20  Yes Darreld Mclean, MD  spironolactone (ALDACTONE) 25 MG tablet TAKE (1/2) TABLET BY MOUTH DAILY. Patient taking differently: Take 12.5 mg by mouth daily.  12/24/19  Yes Lewayne Bunting, MD  warfarin (COUMADIN) 5 MG tablet TAKE 1/2 TO 1 TABLET ONCE DAILY AS DIRECTED BY COUMADIN CLINIC. Patient taking differently: Take 5 mg by mouth See admin instructions. Take 1/2 tablet on Monday, Wednesday and Friday and 1 whole tablet on Tues,Thursday, Saturday and Sunday. 03/25/20  Yes Lewayne Bunting, MD  zinc sulfate 50 MG CAPS capsule Take 50 mg by mouth 2 (two) times daily.    Yes [provider]  hydrocortisone (ANUSOL-HC) 2.5 % rectal cream Place 1 application rectally 3 (three) times daily as needed for hemorrhoids or anal itching.    [provider]  traZODone (DESYREL) 50 MG tablet Take 0.5-1 tablets (25-50 mg total) by mouth at bedtime as needed for sleep. Patient not taking: Reported  on 09/08/2020 11/19/19   Wandra Feinstein, MD    Scheduled Meds: . carvedilol  3.125 mg Oral BID WC  . donepezil  10 mg Oral Daily  . phytonadione  2.5  mg Oral Once  . pregabalin  75 mg Oral Daily  . spironolactone  12.5 mg Oral Daily  . umeclidinium-vilanterol  1 puff Inhalation Daily   Infusions: . ciprofloxacin Stopped (09/10/20 1040)   PRN Meds: acetaminophen **OR** acetaminophen, ALPRAZolam, ondansetron **OR** ondansetron (ZOFRAN) IV   Allergies as of 09/08/2020 - Review Complete 09/08/2020  Allergen Reaction Noted  . Codeine Nausea And Vomiting   . Penicillins Other (See Comments)     Family History  Problem Relation Age of Onset  . Heart attack Father   . Heart disease Father   . Stroke Father   . Stroke Sister   . Hypertension Mother   . Stroke Mother   . Neuropathy Brother   . COPD Sister     Social History   Socioeconomic History  . Marital status: Widowed    Spouse name: Not on file  . Number of children: 2  . Years of education: 12th  . Highest education level: Not on file  Occupational History    Employer: RETIRED  Tobacco Use  . Smoking status: Former Smoker    Packs/day: 0.50    Types: Cigarettes    Quit date: 11/30/1991    Years since quitting: 28.8  . Smokeless tobacco: Never Used  . Tobacco comment: Tobacco use-no  Substance and Sexual Activity  . Alcohol use: No  . Drug use: No  . Sexual activity: Never  Other Topics Concern  . Not on file  Social History Narrative   Pt lives at home alone.   Caffeine Use: very rarely   Social Determinants of Health   Financial Resource Strain: Low Risk   . Difficulty of Paying Living Expenses: Not hard at all  Food Insecurity: No Food Insecurity  . Worried About Programme researcher, broadcasting/film/video in the Last Year: Never true  . Ran Out of Food in the Last Year: Never true  Transportation Needs: No Transportation Needs  . Lack of Transportation (Medical): No  . Lack of Transportation (Non-Medical): No  Physical Activity: Insufficiently Active  . Days of Exercise per Week: 3 days  . Minutes of Exercise per Session: 10 min  Stress: No Stress Concern Present  .  Feeling of Stress : Only a little  Social Connections: Moderately Integrated  . Frequency of Communication with Friends and Family: More than three times a week  . Frequency of Social Gatherings with Friends and Family: Three times a week  . Attends Religious Services: 1 to 4 times per year  . Active Member of Clubs or Organizations: Yes  . Attends Banker Meetings: 1 to 4 times per year  . Marital Status: Widowed  Intimate Partner Violence: Not At Risk  . Fear of Current or Ex-Partner: No  . Emotionally Abused: No  . Physically Abused: No  . Sexually Abused: No    REVIEW OF SYSTEMS: Constitutional: No weakness or fatigue ENT:  No nose bleeds Pulm: No cough, no shortness of breath CV:  No palpitations, no LE edema.  No angina GU:  No hematuria, no frequency.  No dark-colored urine GI: No dysphagia.  See HPI. Heme: No excessive or unusual bleeding or bruising. Transfusions: None Neuro:  No headaches, no peripheral tingling or numbness.  No seizures, no syncope. Derm:  No itching,  no rash or sores.  No jaundice Endocrine:  No sweats or chills.  No polyuria or dysuria Immunization: Has been vaccinated for COVID-19. Travel:  None beyond local counties in last few months.    PHYSICAL EXAM: Vital signs in last 24 hours: Vitals:   09/11/20 0843 09/11/20 0846  BP: 116/78 116/78  Pulse: 75 71  Resp:  18  Temp:  (!) 97.5 F (36.4 C)  SpO2: 97% 93%   Wt Readings from Last 3 Encounters:  09/08/20 50.3 kg  08/20/20 50.3 kg  06/23/20 52.5 kg    General: Pleasant, petite aged lady.  Not ill-appearing and looks healthier in person than record would suggest. Head: No facial asymmetry or swelling.  No head trauma Eyes: No scleral icterus, no conjunctival pallor. Ears: No obvious hearing deficit Nose: No congestion or discharge Mouth: Oral mucosa is moist, pink, clear.  Dentition fair.  Tongue midline Neck: No JVD, no masses, no thyromegaly. Lungs: A few fine rales in  the right base otherwise clear.  No labored breathing or cough Heart: RRR.  No MRG.  S1, S2 present Abdomen: Not tender, not distended.  Soft.  Active bowel sounds.  No HSM, masses, bruits.   Rectal: Deferred Musc/Skeltl: Spinal kyphosis.  Sits up from a reclining position on the bed to a seated position without assistance Extremities: No CCE Neurologic: Fully alert and oriented.  Moves all 4 limbs, strength not tested.  No tremor Skin: No jaundice.  No telangiectasia, no rash or sores.   Psych: Calm, cooperative, pleasant affect.  Fluid speech.   LAB RESULTS: Recent Labs    09/08/20 1300 09/09/20 0545 09/10/20 0324  WBC 8.5 6.2 6.7  HGB 10.5* 9.3* 8.8*  HCT 32.8* 29.8* 28.4*  PLT 175 158 160   BMET Lab Results  Component Value Date   NA 141 09/10/2020   NA 142 09/09/2020   NA 142 09/08/2020   K 3.9 09/10/2020   K 3.3 (L) 09/09/2020   K 3.8 09/08/2020   CL 106 09/10/2020   CL 100 09/09/2020   CL 100 09/08/2020   CO2 31 09/10/2020   CO2 31 09/09/2020   CO2 31 09/08/2020   GLUCOSE 75 09/10/2020   GLUCOSE 84 09/09/2020   GLUCOSE 112 (H) 09/08/2020   BUN 15 09/10/2020   BUN 21 09/09/2020   BUN 24 (H) 09/08/2020   CREATININE 1.17 (H) 09/10/2020   CREATININE 1.21 (H) 09/09/2020   CREATININE 1.21 (H) 09/08/2020   CALCIUM 8.6 (L) 09/10/2020   CALCIUM 8.9 09/09/2020   CALCIUM 9.4 09/08/2020   LFT Recent Labs    09/08/20 1300 09/09/20 0545 09/10/20 0324  PROT 7.1 6.1* 5.8*  ALBUMIN 4.1 3.5 3.2*  AST 82* 36 27  ALT 32 23 17  ALKPHOS 72 62 56  BILITOT 0.9 0.8 0.8   PT/INR Lab Results  Component Value Date   INR 3.1 (H) 09/10/2020   INR 2.6 (H) 09/09/2020   INR 2.5 (H) 09/08/2020   PROTIME 20.5 05/01/2009   Lipase     Component Value Date/Time   LIPASE 49 09/08/2020 1300     RADIOLOGY STUDIES: ECHOCARDIOGRAM COMPLETE  Result Date: 09/09/2020 IMPRESSIONS  1. Left ventricular ejection fraction, by estimation, is 55 to 60%. The left ventricle has  normal function. The left ventricle has no regional wall motion abnormalities. There is moderate left ventricular hypertrophy. Left ventricular diastolic parameters are consistent with Grade I diastolic dysfunction (impaired relaxation). Elevated left atrial pressure.  2. Right ventricular systolic  function is normal. The right ventricular size is normal.  3. Left atrial size was severely dilated.  4. Right atrial size was mildly dilated.  5. The mitral valve is abnormal. Mild to moderate mitral valve regurgitation. No evidence of mitral stenosis. Moderate mitral annular calcification.  6. History of AV replacement, unknown type. . The aortic valve has been repaired/replaced. There is moderate calcification of the aortic valve. There is moderate thickening of the aortic valve. Aortic valve regurgitation is moderate. No aortic stenosis is present.  7. The inferior vena cava is normal in size with greater than 50% respiratory variability, suggesting right atrial pressure of 3 mmHg. Electronically signed by Dina Rich MD Signature Date/Time: 09/09/2020/4:14:42 PM    Final

## 2020-09-12 DIAGNOSIS — I10 Essential (primary) hypertension: Secondary | ICD-10-CM | POA: Diagnosis not present

## 2020-09-12 DIAGNOSIS — K8050 Calculus of bile duct without cholangitis or cholecystitis without obstruction: Secondary | ICD-10-CM | POA: Diagnosis not present

## 2020-09-12 DIAGNOSIS — Z952 Presence of prosthetic heart valve: Secondary | ICD-10-CM | POA: Diagnosis not present

## 2020-09-12 LAB — COMPREHENSIVE METABOLIC PANEL
ALT: 15 U/L (ref 0–44)
AST: 22 U/L (ref 15–41)
Albumin: 2.9 g/dL — ABNORMAL LOW (ref 3.5–5.0)
Alkaline Phosphatase: 53 U/L (ref 38–126)
Anion gap: 6 (ref 5–15)
BUN: 15 mg/dL (ref 8–23)
CO2: 29 mmol/L (ref 22–32)
Calcium: 8.6 mg/dL — ABNORMAL LOW (ref 8.9–10.3)
Chloride: 107 mmol/L (ref 98–111)
Creatinine, Ser: 1.46 mg/dL — ABNORMAL HIGH (ref 0.44–1.00)
GFR, Estimated: 34 mL/min — ABNORMAL LOW (ref >60)
Glucose, Bld: 113 mg/dL — ABNORMAL HIGH (ref 70–99)
Potassium: 3.9 mmol/L (ref 3.5–5.1)
Sodium: 142 mmol/L (ref 135–145)
Total Bilirubin: 0.7 mg/dL (ref 0.3–1.2)
Total Protein: 5.4 g/dL — ABNORMAL LOW (ref 6.5–8.1)

## 2020-09-12 LAB — CBC
HCT: 27.7 % — ABNORMAL LOW (ref 36.0–46.0)
Hemoglobin: 8.7 g/dL — ABNORMAL LOW (ref 12.0–15.0)
MCH: 31.1 pg (ref 26.0–34.0)
MCHC: 31.4 g/dL (ref 30.0–36.0)
MCV: 98.9 fL (ref 80.0–100.0)
Platelets: 150 10*3/uL (ref 150–400)
RBC: 2.8 MIL/uL — ABNORMAL LOW (ref 3.87–5.11)
RDW: 13.2 % (ref 11.5–15.5)
WBC: 7.7 10*3/uL (ref 4.0–10.5)
nRBC: 0 % (ref 0.0–0.2)

## 2020-09-12 LAB — PROTIME-INR
INR: 2.2 — ABNORMAL HIGH (ref 0.8–1.2)
INR: 1.7 — ABNORMAL HIGH (ref 0.8–1.2)
INR: 1.6 — ABNORMAL HIGH (ref 0.8–1.2)
Prothrombin Time: 23.9 seconds — ABNORMAL HIGH (ref 11.4–15.2)
Prothrombin Time: 19.3 seconds — ABNORMAL HIGH (ref 11.4–15.2)
Prothrombin Time: 18.2 seconds — ABNORMAL HIGH (ref 11.4–15.2)

## 2020-09-12 LAB — HEPARIN LEVEL (UNFRACTIONATED): Heparin Unfractionated: 0.27 IU/mL — ABNORMAL LOW (ref 0.30–0.70)

## 2020-09-12 MED ORDER — SODIUM CHLORIDE 0.9 % IV SOLN
1.0000 g | INTRAVENOUS | Status: DC
  Administered 2020-09-12 – 2020-09-16 (×5): 1 g via INTRAVENOUS
  Filled 2020-09-12 (×5): qty 10

## 2020-09-12 MED ORDER — HEPARIN (PORCINE) 25000 UT/250ML-% IV SOLN
800.0000 [IU]/h | INTRAVENOUS | Status: DC
  Administered 2020-09-12: 700 [IU]/h via INTRAVENOUS
  Filled 2020-09-12: qty 250

## 2020-09-12 MED ORDER — METRONIDAZOLE IN NACL 5-0.79 MG/ML-% IV SOLN: 500.0000 mg | Freq: Three times a day (TID) | INTRAVENOUS | Status: DC

## 2020-09-12 MED ORDER — LACTATED RINGERS IV SOLN: INTRAVENOUS | Status: DC

## 2020-09-12 NOTE — H&P (View-Only) (Signed)
   Patient Name: [name] Date of Encounter: 09/12/2020, 10:13 AM    Subjective  She feels okay just hopeful to get ERCP with bile duct stone extraction soon.   Objective  BP (!) 147/60 (BP Location: Right Arm)   Pulse (!) 55   Temp 97.8 F (36.6 C) (Oral)   Resp 20   Ht 5\' 2"  (1.575 m)   Wt 50.3 kg   SpO2 100%   BMI 20.30 kg/m  Lab Results  Component Value Date   INR 2.2 (H) 09/12/2020   INR 2.8 (H) 09/11/2020   INR 3.1 (H) 09/10/2020   PROTIME 20.5 05/01/2009   Lab Results  Component Value Date   WBC 6.7 09/11/2020   HGB 9.1 (L) 09/11/2020   HCT 28.8 (L) 09/11/2020   MCV 98.6 09/11/2020   PLT 158 09/11/2020   Lab Results  Component Value Date   CREATININE 1.46 (H) 09/12/2020   BUN 15 09/12/2020   NA 142 09/12/2020   K 3.9 09/12/2020   CL 107 09/12/2020   CO2 29 09/12/2020       Assessment and Plan  Choledocholithiasis normal LFTs in a patient on chronic O2 with COPD and CHF CAD and mechanical aortic valve.  Currently on heparin infusion as INR is coming down.  INR not yet in range for ERCP.  Less than 2 for sure and ideally 1.7 or lower.  I am going to recheck her INR this afternoon and consider some additional vitamin K in anticipation and being hopeful to get ERCP with stone extraction done tomorrow.  My colleague Dr. 09/14/2020 will perform the procedure.  I explained risks benefits and indications to the patient and her daughter.  She may eat solid food today I have ordered the diet will make her n.p.o. after midnight and I have sent a care order instruction to nurses through orders to turn off heparin at 4 in the morning.  [name], MD, [facility] 09/12/2020 10:13 AM

## 2020-09-12 NOTE — Progress Notes (Signed)
   Patient Name: [name] Date of Encounter: 09/12/2020, 10:13 AM    Subjective  She feels okay just hopeful to get ERCP with bile duct stone extraction soon.   Objective  BP (!) 147/60 (BP Location: Right Arm)   Pulse (!) 55   Temp 97.8 F (36.6 C) (Oral)   Resp 20   Ht 5' 2" (1.575 m)   Wt 50.3 kg   SpO2 100%   BMI 20.30 kg/m  Lab Results  Component Value Date   INR 2.2 (H) 09/12/2020   INR 2.8 (H) 09/11/2020   INR 3.1 (H) 09/10/2020   PROTIME 20.5 05/01/2009   Lab Results  Component Value Date   WBC 6.7 09/11/2020   HGB 9.1 (L) 09/11/2020   HCT 28.8 (L) 09/11/2020   MCV 98.6 09/11/2020   PLT 158 09/11/2020   Lab Results  Component Value Date   CREATININE 1.46 (H) 09/12/2020   BUN 15 09/12/2020   NA 142 09/12/2020   K 3.9 09/12/2020   CL 107 09/12/2020   CO2 29 09/12/2020       Assessment and Plan  Choledocholithiasis normal LFTs in a patient on chronic O2 with COPD and CHF CAD and mechanical aortic valve.  Currently on heparin infusion as INR is coming down.  INR not yet in range for ERCP.  Less than 2 for sure and ideally 1.7 or lower.  I am going to recheck [name] INR this afternoon and consider some additional vitamin K in anticipation and being hopeful to get ERCP with stone extraction done tomorrow.  My colleague Dr. Jacobs will perform the procedure.  I explained risks benefits and indications to the patient and [name] daughter.  She may eat solid food today I have ordered the diet will make [name] n.p.o. after midnight and I have sent a care order instruction to nurses through orders to turn off heparin at 4 in the morning.  Mckay Brandt E. Serria Sloma, MD, FACG [facility] 09/12/2020 10:13 AM   

## 2020-09-12 NOTE — Consult Note (Signed)
   [facility] Inpatient Consult   09/12/2020  BRIA SPARR [birth year] [id number]   [facility] [[facility]]  Accountable Care Organization [ACO] Patient: Medicare NextGen   Patient screened for high risk score for unplanned readmission score and for hospitalizations to check if potential [facility] manager  [[facility]] Care Management service needs.  Review of patient's medical record reveals patient has been active with [facility] program. Patient with an outreach by [facility] in the past.  Primary Care Provider is  Oval Linsey, MD   Plan: No changes in community plan noted.  For questions contact:   Charlesetta Shanks, RN BSN CCM [facility]  [phone] business mobile phone Toll free office [phone]  Fax number: [phone] [email or web address]

## 2020-09-12 NOTE — Progress Notes (Signed)
[facility]  PROGRESS NOTE  RHILEY TARVER KZS:[id number] DOB: 21-Nov-1925 DOA: 09/08/2020 PCP: Oval Linsey, MD   Brief HPI:   86 year old female with history of vitamin D deficiency, transient global amnesia, stroke, COPD on 4 L nasal cannula at home, left bundle branch block, hypertension, hyperlipidemia, CAD, chronic combined systolic and diastolic CHF, carotid artery stenosis, mechanical heart valve.  Patient presents with abdominal pain with concern for acute cholecystitis and choledocholithiasis.  She was seen by general surgery and gastroenterology.  No plans for cholecystectomy, GI recommended ERCP.  Patient was transferred to [facility] as anesthesiologist did not feel comfortable patient to have procedure at [facility] hospital.    Subjective   Patient seen and examined, denies any complaints.  INR is down to 2.2 as of this morning.   Assessment/Plan:     1. Choledocholithiasis-patient seen by general surgery and gastroenterology.  She does not have cholecystitis clinically.  Patient started on ciprofloxacin.  GI has seen the patient and plan for ERCP once INR is normalized. 2. Elevated PT/INR-patient is on Coumadin for mechanical aortic valve.  She received vitamin K 2.5 mg IV x1 yesterday for elevated INR of 3.1.  PT/INR this morning is 2.2. 3. Mechanical aortic valve-seen on transthoracic echo from 2018, patient is on Coumadin as outpatient.  Goal of INR 2.2-3.0 as per outpatient Coumadin clinic note.  Start heparin infusion for INR less than 2.2.  She received vitamin K as above.  Check PT/INR today. 4.  Anemia-continue iron supplementation 5. Prolonged QTC-QTC was prolonged at the time of admission to 528.  Will repeat EKG.  Serum magnesium was checked which was 2.2.   6. Hyperlipidemia-continue Lipitor 7. Acute kidney injury on CKD stage IIIb-creatinine is rising, patient baseline creatinine around 1.2.  Today creatinine is up to 1.46.  Will start LR at 75 mL/h.  Check  BMP in a.m. 8. Dementia-continue Aricept 9. Anxiety-continue Xanax as needed     COVID-19 Labs  No results for input(s): DDIMER, FERRITIN, LDH, CRP in the last 72 hours.  Lab Results  Component Value Date   SARSCOV2NAA NEGATIVE 09/08/2020   SARSCOV2NAA NEGATIVE 02/05/2020   SARSCOV2NAA DETECTED (A) 10/20/2019     Scheduled medications:   . donepezil  10 mg Oral Daily  . pregabalin  75 mg Oral Daily  . spironolactone  12.5 mg Oral Daily  . umeclidinium-vilanterol  1 puff Inhalation Daily    SpO2: 100 % O2 Flow Rate (L/min): 3 L/min FiO2 (%): 32 %    CBC: Recent Labs  Lab 09/08/20 1300 09/09/20 0545 09/10/20 0324 09/11/20 1123  WBC 8.5 6.2 6.7 6.7  NEUTROABS 6.4 3.8  --   --   HGB 10.5* 9.3* 8.8* 9.1*  HCT 32.8* 29.8* 28.4* 28.8*  MCV 97.6 98.3 101.1* 98.6  PLT 175 158 160 158    Basic Metabolic Panel: Recent Labs  Lab 09/08/20 1300 09/09/20 0545 09/10/20 0324 09/11/20 1123 09/12/20 0402  NA 142 142 141 142 142  K 3.8 3.3* 3.9 3.9 3.9  CL 100 100 106 103 107  CO2 31 31 31 29 29   GLUCOSE 112* 84 75 106* 113*  BUN 24* 21 15 10 15   CREATININE 1.21* 1.21* 1.17* 1.32* 1.46*  CALCIUM 9.4 8.9 8.6* 8.8* 8.6*  MG  --  2.1 2.2  --   --      Liver Function Tests: Recent Labs  Lab 09/08/20 1300 09/09/20 0545 09/10/20 0324 09/11/20 1123 09/12/20 0402  AST 82* 36 27  27 22  ALT 32 23 17 18 15   ALKPHOS 72 62 56 57 53  BILITOT 0.9 0.8 0.8 0.8 0.7  PROT 7.1 6.1* 5.8* 5.8* 5.4*  ALBUMIN 4.1 3.5 3.2* 3.2* 2.9*     Antibiotics: Anti-infectives (From admission, onward)   Start     Dose/Rate Route Frequency Ordered Stop   09/12/20 0930  cefTRIAXone (ROCEPHIN) 1 g in sodium chloride 0.9 % 100 mL IVPB        1 g 200 mL/hr over 30 Minutes Intravenous Every 24 hours 09/12/20 0838     09/12/20 0930  metroNIDAZOLE (FLAGYL) IVPB 500 mg  Status:  Discontinued        500 mg 100 mL/hr over 60 Minutes Intravenous Every 8 hours 09/12/20 0838 09/12/20 0847    09/10/20 0900  ciprofloxacin (CIPRO) IVPB 400 mg  Status:  Discontinued        400 mg 200 mL/hr over 60 Minutes Intravenous Every 24 hours 09/09/20 1041 09/12/20 0838   09/09/20 0800  ciprofloxacin (CIPRO) IVPB 400 mg  Status:  Discontinued        400 mg 200 mL/hr over 60 Minutes Intravenous Every 12 hours 09/08/20 1908 09/09/20 1041   09/08/20 1800  ciprofloxacin (CIPRO) IVPB 400 mg        400 mg 200 mL/hr over 60 Minutes Intravenous  Once 09/08/20 1745 09/08/20 2000       DVT prophylaxis: Coumadin  Code Status: Full code  Family Communication: Discussed with patient's daughter at bedside  Consultants:  Gastroenterology    Objective   Vitals:   09/11/20 1948 09/12/20 0429 09/12/20 0837 09/12/20 0841  BP: (!) 107/41 (!) 143/50  (!) 147/60  Pulse: 60 (!) 51  (!) 55  Resp: 17 17  20   Temp: 97.8 F (36.6 C) 98.3 F (36.8 C)  97.8 F (36.6 C)  TempSrc: Oral Oral  Oral  SpO2: 95% 99% 97% 100%  Weight:      Height:        Intake/Output Summary (Last 24 hours) at 09/12/2020 0853 Last data filed at 09/11/2020 1850 Gross per 24 hour  Intake 480 ml  Output 75 ml  Net 405 ml    10/27 1901 - 10/29 0700 In: 600 [P.O.:600] Out: 75 [Urine:75]  Filed Weights   09/08/20 1227  Weight: 50.3 kg    Physical Examination:   General-appears in no acute distress Heart-S1-S2, regular, no murmur auscultated Lungs-clear to auscultation bilaterally, no wheezing or crackles auscultated Abdomen-soft, nontender, no organomegaly Extremities-no edema in the lower extremities Neuro-alert, oriented x3, no focal deficit noted  Status is: Inpatient  Dispo: The patient is from: Home              Anticipated d/c is to: Home              Anticipated d/c date is: 09/15/2020              Patient currently not medically stable for discharge  Barrier to discharge-awaiting ERCP for choledocholithiasis      Data Reviewed:   Recent Results (from the past 240 hour(s))  Respiratory  Panel by RT PCR (Flu A&B, Covid) - Nasopharyngeal Swab     Status: None   Collection Time: 09/08/20  8:22 PM   Specimen: Nasopharyngeal Swab  Result Value Ref Range Status   SARS Coronavirus 2 by RT PCR NEGATIVE NEGATIVE Final    Comment: (NOTE) SARS-CoV-2 target nucleic acids are NOT DETECTED.  The SARS-CoV-2 RNA is  generally detectable in upper respiratoy specimens during the acute phase of infection. The lowest concentration of SARS-CoV-2 viral copies this assay can detect is 131 copies/mL. A negative result does not preclude SARS-Cov-2 infection and should not be used as the sole basis for treatment or other patient management decisions. A negative result may occur with  improper specimen collection/handling, submission of specimen other than nasopharyngeal swab, presence of viral mutation(s) within the areas targeted by this assay, and inadequate number of viral copies (<131 copies/mL). A negative result must be combined with clinical observations, patient history, and epidemiological information. The expected result is Negative.  Fact Sheet for Patients:  https://www.moore.com/  Fact Sheet for Healthcare Providers:  https://www.young.biz/  This test is no t yet approved or cleared by the [place] FDA and  has been authorized for detection and/or diagnosis of SARS-CoV-2 by FDA under an Emergency Use Authorization (EUA). This EUA will remain  in effect (meaning this test can be used) for the duration of the COVID-19 declaration under Section 564(b)(1) of the Act, 21 U.S.C. section 360bbb-3(b)(1), unless the authorization is terminated or revoked sooner.     Influenza A by PCR NEGATIVE NEGATIVE Final   Influenza B by PCR NEGATIVE NEGATIVE Final    Comment: (NOTE) The Xpert Xpress SARS-CoV-2/FLU/RSV assay is intended as an aid in  the diagnosis of influenza from Nasopharyngeal swab specimens and  should not be used as a sole basis  for treatment. Nasal washings and  aspirates are unacceptable for Xpert Xpress SARS-CoV-2/FLU/RSV  testing.  Fact Sheet for Patients: https://www.moore.com/  Fact Sheet for Healthcare Providers: https://www.young.biz/  This test is not yet approved or cleared by the [place] FDA and  has been authorized for detection and/or diagnosis of SARS-CoV-2 by  FDA under an Emergency Use Authorization (EUA). This EUA will remain  in effect (meaning this test can be used) for the duration of the  Covid-19 declaration under Section 564(b)(1) of the Act, 21  U.S.C. section 360bbb-3(b)(1), unless the authorization is  terminated or revoked. Performed at [facility], [address] [place], [place] [address]     Recent Labs  Lab 09/08/20 1300  LIPASE 49   No results for input(s): AMMONIA in the last 168 hours.  Cardiac Enzymes: No results for input(s): CKTOTAL, CKMB, CKMBINDEX, TROPONINI in the last 168 hours. BNP (last 3 results) Recent Labs    02/05/20 0727 02/07/20 1234 02/09/20 0632  BNP 1,712.0* 1,331.0* 386.0*    Kimyatta Lecy S Tiajuana Leppanen   [facility] If 7PM-7AM, please contact night-coverage at [email or web address], Office  [phone]   09/12/2020, 8:53 AM  LOS: 4 days

## 2020-09-12 NOTE — Progress Notes (Signed)
ANTICOAGULATION CONSULT NOTE  Pharmacy Consult:  Heparin Indication: Mechanical AVR   Allergies  Allergen Reactions  . Codeine Nausea And Vomiting  . Penicillins Other (See Comments)    Unknown reaction - listed on MAR Has patient had a PCN reaction causing immediate rash, facial/tongue/throat swelling, SOB or lightheadedness with hypotension: Unknown Has patient had a PCN reaction causing severe rash involving mucus membranes or skin necrosis: Unknown Has patient had a PCN reaction that required hospitalization: Unknown Has patient had a PCN reaction occurring within the last 10 years: Unknown If all of the above answers are "NO", then may proceed with Cephalosporin use.    Patient Measurements: Height: 5\' 2"  (157.5 cm) Weight: 50.3 kg (111 lb) IBW/kg (Calculated) : 50.1 Heparin Dosing Weight: 50 kg  Vital Signs: Temp: 98.3 F (36.8 C) (10/29 0429) Temp Source: Oral (10/29 0429) BP: 143/50 (10/29 0429) Pulse Rate: 51 (10/29 0429)  Labs: Recent Labs    09/10/20 0324 09/11/20 1123 09/12/20 0402  HGB 8.8* 9.1*  --   HCT 28.4* 28.8*  --   PLT 160 158  --   LABPROT 30.7* 28.7* 23.9*  INR 3.1* 2.8* 2.2*  CREATININE 1.17* 1.32* 1.46*    Estimated Creatinine Clearance: 19.9 mL/min (A) (by C-G formula based on SCr of 1.46 mg/dL (H)).   Assessment: [name] presented with abdominal pain, found to have choledocholithiasis requiring surgery.  Patient has a history of mechanical AVR on Coumadin PTA and Pharmacy consulted to transition to IV heparin once INR falls below 2.2 (goal 2.2-3 per [facility] clinic note).    INR right at 2.2 this AM.  Will not repeat INR and start IV heparin later this AM.  No bleeding reported.  Goal of Therapy:  Heparin level 0.3-0.7 units/mL Monitor platelets by anticoagulation protocol: Yes   Plan:  At 12N, start heparin gtt at 700 units/hr, no bolus Check 8 hr heparin level Daily heparin level, PT / INR and CBC  Avis Mcmahill D. [facility], PharmD, BCPS,  BCCCP 09/12/2020, 8:33 AM

## 2020-09-13 ENCOUNTER — Inpatient Hospital Stay (HOSPITAL_COMMUNITY): Payer: Medicare Other | Admitting: Anesthesiology

## 2020-09-13 ENCOUNTER — Inpatient Hospital Stay (HOSPITAL_COMMUNITY): Payer: Medicare Other

## 2020-09-13 ENCOUNTER — Encounter (HOSPITAL_COMMUNITY)
Admission: EM | Disposition: A | Discharge status: Home Health | Payer: Self-pay | Source: Home / Self Care | Attending: Family Medicine

## 2020-09-13 ENCOUNTER — Encounter (HOSPITAL_COMMUNITY): Payer: Self-pay | Admitting: Family Medicine

## 2020-09-13 DIAGNOSIS — Z952 Presence of prosthetic heart valve: Secondary | ICD-10-CM | POA: Diagnosis not present

## 2020-09-13 DIAGNOSIS — I10 Essential (primary) hypertension: Secondary | ICD-10-CM | POA: Diagnosis not present

## 2020-09-13 DIAGNOSIS — K8050 Calculus of bile duct without cholangitis or cholecystitis without obstruction: Secondary | ICD-10-CM | POA: Diagnosis not present

## 2020-09-13 HISTORY — PX: SPHINCTEROTOMY: SHX5544

## 2020-09-13 HISTORY — PX: ERCP: SHX5425

## 2020-09-13 HISTORY — PX: REMOVAL OF STONES: SHX5545

## 2020-09-13 LAB — BASIC METABOLIC PANEL
Anion gap: 10 (ref 5–15)
BUN: 11 mg/dL (ref 8–23)
CO2: 26 mmol/L (ref 22–32)
Calcium: 8.8 mg/dL — ABNORMAL LOW (ref 8.9–10.3)
Chloride: 107 mmol/L (ref 98–111)
Creatinine, Ser: 1.15 mg/dL — ABNORMAL HIGH (ref 0.44–1.00)
GFR, Estimated: 45 mL/min — ABNORMAL LOW (ref >60)
Glucose, Bld: 114 mg/dL — ABNORMAL HIGH (ref 70–99)
Potassium: 4.3 mmol/L (ref 3.5–5.1)
Sodium: 143 mmol/L (ref 135–145)

## 2020-09-13 SURGERY — ERCP, WITH INTERVENTION IF INDICATED
Anesthesia: General

## 2020-09-13 MED ORDER — DEXAMETHASONE SODIUM PHOSPHATE 10 MG/ML IJ SOLN
INTRAMUSCULAR | Status: DC | PRN
  Administered 2020-09-13: 5 mg via INTRAVENOUS

## 2020-09-13 MED ORDER — IPRATROPIUM-ALBUTEROL 0.5-2.5 (3) MG/3ML IN SOLN
RESPIRATORY_TRACT | Status: AC
  Filled 2020-09-13: qty 3

## 2020-09-13 MED ORDER — LACTATED RINGERS IV SOLN: INTRAVENOUS | Status: DC

## 2020-09-13 MED ORDER — SUGAMMADEX SODIUM 200 MG/2ML IV SOLN
INTRAVENOUS | Status: DC | PRN
  Administered 2020-09-13: 200 mg via INTRAVENOUS

## 2020-09-13 MED ORDER — LIDOCAINE 2% (20 MG/ML) 5 ML SYRINGE
INTRAMUSCULAR | Status: DC | PRN
  Administered 2020-09-13: 50 mg via INTRAVENOUS

## 2020-09-13 MED ORDER — ONDANSETRON HCL 4 MG/2ML IJ SOLN
INTRAMUSCULAR | Status: DC | PRN
  Administered 2020-09-13: 4 mg via INTRAVENOUS

## 2020-09-13 MED ORDER — FENTANYL CITRATE (PF) 100 MCG/2ML IJ SOLN
INTRAMUSCULAR | Status: DC | PRN
Start: 2020-09-13 — End: 2020-09-13
  Administered 2020-09-13: 50 ug via INTRAVENOUS

## 2020-09-13 MED ORDER — HEPARIN (PORCINE) 25000 UT/250ML-% IV SOLN
1000.0000 [IU]/h | INTRAVENOUS | Status: DC
  Administered 2020-09-14 – 2020-09-15 (×2): 800 [IU]/h via INTRAVENOUS
  Administered 2020-09-17: 950 [IU]/h via INTRAVENOUS
  Administered 2020-09-18 – 2020-09-19 (×2): 1000 [IU]/h via INTRAVENOUS
  Filled 2020-09-13 (×5): qty 250

## 2020-09-13 MED ORDER — ALPRAZOLAM 0.25 MG PO TABS
0.1250 mg | ORAL_TABLET | Freq: Three times a day (TID) | ORAL | Status: DC | PRN
  Administered 2020-09-13 – 2020-09-17 (×3): 0.25 mg via ORAL
  Filled 2020-09-13 (×3): qty 1

## 2020-09-13 MED ORDER — IPRATROPIUM-ALBUTEROL 0.5-2.5 (3) MG/3ML IN SOLN
3.0000 mL | Freq: Four times a day (QID) | RESPIRATORY_TRACT | Status: DC | PRN
  Administered 2020-09-13 – 2020-09-19 (×4): 3 mL via RESPIRATORY_TRACT
  Filled 2020-09-13 (×3): qty 3

## 2020-09-13 MED ORDER — SODIUM CHLORIDE 0.9 % IV SOLN
INTRAVENOUS | Status: DC | PRN
  Administered 2020-09-13: 20 mL

## 2020-09-13 MED ORDER — PROPOFOL 10 MG/ML IV BOLUS
INTRAVENOUS | Status: DC | PRN
  Administered 2020-09-13: 100 mg via INTRAVENOUS

## 2020-09-13 MED ORDER — PHENYLEPHRINE HCL-NACL 10-0.9 MG/250ML-% IV SOLN
INTRAVENOUS | Status: DC | PRN
  Administered 2020-09-13: 25 ug/min via INTRAVENOUS

## 2020-09-13 MED ORDER — ROCURONIUM BROMIDE 10 MG/ML (PF) SYRINGE
PREFILLED_SYRINGE | INTRAVENOUS | Status: DC | PRN
  Administered 2020-09-13: 30 mg via INTRAVENOUS

## 2020-09-13 MED ORDER — INDOMETHACIN 50 MG RE SUPP
RECTAL | Status: AC
  Filled 2020-09-13: qty 2

## 2020-09-13 MED ORDER — INDOMETHACIN 50 MG RE SUPP
RECTAL | Status: DC | PRN
  Administered 2020-09-13: 100 mg via RECTAL

## 2020-09-13 NOTE — Plan of Care (Signed)

## 2020-09-13 NOTE — Anesthesia Preprocedure Evaluation (Addendum)
Anesthesia Evaluation  Patient identified by MRN, date of birth, ID bandGeneral Assessment Comment:Patient awake  Reviewed: Allergy & Precautions, NPO status , Patient's Chart, lab work & pertinent test results  Airway Mallampati: II  TM Distance: >3 FB Neck ROM: Full    Dental no notable dental hx.    Pulmonary COPD,  COPD inhaler and oxygen dependent, former smoker,    Pulmonary exam normal breath sounds clear to auscultation       Cardiovascular hypertension, Pt. on home beta blockers + CAD, + CABG and +CHF  Normal cardiovascular exam Rhythm:Regular Rate:Normal  ECG: Normal sinus rhythm Left bundle branch block  ECHO: 1. Left ventricular ejection fraction, by estimation, is 55 to 60%. The left ventricle has normal function. The left ventricle has no regional wall motion abnormalities. There is moderate left ventricular hypertrophy. Left ventricular diastolic parameters are consistent with Grade I diastolic dysfunction (impaired relaxation). Elevated left atrial pressure. 2. Right ventricular systolic function is normal. The right ventricular size is normal. 3. Left atrial size was severely dilated. 4. Right atrial size was mildly dilated. 5. The mitral valve is abnormal. Mild to moderate mitral valve regurgitation. No evidence of mitral stenosis. Moderate mitral annular calcification. 6. History of AV replacement, unknown type. . The aortic valve has been repaired/replaced. There is moderate calcification of the aortic valve. There is moderate thickening of the aortic valve. Aortic valve regurgitation is moderate. No aortic stenosis is present. 7. The inferior vena cava is normal in size with greater than 50% respiratory variability, suggesting right atrial pressure of 3 mmHg.   Neuro/Psych PSYCHIATRIC DISORDERS Anxiety Dementia peripheral neuropathy CVA    GI/Hepatic negative GI ROS, Neg liver ROS,   Endo/Other  negative  endocrine ROS  Renal/GU negative Renal ROS     Musculoskeletal  (+) Arthritis ,   Abdominal   Peds  Hematology  (+) anemia , HLD   Anesthesia Other Findings bile duct stone  Reproductive/Obstetrics                            Anesthesia Physical Anesthesia Plan  ASA: IV  Anesthesia Plan: General   Post-op Pain Management:    Induction: Intravenous  PONV Risk Score and Plan: 3 and Ondansetron, Dexamethasone and Treatment may vary due to age or medical condition  Airway Management Planned: Oral ETT  Additional Equipment:   Intra-op Plan:   Post-operative Plan: Extubation in OR  Informed Consent: I have reviewed the patients History and Physical, chart, labs and discussed the procedure including the risks, benefits and alternatives for the proposed anesthesia with the patient or authorized representative who has indicated his/her understanding and acceptance.   Patient has DNR.   Consent reviewed with POA  Plan Discussed with: CRNA  Anesthesia Plan Comments:        Anesthesia Quick Evaluation

## 2020-09-13 NOTE — Progress Notes (Signed)
[facility]  PROGRESS NOTE  [name] EXB:[id number] DOB: January 23, 1926 DOA: 09/08/2020 PCP: Oval Linsey, MD   Brief HPI:   86 year old female with history of vitamin D deficiency, transient global amnesia, stroke, COPD on 4 L nasal cannula at home, left bundle branch block, hypertension, hyperlipidemia, CAD, chronic combined systolic and diastolic CHF, carotid artery stenosis, mechanical heart valve.  Patient presents with abdominal pain with concern for acute cholecystitis and choledocholithiasis.  She was seen by general surgery and gastroenterology.  No plans for cholecystectomy, GI recommended ERCP.  Patient was transferred to [facility] as anesthesiologist did not feel comfortable patient to have procedure at [facility] hospital.    Subjective   Patient seen and examined, denies any complaints.  INR is down to 2.2 as of this morning.   Assessment/Plan:     1. Choledocholithiasis-patient seen by general surgery and gastroenterology.  She does not have cholecystitis clinically.  Patient started on ciprofloxacin.  INR is 1.6 this morning, plan for ERCP for stone extraction today.  GI following. 2. Elevated PT/INR-patient is on Coumadin for mechanical aortic valve.  She received vitamin K 2.5 mg IV x1  for elevated INR of 3.1.  PT/INR this morning is down to 1.6.  Patient is also on IV heparin for bridging.  Heparin will be on hold for ERCP and can be restarted after ERCP as per GI discretion. 3. Mechanical aortic valve-seen on transthoracic echo from 2018, patient is on Coumadin as outpatient.  Goal of INR 2.2-3.0 as per outpatient Coumadin clinic note.  Start heparin infusion for INR less than 2.2.  She received vitamin K as above.  Check PT/INR today. 4.  Anemia-continue iron supplementation 5. COPD-patient chronically on home O2 at home.  No acute exacerbation 6. Prolonged QTC-QTC was prolonged at the time of admission to 528.  Repeat EKG shows QTC 479.   Serum magnesium was  checked which was 2.2.   7. Hyperlipidemia-continue Lipitor 8. Acute kidney injury on CKD stage IIIb-creatinine is rising, patient baseline creatinine around 1.2.  Creatinine was elevated to 1.46, patient started on LR at 75 mL/h.  Follow BMP today.   9. Dementia-continue Aricept 10. Anxiety-continue Xanax as needed     COVID-19 Labs  No results for input(s): DDIMER, FERRITIN, LDH, CRP in the last 72 hours.  Lab Results  Component Value Date   SARSCOV2NAA NEGATIVE 09/08/2020   SARSCOV2NAA NEGATIVE 02/05/2020   SARSCOV2NAA DETECTED (A) 10/20/2019     Scheduled medications:   . [MAR Hold] donepezil  10 mg Oral Daily  . [MAR Hold] pregabalin  75 mg Oral Daily  . [MAR Hold] spironolactone  12.5 mg Oral Daily  . [MAR Hold] umeclidinium-vilanterol  1 puff Inhalation Daily    SpO2: 99 % O2 Flow Rate (L/min): 3 L/min FiO2 (%): 32 %    CBC: Recent Labs  Lab 09/08/20 1300 09/09/20 0545 09/10/20 0324 09/11/20 1123 09/12/20 2249  WBC 8.5 6.2 6.7 6.7 7.7  NEUTROABS 6.4 3.8  --   --   --   HGB 10.5* 9.3* 8.8* 9.1* 8.7*  HCT 32.8* 29.8* 28.4* 28.8* 27.7*  MCV 97.6 98.3 101.1* 98.6 98.9  PLT 175 158 160 158 150    Basic Metabolic Panel: Recent Labs  Lab 09/08/20 1300 09/09/20 0545 09/10/20 0324 09/11/20 1123 09/12/20 0402  NA 142 142 141 142 142  K 3.8 3.3* 3.9 3.9 3.9  CL 100 100 106 103 107  CO2 31 31 31 29 29   GLUCOSE 112*  84 75 106* 113*  BUN 24* 21 15 10 15   CREATININE 1.21* 1.21* 1.17* 1.32* 1.46*  CALCIUM 9.4 8.9 8.6* 8.8* 8.6*  MG  --  2.1 2.2  --   --      Liver Function Tests: Recent Labs  Lab 09/08/20 1300 09/09/20 0545 09/10/20 0324 09/11/20 1123 09/12/20 0402  AST 82* 36 27 27 22   ALT 32 23 17 18 15   ALKPHOS 72 62 56 57 53  BILITOT 0.9 0.8 0.8 0.8 0.7  PROT 7.1 6.1* 5.8* 5.8* 5.4*  ALBUMIN 4.1 3.5 3.2* 3.2* 2.9*     Antibiotics: Anti-infectives (From admission, onward)   Start     Dose/Rate Route Frequency Ordered Stop   09/12/20  0930  [MAR Hold]  cefTRIAXone (ROCEPHIN) 1 g in sodium chloride 0.9 % 100 mL IVPB        (MAR Hold since Sat 09/13/2020 at 1052.Hold Reason: Transfer to a Procedural area.)   1 g 200 mL/hr over 30 Minutes Intravenous Every 24 hours 09/12/20 0838     09/12/20 0930  metroNIDAZOLE (FLAGYL) IVPB 500 mg  Status:  Discontinued        500 mg 100 mL/hr over 60 Minutes Intravenous Every 8 hours 09/12/20 0838 09/12/20 0847   09/10/20 0900  ciprofloxacin (CIPRO) IVPB 400 mg  Status:  Discontinued        400 mg 200 mL/hr over 60 Minutes Intravenous Every 24 hours 09/09/20 1041 09/12/20 0838   09/09/20 0800  ciprofloxacin (CIPRO) IVPB 400 mg  Status:  Discontinued        400 mg 200 mL/hr over 60 Minutes Intravenous Every 12 hours 09/08/20 1908 09/09/20 1041   09/08/20 1800  ciprofloxacin (CIPRO) IVPB 400 mg        400 mg 200 mL/hr over 60 Minutes Intravenous  Once 09/08/20 1745 09/08/20 2000       DVT prophylaxis: Coumadin/heparin  Code Status: Full code  Family Communication: Discussed with patient's daughter at bedside  Consultants:  Gastroenterology    Objective   Vitals:   09/12/20 1920 09/13/20 0354 09/13/20 0725 09/13/20 0735  BP: (!) 149/48 (!) 148/56  (!) 149/48  Pulse: 66 64  60  Resp: 18 17  16   Temp: 98.4 F (36.9 C) 98 F (36.7 C)  98.2 F (36.8 C)  TempSrc: Oral Oral  Oral  SpO2: 100% 99% 98% 99%  Weight:      Height:        Intake/Output Summary (Last 24 hours) at 09/13/2020 1058 Last data filed at 09/13/2020 0831 Gross per 24 hour  Intake 1641.35 ml  Output 800 ml  Net 841.35 ml    10/28 1901 - 10/30 0700 In: 1641.4 [P.O.:240; I.V.:1301.4] Out: 650 [Urine:650]  Filed Weights   09/08/20 1227  Weight: 50.3 kg    Physical Examination:   General-appears in no acute distress Heart-S1-S2, regular, no murmur auscultated Lungs-clear to auscultation bilaterally, no wheezing or crackles auscultated Abdomen-soft, nontender, no organomegaly Extremities-no  edema in the lower extremities Neuro-alert, oriented x3, no focal deficit noted  Status is: Inpatient  Dispo: The patient is from: Home              Anticipated d/c is to: Home              Anticipated d/c date is: 09/15/2020              Patient currently not medically stable for discharge  Barrier to discharge-awaiting ERCP for choledocholithiasis  Data Reviewed:   Recent Results (from the past 240 hour(s))  Respiratory Panel by RT PCR (Flu A&B, Covid) - Nasopharyngeal Swab     Status: None   Collection Time: 09/08/20  8:22 PM   Specimen: Nasopharyngeal Swab  Result Value Ref Range Status   SARS Coronavirus 2 by RT PCR NEGATIVE NEGATIVE Final    Comment: (NOTE) SARS-CoV-2 target nucleic acids are NOT DETECTED.  The SARS-CoV-2 RNA is generally detectable in upper respiratoy specimens during the acute phase of infection. The lowest concentration of SARS-CoV-2 viral copies this assay can detect is 131 copies/mL. A negative result does not preclude SARS-Cov-2 infection and should not be used as the sole basis for treatment or other patient management decisions. A negative result may occur with  improper specimen collection/handling, submission of specimen other than nasopharyngeal swab, presence of viral mutation(s) within the areas targeted by this assay, and inadequate number of viral copies (<131 copies/mL). A negative result must be combined with clinical observations, patient history, and epidemiological information. The expected result is Negative.  Fact Sheet for Patients:  https://www.moore.com/  Fact Sheet for Healthcare Providers:  https://www.young.biz/  This test is no t yet approved or cleared by the [place] FDA and  has been authorized for detection and/or diagnosis of SARS-CoV-2 by FDA under an Emergency Use Authorization (EUA). This EUA will remain  in effect (meaning this test can be used) for the duration  of the COVID-19 declaration under Section 564(b)(1) of the Act, 21 U.S.C. section 360bbb-3(b)(1), unless the authorization is terminated or revoked sooner.     Influenza A by PCR NEGATIVE NEGATIVE Final   Influenza B by PCR NEGATIVE NEGATIVE Final    Comment: (NOTE) The Xpert Xpress SARS-CoV-2/FLU/RSV assay is intended as an aid in  the diagnosis of influenza from Nasopharyngeal swab specimens and  should not be used as a sole basis for treatment. Nasal washings and  aspirates are unacceptable for Xpert Xpress SARS-CoV-2/FLU/RSV  testing.  Fact Sheet for Patients: https://www.moore.com/  Fact Sheet for Healthcare Providers: https://www.young.biz/  This test is not yet approved or cleared by the [place] FDA and  has been authorized for detection and/or diagnosis of SARS-CoV-2 by  FDA under an Emergency Use Authorization (EUA). This EUA will remain  in effect (meaning this test can be used) for the duration of the  Covid-19 declaration under Section 564(b)(1) of the Act, 21  U.S.C. section 360bbb-3(b)(1), unless the authorization is  terminated or revoked. Performed at [facility], [address], [place], [place] [address]     Recent Labs  Lab 09/08/20 1300  LIPASE 49   No results for input(s): AMMONIA in the last 168 hours.  Cardiac Enzymes: No results for input(s): CKTOTAL, CKMB, CKMBINDEX, TROPONINI in the last 168 hours. BNP (last 3 results) Recent Labs    02/05/20 0727 02/07/20 1234 02/09/20 0632  BNP 1,712.0* 1,331.0* 386.0*    Enora Trillo S Eh Sesay   [facility] If 7PM-7AM, please contact night-coverage at [email or web address], Office  [phone]   09/13/2020, 10:58 AM  LOS: 5 days

## 2020-09-13 NOTE — Interval H&P Note (Signed)
History and Physical Interval Note:  09/13/2020 11:03 AM  [name]  has presented today for surgery, with the diagnosis of bile duct stone.  The various methods of treatment have been discussed with the patient and family. After consideration of risks, benefits and other options for treatment, the patient has consented to  Procedure(s): ENDOSCOPIC RETROGRADE CHOLANGIOPANCREATOGRAPHY (ERCP) (N/A) as a surgical intervention.  The patient's history has been reviewed, patient examined, no change in status, stable for surgery.  I have reviewed the patient's chart and labs.  Questions were answered to the patient's satisfaction.     Rachael Fee

## 2020-09-13 NOTE — Transfer of Care (Signed)
Immediate Anesthesia Transfer of Care Note  Patient: [name]  Procedure(s) Performed: ENDOSCOPIC RETROGRADE CHOLANGIOPANCREATOGRAPHY (ERCP) (N/A ) SPHINCTEROTOMY REMOVAL OF STONES  Patient Location: PACU  Anesthesia Type:General  Level of Consciousness: awake and alert   Airway & Oxygen Therapy: Patient Spontanous Breathing and Patient connected to face mask oxygen  Post-op Assessment: Report given to RN, Post -op Vital signs reviewed and stable and Patient moving all extremities  Post vital signs: Reviewed and stable  Last Vitals:  Vitals Value Taken Time  BP 177/65 09/13/20 1236  Temp    Pulse 67 09/13/20 1234  Resp 16 09/13/20 1234  SpO2 100 % 09/13/20 1234  Vitals shown include unvalidated device data.  Last Pain:  Vitals:   09/13/20 1102  TempSrc: Oral  PainSc: 0-No pain         Complications: No complications documented.

## 2020-09-13 NOTE — Anesthesia Postprocedure Evaluation (Signed)
Anesthesia Post Note  Patient: [name]  Procedure(s) Performed: ENDOSCOPIC RETROGRADE CHOLANGIOPANCREATOGRAPHY (ERCP) (N/A ) SPHINCTEROTOMY REMOVAL OF STONES     Patient location during evaluation: PACU Anesthesia Type: General Level of consciousness: awake Pain management: pain level controlled Vital Signs Assessment: post-procedure vital signs reviewed and stable Respiratory status: spontaneous breathing, nonlabored ventilation, respiratory function stable and patient connected to nasal cannula oxygen Cardiovascular status: blood pressure returned to baseline and stable Postop Assessment: no apparent nausea or vomiting Anesthetic complications: no   No complications documented.  Last Vitals:  Vitals:   09/13/20 1444 09/13/20 1537  BP:  (!) 154/43  Pulse: (!) 57 (!) 58  Resp:  17  Temp:  36.6 C  SpO2: 100% 99%    Last Pain:  Vitals:   09/13/20 1537  TempSrc: Oral  PainSc:                  Achilles Neville P Dracen Reigle

## 2020-09-13 NOTE — Op Note (Signed)
[facility] Patient Name: [name] Procedure Date : 09/13/2020 MRN: [id number] Attending MD: Rachael Fee , MD Date of Birth: 1926/11/03 CSN: [id number] Age: 86 Admit Type: Inpatient Procedure:                ERCP Indications:              brief abdominal pain, [place] and CT shows dilated                            extrahepatic biliary tree, gallstones in GB and CBD                            stone, normal LFTs Providers:                Rachael Fee, MD, Fayrene Fearing, RN, Roselie Awkward, RN, Lawson Radar, Technician, Tressia Miners, CRNA Referring MD:              Medicines:                General Anesthesia, Indomethacin 100 mg PR,                            Rocephin 1 g IV Complications:            No immediate complications. Estimated blood loss:                            None Estimated Blood Loss:     Estimated blood loss: none. Procedure:                Pre-Anesthesia Assessment:                           - Prior to the procedure, a History and Physical                            was performed, and patient medications and                            allergies were reviewed. The patient's tolerance of                            previous anesthesia was also reviewed. The risks                            and benefits of the procedure and the sedation                            options and risks were discussed with the patient.                            All questions  were answered, and informed consent                            was obtained. Prior Anticoagulants: The patient has                            taken Coumadin (warfarin), last dose was 5 days                            prior to procedure. ASA Grade Assessment: III - A                            patient with severe systemic disease. After                            reviewing the risks and benefits, the patient was                             deemed in satisfactory condition to undergo the                            procedure.                           After obtaining informed consent, the scope was                            passed under direct vision. Throughout the                            procedure, the patient's blood pressure, pulse, and                            oxygen saturations were monitored continuously. The                            [id number]-Q180V ([id number] Olympus Duodenoscope was                            introduced through the mouth, and used to inject                            contrast into and used for direct visualization of                            the bile duct. The ERCP was accomplished without                            difficulty. The patient tolerated the procedure                            well. Scope In: Scope Out: Findings:      The scout film was normal. A duodenal scope was advanced to the region  of the major papilla without detailed examination of the upper GI tract.       The major papilla was normal. A 44 autotome over a 0.035 Hydra wire was       used to cannulate the bile duct and contrast was injected. Cholangiogram       revealed diffusely dilated extrahepatic biliary tree, CBD was 12 mm.       There was a single, clear, round filling defect about 8 to 10 mm across       consistent with a stone. An overlapping cystic duct partially opacified       . I performed an adequate biliary sphincterotomy over the wire and then       swept the bile duct several times with a 12 to 15 mm retrieval balloon.       This delivered a single brown, conglomerated stone into the duodenum.       There was no purulence. Completion, occlusion cholangiogram showed no       remaining filling defects. The main pancreatic duct was cannulated with       a wire a single time but never injected with dye.. Impression:               - Choledocholithiasis was found. Complete removal                             was accomplished by biliary sphincterotomy and                            balloon extraction.                           - Overlapping cystic duct opacified with dye. Recommendation:           - Return patient to hospital ward for ongoing care.                           - I will order clear liquids, may advance as                            tolerated.                           - Consider elective lap chole.                           - It is OK to resume heparin for her mechanical                            heart valve at 4pm today. Would not resume coumadin                            until general surgery has decided about GB removal. Procedure Code(s):        --- Professional ---                           [phone], Endoscopic retrograde  cholangiopancreatography (ERCP); with removal of                            calculi/debris from biliary/pancreatic duct(s)                           43262, Endoscopic retrograde                            cholangiopancreatography (ERCP); with                            sphincterotomy/papillotomy Diagnosis Code(s):        --- Professional ---                           K80.50, Calculus of bile duct without cholangitis                            or cholecystitis without obstruction CPT copyright 2019 American Medical Association. All rights reserved. The codes documented in this report are preliminary and upon coder review may  be revised to meet current compliance requirements. Rachael Fee, MD 09/13/2020 12:14:02 PM This report has been signed electronically. Number of Addenda: 0

## 2020-09-13 NOTE — Progress Notes (Signed)
ANTICOAGULATION CONSULT NOTE  Pharmacy Consult:  Heparin Indication: Mechanical AVR   Allergies  Allergen Reactions  . Codeine Nausea And Vomiting  . Penicillins Other (See Comments)    Unknown reaction - listed on MAR Has patient had a PCN reaction causing immediate rash, facial/tongue/throat swelling, SOB or lightheadedness with hypotension: Unknown Has patient had a PCN reaction causing severe rash involving mucus membranes or skin necrosis: Unknown Has patient had a PCN reaction that required hospitalization: Unknown Has patient had a PCN reaction occurring within the last 10 years: Unknown If all of the above answers are "NO", then may proceed with Cephalosporin use.    Patient Measurements: Height: 5\' 2"  (157.5 cm) Weight: 50.3 kg (111 lb) IBW/kg (Calculated) : 50.1 Heparin Dosing Weight: 50 kg  Vital Signs: Temp: 98.4 F (36.9 C) (10/29 1920) Temp Source: Oral (10/29 1920) BP: 149/48 (10/29 1920) Pulse Rate: 66 (10/29 1920)  Labs: Recent Labs    09/10/20 0324 09/10/20 0324 09/11/20 1123 09/11/20 1123 09/12/20 0402 09/12/20 1450 09/12/20 2249  HGB 8.8*   < > 9.1*  --   --   --  8.7*  HCT 28.4*  --  28.8*  --   --   --  27.7*  PLT 160  --  158  --   --   --  150  LABPROT 30.7*   < > 28.7*   < > 23.9* 19.3* 18.2*  INR 3.1*   < > 2.8*   < > 2.2* 1.7* 1.6*  HEPARINUNFRC  --   --   --   --   --   --  0.27*  CREATININE 1.17*  --  1.32*  --  1.46*  --   --    < > = values in this interval not displayed.    Estimated Creatinine Clearance: 19.9 mL/min (A) (by C-G formula based on SCr of 1.46 mg/dL (H)).   Assessment: [name] presented with abdominal pain, found to have choledocholithiasis requiring surgery.  Patient has a history of mechanical AVR on Coumadin PTA and Pharmacy consulted to transition to IV heparin once INR falls below 2.2 (goal 2.2-3 per [facility] clinic note).    Heparin level slightly subtherapeutic (0.27) on gtt at 700 units/hr. No issues with line or  bleeding reported per RN.  Goal of Therapy:  Heparin level 0.3-0.7 units/mL Monitor platelets by anticoagulation protocol: Yes   Plan:  Increase heparin gtt to 800 units/hr Check 8 hr heparin level  [facility], PharmD, BCPS Please see amion for complete clinical pharmacist phone list 09/13/2020, 12:02 AM

## 2020-09-13 NOTE — Anesthesia Procedure Notes (Addendum)
Procedure Name: Intubation Date/Time: 09/13/2020 11:34 AM Performed by: Sammie Bench, CRNA Pre-anesthesia Checklist: Patient identified, Emergency Drugs available, Suction available and Patient being monitored Patient Re-evaluated:Patient Re-evaluated prior to induction Oxygen Delivery Method: Circle System Utilized Preoxygenation: Pre-oxygenation with 100% oxygen Induction Type: IV induction Ventilation: Mask ventilation without difficulty Laryngoscope Size: Mac and 3 Grade View: Grade I Tube type: Oral Tube size: 7.0 mm Number of attempts: 1 Airway Equipment and Method: Stylet and Oral airway Placement Confirmation: ETT inserted through vocal cords under direct vision,  positive ETCO2 and breath sounds checked- equal and bilateral Secured at: 23 cm Tube secured with: Tape Dental Injury: Teeth and Oropharynx as per pre-operative assessment

## 2020-09-13 NOTE — TOC Progression Note (Addendum)
Transition of Care [facility]) - Progression Note    Patient Details  Name: [name] MRN: [id number] Date of Birth: [birth year]  Transition of Care [facility] CM/SW Contact  Epifanio Lesches, RN Phone Number: 09/13/2020, 2:52 PM  Clinical Narrative:             - S/p ERCP 10/30 NCM spoke with pt @ bedside prior to procedure ( ERCP) with daughters in regard to d/c planning. PTA  active with Chief Operating Officer for out pt palliative services.  Pt plans to d/c to home with 24/7 care giver once medically ready. Pt is agreeable to home health services, preferable [facility]. Referral made with [facility] for home health services pending MD's orders...  TOC team will continue to monitor and follow for TOC  needs.....  Expected Discharge Plan: Home w Home Health Services Barriers to Discharge: Continued Medical Work up  Expected Discharge Plan and Services Expected Discharge Plan: Home w Home Health Services   Discharge Planning Services: CM Consult   Living arrangements for the past 2 months: Single Family Home                           HH Arranged: PT, RN [facility] Agency: [facility] Date [facility] Agency Contacted: 09/13/20 Time HH Agency Contacted: 1450 Representative spoke with at [facility] Agency: Kandee Keen   Social Determinants of Health (SDOH) Interventions    Readmission Risk Interventions Readmission Risk Prevention Plan 09/09/2020  Transportation Screening Complete  HRI or Home Care Consult Complete  Social Work Consult for Recovery Care Planning/Counseling Complete  Palliative Care Screening Complete  Medication Review Oceanographer) Complete  Some recent data might be hidden

## 2020-09-13 NOTE — Progress Notes (Signed)
ANTICOAGULATION CONSULT NOTE  Pharmacy Consult:  Heparin Indication: Mechanical AVR   Allergies  Allergen Reactions  . Codeine Nausea And Vomiting  . Penicillins Other (See Comments)    Unknown reaction - listed on MAR Has patient had a PCN reaction causing immediate rash, facial/tongue/throat swelling, SOB or lightheadedness with hypotension: Unknown Has patient had a PCN reaction causing severe rash involving mucus membranes or skin necrosis: Unknown Has patient had a PCN reaction that required hospitalization: Unknown Has patient had a PCN reaction occurring within the last 10 years: Unknown If all of the above answers are "NO", then may proceed with Cephalosporin use.    Patient Measurements: Height: 5\' 2"  (157.5 cm) Weight: 50.3 kg (110 lb 14.3 oz) IBW/kg (Calculated) : 50.1 Heparin Dosing Weight: 50 kg  Vital Signs: Temp: 97.5 F (36.4 C) (10/30 1321) Temp Source: Oral (10/30 1321) BP: 198/52 (10/30 1321) Pulse Rate: 67 (10/30 1321)  Labs: Recent Labs    09/11/20 1123 09/11/20 1123 09/12/20 0402 09/12/20 1450 09/12/20 2249  HGB 9.1*  --   --   --  8.7*  HCT 28.8*  --   --   --  27.7*  PLT 158  --   --   --  150  LABPROT 28.7*   < > 23.9* 19.3* 18.2*  INR 2.8*   < > 2.2* 1.7* 1.6*  HEPARINUNFRC  --   --   --   --  0.27*  CREATININE 1.32*  --  1.46*  --   --    < > = values in this interval not displayed.    Estimated Creatinine Clearance: 19.9 mL/min (A) (by C-G formula based on SCr of 1.46 mg/dL (H)).   Assessment: 79 YOF presented with abdominal pain, found to have choledocholithiasis requiring surgery.  Patient has a history of mechanical AVR on Coumadin PTA and Pharmacy consulted to transition to IV heparin once INR falls below 2.2 (goal 2.2-3 per [facility] clinic note).    Heparin infusion rate was increased to 800 units/hr at 0008 on 10/30. Heparin infusion was stopped at 0613 in anticipation of ERCP; no heparin level was obtained this morning. Per GI note,  can resume heparin infusion at 4 pm today.  Goal of Therapy:  Heparin level 0.3-0.7 units/mL Monitor platelets by anticoagulation protocol: Yes   Plan:  Resume heparin infusion at 800 units/hr starting at 4 pm Heparin level in 8 hours Monitor daily heparin levels, CBC, and s/sx of bleeding F/u general surgery plans   11/30, PharmD PGY2 Pharmacy Resident Phone between 7 am - 3:30 pm: Lulu Riding  Please check AMION for all [facility] Pharmacy phone numbers After 10:00 PM, call Main Pharmacy [phone]  09/13/2020, 1:29 PM

## 2020-09-14 ENCOUNTER — Encounter (HOSPITAL_COMMUNITY): Payer: Self-pay | Admitting: Gastroenterology

## 2020-09-14 DIAGNOSIS — I5042 Chronic combined systolic (congestive) and diastolic (congestive) heart failure: Secondary | ICD-10-CM | POA: Diagnosis not present

## 2020-09-14 DIAGNOSIS — I10 Essential (primary) hypertension: Secondary | ICD-10-CM | POA: Diagnosis not present

## 2020-09-14 DIAGNOSIS — K8050 Calculus of bile duct without cholangitis or cholecystitis without obstruction: Secondary | ICD-10-CM | POA: Diagnosis not present

## 2020-09-14 DIAGNOSIS — Z952 Presence of prosthetic heart valve: Secondary | ICD-10-CM | POA: Diagnosis not present

## 2020-09-14 LAB — CBC
HCT: 28 % — ABNORMAL LOW (ref 36.0–46.0)
Hemoglobin: 8.7 g/dL — ABNORMAL LOW (ref 12.0–15.0)
MCH: 30.3 pg (ref 26.0–34.0)
MCHC: 31.1 g/dL (ref 30.0–36.0)
MCV: 97.6 fL (ref 80.0–100.0)
Platelets: 158 10*3/uL (ref 150–400)
RBC: 2.87 MIL/uL — ABNORMAL LOW (ref 3.87–5.11)
RDW: 12.9 % (ref 11.5–15.5)
WBC: 4.9 10*3/uL (ref 4.0–10.5)
nRBC: 0 % (ref 0.0–0.2)

## 2020-09-14 LAB — HEPARIN LEVEL (UNFRACTIONATED): Heparin Unfractionated: 0.36 IU/mL (ref 0.30–0.70)

## 2020-09-14 LAB — PROTIME-INR
INR: 1.3 — ABNORMAL HIGH (ref 0.8–1.2)
Prothrombin Time: 15.6 seconds — ABNORMAL HIGH (ref 11.4–15.2)

## 2020-09-14 MED ORDER — HYDROXYZINE HCL 25 MG PO TABS
25.0000 mg | ORAL_TABLET | Freq: Once | ORAL | Status: AC
  Administered 2020-09-14: 25 mg via ORAL
  Filled 2020-09-14: qty 1

## 2020-09-14 MED ORDER — WARFARIN - PHARMACIST DOSING INPATIENT: Freq: Every day | Status: DC

## 2020-09-14 MED ORDER — WARFARIN SODIUM 5 MG PO TABS
5.0000 mg | ORAL_TABLET | Freq: Once | ORAL | Status: AC
  Administered 2020-09-14: 5 mg via ORAL
  Filled 2020-09-14: qty 1

## 2020-09-14 NOTE — Consult Note (Signed)
Re:   [name] DOB:   December 19, 1925 MRN:   [id number]  ASSESEMENT AND PLAN: 1.  Common bile duct stones  Successful ERCP - 09/13/2020 - [name]  She did well from this procedure and looks good.  2.  Cholelithiasis  Seen by Dr. Henreitta Simpson in [place] on 09/09/2020.  Dr. Leone Simpson was in the room with me while we discussed the options with the patient and her 2 daughters.  Normally, we would recommend proceeding with cholecystectomy after a stone is found in the CBD.  But general anesthesia and surgery carries significant risk for [name].  By the ACS risk calculator, her risk of serious complication is 10.7% and her risk of death 6.8%.  At this time the patient and family are not interested in surgery.  Dr. Leone Simpson has agreed to follow the patient, if there are further GI symptoms.  Please reconsult [place] if there are any further questions.   I have discussed this with Dr. Sharl Simpson.  3.  COPD  On O2 at home 4.  HTN 5.  CAD - sees Dr. Jens Simpson  Echo - EF - 55 - 60% 6.  Anticoagulated on Coumadin (for aortic valve)  INR - 1.3 - 09/14/2020 7.  Systolic and diastolic heart failure  Mechanical valve - AV replaced 1997.  Mild to moderate MV regurgitation on echo. 8.  Anemia  Hgb - 8.7 - 09/14/2020 9.  AAA - 3.1 cm 10.  Bilateral inguinal hernias on CT scan 11.  Palliative care secondary to COPD and CHF   Chief Complaint  Patient presents with  . Abdominal Pain   PHYSICIAN REQUESTING CONSULTATION: Colleen Linsey, MD  HISTORY OF PRESENT ILLNESS: [name] is a 86 y.o. (DOB: 19-Mar-1926) white female whose primary care physician is Colleen Linsey, MD.  Her two daughters, [name], and [name], are at the bedside.   She was admitted on 09/08/2020 with pain in RUQ that radiated to her back at [facility].  She was found to have a common bile duct stone.  She did see Dr. Henreitta Simpson, general surgery, at [facility] who told the family that she would not operate  on her. She was transferred to [facility] on 09/10/2020 for management of a common bile duct stone.  [name] had a successful ERCP stone extraction yesterday by Dr. Christella Simpson.  She looks good today and feels good.   Past Medical History:  Diagnosis Date  . Anemia   . Anxiety   . Arthritis   . Blood transfusion without reported diagnosis   . Carotid artery stenosis    Without infarction  . Cataract   . Chronic combined systolic (congestive) and diastolic (congestive) heart failure (HCC)   . COPD (chronic obstructive pulmonary disease) (HCC)    Wears 02 at home 2 L  . Coronary artery disease   . Heart valve replaced by other means   . Hypercholesterolemia    Pure  . Hypertension    Unspecified  . LBBB (left bundle branch block)   . Macular degeneration (senile) of retina, unspecified   . On home O2   . Postsurgical aortocoronary bypass status   . Stroke (HCC)   . Transient global amnesia   . Unspecified hereditary and idiopathic peripheral neuropathy   . Unspecified vitamin D deficiency       Past Surgical History:  Procedure Laterality Date  . AORTIC VALVE REPLACEMENT    . APPENDECTOMY    . CORONARY ARTERY BYPASS GRAFT    .  HIP PINNING,CANNULATED Right 07/18/2018   Procedure: RIGHT CANNULATED HIP PINNING;  Surgeon: Myrene Galas, MD;  Location: [facility] OR;  Service: Orthopedics;  Laterality: Right;  . TONSILLECTOMY        Current Facility-Administered Medications  Medication Dose Route Frequency Provider Last Rate Last Admin  . acetaminophen (TYLENOL) tablet 650 mg  650 mg Oral Q6H PRN Colleen Fee, MD   650 mg at 09/09/20 0545   Or  . acetaminophen (TYLENOL) suppository 650 mg  650 mg Rectal Q6H PRN Colleen Fee, MD      . ALPRAZolam Prudy Feeler) tablet 0.125-0.25 mg  0.125-0.25 mg Oral TID PRN Colleen Ide, MD   0.25 mg at 09/13/20 2355  . cefTRIAXone (ROCEPHIN) 1 g in sodium chloride 0.9 % 100 mL IVPB  1 g Intravenous Q24H Colleen Fee, MD 200 mL/hr at 09/14/20 0905  1 g at 09/14/20 0905  . donepezil (ARICEPT) tablet 10 mg  10 mg Oral Daily Colleen Fee, MD   10 mg at 09/14/20 7035  . heparin ADULT infusion 100 units/mL (25000 units/23mL sodium chloride 0.45%)  800 Units/hr Intravenous Continuous [name], Yujing Z, RPH 8 mL/hr at 09/14/20 1003 800 Units/hr at 09/14/20 1003  . ipratropium-albuterol (DUONEB) 0.5-2.5 (3) MG/3ML nebulizer solution 3 mL  3 mL Nebulization Q6H PRN Colleen Ide, MD   3 mL at 09/13/20 1451  . lactated ringers infusion   Intravenous Continuous Colleen Ide, MD 10 mL/hr at 09/13/20 1454 Rate Change at 09/13/20 1454  . ondansetron (ZOFRAN) tablet 4 mg  4 mg Oral Q6H PRN Colleen Fee, MD       Or  . ondansetron [facility]) injection 4 mg  4 mg Intravenous Q6H PRN Colleen Fee, MD   4 mg at 09/10/20 1154  . pregabalin (LYRICA) capsule 75 mg  75 mg Oral Daily Colleen Fee, MD   75 mg at 09/14/20 0093  . spironolactone (ALDACTONE) tablet 12.5 mg  12.5 mg Oral Daily Colleen Fee, MD   12.5 mg at 09/14/20 8182  . umeclidinium-vilanterol (ANORO ELLIPTA) 62.5-25 MCG/INH 1 puff  1 puff Inhalation Daily Colleen Fee, MD   1 puff at 09/14/20 9937      Allergies  Allergen Reactions  . Codeine Nausea And Vomiting  . Penicillins Other (See Comments)    Unknown reaction - listed on MAR Has patient had a PCN reaction causing immediate rash, facial/tongue/throat swelling, SOB or lightheadedness with hypotension: Unknown Has patient had a PCN reaction causing severe rash involving mucus membranes or skin necrosis: Unknown Has patient had a PCN reaction that required hospitalization: Unknown Has patient had a PCN reaction occurring within the last 10 years: Unknown If all of the above answers are "NO", then may proceed with Cephalosporin use.    SOCIAL and FAMILY HISTORY:  Live by self with sitters.  She is on 24 hours home O2 Her daughters, [name] and [name], are at the bedside.  PHYSICAL EXAM: BP (!)  162/47 (BP Location: Right Arm)   Pulse (!) 55   Temp 98 F (36.7 C) (Oral)   Resp 16   Ht 5\' 2"  (1.575 m)   Wt 50.3 kg   SpO2 100%   BMI 20.28 kg/m   General: Thin WF who is alert.  She is a little hard of hearing.  Skin:  Inspection and palpation - no mass or rash. Eyes:  Conjunctiva and lids unremarkable.  Pupils are equal Ears, Nose, Mouth, and Throat:  Ears and nose unremarkable            Lips and teeth are unremarable. Neck: Supple. No mass, trachea midline.  No thyroid mass. Lymph Nodes:  No supraclavicular, cervical, or inguinal nodes. Lungs: Normal respiratory effort.  Clear to auscultation and symmetric breath sounds. Heart:  Palpation of the heart is normal.            Auscultation: She has a 3/6 systolic murmur  Abdomen: Soft. No mass. No tenderness. No hernia.             Normal bowel sounds.   Musculoskeletal:  Good muscle strength and ROM  in upper and lower extremities.  Psychiatric: Normal judgement and insight. Behavior is normal.            Oriented to time, person, place.   DATA REVIEWED, COUNSELING AND COORDINATION OF CARE: Epic notes reviewed. I have personally seen and evaluated the patient, evaluated laboratory and imaging results, formulated the assessment and plan and placed orders. This requires high medical decision making. Total time spent with patient and charting: 50 minutes   Ovidio Kin, MD,  [facility], [name] [address] [place]  [address]   [place], [place]    [address] Phone:  [phone] FAX:  [phone]

## 2020-09-14 NOTE — Progress Notes (Addendum)
ANTICOAGULATION CONSULT NOTE  Pharmacy Consult for heparin/warfarin Indication: Mechanical AVR  Allergies  Allergen Reactions  . Codeine Nausea And Vomiting  . Penicillins Other (See Comments)    Unknown reaction - listed on MAR Has patient had a PCN reaction causing immediate rash, facial/tongue/throat swelling, SOB or lightheadedness with hypotension: Unknown Has patient had a PCN reaction causing severe rash involving mucus membranes or skin necrosis: Unknown Has patient had a PCN reaction that required hospitalization: Unknown Has patient had a PCN reaction occurring within the last 10 years: Unknown If all of the above answers are "NO", then may proceed with Cephalosporin use.    Patient Measurements: Height: 5\' 2"  (157.5 cm) Weight: 50.3 kg (110 lb 14.3 oz) IBW/kg (Calculated) : 50.1 Heparin Dosing Weight: 50.3 kg  Vital Signs: Temp: 98 F (36.7 C) (10/31 0816) Temp Source: Oral (10/31 0816) BP: 162/47 (10/31 0816) Pulse Rate: 55 (10/31 0816)  Labs: Recent Labs    09/11/20 1123 09/11/20 1123 09/12/20 0402 09/12/20 0402 09/12/20 1450 09/12/20 2249 09/13/20 1631 09/14/20 0234  HGB 9.1*   < >  --   --   --  8.7*  --  8.7*  HCT 28.8*  --   --   --   --  27.7*  --  28.0*  PLT 158  --   --   --   --  150  --  158  LABPROT 28.7*   < > 23.9*   < > 19.3* 18.2*  --  15.6*  INR 2.8*   < > 2.2*   < > 1.7* 1.6*  --  1.3*  HEPARINUNFRC  --   --   --   --   --  0.27*  --  0.36  CREATININE 1.32*  --  1.46*  --   --   --  1.15*  --    < > = values in this interval not displayed.    Estimated Creatinine Clearance: 25.2 mL/min (A) (by C-G formula based on SCr of 1.15 mg/dL (H)).   Medical History: Past Medical History:  Diagnosis Date  . Anemia   . Anxiety   . Arthritis   . Blood transfusion without reported diagnosis   . Carotid artery stenosis    Without infarction  . Cataract   . Chronic combined systolic (congestive) and diastolic (congestive) heart failure  (HCC)   . COPD (chronic obstructive pulmonary disease) (HCC)    Wears 02 at home 2 L  . Coronary artery disease   . Heart valve replaced by other means   . Hypercholesterolemia    Pure  . Hypertension    Unspecified  . LBBB (left bundle branch block)   . Macular degeneration (senile) of retina, unspecified   . On home O2   . Postsurgical aortocoronary bypass status   . Stroke (HCC)   . Transient global amnesia   . Unspecified hereditary and idiopathic peripheral neuropathy   . Unspecified vitamin D deficiency     Medications:  Scheduled:  . donepezil  10 mg Oral Daily  . pregabalin  75 mg Oral Daily  . spironolactone  12.5 mg Oral Daily  . umeclidinium-vilanterol  1 puff Inhalation Daily   Infusions:  . cefTRIAXone (ROCEPHIN)  IV 1 g (09/14/20 0905)  . heparin 800 Units/hr (09/14/20 1003)  . lactated ringers 10 mL/hr at 09/13/20 1454    Assessment: 89 YOF presented with abdominal pain, found to have choledocholithiasis. Patient has a history of mechanical AVR on warfarin  PTA. Warfarin was put on hold and bridging with heparin infusion while awaiting surgical plans. Today GI notes decision to hold off on any surgery at this point. Pharmacy has been consulted to resume warfarin.  Last warfarin dose was taken on 10/24, INR today down to 1.3. Heparin infusion is therapeutic at 800 units/hr. Renal function at baseline. No interacting medication noted for warfarin.  PTA warfarin dose: 5 mg daily except 2.5 mg MWF Goal of Therapy:  INR 2.2-3 per anticoag clinic note Monitor platelets by anticoagulation protocol: Yes   Plan:  Warfarin 5 mg PO once today Continue heparin infusion at 800 units/hr Monitor daily INR, heparin level, CBC, and s/sx of bleeding  Lulu Riding, PharmD PGY2 Pharmacy Resident Phone between 7 am - 3:30 pm: [phone]  Please check AMION for all [facility] Pharmacy phone numbers After 10:00 PM, call Main Pharmacy [phone]  09/14/2020,11:05 AM

## 2020-09-14 NOTE — Progress Notes (Signed)
   Patient Name: [name] Date of Encounter: 09/14/2020, 10:57 AM    Subjective  Patient is being followed for choledocholithiasis and cholelithiasis. She had ERCP with common duct stone extraction yesterday and surgery was consulted for consideration of cholecystectomy. Dr. Ezzard Standing is in the room as I see the patient is well as are both of her daughters.  She is not having abdominal pain   Objective  BP (!) 162/47 (BP Location: Right Arm)   Pulse (!) 55   Temp 98 F (36.7 C) (Oral)   Resp 16   Ht 5\' 2"  (1.575 m)   Wt 50.3 kg   SpO2 100%   BMI 20.28 kg/m  Elderly white woman spry for her age (almost 39) in no acute distress   Assessment and Plan  Choledocholithiasis and cholelithiasis, symptomatic choledocholithiasis led to admission  ERCP has extracted the common duct stone.  This lady is elderly and has aortic stenosis and other cardiac morbidity and Dr. 99 myself the patient and her daughters have reviewed things and though there is a chance of further complication of cholelithiasis we have all decided to hold off on any surgery for cholecystectomy at this point.   She does not need outpatient follow-up with GI after this admission.  However I will happily see her as needed abdominal pain and address that.   I am signing off at this point.  Diet ordered and resumption of warfarin ordered.  Ezzard Standing, MD, [facility] 09/14/2020 10:57 AM

## 2020-09-14 NOTE — Progress Notes (Signed)
ANTICOAGULATION CONSULT NOTE  Pharmacy Consult:  Heparin Indication: Mechanical AVR   Assessment: 75 YOF presented with abdominal pain, found to have choledocholithiasis requiring surgery.  Patient has a history of mechanical AVR on Coumadin PTA and Pharmacy consulted to transition to IV heparin once INR falls below 2.2 (goal 2.2-3 per [facility] clinic note).    Heparin infusion resumed post op. Heparin level this am 0.36 units/ml Hg 15.6, PTLC 158  Goal of Therapy:  Heparin level 0.3-0.7 units/mL Monitor platelets by anticoagulation protocol: Yes   Plan:  Continue heparin at 800 units/hr Monitor daily heparin levels, CBC, and s/sx of bleeding  Thanks for allowing pharmacy to be a part of this patient's care.  Talbert Cage, PharmD Clinical Pharmacist

## 2020-09-14 NOTE — Plan of Care (Signed)

## 2020-09-14 NOTE — Plan of Care (Signed)

## 2020-09-14 NOTE — Progress Notes (Signed)
[facility]  PROGRESS NOTE  [name] XNA:[id number] DOB: May 24, 1926 DOA: 09/08/2020 PCP: Oval Linsey, MD   Brief HPI:   86 year old female with history of vitamin D deficiency, transient global amnesia, stroke, COPD on 4 L nasal cannula at home, left bundle branch block, hypertension, hyperlipidemia, CAD, chronic combined systolic and diastolic CHF, carotid artery stenosis, mechanical heart valve.  Patient presents with abdominal pain with concern for acute cholecystitis and choledocholithiasis.  She was seen by general surgery and gastroenterology.  No plans for cholecystectomy, GI recommended ERCP.  Patient was transferred to [facility] as anesthesiologist did not feel comfortable patient to have procedure at [facility] hospital.    Subjective   Patient seen and examined, s/p ERCP done yesterday.  She underwent biliary sphincterotomy and extraction of the stone from common bile duct.   Assessment/Plan:     1. Choledocholithiasis-patient seen by general surgery and gastroenterology.  She does not have cholecystitis clinically.  Patient was started on ciprofloxacin, she underwent ERCP with extraction of CBD stone.  GI recommends cholecystectomy, will consult general surgery.  2. Elevated PT/INR-patient is on Coumadin for mechanical aortic valve.  She received vitamin K 2.5 mg IV x1  for elevated INR of 3.1.  PT/INR this morning is down to 1.6.  Patient is also on IV heparin for bridging.  Continue with heparin until seen by general surgery.  If no plan for surgery then will stop heparin and start back on Coumadin. 3. Mechanical aortic valve-seen on transthoracic echo from 2018, patient is on Coumadin as outpatient.  Goal of INR 2.2-3.0 as per outpatient Coumadin clinic note.  Coumadin is currently on hold.  Continue heparin. 4.  Anemia-continue iron supplementation 5. COPD-patient chronically on home O2 at home.  No acute exacerbation 6. Prolonged QTC-QTC was prolonged at the time  of admission to 528.  Repeat EKG shows QTC 479.   Serum magnesium was checked which was 2.2.   7. Hyperlipidemia-continue Lipitor 8. Acute kidney injury on CKD stage IIIb-creatinine is rising, patient baseline creatinine around 1.2.  Creatinine was elevated to 1.46, patient started on LR at 75 mL/h.  Creatinine is down to 1.15.  Follow BMP in am. 9. Dementia-continue Aricept 10. Anxiety-continue Xanax as needed     COVID-19 Labs  No results for input(s): DDIMER, FERRITIN, LDH, CRP in the last 72 hours.  Lab Results  Component Value Date   SARSCOV2NAA NEGATIVE 09/08/2020   SARSCOV2NAA NEGATIVE 02/05/2020   SARSCOV2NAA DETECTED (A) 10/20/2019     Scheduled medications:   . donepezil  10 mg Oral Daily  . pregabalin  75 mg Oral Daily  . spironolactone  12.5 mg Oral Daily  . umeclidinium-vilanterol  1 puff Inhalation Daily    SpO2: 100 % O2 Flow Rate (L/min): 4 L/min FiO2 (%): 32 %    CBC: Recent Labs  Lab 09/08/20 1300 09/08/20 1300 09/09/20 0545 09/10/20 0324 09/11/20 1123 09/12/20 2249 09/14/20 0234  WBC 8.5   < > 6.2 6.7 6.7 7.7 4.9  NEUTROABS 6.4  --  3.8  --   --   --   --   HGB 10.5*   < > 9.3* 8.8* 9.1* 8.7* 8.7*  HCT 32.8*   < > 29.8* 28.4* 28.8* 27.7* 28.0*  MCV 97.6   < > 98.3 101.1* 98.6 98.9 97.6  PLT 175   < > 158 160 158 150 158   < > = values in this interval not displayed.    Basic Metabolic Panel:  Recent Labs  Lab 09/09/20 0545 09/10/20 0324 09/11/20 1123 09/12/20 0402 09/13/20 1631  NA 142 141 142 142 143  K 3.3* 3.9 3.9 3.9 4.3  CL 100 106 103 107 107  CO2 31 31 29 29 26   GLUCOSE 84 75 106* 113* 114*  BUN 21 15 10 15 11   CREATININE 1.21* 1.17* 1.32* 1.46* 1.15*  CALCIUM 8.9 8.6* 8.8* 8.6* 8.8*  MG 2.1 2.2  --   --   --      Liver Function Tests: Recent Labs  Lab 09/08/20 1300 09/09/20 0545 09/10/20 0324 09/11/20 1123 09/12/20 0402  AST 82* 36 27 27 22   ALT 32 23 17 18 15   ALKPHOS 72 62 56 57 53  BILITOT 0.9 0.8 0.8 0.8  0.7  PROT 7.1 6.1* 5.8* 5.8* 5.4*  ALBUMIN 4.1 3.5 3.2* 3.2* 2.9*     Antibiotics: Anti-infectives (From admission, onward)   Start     Dose/Rate Route Frequency Ordered Stop   09/12/20 0930  cefTRIAXone (ROCEPHIN) 1 g in sodium chloride 0.9 % 100 mL IVPB        1 g 200 mL/hr over 30 Minutes Intravenous Every 24 hours 09/12/20 0838     09/12/20 0930  metroNIDAZOLE (FLAGYL) IVPB 500 mg  Status:  Discontinued        500 mg 100 mL/hr over 60 Minutes Intravenous Every 8 hours 09/12/20 0838 09/12/20 0847   09/10/20 0900  ciprofloxacin (CIPRO) IVPB 400 mg  Status:  Discontinued        400 mg 200 mL/hr over 60 Minutes Intravenous Every 24 hours 09/09/20 1041 09/12/20 0838   09/09/20 0800  ciprofloxacin (CIPRO) IVPB 400 mg  Status:  Discontinued        400 mg 200 mL/hr over 60 Minutes Intravenous Every 12 hours 09/08/20 1908 09/09/20 1041   09/08/20 1800  ciprofloxacin (CIPRO) IVPB 400 mg        400 mg 200 mL/hr over 60 Minutes Intravenous  Once 09/08/20 1745 09/08/20 2000       DVT prophylaxis: Coumadin/heparin  Code Status: Full code  Family Communication: Discussed with patient's daughter at bedside  Consultants:  Gastroenterology    Objective   Vitals:   09/13/20 1956 09/14/20 0004 09/14/20 0500 09/14/20 0816  BP: (!) 146/40 (!) 154/58 (!) 155/60 (!) 162/47  Pulse: (!) 55 (!) 51 60 (!) 55  Resp: 18 16 18 16   Temp: 97.7 F (36.5 C) 97.8 F (36.6 C) 97.8 F (36.6 C) 98 F (36.7 C)  TempSrc: Oral Oral Oral Oral  SpO2: 97% 100% 98% 100%  Weight:      Height:        Intake/Output Summary (Last 24 hours) at 09/14/2020 1010 Last data filed at 09/14/2020 0300 Gross per 24 hour  Intake 666.79 ml  Output 150 ml  Net 516.79 ml    10/29 1901 - 10/31 0700 In: 1478 [P.O.:480; I.V.:898] Out: 850 [Urine:850]  Filed Weights   09/08/20 1227 09/13/20 1102  Weight: 50.3 kg 50.3 kg    Physical Examination:   General-appears in no acute distress Heart-S1-S2,  regular, no murmur auscultated Lungs-clear to auscultation bilaterally, no wheezing or crackles auscultated Abdomen-soft, nontender, no organomegaly Extremities-no edema in the lower extremities Neuro-alert, oriented x3, no focal deficit noted  Status is: Inpatient  Dispo: The patient is from: Home              Anticipated d/c is to: Home  Anticipated d/c date is: 09/15/2020              Patient currently not medically stable for discharge  Barrier to discharge-awaiting ERCP for choledocholithiasis      Data Reviewed:   Recent Results (from the past 240 hour(s))  Respiratory Panel by RT PCR (Flu A&B, Covid) - Nasopharyngeal Swab     Status: None   Collection Time: 09/08/20  8:22 PM   Specimen: Nasopharyngeal Swab  Result Value Ref Range Status   SARS Coronavirus 2 by RT PCR NEGATIVE NEGATIVE Final    Comment: (NOTE) SARS-CoV-2 target nucleic acids are NOT DETECTED.  The SARS-CoV-2 RNA is generally detectable in upper respiratoy specimens during the acute phase of infection. The lowest concentration of SARS-CoV-2 viral copies this assay can detect is 131 copies/mL. A negative result does not preclude SARS-Cov-2 infection and should not be used as the sole basis for treatment or other patient management decisions. A negative result may occur with  improper specimen collection/handling, submission of specimen other than nasopharyngeal swab, presence of viral mutation(s) within the areas targeted by this assay, and inadequate number of viral copies (<131 copies/mL). A negative result must be combined with clinical observations, patient history, and epidemiological information. The expected result is Negative.  Fact Sheet for Patients:  https://www.moore.com/  Fact Sheet for Healthcare Providers:  https://www.young.biz/  This test is no t yet approved or cleared by the [place] FDA and  has been authorized for detection  and/or diagnosis of SARS-CoV-2 by FDA under an Emergency Use Authorization (EUA). This EUA will remain  in effect (meaning this test can be used) for the duration of the COVID-19 declaration under Section 564(b)(1) of the Act, 21 U.S.C. section 360bbb-3(b)(1), unless the authorization is terminated or revoked sooner.     Influenza A by PCR NEGATIVE NEGATIVE Final   Influenza B by PCR NEGATIVE NEGATIVE Final    Comment: (NOTE) The Xpert Xpress SARS-CoV-2/FLU/RSV assay is intended as an aid in  the diagnosis of influenza from Nasopharyngeal swab specimens and  should not be used as a sole basis for treatment. Nasal washings and  aspirates are unacceptable for Xpert Xpress SARS-CoV-2/FLU/RSV  testing.  Fact Sheet for Patients: https://www.moore.com/  Fact Sheet for Healthcare Providers: https://www.young.biz/  This test is not yet approved or cleared by the [place] FDA and  has been authorized for detection and/or diagnosis of SARS-CoV-2 by  FDA under an Emergency Use Authorization (EUA). This EUA will remain  in effect (meaning this test can be used) for the duration of the  Covid-19 declaration under Section 564(b)(1) of the Act, 21  U.S.C. section 360bbb-3(b)(1), unless the authorization is  terminated or revoked. Performed at [facility], [address] [place], [place] [address]     Recent Labs  Lab 09/08/20 1300  LIPASE 49   No results for input(s): AMMONIA in the last 168 hours.  Cardiac Enzymes: No results for input(s): CKTOTAL, CKMB, CKMBINDEX, TROPONINI in the last 168 hours. BNP (last 3 results) Recent Labs    02/05/20 0727 02/07/20 1234 02/09/20 0632  BNP 1,712.0* 1,331.0* 386.0*    Yarissa Reining [address]   [facility] If 7PM-7AM, please contact night-coverage at [email or web address], Office  [phone]   09/14/2020, 10:10 AM  LOS: 6 days

## 2020-09-15 ENCOUNTER — Inpatient Hospital Stay (HOSPITAL_COMMUNITY): Payer: Medicare Other

## 2020-09-15 DIAGNOSIS — Z952 Presence of prosthetic heart valve: Secondary | ICD-10-CM | POA: Diagnosis not present

## 2020-09-15 DIAGNOSIS — K8050 Calculus of bile duct without cholangitis or cholecystitis without obstruction: Secondary | ICD-10-CM | POA: Diagnosis not present

## 2020-09-15 DIAGNOSIS — I5042 Chronic combined systolic (congestive) and diastolic (congestive) heart failure: Secondary | ICD-10-CM | POA: Diagnosis not present

## 2020-09-15 DIAGNOSIS — I10 Essential (primary) hypertension: Secondary | ICD-10-CM | POA: Diagnosis not present

## 2020-09-15 LAB — BASIC METABOLIC PANEL
Anion gap: 10 (ref 5–15)
BUN: 14 mg/dL (ref 8–23)
CO2: 29 mmol/L (ref 22–32)
Calcium: 8.7 mg/dL — ABNORMAL LOW (ref 8.9–10.3)
Chloride: 104 mmol/L (ref 98–111)
Creatinine, Ser: 1.32 mg/dL — ABNORMAL HIGH (ref 0.44–1.00)
GFR, Estimated: 38 mL/min — ABNORMAL LOW (ref >60)
Glucose, Bld: 87 mg/dL (ref 70–99)
Potassium: 4.1 mmol/L (ref 3.5–5.1)
Sodium: 143 mmol/L (ref 135–145)

## 2020-09-15 LAB — CBC
HCT: 25.6 % — ABNORMAL LOW (ref 36.0–46.0)
Hemoglobin: 8 g/dL — ABNORMAL LOW (ref 12.0–15.0)
MCH: 30.3 pg (ref 26.0–34.0)
MCHC: 31.3 g/dL (ref 30.0–36.0)
MCV: 97 fL (ref 80.0–100.0)
Platelets: 160 10*3/uL (ref 150–400)
RBC: 2.64 MIL/uL — ABNORMAL LOW (ref 3.87–5.11)
RDW: 13.2 % (ref 11.5–15.5)
WBC: 11.3 10*3/uL — ABNORMAL HIGH (ref 4.0–10.5)
nRBC: 0 % (ref 0.0–0.2)

## 2020-09-15 LAB — PROTIME-INR
INR: 1.4 — ABNORMAL HIGH (ref 0.8–1.2)
Prothrombin Time: 16.2 seconds — ABNORMAL HIGH (ref 11.4–15.2)

## 2020-09-15 LAB — HEPARIN LEVEL (UNFRACTIONATED): Heparin Unfractionated: 0.43 IU/mL (ref 0.30–0.70)

## 2020-09-15 MED ORDER — FUROSEMIDE 10 MG/ML IJ SOLN
40.0000 mg | Freq: Once | INTRAMUSCULAR | Status: AC
  Administered 2020-09-15: 40 mg via INTRAVENOUS
  Filled 2020-09-15: qty 4

## 2020-09-15 MED ORDER — AZITHROMYCIN 500 MG PO TABS
500.0000 mg | ORAL_TABLET | Freq: Every day | ORAL | Status: DC
  Administered 2020-09-15 – 2020-09-16 (×2): 500 mg via ORAL
  Filled 2020-09-15 (×2): qty 1

## 2020-09-15 MED ORDER — ALBUTEROL SULFATE (2.5 MG/3ML) 0.083% IN NEBU: 2.5000 mg | INHALATION_SOLUTION | RESPIRATORY_TRACT | Status: DC | PRN

## 2020-09-15 MED ORDER — FUROSEMIDE 40 MG PO TABS
40.0000 mg | ORAL_TABLET | Freq: Every day | ORAL | Status: DC
  Administered 2020-09-15 – 2020-09-20 (×6): 40 mg via ORAL
  Filled 2020-09-15 (×2): qty 2
  Filled 2020-09-15: qty 1
  Filled 2020-09-15: qty 2
  Filled 2020-09-15 (×2): qty 1

## 2020-09-15 MED ORDER — WARFARIN SODIUM 5 MG PO TABS
5.0000 mg | ORAL_TABLET | Freq: Once | ORAL | Status: AC
  Administered 2020-09-15: 5 mg via ORAL
  Filled 2020-09-15: qty 1

## 2020-09-15 NOTE — Progress Notes (Signed)
Patient having shortness of breath despite breathing treatment and xanax.   Notified Blount MD of signs & symptoms, vistaril ordered and administered.  Respiratory therapist notified and came to bedside.  Respiratory called Rapid response nurse to further assess patient

## 2020-09-15 NOTE — Progress Notes (Signed)
ANTICOAGULATION CONSULT NOTE  Pharmacy Consult for heparin/warfarin Indication: Mechanical AVR  Patient Measurements: Height: 5\' 2"  (157.5 cm) Weight: 50.3 kg (110 lb 14.3 oz) IBW/kg (Calculated) : 50.1 Heparin Dosing Weight: 50.3 kg  Vital Signs: Temp: 98.9 F (37.2 C) (11/01 0808) Temp Source: Oral (11/01 0808) BP: 121/54 (11/01 0808) Pulse Rate: 78 (11/01 0823)  Labs: Recent Labs    09/12/20 2249 09/12/20 2249 09/13/20 1631 09/14/20 0234 09/15/20 0819  HGB 8.7*   < >  --  8.7* 8.0*  HCT 27.7*  --   --  28.0* 25.6*  PLT 150  --   --  158 160  LABPROT 18.2*  --   --  15.6* 16.2*  INR 1.6*  --   --  1.3* 1.4*  HEPARINUNFRC 0.27*  --   --  0.36 0.43  CREATININE  --   --  1.15*  --   --    < > = values in this interval not displayed.    Estimated Creatinine Clearance: 25.2 mL/min (A) (by C-G formula based on SCr of 1.15 mg/dL (H)).  Medications:  Scheduled:  . donepezil  10 mg Oral Daily  . furosemide  40 mg Oral Daily  . pregabalin  75 mg Oral Daily  . spironolactone  12.5 mg Oral Daily  . umeclidinium-vilanterol  1 puff Inhalation Daily  . Warfarin - Pharmacist Dosing Inpatient   Does not apply q1600   Infusions:  . cefTRIAXone (ROCEPHIN)  IV 1 g (09/14/20 0905)  . heparin 800 Units/hr (09/14/20 1003)  . lactated ringers 10 mL/hr at 09/13/20 1454    Assessment: 89 YOF presented with abdominal pain, found to have choledocholithiasis. Patient has a history of mechanical AVR on warfarin PTA. Warfarin was put on hold and bridging with heparin infusion while awaiting surgical plans. Today GI notes decision to hold off on any surgery at this point. Pharmacy has been consulted to resume warfarin 10/31  Heparin level is therapeutic and INR is low as expected at 1.4.   PTA warfarin dose: 5 mg daily except 2.5 mg MWF  Goal of Therapy:  INR 2.2-3 per anticoag clinic note Monitor platelets by anticoagulation protocol: Yes   Plan:  Repeat warfarin 5mg  PO x 1  today Continue heparin gtt 800 units/hr Daily heparin level, CBC and INR  11/31, PharmD, BCPS Clinical Pharmacist Please see AMION for all pharmacy numbers 09/15/2020 9:37 AM

## 2020-09-15 NOTE — Progress Notes (Signed)
[facility]  PROGRESS NOTE  [name] ZOX:[id number] DOB: [birth year] DOA: 09/08/2020 PCP: Oval Linsey, MD   Brief HPI:   86 year old female with history of vitamin D deficiency, transient global amnesia, stroke, COPD on 4 L nasal cannula at home, left bundle branch block, hypertension, hyperlipidemia, CAD, chronic combined systolic and diastolic CHF, carotid artery stenosis, mechanical heart valve.  Patient presents with abdominal pain with concern for acute cholecystitis and choledocholithiasis.  She was seen by general surgery and gastroenterology.  No plans for cholecystectomy, GI recommended ERCP.  Patient was transferred to [facility] as anesthesiologist did not feel comfortable patient to have procedure at [facility] hospital.    Subjective   Patient seen and examined, denies any complaints.  She was seen by general surgery yesterday, no plans for cholecystectomy at this time.   Assessment/Plan:    1. Choledocholithiasis-patient seen by general surgery and gastroenterology.  She does not have cholecystitis clinically.  Patient was started on ciprofloxacin, she underwent ERCP with extraction of CBD stone.  GI recommended cholecystectomy, general surgery saw the patient, at this time patient has refused to undergo cholecystectomy.  General surgery and GI agrees with the plan and does not see any emergent need for cholecystectomy. 2. Elevated PT/INR-patient is on Coumadin for mechanical aortic valve.  She received vitamin K 2.5 mg IV x1  for elevated INR of 3.1.  PT/INR this morning is down to 1.6.  Patient is also on IV heparin for bridging.  Coumadin has been restarted. 3. Mechanical aortic valve-seen on transthoracic echo from 2018, patient is on Coumadin as outpatient.  Goal of INR 2.2-3.0 as per outpatient Coumadin clinic note.  Continue Coumadin and heparin for bridging, INR is 1.42.  Will stop heparin after INR is more than 2.2.  Pharmacy managing Coumadin dosing. 4.   Anemia-continue iron supplementation 5. COPD-patient chronically on home O2 at home.  No acute exacerbation 6. Mild pulmonary edema-patient developed mild pulmonary edema as she has not been getting her Lasix.  She is +3 L fluid overload.  She received Lasix 40 mg IV last night with good diuresis.  Will restart home dose of Lasix 40 mg p.o. daily today.  Follow BMP in am. 7. Prolonged QTC-QTC was prolonged at the time of admission to 528.  Repeat EKG shows QTC 479.   Serum magnesium was checked which was 2.2.   8. Hyperlipidemia-continue Lipitor 9. Acute kidney injury on CKD stage IIIb-creatinine is rising, patient baseline creatinine around 1.2.  Creatinine was elevated to 1.46, patient started on LR at 75 mL/h.  Creatinine is 1.32 today.  LR was discontinued due to development of pulmonary edema.  Currently she is on KVO.  Follow BMP in am. 10. Dementia-continue Aricept 11. Anxiety-continue Xanax as needed     COVID-19 Labs  No results for input(s): DDIMER, FERRITIN, LDH, CRP in the last 72 hours.  Lab Results  Component Value Date   SARSCOV2NAA NEGATIVE 09/08/2020   SARSCOV2NAA NEGATIVE 02/05/2020   SARSCOV2NAA DETECTED (A) 10/20/2019     Scheduled medications:   . donepezil  10 mg Oral Daily  . furosemide  40 mg Oral Daily  . pregabalin  75 mg Oral Daily  . spironolactone  12.5 mg Oral Daily  . umeclidinium-vilanterol  1 puff Inhalation Daily  . warfarin  5 mg Oral ONCE-1600  . Warfarin - Pharmacist Dosing Inpatient   Does not apply q1600    SpO2: 100 % O2 Flow Rate (L/min): 4 L/min FiO2 (%): 36 %  CBC: Recent Labs  Lab 09/08/20 1300 09/08/20 1300 09/09/20 0545 09/09/20 0545 09/10/20 0324 09/11/20 1123 09/12/20 2249 09/14/20 0234 09/15/20 0819  WBC 8.5   < > 6.2   < > 6.7 6.7 7.7 4.9 11.3*  NEUTROABS 6.4  --  3.8  --   --   --   --   --   --   HGB 10.5*   < > 9.3*   < > 8.8* 9.1* 8.7* 8.7* 8.0*  HCT 32.8*   < > 29.8*   < > 28.4* 28.8* 27.7* 28.0* 25.6*   MCV 97.6   < > 98.3   < > 101.1* 98.6 98.9 97.6 97.0  PLT 175   < > 158   < > 160 158 150 158 160   < > = values in this interval not displayed.    Basic Metabolic Panel: Recent Labs  Lab 09/09/20 0545 09/09/20 0545 09/10/20 0324 09/11/20 1123 09/12/20 0402 09/13/20 1631 09/15/20 1045  NA 142   < > 141 142 142 143 143  K 3.3*   < > 3.9 3.9 3.9 4.3 4.1  CL 100   < > 106 103 107 107 104  CO2 31   < > 31 29 29 26 29   GLUCOSE 84   < > 75 106* 113* 114* 87  BUN 21   < > 15 10 15 11 14   CREATININE 1.21*   < > 1.17* 1.32* 1.46* 1.15* 1.32*  CALCIUM 8.9   < > 8.6* 8.8* 8.6* 8.8* 8.7*  MG 2.1  --  2.2  --   --   --   --    < > = values in this interval not displayed.     Liver Function Tests: Recent Labs  Lab 09/08/20 1300 09/09/20 0545 09/10/20 0324 09/11/20 1123 09/12/20 0402  AST 82* 36 27 27 22   ALT 32 23 17 18 15   ALKPHOS 72 62 56 57 53  BILITOT 0.9 0.8 0.8 0.8 0.7  PROT 7.1 6.1* 5.8* 5.8* 5.4*  ALBUMIN 4.1 3.5 3.2* 3.2* 2.9*     Antibiotics: Anti-infectives (From admission, onward)   Start     Dose/Rate Route Frequency Ordered Stop   09/12/20 0930  cefTRIAXone (ROCEPHIN) 1 g in sodium chloride 0.9 % 100 mL IVPB        1 g 200 mL/hr over 30 Minutes Intravenous Every 24 hours 09/12/20 0838     09/12/20 0930  metroNIDAZOLE (FLAGYL) IVPB 500 mg  Status:  Discontinued        500 mg 100 mL/hr over 60 Minutes Intravenous Every 8 hours 09/12/20 0838 09/12/20 0847   09/10/20 0900  ciprofloxacin (CIPRO) IVPB 400 mg  Status:  Discontinued        400 mg 200 mL/hr over 60 Minutes Intravenous Every 24 hours 09/09/20 1041 09/12/20 0838   09/09/20 0800  ciprofloxacin (CIPRO) IVPB 400 mg  Status:  Discontinued        400 mg 200 mL/hr over 60 Minutes Intravenous Every 12 hours 09/08/20 1908 09/09/20 1041   09/08/20 1800  ciprofloxacin (CIPRO) IVPB 400 mg        400 mg 200 mL/hr over 60 Minutes Intravenous  Once 09/08/20 1745 09/08/20 2000       DVT prophylaxis:  Coumadin/heparin  Code Status: Full code  Family Communication: Discussed with patient's daughter at bedside  Consultants:  Gastroenterology    Objective   Vitals:   09/15/20 09/10/20 09/15/20 09/10/20 09/15/20 6789  09/15/20 1100  BP:  (!) 121/54  (!) 132/43  Pulse: 85 77 78 74  Resp: 20 16 (!) 22 18  Temp:  98.9 F (37.2 C)  98.4 F (36.9 C)  TempSrc:  Oral  Oral  SpO2: 97% 100% 98% 100%  Weight:      Height:        Intake/Output Summary (Last 24 hours) at 09/15/2020 1257 Last data filed at 09/15/2020 0900 Gross per 24 hour  Intake 1040 ml  Output 1100 ml  Net -60 ml    10/30 1901 - 11/01 0700 In: 1826.8 [P.O.:1640; I.V.:86.8] Out: 1250 [Urine:1250]  Filed Weights   09/08/20 1227 09/13/20 1102  Weight: 50.3 kg 50.3 kg    Physical Examination:  General-appears in no acute distress Heart-S1-S2, regular, no murmur auscultated Lungs-clear to auscultation bilaterally, no wheezing or crackles auscultated Abdomen-soft, nontender, no organomegaly Extremities-no edema in the lower extremities Neuro-alert, oriented x3, no focal deficit noted  Status is: Inpatient  Dispo: The patient is from: Home              Anticipated d/c is to: Home              Anticipated d/c date is: 09/15/2020              Patient currently not medically stable for discharge  Barrier to discharge-awaiting ERCP for choledocholithiasis      Data Reviewed:   Recent Results (from the past 240 hour(s))  Respiratory Panel by RT PCR (Flu A&B, Covid) - Nasopharyngeal Swab     Status: None   Collection Time: 09/08/20  8:22 PM   Specimen: Nasopharyngeal Swab  Result Value Ref Range Status   SARS Coronavirus 2 by RT PCR NEGATIVE NEGATIVE Final    Comment: (NOTE) SARS-CoV-2 target nucleic acids are NOT DETECTED.  The SARS-CoV-2 RNA is generally detectable in upper respiratoy specimens during the acute phase of infection. The lowest concentration of SARS-CoV-2 viral copies this assay can detect  is 131 copies/mL. A negative result does not preclude SARS-Cov-2 infection and should not be used as the sole basis for treatment or other patient management decisions. A negative result may occur with  improper specimen collection/handling, submission of specimen other than nasopharyngeal swab, presence of viral mutation(s) within the areas targeted by this assay, and inadequate number of viral copies (<131 copies/mL). A negative result must be combined with clinical observations, patient history, and epidemiological information. The expected result is Negative.  Fact Sheet for Patients:  https://www.moore.com/https://www.fda.gov/media/142436/download  Fact Sheet for Healthcare Providers:  https://www.young.biz/https://www.fda.gov/media/142435/download  This test is no t yet approved or cleared by the [place] FDA and  has been authorized for detection and/or diagnosis of SARS-CoV-2 by FDA under an Emergency Use Authorization (EUA). This EUA will remain  in effect (meaning this test can be used) for the duration of the COVID-19 declaration under Section 564(b)(1) of the Act, 21 U.S.C. section 360bbb-3(b)(1), unless the authorization is terminated or revoked sooner.     Influenza A by PCR NEGATIVE NEGATIVE Final   Influenza B by PCR NEGATIVE NEGATIVE Final    Comment: (NOTE) The Xpert Xpress SARS-CoV-2/FLU/RSV assay is intended as an aid in  the diagnosis of influenza from Nasopharyngeal swab specimens and  should not be used as a sole basis for treatment. Nasal washings and  aspirates are unacceptable for Xpert Xpress SARS-CoV-2/FLU/RSV  testing.  Fact Sheet for Patients: https://www.moore.com/https://www.fda.gov/media/142436/download  Fact Sheet for Healthcare Providers: https://www.young.biz/https://www.fda.gov/media/142435/download  This test is not  yet approved or cleared by the [place] and  has been authorized for detection and/or diagnosis of SARS-CoV-2 by  FDA under an Emergency Use Authorization (EUA). This EUA will remain  in effect  (meaning this test can be used) for the duration of the  Covid-19 declaration under Section 564(b)(1) of the Act, 21  U.S.C. section 360bbb-3(b)(1), unless the authorization is  terminated or revoked. Performed at [facility], [address] [place], [place] [address]     Recent Labs  Lab 09/08/20 1300  LIPASE 49   No results for input(s): AMMONIA in the last 168 hours.  Cardiac Enzymes: No results for input(s): CKTOTAL, CKMB, CKMBINDEX, TROPONINI in the last 168 hours. BNP (last 3 results) Recent Labs    02/05/20 0727 02/07/20 1234 02/09/20 0632  BNP 1,712.0* 1,331.0* 386.0*    Crescencio Jozwiak S Avyon Herendeen   [facility] If 7PM-7AM, please contact night-coverage at [email or web address], Office  [phone]   09/15/2020, 12:57 PM  LOS: 7 days

## 2020-09-15 NOTE — Significant Event (Signed)
Rapid Response Event Note   Reason for Call :  Respiratory distress  Initial Focused Assessment:  Pt laying in bed in moderate respiratory distress. Pt alert and oriented, c/o SOB. Pt unable to speak in complete sentences. Lungs clear with crackles in bases. Skin warm and dry. T-98.4, HR-119, BP-168/77, RR-32, SpO2-97% on 4L [place].    Interventions:  Duo-neb, Vistaril given PTA RRT.  PCXR-1. Chronic appearing increased interstitial lung markings with mild bibasilar atelectasis and/or infiltrate. 2. Small left pleural effusion. EKG-ST with L BBB Lasix 40mg  IV x 2 Continous pulse ox monitoring.  Plan of Care:  Give lasix and monitor response. Place pt on continous pulse-ox monitoring. Continue to monitor pt closely. Call RRT if further assistance needed.    Event Summary:   MD Notified: , NP at 0110 Call Time:0023 Arrival [phone] End [address] RN

## 2020-09-15 NOTE — Care Management Important Message (Signed)
Important Message  Patient Details  Name: [name] MRN: [id number] Date of Birth: 10-24-1926   Medicare Important Message Given:  Yes - Important Message mailed due to current National Emergency  Verbal consent obtained due to current National Emergency  Relationship to patient: Child Contact Name: Lisbeth Ply Call Date: 09/15/20  Time: 1133 Phone: [phone] Outcome: No Answer/Busy Important Message mailed to: Patient address on file    Orson Aloe 09/15/2020, 11:33 AM

## 2020-09-15 NOTE — Plan of Care (Signed)
  Problem: Education: Goal: Knowledge of General Education information will improve Description: Including pain rating scale, medication(s)/side effects and non-pharmacologic comfort measures Outcome: Progressing   Problem: Health Behavior/Discharge Planning: Goal: Ability to manage health-related needs will improve Outcome: Progressing   Problem: Clinical Measurements: Goal: Diagnostic test results will improve Outcome: Progressing Goal: Respiratory complications will improve Outcome: Progressing   Problem: Pain Managment: Goal: General experience of comfort will improve Outcome: Progressing   Problem: Safety: Goal: Ability to remain free from injury will improve Outcome: Progressing   Problem: Skin Integrity: Goal: Risk for impaired skin integrity will decrease Outcome: Progressing

## 2020-09-15 NOTE — Progress Notes (Signed)
   09/15/20 0040  Assess: MEWS Score  Temp 98.4 F (36.9 C)  BP (!) 168/77  Pulse Rate (!) 119  Resp (!) 32  Level of Consciousness Alert  SpO2 97 %  O2 Device Nasal Cannula  O2 Flow Rate (L/min) 4 L/min  Assess: MEWS Score  MEWS Temp 0  MEWS Systolic 0  MEWS Pulse 2  MEWS RR 2  MEWS LOC 0  MEWS Score 4  MEWS Score Color Red  Assess: if the MEWS score is Yellow or Red  Were vital signs taken at a resting state? Yes  Focused Assessment Change from prior assessment (see assessment flowsheet)  Early Detection of Sepsis Score *See Row Information* High  MEWS guidelines implemented *See Row Information* Yes  Treat  MEWS Interventions Administered prn meds/treatments;Escalated (See documentation below);Consulted Respiratory Therapy  Pain Scale 0-10  Pain Score 0  Patients response to intervention Decreased  Take Vital Signs  Increase Vital Sign Frequency  Red: Q 1hr X 4 then Q 4hr X 4, if remains red, continue Q 4hrs  Escalate  MEWS: Escalate Red: discuss with charge nurse/RN and provider, consider discussing with RRT  Notify: Charge Nurse/RN  Name of Charge Nurse/RN Notified [place], RN  Date Charge Nurse/RN Notified 09/15/20  Time Charge Nurse/RN Notified 0040  Notify: Provider  Provider Name/Title [place]   Date Provider Notified 09/15/20  Time Provider Notified 0105  Notification Type Page  Notification Reason Other (Comment) (Mews score 2)  Response Other (Comment)  Notify: Rapid Response  Name of Rapid Response RN Notified Mindy, RN (Notified by respiratory therapist)  Date Rapid Response Notified 09/15/20  Time Rapid Response Notified 0028  Document  Progress note created (see row info) Yes

## 2020-09-15 NOTE — Evaluation (Signed)
Physical Therapy Evaluation Patient Details Name: [name] MRN: [id number] DOB: Apr 30, 1926 Today's Date: 09/15/2020   History of Present Illness  86 year old female with history of vitamin D deficiency, transient global amnesia, stroke, COPD on 4 L nasal cannula at home, left bundle branch block, hypertension, hyperlipidemia, CAD, chronic combined systolic and diastolic CHF, carotid artery stenosis, mechanical heart valve.  Patient presents with abdominal pain with concern for acute cholecystitis and choledocholithiasis. No plans for cholecystectomy. Patient underwent ERCP on 10/30.   Clinical Impression  PTA, patient lives alone with a 24/7 hired caregiver. Patient ambulated with no AD, however furniture walked in the home and HHA in the community. Patient is adamant about using stairs to get into her home when discharged rather than ramped entrance at front of house, will trial stairs next session or prior to discharge. Patient presents this date with decreased activity tolerance, impaired balance, generalized weakness, and impaired functional mobility. Patient on 4L O2 [place] upon arrival with spO2 at 100%. Patient ambulated 64' with RW and min guard, unable to get spO2 reading during ambulation. Upon sitting, spO2 was 96%. Patient will benefit from skilled PT services during acute stay to address listed deficits. Recommend HHPT and 24 hour assistance/supervision following discharge.     Follow Up Recommendations Home health PT;Supervision/Assistance - 24 hour    Equipment Recommendations  None recommended by PT (pt owns necessary DME)    Recommendations for Other Services       Precautions / Restrictions Precautions Precautions: Fall Restrictions Weight Bearing Restrictions: No      Mobility  Bed Mobility Overal bed mobility: Needs Assistance Bed Mobility: Sit to Supine       Sit to supine: Supervision (HOB flat)        Transfers Overall transfer level: Needs  assistance Equipment used: Rolling Makenzi Bannister (2 wheeled) Transfers: Sit to/from Stand Sit to Stand: Min guard            Ambulation/Gait Ambulation/Gait assistance: Min guard Gait Distance (Feet): 30 Feet Assistive device: Rolling Ozetta Flatley (2 wheeled) Gait Pattern/deviations: Step-through pattern;Decreased stride length;Wide base of support     General Gait Details: amb in room with 4L O2 [place]; unable to obtain spO2 reading during ambulation, however pt reports no SOB and upon sitting spO2 96%.   Stairs            Wheelchair Mobility    Modified Rankin (Stroke Patients Only)       Balance Overall balance assessment: Needs assistance Sitting-balance support: No upper extremity supported;Feet supported Sitting balance-Leahy Scale: Fair     Standing balance support: Single extremity supported;During functional activity Standing balance-Leahy Scale: Poor                               Pertinent Vitals/Pain Pain Assessment: Faces Faces Pain Scale: Hurts a little bit Pain Location: RUQ of abdomen Pain Descriptors / Indicators: Aching Pain Intervention(s): Limited activity within patient's tolerance;Monitored during session    Home Living Family/patient expects to be discharged to:: Private residence Living Arrangements: Alone (24/7 caregiver) Available Help at Discharge: Family;Personal care attendant;Available 24 hours/day Type of Home: House Home Access: Stairs to enter;Ramped entrance Entrance Stairs-Rails: Right Entrance Stairs-Number of Steps: 3 Home Layout: One level Home Equipment: Shower seat - built in;Tanessa Tidd - 2 wheels;Wyoma Genson - 4 wheels;Cane - single point;Bedside commode;Wheelchair - manual      Prior Function Level of Independence: Needs assistance   Gait / Transfers  Assistance Needed: patient is a household ambulator without use of AD, daughter reports patient furniture walks  ADL's / Homemaking Assistance Needed: caregiver/family  assists        Higher education careers adviser        Extremity/Trunk Assessment   Upper Extremity Assessment Upper Extremity Assessment: Generalized weakness    Lower Extremity Assessment Lower Extremity Assessment: Generalized weakness    Cervical / Trunk Assessment Cervical / Trunk Assessment: Kyphotic  Communication   Communication: HOH  Cognition Arousal/Alertness: Awake/alert Behavior During Therapy: WFL for tasks assessed/performed Overall Cognitive Status: Within Functional Limits for tasks assessed                                 General Comments: pt's daughter states she has typical age related memory changes but overall sharp as a tack      General Comments General comments (skin integrity, edema, etc.): daughter present throughout session and willing to assist     Exercises     Assessment/Plan    PT Assessment Patient needs continued PT services  PT Problem List Decreased strength;Decreased activity tolerance;Decreased balance;Decreased mobility;Cardiopulmonary status limiting activity       PT Treatment Interventions Gait training;Stair training;Functional mobility training;Therapeutic activities;Therapeutic exercise;Balance training;Patient/family education    PT Goals (Current goals can be found in the Care Plan section)  Acute Rehab PT Goals Patient Stated Goal: to go home PT Goal Formulation: With patient/family Time For Goal Achievement: 09/29/20 Potential to Achieve Goals: Good    Frequency Min 3X/week   Barriers to discharge        Co-evaluation               AM-PAC PT "6 Clicks" Mobility  Outcome Measure Help needed turning from your back to your side while in a flat bed without using bedrails?: A Little Help needed moving from lying on your back to sitting on the side of a flat bed without using bedrails?: A Little Help needed moving to and from a bed to a chair (including a wheelchair)?: A Little Help needed standing up from  a chair using your arms (e.g., wheelchair or bedside chair)?: A Little Help needed to walk in hospital room?: A Little Help needed climbing 3-5 steps with a railing? : A Little 6 Click Score: 18    End of Session Equipment Utilized During Treatment: Gait belt;Oxygen Activity Tolerance: Patient tolerated treatment well Patient left: in bed;with call bell/phone within reach;with family/visitor present Nurse Communication: Mobility status PT Visit Diagnosis: Unsteadiness on feet (R26.81);Other abnormalities of gait and mobility (R26.89);Muscle weakness (generalized) (M62.81)    Time: 7619-5093 PT Time Calculation (min) (ACUTE ONLY): 42 min   Charges:   PT Evaluation $PT Eval Moderate Complexity: 1 Mod PT Treatments $Gait Training: 8-22 mins $Therapeutic Activity: 8-22 mins        Gregor Hams, PT, DPT Acute Rehabilitation Services Pager [phone] Office [phone]   [name] 09/15/2020, 2:58 PM

## 2020-09-16 ENCOUNTER — Ambulatory Visit: Payer: Medicare Other | Admitting: Orthopaedic Surgery

## 2020-09-16 DIAGNOSIS — I10 Essential (primary) hypertension: Secondary | ICD-10-CM | POA: Diagnosis not present

## 2020-09-16 DIAGNOSIS — K8050 Calculus of bile duct without cholangitis or cholecystitis without obstruction: Secondary | ICD-10-CM | POA: Diagnosis not present

## 2020-09-16 DIAGNOSIS — Z952 Presence of prosthetic heart valve: Secondary | ICD-10-CM | POA: Diagnosis not present

## 2020-09-16 LAB — BASIC METABOLIC PANEL
Anion gap: 7 (ref 5–15)
BUN: 16 mg/dL (ref 8–23)
CO2: 31 mmol/L (ref 22–32)
Calcium: 8.6 mg/dL — ABNORMAL LOW (ref 8.9–10.3)
Chloride: 104 mmol/L (ref 98–111)
Creatinine, Ser: 1.3 mg/dL — ABNORMAL HIGH (ref 0.44–1.00)
GFR, Estimated: 39 mL/min — ABNORMAL LOW (ref >60)
Glucose, Bld: 101 mg/dL — ABNORMAL HIGH (ref 70–99)
Potassium: 3.7 mmol/L (ref 3.5–5.1)
Sodium: 142 mmol/L (ref 135–145)

## 2020-09-16 LAB — CBC
HCT: 26.2 % — ABNORMAL LOW (ref 36.0–46.0)
Hemoglobin: 8.1 g/dL — ABNORMAL LOW (ref 12.0–15.0)
MCH: 30.2 pg (ref 26.0–34.0)
MCHC: 30.9 g/dL (ref 30.0–36.0)
MCV: 97.8 fL (ref 80.0–100.0)
Platelets: 155 10*3/uL (ref 150–400)
RBC: 2.68 MIL/uL — ABNORMAL LOW (ref 3.87–5.11)
RDW: 13.5 % (ref 11.5–15.5)
WBC: 8.4 10*3/uL (ref 4.0–10.5)
nRBC: 0 % (ref 0.0–0.2)

## 2020-09-16 LAB — HEPARIN LEVEL (UNFRACTIONATED)
Heparin Unfractionated: 0.29 IU/mL — ABNORMAL LOW (ref 0.30–0.70)
Heparin Unfractionated: 0.25 IU/mL — ABNORMAL LOW (ref 0.30–0.70)

## 2020-09-16 LAB — PROTIME-INR
INR: 1.4 — ABNORMAL HIGH (ref 0.8–1.2)
Prothrombin Time: 16.6 seconds — ABNORMAL HIGH (ref 11.4–15.2)

## 2020-09-16 MED ORDER — HEPARIN BOLUS VIA INFUSION
1000.0000 [IU] | Freq: Once | INTRAVENOUS | Status: AC
  Administered 2020-09-16: 1000 [IU] via INTRAVENOUS
  Filled 2020-09-16: qty 1000

## 2020-09-16 MED ORDER — WARFARIN SODIUM 7.5 MG PO TABS
7.5000 mg | ORAL_TABLET | Freq: Once | ORAL | Status: AC
  Administered 2020-09-16: 7.5 mg via ORAL
  Filled 2020-09-16: qty 1

## 2020-09-16 MED ORDER — CARVEDILOL 3.125 MG PO TABS
3.1250 mg | ORAL_TABLET | Freq: Two times a day (BID) | ORAL | Status: DC
  Administered 2020-09-16 – 2020-09-20 (×8): 3.125 mg via ORAL
  Filled 2020-09-16 (×8): qty 1

## 2020-09-16 MED ORDER — ALBUTEROL SULFATE (2.5 MG/3ML) 0.083% IN NEBU: 2.5000 mg | INHALATION_SOLUTION | RESPIRATORY_TRACT | Status: DC | PRN

## 2020-09-16 NOTE — Plan of Care (Signed)
  Problem: Education: Goal: Knowledge of General Education information will improve Description: Including pain rating scale, medication(s)/side effects and non-pharmacologic comfort measures Outcome: Progressing   Problem: Health Behavior/Discharge Planning: Goal: Ability to manage health-related needs will improve Outcome: Progressing   Problem: Activity: Goal: Risk for activity intolerance will decrease Outcome: Progressing   Problem: Elimination: Goal: Will not experience complications related to bowel motility Outcome: Progressing   Problem: Pain Managment: Goal: General experience of comfort will improve Outcome: Progressing   Problem: Safety: Goal: Ability to remain free from injury will improve Outcome: Progressing   

## 2020-09-16 NOTE — Progress Notes (Signed)
ANTICOAGULATION CONSULT NOTE  Pharmacy Consult for heparin/warfarin Indication: Mechanical AVR  Patient Measurements: Height: 5\' 2"  (157.5 cm) Weight: 50.3 kg (110 lb 14.3 oz) IBW/kg (Calculated) : 50.1 Heparin Dosing Weight: 50.3 kg  Vital Signs: Temp: 98.5 F (36.9 C) (11/02 1600) Temp Source: Oral (11/02 1600) BP: 128/46 (11/02 1600) Pulse Rate: 79 (11/02 1600)  Labs: Recent Labs    09/14/20 0234 09/14/20 0234 09/15/20 0819 09/15/20 1045 09/16/20 0141 09/16/20 1628  HGB 8.7*   < > 8.0*  --  8.1*  --   HCT 28.0*  --  25.6*  --  26.2*  --   PLT 158  --  160  --  155  --   LABPROT 15.6*  --  16.2*  --  16.6*  --   INR 1.3*  --  1.4*  --  1.4*  --   HEPARINUNFRC 0.36   < > 0.43  --  0.29* 0.25*  CREATININE  --   --   --  1.32* 1.30*  --    < > = values in this interval not displayed.    Estimated Creatinine Clearance: 22.3 mL/min (A) (by C-G formula based on SCr of 1.3 mg/dL (H)).   Assessment: 32 YOF presented with abdominal pain, found to have choledocholithiasis. Patient has a history of mechanical AVR on warfarin PTA. Warfarin was put on hold and bridging with heparin infusion while awaiting surgical plans. Today GI notes decision to hold off on any surgery at this point. Pharmacy has been consulted to resume warfarin 10/31  Hep lvl low at 0.25   Warfarin already given today  Goal of Therapy:  INR 2.2-3 per anticoag clinic note Monitor platelets by anticoagulation protocol: Yes   Plan:  Heparin bolus 1000 units x 1 Increase gtt to 950 units/hr Recheck in am  11/31, PharmD, BCPS, BCCCP Clinical Pharmacist [phone]  Please check AMION for all [facility] Pharmacy numbers  09/16/2020 5:15 PM

## 2020-09-16 NOTE — Progress Notes (Addendum)
[facility]  PROGRESS NOTE  [name]:[id number] DOB: 03/16/1926 DOA: 09/08/2020 PCP: Oval Linsey, MD   Brief HPI:   86 year old female with history of vitamin D deficiency, transient global amnesia, stroke, COPD on 4 L nasal cannula at home, left bundle branch block, hypertension, hyperlipidemia, CAD, chronic combined systolic and diastolic CHF, carotid artery stenosis, mechanical heart valve.  Patient presents with abdominal pain with concern for acute cholecystitis and choledocholithiasis.  She was seen by general surgery and gastroenterology.  No plans for cholecystectomy, GI recommended ERCP.  Patient was transferred to [facility] as anesthesiologist did not feel comfortable patient to have procedure at [facility] hospital.    Subjective   Patient seen and examined, denies coughing up blood today.  No shortness of breath.  Diuresing well with p.o. Lasix.     Assessment/Plan:    1. Choledocholithiasis-patient seen by general surgery and gastroenterology.  She does not have cholecystitis clinically.  Patient was started on ciprofloxacin, she underwent ERCP with extraction of CBD stone.  GI recommended cholecystectomy, general surgery saw the patient, at this time patient has refused to undergo cholecystectomy.  General surgery and GI agrees with the plan and does not see any emergent need for cholecystectomy. 2. Mechanical aortic valve-seen on transthoracic echo from 2018, patient is on Coumadin as outpatient.  Goal of INR 2.2-3.0 as per outpatient Coumadin clinic note.  Continue Coumadin and heparin for bridging, INR is 1.4.  Will stop heparin after INR is more than 2.2.  Pharmacy managing Coumadin dosing. 3. Hemoptysis-patient developed hemoptysis yesterday, likely in the setting of pulmonary edema along with anticoagulation with heparin and Coumadin bridging.  Chest x-ray showed mild bibasilar atelectasis versus infiltrate.  She has been on ceftriaxone for past 8 days, she  was also started on Zithromax yesterday.  I doubt she has pneumonia, clinically she is stable.  We will continue with diuresis for pulmonary edema, and discontinue antibiotics. 4. Anemia-continue iron supplementation 5. COPD-patient chronically on home O2 at home.  No acute exacerbation 6. Mild pulmonary edema-patient developed mild pulmonary edema as she has not been getting her Lasix.  She was +3 L fluid overload, received IV Lasix 40 mg x 1 with good diuresis.  Patient started back on home dose of Lasix 40 mg daily.  . 7. Prolonged QTC-QTC was prolonged at the time of admission to 528.  Repeat EKG shows QTC 479.   Serum magnesium was checked which was 2.2.   8. Hyperlipidemia-continue Lipitor 9. Acute kidney injury on CKD stage IIIb-creatinine is rising, patient baseline creatinine around 1.2.  Creatinine was elevated to 1.46, patient started on LR at 75 mL/h.  IV fluids were discontinued as patient developed pulmonary edema.  Creatinine is 1.30 today.  LR was discontinued due to development of pulmonary edema.  Currently she is on KVO.  Follow BMP in am. 10. Dementia-continue Aricept 11. Anxiety-continue Xanax as needed 12. Hypertension-blood pressure is now mildly elevated, continue Aldactone, will restart Coreg.     COVID-19 Labs  No results for input(s): DDIMER, FERRITIN, LDH, CRP in the last 72 hours.  Lab Results  Component Value Date   SARSCOV2NAA NEGATIVE 09/08/2020   SARSCOV2NAA NEGATIVE 02/05/2020   SARSCOV2NAA DETECTED (A) 10/20/2019     Scheduled medications:   . azithromycin  500 mg Oral Daily  . donepezil  10 mg Oral Daily  . furosemide  40 mg Oral Daily  . pregabalin  75 mg Oral Daily  . spironolactone  12.5 mg Oral  Daily  . umeclidinium-vilanterol  1 puff Inhalation Daily  . warfarin  7.5 mg Oral ONCE-1600  . Warfarin - Pharmacist Dosing Inpatient   Does not apply q1600    SpO2: 98 % O2 Flow Rate (L/min): 4 L/min FiO2 (%): 36 %    CBC: Recent Labs  Lab  09/11/20 1123 09/12/20 2249 09/14/20 0234 09/15/20 0819 09/16/20 0141  WBC 6.7 7.7 4.9 11.3* 8.4  HGB 9.1* 8.7* 8.7* 8.0* 8.1*  HCT 28.8* 27.7* 28.0* 25.6* 26.2*  MCV 98.6 98.9 97.6 97.0 97.8  PLT 158 150 158 160 155    Basic Metabolic Panel: Recent Labs  Lab 09/10/20 0324 09/10/20 0324 09/11/20 1123 09/12/20 0402 09/13/20 1631 09/15/20 1045 09/16/20 0141  NA 141   < > 142 142 143 143 142  K 3.9   < > 3.9 3.9 4.3 4.1 3.7  CL 106   < > 103 107 107 104 104  CO2 31   < > 29 29 26 29 31   GLUCOSE 75   < > 106* 113* 114* 87 101*  BUN 15   < > 10 15 11 14 16   CREATININE 1.17*   < > 1.32* 1.46* 1.15* 1.32* 1.30*  CALCIUM 8.6*   < > 8.8* 8.6* 8.8* 8.7* 8.6*  MG 2.2  --   --   --   --   --   --    < > = values in this interval not displayed.     Liver Function Tests: Recent Labs  Lab 09/10/20 0324 09/11/20 1123 09/12/20 0402  AST 27 27 22   ALT 17 18 15   ALKPHOS 56 57 53  BILITOT 0.8 0.8 0.7  PROT 5.8* 5.8* 5.4*  ALBUMIN 3.2* 3.2* 2.9*     Antibiotics: Anti-infectives (From admission, onward)   Start     Dose/Rate Route Frequency Ordered Stop   09/15/20 1430  azithromycin (ZITHROMAX) tablet 500 mg        500 mg Oral Daily 09/15/20 1344     09/12/20 0930  cefTRIAXone (ROCEPHIN) 1 g in sodium chloride 0.9 % 100 mL IVPB        1 g 200 mL/hr over 30 Minutes Intravenous Every 24 hours 09/12/20 0838     09/12/20 0930  metroNIDAZOLE (FLAGYL) IVPB 500 mg  Status:  Discontinued        500 mg 100 mL/hr over 60 Minutes Intravenous Every 8 hours 09/12/20 0838 09/12/20 0847   09/10/20 0900  ciprofloxacin (CIPRO) IVPB 400 mg  Status:  Discontinued        400 mg 200 mL/hr over 60 Minutes Intravenous Every 24 hours 09/09/20 1041 09/12/20 0838   09/09/20 0800  ciprofloxacin (CIPRO) IVPB 400 mg  Status:  Discontinued        400 mg 200 mL/hr over 60 Minutes Intravenous Every 12 hours 09/08/20 1908 09/09/20 1041   09/08/20 1800  ciprofloxacin (CIPRO) IVPB 400 mg        400  mg 200 mL/hr over 60 Minutes Intravenous  Once 09/08/20 1745 09/08/20 2000       DVT prophylaxis: Coumadin/heparin  Code Status: DNR  Family Communication: Discussed with patient's daughter at bedside  Consultants:  Gastroenterology    Objective   Vitals:   09/16/20 0400 09/16/20 0435 09/16/20 0741 09/16/20 0820  BP: (!) 118/47  (!) 160/55   Pulse: 68 65 70   Resp: 16  20   Temp: 98.3 F (36.8 C)  98.2 F (36.8 C)  TempSrc: Oral  Oral   SpO2: 97% 99% 100% 98%  Weight:      Height:        Intake/Output Summary (Last 24 hours) at 09/16/2020 1884 Last data filed at 09/15/2020 2101 Gross per 24 hour  Intake 240 ml  Output 750 ml  Net -510 ml    10/31 1901 - 11/02 0700 In: 600 [P.O.:600] Out: 1850 [Urine:1850]  Filed Weights   09/08/20 1227 09/13/20 1102  Weight: 50.3 kg 50.3 kg    Physical Examination:  General-appears in no acute distress Heart-S1-S2, regular, no murmur auscultated Lungs-clear to auscultation bilaterally, no wheezing or crackles auscultated Abdomen-soft, nontender, no organomegaly Extremities-no edema in the lower extremities Neuro-alert, oriented x3, no focal deficit noted  Status is: Inpatient  Dispo: The patient is from: Home              Anticipated d/c is to: Home              Anticipated d/c date is: 09/17/2020              Patient currently not medically stable for discharge  Barrier to discharge-awaiting INR to be more than 2.2, she has mechanical aortic valve.      Data Reviewed:   Recent Results (from the past 240 hour(s))  Respiratory Panel by RT PCR (Flu A&B, Covid) - Nasopharyngeal Swab     Status: None   Collection Time: 09/08/20  8:22 PM   Specimen: Nasopharyngeal Swab  Result Value Ref Range Status   SARS Coronavirus 2 by RT PCR NEGATIVE NEGATIVE Final    Comment: (NOTE) SARS-CoV-2 target nucleic acids are NOT DETECTED.  The SARS-CoV-2 RNA is generally detectable in upper respiratoy specimens during the  acute phase of infection. The lowest concentration of SARS-CoV-2 viral copies this assay can detect is 131 copies/mL. A negative result does not preclude SARS-Cov-2 infection and should not be used as the sole basis for treatment or other patient management decisions. A negative result may occur with  improper specimen collection/handling, submission of specimen other than nasopharyngeal swab, presence of viral mutation(s) within the areas targeted by this assay, and inadequate number of viral copies (<131 copies/mL). A negative result must be combined with clinical observations, patient history, and epidemiological information. The expected result is Negative.  Fact Sheet for Patients:  https://www.moore.com/  Fact Sheet for Healthcare Providers:  https://www.young.biz/  This test is no t yet approved or cleared by the [place] FDA and  has been authorized for detection and/or diagnosis of SARS-CoV-2 by FDA under an Emergency Use Authorization (EUA). This EUA will remain  in effect (meaning this test can be used) for the duration of the COVID-19 declaration under Section 564(b)(1) of the Act, 21 U.S.C. section 360bbb-3(b)(1), unless the authorization is terminated or revoked sooner.     Influenza A by PCR NEGATIVE NEGATIVE Final   Influenza B by PCR NEGATIVE NEGATIVE Final    Comment: (NOTE) The Xpert Xpress SARS-CoV-2/FLU/RSV assay is intended as an aid in  the diagnosis of influenza from Nasopharyngeal swab specimens and  should not be used as a sole basis for treatment. Nasal washings and  aspirates are unacceptable for Xpert Xpress SARS-CoV-2/FLU/RSV  testing.  Fact Sheet for Patients: https://www.moore.com/  Fact Sheet for Healthcare Providers: https://www.young.biz/  This test is not yet approved or cleared by the [place] FDA and  has been authorized for detection and/or diagnosis  of SARS-CoV-2 by  FDA under an Emergency Use Authorization (  EUA). This EUA will remain  in effect (meaning this test can be used) for the duration of the  Covid-19 declaration under Section 564(b)(1) of the Act, 21  U.S.C. section 360bbb-3(b)(1), unless the authorization is  terminated or revoked. Performed at [facility], [address] [place], [place] [address]     No results for input(s): LIPASE, AMYLASE in the last 168 hours. No results for input(s): AMMONIA in the last 168 hours.  Cardiac Enzymes: No results for input(s): CKTOTAL, CKMB, CKMBINDEX, TROPONINI in the last 168 hours. BNP (last 3 results) Recent Labs    02/05/20 0727 02/07/20 1234 02/09/20 0632  BNP 1,712.0* 1,331.0* 386.0*    Yen Wandell S Earleen Aoun   [facility] If 7PM-7AM, please contact night-coverage at [email or web address], Office  [phone]   09/16/2020, 9:22 AM  LOS: 8 days

## 2020-09-16 NOTE — Progress Notes (Signed)
ANTICOAGULATION CONSULT NOTE  Pharmacy Consult for heparin/warfarin Indication: Mechanical AVR  Patient Measurements: Height: 5\' 2"  (157.5 cm) Weight: 50.3 kg (110 lb 14.3 oz) IBW/kg (Calculated) : 50.1 Heparin Dosing Weight: 50.3 kg  Vital Signs: Temp: 98.3 F (36.8 C) (11/02 0400) Temp Source: Oral (11/02 0400) BP: 118/47 (11/02 0400) Pulse Rate: 65 (11/02 0435)  Labs: Recent Labs    09/13/20 1631 09/14/20 0234 09/14/20 0234 09/15/20 0819 09/15/20 1045 09/16/20 0141  HGB  --  8.7*   < > 8.0*  --  8.1*  HCT  --  28.0*  --  25.6*  --  26.2*  PLT  --  158  --  160  --  155  LABPROT  --  15.6*  --  16.2*  --  16.6*  INR  --  1.3*  --  1.4*  --  1.4*  HEPARINUNFRC  --  0.36  --  0.43  --  0.29*  CREATININE 1.15*  --   --   --  1.32* 1.30*   < > = values in this interval not displayed.    Estimated Creatinine Clearance: 22.3 mL/min (A) (by C-G formula based on SCr of 1.3 mg/dL (H)).  Medications:  Scheduled:  . azithromycin  500 mg Oral Daily  . donepezil  10 mg Oral Daily  . furosemide  40 mg Oral Daily  . pregabalin  75 mg Oral Daily  . spironolactone  12.5 mg Oral Daily  . umeclidinium-vilanterol  1 puff Inhalation Daily  . Warfarin - Pharmacist Dosing Inpatient   Does not apply q1600   Infusions:  . cefTRIAXone (ROCEPHIN)  IV 1 g (09/15/20 1010)  . heparin 800 Units/hr (09/15/20 2013)  . lactated ringers 10 mL/hr at 09/13/20 1454    Assessment: 89 YOF presented with abdominal pain, found to have choledocholithiasis. Patient has a history of mechanical AVR on warfarin PTA. Warfarin was put on hold and bridging with heparin infusion while awaiting surgical plans. Today GI notes decision to hold off on any surgery at this point. Pharmacy has been consulted to resume warfarin 10/31  Heparin level is slightly subtherapeutic (0.29) and INR is low as expected at 1.4. Will give small boost with warfarin dose today, then likely resume home dose tomorrow.   PTA  warfarin dose: 5 mg daily except 2.5 mg MWF  Goal of Therapy:  INR 2.2-3 per anticoag clinic note Monitor platelets by anticoagulation protocol: Yes   Plan:  Give warfarin 7.5mg  PO x 1 today Increase heparin gtt 850 units/hr Heparin level in 8 hours Daily heparin level, CBC and INR  Aairah Negrette A. 11/31, PharmD, BCPS, FNKF Clinical Pharmacist [place] Please utilize Amion for appropriate phone number to reach the unit pharmacist [facility])   09/16/2020 7:41 AM

## 2020-09-17 ENCOUNTER — Encounter (INDEPENDENT_AMBULATORY_CARE_PROVIDER_SITE_OTHER): Payer: Medicare Other | Admitting: Ophthalmology

## 2020-09-17 DIAGNOSIS — K8050 Calculus of bile duct without cholangitis or cholecystitis without obstruction: Secondary | ICD-10-CM | POA: Diagnosis not present

## 2020-09-17 LAB — CBC
HCT: 24.2 % — ABNORMAL LOW (ref 36.0–46.0)
Hemoglobin: 7.5 g/dL — ABNORMAL LOW (ref 12.0–15.0)
MCH: 31.1 pg (ref 26.0–34.0)
MCHC: 31 g/dL (ref 30.0–36.0)
MCV: 100.4 fL — ABNORMAL HIGH (ref 80.0–100.0)
Platelets: 150 10*3/uL (ref 150–400)
RBC: 2.41 MIL/uL — ABNORMAL LOW (ref 3.87–5.11)
RDW: 13.4 % (ref 11.5–15.5)
WBC: 6.8 10*3/uL (ref 4.0–10.5)
nRBC: 0 % (ref 0.0–0.2)

## 2020-09-17 LAB — HEMOGLOBIN AND HEMATOCRIT, BLOOD
HCT: 28.6 % — ABNORMAL LOW (ref 36.0–46.0)
Hemoglobin: 8.9 g/dL — ABNORMAL LOW (ref 12.0–15.0)

## 2020-09-17 LAB — PROTIME-INR
INR: 1.3 — ABNORMAL HIGH (ref 0.8–1.2)
Prothrombin Time: 15.9 seconds — ABNORMAL HIGH (ref 11.4–15.2)

## 2020-09-17 LAB — HEPARIN LEVEL (UNFRACTIONATED)
Heparin Unfractionated: 0.17 IU/mL — ABNORMAL LOW (ref 0.30–0.70)
Heparin Unfractionated: 0.47 IU/mL (ref 0.30–0.70)
Heparin Unfractionated: 0.35 IU/mL (ref 0.30–0.70)

## 2020-09-17 MED ORDER — WARFARIN SODIUM 7.5 MG PO TABS
7.5000 mg | ORAL_TABLET | Freq: Once | ORAL | Status: AC
  Administered 2020-09-17: 7.5 mg via ORAL
  Filled 2020-09-17: qty 1

## 2020-09-17 NOTE — Progress Notes (Signed)
ANTICOAGULATION CONSULT NOTE  Pharmacy Consult for heparin Indication: Mechanical AVR  Assessment: 45 YOF presented with abdominal pain, found to have choledocholithiasis. Patient has a history of mechanical AVR on warfarin PTA. Warfarin was put on hold and bridging with heparin infusion while awaiting surgical plans. Today GI notes decision to hold off on any surgery at this point. Pharmacy has been consulted to resume warfarin 10/31  Heparin level reported 0.17 units/ml.  Rn checked pump and heparin not infusing  Goal of Therapy:  INR 2.2-3 per anticoag clinic note Monitor platelets by anticoagulation protocol: Yes   Plan:  Continue heparin at 950 units/hr Check heparin level later today Thanks for allowing pharmacy to be a part of this patient's care.  Talbert Cage, PharmD Clinical Pharmacist 09/17/2020 3:16 AM

## 2020-09-17 NOTE — Progress Notes (Signed)
ANTICOAGULATION CONSULT NOTE  Pharmacy Consult for Heparin/Warfarin Indication: Mechanical AVR  Patient Measurements: Height: 5\' 2"  (157.5 cm) Weight: 50.3 kg (110 lb 14.3 oz) IBW/kg (Calculated) : 50.1 Heparin Dosing Weight: 50.3 kg  Vital Signs: Temp: 97.5 F (36.4 C) (11/03 0831) Temp Source: Oral (11/03 0831) BP: 152/74 (11/03 0831) Pulse Rate: 67 (11/03 0831)  Labs: Recent Labs    09/15/20 0819 09/15/20 0819 09/15/20 1045 09/16/20 0141 09/16/20 1628 09/17/20 0113 09/17/20 1118 09/17/20 1828  HGB 8.0*   < >  --  8.1*  --  7.5* 8.9*  --   HCT 25.6*   < >  --  26.2*  --  24.2* 28.6*  --   PLT 160  --   --  155  --  150  --   --   LABPROT 16.2*  --   --  16.6*  --  15.9*  --   --   INR 1.4*  --   --  1.4*  --  1.3*  --   --   HEPARINUNFRC 0.43   < >  --  0.29*   < > 0.17* 0.47 0.35  CREATININE  --   --  1.32* 1.30*  --   --   --   --    < > = values in this interval not displayed.    Estimated Creatinine Clearance: 22.3 mL/min (A) (by C-G formula based on SCr of 1.3 mg/dL (H)).  Medications:  Scheduled:  . carvedilol  3.125 mg Oral BID WC  . donepezil  10 mg Oral Daily  . furosemide  40 mg Oral Daily  . pregabalin  75 mg Oral Daily  . spironolactone  12.5 mg Oral Daily  . umeclidinium-vilanterol  1 puff Inhalation Daily  . Warfarin - Pharmacist Dosing Inpatient   Does not apply q1600   Infusions:  . heparin 950 Units/hr (09/17/20 0325)  . lactated ringers 10 mL/hr at 09/13/20 1454    Assessment: 86 yr old female presented with abdominal pain, was found to have choledocholithiasis. Patient has mechanical AVR and was on warfarin PTA. Warfarin was put on hold and bridging with heparin infusion while awaiting surgical plans. GI notes decision to hold off on any surgery at this point. Pharmacy was consulted to resume warfarin 10/31.  PTA warfarin dose: 5 mg daily, except 2.5 mg MWF  Confirmatory heparin level ~7 hrs after initial therapeutic heparin level on  heparin infusion at 950 units/hr was 0.35 units/ml, which remains within the goal range for this pt, but is lower than earlier therapeutic level (0.47 units/ml). H/H 8.9/28.6, platelets 150. Per RN, no issues with IV; she said that day shift RN reported a small amt of blood on toilet tissue today (pt has hx of hemorrhoids) - will monitor closely.  INR is low today at 1.3; will give small boost with warfarin dose today (completed)  Goal of Therapy:  Heparin level 0.3-0.7 units/ml INR 2.2-3 per anticoag clinic note Monitor platelets by anticoagulation protocol: Yes   Plan:  Increase heparin infusion to 1000 units/hr Check 8-hr heparin level Give warfarin 7.5 mg PO x 1 today - completed Monitor daily heparin level, CBC and INR Monitor for signs/symptoms of bleeding  12-28-2001, PharmD, BCPS, [facility] Clinical Pharmacist 09/17/2020 7:37 PM

## 2020-09-17 NOTE — Progress Notes (Signed)
This wirter has been informed twice by family that pt had blood in her stools, assessed stools and noted the blood stain on the toilet papers. No drainage comin from the rectum  Area noted. Pt has hx of hemorrhoids. Attending MD aware of above concerns.No new orders at this time. [name]

## 2020-09-17 NOTE — Progress Notes (Signed)
ANTICOAGULATION CONSULT NOTE  Pharmacy Consult for heparin/warfarin Indication: Mechanical AVR  Patient Measurements: Height: 5\' 2"  (157.5 cm) Weight: 50.3 kg (110 lb 14.3 oz) IBW/kg (Calculated) : 50.1 Heparin Dosing Weight: 50.3 kg  Vital Signs: Temp: 97.5 F (36.4 C) (11/03 0831) Temp Source: Oral (11/03 0831) BP: 152/74 (11/03 0831) Pulse Rate: 67 (11/03 0831)  Labs: Recent Labs    09/15/20 0819 09/15/20 0819 09/15/20 1045 09/16/20 0141 09/16/20 0141 09/16/20 1628 09/17/20 0113 09/17/20 1118  HGB 8.0*   < >  --  8.1*   < >  --  7.5* 8.9*  HCT 25.6*   < >  --  26.2*  --   --  24.2* 28.6*  PLT 160  --   --  155  --   --  150  --   LABPROT 16.2*  --   --  16.6*  --   --  15.9*  --   INR 1.4*  --   --  1.4*  --   --  1.3*  --   HEPARINUNFRC 0.43   < >  --  0.29*   < > 0.25* 0.17* 0.47  CREATININE  --   --  1.32* 1.30*  --   --   --   --    < > = values in this interval not displayed.    Estimated Creatinine Clearance: 22.3 mL/min (A) (by C-G formula based on SCr of 1.3 mg/dL (H)).  Medications:  Scheduled:  . carvedilol  3.125 mg Oral BID WC  . donepezil  10 mg Oral Daily  . furosemide  40 mg Oral Daily  . pregabalin  75 mg Oral Daily  . spironolactone  12.5 mg Oral Daily  . umeclidinium-vilanterol  1 puff Inhalation Daily  . Warfarin - Pharmacist Dosing Inpatient   Does not apply q1600   Infusions:  . heparin 950 Units/hr (09/17/20 0325)  . lactated ringers 10 mL/hr at 09/13/20 1454    Assessment: 89 YOF presented with abdominal pain, found to have choledocholithiasis. Patient has a history of mechanical AVR on warfarin PTA. Warfarin was put on hold and bridging with heparin infusion while awaiting surgical plans. GI notes decision to hold off on any surgery at this point. Pharmacy was consulted to resume warfarin 10/31  Heparin level is therapeutic (0.47) and INR is low at 1.3. Will give small boost with warfarin dose today.   PTA warfarin dose: 5 mg  daily except 2.5 mg MWF  Goal of Therapy:  INR 2.2-3 per anticoag clinic note Monitor platelets by anticoagulation protocol: Yes   Plan:  Give warfarin 7.5mg  PO x 1 today Continue heparin gtt at 950 units/hr Confirmatory Heparin level in 8 hours Daily heparin level, CBC and INR  Kazimir Hartnett A. 11/31, PharmD, BCPS, FNKF Clinical Pharmacist [place] Please utilize Amion for appropriate phone number to reach the unit pharmacist [facility])   09/17/2020 12:29 PM

## 2020-09-17 NOTE — Progress Notes (Addendum)
PROGRESS NOTE    [name]  ONG:[id number] DOB: 12/20/1925 DOA: 09/08/2020 PCP: Oval Linsey, MD     Brief Narrative:  86 year old female with history of vitamin D deficiency, transient global amnesia, stroke, COPD on 4 L nasal cannula at home, left bundle branch block, hypertension, hyperlipidemia, CAD, chronic combined systolic and diastolic CHF, carotid artery stenosis, mechanical heart valve.  Patient presents with abdominal pain with concern for acute cholecystitis and choledocholithiasis.  She was seen by general surgery and gastroenterology.  No plans for cholecystectomy, GI recommended ERCP.  Patient was transferred to [facility] as anesthesiologist did not feel comfortable for patient to have procedure at [facility] hospital.  Patient underwent ERCP with stone removal.  Patient is started back on heparin and Coumadin, INR is still subtherapeutic.  Plan: discharge home once INR becomes therapeutic.   Assessment & Plan:   Active Problems:   Essential hypertension   LBBB (left bundle branch block)   S/P AVR (aortic valve replacement)   Chronic combined systolic and diastolic CHF (congestive heart failure) (HCC)   Choledocholithiasis   1. Choledocholithiasis- GI and general surgery consulted .  She does not have cholecystitis clinically.  Patient was started on ciprofloxacin, She underwent ERCP with extraction of CBD stone.  GI recommended cholecystectomy,  Patient has refused to undergo cholecystectomy.  General surgery and GI agrees with the plan and does not see any emergent need for cholecystectomy.  2. Mechanical aortic valve-  seen on transthoracic echo from 2018, patient is on Coumadin as outpatient.  Goal of INR 2.2-3.0 as per outpatient Coumadin clinic note.  Continue Coumadin and heparin for bridging, INR is 1.4.  Will stop heparin after INR is more than 2.2.  Pharmacy managing Coumadin dosing.  3. Hemoptysis- Patient developed hemoptysis on 11/1, likely in the setting  of pulmonary edema along with anticoagulation with heparin and Coumadin bridging.  Chest x-ray showed mild bibasilar atelectasis versus infiltrate.  She has been on ceftriaxone for past 8 days, She was also started on Zithromax on 09/15/20.  we doubt she has pneumonia, clinically she is stable.  We will continue with diuresis for pulmonary edema, and discontinue antibiotics. Hemoptysis  Resolved   4. Anemia-continue iron supplementation.  5. COPD-Patient chronically on home O2 at home.  Not in acute exacerbation.  6. Mild pulmonary edema- She developed mild pulmonary edema as she has not been getting her Lasix.  She was +3 L fluid overload, received IV Lasix 40 mg x 1 with good diuresis.  Patient started back on home dose of Lasix 40 mg daily.     7. Prolonged QTC-QTC was prolonged at the time of admission to 528.  Repeat EKG shows QTC 479.   Serum magnesium was checked which was 2.2.    8. Hyperlipidemia-continue Lipitor.  9. Acute kidney injury on CKD stage IIIb-  baseline creatinine around 1.2.  Creatinine is 1.32 today.   Follow BMP in am.  10. Dementia-continue Aricept.  11. Anxiety-continue Xanax as needed.  12. Hypertension-blood pressure is now mildly elevated, continue Aldactone, Coreg.     DVT prophylaxis: Coumadin/ Heparin Code Status: Full code. Family Communication: spoke with daughter at bed side. Disposition Plan:  Status is: Inpatient  Remains inpatient appropriate because:Inpatient level of care appropriate due to severity of illness   Dispo: The patient is from: Home              Anticipated d/c is to: Home  Anticipated d/c date is: 3 days              Patient currently is not medically stable to d/c.   Consultants:   General surgery, GI,   Procedures:  ERCP with stone removal. Antimicrobials:  Anti-infectives (From admission, onward)   Start     Dose/Rate Route Frequency Ordered Stop   09/15/20 1430  azithromycin (ZITHROMAX) tablet 500 mg   Status:  Discontinued        500 mg Oral Daily 09/15/20 1344 09/16/20 0949   09/12/20 0930  cefTRIAXone (ROCEPHIN) 1 g in sodium chloride 0.9 % 100 mL IVPB  Status:  Discontinued        1 g 200 mL/hr over 30 Minutes Intravenous Every 24 hours 09/12/20 0838 09/16/20 0949   09/12/20 0930  metroNIDAZOLE (FLAGYL) IVPB 500 mg  Status:  Discontinued        500 mg 100 mL/hr over 60 Minutes Intravenous Every 8 hours 09/12/20 0838 09/12/20 0847   09/10/20 0900  ciprofloxacin (CIPRO) IVPB 400 mg  Status:  Discontinued        400 mg 200 mL/hr over 60 Minutes Intravenous Every 24 hours 09/09/20 1041 09/12/20 0838   09/09/20 0800  ciprofloxacin (CIPRO) IVPB 400 mg  Status:  Discontinued        400 mg 200 mL/hr over 60 Minutes Intravenous Every 12 hours 09/08/20 1908 09/09/20 1041   09/08/20 1800  ciprofloxacin (CIPRO) IVPB 400 mg        400 mg 200 mL/hr over 60 Minutes Intravenous  Once 09/08/20 1745 09/08/20 2000      Subjective: Patient was seen and examined at bedside.  Overnight events noted.  Patient reports feeling better denies any nausea vomiting and diarrhea.  She is concerned why her INR is going down instead of going up. Daughter reports that she is having BRBR/ reports hemorrhoids,  Objective: Vitals:   09/16/20 1600 09/16/20 1940 09/17/20 0257 09/17/20 0831  BP: (!) 128/46 (!) 126/91 (!) 135/57 (!) 152/74  Pulse: 79 72 61 67  Resp: 20 20 18 15   Temp: 98.5 F (36.9 C) 98.1 F (36.7 C) 98.5 F (36.9 C) (!) 97.5 F (36.4 C)  TempSrc: Oral Oral Oral Oral  SpO2: 100% 100% 99% 99%  Weight:      Height:        Intake/Output Summary (Last 24 hours) at 09/17/2020 1439 Last data filed at 09/17/2020 0900 Gross per 24 hour  Intake 720 ml  Output 650 ml  Net 70 ml   Filed Weights   09/08/20 1227 09/13/20 1102  Weight: 50.3 kg 50.3 kg    Examination:  General exam: Appears calm and comfortable  Respiratory system: Clear to auscultation. Respiratory effort  normal. Cardiovascular system: S1 & S2 heard, RRR. No JVD, murmurs, rubs, gallops or clicks. No pedal edema. Gastrointestinal system: Abdomen is nondistended, soft and nontender. No organomegaly or masses felt. Normal bowel sounds heard. Central nervous system: Alert and oriented. No focal neurological deficits. Extremities:  No edema, no cyanosis, no clubbing. Skin: No rashes, lesions or ulcers Psychiatry: Judgement and insight appear normal. Mood & affect appropriate.     Data Reviewed: I have personally reviewed following labs and imaging studies  CBC: Recent Labs  Lab 09/12/20 2249 09/12/20 2249 09/14/20 0234 09/15/20 0819 09/16/20 0141 09/17/20 0113 09/17/20 1118  WBC 7.7  --  4.9 11.3* 8.4 6.8  --   HGB 8.7*   < > 8.7* 8.0* 8.1* 7.5* 8.9*  HCT 27.7*   < > 28.0* 25.6* 26.2* 24.2* 28.6*  MCV 98.9  --  97.6 97.0 97.8 100.4*  --   PLT 150  --  158 160 155 150  --    < > = values in this interval not displayed.   Basic Metabolic Panel: Recent Labs  Lab 09/11/20 1123 09/12/20 0402 09/13/20 1631 09/15/20 1045 09/16/20 0141  NA 142 142 143 143 142  K 3.9 3.9 4.3 4.1 3.7  CL 103 107 107 104 104  CO2 29 29 26 29 31   GLUCOSE 106* 113* 114* 87 101*  BUN 10 15 11 14 16   CREATININE 1.32* 1.46* 1.15* 1.32* 1.30*  CALCIUM 8.8* 8.6* 8.8* 8.7* 8.6*   GFR: Estimated Creatinine Clearance: 22.3 mL/min (A) (by C-G formula based on SCr of 1.3 mg/dL (H)). Liver Function Tests: Recent Labs  Lab 09/11/20 1123 09/12/20 0402  AST 27 22  ALT 18 15  ALKPHOS 57 53  BILITOT 0.8 0.7  PROT 5.8* 5.4*  ALBUMIN 3.2* 2.9*   No results for input(s): LIPASE, AMYLASE in the last 168 hours. No results for input(s): AMMONIA in the last 168 hours. Coagulation Profile: Recent Labs  Lab 09/12/20 2249 09/14/20 0234 09/15/20 0819 09/16/20 0141 09/17/20 0113  INR 1.6* 1.3* 1.4* 1.4* 1.3*   Cardiac Enzymes: No results for input(s): CKTOTAL, CKMB, CKMBINDEX, TROPONINI in the last 168  hours. BNP (last 3 results) No results for input(s): PROBNP in the last 8760 hours. HbA1C: No results for input(s): HGBA1C in the last 72 hours. CBG: No results for input(s): GLUCAP in the last 168 hours. Lipid Profile: No results for input(s): CHOL, HDL, LDLCALC, TRIG, CHOLHDL, LDLDIRECT in the last 72 hours. Thyroid Function Tests: No results for input(s): TSH, T4TOTAL, FREET4, T3FREE, THYROIDAB in the last 72 hours. Anemia Panel: No results for input(s): VITAMINB12, FOLATE, FERRITIN, TIBC, IRON, RETICCTPCT in the last 72 hours. Sepsis Labs: No results for input(s): PROCALCITON, LATICACIDVEN in the last 168 hours.  Recent Results (from the past 240 hour(s))  Respiratory Panel by RT PCR (Flu A&B, Covid) - Nasopharyngeal Swab     Status: None   Collection Time: 09/08/20  8:22 PM   Specimen: Nasopharyngeal Swab  Result Value Ref Range Status   SARS Coronavirus 2 by RT PCR NEGATIVE NEGATIVE Final    Comment: (NOTE) SARS-CoV-2 target nucleic acids are NOT DETECTED.  The SARS-CoV-2 RNA is generally detectable in upper respiratoy specimens during the acute phase of infection. The lowest concentration of SARS-CoV-2 viral copies this assay can detect is 131 copies/mL. A negative result does not preclude SARS-Cov-2 infection and should not be used as the sole basis for treatment or other patient management decisions. A negative result may occur with  improper specimen collection/handling, submission of specimen other than nasopharyngeal swab, presence of viral mutation(s) within the areas targeted by this assay, and inadequate number of viral copies (<131 copies/mL). A negative result must be combined with clinical observations, patient history, and epidemiological information. The expected result is Negative.  Fact Sheet for Patients:  https://www.moore.com/https://www.fda.gov/media/142436/download  Fact Sheet for Healthcare Providers:  https://www.young.biz/https://www.fda.gov/media/142435/download  This test is no t yet  approved or cleared by the [place] FDA and  has been authorized for detection and/or diagnosis of SARS-CoV-2 by FDA under an Emergency Use Authorization (EUA). This EUA will remain  in effect (meaning this test can be used) for the duration of the COVID-19 declaration under Section 564(b)(1) of the Act, 21 U.S.C. section 360bbb-3(b)(1), unless  the authorization is terminated or revoked sooner.     Influenza A by PCR NEGATIVE NEGATIVE Final   Influenza B by PCR NEGATIVE NEGATIVE Final    Comment: (NOTE) The Xpert Xpress SARS-CoV-2/FLU/RSV assay is intended as an aid in  the diagnosis of influenza from Nasopharyngeal swab specimens and  should not be used as a sole basis for treatment. Nasal washings and  aspirates are unacceptable for Xpert Xpress SARS-CoV-2/FLU/RSV  testing.  Fact Sheet for Patients: https://www.moore.com/  Fact Sheet for Healthcare Providers: https://www.young.biz/  This test is not yet approved or cleared by the [place] FDA and  has been authorized for detection and/or diagnosis of SARS-CoV-2 by  FDA under an Emergency Use Authorization (EUA). This EUA will remain  in effect (meaning this test can be used) for the duration of the  Covid-19 declaration under Section 564(b)(1) of the Act, 21  U.S.C. section 360bbb-3(b)(1), unless the authorization is  terminated or revoked. Performed at [facility], [address], [place], [place] [address]      Radiology Studies: No results found.  Scheduled Meds: . carvedilol  3.125 mg Oral BID WC  . donepezil  10 mg Oral Daily  . furosemide  40 mg Oral Daily  . pregabalin  75 mg Oral Daily  . spironolactone  12.5 mg Oral Daily  . umeclidinium-vilanterol  1 puff Inhalation Daily  . warfarin  7.5 mg Oral ONCE-1600  . Warfarin - Pharmacist Dosing Inpatient   Does not apply q1600   Continuous Infusions: . heparin 950 Units/hr (09/17/20 0325)  . lactated ringers 10  mL/hr at 09/13/20 1454     LOS: 9 days    Time spent: 35 mins    Tamberlyn Midgley, MD [facility]   If 7PM-7AM, please contact night-coverage

## 2020-09-17 NOTE — Progress Notes (Signed)
Physical Therapy Treatment Patient Details Name: [name] MRN: [id number] DOB: 1926/09/16 Today's Date: 09/17/2020    History of Present Illness 86 year old female with history of vitamin D deficiency, transient global amnesia, stroke, COPD on 4 L nasal cannula at home, left bundle branch block, hypertension, hyperlipidemia, CAD, chronic combined systolic and diastolic CHF, carotid artery stenosis, mechanical heart valve.  Patient presents with abdominal pain with concern for acute cholecystitis and choledocholithiasis. No plans for cholecystectomy. Patient underwent ERCP on 10/30.     PT Comments    Pt making good progress toward mobility goals, able to stand from bed and chair heights with Supervision and able to progress gait distance using RW with min guard assist and chair follow to 139ft. Pt performed stair trial x2 steps with min guard and good safety awareness, no LOB. Pt with good safety awareness, needs occasional cues for navigation due to multiple lines, vitals stable on 4L O2 [place] during gait. Pt pulling 1000-1250 on IS. Pt reports moderate fatigue (5/10 modified RPE) at end of session. Will plan to progress stair/gait training and focus on improving BLE strength and activity tolerance next session. Pt continues to benefit from PT services to progress toward functional mobility goals. D/C recs below remain appropriate.  Follow Up Recommendations  Home health PT;Supervision/Assistance - 24 hour     Equipment Recommendations  Other (comment);None recommended by PT (pt owns necessary DME)    Recommendations for Other Services       Precautions / Restrictions Precautions Precautions: Fall Restrictions Weight Bearing Restrictions: No    Mobility  Bed Mobility Overal bed mobility: Needs Assistance Bed Mobility: Supine to Sit     Supine to sit: Supervision        Transfers Overall transfer level: Needs assistance Equipment used: Rolling walker (2  wheeled) Transfers: Sit to/from Stand Sit to Stand: Supervision         General transfer comment: from EOB and chair heights, Supervision, needs occasional cues for safe UE placement but fairly good safety awareness  Ambulation/Gait Ambulation/Gait assistance: Min guard Gait Distance (Feet): 115 Feet (14ft, seated break, 167ft) Assistive device: Rolling walker (2 wheeled) Gait Pattern/deviations: Step-through pattern;Decreased stride length;Wide base of support Gait velocity: grossly decreased   General Gait Details: Pt needs frequent cues for body orientation with RW, especially during turns; forward head/downward gaze despite cues (kyphotic posture), needs some safety cues for line mgmt/awareness due to multiple lines.   Stairs Stairs: Yes Stairs assistance: Min guard Stair Management: One rail Right Number of Stairs: 2 (ascended two 7" steps and descended three 4" steps in PT gym) General stair comments: step-to gait pattern, pt able to recall sequencing and visual demo prior to performing; pt with good safety awareness and no LOB   Wheelchair Mobility    Modified Rankin (Stroke Patients Only)       Balance Overall balance assessment: Needs assistance Sitting-balance support: No upper extremity supported;Feet supported Sitting balance-Leahy Scale: Good     Standing balance support: Single extremity supported;During functional activity Standing balance-Leahy Scale: Poor Standing balance comment: pt fairly steady with BUE support of RW but anterior lean/downward gaze increases with U UE support, needs RW for safety with all mobility tasks                            Cognition Arousal/Alertness: Awake/alert Behavior During Therapy: WFL for tasks assessed/performed Overall Cognitive Status: Within Functional Limits for tasks assessed  General Comments: no deficits observed aside from needing occasional cues for  sequencing, pt HoH      Exercises      General Comments General comments (skin integrity, edema, etc.): pt SpO2 WNL on 4L O2 [place] during mobility tasks (98-100%) and remained up in chair at end of session, daughter reports she will be present while pt is in chair. Chair alarm mat obtained for room and will plan to place next session if daughter is not present in room.      Pertinent Vitals/Pain Pain Assessment: No/denies pain Pain Intervention(s): Monitored during session    Home Living                      Prior Function            PT Goals (current goals can now be found in the care plan section) Acute Rehab PT Goals Patient Stated Goal: to go home PT Goal Formulation: With patient/family Time For Goal Achievement: 09/29/20 Potential to Achieve Goals: Good Progress towards PT goals: Progressing toward goals    Frequency    Min 3X/week      PT Plan Current plan remains appropriate    Co-evaluation              AM-PAC PT "6 Clicks" Mobility   Outcome Measure  Help needed turning from your back to your side while in a flat bed without using bedrails?: None Help needed moving from lying on your back to sitting on the side of a flat bed without using bedrails?: None Help needed moving to and from a bed to a chair (including a wheelchair)?: A Little Help needed standing up from a chair using your arms (e.g., wheelchair or bedside chair)?: None Help needed to walk in hospital room?: A Little Help needed climbing 3-5 steps with a railing? : A Little 6 Click Score: 21    End of Session Equipment Utilized During Treatment: Gait belt;Oxygen Activity Tolerance: Patient tolerated treatment well Patient left: in chair;with call bell/phone within reach;with family/visitor present Nurse Communication: Mobility status PT Visit Diagnosis: Unsteadiness on feet (R26.81);Other abnormalities of gait and mobility (R26.89);Muscle weakness (generalized) (M62.81)      Time: 5956-3875 PT Time Calculation (min) (ACUTE ONLY): 35 min  Charges:  $Gait Training: 23-37 mins                     Christal Lagerstrom P., PTA Acute Rehabilitation Services Pager: [phone] Office: [phone]   [place] 09/17/2020, 2:15 PM

## 2020-09-18 LAB — COMPREHENSIVE METABOLIC PANEL
ALT: 14 U/L (ref 0–44)
AST: 20 U/L (ref 15–41)
Albumin: 2.6 g/dL — ABNORMAL LOW (ref 3.5–5.0)
Alkaline Phosphatase: 46 U/L (ref 38–126)
Anion gap: 8 (ref 5–15)
BUN: 16 mg/dL (ref 8–23)
CO2: 31 mmol/L (ref 22–32)
Calcium: 8.9 mg/dL (ref 8.9–10.3)
Chloride: 104 mmol/L (ref 98–111)
Creatinine, Ser: 1.24 mg/dL — ABNORMAL HIGH (ref 0.44–1.00)
GFR, Estimated: 41 mL/min — ABNORMAL LOW (ref >60)
Glucose, Bld: 89 mg/dL (ref 70–99)
Potassium: 3.7 mmol/L (ref 3.5–5.1)
Sodium: 143 mmol/L (ref 135–145)
Total Bilirubin: 0.6 mg/dL (ref 0.3–1.2)
Total Protein: 5.3 g/dL — ABNORMAL LOW (ref 6.5–8.1)

## 2020-09-18 LAB — PROTIME-INR
INR: 1.5 — ABNORMAL HIGH (ref 0.8–1.2)
Prothrombin Time: 17.4 seconds — ABNORMAL HIGH (ref 11.4–15.2)

## 2020-09-18 LAB — CBC
HCT: 24.3 % — ABNORMAL LOW (ref 36.0–46.0)
Hemoglobin: 7.7 g/dL — ABNORMAL LOW (ref 12.0–15.0)
MCH: 31 pg (ref 26.0–34.0)
MCHC: 31.7 g/dL (ref 30.0–36.0)
MCV: 98 fL (ref 80.0–100.0)
Platelets: 188 10*3/uL (ref 150–400)
RBC: 2.48 MIL/uL — ABNORMAL LOW (ref 3.87–5.11)
RDW: 13.2 % (ref 11.5–15.5)
WBC: 7 10*3/uL (ref 4.0–10.5)
nRBC: 0 % (ref 0.0–0.2)

## 2020-09-18 LAB — HEMOGLOBIN AND HEMATOCRIT, BLOOD
HCT: 27.3 % — ABNORMAL LOW (ref 36.0–46.0)
Hemoglobin: 8.5 g/dL — ABNORMAL LOW (ref 12.0–15.0)

## 2020-09-18 LAB — PHOSPHORUS: Phosphorus: 3.3 mg/dL (ref 2.5–4.6)

## 2020-09-18 LAB — MAGNESIUM: Magnesium: 2 mg/dL (ref 1.7–2.4)

## 2020-09-18 LAB — HEPARIN LEVEL (UNFRACTIONATED): Heparin Unfractionated: 0.44 IU/mL (ref 0.30–0.70)

## 2020-09-18 MED ORDER — WARFARIN SODIUM 5 MG PO TABS
5.0000 mg | ORAL_TABLET | Freq: Once | ORAL | Status: AC
  Administered 2020-09-18: 5 mg via ORAL
  Filled 2020-09-18: qty 1

## 2020-09-18 NOTE — Plan of Care (Signed)

## 2020-09-18 NOTE — Plan of Care (Signed)

## 2020-09-18 NOTE — Progress Notes (Signed)
PROGRESS NOTE    Josephine CablesRuth E [name]  ZOX:[id number] DOB: [birth year] DOA: 09/08/2020 PCP: Oval Linseyondiego, Richard, MD     Brief Narrative:  86 year old female with history of vitamin D deficiency, transient global amnesia, stroke, COPD on 4 L nasal cannula at home, left bundle branch block, hypertension, hyperlipidemia, CAD, chronic combined systolic and diastolic CHF, carotid artery stenosis, mechanical heart valve.  Patient presents with abdominal pain with concern for acute cholecystitis and choledocholithiasis.  She was seen by general surgery and gastroenterology.  No plans for cholecystectomy, GI recommended ERCP.  Patient was transferred to [facility] as anesthesiologist did not feel comfortable for patient to have procedure at [facility].  Patient underwent ERCP with stone removal.  Patient is started back on heparin and Coumadin, INR is still subtherapeutic.  Plan: discharge home once INR becomes therapeutic.   Assessment & Plan:   Active Problems:   Essential hypertension   LBBB (left bundle branch block)   S/P AVR (aortic valve replacement)   Chronic combined systolic and diastolic CHF (congestive heart failure) (HCC)   Choledocholithiasis   1. Choledocholithiasis- GI and general surgery consulted .  She does not have cholecystitis clinically.  Patient was started on ciprofloxacin, She underwent ERCP with extraction of CBD stone.  GI recommended cholecystectomy,  Patient has refused to undergo cholecystectomy.  General surgery and GI agrees with the plan and does not see any emergent need for cholecystectomy.  2. Mechanical aortic valve-  seen on transthoracic echo from 2018, patient is on Coumadin as outpatient.  Goal of INR 2.2-3.0 as per outpatient Coumadin clinic note.  Continue Coumadin and heparin for bridging, INR is 1.5.  Will stop heparin after INR is more than 2.2.  Pharmacy managing Coumadin dosing.  3. Hemoptysis- Resolved  Patient developed hemoptysis on 11/1, likely in  the setting of pulmonary edema along with anticoagulation with heparin and Coumadin bridging.  Chest x-ray showed mild bibasilar atelectasis versus infiltrate.  She has been on ceftriaxone for past 8 days, She was also started on Zithromax on 09/15/20.  we doubt she has pneumonia, clinically she is stable.  We will continue with diuresis for pulmonary edema, and discontinue antibiotics. Hemoptysis  Resolved   4. Anemia-continue iron supplementation.  5. COPD-Patient chronically on home O2 at home.  Not in acute exacerbation.  6. Mild pulmonary edema- She developed mild pulmonary edema as she has not been getting her Lasix.  She was +3 L fluid overload, received IV Lasix 40 mg x 1 with good diuresis.  Patient started back on home dose of Lasix 40 mg daily.     7. Prolonged QTC-QTC was prolonged at the time of admission to 528.  Repeat EKG shows QTC 479.   Serum magnesium was checked which was 2.2.    8. Hyperlipidemia-continue Lipitor.  9. Acute kidney injury on CKD stage IIIb-  baseline creatinine around 1.2.  Creatinine is 1.24 today.   Follow BMP in am.  10. Dementia-continue Aricept.  11. Anxiety-continue Xanax as needed.  12. Hypertension-blood pressure is mildly elevated, continue Aldactone, Coreg.     DVT prophylaxis: Coumadin/ Heparin Code Status: Full code. Family Communication: spoke with daughter at bed side. Disposition Plan:  Status is: Inpatient  Remains inpatient appropriate because:Inpatient level of care appropriate due to severity of illness   Dispo: The patient is from: Home              Anticipated d/c is to: Home  Anticipated d/c date is: 3 days              Patient currently is not medically stable to d/c.   Consultants:   General surgery, GI,   Procedures:  ERCP with stone removal. Antimicrobials:  Anti-infectives (From admission, onward)   Start     Dose/Rate Route Frequency Ordered Stop   09/15/20 1430  azithromycin (ZITHROMAX)  tablet 500 mg  Status:  Discontinued        500 mg Oral Daily 09/15/20 1344 09/16/20 0949   09/12/20 0930  cefTRIAXone (ROCEPHIN) 1 g in sodium chloride 0.9 % 100 mL IVPB  Status:  Discontinued        1 g 200 mL/hr over 30 Minutes Intravenous Every 24 hours 09/12/20 0838 09/16/20 0949   09/12/20 0930  metroNIDAZOLE (FLAGYL) IVPB 500 mg  Status:  Discontinued        500 mg 100 mL/hr over 60 Minutes Intravenous Every 8 hours 09/12/20 0838 09/12/20 0847   09/10/20 0900  ciprofloxacin (CIPRO) IVPB 400 mg  Status:  Discontinued        400 mg 200 mL/hr over 60 Minutes Intravenous Every 24 hours 09/09/20 1041 09/12/20 0838   09/09/20 0800  ciprofloxacin (CIPRO) IVPB 400 mg  Status:  Discontinued        400 mg 200 mL/hr over 60 Minutes Intravenous Every 12 hours 09/08/20 1908 09/09/20 1041   09/08/20 1800  ciprofloxacin (CIPRO) IVPB 400 mg        400 mg 200 mL/hr over 60 Minutes Intravenous  Once 09/08/20 1745 09/08/20 2000      Subjective: Patient was seen and examined at bedside.  Overnight events noted.  Patient reports feeling better,  denies any nausea, vomiting and diarrhea.   She still has blood on the tissue paper,  states happens every 2 to 3 months for few days.  She denies any hemoptysis.  Objective: Vitals:   09/17/20 1943 09/18/20 0356 09/18/20 0816 09/18/20 0828  BP: (!) 144/58 (!) 141/64  (!) 142/49  Pulse: 67 63  66  Resp: 17 16  20   Temp: 98.5 F (36.9 C) 97.9 F (36.6 C)  98 F (36.7 C)  TempSrc: Oral Oral  Oral  SpO2: 98% 99% 98% 100%  Weight:      Height:        Intake/Output Summary (Last 24 hours) at 09/18/2020 1455 Last data filed at 09/18/2020 1235 Gross per 24 hour  Intake 960 ml  Output 1000 ml  Net -40 ml   Filed Weights   09/08/20 1227 09/13/20 1102  Weight: 50.3 kg 50.3 kg    Examination:  General exam: Appears calm and comfortable  Respiratory system: Clear to auscultation. Respiratory effort normal. Cardiovascular system: S1 & S2 heard,  RRR. No JVD, murmurs, rubs, gallops or clicks. No pedal edema. Gastrointestinal system: Abdomen is nondistended, soft and nontender. No organomegaly or masses felt. Normal bowel sounds heard. Central nervous system: Alert and oriented. No focal neurological deficits. Extremities:  No edema, no cyanosis, no clubbing. Skin: No rashes, lesions or ulcers Psychiatry: Judgement and insight appear normal. Mood & affect appropriate.     Data Reviewed: I have personally reviewed following labs and imaging studies  CBC: Recent Labs  Lab 09/14/20 0234 09/14/20 0234 09/15/20 0819 09/15/20 0819 09/16/20 0141 09/17/20 0113 09/17/20 1118 09/18/20 0417 09/18/20 1157  WBC 4.9  --  11.3*  --  8.4 6.8  --  7.0  --   HGB 8.7*   < >  8.0*   < > 8.1* 7.5* 8.9* 7.7* 8.5*  HCT 28.0*   < > 25.6*   < > 26.2* 24.2* 28.6* 24.3* 27.3*  MCV 97.6  --  97.0  --  97.8 100.4*  --  98.0  --   PLT 158  --  160  --  155 150  --  188  --    < > = values in this interval not displayed.   Basic Metabolic Panel: Recent Labs  Lab 09/12/20 0402 09/13/20 1631 09/15/20 1045 09/16/20 0141 09/18/20 0417  NA 142 143 143 142 143  K 3.9 4.3 4.1 3.7 3.7  CL 107 107 104 104 104  CO2 29 26 29 31 31   GLUCOSE 113* 114* 87 101* 89  BUN 15 11 14 16 16   CREATININE 1.46* 1.15* 1.32* 1.30* 1.24*  CALCIUM 8.6* 8.8* 8.7* 8.6* 8.9  MG  --   --   --   --  2.0  PHOS  --   --   --   --  3.3   GFR: Estimated Creatinine Clearance: 23.4 mL/min (A) (by C-G formula based on SCr of 1.24 mg/dL (H)). Liver Function Tests: Recent Labs  Lab 09/12/20 0402 09/18/20 0417  AST 22 20  ALT 15 14  ALKPHOS 53 46  BILITOT 0.7 0.6  PROT 5.4* 5.3*  ALBUMIN 2.9* 2.6*   No results for input(s): LIPASE, AMYLASE in the last 168 hours. No results for input(s): AMMONIA in the last 168 hours. Coagulation Profile: Recent Labs  Lab 09/14/20 0234 09/15/20 0819 09/16/20 0141 09/17/20 0113 09/18/20 0417  INR 1.3* 1.4* 1.4* 1.3* 1.5*    Cardiac Enzymes: No results for input(s): CKTOTAL, CKMB, CKMBINDEX, TROPONINI in the last 168 hours. BNP (last 3 results) No results for input(s): PROBNP in the last 8760 hours. HbA1C: No results for input(s): HGBA1C in the last 72 hours. CBG: No results for input(s): GLUCAP in the last 168 hours. Lipid Profile: No results for input(s): CHOL, HDL, LDLCALC, TRIG, CHOLHDL, LDLDIRECT in the last 72 hours. Thyroid Function Tests: No results for input(s): TSH, T4TOTAL, FREET4, T3FREE, THYROIDAB in the last 72 hours. Anemia Panel: No results for input(s): VITAMINB12, FOLATE, FERRITIN, TIBC, IRON, RETICCTPCT in the last 72 hours. Sepsis Labs: No results for input(s): PROCALCITON, LATICACIDVEN in the last 168 hours.  Recent Results (from the past 240 hour(s))  Respiratory Panel by RT PCR (Flu A&B, Covid) - Nasopharyngeal Swab     Status: None   Collection Time: 09/08/20  8:22 PM   Specimen: Nasopharyngeal Swab  Result Value Ref Range Status   SARS Coronavirus 2 by RT PCR NEGATIVE NEGATIVE Final    Comment: (NOTE) SARS-CoV-2 target nucleic acids are NOT DETECTED.  The SARS-CoV-2 RNA is generally detectable in upper respiratoy specimens during the acute phase of infection. The lowest concentration of SARS-CoV-2 viral copies this assay can detect is 131 copies/mL. A negative result does not preclude SARS-Cov-2 infection and should not be used as the sole basis for treatment or other patient management decisions. A negative result may occur with  improper specimen collection/handling, submission of specimen other than nasopharyngeal swab, presence of viral mutation(s) within the areas targeted by this assay, and inadequate number of viral copies (<131 copies/mL). A negative result must be combined with clinical observations, patient history, and epidemiological information. The expected result is Negative.  Fact Sheet for Patients:  13/04/21  Fact  Sheet for Healthcare Providers:  09/10/20  This test is no t yet  approved or cleared by the [place] and  has been authorized for detection and/or diagnosis of SARS-CoV-2 by FDA under an Emergency Use Authorization (EUA). This EUA will remain  in effect (meaning this test can be used) for the duration of the COVID-19 declaration under Section 564(b)(1) of the Act, 21 U.S.C. section 360bbb-3(b)(1), unless the authorization is terminated or revoked sooner.     Influenza A by PCR NEGATIVE NEGATIVE Final   Influenza B by PCR NEGATIVE NEGATIVE Final    Comment: (NOTE) The Xpert Xpress SARS-CoV-2/FLU/RSV assay is intended as an aid in  the diagnosis of influenza from Nasopharyngeal swab specimens and  should not be used as a sole basis for treatment. Nasal washings and  aspirates are unacceptable for Xpert Xpress SARS-CoV-2/FLU/RSV  testing.  Fact Sheet for Patients: https://www.moore.com/  Fact Sheet for Healthcare Providers: https://www.young.biz/  This test is not yet approved or cleared by the [place] FDA and  has been authorized for detection and/or diagnosis of SARS-CoV-2 by  FDA under an Emergency Use Authorization (EUA). This EUA will remain  in effect (meaning this test can be used) for the duration of the  Covid-19 declaration under Section 564(b)(1) of the Act, 21  U.S.C. section 360bbb-3(b)(1), unless the authorization is  terminated or revoked. Performed at [facility], [address] [place], [place] [address]      Radiology Studies: No results found.  Scheduled Meds: . carvedilol  3.125 mg Oral BID WC  . donepezil  10 mg Oral Daily  . furosemide  40 mg Oral Daily  . pregabalin  75 mg Oral Daily  . spironolactone  12.5 mg Oral Daily  . umeclidinium-vilanterol  1 puff Inhalation Daily  . warfarin  5 mg Oral ONCE-1600  . Warfarin - Pharmacist Dosing Inpatient   Does not apply  q1600   Continuous Infusions: . heparin 1,000 Units/hr (09/18/20 0744)  . lactated ringers 10 mL/hr at 09/13/20 1454     LOS: 10 days    Time spent: 25 mins    Melvinia Ashby, MD [facility]   If 7PM-7AM, please contact night-coverage

## 2020-09-18 NOTE — Progress Notes (Signed)
ANTICOAGULATION CONSULT NOTE  Pharmacy Consult for Heparin/Warfarin Indication: Mechanical AVR  Patient Measurements: Height: 5\' 2"  (157.5 cm) Weight: 50.3 kg (110 lb 14.3 oz) IBW/kg (Calculated) : 50.1 Heparin Dosing Weight: 50.3 kg  Vital Signs: Temp: 97.9 F (36.6 C) (11/04 0356) Temp Source: Oral (11/04 0356) BP: 141/64 (11/04 0356) Pulse Rate: 63 (11/04 0356)  Labs: Recent Labs     0000 09/15/20 1045 09/16/20 0141 09/16/20 1628 09/17/20 0113 09/17/20 0113 09/17/20 1118 09/17/20 1828 09/18/20 0417  HGB   < >  --  8.1*  --  7.5*   < > 8.9*  --  7.7*  HCT   < >  --  26.2*  --  24.2*  --  28.6*  --  24.3*  PLT  --   --  155  --  150  --   --   --  188  LABPROT  --   --  16.6*  --  15.9*  --   --   --  17.4*  INR  --   --  1.4*  --  1.3*  --   --   --  1.5*  HEPARINUNFRC  --   --  0.29*   < > 0.17*   < > 0.47 0.35 0.44  CREATININE  --  1.32* 1.30*  --   --   --   --   --  1.24*   < > = values in this interval not displayed.    Estimated Creatinine Clearance: 23.4 mL/min (A) (by C-G formula based on SCr of 1.24 mg/dL (H)).  Medications:  Scheduled:  . carvedilol  3.125 mg Oral BID WC  . donepezil  10 mg Oral Daily  . furosemide  40 mg Oral Daily  . pregabalin  75 mg Oral Daily  . spironolactone  12.5 mg Oral Daily  . umeclidinium-vilanterol  1 puff Inhalation Daily  . Warfarin - Pharmacist Dosing Inpatient   Does not apply q1600   Infusions:  . heparin 1,000 Units/hr (09/18/20 0744)  . lactated ringers 10 mL/hr at 09/13/20 1454    Assessment: 86 yr old female presented with abdominal pain, was found to have choledocholithiasis. Patient has mechanical AVR and was on warfarin PTA. Warfarin was put on hold and bridging with heparin infusion while awaiting surgical plans. GI notes decision to hold off on any surgery at this point. Pharmacy was consulted to resume warfarin 10/31.  PTA warfarin dose: 5 mg daily, except 2.5 mg MWF  Heparin level from this am  remains therapeutic will monitor with daily levels   INR is up today at 1.5 from 1.3 (still subtherapeutic  Goal of Therapy:  Heparin level 0.3-0.7 units/ml INR 2.2-3 per anticoag clinic note Monitor platelets by anticoagulation protocol: Yes   Plan:  Continue heparin infusion at 1000 units/hr Give warfarin 5 mg PO x 1 today Monitor daily heparin level, CBC and INR Monitor for signs/symptoms of bleeding  11/31, PharmD, [facility] Clinical Pharmacist Please see AMION for all Pharmacists' Contact Phone Numbers 09/18/2020, 8:26 AM

## 2020-09-18 NOTE — Progress Notes (Signed)
ANTICOAGULATION CONSULT NOTE  Pharmacy Consult for Heparin Indication: Mechanical AVR  Patient Measurements: Height: 5\' 2"  (157.5 cm) Weight: 50.3 kg (110 lb 14.3 oz) IBW/kg (Calculated) : 50.1 Heparin Dosing Weight: 50.3 kg  Vital Signs: Temp: 97.9 F (36.6 C) (11/04 0356) Temp Source: Oral (11/04 0356) BP: 141/64 (11/04 0356) Pulse Rate: 63 (11/04 0356)  Labs: Recent Labs    09/15/20 0819 09/15/20 1045 09/16/20 0141 09/16/20 1628 09/17/20 0113 09/17/20 0113 09/17/20 1118 09/17/20 1828 09/18/20 0417  HGB   < >  --  8.1*  --  7.5*   < > 8.9*  --  7.7*  HCT   < >  --  26.2*  --  24.2*  --  28.6*  --  24.3*  PLT   < >  --  155  --  150  --   --   --  188  LABPROT   < >  --  16.6*  --  15.9*  --   --   --  17.4*  INR   < >  --  1.4*  --  1.3*  --   --   --  1.5*  HEPARINUNFRC   < >  --  0.29*   < > 0.17*   < > 0.47 0.35 0.44  CREATININE  --  1.32* 1.30*  --   --   --   --   --  1.24*   < > = values in this interval not displayed.    Estimated Creatinine Clearance: 23.4 mL/min (A) (by C-G formula based on SCr of 1.24 mg/dL (H)).  Medications:  Scheduled:  . carvedilol  3.125 mg Oral BID WC  . donepezil  10 mg Oral Daily  . furosemide  40 mg Oral Daily  . pregabalin  75 mg Oral Daily  . spironolactone  12.5 mg Oral Daily  . umeclidinium-vilanterol  1 puff Inhalation Daily  . Warfarin - Pharmacist Dosing Inpatient   Does not apply q1600   Infusions:  . heparin 1,000 Units/hr (09/17/20 2007)  . lactated ringers 10 mL/hr at 09/13/20 1454    Assessment: 86 yr old female presented with abdominal pain, was found to have choledocholithiasis. Patient has mechanical AVR and was on warfarin PTA. Warfarin was put on hold and bridging with heparin infusion while awaiting surgical plans. GI notes decision to hold off on any surgery at this point. Pharmacy was consulted to resume warfarin 10/31.  PTA warfarin dose: 5 mg daily, except 2.5 mg MWF  Confirmatory heparin level  ~7 hrs after initial therapeutic heparin level on heparin infusion at 950 units/hr was 0.35 units/ml, which remains within the goal range for this pt, but is lower than earlier therapeutic level (0.47 units/ml). H/H 8.9/28.6, platelets 150. Per RN, no issues with IV; she said that day shift RN reported a small amt of blood on toilet tissue today (pt has hx of hemorrhoids) - will monitor closely.  INR is low today at 1.3; will give small boost with warfarin dose today (completed)   11/4 AM update:  Heparin level remains therapeutic   Goal of Therapy:  Heparin level 0.3-0.7 units/ml INR 2.2-3 per anticoag clinic note Monitor platelets by anticoagulation protocol: Yes   Plan:  Cont heparin infusion at 1000 units/hr Daily CBC/HL Monitor for bleeding   13/4, PharmD, [facility] Clinical Pharmacist Phone: [phone]

## 2020-09-19 LAB — CBC
HCT: 23.8 % — ABNORMAL LOW (ref 36.0–46.0)
Hemoglobin: 7.3 g/dL — ABNORMAL LOW (ref 12.0–15.0)
MCH: 30.2 pg (ref 26.0–34.0)
MCHC: 30.7 g/dL (ref 30.0–36.0)
MCV: 98.3 fL (ref 80.0–100.0)
Platelets: 210 10*3/uL (ref 150–400)
RBC: 2.42 MIL/uL — ABNORMAL LOW (ref 3.87–5.11)
RDW: 13.3 % (ref 11.5–15.5)
WBC: 6.8 10*3/uL (ref 4.0–10.5)
nRBC: 0 % (ref 0.0–0.2)

## 2020-09-19 LAB — PREPARE RBC (CROSSMATCH)

## 2020-09-19 LAB — BASIC METABOLIC PANEL
Anion gap: 10 (ref 5–15)
BUN: 28 mg/dL — ABNORMAL HIGH (ref 8–23)
CO2: 30 mmol/L (ref 22–32)
Calcium: 9.3 mg/dL (ref 8.9–10.3)
Chloride: 102 mmol/L (ref 98–111)
Creatinine, Ser: 1.17 mg/dL — ABNORMAL HIGH (ref 0.44–1.00)
GFR, Estimated: 44 mL/min — ABNORMAL LOW (ref >60)
Glucose, Bld: 103 mg/dL — ABNORMAL HIGH (ref 70–99)
Potassium: 3.5 mmol/L (ref 3.5–5.1)
Sodium: 142 mmol/L (ref 135–145)

## 2020-09-19 LAB — PROTIME-INR
INR: 2 — ABNORMAL HIGH (ref 0.8–1.2)
Prothrombin Time: 21.6 seconds — ABNORMAL HIGH (ref 11.4–15.2)

## 2020-09-19 LAB — HEMOGLOBIN AND HEMATOCRIT, BLOOD
HCT: 22.4 % — ABNORMAL LOW (ref 36.0–46.0)
HCT: 26 % — ABNORMAL LOW (ref 36.0–46.0)
Hemoglobin: 7.1 g/dL — ABNORMAL LOW (ref 12.0–15.0)
Hemoglobin: 8.3 g/dL — ABNORMAL LOW (ref 12.0–15.0)

## 2020-09-19 LAB — HEPARIN LEVEL (UNFRACTIONATED): Heparin Unfractionated: 0.41 IU/mL (ref 0.30–0.70)

## 2020-09-19 MED ORDER — WARFARIN SODIUM 5 MG PO TABS
5.0000 mg | ORAL_TABLET | Freq: Once | ORAL | Status: AC
  Administered 2020-09-19: 5 mg via ORAL
  Filled 2020-09-19: qty 1

## 2020-09-19 MED ORDER — FUROSEMIDE 10 MG/ML IJ SOLN
40.0000 mg | Freq: Once | INTRAMUSCULAR | Status: AC
  Administered 2020-09-19: 40 mg via INTRAVENOUS
  Filled 2020-09-19: qty 4

## 2020-09-19 MED ORDER — SODIUM CHLORIDE 0.9% IV SOLUTION: Freq: Once | INTRAVENOUS | Status: AC

## 2020-09-19 NOTE — Plan of Care (Addendum)
Pt completed 1unit PRBC transfusion, vital signs taken and recorded, no adverse reactions noted.   Problem: Clinical Measurements: Goal: Ability to maintain clinical measurements within normal limits will improve Outcome: Progressing   Problem: Nutrition: Goal: Adequate nutrition will be maintained Outcome: Progressing   Problem: Coping: Goal: Level of anxiety will decrease Outcome: Progressing   Problem: Elimination: Goal: Will not experience complications related to bowel motility Outcome: Progressing   Problem: Pain Managment: Goal: General experience of comfort will improve Outcome: Progressing   Problem: Safety: Goal: Ability to remain free from injury will improve Outcome: Progressing   Problem: Skin Integrity: Goal: Risk for impaired skin integrity will decrease Outcome: Progressing

## 2020-09-19 NOTE — Care Management Important Message (Signed)
Important Message  Patient Details  Name: [name] MRN: [id number] Date of Birth: Nov 11, 1926   Medicare Important Message Given:  Yes     Evalina Tabak P Allyssa Abruzzese 09/19/2020, 1:37 PM

## 2020-09-19 NOTE — Progress Notes (Signed)
PROGRESS NOTE    DEVINA BEZOLD  TDH:[id number] DOB: 1926/10/01 DOA: 09/08/2020 PCP: Oval Linsey, MD     Brief Narrative:  86 year old female with history of vitamin D deficiency, transient global amnesia, stroke, COPD on 4 L nasal cannula at home, left bundle branch block, hypertension, hyperlipidemia, CAD, chronic combined systolic and diastolic CHF, carotid artery stenosis, mechanical heart valve.  Patient presents with abdominal pain with concern for acute cholecystitis and choledocholithiasis.  She was seen by general surgery and gastroenterology.  No plans for cholecystectomy, GI recommended ERCP.  Patient was transferred to [facility] as anesthesiologist did not feel comfortable for patient to have procedure at [facility] hospital.  Patient underwent ERCP with stone removal.  Patient is started back on heparin and Coumadin, INR is still subtherapeutic.  Plan: discharge home once INR becomes therapeutic.   Assessment & Plan:   Active Problems:   Essential hypertension   LBBB (left bundle branch block)   S/P AVR (aortic valve replacement)   Chronic combined systolic and diastolic CHF (congestive heart failure) (HCC)   Choledocholithiasis   1. Choledocholithiasis- GI and general surgery consulted .  She does not have cholecystitis clinically.  Patient was started on ciprofloxacin, She underwent ERCP with extraction of CBD stone.  GI recommended cholecystectomy,  Patient has refused to undergo cholecystectomy.  General surgery and GI agrees with the plan and does not see any emergent need for cholecystectomy.  2. Mechanical aortic valve-  seen on transthoracic echo from 2018, patient is on Coumadin as outpatient.  Goal of INR 2.2-3.0 as per outpatient Coumadin clinic note.  Continue Coumadin and heparin for bridging, INR is 2.0 today.  Will stop heparin after INR is more than 2.2.  Pharmacy managing Coumadin dosing.  3. Hemoptysis- Resolved  Patient developed hemoptysis on 11/1,  likely in the setting of pulmonary edema along with anticoagulation with heparin and Coumadin bridging.  Chest x-ray showed mild bibasilar atelectasis versus infiltrate.  She has been on ceftriaxone for past 8 days, She was also started on Zithromax on 09/15/20.  we doubt she has pneumonia, clinically she is stable.  We will continue with diuresis for pulmonary edema, and discontinue antibiotics. Hemoptysis  Resolved   4. Anemia-Continue iron supplementation. Hb dropped to 7.1, will transfuse 1 prbc, f/u Hb  5. COPD-Patient chronically on home O2 at home.  Not in acute exacerbation.  6. Mild pulmonary edema- She developed mild pulmonary edema as she has not been getting her Lasix.  She was +3 L fluid overload, received IV Lasix 40 mg x 1 with good diuresis.  Patient started back on home dose of Lasix 40 mg daily.     7. Prolonged QTC-QTC was prolonged at the time of admission to 528.  Repeat EKG shows QTC 479.   Serum magnesium was checked which was 2.2.    8. Hyperlipidemia-continue Lipitor.  9. Acute kidney injury on CKD stage IIIb-  baseline creatinine around 1.2.  Creatinine is 1.17 today.   Follow BMP in am.  10. Dementia-continue Aricept.  11. Anxiety-continue Xanax as needed.  12. Hypertension-blood pressure is mildly elevated, continue Aldactone, Coreg.     DVT prophylaxis: Coumadin/ Heparin Code Status: Full code. Family Communication: spoke with daughter at bed side. Disposition Plan:  Status is: Inpatient  Remains inpatient appropriate because:Inpatient level of care appropriate due to severity of illness   Dispo: The patient is from: Home              Anticipated d/c  is to: Home              Anticipated d/c date is: 3 days              Patient currently is not medically stable to d/c.   Consultants:   General surgery, GI,   Procedures:  ERCP with stone removal. Antimicrobials:  Anti-infectives (From admission, onward)   Start     Dose/Rate Route Frequency  Ordered Stop   09/15/20 1430  azithromycin (ZITHROMAX) tablet 500 mg  Status:  Discontinued        500 mg Oral Daily 09/15/20 1344 09/16/20 0949   09/12/20 0930  cefTRIAXone (ROCEPHIN) 1 g in sodium chloride 0.9 % 100 mL IVPB  Status:  Discontinued        1 g 200 mL/hr over 30 Minutes Intravenous Every 24 hours 09/12/20 0838 09/16/20 0949   09/12/20 0930  metroNIDAZOLE (FLAGYL) IVPB 500 mg  Status:  Discontinued        500 mg 100 mL/hr over 60 Minutes Intravenous Every 8 hours 09/12/20 0838 09/12/20 0847   09/10/20 0900  ciprofloxacin (CIPRO) IVPB 400 mg  Status:  Discontinued        400 mg 200 mL/hr over 60 Minutes Intravenous Every 24 hours 09/09/20 1041 09/12/20 0838   09/09/20 0800  ciprofloxacin (CIPRO) IVPB 400 mg  Status:  Discontinued        400 mg 200 mL/hr over 60 Minutes Intravenous Every 12 hours 09/08/20 1908 09/09/20 1041   09/08/20 1800  ciprofloxacin (CIPRO) IVPB 400 mg        400 mg 200 mL/hr over 60 Minutes Intravenous  Once 09/08/20 1745 09/08/20 2000      Subjective: Patient was seen and examined at bedside.  Overnight events noted.  Patient reports feeling shortness of breath, noted to have bibasilar crackles, given Lasix IV 40mg  x 1, reports better afterwards. She denies any chest pain, palpitations.  Objective: Vitals:   09/18/20 2100 09/19/20 0434 09/19/20 0726 09/19/20 0805  BP: (!) 158/53 (!) 146/47  (!) 140/47  Pulse: 70 76 76 72  Resp: 17 17 16 18   Temp: 98.5 F (36.9 C) 98.1 F (36.7 C)  98.3 F (36.8 C)  TempSrc: Oral Oral  Oral  SpO2: 100% 99% 98% 99%  Weight:      Height:        Intake/Output Summary (Last 24 hours) at 09/19/2020 1343 Last data filed at 09/19/2020 0530 Gross per 24 hour  Intake 907.25 ml  Output 1200 ml  Net -292.75 ml   Filed Weights   09/08/20 1227 09/13/20 1102  Weight: 50.3 kg 50.3 kg    Examination:  General exam: Appears calm and comfortable. Respiratory system: Bibasilar crackles on auscultation. Respiratory  effort normal. Cardiovascular system: S1 & S2 heard, RRR. No JVD, murmurs, rubs, gallops or clicks. No pedal edema. Gastrointestinal system: Abdomen is nondistended, soft and nontender. No organomegaly or masses felt. Normal bowel sounds heard. Central nervous system: Alert and oriented. No focal neurological deficits. Extremities:  No edema, no cyanosis, no clubbing. Skin: No rashes, lesions or ulcers Psychiatry: Judgement and insight appear normal. Mood & affect appropriate.     Data Reviewed: I have personally reviewed following labs and imaging studies  CBC: Recent Labs  Lab 09/15/20 0819 09/15/20 0819 09/16/20 0141 09/16/20 0141 09/17/20 0113 09/17/20 0113 09/17/20 1118 09/18/20 0417 09/18/20 1157 09/19/20 0254 09/19/20 0854  WBC 11.3*  --  8.4  --  6.8  --   --  7.0  --  6.8  --   HGB 8.0*   < > 8.1*   < > 7.5*   < > 8.9* 7.7* 8.5* 7.3* 7.1*  HCT 25.6*   < > 26.2*   < > 24.2*   < > 28.6* 24.3* 27.3* 23.8* 22.4*  MCV 97.0  --  97.8  --  100.4*  --   --  98.0  --  98.3  --   PLT 160  --  155  --  150  --   --  188  --  210  --    < > = values in this interval not displayed.   Basic Metabolic Panel: Recent Labs  Lab 09/13/20 1631 09/15/20 1045 09/16/20 0141 09/18/20 0417 09/19/20 0254  NA 143 143 142 143 142  K 4.3 4.1 3.7 3.7 3.5  CL 107 104 104 104 102  CO2 26 29 31 31 30   GLUCOSE 114* 87 101* 89 103*  BUN 11 14 16 16  28*  CREATININE 1.15* 1.32* 1.30* 1.24* 1.17*  CALCIUM 8.8* 8.7* 8.6* 8.9 9.3  MG  --   --   --  2.0  --   PHOS  --   --   --  3.3  --    GFR: Estimated Creatinine Clearance: 24.8 mL/min (A) (by C-G formula based on SCr of 1.17 mg/dL (H)). Liver Function Tests: Recent Labs  Lab 09/18/20 0417  AST 20  ALT 14  ALKPHOS 46  BILITOT 0.6  PROT 5.3*  ALBUMIN 2.6*   No results for input(s): LIPASE, AMYLASE in the last 168 hours. No results for input(s): AMMONIA in the last 168 hours. Coagulation Profile: Recent Labs  Lab 09/15/20 0819  09/16/20 0141 09/17/20 0113 09/18/20 0417 09/19/20 0254  INR 1.4* 1.4* 1.3* 1.5* 2.0*   Cardiac Enzymes: No results for input(s): CKTOTAL, CKMB, CKMBINDEX, TROPONINI in the last 168 hours. BNP (last 3 results) No results for input(s): PROBNP in the last 8760 hours. HbA1C: No results for input(s): HGBA1C in the last 72 hours. CBG: No results for input(s): GLUCAP in the last 168 hours. Lipid Profile: No results for input(s): CHOL, HDL, LDLCALC, TRIG, CHOLHDL, LDLDIRECT in the last 72 hours. Thyroid Function Tests: No results for input(s): TSH, T4TOTAL, FREET4, T3FREE, THYROIDAB in the last 72 hours. Anemia Panel: No results for input(s): VITAMINB12, FOLATE, FERRITIN, TIBC, IRON, RETICCTPCT in the last 72 hours. Sepsis Labs: No results for input(s): PROCALCITON, LATICACIDVEN in the last 168 hours.  No results found for this or any previous visit (from the past 240 hour(s)).   Radiology Studies: No results found.  Scheduled Meds: . sodium chloride   Intravenous Once  . carvedilol  3.125 mg Oral BID WC  . donepezil  10 mg Oral Daily  . furosemide  40 mg Oral Daily  . pregabalin  75 mg Oral Daily  . spironolactone  12.5 mg Oral Daily  . umeclidinium-vilanterol  1 puff Inhalation Daily  . warfarin  5 mg Oral ONCE-1600  . Warfarin - Pharmacist Dosing Inpatient   Does not apply q1600   Continuous Infusions: . heparin 1,000 Units/hr (09/19/20 0900)  . lactated ringers 10 mL/hr at 09/13/20 1454     LOS: 11 days    Time spent: 25 mins    Dontrel Smethers, MD [facility]   If 7PM-7AM, please contact night-coverage

## 2020-09-19 NOTE — Progress Notes (Signed)
ANTICOAGULATION CONSULT NOTE  Pharmacy Consult for Heparin/Warfarin Indication: Mechanical AVR  Patient Measurements: Height: 5\' 2"  (157.5 cm) Weight: 50.3 kg (110 lb 14.3 oz) IBW/kg (Calculated) : 50.1 Heparin Dosing Weight: 50.3 kg  Vital Signs: Temp: 98.3 F (36.8 C) (11/05 0805) Temp Source: Oral (11/05 0805) BP: 140/47 (11/05 0805) Pulse Rate: 72 (11/05 0805)  Labs: Recent Labs    09/17/20 0113 09/17/20 1118 09/17/20 1828 09/18/20 0417 09/18/20 0417 09/18/20 1157 09/19/20 0254 09/19/20 0356  HGB 7.5*   < >  --  7.7*   < > 8.5* 7.3*  --   HCT 24.2*   < >  --  24.3*  --  27.3* 23.8*  --   PLT 150  --   --  188  --   --  210  --   LABPROT 15.9*  --   --  17.4*  --   --  21.6*  --   INR 1.3*  --   --  1.5*  --   --  2.0*  --   HEPARINUNFRC 0.17*   < > 0.35 0.44  --   --   --  0.41  CREATININE  --   --   --  1.24*  --   --  1.17*  --    < > = values in this interval not displayed.    Estimated Creatinine Clearance: 24.8 mL/min (A) (by C-G formula based on SCr of 1.17 mg/dL (H)).  Medications:  Scheduled:  . carvedilol  3.125 mg Oral BID WC  . donepezil  10 mg Oral Daily  . furosemide  40 mg Oral Daily  . pregabalin  75 mg Oral Daily  . spironolactone  12.5 mg Oral Daily  . umeclidinium-vilanterol  1 puff Inhalation Daily  . Warfarin - Pharmacist Dosing Inpatient   Does not apply q1600   Infusions:  . heparin 1,000 Units/hr (09/19/20 0900)  . lactated ringers 10 mL/hr at 09/13/20 1454    Assessment: 86 yr old female presented with abdominal pain, was found to have choledocholithiasis. Patient has mechanical AVR and was on warfarin PTA. Warfarin was put on hold and bridging with heparin infusion while awaiting surgical plans. GI notes decision to hold off on any surgery at this point. Pharmacy was consulted to resume warfarin 10/31.  PTA warfarin dose: 5 mg daily, except 2.5 mg MWF  Heparin level from this am remains therapeutic will monitor with daily  levels   INR up from 1.5>>2 today.  (still subtherapeutic). We will repeat with 5mg  today and prob can go back to her home dose tomorrow. Cont heparin  Goal of Therapy:  Heparin level 0.3-0.7 units/ml INR 2.2-3 per anticoag clinic note Monitor platelets by anticoagulation protocol: Yes   Plan:  Continue heparin infusion at 1000 units/hr Give warfarin 5 mg PO x 1 today Monitor daily heparin level, CBC and INR Monitor for signs/symptoms of bleeding  11/31, PharmD, BCIDP, AAHIVP, CPP Infectious Disease Pharmacist 09/19/2020 9:24 AM

## 2020-09-19 NOTE — Plan of Care (Signed)

## 2020-09-20 LAB — TYPE AND SCREEN
ABO/RH(D): O NEG
Antibody Screen: NEGATIVE
Unit division: 0

## 2020-09-20 LAB — CBC
HCT: 25.5 % — ABNORMAL LOW (ref 36.0–46.0)
Hemoglobin: 8.3 g/dL — ABNORMAL LOW (ref 12.0–15.0)
MCH: 30.6 pg (ref 26.0–34.0)
MCHC: 32.5 g/dL (ref 30.0–36.0)
MCV: 94.1 fL (ref 80.0–100.0)
Platelets: 200 10*3/uL (ref 150–400)
RBC: 2.71 MIL/uL — ABNORMAL LOW (ref 3.87–5.11)
RDW: 16.9 % — ABNORMAL HIGH (ref 11.5–15.5)
WBC: 7.5 10*3/uL (ref 4.0–10.5)
nRBC: 0 % (ref 0.0–0.2)

## 2020-09-20 LAB — BPAM RBC
Blood Product Expiration Date: 202111292359
ISSUE DATE / TIME: 202111051504
Unit Type and Rh: 9500

## 2020-09-20 LAB — PROTIME-INR
INR: 2.2 — ABNORMAL HIGH (ref 0.8–1.2)
Prothrombin Time: 23.6 seconds — ABNORMAL HIGH (ref 11.4–15.2)

## 2020-09-20 LAB — HEPARIN LEVEL (UNFRACTIONATED): Heparin Unfractionated: 0.56 IU/mL (ref 0.30–0.70)

## 2020-09-20 MED ORDER — WARFARIN SODIUM 5 MG PO TABS: 5.0000 mg | ORAL_TABLET | Freq: Once | ORAL | Status: DC

## 2020-09-20 NOTE — Discharge Summary (Signed)
Physician Discharge Summary  [name]:[id number] DOB: [birth year] DOA: 09/08/2020  PCP: Colleen Linsey, MD  Admit date: 09/08/2020   Discharge date: 09/20/2020  Admitted From: Home.  Disposition:  Home with home services ( Home PT and RN)  Recommendations for Outpatient Follow-up:  1. Follow up with PCP in 1-2 weeks.  2. Please obtain BMP/CBC in one week. 3. Advised to follow-up with Coumadin clinic as scheduled. 4. Patient has been discharged home with home services.  Home PT and RN has been arranged. 5. Patient has been on chronic oxygen 4 L via nasal cannula.  Home Health: RN and home PT Equipment/Devices: Home oxygen therapy on 4 L via nasal cannula.  Discharge Condition: Stable. CODE STATUS:DNR Diet recommendation: Heart Healthy  Brief [facility] course: This 86 year old female with history of vitamin D deficiency, transient global amnesia, stroke, COPD on 4 L nasal cannula at home, left bundle branch block, hypertension, hyperlipidemia, CAD, chronic combined systolic and diastolic CHF, carotid artery stenosis, mechanical heart valve. Patient presents with acute abdominal pain with concern for acute cholecystitis and choledocholithiasis. She initially presented in [facility] [name] was seen by general surgery and gastroenterology. No plans for cholecystectomy, GI recommended ERCP. Patient was transferred to [facility] as anesthesiologist did not feel comfortable for patient to have procedure at [facility] hospital.  Patient underwent successful ERCP with stone removal.  Patient was started back on heparin and Coumadin, INR was subtherapeutic post ERCP.  Patient had an episode of hemoptysis which resolved.  Patient also reports having hemorrhoids.  Patient was given 1 unit of packed red blood cells for hemoglobin 7.1.  Posttransfusion hemoglobin remained stable 8.6. She was continued on heparin and coumadin. INR was 2.2 on discharge, heparin  discontinued and coumadin continued, She will follow up with Coumadin clinic in [place].  She was managed for below problems.  Discharge Diagnoses:  Active Problems:   Essential hypertension   LBBB (left bundle branch block)   S/P AVR (aortic valve replacement)   Chronic combined systolic and diastolic CHF (congestive heart failure) (HCC)   Choledocholithiasis  1. Choledocholithiasis- GI and general surgery consulted . She does not have cholecystitis clinically. Patient was started on ciprofloxacin, She underwent ERCP with extraction of CBD stone. GI recommended cholecystectomy,  Patient has refused to undergo cholecystectomy. General surgery and GI agrees with the plan and does not see any emergent need for cholecystectomy.  2. Mechanical aortic valve-  seen on transthoracic echo from 2018, patient is on Coumadin as outpatient. Goal of INR 2.2-3.0 as per outpatient Coumadin clinic note. Continue Coumadin and heparin for bridging, INR is 2.2 today Will stop heparin.  Pharmacy managing Coumadin dosing.  3. Hemoptysis- Resolved  Patient developed hemoptysis on 11/1, likely in the setting of pulmonary edemaalong with anticoagulation with heparin and Coumadin bridging. Chest x-ray showedmild bibasilaratelectasis versus infiltrate. She has been on ceftriaxone for past 8 days, She was also started on Zithromax on 09/15/20. we doubt she has pneumonia, clinically she is stable. We will continue with diuresis for pulmonary edema, and discontinue antibiotics. Hemoptysis  Resolved   4. Anemia-Continue iron supplementation. Hb dropped to 7.1,  Hb improved 8.5  5. COPD-Patient chronically on home O2 at home. Not in acute exacerbation.  6. Mild pulmonary edema- She developed mild pulmonary edema as she has not been getting her Lasix. Shewas +3 L fluid overload, received IV Lasix 40 mg x 1 with good diuresis. Patient started back on home dose of Lasix 40 mg  daily.    7. Prolonged  QTC-  Improved .QTC was prolonged at the time of admission to 528. Repeat EKG shows QTC 479. Serum magnesium was checked which was 2.2.   8. Hyperlipidemia-continue Lipitor.  9. Acute kidney injury on CKD stage IIIb-  baseline creatinine around 1.2. Creatinine is 1.17today. Follow BMP in am.  10. Dementia-continue Aricept.  11. Anxiety-continue Xanax as needed.  12. Hypertension-blood pressure is mildly elevated, continue Aldactone, Coreg.     Discharge Instructions  Discharge Instructions    Call MD for:  difficulty breathing, headache or visual disturbances   Complete by: As directed    Call MD for:  persistant dizziness or light-headedness   Complete by: As directed    Call MD for:  persistant nausea and vomiting   Complete by: As directed    Diet - low sodium heart healthy   Complete by: As directed    Diet Carb Modified   Complete by: As directed    Discharge instructions   Complete by: As directed    Advised to follow-up with primary care physician in 1 week. Advised to follow-up with Coumadin clinic as scheduled. Patient has been discharged home with home services.  Home PT and RN has been arranged. Patient has been on chronic oxygen 4 L via nasal cannula.   Increase activity slowly   Complete by: As directed      Allergies as of 09/20/2020      Reactions   Codeine Nausea And Vomiting   Penicillins Other (See Comments)   Unknown reaction - listed on MAR Has patient had a PCN reaction causing immediate rash, facial/tongue/throat swelling, SOB or lightheadedness with hypotension: Unknown Has patient had a PCN reaction causing severe rash involving mucus membranes or skin necrosis: Unknown Has patient had a PCN reaction that required hospitalization: Unknown Has patient had a PCN reaction occurring within the last 10 years: Unknown If all of the above answers are "NO", then may proceed with Cephalosporin use.      Medication List    TAKE these  medications   acetaminophen 650 MG CR tablet Commonly known as: TYLENOL Take 650 mg by mouth every 8 (eight) hours as needed for pain.   ALPRAZolam 0.25 MG tablet Commonly known as: XANAX Take 0.125-0.25 mg by mouth at bedtime as needed for anxiety.   Anoro Ellipta 62.5-25 MCG/INH Aepb Generic drug: umeclidinium-vilanterol USE 1 INHALATION ONCE DAILY.   atorvastatin 40 MG tablet Commonly known as: LIPITOR TAKE ONE TABLET BY MOUTH ONCE DAILY.   baclofen 10 MG tablet Commonly known as: LIORESAL Take 10 mg by mouth as needed for muscle spasms.   carvedilol 3.125 MG tablet Commonly known as: COREG Take 1 tablet (3.125 mg total) by mouth 2 (two) times daily with a meal. What changed:   how much to take  additional instructions   docusate sodium 100 MG capsule Commonly known as: COLACE Take 100 mg by mouth every evening.   donepezil 10 MG tablet Commonly known as: ARICEPT TAKE ONE TABLET BY MOUTH ONCE DAILY. What changed: when to take this   ergocalciferol 1.25 MG (50000 UT) capsule Commonly known as: VITAMIN D2 Take 50,000 Units by mouth every Tuesday.   ferrous sulfate 325 (65 FE) MG EC tablet Take 325 mg by mouth daily.   furosemide 40 MG tablet Commonly known as: LASIX TAKE ONE TABLET BY MOUTH ONCE DAILY.   HYDROcodone-acetaminophen 7.5-325 MG tablet Commonly known as: NORCO Take 1 tablet by mouth every 6 (  six) hours as needed for moderate pain.   hydrocortisone 2.5 % rectal cream Commonly known as: ANUSOL-HC Place 1 application rectally 3 (three) times daily as needed for hemorrhoids or anal itching.   levalbuterol 0.63 MG/3ML nebulizer solution Commonly known as: XOPENEX USE 1 VIAL VIA NEBULIZER EVERY 8 HOURS AS NEEDED.   meclizine 25 MG tablet Commonly known as: ANTIVERT Take 25 mg by mouth 3 (three) times daily as needed for dizziness.   melatonin 3 MG Tabs tablet Take 1 tablet by mouth at bedtime.   methylPREDNISolone 4 MG tablet Commonly known  as: MEDROL Take 4 mg by mouth 2 (two) times daily as needed.   multivitamin-lutein Caps capsule Take 1 capsule by mouth daily.   ondansetron 4 MG tablet Commonly known as: ZOFRAN Take 4 mg by mouth every 8 (eight) hours as needed for nausea or vomiting.   polyethylene glycol 17 g packet Commonly known as: MIRALAX / GLYCOLAX Take 17 g by mouth daily as needed for moderate constipation.   pregabalin 75 MG capsule Commonly known as: LYRICA TAKE 1 CAPSULE BY MOUTH EVERY OTHER DAY.   spironolactone 25 MG tablet Commonly known as: ALDACTONE TAKE (1/2) TABLET BY MOUTH DAILY. What changed: See the new instructions.   traZODone 50 MG tablet Commonly known as: DESYREL Take 0.5-1 tablets (25-50 mg total) by mouth at bedtime as needed for sleep.   warfarin 5 MG tablet Commonly known as: COUMADIN Take as directed. If you are unsure how to take this medication, talk to your nurse or doctor. Original instructions: TAKE 1/2 TO 1 TABLET ONCE DAILY AS DIRECTED BY COUMADIN CLINIC. What changed: See the new instructions.   zinc sulfate 50 MG Caps capsule Take 50 mg by mouth 2 (two) times daily.       Follow-up Information    [facility] Follow up.   Contact information: [address] [place] [address] [phone]       Colleen Linsey, MD Follow up in 1 week(s).   Specialty: Internal Medicine Contact information: [address] [place] [address] [phone]        Lewayne Bunting, MD .   Specialty: Cardiology Contact information: [address] [place] [address] [phone]        [facility] [address] [facility] Follow up in 1 week(s).   Specialty: Cardiology Contact information: [address] [place] [address] [phone]             Allergies  Allergen Reactions  . Codeine Nausea And Vomiting  . Penicillins Other (See Comments)    Unknown reaction - listed on  MAR Has patient had a PCN reaction causing immediate rash, facial/tongue/throat swelling, SOB or lightheadedness with hypotension: Unknown Has patient had a PCN reaction causing severe rash involving mucus membranes or skin necrosis: Unknown Has patient had a PCN reaction that required hospitalization: Unknown Has patient had a PCN reaction occurring within the last 10 years: Unknown If all of the above answers are "NO", then may proceed with Cephalosporin use.    Consultations:  General surgery, gastroenterology, cardiology   Procedures/Studies: DG Chest Port 1 View  Result Date: 09/15/2020 CLINICAL DATA:  Respiratory distress. EXAM: PORTABLE CHEST 1 VIEW COMPARISON:  February 07, 2020 FINDINGS: The lungs are hyperinflated. Multiple sternal wires are seen. Moderate severity diffuse chronic appearing increased interstitial lung markings are present. Mild areas of atelectasis and/or infiltrate are seen within the bilateral lung bases, left greater than  right. There is a small left pleural effusion. No pneumothorax is seen. The cardiac silhouette is moderately enlarged. There is marked severity calcification of the aortic arch. Degenerative changes seen throughout the thoracic spine. IMPRESSION: 1. Chronic appearing increased interstitial lung markings with mild bibasilar atelectasis and/or infiltrate. 2. Small left pleural effusion. Electronically Signed   By: Aram Candelahaddeus  Houston M.D.   On: 09/15/2020 01:04   DG ERCP BILIARY & PANCREATIC DUCTS  Result Date: 09/13/2020 CLINICAL DATA:  86 year old female with a history cholelithiasis/choledocholithiasis EXAM: ERCP TECHNIQUE: Multiple spot images obtained with the fluoroscopic device and submitted for interpretation post-procedure. FLUOROSCOPY TIME:  Fluoroscopy Time:  2 minutes 20 seconds COMPARISON:  None. FINDINGS: Limited intraoperative fluoroscopic spot images during ERCP. Initial image demonstrates the endoscope projecting upper a safety wire in  position. Partial opacification of the extrahepatic biliary system with rounded filling defects identified. Images demonstrate deployment of a retrieval balloon. IMPRESSION: Limited images during ERCP demonstrates treatment of choledocholithiasis with deployment of a retrieval balloon. Please refer to the dictated operative report for full details of intraoperative findings and procedure. Electronically Signed   By: Gilmer MorJaime  Wagner D.O.   On: 09/13/2020 13:46   ECHOCARDIOGRAM COMPLETE  Result Date: 09/09/2020    ECHOCARDIOGRAM REPORT   Patient Name:   Josephine CablesRUTH E [name] Date of Exam: 09/09/2020 Medical Rec #:  [id number]       Height:       62.0 in Accession #:    [id number]      Weight:       111.0 lb Date of Birth:  1926-05-26      BSA:          1.488 m Patient Age:    89 years        BP:           134/110 mmHg Patient Gender: F               HR:           63 bpm. [facility] Procedure: 2D Echo, Cardiac Doppler and Color Doppler Indications:    Pre-operative cardiovascular examination V72.81 / Z01.810  History:        Patient has prior history of Echocardiogram examinations. CAD,                 Stroke; Risk Factors:Hypertension and Dyslipidemia. Aortic valve                 replacement.  Sonographer:    Celesta GentileBernard White RCS Referring Phys: [id number] PR[facility]IK [facility] IMPRESSIONS  1. Left ventricular ejection fraction, by estimation, is 55 to 60%. The left ventricle has normal function. The left ventricle has no regional wall motion abnormalities. There is moderate left ventricular hypertrophy. Left ventricular diastolic parameters are consistent with Grade I diastolic dysfunction (impaired relaxation). Elevated left atrial pressure.  2. Right ventricular systolic function is normal. The right ventricular size is normal.  3. Left atrial size was severely dilated.  4. Right atrial size was mildly dilated.  5. The mitral valve is abnormal. Mild to moderate mitral valve regurgitation. No evidence of mitral  stenosis. Moderate mitral annular calcification.  6. History of AV replacement, unknown type. . The aortic valve has been repaired/replaced. There is moderate calcification of the aortic valve. There is moderate thickening of the aortic valve. Aortic valve regurgitation is moderate. No aortic stenosis is present.  7. The inferior vena cava is normal in size with greater than 50% respiratory  variability, suggesting right atrial pressure of 3 mmHg. FINDINGS  Left Ventricle: Left ventricular ejection fraction, by estimation, is 55 to 60%. The left ventricle has normal function. The left ventricle has no regional wall motion abnormalities. The left ventricular internal cavity size was normal in size. There is  moderate left ventricular hypertrophy. Left ventricular diastolic parameters are consistent with Grade I diastolic dysfunction (impaired relaxation). Elevated left atrial pressure. Right Ventricle: The right ventricular size is normal. No increase in right ventricular wall thickness. Right ventricular systolic function is normal. Left Atrium: Left atrial size was severely dilated. Right Atrium: Right atrial size was mildly dilated. Pericardium: There is no evidence of pericardial effusion. Mitral Valve: The mitral valve is abnormal. There is mild thickening of the mitral valve leaflet(s). There is mild calcification of the mitral valve leaflet(s). Moderate mitral annular calcification. Mild to moderate mitral valve regurgitation. No evidence of mitral valve stenosis. Tricuspid Valve: The tricuspid valve is normal in structure. Tricuspid valve regurgitation is trivial. No evidence of tricuspid stenosis. Aortic Valve: History of AV replacement, unknown type. The aortic valve has been repaired/replaced. There is moderate calcification of the aortic valve. There is moderate thickening of the aortic valve. There is moderate aortic valve annular calcification. Aortic valve regurgitation is moderate. No aortic stenosis  is present. Pulmonic Valve: The pulmonic valve was not well visualized. Pulmonic valve regurgitation is not visualized. No evidence of pulmonic stenosis. Aorta: The aortic root is normal in size and structure. Pulmonary Artery: Indeterminant PASP, inadequate TR jet. Venous: The inferior vena cava is normal in size with greater than 50% respiratory variability, suggesting right atrial pressure of 3 mmHg. IAS/Shunts: No atrial level shunt detected by color flow Doppler.  LEFT VENTRICLE PLAX 2D LVIDd:         5.11 cm      Diastology LVIDs:         3.09 cm      LV e' medial:    4.79 cm/s LV PW:         1.25 cm      LV E/e' medial:  26.1 LV IVS:        1.22 cm      LV e' lateral:   4.13 cm/s LVOT diam:     1.50 cm      LV E/e' lateral: 30.3 LV SV:         45 LV SV Index:   31 LVOT Area:     1.77 cm  LV Volumes (MOD) LV vol d, MOD A2C: 143.0 ml LV vol d, MOD A4C: 161.0 ml LV vol s, MOD A2C: 54.4 ml LV vol s, MOD A4C: 70.0 ml LV SV MOD A2C:     88.6 ml LV SV MOD A4C:     161.0 ml LV SV MOD BP:      91.6 ml RIGHT VENTRICLE RV S prime:     9.68 cm/s TAPSE (M-mode): 1.9 cm LEFT ATRIUM             Index       RIGHT ATRIUM           Index LA diam:        4.00 cm 2.69 cm/m  RA Area:     15.50 cm LA Vol (A2C):   87.8 ml 58.99 ml/m RA Volume:   45.80 ml  30.77 ml/m LA Vol (A4C):   88.4 ml 59.39 ml/m LA Biplane Vol: 89.3 ml 59.99 ml/m  AORTIC VALVE LVOT Vmax:  96.70 cm/s LVOT Vmean:  57.100 cm/s LVOT VTI:    0.257 m  AORTA Ao Root diam: 3.00 cm MITRAL VALVE MV Area (PHT): 3.21 cm      SHUNTS MV Decel Time: 236 msec      Systemic VTI:  0.26 m MR Peak grad:    146.4 mmHg  Systemic Diam: 1.50 cm MR Mean grad:    91.0 mmHg MR Vmax:         605.00 cm/s MR Vmean:        443.0 cm/s MR PISA:         3.08 cm MR PISA Eff ROA: 16 mm MR PISA Radius:  0.70 cm MV E velocity: 125.00 cm/s MV A velocity: 144.00 cm/s MV E/A ratio:  0.87 Dina Rich MD Electronically signed by Dina Rich MD Signature Date/Time: 09/09/2020/4:14:42  PM    Final    CT Angio Chest/Abd/Pel for Dissection W and/or Wo Contrast  Result Date: 09/08/2020 CLINICAL DATA:  Chest pain and back pain. Concern for aortic dissection. EXAM: CT ANGIOGRAPHY CHEST, ABDOMEN AND PELVIS TECHNIQUE: Non-contrast CT of the chest was initially obtained. Multidetector CT imaging through the chest, abdomen and pelvis was performed using the standard protocol during bolus administration of intravenous contrast. Multiplanar reconstructed images and MIPs were obtained and reviewed to evaluate the vascular anatomy. CONTRAST:  80mL OMNIPAQUE IOHEXOL 350 MG/ML SOLN COMPARISON:  CT chest dated October 25, 2019. FINDINGS: CTA CHEST FINDINGS Cardiovascular: There are advanced vascular calcifications throughout the thoracic aorta. There is no evidence for a thoracic aortic dissection. The heart is enlarged. There are coronary artery calcifications. There is no definite large centrally located pulmonary embolism, however detection of pulmonary emboli is severely limited by contrast timing. The arch vessels are patent where visualized. Mediastinum/Nodes: -- No mediastinal lymphadenopathy. -- No hilar lymphadenopathy. -- No axillary lymphadenopathy. -- No supraclavicular lymphadenopathy. -- Normal thyroid gland where visualized. -  Unremarkable esophagus. Lungs/Pleura: Mild to moderate emphysematous changes are noted bilaterally there is atelectasis at the lung bases. There is a new linear opacity in the right middle lobe favored to represent an area atelectasis. There is no pneumothorax or large pleural effusion. The trachea is secondary to respiratory motion artifact. Musculoskeletal: No chest wall abnormality. No bony spinal canal stenosis. Review of the MIP images confirms the above findings. CTA ABDOMEN AND PELVIS FINDINGS VASCULAR Aorta: There are atherosclerotic changes throughout the abdominal aorta. There is an infrarenal abdominal aortic aneurysm measuring approximately 2.9 x 3.1 cm.  Celiac: There is severe narrowing at the origin of the celiac axis. The remaining portions are widely patent. SMA: There is moderate narrowing at the origin of SMA. Renals: Both renal arteries are patent without evidence of aneurysm, dissection, vasculitis, fibromuscular dysplasia or significant stenosis. IMA: Patent without evidence of aneurysm, dissection, vasculitis or significant stenosis. Inflow: Patent without evidence of aneurysm, dissection, vasculitis or significant stenosis. Veins: No obvious venous abnormality within the limitations of this arterial phase study. Review of the MIP images confirms the above findings. NON-VASCULAR Hepatobiliary: The liver is normal. The gallbladder is distended with mild gallbladder wall thickening and pericholecystic free fluid.There is moderate to severe intrahepatic and extrahepatic biliary ductal dilatation. There appears to be an abnormal filling defect within the distal common bile duct. Pancreas: There is pancreatic ductal dilatation. There is no definite pancreatic mass. Spleen: Unremarkable. Adrenals/Urinary Tract: --Adrenal glands: Unremarkable. --Right kidney/ureter: No hydronephrosis or radiopaque kidney stones. --Left kidney/ureter: No hydronephrosis or radiopaque kidney stones. --Urinary bladder: Unremarkable. Stomach/Bowel: --Stomach/Duodenum:  No hiatal hernia or other gastric abnormality. Normal duodenal course and caliber. --Small bowel: Unremarkable. --Colon: Rectosigmoid diverticulosis without acute inflammation. --Appendix: Not visualized. No right lower quadrant inflammation or free fluid. Lymphatic: --No retroperitoneal lymphadenopathy. --No mesenteric lymphadenopathy. --No pelvic or inguinal lymphadenopathy. Reproductive: Unremarkable Other: No ascites or free air. There are bilateral fat containing inguinal hernias. The hernia on the right contains a loop of small bowel without evidence for an obstruction. Musculoskeletal. There are old healed right  pelvic fractures. The patient is status post prior percutaneous pinning of the right hip. The there is no definite acute displaced fracture. Detection of nondisplaced fractures is limited by osteopenia. Review of the MIP images confirms the above findings. IMPRESSION: 1. No evidence of thoracic or abdominal aortic dissection. 2. There is an infrarenal abdominal aortic aneurysm measuring approximately 2.9 x 3.1 cm. Recommend follow-up every 3 years. This recommendation follows ACR consensus guidelines: White Paper of the ACR Incidental Findings Committee II on Vascular Findings. J Am Coll Radiol 2013; 10:789-794. 3. There is moderate to severe intrahepatic and extrahepatic biliary ductal dilatation with an abnormal filling defect within the distal common bile duct. Correlation with MRCP/ERCP is recommended. The gallbladder is distended with gallbladder wall thickening pericholecystic free fluid. If there is clinical suspicion for acute cholecystitis, follow-up with ultrasound is recommended. 4. Rectosigmoid diverticulosis without acute inflammation. 5. Bilateral fat containing inguinal hernias. The hernia on the right contains a loop of small bowel without evidence for obstruction. 6. Cardiomegaly and coronary artery calcifications. Aortic Atherosclerosis (ICD10-I70.0) and Emphysema (ICD10-J43.9). Electronically Signed   By: Katherine Mantle M.D.   On: 09/08/2020 16:36   US Abdomen Limited RUQ (LIVER/GB)  Result Date: 09/08/2020 CLINICAL DATA:  Right upper quadrant abdominal pain. EXAM: ULTRASOUND ABDOMEN LIMITED RIGHT UPPER QUADRANT COMPARISON:  None. FINDINGS: Gallbladder: Gallbladder sludge and multiple shadowing echogenic gallstones are seen within the gallbladder lumen. The largest measures approximately 1.2 cm. The gallbladder wall measures 3.0 mm. A mild amount of pericholecystic fluid is noted. No sonographic Murphy sign noted by sonographer. Common bile duct: Diameter: 13.0 A 1.2 cm echogenic focus is  seen within the lumen of the distal common bile duct. Liver: No focal lesion identified. There is diffusely increased echogenicity of the liver parenchyma. Portal vein is patent on color Doppler imaging with normal direction of blood flow towards the liver. Other: None. IMPRESSION: 1. Cholelithiasis and gallbladder sludge. 2. 1.2 cm gallstone within the distal aspect of a dilated common bile duct. 3. Fatty liver. Electronically Signed   By: Aram Candela M.D.   On: 09/08/2020 17:28    Echocardiogram, ERCP with stone removal.   Subjective: Patient was seen and examined at bedside.  No overnight events.  Patient reports feeling much better, stronger.  She wants to go home today.  her INR is 2.2 which is therapeutic.  Discharge Exam: Vitals:   09/20/20 0818 09/20/20 0849  BP: (!) 133/46   Pulse: 78   Resp: 20   Temp: 97.9 F (36.6 C)   SpO2: 94% 100%   Vitals:   09/19/20 2210 09/20/20 0426 09/20/20 0818 09/20/20 0849  BP: (!) 123/51 (!) 130/54 (!) 133/46   Pulse: 75 65 78   Resp:  16 20   Temp:  (!) 97.5 F (36.4 C) 97.9 F (36.6 C)   TempSrc:  Oral Oral   SpO2:  100% 94% 100%  Weight:      Height:        General: Pt is alert, awake, not  in acute distress Cardiovascular: RRR, S1/S2 +, no rubs, no gallops Respiratory: CTA bilaterally, no wheezing, no rhonchi Abdominal: Soft, NT, ND, bowel sounds + Extremities: no edema, no cyanosis    The results of significant diagnostics from this hospitalization (including imaging, microbiology, ancillary and laboratory) are listed below for reference.     Microbiology: No results found for this or any previous visit (from the past 240 hour(s)).   Labs: BNP (last 3 results) Recent Labs    02/05/20 0727 02/07/20 1234 02/09/20 0632  BNP 1,712.0* 1,331.0* 386.0*   Basic Metabolic Panel: Recent Labs  Lab 09/13/20 1631 09/15/20 1045 09/16/20 0141 09/18/20 0417 09/19/20 0254  NA 143 143 142 143 142  K 4.3 4.1 3.7 3.7 3.5   CL 107 104 104 104 102  CO2 26 29 31 31 30   GLUCOSE 114* 87 101* 89 103*  BUN 11 14 16 16  28*  CREATININE 1.15* 1.32* 1.30* 1.24* 1.17*  CALCIUM 8.8* 8.7* 8.6* 8.9 9.3  MG  --   --   --  2.0  --   PHOS  --   --   --  3.3  --    Liver Function Tests: Recent Labs  Lab 09/18/20 0417  AST 20  ALT 14  ALKPHOS 46  BILITOT 0.6  PROT 5.3*  ALBUMIN 2.6*   No results for input(s): LIPASE, AMYLASE in the last 168 hours. No results for input(s): AMMONIA in the last 168 hours. CBC: Recent Labs  Lab 09/16/20 0141 09/16/20 0141 09/17/20 0113 09/17/20 1118 09/18/20 0417 09/18/20 0417 09/18/20 1157 09/19/20 0254 09/19/20 0854 09/19/20 2102 09/20/20 0504  WBC 8.4  --  6.8  --  7.0  --   --  6.8  --   --  7.5  HGB 8.1*   < > 7.5*   < > 7.7*   < > 8.5* 7.3* 7.1* 8.3* 8.3*  HCT 26.2*   < > 24.2*   < > 24.3*   < > 27.3* 23.8* 22.4* 26.0* 25.5*  MCV 97.8  --  100.4*  --  98.0  --   --  98.3  --   --  94.1  PLT 155  --  150  --  188  --   --  210  --   --  200   < > = values in this interval not displayed.   Cardiac Enzymes: No results for input(s): CKTOTAL, CKMB, CKMBINDEX, TROPONINI in the last 168 hours. BNP: Invalid input(s): POCBNP CBG: No results for input(s): GLUCAP in the last 168 hours. D-Dimer No results for input(s): DDIMER in the last 72 hours. Hgb A1c No results for input(s): HGBA1C in the last 72 hours. Lipid Profile No results for input(s): CHOL, HDL, LDLCALC, TRIG, CHOLHDL, LDLDIRECT in the last 72 hours. Thyroid function studies No results for input(s): TSH, T4TOTAL, T3FREE, THYROIDAB in the last 72 hours.  Invalid input(s): FREET3 Anemia work up No results for input(s): VITAMINB12, FOLATE, FERRITIN, TIBC, IRON, RETICCTPCT in the last 72 hours. Urinalysis    Component Value Date/Time   COLORURINE STRAW (A) 09/08/2020 1322   APPEARANCEUR CLEAR 09/08/2020 1322   LABSPEC 1.004 (L) 09/08/2020 1322   PHURINE 7.0 09/08/2020 1322   GLUCOSEU NEGATIVE 09/08/2020  1322   GLUCOSEU NEGATIVE 04/10/2008 1246   HGBUR NEGATIVE 09/08/2020 1322   BILIRUBINUR NEGATIVE 09/08/2020 1322   KETONESUR NEGATIVE 09/08/2020 1322   PROTEINUR NEGATIVE 09/08/2020 1322   UROBILINOGEN 0.2 10/20/2019 1404   NITRITE NEGATIVE 09/08/2020  1322   LEUKOCYTESUR NEGATIVE 09/08/2020 1322   Sepsis Labs Invalid input(s): PROCALCITONIN,  WBC,  LACTICIDVEN Microbiology No results found for this or any previous visit (from the past 240 hour(s)).   Time coordinating discharge: Over 30 minutes  SIGNED:   Cipriano Bunker, MD  [facility] 09/20/2020, 11:48 AM Pager   If 7PM-7AM, please contact night-coverage [email or web address]

## 2020-09-20 NOTE — Discharge Instructions (Signed)
Advised to follow-up with primary care physician in 1 week. Advised to follow-up with Coumadin clinic as scheduled. Patient has been discharged home with home services.  Home PT and RN has been arranged. Patient has been on chronic oxygen 4 L via nasal cannula.

## 2020-09-20 NOTE — Progress Notes (Signed)
ANTICOAGULATION CONSULT NOTE  Pharmacy Consult for Heparin/Warfarin Indication: Mechanical AVR  Patient Measurements: Height: 5\' 2"  (157.5 cm) Weight: 50.3 kg (110 lb 14.3 oz) IBW/kg (Calculated) : 50.1 Heparin Dosing Weight: 50.3 kg  Vital Signs: Temp: 97.5 F (36.4 C) (11/06 0426) Temp Source: Oral (11/06 0426) BP: 130/54 (11/06 0426) Pulse Rate: 65 (11/06 0426)  Labs: Recent Labs    09/18/20 0417 09/18/20 1157 09/19/20 0254 09/19/20 0254 09/19/20 0356 09/19/20 0854 09/19/20 0854 09/19/20 2102 09/20/20 0504  HGB 7.7*   < > 7.3*   < >  --  7.1*   < > 8.3* 8.3*  HCT 24.3*   < > 23.8*   < >  --  22.4*  --  26.0* 25.5*  PLT 188  --  210  --   --   --   --   --  200  LABPROT 17.4*  --  21.6*  --   --   --   --   --  23.6*  INR 1.5*  --  2.0*  --   --   --   --   --  2.2*  HEPARINUNFRC 0.44  --   --   --  0.41  --   --   --  0.56  CREATININE 1.24*  --  1.17*  --   --   --   --   --   --    < > = values in this interval not displayed.    Estimated Creatinine Clearance: 24.8 mL/min (A) (by C-G formula based on SCr of 1.17 mg/dL (H)).  Medications:  Scheduled:  . carvedilol  3.125 mg Oral BID WC  . donepezil  10 mg Oral Daily  . furosemide  40 mg Oral Daily  . pregabalin  75 mg Oral Daily  . spironolactone  12.5 mg Oral Daily  . umeclidinium-vilanterol  1 puff Inhalation Daily  . Warfarin - Pharmacist Dosing Inpatient   Does not apply q1600   Infusions:  . heparin 1,000 Units/hr (09/19/20 1704)  . lactated ringers 10 mL/hr at 09/13/20 1454    Assessment: 86 yr old female presented with abdominal pain, was found to have choledocholithiasis. Patient has mechanical AVR and was on warfarin PTA. Warfarin was put on hold and bridging with heparin infusion while awaiting surgical plans. GI notes decision to hold off on any surgery at this point. Pharmacy was consulted to resume warfarin 10/31.  PTA warfarin dose: 5 mg daily, except 2.5 mg MWF  Heparin level from this  am remains therapeutic will monitor with daily levels   INR continues to increase up to lower end of goal at 2.2 today. Will repeat warfarin 5mg  today and continue heparin likely through tomorrow.    Goal of Therapy:  Heparin level 0.3-0.7 units/ml INR 2.2-3 per anticoag clinic note Monitor platelets by anticoagulation protocol: Yes   Plan:  Continue heparin infusion at 1000 units/hr Give warfarin 5 mg PO x 1 today Monitor daily heparin level, CBC and INR Monitor for signs/symptoms of bleeding  11/31, PharmD PGY2 ID Pharmacy Resident Phone between 7 am - 3:30 pm:  Please check AMION for all [facility] phone numbers After 10:00 PM, call Main Pharmacy [phone]  09/20/2020 8:01 AM

## 2020-09-20 NOTE — Plan of Care (Signed)

## 2020-09-23 ENCOUNTER — Telehealth: Payer: Self-pay

## 2020-09-23 DIAGNOSIS — N1832 Chronic kidney disease, stage 3b: Secondary | ICD-10-CM | POA: Diagnosis not present

## 2020-09-23 DIAGNOSIS — Z9181 History of falling: Secondary | ICD-10-CM | POA: Diagnosis not present

## 2020-09-23 DIAGNOSIS — I6529 Occlusion and stenosis of unspecified carotid artery: Secondary | ICD-10-CM | POA: Diagnosis not present

## 2020-09-23 DIAGNOSIS — N179 Acute kidney failure, unspecified: Secondary | ICD-10-CM | POA: Diagnosis not present

## 2020-09-23 DIAGNOSIS — I2510 Atherosclerotic heart disease of native coronary artery without angina pectoris: Secondary | ICD-10-CM | POA: Diagnosis not present

## 2020-09-23 DIAGNOSIS — Z952 Presence of prosthetic heart valve: Secondary | ICD-10-CM | POA: Diagnosis not present

## 2020-09-23 DIAGNOSIS — E785 Hyperlipidemia, unspecified: Secondary | ICD-10-CM | POA: Diagnosis not present

## 2020-09-23 DIAGNOSIS — I130 Hypertensive heart and chronic kidney disease with heart failure and stage 1 through stage 4 chronic kidney disease, or unspecified chronic kidney disease: Secondary | ICD-10-CM | POA: Diagnosis not present

## 2020-09-23 DIAGNOSIS — J449 Chronic obstructive pulmonary disease, unspecified: Secondary | ICD-10-CM | POA: Diagnosis not present

## 2020-09-23 DIAGNOSIS — D649 Anemia, unspecified: Secondary | ICD-10-CM | POA: Diagnosis not present

## 2020-09-23 DIAGNOSIS — G454 Transient global amnesia: Secondary | ICD-10-CM | POA: Diagnosis not present

## 2020-09-23 DIAGNOSIS — I5042 Chronic combined systolic (congestive) and diastolic (congestive) heart failure: Secondary | ICD-10-CM | POA: Diagnosis not present

## 2020-09-23 DIAGNOSIS — Z7901 Long term (current) use of anticoagulants: Secondary | ICD-10-CM | POA: Diagnosis not present

## 2020-09-23 DIAGNOSIS — I10 Essential (primary) hypertension: Secondary | ICD-10-CM | POA: Diagnosis not present

## 2020-09-23 DIAGNOSIS — E559 Vitamin D deficiency, unspecified: Secondary | ICD-10-CM | POA: Diagnosis not present

## 2020-09-23 DIAGNOSIS — Z9981 Dependence on supplemental oxygen: Secondary | ICD-10-CM | POA: Diagnosis not present

## 2020-09-23 DIAGNOSIS — Z8673 Personal history of transient ischemic attack (TIA), and cerebral infarction without residual deficits: Secondary | ICD-10-CM | POA: Diagnosis not present

## 2020-09-23 DIAGNOSIS — I4581 Long QT syndrome: Secondary | ICD-10-CM | POA: Diagnosis not present

## 2020-09-23 DIAGNOSIS — F0390 Unspecified dementia without behavioral disturbance: Secondary | ICD-10-CM | POA: Diagnosis not present

## 2020-09-23 DIAGNOSIS — Z48815 Encounter for surgical aftercare following surgery on the digestive system: Secondary | ICD-10-CM | POA: Diagnosis not present

## 2020-09-23 DIAGNOSIS — K8050 Calculus of bile duct without cholangitis or cholecystitis without obstruction: Secondary | ICD-10-CM | POA: Diagnosis not present

## 2020-09-23 DIAGNOSIS — F419 Anxiety disorder, unspecified: Secondary | ICD-10-CM | POA: Diagnosis not present

## 2020-09-23 DIAGNOSIS — I447 Left bundle-branch block, unspecified: Secondary | ICD-10-CM | POA: Diagnosis not present

## 2020-09-23 NOTE — Telephone Encounter (Signed)
1243 pm.  Phone call made to patient to schedule a visit for next week.  No answer but message has been left requesting call back.  PLAN:  Awaiting call back.  I will reach out to patient again if no call back.

## 2020-09-24 ENCOUNTER — Telehealth: Payer: Self-pay

## 2020-09-24 DIAGNOSIS — I110 Hypertensive heart disease with heart failure: Secondary | ICD-10-CM | POA: Diagnosis not present

## 2020-09-24 NOTE — Telephone Encounter (Signed)
9 am.  Phone call made to [name]-caregiver for patient.  Visit is scheduled for next Friday, November 19 th at 9 am.

## 2020-09-25 ENCOUNTER — Ambulatory Visit (INDEPENDENT_AMBULATORY_CARE_PROVIDER_SITE_OTHER): Payer: Medicare Other | Admitting: Cardiology

## 2020-09-25 DIAGNOSIS — K8050 Calculus of bile duct without cholangitis or cholecystitis without obstruction: Secondary | ICD-10-CM | POA: Diagnosis not present

## 2020-09-25 DIAGNOSIS — N1832 Chronic kidney disease, stage 3b: Secondary | ICD-10-CM | POA: Diagnosis not present

## 2020-09-25 DIAGNOSIS — Z5181 Encounter for therapeutic drug level monitoring: Secondary | ICD-10-CM

## 2020-09-25 DIAGNOSIS — I6350 Cerebral infarction due to unspecified occlusion or stenosis of unspecified cerebral artery: Secondary | ICD-10-CM

## 2020-09-25 DIAGNOSIS — I130 Hypertensive heart and chronic kidney disease with heart failure and stage 1 through stage 4 chronic kidney disease, or unspecified chronic kidney disease: Secondary | ICD-10-CM | POA: Diagnosis not present

## 2020-09-25 DIAGNOSIS — Z952 Presence of prosthetic heart valve: Secondary | ICD-10-CM

## 2020-09-25 DIAGNOSIS — I5042 Chronic combined systolic (congestive) and diastolic (congestive) heart failure: Secondary | ICD-10-CM | POA: Diagnosis not present

## 2020-09-25 DIAGNOSIS — N179 Acute kidney failure, unspecified: Secondary | ICD-10-CM | POA: Diagnosis not present

## 2020-09-25 DIAGNOSIS — I359 Nonrheumatic aortic valve disorder, unspecified: Secondary | ICD-10-CM

## 2020-09-25 DIAGNOSIS — J449 Chronic obstructive pulmonary disease, unspecified: Secondary | ICD-10-CM | POA: Diagnosis not present

## 2020-09-25 LAB — POCT INR: INR: 2.2 (ref 2.0–3.0)

## 2020-09-26 ENCOUNTER — Encounter: Payer: Self-pay | Admitting: Cardiology

## 2020-09-26 DIAGNOSIS — I6350 Cerebral infarction due to unspecified occlusion or stenosis of unspecified cerebral artery: Secondary | ICD-10-CM

## 2020-09-26 DIAGNOSIS — J449 Chronic obstructive pulmonary disease, unspecified: Secondary | ICD-10-CM | POA: Diagnosis not present

## 2020-09-26 DIAGNOSIS — K8050 Calculus of bile duct without cholangitis or cholecystitis without obstruction: Secondary | ICD-10-CM | POA: Diagnosis not present

## 2020-09-26 DIAGNOSIS — I5042 Chronic combined systolic (congestive) and diastolic (congestive) heart failure: Secondary | ICD-10-CM | POA: Diagnosis not present

## 2020-09-26 DIAGNOSIS — I130 Hypertensive heart and chronic kidney disease with heart failure and stage 1 through stage 4 chronic kidney disease, or unspecified chronic kidney disease: Secondary | ICD-10-CM | POA: Diagnosis not present

## 2020-09-26 DIAGNOSIS — Z952 Presence of prosthetic heart valve: Secondary | ICD-10-CM

## 2020-09-26 DIAGNOSIS — Z5181 Encounter for therapeutic drug level monitoring: Secondary | ICD-10-CM

## 2020-09-26 DIAGNOSIS — I359 Nonrheumatic aortic valve disorder, unspecified: Secondary | ICD-10-CM

## 2020-09-26 DIAGNOSIS — N179 Acute kidney failure, unspecified: Secondary | ICD-10-CM | POA: Diagnosis not present

## 2020-09-26 DIAGNOSIS — N1832 Chronic kidney disease, stage 3b: Secondary | ICD-10-CM | POA: Diagnosis not present

## 2020-09-29 DIAGNOSIS — I5042 Chronic combined systolic (congestive) and diastolic (congestive) heart failure: Secondary | ICD-10-CM | POA: Diagnosis not present

## 2020-09-29 DIAGNOSIS — N179 Acute kidney failure, unspecified: Secondary | ICD-10-CM | POA: Diagnosis not present

## 2020-09-29 DIAGNOSIS — K8050 Calculus of bile duct without cholangitis or cholecystitis without obstruction: Secondary | ICD-10-CM | POA: Diagnosis not present

## 2020-09-29 DIAGNOSIS — I130 Hypertensive heart and chronic kidney disease with heart failure and stage 1 through stage 4 chronic kidney disease, or unspecified chronic kidney disease: Secondary | ICD-10-CM | POA: Diagnosis not present

## 2020-09-29 DIAGNOSIS — J449 Chronic obstructive pulmonary disease, unspecified: Secondary | ICD-10-CM | POA: Diagnosis not present

## 2020-09-29 DIAGNOSIS — N1832 Chronic kidney disease, stage 3b: Secondary | ICD-10-CM | POA: Diagnosis not present

## 2020-09-29 NOTE — Progress Notes (Signed)
This encounter was created in error - please disregard.

## 2020-09-30 ENCOUNTER — Encounter: Payer: Self-pay | Admitting: Orthopaedic Surgery

## 2020-09-30 ENCOUNTER — Other Ambulatory Visit: Payer: Self-pay

## 2020-09-30 ENCOUNTER — Ambulatory Visit (INDEPENDENT_AMBULATORY_CARE_PROVIDER_SITE_OTHER): Payer: Medicare Other | Admitting: Orthopaedic Surgery

## 2020-09-30 VITALS — Ht 62.0 in | Wt 115.0 lb

## 2020-09-30 DIAGNOSIS — G629 Polyneuropathy, unspecified: Secondary | ICD-10-CM

## 2020-09-30 DIAGNOSIS — I6350 Cerebral infarction due to unspecified occlusion or stenosis of unspecified cerebral artery: Secondary | ICD-10-CM

## 2020-09-30 DIAGNOSIS — M792 Neuralgia and neuritis, unspecified: Secondary | ICD-10-CM | POA: Diagnosis not present

## 2020-09-30 NOTE — Progress Notes (Signed)
Patient [name], female DOB:1926/01/13, 86 y.o. UGQ:[id number]  Chief Complaint  Patient presents with   Peripheral Neuropathy    Peripheral neuropathy.    HPI  [name] is a 86 y.o. female who has peripheral neuropathy.  She uses a cane.  She has been on Lyrica which I call in periodically.  She has no new trauma.  She has no redness or trauma.   Body mass index is 21.03 kg/m.  ROS  Review of Systems  Constitutional: Positive for activity change.  Musculoskeletal: Positive for arthralgias and gait problem.  All other systems reviewed and are negative.   All other systems reviewed and are negative.  The following is a summary of the past history medically, past history surgically, known current medicines, social history and family history.  This information is gathered electronically by the computer from prior information and documentation.  I review this each visit and have found including this information at this point in the chart is beneficial and informative.    Past Medical History:  Diagnosis Date   Anemia    Anxiety    Arthritis    Blood transfusion without reported diagnosis    Carotid artery stenosis    Without infarction   Cataract    Chronic combined systolic (congestive) and diastolic (congestive) heart failure (HCC)    COPD (chronic obstructive pulmonary disease) (HCC)    Wears 02 at home 2 L   Coronary artery disease    Heart valve replaced by other means    Hypercholesterolemia    Pure   Hypertension    Unspecified   LBBB (left bundle branch block)    Macular degeneration (senile) of retina, unspecified    On home O2    Postsurgical aortocoronary bypass status    Stroke (HCC)    Transient global amnesia    Unspecified hereditary and idiopathic peripheral neuropathy    Unspecified vitamin D deficiency     Past Surgical History:  Procedure Laterality Date   AORTIC VALVE REPLACEMENT     APPENDECTOMY      CORONARY ARTERY BYPASS GRAFT     ERCP N/A 09/13/2020   Procedure: ENDOSCOPIC RETROGRADE CHOLANGIOPANCREATOGRAPHY (ERCP);  Surgeon: Rachael Fee, MD;  Location: [facility] ENDOSCOPY;  Service: Endoscopy;  Laterality: N/A;   HIP PINNING,CANNULATED Right 07/18/2018   Procedure: RIGHT CANNULATED HIP PINNING;  Surgeon: Myrene Galas, MD;  Location: [facility] OR;  Service: Orthopedics;  Laterality: Right;   REMOVAL OF STONES  09/13/2020   Procedure: REMOVAL OF STONES;  Surgeon: Rachael Fee, MD;  Location: [facility] ENDOSCOPY;  Service: Endoscopy;;   SPHINCTEROTOMY  09/13/2020   Procedure: Dennison Mascot;  Surgeon: Rachael Fee, MD;  Location: [facility] ENDOSCOPY;  Service: Endoscopy;;   TONSILLECTOMY      Family History  Problem Relation Age of Onset   Heart attack Father    Heart disease Father    Stroke Father    Stroke Sister    Hypertension Mother    Stroke Mother    Neuropathy Brother    COPD Sister     Social History Social History   Tobacco Use   Smoking status: Former Smoker    Packs/day: 0.50    Types: Cigarettes    Quit date: 11/30/1991    Years since quitting: 28.8   Smokeless tobacco: Never Used   Tobacco comment: Tobacco use-no  Substance Use Topics   Alcohol use: No   Drug use: No    Allergies  Allergen Reactions  Codeine Nausea And Vomiting   Penicillins Other (See Comments)    Unknown reaction - listed on MAR Has patient had a PCN reaction causing immediate rash, facial/tongue/throat swelling, SOB or lightheadedness with hypotension: Unknown Has patient had a PCN reaction causing severe rash involving mucus membranes or skin necrosis: Unknown Has patient had a PCN reaction that required hospitalization: Unknown Has patient had a PCN reaction occurring within the last 10 years: Unknown If all of the above answers are "NO", then may proceed with Cephalosporin use.    Current Outpatient Medications  Medication Sig Dispense Refill   acetaminophen  (TYLENOL) 650 MG CR tablet Take 650 mg by mouth every 8 (eight) hours as needed for pain.     ALPRAZolam (XANAX) 0.25 MG tablet Take 0.125-0.25 mg by mouth at bedtime as needed for anxiety.      ANORO ELLIPTA 62.5-25 MCG/INH AEPB USE 1 INHALATION ONCE DAILY. 60 each 11   atorvastatin (LIPITOR) 40 MG tablet TAKE ONE TABLET BY MOUTH ONCE DAILY. 90 tablet 3   baclofen (LIORESAL) 10 MG tablet Take 10 mg by mouth as needed for muscle spasms.     carvedilol (COREG) 3.125 MG tablet Take 1 tablet (3.125 mg total) by mouth 2 (two) times daily with a meal. (Patient taking differently: Take 6.25 mg by mouth 2 (two) times daily with a meal. Pt takes 6.5 mg twice a day.) 180 tablet 3   docusate sodium (COLACE) 100 MG capsule Take 100 mg by mouth every evening.      donepezil (ARICEPT) 10 MG tablet TAKE ONE TABLET BY MOUTH ONCE DAILY. (Patient taking differently: Take 10 mg by mouth at bedtime. ) 30 tablet 0   ergocalciferol (VITAMIN D2) 50000 UNITS capsule Take 50,000 Units by mouth every Tuesday.      ferrous sulfate 325 (65 FE) MG EC tablet Take 325 mg by mouth daily.      furosemide (LASIX) 40 MG tablet TAKE ONE TABLET BY MOUTH ONCE DAILY. 90 tablet 3   HYDROcodone-acetaminophen (NORCO) 7.5-325 MG tablet Take 1 tablet by mouth every 6 (six) hours as needed for moderate pain.     hydrocortisone (ANUSOL-HC) 2.5 % rectal cream Place 1 application rectally 3 (three) times daily as needed for hemorrhoids or anal itching.     levalbuterol (XOPENEX) 0.63 MG/3ML nebulizer solution USE 1 VIAL VIA NEBULIZER EVERY 8 HOURS AS NEEDED. 144 mL 3   meclizine (ANTIVERT) 25 MG tablet Take 25 mg by mouth 3 (three) times daily as needed for dizziness.     Melatonin 3 MG TABS Take 1 tablet by mouth at bedtime.     methylPREDNISolone (MEDROL) 4 MG tablet Take 4 mg by mouth 2 (two) times daily as needed.     multivitamin-lutein (OCUVITE-LUTEIN) CAPS capsule Take 1 capsule by mouth daily.     ondansetron (ZOFRAN)  4 MG tablet Take 4 mg by mouth every 8 (eight) hours as needed for nausea or vomiting.     polyethylene glycol (MIRALAX / GLYCOLAX) 17 g packet Take 17 g by mouth daily as needed for moderate constipation.     pregabalin (LYRICA) 75 MG capsule TAKE 1 CAPSULE BY MOUTH EVERY OTHER DAY. 15 capsule 5   spironolactone (ALDACTONE) 25 MG tablet TAKE (1/2) TABLET BY MOUTH DAILY. (Patient taking differently: Take 12.5 mg by mouth daily. ) 45 tablet 6   traZODone (DESYREL) 50 MG tablet Take 0.5-1 tablets (25-50 mg total) by mouth at bedtime as needed for sleep. (Patient not taking: Reported  on 09/08/2020) 30 tablet 3   warfarin (COUMADIN) 5 MG tablet TAKE 1/2 TO 1 TABLET ONCE DAILY AS DIRECTED BY COUMADIN CLINIC. (Patient taking differently: Take 5 mg by mouth See admin instructions. Take 1/2 tablet on Monday, Wednesday and Friday and 1 whole tablet on Tues,Thursday, Saturday and Sunday.) 90 tablet 1   zinc sulfate 50 MG CAPS capsule Take 50 mg by mouth 2 (two) times daily.      No current facility-administered medications for this visit.     Physical Exam  Height 5\' 2"  (1.575 m), weight 115 lb (52.2 kg).  Constitutional: overall normal hygiene, normal nutrition, well developed, normal grooming, normal body habitus. Assistive device:cane  Musculoskeletal: gait and station Limp none, muscle tone and strength are normal, no tremors or atrophy is present.  .  Neurological: coordination overall normal.  Deep tendon reflex/nerve stretch intact.  Sensation abnormal.  Cranial nerves II-XII intact. Sensation of feet decreased.  Skin:   Normal overall no scars, lesions, ulcers or rashes. No psoriasis.  Psychiatric: Alert and oriented x 3.  Recent memory intact, remote memory unclear.  Normal mood and affect. Well groomed.  Good eye contact.  Cardiovascular: overall no swelling, no varicosities, no edema bilaterally, normal temperatures of the legs and arms, no clubbing, cyanosis and good capillary  refill.  Lymphatic: palpation is normal.  All other systems reviewed and are negative   The patient has been educated about the nature of the problem(s) and counseled on treatment options.  The patient appeared to understand what I have discussed and is in agreement with it.  Encounter Diagnosis  Name Primary?   Peripheral neuropathy due to inflammation Yes    PLAN Call if any problems.  Precautions discussed.  Continue current medications.   Return to clinic one year   Electronically Signed , MD 11/16/202110:27 AM

## 2020-10-01 ENCOUNTER — Encounter (INDEPENDENT_AMBULATORY_CARE_PROVIDER_SITE_OTHER): Payer: Self-pay | Admitting: Ophthalmology

## 2020-10-01 ENCOUNTER — Ambulatory Visit (INDEPENDENT_AMBULATORY_CARE_PROVIDER_SITE_OTHER): Payer: Medicare Other | Admitting: Ophthalmology

## 2020-10-01 DIAGNOSIS — H353212 Exudative age-related macular degeneration, right eye, with inactive choroidal neovascularization: Secondary | ICD-10-CM

## 2020-10-01 DIAGNOSIS — H353221 Exudative age-related macular degeneration, left eye, with active choroidal neovascularization: Secondary | ICD-10-CM

## 2020-10-01 MED ORDER — AFLIBERCEPT 2MG/0.05ML IZ SOLN FOR KALEIDOSCOPE
2.0000 mg | INTRAVITREAL | Status: AC | PRN
  Administered 2020-10-01: 2 mg via INTRAVITREAL

## 2020-10-01 NOTE — Progress Notes (Signed)
10/01/2020     CHIEF COMPLAINT Patient presents for Retina Follow Up   HISTORY OF PRESENT ILLNESS: [name] is a 86 y.o. female who presents to the clinic today for:   HPI    Retina Follow Up    Patient presents with  Wet AMD.  In left eye.  This started 7 weeks ago.  Severity is mild.  Duration of 7 weeks.  Since onset it is gradually worsening.          Comments    7 WK F/U OS, POSS EYLEA OS   Pt reports vision has worsened since last visit, no new F/F        Last edited by Varney Bilesrollinger, Lauren D on 10/01/2020  1:58 PM. (History)      Referring physician: Oval Linseyondiego, Richard, MD [address] [place],  [place] [id number]  HISTORICAL INFORMATION:   Selected notes from the MEDICAL RECORD NUMBER    Lab Results  Component Value Date   HGBA1C 5.7 (H) 10/27/2019     CURRENT MEDICATIONS: No current outpatient medications on file. (Ophthalmic Drugs)   No current facility-administered medications for this visit. (Ophthalmic Drugs)   Current Outpatient Medications (Other)  Medication Sig  . acetaminophen (TYLENOL) 650 MG CR tablet Take 650 mg by mouth every 8 (eight) hours as needed for pain.  Marland Kitchen. ALPRAZolam (XANAX) 0.25 MG tablet Take 0.125-0.25 mg by mouth at bedtime as needed for anxiety.   Ailene Ards. ANORO ELLIPTA 62.5-25 MCG/INH AEPB USE 1 INHALATION ONCE DAILY.  Marland Kitchen. atorvastatin (LIPITOR) 40 MG tablet TAKE ONE TABLET BY MOUTH ONCE DAILY.  . baclofen (LIORESAL) 10 MG tablet Take 10 mg by mouth as needed for muscle spasms.  . carvedilol (COREG) 3.125 MG tablet Take 1 tablet (3.125 mg total) by mouth 2 (two) times daily with a meal. (Patient taking differently: Take 6.25 mg by mouth 2 (two) times daily with a meal. Pt takes 6.5 mg twice a day.)  . docusate sodium (COLACE) 100 MG capsule Take 100 mg by mouth every evening.   . donepezil (ARICEPT) 10 MG tablet TAKE ONE TABLET BY MOUTH ONCE DAILY. (Patient taking differently: Take 10 mg by mouth at bedtime. )  . ergocalciferol  (VITAMIN D2) 50000 UNITS capsule Take 50,000 Units by mouth every Tuesday.   . ferrous sulfate 325 (65 FE) MG EC tablet Take 325 mg by mouth daily.   . furosemide (LASIX) 40 MG tablet TAKE ONE TABLET BY MOUTH ONCE DAILY.  Marland Kitchen. HYDROcodone-acetaminophen (NORCO) 7.5-325 MG tablet Take 1 tablet by mouth every 6 (six) hours as needed for moderate pain.  . hydrocortisone (ANUSOL-HC) 2.5 % rectal cream Place 1 application rectally 3 (three) times daily as needed for hemorrhoids or anal itching.  . levalbuterol (XOPENEX) 0.63 MG/3ML nebulizer solution USE 1 VIAL VIA NEBULIZER EVERY 8 HOURS AS NEEDED.  Marland Kitchen. meclizine (ANTIVERT) 25 MG tablet Take 25 mg by mouth 3 (three) times daily as needed for dizziness.  . Melatonin 3 MG TABS Take 1 tablet by mouth at bedtime.  . methylPREDNISolone (MEDROL) 4 MG tablet Take 4 mg by mouth 2 (two) times daily as needed.  . multivitamin-lutein (OCUVITE-LUTEIN) CAPS capsule Take 1 capsule by mouth daily.  . ondansetron (ZOFRAN) 4 MG tablet Take 4 mg by mouth every 8 (eight) hours as needed for nausea or vomiting.  . polyethylene glycol (MIRALAX / GLYCOLAX) 17 g packet Take 17 g by mouth daily as needed for moderate constipation.  . pregabalin (LYRICA) 75  MG capsule TAKE 1 CAPSULE BY MOUTH EVERY OTHER DAY.  Marland Kitchen spironolactone (ALDACTONE) 25 MG tablet TAKE (1/2) TABLET BY MOUTH DAILY. (Patient taking differently: Take 12.5 mg by mouth daily. )  . traZODone (DESYREL) 50 MG tablet Take 0.5-1 tablets (25-50 mg total) by mouth at bedtime as needed for sleep. (Patient not taking: Reported on 09/08/2020)  . warfarin (COUMADIN) 5 MG tablet TAKE 1/2 TO 1 TABLET ONCE DAILY AS DIRECTED BY COUMADIN CLINIC. (Patient taking differently: Take 5 mg by mouth See admin instructions. Take 1/2 tablet on Monday, Wednesday and Friday and 1 whole tablet on Tues,Thursday, Saturday and Sunday.)  . zinc sulfate 50 MG CAPS capsule Take 50 mg by mouth 2 (two) times daily.    No current facility-administered  medications for this visit. (Other)      REVIEW OF SYSTEMS:    ALLERGIES Allergies  Allergen Reactions  . Codeine Nausea And Vomiting  . Penicillins Other (See Comments)    Unknown reaction - listed on MAR Has patient had a PCN reaction causing immediate rash, facial/tongue/throat swelling, SOB or lightheadedness with hypotension: Unknown Has patient had a PCN reaction causing severe rash involving mucus membranes or skin necrosis: Unknown Has patient had a PCN reaction that required hospitalization: Unknown Has patient had a PCN reaction occurring within the last 10 years: Unknown If all of the above answers are "NO", then may proceed with Cephalosporin use.    PAST MEDICAL HISTORY Past Medical History:  Diagnosis Date  . Anemia   . Anxiety   . Arthritis   . Blood transfusion without reported diagnosis   . Carotid artery stenosis    Without infarction  . Cataract   . Chronic combined systolic (congestive) and diastolic (congestive) heart failure (HCC)   . COPD (chronic obstructive pulmonary disease) (HCC)    Wears 02 at home 2 L  . Coronary artery disease   . Heart valve replaced by other means   . Hypercholesterolemia    Pure  . Hypertension    Unspecified  . LBBB (left bundle branch block)   . Macular degeneration (senile) of retina, unspecified   . On home O2   . Postsurgical aortocoronary bypass status   . Stroke (HCC)   . Transient global amnesia   . Unspecified hereditary and idiopathic peripheral neuropathy   . Unspecified vitamin D deficiency    Past Surgical History:  Procedure Laterality Date  . AORTIC VALVE REPLACEMENT    . APPENDECTOMY    . CORONARY ARTERY BYPASS GRAFT    . ERCP N/A 09/13/2020   Procedure: ENDOSCOPIC RETROGRADE CHOLANGIOPANCREATOGRAPHY (ERCP);  Surgeon: Rachael Fee, MD;  Location: [facility] ENDOSCOPY;  Service: Endoscopy;  Laterality: N/A;  . HIP PINNING,CANNULATED Right 07/18/2018   Procedure: RIGHT CANNULATED HIP PINNING;  Surgeon:  Myrene Galas, MD;  Location: [facility] OR;  Service: Orthopedics;  Laterality: Right;  . REMOVAL OF STONES  09/13/2020   Procedure: REMOVAL OF STONES;  Surgeon: Rachael Fee, MD;  Location: [facility] ENDOSCOPY;  Service: Endoscopy;;  . Dennison Mascot  09/13/2020   Procedure: Dennison Mascot;  Surgeon: Rachael Fee, MD;  Location[facility] END[facility]COPY;  Service: Endoscopy;;  . TONSILLECTOMY      FAMILY HISTORY Family History  Problem Relation Age of Onset  . Heart attack Father   . Heart disease Father   . Stroke Father   . Stroke Sister   . Hypertension Mother   . Stroke Mother   . Neuropathy Brother   . COPD Sister  SOCIAL HISTORY Social History   Tobacco Use  . Smoking status: Former Smoker    Packs/day: 0.50    Types: Cigarettes    Quit date: 11/30/1991    Years since quitting: 28.8  . Smokeless tobacco: Never Used  . Tobacco comment: Tobacco use-no  Substance Use Topics  . Alcohol use: No  . Drug use: No         OPHTHALMIC EXAM:  Base Eye Exam    Visual Acuity (ETDRS)      Right Left   Dist cc 20/80 -2 20/100 +1   Dist ph cc NI NI   Correction: Glasses       Tonometry (Tonopen, 2:08 PM)      Right Left   Pressure 14 8       Pupils      Pupils Dark Light Shape React APD   Right PERRL 1 1 Round Minimal None   Left PERRL 1 1 Round Minimal None       Visual Fields (Counting fingers)      Left Right   Restrictions Partial outer superior nasal deficiency        Extraocular Movement      Right Left    Full Full       Neuro/Psych    Oriented x3: Yes   Mood/Affect: Normal       Dilation    Left eye: 1.0% Mydriacyl, 2.5% Phenylephrine @ 2:10 PM        Slit Lamp and Fundus Exam    External Exam      Right Left   External Normal Normal       Slit Lamp Exam      Right Left   Lids/Lashes Normal Normal   Conjunctiva/Sclera White and quiet White and quiet   Cornea Clear Clear   Anterior Chamber Deep and quiet Deep and quiet   Iris Round and  reactive Round and reactive   Lens Clear Centered posterior chamber intraocular lens   Anterior Vitreous Normal Normal       Fundus Exam      Right Left   Posterior Vitreous  Posterior vitreous detachment, Central vitreous floaters   Disc  Normal, collateral vessels on the nerve   C/D Ratio  0.3   Macula Normal Subretinal hemorrhage, Macular thickening   Vessels  Normal   Periphery Normal Normal          IMAGING AND PROCEDURES  Imaging and Procedures for 10/01/20  OCT, Retina - OU - Both Eyes       Right Eye Quality was good. Scan locations included subfoveal. Central Foveal Thickness: 255. Progression has been stable.   Left Eye Quality was good. Scan locations included subfoveal. Central Foveal Thickness: 494. Progression has improved. Findings include subretinal hyper-reflective material, subretinal fluid, intraretinal fluid, abnormal foveal contour, disciform scar, cystoid macular edema.   Notes OS with less subretinal fluid, CNVM activity, repeat injection Eylea today at 7-week interval       Intravitreal Injection, Pharmacologic Agent - OS - Left Eye       Time Out 10/01/2020. 2:52 PM. Confirmed correct patient, procedure, site, and patient consented.   Anesthesia Topical anesthesia was used.   Procedure Preparation included Tobramycin 0.3%. A 30 gauge needle was used.   Injection:  2 mg aflibercept Gretta Cool) SOLN   NDC: L6038910, Lot: [id number]   Route: Intravitreal, Site: Left Eye, Waste: 0 mg  Post-op Post injection exam found visual acuity of at least counting  fingers. The patient tolerated the procedure well. There were no complications. The patient received written and verbal post procedure care education. Post injection medications were not given.                 ASSESSMENT/PLAN:  Exudative age-related macular degeneration of left eye with active choroidal neovascularization (HCC) CNVM subfoveal OS slightly less active at 7-week  follow-up today repeat Eylea injection today and examination in 6 to 7 weeks  Exudative age-related macular degeneration of right eye with inactive choroidal neovascularization (HCC) No OCT signs of reactivation of CNVM OD      ICD-10-CM   1. Exudative age-related macular degeneration of left eye with active choroidal neovascularization (HCC)  H35.3221 OCT, Retina - OU - Both Eyes    Intravitreal Injection, Pharmacologic Agent - OS - Left Eye    aflibercept (EYLEA) SOLN 2 mg    CANCELED: OCT, Retina - OU - Both Eyes  2. Exudative age-related macular degeneration of right eye with inactive choroidal neovascularization (HCC)  H35.3212     1.  Repeat intravitreal Eylea OS today and examination in 7 weeks  2.  3.  Ophthalmic Meds Ordered this visit:  Meds ordered this encounter  Medications  . aflibercept (EYLEA) SOLN 2 mg       Return in about 7 weeks (around 11/19/2020) for dilate, OS, EYLEA OCT.  There are no Patient Instructions on file for this visit.   Explained the diagnoses, plan, and follow up with the patient and they expressed understanding.  Patient expressed understanding of the importance of proper follow up care.   Alford Highland Darriel Utter M.D. Diseases & Surgery of the Retina and Vitreous [facility] 10/01/20     Abbreviations: M myopia (nearsighted); A astigmatism; H hyperopia (farsighted); P presbyopia; Mrx spectacle prescription;  CTL contact lenses; OD right eye; OS left eye; OU both eyes  XT exotropia; ET esotropia; PEK punctate epithelial keratitis; PEE punctate epithelial erosions; DES dry eye syndrome; MGD meibomian gland dysfunction; ATs artificial tears; PFAT's preservative free artificial tears; NSC nuclear sclerotic cataract; PSC posterior subcapsular cataract; ERM epi-retinal membrane; PVD posterior vitreous detachment; RD retinal detachment; DM diabetes mellitus; DR diabetic retinopathy; NPDR non-proliferative diabetic retinopathy; PDR  proliferative diabetic retinopathy; CSME clinically significant macular edema; DME diabetic macular edema; dbh dot blot hemorrhages; CWS cotton wool spot; POAG primary open angle glaucoma; C/D cup-to-disc ratio; HVF humphrey visual field; GVF goldmann visual field; OCT optical coherence tomography; IOP intraocular pressure; BRVO Branch retinal vein occlusion; CRVO central retinal vein occlusion; CRAO central retinal artery occlusion; BRAO branch retinal artery occlusion; RT retinal tear; SB scleral buckle; PPV pars plana vitrectomy; VH Vitreous hemorrhage; PRP panretinal laser photocoagulation; IVK intravitreal kenalog; VMT vitreomacular traction; MH Macular hole;  NVD neovascularization of the disc; NVE neovascularization elsewhere; AREDS age related eye disease study; ARMD age related macular degeneration; POAG primary open angle glaucoma; EBMD epithelial/anterior basement membrane dystrophy; ACIOL anterior chamber intraocular lens; IOL intraocular lens; PCIOL posterior chamber intraocular lens; Phaco/IOL phacoemulsification with intraocular lens placement; PRK photorefractive keratectomy; LASIK laser assisted in situ keratomileusis; HTN hypertension; DM diabetes mellitus; COPD chronic obstructive pulmonary disease

## 2020-10-01 NOTE — Assessment & Plan Note (Signed)
CNVM subfoveal OS slightly less active at 7-week follow-up today repeat Eylea injection today and examination in 6 to 7 weeks

## 2020-10-01 NOTE — Assessment & Plan Note (Signed)
No OCT signs of reactivation of CNVM OD

## 2020-10-02 ENCOUNTER — Ambulatory Visit (INDEPENDENT_AMBULATORY_CARE_PROVIDER_SITE_OTHER): Payer: Medicare Other | Admitting: Pharmacist

## 2020-10-02 DIAGNOSIS — Z5181 Encounter for therapeutic drug level monitoring: Secondary | ICD-10-CM

## 2020-10-02 DIAGNOSIS — N1832 Chronic kidney disease, stage 3b: Secondary | ICD-10-CM | POA: Diagnosis not present

## 2020-10-02 DIAGNOSIS — K8050 Calculus of bile duct without cholangitis or cholecystitis without obstruction: Secondary | ICD-10-CM | POA: Diagnosis not present

## 2020-10-02 DIAGNOSIS — I5042 Chronic combined systolic (congestive) and diastolic (congestive) heart failure: Secondary | ICD-10-CM | POA: Diagnosis not present

## 2020-10-02 DIAGNOSIS — Z952 Presence of prosthetic heart valve: Secondary | ICD-10-CM | POA: Diagnosis not present

## 2020-10-02 DIAGNOSIS — I359 Nonrheumatic aortic valve disorder, unspecified: Secondary | ICD-10-CM

## 2020-10-02 DIAGNOSIS — N179 Acute kidney failure, unspecified: Secondary | ICD-10-CM | POA: Diagnosis not present

## 2020-10-02 DIAGNOSIS — I130 Hypertensive heart and chronic kidney disease with heart failure and stage 1 through stage 4 chronic kidney disease, or unspecified chronic kidney disease: Secondary | ICD-10-CM | POA: Diagnosis not present

## 2020-10-02 DIAGNOSIS — I6350 Cerebral infarction due to unspecified occlusion or stenosis of unspecified cerebral artery: Secondary | ICD-10-CM

## 2020-10-02 DIAGNOSIS — J449 Chronic obstructive pulmonary disease, unspecified: Secondary | ICD-10-CM | POA: Diagnosis not present

## 2020-10-02 LAB — POCT INR: INR: 2.3 (ref 2.0–3.0)

## 2020-10-03 ENCOUNTER — Other Ambulatory Visit: Payer: Self-pay

## 2020-10-03 ENCOUNTER — Other Ambulatory Visit: Payer: Medicare Other

## 2020-10-03 VITALS — BP 138/40 | HR 57 | Temp 99.0°F | Resp 20 | Wt 111.0 lb

## 2020-10-03 DIAGNOSIS — Z515 Encounter for palliative care: Secondary | ICD-10-CM

## 2020-10-03 NOTE — Progress Notes (Signed)
PATIENT NAME: [name] °DOB: 01/06/1926 °MRN: [id number] ° °PRIMARY CARE PROVIDER: Dondiego, Richard, MD ° °RESPONSIBLE PARTY:  °Acct ID - Guarantor Home Phone Work Phone Relationship Acct Type  °[id number] - [place]* [phone]  Self P/F  °   [address], [place], [place] [address]  ° ° °PLAN OF CARE and INTERVENTIONS: °              1.  GOALS OF CARE/ ADVANCE CARE PLANNING:  Remain in home with caregiver. °              2.  PATIENT/CAREGIVER EDUCATION:  Daily weights and Lasix °              4. PERSONAL EMERGENCY PLAN:  Caregiver will activate 911 for emergencies. °              5.  DISEASE STATUS:  Greeted at the door by [name]-caregiver.  Patient enters the living room after completing personal care.   ° °Follow up visit completed after patient completed a hospital stay in October due to cholelithiasis.  Patient recalls her hospitalization and states her stay went very well.  She is pleased to be back home and has resumed her normal activities.  Patient goes out daily with her caregiver for lunch.  They often run errands together and note this week has been busy due to follow up appointments. ° °Patient denies any shortness of breath or pain.  She has some occasional dizziness but states this is resolved with rest for a few seconds after standing.  She notes this has improved since the decrease of her coreg.  No complaints of chest pain, nausea or vomiting. ° °Patient's weight is maintaining around 110-111 lbs.  She did take an extra lasix this am due to weighing 111 lbs.  Her caregiver is managing daily weights and provides an extra lasix when needed. ° °Overall, patient feels well and caregiver reports patient is doing well.  Her appetite has been fair to good which is consistent with my last visit.  Re-enforced daily weights and use of lasix.  Patient and caregiver are aware they can call Palliative Care should a visit be needed before next schedule visit in December.   ° ° °HISTORY OF PRESENT  ILLNESS:  86 year old female with recent hospitalization for cholelithiasis, hx of COPD and heart failure.  Patient is being followed by Palliative Care monthly and PRN. ° °CODE STATUS: DNR °ADVANCED DIRECTIVES: N °MOST FORM: No °PPS: 50% ° ° °PHYSICAL EXAM:  ° °VITALS:  BP 138/40 P 57 R 20 O2 sats 99% on 4 L via [place]. °LUNGS: CTA °CARDIAC: HRR °EXTREMITIES: trace edema to bilateral lower extremities. °SKIN: + for dry skin but no skin breakdown noted. °NEURO: Alert and oriented x 3 some forgetfulness present. ° ° ° ° ° ° ° A , RN °

## 2020-10-07 DIAGNOSIS — I5042 Chronic combined systolic (congestive) and diastolic (congestive) heart failure: Secondary | ICD-10-CM | POA: Diagnosis not present

## 2020-10-07 DIAGNOSIS — I130 Hypertensive heart and chronic kidney disease with heart failure and stage 1 through stage 4 chronic kidney disease, or unspecified chronic kidney disease: Secondary | ICD-10-CM | POA: Diagnosis not present

## 2020-10-07 DIAGNOSIS — K8050 Calculus of bile duct without cholangitis or cholecystitis without obstruction: Secondary | ICD-10-CM | POA: Diagnosis not present

## 2020-10-07 DIAGNOSIS — N179 Acute kidney failure, unspecified: Secondary | ICD-10-CM | POA: Diagnosis not present

## 2020-10-07 DIAGNOSIS — N1832 Chronic kidney disease, stage 3b: Secondary | ICD-10-CM | POA: Diagnosis not present

## 2020-10-07 DIAGNOSIS — J449 Chronic obstructive pulmonary disease, unspecified: Secondary | ICD-10-CM | POA: Diagnosis not present

## 2020-10-14 DIAGNOSIS — I130 Hypertensive heart and chronic kidney disease with heart failure and stage 1 through stage 4 chronic kidney disease, or unspecified chronic kidney disease: Secondary | ICD-10-CM | POA: Diagnosis not present

## 2020-10-14 DIAGNOSIS — N179 Acute kidney failure, unspecified: Secondary | ICD-10-CM | POA: Diagnosis not present

## 2020-10-14 DIAGNOSIS — D500 Iron deficiency anemia secondary to blood loss (chronic): Secondary | ICD-10-CM | POA: Diagnosis not present

## 2020-10-14 DIAGNOSIS — J449 Chronic obstructive pulmonary disease, unspecified: Secondary | ICD-10-CM | POA: Diagnosis not present

## 2020-10-14 DIAGNOSIS — N1832 Chronic kidney disease, stage 3b: Secondary | ICD-10-CM | POA: Diagnosis not present

## 2020-10-14 DIAGNOSIS — D513 Other dietary vitamin B12 deficiency anemia: Secondary | ICD-10-CM | POA: Diagnosis not present

## 2020-10-14 DIAGNOSIS — K8050 Calculus of bile duct without cholangitis or cholecystitis without obstruction: Secondary | ICD-10-CM | POA: Diagnosis not present

## 2020-10-14 DIAGNOSIS — I5042 Chronic combined systolic (congestive) and diastolic (congestive) heart failure: Secondary | ICD-10-CM | POA: Diagnosis not present

## 2020-10-14 DIAGNOSIS — E559 Vitamin D deficiency, unspecified: Secondary | ICD-10-CM | POA: Diagnosis not present

## 2020-10-14 DIAGNOSIS — D510 Vitamin B12 deficiency anemia due to intrinsic factor deficiency: Secondary | ICD-10-CM | POA: Diagnosis not present

## 2020-10-14 DIAGNOSIS — I110 Hypertensive heart disease with heart failure: Secondary | ICD-10-CM | POA: Diagnosis not present

## 2020-10-16 ENCOUNTER — Ambulatory Visit (INDEPENDENT_AMBULATORY_CARE_PROVIDER_SITE_OTHER): Payer: Medicare Other | Admitting: Pharmacist

## 2020-10-16 DIAGNOSIS — I6350 Cerebral infarction due to unspecified occlusion or stenosis of unspecified cerebral artery: Secondary | ICD-10-CM

## 2020-10-16 DIAGNOSIS — Z952 Presence of prosthetic heart valve: Secondary | ICD-10-CM

## 2020-10-16 DIAGNOSIS — Z5181 Encounter for therapeutic drug level monitoring: Secondary | ICD-10-CM | POA: Diagnosis not present

## 2020-10-16 DIAGNOSIS — N1832 Chronic kidney disease, stage 3b: Secondary | ICD-10-CM | POA: Diagnosis not present

## 2020-10-16 DIAGNOSIS — K8050 Calculus of bile duct without cholangitis or cholecystitis without obstruction: Secondary | ICD-10-CM | POA: Diagnosis not present

## 2020-10-16 DIAGNOSIS — J449 Chronic obstructive pulmonary disease, unspecified: Secondary | ICD-10-CM | POA: Diagnosis not present

## 2020-10-16 DIAGNOSIS — I130 Hypertensive heart and chronic kidney disease with heart failure and stage 1 through stage 4 chronic kidney disease, or unspecified chronic kidney disease: Secondary | ICD-10-CM | POA: Diagnosis not present

## 2020-10-16 DIAGNOSIS — N179 Acute kidney failure, unspecified: Secondary | ICD-10-CM | POA: Diagnosis not present

## 2020-10-16 DIAGNOSIS — I5042 Chronic combined systolic (congestive) and diastolic (congestive) heart failure: Secondary | ICD-10-CM | POA: Diagnosis not present

## 2020-10-16 DIAGNOSIS — I359 Nonrheumatic aortic valve disorder, unspecified: Secondary | ICD-10-CM

## 2020-10-16 LAB — POCT INR: INR: 3 (ref 2.0–3.0)

## 2020-10-18 ENCOUNTER — Ambulatory Visit: Payer: Medicare Other | Attending: Internal Medicine

## 2020-10-18 DIAGNOSIS — Z23 Encounter for immunization: Secondary | ICD-10-CM

## 2020-10-18 NOTE — Progress Notes (Signed)
   Covid-19 Vaccination Clinic  Name:  [name]    MRN: [id number] DOB: [birth year]  10/18/2020  [name] was observed post Covid-19 immunization for 15 minutes without incident. She was provided with Vaccine Information Sheet and instruction to access the V-Safe system.   [name] was instructed to call 911 with any severe reactions post vaccine: Marland Kitchen Difficulty breathing  . Swelling of face and throat  . A fast heartbeat  . A bad rash all over body  . Dizziness and weakness   Immunizations Administered    Name Date Dose VIS Date Route   Pfizer COVID-19 Vaccine 10/18/2020 10:39 AM 0.3 mL 09/03/2020 Intramuscular   Manufacturer: [employer], [employer]   Lot: [id number]   NDC: [address]-5

## 2020-10-20 DIAGNOSIS — N1832 Chronic kidney disease, stage 3b: Secondary | ICD-10-CM | POA: Diagnosis not present

## 2020-10-20 DIAGNOSIS — J449 Chronic obstructive pulmonary disease, unspecified: Secondary | ICD-10-CM | POA: Diagnosis not present

## 2020-10-20 DIAGNOSIS — I130 Hypertensive heart and chronic kidney disease with heart failure and stage 1 through stage 4 chronic kidney disease, or unspecified chronic kidney disease: Secondary | ICD-10-CM | POA: Diagnosis not present

## 2020-10-20 DIAGNOSIS — I5042 Chronic combined systolic (congestive) and diastolic (congestive) heart failure: Secondary | ICD-10-CM | POA: Diagnosis not present

## 2020-10-20 DIAGNOSIS — K8050 Calculus of bile duct without cholangitis or cholecystitis without obstruction: Secondary | ICD-10-CM | POA: Diagnosis not present

## 2020-10-20 DIAGNOSIS — N179 Acute kidney failure, unspecified: Secondary | ICD-10-CM | POA: Diagnosis not present

## 2020-10-21 DIAGNOSIS — K219 Gastro-esophageal reflux disease without esophagitis: Secondary | ICD-10-CM | POA: Diagnosis not present

## 2020-10-21 DIAGNOSIS — E559 Vitamin D deficiency, unspecified: Secondary | ICD-10-CM | POA: Diagnosis not present

## 2020-10-21 DIAGNOSIS — M2550 Pain in unspecified joint: Secondary | ICD-10-CM | POA: Diagnosis not present

## 2020-10-21 DIAGNOSIS — E7849 Other hyperlipidemia: Secondary | ICD-10-CM | POA: Diagnosis not present

## 2020-10-23 DIAGNOSIS — I5042 Chronic combined systolic (congestive) and diastolic (congestive) heart failure: Secondary | ICD-10-CM | POA: Diagnosis not present

## 2020-10-23 DIAGNOSIS — I130 Hypertensive heart and chronic kidney disease with heart failure and stage 1 through stage 4 chronic kidney disease, or unspecified chronic kidney disease: Secondary | ICD-10-CM | POA: Diagnosis not present

## 2020-10-23 DIAGNOSIS — E559 Vitamin D deficiency, unspecified: Secondary | ICD-10-CM | POA: Diagnosis not present

## 2020-10-23 DIAGNOSIS — F0390 Unspecified dementia without behavioral disturbance: Secondary | ICD-10-CM | POA: Diagnosis not present

## 2020-10-23 DIAGNOSIS — J449 Chronic obstructive pulmonary disease, unspecified: Secondary | ICD-10-CM | POA: Diagnosis not present

## 2020-10-23 DIAGNOSIS — Z7901 Long term (current) use of anticoagulants: Secondary | ICD-10-CM | POA: Diagnosis not present

## 2020-10-23 DIAGNOSIS — K8050 Calculus of bile duct without cholangitis or cholecystitis without obstruction: Secondary | ICD-10-CM | POA: Diagnosis not present

## 2020-10-23 DIAGNOSIS — N179 Acute kidney failure, unspecified: Secondary | ICD-10-CM | POA: Diagnosis not present

## 2020-10-23 DIAGNOSIS — Z48815 Encounter for surgical aftercare following surgery on the digestive system: Secondary | ICD-10-CM | POA: Diagnosis not present

## 2020-10-23 DIAGNOSIS — G454 Transient global amnesia: Secondary | ICD-10-CM | POA: Diagnosis not present

## 2020-10-23 DIAGNOSIS — D649 Anemia, unspecified: Secondary | ICD-10-CM | POA: Diagnosis not present

## 2020-10-23 DIAGNOSIS — I4581 Long QT syndrome: Secondary | ICD-10-CM | POA: Diagnosis not present

## 2020-10-23 DIAGNOSIS — I2510 Atherosclerotic heart disease of native coronary artery without angina pectoris: Secondary | ICD-10-CM | POA: Diagnosis not present

## 2020-10-23 DIAGNOSIS — Z9981 Dependence on supplemental oxygen: Secondary | ICD-10-CM | POA: Diagnosis not present

## 2020-10-23 DIAGNOSIS — I447 Left bundle-branch block, unspecified: Secondary | ICD-10-CM | POA: Diagnosis not present

## 2020-10-23 DIAGNOSIS — Z8673 Personal history of transient ischemic attack (TIA), and cerebral infarction without residual deficits: Secondary | ICD-10-CM | POA: Diagnosis not present

## 2020-10-23 DIAGNOSIS — Z952 Presence of prosthetic heart valve: Secondary | ICD-10-CM | POA: Diagnosis not present

## 2020-10-23 DIAGNOSIS — I6529 Occlusion and stenosis of unspecified carotid artery: Secondary | ICD-10-CM | POA: Diagnosis not present

## 2020-10-23 DIAGNOSIS — F419 Anxiety disorder, unspecified: Secondary | ICD-10-CM | POA: Diagnosis not present

## 2020-10-23 DIAGNOSIS — N1832 Chronic kidney disease, stage 3b: Secondary | ICD-10-CM | POA: Diagnosis not present

## 2020-10-23 DIAGNOSIS — E785 Hyperlipidemia, unspecified: Secondary | ICD-10-CM | POA: Diagnosis not present

## 2020-10-23 DIAGNOSIS — Z9181 History of falling: Secondary | ICD-10-CM | POA: Diagnosis not present

## 2020-10-28 DIAGNOSIS — K8050 Calculus of bile duct without cholangitis or cholecystitis without obstruction: Secondary | ICD-10-CM | POA: Diagnosis not present

## 2020-10-28 DIAGNOSIS — N179 Acute kidney failure, unspecified: Secondary | ICD-10-CM | POA: Diagnosis not present

## 2020-10-28 DIAGNOSIS — I130 Hypertensive heart and chronic kidney disease with heart failure and stage 1 through stage 4 chronic kidney disease, or unspecified chronic kidney disease: Secondary | ICD-10-CM | POA: Diagnosis not present

## 2020-10-28 DIAGNOSIS — I5042 Chronic combined systolic (congestive) and diastolic (congestive) heart failure: Secondary | ICD-10-CM | POA: Diagnosis not present

## 2020-10-28 DIAGNOSIS — N1832 Chronic kidney disease, stage 3b: Secondary | ICD-10-CM | POA: Diagnosis not present

## 2020-10-28 DIAGNOSIS — J449 Chronic obstructive pulmonary disease, unspecified: Secondary | ICD-10-CM | POA: Diagnosis not present

## 2020-10-29 ENCOUNTER — Other Ambulatory Visit: Payer: Medicare Other

## 2020-10-29 ENCOUNTER — Other Ambulatory Visit: Payer: Self-pay

## 2020-10-29 VITALS — BP 128/42 | HR 65 | Temp 99.3°F | Wt 110.0 lb

## 2020-10-29 DIAGNOSIS — Z515 Encounter for palliative care: Secondary | ICD-10-CM

## 2020-10-29 NOTE — Progress Notes (Signed)
PATIENT NAME: [name] DOB: Aug 20, 1926 MRN: [id number]  PRIMARY CARE PROVIDER: Lucia Gaskins, MD  RESPONSIBLE PARTY:  Acct ID - Guarantor Home Phone Work Phone Relationship Acct Type  [id number] [name]  Self P/F     [address], [place], [place] [address]    PLAN OF CARE and INTERVENTIONS:               1.  GOALS OF CARE/ ADVANCE CARE PLANNING:  Remain at home under the care of her private sitter.               2.  PATIENT/CAREGIVER EDUCATION: Discussion over use of breathing treatments.               4. PERSONAL EMERGENCY PLAN:  Activate 911 for emergencies.               5.  DISEASE STATUS:  Greeted at the door by caregiver [name].  Patient is found in the living room.  She is able to provide an update with the assistance of [name].  Patient received her COVID 19 booster shot a week ago last Saturday.  She was feeling unwell for most of last week.  Noted shortness of breath, fatigue, decrease po intake and increase in sleepiness.   Patient states her po intake has improved.  Her weight had decreased to 109 lbs but is back up to 110 lbs now.   [name] is monitoring weight and continues to administer and extra lasix if needed.  Wheezing is noted with assessment of respiratory status.  I have discussed use of duonebs with caregiver for wheezing and shortness of breath.  Patient was given 3 treatments on Saturday due to shortness of breath.    Patient and caregiver note changes in bowel movements since patient was hospitalized in October for choledocholithiasis.  Patient describes this as diarrhea but caregiver states movements are more pudding consistency than diarrhea. We have discussed use of prn imodium for diarrhea should patient require this.  No other issues are noted.  Patient talks about her upcoming birthday, Christmas and activities she participates in daily.  She feels she is doing well overall and is improving from the booster shot last week.     HISTORY OF  PRESENT ILLNESS:  86 year old female with hx of COPD and CHF.  Patient is being followed by Palliative Care monthly and PRN.  CODE STATUS: DNR  ADVANCED DIRECTIVES: No MOST FORM: No PPS: 50%   PHYSICAL EXAM:   VITALS: Today's Vitals   10/29/20 0913  BP: (!) 128/42  Pulse: 65  Temp: 99.3 F (37.4 C)  SpO2: 99%  PainSc: 0-No pain    LUNGS:  Slight wheezing present to bilateral upper lobes.   CARDIAC: HRR EXTREMITIES:  No edema present. SKIN: Warm and dry to touch.  No skin breakdown. NEURO:  Alert and oriented x 3. + for forgetfulness       Lorenza Burton, RN

## 2020-10-29 NOTE — Progress Notes (Signed)
COMMUNITY PALLIATIVE CARE SW NOTE  PATIENT NAME: [name] DOB: 26-Aug-1926 MRN: [id number]  PRIMARY CARE PROVIDER: Lucia Gaskins, MD  RESPONSIBLE PARTY:  Acct ID - Guarantor Home Phone Work Phone Relationship Acct Type  [id number] - [name],[phone]  Self P/F     [name], [place], [place] [address]     PLAN OF CARE and INTERVENTIONS:             1. GOALS OF CARE/ ADVANCE CARE PLANNING:  Patient is a DNR. Patient's goal is to remain at home as independent as possible. 2.         SOCIAL/EMOTIONAL/SPIRITUAL ASSESSMENT/ INTERVENTIONS:  SW and RN [name] met with patient and patient's live-in caregiver, [name], in patients home. Patient lives in a one story home. Patient has chronic COPD and respiratory failure. Patient and caregiver updated SW and RN medical condition and changes. Patient had gallbladder removed in Oct. Patient states that she has not been feeling well the past few days (not eating, diarrhea, and sleeping more), but is feeling better today. Patient has had he booster shot. Patient continues on 4LPM as needed. Patient shared that she and he caregiver continue to go out daily for lunch and that she goes to local gym M,W,F. No falls reported. No pain reported. No recent medication changes. Patient's caregiver checks Coumadin at home. Caregiver also takes patients vitals and weights daily, weights have been stable. RN reviewed medications and took vitals. SW discussed goals, reviewed care plan, provided emotional support, used active and reflective listening. Palliative care will continue to monitor and assist with long term care planning as needed.  3.         PATIENT/CAREGIVER EDUCATION/ COPING:  Patient A&O x3, Pt is HOH but able to answer questions appropriately. Patient is HOH. Patient denies any anxiety or depression, patient takes xanax. Patients family is supportive 4.         PERSONAL EMERGENCY PLAN:  Patient will call 9-1-1 for emergencies.  5.         COMMUNITY  RESOURCES COORDINATION/ HEALTH CARE NAVIGATION:  Patient and patients daughter manages her care. 6.         FINANCIAL/LEGAL CONCERNS/INTERVENTIONS:  None.     SOCIAL HX:  Social History   Tobacco Use  . Smoking status: Former Smoker    Packs/day: 0.50    Types: Cigarettes    Quit date: 11/30/1991    Years since quitting: 28.9  . Smokeless tobacco: Never Used  . Tobacco comment: Tobacco use-no  Substance Use Topics  . Alcohol use: No    CODE STATUS: DNR ADVANCED DIRECTIVES: Y MOST FORM COMPLETE: N HOSPICE EDUCATION PROVIDED: N  PPS: Patient is independent with all ADL's. Caregiver prepares meals and drives patient to appointments and run errands.    Time spent: 45 min       [place], [place]

## 2020-10-30 ENCOUNTER — Ambulatory Visit (INDEPENDENT_AMBULATORY_CARE_PROVIDER_SITE_OTHER): Payer: Medicare Other | Admitting: Pharmacist

## 2020-10-30 DIAGNOSIS — Z5181 Encounter for therapeutic drug level monitoring: Secondary | ICD-10-CM

## 2020-10-30 DIAGNOSIS — I359 Nonrheumatic aortic valve disorder, unspecified: Secondary | ICD-10-CM

## 2020-10-30 DIAGNOSIS — Z952 Presence of prosthetic heart valve: Secondary | ICD-10-CM

## 2020-10-30 DIAGNOSIS — I6350 Cerebral infarction due to unspecified occlusion or stenosis of unspecified cerebral artery: Secondary | ICD-10-CM

## 2020-10-30 LAB — POCT INR: INR: 3.3 — AB (ref 2.0–3.0)

## 2020-11-03 ENCOUNTER — Ambulatory Visit (INDEPENDENT_AMBULATORY_CARE_PROVIDER_SITE_OTHER): Payer: Medicare Other | Admitting: Cardiovascular Disease

## 2020-11-03 DIAGNOSIS — I359 Nonrheumatic aortic valve disorder, unspecified: Secondary | ICD-10-CM

## 2020-11-03 DIAGNOSIS — Z5181 Encounter for therapeutic drug level monitoring: Secondary | ICD-10-CM

## 2020-11-03 DIAGNOSIS — Z952 Presence of prosthetic heart valve: Secondary | ICD-10-CM | POA: Diagnosis not present

## 2020-11-03 DIAGNOSIS — I6350 Cerebral infarction due to unspecified occlusion or stenosis of unspecified cerebral artery: Secondary | ICD-10-CM

## 2020-11-03 LAB — POCT INR: INR: 2.2 (ref 2.0–3.0)

## 2020-11-04 DIAGNOSIS — K8050 Calculus of bile duct without cholangitis or cholecystitis without obstruction: Secondary | ICD-10-CM | POA: Diagnosis not present

## 2020-11-04 DIAGNOSIS — I130 Hypertensive heart and chronic kidney disease with heart failure and stage 1 through stage 4 chronic kidney disease, or unspecified chronic kidney disease: Secondary | ICD-10-CM | POA: Diagnosis not present

## 2020-11-04 DIAGNOSIS — I5042 Chronic combined systolic (congestive) and diastolic (congestive) heart failure: Secondary | ICD-10-CM | POA: Diagnosis not present

## 2020-11-04 DIAGNOSIS — N1832 Chronic kidney disease, stage 3b: Secondary | ICD-10-CM | POA: Diagnosis not present

## 2020-11-04 DIAGNOSIS — J449 Chronic obstructive pulmonary disease, unspecified: Secondary | ICD-10-CM | POA: Diagnosis not present

## 2020-11-04 DIAGNOSIS — N179 Acute kidney failure, unspecified: Secondary | ICD-10-CM | POA: Diagnosis not present

## 2020-11-10 ENCOUNTER — Ambulatory Visit (INDEPENDENT_AMBULATORY_CARE_PROVIDER_SITE_OTHER): Payer: Medicare Other | Admitting: Cardiovascular Disease

## 2020-11-10 DIAGNOSIS — N179 Acute kidney failure, unspecified: Secondary | ICD-10-CM | POA: Diagnosis not present

## 2020-11-10 DIAGNOSIS — Z5181 Encounter for therapeutic drug level monitoring: Secondary | ICD-10-CM

## 2020-11-10 DIAGNOSIS — N1832 Chronic kidney disease, stage 3b: Secondary | ICD-10-CM | POA: Diagnosis not present

## 2020-11-10 DIAGNOSIS — I130 Hypertensive heart and chronic kidney disease with heart failure and stage 1 through stage 4 chronic kidney disease, or unspecified chronic kidney disease: Secondary | ICD-10-CM | POA: Diagnosis not present

## 2020-11-10 DIAGNOSIS — Z952 Presence of prosthetic heart valve: Secondary | ICD-10-CM

## 2020-11-10 DIAGNOSIS — I6350 Cerebral infarction due to unspecified occlusion or stenosis of unspecified cerebral artery: Secondary | ICD-10-CM

## 2020-11-10 DIAGNOSIS — I359 Nonrheumatic aortic valve disorder, unspecified: Secondary | ICD-10-CM

## 2020-11-10 DIAGNOSIS — J449 Chronic obstructive pulmonary disease, unspecified: Secondary | ICD-10-CM | POA: Diagnosis not present

## 2020-11-10 DIAGNOSIS — K8050 Calculus of bile duct without cholangitis or cholecystitis without obstruction: Secondary | ICD-10-CM | POA: Diagnosis not present

## 2020-11-10 DIAGNOSIS — I5042 Chronic combined systolic (congestive) and diastolic (congestive) heart failure: Secondary | ICD-10-CM | POA: Diagnosis not present

## 2020-11-10 LAB — POCT INR: INR: 3.2 — AB (ref 2.0–3.0)

## 2020-11-11 ENCOUNTER — Other Ambulatory Visit: Payer: Self-pay | Admitting: Orthopaedic Surgery

## 2020-11-14 ENCOUNTER — Other Ambulatory Visit: Payer: Self-pay | Admitting: Cardiology

## 2020-11-17 ENCOUNTER — Ambulatory Visit (INDEPENDENT_AMBULATORY_CARE_PROVIDER_SITE_OTHER): Payer: Medicare Other | Admitting: Cardiology

## 2020-11-17 DIAGNOSIS — I6350 Cerebral infarction due to unspecified occlusion or stenosis of unspecified cerebral artery: Secondary | ICD-10-CM

## 2020-11-17 DIAGNOSIS — Z5181 Encounter for therapeutic drug level monitoring: Secondary | ICD-10-CM | POA: Diagnosis not present

## 2020-11-17 DIAGNOSIS — Z952 Presence of prosthetic heart valve: Secondary | ICD-10-CM

## 2020-11-17 DIAGNOSIS — I359 Nonrheumatic aortic valve disorder, unspecified: Secondary | ICD-10-CM | POA: Diagnosis not present

## 2020-11-17 LAB — POCT INR: INR: 2 (ref 2.0–3.0)

## 2020-11-20 ENCOUNTER — Ambulatory Visit (INDEPENDENT_AMBULATORY_CARE_PROVIDER_SITE_OTHER): Payer: Medicare Other | Admitting: Ophthalmology

## 2020-11-20 ENCOUNTER — Encounter (INDEPENDENT_AMBULATORY_CARE_PROVIDER_SITE_OTHER): Payer: Self-pay | Admitting: Ophthalmology

## 2020-11-20 ENCOUNTER — Other Ambulatory Visit: Payer: Self-pay

## 2020-11-20 ENCOUNTER — Encounter (INDEPENDENT_AMBULATORY_CARE_PROVIDER_SITE_OTHER): Payer: Medicare Other | Admitting: Ophthalmology

## 2020-11-20 DIAGNOSIS — H353221 Exudative age-related macular degeneration, left eye, with active choroidal neovascularization: Secondary | ICD-10-CM

## 2020-11-20 DIAGNOSIS — H35721 Serous detachment of retinal pigment epithelium, right eye: Secondary | ICD-10-CM

## 2020-11-20 DIAGNOSIS — H353212 Exudative age-related macular degeneration, right eye, with inactive choroidal neovascularization: Secondary | ICD-10-CM

## 2020-11-20 MED ORDER — AFLIBERCEPT 2MG/0.05ML IZ SOLN FOR KALEIDOSCOPE
2.0000 mg | INTRAVITREAL | Status: AC | PRN
  Administered 2020-11-20: 2 mg via INTRAVITREAL

## 2020-11-20 NOTE — Assessment & Plan Note (Signed)
OD no signs of disease activity today observe

## 2020-11-20 NOTE — Assessment & Plan Note (Signed)
Chronic active CNVM with CME and subretinal fluid.  Slightly improved on intravitreal Eylea today currently at 7-week interval.  We will repeat examination left eye in 7 weeks

## 2020-11-20 NOTE — Progress Notes (Signed)
11/20/2020     CHIEF COMPLAINT Patient presents for Retina Follow Up (7 WK FU OS, POSS EYLEA OS////Pt reports decline in vision, "I see a line going through things when I try to write", no pain or pressure, no new F/F. )   HISTORY OF PRESENT ILLNESS: [name] is a 87 y.o. female who presents to the clinic today for:   HPI    Retina Follow Up    Patient presents with  Wet AMD.  In left eye.  This started 7 weeks ago.  Duration of 7 weeks.  Since onset it is gradually worsening. Additional comments: 7 WK FU OS, POSS EYLEA OS    Pt reports decline in vision, "I see a line going through things when I try to write", no pain or pressure, no new F/F.        Last edited by Varney Biles D on 11/20/2020 10:22 AM. (History)      Referring physician: Oval Linsey, MD [address] [place],  [place] [address]  HISTORICAL INFORMATION:   Selected notes from the MEDICAL RECORD NUMBER    Lab Results  Component Value Date   HGBA1C 5.7 (H) 10/27/2019     CURRENT MEDICATIONS: No current outpatient medications on file. (Ophthalmic Drugs)   No current facility-administered medications for this visit. (Ophthalmic Drugs)   Current Outpatient Medications (Other)  Medication Sig  . acetaminophen (TYLENOL) 650 MG CR tablet Take 650 mg by mouth every 8 (eight) hours as needed for pain.  Marland Kitchen ALPRAZolam (XANAX) 0.25 MG tablet Take 0.125-0.25 mg by mouth at bedtime as needed for anxiety.   Ailene Ards ELLIPTA 62.5-25 MCG/INH AEPB USE 1 INHALATION ONCE DAILY.  Marland Kitchen atorvastatin (LIPITOR) 40 MG tablet TAKE ONE TABLET BY MOUTH ONCE DAILY.  . baclofen (LIORESAL) 10 MG tablet Take 10 mg by mouth as needed for muscle spasms.  . carvedilol (COREG) 3.125 MG tablet Take 1 tablet (3.125 mg total) by mouth 2 (two) times daily with a meal. (Patient taking differently: Take 6.25 mg by mouth 2 (two) times daily with a meal. Pt takes 6.5 mg twice a day.)  . docusate sodium (COLACE) 100 MG capsule Take  100 mg by mouth every evening.   . donepezil (ARICEPT) 10 MG tablet TAKE ONE TABLET BY MOUTH ONCE DAILY. (Patient taking differently: Take 10 mg by mouth at bedtime. )  . ergocalciferol (VITAMIN D2) 50000 UNITS capsule Take 50,000 Units by mouth every Tuesday.   . ferrous sulfate 325 (65 FE) MG EC tablet Take 325 mg by mouth daily.   . furosemide (LASIX) 40 MG tablet TAKE ONE TABLET BY MOUTH ONCE DAILY.  Marland Kitchen HYDROcodone-acetaminophen (NORCO) 7.5-325 MG tablet Take 1 tablet by mouth every 6 (six) hours as needed for moderate pain.  . hydrocortisone (ANUSOL-HC) 2.5 % rectal cream Place 1 application rectally 3 (three) times daily as needed for hemorrhoids or anal itching.  . levalbuterol (XOPENEX) 0.63 MG/3ML nebulizer solution USE 1 VIAL VIA NEBULIZER EVERY 8 HOURS AS NEEDED.  Marland Kitchen meclizine (ANTIVERT) 25 MG tablet Take 25 mg by mouth 3 (three) times daily as needed for dizziness.  . Melatonin 3 MG TABS Take 1 tablet by mouth at bedtime.  . methylPREDNISolone (MEDROL) 4 MG tablet Take 4 mg by mouth 2 (two) times daily as needed.  . multivitamin-lutein (OCUVITE-LUTEIN) CAPS capsule Take 1 capsule by mouth daily.  . ondansetron (ZOFRAN) 4 MG tablet Take 4 mg by mouth every 8 (eight) hours as needed for  nausea or vomiting.  . polyethylene glycol (MIRALAX / GLYCOLAX) 17 g packet Take 17 g by mouth daily as needed for moderate constipation.  . pregabalin (LYRICA) 75 MG capsule TAKE 1 CAPSULE BY MOUTH EVERY OTHER DAY.  Marland Kitchen spironolactone (ALDACTONE) 25 MG tablet TAKE (1/2) TABLET BY MOUTH DAILY. (Patient taking differently: Take 12.5 mg by mouth daily. )  . traZODone (DESYREL) 50 MG tablet Take 0.5-1 tablets (25-50 mg total) by mouth at bedtime as needed for sleep. (Patient not taking: Reported on 09/08/2020)  . warfarin (COUMADIN) 5 MG tablet TAKE 1/2 TO 1 TABLET ONCE DAILY AS DIRECTED BY COUMADIN CLINIC.  Marland Kitchen zinc sulfate 50 MG CAPS capsule Take 50 mg by mouth 2 (two) times daily.    No current  facility-administered medications for this visit. (Other)      REVIEW OF SYSTEMS:    ALLERGIES Allergies  Allergen Reactions  . Codeine Nausea And Vomiting  . Penicillins Other (See Comments)    Unknown reaction - listed on MAR Has patient had a PCN reaction causing immediate rash, facial/tongue/throat swelling, SOB or lightheadedness with hypotension: Unknown Has patient had a PCN reaction causing severe rash involving mucus membranes or skin necrosis: Unknown Has patient had a PCN reaction that required hospitalization: Unknown Has patient had a PCN reaction occurring within the last 10 years: Unknown If all of the above answers are "NO", then may proceed with Cephalosporin use.    PAST MEDICAL HISTORY Past Medical History:  Diagnosis Date  . Anemia   . Anxiety   . Arthritis   . Blood transfusion without reported diagnosis   . Carotid artery stenosis    Without infarction  . Cataract   . Chronic combined systolic (congestive) and diastolic (congestive) heart failure (HCC)   . COPD (chronic obstructive pulmonary disease) (HCC)    Wears 02 at home 2 L  . Coronary artery disease   . Heart valve replaced by other means   . Hypercholesterolemia    Pure  . Hypertension    Unspecified  . LBBB (left bundle branch block)   . Macular degeneration (senile) of retina, unspecified   . On home O2   . Postsurgical aortocoronary bypass status   . Stroke (HCC)   . Transient global amnesia   . Unspecified hereditary and idiopathic peripheral neuropathy   . Unspecified vitamin D deficiency    Past Surgical History:  Procedure Laterality Date  . AORTIC VALVE REPLACEMENT    . APPENDECTOMY    . CORONARY ARTERY BYPASS GRAFT    . ERCP N/A 09/13/2020   Procedure: ENDOSCOPIC RETROGRADE CHOLANGIOPANCREATOGRAPHY (ERCP);  Surgeon: Rachael Fee, MD;  Location: [facility] ENDOSCOPY;  Service: Endoscopy;  Laterality: N/A;  . HIP PINNING,CANNULATED Right 07/18/2018   Procedure: RIGHT CANNULATED  HIP PINNING;  Surgeon: Myrene Galas, MD;  Location: [facility] OR;  Service: Orthopedics;  Laterality: Right;  . REMOVAL OF STONES  09/13/2020   Procedure: REMOVAL OF STONES;  Surgeon: Rachael Fee, MD;  Location: [facility] ENDOSCOPY;  Service: Endoscopy;;  . Dennison Mascot  09/13/2020   Procedure: Dennison Mascot;  Surgeon: Rachael Fee, MD;  Location: [facility] ENDOSCOPY;  Service: Endoscopy;;  . TONSILLECTOMY      FAMILY HISTORY Family History  Problem Relation Age of Onset  . Heart attack Father   . Heart disease Father   . Stroke Father   . Stroke Sister   . Hypertension Mother   . Stroke Mother   . Neuropathy Brother   . COPD Sister  SOCIAL HISTORY Social History   Tobacco Use  . Smoking status: Former Smoker    Packs/day: 0.50    Types: Cigarettes    Quit date: 11/30/1991    Years since quitting: 28.9  . Smokeless tobacco: Never Used  . Tobacco comment: Tobacco use-no  Substance Use Topics  . Alcohol use: No  . Drug use: No         OPHTHALMIC EXAM:  Base Eye Exam    Visual Acuity (ETDRS)      Right Left   Dist cc 20/60 20/100 -2   Dist ph cc 20/50 +2 NI   Correction: Glasses       Tonometry (Tonopen, 10:31 AM)      Right Left   Pressure 7 8       Pupils      Pupils Dark Light Shape React APD   Right PERRL 1 1 Round Minimal None   Left PERRL 1 1 Round Minimal None       Visual Fields (Counting fingers)      Left Right     Full   Restrictions Partial outer superior nasal deficiency        Extraocular Movement      Right Left    Full Full       Neuro/Psych    Oriented x3: Yes   Mood/Affect: Normal       Dilation    Left eye: 1.0% Mydriacyl, 2.5% Phenylephrine @ 10:32 AM        Slit Lamp and Fundus Exam    External Exam      Right Left   External Normal Normal       Slit Lamp Exam      Right Left   Lids/Lashes Normal Normal   Conjunctiva/Sclera White and quiet White and quiet   Cornea Clear Clear   Anterior Chamber Deep and quiet  Deep and quiet   Iris Round and reactive Round and reactive   Lens Clear Centered posterior chamber intraocular lens   Anterior Vitreous Normal Normal       Fundus Exam      Right Left   Posterior Vitreous  Posterior vitreous detachment, Central vitreous floaters   Disc  Normal, collateral vessels on the nerve   C/D Ratio  0.3   Macula Normal No Subretinal hemorrhage, Macular thickening   Vessels  Normal   Periphery Normal Normal          IMAGING AND PROCEDURES  Imaging and Procedures for 11/20/20  OCT, Retina - OU - Both Eyes       Right Eye Quality was good. Scan locations included subfoveal. Central Foveal Thickness: 247. Progression has been stable. Findings include abnormal foveal contour, subretinal scarring, outer retinal atrophy, central retinal atrophy.   Left Eye Quality was good. Scan locations included subfoveal. Central Foveal Thickness: 430. Progression has improved. Findings include abnormal foveal contour, subretinal fluid, pigment epithelial detachment.   Notes OS slightly improved retinal thickening and CME currently at 7-week follow-up interval OS.  We will repeat Eylea OS today but will begin to consider whether or not this lesion on the left may be resistant       Intravitreal Injection, Pharmacologic Agent - OS - Left Eye       Time Out 11/20/2020. 10:56 AM. Confirmed correct patient, procedure, site, and patient consented.   Anesthesia Topical anesthesia was used. Anesthetic medications included Akten 3.5%.   Procedure Preparation included 5% betadine to ocular surface, 10%  betadine to eyelids, Tobramycin 0.3%. A 30 gauge needle was used.   Injection:  2 mg aflibercept Alfonse Flavors) SOLN   NDC: [id number], Lot: [id number]   Route: Intravitreal, Site: Left Eye, Waste: 0 mg  Post-op Post injection exam found visual acuity of at least counting fingers. The patient tolerated the procedure well. There were no complications. The patient received  written and verbal post procedure care education. Post injection medications were not given.                 ASSESSMENT/PLAN:  Exudative age-related macular degeneration of left eye with active choroidal neovascularization (HCC) Chronic active CNVM with CME and subretinal fluid.  Slightly improved on intravitreal Eylea today currently at 7-week interval.  We will repeat examination left eye in 7 weeks  Exudative age-related macular degeneration of right eye with inactive choroidal neovascularization (HCC) OD no signs of disease activity today observe  Serous detachment of retinal pigment epithelium of right eye Currently resolved      ICD-10-CM   1. Exudative age-related macular degeneration of left eye with active choroidal neovascularization (HCC)  H35.3221 OCT, Retina - OU - Both Eyes    Intravitreal Injection, Pharmacologic Agent - OS - Left Eye    aflibercept (EYLEA) SOLN 2 mg  2. Exudative age-related macular degeneration of right eye with inactive choroidal neovascularization ([place])  H35.3212   3. Serous detachment of retinal pigment epithelium of right eye  H35.721     1.  OS with chronic active CNVM and serous subretinal fluid at 7-week follow-up interval.  We will repeat injection OS today intravitreal Eylea and evaluate again in 7 weeks.  Review of recent disease activity on Avastin, somewhat better on Eylea yet still active, will repeat injection today, #3 overall  2.  3.  Ophthalmic Meds Ordered this visit:  Meds ordered this encounter  Medications  . aflibercept (EYLEA) SOLN 2 mg       Return in about 7 weeks (around 01/08/2021) for DILATE OU, EYLEA OCT, OS.  There are no Patient Instructions on file for this visit.   Explained the diagnoses, plan, and follow up with the patient and they expressed understanding.  Patient expressed understanding of the importance of proper follow up care.   Clent Demark Holden Maniscalco M.D. Diseases & Surgery of the Retina and  Vitreous Retina & Diabetic [place] 11/20/20     Abbreviations: M myopia (nearsighted); A astigmatism; H hyperopia (farsighted); P presbyopia; Mrx spectacle prescription;  CTL contact lenses; OD right eye; OS left eye; OU both eyes  XT exotropia; ET esotropia; PEK punctate epithelial keratitis; PEE punctate epithelial erosions; DES dry eye syndrome; MGD meibomian gland dysfunction; ATs artificial tears; PFAT's preservative free artificial tears; [place] nuclear sclerotic cataract; PSC posterior subcapsular cataract; ERM epi-retinal membrane; PVD posterior vitreous detachment; RD retinal detachment; DM diabetes mellitus; DR diabetic retinopathy; NPDR non-proliferative diabetic retinopathy; PDR proliferative diabetic retinopathy; CSME clinically significant macular edema; DME diabetic macular edema; dbh dot blot hemorrhages; CWS cotton wool spot; POAG primary open angle glaucoma; C/D cup-to-disc ratio; HVF humphrey visual field; GVF goldmann visual field; OCT optical coherence tomography; IOP intraocular pressure; BRVO Branch retinal vein occlusion; CRVO central retinal vein occlusion; CRAO central retinal artery occlusion; BRAO branch retinal artery occlusion; RT retinal tear; SB scleral buckle; PPV pars plana vitrectomy; VH Vitreous hemorrhage; PRP panretinal laser photocoagulation; IVK intravitreal kenalog; VMT vitreomacular traction; MH Macular hole;  NVD neovascularization of the disc; NVE neovascularization elsewhere; AREDS age related eye disease study; ARMD  age related macular degeneration; POAG primary open angle glaucoma; EBMD epithelial/anterior basement membrane dystrophy; ACIOL anterior chamber intraocular lens; IOL intraocular lens; PCIOL posterior chamber intraocular lens; Phaco/IOL phacoemulsification with intraocular lens placement; [place] photorefractive keratectomy; LASIK laser assisted in situ keratomileusis; HTN hypertension; DM diabetes mellitus; COPD chronic obstructive pulmonary disease

## 2020-11-20 NOTE — Assessment & Plan Note (Signed)
Currently resolved. °

## 2020-11-24 ENCOUNTER — Ambulatory Visit (INDEPENDENT_AMBULATORY_CARE_PROVIDER_SITE_OTHER): Payer: Medicare Other | Admitting: Cardiology

## 2020-11-24 DIAGNOSIS — Z5181 Encounter for therapeutic drug level monitoring: Secondary | ICD-10-CM | POA: Diagnosis not present

## 2020-11-24 DIAGNOSIS — I359 Nonrheumatic aortic valve disorder, unspecified: Secondary | ICD-10-CM

## 2020-11-24 DIAGNOSIS — Z952 Presence of prosthetic heart valve: Secondary | ICD-10-CM

## 2020-11-24 DIAGNOSIS — I6350 Cerebral infarction due to unspecified occlusion or stenosis of unspecified cerebral artery: Secondary | ICD-10-CM

## 2020-11-24 LAB — POCT INR: INR: 2.9 (ref 2.0–3.0)

## 2020-12-01 LAB — POCT INR: INR: 2.5 (ref 2.0–3.0)

## 2020-12-02 ENCOUNTER — Ambulatory Visit (INDEPENDENT_AMBULATORY_CARE_PROVIDER_SITE_OTHER): Payer: Medicare Other | Admitting: Pharmacist

## 2020-12-02 DIAGNOSIS — Z5181 Encounter for therapeutic drug level monitoring: Secondary | ICD-10-CM

## 2020-12-02 DIAGNOSIS — I6350 Cerebral infarction due to unspecified occlusion or stenosis of unspecified cerebral artery: Secondary | ICD-10-CM | POA: Diagnosis not present

## 2020-12-02 DIAGNOSIS — I359 Nonrheumatic aortic valve disorder, unspecified: Secondary | ICD-10-CM

## 2020-12-02 DIAGNOSIS — Z952 Presence of prosthetic heart valve: Secondary | ICD-10-CM

## 2020-12-02 NOTE — Progress Notes (Signed)
HPI: FU coronary artery disease (s/p CABG), prior aortic valve replacement and aortic insufficiency. Last cardiac catheterization in 2011 revealed normal left main; 30% first diagonal, 50% LAD and 60% second diagonal. There was a 40-50% circumflex and a 40% right coronary artery. All grafts were occluded. Medical therapy recommended. Transesophageal echocardiogram in February of 2012 showed an ejection fraction of 45%. There was a [employer]. Jude aortic valve with moderate perivalvular aortic insufficiency. There was moderate mitral regurgitation, moderate left atrial enlargement, mild right atrial enlargement and mild tricuspid regurgitation.Carotid dopplers August 2020 showed 40 to 59% right and 1 to 39% left stenosis.Echocardiogram October 2021 interpreted as normal LV function, moderate left ventricular hypertrophy, grade 1 diastolic dysfunction, biatrial enlargement, mild to moderate mitral regurgitation, previous aortic valve replacement with moderate aortic insufficiency. I personally reviewed the images and ejection fraction appears to be 40-45%. CTA October 2021 showed 2.9 x 3.1 cm abdominal aortic aneurysm. Since she was last seen,she has some dyspnea on exertion but no orthopnea, PND, pedal edema, chest pain, syncope or bleeding.  Current Outpatient Medications  Medication Sig Dispense Refill  . acetaminophen (TYLENOL) 650 MG CR tablet Take 650 mg by mouth every 8 (eight) hours as needed for pain.    Marland Kitchen ALPRAZolam (XANAX) 0.25 MG tablet Take 0.125-0.25 mg by mouth at bedtime as needed for anxiety.     Ailene Ards ELLIPTA 62.5-25 MCG/INH AEPB USE 1 INHALATION ONCE DAILY. 60 each 11  . atorvastatin (LIPITOR) 40 MG tablet TAKE ONE TABLET BY MOUTH ONCE DAILY. 90 tablet 3  . baclofen (LIORESAL) 10 MG tablet Take 10 mg by mouth as needed for muscle spasms.    . carvedilol (COREG) 3.125 MG tablet Take 1 tablet (3.125 mg total) by mouth 2 (two) times daily with a meal. (Patient taking differently: Take  6.25 mg by mouth 2 (two) times daily with a meal. Pt takes 6.5 mg twice a day.) 180 tablet 3  . docusate sodium (COLACE) 100 MG capsule Take 100 mg by mouth every evening.     . donepezil (ARICEPT) 10 MG tablet TAKE ONE TABLET BY MOUTH ONCE DAILY. (Patient taking differently: Take 10 mg by mouth at bedtime.) 30 tablet 0  . ergocalciferol (VITAMIN D2) 50000 UNITS capsule Take 50,000 Units by mouth every Tuesday.    . ferrous sulfate 325 (65 FE) MG EC tablet Take 325 mg by mouth daily.     . furosemide (LASIX) 40 MG tablet TAKE ONE TABLET BY MOUTH ONCE DAILY. 90 tablet 3  . HYDROcodone-acetaminophen (NORCO) 7.5-325 MG tablet Take 1 tablet by mouth every 6 (six) hours as needed for moderate pain.    . hydrocortisone (ANUSOL-HC) 2.5 % rectal cream Place 1 application rectally 3 (three) times daily as needed for hemorrhoids or anal itching.    . levalbuterol (XOPENEX) 0.63 MG/3ML nebulizer solution USE 1 VIAL VIA NEBULIZER EVERY 8 HOURS AS NEEDED. 144 mL 3  . meclizine (ANTIVERT) 25 MG tablet Take 25 mg by mouth 3 (three) times daily as needed for dizziness.    . Melatonin 3 MG TABS Take 1 tablet by mouth at bedtime.    . methylPREDNISolone (MEDROL) 4 MG tablet Take 4 mg by mouth 2 (two) times daily as needed.    . multivitamin-lutein (OCUVITE-LUTEIN) CAPS capsule Take 1 capsule by mouth daily.    . ondansetron (ZOFRAN) 4 MG tablet Take 4 mg by mouth every 8 (eight) hours as needed for nausea or vomiting.    . polyethylene  glycol (MIRALAX / GLYCOLAX) 17 g packet Take 17 g by mouth daily as needed for moderate constipation.    . pregabalin (LYRICA) 75 MG capsule TAKE 1 CAPSULE BY MOUTH EVERY OTHER DAY. 15 capsule 0  . spironolactone (ALDACTONE) 25 MG tablet TAKE (1/2) TABLET BY MOUTH DAILY. (Patient taking differently: Take 12.5 mg by mouth daily.) 45 tablet 6  . traZODone (DESYREL) 50 MG tablet Take 0.5-1 tablets (25-50 mg total) by mouth at bedtime as needed for sleep. 30 tablet 3  . warfarin  (COUMADIN) 5 MG tablet TAKE 1/2 TO 1 TABLET ONCE DAILY AS DIRECTED BY COUMADIN CLINIC. 90 tablet 0  . zinc sulfate 50 MG CAPS capsule Take 50 mg by mouth 2 (two) times daily.      No current facility-administered medications for this visit.     Past Medical History:  Diagnosis Date  . Anemia   . Anxiety   . Arthritis   . Blood transfusion without reported diagnosis   . Carotid artery stenosis    Without infarction  . Cataract   . Chronic combined systolic (congestive) and diastolic (congestive) heart failure (HCC)   . COPD (chronic obstructive pulmonary disease) (HCC)    Wears 02 at home 2 L  . Coronary artery disease   . Heart valve replaced by other means   . Hypercholesterolemia    Pure  . Hypertension    Unspecified  . LBBB (left bundle branch block)   . Macular degeneration (senile) of retina, unspecified   . On home O2   . Postsurgical aortocoronary bypass status   . Stroke (HCC)   . Transient global amnesia   . Unspecified hereditary and idiopathic peripheral neuropathy   . Unspecified vitamin D deficiency     Past Surgical History:  Procedure Laterality Date  . AORTIC VALVE REPLACEMENT    . APPENDECTOMY    . CORONARY ARTERY BYPASS GRAFT    . ERCP N/A 09/13/2020   Procedure: ENDOSCOPIC RETROGRADE CHOLANGIOPANCREATOGRAPHY (ERCP);  Surgeon: Rachael Fee, MD;  Location: [facility] ENDOSCOPY;  Service: Endoscopy;  Laterality: N/A;  . HIP PINNING,CANNULATED Right 07/18/2018   Procedure: RIGHT CANNULATED HIP PINNING;  Surgeon: Myrene Galas, MD;  Location: [facility] OR;  Service: Orthopedics;  Laterality: Right;  . REMOVAL OF STONES  09/13/2020   Procedure: REMOVAL OF STONES;  Surgeon: Rachael Fee, MD;  Location: [facility] ENDOSCOPY;  Service: Endoscopy;;  . Dennison Mascot  09/13/2020   Procedure: Dennison Mascot;  Surgeon: Rachael Fee, MD;  Location[facility] ENDOSCOPY;  Service: Endoscopy;;  . TONSILLECTOMY      Social History   Socioeconomic History  . Marital status:  Widowed    Spouse name: Not on file  . Number of children: 2  . Years of education: 12th  . Highest education level: Not on file  Occupational History    Employer: RETIRED  Tobacco Use  . Smoking status: Former Smoker    Packs/day: 0.50    Types: Cigarettes    Quit date: 11/30/1991    Years since quitting: 29.0  . Smokeless tobacco: Never Used  . Tobacco comment: Tobacco use-no  Substance and Sexual Activity  . Alcohol use: No  . Drug use: No  . Sexual activity: Never  Other Topics Concern  . Not on file  Social History Narrative   Pt lives at home alone.   Caffeine Use: very rarely   Social Determinants of Health   Financial Resource Strain: Low Risk   . Difficulty of Paying Living Expenses:  Not hard at all  Food Insecurity: No Food Insecurity  . Worried About Programme researcher, broadcasting/film/video in the Last Year: Never true  . Ran Out of Food in the Last Year: Never true  Transportation Needs: No Transportation Needs  . Lack of Transportation (Medical): No  . Lack of Transportation (Non-Medical): No  Physical Activity: Insufficiently Active  . Days of Exercise per Week: 3 days  . Minutes of Exercise per Session: 10 min  Stress: No Stress Concern Present  . Feeling of Stress : Only a little  Social Connections: Moderately Integrated  . Frequency of Communication with Friends and Family: More than three times a week  . Frequency of Social Gatherings with Friends and Family: Three times a week  . Attends Religious Services: 1 to 4 times per year  . Active Member of Clubs or Organizations: Yes  . Attends Banker Meetings: 1 to 4 times per year  . Marital Status: Widowed  Intimate Partner Violence: Not At Risk  . Fear of Current or Ex-Partner: No  . Emotionally Abused: No  . Physically Abused: No  . Sexually Abused: No    Family History  Problem Relation Age of Onset  . Heart attack Father   . Heart disease Father   . Stroke Father   . Stroke Sister   .  Hypertension Mother   . Stroke Mother   . Neuropathy Brother   . COPD Sister     ROS: no fevers or chills, productive cough, hemoptysis, dysphasia, odynophagia, melena, hematochezia, dysuria, hematuria, rash, seizure activity, orthopnea, PND, pedal edema, claudication. Remaining systems are negative.  Physical Exam: Well-developed well-nourished in no acute distress.  Skin is warm and dry.  HEENT is normal.  Neck is supple.  Chest is clear to auscultation with normal expansion.  Cardiovascular exam is regular rate and rhythm.  1/6 systolic murmur and 2/6 diastolic murmur left sternal border. Abdominal exam nontender or distended. No masses palpated. Extremities show no edema. neuro grossly intact  A/P  1 chronic systolic congestive heart failure-she appears to be euvolemic at present time. Continue Lasix and spironolactone at present dose. Check potassium and renal function.  2 cardiomyopathy-we will continue beta-blocker at present dose. She did not tolerate ARB due to hypotension.  3 prior aortic valve replacement-continue SBE prophylaxis. Continue Coumadin with goal INR 2-3. Aspirin discontinued previously due to thigh hematoma. She does have moderate aortic insufficiency. Plan conservative measures given patient's age and overall medical condition. Check CBC  4 hypertension-blood pressure elevated; however typically controlled to mildly decreased at home.  Continue present medications and follow.  5 hyperlipidemia-continue statin.  6 coronary artery disease-we will continue statin. She is not on aspirin given need for Coumadin.  7 carotid artery disease-continue statin. As outlined in previous notes we will not pursue follow-up carotid Dopplers given age and overall medical condition. She would not be an operative candidate.  8 abdominal aortic aneurysm-we will not pursue follow-up imaging as I do not think she would be a candidate for surgical intervention.  [name],  MD

## 2020-12-04 ENCOUNTER — Other Ambulatory Visit: Payer: Medicare Other

## 2020-12-04 ENCOUNTER — Other Ambulatory Visit: Payer: Self-pay

## 2020-12-04 VITALS — BP 150/54 | HR 68 | Temp 98.8°F | Resp 22 | Wt 109.0 lb

## 2020-12-04 DIAGNOSIS — Z515 Encounter for palliative care: Secondary | ICD-10-CM

## 2020-12-04 NOTE — Progress Notes (Signed)
PATIENT NAME: [name] DOB: 05/16/1926 MRN: [id number]  PRIMARY CARE PROVIDER: Dondiego, Richard, MD  RESPONSIBLE PARTY:  Acct ID - Guarantor Home Phone Work Phone Relationship Acct Type  [id number] - Funk,RU* [phone]  Self P/F     [address], [place], [place] [address]    PLAN OF CARE and INTERVENTIONS:               1.  GOALS OF CARE/ ADVANCE CARE PLANNING:  Remain home with a private caregiver.               2.  PATIENT/CAREGIVER EDUCATION:  Re-enforced nebulizer and monitoring daily weights.               4. PERSONAL EMERGENCY PLAN:  Activate 911 for emergencies.               5.  DISEASE STATUS:  Joint visit completed with patient and caregiver [name].    Patient states she has been doing well.  She did have a "bad day" on Tuesday and required breathing treatments.  Caregiver noted blood pressure was elevated but returned to patient's baseline the next day.  Patient's weight has been stable at 109 lbs and no extra doses of lasix has been required.  Patient continues to have some dizziness.  She notes this has improved since the decrease in spirolactone last year.  Patient reports rising slowing and allowing time for her body to adjust before getting up and ambulating.  There have been no falls reported since my last visit.  Patient denies any nausea/vomitting.  She is having regular bowel movements with her most recent one today.  She notes some constipation with iron but continues with colace routinely.  Patient's po intake remains good.  Her best meal is at lunch.  She typically goes out for lunch with her caregiver.  Patient states she will snack on occasion during the day.  Patient continues to dress and toliet herself.  She does require supervision for showers mainly for safety.   HISTORY OF PRESENT ILLNESS:  87 year old female with a hx of CHF.  Patient is being followed by Palliative Care monthly and prn.  CODE STATUS: DNR  ADVANCED DIRECTIVES: No MOST FORM:  No PPS: 50%   PHYSICAL EXAM:   VITALS:  Temp 98.8 BP 150/54 P 68 R 22 O2 sats 99% on 2 L via [place] LUNGS: CTA, DOE  CARDIAC: HRR EXTREMITIES: No edema SKIN: Warm and dry to touch.  No skin breakdown reported by patient or caregiver. NEURO: alert and oriented x 3        A , RN 

## 2020-12-09 ENCOUNTER — Encounter: Payer: Self-pay | Admitting: Cardiology

## 2020-12-09 ENCOUNTER — Other Ambulatory Visit: Payer: Self-pay

## 2020-12-09 ENCOUNTER — Ambulatory Visit (INDEPENDENT_AMBULATORY_CARE_PROVIDER_SITE_OTHER): Payer: Medicare Other | Admitting: Cardiology

## 2020-12-09 VITALS — BP 152/58 | HR 67 | Ht 62.5 in | Wt 110.0 lb

## 2020-12-09 DIAGNOSIS — I10 Essential (primary) hypertension: Secondary | ICD-10-CM

## 2020-12-09 DIAGNOSIS — I2510 Atherosclerotic heart disease of native coronary artery without angina pectoris: Secondary | ICD-10-CM

## 2020-12-09 DIAGNOSIS — Z952 Presence of prosthetic heart valve: Secondary | ICD-10-CM

## 2020-12-09 DIAGNOSIS — I420 Dilated cardiomyopathy: Secondary | ICD-10-CM | POA: Diagnosis not present

## 2020-12-09 DIAGNOSIS — I255 Ischemic cardiomyopathy: Secondary | ICD-10-CM | POA: Diagnosis not present

## 2020-12-09 DIAGNOSIS — I6350 Cerebral infarction due to unspecified occlusion or stenosis of unspecified cerebral artery: Secondary | ICD-10-CM

## 2020-12-09 LAB — BASIC METABOLIC PANEL
BUN/Creatinine Ratio: 14 (ref 12–28)
BUN: 17 mg/dL (ref 10–36)
CO2: 31 mmol/L — ABNORMAL HIGH (ref 20–29)
Calcium: 9.4 mg/dL (ref 8.7–10.3)
Chloride: 102 mmol/L (ref 96–106)
Creatinine, Ser: 1.18 mg/dL — ABNORMAL HIGH (ref 0.57–1.00)
GFR calc Af Amer: 46 mL/min/{1.73_m2} — ABNORMAL LOW (ref >59)
GFR calc non Af Amer: 40 mL/min/{1.73_m2} — ABNORMAL LOW (ref >59)
Glucose: 61 mg/dL — ABNORMAL LOW (ref 65–99)
Potassium: 4 mmol/L (ref 3.5–5.2)
Sodium: 143 mmol/L (ref 134–144)

## 2020-12-09 LAB — CBC
Hematocrit: 30.4 % — ABNORMAL LOW (ref 34.0–46.6)
Hemoglobin: 10.1 g/dL — ABNORMAL LOW (ref 11.1–15.9)
MCH: 30.4 pg (ref 26.6–33.0)
MCHC: 33.2 g/dL (ref 31.5–35.7)
MCV: 92 fL (ref 79–97)
Platelets: 179 10*3/uL (ref 150–450)
RBC: 3.32 x10E6/uL — ABNORMAL LOW (ref 3.77–5.28)
RDW: 12.6 % (ref 11.7–15.4)
WBC: 6.6 10*3/uL (ref 3.4–10.8)

## 2020-12-09 NOTE — Patient Instructions (Signed)
  Lab Work:  Your physician recommends that you HAVE LAB WORK TODAY  If you have labs (blood work) drawn today and your tests are completely normal, you will receive your results only by: . MyChart Message (if you have MyChart) OR . A paper copy in the mail If you have any lab test that is abnormal or we need to change your treatment, we will call you to review the results.  Follow-Up: At [facility], you and your health needs are our priority.  As part of our continuing mission to provide you with exceptional heart care, we have created designated Provider Care Teams.  These Care Teams include your primary Cardiologist (physician) and Advanced Practice Providers (APPs -  Physician Assistants and Nurse Practitioners) who all work together to provide you with the care you need, when you need it.  We recommend signing up for the patient portal called "MyChart".  Sign up information is provided on this After Visit Summary.  MyChart is used to connect with patients for Virtual Visits (Telemedicine).  Patients are able to view lab/test results, encounter notes, upcoming appointments, etc.  Non-urgent messages can be sent to your provider as well.   To learn more about what you can do with MyChart, go to [email or web address].    Your next appointment:   6 month(s)  The format for your next appointment:   In Person  Provider:   Brian Crenshaw, MD    

## 2020-12-10 ENCOUNTER — Encounter: Payer: Self-pay | Admitting: *Deleted

## 2020-12-16 ENCOUNTER — Ambulatory Visit (INDEPENDENT_AMBULATORY_CARE_PROVIDER_SITE_OTHER): Payer: Medicare Other | Admitting: Pharmacist

## 2020-12-16 DIAGNOSIS — I359 Nonrheumatic aortic valve disorder, unspecified: Secondary | ICD-10-CM | POA: Diagnosis not present

## 2020-12-16 DIAGNOSIS — Z5181 Encounter for therapeutic drug level monitoring: Secondary | ICD-10-CM | POA: Diagnosis not present

## 2020-12-16 DIAGNOSIS — Z952 Presence of prosthetic heart valve: Secondary | ICD-10-CM | POA: Diagnosis not present

## 2020-12-16 DIAGNOSIS — I6350 Cerebral infarction due to unspecified occlusion or stenosis of unspecified cerebral artery: Secondary | ICD-10-CM

## 2020-12-16 LAB — POCT INR: INR: 2.4 (ref 2.0–3.0)

## 2020-12-22 ENCOUNTER — Other Ambulatory Visit: Payer: Self-pay | Admitting: Internal Medicine

## 2020-12-22 DIAGNOSIS — J449 Chronic obstructive pulmonary disease, unspecified: Secondary | ICD-10-CM

## 2020-12-23 ENCOUNTER — Encounter: Payer: Self-pay | Admitting: Internal Medicine

## 2020-12-23 ENCOUNTER — Ambulatory Visit (INDEPENDENT_AMBULATORY_CARE_PROVIDER_SITE_OTHER): Payer: Medicare Other | Admitting: Internal Medicine

## 2020-12-23 ENCOUNTER — Other Ambulatory Visit: Payer: Self-pay

## 2020-12-23 DIAGNOSIS — J9611 Chronic respiratory failure with hypoxia: Secondary | ICD-10-CM | POA: Diagnosis not present

## 2020-12-23 DIAGNOSIS — J9612 Chronic respiratory failure with hypercapnia: Secondary | ICD-10-CM

## 2020-12-23 DIAGNOSIS — I6350 Cerebral infarction due to unspecified occlusion or stenosis of unspecified cerebral artery: Secondary | ICD-10-CM

## 2020-12-23 DIAGNOSIS — J449 Chronic obstructive pulmonary disease, unspecified: Secondary | ICD-10-CM

## 2020-12-23 NOTE — Assessment & Plan Note (Signed)
Quit smoking around 1997  - maint on anoro and prn xopenex neb as of ov  06/23/2020   Pt is Group B in terms of symptom/risk and laba/lama therefore appropriate rx at this point >>>  Continue anoro and prn saba plus medrol 4 mg contingency for flare since has done this for years and no recent need  Re saba: I spent extra time with pt today reviewing appropriate use of albuterol for prn use on exertion with the following points: 1) saba is for relief of sob that does not improve by walking a slower pace or resting but rather if the pt does not improve after trying this first. 2) If the pt is convinced, as many are, that saba helps recover from activity faster then it's easy to tell if this is the case by re-challenging : ie stop, take the inhaler, then p 5 minutes try the exact same activity (intensity of workload) that just caused the symptoms and see if they are substantially diminished or not after saba 3) if there is an activity that reproducibly causes the symptoms, try the saba 15 min before the activity on alternate days   If in fact the saba really does help, then fine to continue to use it prn but advised may need to look closer at the maintenance regimen being used to achieve better control of airways disease with exertion.

## 2020-12-23 NOTE — Patient Instructions (Addendum)
Make sure you check your oxygen saturation  at your highest level of activity  to be sure it stays over 90% and adjust  02 flow upward to maintain this level if needed but remember to turn it back to previous settings when you stop (to conserve your supply).    For flares of difficulty breathing or arthritis 2 daily until better then taper off gradually    Please schedule a follow up visit in 6  months but call sooner if needed

## 2020-12-23 NOTE — Progress Notes (Signed)
[name], female    DOB: June 12, 1926,    MRN: [id number]   Brief patient profile:  28 yowf pt of [name] previously - she reports she quit smoking around 1997 s resp symptoms then  and placed on 02 around 2014 with dx of systolic chf and maint on anoro    Admit Date: 10/25/2019 Discharge date: 10/30/2019    CODE STATUS: DNR      Brief Summary: See H&P, Labs, Consult and Test reports for all details in brief, Patient is a89 y.o.femalewith PMHx of chronic systolic heart failure, CAD s/p CABG, mechanical aortic valve on anticoagulation, COPD on 3 L of oxygen at home-presented with shortness of breath-found to have acute hypoxic respiratory failure in the setting of COVID-19 pneumonia and decompensated systolic heart failure  Brief Hospital Course: Acute on chronic hypoxic Resp Failure due to Covid 19 Viral pneumonia and decompensated systolic heart failure (EF 25-30% March 2020):Stable-on just 2 L of oxygen.  Treated with Lasix along with steroids and remdesivir, inflammatory markers are slowly downtrending. Volume status is stable-on oral diuretic regimen.  Patient will complete remdesivir on 12/15-she will be discharged on a few days of tapering steroids.    COVID-19 Labs:  Recent Labs (last 2 labs)        Recent Labs    10/28/19 0505 10/29/19 0326 10/30/19 0428  DDIMER 1.48* 0.75* 0.84*  FERRITIN 1,999* 1,131* 769*  CRP 8.4* 5.5* 3.8*      Recent Labs       Lab Results  Component Value Date   SARSCOV2NAA DETECTED (A) 10/20/2019       COVID-19 Medications: Steroids: 12/11>>12/15 Remdesivir: 12/10>>  Other medications: Diuretics:Euvolemic, negative balance of 2.3 L-on oral diuretic regimen Antibiotics:Not needed as no evidence of bacterial infection Insulin: CBG stable with SSI. A1c 5.7 on 12/12.  Nausea/Vomiting:Probably due to COVID-19-continue supportive care-as needed antiemetics.  Elevated troponin: Secondary demand  ischemia-prior hospitalist [name] with cardiology-recommendations are for conservative management.  Transaminitis: Appears mild-secondary to COVID-19-has resolved by the time of discharge.  AKIon CKD stage IIIa:AKI improved-continue to monitor closely while on diuretics.  COPD-on 3 L of oxygen at home:No signs of exacerbation-continue bronchodilators  CAD s/p CABG:No anginal symptoms-see above regarding troponins  History of aortic valve replacement-mechanical valve: On Coumadin-INR supratherapeutic despite of holding Coumadin for several days-managed by pharmacy.  Spoke with pharmacy team-continue to hold Coumadin on discharge until INR is checked in 2 days.-Case management will arrange for INR check by home health RN on 12/17 subsequently dosing of Coumadin will be adjusted by the Coumadin clinic.  Dementia:Appears to be mild-continue Aricept    History of Present Illness  01/01/2020  Pulmonary/ 1st office eval/[name]  Chief Complaint  Patient presents with  . Consult    Former Dr. Juanetta Simpson patient for severe COPD. Currently on 3L. Uses 4L at home. States her breathing has been ok.   Dyspnea: Baseline  goes to gym rides bike x 20 min s stopping on RA does not monitor sats Cough: none now Sleep: no resp ccs x 30 degrees HOB SABA use: neb daily "whether needs it or not "  (says doesn't think she does) rec Plan A = Automatic = Always=    Anoro one click each am  Plan B = Backup (does not replace plan A, it supplements it  - only use your levoalbuterol nebulizer up to every 4 hours if you can't catch your breath  Make sure you check your oxygen saturations at highest  level of activity to be sure it stays over 90%   Goal at rest is to keep 02 above 94% at rest for your heart   Admit date: 02/05/2020 Discharge date: 02/09/2020     Home Health: yes Equipment/Devices: HHPT, RN, 3-4L [place]  Discharge Condition: Stable CODE STATUS: DNR Diet recommendation: Heart  Healthy   Brief/Interim Summary: 87 year old female with a history of systolic and diastolic CHF, chronic respiratory failure on 4 L oxygen, hyperlipidemia, coronary artery disease, LBBB, coronary disease, CKD stage III, AVR presented with 1 to 2 days of worsening shortness of breath. The patient states that she has had intermittent shortness of breath for at least 2 weeks with worsening over the last 1 to 2 days prior to admission. She denies any fevers, chills, chest pain, nausea, vomiting, diarrhea, abdominal pain. Upon presentation, the patient was noted to be fluid overloaded with chest x-ray showing diffuse bilateral interstitial prominence and BNP 1712. She was started on IV furosemide with good clinical effect. During the am 3/25, she became more dyspneic. Repeat CXR and labs were ordered and duonebs were started.  Discharge Diagnoses:  Acute on chronic systolic and diastolic CHF -pt suppose to take 20 mg po bid at home, but she only takes it qday -01/22/2019 echo EF 25-30%, diffuse HK, moderate MR, moderate AI -Continue IV furosemide--dose increased to 40 bid when pt had resp distress 3/25 -Remains fluid overloaded with JVD -Daily weights -Accurate I's and O's--NEG3.1L -SARS-CoV2--neg -d/c home with lasix 40 mg daily  Acute onChronic respiratory failure with hypoxia and hypercarbia -usually on 4L at home -pt on 3L at time of d/c with saturation 97-98% -3/25--repeat CXR--personally reviewed--increased interstitial markings, sm pleural effusions -3/26--sob improved  CKD stage IIIb -Baseline creatinine 0.9-1.2 -Monitor with diuresis -serum creatinine 1.10 on day of d/c  COPD -Stable without wheezing -DiscontinuedIV steroids 3/24 -continuedduonebs -continuedpulmicort  Coronary artery disease status post CABG -No chest pain -Continue carvedilol -continue statin  Status post AVR -Continue warfarin with pharmacy  assistance  Hypokalemia -Repleted -Magnesium2.4  Hypertension -Blood pressure acceptable -Continue carvedilol  Cognitive impairment -Continue Aricept  Hyperlipidemia -Continue statin  LBBB -chronic  03/26/2020  f/u ov/[name] re: COPD Group B  Chief Complaint  Patient presents with  . Follow-up    breathing doing better since hospital visit in march   Dyspnea:  [facility]  Cough: none  Sleeping: hob up about 30 degrees  SABA use: nebulizer q 9 pm  02: 3.5 hs/  And prn daytime   rec No change rx   06/23/2020  f/u ov/Ollie Esty re:  COPD Group B / 02 hs and prn Chief Complaint  Patient presents with  . Follow-up    No complaints  Dyspnea:  Gym 3 x week bike/ some days does mailbox 75 ft / flat  Cough: none  Sleeping: sleeping 30 degrees   SABA use: neb at hs  02: 3.5 hs and uses p ex/ does not check sats during ex  rec Make sure you check your oxygen saturations at highest level of activity to be sure it stays over 90% and adjust upward to maintain      12/23/2020  f/u ov/[place] office/Wilburt Messina re:  GOLD  Group B/  02  4lpm "254/7"  Chief Complaint  Patient presents with  . Follow-up    Shortness of breath with activity  Dyspnea: walking around the house ok/ still doing some gym / worse bending over or  taking a shower Cough: none Sleeping: hosp bed about 30 degrees  SABA use: just hs  02:  4 lpm but not titrating, still wearing at rest.  Covid status: vax max  Lung cancer screening: n/a   No obvious day to day or daytime variability or assoc excess/ purulent sputum or mucus plugs or hemoptysis or cp or chest tightness, subjective wheeze or overt sinus or hb symptoms.   Sleeping  without nocturnal  or early am exacerbation  of respiratory  c/o's or need for noct saba. Also denies any obvious fluctuation of symptoms with weather or environmental changes or other aggravating or alleviating factors except as outlined above    No unusual exposure hx or h/o childhood pna/ asthma or knowledge of premature birth.  Current Allergies, Complete Past Medical History, Past Surgical History, Family History, and Social History were reviewed in [employer] record.  ROS  The following are not active complaints unless bolded Hoarseness, sore throat, dysphagia, dental problems, itching, sneezing,  nasal congestion or discharge of excess mucus or purulent secretions, ear ache,   fever, chills, sweats, unintended wt loss or wt gain, classically pleuritic or exertional cp,  orthopnea pnd or arm/hand swelling  or leg swelling, presyncope, palpitations, abdominal pain, anorexia, nausea, vomiting, diarrhea  or change in bowel habits or change in bladder habits, change in stools or change in urine, dysuria, hematuria,  rash, arthralgias, visual complaints, headache, numbness, weakness or ataxia or problems with walking or coordination,  change in mood or  memory.        Current Meds  Medication Sig  . acetaminophen (TYLENOL) 650 MG CR tablet Take 650 mg by mouth every 8 (eight) hours as needed for pain.  Marland Kitchen ALPRAZolam (XANAX) 0.25 MG tablet Take 0.125-0.25 mg by mouth at bedtime as needed for anxiety.   Ailene Ards ELLIPTA 62.5-25 MCG/INH AEPB USE 1 INHALATION ONCE DAILY.  Marland Kitchen atorvastatin (LIPITOR) 40 MG tablet TAKE ONE TABLET BY MOUTH ONCE DAILY.  . baclofen (LIORESAL) 10 MG tablet Take 10 mg by mouth as needed for muscle spasms.  . carvedilol (COREG) 3.125 MG tablet Take 1 tablet (3.125 mg total) by mouth 2 (two) times daily with a meal. (Patient taking differently: Take 6.25 mg by mouth 2 (two) times daily with a meal. Pt takes 6.5 mg twice a day.)  . docusate sodium (COLACE) 100 MG capsule Take 100 mg by mouth every evening.   . donepezil (ARICEPT) 10 MG tablet TAKE ONE TABLET BY MOUTH ONCE DAILY. (Patient taking differently: Take 10 mg by mouth at bedtime.)  . ergocalciferol (VITAMIN D2) 50000 UNITS capsule Take  50,000 Units by mouth every Tuesday.  . ferrous sulfate 325 (65 FE) MG EC tablet Take 325 mg by mouth daily.   . furosemide (LASIX) 40 MG tablet TAKE ONE TABLET BY MOUTH ONCE DAILY.  Marland Kitchen HYDROcodone-acetaminophen (NORCO) 7.5-325 MG tablet Take 1 tablet by mouth every 6 (six) hours as needed for moderate pain.  . hydrocortisone (ANUSOL-HC) 2.5 % rectal cream Place 1 application rectally 3 (three) times daily as needed for hemorrhoids or anal itching.  . levalbuterol (XOPENEX) 0.63 MG/3ML nebulizer solution USE 1 VIAL VIA NEBULIZER EVERY 8 HOURS AS NEEDED.  Marland Kitchen meclizine (ANTIVERT) 25 MG tablet Take 25 mg by mouth 3 (three) times daily as needed for dizziness.  . Melatonin 3 MG TABS Take 1 tablet by mouth at bedtime.  . methylPREDNISolone (MEDROL) 4 MG tablet Take 4 mg by mouth 2 (two) times daily  as needed.  . multivitamin-lutein (OCUVITE-LUTEIN) CAPS capsule Take 1 capsule by mouth daily.  . ondansetron (ZOFRAN) 4 MG tablet Take 4 mg by mouth every 8 (eight) hours as needed for nausea or vomiting.  . polyethylene glycol (MIRALAX / GLYCOLAX) 17 g packet Take 17 g by mouth daily as needed for moderate constipation.  . pregabalin (LYRICA) 75 MG capsule TAKE 1 CAPSULE BY MOUTH EVERY OTHER DAY.  Marland Kitchen spironolactone (ALDACTONE) 25 MG tablet TAKE (1/2) TABLET BY MOUTH DAILY. (Patient taking differently: Take 12.5 mg by mouth daily.)  . traZODone (DESYREL) 50 MG tablet Take 0.5-1 tablets (25-50 mg total) by mouth at bedtime as needed for sleep.  Marland Kitchen warfarin (COUMADIN) 5 MG tablet TAKE 1/2 TO 1 TABLET ONCE DAILY AS DIRECTED BY COUMADIN CLINIC.  Marland Kitchen zinc sulfate 50 MG CAPS capsule Take 50 mg by mouth 2 (two) times daily.                 Past Medical History:  Diagnosis Date  . Anemia   . Anxiety   . Arthritis   . Blood transfusion without reported diagnosis   . Carotid artery stenosis    Without infarction  . Cataract   . Chronic combined systolic (congestive) and diastolic (congestive) heart failure  (HCC)   . COPD (chronic obstructive pulmonary disease) (HCC)   . Coronary artery disease   . Heart valve replaced by other means   . Hypercholesterolemia    Pure  . Hypertension    Unspecified  . LBBB (left bundle branch block)   . Macular degeneration (senile) of retina, unspecified   . On home O2   . Postsurgical aortocoronary bypass status   . Stroke (HCC)   . Transient global amnesia   . Unspecified hereditary and idiopathic peripheral neuropathy   . Unspecified vitamin D deficiency        Objective:     12/23/2020          115 06/23/2020          115   03/26/20 112 lb (50.8 kg)  03/18/20 109 lb (49.4 kg)  03/03/20 111 lb 9.6 oz (50.6 kg)     Vital signs reviewed  12/23/2020  - Note at rest 02 sats  96% on RA   General appearance:    Well preserved thin amb wf nad     HEENT : pt wearing mask not removed for exam due to covid - 19 concerns.   NECK :  without JVD/Nodes/TM/ nl carotid upstrokes bilaterally   LUNGS: no acc muscle use,  Min barrel /kyphotic contour chest wall with bilateral  slightly decreased bs s audible wheeze and  without cough on insp or exp maneuvers and min  Hyperresonant  to  percussion bilaterally     CV:  RRR  no s3 or murmur or increase in P2, and no edema   ABD:  soft and nontender with pos end  insp Hoover's  in the supine position. No bruits or organomegaly appreciated, bowel sounds nl  MS:   Nl gait/  ext warm without deformities, calf tenderness, cyanosis or clubbing No obvious joint restrictions   SKIN: warm and dry without lesions    NEURO:  alert, approp, nl sensorium with  no motor or cerebellar deficits apparent.               I personally reviewed images and agree with radiology impression as follows:  CXR:  Portable 09/15/20  1. Chronic appearing increased interstitial  lung markings with mild bibasilar atelectasis and/or infiltrate. 2. Small left pleural effusion.       Assessment

## 2020-12-23 NOTE — Assessment & Plan Note (Signed)
Placed on 02 2014 by Dr [name]  - advised 01/01/2020 :  Monitor sats with goal of > 94% at rest and keep > 90% with ex -  HC03  03/04/20   =  36  -  06/23/2020   Walked RA  approx   300 ft  @ avg pace  stopped due to end of study, no sob with sats 98% at end     Again advised:  Ok to use 4lpm hs but during the day titrate to keep > 90% as a goal.          Each maintenance medication was reviewed in detail including emphasizing most importantly the difference between maintenance and prns and under what circumstances the prns are to be triggered using an action plan format where appropriate.  Total time for H and P, chart review, counseling, reviewing dpi/02  device(s) and generating customized AVS unique to this office visit / same day charting > 30 min

## 2020-12-27 ENCOUNTER — Other Ambulatory Visit: Payer: Self-pay | Admitting: Cardiology

## 2021-01-01 ENCOUNTER — Ambulatory Visit (INDEPENDENT_AMBULATORY_CARE_PROVIDER_SITE_OTHER): Payer: Medicare Other | Admitting: Cardiology

## 2021-01-01 DIAGNOSIS — Z5181 Encounter for therapeutic drug level monitoring: Secondary | ICD-10-CM | POA: Diagnosis not present

## 2021-01-01 DIAGNOSIS — I359 Nonrheumatic aortic valve disorder, unspecified: Secondary | ICD-10-CM

## 2021-01-01 DIAGNOSIS — Z952 Presence of prosthetic heart valve: Secondary | ICD-10-CM | POA: Diagnosis not present

## 2021-01-01 DIAGNOSIS — I6350 Cerebral infarction due to unspecified occlusion or stenosis of unspecified cerebral artery: Secondary | ICD-10-CM

## 2021-01-01 LAB — POCT INR: INR: 2.4 (ref 2.0–3.0)

## 2021-01-05 ENCOUNTER — Telehealth: Payer: Self-pay

## 2021-01-05 NOTE — Telephone Encounter (Signed)
1:25PM: Palliative care SW outreached patient to reschedule upcoming in home visit on 2/23 @4pm  to another day/time.  LVM, awaiting return call.

## 2021-01-08 ENCOUNTER — Encounter (INDEPENDENT_AMBULATORY_CARE_PROVIDER_SITE_OTHER): Payer: Self-pay | Admitting: Ophthalmology

## 2021-01-08 ENCOUNTER — Other Ambulatory Visit: Payer: Self-pay

## 2021-01-08 ENCOUNTER — Ambulatory Visit (INDEPENDENT_AMBULATORY_CARE_PROVIDER_SITE_OTHER): Payer: Medicare Other | Admitting: Ophthalmology

## 2021-01-08 DIAGNOSIS — H353212 Exudative age-related macular degeneration, right eye, with inactive choroidal neovascularization: Secondary | ICD-10-CM

## 2021-01-08 DIAGNOSIS — I6350 Cerebral infarction due to unspecified occlusion or stenosis of unspecified cerebral artery: Secondary | ICD-10-CM | POA: Diagnosis not present

## 2021-01-08 DIAGNOSIS — H353113 Nonexudative age-related macular degeneration, right eye, advanced atrophic without subfoveal involvement: Secondary | ICD-10-CM | POA: Diagnosis not present

## 2021-01-08 DIAGNOSIS — H353221 Exudative age-related macular degeneration, left eye, with active choroidal neovascularization: Secondary | ICD-10-CM

## 2021-01-08 MED ORDER — AFLIBERCEPT 2MG/0.05ML IZ SOLN FOR KALEIDOSCOPE
2.0000 mg | INTRAVITREAL | Status: AC | PRN
  Administered 2021-01-08: 2 mg via INTRAVITREAL

## 2021-01-08 NOTE — Progress Notes (Signed)
01/08/2021     CHIEF COMPLAINT Patient presents for Retina Follow Up (7 Week f\u OU. Possible Eylea OS. OCT/Pt c/o having a hard time reading. Pt states vision is not good.)   HISTORY OF PRESENT ILLNESS: [name] is a 87 y.o. female who presents to the clinic today for:   HPI    Retina Follow Up    Patient presents with  Wet AMD.  In left eye.  Severity is moderate.  Duration of 7 weeks.  Since onset it is stable.  I, the attending physician,  performed the HPI with the patient and updated documentation appropriately. Additional comments: 7 Week f\u OU. Possible Eylea OS. OCT Pt c/o having a hard time reading. Pt states vision is not good.       Last edited by Elyse Jarvis on 01/08/2021 10:25 AM. (History)      Referring physician: Oval Linsey, MD [address] [place],  [place] [address]  HISTORICAL INFORMATION:   Selected notes from the MEDICAL RECORD NUMBER    Lab Results  Component Value Date   HGBA1C 5.7 (H) 10/27/2019     CURRENT MEDICATIONS: No current outpatient medications on file. (Ophthalmic Drugs)   No current facility-administered medications for this visit. (Ophthalmic Drugs)   Current Outpatient Medications (Other)  Medication Sig  . acetaminophen (TYLENOL) 650 MG CR tablet Take 650 mg by mouth every 8 (eight) hours as needed for pain.  Marland Kitchen ALPRAZolam (XANAX) 0.25 MG tablet Take 0.125-0.25 mg by mouth at bedtime as needed for anxiety.   Ailene Ards ELLIPTA 62.5-25 MCG/INH AEPB USE 1 INHALATION ONCE DAILY.  Marland Kitchen atorvastatin (LIPITOR) 40 MG tablet TAKE ONE TABLET BY MOUTH ONCE DAILY.  . baclofen (LIORESAL) 10 MG tablet Take 10 mg by mouth as needed for muscle spasms.  . carvedilol (COREG) 3.125 MG tablet Take 1 tablet (3.125 mg total) by mouth 2 (two) times daily with a meal. (Patient taking differently: Take 6.25 mg by mouth 2 (two) times daily with a meal. Pt takes 6.5 mg twice a day.)  . docusate sodium (COLACE) 100 MG capsule Take 100 mg by  mouth every evening.   . donepezil (ARICEPT) 10 MG tablet TAKE ONE TABLET BY MOUTH ONCE DAILY. (Patient taking differently: Take 10 mg by mouth at bedtime.)  . ergocalciferol (VITAMIN D2) 50000 UNITS capsule Take 50,000 Units by mouth every Tuesday.  . ferrous sulfate 325 (65 FE) MG EC tablet Take 325 mg by mouth daily.   . furosemide (LASIX) 40 MG tablet TAKE ONE TABLET BY MOUTH ONCE DAILY.  Marland Kitchen HYDROcodone-acetaminophen (NORCO) 7.5-325 MG tablet Take 1 tablet by mouth every 6 (six) hours as needed for moderate pain.  . hydrocortisone (ANUSOL-HC) 2.5 % rectal cream Place 1 application rectally 3 (three) times daily as needed for hemorrhoids or anal itching.  . levalbuterol (XOPENEX) 0.63 MG/3ML nebulizer solution USE 1 VIAL VIA NEBULIZER EVERY 8 HOURS AS NEEDED.  Marland Kitchen meclizine (ANTIVERT) 25 MG tablet Take 25 mg by mouth 3 (three) times daily as needed for dizziness.  . Melatonin 3 MG TABS Take 1 tablet by mouth at bedtime.  . methylPREDNISolone (MEDROL) 4 MG tablet Take 4 mg by mouth 2 (two) times daily as needed.  . multivitamin-lutein (OCUVITE-LUTEIN) CAPS capsule Take 1 capsule by mouth daily.  . ondansetron (ZOFRAN) 4 MG tablet Take 4 mg by mouth every 8 (eight) hours as needed for nausea or vomiting.  . polyethylene glycol (MIRALAX / GLYCOLAX) 17 g packet Take  17 g by mouth daily as needed for moderate constipation.  . pregabalin (LYRICA) 75 MG capsule TAKE 1 CAPSULE BY MOUTH EVERY OTHER DAY.  Marland Kitchen spironolactone (ALDACTONE) 25 MG tablet TAKE (1/2) TABLET BY MOUTH DAILY.  . traZODone (DESYREL) 50 MG tablet Take 0.5-1 tablets (25-50 mg total) by mouth at bedtime as needed for sleep.  Marland Kitchen warfarin (COUMADIN) 5 MG tablet TAKE 1/2 TO 1 TABLET ONCE DAILY AS DIRECTED BY COUMADIN CLINIC.  Marland Kitchen zinc sulfate 50 MG CAPS capsule Take 50 mg by mouth 2 (two) times daily.    No current facility-administered medications for this visit. (Other)      REVIEW OF SYSTEMS:    ALLERGIES Allergies  Allergen  Reactions  . Codeine Nausea And Vomiting  . Penicillins Other (See Comments)    Unknown reaction - listed on MAR Has patient had a PCN reaction causing immediate rash, facial/tongue/throat swelling, SOB or lightheadedness with hypotension: Unknown Has patient had a PCN reaction causing severe rash involving mucus membranes or skin necrosis: Unknown Has patient had a PCN reaction that required hospitalization: Unknown Has patient had a PCN reaction occurring within the last 10 years: Unknown If all of the above answers are "NO", then may proceed with Cephalosporin use.    PAST MEDICAL HISTORY Past Medical History:  Diagnosis Date  . Anemia   . Anxiety   . Arthritis   . Blood transfusion without reported diagnosis   . Carotid artery stenosis    Without infarction  . Cataract   . Chronic combined systolic (congestive) and diastolic (congestive) heart failure (HCC)   . COPD (chronic obstructive pulmonary disease) (HCC)    Wears 02 at home 2 L  . Coronary artery disease   . Heart valve replaced by other means   . Hypercholesterolemia    Pure  . Hypertension    Unspecified  . LBBB (left bundle branch block)   . Macular degeneration (senile) of retina, unspecified   . On home O2   . Postsurgical aortocoronary bypass status   . Stroke (HCC)   . Transient global amnesia   . Unspecified hereditary and idiopathic peripheral neuropathy   . Unspecified vitamin D deficiency    Past Surgical History:  Procedure Laterality Date  . AORTIC VALVE REPLACEMENT    . APPENDECTOMY    . CORONARY ARTERY BYPASS GRAFT    . ERCP N/A 09/13/2020   Procedure: ENDOSCOPIC RETROGRADE CHOLANGIOPANCREATOGRAPHY (ERCP);  Surgeon: Rachael Fee, MD;  Location: [facility] ENDOSCOPY;  Service: Endoscopy;  Laterality: N/A;  . HIP PINNING,CANNULATED Right 07/18/2018   Procedure: RIGHT CANNULATED HIP PINNING;  Surgeon: Myrene Galas, MD;  Location: [facility] OR;  Service: Orthopedics;  Laterality: Right;  . REMOVAL OF STONES   09/13/2020   Procedure: REMOVAL OF STONES;  Surgeon: Rachael Fee, MD;  Location: [facility] ENDOSCOPY;  Service: Endoscopy;;  . Dennison Mascot  09/13/2020   Procedure: Dennison Mascot;  Surgeon: Rachael Fee, MD;  Location: [facility] ENDOSCOPY;  Service: Endoscopy;;  . TONSILLECTOMY      FAMILY HISTORY Family History  Problem Relation Age of Onset  . Heart attack Father   . Heart disease Father   . Stroke Father   . Stroke Sister   . Hypertension Mother   . Stroke Mother   . Neuropathy Brother   . COPD Sister     SOCIAL HISTORY Social History   Tobacco Use  . Smoking status: Former Smoker    Packs/day: 0.50    Types: Cigarettes  Quit date: 11/30/1991    Years since quitting: 29.1  . Smokeless tobacco: Never Used  . Tobacco comment: Tobacco use-no  Substance Use Topics  . Alcohol use: No  . Drug use: No         OPHTHALMIC EXAM:  Base Eye Exam    Visual Acuity (Snellen - Linear)      Right Left   Dist cc 20/60 20/200   Dist ph cc 20/60 +2 20/100 -2   Correction: Glasses       Tonometry (Tonopen, 10:31 AM)      Right Left   Pressure 11 12       Pupils      Pupils Dark Light Shape React APD   Right PERRL 1 1 Round Minimal None   Left PERRL 1 1 Round Minimal None       Visual Fields (Counting fingers)      Left Right     Full       Neuro/Psych    Oriented x3: Yes   Mood/Affect: Normal       Dilation    Both eyes: 1.0% Mydriacyl, 2.5% Phenylephrine @ 10:31 AM        Slit Lamp and Fundus Exam    External Exam      Right Left   External Normal Normal       Slit Lamp Exam      Right Left   Lids/Lashes Normal Normal   Conjunctiva/Sclera White and quiet White and quiet   Cornea Clear Clear   Anterior Chamber Deep and quiet Deep and quiet   Iris Round and reactive Round and reactive   Lens Clear Centered posterior chamber intraocular lens   Anterior Vitreous Normal Normal       Fundus Exam      Right Left   Posterior Vitreous Posterior  vitreous detachment Posterior vitreous detachment, Central vitreous floaters   Disc Normal Normal, collateral vessels on the nerve   C/D Ratio 0.2 0.3   Macula Hard drusen, Atrophy, no macular thickening, no exudates, no cystoid macular edema,  No Subretinal hemorrhage, Macular thickening   Vessels Normal, occlusion. Normal   Periphery Normal Normal          IMAGING AND PROCEDURES  Imaging and Procedures for 01/08/21  OCT, Retina - OU - Both Eyes       Right Eye Quality was good. Scan locations included subfoveal. Central Foveal Thickness: 253. Progression has been stable. Findings include abnormal foveal contour, retinal drusen .   Left Eye Quality was good. Scan locations included subfoveal. Central Foveal Thickness: 431. Findings include abnormal foveal contour, cystoid macular edema, choroidal neovascular membrane, intraretinal fluid.        Intravitreal Injection, Pharmacologic Agent - OS - Left Eye       Time Out 01/08/2021. 11:05 AM. Confirmed correct patient, procedure, site, and patient consented.   Anesthesia Topical anesthesia was used. Anesthetic medications included Akten 3.5%.   Procedure Preparation included 5% betadine to ocular surface, 10% betadine to eyelids, Tobramycin 0.3%. A 30 gauge needle was used.   Injection:  2 mg aflibercept Gretta Cool(EYLEA) SOLN   NDC: [id number], Lot: [id number]   Route: Intravitreal, Site: Left Eye, Waste: 0 mg  Post-op Post injection exam found visual acuity of at least counting fingers. The patient tolerated the procedure well. There were no complications. The patient received written and verbal post procedure care education. Post injection medications were not given.  ASSESSMENT/PLAN:  Exudative age-related macular degeneration of right eye with inactive choroidal neovascularization (HCC) No signs of active CNVM OD  Exudative age-related macular degeneration of left eye with active choroidal  neovascularization (HCC) Chronically active CNVM left eye, perifoveal, improved since changed to [facility] September 2022 yet still active.  Advanced nonexudative age-related macular degeneration of right eye without subfoveal involvement Accounts for acuity OD,      ICD-10-CM   1. Exudative age-related macular degeneration of left eye with active choroidal neovascularization (HCC)  H35.3221 OCT, Retina - OU - Both Eyes    Intravitreal Injection, Pharmacologic Agent - OS - Left Eye    aflibercept (EYLEA) SOLN 2 mg  2. 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 Advanced nonexudative age-related macular degeneration of right eye without subfoveal involvement  H35.3113     1.  At 7-week follow-up left eye today, on Eylea overall improved as compared to changing from Avastin to [facility] September 2021 however still very active  2.  Repeat injection intravitreal Eylea OS today and examination again in 5 weeks dilate OS next  3.  Ophthalmic Meds Ordered this visit:  Meds ordered this encounter  Medications  . aflibercept (EYLEA) SOLN 2 mg       Return in about 5 weeks (around 02/12/2021) for dilate, OS, EYLEA OCT.  Patient Instructions  Patient instructed to contact the office promptly new onset visual acuity declines or distortions    Explained the diagnoses, plan, and follow up with the patient and they expressed understanding.  Patient expressed understanding of the importance of proper follow up care.   Alford Highland Donne Robillard M.D. Diseases & Surgery of the Retina and Vitreous [facility] 01/08/21     Abbreviations: M myopia (nearsighted); A astigmatism; H hyperopia (farsighted); P presbyopia; Mrx spectacle prescription;  CTL contact lenses; OD right eye; OS left eye; OU both eyes  XT exotropia; ET esotropia; PEK punctate epithelial keratitis; PEE punctate epithelial erosions; DES dry eye  syndrome; MGD meibomian gland dysfunction; ATs artificial tears; PFAT's preservative free artificial tears; NSC nuclear sclerotic cataract; PSC posterior subcapsular cataract; ERM epi-retinal membrane; PVD posterior vitreous detachment; RD retinal detachment; DM diabetes mellitus; DR diabetic retinopathy; NPDR non-proliferative diabetic retinopathy; PDR proliferative diabetic retinopathy; CSME clinically significant macular edema; DME diabetic macular edema; dbh dot blot hemorrhages; CWS cotton wool spot; POAG primary open angle glaucoma; C/D cup-to-disc ratio; HVF humphrey visual field; GVF goldmann visual field; OCT optical coherence tomography; IOP intraocular pressure; BRVO Branch retinal vein occlusion; CRVO central retinal vein occlusion; CRAO central retinal artery occlusion; BRAO branch retinal artery occlusion; RT retinal tear; SB scleral buckle; PPV pars plana vitrectomy; VH Vitreous hemorrhage; PRP panretinal laser photocoagulation; IVK intravitreal kenalog; VMT vitreomacular traction; MH Macular hole;  NVD neovascularization of the disc; NVE neovascularization elsewhere; AREDS age related eye disease study; ARMD age related macular degeneration; POAG primary open angle glaucoma; EBMD epithelial/anterior basement membrane dystrophy; ACIOL anterior chamber intraocular lens; IOL intraocular lens; PCIOL posterior chamber intraocular lens; Phaco/IOL phacoemulsification with intraocular lens placement; PRK photorefractive keratectomy; LASIK laser assisted in situ keratomileusis; HTN hypertension; DM diabetes mellitus; COPD chronic obstructive pulmonary disease

## 2021-01-08 NOTE — Assessment & Plan Note (Signed)
No signs of active CNVM OD 

## 2021-01-08 NOTE — Assessment & Plan Note (Signed)
Chronically active CNVM left eye, perifoveal, improved since changed to [facility] September 2022 yet still active.

## 2021-01-08 NOTE — Patient Instructions (Signed)
Patient instructed to contact the office promptly new onset visual acuity declines or distortions 

## 2021-01-08 NOTE — Assessment & Plan Note (Signed)
Accounts for acuity OD,

## 2021-01-13 ENCOUNTER — Other Ambulatory Visit: Payer: Medicare Other

## 2021-01-13 ENCOUNTER — Other Ambulatory Visit: Payer: Self-pay

## 2021-01-15 ENCOUNTER — Other Ambulatory Visit: Payer: Medicare Other

## 2021-01-15 ENCOUNTER — Ambulatory Visit (INDEPENDENT_AMBULATORY_CARE_PROVIDER_SITE_OTHER): Payer: Medicare Other | Admitting: Cardiovascular Disease

## 2021-01-15 ENCOUNTER — Other Ambulatory Visit: Payer: Self-pay

## 2021-01-15 DIAGNOSIS — Z952 Presence of prosthetic heart valve: Secondary | ICD-10-CM | POA: Diagnosis not present

## 2021-01-15 DIAGNOSIS — Z5181 Encounter for therapeutic drug level monitoring: Secondary | ICD-10-CM | POA: Diagnosis not present

## 2021-01-15 DIAGNOSIS — I359 Nonrheumatic aortic valve disorder, unspecified: Secondary | ICD-10-CM

## 2021-01-15 DIAGNOSIS — Z515 Encounter for palliative care: Secondary | ICD-10-CM

## 2021-01-15 DIAGNOSIS — I6350 Cerebral infarction due to unspecified occlusion or stenosis of unspecified cerebral artery: Secondary | ICD-10-CM

## 2021-01-15 LAB — POCT INR: INR: 2.5 (ref 2.0–3.0)

## 2021-01-15 NOTE — Progress Notes (Signed)
COMMUNITY PALLIATIVE CARE SW NOTE  PATIENT NAME: [name] DOB: 1926-05-25 MRN: [id number]  PRIMARY CARE PROVIDER: Lucia Gaskins, MD  RESPONSIBLE PARTY:  Acct ID - Guarantor Home Phone Work Phone Relationship Acct Type  [id number] - [phone]  Self P/F     [address], [place], [place] [address]     PLAN OF CARE and INTERVENTIONS:             1. GOALS OF CARE/ ADVANCE CARE PLANNING:  Patient is a DNR. Patient's goal is to remain at home as independent as possible.  2.         SOCIAL/EMOTIONAL/SPIRITUAL ASSESSMENT/ INTERVENTIONS:  SW met with patient and patient's live-in caregiver, [name], in patients home for routine monthly visit. Patient lives in a one story home. Patient has chronic COPD and respiratory failure.   Patient and caregiver updated SW medical condition and changes.  Patient is stable from previous month. Patient has upcoming PCP appt 3/29.  Patient continues on 4LPM as needed. Patient shared that she and he caregiver continue to go out daily for lunch and that she goes to local gym M,W,F. No falls reported. No pain reported.   No recent medication changes. Patient's caregiver checks Coumadin at home. Caregiver also takes patients vitals and weights daily, weights have been stable, current weight is 110lbs. Patient takes Lasix Qmorning.  SW discussed goals, reviewed care plan, provided emotional support, used active and reflective listening. Palliative care will continue to monitor and assist with long term care planning as needed.   3.         PATIENT/CAREGIVER EDUCATION/ COPING:  Patient continues with active lifestyle of going to the gym and riding the stationary bike, going out to lunch daily and going on leisure outings. Patient A&O x3, Pt is HOH but able to answer questions appropriately. Patient denies any anxiety or depression, patient takes Xanax HS. Patients family is supportive  4.         PERSONAL EMERGENCY PLAN:  Patient will call [phone]  for emergencies.   5.         COMMUNITY RESOURCES COORDINATION/ HEALTH CARE NAVIGATION:  Patient and patients daughter manages her care.  6.         FINANCIAL/LEGAL CONCERNS/INTERVENTIONS:  None.      SOCIAL HX:  Social History   Tobacco Use  . Smoking status: Former Smoker    Packs/day: 0.50    Types: Cigarettes    Quit date: 11/30/1991    Years since quitting: 29.1  . Smokeless tobacco: Never Used  . Tobacco comment: Tobacco use-no  Substance Use Topics  . Alcohol use: No    CODE STATUS: DNR ADVANCED DIRECTIVES: Y MOST FORM COMPLETE:  N HOSPICE EDUCATION PROVIDED: N  PPS: Patient independent with all ADLs. Caregiver meal preps. Patient has RW, SPC, and shower needed.    Time spent: 40 min      [place] Henrene Pastor, [place]

## 2021-01-29 ENCOUNTER — Ambulatory Visit (INDEPENDENT_AMBULATORY_CARE_PROVIDER_SITE_OTHER): Payer: Medicare Other | Admitting: Internal Medicine

## 2021-01-29 DIAGNOSIS — I359 Nonrheumatic aortic valve disorder, unspecified: Secondary | ICD-10-CM

## 2021-01-29 DIAGNOSIS — I6350 Cerebral infarction due to unspecified occlusion or stenosis of unspecified cerebral artery: Secondary | ICD-10-CM

## 2021-01-29 DIAGNOSIS — Z5181 Encounter for therapeutic drug level monitoring: Secondary | ICD-10-CM | POA: Diagnosis not present

## 2021-01-29 DIAGNOSIS — Z952 Presence of prosthetic heart valve: Secondary | ICD-10-CM

## 2021-01-29 LAB — POCT INR: INR: 2.5 (ref 2.0–3.0)

## 2021-02-12 ENCOUNTER — Ambulatory Visit (INDEPENDENT_AMBULATORY_CARE_PROVIDER_SITE_OTHER): Payer: Medicare Other | Admitting: Cardiology

## 2021-02-12 ENCOUNTER — Encounter (INDEPENDENT_AMBULATORY_CARE_PROVIDER_SITE_OTHER): Payer: Self-pay | Admitting: Ophthalmology

## 2021-02-12 ENCOUNTER — Ambulatory Visit (INDEPENDENT_AMBULATORY_CARE_PROVIDER_SITE_OTHER): Payer: Medicare Other | Admitting: Ophthalmology

## 2021-02-12 ENCOUNTER — Other Ambulatory Visit: Payer: Self-pay

## 2021-02-12 DIAGNOSIS — Z5181 Encounter for therapeutic drug level monitoring: Secondary | ICD-10-CM

## 2021-02-12 DIAGNOSIS — H353221 Exudative age-related macular degeneration, left eye, with active choroidal neovascularization: Secondary | ICD-10-CM

## 2021-02-12 DIAGNOSIS — Z952 Presence of prosthetic heart valve: Secondary | ICD-10-CM

## 2021-02-12 DIAGNOSIS — I6350 Cerebral infarction due to unspecified occlusion or stenosis of unspecified cerebral artery: Secondary | ICD-10-CM

## 2021-02-12 DIAGNOSIS — I359 Nonrheumatic aortic valve disorder, unspecified: Secondary | ICD-10-CM | POA: Diagnosis not present

## 2021-02-12 LAB — POCT INR: INR: 2.3 (ref 2.0–3.0)

## 2021-02-12 MED ORDER — AFLIBERCEPT 2MG/0.05ML IZ SOLN FOR KALEIDOSCOPE
2.0000 mg | INTRAVITREAL | Status: AC | PRN
  Administered 2021-02-12: 2 mg via INTRAVITREAL

## 2021-02-12 NOTE — Assessment & Plan Note (Signed)
Improved OS, at 5-week follow-up repeat Eylea today

## 2021-02-12 NOTE — Progress Notes (Signed)
02/12/2021     CHIEF COMPLAINT Patient presents for Retina Follow Up (5 Week Wet AMD f\u OS. Possible Eylea OS. OCT/Pt states vision is not good.)   HISTORY OF PRESENT ILLNESS: [name] is a 87 y.o. female who presents to the clinic today for:   HPI    Retina Follow Up    Patient presents with  Wet AMD.  In left eye.  Severity is moderate.  Duration of 5 weeks.  Since onset it is stable. Additional comments: 5 Week Wet AMD f\u OS. Possible Eylea OS. OCT Pt states vision is not good.       Last edited by Elyse Jarvis on 02/12/2021 10:03 AM. (History)      Referring physician: Oval Linsey, MD [address] [place],  [place] [address]  HISTORICAL INFORMATION:   Selected notes from the MEDICAL RECORD NUMBER    Lab Results  Component Value Date   HGBA1C 5.7 (H) 10/27/2019     CURRENT MEDICATIONS: No current outpatient medications on file. (Ophthalmic Drugs)   No current facility-administered medications for this visit. (Ophthalmic Drugs)   Current Outpatient Medications (Other)  Medication Sig  . acetaminophen (TYLENOL) 650 MG CR tablet Take 650 mg by mouth every 8 (eight) hours as needed for pain.  Marland Kitchen ALPRAZolam (XANAX) 0.25 MG tablet Take 0.125-0.25 mg by mouth at bedtime as needed for anxiety.   Ailene Ards ELLIPTA 62.5-25 MCG/INH AEPB USE 1 INHALATION ONCE DAILY.  Marland Kitchen atorvastatin (LIPITOR) 40 MG tablet TAKE ONE TABLET BY MOUTH ONCE DAILY.  . baclofen (LIORESAL) 10 MG tablet Take 10 mg by mouth as needed for muscle spasms.  . carvedilol (COREG) 3.125 MG tablet Take 1 tablet (3.125 mg total) by mouth 2 (two) times daily with a meal. (Patient taking differently: Take 6.25 mg by mouth 2 (two) times daily with a meal. Pt takes 6.5 mg twice a day.)  . docusate sodium (COLACE) 100 MG capsule Take 100 mg by mouth every evening.   . donepezil (ARICEPT) 10 MG tablet TAKE ONE TABLET BY MOUTH ONCE DAILY. (Patient taking differently: Take 10 mg by mouth at bedtime.)   . ergocalciferol (VITAMIN D2) 50000 UNITS capsule Take 50,000 Units by mouth every Tuesday.  . ferrous sulfate 325 (65 FE) MG EC tablet Take 325 mg by mouth daily.   . furosemide (LASIX) 40 MG tablet TAKE ONE TABLET BY MOUTH ONCE DAILY.  Marland Kitchen HYDROcodone-acetaminophen (NORCO) 7.5-325 MG tablet Take 1 tablet by mouth every 6 (six) hours as needed for moderate pain.  . hydrocortisone (ANUSOL-HC) 2.5 % rectal cream Place 1 application rectally 3 (three) times daily as needed for hemorrhoids or anal itching.  . levalbuterol (XOPENEX) 0.63 MG/3ML nebulizer solution USE 1 VIAL VIA NEBULIZER EVERY 8 HOURS AS NEEDED.  Marland Kitchen meclizine (ANTIVERT) 25 MG tablet Take 25 mg by mouth 3 (three) times daily as needed for dizziness.  . Melatonin 3 MG TABS Take 1 tablet by mouth at bedtime.  . methylPREDNISolone (MEDROL) 4 MG tablet Take 4 mg by mouth 2 (two) times daily as needed.  . multivitamin-lutein (OCUVITE-LUTEIN) CAPS capsule Take 1 capsule by mouth daily.  . ondansetron (ZOFRAN) 4 MG tablet Take 4 mg by mouth every 8 (eight) hours as needed for nausea or vomiting.  . polyethylene glycol (MIRALAX / GLYCOLAX) 17 g packet Take 17 g by mouth daily as needed for moderate constipation.  . pregabalin (LYRICA) 75 MG capsule TAKE 1 CAPSULE BY MOUTH EVERY OTHER DAY.  Marland Kitchen  spironolactone (ALDACTONE) 25 MG tablet TAKE (1/2) TABLET BY MOUTH DAILY.  . traZODone (DESYREL) 50 MG tablet Take 0.5-1 tablets (25-50 mg total) by mouth at bedtime as needed for sleep.  Marland Kitchen warfarin (COUMADIN) 5 MG tablet TAKE 1/2 TO 1 TABLET ONCE DAILY AS DIRECTED BY COUMADIN CLINIC.  Marland Kitchen zinc sulfate 50 MG CAPS capsule Take 50 mg by mouth 2 (two) times daily.    No current facility-administered medications for this visit. (Other)      REVIEW OF SYSTEMS:    ALLERGIES Allergies  Allergen Reactions  . Codeine Nausea And Vomiting  . Penicillins Other (See Comments)    Unknown reaction - listed on MAR Has patient had a PCN reaction causing  immediate rash, facial/tongue/throat swelling, SOB or lightheadedness with hypotension: Unknown Has patient had a PCN reaction causing severe rash involving mucus membranes or skin necrosis: Unknown Has patient had a PCN reaction that required hospitalization: Unknown Has patient had a PCN reaction occurring within the last 10 years: Unknown If all of the above answers are "NO", then may proceed with Cephalosporin use.    PAST MEDICAL HISTORY Past Medical History:  Diagnosis Date  . Anemia   . Anxiety   . Arthritis   . Blood transfusion without reported diagnosis   . Carotid artery stenosis    Without infarction  . Cataract   . Chronic combined systolic (congestive) and diastolic (congestive) heart failure (HCC)   . COPD (chronic obstructive pulmonary disease) (HCC)    Wears 02 at home 2 L  . Coronary artery disease   . Heart valve replaced by other means   . Hypercholesterolemia    Pure  . Hypertension    Unspecified  . LBBB (left bundle branch block)   . Macular degeneration (senile) of retina, unspecified   . On home O2   . Postsurgical aortocoronary bypass status   . Stroke (HCC)   . Transient global amnesia   . Unspecified hereditary and idiopathic peripheral neuropathy   . Unspecified vitamin D deficiency    Past Surgical History:  Procedure Laterality Date  . AORTIC VALVE REPLACEMENT    . APPENDECTOMY    . CORONARY ARTERY BYPASS GRAFT    . ERCP N/A 09/13/2020   Procedure: ENDOSCOPIC RETROGRADE CHOLANGIOPANCREATOGRAPHY (ERCP);  Surgeon: Rachael Fee, MD;  Location: [facility] ENDOSCOPY;  Service: Endoscopy;  Laterality: N/A;  . HIP PINNING,CANNULATED Right 07/18/2018   Procedure: RIGHT CANNULATED HIP PINNING;  Surgeon: Myrene Galas, MD;  Location: [facility] OR;  Service: Orthopedics;  Laterality: Right;  . REMOVAL OF STONES  09/13/2020   Procedure: REMOVAL OF STONES;  Surgeon: Rachael Fee, MD;  Location: [facility] ENDOSCOPY;  Service: Endoscopy;;  . Dennison Mascot  09/13/2020    Procedure: Dennison Mascot;  Surgeon: Rachael Fee, MD;  Location: [facility] ENDOSCOPY;  Service: Endoscopy;;  . TONSILLECTOMY      FAMILY HISTORY Family History  Problem Relation Age of Onset  . Heart attack Father   . Heart disease Father   . Stroke Father   . Stroke Sister   . Hypertension Mother   . Stroke Mother   . Neuropathy Brother   . COPD Sister     SOCIAL HISTORY Social History   Tobacco Use  . Smoking status: Former Smoker    Packs/day: 0.50    Types: Cigarettes    Quit date: 11/30/1991    Years since quitting: 29.2  . Smokeless tobacco: Never Used  . Tobacco comment: Tobacco use-no  Substance Use Topics  .  Alcohol use: No  . Drug use: No         OPHTHALMIC EXAM:  Base Eye Exam    Visual Acuity (Snellen - Linear)      Right Left   Dist cc 20/100 -2 20/100 -2   Dist ph cc 20/80 -1 20/80 -2   Correction: Glasses       Tonometry (Tonopen, 10:09 AM)      Right Left   Pressure 9 10       Pupils      Pupils Dark Light Shape React APD   Right PERRL 1 1 Round Minimal None   Left PERRL 1 1 Round Minimal None       Visual Fields (Counting fingers)      Left Right    Full Full       Neuro/Psych    Oriented x3: Yes   Mood/Affect: Normal       Dilation    Left eye: 1.0% Mydriacyl, 2.5% Phenylephrine @ 10:09 AM        Slit Lamp and Fundus Exam    External Exam      Right Left   External Normal Normal       Slit Lamp Exam      Right Left   Lids/Lashes Normal Normal   Conjunctiva/Sclera White and quiet White and quiet   Cornea Clear Clear   Anterior Chamber Deep and quiet Deep and quiet   Iris Round and reactive Round and reactive   Lens Clear Centered posterior chamber intraocular lens   Anterior Vitreous Normal Normal       Fundus Exam      Right Left   Posterior Vitreous  Posterior vitreous detachment, Central vitreous floaters   Disc  Normal, collateral vessels on the nerve   C/D Ratio  0.35   Macula  No Subretinal  hemorrhage, Macular thickening   Vessels  Normal   Periphery  Normal          IMAGING AND PROCEDURES  Imaging and Procedures for 02/12/21  OCT, Retina - OU - Both Eyes       Right Eye Quality was good. Scan locations included subfoveal. Central Foveal Thickness: 279. Progression has been stable.   Left Eye Quality was good. Scan locations included subfoveal. Central Foveal Thickness: 297. Progression has improved.   Notes OD, stable overall  OS with much improved perifoveal wet AMD post Eylea, 5-week follow-up.       Intravitreal Injection, Pharmacologic Agent - OS - Left Eye       Time Out 02/12/2021. 11:15 AM. Confirmed correct patient, procedure, site, and patient consented.   Anesthesia Topical anesthesia was used. Anesthetic medications included Akten 3.5%.   Procedure Preparation included 5% betadine to ocular surface, 10% betadine to eyelids, Tobramycin 0.3%. A 30 gauge needle was used.   Injection:  2 mg aflibercept Gretta Cool) SOLN   NDC: [id number], Lot: [id number]   Route: Intravitreal, Site: Left Eye, Waste: 0 mg  Post-op Post injection exam found visual acuity of at least counting fingers. The patient tolerated the procedure well. There were no complications. The patient received written and verbal post procedure care education. Post injection medications were not given.                 ASSESSMENT/PLAN:  Exudative age-related macular degeneration of left eye with active choroidal neovascularization (HCC) Improved OS, at 5-week follow-up repeat Eylea today      ICD-10-CM   1. Exudative age-related  macular degeneration of left eye with active choroidal neovascularization (HCC)  H35.3221 OCT, Retina - OU - Both Eyes    Intravitreal Injection, Pharmacologic Agent - OS - Left Eye    aflibercept (EYLEA) SOLN 2 mg    1.  2.  3.  Ophthalmic Meds Ordered this visit:  Meds ordered this encounter  Medications  . aflibercept (EYLEA) SOLN 2  mg       Return in about 6 weeks (around 03/26/2021) for dilate, OS, EYLEA OCT.  There are no Patient Instructions on file for this visit.   Explained the diagnoses, plan, and follow up with the patient and they expressed understanding.  Patient expressed understanding of the importance of proper follow up care.   Alford HighlandGary A. Dareen Gutzwiller M.D. Diseases & Surgery of the Retina and Vitreous [facility] 02/12/21     Abbreviations: M myopia (nearsighted); A astigmatism; H hyperopia (farsighted); P presbyopia; Mrx spectacle prescription;  CTL contact lenses; OD right eye; OS left eye; OU both eyes  XT exotropia; ET esotropia; PEK punctate epithelial keratitis; PEE punctate epithelial erosions; DES dry eye syndrome; MGD meibomian gland dysfunction; ATs artificial tears; PFAT's preservative free artificial tears; NSC nuclear sclerotic cataract; PSC posterior subcapsular cataract; ERM epi-retinal membrane; PVD posterior vitreous detachment; RD retinal detachment; DM diabetes mellitus; DR diabetic retinopathy; NPDR non-proliferative diabetic retinopathy; PDR proliferative diabetic retinopathy; CSME clinically significant macular edema; DME diabetic macular edema; dbh dot blot hemorrhages; CWS cotton wool spot; POAG primary open angle glaucoma; C/D cup-to-disc ratio; HVF humphrey visual field; GVF goldmann visual field; OCT optical coherence tomography; IOP intraocular pressure; BRVO Branch retinal vein occlusion; CRVO central retinal vein occlusion; CRAO central retinal artery occlusion; BRAO branch retinal artery occlusion; RT retinal tear; SB scleral buckle; PPV pars plana vitrectomy; VH Vitreous hemorrhage; PRP panretinal laser photocoagulation; IVK intravitreal kenalog; VMT vitreomacular traction; MH Macular hole;  NVD neovascularization of the disc; NVE neovascularization elsewhere; AREDS age related eye disease study; ARMD age related macular degeneration; POAG primary open angle glaucoma;  EBMD epithelial/anterior basement membrane dystrophy; ACIOL anterior chamber intraocular lens; IOL intraocular lens; PCIOL posterior chamber intraocular lens; Phaco/IOL phacoemulsification with intraocular lens placement; PRK photorefractive keratectomy; LASIK laser assisted in situ keratomileusis; HTN hypertension; DM diabetes mellitus; COPD chronic obstructive pulmonary disease

## 2021-02-20 ENCOUNTER — Other Ambulatory Visit: Payer: Self-pay

## 2021-02-20 ENCOUNTER — Other Ambulatory Visit: Payer: Medicare Other

## 2021-02-20 VITALS — BP 120/50 | HR 67 | Temp 97.6°F | Resp 20 | Wt 110.0 lb

## 2021-02-20 DIAGNOSIS — Z515 Encounter for palliative care: Secondary | ICD-10-CM

## 2021-02-20 NOTE — Progress Notes (Signed)
PATIENT NAME: [name] DOB: 1926-09-17 MRN: [id number]  PRIMARY CARE PROVIDER: Lucia Gaskins, MD  RESPONSIBLE PARTY:  Acct ID - Guarantor Home Phone Work Phone Relationship Acct Type  [id number] [name]  Self P/F     [name], [place], [place] [address]    PLAN OF CARE and INTERVENTIONS:               1.  GOALS OF CARE/ ADVANCE CARE PLANNING:  Patient desires to remain home with the assistance of her private caregiver.               2.  PATIENT/CAREGIVER EDUCATION:  CHF symptoms and use of trazodone for sleep.               4. PERSONAL EMERGENCY PLAN:  Activate 911 for emergencies.               5.  DISEASE STATUS:  Joint visit completed with [name], SW, patient and [name]-caregiver.  Patient is in good spirits and engaging in conversation.  She is noted to have some short term memory deficits.  Caregiver reports patient is having more frequent bad days.  She is fatigued and weak on these days.  She will occasionally sleep off and on during the day.  Discussion completed on s/s of CHF and COPD.  Caregiver notes patient's O2 sats have decreased to 88% on 3L via [place] while at rest. Patient typically runs 95-100% on 3L.  No coughing or sputum production is noted.  Patient has taken an extra dose of lasix 2 x this week due to her weight being 111 lbs.   Patient denies any issues with pain.  No falls are reported.  Patient denies any dizziness, or nausea/vomitting.   Chronic insomnia is reported by patient and caregiver.  She is taking 1/2 of xanax but does not feel this is helpful most nights.  She has occasionally taken 1 tab but also did not see any improvements.  Patient has trazodone listed on her medication profile but caregiver states this has not been used.  She will start patient on 25 mg to see if patient is able to sleep better.   Patient remains quite active and is going to the gym 3 x a week.  She eats out daily for lunch and will occasionally due  shopping in town.    HISTORY OF PRESENT ILLNESS:  87 year old female with COPD and CHF.  Patient is being followed by Palliative Care monthly and PRN.  CODE STATUS: DNR ADVANCED DIRECTIVES: No MOST FORM: No PPS: 50%   PHYSICAL EXAM:   VITALS: Today's Vitals   02/20/21 0907  BP: (!) 120/50  Pulse: 67  Resp: 20  Temp: 97.6 F (36.4 C)  SpO2: 95%  Weight: 110 lb (49.9 kg)  PainSc: 0-No pain    LUNGS: Diminished but clear. No wheezing, rhonchi or rales present. CARDIAC: HRR EXTREMITIES: no edema present.  SKIN: warm and dry to touch.  No skin breakdown  NEURO:  Alert and oriented with some forgetfulness.       Lorenza Burton, RN

## 2021-02-20 NOTE — Progress Notes (Signed)
COMMUNITY PALLIATIVE CARE SW NOTE  PATIENT NAME: [name] DOB: 03/06/1926 MRN: [id number]  PRIMARY CARE PROVIDER: Lucia Gaskins, MD  RESPONSIBLE PARTY:  Acct ID - Guarantor Home Phone Work Phone Relationship Acct Type  [id number] - [name],RU626 [phone]  Self P/F     [name], [place], [place] [address]     PLAN OF CARE and INTERVENTIONS:             1. GOALS OF CARE/ ADVANCE CARE PLANNING:  Patient is a DNR. Patients daughter, [name], is POA. Patient's goal is to remain at home as independent as possible.   2.         SOCIAL/EMOTIONAL/SPIRITUAL ASSESSMENT/ INTERVENTIONS:  SW and RN met with patient and patient's live-in caregiver, [name], in patient's home for routine monthly visit. Patient lives in a one story home. Patient has chronic COPD and respiratory failure.    Patient in spirits today. Patient and caregiver updated SW medical condition and changes.  Patient is stable from previous month. However, caregiver shares that patient has been a bit weaker and complaining of not feeling good. Caregiver shares that patients oxygen has been reading lower than normal at rest. Caregiver has noticed some memory deficits lately, but progressive.    Patient continues on 4LPM as needed. Caregiver gives patient nebulizer treatments at HS and PRN. Patient shared that she and he caregiver continue to go out daily for lunch and that she goes to local gym M,W,F. No falls reported. No pain reported.  Patient has difficulty sleeping some nights.   RN checked vitals. No recent medication changes. Patient's caregiver checks Coumadin at home. Caregiver also takes patients vitals and weights daily, weights have been stable, current weight is 110lbs. Patient takes Lasix Qmorning.   SW discussed goals, reviewed care plan, provided emotional support, used active and reflective listening. Palliative care will continue to monitor and assist with long term care planning as needed.    3.          PATIENT/CAREGIVER EDUCATION/ COPING:  Patient continues with active lifestyle of going to the gym and riding the stationary bike, going out to lunch daily and going on leisure outings. Patient A&O x3, Pt is HOH but able to answer questions appropriately. Patient denies any anxiety or depression, patient takes Xanax HS. Patients family is supportive   4.         PERSONAL EMERGENCY PLAN:  Patient will call 9-1-1 for emergencies.    5.         COMMUNITY RESOURCES COORDINATION/ HEALTH CARE NAVIGATION:  Patient and patients daughter manages her care.   6.         FINANCIAL/LEGAL CONCERNS/INTERVENTIONS:  None.      SOCIAL HX:  Social History   Tobacco Use  . Smoking status: Former Smoker    Packs/day: 0.50    Types: Cigarettes    Quit date: 11/30/1991    Years since quitting: 29.2  . Smokeless tobacco: Never Used  . Tobacco comment: Tobacco use-no  Substance Use Topics  . Alcohol use: No    CODE STATUS: DNR  ADVANCED DIRECTIVES: Y MOST FORM COMPLETE: N HOSPICE EDUCATION PROVIDED: N  [id number] independent with all ADLs. Caregiver meal preps. Patient has RW, SPC, and shower needed.       Doreene Eland, [place]

## 2021-02-26 ENCOUNTER — Other Ambulatory Visit: Payer: Self-pay | Admitting: Cardiology

## 2021-02-26 ENCOUNTER — Ambulatory Visit (INDEPENDENT_AMBULATORY_CARE_PROVIDER_SITE_OTHER): Payer: Medicare Other | Admitting: Internal Medicine

## 2021-02-26 DIAGNOSIS — Z952 Presence of prosthetic heart valve: Secondary | ICD-10-CM | POA: Diagnosis not present

## 2021-02-26 DIAGNOSIS — I6350 Cerebral infarction due to unspecified occlusion or stenosis of unspecified cerebral artery: Secondary | ICD-10-CM

## 2021-02-26 DIAGNOSIS — Z5181 Encounter for therapeutic drug level monitoring: Secondary | ICD-10-CM

## 2021-02-26 DIAGNOSIS — I359 Nonrheumatic aortic valve disorder, unspecified: Secondary | ICD-10-CM

## 2021-02-26 LAB — POCT INR: INR: 2.3 (ref 2.0–3.0)

## 2021-02-27 ENCOUNTER — Other Ambulatory Visit: Payer: Medicare Other

## 2021-02-27 VITALS — BP 142/50 | HR 77 | Temp 98.4°F | Resp 20

## 2021-02-27 DIAGNOSIS — Z515 Encounter for palliative care: Secondary | ICD-10-CM

## 2021-02-28 NOTE — Progress Notes (Signed)
PATIENT NAME: [name] DOB: 21-Jul-1926 MRN: [id number]  PRIMARY CARE PROVIDER: Lucia Gaskins, MD  RESPONSIBLE PARTY:  Acct ID - Guarantor Home Phone Work Phone Relationship Acct Type  [id number] [name]  Self P/F     [address], [place], [place] [address]    PLAN OF CARE and INTERVENTIONS:               1.  GOALS OF CARE/ ADVANCE CARE PLANNING:  Remain home with 24/7 care.               2.  PATIENT/CAREGIVER EDUCATION:  Dehydration and UTI               4. PERSONAL EMERGENCY PLAN:  Activate 911 for emergencies.               5.  DISEASE STATUS:  Joint visit with [name]-caregiver and patient.  Patient with decrease urinary output, decrease fluid/po intake, increase in confusion and blank stare noted by family during the last 2 days.  Patient found in the recliner chair reading her book.  Patient is engaging in conversation but requires assistance from her caregiver to recall information.  Patient states she does have some urinary urgency when she first wakes up in the morning but no further issues.  Sometimes she does not feel she is emptying her bladder.  I have palpated her bladder which is negative for distention.  Patient then needed to urinate.  Assisted patient to the bathroom and she is continent.  Urine is clear without odor.  Patient denies frequency or burning with urination.  Patient with equal hand grips, no slurred speech and no facial drooping present.  PT/INT obtained yesterday and reading was 2.5.  Patient continues on coumadin therapy.  Per caregiver, patient has received 3 extra doses of Lasix this week.  Education provided to patient on the importance of drinking adequate amount of liquids.  Patient did not recall a decrease in liquid intake but verbalizes understanding.   I have reviewed the s/s of UTI and advised that should patient present with symptoms PCP should be notified for treatment.  Caregiver and patient voiced understanding.  I  contacted Charlann Boxer, NP for Palliative Care to consult on the above. Patient likely experiencing symptoms of dehydration.  Re-enforce balancing fluid intake with CHF and diuretics.  Phone call made to [name]-daughter and reviewed above.  Family is aware patient can be transported to the ED for evaluation should symptom worsen.  HISTORY OF PRESENT ILLNESS:  87 year old female with COPD and CHF.  Patient is being followed by Palliative Care monthly and PRN.  CODE STATUS: DNR ADVANCED DIRECTIVES: N MOST FORM: No PPS: 50%   PHYSICAL EXAM:   VITALS: Today's Vitals   02/28/21 1802  BP: (!) 142/50  Pulse: 77  Resp: 20  Temp: 98.4 F (36.9 C)  SpO2: 94%  PainSc: 0-No pain    LUNGS: CTA, No rales, rhonchi or wheezes present. CARDIAC: HRR EXTREMITIES: - for edema SKIN: warm and dry to touch.  NEURO: alert and oriented x 3.  + forgetfulness.       Lorenza Burton, RN

## 2021-03-02 ENCOUNTER — Other Ambulatory Visit: Payer: Self-pay

## 2021-03-12 ENCOUNTER — Ambulatory Visit (INDEPENDENT_AMBULATORY_CARE_PROVIDER_SITE_OTHER): Payer: Medicare Other | Admitting: Cardiology

## 2021-03-12 DIAGNOSIS — Z5181 Encounter for therapeutic drug level monitoring: Secondary | ICD-10-CM | POA: Diagnosis not present

## 2021-03-12 DIAGNOSIS — I359 Nonrheumatic aortic valve disorder, unspecified: Secondary | ICD-10-CM

## 2021-03-12 DIAGNOSIS — I6350 Cerebral infarction due to unspecified occlusion or stenosis of unspecified cerebral artery: Secondary | ICD-10-CM

## 2021-03-12 DIAGNOSIS — Z952 Presence of prosthetic heart valve: Secondary | ICD-10-CM

## 2021-03-12 LAB — POCT INR: INR: 2.6 (ref 2.0–3.0)

## 2021-03-24 ENCOUNTER — Ambulatory Visit (INDEPENDENT_AMBULATORY_CARE_PROVIDER_SITE_OTHER): Payer: Medicare Other | Admitting: Ophthalmology

## 2021-03-24 ENCOUNTER — Encounter (INDEPENDENT_AMBULATORY_CARE_PROVIDER_SITE_OTHER): Payer: Self-pay | Admitting: Ophthalmology

## 2021-03-24 ENCOUNTER — Other Ambulatory Visit: Payer: Self-pay

## 2021-03-24 DIAGNOSIS — H353212 Exudative age-related macular degeneration, right eye, with inactive choroidal neovascularization: Secondary | ICD-10-CM

## 2021-03-24 DIAGNOSIS — H353113 Nonexudative age-related macular degeneration, right eye, advanced atrophic without subfoveal involvement: Secondary | ICD-10-CM

## 2021-03-24 DIAGNOSIS — H353221 Exudative age-related macular degeneration, left eye, with active choroidal neovascularization: Secondary | ICD-10-CM | POA: Diagnosis not present

## 2021-03-24 MED ORDER — AFLIBERCEPT 2MG/0.05ML IZ SOLN FOR KALEIDOSCOPE
2.0000 mg | INTRAVITREAL | Status: AC | PRN
  Administered 2021-03-24: 2 mg via INTRAVITREAL

## 2021-03-24 NOTE — Assessment & Plan Note (Signed)
No signs of recurrence OD 

## 2021-03-24 NOTE — Progress Notes (Signed)
03/24/2021     CHIEF COMPLAINT Patient presents for Retina Follow Up (6 Week F/U OS, poss Eylea OS//Pt sts near [place] OU is worsening, and she is having trouble reading fine print. No new symptoms reported.)   HISTORY OF PRESENT ILLNESS: [name] is a 87 y.o. female who presents to the clinic today for:   HPI    Retina Follow Up    Diagnosis: Wet AMD   Laterality: left eye   Onset: 6 weeks ago   Severity: moderate   Duration: 6 weeks   Course: gradually worsening   Comments: 6 Week F/U OS, poss Eylea OS  Pt sts near [place] OU is worsening, and she is having trouble reading fine print. No new symptoms reported.       Last edited by Ileana Roup, COA on 03/24/2021 10:43 AM. (History)      Referring physician: Oval Linsey, MD [address] ,  [place] [address]  HISTORICAL INFORMATION:   Selected notes from the MEDICAL RECORD NUMBER    Lab Results  Component Value Date   HGBA1C 5.7 (H) 10/27/2019     CURRENT MEDICATIONS: No current outpatient medications on file. (Ophthalmic Drugs)   No current facility-administered medications for this visit. (Ophthalmic Drugs)   Current Outpatient Medications (Other)  Medication Sig  . acetaminophen (TYLENOL) 650 MG CR tablet Take 650 mg by mouth every 8 (eight) hours as needed for pain.  Marland Kitchen ALPRAZolam (XANAX) 0.25 MG tablet Take 0.125-0.25 mg by mouth at bedtime as needed for anxiety.   Ailene Ards ELLIPTA 62.5-25 MCG/INH AEPB USE 1 INHALATION ONCE DAILY.  Marland Kitchen atorvastatin (LIPITOR) 40 MG tablet TAKE ONE TABLET BY MOUTH ONCE DAILY.  . baclofen (LIORESAL) 10 MG tablet Take 10 mg by mouth as needed for muscle spasms.  . carvedilol (COREG) 3.125 MG tablet Take 1 tablet (3.125 mg total) by mouth 2 (two) times daily with a meal. (Patient taking differently: Take 6.25 mg by mouth 2 (two) times daily with a meal. Pt takes 6.5 mg twice a day.)  . docusate sodium (COLACE) 100 MG capsule Take 100 mg by mouth every evening.   .  donepezil (ARICEPT) 10 MG tablet TAKE ONE TABLET BY MOUTH ONCE DAILY. (Patient taking differently: Take 10 mg by mouth at bedtime.)  . ergocalciferol (VITAMIN D2) 50000 UNITS capsule Take 50,000 Units by mouth every Tuesday.  . ferrous sulfate 325 (65 FE) MG EC tablet Take 325 mg by mouth daily.   . furosemide (LASIX) 40 MG tablet TAKE ONE TABLET BY MOUTH ONCE DAILY.  Marland Kitchen HYDROcodone-acetaminophen (NORCO) 7.5-325 MG tablet Take 1 tablet by mouth every 6 (six) hours as needed for moderate pain.  . hydrocortisone (ANUSOL-HC) 2.5 % rectal cream Place 1 application rectally 3 (three) times daily as needed for hemorrhoids or anal itching.  . levalbuterol (XOPENEX) 0.63 MG/3ML nebulizer solution USE 1 VIAL VIA NEBULIZER EVERY 8 HOURS AS NEEDED.  Marland Kitchen meclizine (ANTIVERT) 25 MG tablet Take 25 mg by mouth 3 (three) times daily as needed for dizziness.  . Melatonin 3 MG TABS Take 1 tablet by mouth at bedtime.  . methylPREDNISolone (MEDROL) 4 MG tablet Take 4 mg by mouth 2 (two) times daily as needed.  . multivitamin-lutein (OCUVITE-LUTEIN) CAPS capsule Take 1 capsule by mouth daily.  . ondansetron (ZOFRAN) 4 MG tablet Take 4 mg by mouth every 8 (eight) hours as needed for nausea or vomiting.  . polyethylene glycol (MIRALAX / GLYCOLAX) 17 g packet Take 17  g by mouth daily as needed for moderate constipation.  . pregabalin (LYRICA) 75 MG capsule TAKE 1 CAPSULE BY MOUTH EVERY OTHER DAY.  Marland Kitchen spironolactone (ALDACTONE) 25 MG tablet TAKE (1/2) TABLET BY MOUTH DAILY.  . traZODone (DESYREL) 50 MG tablet Take 0.5-1 tablets (25-50 mg total) by mouth at bedtime as needed for sleep.  Marland Kitchen warfarin (COUMADIN) 5 MG tablet TAKE 1/2 TO 1 TABLET ONCE DAILY AS DIRECTED BY COUMADIN CLINIC.  Marland Kitchen zinc sulfate 50 MG CAPS capsule Take 50 mg by mouth 2 (two) times daily.    No current facility-administered medications for this visit. (Other)      REVIEW OF SYSTEMS:    ALLERGIES Allergies  Allergen Reactions  . Codeine Nausea And  Vomiting  . Penicillins Other (See Comments)    Unknown reaction - listed on MAR Has patient had a PCN reaction causing immediate rash, facial/tongue/throat swelling, SOB or lightheadedness with hypotension: Unknown Has patient had a PCN reaction causing severe rash involving mucus membranes or skin necrosis: Unknown Has patient had a PCN reaction that required hospitalization: Unknown Has patient had a PCN reaction occurring within the last 10 years: Unknown If all of the above answers are "NO", then may proceed with Cephalosporin use.    PAST MEDICAL HISTORY Past Medical History:  Diagnosis Date  . Anemia   . Anxiety   . Arthritis   . Blood transfusion without reported diagnosis   . Carotid artery stenosis    Without infarction  . Cataract   . Chronic combined systolic (congestive) and diastolic (congestive) heart failure (HCC)   . COPD (chronic obstructive pulmonary disease) (HCC)    Wears 02 at home 2 L  . Coronary artery disease   . Heart valve replaced by other means   . Hypercholesterolemia    Pure  . Hypertension    Unspecified  . LBBB (left bundle branch block)   . Macular degeneration (senile) of retina, unspecified   . On home O2   . Postsurgical aortocoronary bypass status   . Stroke (HCC)   . Transient global amnesia   . Unspecified hereditary and idiopathic peripheral neuropathy   . Unspecified vitamin D deficiency    Past Surgical History:  Procedure Laterality Date  . AORTIC VALVE REPLACEMENT    . APPENDECTOMY    . CORONARY ARTERY BYPASS GRAFT    . ERCP N/A 09/13/2020   Procedure: ENDOSCOPIC RETROGRADE CHOLANGIOPANCREATOGRAPHY (ERCP);  Surgeon: Rachael Fee, MD;  Location: [facility] ENDOSCOPY;  Service: Endoscopy;  Laterality: N/A;  . HIP PINNING,CANNULATED Right 07/18/2018   Procedure: RIGHT CANNULATED HIP PINNING;  Surgeon: Myrene Galas, MD;  Location: [facility] OR;  Service: Orthopedics;  Laterality: Right;  . REMOVAL OF STONES  09/13/2020   Procedure: REMOVAL  OF STONES;  Surgeon: Rachael Fee, MD;  Location: [facility] ENDOSCOPY;  Service: Endoscopy;;  . Dennison Mascot  09/13/2020   Procedure: Dennison Mascot;  Surgeon: Rachael Fee, MD;  Location: [facility] ENDOSCOPY;  Service: Endoscopy;;  . TONSILLECTOMY      FAMILY HISTORY Family History  Problem Relation Age of Onset  . Heart attack Father   . Heart disease Father   . Stroke Father   . Stroke Sister   . Hypertension Mother   . Stroke Mother   . Neuropathy Brother   . COPD Sister     SOCIAL HISTORY Social History   Tobacco Use  . Smoking status: Former Smoker    Packs/day: 0.50    Types: Cigarettes    Quit  date: 11/30/1991    Years since quitting: 29.3  . Smokeless tobacco: Never Used  . Tobacco comment: Tobacco use-no  Substance Use Topics  . Alcohol use: No  . Drug use: No         OPHTHALMIC EXAM: Base Eye Exam    Visual Acuity (ETDRS)      Right Left   Dist cc 20/70 +1 20/80 -2   Dist ph cc NI NI   Correction: Glasses       Tonometry (Tonopen, 10:48 AM)      Right Left   Pressure 10 10       Pupils      Pupils Dark Light Shape React APD   Right PERRL 1 1 Round Minimal None   Left PERRL 1 1 Round Minimal None       Visual Fields (Counting fingers)      Left Right    Full Full       Extraocular Movement      Right Left    Full Full       Neuro/Psych    Oriented x3: Yes   Mood/Affect: Normal       Dilation    Left eye: 1.0% Mydriacyl, 2.5% Phenylephrine @ 10:48 AM        Slit Lamp and Fundus Exam    External Exam      Right Left   External Normal Normal       Slit Lamp Exam      Right Left   Lids/Lashes Normal Normal   Conjunctiva/Sclera White and quiet White and quiet   Cornea Clear Clear   Anterior Chamber Deep and quiet Deep and quiet   Iris Round and reactive Round and reactive   Lens Clear Centered posterior chamber intraocular lens   Anterior Vitreous Normal Normal       Fundus Exam      Right Left   Posterior Vitreous   Posterior vitreous detachment, Central vitreous floaters   Disc  Normal, collateral vessels on the nerve   C/D Ratio  0.35   Macula  No Subretinal hemorrhage, Macular thickening   Vessels  Normal   Periphery  Normal          IMAGING AND PROCEDURES  Imaging and Procedures for 03/24/21  OCT, Retina - OU - Both Eyes       Right Eye Quality was good. Scan locations included subfoveal. Central Foveal Thickness: 279. Progression has been stable. Findings include abnormal foveal contour.   Left Eye Quality was good. Scan locations included subfoveal. Central Foveal Thickness: 281. Progression has improved.   Notes OD, stable overall  OS with much improved perifoveal wet AMD post Eylea, 6-week follow-up, much improved since Feb, 2022       Intravitreal Injection, Pharmacologic Agent - OS - Left Eye       Time Out 03/24/2021. 11:40 AM. Confirmed correct patient, procedure, site, and patient consented.   Anesthesia Topical anesthesia was used. Anesthetic medications included Akten 3.5%.   Procedure Preparation included 5% betadine to ocular surface, 10% betadine to eyelids, Tobramycin 0.3%. A 30 gauge needle was used.   Injection:  2 mg aflibercept Gretta Cool) SOLN   NDC: [id number], Lot: [id number]   Route: Intravitreal, Site: Left Eye, Waste: 0 mg  Post-op Post injection exam found visual acuity of at least counting fingers. The patient tolerated the procedure well. There were no complications. The patient received written and verbal post procedure care education. Post injection medications  were not given.                 ASSESSMENT/PLAN:  Exudative age-related macular degeneration of right eye with inactive choroidal neovascularization (HCC) No signs of recurrence OD  Advanced nonexudative age-related macular degeneration of right eye without subfoveal involvement Atrophy encroaching near the FAZ OD  Exudative age-related macular degeneration of left eye with  active choroidal neovascularization (HCC) Much improved macular anatomy on intravitreal Eylea OS, currently at 6-week follow-up.  We will repeat injection today and next in 7 weeks      ICD-10-CM   1. Exudative age-related macular degeneration of left eye with active choroidal neovascularization (HCC)  H35.3221 OCT, Retina - OU - Both Eyes    Intravitreal Injection, Pharmacologic Agent - OS - Left Eye    aflibercept (EYLEA) SOLN 2 mg  2. 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 Advanced nonexudative age-related macular degeneration of right eye without subfoveal involvement  H35.3113     1.  OS vastly improved, will repeat injection intravitreal Eylea today and return visit as scheduled in 6 weeks  2.  3.  Ophthalmic Meds Ordered this visit:  Meds ordered this encounter  Medications  . aflibercept (EYLEA) SOLN 2 mg       Return in about 6 weeks (around 05/05/2021) for dilate, OS, EYLEA OCT.  There are no Patient Instructions on file for this visit.   Explained the diagnoses, plan, and follow up with the patient and they expressed understanding.  Patient expressed understanding of the importance of proper follow up care.   Alford Highland Kolbey Teichert M.D. Diseases & Surgery of the Retina and Vitreous [facility] 03/24/21     Abbreviations: M myopia (nearsighted); A astigmatism; H hyperopia (farsighted); P presbyopia; Mrx spectacle prescription;  CTL contact lenses; OD right eye; OS left eye; OU both eyes  XT exotropia; ET esotropia; PEK punctate epithelial keratitis; PEE punctate epithelial erosions; DES dry eye syndrome; MGD meibomian gland dysfunction; ATs artificial tears; PFAT's preservative free artificial tears; NSC nuclear sclerotic cataract; PSC posterior subcapsular cataract; ERM epi-retinal membrane; PVD posterior vitreous detachment; RD retinal detachment; DM diabetes mellitus; DR  diabetic retinopathy; NPDR non-proliferative diabetic retinopathy; PDR proliferative diabetic retinopathy; CSME clinically significant macular edema; DME diabetic macular edema; dbh dot blot hemorrhages; CWS cotton wool spot; POAG primary open angle glaucoma; C/D cup-to-disc ratio; HVF humphrey visual field; GVF goldmann visual field; OCT optical coherence tomography; IOP intraocular pressure; BRVO Branch retinal vein occlusion; CRVO central retinal vein occlusion; CRAO central retinal artery occlusion; BRAO branch retinal artery occlusion; RT retinal tear; SB scleral buckle; PPV pars plana vitrectomy; VH Vitreous hemorrhage; PRP panretinal laser photocoagulation; IVK intravitreal kenalog; VMT vitreomacular traction; MH Macular hole;  NVD neovascularization of the disc; NVE neovascularization elsewhere; AREDS age related eye disease study; ARMD age related macular degeneration; POAG primary open angle glaucoma; EBMD epithelial/anterior basement membrane dystrophy; ACIOL anterior chamber intraocular lens; IOL intraocular lens; PCIOL posterior chamber intraocular lens; Phaco/IOL phacoemulsification with intraocular lens placement; PRK photorefractive keratectomy; LASIK laser assisted in situ keratomileusis; HTN hypertension; DM diabetes mellitus; COPD chronic obstructive pulmonary disease   03/24/2021     CHIEF COMPLAINT Patient presents for Retina Follow Up (6 Week F/U OS, poss Eylea OS//Pt sts near [place] OU is worsening, and she is having trouble reading fine print. No new symptoms reported.)   HISTORY OF PRESENT ILLNESS: [name] is a 87 y.o. female  who presents to the clinic today for:   HPI    Retina Follow Up    Diagnosis: Wet AMD   Laterality: left eye   Onset: 6 weeks ago   Severity: moderate   Duration: 6 weeks   Course: gradually worsening   Comments: 6 Week F/U OS, poss Eylea OS  Pt sts near [place] OU is worsening, and she is having trouble reading fine print. No new symptoms  reported.       Last edited by Ileana Roup, COA on 03/24/2021 10:43 AM. (History)      Referring physician: Oval Linsey, MD [address] [place],  [place] [address]  HISTORICAL INFORMATION:   Selected notes from the MEDICAL RECORD NUMBER    Lab Results  Component Value Date   HGBA1C 5.7 (H) 10/27/2019     CURRENT MEDICATIONS: No current outpatient medications on file. (Ophthalmic Drugs)   No current facility-administered medications for this visit. (Ophthalmic Drugs)   Current Outpatient Medications (Other)  Medication Sig  . acetaminophen (TYLENOL) 650 MG CR tablet Take 650 mg by mouth every 8 (eight) hours as needed for pain.  Marland Kitchen ALPRAZolam (XANAX) 0.25 MG tablet Take 0.125-0.25 mg by mouth at bedtime as needed for anxiety.   Ailene Ards ELLIPTA 62.5-25 MCG/INH AEPB USE 1 INHALATION ONCE DAILY.  Marland Kitchen atorvastatin (LIPITOR) 40 MG tablet TAKE ONE TABLET BY MOUTH ONCE DAILY.  . baclofen (LIORESAL) 10 MG tablet Take 10 mg by mouth as needed for muscle spasms.  . carvedilol (COREG) 3.125 MG tablet Take 1 tablet (3.125 mg total) by mouth 2 (two) times daily with a meal. (Patient taking differently: Take 6.25 mg by mouth 2 (two) times daily with a meal. Pt takes 6.5 mg twice a day.)  . docusate sodium (COLACE) 100 MG capsule Take 100 mg by mouth every evening.   . donepezil (ARICEPT) 10 MG tablet TAKE ONE TABLET BY MOUTH ONCE DAILY. (Patient taking differently: Take 10 mg by mouth at bedtime.)  . ergocalciferol (VITAMIN D2) 50000 UNITS capsule Take 50,000 Units by mouth every Tuesday.  . ferrous sulfate 325 (65 FE) MG EC tablet Take 325 mg by mouth daily.   . furosemide (LASIX) 40 MG tablet TAKE ONE TABLET BY MOUTH ONCE DAILY.  Marland Kitchen HYDROcodone-acetaminophen (NORCO) 7.5-325 MG tablet Take 1 tablet by mouth every 6 (six) hours as needed for moderate pain.  . hydrocortisone (ANUSOL-HC) 2.5 % rectal cream Place 1 application rectally 3 (three) times daily as needed for hemorrhoids or anal  itching.  . levalbuterol (XOPENEX) 0.63 MG/3ML nebulizer solution USE 1 VIAL VIA NEBULIZER EVERY 8 HOURS AS NEEDED.  Marland Kitchen meclizine (ANTIVERT) 25 MG tablet Take 25 mg by mouth 3 (three) times daily as needed for dizziness.  . Melatonin 3 MG TABS Take 1 tablet by mouth at bedtime.  . methylPREDNISolone (MEDROL) 4 MG tablet Take 4 mg by mouth 2 (two) times daily as needed.  . multivitamin-lutein (OCUVITE-LUTEIN) CAPS capsule Take 1 capsule by mouth daily.  . ondansetron (ZOFRAN) 4 MG tablet Take 4 mg by mouth every 8 (eight) hours as needed for nausea or vomiting.  . polyethylene glycol (MIRALAX / GLYCOLAX) 17 g packet Take 17 g by mouth daily as needed for moderate constipation.  . pregabalin (LYRICA) 75 MG capsule TAKE 1 CAPSULE BY MOUTH EVERY OTHER DAY.  Marland Kitchen spironolactone (ALDACTONE) 25 MG tablet TAKE (1/2) TABLET BY MOUTH DAILY.  . traZODone (DESYREL) 50 MG tablet Take 0.5-1 tablets (25-50 mg total) by mouth at  bedtime as needed for sleep.  Marland Kitchen. warfarin (COUMADIN) 5 MG tablet TAKE 1/2 TO 1 TABLET ONCE DAILY AS DIRECTED BY COUMADIN CLINIC.  Marland Kitchen. zinc sulfate 50 MG CAPS capsule Take 50 mg by mouth 2 (two) times daily.    No current facility-administered medications for this visit. (Other)      REVIEW OF SYSTEMS:    ALLERGIES Allergies  Allergen Reactions  . Codeine Nausea And Vomiting  . Penicillins Other (See Comments)    Unknown reaction - listed on MAR Has patient had a PCN reaction causing immediate rash, facial/tongue/throat swelling, SOB or lightheadedness with hypotension: Unknown Has patient had a PCN reaction causing severe rash involving mucus membranes or skin necrosis: Unknown Has patient had a PCN reaction that required hospitalization: Unknown Has patient had a PCN reaction occurring within the last 10 years: Unknown If all of the above answers are "NO", then may proceed with Cephalosporin use.    PAST MEDICAL HISTORY Past Medical History:  Diagnosis Date  . Anemia   .  Anxiety   . Arthritis   . Blood transfusion without reported diagnosis   . Carotid artery stenosis    Without infarction  . Cataract   . Chronic combined systolic (congestive) and diastolic (congestive) heart failure (HCC)   . COPD (chronic obstructive pulmonary disease) (HCC)    Wears 02 at home 2 L  . Coronary artery disease   . Heart valve replaced by other means   . Hypercholesterolemia    Pure  . Hypertension    Unspecified  . LBBB (left bundle branch block)   . Macular degeneration (senile) of retina, unspecified   . On home O2   . Postsurgical aortocoronary bypass status   . Stroke (HCC)   . Transient global amnesia   . Unspecified hereditary and idiopathic peripheral neuropathy   . Unspecified vitamin D deficiency    Past Surgical History:  Procedure Laterality Date  . AORTIC VALVE REPLACEMENT    . APPENDECTOMY    . CORONARY ARTERY BYPASS GRAFT    . ERCP N/A 09/13/2020   Procedure: ENDOSCOPIC RETROGRADE CHOLANGIOPANCREATOGRAPHY (ERCP);  Surgeon: Rachael FeeJacobs, Daniel P, MD;  [facility];  Service: Endoscopy;  Laterality: N/A;  . HIP PINNING,CANNULATED Right 07/18/2018   Procedure: RIGHT CANNULATED HIP PINNING;  Surgeon: Myrene GalasHandy, Michael, MD;  Location: [facility] OR;  Service: Orthopedics;  Laterality: Right;  . REMOVAL OF STONES  09/13/2020   Procedure: REMOVAL OF STONES;  Surgeon: Rachael FeeJacobs, Daniel P, MD;  [facility];  Service: Endoscopy;;  . Dennison MascotSPHINCTEROTOMY  09/13/2020   Procedure: Dennison MascotSPHINCTEROTOMY;  Surgeon: Rachael FeeJacobs, Daniel P, MD;  [facility];  Service: Endoscopy;;  . TONSILLECTOMY      FAMILY HISTORY Family History  Problem Relation Age of Onset  . Heart attack Father   . Heart disease Father   . Stroke Father   . Stroke Sister   . Hypertension Mother   . Stroke Mother   . Neuropathy Brother   . COPD Sister     SOCIAL HISTORY Social History   Tobacco Use  . Smoking status: Former Smoker    Packs/day: 0.50    Types: Cigarettes    Quit  date: 11/30/1991    Years since quitting: 29.3  . Smokeless tobacco: Never Used  . Tobacco comment: Tobacco use-no  Substance Use Topics  . Alcohol use: No  . Drug use: No         OPHTHALMIC EXAM: Base Eye Exam    Visual Acuity (  ETDRS)      Right Left   Dist cc 20/70 +1 20/80 -2   Dist ph cc NI NI   Correction: Glasses       Tonometry (Tonopen, 10:48 AM)      Right Left   Pressure 10 10       Pupils      Pupils Dark Light Shape React APD   Right PERRL 1 1 Round Minimal None   Left PERRL 1 1 Round Minimal None       Visual Fields (Counting fingers)      Left Right    Full Full       Extraocular Movement      Right Left    Full Full       Neuro/Psych    Oriented x3: Yes   Mood/Affect: Normal       Dilation    Left eye: 1.0% Mydriacyl, 2.5% Phenylephrine @ 10:48 AM        Slit Lamp and Fundus Exam    External Exam      Right Left   External Normal Normal       Slit Lamp Exam      Right Left   Lids/Lashes Normal Normal   Conjunctiva/Sclera White and quiet White and quiet   Cornea Clear Clear   Anterior Chamber Deep and quiet Deep and quiet   Iris Round and reactive Round and reactive   Lens Clear Centered posterior chamber intraocular lens   Anterior Vitreous Normal Normal       Fundus Exam      Right Left   Posterior Vitreous  Posterior vitreous detachment, Central vitreous floaters   Disc  Normal, collateral vessels on the nerve   C/D Ratio  0.35   Macula  No Subretinal hemorrhage, Macular thickening   Vessels  Normal   Periphery  Normal          IMAGING AND PROCEDURES  Imaging and Procedures for 03/24/21  OCT, Retina - OU - Both Eyes       Right Eye Quality was good. Scan locations included subfoveal. Central Foveal Thickness: 279. Progression has been stable. Findings include abnormal foveal contour.   Left Eye Quality was good. Scan locations included subfoveal. Central Foveal Thickness: 281. Progression has improved.    Notes OD, stable overall  OS with much improved perifoveal wet AMD post Eylea, 6-week follow-up, much improved since Feb, 2022                ASSESSMENT/PLAN:  Exudative age-related macular degeneration of right eye with inactive choroidal neovascularization (HCC) No signs of recurrence OD  Advanced nonexudative age-related macular degeneration of right eye without subfoveal involvement Atrophy encroaching near the FAZ OD  Exudative age-related macular degeneration of left eye with active choroidal neovascularization (HCC) Much improved macular anatomy on intravitreal Eylea OS, currently at 6-week follow-up.  We will repeat injection today and next in 7 weeks      ICD-10-CM   1. Exudative age-related macular degeneration of left eye with active choroidal neovascularization (HCC)  H35.3221 OCT, Retina - OU - Both Eyes  2. 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 Advanced nonexudative age-related macular degeneration of right eye without subfoveal involvement  H35.3113     1.  Wet ARMD OS, now improved on intravitreal Eylea.  (Resistant to Avastin use) currently at 6-week follow-up interval repeat injection today and repeat in 6 weeks  due to patient's schedule but will intend on increasing the interval to 7 weeks in the near future  2.  3.  Ophthalmic Meds Ordered this visit:  No orders of the defined types were placed in this encounter.      Return in about 7 weeks (around 05/12/2021) for dilate, OS, EYLEA OCT.  There are no Patient Instructions on file for this visit.   Explained the diagnoses, plan, and follow up with the patient and they expressed understanding.  Patient expressed understanding of the importance of proper follow up care.   Alford Highland Cameka Rae M.D. Diseases & Surgery of the Retina and Vitreous [facility] 03/24/21     Abbreviations: M myopia  (nearsighted); A astigmatism; H hyperopia (farsighted); P presbyopia; Mrx spectacle prescription;  CTL contact lenses; OD right eye; OS left eye; OU both eyes  XT exotropia; ET esotropia; PEK punctate epithelial keratitis; PEE punctate epithelial erosions; DES dry eye syndrome; MGD meibomian gland dysfunction; ATs artificial tears; PFAT's preservative free artificial tears; NSC nuclear sclerotic cataract; PSC posterior subcapsular cataract; ERM epi-retinal membrane; PVD posterior vitreous detachment; RD retinal detachment; DM diabetes mellitus; DR diabetic retinopathy; NPDR non-proliferative diabetic retinopathy; PDR proliferative diabetic retinopathy; CSME clinically significant macular edema; DME diabetic macular edema; dbh dot blot hemorrhages; CWS cotton wool spot; POAG primary open angle glaucoma; C/D cup-to-disc ratio; HVF humphrey visual field; GVF goldmann visual field; OCT optical coherence tomography; IOP intraocular pressure; BRVO Branch retinal vein occlusion; CRVO central retinal vein occlusion; CRAO central retinal artery occlusion; BRAO branch retinal artery occlusion; RT retinal tear; SB scleral buckle; PPV pars plana vitrectomy; VH Vitreous hemorrhage; PRP panretinal laser photocoagulation; IVK intravitreal kenalog; VMT vitreomacular traction; MH Macular hole;  NVD neovascularization of the disc; NVE neovascularization elsewhere; AREDS age related eye disease study; ARMD age related macular degeneration; POAG primary open angle glaucoma; EBMD epithelial/anterior basement membrane dystrophy; ACIOL anterior chamber intraocular lens; IOL intraocular lens; PCIOL posterior chamber intraocular lens; Phaco/IOL phacoemulsification with intraocular lens placement; PRK photorefractive keratectomy; LASIK laser assisted in situ keratomileusis; HTN hypertension; DM diabetes mellitus; COPD chronic obstructive pulmonary disease

## 2021-03-24 NOTE — Assessment & Plan Note (Signed)
Much improved macular anatomy on intravitreal Eylea OS, currently at 6-week follow-up.  We will repeat injection today and next in 7 weeks

## 2021-03-24 NOTE — Assessment & Plan Note (Signed)
Atrophy encroaching near the FAZ OD

## 2021-03-26 ENCOUNTER — Ambulatory Visit (INDEPENDENT_AMBULATORY_CARE_PROVIDER_SITE_OTHER): Payer: Medicare Other | Admitting: Cardiology

## 2021-03-26 DIAGNOSIS — I6350 Cerebral infarction due to unspecified occlusion or stenosis of unspecified cerebral artery: Secondary | ICD-10-CM

## 2021-03-26 DIAGNOSIS — I359 Nonrheumatic aortic valve disorder, unspecified: Secondary | ICD-10-CM | POA: Diagnosis not present

## 2021-03-26 DIAGNOSIS — Z5181 Encounter for therapeutic drug level monitoring: Secondary | ICD-10-CM

## 2021-03-26 DIAGNOSIS — Z952 Presence of prosthetic heart valve: Secondary | ICD-10-CM | POA: Diagnosis not present

## 2021-03-26 LAB — POCT INR: INR: 2.4 (ref 2.0–3.0)

## 2021-03-28 ENCOUNTER — Other Ambulatory Visit: Payer: Self-pay | Admitting: Cardiology

## 2021-03-30 ENCOUNTER — Encounter (INDEPENDENT_AMBULATORY_CARE_PROVIDER_SITE_OTHER): Payer: Medicare Other | Admitting: Ophthalmology

## 2021-03-31 ENCOUNTER — Other Ambulatory Visit: Payer: Medicare Other

## 2021-03-31 ENCOUNTER — Telehealth: Payer: Self-pay | Admitting: Cardiology

## 2021-03-31 ENCOUNTER — Other Ambulatory Visit: Payer: Self-pay

## 2021-03-31 VITALS — BP 112/48 | HR 56 | Temp 97.8°F | Resp 22 | Wt 111.0 lb

## 2021-03-31 DIAGNOSIS — Z515 Encounter for palliative care: Secondary | ICD-10-CM

## 2021-03-31 NOTE — Telephone Encounter (Signed)
Spoke to [employer] -  She was calling to  Get parameters for  Extra dose lasix. Patient is to receive 40 mg lasix daily   per [name] she states the patient caregiver  Has given patient today extra dose of 20 mg lasix . Weight was  110  ysterday and today 111 lbs.  Sunday  Weight was 112 lbs - patient received an extra 40 mg  - symptomatic dizziness and weakness. Per [name] patient had a GI bug on Saturday .    [facility] protocol for weight 3 lbs or more in 24 hour period may take an extra dose ,if more than 5 lbs in a week contact office.   [name] states she will call the daughter of the patient and caregiver and informed them of protocol

## 2021-03-31 NOTE — Progress Notes (Signed)
PATIENT NAME: [name] DOB: 06/09/1926 MRN: [id number]  PRIMARY CARE PROVIDER: Lucia Gaskins, MD  RESPONSIBLE PARTY:  Acct ID - Guarantor Home Phone Work Phone Relationship Acct Type  [id number] [name]  Self P/F     [name], [place], [place] [address]    PLAN OF CARE and INTERVENTIONS:               1.  GOALS OF CARE/ ADVANCE CARE PLANNING:  Remain home and independent under the care of her daughter and private caregiver.               2.  PATIENT/CAREGIVER EDUCATION:  S/S of UTI and Dehydration               4. PERSONAL EMERGENCY PLAN:  Activate 911 for emergencies.               5.  DISEASE STATUS:  Joint visit with [name], SW, [name]-caregiver and patient.  Patient had a stomach virus last Saturday only lasting a day per caregiver.  Patient continues to be continent of bowel and bladder.  She wears pads for occasional urinary dribbling.  Discussed s/s of UTI and patient denies any symptoms at present.   Patient has been taking an extra dose of 20 mg of lasix with 1 lb weight gain.  She has taken an extra dose today and 2-3 extra doses over the last week.  Caregiver is monitoring weights daily and noted an extra 40 mg of lasix was given on Sunday as patient was 112 lbs and appeared to be holding fluid in her abdomen.  Patient notes dizziness at times and weakness to her knees.  Caregiver noted patient is having more bad days where she is feeling weak.  We discussed concern that patient maybe getting to much lasix and could easily become dehydrated.  Advised that I would follow up with Cardiology to clarify extra lasix orders.  Patient and caregiver report history of neck pain.  New pain to shoulders and left arm started about a week ago.  Caregiver states patient reported "shooting pain down her left arm that took her breath" but symptoms subsided.  Patient states pain is now more achy across her shoulder/left upper arm that is intermittent. She is not  taking anything to manage the pain.  She feels this is old age and does not feel it is necessary to take anything.  Patient currently has tylenol she can take for mild pain and I have encouraged her to utilize this as needed.  We discussed signs of cardiac issues vs arthritic/nerve pain.  Patient is encouraged to contact her PCP should this persist and caregiver is aware of activating 911 should an emergency arise.  1145 am Phone call made to cardiology requesting a call back from the nurse related to lasix dosage.  1253 pm.  Larene Beach, RN returned call and I have advised of my visit with patient today and concern over the amount of lasix patient is taking.  Larene Beach, RN states patient should be taking an extra 20 mg of lasix for a 3 lb weight gain in 24 hours.  Should patient gain 5 or more lbs in a week then the office should be notified.  Orders repeated and confirmed by Larene Beach, RN.    117 pm.  Phone call made to patient to advise of new orders for lasix.  Patient voiced understanding but requested that I also speak with [name]-private caregiver.  New orders relayed  to [place].  She voices understanding and no other concerns are voiced.    HISTORY OF PRESENT ILLNESS:  87 year old female with a history of CHF.  Patient is being followed by Palliative Care monthly and PRN. CODE STATUS: DNR ADVANCED DIRECTIVES: No MOST FORM: No PPS: 50%   PHYSICAL EXAM:   VITALS: Today's Vitals   03/31/21 0923  BP: (!) 112/48  Pulse: (!) 56  Resp: (!) 22  Temp: 97.8 F (36.6 C)  SpO2: 92%  Weight: 111 lb (50.3 kg)  PainSc: 0-No pain    LUNGS: CTA CARDIAC: HRR EXTREMITIES: - edema SKIN: warm and dry to touch.  No skin breakdown reported. NEURO: alert and oriented x 3 with occasional forgetfulness.       Lorenza Burton, RN

## 2021-03-31 NOTE — Telephone Encounter (Signed)
New Message    Please call, she have some questions about pt's Lasix.

## 2021-03-31 NOTE — Progress Notes (Signed)
COMMUNITY PALLIATIVE CARE SW NOTE  PATIENT NAME: [name] DOB: 10/06/1926 MRN: [id number]  PRIMARY CARE PROVIDER: Lucia Gaskins, MD  RESPONSIBLE PARTY:  Acct ID - Guarantor Home Phone Work Phone Relationship Acct Type  [id number] [name]  Self P/F     [name], [place], [place] [address]     PLAN OF CARE and INTERVENTIONS:             GOALS OF CARE/ ADVANCE CARE PLANNING: SW and RN met with patient and patient's live-in caregiver, [name], in patient's home for routine monthly visit. Patient lives in a one story home. Patient has chronic COPD and respiratory failure.    Patient in spirits today. Patient and caregiver updated SW medical condition and changes.  Patient is stable from previous month. Patient recently saw ophthalmology and received shot in L eye, no new concerns. However, patient shares that she had a good day yesterday, but today she felt a bit weaker Patient shared that she did sleep well the night before, which may have contributed to her change today. Caregiver has noticed some memory deficits lately, but progressive.    Patient continues on 4LPM as needed. Caregiver gives patient nebulizer treatments at HS and PRN. Patient shared that she and he caregiver continue to go out daily for lunch and that she goes to local gym M,W,F. No falls reported. Patient has difficulty sleeping some nights. Patient has complained of pain in neck and that tends to radiate down back and L arm, patient states this is not everyday. RN advised to keep an eye on pain and effects it may have on mobility and nausea.    RN checked vitals. No recent medication changes. Patient's caregiver checks Coumadin at home. Caregiver also takes patients vitals and weights daily, weights have been stable, current weight is 110lbs. Patient takes Lasix Q morning.   SW discussed goals, reviewed care plan, provided emotional support, used active and reflective listening. Palliative care  will continue to monitor and assist with long term care planning as needed.    3.         PATIENT/CAREGIVER EDUCATION/ COPING:  Patient continues with active lifestyle of going to the gym and riding the stationary bike, going out to lunch daily and going on leisure outings. Patient A&O x3, Pt is HOH but able to answer questions appropriately. Patient denies any anxiety or depression, patient takes Xanax HS. Patients family is supportive   4.         PERSONAL EMERGENCY PLAN:  Patient will call 9-1-1 for emergencies.    5.         COMMUNITY RESOURCES COORDINATION/ HEALTH CARE NAVIGATION:  Patient and patients daughter manages her care.   6.         FINANCIAL/LEGAL CONCERNS/INTERVENTIONS:  None.          SOCIAL HX:  Social History   Tobacco Use  . Smoking status: Former Smoker    Packs/day: 0.50    Types: Cigarettes    Quit date: 11/30/1991    Years since quitting: 29.3  . Smokeless tobacco: Never Used  . Tobacco comment: Tobacco use-no  Substance Use Topics  . Alcohol use: No    CODE STATUS: DNR  ADVANCED DIRECTIVES: Y MOST FORM COMPLETE: Y HOSPICE EDUCATION PROVIDED: N  PPS: patient is independent with all ADL's.    Time spent: 1 hr      [name], 

## 2021-04-09 ENCOUNTER — Ambulatory Visit (INDEPENDENT_AMBULATORY_CARE_PROVIDER_SITE_OTHER): Payer: Medicare Other | Admitting: Cardiology

## 2021-04-09 DIAGNOSIS — I359 Nonrheumatic aortic valve disorder, unspecified: Secondary | ICD-10-CM

## 2021-04-09 DIAGNOSIS — Z952 Presence of prosthetic heart valve: Secondary | ICD-10-CM

## 2021-04-09 DIAGNOSIS — Z5181 Encounter for therapeutic drug level monitoring: Secondary | ICD-10-CM

## 2021-04-09 DIAGNOSIS — I6350 Cerebral infarction due to unspecified occlusion or stenosis of unspecified cerebral artery: Secondary | ICD-10-CM

## 2021-04-09 LAB — POCT INR: INR: 2.3 (ref 2.0–3.0)

## 2021-04-22 ENCOUNTER — Other Ambulatory Visit: Payer: Self-pay | Admitting: Cardiology

## 2021-04-23 ENCOUNTER — Ambulatory Visit (INDEPENDENT_AMBULATORY_CARE_PROVIDER_SITE_OTHER): Payer: Medicare Other | Admitting: Pharmacist Clinician (PhC)/ Clinical Pharmacy Specialist

## 2021-04-23 DIAGNOSIS — I359 Nonrheumatic aortic valve disorder, unspecified: Secondary | ICD-10-CM | POA: Diagnosis not present

## 2021-04-23 DIAGNOSIS — Z952 Presence of prosthetic heart valve: Secondary | ICD-10-CM | POA: Diagnosis not present

## 2021-04-23 DIAGNOSIS — Z7901 Long term (current) use of anticoagulants: Secondary | ICD-10-CM | POA: Diagnosis not present

## 2021-04-23 DIAGNOSIS — Z5181 Encounter for therapeutic drug level monitoring: Secondary | ICD-10-CM

## 2021-04-23 DIAGNOSIS — I6350 Cerebral infarction due to unspecified occlusion or stenosis of unspecified cerebral artery: Secondary | ICD-10-CM

## 2021-04-23 LAB — POCT INR: INR: 3.6 — AB (ref 2.0–3.0)

## 2021-04-28 ENCOUNTER — Other Ambulatory Visit: Payer: Self-pay

## 2021-04-28 ENCOUNTER — Other Ambulatory Visit: Payer: Medicare Other

## 2021-04-28 VITALS — BP 106/42 | HR 68 | Temp 97.9°F | Wt 110.0 lb

## 2021-04-28 DIAGNOSIS — Z515 Encounter for palliative care: Secondary | ICD-10-CM

## 2021-04-28 NOTE — Progress Notes (Signed)
PATIENT NAME: [name] DOB: [birth year] MRN: [id number]  PRIMARY CARE PROVIDER: Oval Linsey, MD  RESPONSIBLE PARTY:  Acct ID - Guarantor Home Phone Work Phone Relationship Acct Type  [id number] [name]* [phone]  Self P/F     [name], , [place] [address]    PLAN OF CARE and INTERVENTIONS:               1.  GOALS OF CARE/ ADVANCE CARE PLANNING:  Patient desires to remain home under the care of her private sitter and daughter.               2.  PATIENT/CAREGIVER EDUCATION:  Use of Lasix, blood pressure readings.               4. PERSONAL EMERGENCY PLAN:  Activate 911 for emergencies.               5.  DISEASE STATUS:  Joint visit completed with [place], SW, caregiver [name] and patient.  Patient found in her recliner chair reading.  She reports not being out of the home since Saturday.  Patient notes an increase in fatigue/weakness to her legs.  No falls are reported.  Using her cane more often now.   Patient reports a bowel movement this am.  She feels she is not always able to evacuate stool completely.  She denies constipation and diarrhea.  Notes she is taking iron but no side effects are noted.  Patient denies the need for stool softeners at this time.  Bowel sounds present x 4 quads.  No tenderness to abdomen with palpation.   Po intake remains good.  Caregiver notes patient did not eat well yesterday.  Patient was previously going out to eat daily for lunch.  She is eating meals at home now.  Patient continues to take an extra dose of lasix with a 1 lb weight gain in a 24 hour period.  I re-enforced the Heart Care Protocol that was given by Dr. Ludwig Clarks office last month.  Do not take an extra dose of lasix unless there has been a 3 lb weight gain in a 24 hour period.  If more than 5 lb in a week Dr. Ludwig Clarks office should be notified.   Patient's weight is consistently ranging from 110-111 lbs.   She denies worsening shortness of breath.   Reviewed  blood pressure readings that caregiver is obtaining daily.  Systolic ranges from low 100's-130's and diastolic 40's-60's.  Instructed caregiver if blood pressures remain low Dr. Ludwig Clarks office should be notified for possible medication adjustments.   Caregiver notes PT/INR elevated and med adjustments were made. Patient will have a repeat PT/INR on June 23.  Patient is scheduled to see PCP in 2 weeks.  Encouraged patient to follow up with PCP sooner if stomach issues continue to be a problem.   HISTORY OF PRESENT ILLNESS:  87 year old female with a hx of CHF.  Patient is being followed by Palliative Care monthly and PRN.  CODE STATUS: DNR ADVANCED DIRECTIVES: No MOST FORM: No PPS: 50%   PHYSICAL EXAM:   VITALS: Today's Vitals   04/28/21 0905  BP: (!) 106/42  Pulse: 68  Temp: 97.9 F (36.6 C)  SpO2: 96%  Weight: 110 lb (49.9 kg)    LUNGS: clear to auscultation , decreased breath sounds CARDIAC: Cor RRR}  EXTREMITIES: - edema SKIN: Skin color, texture, turgor normal. No rashes or lesions or no edema, no evidence of bleeding or  bruising, and temperature normal  NEURO: positive for memory problems and weakness       Truitt Merle, RN

## 2021-04-28 NOTE — Progress Notes (Signed)
COMMUNITY PALLIATIVE CARE SW NOTE  PATIENT NAME: [name] DOB: 05-21-1926 MRN: [id number]  PRIMARY CARE PROVIDER: Lucia Gaskins, MD  RESPONSIBLE PARTY:  Acct ID - Guarantor Home Phone Work Phone Relationship Acct Type  [id number] - [name],[phone]  Self P/F     [name], [place], [place] [address]     PLAN OF CARE and INTERVENTIONS:             GOALS OF CARE/ ADVANCE CARE PLANNING:  Patient is a DNR. Patients daughter, [name], is POA. Patient's goal is to remain at home as independent as possible.   2.         SOCIAL/EMOTIONAL/SPIRITUAL ASSESSMENT/ INTERVENTIONS:  SW and RN met with patient and patient's live-in caregiver, [name], in patient's home for routine monthly visit. Patient lives in a one story home. Patient has chronic COPD and respiratory failure.   Patient shares that she is feeling fine today. Patient and caregiver updated SW medical condition and changes.  Patient has been weaker over the past week or so to the extent of not going out on her daily outings. Patient shares that her legs just feel weaker and has been having stomach discomfort. Caregiver shares that patients oxygen has been reading lower than normal at rest. Continues on 4LPM and nebulizer treatments as needed. No falls reported. No pain reported, just weakness.   RN checked vitals. No recent medication changes. Patient's caregiver checks Coumadin at home. Most recent reading was 3.6. Caregiver also takes patients vitals and weights daily, weights have been stable, current weight is 110lbs. Patient takes Lasix Qmorning. Patients BP have been running low. Patient scheduled to see PCP next week, advised to see if appt can be moved to sooner.    SW discussed goals, reviewed care plan, provided emotional support, used active and reflective listening. Palliative care will continue to monitor and assist with long term care planning as needed.   3.         PATIENT/CAREGIVER EDUCATION/ COPING:  Patient  continues with active lifestyle of going to the gym and riding the stationary bike, going out to lunch daily and going on leisure outings. Patient A&O x3, Pt is HOH but able to answer questions appropriately. Patient denies any anxiety or depression, patient takes Xanax HS. Patients family is supportive   4.         PERSONAL EMERGENCY PLAN:  Patient will call 9-1-1 for emergencies.   5.         COMMUNITY RESOURCES COORDINATION/ HEALTH CARE NAVIGATION:  Patient and patients daughter manages her care.   6.         FINANCIAL/LEGAL CONCERNS/INTERVENTIONS:  None.      SOCIAL HX:  Social History   Tobacco Use   Smoking status: Former    Packs/day: 0.50    Pack years: 0.00    Types: Cigarettes    Quit date: 11/30/1991    Years since quitting: 29.4   Smokeless tobacco: Never   Tobacco comments:    Tobacco use-no  Substance Use Topics   Alcohol use: No    CODE STATUS: DNR ADVANCED DIRECTIVES: Y MOST FORM COMPLETE:  Y HOSPICE EDUCATION PROVIDED: N  PPS: Patient is Independent -  supervision with ADL's.    Time spent: 40 min.       Doreene Eland, [place]

## 2021-04-30 ENCOUNTER — Telehealth: Payer: Self-pay

## 2021-04-30 ENCOUNTER — Telehealth: Payer: Self-pay | Admitting: Cardiology

## 2021-04-30 NOTE — Telephone Encounter (Signed)
Pt c/o BP issue: STAT if pt c/o blurred vision, one-sided weakness or slurred speech  1. What are your last 5 BP readings? 105/57; 101/47; 104/50; 106/42; 100/49  2. Are you having any other symptoms (ex. Dizziness, headache, blurred vision, passed out)? Nausea feeling weak, no energy  3. What is your BP issue? Its low  Patient's daughter would like for [facility] Kidney

## 2021-04-30 NOTE — Telephone Encounter (Signed)
1112 am.  Incoming call from [name] and message left.  Return call made at 1 pm.  [name] reports patient's blood pressure continued to trend downward after my visit on Tuesday.  Cardiology was notified and carvedilol was discontinued. We also discussed the use of prn lasix.  [name] states they were going by a previous weight of 108 lbs but note patient has been consistent at 110 lbs.  She is aware to given an extra dose of lasix with a 3 lb weight gain in 24 hours or 5 lb weight gain in 1 week.  [name] notes her and caregiver [name] are seeing patient's decline.  We discussed disease progression and goals of care.  [name] confirms she wants comfort for the patient and understands the disease progression.  Daughter is asking how Palliative Care can assist.  We discuss the role of PC and Hospice.  Daughter asked if visits could be increased to 2x monthly and I advised this is possible given patient's decline.  Patient has not been out of the home since Saturday.  She continues to report weakness and nausea.  Appetite is fluctuating but no changes in weight. Daughter is asking if blood work could be obtained as they already have orders but are uncertain that patient would be able to make it to the lab given her increase in weakness.  Advised that PC can obtain blood work on homebound patients only.  Patient may likely be in this category by next week per daughter.  Advised I would follow up on Monday with caregiver to assess how blood pressures have been over the weekend and overall status. Will reassess need for in-home blood work to be obtained.

## 2021-04-30 NOTE — Telephone Encounter (Signed)
Spoke with pt daughter, the palliative care nurse told them to let us know how the patients blood pressure has been running. They have noticed that the patient seems to be getting weaker and weaker. Per dr Jens Som, patient instructed to stop carvedilol. They will continue to monitor her blood pressure.

## 2021-05-01 ENCOUNTER — Telehealth: Payer: Self-pay

## 2021-05-01 NOTE — Telephone Encounter (Signed)
Phone call made to [name]-daughter to advised patient's case was reviewed with Dr. Gordy Savers and she meets the criteria for hospice services.  [name] asked about the process and what this might look like.  Advised that PCP would also need to give an order for hospice referral.  Referral office at [facility] would contact her to schedule an appointment for admission visit.  Explained hospice services and philosophy.  Daughter will be with patient tomorrow and will discuss this further with her.  [name] is asking if hospice could do the blood work.  Advised that I would follow up with hospice MD and get back with her.

## 2021-05-04 ENCOUNTER — Telehealth: Payer: Self-pay

## 2021-05-04 NOTE — Telephone Encounter (Signed)
1142 am Follow up phone call made to patient to reassess blood pressures since Carvedilol was discontinued last week. No answer but message has been left requesting a call back.   1143 am.  Phone call made to [name]-daughter to follow up on patient's condition.  [name] states blood pressure are back up in the 120's now.  Patient had a good day on Saturday and ate well.  [name] spoke with patient about hospice but patient was not receptive.  [name] has asked that I give information to patient on my next visit.  [name] only wants patient to have the information and she can make the decision when needed.  If patient is unable to make a decision due to cognitive issues then [name] states she would do so.  [name] states patient did not feel well on Sunday but did get into the car and road into town for a biscuit.  Patient did not get out of the vehicle due to fatigue.   PCP will be retiring next month and patient is will need a find a new provider.  Dr. Evette Doffing might be accepting new patients per [name].  She will follow up on this request.  Palliative Care will see patient 2 x monthly for now.  Patient to be seen by PCP next week and went to the lab for blood work to be obtained today.  No other concerns noted at this time.

## 2021-05-05 ENCOUNTER — Encounter (INDEPENDENT_AMBULATORY_CARE_PROVIDER_SITE_OTHER): Payer: Medicare Other | Admitting: Ophthalmology

## 2021-05-07 ENCOUNTER — Ambulatory Visit (INDEPENDENT_AMBULATORY_CARE_PROVIDER_SITE_OTHER): Payer: Medicare Other | Admitting: Cardiovascular Disease

## 2021-05-07 DIAGNOSIS — I6350 Cerebral infarction due to unspecified occlusion or stenosis of unspecified cerebral artery: Secondary | ICD-10-CM | POA: Diagnosis not present

## 2021-05-07 DIAGNOSIS — Z5181 Encounter for therapeutic drug level monitoring: Secondary | ICD-10-CM

## 2021-05-07 DIAGNOSIS — Z952 Presence of prosthetic heart valve: Secondary | ICD-10-CM | POA: Diagnosis not present

## 2021-05-07 DIAGNOSIS — I359 Nonrheumatic aortic valve disorder, unspecified: Secondary | ICD-10-CM

## 2021-05-07 LAB — POCT INR: INR: 4.2 — AB (ref 2.0–3.0)

## 2021-05-11 ENCOUNTER — Other Ambulatory Visit: Payer: Self-pay

## 2021-05-11 ENCOUNTER — Ambulatory Visit (INDEPENDENT_AMBULATORY_CARE_PROVIDER_SITE_OTHER): Payer: Medicare Other | Admitting: Ophthalmology

## 2021-05-11 ENCOUNTER — Encounter (INDEPENDENT_AMBULATORY_CARE_PROVIDER_SITE_OTHER): Payer: Self-pay | Admitting: Ophthalmology

## 2021-05-11 DIAGNOSIS — H353221 Exudative age-related macular degeneration, left eye, with active choroidal neovascularization: Secondary | ICD-10-CM

## 2021-05-11 DIAGNOSIS — H353124 Nonexudative age-related macular degeneration, left eye, advanced atrophic with subfoveal involvement: Secondary | ICD-10-CM

## 2021-05-11 MED ORDER — AFLIBERCEPT 2MG/0.05ML IZ SOLN FOR KALEIDOSCOPE
2.0000 mg | INTRAVITREAL | Status: AC | PRN
  Administered 2021-05-11: 2 mg via INTRAVITREAL

## 2021-05-11 NOTE — Progress Notes (Signed)
05/11/2021     CHIEF COMPLAINT Patient presents for Retina Follow Up (6 week fu OS and Eylea OS/Pt states VA OU stable since last visit. Pt denies FOL, floaters, or ocular pain OU. /)   HISTORY OF PRESENT ILLNESS: [name] is a 87 y.o. female who presents to the clinic today for:   HPI     Retina Follow Up           Diagnosis: Wet AMD   Laterality: left eye   Onset: 6 weeks ago   Severity: mild   Duration: 6 weeks   Course: stable   Comments: 6 week fu OS and Eylea OS Pt states VA OU stable since last visit. Pt denies FOL, floaters, or ocular pain OU.         Last edited by Demetrios Loll, COA on 05/11/2021 10:54 AM.      Referring physician: Oval Linsey, MD [address] [place],  [place] [address]  HISTORICAL INFORMATION:   Selected notes from the MEDICAL RECORD NUMBER    Lab Results  Component Value Date   HGBA1C 5.7 (H) 10/27/2019     CURRENT MEDICATIONS: No current outpatient medications on file. (Ophthalmic Drugs)   No current facility-administered medications for this visit. (Ophthalmic Drugs)   Current Outpatient Medications (Other)  Medication Sig   spironolactone (ALDACTONE) 25 MG tablet TAKE (1/2) TABLET BY MOUTH DAILY.   acetaminophen (TYLENOL) 650 MG CR tablet Take 650 mg by mouth every 8 (eight) hours as needed for pain.   ALPRAZolam (XANAX) 0.25 MG tablet Take 0.125-0.25 mg by mouth at bedtime as needed for anxiety.    ANORO ELLIPTA 62.5-25 MCG/INH AEPB USE 1 INHALATION ONCE DAILY.   atorvastatin (LIPITOR) 40 MG tablet TAKE ONE TABLET BY MOUTH ONCE DAILY.   baclofen (LIORESAL) 10 MG tablet Take 10 mg by mouth as needed for muscle spasms.   docusate sodium (COLACE) 100 MG capsule Take 100 mg by mouth every evening.    donepezil (ARICEPT) 10 MG tablet TAKE ONE TABLET BY MOUTH ONCE DAILY. (Patient taking differently: Take 10 mg by mouth at bedtime.)   ergocalciferol (VITAMIN D2) 50000 UNITS capsule Take 50,000 Units by mouth every  Tuesday.   ferrous sulfate 325 (65 FE) MG EC tablet Take 325 mg by mouth daily.    furosemide (LASIX) 40 MG tablet TAKE ONE TABLET BY MOUTH ONCE DAILY.   HYDROcodone-acetaminophen (NORCO) 7.5-325 MG tablet Take 1 tablet by mouth every 6 (six) hours as needed for moderate pain.   hydrocortisone (ANUSOL-HC) 2.5 % rectal cream Place 1 application rectally 3 (three) times daily as needed for hemorrhoids or anal itching.   levalbuterol (XOPENEX) 0.63 MG/3ML nebulizer solution USE 1 VIAL VIA NEBULIZER EVERY 8 HOURS AS NEEDED.   meclizine (ANTIVERT) 25 MG tablet Take 25 mg by mouth 3 (three) times daily as needed for dizziness.   Melatonin 3 MG TABS Take 1 tablet by mouth at bedtime.   methylPREDNISolone (MEDROL) 4 MG tablet Take 4 mg by mouth 2 (two) times daily as needed.   multivitamin-lutein (OCUVITE-LUTEIN) CAPS capsule Take 1 capsule by mouth daily.   ondansetron (ZOFRAN) 4 MG tablet Take 4 mg by mouth every 8 (eight) hours as needed for nausea or vomiting.   polyethylene glycol (MIRALAX / GLYCOLAX) 17 g packet Take 17 g by mouth daily as needed for moderate constipation.   pregabalin (LYRICA) 75 MG capsule TAKE 1 CAPSULE BY MOUTH EVERY OTHER DAY.   traZODone (DESYREL)  50 MG tablet Take 0.5-1 tablets (25-50 mg total) by mouth at bedtime as needed for sleep.   warfarin (COUMADIN) 5 MG tablet TAKE 1/2 TO 1 TABLET ONCE DAILY AS DIRECTED BY COUMADIN CLINIC.   zinc sulfate 50 MG CAPS capsule Take 50 mg by mouth 2 (two) times daily.    No current facility-administered medications for this visit. (Other)      REVIEW OF SYSTEMS:    ALLERGIES Allergies  Allergen Reactions   Codeine Nausea And Vomiting   Penicillins Other (See Comments)    Unknown reaction - listed on MAR Has patient had a PCN reaction causing immediate rash, facial/tongue/throat swelling, SOB or lightheadedness with hypotension: Unknown Has patient had a PCN reaction causing severe rash involving mucus membranes or skin  necrosis: Unknown Has patient had a PCN reaction that required hospitalization: Unknown Has patient had a PCN reaction occurring within the last 10 years: Unknown If all of the above answers are "NO", then may proceed with Cephalosporin use.    PAST MEDICAL HISTORY Past Medical History:  Diagnosis Date   Anemia    Anxiety    Arthritis    Blood transfusion without reported diagnosis    Carotid artery stenosis    Without infarction   Cataract    Chronic combined systolic (congestive) and diastolic (congestive) heart failure (HCC)    COPD (chronic obstructive pulmonary disease) (HCC)    Wears 02 at home 2 L   Coronary artery disease    Heart valve replaced by other means    Hypercholesterolemia    Pure   Hypertension    Unspecified   LBBB (left bundle branch block)    Macular degeneration (senile) of retina, unspecified    On home O2    Postsurgical aortocoronary bypass status    Stroke (HCC)    Transient global amnesia    Unspecified hereditary and idiopathic peripheral neuropathy    Unspecified vitamin D deficiency    Past Surgical History:  Procedure Laterality Date   AORTIC VALVE REPLACEMENT     APPENDECTOMY     CORONARY ARTERY BYPASS GRAFT     ERCP N/A 09/13/2020   Procedure: ENDOSCOPIC RETROGRADE CHOLANGIOPANCREATOGRAPHY (ERCP);  Surgeon: Rachael Fee, MD;  Location: [facility] ENDOSCOPY;  Service: Endoscopy;  Laterality: N/A;   HIP PINNING,CANNULATED Right 07/18/2018   Procedure: RIGHT CANNULATED HIP PINNING;  Surgeon: Myrene Galas, MD;  Location: [facility] OR;  Service: Orthopedics;  Laterality: Right;   REMOVAL OF STONES  09/13/2020   Procedure: REMOVAL OF STONES;  Surgeon: Rachael Fee, MD;  Location: [facility] ENDOSCOPY;  Service: Endoscopy;;   SPHINCTEROTOMY  09/13/2020   Procedure: Dennison Mascot;  Surgeon: Rachael Fee, MD;  Location: [facility] ENDOSCOPY;  Service: Endoscopy;;   TONSILLECTOMY      FAMILY HISTORY Family History  Problem Relation Age of Onset   Heart  attack Father    Heart disease Father    Stroke Father    Stroke Sister    Hypertension Mother    Stroke Mother    Neuropathy Brother    COPD Sister     SOCIAL HISTORY Social History   Tobacco Use   Smoking status: Former    Packs/day: 0.50    Pack years: 0.00    Types: Cigarettes    Quit date: 11/30/1991    Years since quitting: 29.4   Smokeless tobacco: Never   Tobacco comments:    Tobacco use-no  Substance Use Topics   Alcohol use: No   Drug use:  No         OPHTHALMIC EXAM:  Base Eye Exam     Visual Acuity (ETDRS)       Right Left   Dist cc 20/80 20/200   Dist ph cc 20/70 20/100    Correction: Glasses         Tonometry (Tonopen, 11:00 AM)       Right Left   Pressure 10 10         Pupils       Pupils Dark Light Shape React APD   Right PERRL 1 1 Round Minimal None   Left PERRL 1 1 Round Minimal None         Visual Fields (Counting fingers)       Left Right    Full Full         Extraocular Movement       Right Left    Full Full         Neuro/Psych     Oriented x3: Yes   Mood/Affect: Normal         Dilation     Left eye: 1.0% Mydriacyl, 2.5% Phenylephrine @ 11:00 AM           Slit Lamp and Fundus Exam     External Exam       Right Left   External Normal Normal         Slit Lamp Exam       Right Left   Lids/Lashes Normal Normal   Conjunctiva/Sclera White and quiet White and quiet   Cornea Clear Clear   Anterior Chamber Deep and quiet Deep and quiet   Iris Round and reactive,pinhole Round and reactive   Lens Clear Centered posterior chamber intraocular lens   Anterior Vitreous Normal Normal         Fundus Exam       Right Left   Posterior Vitreous  Posterior vitreous detachment, Central vitreous floaters   Disc  Normal, collateral vessels on the nerve   C/D Ratio  0.35   Macula  No Subretinal hemorrhage, no macular thickening, Soft drusen, Geographic atrophy   Vessels  Normal   Periphery   Normal            IMAGING AND PROCEDURES  Imaging and Procedures for 05/11/21  OCT, Retina - OU - Both Eyes       Right Eye Quality was good. Scan locations included subfoveal. Central Foveal Thickness: 279. Progression has been stable. Findings include abnormal foveal contour.   Left Eye Quality was good. Scan locations included subfoveal. Central Foveal Thickness: 274. Progression has been stable. Findings include abnormal foveal contour.   Notes OD, stable overall  OS with much improved perifoveal wet AMD post Eylea, 6-week follow-up, much improved since Feb, 2022     Intravitreal Injection, Pharmacologic Agent - OS - Left Eye       Time Out 05/11/2021. 11:34 AM. Confirmed correct patient, procedure, site, and patient consented.   Anesthesia Topical anesthesia was used. Anesthetic medications included Akten 3.5%.   Procedure Preparation included 5% betadine to ocular surface, 10% betadine to eyelids, Tobramycin 0.3%. A 30 gauge needle was used.   Injection: 2 mg aflibercept 2 MG/0.05ML   Route: Intravitreal, Site: Left Eye   NDC: [id number], Lot: [id number], Waste: 0 mL   Post-op Post injection exam found visual acuity of at least counting fingers. The patient tolerated the procedure well. There were no complications. The patient received written and  verbal post procedure care education. Post injection medications were not given.              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  Will consider increasing Interval examination next to 8 weeks     ICD-10-CM   1. Exudative age-related macular degeneration of left eye with active choroidal neovascularization (HCC)  H35.3221 OCT,  Retina - OU - Both Eyes    Intravitreal Injection, Pharmacologic Agent - OS - Left Eye    aflibercept (EYLEA) SOLN 2 mg    2. 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  Much less active wet AMD, OS at 6-week interval yet still with progressive vision loss on the basis of dry atrophic geographic atrophy centrally OS.  Will repeat injection Eylea OS today and extend interval examination next 8 weeks  2.  OD also with central  Foveal atrophy from dry AMD will continue to observe no signs of CNVM  3.  Ophthalmic Meds Ordered this visit:  Meds ordered this encounter  Medications   aflibercept (EYLEA) SOLN 2 mg       Return in about 8 weeks (around 07/06/2021) for DILATE OU, EYLEA OCT, OS.  There are no Patient Instructions on file for this visit.   Explained the diagnoses, plan, and follow up with the patient and they expressed understanding.  Patient expressed understanding of the importance of proper follow up care.   Alford Highland Yomaira Solar M.D. Diseases & Surgery of the Retina and Vitreous [facility] 05/11/21     Abbreviations: M myopia (nearsighted); A astigmatism; H hyperopia (farsighted); P presbyopia; Mrx spectacle prescription;  CTL contact lenses; OD right eye; OS left eye; OU both eyes  XT exotropia; ET esotropia; PEK punctate epithelial keratitis; PEE punctate epithelial erosions; DES dry eye syndrome; MGD meibomian gland dysfunction; ATs artificial tears; PFAT's preservative free artificial tears; NSC nuclear sclerotic cataract; PSC posterior subcapsular cataract; ERM epi-retinal membrane; PVD posterior vitreous detachment; RD retinal detachment; DM diabetes mellitus; DR diabetic retinopathy; NPDR non-proliferative diabetic retinopathy; PDR proliferative diabetic retinopathy; CSME clinically significant macular edema; DME diabetic macular edema; dbh dot blot hemorrhages; CWS cotton wool  spot; POAG primary open angle glaucoma; C/D cup-to-disc ratio; HVF humphrey visual field; GVF goldmann visual field; OCT optical coherence tomography; IOP intraocular pressure; BRVO Branch retinal vein occlusion; CRVO central retinal vein occlusion; CRAO central retinal artery occlusion; BRAO branch retinal artery occlusion; RT retinal tear; SB scleral buckle; PPV pars plana vitrectomy; VH Vitreous hemorrhage; PRP panretinal laser photocoagulation; IVK intravitreal kenalog; VMT vitreomacular traction; MH Macular hole;  NVD neovascularization of the disc; NVE neovascularization elsewhere; AREDS age related eye disease study; ARMD age related macular degeneration; POAG primary open angle glaucoma; EBMD epithelial/anterior basement membrane dystrophy; ACIOL anterior chamber intraocular lens; IOL intraocular lens; PCIOL posterior chamber intraocular lens; Phaco/IOL phacoemulsification with intraocular lens placement; PRK photorefractive keratectomy; LASIK laser assisted in situ keratomileusis; HTN hypertension; DM diabetes mellitus; COPD chronic obstructive pulmonary disease

## 2021-05-11 NOTE — Assessment & Plan Note (Signed)
Progressive geographic atrophy accounting for acuity

## 2021-05-11 NOTE — Assessment & Plan Note (Signed)
At 6-week interval again no signs of active CNVM.  Will consider increasing Interval examination next to 8 weeks

## 2021-05-12 ENCOUNTER — Encounter (INDEPENDENT_AMBULATORY_CARE_PROVIDER_SITE_OTHER): Payer: Medicare Other | Admitting: Ophthalmology

## 2021-05-14 LAB — POCT INR: INR: 3.2 — AB (ref 2.0–3.0)

## 2021-05-15 ENCOUNTER — Other Ambulatory Visit: Payer: Medicare Other

## 2021-05-15 ENCOUNTER — Other Ambulatory Visit: Payer: Self-pay

## 2021-05-15 ENCOUNTER — Ambulatory Visit (INDEPENDENT_AMBULATORY_CARE_PROVIDER_SITE_OTHER): Payer: Medicare Other | Admitting: Cardiovascular Disease

## 2021-05-15 VITALS — BP 106/44 | HR 68 | Temp 97.4°F | Resp 18 | Wt 111.0 lb

## 2021-05-15 DIAGNOSIS — I6350 Cerebral infarction due to unspecified occlusion or stenosis of unspecified cerebral artery: Secondary | ICD-10-CM

## 2021-05-15 DIAGNOSIS — Z515 Encounter for palliative care: Secondary | ICD-10-CM

## 2021-05-15 DIAGNOSIS — I359 Nonrheumatic aortic valve disorder, unspecified: Secondary | ICD-10-CM

## 2021-05-15 DIAGNOSIS — Z5181 Encounter for therapeutic drug level monitoring: Secondary | ICD-10-CM

## 2021-05-15 DIAGNOSIS — Z952 Presence of prosthetic heart valve: Secondary | ICD-10-CM

## 2021-05-15 NOTE — Progress Notes (Signed)
PATIENT NAME: HAROLD MATTES DOB: June 29, 1926 MRN: [id number]  PRIMARY CARE PROVIDER: Oval Linsey, MD  RESPONSIBLE PARTY:  Acct ID - Guarantor Home Phone Work Phone Relationship Acct Type  [id number] [name]* [phone]  Self P/F     [name], [place], [place] [address]    PLAN OF CARE and INTERVENTIONS:               1.  GOALS OF CARE/ ADVANCE CARE PLANNING:  Remain home under the care of her private sitter and daughter.               2.  PATIENT/CAREGIVER EDUCATION:  Safety               4. PERSONAL EMERGENCY PLAN:  Activate 911 for emergencies.               5.  DISEASE STATUS:  Joint visit with [place], SW, [name]-caregiver and patient.   Patient feels she is doing better than on our previous visit.  Caregiver reports patient is doing better since our last visit.  She continues to reports lower extremity weakness.  Using the cane periodically in the home.  Has transport chair and rolling walker to use outside of the home.  Caregiver notes patient stumbled a few times but no falls.  Balance is unsteady without assistive devices.   Portable O2 has been taken to [employer] and sent to manufacturer for replacement or to be fixed. Patient states she prefers the 3 lb portable tank rather than the cylinder.  Concentrator was replaced in the last week by [employer].  Patient has portable tanks through [employer].  Patient continues with shortness of breath with exertion but no worse.   Recent visit to PCP.  Patient will begin seeing Dr. Evette Doffing in the coming months as PCP is retiring.  Patient was started on Vitamin D3 due to low levels on recent blood work.   Patient's appetite remains good. Patient is going out to eat occasionally with her private caregiver.  She notes mostly veggies for lunch and sandwiches for dinner.   Bowel movements are regular.  Occasional issues with nausea.  Bowel are looser at times.   Caregiver continues with PT/INR checks and reports to heart clinic.    Next check will be next Thursday.   Carvedilol discontinued earlier this month.  Recent blood pressures: 129/59, 97/52, 113/70, 124/68, 116/47.  Patient reports recent passing of a close friend who was under hospice.  Caregiver stated hospice is "a little touchy" due to the recent passing of her friend.  Will discuss hospice care on next visit with patient.    HISTORY OF PRESENT ILLNESS:  87 year old female with CHF and COPD.  Patient is being followed by Palliative Care monthly and PRN.  CODE STATUS: DNR ADVANCED DIRECTIVES: N MOST FORM: No PPS: 50%   PHYSICAL EXAM:   VITALS: Today's Vitals   05/15/21 0913  BP: (!) 106/44  Pulse: 68  Resp: 18  Temp: (!) 97.4 F (36.3 C)  SpO2: 96%  Weight: 111 lb (50.3 kg)  PainSc: 0-No pain    LUNGS: clear to auscultation  CARDIAC: Cor RRR}  EXTREMITIES: - for edema SKIN: Skin color, texture, turgor normal. No rashes or lesions or normal  NEURO: positive for memory problems and weakness  Time 9 am-950 am.      Truitt Merle, RN

## 2021-05-15 NOTE — Progress Notes (Signed)
COMMUNITY PALLIATIVE CARE SW NOTE  PATIENT NAME: [name] DOB: 10-20-1926 MRN: [id number]  PRIMARY CARE PROVIDER: Lucia Gaskins, MD  RESPONSIBLE PARTY:  Acct ID - Guarantor Home Phone Work Phone Relationship Acct Type  [id number] - [name],[phone]  Self P/F     [name], [place], [place] [address]     PLAN OF CARE and INTERVENTIONS:             GOALS OF CARE/ ADVANCE CARE PLANNING:  Patient is a DNR. Patients daughter, [name], is POA. Patient's goal is to remain at home as independent as possible.   2.         SOCIAL/EMOTIONAL/SPIRITUAL ASSESSMENT/ INTERVENTIONS:  SW and RN met with patient and patient's live-in caregiver, [name], in patient's home for routine monthly visit. Patient lives in a one story home. Patient has chronic COPD and respiratory failure.   Patient shares that she is feeling fine today, better than yesterday. Patient shared that she was having some issues with her stomach, and believes that she may have gallbladder isues. Patient and caregiver updated SW medical condition and changes.  Patient has been weaker over the past week or so to the extent of not going out on her daily outings. Patient shares that her legs just feel weaker and has been having stomach discomfort. No falls reported. No pain reported, just weakness.  RN checked vitals. No recent medication changes. Patient's caregiver checks Coumadin at home. Most recent reading was 3.2, will be checked again next Thu. Caregiver also takes patients vitals and weights daily, weights have been stable, current weight is 111lbs. Patient takes Lasix Qmorning.   Patients portable O2 tank (OxygoFit) has malfunctioned and [place] [employer] was sent back to manufacturer to e fixed, this could take up to 2 months. Patient has other portable tanks and concentrator from [employer]. Patients daughter also has a back up smaller portable O2 concentrator for patient to use, similar to oxygoFit.   SW discussed goals,  reviewed care plan, provided emotional support, used active and reflective listening. Palliative care will continue to monitor and assist with long term care planning as needed.   3.         PATIENT/CAREGIVER EDUCATION/ COPING:  Patient continues with active lifestyle of going to the gym and riding the stationary bike, going out to lunch daily and going on leisure outings. Patient A&O x3, Pt is HOH but able to answer questions appropriately. Patient denies any anxiety or depression, patient takes Xanax HS. Patients family is supportive. Patient recently had a good friend pass away and is experiencing minimal sadness/grief due to this.   4.         PERSONAL EMERGENCY PLAN:  Patient will call 9-1-1 for emergencies.   5.         COMMUNITY RESOURCES COORDINATION/ HEALTH CARE NAVIGATION:  Patient and patients daughter manages her care.   6.         FINANCIAL/LEGAL CONCERNS/INTERVENTIONS:  None.      SOCIAL HX:  Social History   Tobacco Use   Smoking status: Former    Packs/day: 0.50    Pack years: 0.00    Types: Cigarettes    Quit date: 11/30/1991    Years since quitting: 29.4   Smokeless tobacco: Never   Tobacco comments:    Tobacco use-no  Substance Use Topics   Alcohol use: No    CODE STATUS: DNR ADVANCED DIRECTIVES: Y MOST FORM COMPLETE:  Y HOSPICE EDUCATION PROVIDED: N  PPS:  Patient is independent with ADL's.     Time spent: 45 min.      Doreene Eland,

## 2021-05-19 ENCOUNTER — Encounter (INDEPENDENT_AMBULATORY_CARE_PROVIDER_SITE_OTHER): Payer: Medicare Other | Admitting: Ophthalmology

## 2021-05-19 NOTE — Telephone Encounter (Signed)
Error

## 2021-05-21 ENCOUNTER — Ambulatory Visit (INDEPENDENT_AMBULATORY_CARE_PROVIDER_SITE_OTHER): Payer: Medicare Other | Admitting: Cardiovascular Disease

## 2021-05-21 DIAGNOSIS — Z5181 Encounter for therapeutic drug level monitoring: Secondary | ICD-10-CM

## 2021-05-21 DIAGNOSIS — I359 Nonrheumatic aortic valve disorder, unspecified: Secondary | ICD-10-CM | POA: Diagnosis not present

## 2021-05-21 DIAGNOSIS — I6350 Cerebral infarction due to unspecified occlusion or stenosis of unspecified cerebral artery: Secondary | ICD-10-CM

## 2021-05-21 DIAGNOSIS — Z952 Presence of prosthetic heart valve: Secondary | ICD-10-CM

## 2021-05-21 LAB — POCT INR: INR: 3.4 — AB (ref 2.0–3.0)

## 2021-05-28 ENCOUNTER — Other Ambulatory Visit: Payer: Medicare Other

## 2021-05-28 ENCOUNTER — Other Ambulatory Visit: Payer: Self-pay

## 2021-05-28 ENCOUNTER — Ambulatory Visit (INDEPENDENT_AMBULATORY_CARE_PROVIDER_SITE_OTHER): Payer: Medicare Other | Admitting: Cardiovascular Disease

## 2021-05-28 VITALS — BP 120/48 | HR 50 | Temp 97.5°F | Resp 18 | Wt 110.0 lb

## 2021-05-28 DIAGNOSIS — Z515 Encounter for palliative care: Secondary | ICD-10-CM

## 2021-05-28 DIAGNOSIS — I359 Nonrheumatic aortic valve disorder, unspecified: Secondary | ICD-10-CM | POA: Diagnosis not present

## 2021-05-28 DIAGNOSIS — I6350 Cerebral infarction due to unspecified occlusion or stenosis of unspecified cerebral artery: Secondary | ICD-10-CM

## 2021-05-28 DIAGNOSIS — Z7901 Long term (current) use of anticoagulants: Secondary | ICD-10-CM | POA: Diagnosis not present

## 2021-05-28 DIAGNOSIS — Z952 Presence of prosthetic heart valve: Secondary | ICD-10-CM

## 2021-05-28 DIAGNOSIS — Z5181 Encounter for therapeutic drug level monitoring: Secondary | ICD-10-CM

## 2021-05-28 LAB — POCT INR: INR: 2.2 (ref 2.0–3.0)

## 2021-05-28 NOTE — Progress Notes (Signed)
PATIENT NAME: ALESHIA CARTELLI DOB: 03/24/1926 MRN: [id number]  PRIMARY CARE PROVIDER: Oval Linsey, MD  RESPONSIBLE PARTY:  Acct ID - Guarantor Home Phone Work Phone Relationship Acct Type  [id number] [name]* [phone]  Self P/F     [name], [place], [place] [address]    PLAN OF CARE and INTERVENTIONS:               1.  GOALS OF CARE/ ADVANCE CARE PLANNING:  Patient desires to remain home under the care of her daughter and private sitter.                2.  PATIENT/CAREGIVER EDUCATION:  Education provided.               4. PERSONAL EMERGENCY PLAN:  Activate 911 for emergencies.               5.  DISEASE STATUS:  Visit completed with patient and sitter-[name].    Patient states she is still having some "bad days" with dizziness and stomach discomfort.  Yesterday and Sunday were reported as "bad days" and patient remained home.  She is going out again for lunches and went to the gym on Monday.    Patient is using a cane in the house and has a walker to use outside of the home.  No falls are reported.   PT/INR 2.2 this am.  [name] stated results were called in and they are awaiting a call back from cardiology regarding coumadin dosing.  Appetite is good and is eating 3 meals a day.  Patient continues to eat breakfast at home, out for lunch when feeling well and home meal for dinner.  Discussion completed with patient regarding hospice services.  Explained philosophy and additional support that can be provided in the home setting.  Advised of requirements for hospice eligibility.   Patient verbalizes understanding and questions answered.  At this time, she would like to keep Palliative Care.  Advised patient that Palliative Care would continue to follow patient.  Visit scheduled for August.  Advised patient and caregiver to contact PC should a visit be needed sooner.   Phone call made to [name] @ 345 pm.  Updated provided on my visit this am and discussion regarding hospice  services.    HISTORY OF PRESENT ILLNESS:  87 year old female with a hx of CHF.  Patient is being followed by Palliative Care monthly and PRN.   CODE STATUS: DNR ADVANCED DIRECTIVES: No MOST FORM: No PPS: 50%   PHYSICAL EXAM:   VITALS: Today's Vitals   05/28/21 1028  BP: (!) 120/48  Pulse: (!) 50  Resp: 18  Temp: (!) 97.5 F (36.4 C)  SpO2: 92%  Weight: 110 lb (49.9 kg)  PainSc: 0-No pain    LUNGS: positive findings: clear, decreased breath sounds CARDIAC: Cor RRR}  EXTREMITIES: - for edema SKIN: Skin color, texture, turgor normal. No rashes or lesions or normal  NEURO: positive for dizziness, gait problems, and memory problems       Truitt Merle, RN

## 2021-05-28 NOTE — Progress Notes (Signed)
COMMUNITY PALLIATIVE CARE SW NOTE  PATIENT NAME: [name] DOB: [birth year] MRN: [id number]  PRIMARY CARE PROVIDER: Oval Linsey, MD  RESPONSIBLE PARTY:  Acct ID - Guarantor Home Phone Work Phone Relationship Acct Type  [id number] LOUAN, BASE* [phone]  Self P/F     [name], [place], [place] [address]     Please see palliative care RN note for this visit. Palliative care SW not present for in person visit, due to schedule conflict.      Reina Fuse, [place]

## 2021-06-04 ENCOUNTER — Ambulatory Visit (INDEPENDENT_AMBULATORY_CARE_PROVIDER_SITE_OTHER): Payer: Medicare Other | Admitting: Cardiology

## 2021-06-04 DIAGNOSIS — I359 Nonrheumatic aortic valve disorder, unspecified: Secondary | ICD-10-CM

## 2021-06-04 DIAGNOSIS — Z952 Presence of prosthetic heart valve: Secondary | ICD-10-CM

## 2021-06-04 DIAGNOSIS — Z5181 Encounter for therapeutic drug level monitoring: Secondary | ICD-10-CM

## 2021-06-04 DIAGNOSIS — I6350 Cerebral infarction due to unspecified occlusion or stenosis of unspecified cerebral artery: Secondary | ICD-10-CM

## 2021-06-04 LAB — POCT INR: INR: 2.7 (ref 2.0–3.0)

## 2021-06-18 ENCOUNTER — Ambulatory Visit (INDEPENDENT_AMBULATORY_CARE_PROVIDER_SITE_OTHER): Payer: Medicare Other | Admitting: Pharmacist Clinician (PhC)/ Clinical Pharmacy Specialist

## 2021-06-18 DIAGNOSIS — I6350 Cerebral infarction due to unspecified occlusion or stenosis of unspecified cerebral artery: Secondary | ICD-10-CM

## 2021-06-18 DIAGNOSIS — Z5181 Encounter for therapeutic drug level monitoring: Secondary | ICD-10-CM

## 2021-06-18 DIAGNOSIS — Z7901 Long term (current) use of anticoagulants: Secondary | ICD-10-CM | POA: Diagnosis not present

## 2021-06-18 DIAGNOSIS — I359 Nonrheumatic aortic valve disorder, unspecified: Secondary | ICD-10-CM | POA: Diagnosis not present

## 2021-06-18 DIAGNOSIS — Z952 Presence of prosthetic heart valve: Secondary | ICD-10-CM

## 2021-06-18 LAB — POCT INR: INR: 2.7 (ref 2.0–3.0)

## 2021-06-22 ENCOUNTER — Other Ambulatory Visit: Payer: Self-pay

## 2021-06-22 ENCOUNTER — Ambulatory Visit (INDEPENDENT_AMBULATORY_CARE_PROVIDER_SITE_OTHER): Payer: Medicare Other | Admitting: Internal Medicine

## 2021-06-22 ENCOUNTER — Encounter: Payer: Self-pay | Admitting: Internal Medicine

## 2021-06-22 DIAGNOSIS — J9611 Chronic respiratory failure with hypoxia: Secondary | ICD-10-CM

## 2021-06-22 DIAGNOSIS — J449 Chronic obstructive pulmonary disease, unspecified: Secondary | ICD-10-CM | POA: Diagnosis not present

## 2021-06-22 DIAGNOSIS — J9612 Chronic respiratory failure with hypercapnia: Secondary | ICD-10-CM

## 2021-06-22 DIAGNOSIS — I6350 Cerebral infarction due to unspecified occlusion or stenosis of unspecified cerebral artery: Secondary | ICD-10-CM

## 2021-06-22 NOTE — Assessment & Plan Note (Signed)
Placed on 02 2014 by Dr [name]  - advised 01/01/2020 :  Monitor sats with goal of > 94% at rest and keep > 90% with ex -  HC03  03/04/20   =  36  -  06/23/2020   Walked RA  approx   300 ft  @ avg pace  stopped due to end of study, no sob with sats 98% at end     Again advise: Make sure you check your oxygen saturation  at your highest level of activity  to be sure it stays over 90% and adjust  02 flow upward to maintain this level if needed but remember to turn it back to previous settings when you stop (to conserve your supply).   F/u q 12 m         Each maintenance medication was reviewed in detail including emphasizing most importantly the difference between maintenance and prns and under what circumstances the prns are to be triggered using an action plan format where appropriate.  Total time for H and P, chart review, counseling, reviewing dpi, 02, hfa  device(s) and generating customized AVS unique to this office visit / same day charting =  25 min

## 2021-06-22 NOTE — Assessment & Plan Note (Signed)
Quit smoking around 1997  - maint on anoro and prn xopenex neb as of ov  06/23/2020   Pt is Group B in terms of symptom/risk and laba/lama therefore appropriate rx at this point >>>  anoro appropriate and prn saba / medrol dose packs for now

## 2021-06-22 NOTE — Progress Notes (Signed)
[name], female    DOB: [birth year],    MRN: [id number]   Brief patient profile:  41 yowf pt of [name] previously - she reports she quit smoking around 1997 s resp symptoms then  and placed on 02 around 2014 with dx of systolic chf and maint on anoro     History of Present Illness  01/01/2020  Pulmonary/ 1st office eval/Kamdyn Covel re severe copd post covid 10/2019 Chief Complaint  Patient presents with   Consult    Former Dr. Juanetta Simpson patient for severe COPD. Currently on 3L. Uses 4L at home. States her breathing has been ok.   Dyspnea: Baseline  goes to gym rides bike x 20 min s stopping on RA does not monitor sats Cough: none now Sleep: no resp ccs x 30 degrees HOB SABA use: neb daily "whether needs it or not "  (says doesn't think she does) rec Plan A = Automatic = Always=    Anoro one click each am  Plan B = Backup (does not replace plan A, it supplements it  - only use your levoalbuterol nebulizer up to every 4 hours if you can't catch your breath  Make sure you check your oxygen saturations at highest level of activity to be sure it stays over 90%   Goal at rest is to keep 02 above 94% at rest for your heart   Admit date: 02/05/2020 Discharge date: 02/09/2020       Home Health: yes Equipment/Devices: HHPT, RN, 3-4L [place]   Discharge Condition: Stable CODE STATUS: DNR Diet recommendation: Heart Healthy     Brief/Interim Summary: 87 year old female with a history of systolic and diastolic CHF, chronic respiratory failure on 4 L oxygen, hyperlipidemia, coronary artery disease, LBBB, coronary disease, CKD stage III, AVR presented with 1 to 2 days of worsening shortness of breath.  The patient states that she has had intermittent shortness of breath for at least 2 weeks with worsening over the last 1 to 2 days prior to admission.  She denies any fevers, chills, chest pain, nausea, vomiting, diarrhea, abdominal pain.  Upon presentation, the patient was noted to be fluid overloaded  with chest x-ray showing diffuse bilateral interstitial prominence and BNP 1712.  She was started on IV furosemide with good clinical effect. During the am 3/25, she became more dyspneic.  Repeat CXR and labs were ordered and duonebs were started.   Discharge Diagnoses:  Acute on chronic systolic and diastolic CHF -pt suppose to take 20 mg po bid at home, but she only takes it qday -01/22/2019 echo EF 25-30%, diffuse HK, moderate MR, moderate AI -Continue IV furosemide--dose increased to 40 bid when pt had resp distress 3/25 -Remains fluid overloaded with JVD -Daily weights -Accurate I's and O's--NEG 3.1L -SARS-CoV2--neg -d/c home with lasix 40 mg daily   Acute on Chronic respiratory failure with hypoxia and hypercarbia -usually on 4L at home -pt on 3L at time of d/c with saturation 97-98% -3/25--repeat CXR--personally reviewed--increased interstitial markings, sm pleural effusions -3/26--sob improved   CKD stage IIIb -Baseline creatinine 0.9-1.2 -Monitor with diuresis -serum creatinine 1.10 on day of d/c   COPD -Stable without wheezing -Discontinued IV steroids 3/24 -continued duonebs -continued pulmicort   Coronary artery disease status post CABG -No chest pain -Continue carvedilol -continue statin   Status post AVR -Continue warfarin with pharmacy assistance   Hypokalemia -Repleted -Magnesium 2.4   Hypertension -Blood pressure acceptable -Continue carvedilol   Cognitive impairment -Continue Aricept   Hyperlipidemia -Continue  statin   LBBB -chronic       06/23/2020  f/u ov/Chrisma Hurlock re:  COPD Group B / 02 hs and prn Chief Complaint  Patient presents with   Follow-up    No complaints  Dyspnea:  Gym 3 x week bike/ some days does mailbox 75 ft / flat  Cough: none  Sleeping: sleeping 30 degrees   SABA use: neb at hs  02: 3.5 hs and uses p ex/ does not check sats during ex  rec Make sure you check your oxygen saturations at highest level of activity to be  sure it stays over 90% and adjust upward to maintain      12/23/2020  f/u ov/[place] office/Letha Mirabal re:  GOLD  Group B/  02  4lpm "254/7"  Chief Complaint  Patient presents with   Follow-up    Shortness of breath with activity  Dyspnea: walking around the house ok/ still doing some gym / worse bending over or taking a shower Cough: none Sleeping: hosp bed about 30 degrees  SABA use: just hs  02:  4 lpm but not titrating, still wearing at rest.  Covid status: vax max  Lung cancer screening: n/a Rec Make sure you check your oxygen saturation  at your highest level of activity  to be sure it stays over 90% and adjust  02 flow upward to maintain this level if needed but remember to turn it back to previous settings when you stop (to conserve your supply).  For flares of difficulty breathing or arthritis 2 daily until better then taper off gradually    06/22/2021  f/u ov/[place] office/Saba Gomm re: group B copd / maint on Anoro   Chief Complaint  Patient presents with   Follow-up    No concerns at this time.  Dyspnea:  gym x one hour 3 times a week / not using 02 or checkings sats as rec Cough: none  Sleeping: hosp bed at 30 degrees  SABA use: not needing  prn xopenex  02: sleep 3.5 -4 per hawkins  Covid status: x 3  Not needing medrol dose packs (given for years by Dr Colleen GoslingHawkins as prn but not clear what dose she's used in past.    No obvious day to day or daytime variability or assoc excess/ purulent sputum or mucus plugs or hemoptysis or cp or chest tightness, subjective wheeze or overt sinus or hb symptoms.   Sleeping as above without nocturnal  or early am exacerbation  of respiratory  c/o's or need for noct saba. Also denies any obvious fluctuation of symptoms with weather or environmental changes or other aggravating or alleviating factors except as outlined above   No unusual exposure hx or h/o childhood pna/ asthma or knowledge of premature birth.  Current Allergies, Complete  Past Medical History, Past Surgical History, Family History, and Social History were reviewed in [facility] electronic medical record.  ROS  The following are not active complaints unless bolded Hoarseness, sore throat, dysphagia, dental problems, itching, sneezing,  nasal congestion or discharge of excess mucus or purulent secretions, ear ache,   fever, chills, sweats, unintended wt loss or wt gain, classically pleuritic or exertional cp,  orthopnea pnd or arm/hand swelling  or leg swelling, presyncope, palpitations, abdominal pain, anorexia, nausea, vomiting, diarrhea  or change in bowel habits or change in bladder habits, change in stools or change in urine, dysuria, hematuria,  rash, arthralgias, visual complaints, headache, numbness, weakness or ataxia or problems with walking or coordination,  change  in mood or  memory.        Current Meds  Medication Sig   acetaminophen (TYLENOL) 650 MG CR tablet Take 650 mg by mouth every 8 (eight) hours as needed for pain.   ALPRAZolam (XANAX) 0.25 MG tablet Take 0.125-0.25 mg by mouth at bedtime as needed for anxiety.    ANORO ELLIPTA 62.5-25 MCG/INH AEPB USE 1 INHALATION ONCE DAILY.   atorvastatin (LIPITOR) 40 MG tablet TAKE ONE TABLET BY MOUTH ONCE DAILY.   baclofen (LIORESAL) 10 MG tablet Take 10 mg by mouth as needed for muscle spasms.   docusate sodium (COLACE) 100 MG capsule Take 100 mg by mouth every evening.    donepezil (ARICEPT) 10 MG tablet TAKE ONE TABLET BY MOUTH ONCE DAILY. (Patient taking differently: Take 10 mg by mouth at bedtime.)   ergocalciferol (VITAMIN D2) 50000 UNITS capsule Take 50,000 Units by mouth every Tuesday.   ferrous sulfate 325 (65 FE) MG EC tablet Take 325 mg by mouth daily.    furosemide (LASIX) 40 MG tablet TAKE ONE TABLET BY MOUTH ONCE DAILY.   HYDROcodone-acetaminophen (NORCO) 7.5-325 MG tablet Take 1 tablet by mouth every 6 (six) hours as needed for moderate pain.   hydrocortisone (ANUSOL-HC) 2.5 % rectal cream  Place 1 application rectally 3 (three) times daily as needed for hemorrhoids or anal itching.   levalbuterol (XOPENEX) 0.63 MG/3ML nebulizer solution USE 1 VIAL VIA NEBULIZER EVERY 8 HOURS AS NEEDED.   meclizine (ANTIVERT) 25 MG tablet Take 25 mg by mouth 3 (three) times daily as needed for dizziness.   Melatonin 3 MG TABS Take 1 tablet by mouth at bedtime.   methylPREDNISolone (MEDROL) 4 MG tablet Take 4 mg by mouth 2 (two) times daily as needed.   multivitamin-lutein (OCUVITE-LUTEIN) CAPS capsule Take 1 capsule by mouth daily.   ondansetron (ZOFRAN) 4 MG tablet Take 4 mg by mouth every 8 (eight) hours as needed for nausea or vomiting.   polyethylene glycol (MIRALAX / GLYCOLAX) 17 g packet Take 17 g by mouth daily as needed for moderate constipation.   pregabalin (LYRICA) 75 MG capsule TAKE 1 CAPSULE BY MOUTH EVERY OTHER DAY.   spironolactone (ALDACTONE) 25 MG tablet TAKE (1/2) TABLET BY MOUTH DAILY.   traZODone (DESYREL) 50 MG tablet Take 0.5-1 tablets (25-50 mg total) by mouth at bedtime as needed for sleep.   warfarin (COUMADIN) 5 MG tablet TAKE 1/2 TO 1 TABLET ONCE DAILY AS DIRECTED BY COUMADIN CLINIC.   zinc sulfate 50 MG CAPS capsule Take 50 mg by mouth 2 (two) times daily.                    Past Medical History:  Diagnosis Date   Anemia    Anxiety    Arthritis    Blood transfusion without reported diagnosis    Carotid artery stenosis    Without infarction   Cataract    Chronic combined systolic (congestive) and diastolic (congestive) heart failure (HCC)    COPD (chronic obstructive pulmonary disease) (HCC)    Coronary artery disease    Heart valve replaced by other means    Hypercholesterolemia    Pure   Hypertension    Unspecified   LBBB (left bundle branch block)    Macular degeneration (senile) of retina, unspecified    On home O2    Postsurgical aortocoronary bypass status    Stroke (HCC)    Transient global amnesia    Unspecified hereditary and idiopathic  peripheral neuropathy  Unspecified vitamin D deficiency        Objective:    06/22/2021          115  12/23/2020          115 06/23/2020          115   03/26/20 112 lb (50.8 kg)  03/18/20 109 lb (49.4 kg)  03/03/20 111 lb 9.6 oz (50.6 kg)      Vital signs reviewed  06/22/2021  - Note at rest 02 sats  99% on RA   General appearance:    pleasant elderly wf nad    HEENT : pt wearing mask not removed for exam due to covid - 19 concerns.   NECK :  without JVD/Nodes/TM/ nl carotid upstrokes bilaterally   LUNGS: no acc muscle use,  Min barrel  / kyphotic contour chest wall with bilateral  slightly decreased bs s audible wheeze and  without cough on insp or exp maneuvers and min  Hyperresonant  to  percussion bilaterally     CV:  RRR  no s3 or murmur or increase in P2, and no edema   ABD:  soft and nontender with pos end  insp Hoover's  in the supine position. No bruits or organomegaly appreciated, bowel sounds nl  MS:   Nl gait/  ext warm without deformities, calf tenderness, cyanosis or clubbing No obvious joint restrictions   SKIN: warm and dry without lesions    NEURO:  alert, approp, nl sensorium with  no motor or cerebellar deficits apparent.                    Assessment

## 2021-06-22 NOTE — Patient Instructions (Addendum)
Make sure you check your oxygen saturation  at your highest level of activity  to be sure it stays over 90% and adjust  02 flow upward to maintain this level if needed but remember to turn it back to previous settings when you stop (to conserve your supply).    For flares of difficulty breathing or arthritis ok to take Medrol  pack and call me for refills but we'll the precise number in the pack and strength (I believe it's 5 mg)    I  recommend you get the 4th covid vaccination now based on your age and risk of infection    Please schedule a follow up visit in 12 months but call sooner if needed

## 2021-06-23 ENCOUNTER — Ambulatory Visit: Payer: Medicare Other | Admitting: Cardiology

## 2021-06-24 NOTE — Progress Notes (Signed)
HPI: FU coronary artery disease (s/p CABG), prior aortic valve replacement and aortic insufficiency. Last cardiac catheterization in 2011 revealed normal left main; 30% first diagonal, 50% LAD and 60% second diagonal. There was a 40-50% circumflex and a 40% right coronary artery. All grafts were occluded. Medical therapy recommended. Transesophageal echocardiogram in February of 2012 showed an ejection fraction of 45%. There was a [employer] Jude aortic valve with moderate perivalvular aortic insufficiency. There was moderate mitral regurgitation, moderate left atrial enlargement, mild right atrial enlargement and mild tricuspid regurgitation. Carotid dopplers August 2020 showed 40 to 59% right and 1 to 39% left stenosis.  Echocardiogram October 2021 interpreted as normal LV function, moderate left ventricular hypertrophy, grade 1 diastolic dysfunction, biatrial enlargement, mild to moderate mitral regurgitation, previous aortic valve replacement with moderate aortic insufficiency. I personally reviewed the images and ejection fraction appears to be 40-45%. CTA October 2021 showed 2.9 x 3.1 cm abdominal aortic aneurysm. Since she was last seen, she denies dyspnea, chest pain, palpitations, syncope or bleeding.  No falls.  Current Outpatient Medications  Medication Sig Dispense Refill   acetaminophen (TYLENOL) 650 MG CR tablet Take 650 mg by mouth every 8 (eight) hours as needed for pain.     ALPRAZolam (XANAX) 0.25 MG tablet Take 0.125-0.25 mg by mouth at bedtime as needed for anxiety.      ANORO ELLIPTA 62.5-25 MCG/INH AEPB USE 1 INHALATION ONCE DAILY. 60 each 11   atorvastatin (LIPITOR) 40 MG tablet TAKE ONE TABLET BY MOUTH ONCE DAILY. 90 tablet 0   baclofen (LIORESAL) 10 MG tablet Take 10 mg by mouth as needed for muscle spasms.     docusate sodium (COLACE) 100 MG capsule Take 100 mg by mouth every evening.      donepezil (ARICEPT) 10 MG tablet TAKE ONE TABLET BY MOUTH ONCE DAILY. (Patient taking  differently: Take 10 mg by mouth at bedtime.) 30 tablet 0   ergocalciferol (VITAMIN D2) 50000 UNITS capsule Take 50,000 Units by mouth every Tuesday.     ferrous sulfate 325 (65 FE) MG EC tablet Take 325 mg by mouth daily.      furosemide (LASIX) 40 MG tablet TAKE ONE TABLET BY MOUTH ONCE DAILY. 90 tablet 3   HYDROcodone-acetaminophen (NORCO) 7.5-325 MG tablet Take 1 tablet by mouth every 6 (six) hours as needed for moderate pain.     hydrocortisone (ANUSOL-HC) 2.5 % rectal cream Place 1 application rectally 3 (three) times daily as needed for hemorrhoids or anal itching.     levalbuterol (XOPENEX) 0.63 MG/3ML nebulizer solution USE 1 VIAL VIA NEBULIZER EVERY 8 HOURS AS NEEDED. 150 mL 3   meclizine (ANTIVERT) 25 MG tablet Take 25 mg by mouth 3 (three) times daily as needed for dizziness.     Melatonin 3 MG TABS Take 1 tablet by mouth at bedtime.     methylPREDNISolone (MEDROL) 4 MG tablet Take 4 mg by mouth 2 (two) times daily as needed.     multivitamin-lutein (OCUVITE-LUTEIN) CAPS capsule Take 1 capsule by mouth daily.     ondansetron (ZOFRAN) 4 MG tablet Take 4 mg by mouth every 8 (eight) hours as needed for nausea or vomiting.     polyethylene glycol (MIRALAX / GLYCOLAX) 17 g packet Take 17 g by mouth daily as needed for moderate constipation.     pregabalin (LYRICA) 75 MG capsule TAKE 1 CAPSULE BY MOUTH EVERY OTHER DAY. 15 capsule 0   spironolactone (ALDACTONE) 25 MG tablet TAKE (1/2) TABLET  BY MOUTH DAILY. 45 tablet 3   traZODone (DESYREL) 50 MG tablet Take 0.5-1 tablets (25-50 mg total) by mouth at bedtime as needed for sleep. 30 tablet 3   warfarin (COUMADIN) 5 MG tablet TAKE 1/2 TO 1 TABLET ONCE DAILY AS DIRECTED BY COUMADIN CLINIC. 90 tablet 0   zinc sulfate 50 MG CAPS capsule Take 50 mg by mouth 2 (two) times daily.      No current facility-administered medications for this visit.     Past Medical History:  Diagnosis Date   Anemia    Anxiety    Arthritis    Blood transfusion  without reported diagnosis    Carotid artery stenosis    Without infarction   Cataract    Chronic combined systolic (congestive) and diastolic (congestive) heart failure (HCC)    COPD (chronic obstructive pulmonary disease) (HCC)    Wears 02 at home 2 L   Coronary artery disease    Heart valve replaced by other means    Hypercholesterolemia    Pure   Hypertension    Unspecified   LBBB (left bundle branch block)    Macular degeneration (senile) of retina, unspecified    On home O2    Postsurgical aortocoronary bypass status    Stroke (HCC)    Transient global amnesia    Unspecified hereditary and idiopathic peripheral neuropathy    Unspecified vitamin D deficiency     Past Surgical History:  Procedure Laterality Date   AORTIC VALVE REPLACEMENT     APPENDECTOMY     CORONARY ARTERY BYPASS GRAFT     ERCP N/A 09/13/2020   Procedure: ENDOSCOPIC RETROGRADE CHOLANGIOPANCREATOGRAPHY (ERCP);  Surgeon: Rachael Fee, MD;  Location[facility] ENDOSCOPY;  Service: Endoscopy;  Laterality: N/A;   HIP PINNING,CANNULATED Right 07/18/2018   Procedure: RIGHT CANNULATED HIP PINNING;  Surgeon: Myrene Galas, MD;  Location: [facility] OR;  Service: Orthopedics;  Laterality: Right;   REMOVAL OF STONES  09/13/2020   Procedure: REMOVAL OF STONES;  Surgeon: Rachael Fee, MD;  Location: [facility] ENDOSCOPY;  Service: Endoscopy;;   SPHINCTEROTOMY  09/13/2020   Procedure: Dennison Mascot;  Surgeon: Rachael Fee, MD;  Location: [facility] ENDOSCOPY;  Service: Endoscopy;;   TONSILLECTOMY      Social History   Socioeconomic History   Marital status: Widowed    Spouse name: Not on file   Number of children: 2   Years of education: 12th   Highest education level: Not on file  Occupational History    Employer: RETIRED  Tobacco Use   Smoking status: Former    Packs/day: 0.50    Types: Cigarettes    Quit date: 11/30/1991    Years since quitting: 29.5   Smokeless tobacco: Never   Tobacco comments:    Tobacco use-no   Substance and Sexual Activity   Alcohol use: No   Drug use: No   Sexual activity: Never  Other Topics Concern   Not on file  Social History Narrative   Pt lives at home alone.   Caffeine Use: very rarely   Social Determinants of Corporate investment banker Strain: Not on file  Food Insecurity: Not on file  Transportation Needs: Not on file  Physical Activity: Not on file  Stress: Not on file  Social Connections: Not on file  Intimate Partner Violence: Not on file    Family History  Problem Relation Age of Onset   Heart attack Father    Heart disease Father    Stroke Father  Stroke Sister    Hypertension Mother    Stroke Mother    Neuropathy Brother    COPD Sister     ROS: no fevers or chills, productive cough, hemoptysis, dysphasia, odynophagia, melena, hematochezia, dysuria, hematuria, rash, seizure activity, orthopnea, PND, pedal edema, claudication. Remaining systems are negative.  Physical Exam: Well-developed well-nourished in no acute distress.  Skin is warm and dry.  HEENT is normal.  Neck is supple.  Chest is clear to auscultation with normal expansion.  Cardiovascular exam is regular rate and rhythm.  Crisp mechanical valve sound.  2/6 systolic and diastolic murmur left sternal border. Abdominal exam nontender or distended. No masses palpated. Extremities show no edema. neuro grossly intact   A/P  1 chronic systolic congestive heart failure-patient doing reasonably well from a volume standpoint.  We will continue diuretics at present dose.  Check potassium and renal function.  2 cardiomyopathy-patient has had problems with orthostasis and low blood pressure.  We have therefore discontinued her beta-blocker and ARB.  3 history of aortic valve replacement-we will continue Coumadin.  She has a history of moderate aortic insufficiency.  However we are treating conservatively given her age.  Check hemoglobin.  4 hypertension-patient's blood pressure is  controlled.  5 hyperlipidemia-continue statin.  Check lipids and liver.  6 coronary artery disease-continue statin.  She is not on aspirin given need for Coumadin.  7 carotid artery disease-given age and overall medical condition patient would not be a good candidate for surgical intervention.  We have therefore elected not to pursue follow-up carotid Dopplers.  8 abdominal aortic aneurysm-as above we will not pursue follow-up imaging as she would not be acute candidate repair.  [name], MD

## 2021-06-26 ENCOUNTER — Other Ambulatory Visit: Payer: Self-pay

## 2021-06-26 ENCOUNTER — Ambulatory Visit (INDEPENDENT_AMBULATORY_CARE_PROVIDER_SITE_OTHER): Payer: Medicare Other | Admitting: Cardiology

## 2021-06-26 ENCOUNTER — Encounter: Payer: Self-pay | Admitting: Cardiology

## 2021-06-26 VITALS — BP 134/60 | HR 62 | Ht 62.0 in | Wt 116.4 lb

## 2021-06-26 DIAGNOSIS — Z952 Presence of prosthetic heart valve: Secondary | ICD-10-CM | POA: Diagnosis not present

## 2021-06-26 DIAGNOSIS — I10 Essential (primary) hypertension: Secondary | ICD-10-CM

## 2021-06-26 DIAGNOSIS — I420 Dilated cardiomyopathy: Secondary | ICD-10-CM

## 2021-06-26 DIAGNOSIS — I2510 Atherosclerotic heart disease of native coronary artery without angina pectoris: Secondary | ICD-10-CM

## 2021-06-26 DIAGNOSIS — I359 Nonrheumatic aortic valve disorder, unspecified: Secondary | ICD-10-CM | POA: Diagnosis not present

## 2021-06-26 DIAGNOSIS — I6350 Cerebral infarction due to unspecified occlusion or stenosis of unspecified cerebral artery: Secondary | ICD-10-CM

## 2021-06-26 DIAGNOSIS — E7800 Pure hypercholesterolemia, unspecified: Secondary | ICD-10-CM

## 2021-06-26 LAB — LIPID PANEL
Chol/HDL Ratio: 2.3 ratio (ref 0.0–4.4)
Cholesterol, Total: 152 mg/dL (ref 100–199)
HDL: 66 mg/dL (ref >39)
LDL Chol Calc (NIH): 72 mg/dL (ref 0–99)
Triglycerides: 69 mg/dL (ref 0–149)
VLDL Cholesterol Cal: 14 mg/dL (ref 5–40)

## 2021-06-26 LAB — COMPREHENSIVE METABOLIC PANEL
ALT: 18 IU/L (ref 0–32)
AST: 27 IU/L (ref 0–40)
Albumin/Globulin Ratio: 1.8 (ref 1.2–2.2)
Albumin: 4.2 g/dL (ref 3.5–4.6)
Alkaline Phosphatase: 104 IU/L (ref 44–121)
BUN/Creatinine Ratio: 19 (ref 12–28)
BUN: 26 mg/dL (ref 10–36)
Bilirubin Total: 0.4 mg/dL (ref 0.0–1.2)
CO2: 24 mmol/L (ref 20–29)
Calcium: 9.3 mg/dL (ref 8.7–10.3)
Chloride: 103 mmol/L (ref 96–106)
Creatinine, Ser: 1.34 mg/dL — ABNORMAL HIGH (ref 0.57–1.00)
Globulin, Total: 2.4 g/dL (ref 1.5–4.5)
Glucose: 77 mg/dL (ref 65–99)
Potassium: 3.6 mmol/L (ref 3.5–5.2)
Sodium: 142 mmol/L (ref 134–144)
Total Protein: 6.6 g/dL (ref 6.0–8.5)
eGFR: 37 mL/min/{1.73_m2} — ABNORMAL LOW (ref >59)

## 2021-06-26 LAB — CBC
Hematocrit: 29.2 % — ABNORMAL LOW (ref 34.0–46.6)
Hemoglobin: 9.8 g/dL — ABNORMAL LOW (ref 11.1–15.9)
MCH: 30.4 pg (ref 26.6–33.0)
MCHC: 33.6 g/dL (ref 31.5–35.7)
MCV: 91 fL (ref 79–97)
Platelets: 206 10*3/uL (ref 150–450)
RBC: 3.22 x10E6/uL — ABNORMAL LOW (ref 3.77–5.28)
RDW: 11.9 % (ref 11.7–15.4)
WBC: 10.2 10*3/uL (ref 3.4–10.8)

## 2021-06-26 NOTE — Patient Instructions (Signed)

## 2021-07-02 ENCOUNTER — Ambulatory Visit (INDEPENDENT_AMBULATORY_CARE_PROVIDER_SITE_OTHER): Payer: Medicare Other | Admitting: Cardiology

## 2021-07-02 DIAGNOSIS — Z5181 Encounter for therapeutic drug level monitoring: Secondary | ICD-10-CM

## 2021-07-02 DIAGNOSIS — Z7901 Long term (current) use of anticoagulants: Secondary | ICD-10-CM | POA: Diagnosis not present

## 2021-07-02 DIAGNOSIS — I359 Nonrheumatic aortic valve disorder, unspecified: Secondary | ICD-10-CM | POA: Diagnosis not present

## 2021-07-02 DIAGNOSIS — I6350 Cerebral infarction due to unspecified occlusion or stenosis of unspecified cerebral artery: Secondary | ICD-10-CM

## 2021-07-02 DIAGNOSIS — Z952 Presence of prosthetic heart valve: Secondary | ICD-10-CM | POA: Diagnosis not present

## 2021-07-02 LAB — POCT INR: INR: 2.6 (ref 2.0–3.0)

## 2021-07-03 ENCOUNTER — Encounter: Payer: Self-pay | Admitting: *Deleted

## 2021-07-03 ENCOUNTER — Other Ambulatory Visit: Payer: Self-pay

## 2021-07-03 ENCOUNTER — Other Ambulatory Visit: Payer: Medicare Other

## 2021-07-03 VITALS — BP 132/48 | HR 64 | Temp 97.7°F | Resp 22 | Wt 111.0 lb

## 2021-07-03 DIAGNOSIS — Z515 Encounter for palliative care: Secondary | ICD-10-CM

## 2021-07-03 NOTE — Progress Notes (Signed)
COMMUNITY PALLIATIVE CARE SW NOTE  PATIENT NAME: [name] DOB: 27-Oct-1926 MRN: [id number]  PRIMARY CARE PROVIDER: Celene Squibb, MD  RESPONSIBLE PARTY:  Acct ID - Guarantor Home Phone Work Phone Relationship Acct Type  [id number] [name]  Self P/F     [address], [place], [place] [id number]     PLAN OF CARE and INTERVENTIONS:             GOALS OF CARE/ ADVANCE CARE PLANNING:  Patient is a DNR. Patients daughter, [name], is POA. Patient's goal is to remain at home as independent as possible.   2.         SOCIAL/EMOTIONAL/SPIRITUAL ASSESSMENT/ INTERVENTIONS:  SW and RN met with patient and patient's live-in caregiver, [name], in patient's home for routine monthly visit. Patient lives in a one story home. Patient has chronic COPD and respiratory failure.   Patient shares that she is feeling good today. Patient and caregiver updated PC medical condition and changes.  Patient has improved since previous visit. No feelings of weakness. No falls reported. No pain reported other than occasional stomach discomfort. Patient shares that she continues to have issues   Patient recently saw new PCP Dr. Nevada Crane, no concerns and no new orders. Patient also saw pulmonology, no concerns and no new orders.    RN checked vitals. No recent medication changes. Patient's caregiver checks Coumadin at home. Most recent reading was 2.6. Caregiver also takes patients vitals and weights daily, weights have been stable, current weight is 111lbs. Patient takes Lasix Qmorning.    Patient has her portable O2 tank (OxygoFit) back and is working fine. Patient continues to go out daily with caregiver and visit the gym. Patient does not use any AD to ambulate while out ut does take [facility] if needed.   SW discussed goals, reviewed care plan, provided emotional support, used active and reflective listening. Palliative care will continue to monitor and assist with long term care planning as needed.   3.          PATIENT/CAREGIVER EDUCATION/ COPING:  Patient continues with active lifestyle of going to the gym and riding the stationary bike, going out to lunch daily and going on leisure outings. Patient A&O x3, Pt is HOH but able to answer questions appropriately. Patient denies any anxiety or depression, patient takes Xanax HS. Patients family is supportive.   4.         PERSONAL EMERGENCY PLAN:  Patient will call 9-1-1 for emergencies.   5.         COMMUNITY RESOURCES COORDINATION/ HEALTH CARE NAVIGATION:  Patient and patients daughter manages her care.   6.         FINANCIAL/LEGAL CONCERNS/INTERVENTIONS:  None.      SOCIAL HX:  Social History   Tobacco Use   Smoking status: Former    Packs/day: 0.50    Types: Cigarettes    Quit date: 11/30/1991    Years since quitting: 29.6   Smokeless tobacco: Never   Tobacco comments:    Tobacco use-no  Substance Use Topics   Alcohol use: No    CODE STATUS: DNR ADVANCED DIRECTIVES: Y MOST FORM COMPLETE:  N HOSPICE EDUCATION PROVIDED: N  PPS: Patient is independent with all ADL's.      Doreene Eland, [place]

## 2021-07-03 NOTE — Progress Notes (Signed)
PATIENT NAME: [name] DOB: Apr 11, 1926 MRN: [id number]  PRIMARY CARE PROVIDER: Benita Stabile, MD  RESPONSIBLE PARTY:  Acct ID - Guarantor Home Phone Work Phone Relationship Acct Type  [id number] [name]* [phone]  Self P/F     [name], [place], [place] [address]    PLAN OF CARE and INTERVENTIONS:               1.  GOALS OF CARE/ ADVANCE CARE PLANNING:  Remain home under the care of her daughter and private caregiver.               2.  PATIENT/CAREGIVER EDUCATION:  Sleep               4. PERSONAL EMERGENCY PLAN:  Activate 911 for emergencies.               5.  DISEASE STATUS:  Joint visit completed with [place], SW, [name]-caregiver and patient.  Patient ambulating into the dinning room after using the bathroom.    Blood pressures have improved. Patient is no longer complaining of dizziness.  Her fluid intake remains good.  Patient is using a cane when ambulating outside of the home.  No falls are reported.  Patient continues to visit the gym 3 x a week.  She is mostly focusing on leg exercises and enjoys the socialization.  PT/INR continues to be checked at home and reported to Cardiology.   Respiratory status is unchanged.  Caregiver states nebulizer treatments have not been required in months.  Patient has not reporting any shortness of breath with exertion.   Appetite remains good with 3 meals being consumed.  Patient is eating out at lunch time.  Weights remain stable between 110-111 lbs.    Portable oxygen tank has been corrected and was returned about 2 weeks ago.  No further issues are reported.  Patient completed recent follow up visits with speciality providers.  No new concerns.  Patient is now established with Dr. Margo Aye and has seen the PA in the last month.  She will follow up with PCP in the next 2 months for a routine visit.  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    Patient notes some  issues with insomnia.  She has Trazodone but is not taking this due to the side effects.  Currently taking melatonin 3 mg nightly.  Encouraged to consider discussing melatonin increase with PCP.   HISTORY OF PRESENT ILLNESS:  87 year old female with COPD and CHF.  Patient is being followed by Palliative Care monthly and PRN.  CODE STATUS: DNR ADVANCED DIRECTIVES: N MOST FORM: No PPS: 50%   PHYSICAL EXAM:   VITALS: Today's Vitals   07/03/21 0907  BP: (!) 132/48  Pulse: 64  Resp: (!) 22  Temp: 97.7 F (36.5 C)  SpO2: 97%  Weight: 111 lb (50.3 kg)  PainSc: 0-No pain    LUNGS: clear to auscultation  CARDIAC: Cor RRR}  EXTREMITIES: - for edema SKIN: Skin color, texture, turgor normal. No rashes or lesions or normal, temperature normal, and texture normal  NEURO: positive for gait problems and memory problems       Truitt Merle, RN

## 2021-07-06 ENCOUNTER — Encounter (INDEPENDENT_AMBULATORY_CARE_PROVIDER_SITE_OTHER): Payer: Medicare Other | Admitting: Ophthalmology

## 2021-07-07 ENCOUNTER — Other Ambulatory Visit: Payer: Self-pay

## 2021-07-07 ENCOUNTER — Ambulatory Visit (INDEPENDENT_AMBULATORY_CARE_PROVIDER_SITE_OTHER): Payer: Medicare Other | Admitting: Ophthalmology

## 2021-07-07 ENCOUNTER — Encounter (INDEPENDENT_AMBULATORY_CARE_PROVIDER_SITE_OTHER): Payer: Self-pay | Admitting: Ophthalmology

## 2021-07-07 DIAGNOSIS — H353124 Nonexudative age-related macular degeneration, left eye, advanced atrophic with subfoveal involvement: Secondary | ICD-10-CM | POA: Diagnosis not present

## 2021-07-07 DIAGNOSIS — H353221 Exudative age-related macular degeneration, left eye, with active choroidal neovascularization: Secondary | ICD-10-CM

## 2021-07-07 MED ORDER — AFLIBERCEPT 2MG/0.05ML IZ SOLN FOR KALEIDOSCOPE
2.0000 mg | INTRAVITREAL | Status: AC | PRN
  Administered 2021-07-07: 2 mg via INTRAVITREAL

## 2021-07-07 NOTE — Assessment & Plan Note (Signed)
Region nasal and superonasal to the FAZ much less active as compared to February and March 2022 currently at 8-week follow-up post Eylea.  Repeat injection today and examination next OS in 10 weeks

## 2021-07-07 NOTE — Progress Notes (Signed)
07/07/2021     CHIEF COMPLAINT Patient presents for No chief complaint on file.     HISTORY OF PRESENT ILLNESS: [name] is a 87 y.o. female who presents to the clinic today for:   HPI   8 week fu ou oct eylea os Patient states vision is stable and unchanged since last visit. Denies any new floaters or FOL.  Last edited by Nelva Nay, COA on 07/07/2021 10:52 AM.      Referring physician: Oval Linsey, MD [address] [place],  [place] [address]  HISTORICAL INFORMATION:   Selected notes from the MEDICAL RECORD NUMBER    Lab Results  Component Value Date   HGBA1C 5.7 (H) 10/27/2019     CURRENT MEDICATIONS: No current outpatient medications on file. (Ophthalmic Drugs)   No current facility-administered medications for this visit. (Ophthalmic Drugs)   Current Outpatient Medications (Other)  Medication Sig   acetaminophen (TYLENOL) 650 MG CR tablet Take 650 mg by mouth every 8 (eight) hours as needed for pain.   ALPRAZolam (XANAX) 0.25 MG tablet Take 0.125-0.25 mg by mouth at bedtime as needed for anxiety.    ANORO ELLIPTA 62.5-25 MCG/INH AEPB USE 1 INHALATION ONCE DAILY.   atorvastatin (LIPITOR) 40 MG tablet TAKE ONE TABLET BY MOUTH ONCE DAILY.   baclofen (LIORESAL) 10 MG tablet Take 10 mg by mouth as needed for muscle spasms.   docusate sodium (COLACE) 100 MG capsule Take 100 mg by mouth every evening.    donepezil (ARICEPT) 10 MG tablet TAKE ONE TABLET BY MOUTH ONCE DAILY. (Patient taking differently: Take 10 mg by mouth at bedtime.)   ergocalciferol (VITAMIN D2) 50000 UNITS capsule Take 50,000 Units by mouth every Tuesday.   ferrous sulfate 325 (65 FE) MG EC tablet Take 325 mg by mouth daily.    furosemide (LASIX) 40 MG tablet TAKE ONE TABLET BY MOUTH ONCE DAILY.   HYDROcodone-acetaminophen (NORCO) 7.5-325 MG tablet Take 1 tablet by mouth every 6 (six) hours as needed for moderate pain.   hydrocortisone (ANUSOL-HC) 2.5 % rectal cream Place 1  application rectally 3 (three) times daily as needed for hemorrhoids or anal itching.   levalbuterol (XOPENEX) 0.63 MG/3ML nebulizer solution USE 1 VIAL VIA NEBULIZER EVERY 8 HOURS AS NEEDED.   meclizine (ANTIVERT) 25 MG tablet Take 25 mg by mouth 3 (three) times daily as needed for dizziness.   Melatonin 3 MG TABS Take 1 tablet by mouth at bedtime.   methylPREDNISolone (MEDROL) 4 MG tablet Take 4 mg by mouth 2 (two) times daily as needed.   multivitamin-lutein (OCUVITE-LUTEIN) CAPS capsule Take 1 capsule by mouth daily.   ondansetron (ZOFRAN) 4 MG tablet Take 4 mg by mouth every 8 (eight) hours as needed for nausea or vomiting.   polyethylene glycol (MIRALAX / GLYCOLAX) 17 g packet Take 17 g by mouth daily as needed for moderate constipation.   pregabalin (LYRICA) 75 MG capsule TAKE 1 CAPSULE BY MOUTH EVERY OTHER DAY.   spironolactone (ALDACTONE) 25 MG tablet TAKE (1/2) TABLET BY MOUTH DAILY.   traZODone (DESYREL) 50 MG tablet Take 0.5-1 tablets (25-50 mg total) by mouth at bedtime as needed for sleep.   warfarin (COUMADIN) 5 MG tablet TAKE 1/2 TO 1 TABLET ONCE DAILY AS DIRECTED BY COUMADIN CLINIC.   zinc sulfate 50 MG CAPS capsule Take 50 mg by mouth 2 (two) times daily.    No current facility-administered medications for this visit. (Other)      REVIEW OF  SYSTEMS:    ALLERGIES Allergies  Allergen Reactions   Codeine Nausea And Vomiting   Penicillins Other (See Comments)    Unknown reaction - listed on MAR Has patient had a PCN reaction causing immediate rash, facial/tongue/throat swelling, SOB or lightheadedness with hypotension: Unknown Has patient had a PCN reaction causing severe rash involving mucus membranes or skin necrosis: Unknown Has patient had a PCN reaction that required hospitalization: Unknown Has patient had a PCN reaction occurring within the last 10 years: Unknown If all of the above answers are "NO", then may proceed with Cephalosporin use.    PAST MEDICAL  HISTORY Past Medical History:  Diagnosis Date   Anemia    Anxiety    Arthritis    Blood transfusion without reported diagnosis    Carotid artery stenosis    Without infarction   Cataract    Chronic combined systolic (congestive) and diastolic (congestive) heart failure (HCC)    COPD (chronic obstructive pulmonary disease) (HCC)    Wears 02 at home 2 L   Coronary artery disease    Heart valve replaced by other means    Hypercholesterolemia    Pure   Hypertension    Unspecified   LBBB (left bundle branch block)    Macular degeneration (senile) of retina, unspecified    On home O2    Postsurgical aortocoronary bypass status    Stroke (HCC)    Transient global amnesia    Unspecified hereditary and idiopathic peripheral neuropathy    Unspecified vitamin D deficiency    Past Surgical History:  Procedure Laterality Date   AORTIC VALVE REPLACEMENT     APPENDECTOMY     CORONARY ARTERY BYPASS GRAFT     ERCP N/A 09/13/2020   Procedure: ENDOSCOPIC RETROGRADE CHOLANGIOPANCREATOGRAPHY (ERCP);  Surgeon: Rachael Fee, MD;  Location: [facility] ENDOSCOPY;  Service: Endoscopy;  Laterality: N/A;   HIP PINNING,CANNULATED Right 07/18/2018   Procedure: RIGHT CANNULATED HIP PINNING;  Surgeon: Myrene Galas, MD;  Location: [facility] OR;  Service: Orthopedics;  Laterality: Right;   REMOVAL OF STONES  09/13/2020   Procedure: REMOVAL OF STONES;  Surgeon: Rachael Fee, MD;  Location: [facility] ENDOSCOPY;  Service: Endoscopy;;   SPHINCTEROTOMY  09/13/2020   Procedure: Dennison Mascot;  Surgeon: Rachael Fee, MD;  Location: [facility] ENDOSCOPY;  Service: Endoscopy;;   TONSILLECTOMY      FAMILY HISTORY Family History  Problem Relation Age of Onset   Heart attack Father    Heart disease Father    Stroke Father    Stroke Sister    Hypertension Mother    Stroke Mother    Neuropathy Brother    COPD Sister     SOCIAL HISTORY Social History   Tobacco Use   Smoking status: Former    Packs/day: 0.50    Types:  Cigarettes    Quit date: 11/30/1991    Years since quitting: 29.6   Smokeless tobacco: Never   Tobacco comments:    Tobacco use-no  Substance Use Topics   Alcohol use: No   Drug use: No         OPHTHALMIC EXAM:  Base Eye Exam     Visual Acuity (ETDRS)       Right Left   Dist cc 20/80 -2 20/200   Dist ph cc 20/50 -2 20/100 +2         Tonometry (Tonopen, 10:52 AM)       Right Left   Pressure 8 9  Pupils       Pupils Dark Light Shape React APD   Right PERRL 1 1 Round Minimal None   Left PERRL 1 1 Round Minimal None         Visual Fields (Counting fingers)       Left Right    Full Full         Extraocular Movement       Right Left    Full Full         Neuro/Psych     Oriented x3: Yes   Mood/Affect: Normal         Dilation     Both eyes: 1.0% Mydriacyl, 2.5% Phenylephrine @ 10:52 AM           Slit Lamp and Fundus Exam     External Exam       Right Left   External Normal Normal         Slit Lamp Exam       Right Left   Lids/Lashes Normal Normal   Conjunctiva/Sclera White and quiet White and quiet   Cornea Clear Clear   Anterior Chamber Deep and quiet Deep and quiet   Iris Round and reactive,pinhole Round and reactive   Lens Clear Centered posterior chamber intraocular lens   Anterior Vitreous Normal Normal         Fundus Exam       Right Left   Posterior Vitreous  Posterior vitreous detachment, Central vitreous floaters   Disc  Normal, collateral vessels on the nerve   C/D Ratio  0.35   Macula  No Subretinal hemorrhage, no macular thickening, Soft drusen, Geographic atrophy   Vessels  Normal   Periphery  Normal            IMAGING AND PROCEDURES  Imaging and Procedures for 07/07/21  Intravitreal Injection, Pharmacologic Agent - OS - Left Eye       Time Out 07/07/2021. 11:23 AM. Confirmed correct patient, procedure, site, and patient consented.   Anesthesia Topical anesthesia was used.  Anesthetic medications included Akten 3.5%.   Procedure Preparation included 5% betadine to ocular surface, 10% betadine to eyelids, Tobramycin 0.3%. A 30 gauge needle was used.   Injection: 2 mg aflibercept 2 MG/0.05ML   Route: Intravitreal, Site: Left Eye   NDC: [id number], Lot: [id number], Waste: 0 mL   Post-op Post injection exam found visual acuity of at least counting fingers. The patient tolerated the procedure well. There were no complications. The patient received written and verbal post procedure care education. Post injection medications were not given.      OCT, Retina - OU - Both Eyes       Right Eye Quality was good. Scan locations included subfoveal. Central Foveal Thickness: 241. Progression has been stable. Findings include abnormal foveal contour, retinal drusen .   Left Eye Quality was good. Scan locations included subfoveal. Central Foveal Thickness: 251. Progression has been stable. Findings include abnormal foveal contour, retinal drusen .   Notes OD, stable overall  OS with much improved perifoveal wet AMD post Eylea, 8-week follow-up, much improved since Feb, 2022, repeat injection today and extend interval examination next to 10 weeks             ASSESSMENT/PLAN:  Advanced nonexudative age-related macular degeneration of left eye with subfoveal involvement Accounts for acuity OS  Exudative age-related macular degeneration of left eye with active choroidal neovascularization (HCC) Region nasal and superonasal to the FAZ much less active  as compared to February and March 2022 currently at 8-week follow-up post Eylea.  Repeat injection today and examination next OS in 10 weeks     ICD-10-CM   1. Exudative age-related macular degeneration of left eye with active choroidal neovascularization (HCC)  H35.3221 Intravitreal Injection, Pharmacologic Agent - OS - Left Eye    OCT, Retina - OU - Both Eyes    aflibercept (EYLEA) SOLN 2 mg    2. Advanced  nonexudative age-related macular degeneration of left eye with subfoveal involvement  H35.3124       1.  OS, vastly improved macular anatomy and stable acuity currently at 8-week follow-up post Eylea.  Repeat injection today and next in 10 weeks  2.  3.  Ophthalmic Meds Ordered this visit:  Meds ordered this encounter  Medications   aflibercept (EYLEA) SOLN 2 mg       Return in about 10 weeks (around 09/15/2021) for dilate, OS, EYLEA OCT.  There are no Patient Instructions on file for this visit.   Explained the diagnoses, plan, and follow up with the patient and they expressed understanding.  Patient expressed understanding of the importance of proper follow up care.   Alford HighlandGary A. Trestin Vences M.D. Diseases & Surgery of the Retina and Vitreous [facility] 07/07/21     Abbreviations: M myopia (nearsighted); A astigmatism; H hyperopia (farsighted); P presbyopia; Mrx spectacle prescription;  CTL contact lenses; OD right eye; OS left eye; OU both eyes  XT exotropia; ET esotropia; PEK punctate epithelial keratitis; PEE punctate epithelial erosions; DES dry eye syndrome; MGD meibomian gland dysfunction; ATs artificial tears; PFAT's preservative free artificial tears; NSC nuclear sclerotic cataract; PSC posterior subcapsular cataract; ERM epi-retinal membrane; PVD posterior vitreous detachment; RD retinal detachment; DM diabetes mellitus; DR diabetic retinopathy; NPDR non-proliferative diabetic retinopathy; PDR proliferative diabetic retinopathy; CSME clinically significant macular edema; DME diabetic macular edema; dbh dot blot hemorrhages; CWS cotton wool spot; POAG primary open angle glaucoma; C/D cup-to-disc ratio; HVF humphrey visual field; GVF goldmann visual field; OCT optical coherence tomography; IOP intraocular pressure; BRVO Branch retinal vein occlusion; CRVO central retinal vein occlusion; CRAO central retinal artery occlusion; BRAO branch retinal artery occlusion; RT  retinal tear; SB scleral buckle; PPV pars plana vitrectomy; VH Vitreous hemorrhage; PRP panretinal laser photocoagulation; IVK intravitreal kenalog; VMT vitreomacular traction; MH Macular hole;  NVD neovascularization of the disc; NVE neovascularization elsewhere; AREDS age related eye disease study; ARMD age related macular degeneration; POAG primary open angle glaucoma; EBMD epithelial/anterior basement membrane dystrophy; ACIOL anterior chamber intraocular lens; IOL intraocular lens; PCIOL posterior chamber intraocular lens; Phaco/IOL phacoemulsification with intraocular lens placement; PRK photorefractive keratectomy; LASIK laser assisted in situ keratomileusis; HTN hypertension; DM diabetes mellitus; COPD chronic obstructive pulmonary disease

## 2021-07-07 NOTE — Assessment & Plan Note (Signed)
Accounts for acuity OS 

## 2021-07-16 ENCOUNTER — Ambulatory Visit (INDEPENDENT_AMBULATORY_CARE_PROVIDER_SITE_OTHER): Payer: Medicare Other | Admitting: Internal Medicine

## 2021-07-16 DIAGNOSIS — I359 Nonrheumatic aortic valve disorder, unspecified: Secondary | ICD-10-CM

## 2021-07-16 DIAGNOSIS — Z952 Presence of prosthetic heart valve: Secondary | ICD-10-CM

## 2021-07-16 DIAGNOSIS — I6350 Cerebral infarction due to unspecified occlusion or stenosis of unspecified cerebral artery: Secondary | ICD-10-CM | POA: Diagnosis not present

## 2021-07-16 DIAGNOSIS — Z5181 Encounter for therapeutic drug level monitoring: Secondary | ICD-10-CM

## 2021-07-16 LAB — POCT INR: INR: 2 (ref 2.0–3.0)

## 2021-07-16 NOTE — Patient Instructions (Signed)
Description   Take 1 tablet today (5mg )  and then continue 2.5 mg daily except 1 tablet Monday, Wednesday and Friday.  Repeat INR in 2 week.  *PLEASE talk to daughter Monday for dosing instructions* *Home meter, checks every 2 weeks*

## 2021-07-18 ENCOUNTER — Other Ambulatory Visit: Payer: Self-pay | Admitting: Cardiology

## 2021-07-24 ENCOUNTER — Other Ambulatory Visit: Payer: Self-pay | Admitting: Cardiology

## 2021-07-30 ENCOUNTER — Ambulatory Visit (INDEPENDENT_AMBULATORY_CARE_PROVIDER_SITE_OTHER): Payer: Medicare Other | Admitting: Cardiology

## 2021-07-30 DIAGNOSIS — Z7901 Long term (current) use of anticoagulants: Secondary | ICD-10-CM | POA: Diagnosis not present

## 2021-07-30 DIAGNOSIS — Z952 Presence of prosthetic heart valve: Secondary | ICD-10-CM

## 2021-07-30 DIAGNOSIS — Z5181 Encounter for therapeutic drug level monitoring: Secondary | ICD-10-CM

## 2021-07-30 DIAGNOSIS — I359 Nonrheumatic aortic valve disorder, unspecified: Secondary | ICD-10-CM | POA: Diagnosis not present

## 2021-07-30 DIAGNOSIS — I6350 Cerebral infarction due to unspecified occlusion or stenosis of unspecified cerebral artery: Secondary | ICD-10-CM

## 2021-07-30 LAB — POCT INR: INR: 2.5 (ref 2.0–3.0)

## 2021-07-30 NOTE — Patient Instructions (Signed)
Description   Continue 2.5 mg daily except 1 tablet Monday, Wednesday and Friday.  Repeat INR in 2 week.  *PLEASE talk to daughter [name] for dosing instructions* *Home meter, checks every 2 weeks*

## 2021-08-06 ENCOUNTER — Telehealth: Payer: Self-pay

## 2021-08-06 ENCOUNTER — Other Ambulatory Visit: Payer: Medicare Other

## 2021-08-06 ENCOUNTER — Other Ambulatory Visit: Payer: Self-pay

## 2021-08-06 VITALS — BP 110/40 | HR 62 | Temp 98.1°F | Resp 18 | Wt 112.0 lb

## 2021-08-06 DIAGNOSIS — Z515 Encounter for palliative care: Secondary | ICD-10-CM

## 2021-08-06 NOTE — Progress Notes (Signed)
COMMUNITY PALLIATIVE CARE SW NOTE  PATIENT NAME: [name] DOB: [birth year] MRN: [id number]  PRIMARY CARE PROVIDER: Celene Squibb, MD  RESPONSIBLE PARTY:  Acct ID - Guarantor Home Phone Work Phone Relationship Acct Type  [id number] Colleen [phone]  Self P/F     [place], [place], [place] [address]     PLAN OF CARE and INTERVENTIONS:             GOALS OF CARE/ ADVANCE CARE PLANNING:  Patient is a DNR. Patients daughter, [name], is POA. Patient's goal is to remain at home as independent as possible.   2.         SOCIAL/EMOTIONAL/SPIRITUAL ASSESSMENT/ INTERVENTIONS:  SW and RN met with patient and patient's live-in caregiver, [name], in patient's home for routine monthly visit. Patient lives in a one story home. Patient has chronic COPD and respiratory failure.   Patient shares that she is feeling good today. Patient and caregiver updated PC medical condition and changes.  Caregiver and patient shares that she has not been feeling too well this past week. Patient has been having some issues with her stomach and passing blood in her bowel movements, stomach discomfort even with urinating. Patient has been feeling very weak this past week as well and issues of weakness in her legs. Caregiver also shares that patient has been sweating more in the evening this week. No falls reported.    Patients appetite has decreased this week. Caregiver prepares 3 meals daily, however patient has only eaten portions of these meals daily. Fluid intake is still good. Sleeping pattern is the same. Increased melatonin to 27m.   RN checked vitals. No recent medication changes. Patient's caregiver checks Coumadin at home. Most recent reading was 2.5. Caregiver also takes patients vitals and weights daily, weights have been stable, current weight is 111lbs. Patient takes Lasix Qmorning. Patients BP has been running lower than normal and has had headaches, which is abnormal for patient. Recommended  patient/daughter outreach PCP office to make aware of changes over the past week (blood in stool, increased weakness, decrease in appetite, abnormal periods of headaches, rise in body temp/clamminess, lower BP readings, increased stomach discomfort). Daughter updated on findings and recommended PCP f/u visit.   Patient continues to go out daily with caregiver and visit the gym. Patient does not use any AD to ambulate while out but [facility].   SW discussed goals, reviewed care plan, provided emotional support, used active and reflective listening. Palliative care will continue to monitor and assist with long term care planning as needed.   3.         PATIENT/CAREGIVER EDUCATION/ COPING:  Patient continues with active lifestyle of going to the gym and riding the stationary bike, going out to lunch daily and going on leisure outings. Patient A&O x3, Pt is HOH but able to answer questions appropriately. Patient denies any anxiety or depression, patient takes Xanax HS. Patients family is supportive.   4.         PERSONAL EMERGENCY PLAN:  Patient will call 9-1-1 for emergencies.   5.         COMMUNITY RESOURCES COORDINATION/ HEALTH CARE NAVIGATION:  Patient and patients daughter manages her care.   6.         FINANCIAL/LEGAL CONCERNS/INTERVENTIONS:  None.      SOCIAL HX:  Social History   Tobacco Use   Smoking status: Former    Packs/day: 0.50    Types: Cigarettes  Quit date: 11/30/1991    Years since quitting: 29.7   Smokeless tobacco: Never   Tobacco comments:    Tobacco use-no  Substance Use Topics   Alcohol use: No    CODE STATUS: DNR ADVANCED DIRECTIVES: Y MOST FORM COMPLETE:  N HOSPICE EDUCATION PROVIDED: N  [id number] is independent with all ADL's at this time. Caregiver preps meals, Patient is A&O x3 with average insight and judgement.    Time spent: 45 min   [place], [place]

## 2021-08-06 NOTE — Telephone Encounter (Signed)
615 pm.  Incoming call from [place].  Advised of NP recommendations for ED visit.  [name] will talk to patient but does not think she will want to go to the ED for evaluation.  Patient is already established with a GI provider.  Should patient decide against and ED visit daughter will contact GI and provide an update on patient condition to see how to proceed.

## 2021-08-06 NOTE — Progress Notes (Signed)
PATIENT NAME: [name] DOB: 1925-12-24 MRN: [id number]  PRIMARY CARE PROVIDER: Benita Stabile, MD  RESPONSIBLE PARTY:  Acct ID - Guarantor Home Phone Work Phone Relationship Acct Type  [id number] Particia Simpson* [phone]  Self P/F     [name], [place], [place] [address]    PLAN OF CARE and INTERVENTIONS:               1.  GOALS OF CARE/ ADVANCE CARE PLANNING:  Remain home under the assistance of her                2.  PATIENT/CAREGIVER EDUCATION:  S/S of anemia.               4. PERSONAL EMERGENCY PLAN:  activate 911 for emergencies.               5.  DISEASE STATUS:  Patient is reporting decrease in appetite over the last 1-2 weeks. Weight remains stable at 110-112 lbs.  Weights are being checked daily.  Continues with O2 at @ L via [place].  Patient does not report any issues with shortness of breath.  Patient and caregiver report ongoing issues with stomach pain.  Having regular bowel movements but after movement moderate amount of bright red blood is released from rectum.  This has been consistent over the last 2 weeks and prior to this would only occur rarely.  No complaints of constipation and patient typically takes a stool softner but did not require this last evening.   Patient notes she does have a hemorrhoid. Caregiver notes the amount of blood is concerning as this has worsened.   Patient notes even after voiding she has stomach pains.  Unable to give details about pain other than, "it just hurts".  Discussed s/s of anemia.  Encouraged caregiver to contact PCP for follow up given the amount of blood patient is loosing with each bowel movement.   Patient continues to ambulate in the home with out a assistive devices.  She is keeping a cane in the car when she is outside of the home.  No falls are reported.  Ongoing issues with weakness to her legs.  Patient is going to the gym about 3 x a week but mostly for socialization.     Caregiver notes that patient recently started  having clammy/sweating spells in the evening.  No other symptoms occur during this time.  No fever reported by caregiver.   Patient reports occasional issues with insomnia.  She will nap occasionally during the day.   Melatonin was increased to 5 mg over the last month but caregiver states patient still reports has difficulty falling asleep.   No other changes reported with medications other than increase in Melatonin.     PT/INT 2.5 last Thursday.  Caregiver continues check PT/INR and report results to cardiology.     Telephone conversation completed with daughter [name] and update provided.  Advised that I would recommend connecting to PCP given worsening bleeding.  [name] is agreeable for me to contact PCP with update.  Phone call made to PCP while on this visit and message left for Alphonzo Grieve, NP's nurse.  Response pending.    HISTORY OF PRESENT ILLNESS:  87 year old female with CHF, COPD, CAD and HTN.  Patient is being followed by Palliative Care monthly and PRN.   CODE STATUS: DNR ADVANCED DIRECTIVES: No MOST FORM: No PPS: 50%   PHYSICAL EXAM:   VITALS: Today's Vitals  08/06/21 1015  BP: (!) 110/40  Pulse: 62  Resp: 18  Temp: 98.1 F (36.7 C)  SpO2: 98%  Weight: 112 lb (50.8 kg)  PainSc: 0-No pain    LUNGS: clear to auscultation  CARDIAC: Cor RRR}  EXTREMITIES: - for edema SKIN: Skin color, texture, turgor normal. No rashes or lesions or normal and mobility and turgor normal  NEURO: positive for memory issues and headaches that are rare in occurrence.       Truitt Merle, RN

## 2021-08-06 NOTE — Telephone Encounter (Signed)
455 pm.  Incoming call from [place] for [place], NP.  Message was received from earlier today and recommendation is for ED evaluation for possible GI bleed.  Questioned if patient/family did not want ED visit, could office visit done to check blood work.  Mardene Celeste stated it would be best for and ED visit as the office does not have the proper equipment to evaluate for a GI bleed.  Advised that I would notify daughter [name] of recommendation.  546 pm.  Phone call made to daughter [name].  No answer but message has been left.

## 2021-08-13 ENCOUNTER — Ambulatory Visit (INDEPENDENT_AMBULATORY_CARE_PROVIDER_SITE_OTHER): Payer: Medicare Other | Admitting: Cardiology

## 2021-08-13 DIAGNOSIS — Z5181 Encounter for therapeutic drug level monitoring: Secondary | ICD-10-CM | POA: Diagnosis not present

## 2021-08-13 LAB — POCT INR: INR: 2.5 (ref 2.0–3.0)

## 2021-08-13 NOTE — Patient Instructions (Signed)
Description   Called and spoke to pt's daughter instructed pt to Continue taking warfarin 2.5 mg daily except 5mg  Monday, Wednesday and Friday.  Repeat INR in 2 week.  *PLEASE talk to daughter Saturday for dosing instructions* *Home meter, checks every 2 weeks*

## 2021-08-15 ENCOUNTER — Other Ambulatory Visit: Payer: Self-pay | Admitting: Internal Medicine

## 2021-08-28 ENCOUNTER — Ambulatory Visit (INDEPENDENT_AMBULATORY_CARE_PROVIDER_SITE_OTHER): Payer: Medicare Other | Admitting: Internal Medicine

## 2021-08-28 DIAGNOSIS — I359 Nonrheumatic aortic valve disorder, unspecified: Secondary | ICD-10-CM

## 2021-08-28 DIAGNOSIS — Z952 Presence of prosthetic heart valve: Secondary | ICD-10-CM

## 2021-08-28 DIAGNOSIS — Z5181 Encounter for therapeutic drug level monitoring: Secondary | ICD-10-CM | POA: Diagnosis not present

## 2021-08-28 DIAGNOSIS — I6350 Cerebral infarction due to unspecified occlusion or stenosis of unspecified cerebral artery: Secondary | ICD-10-CM

## 2021-08-28 LAB — POCT INR: INR: 2.1 (ref 2.0–3.0)

## 2021-09-03 ENCOUNTER — Other Ambulatory Visit: Payer: Medicare Other

## 2021-09-03 ENCOUNTER — Other Ambulatory Visit: Payer: Self-pay

## 2021-09-03 VITALS — BP 132/50 | HR 71 | Temp 97.5°F | Resp 22 | Wt 113.0 lb

## 2021-09-03 DIAGNOSIS — Z515 Encounter for palliative care: Secondary | ICD-10-CM

## 2021-09-03 NOTE — Progress Notes (Signed)
COMMUNITY PALLIATIVE CARE SW NOTE  PATIENT NAME: [name] DOB: [birth year] MRN: [id number]  PRIMARY CARE PROVIDER: Celene Squibb, MD  RESPONSIBLE PARTY:  Acct ID - Guarantor Home Phone Work Phone Relationship Acct Type  [id number] [name]  Self P/F     [address], [place], [place] [address]     PLAN OF CARE and INTERVENTIONS:             GOALS OF CARE/ ADVANCE CARE PLANNING:  Patient is a DNR. Patients daughter, [name], is POA. Patient's goal is to remain at home as independent as possible.   2.         SOCIAL/EMOTIONAL/SPIRITUAL ASSESSMENT/ INTERVENTIONS:  SW and RN met with patient and patient's live-in caregiver, [name], in patient's home for routine monthly visit. Patient lives in a one story home. Patient has chronic COPD and respiratory failure.   Patient shares that she is feeling good today. Patient and caregiver updated PC medical condition and changes.  Caregiver and patient shares that she has been feeling okay this past few weeks. Patient continues having some issues with her stomach and passing blood in her bowel movements, stomach discomfort even with urinating. Patient declines feeling weak this past week, however continues to have issues of weakness in her legs. No falls reported. Patient presents with some memory deficits, as she could not remember what agency SW and RN came from.    Patients appetite has increases a bit but not much. Caregiver prepares 3 meals daily, however patient has only eaten portions of these meals daily. Fluid intake is still good. Sleeping pattern is the same. Continues taking melatonin to 34m.   RN checked vitals. No recent medication changes. Patient's caregiver checks Coumadin at home. Most recent reading was 2.1. Caregiver also takes patients vitals and weights daily, weights have been stable, current weight is 113lbs. Patient takes Lasix Qmorning. Patients BP has been running normal and denies headaches. Patient scheduled to  see PCP Dr. HElsie Saas10/25.   Patient continues to go out daily with caregiver and visit the gym. Patient does not use any AD to ambulate while out but does take [facility].   SW discussed goals, reviewed care plan, provided emotional support, used active and reflective listening. Palliative care will continue to monitor and assist with long term care planning as needed.   3.         PATIENT/CAREGIVER EDUCATION/ COPING:  Patient continues with active lifestyle of going to the gym and riding the stationary bike, going out to lunch daily and going on leisure outings. Patient A&O x3, Pt is HOH but able to answer questions appropriately. Patient denies any anxiety or depression, patient takes Xanax HS. Patients family is supportive.   4.         PERSONAL EMERGENCY PLAN:  Patient will call 9-1-1 for emergencies.   5.         COMMUNITY RESOURCES COORDINATION/ HEALTH CARE NAVIGATION:  Patient and patients daughter manages her care.   6.         FINANCIAL/LEGAL CONCERNS/INTERVENTIONS:  None.     SOCIAL HX:  Social History   Tobacco Use   Smoking status: Former    Packs/day: 0.50    Types: Cigarettes    Quit date: 11/30/1991    Years since quitting: 29.7   Smokeless tobacco: Never   Tobacco comments:    Tobacco use-no  Substance Use Topics   Alcohol use: No    CODE  STATUS: DNR ADVANCED DIRECTIVES: Y MOST FORM COMPLETE: Y HOSPICE EDUCATION PROVIDED: N  POD:GWZDVIP is independent with all ADL's at this time. Caregiver present 24/7. Patient is A&O x3 with average insight and judgement.    Time spent: 45 min       [place], [place]

## 2021-09-03 NOTE — Progress Notes (Signed)
PATIENT NAME: [name] DOB: 1926-04-10 MRN: [id number]  PRIMARY CARE PROVIDER: Benita Stabile, MD  RESPONSIBLE PARTY:  Acct ID - Guarantor Home Phone Work Phone Relationship Acct Type  [id number] [name]* [phone]  Self P/F     [name], [place], [place] [address]    PLAN OF CARE and INTERVENTIONS:               1.  GOALS OF CARE/ ADVANCE CARE PLANNING:  Remain home under the care of her family and caregiver.  Avoid hospitalizations when possible.                2.  PATIENT/CAREGIVER EDUCATION:  Hand washing and vaccinations.               4. PERSONAL EMERGENCY PLAN:  Activate 911 for emergencies.                5.  DISEASE STATUS:  Joint visit completed with [place], SW, [name]-caregiver and patient.   Patient reports regular bowel movements.  Continues to have a small amount of bleeding but this has decreased from what was reported last month.  Right lower quadrant tenderness is ongoing.  Appetite is is fair with no weight loss noted.  Patient continues to go out almost daily for lunch and caregiver is preparing breakfast and dinner.   No changes in medications since our last visit.  Patient is scheduled to see Dr. Margo Aye next week.  Blood work was obtained yesterday.   Patient reports ongoing issues with insomnia despite the use of melatonin.  She will nap occasionally during the day.   Continues to use a cane for safe ambulation.  No falls reported.  Going to the gym about 3 x week and will occasionally use the bike for lower extremity strengthening.   Patient reports some dizziness when she first rises in the morning but this resolves quickly.  Patient reports taking her time when getting up.   Discussed flu shot and patient will follow up with Dr. Margo Aye regarding injection.  Discussed importance of hand washing and remaining home if sick or not allowing sick visitors into the home.  Patient more forgetful and agitated during the visit.  Spoke with caregiver  [name] after visit was completed.  She notes patient has been more forgetful and more irritable over the last month.  She is also seeing a cognitive decline in patient over the last 1-2 months.      HISTORY OF PRESENT ILLNESS:  87 year old female with COPD and CHF.  Patient is being followed by Palliative Care monthly and PRN.   CODE STATUS: DNR ADVANCED DIRECTIVES: No MOST FORM: No PPS: 50%   PHYSICAL EXAM:   VITALS: Today's Vitals   09/03/21 1004  BP: (!) 132/50  Pulse: 71  Resp: (!) 22  Temp: (!) 97.5 F (36.4 C)  SpO2: 93%  Weight: 113 lb (51.3 kg)  PainSc: 4   PainLoc: Back    LUNGS: decreased breath sounds CARDIAC: Cor RRR}  EXTREMITIES: - for edema SKIN: Skin color, texture, turgor normal. No rashes or lesions or mobility and turgor normal  NEURO: positive for gait problems and memory problems       Truitt Merle, RN

## 2021-09-10 ENCOUNTER — Ambulatory Visit (INDEPENDENT_AMBULATORY_CARE_PROVIDER_SITE_OTHER): Payer: Medicare Other | Admitting: Cardiology

## 2021-09-10 DIAGNOSIS — Z5181 Encounter for therapeutic drug level monitoring: Secondary | ICD-10-CM | POA: Diagnosis not present

## 2021-09-10 DIAGNOSIS — Z952 Presence of prosthetic heart valve: Secondary | ICD-10-CM

## 2021-09-10 DIAGNOSIS — I359 Nonrheumatic aortic valve disorder, unspecified: Secondary | ICD-10-CM

## 2021-09-10 DIAGNOSIS — I6350 Cerebral infarction due to unspecified occlusion or stenosis of unspecified cerebral artery: Secondary | ICD-10-CM

## 2021-09-10 LAB — POCT INR: INR: 1.9 — AB (ref 2.0–3.0)

## 2021-09-15 ENCOUNTER — Other Ambulatory Visit: Payer: Self-pay

## 2021-09-15 ENCOUNTER — Encounter (INDEPENDENT_AMBULATORY_CARE_PROVIDER_SITE_OTHER): Payer: Self-pay | Admitting: Ophthalmology

## 2021-09-15 ENCOUNTER — Ambulatory Visit (INDEPENDENT_AMBULATORY_CARE_PROVIDER_SITE_OTHER): Payer: Medicare Other | Admitting: Ophthalmology

## 2021-09-15 DIAGNOSIS — H353212 Exudative age-related macular degeneration, right eye, with inactive choroidal neovascularization: Secondary | ICD-10-CM

## 2021-09-15 DIAGNOSIS — H353221 Exudative age-related macular degeneration, left eye, with active choroidal neovascularization: Secondary | ICD-10-CM

## 2021-09-15 MED ORDER — AFLIBERCEPT 2MG/0.05ML IZ SOLN FOR KALEIDOSCOPE
2.0000 mg | INTRAVITREAL | Status: AC | PRN
  Administered 2021-09-15: 2 mg via INTRAVITREAL

## 2021-09-15 NOTE — Assessment & Plan Note (Signed)
No sign of active CNVM OD.  Continue to observe diffuse atrophy

## 2021-09-15 NOTE — Assessment & Plan Note (Signed)
OS, much less active disease overall yet still slightly increased intraretinal fluid nasal to FAZ at 10-week interval.  Repeat injection Eylea today and maintain 8-week follow-up in the future

## 2021-09-15 NOTE — Progress Notes (Signed)
09/15/2021     CHIEF COMPLAINT Patient presents for  Chief Complaint  Patient presents with   Retina Follow Up      HISTORY OF PRESENT ILLNESS: [name] is a 87 y.o. female who presents to the clinic today for:   HPI     Retina Follow Up   Patient presents with  Wet AMD.  In both eyes.  This started 10 weeks ago.  Duration of 10 weeks.  Since onset it is stable.        Comments   Pt c/o subconj heme in the left eye following the last injection as well as developing a stye on the LLL following the injection.        Last edited by Edmon Crape, MD on 09/15/2021 11:33 AM.      Referring physician: Benita Stabile, MD [address] [place],  [place] [address]  HISTORICAL INFORMATION:   Selected notes from the MEDICAL RECORD NUMBER    Lab Results  Component Value Date   HGBA1C 5.7 (H) 10/27/2019     CURRENT MEDICATIONS: No current outpatient medications on file. (Ophthalmic Drugs)   No current facility-administered medications for this visit. (Ophthalmic Drugs)   Current Outpatient Medications (Other)  Medication Sig   acetaminophen (TYLENOL) 650 MG CR tablet Take 650 mg by mouth every 8 (eight) hours as needed for pain.   ALPRAZolam (XANAX) 0.25 MG tablet Take 0.125-0.25 mg by mouth at bedtime as needed for anxiety.    ANORO ELLIPTA 62.5-25 MCG/INH AEPB USE 1 INHALATION ONCE DAILY.   atorvastatin (LIPITOR) 40 MG tablet TAKE ONE TABLET BY MOUTH ONCE DAILY.   baclofen (LIORESAL) 10 MG tablet Take 10 mg by mouth as needed for muscle spasms.   docusate sodium (COLACE) 100 MG capsule Take 100 mg by mouth every evening.    donepezil (ARICEPT) 10 MG tablet TAKE ONE TABLET BY MOUTH ONCE DAILY. (Patient taking differently: Take 10 mg by mouth at bedtime.)   ergocalciferol (VITAMIN D2) 50000 UNITS capsule Take 50,000 Units by mouth every Tuesday.   ferrous sulfate 325 (65 FE) MG EC tablet Take 325 mg by mouth daily.    furosemide (LASIX) 40 MG tablet TAKE ONE  TABLET BY MOUTH ONCE DAILY.   HYDROcodone-acetaminophen (NORCO) 7.5-325 MG tablet Take 1 tablet by mouth every 6 (six) hours as needed for moderate pain.   hydrocortisone (ANUSOL-HC) 2.5 % rectal cream Place 1 application rectally 3 (three) times daily as needed for hemorrhoids or anal itching.   levalbuterol (XOPENEX) 0.63 MG/3ML nebulizer solution USE 1 VIAL VIA NEBULIZER EVERY 8 HOURS AS NEEDED.   meclizine (ANTIVERT) 25 MG tablet Take 25 mg by mouth 3 (three) times daily as needed for dizziness.   Melatonin 3 MG TABS Take 1 tablet by mouth at bedtime.   methylPREDNISolone (MEDROL) 4 MG tablet Take 4 mg by mouth 2 (two) times daily as needed.   multivitamin-lutein (OCUVITE-LUTEIN) CAPS capsule Take 1 capsule by mouth daily.   ondansetron (ZOFRAN) 4 MG tablet Take 4 mg by mouth every 8 (eight) hours as needed for nausea or vomiting.   polyethylene glycol (MIRALAX / GLYCOLAX) 17 g packet Take 17 g by mouth daily as needed for moderate constipation.   pregabalin (LYRICA) 75 MG capsule TAKE 1 CAPSULE BY MOUTH EVERY OTHER DAY.   spironolactone (ALDACTONE) 25 MG tablet TAKE (1/2) TABLET BY MOUTH DAILY.   traZODone (DESYREL) 50 MG tablet Take 0.5-1 tablets (25-50 mg total) by  mouth at bedtime as needed for sleep.   warfarin (COUMADIN) 5 MG tablet TAKE 1/2 TO 1 TABLET ONCE DAILY AS DIRECTED BY COUMADIN CLINIC.   zinc sulfate 50 MG CAPS capsule Take 50 mg by mouth 2 (two) times daily.    No current facility-administered medications for this visit. (Other)      REVIEW OF SYSTEMS:    ALLERGIES Allergies  Allergen Reactions   Codeine Nausea And Vomiting   Penicillins Other (See Comments)    Unknown reaction - listed on MAR Has patient had a PCN reaction causing immediate rash, facial/tongue/throat swelling, SOB or lightheadedness with hypotension: Unknown Has patient had a PCN reaction causing severe rash involving mucus membranes or skin necrosis: Unknown Has patient had a PCN reaction that  required hospitalization: Unknown Has patient had a PCN reaction occurring within the last 10 years: Unknown If all of the above answers are "NO", then may proceed with Cephalosporin use.    PAST MEDICAL HISTORY Past Medical History:  Diagnosis Date   Anemia    Anxiety    Arthritis    Blood transfusion without reported diagnosis    Carotid artery stenosis    Without infarction   Cataract    Chronic combined systolic (congestive) and diastolic (congestive) heart failure (HCC)    COPD (chronic obstructive pulmonary disease) ([place])    Wears 02 at home 2 L   Coronary artery disease    Heart valve replaced by other means    Hypercholesterolemia    Pure   Hypertension    Unspecified   LBBB (left bundle branch block)    Macular degeneration (senile) of retina, unspecified    On home O2    Postsurgical aortocoronary bypass status    Stroke ([place])    Transient global amnesia    Unspecified hereditary and idiopathic peripheral neuropathy    Unspecified vitamin D deficiency    Past Surgical History:  Procedure Laterality Date   AORTIC VALVE REPLACEMENT     APPENDECTOMY     CORONARY ARTERY BYPASS GRAFT     ERCP N/A 09/13/2020   Procedure: ENDOSCOPIC RETROGRADE CHOLANGIOPANCREATOGRAPHY (ERCP);  Surgeon: Milus Banister, MD;  Location[facility] ENDOSCOPY;  Service: Endoscopy;  Laterality: N/A;   HIP PINNING,CANNULATED Right 07/18/2018   Procedure: RIGHT CANNULATED HIP PINNING;  Surgeon: Altamese Woodburn, MD;  Location: [facility];  Service: Orthopedics;  Laterality: Right;   REMOVAL OF STONES  09/13/2020   Procedure: REMOVAL OF STONES;  Surgeon: Milus Banister, MD;  Location: [facility];  Service: Endoscopy;;   SPHINCTEROTOMY  09/13/2020   Procedure: Joan Mayans;  Surgeon: Milus Banister, MD;  Location: [facility] ENDOSCOPY;  Service: Endoscopy;;   TONSILLECTOMY      FAMILY HISTORY Family History  Problem Relation Age of Onset   Heart attack Father    Heart disease Father    Stroke Father     Stroke Sister    Hypertension Mother    Stroke Mother    Neuropathy Brother    COPD Sister     SOCIAL HISTORY Social History   Tobacco Use   Smoking status: Former    Packs/day: 0.50    Types: Cigarettes    Quit date: 11/30/1991    Years since quitting: 29.8   Smokeless tobacco: Never   Tobacco comments:    Tobacco use-no  Substance Use Topics   Alcohol use: No   Drug use: No         OPHTHALMIC EXAM:  Base Eye Exam  Visual Acuity (ETDRS)       Right Left   Dist cc 20/80 -2 20/80 -2   Dist ph cc 20/60 -1 NI    Correction: Glasses         Tonometry (Tonopen, 11:13 AM)       Right Left   Pressure 10 12         Pupils       Pupils Dark Light Shape React APD   Right PERRL 1.5 1 Round Minimal None   Left PERRL 1.5 1 Round Minimal None         Visual Fields (Counting fingers)       Left Right    Full Full         Extraocular Movement       Right Left    Full, Ortho Full, Ortho         Neuro/Psych     Oriented x3: Yes   Mood/Affect: Normal         Dilation     Left eye: 1.0% Mydriacyl, 2.5% Phenylephrine @ 11:13 AM           Slit Lamp and Fundus Exam     External Exam       Right Left   External Normal Normal         Slit Lamp Exam       Right Left   Lids/Lashes Normal Normal   Conjunctiva/Sclera White and quiet White and quiet   Cornea Clear Clear   Anterior Chamber Deep and quiet Deep and quiet   Iris Round and reactive,pinhole Round and reactive   Lens Clear Centered posterior chamber intraocular lens   Anterior Vitreous Normal Normal         Fundus Exam       Right Left   Posterior Vitreous Posterior vitreous detachment Posterior vitreous detachment, Central vitreous floaters   Disc Normal Normal, collateral vessels on the nerve   C/D Ratio 0.2 0.35   Macula Hard drusen, Atrophy, no macular thickening, no exudates, no cystoid macular edema,  No Subretinal hemorrhage, no macular thickening, Soft  drusen, Geographic atrophy   Vessels Normal, occlusion. Normal   Periphery Normal Normal            IMAGING AND PROCEDURES  Imaging and Procedures for 09/15/21  OCT, Retina - OU - Both Eyes       Right Eye Quality was good. Scan locations included subfoveal. Central Foveal Thickness: 236. Progression has been stable. Findings include abnormal foveal contour, retinal drusen .   Left Eye Quality was good. Scan locations included subfoveal. Central Foveal Thickness: 297. Progression has been stable. Findings include abnormal foveal contour, retinal drusen .   Notes OD, stable overall  OS with much improved perifoveal wet AMD overall post Eylea, 8-week follow-up in the past, slightly worse at 10-week interval, much improved since Feb, 2022, repeat injection today and decrease exam interval to 8     Intravitreal Injection, Pharmacologic Agent - OS - Left Eye       Time Out 09/15/2021. 11:35 AM. Confirmed correct patient, procedure, site, and patient consented.   Anesthesia Topical anesthesia was used. Anesthetic medications included Akten 3.5%.   Procedure Preparation included 5% betadine to ocular surface, 10% betadine to eyelids, Tobramycin 0.3%. A 30 gauge needle was used.   Injection: 2 mg aflibercept 2 MG/0.05ML   Route: Intravitreal, Site: Left Eye   NDC: [id number], Lot: [id number], Waste: 0 mL   Post-op Post  injection exam found visual acuity of at least counting fingers. The patient tolerated the procedure well. There were no complications. The patient received written and verbal post procedure care education. Post injection medications included ocuflox.              ASSESSMENT/PLAN:  Exudative age-related macular degeneration of right eye with inactive choroidal neovascularization (HCC) No sign of active CNVM OD.  Continue to observe diffuse atrophy  Exudative age-related macular degeneration of left eye with active choroidal neovascularization  (HCC) OS, much less active disease overall yet still slightly increased intraretinal fluid nasal to FAZ at 10-week interval.  Repeat injection Eylea today and maintain 8-week follow-up in the future     ICD-10-CM   1. Exudative age-related macular degeneration of left eye with active choroidal neovascularization (HCC)  H35.3221 OCT, Retina - OU - Both Eyes    Intravitreal Injection, Pharmacologic Agent - OS - Left Eye    aflibercept (EYLEA) SOLN 2 mg    2. Exudative age-related macular degeneration of right eye with inactive choroidal neovascularization ([place])  H35.3212       1.  OS slightly increased intraretinal fluid and thickening at 10-week follow-up post Eylea.  We will repeat injection today and injection of Eylea next in 8 weeks  2.  3.  Ophthalmic Meds Ordered this visit:  Meds ordered this encounter  Medications   aflibercept (EYLEA) SOLN 2 mg       Return in about 8 weeks (around 11/10/2021) for DILATE OU, EYLEA OCT, OS.  There are no Patient Instructions on file for this visit.   Explained the diagnoses, plan, and follow up with the patient and they expressed understanding.  Patient expressed understanding of the importance of proper follow up care.   Clent Demark Lorian Yaun M.D. Diseases & Surgery of the Retina and Vitreous [facility] [place] 09/15/21     Abbreviations: M myopia (nearsighted); A astigmatism; H hyperopia (farsighted); P presbyopia; Mrx spectacle prescription;  CTL contact lenses; OD right eye; OS left eye; OU both eyes  XT exotropia; ET esotropia; PEK punctate epithelial keratitis; PEE punctate epithelial erosions; DES dry eye syndrome; MGD meibomian gland dysfunction; ATs artificial tears; PFAT's preservative free artificial tears; [place] nuclear sclerotic cataract; PSC posterior subcapsular cataract; ERM epi-retinal membrane; PVD posterior vitreous detachment; RD retinal detachment; DM diabetes mellitus; DR diabetic retinopathy; NPDR  non-proliferative diabetic retinopathy; PDR proliferative diabetic retinopathy; CSME clinically significant macular edema; DME diabetic macular edema; dbh dot blot hemorrhages; CWS cotton wool spot; POAG primary open angle glaucoma; C/D cup-to-disc ratio; HVF humphrey visual field; GVF goldmann visual field; OCT optical coherence tomography; IOP intraocular pressure; BRVO Branch retinal vein occlusion; CRVO central retinal vein occlusion; CRAO central retinal artery occlusion; BRAO branch retinal artery occlusion; RT retinal tear; SB scleral buckle; PPV pars plana vitrectomy; VH Vitreous hemorrhage; PRP panretinal laser photocoagulation; IVK intravitreal kenalog; VMT vitreomacular traction; MH Macular hole;  NVD neovascularization of the disc; NVE neovascularization elsewhere; AREDS age related eye disease study; ARMD age related macular degeneration; POAG primary open angle glaucoma; EBMD epithelial/anterior basement membrane dystrophy; ACIOL anterior chamber intraocular lens; IOL intraocular lens; PCIOL posterior chamber intraocular lens; Phaco/IOL phacoemulsification with intraocular lens placement; [place] photorefractive keratectomy; LASIK laser assisted in situ keratomileusis; HTN hypertension; DM diabetes mellitus; COPD chronic obstructive pulmonary disease

## 2021-09-24 ENCOUNTER — Other Ambulatory Visit: Payer: Self-pay

## 2021-09-24 ENCOUNTER — Other Ambulatory Visit: Payer: Medicare Other

## 2021-09-24 ENCOUNTER — Ambulatory Visit (INDEPENDENT_AMBULATORY_CARE_PROVIDER_SITE_OTHER): Payer: Medicare Other | Admitting: Cardiovascular Disease

## 2021-09-24 DIAGNOSIS — I359 Nonrheumatic aortic valve disorder, unspecified: Secondary | ICD-10-CM | POA: Diagnosis not present

## 2021-09-24 DIAGNOSIS — Z5181 Encounter for therapeutic drug level monitoring: Secondary | ICD-10-CM

## 2021-09-24 DIAGNOSIS — Z952 Presence of prosthetic heart valve: Secondary | ICD-10-CM

## 2021-09-24 DIAGNOSIS — I6350 Cerebral infarction due to unspecified occlusion or stenosis of unspecified cerebral artery: Secondary | ICD-10-CM

## 2021-09-24 DIAGNOSIS — Z515 Encounter for palliative care: Secondary | ICD-10-CM

## 2021-09-24 LAB — POCT INR: INR: 2 (ref 2.0–3.0)

## 2021-09-24 NOTE — Progress Notes (Signed)
COMMUNITY PALLIATIVE CARE SW NOTE  PATIENT NAME: [name] DOB: [birth year] MRN: [id number]  PRIMARY CARE PROVIDER: Celene Squibb, MD  RESPONSIBLE PARTY:  Acct ID - Guarantor Home Phone Work Phone Relationship Acct Type  [id number] KHALI, [phone]  Self P/F     [place], [place], [place] [address]     PLAN OF CARE and INTERVENTIONS:             GOALS OF CARE/ ADVANCE CARE PLANNING:  Patient is a DNR. Patients daughter, [name], is POA. Patient's goal is to remain at home as independent as possible.   2.         SOCIAL/EMOTIONAL/SPIRITUAL ASSESSMENT/ INTERVENTIONS:  SW met with patient and patient's live-in caregiver, [name], in patient's home for routine monthly visit. Patient lives in a one story home. Patient has chronic COPD and respiratory failure.   Patient shares that she is feeling good today. Patient and caregiver updated PC medical condition and changes.  Patient recently saw PCP and received flu and PNA shot. Patient declines feeling weak this past week, however continues to have issues of weakness in her legs. No falls reported. Patient presents with some memory deficits. Caregiver express concern of patients steady cognitive decline and weakness.   Patients appetite is the same. Fluid intake is still good. Sleeping pattern is the same. Continues taking melatonin to 81m.   Most recent reading was 2.0. Caregiver also takes patients vitals and weights daily, weights have been stable, current weight is 113lbs. Patient takes Lasix Qmorning. Patients BP has been running normal and denies headaches. Patient denies is abnormal signs of SHOB and denies chest pains.  Patient is having regular bowel movements. Share that blood in stool has decreased.    Patient continues to go out daily with caregiver and visit the gym. Patient does not use any AD to ambulate while out but [facility].  No psychosocial needs identified. Patients family will be in town for  thanksgiving and will gather at her home.   SW discussed goals, reviewed care plan, provided emotional support, used active and reflective listening. Palliative care will continue to monitor and assist with long term care planning as needed.   3.         PATIENT/CAREGIVER EDUCATION/ COPING:  Patient continues with active lifestyle of going to the gym and riding the stationary bike, going out to lunch daily and going on leisure outings. Patient A&O x3, Pt is HOH but able to answer questions appropriately. Patient denies any anxiety or depression, patient takes Xanax HS. Patients family is supportive.   4.         PERSONAL EMERGENCY PLAN:  Patient will call 9-1-1 for emergencies.   5.         COMMUNITY RESOURCES COORDINATION/ HEALTH CARE NAVIGATION:  Patient and patients daughter manages her care.   6.         FINANCIAL/LEGAL CONCERNS/INTERVENTIONS:  None.     SOCIAL HX:  Social History   Tobacco Use   Smoking status: Former    Packs/day: 0.50    Types: Cigarettes    Quit date: 11/30/1991    Years since quitting: 29.8   Smokeless tobacco: Never   Tobacco comments:    Tobacco use-no  Substance Use Topics   Alcohol use: No    CODE STATUS: DNR ADVANCED DIRECTIVES: Y MOST FORM COMPLETE:  N HOSPICE EDUCATION PROVIDED: N  PPS: Patient is independent with all ADL's at this  time. Caregiver present 24/7. Patient is A&O x3 with average insight and judgement.   Time spent: 30 min   [place], [place]

## 2021-10-06 ENCOUNTER — Ambulatory Visit (INDEPENDENT_AMBULATORY_CARE_PROVIDER_SITE_OTHER): Payer: Medicare Other | Admitting: Orthopaedic Surgery

## 2021-10-06 ENCOUNTER — Ambulatory Visit: Payer: Medicare Other

## 2021-10-06 ENCOUNTER — Other Ambulatory Visit: Payer: Self-pay

## 2021-10-06 ENCOUNTER — Telehealth: Payer: Self-pay

## 2021-10-06 ENCOUNTER — Ambulatory Visit (INDEPENDENT_AMBULATORY_CARE_PROVIDER_SITE_OTHER): Payer: Medicare Other | Admitting: Internal Medicine

## 2021-10-06 ENCOUNTER — Encounter: Payer: Self-pay | Admitting: Orthopaedic Surgery

## 2021-10-06 VITALS — BP 143/53 | HR 75 | Ht 62.5 in | Wt 113.0 lb

## 2021-10-06 DIAGNOSIS — M545 Low back pain, unspecified: Secondary | ICD-10-CM

## 2021-10-06 DIAGNOSIS — I6350 Cerebral infarction due to unspecified occlusion or stenosis of unspecified cerebral artery: Secondary | ICD-10-CM

## 2021-10-06 DIAGNOSIS — Z5181 Encounter for therapeutic drug level monitoring: Secondary | ICD-10-CM

## 2021-10-06 DIAGNOSIS — G8929 Other chronic pain: Secondary | ICD-10-CM

## 2021-10-06 DIAGNOSIS — G629 Polyneuropathy, unspecified: Secondary | ICD-10-CM

## 2021-10-06 DIAGNOSIS — M25571 Pain in right ankle and joints of right foot: Secondary | ICD-10-CM

## 2021-10-06 DIAGNOSIS — M792 Neuralgia and neuritis, unspecified: Secondary | ICD-10-CM | POA: Diagnosis not present

## 2021-10-06 LAB — POCT INR: INR: 2.1 (ref 2.0–3.0)

## 2021-10-06 NOTE — Telephone Encounter (Signed)
Return call to [name]: Per [name], patient fell recently, but was seen at ortho MD yesterday and had xrays of back. No chest pain/back pain today, but had a large BM and patient has hx of gallstones.  No other accompanying symptoms.  Patient declines need for visit or follow up this week. Educated on urgent needs to go to ER including chest pain unrelieved, nausea, SOB, sweating syncope etc.  [name] was grateful for the call and will let us know if any further complaints/ needs. Palliative team updated

## 2021-10-06 NOTE — Patient Instructions (Signed)
Description   Called and spoke to pt's daughter instructed for pt to START taking warfarin 1 tablet daily except for 1/2 a tablet on Monday, Wednesday and Friday. Recheck INR in 2 weeks.   *PLEASE talk to daughter [name] for dosing instructions* *Home meter, checks every 2 weeks*

## 2021-10-06 NOTE — Progress Notes (Signed)
I fell  She got caught up on her oxygen tubing at home and fell in front of her chair about two weeks ago.  She sat down hard on the floor. She had some pain for a few days but is not having pain now.  Her daughters wanted her "checked out" today.  She has peripheral neuropathy.  Dr. Dwana Melena is giving her the Lyrica now.  She has a sore on the right lateral ankle over the lateral tip of the lateral malleolus.  They are using a band aide dressing.  Thre is no trauma.  Spine/Pelvis examination:  Inspection:  Overall, sacoiliac joint benign and hips nontender; without crepitus or defects.   Thoracic spine inspection: Alignment normal without kyphosis present   Lumbar spine inspection:  Alignment  with normal lumbar lordosis, without scoliosis apparent.   Thoracic spine palpation:  without tenderness of spinal processes   Lumbar spine palpation: without tenderness of lumbar area; without tightness of lumbar muscles    Range of Motion:   Lumbar flexion, forward flexion is normal without pain or tenderness    Lumbar extension is full without pain or tenderness   Left lateral bend is normal without pain or tenderness   Right lateral bend is normal without pain or tenderness   Straight leg raising is normal  Strength & tone: normal   Stability overall normal stability  The right ankle has a small spot of irration but no ulcer, no redness, no discharge.  Her skin is very dry.  X-rays were done of the lumbar spine and the right ankle, reported separately.  Encounter Diagnoses  Name Primary?   Lumbar pain Yes   Chronic pain of right ankle    Peripheral neuropathy due to inflammation    I have told her the X-rays are negative.  I have advised Cerave ointment or cream to the foot.  Use a large corn pad also.  She can use a band aid also.  I will see her in a year for her yearly check.  Call if any problem.  Precautions discussed.  Electronically Signed Darreld Mclean,  MD 11/22/202211:05 AM

## 2021-10-20 ENCOUNTER — Ambulatory Visit (INDEPENDENT_AMBULATORY_CARE_PROVIDER_SITE_OTHER): Payer: Medicare Other | Admitting: Internal Medicine

## 2021-10-20 DIAGNOSIS — Z5181 Encounter for therapeutic drug level monitoring: Secondary | ICD-10-CM | POA: Diagnosis not present

## 2021-10-20 LAB — POCT INR: INR: 3.1 — AB (ref 2.0–3.0)

## 2021-10-20 NOTE — Patient Instructions (Signed)
Description   Called and spoke to pt's daughter instructed for pt to take 1/2 a tablet of warfarin today and then continue to take warfarin 1 tablet daily except for 1/2 a tablet on Monday, Wednesday and Friday. Recheck INR in 2 weeks.   *PLEASE talk to daughter [name] for dosing instructions* *Home meter, checks every 2 weeks*

## 2021-10-22 ENCOUNTER — Other Ambulatory Visit: Payer: Self-pay | Admitting: Cardiology

## 2021-10-29 ENCOUNTER — Other Ambulatory Visit: Payer: Medicare Other

## 2021-10-29 ENCOUNTER — Other Ambulatory Visit: Payer: Self-pay

## 2021-10-29 ENCOUNTER — Telehealth: Payer: Self-pay

## 2021-10-29 NOTE — Telephone Encounter (Signed)
PC SW outreached patient to inquire about missed in home visit today at 0900. Patients caregiver shard that she had PC visit scheduled for 1000 instead of 0900.  PC visit rescheduled for 11/04/21 @1PM .

## 2021-10-30 ENCOUNTER — Other Ambulatory Visit: Payer: Self-pay | Admitting: Cardiology

## 2021-11-03 ENCOUNTER — Ambulatory Visit (INDEPENDENT_AMBULATORY_CARE_PROVIDER_SITE_OTHER): Payer: Medicare Other | Admitting: Cardiovascular Disease

## 2021-11-03 DIAGNOSIS — Z5181 Encounter for therapeutic drug level monitoring: Secondary | ICD-10-CM | POA: Diagnosis not present

## 2021-11-03 LAB — POCT INR: INR: 3.9 — AB (ref 2.0–3.0)

## 2021-11-03 NOTE — Patient Instructions (Signed)
Description   Called and spoke to pt's daughter instructed for pt to Hold warfarin today and  then continue to take warfarin 1 tablet daily except for 1/2 a tablet on Monday, Wednesday and Friday. Recheck INR in 1.5 weeks.   *PLEASE talk to daughter [name] for dosing instructions* *Home meter, checks every 2 weeks*

## 2021-11-04 ENCOUNTER — Other Ambulatory Visit: Payer: Medicare Other

## 2021-11-04 ENCOUNTER — Other Ambulatory Visit: Payer: Self-pay

## 2021-11-04 VITALS — BP 100/44 | HR 79 | Temp 97.6°F | Resp 18 | Wt 113.0 lb

## 2021-11-04 DIAGNOSIS — Z515 Encounter for palliative care: Secondary | ICD-10-CM

## 2021-11-04 NOTE — Progress Notes (Signed)
PATIENT NAME: [name] DOB: [birth year] MRN: [id number]  PRIMARY CARE PROVIDER: Benita Stabile, MD  RESPONSIBLE PARTY:  Acct ID - Guarantor Home Phone Work Phone Relationship Acct Type  [id number] [name], [name]* [phone]  Self P/F     [address], [place], [place] [address]    PLAN OF CARE and INTERVENTIONS:               1.  GOALS OF CARE/ ADVANCE CARE PLANNING:  Remain home with a private sitter and assistance of her daughter.                2.  PATIENT/CAREGIVER EDUCATION:  Safety               4. PERSONAL EMERGENCY PLAN:  Activate 911 for emergencies.                5.  DISEASE STATUS:   Joint visit completed with patient and caregiver [name].  Rectal Bleeding:  Patient continues to have periodic bleeding after bowel movements.  No constipation noted from patient or caregiver.  Patient does not wish to have any type of work up.  Respiratory: Patient continues with O2 4 L via .  She reports shortness of breath when she exerts herself.  Occasionally with sit in her chair without O2 in place.  Patient feels her respiratory status has remained stable over the last month.  Mobility:  Patient sustained a fall in November. No falls reported since last Palliative Care visit.   She continues to use a walker or cane for safe mobility. Patient endorses using her caregivers arm for stability when she is out in the community.    Cardiac:  PT/INR is checked at home by caregiver and results are called into cardiology.  No concerns voiced by caregiver or patient.  Patient denies issues with dizziness, chest pain or worsening shortness of breath.  No edema present to extremities.    HISTORY OF PRESENT ILLNESS:  87 year old female with COPD and CHF.  Patient is followed by Palliative Care monthly and PRN.   CODE STATUS: DNR ADVANCED DIRECTIVES: No MOST FORM: No PPS: 50%   PHYSICAL EXAM:   VITALS: Today's Vitals   11/04/21 1302  BP: (!) 100/44  Pulse: 79  Resp: 18  Temp: 97.6 F  (36.4 C)  SpO2: 93%  Weight: 113 lb (51.3 kg)    LUNGS: clear to auscultation  CARDIAC: Cor irreg, irreg RRR}  EXTREMITIES: - for edema SKIN: Skin color, texture, turgor normal. No rashes or lesions or mobility and turgor normal  NEURO: positive for gait problems and memory problems       Truitt Merle, RN

## 2021-11-10 ENCOUNTER — Encounter (INDEPENDENT_AMBULATORY_CARE_PROVIDER_SITE_OTHER): Payer: Medicare Other | Admitting: Ophthalmology

## 2021-11-12 ENCOUNTER — Ambulatory Visit (INDEPENDENT_AMBULATORY_CARE_PROVIDER_SITE_OTHER): Payer: Medicare Other | Admitting: Cardiology

## 2021-11-12 DIAGNOSIS — Z5181 Encounter for therapeutic drug level monitoring: Secondary | ICD-10-CM

## 2021-11-12 LAB — POCT INR: INR: 3.2 — AB (ref 2.0–3.0)

## 2021-11-12 NOTE — Patient Instructions (Addendum)
Description   Called and spoke to pt' daughter and instructed for pt to START taking Warfarin 1 tablet Monday, Wednesday and Fridays and 1/2 a tablet all the other days. Recheck INR in 1.5 weeks.   *PLEASE talk to daughter [name] for dosing instructions* *Home meter, checks every 2 weeks*

## 2021-11-18 ENCOUNTER — Other Ambulatory Visit: Payer: Self-pay

## 2021-11-18 ENCOUNTER — Encounter (INDEPENDENT_AMBULATORY_CARE_PROVIDER_SITE_OTHER): Payer: Self-pay | Admitting: Ophthalmology

## 2021-11-18 ENCOUNTER — Ambulatory Visit (INDEPENDENT_AMBULATORY_CARE_PROVIDER_SITE_OTHER): Payer: Medicare Other | Admitting: Ophthalmology

## 2021-11-18 DIAGNOSIS — H353212 Exudative age-related macular degeneration, right eye, with inactive choroidal neovascularization: Secondary | ICD-10-CM

## 2021-11-18 DIAGNOSIS — H353221 Exudative age-related macular degeneration, left eye, with active choroidal neovascularization: Secondary | ICD-10-CM | POA: Diagnosis not present

## 2021-11-18 DIAGNOSIS — H353124 Nonexudative age-related macular degeneration, left eye, advanced atrophic with subfoveal involvement: Secondary | ICD-10-CM | POA: Diagnosis not present

## 2021-11-18 DIAGNOSIS — H353113 Nonexudative age-related macular degeneration, right eye, advanced atrophic without subfoveal involvement: Secondary | ICD-10-CM

## 2021-11-18 MED ORDER — AFLIBERCEPT 2MG/0.05ML IZ SOLN FOR KALEIDOSCOPE
2.0000 mg | INTRAVITREAL | Status: AC | PRN
  Administered 2021-11-18: 2 mg via INTRAVITREAL

## 2021-11-18 NOTE — Assessment & Plan Note (Signed)
Progressive into the FAZ

## 2021-11-18 NOTE — Assessment & Plan Note (Signed)
Stable today at 9-week follow-up.  On Eylea.  Less intraretinal fluid.  Repeat injection intravitreal Eylea today to maintain.  Follow-up again in 9 weeks

## 2021-11-18 NOTE — Assessment & Plan Note (Signed)
Accounts for acuity 

## 2021-11-18 NOTE — Progress Notes (Signed)
11/18/2021     CHIEF COMPLAINT Patient presents for  Chief Complaint  Patient presents with   Retina Follow Up      HISTORY OF PRESENT ILLNESS: [name] is a 88 y.o. female who presents to the clinic today for:   HPI     Retina Follow Up           Diagnosis: Wet AMD   Laterality: left eye   Onset: 9 weeks ago   Severity: mild   Duration: 9 weeks   Course: stable         Comments   9 week fu ou and oct and Eylea OS, with stable or improved acuity per patient report  Pt states VA OU stable since last visit. Pt denies FOL, floaters, or ocular pain OU.        Last edited by Edmon Crape, MD on 11/18/2021 10:53 AM.      Referring physician: Benita Stabile, MD [address] [place],  [place] [address]  HISTORICAL INFORMATION:   Selected notes from the MEDICAL RECORD NUMBER    Lab Results  Component Value Date   HGBA1C 5.7 (H) 10/27/2019     CURRENT MEDICATIONS: No current outpatient medications on file. (Ophthalmic Drugs)   No current facility-administered medications for this visit. (Ophthalmic Drugs)   Current Outpatient Medications (Other)  Medication Sig   atorvastatin (LIPITOR) 40 MG tablet TAKE ONE TABLET BY MOUTH ONCE DAILY.   acetaminophen (TYLENOL) 650 MG CR tablet Take 650 mg by mouth every 8 (eight) hours as needed for pain.   ALPRAZolam (XANAX) 0.25 MG tablet Take 0.125-0.25 mg by mouth at bedtime as needed for anxiety.    ANORO ELLIPTA 62.5-25 MCG/INH AEPB USE 1 INHALATION ONCE DAILY.   baclofen (LIORESAL) 10 MG tablet Take 10 mg by mouth as needed for muscle spasms.   docusate sodium (COLACE) 100 MG capsule Take 100 mg by mouth every evening.    donepezil (ARICEPT) 10 MG tablet TAKE ONE TABLET BY MOUTH ONCE DAILY. (Patient taking differently: Take 10 mg by mouth at bedtime.)   ergocalciferol (VITAMIN D2) 50000 UNITS capsule Take 50,000 Units by mouth every Tuesday.   ferrous sulfate 325 (65 FE) MG EC tablet Take 325 mg by mouth  daily.    furosemide (LASIX) 40 MG tablet TAKE ONE TABLET BY MOUTH ONCE DAILY.   HYDROcodone-acetaminophen (NORCO) 7.5-325 MG tablet Take 1 tablet by mouth every 6 (six) hours as needed for moderate pain.   hydrocortisone (ANUSOL-HC) 2.5 % rectal cream Place 1 application rectally 3 (three) times daily as needed for hemorrhoids or anal itching.   levalbuterol (XOPENEX) 0.63 MG/3ML nebulizer solution USE 1 VIAL VIA NEBULIZER EVERY 8 HOURS AS NEEDED.   meclizine (ANTIVERT) 25 MG tablet Take 25 mg by mouth 3 (three) times daily as needed for dizziness.   Melatonin 3 MG TABS Take 1 tablet by mouth at bedtime.   methylPREDNISolone (MEDROL) 4 MG tablet Take 4 mg by mouth 2 (two) times daily as needed.   multivitamin-lutein (OCUVITE-LUTEIN) CAPS capsule Take 1 capsule by mouth daily.   ondansetron (ZOFRAN) 4 MG tablet Take 4 mg by mouth every 8 (eight) hours as needed for nausea or vomiting.   polyethylene glycol (MIRALAX / GLYCOLAX) 17 g packet Take 17 g by mouth daily as needed for moderate constipation.   pregabalin (LYRICA) 75 MG capsule TAKE 1 CAPSULE BY MOUTH EVERY OTHER DAY.   spironolactone (ALDACTONE) 25 MG tablet TAKE (  1/2) TABLET BY MOUTH DAILY.   traZODone (DESYREL) 50 MG tablet Take 0.5-1 tablets (25-50 mg total) by mouth at bedtime as needed for sleep.   warfarin (COUMADIN) 5 MG tablet TAKE 1/2 TO 1 TABLET ONCE DAILY AS DIRECTED BY COUMADIN CLINIC.   zinc sulfate 50 MG CAPS capsule Take 50 mg by mouth 2 (two) times daily.    No current facility-administered medications for this visit. (Other)      REVIEW OF SYSTEMS:    ALLERGIES Allergies  Allergen Reactions   Codeine Nausea And Vomiting   Penicillins Other (See Comments)    Unknown reaction - listed on MAR Has patient had a PCN reaction causing immediate rash, facial/tongue/throat swelling, SOB or lightheadedness with hypotension: Unknown Has patient had a PCN reaction causing severe rash involving mucus membranes or skin  necrosis: Unknown Has patient had a PCN reaction that required hospitalization: Unknown Has patient had a PCN reaction occurring within the last 10 years: Unknown If all of the above answers are "NO", then may proceed with Cephalosporin use.    PAST MEDICAL HISTORY Past Medical History:  Diagnosis Date   Anemia    Anxiety    Arthritis    Blood transfusion without reported diagnosis    Carotid artery stenosis    Without infarction   Cataract    Chronic combined systolic (congestive) and diastolic (congestive) heart failure (HCC)    COPD (chronic obstructive pulmonary disease) (HCC)    Wears 02 at home 2 L   Coronary artery disease    Heart valve replaced by other means    Hypercholesterolemia    Pure   Hypertension    Unspecified   LBBB (left bundle branch block)    Macular degeneration (senile) of retina, unspecified    On home O2    Postsurgical aortocoronary bypass status    Stroke (HCC)    Transient global amnesia    Unspecified hereditary and idiopathic peripheral neuropathy    Unspecified vitamin D deficiency    Past Surgical History:  Procedure Laterality Date   AORTIC VALVE REPLACEMENT     APPENDECTOMY     CORONARY ARTERY BYPASS GRAFT     ERCP N/A 09/13/2020   Procedure: ENDOSCOPIC RETROGRADE CHOLANGIOPANCREATOGRAPHY (ERCP);  Surgeon: Rachael FeeJacobs, Daniel P, MD;  [facility];  Service: Endoscopy;  Laterality: N/A;   HIP PINNING,CANNULATED Right 07/18/2018   Procedure: RIGHT CANNULATED HIP PINNING;  Surgeon: Myrene GalasHandy, Michael, MD;  Location: [facility] OR;  Service: Orthopedics;  Laterality: Right;   REMOVAL OF STONES  09/13/2020   Procedure: REMOVAL OF STONES;  Surgeon: Rachael FeeJacobs, Daniel P, MD;  [facility];  Service: Endoscopy;;   SPHINCTEROTOMY  09/13/2020   Procedure: Dennison MascotSPHINCTEROTOMY;  Surgeon: Rachael FeeJacobs, Daniel P, MD;  [facility];  Service: Endoscopy;;   TONSILLECTOMY      FAMILY HISTORY Family History  Problem Relation Age of Onset   Heart  attack Father    Heart disease Father    Stroke Father    Stroke Sister    Hypertension Mother    Stroke Mother    Neuropathy Brother    COPD Sister     SOCIAL HISTORY Social History   Tobacco Use   Smoking status: Former    Packs/day: 0.50    Types: Cigarettes    Quit date: 11/30/1991    Years since quitting: 29.9   Smokeless tobacco: Never   Tobacco comments:    Tobacco use-no  Substance Use Topics   Alcohol use: No  Drug use: No         OPHTHALMIC EXAM:  Base Eye Exam     Visual Acuity (ETDRS)       Right Left   Dist cc 20/100 20/100 -1   Dist ph cc 20/60 NI         Tonometry (Tonopen, 10:01 AM)       Right Left   Pressure 13 13         Pupils       Pupils Dark Light Shape React APD   Right PERRL 1 1 Round Minimal None   Left PERRL 1 1 Round Minimal None         Visual Fields       Left Right    Full Full         Extraocular Movement       Right Left    Full, Ortho Full, Ortho         Neuro/Psych     Oriented x3: Yes   Mood/Affect: Normal         Dilation     Both eyes: 1.0% Mydriacyl, 2.5% Phenylephrine @ 10:01 AM           Slit Lamp and Fundus Exam     External Exam       Right Left   External Normal Normal         Slit Lamp Exam       Right Left   Lids/Lashes Normal Normal   Conjunctiva/Sclera White and quiet White and quiet   Cornea Clear Clear   Anterior Chamber Deep and quiet Deep and quiet   Iris Round and reactive,pinhole Round and reactive   Lens Clear Centered posterior chamber intraocular lens   Anterior Vitreous Normal Normal         Fundus Exam       Right Left   Posterior Vitreous Posterior vitreous detachment Posterior vitreous detachment, Central vitreous floaters   Disc Normal Normal, collateral vessels on the nerve   C/D Ratio 0.2 0.35   Macula Hard drusen, Atrophy, no macular thickening, no exudates, no cystoid macular edema,  No Subretinal hemorrhage, no macular  thickening, Soft drusen, Geographic atrophy   Vessels Normal, occlusion. Normal   Periphery Normal Normal            IMAGING AND PROCEDURES  Imaging and Procedures for 11/18/21  OCT, Retina - OU - Both Eyes       Right Eye Quality was good. Scan locations included subfoveal. Central Foveal Thickness: 240. Progression has been stable. Findings include abnormal foveal contour, retinal drusen .   Left Eye Quality was good. Scan locations included subfoveal. Central Foveal Thickness: 251. Progression has been stable. Findings include abnormal foveal contour, retinal drusen .   Notes OD, stable overall  OS with much improved perifoveal wet AMD overall post Eylea, 9-week follow-up in the past, slightly worse at 10-week interval in the past, much improved since Feb, 2022, repeat injection today maintain interval of 9 weeks     Intravitreal Injection, Pharmacologic Agent - OS - Left Eye       Time Out 11/18/2021. 10:54 AM. Confirmed correct patient, procedure, site, and patient consented.   Anesthesia Topical anesthesia was used. Anesthetic medications included Lidocaine 4%.   Procedure Preparation included 5% betadine to ocular surface, 10% betadine to eyelids, Tobramycin 0.3%. A 30 gauge needle was used.   Injection: 2 mg aflibercept 2 MG/0.05ML   Route: Intravitreal, Site: Left  Eye   NDC: [id number], Lot: [id number], Waste: 0 mL   Post-op Post injection exam found visual acuity of at least counting fingers. The patient tolerated the procedure well. There were no complications. The patient received written and verbal post procedure care education. Post injection medications included ocuflox.              ASSESSMENT/PLAN:  Advanced nonexudative age-related macular degeneration of left eye with subfoveal involvement Accounts for acuity  Advanced nonexudative age-related macular degeneration of right eye without subfoveal involvement Progressive into the  FAZ  Exudative age-related macular degeneration of left eye with active choroidal neovascularization (HCC) Stable today at 9-week follow-up.  On Eylea.  Less intraretinal fluid.  Repeat injection intravitreal Eylea today to maintain.  Follow-up again in 9 weeks  Exudative age-related macular degeneration of right eye with inactive choroidal neovascularization (HCC) No sign of recurrence OD      ICD-10-CM   1. Exudative age-related macular degeneration of left eye with active choroidal neovascularization (HCC)  H35.3221 OCT, Retina - OU - Both Eyes    Intravitreal Injection, Pharmacologic Agent - OS - Left Eye    aflibercept (EYLEA) SOLN 2 mg    2. Advanced nonexudative age-related macular degeneration of left eye with subfoveal involvement  H35.3124     3. Advanced nonexudative age-related macular degeneration of right eye without subfoveal involvement  H35.3113     4. 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  2.  3.  Ophthalmic Meds Ordered this visit:  Meds ordered this encounter  Medications   aflibercept (EYLEA) SOLN 2 mg       Return in about 9 weeks (around 01/20/2022) for dilate, OS, EYLEA OCT.  There are no Patient Instructions on file for this visit.   Explained the diagnoses, plan, and follow up with the patient and they expressed understanding.  Patient expressed understanding of the importance of proper follow up care.   Alford HighlandGary A. Shervon Kerwin M.D. Diseases & Surgery of the Retina and Vitreous [facility] 11/18/21     Abbreviations: M myopia (nearsighted); A astigmatism; H hyperopia (farsighted); P presbyopia; Mrx spectacle prescription;  CTL contact lenses; OD right eye; OS left eye; OU both eyes  XT exotropia; ET esotropia; PEK punctate epithelial keratitis; PEE punctate epithelial erosions; DES dry eye syndrome; MGD meibomian gland dysfunction; ATs artificial  tears; PFAT's preservative free artificial tears; NSC nuclear sclerotic cataract; PSC posterior subcapsular cataract; ERM epi-retinal membrane; PVD posterior vitreous detachment; RD retinal detachment; DM diabetes mellitus; DR diabetic retinopathy; NPDR non-proliferative diabetic retinopathy; PDR proliferative diabetic retinopathy; CSME clinically significant macular edema; DME diabetic macular edema; dbh dot blot hemorrhages; CWS cotton wool spot; POAG primary open angle glaucoma; C/D cup-to-disc ratio; HVF humphrey visual field; GVF goldmann visual field; OCT optical coherence tomography; IOP intraocular pressure; BRVO Branch retinal vein occlusion; CRVO central retinal vein occlusion; CRAO central retinal artery occlusion; BRAO branch retinal artery occlusion; RT retinal tear; SB scleral buckle; PPV pars plana vitrectomy; VH Vitreous hemorrhage; PRP panretinal laser photocoagulation; IVK intravitreal kenalog; VMT vitreomacular traction; MH Macular hole;  NVD neovascularization of the disc; NVE neovascularization elsewhere; AREDS age related eye disease study; ARMD age related macular degeneration; POAG primary open angle glaucoma; EBMD epithelial/anterior basement membrane dystrophy; ACIOL anterior chamber intraocular lens; IOL intraocular lens; PCIOL posterior chamber intraocular lens; Phaco/IOL phacoemulsification with intraocular lens placement; PRK photorefractive keratectomy; LASIK laser assisted in situ  keratomileusis; HTN hypertension; DM diabetes mellitus; COPD chronic obstructive pulmonary disease

## 2021-11-18 NOTE — Assessment & Plan Note (Signed)
No sign of recurrence OD ?

## 2021-11-24 ENCOUNTER — Ambulatory Visit (INDEPENDENT_AMBULATORY_CARE_PROVIDER_SITE_OTHER): Payer: Medicare Other | Admitting: Cardiovascular Disease

## 2021-11-24 DIAGNOSIS — Z5181 Encounter for therapeutic drug level monitoring: Secondary | ICD-10-CM

## 2021-11-24 LAB — POCT INR: INR: 2.5 (ref 2.0–3.0)

## 2021-11-24 NOTE — Patient Instructions (Signed)
Description   Called and spoke to pt' daughter and instructed for pt to continue to take warfarin 1/2 a tablet daily EXCEPT for 1 tablet on Monday, Wednesday and Friday.  Recheck INR in 2 weeks.   *PLEASE talk to daughter [name] for dosing instructions* *Home meter, checks every 2 weeks*

## 2021-12-08 ENCOUNTER — Ambulatory Visit (INDEPENDENT_AMBULATORY_CARE_PROVIDER_SITE_OTHER): Payer: Medicare Other | Admitting: Cardiology

## 2021-12-08 ENCOUNTER — Other Ambulatory Visit: Payer: Self-pay

## 2021-12-08 ENCOUNTER — Other Ambulatory Visit: Payer: Medicare Other

## 2021-12-08 VITALS — BP 104/40 | HR 61 | Temp 97.7°F | Resp 20 | Wt 114.0 lb

## 2021-12-08 DIAGNOSIS — I359 Nonrheumatic aortic valve disorder, unspecified: Secondary | ICD-10-CM

## 2021-12-08 DIAGNOSIS — Z952 Presence of prosthetic heart valve: Secondary | ICD-10-CM

## 2021-12-08 DIAGNOSIS — Z5181 Encounter for therapeutic drug level monitoring: Secondary | ICD-10-CM | POA: Diagnosis not present

## 2021-12-08 DIAGNOSIS — Z515 Encounter for palliative care: Secondary | ICD-10-CM

## 2021-12-08 DIAGNOSIS — I6350 Cerebral infarction due to unspecified occlusion or stenosis of unspecified cerebral artery: Secondary | ICD-10-CM

## 2021-12-08 LAB — POCT INR: INR: 4 — AB (ref 2.0–3.0)

## 2021-12-08 NOTE — Patient Instructions (Addendum)
Description   Called and spoke to pt' daughter and instructed for pt to Hold warfarin today and then continue to take warfarin 1/2 a tablet daily EXCEPT for 1 tablet on Monday, Wednesday and Friday.  Recheck INR in 1 week.   *PLEASE talk to daughter [name] for dosing instructions* *Home meter, checks every 2 weeks*

## 2021-12-08 NOTE — Progress Notes (Signed)
PATIENT NAME: [name] DOB: 1926-01-23 MRN: [id number]  PRIMARY CARE PROVIDER: Celene Squibb, MD  RESPONSIBLE PARTY:  Acct ID - Guarantor Home Phone Work Phone Relationship Acct Type  [id number] COURTNAY, [phone]  Self P/F     [place], [place], [place] [address]    PLAN OF CARE and INTERVENTIONS:               1.  GOALS OF CARE/ ADVANCE CARE PLANNING:  Remain home under the care of her daughter and private caregiver.               2.  PATIENT/CAREGIVER EDUCATION:  Skin care.               4. PERSONAL EMERGENCY PLAN:  Activate 911 for emergencies.               5.  DISEASE STATUS:  Joint visit completed with patient and caregiver [name].   Skin:  Patient has a new stage 2 to her right lateral ankle.  She has been placing a bandaid with neosporin to the area.  We discussed using optiofoam or duoderm for protection.  Elevating her feet while in the bed at night.  Discussed bunny boots as an option.  Examples provided to cargiver [name] who will order them.  Cardiac:  No chest pain reported.  Continues to have mild dizziness but this resolves quickly.  Typically occurs from sitting to standing.  Patient states she will stand for a short period of time and then ambulates without any issues.   Patient has a cardiology follow up next month.  GI:  Patient notes some abdominal discomfort after having bowel movements.  Continues with a scant amount of bleeding after bowel movements.  Patient notes a hemorrhoid is present.   Respiratory:  continues with O2 at 4 L via [place].   She denies shortness of breath.  She will occasionally go without her O2 when she goes to the gym to visit her friends.  She continues to carry her portable tank when she is outside of the home.   INR:  [name] advised level was 4 today.  They have contacted cardiology with results and are waiting for a call back with dosing instructions.   Callback received during this visit.  Coumadin to be held today and start  back with 2.5 mg tomorrow.    HISTORY OF PRESENT ILLNESS:  88 year old female with COPD and CHF.  Patient is followed by Palliative Care monthly and PRN.     CODE STATUS: DNR ADVANCED DIRECTIVES: No MOST FORM: N PPS: 50%   PHYSICAL EXAM:   VITALS: Today's Vitals   12/08/21 1007  BP: (!) 104/40  Pulse: 61  Resp: 20  Temp: 97.7 F (36.5 C)  SpO2: 98%  Weight: 114 lb (51.7 kg)  PainSc: 0-No pain    LUNGS: clear to auscultation  CARDIAC: Cor RRR}  EXTREMITIES: - for edema SKIN: Skin color, texture, turgor normal. No rashes or lesions or right ankle with stage 2 pressure ulcer.   NEURO: positive for gait problems and memory problems       Lorenza Burton, RN

## 2021-12-16 ENCOUNTER — Ambulatory Visit (INDEPENDENT_AMBULATORY_CARE_PROVIDER_SITE_OTHER): Payer: Medicare Other | Admitting: Cardiovascular Disease

## 2021-12-16 DIAGNOSIS — Z5181 Encounter for therapeutic drug level monitoring: Secondary | ICD-10-CM

## 2021-12-16 DIAGNOSIS — I6350 Cerebral infarction due to unspecified occlusion or stenosis of unspecified cerebral artery: Secondary | ICD-10-CM

## 2021-12-16 DIAGNOSIS — I359 Nonrheumatic aortic valve disorder, unspecified: Secondary | ICD-10-CM

## 2021-12-16 DIAGNOSIS — Z952 Presence of prosthetic heart valve: Secondary | ICD-10-CM

## 2021-12-16 LAB — POCT INR: INR: 2.7 (ref 2.0–3.0)

## 2021-12-16 NOTE — Progress Notes (Signed)
Virtual Visit via Video Note   This visit type was conducted due to national recommendations for restrictions regarding the COVID-19 Pandemic (e.g. social distancing) in an effort to limit this patient's exposure and mitigate transmission in our community.  Due to her co-morbid illnesses, this patient is at least at moderate risk for complications without adequate follow up.  This format is felt to be most appropriate for this patient at this time.  All issues noted in this document were discussed and addressed.  A limited physical exam was performed with this format.  Please refer to the patient's chart for her consent to telehealth for [facility].      Date:  12/29/2021   ID:  [name], DOB [birth year], MRN [id number]  Patient Location:Home Provider Location: Home  PCP:  Celene Squibb, MD  Cardiologist:  Dr Stanford Breed  Evaluation Performed:  Follow-Up Visit  Chief Complaint:  FU CAD  History of Present Illness:    FU coronary artery disease (s/p CABG), prior aortic valve replacement and aortic insufficiency. Last cardiac catheterization in 2011 revealed normal left main; 30% first diagonal, 50% LAD and 60% second diagonal. There was a 40-50% circumflex and a 40% right coronary artery. All grafts were occluded. Medical therapy recommended. Transesophageal echocardiogram in February of 2012 showed an ejection fraction of 45%. There was a [employer] Jude aortic valve with moderate perivalvular aortic insufficiency. There was moderate mitral regurgitation, moderate left atrial enlargement, mild right atrial enlargement and mild tricuspid regurgitation. Carotid dopplers August 2020 showed 40 to 59% right and 1 to 39% left stenosis. Echocardiogram October 2021 interpreted as normal LV function, moderate left ventricular hypertrophy, grade 1 diastolic dysfunction, biatrial enlargement, mild to moderate mitral regurgitation, previous aortic valve replacement with moderate aortic insufficiency. I  personally reviewed the images and ejection fraction appears to be 40-45%. CTA October 2021 showed 2.9 x 3.1 cm abdominal aortic aneurysm. Since she was last seen, she complains of weakness.  She has some dyspnea with activities but not at rest.  No orthopnea, PND, pedal edema, chest pain or syncope.  She continues to have some dizziness with standing.  She has decreased p.o. intake.  The patient does not have symptoms concerning for COVID-19 infection (fever, chills, cough, or new shortness of breath).    Past Medical History:  Diagnosis Date   Anemia    Anxiety    Arthritis    Blood transfusion without reported diagnosis    Carotid artery stenosis    Without infarction   Cataract    Chronic combined systolic (congestive) and diastolic (congestive) heart failure (HCC)    COPD (chronic obstructive pulmonary disease) ([place])    Wears 02 at home 2 L   Coronary artery disease    Heart valve replaced by other means    Hypercholesterolemia    Pure   Hypertension    Unspecified   LBBB (left bundle branch block)    Macular degeneration (senile) of retina, unspecified    On home O2    Postsurgical aortocoronary bypass status    Stroke ([place])    Transient global amnesia    Unspecified hereditary and idiopathic peripheral neuropathy    Unspecified vitamin D deficiency    Past Surgical History:  Procedure Laterality Date   AORTIC VALVE REPLACEMENT     APPENDECTOMY     CORONARY ARTERY BYPASS GRAFT     ERCP N/A 09/13/2020   Procedure: ENDOSCOPIC RETROGRADE CHOLANGIOPANCREATOGRAPHY (ERCP);  Surgeon: Milus Banister, MD;  Location:  [place] [facility];  Service: Endoscopy;  Laterality: N/A;   HIP PINNING,CANNULATED Right 07/18/2018   Procedure: RIGHT CANNULATED HIP PINNING;  Surgeon: Altamese Goshen, MD;  Location: [place];  Service: Orthopedics;  Laterality: Right;   REMOVAL OF STONES  09/13/2020   Procedure: REMOVAL OF STONES;  Surgeon: Milus Banister, MD;  Location: [facility] ENDOSCOPY;  Service:  Endoscopy;;   SPHINCTEROTOMY  09/13/2020   Procedure: Joan Mayans;  Surgeon: Milus Banister, MD;  Location[facility] ENDOSCOPY;  Service: Endoscopy;;   TONSILLECTOMY       Current Meds  Medication Sig   acetaminophen (TYLENOL) 650 MG CR tablet Take 650 mg by mouth every 8 (eight) hours as needed for pain.   ALPRAZolam (XANAX) 0.25 MG tablet Take 0.125-0.25 mg by mouth at bedtime as needed for anxiety.    ANORO ELLIPTA 62.5-25 MCG/INH AEPB USE 1 INHALATION ONCE DAILY.   atorvastatin (LIPITOR) 40 MG tablet TAKE ONE TABLET BY MOUTH ONCE DAILY.   baclofen (LIORESAL) 10 MG tablet Take 10 mg by mouth as needed for muscle spasms.   docusate sodium (COLACE) 100 MG capsule Take 100 mg by mouth every evening.    donepezil (ARICEPT) 10 MG tablet TAKE ONE TABLET BY MOUTH ONCE DAILY. (Patient taking differently: Take 10 mg by mouth at bedtime.)   ergocalciferol (VITAMIN D2) 50000 UNITS capsule Take 50,000 Units by mouth every Tuesday.   ferrous sulfate 325 (65 FE) MG EC tablet Take 325 mg by mouth daily.    furosemide (LASIX) 40 MG tablet TAKE ONE TABLET BY MOUTH ONCE DAILY.   HYDROcodone-acetaminophen (NORCO) 7.5-325 MG tablet Take 1 tablet by mouth every 6 (six) hours as needed for moderate pain.   hydrocortisone (ANUSOL-HC) 2.5 % rectal cream Place 1 application rectally 3 (three) times daily as needed for hemorrhoids or anal itching.   levalbuterol (XOPENEX) 0.63 MG/3ML nebulizer solution USE 1 VIAL VIA NEBULIZER EVERY 8 HOURS AS NEEDED.   meclizine (ANTIVERT) 25 MG tablet Take 25 mg by mouth 3 (three) times daily as needed for dizziness.   Melatonin 3 MG TABS Take 1 tablet by mouth at bedtime.   methylPREDNISolone (MEDROL) 4 MG tablet Take 4 mg by mouth 2 (two) times daily as needed.   multivitamin-lutein (OCUVITE-LUTEIN) CAPS capsule Take 1 capsule by mouth daily.   ondansetron (ZOFRAN) 4 MG tablet Take 4 mg by mouth every 8 (eight) hours as needed for nausea or vomiting.   polyethylene glycol  (MIRALAX / GLYCOLAX) 17 g packet Take 17 g by mouth daily as needed for moderate constipation.   pregabalin (LYRICA) 75 MG capsule TAKE 1 CAPSULE BY MOUTH EVERY OTHER DAY.   traZODone (DESYREL) 50 MG tablet Take 0.5-1 tablets (25-50 mg total) by mouth at bedtime as needed for sleep.   warfarin (COUMADIN) 5 MG tablet TAKE 1/2 TO 1 TABLET ONCE DAILY AS DIRECTED BY COUMADIN CLINIC.   zinc sulfate 50 MG CAPS capsule Take 50 mg by mouth 2 (two) times daily.      Allergies:   Codeine and Penicillins   Social History   Tobacco Use   Smoking status: Former    Packs/day: 0.50    Types: Cigarettes    Quit date: 11/30/1991    Years since quitting: 30.1   Smokeless tobacco: Never   Tobacco comments:    Tobacco use-no  Substance Use Topics   Alcohol use: No   Drug use: No     Family Hx: The patient's family history includes COPD in her sister; Heart  attack in her father; Heart disease in her father; Hypertension in her mother; Neuropathy in her brother; Stroke in her father, mother, and sister.  ROS:   Please see the history of present illness.    No Fever, chills  or productive cough All other systems reviewed and are negative.     Recent Labs: 06/26/2021: ALT 18; BUN 26; Creatinine, Ser 1.34; Hemoglobin 9.8; Platelets 206; Potassium 3.6; Sodium 142   Recent Lipid Panel Lab Results  Component Value Date/Time   CHOL 152 06/26/2021 10:06 AM   CHOL 141 11/22/2019 12:00 AM   TRIG 69 06/26/2021 10:06 AM   TRIG 66 11/22/2019 12:00 AM   HDL 66 06/26/2021 10:06 AM   HDL 55 11/22/2019 12:00 AM   CHOLHDL 2.3 06/26/2021 10:06 AM   CHOLHDL 2.7 07/03/2018 07:00 AM   LDLCALC 72 06/26/2021 10:06 AM    Wt Readings from Last 3 Encounters:  12/29/21 120 lb (54.4 kg)  12/08/21 114 lb (51.7 kg)  11/04/21 113 lb (51.3 kg)     Objective:    Vital Signs:  BP 124/67 (BP Location: Right Arm)    Pulse 69    Ht 5\' 2"  (1.575 m)    Wt 120 lb (54.4 kg)    BMI 21.95 kg/m    VITAL SIGNS:   reviewed NAD Answers questions appropriately Normal affect Remainder of physical examination not performed (telehealth visit; coronavirus pandemic)  ASSESSMENT & PLAN:     1 chronic systolic congestive heart failure-she appears euvolemic on examination.  Continue diuretic at present dose.  Check potassium and renal function when she has her blood drawn with primary care next week.  2 status post aortic valve replacement-continue Coumadin.  Check hemoglobin with blood draw next week.  Note she does have a history of moderate aortic insufficiency but we are treating this conservatively given her age.  3 cardiomyopathy-patient has had orthostatic symptoms previously.  I have therefore avoided ARB and beta-blockade.  4 hypertension-blood pressure is controlled.  5 hyperlipidemia-continue statin.  6 coronary artery disease-patient is not having chest pain.  Continue statin.  No aspirin given need for Coumadin.  7 carotid artery disease-given her age and frail body habitus I do not think she would be a good candidate for surgical intervention and we have therefore not pursue follow-up carotid Dopplers.  Continue statin.  8 abdominal aortic aneurysm-as above we are treating conservatively given her age.  We have therefore not pursue further imaging.  She would not be a good candidate for surgical repair.  COVID-19 Education: The importance of social distancing was discussed today.  Time:   Today, I have spent 16 minutes with the patient with telehealth technology discussing the above problems.     Medication Adjustments/Labs and Tests Ordered: Current medicines are reviewed at length with the patient today.  Concerns regarding medicines are outlined above.   Tests Ordered: No orders of the defined types were placed in this encounter.   Medication Changes: No orders of the defined types were placed in this encounter.   Follow Up:  In Person in 6 month(s)  Signed, Kirk Ruths,  MD  12/29/2021 9:54 AM    [place]

## 2021-12-19 ENCOUNTER — Other Ambulatory Visit: Payer: Self-pay | Admitting: Cardiology

## 2021-12-29 ENCOUNTER — Ambulatory Visit (INDEPENDENT_AMBULATORY_CARE_PROVIDER_SITE_OTHER): Payer: Medicare Other | Admitting: Cardiology

## 2021-12-29 ENCOUNTER — Telehealth (INDEPENDENT_AMBULATORY_CARE_PROVIDER_SITE_OTHER): Payer: Medicare Other | Admitting: Cardiology

## 2021-12-29 ENCOUNTER — Other Ambulatory Visit: Payer: Self-pay

## 2021-12-29 ENCOUNTER — Encounter: Payer: Self-pay | Admitting: Cardiology

## 2021-12-29 VITALS — BP 124/67 | HR 69 | Ht 62.0 in | Wt 120.0 lb

## 2021-12-29 DIAGNOSIS — I359 Nonrheumatic aortic valve disorder, unspecified: Secondary | ICD-10-CM

## 2021-12-29 DIAGNOSIS — I2510 Atherosclerotic heart disease of native coronary artery without angina pectoris: Secondary | ICD-10-CM | POA: Diagnosis not present

## 2021-12-29 DIAGNOSIS — I10 Essential (primary) hypertension: Secondary | ICD-10-CM

## 2021-12-29 DIAGNOSIS — Z952 Presence of prosthetic heart valve: Secondary | ICD-10-CM

## 2021-12-29 DIAGNOSIS — I255 Ischemic cardiomyopathy: Secondary | ICD-10-CM

## 2021-12-29 DIAGNOSIS — Z5181 Encounter for therapeutic drug level monitoring: Secondary | ICD-10-CM

## 2021-12-29 LAB — POCT INR: INR: 3 (ref 2.0–3.0)

## 2021-12-29 NOTE — Patient Instructions (Signed)
  Follow-Up: At [facility], you and your health needs are our priority.  As part of our continuing mission to provide you with exceptional heart care, we have created designated Provider Care Teams.  These Care Teams include your primary Cardiologist (physician) and Advanced Practice Providers (APPs -  Physician Assistants and Nurse Practitioners) who all work together to provide you with the care you need, when you need it.  We recommend signing up for the patient portal called "MyChart".  Sign up information is provided on this After Visit Summary.  MyChart is used to connect with patients for Virtual Visits (Telemedicine).  Patients are able to view lab/test results, encounter notes, upcoming appointments, etc.  Non-urgent messages can be sent to your provider as well.   To learn more about what you can do with MyChart, go to [email or web address].    Your next appointment:   3 month(s)  The format for your next appointment:   In Person  Provider:   Brian Crenshaw, MD    

## 2021-12-29 NOTE — Patient Instructions (Signed)
Description   Called and spoke to pt' daughter, continue to take warfarin 1/2 a tablet daily EXCEPT for 1 tablet on Monday, Wednesday and Friday.  Recheck INR in 2 weeks.   *PLEASE talk to daughter [name] for dosing instructions* *Home meter, checks every 2 weeks*

## 2022-01-04 ENCOUNTER — Telehealth: Payer: Self-pay

## 2022-01-04 NOTE — Telephone Encounter (Signed)
9 am.  Phone call made to patient to change upcoming appointment.  Patient will be seen on 01/14/22 @ 10 am.

## 2022-01-12 ENCOUNTER — Ambulatory Visit (INDEPENDENT_AMBULATORY_CARE_PROVIDER_SITE_OTHER): Payer: Medicare Other | Admitting: Cardiology

## 2022-01-12 DIAGNOSIS — Z5181 Encounter for therapeutic drug level monitoring: Secondary | ICD-10-CM | POA: Diagnosis not present

## 2022-01-12 LAB — POCT INR: INR: 3 (ref 2.0–3.0)

## 2022-01-12 NOTE — Patient Instructions (Signed)
Description   ?Called and spoke to pt' daughter, instructed for pt to continue to take warfarin 1/2 a tablet daily EXCEPT for 1 tablet on Monday, Wednesday and Friday.  Recheck INR in 2 weeks.  ? ?*PLEASE talk to daughter [name] for dosing instructions* ?*Home meter, checks every 2 weeks* ?  ? ? ?

## 2022-01-14 ENCOUNTER — Other Ambulatory Visit: Payer: Medicare Other | Admitting: Nurse Practitioner

## 2022-01-14 ENCOUNTER — Encounter: Payer: Self-pay | Admitting: Nurse Practitioner

## 2022-01-14 ENCOUNTER — Other Ambulatory Visit: Payer: Self-pay

## 2022-01-14 VITALS — BP 118/40 | HR 62 | Temp 98.1°F | Wt 118.0 lb

## 2022-01-14 DIAGNOSIS — Z515 Encounter for palliative care: Secondary | ICD-10-CM

## 2022-01-14 DIAGNOSIS — J449 Chronic obstructive pulmonary disease, unspecified: Secondary | ICD-10-CM

## 2022-01-14 DIAGNOSIS — R0602 Shortness of breath: Secondary | ICD-10-CM

## 2022-01-14 DIAGNOSIS — I5040 Unspecified combined systolic (congestive) and diastolic (congestive) heart failure: Secondary | ICD-10-CM

## 2022-01-14 DIAGNOSIS — R5381 Other malaise: Secondary | ICD-10-CM

## 2022-01-14 NOTE — Progress Notes (Signed)
[facility] Consult Note Telephone: [phone]  Fax: [phone]   Date of encounter: 01/14/22 10:04 AM PATIENT NAME: [name] [place] [id number]   [phone] (home)  DOB: 1926/08/17 MRN: [id number] PRIMARY CARE PROVIDER:    Celene Squibb, MD,  [place] [address] [phone]  REFERRING PROVIDER:   Celene Squibb, MD [place],  [place] [address] [phone]  RESPONSIBLE PARTY:    Contact Information     Name Relation Home Work Mobile   [name] Daughter [phone]   [name] Granddaughter [phone]  [phone]   [name], [name] Daughter   [place], [place]   [phone]      Due to the COVID-19 crisis, this visit was done via telemedicine from my office and it was initiated and consent by this patient and or family.  I connected with [name] RN/PC with  [name] on 01/14/22 by a video enabled telemedicine application and verified that I am speaking with the correct person using two identifiers.   I discussed the limitations of evaluation and management by telemedicine. The patient expressed understanding and agreed to proceed.   I met face to face with patient and family in the home connecting virtually with [name], RN.   Palliative Care was asked to follow this patient by consultation request of  Celene Squibb, MD to address advance care planning and complex medical decision making. This is the initial visit.                       ASSESSMENT AND PLAN / RECOMMENDATIONS:  Symptom Management/Plan:  ACP: DNR; declined workup for trace of blood in stool; avoid hospitalizations  2.  Shortness of breath secondary to COPD, CHF, stable, continue monitoring weights, supplemental O2,   3. Debility secondary to COPD, CHF, continue strengthening, mobility, encourage self  training.   11/04/2021 weight 113 lbs 12/08/2021 weight 114 lbs 12/29/2021 weight 120 lbs BMI 21.95; gain 6 lbs/4 weeks  DME:   Patient reports discomfort with her current mattress that is supplied by [employer].  Patient feels she is "in a hole" when she lays down and is now causing back pain.  She is requesting that I contact the DME provider to see if anything else can be done.  3 pm.  Phone call made to [employer] and spoke with [place].  Patient is not showing in the system but her information may not have transferred over when [employer] changed to [employer].  Advised that [employer] sticker is located on the hospital bed.  Per Lurline Idol a script for a new mattress will need to be faxed to [employer] and this can be replaced.   4. Palliative care encounter; Palliative care encounter; Palliative medicine team will continue to support patient, patient's family, and medical team. Visit consisted of counseling and education dealing with the complex and emotionally intense issues of symptom management and palliative care in the setting of serious and potentially life-threatening illness  Follow up Palliative Care Visit: Palliative care will continue to follow for complex medical decision making, advance care planning, and clarification of goals. Return 3 weeks or prn.  I spent 62 minutes providing this consultation. More than 50% of the time in this consultation was spent in counseling and care coordination. PPS: 50% Chief Complaint: Follow up palliative consult for complex  medical decision making  HISTORY OF PRESENT ILLNESS:  [name] is a 88 y.o. 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 I connected with  [name] RN PC for visual telemedicine visit with [name], caregiver with [name]. We talked about ros, symptoms, how [name] has been feeling. We talked about her functional and cognitive abilities. We talked about daily routine, going to eat lunch, getting her hair done. No recent falls, wounds, infections, hospitalizations. [name] does complete PT/INR. We talked about appetite, foods she likes, mattress request. Medical goals reviewed. We talked about things [name] improves quality of life. We talked about things she likes to do. We talked about f/u pc visit, scheduled. Therapeutic listening, emotional support provided, question answered.   History obtained from review of EMR, discussion with primary team, and interview with family, facility staff/caregiver and/or [name].  I reviewed available labs, medications, imaging, studies and related documents from the EMR.  Records reviewed and summarized above.   ROS General: NAD EYES: denies vision changes ENMT: denies dysphagia Cardiovascular: denies chest pain, +  DOE Pulmonary: denies cough, denies increased SOB Abdomen: endorses good appetite, denies constipation, endorses continence of bowel, trace of blood noted after having bowel movements GU: denies dysuria, endorses continence of urine MSK:  denies increased weakness,  no falls reported Skin: denies rashes or wounds Neurological: denies pain, denies insomnia Psych: Endorses positive mood  Physical Exam: per [name] RN/PC Current and past weights: 118 lbs today, last month 114 lbs Constitutional: NAD General: frail appearing, thin EYES: anicteric sclera, lids intact, no discharge  ENMT: + hearing loss, oral mucous membranes moist, dentition intact CV: RRR, no LE edema Pulmonary: LCTA, no increased work of breathing, no cough, room air Abdomen: intake 50-100%, normo-active BS + 4 quadrants, soft and non tender MSK: no sarcopenia, moves all extremities,  ambulatory with a cane or 1 person assistance Skin: warm and dry, no rashes or wounds on visible skin Neuro:  no generalized weakness,  +  cognitive impairment (more forgetful with short term memory) Psych: non-anxious affect, A and O x 3 CURRENT PROBLEM LIST:  Patient Active Problem List   Diagnosis Date Noted   Advanced nonexudative age-related macular degeneration of left eye with subfoveal involvement 05/11/2021   Advanced nonexudative age-related macular degeneration of right eye without subfoveal involvement 01/08/2021   Choledocholithiasis 09/08/2020   Hx of CABG 08/20/2020   Ischemic cardiomyopathy 08/20/2020   Orthostatic dizziness 08/20/2020   Chronically dry eyes, bilateral 07/02/2020   Exudative age-related macular degeneration of left eye with active choroidal neovascularization ([place]) 04/30/2020   Exudative age-related macular degeneration of right eye with inactive choroidal neovascularization ([place]) 04/30/2020   Serous detachment of retinal pigment epithelium of right eye 04/30/2020   Acute on chronic respiratory failure with hypoxia and hypercapnia (HCC) 02/07/2020   Palliative care by specialist    Acute on chronic combined systolic and diastolic CHF (congestive heart failure) ([place]) 02/06/2020   Acute on chronic systolic HF (heart failure) ([place]) 02/05/2020   Chronic respiratory failure with hypoxia and hypercapnia ([place]) 01/02/2020   Disorder of arteries and arterioles, unspecified ([place]) 01/01/2020   Cough 12/24/2019   History of COVID-19 11/27/2019   Vitamin D deficiency 11/19/2019   Localized swelling of left  foot 11/19/2019   Insomnia 11/19/2019   Pneumonia due to COVID-19 virus 10/25/2019   Hematoma of thigh, right, initial encounter 07/23/2018   Chronic combined systolic and diastolic CHF (congestive heart failure) ([place]) 07/23/2018   Supratherapeutic INR 07/23/2018   Acute blood loss anemia 07/23/2018   CKD (chronic kidney disease) stage 3, GFR 30-59 ml/min (HCC)  07/23/2018   COPD GOLD B symptoms/risk  07/23/2018   Hematoma of right thigh 07/23/2018   Pressure injury of skin 07/23/2018   Hip fracture ([place]) 07/16/2018   COPD exacerbation ([place]) 07/01/2018   Right hip pain 07/01/2018   Chronic respiratory failure with hypoxia, on home O2 therapy ([place]) 07/01/2018   CHF (congestive heart failure) ([place]) 25/95/6387   Acute diastolic heart failure ([place]) 06/27/2018   Anemia 06/27/2018   Congestive dilated cardiomyopathy ([place]) 09/29/2015   Encounter for therapeutic drug monitoring 12/12/2013   Transient confusion 04/16/2013   Congestive heart failure ([place]) 11/28/2012   Dehydration 11/27/2012   Dementia ([place]) 11/27/2012   Pneumonia 11/24/2012   Aortic valve disorder 01/13/2011   Carotid artery stenosis 10/13/2009   S/P AVR (aortic valve replacement) 10/13/2009   Hyperlipidemia 03/06/2009   Essential hypertension 03/06/2009   CAD, ARTERY BYPASS GRAFT 03/06/2009   LBBB (left bundle branch block) 03/06/2009   Cerebral artery occlusion with cerebral infarction ([place]) 03/06/2009   PAST MEDICAL HISTORY:  Active Ambulatory Problems    Diagnosis Date Noted   Hyperlipidemia 03/06/2009   Essential hypertension 03/06/2009   CAD, ARTERY BYPASS GRAFT 03/06/2009   LBBB (left bundle branch block) 03/06/2009   Carotid artery stenosis 10/13/2009   Cerebral artery occlusion with cerebral infarction ([place]) 03/06/2009   S/P AVR (aortic valve replacement) 10/13/2009   Aortic valve disorder 01/13/2011   Pneumonia 11/24/2012   Dehydration 11/27/2012   Dementia ([place]) 11/27/2012   Congestive heart failure ([place]) 11/28/2012   Transient confusion 04/16/2013   Encounter for therapeutic drug monitoring 12/12/2013   Congestive dilated cardiomyopathy ([place]) 56/43/3295   Acute diastolic heart failure ([place]) 06/27/2018   Anemia 06/27/2018   CHF (congestive heart failure) ([place]) 06/28/2018   COPD exacerbation ([place]) 07/01/2018   Right hip pain 07/01/2018   Chronic respiratory  failure with hypoxia, on home O2 therapy ([place]) 07/01/2018   Hip fracture ([place]) 07/16/2018   Hematoma of thigh, right, initial encounter 07/23/2018   Chronic combined systolic and diastolic CHF (congestive heart failure) ([place]) 07/23/2018   Supratherapeutic INR 07/23/2018   Acute blood loss anemia 07/23/2018   CKD (chronic kidney disease) stage 3, GFR 30-59 ml/min (HCC) 07/23/2018   COPD GOLD B symptoms/risk  07/23/2018   Hematoma of right thigh 07/23/2018   Pressure injury of skin 07/23/2018   Pneumonia due to COVID-19 virus 10/25/2019   Vitamin D deficiency 11/19/2019   Localized swelling of left foot 11/19/2019   Insomnia 11/19/2019   History of COVID-19 11/27/2019   Cough 12/24/2019   Disorder of arteries and arterioles, unspecified ([place]) 01/01/2020   Chronic respiratory failure with hypoxia and hypercapnia ([place]) 01/02/2020   Acute on chronic systolic HF (heart failure) ([place]) 02/05/2020   Acute on chronic combined systolic and diastolic CHF (congestive heart failure) ([place]) 02/06/2020   Palliative care by specialist    Acute on chronic respiratory failure with hypoxia and hypercapnia ([place]) 02/07/2020   Exudative age-related macular degeneration of left eye with active choroidal neovascularization ([place]) 04/30/2020   Exudative age-related macular degeneration of right eye with inactive choroidal neovascularization ([place]) 04/30/2020   Serous detachment of retinal pigment epithelium  of right eye 04/30/2020   Chronically dry eyes, bilateral 07/02/2020   Hx of CABG 08/20/2020   Ischemic cardiomyopathy 08/20/2020   Orthostatic dizziness 08/20/2020   Choledocholithiasis 09/08/2020   Advanced nonexudative age-related macular degeneration of right eye without subfoveal involvement 01/08/2021   Advanced nonexudative age-related macular degeneration of left eye with subfoveal involvement 05/11/2021   Resolved Ambulatory Problems    Diagnosis Date Noted   Pure hypercholesterolemia 05/01/2010    Mild malnutrition ([place]) 11/28/2012   Memory loss 04/16/2013   Pressure injury of skin of heel 07/01/2018   Protein-calorie malnutrition, severe 07/18/2018   Past Medical History:  Diagnosis Date   Anxiety    Arthritis    Blood transfusion without reported diagnosis    Cataract    Chronic combined systolic (congestive) and diastolic (congestive) heart failure (HCC)    COPD (chronic obstructive pulmonary disease) (HCC)    Coronary artery disease    Heart valve replaced by other means    Hypercholesterolemia    Hypertension    Macular degeneration (senile) of retina, unspecified    On home O2    Postsurgical aortocoronary bypass status    Stroke ([place])    Transient global amnesia    Unspecified hereditary and idiopathic peripheral neuropathy    Unspecified vitamin D deficiency    SOCIAL HX:  Social History   Tobacco Use   Smoking status: Former    Packs/day: 0.50    Types: Cigarettes    Quit date: 11/30/1991    Years since quitting: 30.1   Smokeless tobacco: Never   Tobacco comments:    Tobacco use-no  Substance Use Topics   Alcohol use: No   FAMILY HX:  Family History  Problem Relation Age of Onset   Heart attack Father    Heart disease Father    Stroke Father    Stroke Sister    Hypertension Mother    Stroke Mother    Neuropathy Brother    COPD Sister       ALLERGIES:  Allergies  Allergen Reactions   Codeine Nausea And Vomiting   Penicillins Other (See Comments)    Unknown reaction - listed on MAR Has patient had a PCN reaction causing immediate rash, facial/tongue/throat swelling, SOB or lightheadedness with hypotension: Unknown Has patient had a PCN reaction causing severe rash involving mucus membranes or skin necrosis: Unknown Has patient had a PCN reaction that required hospitalization: Unknown Has patient had a PCN reaction occurring within the last 10 years: Unknown If all of the above answers are "NO", then may proceed with Cephalosporin use.      PERTINENT MEDICATIONS:  Outpatient Encounter Medications as of 01/14/2022  Medication Sig   acetaminophen (TYLENOL) 650 MG CR tablet Take 650 mg by mouth every 8 (eight) hours as needed for pain.   ALPRAZolam (XANAX) 0.25 MG tablet Take 0.125-0.25 mg by mouth at bedtime as needed for anxiety.    ANORO ELLIPTA 62.5-25 MCG/INH AEPB USE 1 INHALATION ONCE DAILY.   atorvastatin (LIPITOR) 40 MG tablet TAKE ONE TABLET BY MOUTH ONCE DAILY.   baclofen (LIORESAL) 10 MG tablet Take 10 mg by mouth as needed for muscle spasms.   docusate sodium (COLACE) 100 MG capsule Take 100 mg by mouth every evening.    donepezil (ARICEPT) 10 MG tablet TAKE ONE TABLET BY MOUTH ONCE DAILY. (Patient taking differently: Take 10 mg by mouth at bedtime.)   ergocalciferol (VITAMIN D2) 50000 UNITS capsule Take 50,000 Units by mouth every Tuesday.  ferrous sulfate 325 (65 FE) MG EC tablet Take 325 mg by mouth daily.    furosemide (LASIX) 40 MG tablet TAKE ONE TABLET BY MOUTH ONCE DAILY.   HYDROcodone-acetaminophen (NORCO) 7.5-325 MG tablet Take 1 tablet by mouth every 6 (six) hours as needed for moderate pain.   hydrocortisone (ANUSOL-HC) 2.5 % rectal cream Place 1 application rectally 3 (three) times daily as needed for hemorrhoids or anal itching.   levalbuterol (XOPENEX) 0.63 MG/3ML nebulizer solution USE 1 VIAL VIA NEBULIZER EVERY 8 HOURS AS NEEDED.   meclizine (ANTIVERT) 25 MG tablet Take 25 mg by mouth 3 (three) times daily as needed for dizziness.   Melatonin 3 MG TABS Take 1 tablet by mouth at bedtime.   methylPREDNISolone (MEDROL) 4 MG tablet Take 4 mg by mouth 2 (two) times daily as needed.   multivitamin-lutein (OCUVITE-LUTEIN) CAPS capsule Take 1 capsule by mouth daily.   ondansetron (ZOFRAN) 4 MG tablet Take 4 mg by mouth every 8 (eight) hours as needed for nausea or vomiting.   polyethylene glycol (MIRALAX / GLYCOLAX) 17 g packet Take 17 g by mouth daily as needed for moderate constipation.   pregabalin (LYRICA)  75 MG capsule TAKE 1 CAPSULE BY MOUTH EVERY OTHER DAY.   spironolactone (ALDACTONE) 25 MG tablet Take 1 tablet (25 mg total) by mouth once for 1 dose. Keep upcoming appointment for future refills. (Patient taking differently: Take 25 mg by mouth once. Keep upcoming appointment for future refills. Takes half a tablet in AM and PM)   traZODone (DESYREL) 50 MG tablet Take 0.5-1 tablets (25-50 mg total) by mouth at bedtime as needed for sleep.   warfarin (COUMADIN) 5 MG tablet TAKE 1/2 TO 1 TABLET ONCE DAILY AS DIRECTED BY COUMADIN CLINIC.   zinc sulfate 50 MG CAPS capsule Take 50 mg by mouth 2 (two) times daily.    No facility-administered encounter medications on file as of 01/14/2022.   Thank you for the opportunity to participate in the care of [name].  The palliative care team will continue to follow. Please call our office at [phone] if we can be of additional assistance.   Lorenza Burton, RN ,   COVID-19 PATIENT SCREENING TOOL Asked and negative response unless otherwise noted:  Have you had symptoms of covid, tested positive or been in contact with someone with symptoms/positive test in the past 5-10 days? No

## 2022-01-20 ENCOUNTER — Encounter (INDEPENDENT_AMBULATORY_CARE_PROVIDER_SITE_OTHER): Payer: Medicare Other | Admitting: Ophthalmology

## 2022-01-21 ENCOUNTER — Encounter (INDEPENDENT_AMBULATORY_CARE_PROVIDER_SITE_OTHER): Payer: Self-pay | Admitting: Ophthalmology

## 2022-01-21 ENCOUNTER — Ambulatory Visit (INDEPENDENT_AMBULATORY_CARE_PROVIDER_SITE_OTHER): Payer: Medicare Other | Admitting: Ophthalmology

## 2022-01-21 ENCOUNTER — Other Ambulatory Visit: Payer: Self-pay

## 2022-01-21 DIAGNOSIS — H353212 Exudative age-related macular degeneration, right eye, with inactive choroidal neovascularization: Secondary | ICD-10-CM

## 2022-01-21 DIAGNOSIS — H353221 Exudative age-related macular degeneration, left eye, with active choroidal neovascularization: Secondary | ICD-10-CM

## 2022-01-21 MED ORDER — AFLIBERCEPT 2MG/0.05ML IZ SOLN FOR KALEIDOSCOPE
2.0000 mg | INTRAVITREAL | Status: AC | PRN
  Administered 2022-01-21: 15:00:00 2 mg via INTRAVITREAL

## 2022-01-21 NOTE — Progress Notes (Signed)
01/21/2022     CHIEF COMPLAINT Patient presents for  Chief Complaint  Patient presents with   Macular Degeneration      HISTORY OF PRESENT ILLNESS: [name] is a 88 y.o. female who presents to the clinic today for:   HPI   9 weeks for dilate OS EYLEA OCT. No vision changes and no changes in medical history. Vitamin D3 1x a week instead of everyday. OTC eye drops for dry eyes. Pt denies floaters or FOL; Floaters appear only after Injection but quickly goes away.   Last edited by Angeline Slim on 01/21/2022  2:05 PM.      Referring physician: Benita Stabile, MD [address] [place],  [place] [address]  HISTORICAL INFORMATION:   Selected notes from the MEDICAL RECORD NUMBER    Lab Results  Component Value Date   HGBA1C 5.7 (H) 10/27/2019     CURRENT MEDICATIONS: No current outpatient medications on file. (Ophthalmic Drugs)   No current facility-administered medications for this visit. (Ophthalmic Drugs)   Current Outpatient Medications (Other)  Medication Sig   acetaminophen (TYLENOL) 650 MG CR tablet Take 650 mg by mouth every 8 (eight) hours as needed for pain.   ALPRAZolam (XANAX) 0.25 MG tablet Take 0.125-0.25 mg by mouth at bedtime as needed for anxiety.    ANORO ELLIPTA 62.5-25 MCG/INH AEPB USE 1 INHALATION ONCE DAILY.   atorvastatin (LIPITOR) 40 MG tablet TAKE ONE TABLET BY MOUTH ONCE DAILY.   baclofen (LIORESAL) 10 MG tablet Take 10 mg by mouth as needed for muscle spasms.   docusate sodium (COLACE) 100 MG capsule Take 100 mg by mouth every evening.    donepezil (ARICEPT) 10 MG tablet TAKE ONE TABLET BY MOUTH ONCE DAILY. (Patient taking differently: Take 10 mg by mouth at bedtime.)   ergocalciferol (VITAMIN D2) 50000 UNITS capsule Take 50,000 Units by mouth every Tuesday.   ferrous sulfate 325 (65 FE) MG EC tablet Take 325 mg by mouth daily.    furosemide (LASIX) 40 MG tablet TAKE ONE TABLET BY MOUTH ONCE DAILY.   HYDROcodone-acetaminophen (NORCO)  7.5-325 MG tablet Take 1 tablet by mouth every 6 (six) hours as needed for moderate pain.   hydrocortisone (ANUSOL-HC) 2.5 % rectal cream Place 1 application rectally 3 (three) times daily as needed for hemorrhoids or anal itching.   levalbuterol (XOPENEX) 0.63 MG/3ML nebulizer solution USE 1 VIAL VIA NEBULIZER EVERY 8 HOURS AS NEEDED.   meclizine (ANTIVERT) 25 MG tablet Take 25 mg by mouth 3 (three) times daily as needed for dizziness.   Melatonin 3 MG TABS Take 1 tablet by mouth at bedtime.   methylPREDNISolone (MEDROL) 4 MG tablet Take 4 mg by mouth 2 (two) times daily as needed.   multivitamin-lutein (OCUVITE-LUTEIN) CAPS capsule Take 1 capsule by mouth daily.   ondansetron (ZOFRAN) 4 MG tablet Take 4 mg by mouth every 8 (eight) hours as needed for nausea or vomiting.   polyethylene glycol (MIRALAX / GLYCOLAX) 17 g packet Take 17 g by mouth daily as needed for moderate constipation.   pregabalin (LYRICA) 75 MG capsule TAKE 1 CAPSULE BY MOUTH EVERY OTHER DAY.   spironolactone (ALDACTONE) 25 MG tablet Take 1 tablet (25 mg total) by mouth once for 1 dose. Keep upcoming appointment for future refills. (Patient taking differently: Take 25 mg by mouth once. Keep upcoming appointment for future refills. Takes half a tablet in AM and PM)   traZODone (DESYREL) 50 MG tablet Take 0.5-1 tablets (  25-50 mg total) by mouth at bedtime as needed for sleep.   warfarin (COUMADIN) 5 MG tablet TAKE 1/2 TO 1 TABLET ONCE DAILY AS DIRECTED BY COUMADIN CLINIC.   zinc sulfate 50 MG CAPS capsule Take 50 mg by mouth 2 (two) times daily.    No current facility-administered medications for this visit. (Other)      REVIEW OF SYSTEMS: ROS   Negative for: Constitutional, Gastrointestinal, Neurological, Skin, Genitourinary, Musculoskeletal, HENT, Endocrine, Cardiovascular, Eyes, Respiratory, Psychiatric, Allergic/Imm, Heme/Lymph Last edited by Angeline Slim on 01/21/2022  2:05 PM.       ALLERGIES Allergies  Allergen  Reactions   Codeine Nausea And Vomiting   Penicillins Other (See Comments)    Unknown reaction - listed on MAR Has patient had a PCN reaction causing immediate rash, facial/tongue/throat swelling, SOB or lightheadedness with hypotension: Unknown Has patient had a PCN reaction causing severe rash involving mucus membranes or skin necrosis: Unknown Has patient had a PCN reaction that required hospitalization: Unknown Has patient had a PCN reaction occurring within the last 10 years: Unknown If all of the above answers are "NO", then may proceed with Cephalosporin use.    PAST MEDICAL HISTORY Past Medical History:  Diagnosis Date   Anemia    Anxiety    Arthritis    Blood transfusion without reported diagnosis    Carotid artery stenosis    Without infarction   Cataract    Chronic combined systolic (congestive) and diastolic (congestive) heart failure (HCC)    COPD (chronic obstructive pulmonary disease) (HCC)    Wears 02 at home 2 L   Coronary artery disease    Heart valve replaced by other means    Hypercholesterolemia    Pure   Hypertension    Unspecified   LBBB (left bundle branch block)    Macular degeneration (senile) of retina, unspecified    On home O2    Postsurgical aortocoronary bypass status    Stroke (HCC)    Transient global amnesia    Unspecified hereditary and idiopathic peripheral neuropathy    Unspecified vitamin D deficiency    Past Surgical History:  Procedure Laterality Date   AORTIC VALVE REPLACEMENT     APPENDECTOMY     CORONARY ARTERY BYPASS GRAFT     ERCP N/A 09/13/2020   Procedure: ENDOSCOPIC RETROGRADE CHOLANGIOPANCREATOGRAPHY (ERCP);  Surgeon: Rachael Fee, MD;  Location: [facility] ENDOSCOPY;  Service: Endoscopy;  Laterality: N/A;   HIP PINNING,CANNULATED Right 07/18/2018   Procedure: RIGHT CANNULATED HIP PINNING;  Surgeon: Myrene Galas, MD;  Location: [facility] OR;  Service: Orthopedics;  Laterality: Right;   REMOVAL OF STONES  09/13/2020   Procedure:  REMOVAL OF STONES;  Surgeon: Rachael Fee, MD;  Location: [facility] ENDOSCOPY;  Service: Endoscopy;;   SPHINCTEROTOMY  09/13/2020   Procedure: Dennison Mascot;  Surgeon: Rachael Fee, MD;  Location: [facility] ENDOSCOPY;  Service: Endoscopy;;   TONSILLECTOMY      FAMILY HISTORY Family History  Problem Relation Age of Onset   Heart attack Father    Heart disease Father    Stroke Father    Stroke Sister    Hypertension Mother    Stroke Mother    Neuropathy Brother    COPD Sister     SOCIAL HISTORY Social History   Tobacco Use   Smoking status: Former    Packs/day: 0.50    Types: Cigarettes    Quit date: 11/30/1991    Years since quitting: 30.1   Smokeless tobacco: Never  Tobacco comments:    Tobacco use-no  Substance Use Topics   Alcohol use: No   Drug use: No         OPHTHALMIC EXAM:  Base Eye Exam     Visual Acuity (ETDRS)       Right Left   Dist [place] 20/80 -1 20/100 -1   Dist ph cc 20/60 -2 20/80 -2    Correction: Glasses         Tonometry (Tonopen, 2:16 PM)       Right Left   Pressure 13 17         Pupils       Pupils Dark Light Shape React APD   Right PERRL 2 1 Round Minimal None   Left PERRL 2 1 Round Minimal None         Visual Fields       Left Right    Full Full         Extraocular Movement       Right Left    Full Full         Neuro/Psych     Oriented x3: Yes   Mood/Affect: Normal         Dilation     Both eyes: 1.0% Mydriacyl, 2.5% Phenylephrine @ 2:16 PM           Slit Lamp and Fundus Exam     External Exam       Right Left   External Normal Normal         Slit Lamp Exam       Right Left   Lids/Lashes Normal Normal   Conjunctiva/Sclera White and quiet White and quiet   Cornea Clear Clear   Anterior Chamber Deep and quiet Deep and quiet   Iris Round and reactive,pinhole Round and reactive   Lens Clear Centered posterior chamber intraocular lens   Anterior Vitreous Normal Normal          Fundus Exam       Right Left   Posterior Vitreous  Posterior vitreous detachment, Central vitreous floaters   Disc  Normal, collateral vessels on the nerve   C/D Ratio  0.35   Macula  No Subretinal hemorrhage, no macular thickening, Soft drusen, Geographic atrophy   Vessels  Normal   Periphery  Normal            IMAGING AND PROCEDURES  Imaging and Procedures for 01/21/22  OCT, Retina - OU - Both Eyes       Right Eye Quality was good. Scan locations included subfoveal. Central Foveal Thickness: 238. Progression has been stable. Findings include abnormal foveal contour, retinal drusen .   Left Eye Quality was good. Scan locations included subfoveal. Central Foveal Thickness: 249. Progression has been stable. Findings include abnormal foveal contour, retinal drusen .   Notes OD, stable overall  OS with much improved perifoveal wet AMD overall post Eylea, 9-week follow-up in the past, slightly worse at 10-week interval in the past, much improved since Feb, 2022, repeat injection today maintain interval of 9 weeks     Intravitreal Injection, Pharmacologic Agent - OS - Left Eye       Time Out 01/21/2022. 3:02 PM. Confirmed correct patient, procedure, site, and patient consented.   Anesthesia Topical anesthesia was used. Anesthetic medications included Lidocaine 4%.   Procedure Preparation included 5% betadine to ocular surface, 10% betadine to eyelids, Tobramycin 0.3%. A 30 gauge needle was used.   Injection: 2 mg  aflibercept 2 MG/0.05ML   Route: Intravitreal, Site: Left Eye   NDC: [id number], Lot: [id number], Waste: 0 mL   Post-op Post injection exam found visual acuity of at least counting fingers. The patient tolerated the procedure well. There were no complications. The patient received written and verbal post procedure care education. Post injection medications included ocuflox.              ASSESSMENT/PLAN:  Exudative age-related macular degeneration of  left eye with active choroidal neovascularization (HCC) OS, much less active disease maintained at 9-week interval on Eylea.  Comparison to March 2022 when at 10 weeks, portion of the disease became active again nasal to FAZ OS.  We will repeat injection today at 9 weeks and maintain this in all 4 some months to calm  Exudative age-related macular degeneration of right eye with inactive choroidal neovascularization (HCC) No sign of recurrence of CNVM OD     ICD-10-CM   1. Exudative age-related macular degeneration of left eye with active choroidal neovascularization (HCC)  H35.3221 OCT, Retina - OU - Both Eyes    Intravitreal Injection, Pharmacologic Agent - OS - Left Eye    aflibercept (EYLEA) SOLN 2 mg    2. 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  OS today maintain excellent appearance on OCT and stable acuity at 9-week interval post Eylea injection.  Repeat injection Eylea today and examination next again in 9 weeks  2.  Dilate OU next  3.  Ophthalmic Meds Ordered this visit:  Meds ordered this encounter  Medications   aflibercept (EYLEA) SOLN 2 mg       Return in about 9 weeks (around 03/25/2022) for dilate, OS, EYLEA OCT.  There are no Patient Instructions on file for this visit.   Explained the diagnoses, plan, and follow up with the patient and they expressed understanding.  Patient expressed understanding of the importance of proper follow up care.   Alford HighlandGary A. Tadhg Eskew M.D. Diseases & Surgery of the Retina and Vitreous [facility] 01/21/22     Abbreviations: M myopia (nearsighted); A astigmatism; H hyperopia (farsighted); P presbyopia; Mrx spectacle prescription;  CTL contact lenses; OD right eye; OS left eye; OU both eyes  XT exotropia; ET esotropia; PEK punctate epithelial keratitis; PEE punctate epithelial erosions; DES dry eye syndrome; MGD meibomian gland  dysfunction; ATs artificial tears; PFAT's preservative free artificial tears; NSC nuclear sclerotic cataract; PSC posterior subcapsular cataract; ERM epi-retinal membrane; PVD posterior vitreous detachment; RD retinal detachment; DM diabetes mellitus; DR diabetic retinopathy; NPDR non-proliferative diabetic retinopathy; PDR proliferative diabetic retinopathy; CSME clinically significant macular edema; DME diabetic macular edema; dbh dot blot hemorrhages; CWS cotton wool spot; POAG primary open angle glaucoma; C/D cup-to-disc ratio; HVF humphrey visual field; GVF goldmann visual field; OCT optical coherence tomography; IOP intraocular pressure; BRVO Branch retinal vein occlusion; CRVO central retinal vein occlusion; CRAO central retinal artery occlusion; BRAO branch retinal artery occlusion; RT retinal tear; SB scleral buckle; PPV pars plana vitrectomy; VH Vitreous hemorrhage; PRP panretinal laser photocoagulation; IVK intravitreal kenalog; VMT vitreomacular traction; MH Macular hole;  NVD neovascularization of the disc; NVE neovascularization elsewhere; AREDS age related eye disease study; ARMD age related macular degeneration; POAG primary open angle glaucoma; EBMD epithelial/anterior basement membrane dystrophy; ACIOL anterior chamber intraocular lens; IOL intraocular lens; PCIOL posterior chamber intraocular lens; Phaco/IOL phacoemulsification with intraocular lens placement; PRK photorefractive keratectomy; LASIK laser assisted in situ  keratomileusis; HTN hypertension; DM diabetes mellitus; COPD chronic obstructive pulmonary disease

## 2022-01-21 NOTE — Assessment & Plan Note (Signed)
No sign of recurrence of CNVM OD 

## 2022-01-21 NOTE — Assessment & Plan Note (Signed)
OS, much less active disease maintained at 9-week interval on Eylea.  Comparison to March 2022 when at 10 weeks, portion of the disease became active again nasal to FAZ OS.  We will repeat injection today at 9 weeks and maintain this in all 4 some months to calm ?

## 2022-01-25 ENCOUNTER — Telehealth: Payer: Self-pay

## 2022-01-25 NOTE — Telephone Encounter (Signed)
340 pm.  Phone call made to patient to follow up on new mattress.  Patient states she did receive this.  She is uncertain if she likes the new mattress yet and advised she would try it out a few more days. Patient states she will notify me if the mattress will not work for her.  ?

## 2022-01-26 ENCOUNTER — Ambulatory Visit (INDEPENDENT_AMBULATORY_CARE_PROVIDER_SITE_OTHER): Payer: Medicare Other | Admitting: Cardiology

## 2022-01-26 DIAGNOSIS — Z5181 Encounter for therapeutic drug level monitoring: Secondary | ICD-10-CM

## 2022-01-26 LAB — POCT INR: INR: 3 (ref 2.0–3.0)

## 2022-01-26 NOTE — Patient Instructions (Signed)
Description   ?Called and spoke to pt' daughter, instructed for pt to continue to take warfarin 1/2 a tablet daily EXCEPT for 1 tablet on Monday, Wednesday and Friday.  Recheck INR in 2 weeks.  ? ?*PLEASE talk to daughter [name] for dosing instructions* ?*Home meter, checks every 2 weeks* ?  ? ? ?

## 2022-02-04 ENCOUNTER — Other Ambulatory Visit: Payer: Medicare Other

## 2022-02-04 ENCOUNTER — Other Ambulatory Visit: Payer: Self-pay

## 2022-02-04 VITALS — BP 112/48 | HR 62 | Temp 97.8°F | Resp 20 | Wt 120.0 lb

## 2022-02-04 DIAGNOSIS — Z515 Encounter for palliative care: Secondary | ICD-10-CM

## 2022-02-04 NOTE — Progress Notes (Signed)
COMMUNITY PALLIATIVE CARE SW NOTE ? ?PATIENT NAME: [name] ?DOB: 06/06/1926 ?MRN: [id number] ? ?PRIMARY CARE PROVIDER: Celene Squibb, MD ? ?RESPONSIBLE PARTY:  ?Acct ID - Guarantor Home Phone Work Phone Relationship Acct Type  ?[id number] MARILIN, [phone]  Self P/F  ?   [place], [place], [place] [address]  ? ? ? ?PLAN OF CARE and INTERVENTIONS:            ? ?GOALS OF CARE/ ADVANCE CARE PLANNING: Goals include to maximize quality of life and symptom management. Our advance care planning conversation included a discussion about:    ?The value and importance of advance care planning  ?Review and updating or creation of an advance directive document.  ? Patient is a DNR. Patients daughter, [name], is POA ?  ?Palliative care encounter: Palliative medicine team will continue to support patient, patient's family, and medical team. Visit consisted of counseling and education dealing with the complex and emotionally intense issues of symptom management and palliative care in the setting of serious and potentially life-threatening illness. ? ?SW and RN met with patient and patient's live-in caregiver, [name], in patient's home for routine monthly visit. Patient lives in a one story home. Patient has chronic COPD and respiratory failure. ? ?Functional changes/updates: Patient has not had any functional changes since his recent PC visit. Patient was sitting in recliner during visit. Patient denies recent falls. Fall and safety precautions discussed. Patient has SPC and RW to assist with ambulation if needed.  Patient continues to be independent with all ADL's. Patient continues to go out daily with caregiver and visit the gym. Caregiver shares that patients short term memory continues to worsen. ? ?Home & Environment assessment: No concerns. Patient feels safe in her home environment. Patients home is one level. Patient is able to maneuver around her home without issue.  ? ?Protein calorie  malnutrition/weight loss/Appetite: Patient's appetite is good.  ? Patient denies concerns with sleeping. Patient has new mattress for hospital bed, but has not felt/seen a difference in sleeping patterns. ? ?Psychosocial assessment: completed. No additional physical needs identified at this time. Ongoing support/resources will continued to be offered if needed.  ? ?Military hx: N/A  ? ?Medical assessment: RN reviewed medications and took vitals.  ? ?Hospice discussion: N/A ? ?SW discussed goals, reviewed care plan, provided emotional support, used active and reflective listening in the form of reciprocity emotional response. Questions and concerns were addressed. The patient/family was encouraged to call with any additional questions and/or concerns. PC Provided general support and encouragement, no other unmet needs identified. Will continue to follow. ? ?3.         PATIENT/CAREGIVER EDUCATION/ COPING:   ?Appearance: well groomed, appropriate given situation  ?Mental Status: Alert/oriented. ?Eye Contact: Good. Able to engage in proper eye contact  ?Thought Process: rational  ?Thought Content: not assessed  ?Speech: Normal rate, volume, tone  ?Mood: Normal and calm ?Affect: Congruent to endorsed mood, full ranging ?Insight: normal ?Judgement: normal  ?Interaction Style: Cooperative ? ?Patient A&O, patient engaged in fluent conversation and answered all questions appropriately. Patients mood was jovial during visit today. PHQ 9 is 0. Patient continues with active lifestyle of going to the gym and riding the stationary bike, going out to lunch daily and going on leisure outings. Patient A&O x3, Pt is HOH but able to answer questions appropriately. Patient denies any anxiety or depression, patient takes Xanax HS. Patients family is supportive. ? ?4.  PERSONAL EMERGENCY PLAN:  Patient will call 9-1-1 for emergencies.   ? ?5.         COMMUNITY RESOURCES COORDINATION/ HEALTH CARE NAVIGATION:  Patient and children  manages her care. ? ?6.         FINANCIAL CONCERNS/NEEDS: None. ?                       Primary Health Insurance: Medicare ?Secondary Health Insurance: N/A ?Prescription Coverage: Yes, no history of difficulty obtaining or affording prescriptions reported ?   ? ?SOCIAL HX:  ?Social History  ? ?Tobacco Use  ? Smoking status: Former  ?  Packs/day: 0.50  ?  Types: Cigarettes  ?  Quit date: 11/30/1991  ?  Years since quitting: 30.2  ? Smokeless tobacco: Never  ? Tobacco comments:  ?  Tobacco use-no  ?Substance Use Topics  ? Alcohol use: No  ? ? ?CODE STATUS: DNR ?ADVANCED DIRECTIVES: Y ?MOST FORM COMPLETE:  N ?HOSPICE EDUCATION PROVIDED: N ? ?PPS: Patient is INDP with all ADL's at this time. Patient is A&O  with fair-good insight and judgement.  ? ? ?Time spent: 39mn  ? ? ?ADoreene Eland LCSW ? ?

## 2022-02-04 NOTE — Progress Notes (Signed)
PATIENT NAME: [name] ?DOB: 1926-01-11 ?MRN: [id number] ? ?PRIMARY CARE PROVIDER: Benita Stabile, MD ? ?RESPONSIBLE PARTY:  ?Acct ID - Guarantor Home Phone Work Phone Relationship Acct Type  ?[id number] [name], [name]* [phone]  Self P/F  ?   [address], [place], [place] [address]  ? ? ?PLAN OF CARE and INTERVENTIONS: ?              1.  GOALS OF CARE/ ADVANCE CARE PLANNING:  Remain home under the care of her private caregiver.  Avoid hospitalizations.  ?              2.  PATIENT/CAREGIVER EDUCATION:  Exercise and Safety ?              4. PERSONAL EMERGENCY PLAN:  Activate 911 for emergencies.  ?              5.  DISEASE STATUS: ? ?INR: Continues to have checks completed at home.  Caregiver [name] denies any new concerns with machine.   ? ?Memory: Caregiver reports ongoing decline with short term memory.  Forgetfulness is noted today.   ? ?Mobility:  Patient is ambulatory with one person assistance and a cane.  No falls reported.   Continues to go to the gym 2-3 x a week but mostly for socialization.  Encouraged exercise to maintain mobility and strength.  Continue to re-enforce safety.  ? ?Shortness of Breath:  Patient endorses shortness of breath with exertion but this has been her baseline.  Continues with O2 4 L via [place].  Patient has a concentrator in the home and utilizes a small portable tank when out.  ? ?Weight Gain:  Up to 120 lbs yesterday.  Extra lasix given.  Has not been weighted today.  Gradual increase in weight over the last 4 months.  Patient believes this is true weight gain vs edema.  ? ? ?HISTORY OF PRESENT ILLNESS:   [name] is a 88 y.o. 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   Patient is being followed by Palliative Care every 4-8 weeks and PRN.  ? ?CODE STATUS: DNR ?ADVANCED DIRECTIVES: No ?MOST FORM: No ?PPS: 50% ? ? ?PHYSICAL EXAM:  ? ?VITALS: ?Today's Vitals  ? 02/04/22 1012  ?BP: (!) 112/48  ?Pulse: 62  ?Resp: 20  ?Temp: 97.8 ?F (36.6 ?C)  ?SpO2: 96%  ?Weight: 120 lb (54.4 kg)  ?  ?LUNGS: clear to auscultation  ?CARDIAC: Cor RRR ?EXTREMITIES: - for edema ?SKIN: Skin color, texture, turgor normal. No rashes or lesions or mobility and turgor normal  ?NEURO: positive for gait problems and memory problems ? ? ? ? ? ? ?Truitt Merle, RN ? ?

## 2022-02-09 ENCOUNTER — Ambulatory Visit (INDEPENDENT_AMBULATORY_CARE_PROVIDER_SITE_OTHER): Payer: Medicare Other | Admitting: Cardiology

## 2022-02-09 DIAGNOSIS — Z5181 Encounter for therapeutic drug level monitoring: Secondary | ICD-10-CM

## 2022-02-09 LAB — POCT INR: INR: 2.7 (ref 2.0–3.0)

## 2022-02-09 NOTE — Patient Instructions (Signed)
Description   ?Called and spoke to pt' daughter, instructed for pt to continue to take warfarin 1/2 a tablet daily EXCEPT for 1 tablet on Monday, Wednesday and Friday.  Recheck INR in 2 weeks.  ? ?*PLEASE talk to daughter [name] for dosing instructions* ?*Home meter, checks every 2 weeks* ?  ? ? ?

## 2022-02-20 ENCOUNTER — Other Ambulatory Visit: Payer: Self-pay | Admitting: Internal Medicine

## 2022-02-20 DIAGNOSIS — J449 Chronic obstructive pulmonary disease, unspecified: Secondary | ICD-10-CM

## 2022-02-23 ENCOUNTER — Ambulatory Visit (INDEPENDENT_AMBULATORY_CARE_PROVIDER_SITE_OTHER): Payer: Medicare Other | Admitting: Pharmacist Clinician (PhC)/ Clinical Pharmacy Specialist

## 2022-02-23 ENCOUNTER — Other Ambulatory Visit: Payer: Medicare Other | Admitting: Nurse Practitioner

## 2022-02-23 DIAGNOSIS — I359 Nonrheumatic aortic valve disorder, unspecified: Secondary | ICD-10-CM | POA: Diagnosis not present

## 2022-02-23 DIAGNOSIS — I6350 Cerebral infarction due to unspecified occlusion or stenosis of unspecified cerebral artery: Secondary | ICD-10-CM

## 2022-02-23 DIAGNOSIS — Z5181 Encounter for therapeutic drug level monitoring: Secondary | ICD-10-CM

## 2022-02-23 DIAGNOSIS — Z952 Presence of prosthetic heart valve: Secondary | ICD-10-CM | POA: Diagnosis not present

## 2022-02-23 DIAGNOSIS — Z7901 Long term (current) use of anticoagulants: Secondary | ICD-10-CM

## 2022-02-23 LAB — POCT INR: INR: 2.2 (ref 2.0–3.0)

## 2022-02-24 ENCOUNTER — Other Ambulatory Visit: Payer: Medicare Other

## 2022-02-24 ENCOUNTER — Encounter: Payer: Self-pay | Admitting: Nurse Practitioner

## 2022-02-24 ENCOUNTER — Other Ambulatory Visit: Payer: Medicare Other | Admitting: Nurse Practitioner

## 2022-02-24 DIAGNOSIS — Z515 Encounter for palliative care: Secondary | ICD-10-CM

## 2022-02-24 DIAGNOSIS — I5040 Unspecified combined systolic (congestive) and diastolic (congestive) heart failure: Secondary | ICD-10-CM

## 2022-02-24 DIAGNOSIS — R5381 Other malaise: Secondary | ICD-10-CM

## 2022-02-24 DIAGNOSIS — R0602 Shortness of breath: Secondary | ICD-10-CM

## 2022-02-24 NOTE — Progress Notes (Signed)
COMMUNITY PALLIATIVE CARE SW NOTE ? ?PATIENT NAME: [name] ?DOB: [birth year] ?MRN: [id number] ? ?PRIMARY CARE PROVIDER: Celene Squibb, MD ? ?RESPONSIBLE PARTY:  ?Acct ID - Guarantor Home Phone Work Phone Relationship Acct Type  ?[id number] TALAYAH, [phone]  Self P/F  ?   [place], [place], [place] [address]  ? ? ? ?PLAN OF CARE and INTERVENTIONS:             ? Goals include to maximize quality of life and symptom management. Our advance care planning conversation included a discussion about:    ?The value and importance of advance care planning  ?Review and updating or creation of an advance directive document.  ?                                 Patient is a DNR. Patients daughter, [name], is POA ?  ?Palliative care encounter: Palliative medicine team will continue to support patient, patient's family, and medical team. Visit consisted of counseling and education dealing with the complex and emotionally intense issues of symptom management and palliative care in the setting of serious and potentially life-threatening illness. ?  ?SW met with patient and patient's live-in caregiver, [name], in patient's home for routine monthly visit. PC NP - C. Gusler present for visit via virtual platform. Patient lives in a one story home. Patient has chronic COPD and respiratory failure. ?  ?Functional changes/updates: Patient has not had any functional changes since his recent PC visit. However, caregiver shares that patient is becoming slower in movement and some days do not feel up to going on daily outings.  ? ?Patient was sitting in recliner during visit. Patient denies recent falls. Patient has SPC and RW to assist with ambulation if needed.  Patient continues to be independent with all ADL's. Patient continues to  try to go out daily with caregiver for lunch and visit the gym, mostly for socialization. Caregiver shares that patients short term memory continues to worsen. ?  ?Home & Environment  assessment: No concerns. Patient feels safe in her home environment. Patients home is one level. Patient is able to maneuver around her home without issue.  ?  ?Protein calorie malnutrition/weight loss/Appetite: Patient's appetite is good. Wt this morning 119lbs.  ? ? Patient denies concerns with sleeping. Patient has new mattress for hospital bed, patient shares this is now helping more with her sleeping. ?  ?Psychosocial assessment: completed. No additional physical needs identified at this time. Ongoing support/resources will continued to be offered if needed.  ?  ?Military hx: N/A  ?  ?Medical assessment: RN reviewed medications and took vitals.  ?  ?Hospice discussion: N/A ?  ?SW discussed goals, reviewed care plan, provided emotional support, used active and reflective listening in the form of reciprocity emotional response. Questions and concerns were addressed. The patient/family was encouraged to call with any additional questions and/or concerns. PC Provided general support and encouragement, no other unmet needs identified. Will continue to follow. ? ?Next PC visits scheduled for 5/4 @10am  with PC RN/SW. PC NP to visit in person 5/26 @10am . ?  ?3.         PATIENT/CAREGIVER EDUCATION/ COPING:   ?Appearance: well groomed, appropriate given situation  ?Mental Status: Alert/oriented. ?Eye Contact: Good. Able to engage in proper eye contact  ?Thought Process: rational  ?Thought Content: not assessed  ?Speech: Normal rate, volume, tone  ?Mood: Normal and calm ?  Affect: Congruent to endorsed mood, full ranging ?Insight: normal ?Judgement: normal  ?Interaction Style: Cooperative ?  ?Patient A&O, patient engaged in fluent conversation and answered all questions appropriately. Patients mood was jovial during visit today. PHQ 9 is 0. Patient continues with active lifestyle of going to the gym and riding the stationary bike, going out to lunch daily and going on leisure outings. Patient A&O x3, Pt is HOH but able to  answer questions appropriately. Patient denies any anxiety or depression, patient takes Xanax HS. Patients family is supportive. ?  ?4.         PERSONAL EMERGENCY PLAN:  Patient will call 9-1-1 for emergencies.   ?  ?5.         COMMUNITY RESOURCES COORDINATION/ HEALTH CARE NAVIGATION:  Patient and children manages her care. ?  ?6.         FINANCIAL CONCERNS/NEEDS: None. ?                       Primary Health Insurance: Medicare ?Secondary Health Insurance: N/A ?Prescription Coverage: Yes, no history of difficulty obtaining or affording prescriptions reported ?   ?   ? ?SOCIAL HX:  ?Social History  ? ?Tobacco Use  ? Smoking status: Former  ?  Packs/day: 0.50  ?  Types: Cigarettes  ?  Quit date: 11/30/1991  ?  Years since quitting: 30.2  ? Smokeless tobacco: Never  ? Tobacco comments:  ?  Tobacco use-no  ?Substance Use Topics  ? Alcohol use: No  ? ? ?CODE STATUS: DNR  ?ADVANCED DIRECTIVES: Y ?MOST FORM COMPLETE: Y ?HOSPICE EDUCATION PROVIDED: N ? ?PPS:N ? ?TIME SPENT 45 MIN ? ? ? ? ?Doreene Eland, LCSW ? ?

## 2022-02-24 NOTE — Progress Notes (Signed)
? ? ?Civil engineer, contracting ?Community Palliative Care Consult Note ?Telephone: [phone]  ?Fax: [phone]  ? ? ?Date of encounter: 02/24/22 ?11:25 AM ?PATIENT NAME: [name] ?[address] ?[place] [address]   ?[phone] (home)  ?DOB: 07/07/1926 ?MRN: [id number] ?PRIMARY CARE PROVIDER:    ?Colleen Stabile, MD,  ?[address] [address] ?[place] [address] ?[phone] ?RESPONSIBLE PARTY:    ?Contact Information   ? ? Name Relation Home Work Mobile  ? [name] Daughter [phone]  ? [name] Granddaughter [phone]  [phone]  ? [name] Daughter   [phone]  ? [name]   [phone]  ? ?  ? ?Due to the COVID-19 crisis, this visit was done via telemedicine from my office and it was initiated and consent by this patient and or family. ? ?I connected with [name] PC SW with [name] OR PROXY on 02/24/22 by a video enabled telemedicine application and verified that I am speaking with the correct person. ?  ?I discussed the limitations of evaluation and management by telemedicine. The patient expressed understanding and agreed to proceed. Palliative Care was asked to follow this patient by consultation request of  Colleen Stabile, MD to address advance care planning and complex medical decision making. This is a follow up visit.                                  ?ASSESSMENT AND PLAN / RECOMMENDATIONS:  ?Symptom Management/Plan: ?ACP: DNR; declined workup for trace of blood in stool; avoid hospitalizations ?  ?2.  Shortness of breath secondary to COPD, CHF, stable, continue monitoring weights, supplemental O2, We talked about increase use of nebulizer treatments this week. [name] endorses when this happens they increase diuretic for a day which improves symptoms. Continue current plan ?  ?3. Debility secondary to COPD, CHF, continue strengthening, mobility, encourage self training.  ?  ?11/04/2021 weight 113  lbs ?12/08/2021 weight 114 lbs ?12/29/2021 weight 120 lbs ?02/24/2022 weight 119 lbs ?  ?4. Palliative care encounter; Palliative care encounter; Palliative medicine team will continue to support patient, patient's family, and medical team. Visit consisted of counseling and education dealing with the complex and emotionally intense issues of symptom management and palliative care in the setting of serious and potentially life-threatening illness ? ?Follow up Palliative Care Visit: Palliative care will continue to follow for complex medical decision making, advance care planning, and clarification of goals. Return 6 weeks or prn. ? ?I spent 24 minutes providing this consultation. More than 50% of the time in this consultation was spent in counseling and care coordination. ? ?PPS: 50% ? ?HOSPICE ELIGIBILITY/DIAGNOSIS: TBD ? ?Chief Complaint: Follow up palliative consult for complex medical decision making ? ?HISTORY OF PRESENT ILLNESS:  [name] is a 88 y.o. 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 I connected with [name] SW [facility] for visual telemedicine visit with [name], caregiver with [name]. We talked about how [name] has been feeling. [name] endorses she has been doing good. She has required 3 breathing treatments this week which is more than usual, normally in the am. We talked about ros,  symptoms. We talked about her functional and cognitive abilities. We talked about daily routine, going to eat lunch, getting her hair done, going to the gym, socializing. No recent falls, wounds, infections, hospitalizations. We talked about appetite, with current weight 119 lbs. Medical goals  reviewed. Medical goals reviewed. We talked about f/u pc visit, scheduled. Therapeutic listening, emotional support provided, question answered. PC RN/SW will f/u in 3 weeks per daughter's request then [facility] NP in 6 weeks. Scheduled visits.  ? ?History obtained from review of EMR, discussion with Caregiver, [name] PC SW and [name].  ?I reviewed available labs, medications, imaging, studies and related documents from the EMR.  Records reviewed and summarized above.  ? ?ROS ?10 Simpson reviewed all negative except HPI ? ?Physical Exam: ?deferred ? ?Thank you for the opportunity to participate in the care of [name].  The palliative care team will continue to follow. Please call our office at [phone] if we can be of additional assistance.  ? ?Larue Lightner Z Leeanna Slaby, NP  ? ?

## 2022-03-09 ENCOUNTER — Ambulatory Visit (INDEPENDENT_AMBULATORY_CARE_PROVIDER_SITE_OTHER): Payer: Medicare Other | Admitting: Pharmacist Clinician (PhC)/ Clinical Pharmacy Specialist

## 2022-03-09 DIAGNOSIS — Z7901 Long term (current) use of anticoagulants: Secondary | ICD-10-CM | POA: Diagnosis not present

## 2022-03-09 DIAGNOSIS — Z5181 Encounter for therapeutic drug level monitoring: Secondary | ICD-10-CM

## 2022-03-09 DIAGNOSIS — I359 Nonrheumatic aortic valve disorder, unspecified: Secondary | ICD-10-CM | POA: Diagnosis not present

## 2022-03-09 DIAGNOSIS — Z952 Presence of prosthetic heart valve: Secondary | ICD-10-CM

## 2022-03-09 DIAGNOSIS — I6350 Cerebral infarction due to unspecified occlusion or stenosis of unspecified cerebral artery: Secondary | ICD-10-CM

## 2022-03-09 LAB — POCT INR: INR: 2.6 (ref 2.0–3.0)

## 2022-03-18 ENCOUNTER — Other Ambulatory Visit: Payer: Self-pay | Admitting: Cardiology

## 2022-03-18 ENCOUNTER — Other Ambulatory Visit: Payer: Medicare Other

## 2022-03-18 ENCOUNTER — Telehealth: Payer: Self-pay | Admitting: Cardiology

## 2022-03-18 VITALS — BP 110/46 | HR 68 | Temp 98.0°F | Wt 120.0 lb

## 2022-03-18 DIAGNOSIS — Z515 Encounter for palliative care: Secondary | ICD-10-CM

## 2022-03-18 MED ORDER — FUROSEMIDE 40 MG PO TABS: 60.0000 mg | ORAL_TABLET | Freq: Every day | ORAL | 3 refills | Status: DC

## 2022-03-18 NOTE — Telephone Encounter (Signed)
Spoke with julie, palliative care nurse, she saw the patient this morning and her sitter reports increased SOB with exertion. She also has been complaining of heaviness and tightness in her chest. Her weight is up from 113 lb in December to 120 lb today. They have given her extra furosemide for the last 3 days with no change. Her bp is running 110-112/46 and she is complaining of dizziness with standing. Bp standing is 120/46. [name] reports there may not be anything to do at this time but she was calling to let us know. Will forward to dr Jens Som to review   ?

## 2022-03-18 NOTE — Progress Notes (Signed)
COMMUNITY PALLIATIVE CARE SW NOTE ? ?PATIENT NAME: [name] ?DOB: 1926-08-17 ?MRN: [id number] ? ?PRIMARY CARE PROVIDER: Celene Squibb, MD ? ?RESPONSIBLE PARTY:  ?Acct ID - Guarantor Home Phone Work Phone Relationship Acct Type  ?[id number] MADILYNNE, MULLAN514 [phone]  Self P/F  ?   [place], [place], [place] [address]  ? ? ? ?PLAN OF CARE and INTERVENTIONS:             ?   GOALS OF CARE/ ADVANCE CARE PLANNING:  Goals include to maximize quality of life and symptom management. Our advance care planning conversation included a discussion about:    ?- The value and importance of advance care planning  ?- Review and updating or creation of an advance directive document.  ?                    Patient is a DNR. Patients daughter, [name], is POA ?  ?2.     Palliative care encounter: Palliative medicine team will continue to support patient, patient's family, and medical team. Visit consisted of counseling and education dealing with the complex and emotionally intense issues of symptom management and palliative care in the setting of serious and potentially life-threatening illness. ?  ?SW met with patient and patient's live-in caregiver, [name], in patient's home for routine monthly visit. Patient lives in a one story home. Patient has chronic COPD and respiratory failure. ?  ?Functional changes/updates: Patient has had slight functional change since his recent PC visit. Per patient is becoming slower in movement and some days do not feel up to going on daily outings. Patient states that she is having more chest tightness with exertion. Patient has taking additional lasix pills this week as well as breathing treatments. ? ?Caregiver shares that patient is becoming weaker.  ? ?Patient has complained of feeling light headed with standing periodically. No falls reported. Patient has SPC and RW to assist with ambulation if needed.  Patient continues to be independent with all ADL's.  ? ?Patient continues to  try to go  out daily with caregiver for lunch and visit the gym, mostly for socialization, however patient missed her hair appointment this morning, due to not feeling up to going. Caregiver shares that patients short term memory continues to worsen. ?  ?Home & Environment assessment: No concerns. Patient feels safe in her home environment. Patients home is one level. Patient is able to maneuver around her home without issue.  ?  ?Protein calorie malnutrition/weight loss/Appetite: Patient's appetite is good. Wt this morning 120lbs.  ?  ?Patient denies concerns with sleeping. Patient has new mattress for hospital bed, patient shares this is now helping more with her sleeping. ?  ?Psychosocial assessment: completed. No additional physical needs identified at this time. Ongoing support/resources will continued to be offered if needed.  ?  ?Military hx: N/A  ?  ?Medical assessment: RN reviewed medications and took vitals.  ?  ?Hospice discussion: N/A ?  ?SW discussed goals, reviewed care plan, provided emotional support, used active and reflective listening in the form of reciprocity emotional response. Questions and concerns were addressed. The patient/family was encouraged to call with any additional questions and/or concerns. PC Provided general support and encouragement, no other unmet needs identified. Will continue to follow. ?  ?Next PC with PC NP to visit in person 5/26 _0 . ?  ?3.         PATIENT/CAREGIVER EDUCATION/ COPING:   ?Appearance: well groomed, appropriate given situation  ?  Mental Status: Alert/oriented. ?Eye Contact: Good. Able to engage in proper eye contact  ?Thought Process: rational  ?Thought Content: not assessed  ?Speech: Normal rate, volume, tone  ?Mood: Normal and calm ?Affect: Congruent to endorsed mood, full ranging ?Insight: normal ?Judgement: normal  ?Interaction Style: Cooperative ?  ?Patient A&O, patient engaged in fluent conversation and answered all questions appropriately. Patients mood was  jovial during visit today. PHQ 9 is 0. Patient continues with active lifestyle of going to the gym and riding the stationary bike, going out to lunch daily and going on leisure outings. Patient A&O x3, Pt is HOH but able to answer questions appropriately. Patient denies any anxiety or depression, patient takes Xanax HS. Patients family is supportive. ?  ?4.         PERSONAL EMERGENCY PLAN:  Patient will call 9-1-1 for emergencies.   ?  ?5.         COMMUNITY RESOURCES COORDINATION/ HEALTH CARE NAVIGATION:  Patient and children manages her care. ?  ?6.         FINANCIAL CONCERNS/NEEDS: None. ?                       Primary Health Insurance: Medicare ?Secondary Health Insurance: N/A ?Prescription Coverage: Yes, no history of difficulty obtaining or affording prescriptions reported ?   ? ?SOCIAL HX:  ?Social History  ? ?Tobacco Use  ? Smoking status: Former  ?  Packs/day: 0.50  ?  Types: Cigarettes  ?  Quit date: 11/30/1991  ?  Years since quitting: 30.3  ? Smokeless tobacco: Never  ? Tobacco comments:  ?  Tobacco use-no  ?Substance Use Topics  ? Alcohol use: No  ? ? ?CODE STATUS: DNR ?ADVANCED DIRECTIVES: Y ?MOST FORM COMPLETE:  N ?HOSPICE EDUCATION PROVIDED: N ? ?PPS: Patient is independent with ADL's. ? ? ?Time spent: 50 min  ? ? ?Doreene Eland, LCSW ? ?

## 2022-03-18 NOTE — Telephone Encounter (Signed)
Spoke with julie, Aware of dr Ludwig Clarks recommendations.  ?New script sent to the pharmacy  ?She will draw the bmp in one week ?

## 2022-03-18 NOTE — Progress Notes (Signed)
PATIENT NAME: [name] ?DOB: 21-Jan-1926 ?MRN: [id number] ? ?PRIMARY CARE PROVIDER: Benita Stabile, MD ? ?RESPONSIBLE PARTY:  ?Acct ID - Guarantor Home Phone Work Phone Relationship Acct Type  ?[id number] [name]* [phone]  Self P/F  ?   [address], [place], [place] [address]  ? ? ?PLAN OF CARE and INTERVENTIONS: ?              1.  GOALS OF CARE/ ADVANCE CARE PLANNING:  Remain home under the care of her private caregiver [name] and daughter [name]. ?              2.  PATIENT/CAREGIVER EDUCATION:  Lasix dosing ?              4. PERSONAL EMERGENCY PLAN:  Activate 911 for emergencies.  ?              5.  DISEASE STATUS: ? ?Appetite:  No change in appetite.   ? ?Dementia:  Patient is having more issues with memory.  Unable to recall recent provider visits.  Repeating questions frequently on visits.  ? ?Mobility:  No recent falls. Has a cane and walker but not using consistently.  Caregiver assists patient with safe ambulation.  ? ?Shortness of breath:  Patient reports increase shortness of breath, dry cough,  occasional chest tightness but uncertain how often the tightness occurs.  Using nebulizer 3 x last week.  Will use again today.  Weight maintaining at 120 lbs this week.  Weight 12/22 113 lbs and trending upwards.  Patient has received extra 40 mg of lasix Monday-Thursday of this week but no change in weights. Continues to sleep with hospital bed,  head of bed elevated at 30 degrees with 2 pillows.  Phone call made to Dr. Ludwig Clarks office at 1027 am.  Awaiting call back. ? ?1148 am.  Incoming call from [name].  New orders are to give Lasix 40 mg bid for 3 days, then begin Lasix 60 mg daily, bmp in 1 week. ? ?340 pm. Phone call made to [name]-daughter with update on today's visit and new orders.  She has requested that I contact [name] the caregiver directly with new orders.  Spoke with [name] and update given on medication changes.  Will see patient next Wednesday for blood work as ordered by Dr.  Jens Som.  ? ?Update provided to Colleen Gusler, NP.  ? ? ? ? ?HISTORY OF PRESENT ILLNESS:  [name] is a 88 y.o. 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  Patient is followed by Palliative Care every 3-6 weeks and PRN.  ? ?CODE STATUS: DNR ?ADVANCED DIRECTIVES: No ?MOST FORM: No ?PPS: 50% ? ? ?PHYSICAL EXAM:  ? ?VITALS: ?Today's Vitals  ? 03/18/22 1014  ?BP: (!) 110/46  ?Pulse: 68  ?Temp: 98 ?F (36.7 ?C)  ?SpO2: 92%  ?Weight: 120 lb (54.4 kg)  ?  ?LUNGS: clear to auscultation  ?CARDIAC: Cor RRR}  ?EXTREMITIES: - for edema ?SKIN: Skin color, texture, turgor normal. No rashes or lesions or normal  ?NEURO: positive for dizziness, gait problems, and memory problems ? ? ? ? ? ? ?Truitt Merle, RN ? ?

## 2022-03-18 NOTE — Telephone Encounter (Signed)
Pt c/o Shortness Of Breath: STAT if SOB developed within the last 24 hours or pt is noticeably SOB on the phone ? ?1. Are you currently SOB (can you hear that pt is SOB on the phone)? No  ? ?2. How long have you been experiencing SOB? Progressively worse over past 2 weeks ? ?3. Are you SOB when sitting or when up moving around? Moving around or talking ? ?4. Are you currently experiencing any other symptoms? No; in past 24 hrs unsteadiness on feet, dizziness, and chest heaviness  ?

## 2022-03-19 ENCOUNTER — Other Ambulatory Visit: Payer: Self-pay | Admitting: Cardiology

## 2022-03-23 ENCOUNTER — Ambulatory Visit (INDEPENDENT_AMBULATORY_CARE_PROVIDER_SITE_OTHER): Payer: Medicare Other | Admitting: Pharmacist

## 2022-03-23 DIAGNOSIS — Z5181 Encounter for therapeutic drug level monitoring: Secondary | ICD-10-CM | POA: Diagnosis not present

## 2022-03-23 DIAGNOSIS — I6350 Cerebral infarction due to unspecified occlusion or stenosis of unspecified cerebral artery: Secondary | ICD-10-CM | POA: Diagnosis not present

## 2022-03-23 DIAGNOSIS — I359 Nonrheumatic aortic valve disorder, unspecified: Secondary | ICD-10-CM

## 2022-03-23 DIAGNOSIS — Z952 Presence of prosthetic heart valve: Secondary | ICD-10-CM

## 2022-03-23 LAB — POCT INR: INR: 2.4 (ref 2.0–3.0)

## 2022-03-23 NOTE — Patient Instructions (Signed)
Description   ?Called and spoke to pt' daughter, instructed for pt to continue to take warfarin 1/2 a tablet daily EXCEPT for 1 tablet on Monday, Wednesday and Friday.  Recheck INR in 2 weeks.  ? ?*PLEASE talk to daughter [name] for dosing instructions* ?*Home meter, checks every 2 weeks* ?  ? ? ?

## 2022-03-24 ENCOUNTER — Other Ambulatory Visit: Payer: Medicare Other

## 2022-03-24 VITALS — Wt 118.0 lb

## 2022-03-24 DIAGNOSIS — Z515 Encounter for palliative care: Secondary | ICD-10-CM

## 2022-03-24 NOTE — Progress Notes (Signed)
PATIENT NAME: [name] ?DOB: 19-Apr-1926 ?MRN: [id number] ? ?PRIMARY CARE PROVIDER: Celene Squibb, MD ? ?RESPONSIBLE PARTY:  ?Acct ID - Guarantor Home Phone Work Phone Relationship Acct Type  ?[id number] [name], [name]  Self P/F  ?   [place], [place], [place] [address]  ? ? ? ?Visit completed today for BMP as ordered by Dr. Stanford Breed.  Explained to patient purpose of visit and she is agreeable to proceed.  Left AC accessed for blood work.  Patient tolerated well.   ? ?Caregiver [name] advised they were both exposed to a child with strep throat.  We discussed symptoms of strep and when to contact PCP.  [name] will continue to monitor patient for symptoms.  ? ?Blood work taken to [facility] on [address] for processing.  Results will be faxed to Dr. Stanford Breed.  ? ? ? ? ?Lorenza Burton, RN ? ?

## 2022-03-25 ENCOUNTER — Encounter (INDEPENDENT_AMBULATORY_CARE_PROVIDER_SITE_OTHER): Payer: Self-pay | Admitting: Ophthalmology

## 2022-03-25 ENCOUNTER — Ambulatory Visit (INDEPENDENT_AMBULATORY_CARE_PROVIDER_SITE_OTHER): Payer: Medicare Other | Admitting: Ophthalmology

## 2022-03-25 DIAGNOSIS — H353221 Exudative age-related macular degeneration, left eye, with active choroidal neovascularization: Secondary | ICD-10-CM

## 2022-03-25 DIAGNOSIS — H353212 Exudative age-related macular degeneration, right eye, with inactive choroidal neovascularization: Secondary | ICD-10-CM

## 2022-03-25 DIAGNOSIS — H353124 Nonexudative age-related macular degeneration, left eye, advanced atrophic with subfoveal involvement: Secondary | ICD-10-CM | POA: Diagnosis not present

## 2022-03-25 MED ORDER — AFLIBERCEPT 2MG/0.05ML IZ SOLN FOR KALEIDOSCOPE
2.0000 mg | INTRAVITREAL | Status: AC | PRN
  Administered 2022-03-25: 2 mg via INTRAVITREAL

## 2022-03-25 NOTE — Assessment & Plan Note (Signed)
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

## 2022-03-25 NOTE — Assessment & Plan Note (Signed)
No sign of CNVM OD 

## 2022-03-25 NOTE — Assessment & Plan Note (Signed)
[facility] accounts for acuity 

## 2022-03-25 NOTE — Progress Notes (Signed)
? ? ?03/25/2022 ? ?  ? ?CHIEF COMPLAINT ?Patient presents for  ?Chief Complaint  ?Patient presents with  ? Macular Degeneration  ? ? ? ? ?HISTORY OF PRESENT ILLNESS: ?Colleen CablesRuth E Simpson is a 88 y.o. female who presents to the clinic today for:  ? ?HPI   ?9 weeks for DILATE OS, EYLEA OCT. ?Pt stated vision has worsened. ?Pt denies floaters and FOL. ? ?Last edited by [name], [name] on 03/25/2022  2:17 PM.  ?  ? ? ?Referring physician: ?[name], Colleen Z, MD ?[id number] ?[name] F ?[name],  [place] [id number] ? ?HISTORICAL INFORMATION:  ? ?Selected notes from the MEDICAL RECORD NUMBER ?  ? ?Lab Results  ?Component Value Date  ? HGBA1C 5.7 (H) 10/27/2019  ?  ? ?CURRENT MEDICATIONS: ?No current outpatient medications on file. (Ophthalmic Drugs)  ? ?No current facility-administered medications for this visit. (Ophthalmic Drugs)  ? ?Current Outpatient Medications (Other)  ?Medication Sig  ? acetaminophen (TYLENOL) 650 MG CR tablet Take 650 mg by mouth every 8 (eight) hours as needed for pain.  ? ALPRAZolam (XANAX) 0.25 MG tablet Take 0.125-0.25 mg by mouth at bedtime as needed for anxiety.   ? atorvastatin (LIPITOR) 40 MG tablet TAKE ONE TABLET BY MOUTH ONCE DAILY.  ? baclofen (LIORESAL) 10 MG tablet Take 10 mg by mouth as needed for muscle spasms.  ? docusate sodium (COLACE) 100 MG capsule Take 100 mg by mouth every evening.   ? donepezil (ARICEPT) 10 MG tablet TAKE ONE TABLET BY MOUTH ONCE DAILY. (Patient taking differently: Take 10 mg by mouth at bedtime.)  ? ergocalciferol (VITAMIN D2) 50000 UNITS capsule Take 50,000 Units by mouth every Tuesday.  ? ferrous sulfate 325 (65 FE) MG EC tablet Take 325 mg by mouth daily.   ? furosemide (LASIX) 40 MG tablet Take 1.5 tablets (60 mg total) by mouth daily.  ? HYDROcodone-acetaminophen (NORCO) 7.5-325 MG tablet Take 1 tablet by mouth every 6 (six) hours as needed for moderate pain.  ? hydrocortisone (ANUSOL-HC) 2.5 % rectal cream Place 1 application rectally 3 (three) times daily as needed for hemorrhoids  or anal itching.  ? levalbuterol (XOPENEX) 0.63 MG/3ML nebulizer solution USE 1 VIAL VIA NEBULIZER EVERY 8 HOURS AS NEEDED.  ? meclizine (ANTIVERT) 25 MG tablet Take 25 mg by mouth 3 (three) times daily as needed for dizziness.  ? Melatonin 3 MG TABS Take 1 tablet by mouth at bedtime.  ? methylPREDNISolone (MEDROL) 4 MG tablet Take 4 mg by mouth 2 (two) times daily as needed.  ? multivitamin-lutein (OCUVITE-LUTEIN) CAPS capsule Take 1 capsule by mouth daily.  ? ondansetron (ZOFRAN) 4 MG tablet Take 4 mg by mouth every 8 (eight) hours as needed for nausea or vomiting.  ? polyethylene glycol (MIRALAX / GLYCOLAX) 17 g packet Take 17 g by mouth daily as needed for moderate constipation.  ? pregabalin (LYRICA) 75 MG capsule TAKE 1 CAPSULE BY MOUTH EVERY OTHER DAY.  ? spironolactone (ALDACTONE) 25 MG tablet Take 1 tablet (25 mg total) by mouth once for 1 dose. Keep upcoming appointment for future refills. (Patient taking differently: Take 25 mg by mouth once. Keep upcoming appointment for future refills. Takes half a tablet in AM and PM)  ? traZODone (DESYREL) 50 MG tablet Take 0.5-1 tablets (25-50 mg total) by mouth at bedtime as needed for sleep.  ? umeclidinium-vilanterol (ANORO ELLIPTA) 62.5-25 MCG/ACT AEPB USE 1 INHALATION ONCE DAILY.  ? warfarin (COUMADIN) 5 MG tablet TAKE 1-2 TABLETS DAILY OR AS PRESCRIBED  BY COUMADIN CLINIC  ? zinc sulfate 50 MG CAPS capsule Take 50 mg by mouth 2 (two) times daily.   ? ?No current facility-administered medications for this visit. (Other)  ? ? ? ? ?REVIEW OF SYSTEMS: ?ROS   ?Negative for: Constitutional, Gastrointestinal, Neurological, Skin, Genitourinary, Musculoskeletal, HENT, Endocrine, Cardiovascular, Eyes, Respiratory, Psychiatric, Allergic/Imm, Heme/Lymph ?Last edited by Colleen Slim on 03/25/2022  2:17 PM.  ?  ? ? ? ?ALLERGIES ?Allergies  ?Allergen Reactions  ? Codeine Nausea And Vomiting  ? Penicillins Other (See Comments)  ?  Unknown reaction - listed on MAR ?Has patient had a  PCN reaction causing immediate rash, facial/tongue/throat swelling, SOB or lightheadedness with hypotension: Unknown ?Has patient had a PCN reaction causing severe rash involving mucus membranes or skin necrosis: Unknown ?Has patient had a PCN reaction that required hospitalization: Unknown ?Has patient had a PCN reaction occurring within the last 10 years: Unknown ?If all of the above answers are "NO", then may proceed with Cephalosporin use.  ? ? ?PAST MEDICAL HISTORY ?Past Medical History:  ?Diagnosis Date  ? Anemia   ? Anxiety   ? Arthritis   ? Blood transfusion without reported diagnosis   ? Carotid artery stenosis   ? Without infarction  ? Cataract   ? Chronic combined systolic (congestive) and diastolic (congestive) heart failure (HCC)   ? COPD (chronic obstructive pulmonary disease) (HCC)   ? Wears 02 at home 2 L  ? Coronary artery disease   ? Heart valve replaced by other means   ? Hypercholesterolemia   ? Pure  ? Hypertension   ? Unspecified  ? LBBB (left bundle branch block)   ? Macular degeneration (senile) of retina, unspecified   ? On home O2   ? Postsurgical aortocoronary bypass status   ? Stroke [facility])   ? Transient global amnesia   ? Unspecified hereditary and idiopathic peripheral neuropathy   ? Unspecified vitamin D deficiency   ? ?Past Surgical History:  ?Procedure Laterality Date  ? AORTIC VALVE REPLACEMENT    ? APPENDECTOMY    ? CORONARY ARTERY BYPASS GRAFT    ? ERCP N/A 09/13/2020  ? Procedure: ENDOSCOPIC RETROGRADE CHOLANGIOPANCREATOGRAPHY (ERCP);  Surgeon: Rachael Fee, MD;  Location[facility] ENDOSCOPY;  Service: Endoscopy;  Laterality: N/A;  ? HIP PINNING,CANNULATED Right 07/18/2018  ? Procedure: RIGHT CANNULATED HIP PINNING;  Surgeon: Myrene Galas, MD;  Location: [facility] OR;  Service: Orthopedics;  Laterality: Right;  ? REMOVAL OF STONES  09/13/2020  ? Procedure: REMOVAL OF STONES;  Surgeon: Rachael Fee, MD;  Location: [facility] ENDOSCOPY;  Service: Endoscopy;;  ? SPHINCTEROTOMY  09/13/2020  ?  Procedure: SPHINCTEROTOMY;  Surgeon: Rachael Fee, MD;  Location: [facility] ENDOSCOPY;  Service: Endoscopy;;  ? TONSILLECTOMY    ? ? ?FAMILY HISTORY ?Family History  ?Problem Relation Age of Onset  ? Heart attack Father   ? Heart disease Father   ? Stroke Father   ? Stroke Sister   ? Hypertension Mother   ? Stroke Mother   ? Neuropathy Brother   ? COPD Sister   ? ? ?SOCIAL HISTORY ?Social History  ? ?Tobacco Use  ? Smoking status: Former  ?  Packs/day: 0.50  ?  Types: Cigarettes  ?  Quit date: 11/30/1991  ?  Years since quitting: 30.3  ? Smokeless tobacco: Never  ? Tobacco comments:  ?  Tobacco use-no  ?Substance Use Topics  ? Alcohol use: No  ? Drug use: No  ? ?  ? ?  ? ?  OPHTHALMIC EXAM: ? ?Base Eye Exam   ? ? Visual Acuity (ETDRS)   ? ?   Right Left  ? Dist [place] 20/80 -1 20/150  ? Dist ph [place] 20/70 NI  ? ? Correction: Glasses  ? ?  ?  ? ? Tonometry (Tonopen, 2:24 PM)   ? ?   Right Left  ? Pressure 12 12  ? ?  ?  ? ? Pupils   ? ?   Pupils APD  ? Right PERRL None  ? Left PERRL None  ? ?  ?  ? ? Visual Fields   ? ?   Left Right  ?  Full Full  ? ?  ?  ? ? Extraocular Movement   ? ?   Right Left  ?  Full Full  ? ?  ?  ? ? Neuro/Psych   ? ? Oriented x3: Yes  ? Mood/Affect: Normal  ? ?  ?  ? ? Dilation   ? ? Left eye: 1.0% Mydriacyl, 2.5% Phenylephrine @ 2:24 PM  ? ?  ?  ? ?  ? ?Slit Lamp and Fundus Exam   ? ? External Exam   ? ?   Right Left  ? External Normal Normal  ? ?  ?  ? ? Slit Lamp Exam   ? ?   Right Left  ? Lids/Lashes Normal Normal  ? Conjunctiva/Sclera White and quiet White and quiet  ? Cornea Clear Clear  ? Anterior Chamber Deep and quiet Deep and quiet  ? Iris Round and reactive,pinhole Round and reactive  ? Lens Clear Centered posterior chamber intraocular lens  ? Anterior Vitreous Normal Normal  ? ?  ?  ? ? Fundus Exam   ? ?   Right Left  ? Posterior Vitreous  Posterior vitreous detachment, Central vitreous floaters  ? Disc  Normal, collateral vessels on the nerve  ? C/D Ratio  0.35  ? Macula  No Subretinal  hemorrhage, no macular thickening, Soft drusen, Geographic atrophy  ? Vessels  Normal  ? Periphery  Normal  ? ?  ?  ? ?  ? ? ?IMAGING AND PROCEDURES  ?Imaging and Procedures for 03/25/22 ? ?OCT, Retina - OU -

## 2022-03-29 ENCOUNTER — Telehealth: Payer: Self-pay | Admitting: *Deleted

## 2022-03-29 MED ORDER — POTASSIUM CHLORIDE ER 10 MEQ PO TBCR: 20.0000 meq | EXTENDED_RELEASE_TABLET | Freq: Every day | ORAL | 3 refills | Status: DC

## 2022-03-29 NOTE — Telephone Encounter (Signed)
Spoke with pt daughter,  Aware of dr Ludwig Clarks recommendations. New script sent to the pharmacy  ?Left message for julie, palliative care nurse, to call  ?

## 2022-03-29 NOTE — Telephone Encounter (Signed)
-----   Message from Lewayne Bunting, MD sent at 03/29/2022  6:52 AM EDT ----- ?Kdur 20 meq daily; bmet one week ?[name] ? ?----- Message ----- ?From: Freddi Starr, RN ?Sent: 03/26/2022  10:40 AM EDT ?To: Lewayne Bunting, MD ? ? ?

## 2022-03-30 ENCOUNTER — Telehealth: Payer: Self-pay

## 2022-03-30 NOTE — Telephone Encounter (Signed)
941 am.  Spoke with Hilda Blades, RN regarding patient's potassium level.  Potassium started yesterday and patient will need a repeat BMP in one week.  Will schedule appointment with patient for next Monday.  ?

## 2022-03-30 NOTE — Telephone Encounter (Signed)
943 am.  Return call made to [place], RN regarding blood work completed last week.  Patient started on potassium yesterday and will need a repeat bmp on next Monday. ? ? ?Spoke with daughter-[name] to advise I had received a message from [place] regarding blood work needed.  [name] advised that patient was started on potassium yesterday. ?

## 2022-03-30 NOTE — Telephone Encounter (Signed)
Spoke with julie, aware of new potassium order and she will get bmp in one week. ?

## 2022-03-30 NOTE — Telephone Encounter (Signed)
12 pm.  Phone call made to patient to schedule a home visit to obtain BMP.  Message left on VM requesting a call back.  ?

## 2022-03-30 NOTE — Telephone Encounter (Signed)
This encounter was created in error - please disregard.

## 2022-04-05 ENCOUNTER — Other Ambulatory Visit: Payer: Medicare Other

## 2022-04-05 DIAGNOSIS — Z515 Encounter for palliative care: Secondary | ICD-10-CM

## 2022-04-05 NOTE — Progress Notes (Signed)
HPI: FU coronary artery disease (s/p CABG), prior aortic valve replacement and aortic insufficiency. Last cardiac catheterization in 2011 revealed normal left main; 30% first diagonal, 50% LAD and 60% second diagonal. There was a 40-50% circumflex and a 40% right coronary artery. All grafts were occluded. Medical therapy recommended. Transesophageal echocardiogram in February of 2012 showed an ejection fraction of 45%. There was a [employer]. Jude aortic valve with moderate perivalvular aortic insufficiency. There was moderate mitral regurgitation, moderate left atrial enlargement, mild right atrial enlargement and mild tricuspid regurgitation. Carotid dopplers August 2020 showed 40 to 59% right and 1 to 39% left stenosis. Echocardiogram October 2021 interpreted as normal LV function, moderate left ventricular hypertrophy, grade 1 diastolic dysfunction, biatrial enlargement, mild to moderate mitral regurgitation, previous aortic valve replacement with moderate aortic insufficiency. I personally reviewed the images and ejection fraction appears to be 40-45%. CTA October 2021 showed 2.9 x 3.1 cm abdominal aortic aneurysm. Since she was last seen, she denies increased dyspnea, chest pain or syncope.  She has days when she feels weak.  She also has noted occasional hematochezia that she attributes to hemorrhoids.  Current Outpatient Medications  Medication Sig Dispense Refill   acetaminophen (TYLENOL) 650 MG CR tablet Take 650 mg by mouth every 8 (eight) hours as needed for pain.     Aflibercept (EYLEA) 2 MG/0.05ML SOLN by Intravitreal route. Every 9 weeks     ALPRAZolam (XANAX) 0.25 MG tablet Take 0.125-0.25 mg by mouth at bedtime as needed for anxiety.      atorvastatin (LIPITOR) 40 MG tablet TAKE ONE TABLET BY MOUTH ONCE DAILY. 90 tablet 1   baclofen (LIORESAL) 10 MG tablet Take 10 mg by mouth as needed for muscle spasms.     Cholecalciferol (VITAMIN D3 PO) Take by mouth once a week. On sundays      docusate sodium (COLACE) 100 MG capsule Take 100 mg by mouth as needed.     donepezil (ARICEPT) 10 MG tablet TAKE ONE TABLET BY MOUTH ONCE DAILY. (Patient taking differently: Take 10 mg by mouth at bedtime.) 30 tablet 0   ferrous sulfate 325 (65 FE) MG EC tablet Take 325 mg by mouth daily.      furosemide (LASIX) 40 MG tablet Take 1.5 tablets (60 mg total) by mouth daily. 135 tablet 3   HYDROcodone-acetaminophen (NORCO) 7.5-325 MG tablet Take 1 tablet by mouth every 6 (six) hours as needed for moderate pain.     hydrocortisone (ANUSOL-HC) 2.5 % rectal cream Place 1 application rectally 3 (three) times daily as needed for hemorrhoids or anal itching.     levalbuterol (XOPENEX) 0.63 MG/3ML nebulizer solution USE 1 VIAL VIA NEBULIZER EVERY 8 HOURS AS NEEDED. 150 mL 3   meclizine (ANTIVERT) 25 MG tablet Take 25 mg by mouth 3 (three) times daily as needed for dizziness.     Melatonin 3 MG TABS Take 1 tablet by mouth at bedtime.     methylPREDNISolone (MEDROL) 4 MG tablet Take 4 mg by mouth 2 (two) times daily as needed.     multivitamin-lutein (OCUVITE-LUTEIN) CAPS capsule Take 1 capsule by mouth daily.     ondansetron (ZOFRAN) 4 MG tablet Take 4 mg by mouth every 8 (eight) hours as needed for nausea or vomiting.     polyethylene glycol (MIRALAX / GLYCOLAX) 17 g packet Take 17 g by mouth daily as needed for moderate constipation.     potassium chloride (KLOR-CON) 10 MEQ tablet Take 2 tablets (20 mEq  total) by mouth daily. 180 tablet 3   pregabalin (LYRICA) 75 MG capsule TAKE 1 CAPSULE BY MOUTH EVERY OTHER DAY. 15 capsule 0   spironolactone (ALDACTONE) 25 MG tablet Take 1 tablet (25 mg total) by mouth once for 1 dose. Keep upcoming appointment for future refills. (Patient taking differently: Take 25 mg by mouth once. Keep upcoming appointment for future refills. Takes half a tablet in AM and PM) 30 tablet 0   umeclidinium-vilanterol (ANORO ELLIPTA) 62.5-25 MCG/ACT AEPB USE 1 INHALATION ONCE DAILY. 60 each  3   warfarin (COUMADIN) 5 MG tablet TAKE 1-2 TABLETS DAILY OR AS PRESCRIBED BY COUMADIN CLINIC 90 tablet 0   zinc sulfate 50 MG CAPS capsule Take 50 mg by mouth 2 (two) times daily.      traZODone (DESYREL) 50 MG tablet Take 0.5-1 tablets (25-50 mg total) by mouth at bedtime as needed for sleep. (Patient not taking: Reported on 04/13/2022) 30 tablet 3   No current facility-administered medications for this visit.     Past Medical History:  Diagnosis Date   Anemia    Anxiety    Arthritis    Blood transfusion without reported diagnosis    Carotid artery stenosis    Without infarction   Cataract    Chronic combined systolic (congestive) and diastolic (congestive) heart failure (HCC)    COPD (chronic obstructive pulmonary disease) ([place])    Wears 02 at home 2 L   Coronary artery disease    Heart valve replaced by other means    Hypercholesterolemia    Pure   Hypertension    Unspecified   LBBB (left bundle branch block)    Macular degeneration (senile) of retina, unspecified    On home O2    Postsurgical aortocoronary bypass status    Stroke ([place])    Transient global amnesia    Unspecified hereditary and idiopathic peripheral neuropathy    Unspecified vitamin D deficiency     Past Surgical History:  Procedure Laterality Date   AORTIC VALVE REPLACEMENT     APPENDECTOMY     CORONARY ARTERY BYPASS GRAFT     ERCP N/A 09/13/2020   Procedure: ENDOSCOPIC RETROGRADE CHOLANGIOPANCREATOGRAPHY (ERCP);  Surgeon: Milus Banister, MD;  Location: [facility] ENDOSCOPY;  Service: Endoscopy;  Laterality: N/A;   HIP PINNING,CANNULATED Right 07/18/2018   Procedure: RIGHT CANNULATED HIP PINNING;  Surgeon: Altamese Waverly, MD;  Location: [facility];  Service: Orthopedics;  Laterality: Right;   REMOVAL OF STONES  09/13/2020   Procedure: REMOVAL OF STONES;  Surgeon: Milus Banister, MD;  Location: [facility] ENDOSCOPY;  Service: Endoscopy;;   SPHINCTEROTOMY  09/13/2020   Procedure: Joan Mayans;  Surgeon: Milus Banister, MD;  Location: [facility] ENDOSCOPY;  Service: Endoscopy;;   TONSILLECTOMY      Social History   Socioeconomic History   Marital status: Widowed    Spouse name: Not on file   Number of children: 2   Years of education: 12th   Highest education level: Not on file  Occupational History    Employer: RETIRED  Tobacco Use   Smoking status: Former    Packs/day: 0.50    Types: Cigarettes    Quit date: 11/30/1991    Years since quitting: 30.3   Smokeless tobacco: Never   Tobacco comments:    Tobacco use-no  Substance and Sexual Activity   Alcohol use: No   Drug use: No   Sexual activity: Never  Other Topics Concern   Not on file  Social History Narrative  Pt lives at home alone.   Caffeine Use: very rarely   Social Determinants of Radio broadcast assistant Strain: Not on file  Food Insecurity: Not on file  Transportation Needs: Not on file  Physical Activity: Not on file  Stress: Not on file  Social Connections: Not on file  Intimate Partner Violence: Not on file    Family History  Problem Relation Age of Onset   Heart attack Father    Heart disease Father    Stroke Father    Stroke Sister    Hypertension Mother    Stroke Mother    Neuropathy Brother    COPD Sister     ROS: no fevers or chills, productive cough, hemoptysis, dysphasia, odynophagia, melena, hematochezia, dysuria, hematuria, rash, seizure activity, orthopnea, PND, pedal edema, claudication. Remaining systems are negative.  Physical Exam: Well-developed well-nourished in no acute distress.  Skin is warm and dry.  HEENT is normal.  Neck is supple.  Chest is clear to auscultation with normal expansion.  Cardiovascular exam is regular rate and rhythm.  Crisp mechanical valve sound Abdominal exam nontender or distended. No masses palpated. Extremities show no edema. neuro grossly intact  ECG-normal sinus rhythm at a rate of 70, left bundle branch block.  Personally reviewed  A/P  1 chronic  systolic congestive heart failure-patient is euvolemic on examination today.  We will continue diuretics.  At present dose  2 cardiomyopathy-patient has had difficulties with orthostasis previously.  I have therefore not treated with ARB, Entresto or beta-blockade.  3 status post aortic valve replacement-continue SBE prophylaxis.  Continue Coumadin.  Note previous echocardiogram showed moderate aortic insufficiency but this has been treated conservatively given her age.  I do not think she would be a good candidate for procedures and she prefers conservative therapy.  We have therefore not repeated echocardiogram.  4 hypertension-blood pressure controlled.  Continue present medical regimen.  5 hyperlipidemia-continue statin.  6 coronary artery disease-patient denies chest pain.  Plan to continue statin.  She is not on aspirin given need for Coumadin.  7 abdominal aortic aneurysm-treating conservatively given her age and overall medical condition.  I do not think she would be a good candidate for surgery and we have therefore not pursued follow-up imaging.  8 carotid artery disease-as outlined above I do not think she would be a good surgical candidate and we have therefore treated medically.  We have also not pursued further imaging.  9 history of hematochezia-we will check hemoglobin and ferritin.  She would like to avoid procedures.  If her hematochezia worsens may need to see GI as she needs to remain on Coumadin for her aortic valve replacement.  Kirk Ruths, MD

## 2022-04-05 NOTE — Progress Notes (Signed)
PATIENT NAME: [name] DOB: 1926/03/02 MRN: [id number]  PRIMARY CARE PROVIDER: Benita Stabile, MD  RESPONSIBLE PARTY:  Acct ID - Guarantor Home Phone Work Phone Relationship Acct Type  [id number] [name], [name]* [phone]  Self P/F     [address], [place], [place] [address]      Patient seen to obtain BMP ordered by Dr. [name] due to hypokalemia.  Patient advised of reason for blood work and agreeable to proceed.  Accessed left [facility] without difficulty.  Patient tolerated well.  Blood work taken to [facility] on [address] in [place] for processing.  Results to be faxed to Dr. Jens Som.     Truitt Merle, RN

## 2022-04-06 ENCOUNTER — Ambulatory Visit (INDEPENDENT_AMBULATORY_CARE_PROVIDER_SITE_OTHER): Payer: Medicare Other | Admitting: Internal Medicine

## 2022-04-06 DIAGNOSIS — Z952 Presence of prosthetic heart valve: Secondary | ICD-10-CM

## 2022-04-06 DIAGNOSIS — I6350 Cerebral infarction due to unspecified occlusion or stenosis of unspecified cerebral artery: Secondary | ICD-10-CM

## 2022-04-06 DIAGNOSIS — I359 Nonrheumatic aortic valve disorder, unspecified: Secondary | ICD-10-CM

## 2022-04-06 DIAGNOSIS — Z5181 Encounter for therapeutic drug level monitoring: Secondary | ICD-10-CM

## 2022-04-06 LAB — POCT INR: INR: 2.6 (ref 2.0–3.0)

## 2022-04-07 ENCOUNTER — Encounter: Payer: Self-pay | Admitting: *Deleted

## 2022-04-09 ENCOUNTER — Other Ambulatory Visit: Payer: Medicare Other | Admitting: Nurse Practitioner

## 2022-04-13 ENCOUNTER — Encounter: Payer: Self-pay | Admitting: Cardiology

## 2022-04-13 ENCOUNTER — Ambulatory Visit (INDEPENDENT_AMBULATORY_CARE_PROVIDER_SITE_OTHER): Payer: Medicare Other | Admitting: Cardiology

## 2022-04-13 VITALS — BP 116/62 | HR 70 | Ht 62.0 in | Wt 122.6 lb

## 2022-04-13 DIAGNOSIS — I6350 Cerebral infarction due to unspecified occlusion or stenosis of unspecified cerebral artery: Secondary | ICD-10-CM

## 2022-04-13 DIAGNOSIS — I2510 Atherosclerotic heart disease of native coronary artery without angina pectoris: Secondary | ICD-10-CM | POA: Diagnosis not present

## 2022-04-13 DIAGNOSIS — Z952 Presence of prosthetic heart valve: Secondary | ICD-10-CM | POA: Diagnosis not present

## 2022-04-13 DIAGNOSIS — E7800 Pure hypercholesterolemia, unspecified: Secondary | ICD-10-CM | POA: Diagnosis not present

## 2022-04-13 DIAGNOSIS — I10 Essential (primary) hypertension: Secondary | ICD-10-CM | POA: Diagnosis not present

## 2022-04-13 LAB — FERRITIN: Ferritin: 316 ng/mL — ABNORMAL HIGH (ref 15–150)

## 2022-04-13 LAB — CBC
Hematocrit: 28.4 % — ABNORMAL LOW (ref 34.0–46.6)
Hemoglobin: 9.5 g/dL — ABNORMAL LOW (ref 11.1–15.9)
MCH: 30.4 pg (ref 26.6–33.0)
MCHC: 33.5 g/dL (ref 31.5–35.7)
MCV: 91 fL (ref 79–97)
Platelets: 178 10*3/uL (ref 150–450)
RBC: 3.12 x10E6/uL — ABNORMAL LOW (ref 3.77–5.28)
RDW: 12.3 % (ref 11.7–15.4)
WBC: 7 10*3/uL (ref 3.4–10.8)

## 2022-04-13 NOTE — Patient Instructions (Signed)

## 2022-04-20 ENCOUNTER — Ambulatory Visit (INDEPENDENT_AMBULATORY_CARE_PROVIDER_SITE_OTHER): Payer: Medicare Other | Admitting: *Deleted

## 2022-04-20 ENCOUNTER — Other Ambulatory Visit: Payer: Self-pay | Admitting: Cardiology

## 2022-04-20 DIAGNOSIS — Z952 Presence of prosthetic heart valve: Secondary | ICD-10-CM

## 2022-04-20 DIAGNOSIS — Z5181 Encounter for therapeutic drug level monitoring: Secondary | ICD-10-CM | POA: Diagnosis not present

## 2022-04-20 DIAGNOSIS — I6350 Cerebral infarction due to unspecified occlusion or stenosis of unspecified cerebral artery: Secondary | ICD-10-CM

## 2022-04-20 DIAGNOSIS — I359 Nonrheumatic aortic valve disorder, unspecified: Secondary | ICD-10-CM | POA: Diagnosis not present

## 2022-04-20 LAB — POCT INR: INR: 2.6 (ref 2.0–3.0)

## 2022-04-21 ENCOUNTER — Encounter: Payer: Self-pay | Admitting: *Deleted

## 2022-04-29 ENCOUNTER — Encounter: Payer: Self-pay | Admitting: Nurse Practitioner

## 2022-04-29 ENCOUNTER — Other Ambulatory Visit: Payer: Medicare Other | Admitting: Nurse Practitioner

## 2022-04-29 ENCOUNTER — Other Ambulatory Visit: Payer: Medicare Other

## 2022-04-29 DIAGNOSIS — R5381 Other malaise: Secondary | ICD-10-CM

## 2022-04-29 DIAGNOSIS — Z515 Encounter for palliative care: Secondary | ICD-10-CM

## 2022-04-29 DIAGNOSIS — I5040 Unspecified combined systolic (congestive) and diastolic (congestive) heart failure: Secondary | ICD-10-CM

## 2022-04-29 DIAGNOSIS — R0602 Shortness of breath: Secondary | ICD-10-CM

## 2022-04-29 NOTE — Progress Notes (Signed)
Therapist, nutritional Palliative Care Consult Note Telephone: [phone]  Fax: [phone]    Date of encounter: 04/29/22 1:34 PM PATIENT NAME: NAMITA YEARWOOD [address] [place] [address]   [id number] (home)  DOB: Sep 08, 1926 MRN: [id number] PRIMARY CARE PROVIDER:    Benita Stabile, MD,  [address] Laurey Morale [place] [address] [phone]  RESPONSIBLE PARTY:    Contact Information     Name Relation Home Work Mobile   [place] Brett Canales Daughter [phone]   [place] Ee Granddaughter [phone]  [phone]   Lindi Adie, Debi Daughter   [phone]   Huel Cote   [phone]     I connected with [name] SW with  [name], [name], on 04/29/22 by a video enabled telemedicine application and verified that I am speaking with the correct person using two identifiers.   I discussed the limitations of evaluation and management by telemedicine. The patient expressed understanding and agreed to proceed.  Palliative Care was asked to follow this patient by consultation request of  Benita Stabile, MD to address advance care planning and complex medical decision making. This is a follow up visit.                                  ASSESSMENT AND PLAN / RECOMMENDATIONS:  Symptom Management/Plan: ACP: DNR; declined workup for trace of blood in stool; avoid hospitalizations   2.  Shortness of breath secondary to COPD, CHF, stable, continue monitoring weights, supplemental O2, We talked about increase use of nebulizer treatments this week. Continue Lasix 60mg  daily, f/u visits with Cardiology.  Continue current plan   3. Debility secondary to COPD, CHF, continue strengthening, mobility, encourage self training.    11/04/2021 weight 113 lbs 12/08/2021 weight 114 lbs 12/29/2021 weight 120 lbs 02/24/2022 weight 119 lbs 04/13/2022 weight 122 lbs  4. Palliative care encounter; Palliative care encounter;  Palliative medicine team will continue to support patient, patient's family, and medical team. Visit consisted of counseling and education dealing with the complex and emotionally intense issues of symptom management and palliative care in the setting of serious and potentially life-threatening illness  Follow up Palliative Care Visit: Palliative care will continue to follow for complex medical decision making, advance care planning, and clarification of goals. Return 4 weeks or prn. I spent 41 minutes providing this consultation. More than 50% of the time in this consultation was spent in counseling and care coordination. PPS: 40% Chief Complaint: Follow up palliative consult for complex medical decision making, address goals, manage ongoing symptoms  HISTORY OF PRESENT ILLNESS:  ALMADELIA LOOMAN is a 88 y.o. 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 I connected with [address] [facility] for visual telemedicine visit with [name], caregiver with [name]. We talked about how [name] has been feeling. Ms. Waugh endorses the fluid, shortness of breath has improved with current regimen of 60 mg lasix daily. She continues to have lower abdominal pains when she urinates, intermit back pain, denies fever, UTI symptoms. Offered send UA, Ms  Baysinger would prefer to wait. Discussed if symptoms worsen to contact Dr Carita Pian or [facility]. We  talked about appetite, weight, ros, symptoms, functional abilities, no recent falls. Ms. Awtrey continues to wear continuous O2. [name] and [name] had questions about recent labs, including elevated Ferritan level. Reviewed labs with [name], explained Ferritan and  trended down from previous. We talked about next appointment with Dr Margo Aye is in August, will have labs prior to visit, may consider having rechecked at that time. We talked about bright red blood in her stool, hemorrhoids with using prep H which helps, H/H appears stable on labs. Medical goals reviewed. Discussed f/u pc visit, scheduled. Therapeutic listening, emotional support provided, Questions answered.   History obtained from review of EMR, discussion with [name] caregiver with [name], and [employer] SW.  I reviewed available labs, medications, imaging, studies and related documents from the EMR.  Records reviewed and summarized above.   ROS 10 point system reviewed all negative except HPI  Physical Exam: Deferred Thank you for the opportunity to participate in the care of [name].  The palliative care team will continue to follow. Please call our office at [phone] if we can be of additional assistance.   Abhijot Straughter [place], NP

## 2022-04-29 NOTE — Progress Notes (Signed)
COMMUNITY PALLIATIVE CARE SW NOTE  PATIENT NAME: [name] DOB: February 17, 1926 MRN: [id number]  PRIMARY CARE PROVIDER: Celene Squibb, MD  RESPONSIBLE PARTY:  Acct ID - Guarantor Home Phone Work Phone Relationship Acct Type  [id number] [name], [name]  Self P/F     [place], [place], [place] [address]     PLAN OF CARE and INTERVENTIONS:       GOALS OF CARE/ ADVANCE CARE PLANNING:  Goals include to maximize quality of life and symptom management. Our advance care planning conversation included a discussion about:    - The value and importance of advance care planning  - Review and updating or creation of an advance directive document.                      Patient is a DNR. Patients daughter, [name], is POA   2.     Palliative care encounter: Palliative medicine team will continue to support patient, patient's family, and medical team. Visit consisted of counseling and education dealing with the complex and emotionally intense issues of symptom management and palliative care in the setting of serious and potentially life-threatening illness.   SW met with patient and patient's live-in caregiver, [name], in patient's home for routine monthly visit. Patient lives in a one story home. Patient has chronic COPD and respiratory failure.   Functional changes/updates: Patient has not had any functional changes or declines since recent PC visit. Patient reports having abdomen discomfort today. No falls reported. Patient has SPC and RW to assist with ambulation if needed.  Patient continues to be independent with all ADL's. Patient recently saw cardiologist and had labs drawn on 5/30, results were normal. Scheduled to see PCP in Aug.   Patient continues to  try to go out daily with caregiver for lunch and visit the gym, mostly for socialization.     Protein calorie malnutrition/weight loss/Appetite: Patient's appetite is good. Wt this morning 118lbs.    Patient denies concerns with  sleeping. Patient has new mattress for hospital bed, patient shares this is now helping more with her sleeping.   Psychosocial assessment: completed.   In home support: Patient has live in private caregiver, [name].   Food: no needs.  Transportation: no needs.  Safety: no concerns.  No additional physical needs identified at this time. Ongoing support/resources will continued to be offered if needed.       SW discussed goals, reviewed care plan, provided emotional support, used active and reflective listening in the form of reciprocity emotional response. Questions and concerns were addressed. The patient/family was encouraged to call with any additional questions and/or concerns. PC Provided general support and encouragement, no other unmet needs identified. Will continue to follow.   Next PC with PC NP to visit in person 7/18 @930am .   3.         PATIENT/CAREGIVER EDUCATION/ COPING:   Appearance: well groomed, appropriate given situation  Mental Status: Alert/oriented. Eye Contact: Good. Able to engage in proper eye contact  Thought Process: rational  Thought Content: not assessed  Speech: Normal rate, volume, tone  Mood: Normal and calm Affect: Congruent to endorsed mood, full ranging Insight: normal Judgement: normal  Interaction Style: Cooperative   Patient A&O, patient engaged in fluent conversation and answered all questions appropriately. Patients mood was jovial during visit today. PHQ 9 is 0. Patient continues with active lifestyle of going to the gym and riding the stationary bike, going out to lunch  daily and going on leisure outings. Patient A&O x3, Pt is HOH but able to answer questions appropriately. Patient denies any anxiety or depression, patient takes Xanax HS. Patients family is supportive.   4.         PERSONAL EMERGENCY PLAN:  Patient will call 9-1-1 for emergencies.     5.         COMMUNITY RESOURCES COORDINATION/ HEALTH CARE NAVIGATION:  Patient and  children manages her care.   6.         FINANCIAL CONCERNS/NEEDS: None.                        Primary Health Insurance: Medicare Secondary Health Insurance: N/A Prescription Coverage: Yes, no history of difficulty obtaining or affording prescriptions reported     SOCIAL HX:  Social History   Tobacco Use   Smoking status: Former    Packs/day: 0.50    Types: Cigarettes    Quit date: 11/30/1991    Years since quitting: 30.4   Smokeless tobacco: Never   Tobacco comments:    Tobacco use-no  Substance Use Topics   Alcohol use: No    CODE STATUS: DNR  ADVANCED DIRECTIVES: Y MOST FORM COMPLETE:  N HOSPICE EDUCATION PROVIDED: N  PPS: patient is INDP with ADL'S.  Time spent: 40 min    [place] Henrene Pastor, [place]

## 2022-05-04 ENCOUNTER — Ambulatory Visit (INDEPENDENT_AMBULATORY_CARE_PROVIDER_SITE_OTHER): Payer: Medicare Other | Admitting: Cardiovascular Disease

## 2022-05-04 DIAGNOSIS — I359 Nonrheumatic aortic valve disorder, unspecified: Secondary | ICD-10-CM | POA: Diagnosis not present

## 2022-05-04 DIAGNOSIS — Z5181 Encounter for therapeutic drug level monitoring: Secondary | ICD-10-CM | POA: Diagnosis not present

## 2022-05-04 DIAGNOSIS — I6350 Cerebral infarction due to unspecified occlusion or stenosis of unspecified cerebral artery: Secondary | ICD-10-CM

## 2022-05-04 DIAGNOSIS — Z952 Presence of prosthetic heart valve: Secondary | ICD-10-CM

## 2022-05-04 LAB — POCT INR: INR: 2.8 (ref 2.0–3.0)

## 2022-05-10 ENCOUNTER — Telehealth: Payer: Self-pay

## 2022-05-17 ENCOUNTER — Ambulatory Visit (INDEPENDENT_AMBULATORY_CARE_PROVIDER_SITE_OTHER): Payer: Medicare Other | Admitting: Pharmacist

## 2022-05-17 DIAGNOSIS — I6350 Cerebral infarction due to unspecified occlusion or stenosis of unspecified cerebral artery: Secondary | ICD-10-CM

## 2022-05-17 DIAGNOSIS — I359 Nonrheumatic aortic valve disorder, unspecified: Secondary | ICD-10-CM | POA: Diagnosis not present

## 2022-05-17 DIAGNOSIS — Z5181 Encounter for therapeutic drug level monitoring: Secondary | ICD-10-CM | POA: Diagnosis not present

## 2022-05-17 DIAGNOSIS — Z952 Presence of prosthetic heart valve: Secondary | ICD-10-CM

## 2022-05-17 LAB — POCT INR: INR: 3.4 — AB (ref 2.0–3.0)

## 2022-05-17 NOTE — Patient Instructions (Signed)
Description   Spoke with daughter.  Hold dose today and then continue warfarin 1/2 a tablet daily EXCEPT for 1 tablet on Monday, Wednesday and Friday. Recheck INR in 2 weeks.   *PLEASE talk to daughter [name] for dosing instructions* *Home meter, checks every 2 weeks*

## 2022-05-20 ENCOUNTER — Other Ambulatory Visit: Payer: Self-pay | Admitting: Cardiology

## 2022-05-27 ENCOUNTER — Encounter (INDEPENDENT_AMBULATORY_CARE_PROVIDER_SITE_OTHER): Payer: Self-pay | Admitting: Ophthalmology

## 2022-05-27 ENCOUNTER — Encounter: Payer: Self-pay | Admitting: Cardiology

## 2022-05-27 ENCOUNTER — Ambulatory Visit (INDEPENDENT_AMBULATORY_CARE_PROVIDER_SITE_OTHER): Payer: Medicare Other | Admitting: Ophthalmology

## 2022-05-27 ENCOUNTER — Telehealth: Payer: Self-pay | Admitting: Cardiology

## 2022-05-27 DIAGNOSIS — H353212 Exudative age-related macular degeneration, right eye, with inactive choroidal neovascularization: Secondary | ICD-10-CM | POA: Diagnosis not present

## 2022-05-27 DIAGNOSIS — H353221 Exudative age-related macular degeneration, left eye, with active choroidal neovascularization: Secondary | ICD-10-CM | POA: Diagnosis not present

## 2022-05-27 DIAGNOSIS — H353113 Nonexudative age-related macular degeneration, right eye, advanced atrophic without subfoveal involvement: Secondary | ICD-10-CM

## 2022-05-27 DIAGNOSIS — I6350 Cerebral infarction due to unspecified occlusion or stenosis of unspecified cerebral artery: Secondary | ICD-10-CM

## 2022-05-27 MED ORDER — SPIRONOLACTONE 25 MG PO TABS: 12.5000 mg | ORAL_TABLET | Freq: Every day | ORAL | 3 refills | Status: DC

## 2022-05-27 MED ORDER — AFLIBERCEPT 2MG/0.05ML IZ SOLN FOR KALEIDOSCOPE
2.0000 mg | INTRAVITREAL | Status: AC | PRN
  Administered 2022-05-27: 2 mg via INTRAVITREAL

## 2022-05-27 NOTE — Telephone Encounter (Addendum)
Returned call to patients daughter (okay per DPR) who states that patient has been taking Spironolactone (1/2) tablet 12.5mg  total for a long time now. According to last OV note on 5/30 it was entered that patient was taking spironolactone 25mg  once daily. According to patients daughter, patient was taking Spironolactone 12.5mg  (1/2 Tablet) at the time of that appointment. She states she just wants to make sure patient is to continue the 12.5mg  and not be taking a whole tablet. Advised her I would forward to Dr. for him to review and advise. Patients daughter verbalized understanding.

## 2022-05-27 NOTE — Progress Notes (Signed)
05/27/2022     CHIEF COMPLAINT Patient presents for  Chief Complaint  Patient presents with   Macular Degeneration      HISTORY OF PRESENT ILLNESS: [name] is a 88 y.o. female who presents to the clinic today for:   HPI   9 weeks for DILATE OU, EYLEA OCT, OS. Pt stated vision has been stable since last visit.  Last edited by Edmon Crape, MD on 05/27/2022  3:17 PM.      Referring physician: Benita Stabile, MD [address] [place],  [place] [address]  HISTORICAL INFORMATION:   Selected notes from the MEDICAL RECORD NUMBER    Lab Results  Component Value Date   HGBA1C 5.7 (H) 10/27/2019     CURRENT MEDICATIONS: Current Outpatient Medications (Ophthalmic Drugs)  Medication Sig   Aflibercept (EYLEA) 2 MG/0.05ML SOLN by Intravitreal route. Every 9 weeks   No current facility-administered medications for this visit. (Ophthalmic Drugs)   Current Outpatient Medications (Other)  Medication Sig   acetaminophen (TYLENOL) 650 MG CR tablet Take 650 mg by mouth every 8 (eight) hours as needed for pain.   ALPRAZolam (XANAX) 0.25 MG tablet Take 0.125-0.25 mg by mouth at bedtime as needed for anxiety.    atorvastatin (LIPITOR) 40 MG tablet TAKE ONE TABLET BY MOUTH ONCE DAILY.   baclofen (LIORESAL) 10 MG tablet Take 10 mg by mouth as needed for muscle spasms.   Cholecalciferol (VITAMIN D3 PO) Take by mouth once a week. On sundays   docusate sodium (COLACE) 100 MG capsule Take 100 mg by mouth as needed.   donepezil (ARICEPT) 10 MG tablet TAKE ONE TABLET BY MOUTH ONCE DAILY. (Patient taking differently: Take 10 mg by mouth at bedtime.)   ferrous sulfate 325 (65 FE) MG EC tablet Take 325 mg by mouth daily.    furosemide (LASIX) 40 MG tablet Take 1.5 tablets (60 mg total) by mouth daily.   HYDROcodone-acetaminophen (NORCO) 7.5-325 MG tablet Take 1 tablet by mouth every 6 (six) hours as needed for moderate pain.   hydrocortisone (ANUSOL-HC) 2.5 % rectal cream Place 1  application rectally 3 (three) times daily as needed for hemorrhoids or anal itching.   levalbuterol (XOPENEX) 0.63 MG/3ML nebulizer solution USE 1 VIAL VIA NEBULIZER EVERY 8 HOURS AS NEEDED.   meclizine (ANTIVERT) 25 MG tablet Take 25 mg by mouth 3 (three) times daily as needed for dizziness.   Melatonin 3 MG TABS Take 1 tablet by mouth at bedtime.   methylPREDNISolone (MEDROL) 4 MG tablet Take 4 mg by mouth 2 (two) times daily as needed.   multivitamin-lutein (OCUVITE-LUTEIN) CAPS capsule Take 1 capsule by mouth daily.   ondansetron (ZOFRAN) 4 MG tablet Take 4 mg by mouth every 8 (eight) hours as needed for nausea or vomiting.   polyethylene glycol (MIRALAX / GLYCOLAX) 17 g packet Take 17 g by mouth daily as needed for moderate constipation.   potassium chloride (KLOR-CON) 10 MEQ tablet Take 2 tablets (20 mEq total) by mouth daily.   pregabalin (LYRICA) 75 MG capsule TAKE 1 CAPSULE BY MOUTH EVERY OTHER DAY.   spironolactone (ALDACTONE) 25 MG tablet TAKE (1) TABLET BY MOUTH DAILY.   traZODone (DESYREL) 50 MG tablet Take 0.5-1 tablets (25-50 mg total) by mouth at bedtime as needed for sleep. (Patient not taking: Reported on 04/13/2022)   umeclidinium-vilanterol (ANORO ELLIPTA) 62.5-25 MCG/ACT AEPB USE 1 INHALATION ONCE DAILY.   warfarin (COUMADIN) 5 MG tablet TAKE 1-2 TABLETS DAILY OR AS  PRESCRIBED BY COUMADIN CLINIC   zinc sulfate 50 MG CAPS capsule Take 50 mg by mouth 2 (two) times daily.    No current facility-administered medications for this visit. (Other)      REVIEW OF SYSTEMS: ROS   Negative for: Constitutional, Gastrointestinal, Neurological, Skin, Genitourinary, Musculoskeletal, HENT, Endocrine, Cardiovascular, Eyes, Respiratory, Psychiatric, Allergic/Imm, Heme/Lymph Last edited by Angeline Slim on 05/27/2022  2:33 PM.       ALLERGIES Allergies  Allergen Reactions   Codeine Nausea And Vomiting   Penicillins Other (See Comments)    Unknown reaction - listed on MAR Has patient had  a PCN reaction causing immediate rash, facial/tongue/throat swelling, SOB or lightheadedness with hypotension: Unknown Has patient had a PCN reaction causing severe rash involving mucus membranes or skin necrosis: Unknown Has patient had a PCN reaction that required hospitalization: Unknown Has patient had a PCN reaction occurring within the last 10 years: Unknown If all of the above answers are "NO", then may proceed with Cephalosporin use.    PAST MEDICAL HISTORY Past Medical History:  Diagnosis Date   Anemia    Anxiety    Arthritis    Blood transfusion without reported diagnosis    Carotid artery stenosis    Without infarction   Cataract    Chronic combined systolic (congestive) and diastolic (congestive) heart failure (HCC)    COPD (chronic obstructive pulmonary disease) (HCC)    Wears 02 at home 2 L   Coronary artery disease    Heart valve replaced by other means    Hypercholesterolemia    Pure   Hypertension    Unspecified   LBBB (left bundle branch block)    Macular degeneration (senile) of retina, unspecified    On home O2    Postsurgical aortocoronary bypass status    Stroke (HCC)    Transient global amnesia    Unspecified hereditary and idiopathic peripheral neuropathy    Unspecified vitamin D deficiency    Past Surgical History:  Procedure Laterality Date   AORTIC VALVE REPLACEMENT     APPENDECTOMY     CORONARY ARTERY BYPASS GRAFT     ERCP N/A 09/13/2020   Procedure: ENDOSCOPIC RETROGRADE CHOLANGIOPANCREATOGRAPHY (ERCP);  Surgeon: Rachael Fee, MD;  Location: [facility] ENDOSCOPY;  Service: Endoscopy;  Laterality: N/A;   HIP PINNING,CANNULATED Right 07/18/2018   Procedure: RIGHT CANNULATED HIP PINNING;  Surgeon: Myrene Galas, MD;  Location: [facility] OR;  Service: Orthopedics;  Laterality: Right;   REMOVAL OF STONES  09/13/2020   Procedure: REMOVAL OF STONES;  Surgeon: Rachael Fee, MD;  Location: [facility] ENDOSCOPY;  Service: Endoscopy;;   SPHINCTEROTOMY  09/13/2020    Procedure: Dennison Mascot;  Surgeon: Rachael Fee, MD;  Location: [facility] ENDOSCOPY;  Service: Endoscopy;;   TONSILLECTOMY      FAMILY HISTORY Family History  Problem Relation Age of Onset   Heart attack Father    Heart disease Father    Stroke Father    Stroke Sister    Hypertension Mother    Stroke Mother    Neuropathy Brother    COPD Sister     SOCIAL HISTORY Social History   Tobacco Use   Smoking status: Former    Packs/day: 0.50    Types: Cigarettes    Quit date: 11/30/1991    Years since quitting: 30.5   Smokeless tobacco: Never   Tobacco comments:    Tobacco use-no  Substance Use Topics   Alcohol use: No   Drug use: No  OPHTHALMIC EXAM:  Base Eye Exam     Visual Acuity (ETDRS)       Right Left   Dist cc 20/80 -2 20/150 -1   Dist ph cc 20/60 NI    Correction: Glasses         Tonometry (Tonopen, 2:40 PM)       Right Left   Pressure 11 14         Pupils       Pupils APD   Right PERRL None   Left PERRL None         Visual Fields       Left Right    Full Full         Extraocular Movement       Right Left    Full Full         Neuro/Psych     Oriented x3: Yes   Mood/Affect: Normal         Dilation     Both eyes: 1.0% Mydriacyl, 2.5% Phenylephrine @ 2:40 PM           Slit Lamp and Fundus Exam     External Exam       Right Left   External Normal Normal         Slit Lamp Exam       Right Left   Lids/Lashes Normal Normal   Conjunctiva/Sclera White and quiet White and quiet   Cornea Clear Clear   Anterior Chamber Deep and quiet Deep and quiet   Iris Round and reactive,pinhole Round and reactive   Lens Clear Centered posterior chamber intraocular lens   Anterior Vitreous Normal Normal         Fundus Exam       Right Left   Posterior Vitreous Posterior vitreous detachment Posterior vitreous detachment, Central vitreous floaters   Disc Normal Normal, collateral vessels on the nerve   C/D  Ratio 0.2 0.35   Macula Hard drusen, Atrophy, no macular thickening, no exudates, no cystoid macular edema,  No Subretinal hemorrhage, no macular thickening, Soft drusen, Geographic atrophy   Vessels Normal, Normal   Periphery Normal Normal            IMAGING AND PROCEDURES  Imaging and Procedures for 05/27/22  OCT, Retina - OU - Both Eyes       Right Eye Quality was good. Scan locations included subfoveal. Central Foveal Thickness: 244. Progression has been stable. Findings include abnormal foveal contour, retinal drusen .   Left Eye Quality was borderline. Scan locations included subfoveal. Central Foveal Thickness: 249. Progression has been stable. Findings include abnormal foveal contour, retinal drusen .   Notes OD, stable overall  OS with much improved perifoveal wet AMD overall post Eylea, 9-week follow-up in the past, stable today at 9-week follow-up repeat injection today and maintain same     Intravitreal Injection, Pharmacologic Agent - OS - Left Eye       Time Out 05/27/2022. 3:20 PM. Confirmed correct patient, procedure, site, and patient consented.   Anesthesia Topical anesthesia was used. Anesthetic medications included Lidocaine 4%.   Procedure Preparation included 5% betadine to ocular surface, 10% betadine to eyelids, Tobramycin 0.3%. A 30 gauge needle was used.   Injection: 2 mg aflibercept 2 MG/0.05ML   Route: Intravitreal, Site: Left Eye   NDC: [id number], Lot: [id number], Expiration date: 02/14/2023, Waste: 0 mL   Post-op Post injection exam found visual acuity of at least counting fingers. The patient tolerated  the procedure well. There were no complications. The patient received written and verbal post procedure care education. Post injection medications included ocuflox.              ASSESSMENT/PLAN:  Advanced nonexudative age-related macular degeneration of right eye without subfoveal involvement OD with no sign of CNVM we will  continue to monitor and observe  Exudative age-related macular degeneration of right eye with inactive choroidal neovascularization (HCC) History of CNVM OD no recurrence  Exudative age-related macular degeneration of left eye with active choroidal neovascularization (HCC) Multiple recurrences left eye with with active disease now stabilized on intravitreal Eylea continues to be maintained at 9-week interval repeat today and reevaluate 9 weeks      ICD-10-CM   1. Exudative age-related macular degeneration of left eye with active choroidal neovascularization (HCC)  H35.3221 OCT, Retina - OU - Both Eyes    Intravitreal Injection, Pharmacologic Agent - OS - Left Eye    aflibercept (EYLEA) SOLN 2 mg    2. Advanced nonexudative age-related macular degeneration of right eye without subfoveal involvement  H35.3113     3. 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  OD continue to observe  2.  OS repeat intravitreal Eylea OS today evaluate again in 9 weeks OS  3.  Ophthalmic Meds Ordered this visit:  Meds ordered this encounter  Medications   aflibercept (EYLEA) SOLN 2 mg       Return in about 9 weeks (around 07/29/2022) for dilate, OS, EYLEA OCT.  There are no Patient Instructions on file for this visit.   Explained the diagnoses, plan, and follow up with the patient and they expressed understanding.  Patient expressed understanding of the importance of proper follow up care.   Alford Highland Elfa Wooton M.D. Diseases & Surgery of the Retina and Vitreous [facility] 05/27/22     Abbreviations: M myopia (nearsighted); A astigmatism; H hyperopia (farsighted); P presbyopia; Mrx spectacle prescription;  CTL contact lenses; OD right eye; OS left eye; OU both eyes  XT exotropia; ET esotropia; PEK punctate epithelial keratitis; PEE punctate epithelial erosions; DES dry eye syndrome; MGD meibomian  gland dysfunction; ATs artificial tears; PFAT's preservative free artificial tears; NSC nuclear sclerotic cataract; PSC posterior subcapsular cataract; ERM epi-retinal membrane; PVD posterior vitreous detachment; RD retinal detachment; DM diabetes mellitus; DR diabetic retinopathy; NPDR non-proliferative diabetic retinopathy; PDR proliferative diabetic retinopathy; CSME clinically significant macular edema; DME diabetic macular edema; dbh dot blot hemorrhages; CWS cotton wool spot; POAG primary open angle glaucoma; C/D cup-to-disc ratio; HVF humphrey visual field; GVF goldmann visual field; OCT optical coherence tomography; IOP intraocular pressure; BRVO Branch retinal vein occlusion; CRVO central retinal vein occlusion; CRAO central retinal artery occlusion; BRAO branch retinal artery occlusion; RT retinal tear; SB scleral buckle; PPV pars plana vitrectomy; VH Vitreous hemorrhage; PRP panretinal laser photocoagulation; IVK intravitreal kenalog; VMT vitreomacular traction; MH Macular hole;  NVD neovascularization of the disc; NVE neovascularization elsewhere; AREDS age related eye disease study; ARMD age related macular degeneration; POAG primary open angle glaucoma; EBMD epithelial/anterior basement membrane dystrophy; ACIOL anterior chamber intraocular lens; IOL intraocular lens; PCIOL posterior chamber intraocular lens; Phaco/IOL phacoemulsification with intraocular lens placement; PRK photorefractive keratectomy; LASIK laser assisted in situ keratomileusis; HTN hypertension; DM diabetes mellitus; COPD chronic obstructive pulmonary disease

## 2022-05-27 NOTE — Assessment & Plan Note (Signed)
OD with no sign of CNVM we will continue to monitor and observe

## 2022-05-27 NOTE — Telephone Encounter (Signed)
Lewayne Bunting, MD  You 7 minutes ago (4:54 PM)    Remain on spironolactone 12.5 mg daily  [name]     Returned call to patients daughter- she is aware and verbalized understanding. Chart updated.

## 2022-05-27 NOTE — Assessment & Plan Note (Signed)
History of CNVM OD no recurrence

## 2022-05-27 NOTE — Assessment & Plan Note (Signed)
Multiple recurrences left eye with with active disease now stabilized on intravitreal Eylea continues to be maintained at 9-week interval repeat today and reevaluate 9 weeks

## 2022-05-27 NOTE — Telephone Encounter (Signed)
Pt c/o medication issue:  1. Name of Medication: Spironolactone  2. How are you currently taking this medication (dosage and times per day)?  Half a tablet every morning  3. Are you having a reaction (difficulty breathing--STAT)?   4. What is your medication issue? When she picked up  the medicine from pharmacy, it said 1 tablet per day- which one is correct?

## 2022-06-01 ENCOUNTER — Encounter: Payer: Self-pay | Admitting: Nurse Practitioner

## 2022-06-01 ENCOUNTER — Ambulatory Visit (INDEPENDENT_AMBULATORY_CARE_PROVIDER_SITE_OTHER): Payer: Medicare Other | Admitting: Cardiology

## 2022-06-01 ENCOUNTER — Other Ambulatory Visit: Payer: Medicare Other | Admitting: Nurse Practitioner

## 2022-06-01 DIAGNOSIS — Z515 Encounter for palliative care: Secondary | ICD-10-CM

## 2022-06-01 DIAGNOSIS — I359 Nonrheumatic aortic valve disorder, unspecified: Secondary | ICD-10-CM

## 2022-06-01 DIAGNOSIS — R630 Anorexia: Secondary | ICD-10-CM

## 2022-06-01 DIAGNOSIS — R0602 Shortness of breath: Secondary | ICD-10-CM

## 2022-06-01 DIAGNOSIS — Z5181 Encounter for therapeutic drug level monitoring: Secondary | ICD-10-CM

## 2022-06-01 DIAGNOSIS — Z952 Presence of prosthetic heart valve: Secondary | ICD-10-CM

## 2022-06-01 DIAGNOSIS — R5381 Other malaise: Secondary | ICD-10-CM

## 2022-06-01 DIAGNOSIS — I6350 Cerebral infarction due to unspecified occlusion or stenosis of unspecified cerebral artery: Secondary | ICD-10-CM

## 2022-06-01 LAB — POCT INR: INR: 3.2 — AB (ref 2.0–3.0)

## 2022-06-01 NOTE — Progress Notes (Signed)
[facility] Consult Note Telephone: [phone]  Fax: [phone]    Date of encounter: 06/01/22 12:21 PM PATIENT NAME: [name] [address] [place] [address]   [phone] (home)  DOB: 1926/07/11 MRN: [id number] PRIMARY CARE PROVIDER:    Celene Squibb, MD,  [place] [address] [phone]  RESPONSIBLE PARTY:    Contact Information     Name Relation Home Work Mobile   [place] Daughter [phone]   [name] Granddaughter [phone]  [phone]   Kristine Linea, Debi Daughter   [place], [place]   [phone]      I met face to face with patient and caregiver in home. Palliative Care was asked to follow this patient by consultation request of  Celene Squibb, MD to address advance care planning and complex medical decision making. This is a follow up visit  Symptom Management/Plan: ACP: DNR; declined workup for trace of blood in stool; avoid hospitalizations   2.  Shortness of breath secondary to COPD, CHF, stable, continue monitoring weights, supplemental O2, We talked about increase use of nebulizer treatments this week. Continue diuretic, f/u visits with Cardiology.  Continue current plan, no edema today;    3. Debility secondary to COPD, CHF, continue strengthening, mobility, encourage self training.    4. Anorexia; weight loss, improved with weight gain. Discussed nutrition, eating lunches out at restaurants and went to [employer] cream social this week. Education; talked about foods she likes to eat, chicken livers. We reviewed foods higher in vitamin k, list with Colleen Henshaw taking coumadin. We talked about higher levels of INR/low levels.  11/04/2021 weight 113 lbs 12/08/2021 weight 114 lbs 12/29/2021 weight 120 lbs 02/24/2022 weight 119 lbs 04/13/2022 weight 122 lbs  06/01/2022 weight 136 lbs Weight gain 4. Palliative  care encounter; Palliative care encounter; Palliative medicine team will continue to support patient, patient's family, and medical team. Visit consisted of counseling and education dealing with the complex and emotionally intense issues of symptom management and palliative care in the setting of serious and potentially life-threatening illness  PPS: 40% Chief Complaint: Follow up palliative consult for complex medical decision making, address goals, manage ongoing symptoms   HISTORY OF PRESENT ILLNESS:  [name] is a 88 y.o. 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 I called [name], [name]'s caregiver to confirm PC f/u visit, [name] in agreement. I visited [name] and Colleen. Simpson in her home. Colleen. Valtierra was sitting in the chair in her living room. Colleen Soave was smiling, interactive. Colleen. Louks endorses she is having a great day. We talked about appetite, weight, ros, symptoms, functional abilities, no recent falls. Colleen. Newsom continues to wear continuous O2. [name] and Colleen. Simpson discussed at length about coumadin, pt/inr; diet, nutrition with weight gain and foods to avoid with coumadin. We talked about ice cream social at church, Colleen. Simpson endorses she had "chocolate". We talked about overall health, daily routine. We talked about [name] and Colleen. Simpson going out to lunch and hoping to have chicken livers with mashed potatoes today, mac and cheese. We talked about going to the gym as she does have a "routine exercising her mouth".  Colleen. Vergara endorses she used to ride a bicycle but has become more weak in her BLE; so it is more of socialization time. We talked about sleeping.  Colleen. Costilow talked about her family; Medical goals reviewed. Discussed f/u pc visit, scheduled. Therapeutic listening, emotional support provided, Questions answered.    Follow up Palliative Care Visit: Palliative care will continue to follow for complex medical decision making, advance care planning, and clarification of goals. Return 8 weeks or prn.  I spent 61 minutes providing this consultation. More than 50% of the time in this consultation was spent in counseling and care coordination. PPS: 50% Chief Complaint: Follow up palliative consult for complex medical decision making, address goals, manage ongoing symptoms History obtained from review of EMR, discussion [place] caregiver with  Colleen. Simpson.  I reviewed available labs, medications, imaging, studies and related documents from the EMR.  Records reviewed and summarized above.   ROS 10 point system reviewed all negative except +Weakness BLE  Physical Exam: Constitutional: NAD General: frail appearing, thin, pleasant female EYES:  lids intact ENMT: oral mucous membranes moist CV: S1S2, RRR, no LE edema Pulmonary: LCTA, no increased work of breathing, no cough, room air Abdomen: soft and non tender,  MSK: ambulatory Skin: warm and dry Neuro:  + BLE weakness,  no cognitive impairment Psych: non-anxious affect, A and O x 3  Thank you for the opportunity to participate in the care of Colleen. Simpson.  The palliative care team will continue to follow. Please call our office at [phone] if we can be of additional assistance.   Kdyn Vonbehren Z Alik Mawson, NP   COVID-19 PATIENT SCREENING TOOL Asked and negative response unless otherwise noted:   Have you had symptoms of covid, tested positive or been in contact with someone with symptoms/positive test in the past 5-10 days? NO

## 2022-06-15 ENCOUNTER — Ambulatory Visit (INDEPENDENT_AMBULATORY_CARE_PROVIDER_SITE_OTHER): Payer: Medicare Other | Admitting: Cardiology

## 2022-06-15 DIAGNOSIS — I359 Nonrheumatic aortic valve disorder, unspecified: Secondary | ICD-10-CM

## 2022-06-15 DIAGNOSIS — Z5181 Encounter for therapeutic drug level monitoring: Secondary | ICD-10-CM | POA: Diagnosis not present

## 2022-06-15 DIAGNOSIS — Z952 Presence of prosthetic heart valve: Secondary | ICD-10-CM

## 2022-06-15 DIAGNOSIS — I6350 Cerebral infarction due to unspecified occlusion or stenosis of unspecified cerebral artery: Secondary | ICD-10-CM

## 2022-06-15 LAB — POCT INR: INR: 2.4 (ref 2.0–3.0)

## 2022-06-18 ENCOUNTER — Ambulatory Visit (INDEPENDENT_AMBULATORY_CARE_PROVIDER_SITE_OTHER): Payer: Medicare Other | Admitting: Internal Medicine

## 2022-06-18 ENCOUNTER — Encounter: Payer: Self-pay | Admitting: Internal Medicine

## 2022-06-18 VITALS — BP 128/62 | HR 68 | Temp 98.3°F | Ht 62.0 in | Wt 118.0 lb

## 2022-06-18 DIAGNOSIS — J449 Chronic obstructive pulmonary disease, unspecified: Secondary | ICD-10-CM | POA: Diagnosis not present

## 2022-06-18 DIAGNOSIS — I6350 Cerebral infarction due to unspecified occlusion or stenosis of unspecified cerebral artery: Secondary | ICD-10-CM

## 2022-06-18 DIAGNOSIS — J9612 Chronic respiratory failure with hypercapnia: Secondary | ICD-10-CM | POA: Diagnosis not present

## 2022-06-18 DIAGNOSIS — J9611 Chronic respiratory failure with hypoxia: Secondary | ICD-10-CM

## 2022-06-18 NOTE — Patient Instructions (Addendum)
My office will be contacting you by phone for referral for pulse oximetry on your 4lpm   - if you don't hear back from my office within one week please call us back or notify us thru MyChart and we'll address it right away.    Make sure you check your oxygen saturation  AT  your highest level of activity (not after you stop)   to be sure it stays over 90% and adjust  02 flow upward to maintain this level if needed but remember to turn it back to previous settings when you stop (to conserve your supply).    Please schedule a follow up visit in 12 months but call sooner if needed

## 2022-06-18 NOTE — Progress Notes (Unsigned)
[name], female    DOB: 07-09-1926,    MRN: [id number]   Brief patient profile:  86 yowf pt of [name] previously - she reports she quit smoking around 1997 s resp symptoms then  and placed on 02 around 2014 with dx of systolic chf and maint on anoro for presumed copd    History of Present Illness  01/01/2020  Pulmonary/ 1st office eval/[name] re severe copd post covid 10/2019 Chief Complaint  Patient presents with   Consult    Former Dr. Juanetta Simpson patient for severe COPD. Currently on 3L. Uses 4L at home. States her breathing has been ok.   Dyspnea: Baseline  goes to gym rides bike x 20 min s stopping on RA does not monitor sats Cough: none now Sleep: no resp ccs x 30 degrees HOB SABA use: neb daily "whether needs it or not "  (says doesn't think she does) rec Plan A = Automatic = Always=    Anoro one click each am  Plan B = Backup (does not replace plan A, it supplements it  - only use your levoalbuterol nebulizer up to every 4 hours if you can't catch your breath  Make sure you check your oxygen saturations at highest level of activity to be sure it stays over 90%   Goal at rest is to keep 02 above 94% at rest for your heart   Admit date: 02/05/2020 Discharge date: 02/09/2020       Home Health: yes Equipment/Devices: HHPT, RN, 3-4L [place]   Discharge Condition: Stable CODE STATUS: DNR Diet recommendation: Heart Healthy     Brief/Interim Summary: 88 year old female with a history of systolic and diastolic CHF, chronic respiratory failure on 4 L oxygen, hyperlipidemia, coronary artery disease, LBBB, coronary disease, CKD stage III, AVR presented with 1 to 2 days of worsening shortness of breath.  The patient states that she has had intermittent shortness of breath for at least 2 weeks with worsening over the last 1 to 2 days prior to admission.  She denies any fevers, chills, chest pain, nausea, vomiting, diarrhea, abdominal pain.  Upon presentation, the patient was noted to be  fluid overloaded with chest x-ray showing diffuse bilateral interstitial prominence and BNP 1712.  She was started on IV furosemide with good clinical effect. During the am 3/25, she became more dyspneic.  Repeat CXR and labs were ordered and duonebs were started.   Discharge Diagnoses:  Acute on chronic systolic and diastolic CHF -pt suppose to take 20 mg po bid at home, but she only takes it qday -01/22/2019 echo EF 25-30%, diffuse HK, moderate MR, moderate AI -Continue IV furosemide--dose increased to 40 bid when pt had resp distress 3/25 -Remains fluid overloaded with JVD -Daily weights -Accurate I's and O's--NEG 3.1L -SARS-CoV2--neg -d/c home with lasix 40 mg daily   Acute on Chronic respiratory failure with hypoxia and hypercarbia -usually on 4L at home -pt on 3L at time of d/c with saturation 97-98% -3/25--repeat CXR--personally reviewed--increased interstitial markings, sm pleural effusions -3/26--sob improved   CKD stage IIIb -Baseline creatinine 0.9-1.2 -Monitor with diuresis -serum creatinine 1.10 on day of d/c   COPD -Stable without wheezing -Discontinued IV steroids 3/24 -continued duonebs -continued pulmicort   Coronary artery disease status post CABG -No chest pain -Continue carvedilol -continue statin   Status post AVR -Continue warfarin with pharmacy assistance   Hypokalemia -Repleted -Magnesium 2.4   Hypertension -Blood pressure acceptable -Continue carvedilol   Cognitive impairment -Continue Aricept  Hyperlipidemia -Continue statin   LBBB -chronic       06/23/2020  f/u ov/[name] re:  COPD Group B / 02 hs and prn Chief Complaint  Patient presents with   Follow-up    No complaints  Dyspnea:  Gym 3 x week bike/ some days does mailbox 75 ft / flat  Cough: none  Sleeping: sleeping 30 degrees   SABA use: neb at hs  02: 3.5 hs and uses p ex/ does not check sats during ex  rec Make sure you check your oxygen saturations at highest level of  activity to be sure it stays over 90% and adjust upward to maintain      12/23/2020  f/u ov/[place] office/[name] re:  GOLD  Group B/  02  4lpm "24/7"  Chief Complaint  Patient presents with   Follow-up    Shortness of breath with activity  Dyspnea: walking around the house ok/ still doing some gym / worse bending over or taking a shower Cough: none Sleeping: hosp bed about 30 degrees  SABA use: just hs  02:  4 lpm but not titrating, still wearing at rest.  Covid status: vax max  Lung cancer screening: n/a Rec Make sure you check your oxygen saturation  at your highest level of activity  to be sure it stays over 90% and adjust  02 flow upward to maintain this level if needed but remember to turn it back to previous settings when you stop (to conserve your supply).  For flares of difficulty breathing or arthritis 2 daily until better then taper off gradually     06/18/2022  f/u ov/[facility] office/[name] re: COPD Group B maint on Anoro  and prn medrol but  not needed this year for arthritis or "colds in chest"  Chief Complaint  Patient presents with   Follow-up    Breathing is about the same since last ov. 3LO2 pulse at home 4-4.5LO2 cont   Dyspnea:  stationery bike / room to room but not checking 02 sats as rec Cough: none  Sleeping: 30 degrees hosp bed at 4lpm and wakes up refreshed  SABA use: not needing  02: 4 lpm at home on conc    No obvious day to day or daytime variability or assoc excess/ purulent sputum or mucus plugs or hemoptysis or cp or chest tightness, subjective wheeze or overt sinus or hb symptoms.   Sleeping as above without nocturnal  or early am exacerbation  of respiratory  c/o's or need for noct saba. Also denies any obvious fluctuation of symptoms with weather or environmental changes or other aggravating or alleviating factors except as outlined above   No unusual exposure hx or h/o childhood pna/ asthma or knowledge of premature birth.  Current Allergies,  Complete Past Medical History, Past Surgical History, Family History, and Social History were reviewed in [employer] record.  ROS  The following are not active complaints unless bolded Hoarseness, sore throat, dysphagia, dental problems, itching, sneezing,  nasal congestion or discharge of excess mucus or purulent secretions, ear ache,   fever, chills, sweats, unintended wt loss or wt gain, classically pleuritic or exertional cp,  orthopnea pnd or arm/hand swelling  or leg swelling, presyncope, palpitations, abdominal pain, anorexia, nausea, vomiting, diarrhea  or change in bowel habits or change in bladder habits, change in stools or change in urine, dysuria, hematuria,  rash, arthralgias, visual complaints, headache, numbness, weakness or ataxia or problems with walking or coordination,  change in mood  or  memory.        Current Meds  Medication Sig   acetaminophen (TYLENOL) 650 MG CR tablet Take 650 mg by mouth every 8 (eight) hours as needed for pain.   Aflibercept (EYLEA) 2 MG/0.05ML SOLN by Intravitreal route. Every 9 weeks   ALPRAZolam (XANAX) 0.25 MG tablet Take 0.125-0.25 mg by mouth at bedtime as needed for anxiety.    atorvastatin (LIPITOR) 40 MG tablet TAKE ONE TABLET BY MOUTH ONCE DAILY.   baclofen (LIORESAL) 10 MG tablet Take 10 mg by mouth as needed for muscle spasms.   Cholecalciferol (VITAMIN D3 PO) Take by mouth once a week. On sundays   docusate sodium (COLACE) 100 MG capsule Take 100 mg by mouth as needed.   donepezil (ARICEPT) 10 MG tablet TAKE ONE TABLET BY MOUTH ONCE DAILY. (Patient taking differently: Take 10 mg by mouth at bedtime.)   ferrous sulfate 325 (65 FE) MG EC tablet Take 325 mg by mouth daily.    furosemide (LASIX) 40 MG tablet Take 1.5 tablets (60 mg total) by mouth daily.   HYDROcodone-acetaminophen (NORCO) 7.5-325 MG tablet Take 1 tablet by mouth every 6 (six) hours as needed for moderate pain.   hydrocortisone (ANUSOL-HC) 2.5 % rectal  cream Place 1 application rectally 3 (three) times daily as needed for hemorrhoids or anal itching.   levalbuterol (XOPENEX) 0.63 MG/3ML nebulizer solution USE 1 VIAL VIA NEBULIZER EVERY 8 HOURS AS NEEDED.   meclizine (ANTIVERT) 25 MG tablet Take 25 mg by mouth 3 (three) times daily as needed for dizziness.   Melatonin 3 MG TABS Take 1 tablet by mouth at bedtime.   methylPREDNISolone (MEDROL) 4 MG tablet Take 4 mg by mouth 2 (two) times daily as needed.   multivitamin-lutein (OCUVITE-LUTEIN) CAPS capsule Take 1 capsule by mouth daily.   ondansetron (ZOFRAN) 4 MG tablet Take 4 mg by mouth every 8 (eight) hours as needed for nausea or vomiting.   polyethylene glycol (MIRALAX / GLYCOLAX) 17 g packet Take 17 g by mouth daily as needed for moderate constipation.   potassium chloride (KLOR-CON) 10 MEQ tablet Take 2 tablets (20 mEq total) by mouth daily.   pregabalin (LYRICA) 75 MG capsule TAKE 1 CAPSULE BY MOUTH EVERY OTHER DAY.   spironolactone (ALDACTONE) 25 MG tablet Take 0.5 tablets (12.5 mg total) by mouth daily.   traZODone (DESYREL) 50 MG tablet Take 0.5-1 tablets (25-50 mg total) by mouth at bedtime as needed for sleep.   umeclidinium-vilanterol (ANORO ELLIPTA) 62.5-25 MCG/ACT AEPB USE 1 INHALATION ONCE DAILY.   warfarin (COUMADIN) 5 MG tablet TAKE 1-2 TABLETS DAILY OR AS PRESCRIBED BY COUMADIN CLINIC   zinc sulfate 50 MG CAPS capsule Take 50 mg by mouth 2 (two) times daily.                        Past Medical History:  Diagnosis Date   Anemia    Anxiety    Arthritis    Blood transfusion without reported diagnosis    Carotid artery stenosis    Without infarction   Cataract    Chronic combined systolic (congestive) and diastolic (congestive) heart failure (HCC)    COPD (chronic obstructive pulmonary disease) (HCC)    Coronary artery disease    Heart valve replaced by other means    Hypercholesterolemia    Pure   Hypertension    Unspecified   LBBB (left bundle branch block)     Macular degeneration (senile) of retina,  unspecified    On home O2    Postsurgical aortocoronary bypass status    Stroke [facility])    Transient global amnesia    Unspecified hereditary and idiopathic peripheral neuropathy    Unspecified vitamin D deficiency        Objective:     06/18/2022          118 06/22/2021          115  12/23/2020          115 06/23/2020          115   03/26/20 112 lb (50.8 kg)  03/18/20 109 lb (49.4 kg)  03/03/20 111 lb 9.6 oz (50.6 kg)       Vital signs reviewed  06/18/2022  - Note at rest 02 sats  100% on RA   W/c bound pleasant elderly wf nad    HEENT : Oropharynx  clear   Nasal turbinates nl    NECK :  without  apparent JVD/ palpable Nodes/TM    LUNGS: no acc muscle use,  Min barrel /kyphotic  contour chest wall with bilateral  slightly decreased bs s audible wheeze and  without cough on insp or exp maneuvers and min  Hyperresonant  to  percussion bilaterally    CV:  RRR  no s3 - mechanical S2  2/6 sem s increase in P2, and no edema   ABD:  soft and nontender with pos end  insp Hoover's  in the supine position.  No bruits or organomegaly appreciated   MS:  Nl gait/ ext warm without deformities Or obvious joint restrictions  calf tenderness, cyanosis or clubbing     SKIN: warm and dry without lesions    NEURO:  alert, approp, nl sensorium with  no motor or cerebellar deficits apparent.                       Assessment

## 2022-06-19 ENCOUNTER — Encounter: Payer: Self-pay | Admitting: Internal Medicine

## 2022-06-19 NOTE — Assessment & Plan Note (Addendum)
Placed on 02 2014 by Dr [name]  - advised 01/01/2020 :  Monitor sats with goal of > 94% at rest and keep > 90% with ex -  HC03  03/04/20   =  36  -  06/23/2020   Walked RA  approx   300 ft  @ avg pace  stopped due to end of study, no sob with sats 98% at end     Advised goal is sats > 90% with exertion   Also needs ono on 4lpm to see if we can adjust that down a bit at this point   F/u can be yearly, sooner if needed           Each maintenance medication was reviewed in detail including emphasizing most importantly the difference between maintenance and prns and under what circumstances the prns are to be triggered using an action plan format where appropriate.  Total time for H and P, chart review, counseling, reviewing dpi, neb/ 02  device(s) and generating customized AVS unique to this office visit / same day charting = 21 min

## 2022-06-19 NOTE — Assessment & Plan Note (Signed)
Quit smoking around 1997  - maint on anoro and prn xopenex neb as of ov  06/23/2020   No spirometry avaialbe but Pt is probably  Group B in terms of symptom/risk and laba/lama therefore appropriate rx at this point >>>  Continue anoro/ prn xopenex and short course medrol prn since this is rarely needed and uses for arthritis anyway in low doses.

## 2022-06-29 ENCOUNTER — Ambulatory Visit (INDEPENDENT_AMBULATORY_CARE_PROVIDER_SITE_OTHER): Payer: Medicare Other | Admitting: Internal Medicine

## 2022-06-29 DIAGNOSIS — Z5181 Encounter for therapeutic drug level monitoring: Secondary | ICD-10-CM

## 2022-06-29 DIAGNOSIS — I359 Nonrheumatic aortic valve disorder, unspecified: Secondary | ICD-10-CM

## 2022-06-29 DIAGNOSIS — I6350 Cerebral infarction due to unspecified occlusion or stenosis of unspecified cerebral artery: Secondary | ICD-10-CM

## 2022-06-29 DIAGNOSIS — Z952 Presence of prosthetic heart valve: Secondary | ICD-10-CM

## 2022-06-29 LAB — POCT INR: INR: 3.4 — AB (ref 2.0–3.0)

## 2022-07-06 ENCOUNTER — Ambulatory Visit (INDEPENDENT_AMBULATORY_CARE_PROVIDER_SITE_OTHER): Payer: Medicare Other | Admitting: *Deleted

## 2022-07-06 DIAGNOSIS — Z5181 Encounter for therapeutic drug level monitoring: Secondary | ICD-10-CM

## 2022-07-06 LAB — POCT INR: INR: 2.1 (ref 2.0–3.0)

## 2022-07-06 NOTE — Patient Instructions (Signed)
Description   Spoke with daughter, [name], on [place], instructed pt to take 1 tablet today then continue warfarin 1/2 tablet daily EXCEPT for 1 tablet on Monday, Wednesday and Friday. Recheck INR in 1 week.   *PLEASE talk to daughter [name] for dosing instructions* *Home meter, checks every 2 weeks*

## 2022-07-13 ENCOUNTER — Other Ambulatory Visit: Payer: Self-pay | Admitting: Cardiology

## 2022-07-13 ENCOUNTER — Telehealth: Payer: Self-pay | Admitting: Cardiology

## 2022-07-13 ENCOUNTER — Ambulatory Visit (INDEPENDENT_AMBULATORY_CARE_PROVIDER_SITE_OTHER): Payer: Medicare Other | Admitting: *Deleted

## 2022-07-13 DIAGNOSIS — I359 Nonrheumatic aortic valve disorder, unspecified: Secondary | ICD-10-CM | POA: Diagnosis not present

## 2022-07-13 DIAGNOSIS — Z5181 Encounter for therapeutic drug level monitoring: Secondary | ICD-10-CM | POA: Diagnosis not present

## 2022-07-13 DIAGNOSIS — Z952 Presence of prosthetic heart valve: Secondary | ICD-10-CM

## 2022-07-13 DIAGNOSIS — I6350 Cerebral infarction due to unspecified occlusion or stenosis of unspecified cerebral artery: Secondary | ICD-10-CM | POA: Diagnosis not present

## 2022-07-13 LAB — POCT INR: INR: 3 (ref 2.0–3.0)

## 2022-07-13 NOTE — Telephone Encounter (Signed)
Will forward for dr crenshaw review  

## 2022-07-13 NOTE — Patient Instructions (Signed)
Description   Spoke with daughter, [name], on [place], instructed pt to continue warfarin 1/2 tablet daily EXCEPT for 1 tablet on Monday, Wednesday and Friday. Recheck INR in 2 weeks.   *PLEASE talk to daughter [name] for dosing instructions* *Home meter, checks every 2 weeks*

## 2022-07-13 NOTE — Telephone Encounter (Signed)
Pt c/o medication issue:  1. Name of Medication:   furosemide (LASIX) 40 MG tablet    potassium chloride (KLOR-CON) 10 MEQ tablet   2. How are you currently taking this medication (dosage and times per day)?   3. Are you having a reaction (difficulty breathing--STAT)? NO  4. What is your medication issue? PCP calling to make provider aware of above med changes. Lasix - Down to 1/2 a tablet Potassium - Down to 1 tablet  Pt's Creatine was up (1.56), also states that pt is to monitor weight daily and will recheck in 3 weeks. Please advise

## 2022-07-14 ENCOUNTER — Emergency Department (HOSPITAL_COMMUNITY): Payer: Medicare Other

## 2022-07-14 ENCOUNTER — Inpatient Hospital Stay (HOSPITAL_COMMUNITY): Payer: Medicare Other

## 2022-07-14 ENCOUNTER — Other Ambulatory Visit: Payer: Self-pay

## 2022-07-14 ENCOUNTER — Encounter (HOSPITAL_COMMUNITY): Payer: Self-pay

## 2022-07-14 ENCOUNTER — Inpatient Hospital Stay (HOSPITAL_COMMUNITY)
Admission: EM | Admit: 2022-07-14 | Discharge: 2022-07-16 | DRG: 189 | Disposition: A | Discharge status: Hospice | Payer: Medicare Other | Attending: Internal Medicine | Admitting: Internal Medicine

## 2022-07-14 DIAGNOSIS — I447 Left bundle-branch block, unspecified: Secondary | ICD-10-CM | POA: Diagnosis present

## 2022-07-14 DIAGNOSIS — Z952 Presence of prosthetic heart valve: Secondary | ICD-10-CM

## 2022-07-14 DIAGNOSIS — J449 Chronic obstructive pulmonary disease, unspecified: Secondary | ICD-10-CM | POA: Diagnosis present

## 2022-07-14 DIAGNOSIS — Z20822 Contact with and (suspected) exposure to covid-19: Secondary | ICD-10-CM | POA: Diagnosis present

## 2022-07-14 DIAGNOSIS — Z8673 Personal history of transient ischemic attack (TIA), and cerebral infarction without residual deficits: Secondary | ICD-10-CM

## 2022-07-14 DIAGNOSIS — Z9049 Acquired absence of other specified parts of digestive tract: Secondary | ICD-10-CM | POA: Diagnosis not present

## 2022-07-14 DIAGNOSIS — I130 Hypertensive heart and chronic kidney disease with heart failure and stage 1 through stage 4 chronic kidney disease, or unspecified chronic kidney disease: Secondary | ICD-10-CM | POA: Diagnosis present

## 2022-07-14 DIAGNOSIS — Z951 Presence of aortocoronary bypass graft: Secondary | ICD-10-CM

## 2022-07-14 DIAGNOSIS — N1832 Chronic kidney disease, stage 3b: Secondary | ICD-10-CM | POA: Diagnosis present

## 2022-07-14 DIAGNOSIS — J9622 Acute and chronic respiratory failure with hypercapnia: Secondary | ICD-10-CM | POA: Diagnosis present

## 2022-07-14 DIAGNOSIS — Z885 Allergy status to narcotic agent status: Secondary | ICD-10-CM

## 2022-07-14 DIAGNOSIS — I10 Essential (primary) hypertension: Secondary | ICD-10-CM | POA: Diagnosis present

## 2022-07-14 DIAGNOSIS — F0394 Unspecified dementia, unspecified severity, with anxiety: Secondary | ICD-10-CM | POA: Diagnosis present

## 2022-07-14 DIAGNOSIS — J9601 Acute respiratory failure with hypoxia: Principal | ICD-10-CM | POA: Diagnosis present

## 2022-07-14 DIAGNOSIS — Z9981 Dependence on supplemental oxygen: Secondary | ICD-10-CM

## 2022-07-14 DIAGNOSIS — G609 Hereditary and idiopathic neuropathy, unspecified: Secondary | ICD-10-CM | POA: Diagnosis present

## 2022-07-14 DIAGNOSIS — N183 Chronic kidney disease, stage 3 unspecified: Secondary | ICD-10-CM | POA: Diagnosis present

## 2022-07-14 DIAGNOSIS — Z7189 Other specified counseling: Secondary | ICD-10-CM | POA: Diagnosis not present

## 2022-07-14 DIAGNOSIS — F0390 Unspecified dementia without behavioral disturbance: Secondary | ICD-10-CM | POA: Diagnosis present

## 2022-07-14 DIAGNOSIS — Z880 Allergy status to penicillin: Secondary | ICD-10-CM

## 2022-07-14 DIAGNOSIS — I2510 Atherosclerotic heart disease of native coronary artery without angina pectoris: Secondary | ICD-10-CM | POA: Diagnosis present

## 2022-07-14 DIAGNOSIS — E785 Hyperlipidemia, unspecified: Secondary | ICD-10-CM | POA: Diagnosis present

## 2022-07-14 DIAGNOSIS — Z66 Do not resuscitate: Secondary | ICD-10-CM | POA: Diagnosis present

## 2022-07-14 DIAGNOSIS — Z79899 Other long term (current) drug therapy: Secondary | ICD-10-CM | POA: Diagnosis not present

## 2022-07-14 DIAGNOSIS — H3530 Unspecified macular degeneration: Secondary | ICD-10-CM | POA: Diagnosis present

## 2022-07-14 DIAGNOSIS — W19XXXA Unspecified fall, initial encounter: Secondary | ICD-10-CM | POA: Diagnosis present

## 2022-07-14 DIAGNOSIS — I25810 Atherosclerosis of coronary artery bypass graft(s) without angina pectoris: Secondary | ICD-10-CM | POA: Diagnosis present

## 2022-07-14 DIAGNOSIS — E7800 Pure hypercholesterolemia, unspecified: Secondary | ICD-10-CM | POA: Diagnosis present

## 2022-07-14 DIAGNOSIS — G9340 Encephalopathy, unspecified: Secondary | ICD-10-CM | POA: Diagnosis present

## 2022-07-14 DIAGNOSIS — Z8249 Family history of ischemic heart disease and other diseases of the circulatory system: Secondary | ICD-10-CM

## 2022-07-14 DIAGNOSIS — R402421 Glasgow coma scale score 9-12, in the field [EMT or ambulance]: Principal | ICD-10-CM

## 2022-07-14 DIAGNOSIS — Z515 Encounter for palliative care: Secondary | ICD-10-CM

## 2022-07-14 DIAGNOSIS — I5021 Acute systolic (congestive) heart failure: Secondary | ICD-10-CM | POA: Diagnosis present

## 2022-07-14 DIAGNOSIS — Z87891 Personal history of nicotine dependence: Secondary | ICD-10-CM

## 2022-07-14 LAB — CBC
HCT: 31.8 % — ABNORMAL LOW (ref 36.0–46.0)
Hemoglobin: 9.8 g/dL — ABNORMAL LOW (ref 12.0–15.0)
MCH: 30.3 pg (ref 26.0–34.0)
MCHC: 30.8 g/dL (ref 30.0–36.0)
MCV: 98.5 fL (ref 80.0–100.0)
Platelets: 171 10*3/uL (ref 150–400)
RBC: 3.23 MIL/uL — ABNORMAL LOW (ref 3.87–5.11)
RDW: 14.2 % (ref 11.5–15.5)
WBC: 10.6 10*3/uL — ABNORMAL HIGH (ref 4.0–10.5)
nRBC: 0 % (ref 0.0–0.2)

## 2022-07-14 LAB — BLOOD GAS, VENOUS
Acid-Base Excess: 3.6 mmol/L — ABNORMAL HIGH (ref 0.0–2.0)
Bicarbonate: 31.7 mmol/L — ABNORMAL HIGH (ref 20.0–28.0)
Drawn by: 442
O2 Saturation: 34.1 %
Patient temperature: 36.7
pCO2, Ven: 62 mmHg — ABNORMAL HIGH (ref 44–60)
pH, Ven: 7.31 (ref 7.25–7.43)
pO2, Ven: <31 mmHg — CL (ref 32–45)

## 2022-07-14 LAB — LACTIC ACID, PLASMA
Lactic Acid, Venous: 1.3 mmol/L (ref 0.5–1.9)
Lactic Acid, Venous: 2.9 mmol/L (ref 0.5–1.9)
Lactic Acid, Venous: 2.9 mmol/L (ref 0.5–1.9)

## 2022-07-14 LAB — RAPID URINE DRUG SCREEN, HOSP PERFORMED
Amphetamines: NOT DETECTED
Barbiturates: NOT DETECTED
Benzodiazepines: POSITIVE — AB
Cocaine: NOT DETECTED
Opiates: NOT DETECTED
Tetrahydrocannabinol: NOT DETECTED

## 2022-07-14 LAB — URINALYSIS, ROUTINE W REFLEX MICROSCOPIC
Bilirubin Urine: NEGATIVE
Glucose, UA: NEGATIVE mg/dL
Hgb urine dipstick: NEGATIVE
Ketones, ur: NEGATIVE mg/dL
Leukocytes,Ua: NEGATIVE
Nitrite: NEGATIVE
Protein, ur: 100 mg/dL — AB
Specific Gravity, Urine: 1.02 (ref 1.005–1.030)
pH: 5 (ref 5.0–8.0)

## 2022-07-14 LAB — TSH: TSH: 2.535 u[IU]/mL (ref 0.350–4.500)

## 2022-07-14 LAB — D-DIMER, QUANTITATIVE: D-Dimer, Quant: 2.87 ug/mL-FEU — ABNORMAL HIGH (ref 0.00–0.50)

## 2022-07-14 LAB — COMPREHENSIVE METABOLIC PANEL
ALT: 21 U/L (ref 0–44)
AST: 28 U/L (ref 15–41)
Albumin: 4 g/dL (ref 3.5–5.0)
Alkaline Phosphatase: 81 U/L (ref 38–126)
Anion gap: 8 (ref 5–15)
BUN: 30 mg/dL — ABNORMAL HIGH (ref 8–23)
CO2: 27 mmol/L (ref 22–32)
Calcium: 9.2 mg/dL (ref 8.9–10.3)
Chloride: 108 mmol/L (ref 98–111)
Creatinine, Ser: 1.54 mg/dL — ABNORMAL HIGH (ref 0.44–1.00)
GFR, Estimated: 31 mL/min — ABNORMAL LOW (ref >60)
Glucose, Bld: 112 mg/dL — ABNORMAL HIGH (ref 70–99)
Potassium: 4.4 mmol/L (ref 3.5–5.1)
Sodium: 143 mmol/L (ref 135–145)
Total Bilirubin: 1 mg/dL (ref 0.3–1.2)
Total Protein: 7.5 g/dL (ref 6.5–8.1)

## 2022-07-14 LAB — DIFFERENTIAL
Abs Immature Granulocytes: 0.02 10*3/uL (ref 0.00–0.07)
Basophils Absolute: 0 10*3/uL (ref 0.0–0.1)
Basophils Relative: 0 %
Eosinophils Absolute: 0.6 10*3/uL — ABNORMAL HIGH (ref 0.0–0.5)
Eosinophils Relative: 6 %
Immature Granulocytes: 0 %
Lymphocytes Relative: 11 %
Lymphs Abs: 1.2 10*3/uL (ref 0.7–4.0)
Monocytes Absolute: 0.8 10*3/uL (ref 0.1–1.0)
Monocytes Relative: 7 %
Neutro Abs: 8 10*3/uL — ABNORMAL HIGH (ref 1.7–7.7)
Neutrophils Relative %: 76 %

## 2022-07-14 LAB — ETHANOL: Alcohol, Ethyl (B): <10 mg/dL (ref <10)

## 2022-07-14 LAB — RESP PANEL BY RT-PCR (FLU A&B, COVID) ARPGX2
Influenza A by PCR: NEGATIVE
Influenza B by PCR: NEGATIVE
SARS Coronavirus 2 by RT PCR: NEGATIVE

## 2022-07-14 LAB — I-STAT CHEM 8, ED
BUN: 32 mg/dL — ABNORMAL HIGH (ref 8–23)
Calcium, Ion: 1.05 mmol/L — ABNORMAL LOW (ref 1.15–1.40)
Chloride: 110 mmol/L (ref 98–111)
Creatinine, Ser: 1.7 mg/dL — ABNORMAL HIGH (ref 0.44–1.00)
Glucose, Bld: 115 mg/dL — ABNORMAL HIGH (ref 70–99)
HCT: 29 % — ABNORMAL LOW (ref 36.0–46.0)
Hemoglobin: 9.9 g/dL — ABNORMAL LOW (ref 12.0–15.0)
Potassium: 4.1 mmol/L (ref 3.5–5.1)
Sodium: 142 mmol/L (ref 135–145)
TCO2: 24 mmol/L (ref 22–32)

## 2022-07-14 LAB — PROTIME-INR
INR: 3.4 — ABNORMAL HIGH (ref 0.8–1.2)
Prothrombin Time: 34.3 seconds — ABNORMAL HIGH (ref 11.4–15.2)

## 2022-07-14 LAB — APTT: aPTT: 74 seconds — ABNORMAL HIGH (ref 24–36)

## 2022-07-14 LAB — BRAIN NATRIURETIC PEPTIDE: B Natriuretic Peptide: 825 pg/mL — ABNORMAL HIGH (ref 0.0–100.0)

## 2022-07-14 LAB — TROPONIN I (HIGH SENSITIVITY)
Troponin I (High Sensitivity): 35 ng/L — ABNORMAL HIGH (ref <18)
Troponin I (High Sensitivity): 62 ng/L — ABNORMAL HIGH (ref <18)

## 2022-07-14 LAB — CBG MONITORING, ED: Glucose-Capillary: 100 mg/dL — ABNORMAL HIGH (ref 70–99)

## 2022-07-14 LAB — GLUCOSE, CAPILLARY: Glucose-Capillary: 149 mg/dL — ABNORMAL HIGH (ref 70–99)

## 2022-07-14 MED ORDER — BUDESONIDE 0.25 MG/2ML IN SUSP
0.2500 mg | Freq: Two times a day (BID) | RESPIRATORY_TRACT | Status: DC
  Administered 2022-07-14 – 2022-07-16 (×4): 0.25 mg via RESPIRATORY_TRACT
  Filled 2022-07-14 (×5): qty 2

## 2022-07-14 MED ORDER — MORPHINE SULFATE (PF) 2 MG/ML IV SOLN
2.0000 mg | INTRAVENOUS | Status: DC | PRN
  Administered 2022-07-15 – 2022-07-16 (×6): 2 mg via INTRAVENOUS
  Filled 2022-07-14 (×6): qty 1

## 2022-07-14 MED ORDER — METRONIDAZOLE 500 MG/100ML IV SOLN
500.0000 mg | Freq: Once | INTRAVENOUS | Status: AC
Start: 2022-07-14 — End: 2022-07-14
  Administered 2022-07-14: 500 mg via INTRAVENOUS
  Filled 2022-07-14: qty 100

## 2022-07-14 MED ORDER — FUROSEMIDE 10 MG/ML IJ SOLN
40.0000 mg | Freq: Once | INTRAMUSCULAR | Status: AC
  Administered 2022-07-14: 40 mg via INTRAVENOUS
  Filled 2022-07-14: qty 4

## 2022-07-14 MED ORDER — SODIUM CHLORIDE 0.9 % IV SOLN
2.0000 g | Freq: Once | INTRAVENOUS | Status: AC
  Administered 2022-07-14: 2 g via INTRAVENOUS
  Filled 2022-07-14: qty 12.5

## 2022-07-14 MED ORDER — LACTATED RINGERS IV BOLUS
1000.0000 mL | Freq: Once | INTRAVENOUS | Status: AC
  Administered 2022-07-14: 1000 mL via INTRAVENOUS

## 2022-07-14 MED ORDER — ONDANSETRON HCL 4 MG/2ML IJ SOLN: 4.0000 mg | Freq: Four times a day (QID) | INTRAMUSCULAR | Status: DC | PRN

## 2022-07-14 MED ORDER — IPRATROPIUM-ALBUTEROL 0.5-2.5 (3) MG/3ML IN SOLN
3.0000 mL | Freq: Once | RESPIRATORY_TRACT | Status: AC
  Administered 2022-07-14: 3 mL via RESPIRATORY_TRACT
  Filled 2022-07-14: qty 3

## 2022-07-14 MED ORDER — METHYLPREDNISOLONE SODIUM SUCC 40 MG IJ SOLR
40.0000 mg | Freq: Two times a day (BID) | INTRAMUSCULAR | Status: DC
  Administered 2022-07-14 – 2022-07-16 (×4): 40 mg via INTRAVENOUS
  Filled 2022-07-14 (×4): qty 1

## 2022-07-14 MED ORDER — METHYLPREDNISOLONE SODIUM SUCC 125 MG IJ SOLR
125.0000 mg | Freq: Once | INTRAMUSCULAR | Status: AC
  Administered 2022-07-14: 125 mg via INTRAVENOUS
  Filled 2022-07-14: qty 2

## 2022-07-14 MED ORDER — IPRATROPIUM-ALBUTEROL 0.5-2.5 (3) MG/3ML IN SOLN
3.0000 mL | Freq: Four times a day (QID) | RESPIRATORY_TRACT | Status: DC
  Administered 2022-07-14 – 2022-07-16 (×6): 3 mL via RESPIRATORY_TRACT
  Filled 2022-07-14 (×6): qty 3

## 2022-07-14 MED ORDER — ORAL CARE MOUTH RINSE: 15.0000 mL | OROMUCOSAL | Status: DC | PRN

## 2022-07-14 MED ORDER — LACTATED RINGERS IV SOLN: INTRAVENOUS | Status: DC

## 2022-07-14 MED ORDER — ACETAMINOPHEN 325 MG PO TABS: 650.0000 mg | ORAL_TABLET | Freq: Four times a day (QID) | ORAL | Status: DC | PRN

## 2022-07-14 MED ORDER — ONDANSETRON HCL 4 MG PO TABS: 4.0000 mg | ORAL_TABLET | Freq: Four times a day (QID) | ORAL | Status: DC | PRN

## 2022-07-14 MED ORDER — WARFARIN - PHARMACIST DOSING INPATIENT: Freq: Every day | Status: DC

## 2022-07-14 MED ORDER — VANCOMYCIN HCL 750 MG/150ML IV SOLN: 750.0000 mg | INTRAVENOUS | Status: DC

## 2022-07-14 MED ORDER — LEVALBUTEROL HCL 0.63 MG/3ML IN NEBU: 0.6300 mg | INHALATION_SOLUTION | Freq: Four times a day (QID) | RESPIRATORY_TRACT | Status: DC | PRN

## 2022-07-14 MED ORDER — IOHEXOL 350 MG/ML SOLN
100.0000 mL | Freq: Once | INTRAVENOUS | Status: AC | PRN
Start: 2022-07-14 — End: 2022-07-14
  Administered 2022-07-14: 60 mL via INTRAVENOUS

## 2022-07-14 MED ORDER — CHLORHEXIDINE GLUCONATE CLOTH 2 % EX PADS
6.0000 | MEDICATED_PAD | Freq: Every day | CUTANEOUS | Status: DC
Start: 2022-07-15 — End: 2022-07-14

## 2022-07-14 MED ORDER — ARFORMOTEROL TARTRATE 15 MCG/2ML IN NEBU
15.0000 ug | INHALATION_SOLUTION | Freq: Two times a day (BID) | RESPIRATORY_TRACT | Status: DC
  Administered 2022-07-14 – 2022-07-16 (×4): 15 ug via RESPIRATORY_TRACT
  Filled 2022-07-14 (×5): qty 2

## 2022-07-14 MED ORDER — SODIUM CHLORIDE 0.9 % IV SOLN
2.0000 g | INTRAVENOUS | Status: DC
  Administered 2022-07-15: 2 g via INTRAVENOUS
  Filled 2022-07-14: qty 12.5

## 2022-07-14 MED ORDER — VANCOMYCIN HCL IN DEXTROSE 1-5 GM/200ML-% IV SOLN
1000.0000 mg | Freq: Once | INTRAVENOUS | Status: AC
Start: 2022-07-14 — End: 2022-07-14
  Administered 2022-07-14: 1000 mg via INTRAVENOUS
  Filled 2022-07-14: qty 200

## 2022-07-14 MED ORDER — ORAL CARE MOUTH RINSE: 15.0000 mL | OROMUCOSAL | Status: DC

## 2022-07-14 MED ORDER — ACETAMINOPHEN 650 MG RE SUPP: 650.0000 mg | Freq: Four times a day (QID) | RECTAL | Status: DC | PRN

## 2022-07-14 NOTE — ED Notes (Signed)
Pt orally suctioned of white foam. Neb complete, returned to NRB. IVF and ABT continue. VSS. No urine output in purewick at this time. Family x5 at [facility]. Pt to CT

## 2022-07-14 NOTE — ED Notes (Signed)
Lab present. Alert, NAD, restless, anxious, mildly tachypneic on NRB 15L. Follows some commands. Speech unintelligible.

## 2022-07-14 NOTE — ED Notes (Signed)
Lab at BS 

## 2022-07-14 NOTE — ED Notes (Signed)
Upper airway noises, gurgling, oropharynx suctioned, white foam.

## 2022-07-14 NOTE — Progress Notes (Signed)
Pharmacy Antibiotic Note  [name] is a 88 y.o. female admitted on 07/14/2022 with  unknown source of infection .  Pharmacy has been consulted for vancomycin and cefepime dosing.  Plan: Vancomycin 1000 mg IV x 1 dose. Vancomycin 750 mg IV every 48 hours. Cefepime 2000 mg IV every 24 hours. Monitor labs, c/s, and vanco level as indicated.  Height: 5\' 2"  (157.5 cm) Weight: 53.5 kg (118 lb) IBW/kg (Calculated) : 50.1  Temp (24hrs), Avg:98.1 F (36.7 C), Min:98.1 F (36.7 C), Max:98.1 F (36.7 C)  Recent Labs  Lab 07/14/22 1040 07/14/22 1104 07/14/22 1110 07/14/22 1232  WBC 10.6*  --   --   --   CREATININE 1.54*  --  1.70*  --   LATICACIDVEN  --  1.3  --  2.9*    Estimated Creatinine Clearance: 16.4 mL/min (A) (by C-G formula based on SCr of 1.7 mg/dL (H)).    Allergies  Allergen Reactions   Codeine Nausea And Vomiting   Penicillins Other (See Comments)    Unknown reaction - listed on MAR Has patient had a PCN reaction causing immediate rash, facial/tongue/throat swelling, SOB or lightheadedness with hypotension: Unknown Has patient had a PCN reaction causing severe rash involving mucus membranes or skin necrosis: Unknown Has patient had a PCN reaction that required hospitalization: Unknown Has patient had a PCN reaction occurring within the last 10 years: Unknown If all of the above answers are "NO", then may proceed with Cephalosporin use.    Antimicrobials this admission: Vanco 8/30 >> Cefepime 8/30 >> Flagyl 8/30  Microbiology results: 8/30 BCx: pending   Thank you for allowing pharmacy to be a part of this patient's care.  9/30, PharmD Clinical Pharmacist 07/14/2022 2:25 PM

## 2022-07-14 NOTE — ED Notes (Signed)
EDP speaking with family at [facility].

## 2022-07-14 NOTE — ED Triage Notes (Signed)
Per EMS, Pt, from home, presents w/ AMS.  Normally A & Ox4.  Pinpoint pupils noted.  Pt had a fall yesterday and was seen by PCP. Pt is on blood thinners.  Family denies Pt hitting head.   LKN 2300.

## 2022-07-14 NOTE — H&P (Signed)
History and Physical    [name] [id number] DOB: 10-Mar-1926 DOA: 07/14/2022  PCP: Celene Squibb, MD  Patient coming from: home  I have personally briefly reviewed patient's old medical records in [place]  Chief Complaint: altered mental status  HPI: [name] is a 88 y.o. female with medical history significant of COPD on home oxygen, chronic diastolic congestive heart failure, mechanical aortic valve replacement on anticoagulation, chronic kidney disease stage IIIb, who normally lives at home.  Patient was reportedly in her usual state of health yesterday when she suffered a fall.  Family reports that she did not hit her head, but did complain of some abdominal pain after her fall.  She is seen her primary care physician who had examined her and did not feel that she needed any underlying imaging.  Per family did give her some baclofen yesterday since they felt it might help with her abdominal pain.  When family tried to wake her up this morning, she was increasingly lethargic.  They noticed that she was having increased work of breathing.  She did not have any recent fever or cough.  History is somewhat limited due to mental status.  Patient is followed by a [facility] palliative care  ED Course: She was evaluated in the emergency room where she was noted to be hypoxic and started on nonrebreather oxygen.  CT angiogram of the head and neck did not show any LVO or new stroke.  She had a CT of her chest, abdomen pelvis that did not show any acute findings.  Creatinine near baseline at 1.5.  PCO2 on VBG noted to be in the 60s with a pH of 7.31.  Review of Systems: Unable to assess due to mental status   Past Medical History:  Diagnosis Date   Anemia    Anxiety    Arthritis    Blood transfusion without reported diagnosis    Carotid artery stenosis    Without infarction   Cataract    Chronic combined systolic (congestive) and diastolic (congestive) heart failure  (HCC)    COPD (chronic obstructive pulmonary disease) ([place])    Wears 02 at home 2 L   Coronary artery disease    Heart valve replaced by other means    Hypercholesterolemia    Pure   Hypertension    Unspecified   LBBB (left bundle branch block)    Macular degeneration (senile) of retina, unspecified    On home O2    Postsurgical aortocoronary bypass status    Stroke ([place])    Transient global amnesia    Unspecified hereditary and idiopathic peripheral neuropathy    Unspecified vitamin D deficiency     Past Surgical History:  Procedure Laterality Date   AORTIC VALVE REPLACEMENT     APPENDECTOMY     CORONARY ARTERY BYPASS GRAFT     ERCP N/A 09/13/2020   Procedure: ENDOSCOPIC RETROGRADE CHOLANGIOPANCREATOGRAPHY (ERCP);  Surgeon: Milus Banister, MD;  Location[facility] END[facility]COPY;  Service: Endoscopy;  Laterality: N/A;   HIP PINNING,CANNULATED Right 07/18/2018   Procedure: RIGHT CANNULATED HIP PINNING;  Surgeon: Altamese Manor, MD;  Location: [place];  Service: Orthopedics;  Laterality: Right;   REMOVAL OF STONES  09/13/2020   Procedure: REMOVAL OF STONES;  Surgeon: Milus Banister, MD;  Location: [facility] ENDOSCOPY;  Service: Endoscopy;;   SPHINCTEROTOMY  09/13/2020   Procedure: Joan Mayans;  Surgeon: Milus Banister, MD;  Location: [place];  Service: Endoscopy;;   TONSILLECTOMY  Social History:  reports that she quit smoking about 30 years ago. Her smoking use included cigarettes. She smoked an average of .5 packs per day. She has never used smokeless tobacco. She reports that she does not drink alcohol and does not use drugs.  Allergies  Allergen Reactions   Codeine Nausea And Vomiting   Penicillins Other (See Comments)    Unknown reaction - listed on MAR Has patient had a PCN reaction causing immediate rash, facial/tongue/throat swelling, SOB or lightheadedness with hypotension: Unknown Has patient had a PCN reaction causing severe rash involving mucus membranes or skin  necrosis: Unknown Has patient had a PCN reaction that required hospitalization: Unknown Has patient had a PCN reaction occurring within the last 10 years: Unknown If all of the above answers are "NO", then may proceed with Cephalosporin use.    Family History  Problem Relation Age of Onset   Heart attack Father    Heart disease Father    Stroke Father    Stroke Sister    Hypertension Mother    Stroke Mother    Neuropathy Brother    COPD Sister     Prior to Admission medications   Medication Sig Start Date End Date Taking? Authorizing Provider  acetaminophen (TYLENOL) 650 MG CR tablet Take 650 mg by mouth every 8 (eight) hours as needed for pain.    [provider]  Aflibercept (EYLEA) 2 MG/0.05ML SOLN by Intravitreal route. Every 9 weeks    [provider]  ALPRAZolam (XANAX) 0.25 MG tablet Take 0.125-0.25 mg by mouth at bedtime as needed for anxiety.     [provider]  atorvastatin (LIPITOR) 40 MG tablet TAKE ONE TABLET BY MOUTH ONCE DAILY. 04/20/22   Lewayne Bunting, MD  baclofen (LIORESAL) 10 MG tablet Take 10 mg by mouth as needed for muscle spasms.    [provider]  Cholecalciferol (VITAMIN D3 PO) Take by mouth once a week. On sundays    [provider]  docusate sodium (COLACE) 100 MG capsule Take 100 mg by mouth as needed.    [provider]  donepezil (ARICEPT) 10 MG tablet TAKE ONE TABLET BY MOUTH ONCE DAILY. Patient taking differently: Take 10 mg by mouth at bedtime. 04/15/20   Corum, Minerva Fester, MD  ferrous sulfate 325 (65 FE) MG EC tablet Take 325 mg by mouth daily.     [provider]  furosemide (LASIX) 40 MG tablet Take 1.5 tablets (60 mg total) by mouth daily. 03/18/22   Lewayne Bunting, MD  HYDROcodone-acetaminophen (NORCO) 7.5-325 MG tablet Take 1 tablet by mouth every 6 (six) hours as needed for moderate pain.    [provider]  hydrocortisone (ANUSOL-HC) 2.5 % rectal cream Place 1 application  rectally 3 (three) times daily as needed for hemorrhoids or anal itching.    [provider]  levalbuterol Pauline Aus) 0.63 MG/3ML nebulizer solution USE 1 VIAL VIA NEBULIZER EVERY 8 HOURS AS NEEDED. 02/22/22   Nyoka Cowden, MD  meclizine (ANTIVERT) 25 MG tablet Take 25 mg by mouth 3 (three) times daily as needed for dizziness.    [provider]  Melatonin 3 MG TABS Take 1 tablet by mouth at bedtime.    [provider]  methylPREDNISolone (MEDROL) 4 MG tablet Take 4 mg by mouth 2 (two) times daily as needed.    [provider]  multivitamin-lutein (OCUVITE-LUTEIN) CAPS capsule Take 1 capsule by mouth daily.    [provider]  ondansetron (  ZOFRAN) 4 MG tablet Take 4 mg by mouth every 8 (eight) hours as needed for nausea or vomiting.    [provider]  polyethylene glycol (MIRALAX / GLYCOLAX) 17 g packet Take 17 g by mouth daily as needed for moderate constipation.    [provider]  potassium chloride (KLOR-CON) 10 MEQ tablet Take 2 tablets (20 mEq total) by mouth daily. 03/29/22   Lelon Perla, MD  pregabalin (LYRICA) 75 MG capsule TAKE 1 CAPSULE BY MOUTH EVERY OTHER DAY. 11/11/20   Mordecai Rasmussen, MD  spironolactone (ALDACTONE) 25 MG tablet Take 0.5 tablets (12.5 mg total) by mouth daily. 05/27/22   Lelon Perla, MD  traZODone (DESYREL) 50 MG tablet Take 0.5-1 tablets (25-50 mg total) by mouth at bedtime as needed for sleep. 11/19/19   Corum, Rex Kras, MD  umeclidinium-vilanterol (ANORO ELLIPTA) 62.5-25 MCG/ACT AEPB USE 1 INHALATION ONCE DAILY. 02/22/22   Tanda Rockers, MD  warfarin (COUMADIN) 5 MG tablet TAKE 1/2 TO 1 TABLET ONCE DAILY AS DIRECTED BY COUMADIN CLINIC. 07/13/22   Lelon Perla, MD  zinc sulfate 50 MG CAPS capsule Take 50 mg by mouth 2 (two) times daily.     [provider]    Physical Exam: Vitals:   07/14/22 1556 07/14/22 1622 07/14/22 1700 07/14/22 1728  BP:  (!) 155/46 (!) 156/48 (!) 156/48   Pulse:  96 94 84  Resp:  16 17 16   Temp: 97.6 F (36.4 C) (!) 97.5 F (36.4 C)    TempSrc: Temporal Rectal    SpO2:  100% 100% 100%  Weight:  55.6 kg    Height:  5\' 2"  (1.575 m)      Constitutional: somnolent, on NRB, increased work of breathing Eyes: PERRL, lids and conjunctivae normal ENMT: Mucous membranes are moist. Posterior pharynx clear of any exudate or lesions.Normal dentition.  Neck: normal, supple, no masses, no thyromegaly Respiratory: mostly clear with some coarse breath sounds. No wheezing, increased work of breathing  Cardiovascular: Regular rate and rhythm, no murmurs / rubs / gallops. No extremity edema. 2+ pedal pulses. No carotid bruits.  Abdomen: no tenderness, no masses palpated. No hepatosplenomegaly. Bowel sounds positive.  Musculoskeletal: no clubbing / cyanosis. No joint deformity upper and lower extremities. Good ROM, no contractures. Normal muscle tone.  Skin: no rashes, lesions, ulcers. No induration Neurologic: somnolent, unable to assess Psychiatric: unable to assess   Labs on Admission: I have personally reviewed following labs and imaging studies  CBC: Recent Labs  Lab 07/14/22 1040 07/14/22 1110  WBC 10.6*  --   NEUTROABS 8.0*  --   HGB 9.8* 9.9*  HCT 31.8* 29.0*  MCV 98.5  --   PLT 171  --    Basic Metabolic Panel: Recent Labs  Lab 07/14/22 1040 07/14/22 1110  NA 143 142  K 4.4 4.1  CL 108 110  CO2 27  --   GLUCOSE 112* 115*  BUN 30* 32*  CREATININE 1.54* 1.70*  CALCIUM 9.2  --    GFR: Estimated Creatinine Clearance: 16.4 mL/min (A) (by C-G formula based on SCr of 1.7 mg/dL (H)). Liver Function Tests: Recent Labs  Lab 07/14/22 1040  AST 28  ALT 21  ALKPHOS 81  BILITOT 1.0  PROT 7.5  ALBUMIN 4.0   No results for input(s): "LIPASE", "AMYLASE" in the last 168 hours. No results for input(s): "AMMONIA" in the last 168 hours. Coagulation Profile: Recent Labs  Lab 07/13/22 0000 07/14/22 1040  INR 3.0  3.4*   Cardiac  Enzymes: No results for input(s): "CKTOTAL", "CKMB", "CKMBINDEX", "TROPONINI" in the last 168 hours. BNP (last 3 results) No results for input(s): "PROBNP" in the last 8760 hours. HbA1C: No results for input(s): "HGBA1C" in the last 72 hours. CBG: Recent Labs  Lab 07/14/22 1028 07/14/22 1619  GLUCAP 100* 149*   Lipid Profile: No results for input(s): "CHOL", "HDL", "LDLCALC", "TRIG", "CHOLHDL", "LDLDIRECT" in the last 72 hours. Thyroid Function Tests: Recent Labs    07/14/22 1040  TSH 2.535   Anemia Panel: No results for input(s): "VITAMINB12", "FOLATE", "FERRITIN", "TIBC", "IRON", "RETICCTPCT" in the last 72 hours. Urine analysis:    Component Value Date/Time   COLORURINE YELLOW 07/14/2022 1040   APPEARANCEUR HAZY (A) 07/14/2022 1040   LABSPEC 1.020 07/14/2022 1040   PHURINE 5.0 07/14/2022 1040   GLUCOSEU NEGATIVE 07/14/2022 1040   GLUCOSEU NEGATIVE 04/10/2008 1246   HGBUR NEGATIVE 07/14/2022 1040   BILIRUBINUR NEGATIVE 07/14/2022 1040   KETONESUR NEGATIVE 07/14/2022 1040   PROTEINUR 100 (A) 07/14/2022 1040   UROBILINOGEN 0.2 10/20/2019 1404   NITRITE NEGATIVE 07/14/2022 1040   LEUKOCYTESUR NEGATIVE 07/14/2022 1040    Radiological Exams on Admission: CT ANGIO HEAD NECK W WO CM  Result Date: 07/14/2022 CLINICAL DATA:  Neuro deficit, acute, stroke suspected EXAM: CT ANGIOGRAPHY HEAD AND NECK TECHNIQUE: Multidetector CT imaging of the head and neck was performed using the standard protocol during bolus administration of intravenous contrast. Multiplanar CT image reconstructions and MIPs were obtained to evaluate the vascular anatomy. Carotid stenosis measurements (when applicable) are obtained utilizing NASCET criteria, using the distal internal carotid diameter as the denominator. RADIATION DOSE REDUCTION: This exam was performed according to the departmental dose-optimization program which includes automated exposure control, adjustment of the mA and/or kV according to  patient size and/or use of iterative reconstruction technique. CONTRAST:  26mL OMNIPAQUE IOHEXOL 350 MG/ML SOLN COMPARISON:  None Available. FINDINGS: Motion artifact is present. CTA NECK Aortic arch: Calcified plaque along the glued arch. Great vessel origins are patent. No high-grade proximal subclavian stenosis. Right carotid system: Patent. Mild multifocal primarily calcified plaque. There is motion artifact through the bifurcation precluding evaluation at this level. Left carotid system: Patent. Mild multifocal calcified plaque. No hemodynamically significant stenosis. Vertebral arteries: Patent. Left vertebral is dominant with dolichoectasia. No significant stenosis. Skeleton: Degenerative changes of the included spine. Other neck: Unremarkable. Upper chest: Increased small left pleural effusion. Review of the MIP images confirms the above findings CTA HEAD Anterior circulation: Intracranial internal carotid arteries are patent. Calcified plaque is present causing up to moderate stenosis in the right proximal supraclinoid region. Mild stenosis is present on the left. Anterior and middle cerebral arteries are patent. Posterior circulation: Intracranial vertebral arteries are patent. Right vertebral terminates as a PICA. Basilar artery is patent. Major cerebellar artery origins are patent. Posterior cerebral arteries are patent. Venous sinuses: Patent as allowed by contrast bolus timing. Review of the MIP images confirms the above findings IMPRESSION: Motion artifact. No large vessel occlusion, hemodynamically significant stenosis, or evidence of dissection. The right ICA origin cannot be adequately evaluated due to motion degradation. Increased small left pleural effusion. Electronically Signed   By: Macy Mis M.D.   On: 07/14/2022 14:26   CT CHEST ABDOMEN PELVIS WO CONTRAST  Result Date: 07/14/2022 CLINICAL DATA:  Altered mental status.  Status post fall yesterday. EXAM: CT CHEST, ABDOMEN AND PELVIS  WITHOUT CONTRAST TECHNIQUE: Multidetector CT imaging of the chest, abdomen and pelvis was performed following the  standard protocol without IV contrast. Exam is limited due to persistent patient motion artifact. RADIATION DOSE REDUCTION: This exam was performed according to the departmental dose-optimization program which includes automated exposure control, adjustment of the mA and/or kV according to patient size and/or use of iterative reconstruction technique. COMPARISON:  September 08, 2020. FINDINGS: CT CHEST FINDINGS Cardiovascular: Status post aortic valve repair and coronary artery bypass graft. Atherosclerosis of thoracic aorta is noted without aneurysm formation. Mild cardiomegaly is noted. No pericardial effusion is noted. Mediastinum/Nodes: No enlarged mediastinal, hilar, or axillary lymph nodes. Thyroid gland, trachea, and esophagus demonstrate no significant findings. Lungs/Pleura: No pneumothorax or pleural effusion is noted. Minimal bilateral posterior basilar subsegmental atelectasis is noted. Mild emphysematous disease is noted. Mild biapical scarring is noted. Musculoskeletal: No chest wall mass or suspicious bone lesions identified. CT ABDOMEN PELVIS FINDINGS Hepatobiliary: No focal liver abnormality is seen. No gallstones, gallbladder wall thickening, or biliary dilatation. Pancreas: Unremarkable. No pancreatic ductal dilatation or surrounding inflammatory changes. Spleen: Normal in size without focal abnormality. Adrenals/Urinary Tract: Adrenal glands are unremarkable. Kidneys are normal, without renal calculi, focal lesion, or hydronephrosis. Bladder is unremarkable. Stomach/Bowel: The stomach is unremarkable. There is no evidence of bowel obstruction or inflammation. Status post appendectomy. Sigmoid diverticulosis is noted without inflammation. Vascular/Lymphatic: 3.3 cm infrarenal abdominal aortic aneurysm is noted. No adenopathy is noted. Reproductive: Uterus and bilateral adnexa are  unremarkable. Other: No abdominal wall hernia or abnormality. No abdominopelvic ascites. Musculoskeletal: No acute or significant osseous findings. IMPRESSION: Limited exam due to persistent patient motion artifact. Mild biapical scarring. Minimal bilateral posterior basilar subsegmental atelectasis. 3.3 cm infrarenal abdominal aortic aneurysm. Recommend follow-up ultrasound every 3 years. This recommendation follows ACR consensus guidelines: White Paper of the ACR Incidental Findings Committee II on Vascular Findings. J Am Coll Radiol 2013; 10:789-794. Sigmoid diverticulosis without inflammation. Aortic Atherosclerosis (ICD10-I70.0) and Emphysema (ICD10-J43.9). Electronically Signed   By: Lupita Raider M.D.   On: 07/14/2022 11:33   CT HEAD WO CONTRAST  Result Date: 07/14/2022 CLINICAL DATA:  Provided history: Mental status change, unknown cause. EXAM: CT HEAD WITHOUT CONTRAST TECHNIQUE: Contiguous axial images were obtained from the base of the skull through the vertex without intravenous contrast. RADIATION DOSE REDUCTION: This exam was performed according to the departmental dose-optimization program which includes automated exposure control, adjustment of the mA and/or kV according to patient size and/or use of iterative reconstruction technique. COMPARISON:  Prior head CT examinations 10/25/2019 and earlier. FINDINGS: Mild-to-moderately motion degraded exam, limiting evaluation. Brain: Moderate generalized cerebral atrophy. Mild cerebellar atrophy. Mild-to-moderate patchy and ill-defined hypoattenuation within the cerebral white matter, nonspecific but compatible with chronic small vessel ischemic disease. There is no acute intracranial hemorrhage. No demarcated cortical infarct. No extra-axial fluid collection. No evidence of an intracranial mass. No midline shift. Vascular: No hyperdense vessel. Atherosclerotic calcifications. Skull: No fracture or aggressive osseous lesion. Sinuses/Orbits: No mass or  acute finding within the imaged orbits. No significant paranasal sinus disease at the imaged levels. IMPRESSION: Motion degraded examination. No evidence of acute intracranial abnormality. Mild-to-moderate chronic small vessel image changes within the cerebral white matter. Moderate generalized cerebral atrophy. Mild cerebellar atrophy. Electronically Signed   By: Jackey Loge D.O.   On: 07/14/2022 11:22   DG Chest Port 1 View  Result Date: 07/14/2022 CLINICAL DATA:  Questionable sepsis, altered mental status, pinpoint pupils EXAM: PORTABLE CHEST 1 VIEW COMPARISON:  09/15/2020 FINDINGS: Cardiomegaly status post median sternotomy and CABG. Diffuse bilateral interstitial opacity and probable small pleural effusions. No  focal airspace opacity. The visualized skeletal structures are unremarkable. IMPRESSION: Cardiomegaly with diffuse bilateral interstitial opacity and probable small pleural effusions, findings most consistent with pulmonary edema. No focal airspace opacity. Electronically Signed   By: Delanna Ahmadi M.D.   On: 07/14/2022 10:59    EKG: Independently reviewed. LBBB, sinus tach, no significant change from prior tracings  Assessment/Plan Principal Problem:   Acute respiratory failure with hypoxia (HCC) Active Problems:   Hyperlipidemia   Essential hypertension   CAD, ARTERY BYPASS GRAFT   LBBB (left bundle branch block)   S/P AVR (aortic valve replacement)   Dementia (HCC)   CKD (chronic kidney disease) stage 3, GFR 30-59 ml/min (HCC)   COPD GOLD B symptoms/risk    Acute encephalopathy   DNR (do not resuscitate)     Acute encephalopathy,  -suspect multifactorial related to metabolic factors including elevated PCO2 as well as polypharmacy since she is on baclofen and has listed hydrocodone, Lyrica -CT imaging of her brain did not show any acute changes -She does not have any obvious source of infection including UTI or pneumonia -Continue to follow clinically -Cannot MRI brain  due to mechanical heart valve  Acute on chronic respiratory failure with hypoxia and hypercapnia -She is chronically on 3 to 4 L of oxygen -Currently on 100% nonrebreather -With elevated PCO2, will give a trial of BiPAP  COPD -She does have increased work of breathing -Continue on steroids and bronchodilators  Elevated lactate -She is not hypotensive, this may be related to hypoxia -She has been started on broad-spectrum IV antibiotics to cover for possible sepsis, would continue for now  Chronic kidney disease stage IIIb -Baseline creatinine approximately 1.3-1.5 -Creatinine currently at baseline -Continue to monitor  Acute on chronic diastolic congestive heart failure -Last echo from 08/2020 showed grade 1 diastolic dysfunction and EF 55 to 60% -Chest x-ray indicated some evidence of volume overload -We will give 1 dose of intravenous Lasix -Check BNP  Status post aortic valve replacement -Continue anticoagulation with Coumadin -Pharmacy to follow  Baseline cognitive impairment -Can resume Aricept if mental status improves  Goals of care -Family confirms DNR status -She is followed by palliative care as an outpatient setting -If she fails to make meaningful improvements, will consider transition to comfort measures -Requested palliative care follow  DVT prophylaxis: coumadin  Code Status: DNR  Family Communication: multiple family members at bedside, Kerby at bedside   Disposition Plan: discharge home once improved  Consults called: palliative care  Admission status: inpatient, stepdown   Kathie Dike MD [facility]   If 7PM-7AM, please contact night-coverage [email or web address]   07/14/2022, 6:05 PM

## 2022-07-14 NOTE — ED Notes (Signed)
Xray at BS 

## 2022-07-14 NOTE — ED Notes (Signed)
Pt to CT for CT head. Chest abd pelvis w/o added per Dr. Durwin Nora. No changes. Remains on monitor and O2

## 2022-07-14 NOTE — Telephone Encounter (Signed)
Left message for kia at dr hall's office that dr Jens Som agreed with those changes.

## 2022-07-14 NOTE — ED Notes (Signed)
Back from CT, no changes, awake, tachypneic, increased wob, follows some commands, resps shallow shallow, LS diminished, VSS. Family x2 at [facility].

## 2022-07-14 NOTE — Progress Notes (Signed)
ANTICOAGULATION CONSULT NOTE - Initial Consult  Pharmacy Consult for Coumadin Indication:  AVR  Allergies  Allergen Reactions   Codeine Nausea And Vomiting   Penicillins Other (See Comments)    Unknown reaction - listed on MAR Has patient had a PCN reaction causing immediate rash, facial/tongue/throat swelling, SOB or lightheadedness with hypotension: Unknown Has patient had a PCN reaction causing severe rash involving mucus membranes or skin necrosis: Unknown Has patient had a PCN reaction that required hospitalization: Unknown Has patient had a PCN reaction occurring within the last 10 years: Unknown If all of the above answers are "NO", then may proceed with Cephalosporin use.    Patient Measurements: Height: 5\' 2"  (157.5 cm) Weight: 55.6 kg (122 lb 9.2 oz) IBW/kg (Calculated) : 50.1 Heparin Dosing Weight:   Vital Signs: Temp: 97.5 F (36.4 C) (08/30 1622) Temp Source: Rectal (08/30 1622) BP: 156/48 (08/30 1728) Pulse Rate: 84 (08/30 1728)  Labs: Recent Labs    07/13/22 0000 07/14/22 1040 07/14/22 1110 07/14/22 1232  HGB  --  9.8* 9.9*  --   HCT  --  31.8* 29.0*  --   PLT  --  171  --   --   APTT  --  74*  --   --   LABPROT  --  34.3*  --   --   INR 3.0 3.4*  --   --   CREATININE  --  1.54* 1.70*  --   TROPONINIHS  --  35*  --  62*    Estimated Creatinine Clearance: 16.4 mL/min (A) (by C-G formula based on SCr of 1.7 mg/dL (H)).   Medical History: Past Medical History:  Diagnosis Date   Anemia    Anxiety    Arthritis    Blood transfusion without reported diagnosis    Carotid artery stenosis    Without infarction   Cataract    Chronic combined systolic (congestive) and diastolic (congestive) heart failure (HCC)    COPD (chronic obstructive pulmonary disease) (HCC)    Wears 02 at home 2 L   Coronary artery disease    Heart valve replaced by other means    Hypercholesterolemia    Pure   Hypertension    Unspecified   LBBB (left bundle branch block)     Macular degeneration (senile) of retina, unspecified    On home O2    Postsurgical aortocoronary bypass status    Stroke (HCC)    Transient global amnesia    Unspecified hereditary and idiopathic peripheral neuropathy    Unspecified vitamin D deficiency     Medications:  Medications Prior to Admission  Medication Sig Dispense Refill Last Dose   acetaminophen (TYLENOL) 650 MG CR tablet Take 650 mg by mouth every 8 (eight) hours as needed for pain.      Aflibercept (EYLEA) 2 MG/0.05ML SOLN by Intravitreal route. Every 9 weeks      ALPRAZolam (XANAX) 0.25 MG tablet Take 0.125-0.25 mg by mouth at bedtime as needed for anxiety.       atorvastatin (LIPITOR) 40 MG tablet TAKE ONE TABLET BY MOUTH ONCE DAILY. 90 tablet 0    baclofen (LIORESAL) 10 MG tablet Take 10 mg by mouth as needed for muscle spasms.      Cholecalciferol (VITAMIN D3 PO) Take by mouth once a week. On sundays      docusate sodium (COLACE) 100 MG capsule Take 100 mg by mouth as needed.      donepezil (ARICEPT) 10 MG tablet TAKE ONE  TABLET BY MOUTH ONCE DAILY. (Patient taking differently: Take 10 mg by mouth at bedtime.) 30 tablet 0    ferrous sulfate 325 (65 FE) MG EC tablet Take 325 mg by mouth daily.       furosemide (LASIX) 40 MG tablet Take 1.5 tablets (60 mg total) by mouth daily. 135 tablet 3    HYDROcodone-acetaminophen (NORCO) 7.5-325 MG tablet Take 1 tablet by mouth every 6 (six) hours as needed for moderate pain.      hydrocortisone (ANUSOL-HC) 2.5 % rectal cream Place 1 application rectally 3 (three) times daily as needed for hemorrhoids or anal itching.      levalbuterol (XOPENEX) 0.63 MG/3ML nebulizer solution USE 1 VIAL VIA NEBULIZER EVERY 8 HOURS AS NEEDED. 150 mL 3    meclizine (ANTIVERT) 25 MG tablet Take 25 mg by mouth 3 (three) times daily as needed for dizziness.      Melatonin 3 MG TABS Take 1 tablet by mouth at bedtime.      methylPREDNISolone (MEDROL) 4 MG tablet Take 4 mg by mouth 2 (two) times daily as  needed.      multivitamin-lutein (OCUVITE-LUTEIN) CAPS capsule Take 1 capsule by mouth daily.      ondansetron (ZOFRAN) 4 MG tablet Take 4 mg by mouth every 8 (eight) hours as needed for nausea or vomiting.      polyethylene glycol (MIRALAX / GLYCOLAX) 17 g packet Take 17 g by mouth daily as needed for moderate constipation.      potassium chloride (KLOR-CON) 10 MEQ tablet Take 2 tablets (20 mEq total) by mouth daily. 180 tablet 3    pregabalin (LYRICA) 75 MG capsule TAKE 1 CAPSULE BY MOUTH EVERY OTHER DAY. 15 capsule 0    spironolactone (ALDACTONE) 25 MG tablet Take 0.5 tablets (12.5 mg total) by mouth daily. 45 tablet 3    traZODone (DESYREL) 50 MG tablet Take 0.5-1 tablets (25-50 mg total) by mouth at bedtime as needed for sleep. 30 tablet 3    umeclidinium-vilanterol (ANORO ELLIPTA) 62.5-25 MCG/ACT AEPB USE 1 INHALATION ONCE DAILY. 60 each 3    warfarin (COUMADIN) 5 MG tablet TAKE 1/2 TO 1 TABLET ONCE DAILY AS DIRECTED BY COUMADIN CLINIC. 80 tablet 1    zinc sulfate 50 MG CAPS capsule Take 50 mg by mouth 2 (two) times daily.       Scheduled:   arformoterol  15 mcg Nebulization BID   budesonide (PULMICORT) nebulizer solution  0.25 mg Nebulization BID   furosemide  40 mg Intravenous Once   ipratropium-albuterol  3 mL Nebulization Q6H   methylPREDNISolone (SOLU-MEDROL) injection  40 mg Intravenous Q12H   Infusions:   [START ON 07/15/2022] ceFEPime (MAXIPIME) IV     [START ON 07/16/2022] vancomycin      Assessment: Pt with a hx of AVR who was admitted for AMS. Her INR is 3.4 today with goal of 2.2-3 per office note. Not sure if dose has been taken today since medrec couldn't be done due to mental status. We will hold dose tonight since INR is supratherapeutic.  Hgb 9.9 Plt 171k  PTA dose: 2.5mg  qday except 5mg  MWF Goal of Therapy:  INR 2.2-3 Monitor platelets by anticoagulation protocol: Yes   Plan:  No coumadin tonight Daily INR  , PharmD, [place], AAHIVP, CPP Infectious  Disease Pharmacist 07/14/2022 6:05 PM

## 2022-07-14 NOTE — ED Notes (Signed)
EDP at [facility]. Family present. CT notified, will call ED back when ready for pt

## 2022-07-14 NOTE — Progress Notes (Signed)
Patient transported to CT on BIPAP without complication and back to ICU 5

## 2022-07-14 NOTE — ED Provider Notes (Signed)
[facility] EMERGENCY DEPARTMENT Provider Note   CSN: [id number] Arrival date & time: 07/14/22  1026     History  Chief Complaint  Patient presents with   Altered Mental Status    Josephine CablesRuth E [name] is a 88 y.o. female.   Altered Mental Status Patient presents for altered mental status.  This was noticed by caregiver at 8:30 AM.  She was in her normal state of health yesterday.  She did have a fall yesterday that was unwitnessed.  It was described as mechanical when she was trying to step on a scale at home.  She struck her abdomen and elbow.  She denied striking her head.  After fall, she complained of upper abdominal pain.  She went to her primary care doctor who performed an exam did not feel that she needed any imaging.  She is on warfarin.  Upon waking this morning, patient was minimally verbal.  She was only saying yes.  When EMS arrived, she said "what is going on".  EMS noted hypoxia on scene.  She was placed on nonrebreather.  At baseline, she is on 4 L of supplemental oxygen.  CBG was normal prior to arrival.  Patient's last known well was 11:00 last night.     Home Medications Prior to Admission medications   Medication Sig Start Date End Date Taking? Authorizing Provider  LORazepam (ATIVAN) 2 MG/ML concentrated solution Take 0.5 mLs (1 mg total) by mouth every 6 (six) hours as needed for anxiety. 07/16/22  Yes Erick BlinksMemon, Jehanzeb, MD  acetaminophen (TYLENOL) 650 MG CR tablet Take 650 mg by mouth every 8 (eight) hours as needed for pain.    [provider]  Aflibercept (EYLEA) 2 MG/0.05ML SOLN by Intravitreal route. Every 9 weeks    [provider]  hydrocortisone (ANUSOL-HC) 2.5 % rectal cream Place 1 application rectally 3 (three) times daily as needed for hemorrhoids or anal itching.    [provider]  levalbuterol Pauline Aus(XOPENEX) 0.63 MG/3ML nebulizer solution USE 1 VIAL VIA NEBULIZER EVERY 8 HOURS AS NEEDED. 02/22/22   Nyoka CowdenWert, Michael B, MD  Morphine Sulfate  (MORPHINE CONCENTRATE) 10 MG/0.5ML SOLN concentrated solution Place 0.25 mLs (5 mg total) under the tongue every hour as needed for shortness of breath or moderate pain. 07/16/22   Erick BlinksMemon, Jehanzeb, MD  polyethylene glycol (MIRALAX / GLYCOLAX) 17 g packet Take 17 g by mouth daily as needed for moderate constipation.    [provider]  umeclidinium-vilanterol (ANORO ELLIPTA) 62.5-25 MCG/ACT AEPB USE 1 INHALATION ONCE DAILY. 02/22/22   Nyoka CowdenWert, Michael B, MD      Allergies    Codeine and Penicillins    Review of Systems   Review of Systems  Unable to perform ROS: Mental status change    Physical Exam Updated Vital Signs BP (!) 159/78   Pulse 100   Temp (!) 97.3 F (36.3 C) (Axillary)   Resp (!) 22   Ht 5\' 2"  (1.575 m)   Wt 55.2 kg   SpO2 97%   BMI 22.26 kg/m  Physical Exam Vitals and nursing note reviewed.  Constitutional:      Appearance: She is well-developed. She is ill-appearing. She is not toxic-appearing or diaphoretic.  HENT:     Head: Normocephalic and atraumatic.     Right Ear: External ear normal.     Left Ear: External ear normal.     Nose: Nose normal.     Mouth/Throat:     Mouth: Mucous membranes are moist.  Pharynx: Oropharynx is clear.  Eyes:     Conjunctiva/sclera: Conjunctivae normal.     Comments: Pinpoint pupils  Cardiovascular:     Rate and Rhythm: Regular rhythm. Tachycardia present.     Heart sounds: No murmur heard. Pulmonary:     Effort: Respiratory distress present.     Breath sounds: Normal breath sounds. No wheezing or rales.  Chest:     Chest wall: No tenderness.  Abdominal:     General: There is no distension.     Palpations: Abdomen is soft.     Tenderness: There is no abdominal tenderness.  Musculoskeletal:        General: No swelling.     Cervical back: Neck supple. No rigidity.     Left lower leg: No edema.  Skin:    General: Skin is warm and dry.     Coloration: Skin is not jaundiced or pale.  Neurological:     Mental  Status: She is alert.     GCS: GCS eye subscore is 4. GCS verbal subscore is 1. GCS motor subscore is 5.     ED Results / Procedures / Treatments   Labs (all labs ordered are listed, but only abnormal results are displayed) Labs Reviewed  PROTIME-INR - Abnormal; Notable for the following components:      Result Value   Prothrombin Time 34.3 (*)    INR 3.4 (*)    All other components within normal limits  APTT - Abnormal; Notable for the following components:   aPTT 74 (*)    All other components within normal limits  CBC - Abnormal; Notable for the following components:   WBC 10.6 (*)    RBC 3.23 (*)    Hemoglobin 9.8 (*)    HCT 31.8 (*)    All other components within normal limits  DIFFERENTIAL - Abnormal; Notable for the following components:   Neutro Abs 8.0 (*)    Eosinophils Absolute 0.6 (*)    All other components within normal limits  COMPREHENSIVE METABOLIC PANEL - Abnormal; Notable for the following components:   Glucose, Bld 112 (*)    BUN 30 (*)    Creatinine, Ser 1.54 (*)    GFR, Estimated 31 (*)    All other components within normal limits  RAPID URINE DRUG SCREEN, HOSP PERFORMED - Abnormal; Notable for the following components:   Benzodiazepines POSITIVE (*)    All other components within normal limits  URINALYSIS, ROUTINE W REFLEX MICROSCOPIC - Abnormal; Notable for the following components:   APPearance HAZY (*)    Protein, ur 100 (*)    Bacteria, UA RARE (*)    All other components within normal limits  LACTIC ACID, PLASMA - Abnormal; Notable for the following components:   Lactic Acid, Venous 2.9 (*)    All other components within normal limits  D-DIMER, QUANTITATIVE - Abnormal; Notable for the following components:   D-Dimer, Quant 2.87 (*)    All other components within normal limits  BLOOD GAS, VENOUS - Abnormal; Notable for the following components:   pCO2, Ven 62 (*)    pO2, Ven <31 (*)    Bicarbonate 31.7 (*)    Acid-Base Excess 3.6 (*)     All other components within normal limits  LACTIC ACID, PLASMA - Abnormal; Notable for the following components:   Lactic Acid, Venous 2.9 (*)    All other components within normal limits  GLUCOSE, CAPILLARY - Abnormal; Notable for the following components:   Glucose-Capillary  149 (*)    All other components within normal limits  BRAIN NATRIURETIC PEPTIDE - Abnormal; Notable for the following components:   B Natriuretic Peptide 825.0 (*)    All other components within normal limits  COMPREHENSIVE METABOLIC PANEL - Abnormal; Notable for the following components:   Glucose, Bld 172 (*)    BUN 34 (*)    Creatinine, Ser 1.73 (*)    Calcium 8.7 (*)    Total Protein 6.1 (*)    Albumin 3.2 (*)    GFR, Estimated 27 (*)    All other components within normal limits  CBC - Abnormal; Notable for the following components:   RBC 2.94 (*)    Hemoglobin 9.0 (*)    HCT 28.6 (*)    All other components within normal limits  BRAIN NATRIURETIC PEPTIDE - Abnormal; Notable for the following components:   B Natriuretic Peptide 3,714.0 (*)    All other components within normal limits  BLOOD GAS, ARTERIAL - Abnormal; Notable for the following components:   Bicarbonate 28.5 (*)    Acid-Base Excess 3.2 (*)    Allens test (pass/fail) NOT INDICATED (*)    All other components within normal limits  PROTIME-INR - Abnormal; Notable for the following components:   Prothrombin Time 41.4 (*)    INR 4.4 (*)    All other components within normal limits  CBG MONITORING, ED - Abnormal; Notable for the following components:   Glucose-Capillary 100 (*)    All other components within normal limits  I-STAT CHEM 8, ED - Abnormal; Notable for the following components:   BUN 32 (*)    Creatinine, Ser 1.70 (*)    Glucose, Bld 115 (*)    Calcium, Ion 1.05 (*)    Hemoglobin 9.9 (*)    HCT 29.0 (*)    All other components within normal limits  TROPONIN I (HIGH SENSITIVITY) - Abnormal; Notable for the following  components:   Troponin I (High Sensitivity) 35 (*)    All other components within normal limits  TROPONIN I (HIGH SENSITIVITY) - Abnormal; Notable for the following components:   Troponin I (High Sensitivity) 62 (*)    All other components within normal limits  RESP PANEL BY RT-PCR (FLU A&B, COVID) ARPGX2  CULTURE, BLOOD (ROUTINE X 2)  CULTURE, BLOOD (ROUTINE X 2)  MRSA NEXT GEN BY PCR, NASAL  ETHANOL  LACTIC ACID, PLASMA  TSH  LACTIC ACID, PLASMA  AMMONIA    EKG EKG Interpretation  Date/Time:  Wednesday July 14 2022 10:30:58 EDT Ventricular Rate:  101 PR Interval:  166 QRS Duration: 171 QT Interval:  395 QTC Calculation: 512 R Axis:   101 Text Interpretation: Sinus tachycardia Left bundle branch block Prolonged QT interval Confirmed by Gloris Manchester [phone]) on 07/14/2022 10:45:18 AM  Radiology ECHOCARDIOGRAM COMPLETE  Result Date: 07/15/2022    ECHOCARDIOGRAM REPORT   Patient Name:   SANGEETA YOUSE Date of Exam: 07/15/2022 Medical Rec #:  [id number]       Height:       62.0 in Accession #:    [id number]      Weight:       121.7 lb Date of Birth:  08-04-1926      BSA:          1.548 m Patient Age:    88 years        BP:           137/68 mmHg Patient Gender: F  HR:           74 bpm. Exam Location:  Inpatient Procedure: 2D Echo Indications:    congestive heart failure  History:        Patient has prior history of Echocardiogram examinations. CAD,                 COPD and chronic kidney disease, Aortic Valve Disease and Mitral                 Valve Disease, Arrythmias:LBBB; Risk Factors:Hypertension and                 Dyslipidemia.                 Aortic Valve: St. Jude valve is present in the aortic position.  Sonographer:    Delcie Roch RDCS Referring Phys: [phone] [facility]  Sonographer Comments: Image acquisition challenging due to respiratory motion. IMPRESSIONS  1. Left ventricular ejection fraction, by estimation, is 35 to 40%. The left ventricle has moderately  decreased function. The left ventricle demonstrates global hypokinesis. The left ventricular internal cavity size was mildly dilated. There is mild left ventricular hypertrophy. Left ventricular diastolic parameters are indeterminate.  2. Right ventricular systolic function is mildly reduced. The right ventricular size is normal. There is moderately elevated pulmonary artery systolic pressure. The estimated right ventricular systolic pressure is 48.4 mmHg.  3. Left atrial size was mildly dilated.  4. The mitral valve is degenerative. Moderate to severe mitral valve regurgitation. Mild mitral stenosis. The mean mitral valve gradient is 4.0 mmHg. Moderate mitral annular calcification.  5. Tricuspid valve regurgitation is mild to moderate.  6. The aortic valve has been repaired/replaced. Aortic valve regurgitation is moderate. There is a St. Jude valve present in the aortic position. Aortic valve mean gradient measures 17.0 mmHg.  7. The inferior vena cava is normal in size with <50% respiratory variability, suggesting right atrial pressure of 8 mmHg. Comparison(s): Prior images reviewed side by side. LVEF has decreased in comparison. Mitral valve regurgitation has also worsened. St. Jude prothesis in aortic position with moderate paravalvular regurgitation. FINDINGS  Left Ventricle: Left ventricular ejection fraction, by estimation, is 35 to 40%. The left ventricle has moderately decreased function. The left ventricle demonstrates global hypokinesis. The left ventricular internal cavity size was mildly dilated. There is mild left ventricular hypertrophy. Abnormal (paradoxical) septal motion, consistent with left bundle branch block. Left ventricular diastolic function could not be evaluated due to mitral annular calcification (moderate or greater). Left ventricular diastolic parameters are indeterminate. Right Ventricle: The right ventricular size is normal. No increase in right ventricular wall thickness. Right  ventricular systolic function is mildly reduced. There is moderately elevated pulmonary artery systolic pressure. The tricuspid regurgitant velocity is 3.18 m/s, and with an assumed right atrial pressure of 8 mmHg, the estimated right ventricular systolic pressure is 48.4 mmHg. Left Atrium: Left atrial size was mildly dilated. Right Atrium: Right atrial size was normal in size. Pericardium: There is no evidence of pericardial effusion. Mitral Valve: The mitral valve is degenerative in appearance. There is moderate thickening of the mitral valve leaflet(s). There is mild calcification of the mitral valve leaflet(s). Moderate mitral annular calcification. Moderate to severe mitral valve regurgitation, with posteriorly-directed jet. Mild mitral valve stenosis. MV peak gradient, 10.1 mmHg. The mean mitral valve gradient is 4.0 mmHg. Tricuspid Valve: The tricuspid valve is grossly normal. Tricuspid valve regurgitation is mild to moderate. Aortic Valve: The aortic valve has been repaired/replaced.  Aortic valve regurgitation is moderate. Aortic regurgitation PHT measures 257 msec. Aortic valve mean gradient measures 17.0 mmHg. Aortic valve peak gradient measures 30.0 mmHg. There is a St. Jude valve present in the aortic position. Pulmonic Valve: The pulmonic valve was grossly normal. Pulmonic valve regurgitation is trivial. Aorta: The aortic root is normal in size and structure. Venous: The inferior vena cava is normal in size with less than 50% respiratory variability, suggesting right atrial pressure of 8 mmHg. IAS/Shunts: No atrial level shunt detected by color flow Doppler.  LEFT VENTRICLE PLAX 2D LVIDd:         5.70 cm      Diastology LVIDs:         4.70 cm      LV e' medial:    5.44 cm/s LV PW:         1.10 cm      LV E/e' medial:  26.7 LV IVS:        0.90 cm      LV e' lateral:   9.03 cm/s                             LV E/e' lateral: 16.1  LV Volumes (MOD) LV vol d, MOD A2C: 149.0 ml LV vol s, MOD A2C: 101.0 ml LV  SV MOD A2C:     48.0 ml RIGHT VENTRICLE            IVC RV S prime:     9.57 cm/s  IVC diam: 2.10 cm TAPSE (M-mode): 1.4 cm LEFT ATRIUM           Index        RIGHT ATRIUM           Index LA diam:      3.70 cm 2.39 cm/m   RA Area:     13.30 cm LA Vol (A4C): 56.4 ml 36.44 ml/m  RA Volume:   28.20 ml  18.22 ml/m  AORTIC VALVE AV Vmax:           274.00 cm/s AV Vmean:          193.000 cm/s AV VTI:            0.560 m AV Peak Grad:      30.0 mmHg AV Mean Grad:      17.0 mmHg LVOT Vmax:         151.70 cm/s LVOT Vmean:        98.150 cm/s LVOT VTI:          0.294 m LVOT/AV VTI ratio: 0.53 AI PHT:            257 msec  AORTA Ao Asc diam: 3.00 cm MITRAL VALVE                TRICUSPID VALVE MV Area (PHT): 3.60 cm     TR Peak grad:   40.4 mmHg MV Peak grad:  10.1 mmHg    TR Vmax:        318.00 cm/s MV Mean grad:  4.0 mmHg MV Vmax:       1.59 m/s     SHUNTS MV Vmean:      92.0 cm/s    Systemic VTI: 0.29 m MV Decel Time: 211 msec MR Peak grad: 117.5 mmHg MR Mean grad: 66.0 mmHg MR Vmax:      542.00 cm/s MR Vmean:     384.0 cm/s MV E velocity:  145.00 cm/s MV A velocity: 115.00 cm/s MV E/A ratio:  1.26 Nona Dell MD Electronically signed by Nona Dell MD Signature Date/Time: 07/15/2022/5:04:37 PM    Final    DG CHEST PORT 1 VIEW  Result Date: 07/15/2022 CLINICAL DATA:  Respiratory failure EXAM: PORTABLE CHEST 1 VIEW COMPARISON:  Chest radiograph 1 day prior FINDINGS: Sternotomy wires are unchanged. Cardiomegaly is unchanged. There are dense mitral annular calcifications and extensive calcified atherosclerotic plaque throughout the thoracic aorta. There are bilateral pleural effusions with bibasilar opacities, worsened in the interim. There are coarsened interstitial markings throughout the lungs, likely chronic. There is possible mild pulmonary interstitial edema. There is no pneumothorax. The bones are stable. IMPRESSION: 1. Bilateral pleural effusions with adjacent airspace opacities in the lung bases and possible  mild pulmonary interstitial edema, worsened since the study from 1 day prior. Findings may reflect CHF. 2. Unchanged cardiomegaly with dense mitral annular calcifications. Electronically Signed   By: Lesia Hausen M.D.   On: 07/15/2022 10:15   CT HEAD WO CONTRAST ( )  Result Date: 07/14/2022 CLINICAL DATA:  Altered mental status with change in pupil size since prior study. EXAM: CT HEAD WITHOUT CONTRAST TECHNIQUE: Contiguous axial images were obtained from the base of the skull through the vertex without intravenous contrast. RADIATION DOSE REDUCTION: This exam was performed according to the departmental dose-optimization program which includes automated exposure control, adjustment of the mA and/or kV according to patient size and/or use of iterative reconstruction technique. COMPARISON:  July 14, 2022 (11:12 a.m.) FINDINGS: Brain: There is moderate severity cerebral atrophy with widening of the extra-axial spaces and ventricular dilatation. There are areas of decreased attenuation within the white matter tracts of the supratentorial brain, consistent with microvascular disease changes. Vascular: No hyperdense vessel or unexpected calcification. Skull: Normal. Negative for fracture or focal lesion. Sinuses/Orbits: No acute finding. Other: None. IMPRESSION: 1. No acute intracranial abnormality. 2. Generalized cerebral atrophy with chronic white matter small vessel ischemic changes. Electronically Signed   By: Aram Candela M.D.   On: 07/14/2022 23:06    Procedures Procedures    Medications Ordered in ED Medications  lactated ringers bolus 1,000 mL (0 mLs Intravenous Stopped 07/14/22 1249)  ceFEPIme (MAXIPIME) 2 g in sodium chloride 0.9 % 100 mL IVPB (0 g Intravenous Stopped 07/14/22 1358)  metroNIDAZOLE (FLAGYL) IVPB 500 mg (0 mg Intravenous Stopped 07/14/22 1419)  vancomycin (VANCOCIN) IVPB 1000 mg/200 mL premix (0 mg Intravenous Stopped 07/14/22 1419)  methylPREDNISolone sodium succinate  (SOLU-MEDROL) 125 mg/2 mL injection 125 mg (125 mg Intravenous Given 07/14/22 1304)  ipratropium-albuterol (DUONEB) 0.5-2.5 (3) MG/3ML nebulizer solution 3 mL (3 mLs Nebulization Given 07/14/22 1315)  iohexol (OMNIPAQUE) 350 MG/ML injection 100 mL (60 mLs Intravenous Contrast Given 07/14/22 1412)  furosemide (LASIX) injection 40 mg (40 mg Intravenous Given 07/14/22 1808)  furosemide (LASIX) injection 80 mg (80 mg Intravenous Given 07/15/22 1016)    ED Course/ Medical Decision Making/ A&P                           Medical Decision Making Amount and/or Complexity of Data Reviewed Labs: ordered. Radiology: ordered.  Risk Prescription drug management. Decision regarding hospitalization.   This patient presents to the ED for concern of altered mental status, this involves an extensive number of treatment options, and is a complaint that carries with it a high risk of complications and morbidity.  The differential diagnosis includes CVA, ICH, polypharmacy, medication withdrawal, sepsis, hypoglycemia, metabolic  derangements   Co morbidities that complicate the patient evaluation  HLD, HTN, CAD, carotid artery stenosis, CVA, aortic valve replacement, dementia, CHF, anemia, COPD, chronic hypoxia, CKD.   Additional history obtained:  Additional history obtained from EMS, patient's daughter External records from outside source obtained and reviewed including EMR   Lab Tests:  I Ordered, and personally interpreted labs.  The pertinent results include: Slight increase in creatinine from labs 1 year ago, normal electrolytes, supratherapeutic INR, mild leukocytosis, baseline anemia, mild elevation in troponins, initially normal lactate that trended to 2.9 while in the ED.   Imaging Studies ordered:  I ordered imaging studies including CT of head, chest, abdomen, pelvis.  CTA of head and neck I independently visualized and interpreted imaging which showed no findings on CT head or CTA head and  neck to suggest central etiology.  No acute findings on CT of chest, abdomen, and pelvis. I agree with the radiologist interpretation   Cardiac Monitoring: / EKG:  The patient was maintained on a cardiac monitor.  I personally viewed and interpreted the cardiac monitored which showed an underlying rhythm of: Sinus rhythm  Problem List / ED Course / Critical interventions / Medication management  Patient is a 88 year old female presenting for altered mental status.  Onset was this morning upon awakening at 8:30 AM.  History is provided by EMS and family.  Family states that she is normally conversant and alert and oriented.  She was in her normal state of mentation yesterday.  She did have a fall yesterday during which she struck her elbow and stomach.  Patient is a GCS of 10 on arrival.  Vital signs notable for tachycardia and tachypnea.  She has increased work of breathing and coarse breath sounds on lung auscultation.  She arrives on nonrebreather and remained on nonrebreather to maintain normal SPO2.  Bolus of IV fluids was ordered.  Although patient has pinpoint pupils, it does not appear that she is prescribed any narcotic medications.  There is low suspicion for opiate overdose given her tachypnea.  Patient was taken to CT scanner for stat CT of head.  CT head did not reveal any ICH or LVO.  Patient remained at CT to undergo imaging of chest, abdomen, and pelvis.  Patient's family confirms that patient is DNR/DNI.  Although no clear source of infection exist at this point, patient was treated empirically for sepsis with IV fluids and broad-spectrum antibiotics.  At this time, she is not able to tolerate BiPAP.  DuoNeb and Solu-Medrol ordered to optimize lung function.  CTA of head and neck was ordered to rule out brainstem CVA.  CTA did not show any acute findings.  Patient did have improvement in tachycardia and mentation while in the ED.  Work of breathing remained increased.  She was admitted for  further management. I ordered medication including IV fluids for tachycardia and empiric treatment of sepsis; broad-spectrum antibiotics for presumed sepsis; Solu-Medrol and DuoNeb for COPD Reevaluation of the patient after these medicines showed that the patient improved I have reviewed the patients home medicines and have made adjustments as needed   Social Determinants of Health:  Lives at home with caregiver present during daytime  CRITICAL CARE Performed by: Gloris Manchester   Total critical care time: 45 minutes  Critical care time was exclusive of separately billable procedures and treating other patients.  Critical care was necessary to treat or prevent imminent or life-threatening deterioration.  Critical care was time spent personally by me on  the following activities: development of treatment plan with patient and/or surrogate as well as nursing, discussions with consultants, evaluation of patient's response to treatment, examination of patient, obtaining history from patient or surrogate, ordering and performing treatments and interventions, ordering and review of laboratory studies, ordering and review of radiographic studies, pulse oximetry and re-evaluation of patient's condition.          Final Clinical Impression(s) / ED Diagnoses Final diagnoses:  Glasgow coma scale total score 9-12, in the field (EMT or ambulance)    Rx / DC Orders ED Discharge Orders          Ordered    Morphine Sulfate (MORPHINE CONCENTRATE) 10 MG/0.5ML SOLN concentrated solution  Every 1 hour PRN        07/16/22 1026    Increase activity slowly        07/16/22 1026    Diet - low sodium heart healthy        07/16/22 1026    diazepam (VALIUM) 1 MG/ML solution  Every 8 hours PRN,   Status:  Discontinued        07/16/22 1026    LORazepam (ATIVAN) 2 MG/ML concentrated solution  Every 6 hours PRN        07/16/22 1036              Gloris Manchester, MD 07/16/22 2012

## 2022-07-15 ENCOUNTER — Inpatient Hospital Stay (HOSPITAL_COMMUNITY): Payer: Medicare Other

## 2022-07-15 ENCOUNTER — Encounter (HOSPITAL_COMMUNITY): Payer: Self-pay | Admitting: Internal Medicine

## 2022-07-15 ENCOUNTER — Other Ambulatory Visit: Payer: Self-pay

## 2022-07-15 DIAGNOSIS — Z515 Encounter for palliative care: Secondary | ICD-10-CM

## 2022-07-15 DIAGNOSIS — Z7189 Other specified counseling: Secondary | ICD-10-CM

## 2022-07-15 DIAGNOSIS — N1832 Chronic kidney disease, stage 3b: Secondary | ICD-10-CM | POA: Diagnosis not present

## 2022-07-15 DIAGNOSIS — Z66 Do not resuscitate: Secondary | ICD-10-CM | POA: Diagnosis not present

## 2022-07-15 DIAGNOSIS — J9601 Acute respiratory failure with hypoxia: Secondary | ICD-10-CM | POA: Diagnosis not present

## 2022-07-15 DIAGNOSIS — I5021 Acute systolic (congestive) heart failure: Secondary | ICD-10-CM

## 2022-07-15 DIAGNOSIS — G9340 Encephalopathy, unspecified: Secondary | ICD-10-CM | POA: Diagnosis not present

## 2022-07-15 DIAGNOSIS — J449 Chronic obstructive pulmonary disease, unspecified: Secondary | ICD-10-CM | POA: Diagnosis not present

## 2022-07-15 LAB — COMPREHENSIVE METABOLIC PANEL
ALT: 22 U/L (ref 0–44)
AST: 28 U/L (ref 15–41)
Albumin: 3.2 g/dL — ABNORMAL LOW (ref 3.5–5.0)
Alkaline Phosphatase: 67 U/L (ref 38–126)
Anion gap: 8 (ref 5–15)
BUN: 34 mg/dL — ABNORMAL HIGH (ref 8–23)
CO2: 25 mmol/L (ref 22–32)
Calcium: 8.7 mg/dL — ABNORMAL LOW (ref 8.9–10.3)
Chloride: 110 mmol/L (ref 98–111)
Creatinine, Ser: 1.73 mg/dL — ABNORMAL HIGH (ref 0.44–1.00)
GFR, Estimated: 27 mL/min — ABNORMAL LOW (ref >60)
Glucose, Bld: 172 mg/dL — ABNORMAL HIGH (ref 70–99)
Potassium: 4.4 mmol/L (ref 3.5–5.1)
Sodium: 143 mmol/L (ref 135–145)
Total Bilirubin: 0.8 mg/dL (ref 0.3–1.2)
Total Protein: 6.1 g/dL — ABNORMAL LOW (ref 6.5–8.1)

## 2022-07-15 LAB — PROTIME-INR
INR: 4.4 (ref 0.8–1.2)
Prothrombin Time: 41.4 seconds — ABNORMAL HIGH (ref 11.4–15.2)

## 2022-07-15 LAB — CBC
HCT: 28.6 % — ABNORMAL LOW (ref 36.0–46.0)
Hemoglobin: 9 g/dL — ABNORMAL LOW (ref 12.0–15.0)
MCH: 30.6 pg (ref 26.0–34.0)
MCHC: 31.5 g/dL (ref 30.0–36.0)
MCV: 97.3 fL (ref 80.0–100.0)
Platelets: 158 10*3/uL (ref 150–400)
RBC: 2.94 MIL/uL — ABNORMAL LOW (ref 3.87–5.11)
RDW: 14.2 % (ref 11.5–15.5)
WBC: 9.4 10*3/uL (ref 4.0–10.5)
nRBC: 0 % (ref 0.0–0.2)

## 2022-07-15 LAB — BLOOD GAS, ARTERIAL
Acid-Base Excess: 3.2 mmol/L — ABNORMAL HIGH (ref 0.0–2.0)
Bicarbonate: 28.5 mmol/L — ABNORMAL HIGH (ref 20.0–28.0)
Drawn by: 21310
O2 Saturation: 99.1 %
Patient temperature: 37.2
pCO2 arterial: 46 mmHg (ref 32–48)
pH, Arterial: 7.4 (ref 7.35–7.45)
pO2, Arterial: 107 mmHg (ref 83–108)

## 2022-07-15 LAB — ECHOCARDIOGRAM COMPLETE
AV Mean grad: 17 mmHg
AV Peak grad: 30 mmHg
Ao pk vel: 2.74 m/s
Area-P 1/2: 3.6 cm2
Height: 62 in
MV M vel: 5.42 m/s
MV Peak grad: 117.5 mmHg
P 1/2 time: 257 msec
S' Lateral: 4.7 cm
Single Plane A2C EF: 32.2 %
Weight: 1947.1 oz

## 2022-07-15 LAB — LACTIC ACID, PLASMA: Lactic Acid, Venous: 1.7 mmol/L (ref 0.5–1.9)

## 2022-07-15 LAB — AMMONIA: Ammonia: 22 umol/L (ref 9–35)

## 2022-07-15 LAB — BRAIN NATRIURETIC PEPTIDE: B Natriuretic Peptide: 3714 pg/mL — ABNORMAL HIGH (ref 0.0–100.0)

## 2022-07-15 LAB — MRSA NEXT GEN BY PCR, NASAL: MRSA by PCR Next Gen: NOT DETECTED

## 2022-07-15 MED ORDER — LORAZEPAM 2 MG/ML IJ SOLN
1.0000 mg | INTRAMUSCULAR | Status: DC | PRN
  Administered 2022-07-15: 1 mg via INTRAVENOUS
  Filled 2022-07-15: qty 1

## 2022-07-15 MED ORDER — ORAL CARE MOUTH RINSE: 15.0000 mL | OROMUCOSAL | Status: DC | PRN

## 2022-07-15 MED ORDER — CHLORHEXIDINE GLUCONATE CLOTH 2 % EX PADS
6.0000 | MEDICATED_PAD | Freq: Every day | CUTANEOUS | Status: DC
Start: 2022-07-15 — End: 2022-07-16
  Administered 2022-07-15: 6 via TOPICAL

## 2022-07-15 MED ORDER — LORAZEPAM 0.5 MG PO TABS: 1.0000 mg | ORAL_TABLET | ORAL | Status: DC | PRN

## 2022-07-15 MED ORDER — MORPHINE SULFATE (CONCENTRATE) 10 MG/0.5ML PO SOLN
5.0000 mg | ORAL | Status: DC | PRN
  Administered 2022-07-15: 5 mg via SUBLINGUAL
  Filled 2022-07-15: qty 0.5

## 2022-07-15 MED ORDER — ORAL CARE MOUTH RINSE
15.0000 mL | OROMUCOSAL | Status: DC
  Administered 2022-07-15 – 2022-07-16 (×5): 15 mL via OROMUCOSAL

## 2022-07-15 MED ORDER — FUROSEMIDE 10 MG/ML IJ SOLN
80.0000 mg | Freq: Once | INTRAMUSCULAR | Status: AC
  Administered 2022-07-15: 80 mg via INTRAVENOUS
  Filled 2022-07-15: qty 8

## 2022-07-15 NOTE — Progress Notes (Signed)
AP IC05 [facility] Hospital Liaison note:  This patient is currently enrolled in [facility] outpatient-based Palliative Care. Will continue to follow for disposition.  Please call with any outpatient palliative questions or concerns.  Thank you, Dee Curry, LPN [facility] Hospital Liaison [phone] 

## 2022-07-15 NOTE — Progress Notes (Signed)
  Echocardiogram 2D Echocardiogram has been performed.  [name] 07/15/2022, 4:09 PM

## 2022-07-15 NOTE — Consult Note (Addendum)
Consultation Note Date: 07/15/2022   Patient Name: [name]  DOB: 1926-04-27  MRN: [id number]  Age / Sex: 88 y.o., female  PCP: Celene Squibb, MD Referring Physician: Kathie Dike, MD  Reason for Consultation: Goals of care  HPI/Patient Profile: 88 y.o. female  with past medical history of CHF (last ECHO on file indicates EF 55-65%, however, her Granddaughter notes it was reread by her cardiologist and they had indicated more likely 25-30%), COPD, on oxygen at home, mechanical aortic valve on anticoag, CKD 3, admitted on 07/14/2022 with altered mental status.  She had a recent fall at home she was seen by her primary care provider no further follow-up was indicated.  She presented to the emergency room where she was noted to be hypoxic family reports she had become confused and difficult to arouse.  Her head CT scan was negative for any acute findings there was no large vessel occlusion. Unfortunately MRI cannot be performed due to mechanical heart valve.  She had mildly elevated PCO2 and was put on BiPAP, which has now corrected. Chest xray indicates fluid overload and she had been getting IV lasix. There is not a clear etiology for the changes in her mental status, but no reversible causes have been identified.   Primary Decision Maker NEXT OF KIN- her two daughters [name] and [name]  Discussion: I have reviewed medical records including Care Everywhere, progress notes from this and prior admissions, labs and imaging, discussed with RN.  On evaluation patient is obtunded.  Her eyes are open but she does not track.  She does not follow commands.  She is not moving any of her extremities.  She seems to be neglecting her left side.  She has increased respiration rate and use of accessory muscles, she is moaning and appears to be attempting to verbalize the word home.  I met with her daughters [name] and  [name], her granddaughter [name] who is a Designer, jewellery at [employer] was present via speaker phone.   Family shared that [name] was known to value her independence.  She was also described as vivacious, opinionated, and a loving mother who is deeply loved by all of her family.  As far as functional and nutritional status-she was living at home with an in-home caretaker assisting her with ADLs.  She was able to ambulate some with assistance.  We discussed patient's current illness and what it means in the larger context of patient's on-going co-morbidities.  Natural disease trajectory and expectations at EOL were discussed.  Family shared that [name] was clear in the fact that she wanted to be a home for her end-of-life.  She had recently shared with them that if she had any decompensation that she was ready to experience natural death.  The difference between continued aggressive medical interventions, continued testing, and comfort care were discussed.   At the close of our discussion family elected to continue current care without escalation.  No further labs, no further testing.  With the goal  being to discharge her home with hospice and comfort interventions.   SUMMARY OF RECOMMENDATIONS -D/C labs -continue current IV medications, lasix, antibiotics until discharge -TOC order for referral to [facility] with continued IV morphine prn for SOB and anxiety -Morphine concentrate 11m sublingual q1hr prn for SOB (request that nursing begin trial of sublingual morphine now so we can determine if this will be effective for her at home -On discharge, would recommend scripts for: - Morphine Concentrate 166m0.58ml: 58m48m0.258m47mublingual every 1 hour as needed for pain or shortness of breath: Disp 30ml33morazepam 2mg/m64moncentrated solution: 1mg (07ml) su91mngual every 4 hours as needed for anxiety: Disp 30ml - H53ml 2mg/ml so6mion: 0.58mg (0.258m54msubli71ml  every 4 hours as needed for agitation or nausea: Disp 30ml      Co1mtatus/Advance Care Planning: DNR   Prognosis:   < 2 weeks  Discharge Planning: Home with Hospice  Primary Diagnoses: Present on Admission:  Acute respiratory failure with hypoxia (HCC)  LBBB (left bundle branch block)  Essential hypertension  COPD GOLD B symptoms/risk   DNR (do not resuscitate)  CKD (chronic kidney disease) stage 3, GFR 30-59 ml/min (HCC)  Hyperlipidemia  CAD, ARTERY BYPASS GRAFT  Dementia (HCC)   Review[facility]  Physical Exam  Vital Signs: BP (!) 126/41   Pulse 69   Temp (!) 97.3 F (36.3 C) (Axillary)   Resp (!) 27   Ht _0  (1.575 m)   Wt 55.2 kg   SpO2 98%   BMI 22.26 kg/m  Pain Scale: PAINAD   Pain Score: Asleep   SpO2: SpO2: 98 % O2 Device:SpO2: 98 % O2 Flow Rate: .O2 Flow Rate (L/min): 5 L/min  IO: Intake/output summary:  Intake/Output Summary (Last 24 hours) at 07/15/2022 1625 Last data filed at 07/15/2022 1500 Gross per 24 hour  Intake 612.5 ml  Output 650 ml  Net -37.5 ml    LBM: Last BM Date : 07/12/22 Baseline Weight: Weight: 53.5 kg Most recent weight: Weight: 55.2 kg       Thank you for this consult. Palliative medicine will continue to follow and assist as needed.  Time Total: 90 minutes Greater than 50%  of this time was spent counseling and coordinating care related to the above assessment and plan.  Signed by: Jahnai Slingerland, [facility]    Please contact Palliative Medicine Team phone at [phone] for [phone] and concerns.  For individual provider: See AmionShea Evans

## 2022-07-15 NOTE — Progress Notes (Signed)
Removed pt from BIPAP and placed on 3 L [place].  Sat 97%, RR 32, HR 78. Daughters and granddaughter at bedside.

## 2022-07-15 NOTE — Progress Notes (Signed)
ANTICOAGULATION CONSULT NOTE   Pharmacy Consult for Coumadin Indication:  AVR  Allergies  Allergen Reactions   Codeine Nausea And Vomiting   Penicillins Other (See Comments)    Unknown reaction - listed on MAR Has patient had a PCN reaction causing immediate rash, facial/tongue/throat swelling, SOB or lightheadedness with hypotension: Unknown Has patient had a PCN reaction causing severe rash involving mucus membranes or skin necrosis: Unknown Has patient had a PCN reaction that required hospitalization: Unknown Has patient had a PCN reaction occurring within the last 10 years: Unknown If all of the above answers are "NO", then may proceed with Cephalosporin use.    Patient Measurements: Height: 5\' 2"  (157.5 cm) Weight: 55.2 kg (121 lb 11.1 oz) IBW/kg (Calculated) : 50.1 Heparin Dosing Weight:   Vital Signs: Temp: 98 F (36.7 C) (08/31 0744) Temp Source: Axillary (08/31 0744) BP: 141/37 (08/31 0753) Pulse Rate: 65 (08/31 0754)  Labs: Recent Labs    07/13/22 0000 07/14/22 1040 07/14/22 1040 07/14/22 1110 07/14/22 1232 07/15/22 0430  HGB  --  9.8*   < > 9.9*  --  9.0*  HCT  --  31.8*  --  29.0*  --  28.6*  PLT  --  171  --   --   --  158  APTT  --  74*  --   --   --   --   LABPROT  --  34.3*  --   --   --  41.4*  INR 3.0 3.4*  --   --   --  4.4*  CREATININE  --  1.54*  --  1.70*  --  1.73*  TROPONINIHS  --  35*  --   --  62*  --    < > = values in this interval not displayed.     Estimated Creatinine Clearance: 16.1 mL/min (A) (by C-G formula based on SCr of 1.73 mg/dL (H)).   Medical History: Past Medical History:  Diagnosis Date   Anemia    Anxiety    Arthritis    Blood transfusion without reported diagnosis    Carotid artery stenosis    Without infarction   Cataract    Chronic combined systolic (congestive) and diastolic (congestive) heart failure (HCC)    COPD (chronic obstructive pulmonary disease) (HCC)    Wears 02 at home 2 L   Coronary artery  disease    Heart valve replaced by other means    Hypercholesterolemia    Pure   Hypertension    Unspecified   LBBB (left bundle branch block)    Macular degeneration (senile) of retina, unspecified    On home O2    Postsurgical aortocoronary bypass status    Stroke (HCC)    Transient global amnesia    Unspecified hereditary and idiopathic peripheral neuropathy    Unspecified vitamin D deficiency     Medications:  Medications Prior to Admission  Medication Sig Dispense Refill Last Dose   acetaminophen (TYLENOL) 650 MG CR tablet Take 650 mg by mouth every 8 (eight) hours as needed for pain.      Aflibercept (EYLEA) 2 MG/0.05ML SOLN by Intravitreal route. Every 9 weeks      ALPRAZolam (XANAX) 0.25 MG tablet Take 0.125-0.25 mg by mouth at bedtime as needed for anxiety.       atorvastatin (LIPITOR) 40 MG tablet TAKE ONE TABLET BY MOUTH ONCE DAILY. 90 tablet 0    baclofen (LIORESAL) 10 MG tablet Take 10 mg by mouth as  needed for muscle spasms.      Cholecalciferol (VITAMIN D3 PO) Take by mouth once a week. On sundays      docusate sodium (COLACE) 100 MG capsule Take 100 mg by mouth as needed.      donepezil (ARICEPT) 10 MG tablet TAKE ONE TABLET BY MOUTH ONCE DAILY. (Patient taking differently: Take 10 mg by mouth at bedtime.) 30 tablet 0    ferrous sulfate 325 (65 FE) MG EC tablet Take 325 mg by mouth daily.       furosemide (LASIX) 40 MG tablet Take 1.5 tablets (60 mg total) by mouth daily. 135 tablet 3    HYDROcodone-acetaminophen (NORCO) 7.5-325 MG tablet Take 1 tablet by mouth every 6 (six) hours as needed for moderate pain.      hydrocortisone (ANUSOL-HC) 2.5 % rectal cream Place 1 application rectally 3 (three) times daily as needed for hemorrhoids or anal itching.      levalbuterol (XOPENEX) 0.63 MG/3ML nebulizer solution USE 1 VIAL VIA NEBULIZER EVERY 8 HOURS AS NEEDED. 150 mL 3    meclizine (ANTIVERT) 25 MG tablet Take 25 mg by mouth 3 (three) times daily as needed for dizziness.       Melatonin 3 MG TABS Take 1 tablet by mouth at bedtime.      methylPREDNISolone (MEDROL) 4 MG tablet Take 4 mg by mouth 2 (two) times daily as needed.      multivitamin-lutein (OCUVITE-LUTEIN) CAPS capsule Take 1 capsule by mouth daily.      ondansetron (ZOFRAN) 4 MG tablet Take 4 mg by mouth every 8 (eight) hours as needed for nausea or vomiting.      polyethylene glycol (MIRALAX / GLYCOLAX) 17 g packet Take 17 g by mouth daily as needed for moderate constipation.      potassium chloride (KLOR-CON) 10 MEQ tablet Take 2 tablets (20 mEq total) by mouth daily. 180 tablet 3    pregabalin (LYRICA) 75 MG capsule TAKE 1 CAPSULE BY MOUTH EVERY OTHER DAY. 15 capsule 0    spironolactone (ALDACTONE) 25 MG tablet Take 0.5 tablets (12.5 mg total) by mouth daily. 45 tablet 3    traZODone (DESYREL) 50 MG tablet Take 0.5-1 tablets (25-50 mg total) by mouth at bedtime as needed for sleep. 30 tablet 3    umeclidinium-vilanterol (ANORO ELLIPTA) 62.5-25 MCG/ACT AEPB USE 1 INHALATION ONCE DAILY. 60 each 3    warfarin (COUMADIN) 5 MG tablet TAKE 1/2 TO 1 TABLET ONCE DAILY AS DIRECTED BY COUMADIN CLINIC. 80 tablet 1    zinc sulfate 50 MG CAPS capsule Take 50 mg by mouth 2 (two) times daily.       Scheduled:   arformoterol  15 mcg Nebulization BID   budesonide (PULMICORT) nebulizer solution  0.25 mg Nebulization BID   Chlorhexidine Gluconate Cloth  6 each Topical Daily   ipratropium-albuterol  3 mL Nebulization Q6H   methylPREDNISolone (SOLU-MEDROL) injection  40 mg Intravenous Q12H   mouth rinse  15 mL Mouth Rinse 4 times per day   Warfarin - Pharmacist Dosing Inpatient   Does not apply q1600   Infusions:   ceFEPime (MAXIPIME) IV     [START ON 07/16/2022] vancomycin      Assessment: Pt with a hx of AVR who was admitted for AMS. Her INR goal of 2.2-3 per office note.   INR 3.4 > 4.4   PTA dose: 2.5mg  qday except 5mg  MWF  Goal of Therapy:  INR 2.2-3 Monitor platelets by anticoagulation protocol:  Yes  Plan:  No coumadin tonight Daily INR  Judeth Cornfield, PharmD Clinical Pharmacist 07/15/2022 8:12 AM

## 2022-07-15 NOTE — TOC Initial Note (Signed)
Transition of Care [facility]) - Initial/Assessment Note    Patient Details  Name: [name] MRN: [id number] Date of Birth: 08/21/1926  [facility] CM/SW Contact:    Leitha Bleak, RN Phone Number: 07/15/2022, 4:27 PM  Clinical Narrative:     Palliative consulted TOC to refer patient to [facility] for home with hospice care. Referral sent and confirm with hospice staff plan is to discharge home tomorrow. They will call family and assess for equipment. TOC to follow.               Expected Discharge Plan: Home w Hospice Care Barriers to Discharge: Continued Medical Work up   Patient Goals and CMS Choice Patient states their goals for this hospitalization and ongoing recovery are:: to go home. CMS Medicare.gov Compare Post Acute Care list provided to:: Patient Represenative (must comment) Choice offered to / list presented to : Adult Children  Expected Discharge Plan and Services Expected Discharge Plan: Home w Hospice Care     Post Acute Care Choice: Hospice     Prior Living Arrangements/Services   Activities of Daily Living Home Assistive Devices/Equipment: Bedside commode/3-in-1, Cane (specify quad or straight), Hospital bed, Oxygen, Shower chair with back, Walker (specify type), Wheelchair ADL Screening (condition at time of admission) Patient's cognitive ability adequate to safely complete daily activities?: No Is the patient deaf or have difficulty hearing?: Yes Does the patient have difficulty seeing, even when wearing glasses/contacts?: No Does the patient have difficulty concentrating, remembering, or making decisions?: Yes Patient able to express need for assistance with ADLs?: Yes Does the patient have difficulty dressing or bathing?: Yes Independently performs ADLs?: No Communication: Independent Dressing (OT): Independent Grooming: Independent Feeding: Independent Bathing: Needs assistance Is this a change from baseline?: Pre-admission  baseline Toileting: Independent with device (comment) (bedside coomode/raised toilet seat) In/Out Bed: Independent with device (comment) Walks in Home: Independent with device (comment) Does the patient have difficulty walking or climbing stairs?: Yes Weakness of Legs: Both Weakness of Arms/Hands: Both  Permission Sought/Granted      Emotional Assessment     Admission diagnosis:  Acute respiratory failure with hypoxia (HCC) [J96.01] Patient Active Problem List   Diagnosis Date Noted   Acute respiratory failure with hypoxia (HCC) 07/14/2022   Acute encephalopathy 07/14/2022   DNR (do not resuscitate) 07/14/2022   Advanced nonexudative age-related macular degeneration of left eye with subfoveal involvement 05/11/2021   Advanced nonexudative age-related macular degeneration of right eye without subfoveal involvement 01/08/2021   Choledocholithiasis 09/08/2020   Hx of CABG 08/20/2020   Ischemic cardiomyopathy 08/20/2020   Orthostatic dizziness 08/20/2020   Chronically dry eyes, bilateral 07/02/2020   Exudative age-related macular degeneration of left eye with active choroidal neovascularization (HCC) 04/30/2020   Exudative age-related macular degeneration of right eye with inactive choroidal neovascularization (HCC) 04/30/2020   Serous detachment of retinal pigment epithelium of right eye 04/30/2020   Acute on chronic respiratory failure with hypoxia and hypercapnia (HCC) 02/07/2020   Palliative care by specialist    Acute on chronic combined systolic and diastolic CHF (congestive heart failure) (HCC) 02/06/2020   Acute on chronic systolic HF (heart failure) (HCC) 02/05/2020   Chronic respiratory failure with hypoxia and hypercapnia (HCC) 01/02/2020   Disorder of arteries and arterioles, unspecified (HCC) 01/01/2020   Cough 12/24/2019   History of COVID-19 11/27/2019   Vitamin D deficiency 11/19/2019   Localized swelling of left foot 11/19/2019   Insomnia 11/19/2019   Pneumonia  due to COVID-19 virus  10/25/2019   Hematoma of thigh, right, initial encounter 07/23/2018   Chronic combined systolic and diastolic CHF (congestive heart failure) (HCC) 07/23/2018   Supratherapeutic INR 07/23/2018   Acute blood loss anemia 07/23/2018   CKD (chronic kidney disease) stage 3, GFR 30-59 ml/min (HCC) 07/23/2018   COPD GOLD B symptoms/risk  07/23/2018   Hematoma of right thigh 07/23/2018   Pressure injury of skin 07/23/2018   Hip fracture (HCC) 07/16/2018   COPD exacerbation (HCC) 07/01/2018   Right hip pain 07/01/2018   Chronic respiratory failure with hypoxia, on home O2 therapy (HCC) 07/01/2018   CHF (congestive heart failure) (HCC) 06/28/2018   Acute diastolic heart failure (HCC) 06/27/2018   Anemia 06/27/2018   Congestive dilated cardiomyopathy (HCC) 09/29/2015   Encounter for therapeutic drug monitoring 12/12/2013   Transient confusion 04/16/2013   Congestive heart failure (HCC) 11/28/2012   Dehydration 11/27/2012   Dementia (HCC) 11/27/2012   Pneumonia 11/24/2012   Aortic valve disorder 01/13/2011   Carotid artery stenosis 10/13/2009   S/P AVR (aortic valve replacement) 10/13/2009   Hyperlipidemia 03/06/2009   Essential hypertension 03/06/2009   CAD, ARTERY BYPASS GRAFT 03/06/2009   LBBB (left bundle branch block) 03/06/2009   Cerebral artery occlusion with cerebral infarction (HCC) 03/06/2009   PCP:  Benita Stabile, MD Pharmacy:   [facility] - [place], [place] - [phone] [place] [address] Phone: [phone] Fax: [phone]  Readmission Risk Interventions    07/15/2022    4:27 PM 09/09/2020    2:45 PM  Readmission Risk Prevention Plan  Transportation Screening Complete Complete  HRI or Home Care Consult Complete Complete  Social Work Consult for Recovery Care Planning/Counseling Complete Complete  Palliative Care Screening Complete Complete  Medication Review Oceanographer) Complete Complete

## 2022-07-15 NOTE — Progress Notes (Signed)
ABG drawn and taken to lab 

## 2022-07-15 NOTE — Progress Notes (Signed)
PROGRESS NOTE    [name]  [id number] DOB: [birth year] DOA: 07/14/2022 PCP: Celene Squibb, MD    Brief Narrative:  [name] is a 88 y.o. female with medical history significant of COPD on home oxygen, chronic diastolic congestive heart failure, mechanical aortic valve replacement on anticoagulation, chronic kidney disease stage IIIb, who normally lives at home.  Patient was reportedly in her usual state of health yesterday when she suffered a fall.  Family reports that she did not hit her head, but did complain of some abdominal pain after her fall.  She is seen her primary care physician who had examined her and did not feel that she needed any underlying imaging.  Per family did give her some baclofen yesterday since they felt it might help with her abdominal pain.  When family tried to wake her up this morning, she was increasingly lethargic.  They noticed that she was having increased work of breathing.  She did not have any recent fever or cough.  History is somewhat limited due to mental status.  Patient is followed by a [facility] palliative care   ED Course: She was evaluated in the emergency room where she was noted to be hypoxic and started on nonrebreather oxygen.  CT angiogram of the head and neck did not show any LVO or new stroke.  She had a CT of her chest, abdomen pelvis that did not show any acute findings.  Creatinine near baseline at 1.5.  PCO2 on VBG noted to be in the 60s with a pH of 7.31   Assessment & Plan:   Principal Problem:   Acute respiratory failure with hypoxia (HCC) Active Problems:   Hyperlipidemia   Essential hypertension   CAD, ARTERY BYPASS GRAFT   LBBB (left bundle branch block)   S/P AVR (aortic valve replacement)   Dementia (HCC)   CKD (chronic kidney disease) stage 3, GFR 30-59 ml/min (HCC)   COPD GOLD B symptoms/risk    Acute encephalopathy   DNR (do not resuscitate)   Acute encephalopathy,  -suspect multifactorial related to  metabolic factors including elevated PCO2 as well as polypharmacy since she is on baclofen and has listed hydrocodone, Lyrica -CT imaging of her brain did not show any acute changes -She does not have any obvious source of infection including UTI or pneumonia -Continue to follow clinically -Cannot MRI brain due to mechanical heart valve   Acute on chronic respiratory failure with hypoxia and hypercapnia -She is chronically on 3 to 4 L of oxygen -PCO2 elevated on admission, but has normalized with bipap -will try off bipap and monitor resp status   COPD -She does have increased work of breathing -Continue on steroids and bronchodilators   Elevated lactate -She is not hypotensive, this may be related to hypoxia -She has been started on broad-spectrum IV antibiotics to cover for possible sepsis, would continue for now -lactate in normal range this morning   Chronic kidney disease stage IIIb -Baseline creatinine approximately 1.3-1.5 -Creatinine currently at 1.7 -Continue to monitor   Acute on chronic diastolic congestive heart failure -Last echo from 08/2020 showed grade 1 diastolic dysfunction and EF 55 to 60% -Chest x-ray indicated some evidence of volume overload -We will give 1 dose of intravenous Lasix -BNP 825->3714 -will update echo -repeat chest xray today since BNP significantly higher today and seems more short of breath -give another dose of lasix today   Status post aortic valve replacement -Continue anticoagulation with Coumadin -Pharmacy to  follow   Baseline cognitive impairment -Can resume Aricept if mental status improves   Goals of care -Family confirms DNR status -She is followed by palliative care as an outpatient setting -If she fails to make meaningful improvements, will consider transition to comfort measures -Requested palliative care follow for symptoms management and further discussions around goals   DVT prophylaxis: warfarin  Code Status:  DNR Family Communication: discussed with daughters Disposition Plan: Status is: Inpatient Remains inpatient appropriate because: continued management of altered mental status and respiratory failure     Consultants:  Palliative care  Procedures:    Antimicrobials:  Vancomycin 8/30> Cefepime 8/30>    Subjective: On bipap, she has her eyes open, but does not answer questions or follow directions  Objective: Vitals:   07/15/22 0753 07/15/22 0754 07/15/22 0800 07/15/22 0925  BP: (!) 141/37  132/70   Pulse: 75 65 66 83  Resp: (!) 41 (!) 34 (!) 23 (!) 45  Temp:      TempSrc:      SpO2: 100% 100% 100% 97%  Weight:      Height:        Intake/Output Summary (Last 24 hours) at 07/15/2022 0950 Last data filed at 07/15/2022 0600 Gross per 24 hour  Intake 1912.5 ml  Output 670 ml  Net 1242.5 ml   Filed Weights   07/14/22 1032 07/14/22 1622 07/15/22 0500  Weight: 53.5 kg 55.6 kg 55.2 kg    Examination:  General exam: increased resp effort, eyes open but does not answer questions Respiratory system: Clear to auscultation. Increased resp effort, on bipap Cardiovascular system: S1 & S2 heard, RRR. No JVD, murmurs, rubs, gallops or clicks. No pedal edema. Gastrointestinal system: Abdomen is nondistended, soft and nontender. No organomegaly or masses felt. Normal bowel sounds heard. Central nervous system: No focal neurological deficits. Pupils constricted but appear equal bilaterally Extremities: Symmetric 5 x 5 power. Skin: No rashes, lesions or ulcers Psychiatry: Judgement and insight appear normal. Mood & affect appropriate.     Data Reviewed: I have personally reviewed following labs and imaging studies  CBC: Recent Labs  Lab 07/14/22 1040 07/14/22 1110 07/15/22 0430  WBC 10.6*  --  9.4  NEUTROABS 8.0*  --   --   HGB 9.8* 9.9* 9.0*  HCT 31.8* 29.0* 28.6*  MCV 98.5  --  97.3  PLT 171  --  0000000   Basic Metabolic Panel: Recent Labs  Lab 07/14/22 1040  07/14/22 1110 07/15/22 0430  NA 143 142 143  K 4.4 4.1 4.4  CL 108 110 110  CO2 27  --  25  GLUCOSE 112* 115* 172*  BUN 30* 32* 34*  CREATININE 1.54* 1.70* 1.73*  CALCIUM 9.2  --  8.7*   GFR: Estimated Creatinine Clearance: 16.1 mL/min (A) (by C-G formula based on SCr of 1.73 mg/dL (H)). Liver Function Tests: Recent Labs  Lab 07/14/22 1040 07/15/22 0430  AST 28 28  ALT 21 22  ALKPHOS 81 67  BILITOT 1.0 0.8  PROT 7.5 6.1*  ALBUMIN 4.0 3.2*   No results for input(s): "LIPASE", "AMYLASE" in the last 168 hours. No results for input(s): "AMMONIA" in the last 168 hours. Coagulation Profile: Recent Labs  Lab 07/13/22 0000 07/14/22 1040 07/15/22 0430  INR 3.0 3.4* 4.4*   Cardiac Enzymes: No results for input(s): "CKTOTAL", "CKMB", "CKMBINDEX", "TROPONINI" in the last 168 hours. BNP (last 3 results) No results for input(s): "PROBNP" in the last 8760 hours. HbA1C: No results for input(s): "  HGBA1C" in the last 72 hours. CBG: Recent Labs  Lab 07/14/22 1028 07/14/22 1619  GLUCAP 100* 149*   Lipid Profile: No results for input(s): "CHOL", "HDL", "LDLCALC", "TRIG", "CHOLHDL", "LDLDIRECT" in the last 72 hours. Thyroid Function Tests: Recent Labs    07/14/22 1040  TSH 2.535   Anemia Panel: No results for input(s): "VITAMINB12", "FOLATE", "FERRITIN", "TIBC", "IRON", "RETICCTPCT" in the last 72 hours. Sepsis Labs: Recent Labs  Lab 07/14/22 1104 07/14/22 1232 07/14/22 1550 07/15/22 0430  LATICACIDVEN 1.3 2.9* 2.9* 1.7    Recent Results (from the past 240 hour(s))  Resp Panel by RT-PCR (Flu A&B, Covid) Anterior Nasal Swab     Status: None   Collection Time: 07/14/22 10:40 AM   Specimen: Anterior Nasal Swab  Result Value Ref Range Status   SARS Coronavirus 2 by RT PCR NEGATIVE NEGATIVE Final    Comment: (NOTE) SARS-CoV-2 target nucleic acids are NOT DETECTED.  The SARS-CoV-2 RNA is generally detectable in upper respiratory specimens during the acute phase of  infection. The lowest concentration of SARS-CoV-2 viral copies this assay can detect is 138 copies/mL. A negative result does not preclude SARS-Cov-2 infection and should not be used as the sole basis for treatment or other patient management decisions. A negative result may occur with  improper specimen collection/handling, submission of specimen other than nasopharyngeal swab, presence of viral mutation(s) within the areas targeted by this assay, and inadequate number of viral copies(<138 copies/mL). A negative result must be combined with clinical observations, patient history, and epidemiological information. The expected result is Negative.  Fact Sheet for Patients:  BloggerCourse.com  Fact Sheet for Healthcare Providers:  SeriousBroker.it  This test is no t yet approved or cleared by the [place] FDA and  has been authorized for detection and/or diagnosis of SARS-CoV-2 by FDA under an Emergency Use Authorization (EUA). This EUA will remain  in effect (meaning this test can be used) for the duration of the COVID-19 declaration under Section 564(b)(1) of the Act, 21 U.S.C.section 360bbb-3(b)(1), unless the authorization is terminated  or revoked sooner.       Influenza A by PCR NEGATIVE NEGATIVE Final   Influenza B by PCR NEGATIVE NEGATIVE Final    Comment: (NOTE) The Xpert Xpress SARS-CoV-2/FLU/RSV plus assay is intended as an aid in the diagnosis of influenza from Nasopharyngeal swab specimens and should not be used as a sole basis for treatment. Nasal washings and aspirates are unacceptable for Xpert Xpress SARS-CoV-2/FLU/RSV testing.  Fact Sheet for Patients: BloggerCourse.com  Fact Sheet for Healthcare Providers: SeriousBroker.it  This test is not yet approved or cleared by the [place] FDA and has been authorized for detection and/or diagnosis of SARS-CoV-2  by FDA under an Emergency Use Authorization (EUA). This EUA will remain in effect (meaning this test can be used) for the duration of the COVID-19 declaration under Section 564(b)(1) of the Act, 21 U.S.C. section 360bbb-3(b)(1), unless the authorization is terminated or revoked.  Performed at [facility], [address] [place], [place] [address]   Blood Culture (routine x 2)     Status: None (Preliminary result)   Collection Time: 07/14/22 11:04 AM   Specimen: BLOOD  Result Value Ref Range Status   Specimen Description BLOOD LEFT ANTECUBITAL  Final   Special Requests   Final    BOTTLES DRAWN AEROBIC AND ANAEROBIC Blood Culture adequate volume   Culture   Final    NO GROWTH < 24 HOURS Performed at [facility], [address] [place], [place] [address]    Report Status PENDING  Incomplete  Blood Culture (routine x 2)     Status: None (Preliminary result)   Collection Time: 07/14/22 12:31 PM   Specimen: BLOOD  Result Value Ref Range Status   Specimen Description BLOOD LEFT ANTECUBITAL  Final   Special Requests   Final    BOTTLES DRAWN AEROBIC AND ANAEROBIC Blood Culture adequate volume   Culture   Final    NO GROWTH < 24 HOURS Performed at [facility], [address], [place], [place] [address]    Report Status PENDING  Incomplete  MRSA Next Gen by PCR, Nasal     Status: None   Collection Time: 07/14/22  4:06 PM   Specimen: Nasal Mucosa; Nasal Swab  Result Value Ref Range Status   MRSA by PCR Next Gen NOT DETECTED NOT DETECTED Final    Comment: (NOTE) The GeneXpert MRSA Assay (FDA approved for NASAL specimens only), is one component of a comprehensive MRSA colonization surveillance program. It is not intended to diagnose MRSA infection nor to guide or monitor treatment for MRSA infections. Test performance is not FDA approved in patients less than 53 years old. Performed at [facility], [address], [place], [place] [address]          Radiology Studies: CT HEAD  WO CONTRAST (5MM)  Result Date: 07/14/2022 CLINICAL DATA:  Altered mental status with change in pupil size since prior study. EXAM: CT HEAD WITHOUT CONTRAST TECHNIQUE: Contiguous axial images were obtained from the base of the skull through the vertex without intravenous contrast. RADIATION DOSE REDUCTION: This exam was performed according to the departmental dose-optimization program which includes automated exposure control, adjustment of the mA and/or kV according to patient size and/or use of iterative reconstruction technique. COMPARISON:  July 14, 2022 (11:12 a.m.) FINDINGS: Brain: There is moderate severity cerebral atrophy with widening of the extra-axial spaces and ventricular dilatation. There are areas of decreased attenuation within the white matter tracts of the supratentorial brain, consistent with microvascular disease changes. Vascular: No hyperdense vessel or unexpected calcification. Skull: Normal. Negative for fracture or focal lesion. Sinuses/Orbits: No acute finding. Other: None. IMPRESSION: 1. No acute intracranial abnormality. 2. Generalized cerebral atrophy with chronic white matter small vessel ischemic changes. Electronically Signed   By: Virgina Norfolk M.D.   On: 07/14/2022 23:06   CT ANGIO HEAD NECK W WO CM  Result Date: 07/14/2022 CLINICAL DATA:  Neuro deficit, acute, stroke suspected EXAM: CT ANGIOGRAPHY HEAD AND NECK TECHNIQUE: Multidetector CT imaging of the head and neck was performed using the standard protocol during bolus administration of intravenous contrast. Multiplanar CT image reconstructions and MIPs were obtained to evaluate the vascular anatomy. Carotid stenosis measurements (when applicable) are obtained utilizing NASCET criteria, using the distal internal carotid diameter as the denominator. RADIATION DOSE REDUCTION: This exam was performed according to the departmental dose-optimization program which includes automated exposure control, adjustment of the mA  and/or kV according to patient size and/or use of iterative reconstruction technique. CONTRAST:  34mL OMNIPAQUE IOHEXOL 350 MG/ML SOLN COMPARISON:  None Available. FINDINGS: Motion artifact is present. CTA NECK Aortic arch: Calcified plaque along the glued arch. Great vessel origins are patent. No high-grade proximal subclavian stenosis. Right carotid system: Patent. Mild multifocal primarily calcified plaque. There is motion artifact through the bifurcation precluding evaluation at this level. Left carotid system: Patent. Mild multifocal calcified plaque. No hemodynamically significant stenosis. Vertebral arteries: Patent. Left vertebral is dominant with dolichoectasia. No  significant stenosis. Skeleton: Degenerative changes of the included spine. Other neck: Unremarkable. Upper chest: Increased small left pleural effusion. Review of the MIP images confirms the above findings CTA HEAD Anterior circulation: Intracranial internal carotid arteries are patent. Calcified plaque is present causing up to moderate stenosis in the right proximal supraclinoid region. Mild stenosis is present on the left. Anterior and middle cerebral arteries are patent. Posterior circulation: Intracranial vertebral arteries are patent. Right vertebral terminates as a PICA. Basilar artery is patent. Major cerebellar artery origins are patent. Posterior cerebral arteries are patent. Venous sinuses: Patent as allowed by contrast bolus timing. Review of the MIP images confirms the above findings IMPRESSION: Motion artifact. No large vessel occlusion, hemodynamically significant stenosis, or evidence of dissection. The right ICA origin cannot be adequately evaluated due to motion degradation. Increased small left pleural effusion. Electronically Signed   By: Guadlupe Spanish M.D.   On: 07/14/2022 14:26   CT CHEST ABDOMEN PELVIS WO CONTRAST  Result Date: 07/14/2022 CLINICAL DATA:  Altered mental status.  Status post fall yesterday. EXAM: CT  CHEST, ABDOMEN AND PELVIS WITHOUT CONTRAST TECHNIQUE: Multidetector CT imaging of the chest, abdomen and pelvis was performed following the standard protocol without IV contrast. Exam is limited due to persistent patient motion artifact. RADIATION DOSE REDUCTION: This exam was performed according to the departmental dose-optimization program which includes automated exposure control, adjustment of the mA and/or kV according to patient size and/or use of iterative reconstruction technique. COMPARISON:  September 08, 2020. FINDINGS: CT CHEST FINDINGS Cardiovascular: Status post aortic valve repair and coronary artery bypass graft. Atherosclerosis of thoracic aorta is noted without aneurysm formation. Mild cardiomegaly is noted. No pericardial effusion is noted. Mediastinum/Nodes: No enlarged mediastinal, hilar, or axillary lymph nodes. Thyroid gland, trachea, and esophagus demonstrate no significant findings. Lungs/Pleura: No pneumothorax or pleural effusion is noted. Minimal bilateral posterior basilar subsegmental atelectasis is noted. Mild emphysematous disease is noted. Mild biapical scarring is noted. Musculoskeletal: No chest wall mass or suspicious bone lesions identified. CT ABDOMEN PELVIS FINDINGS Hepatobiliary: No focal liver abnormality is seen. No gallstones, gallbladder wall thickening, or biliary dilatation. Pancreas: Unremarkable. No pancreatic ductal dilatation or surrounding inflammatory changes. Spleen: Normal in size without focal abnormality. Adrenals/Urinary Tract: Adrenal glands are unremarkable. Kidneys are normal, without renal calculi, focal lesion, or hydronephrosis. Bladder is unremarkable. Stomach/Bowel: The stomach is unremarkable. There is no evidence of bowel obstruction or inflammation. Status post appendectomy. Sigmoid diverticulosis is noted without inflammation. Vascular/Lymphatic: 3.3 cm infrarenal abdominal aortic aneurysm is noted. No adenopathy is noted. Reproductive: Uterus and  bilateral adnexa are unremarkable. Other: No abdominal wall hernia or abnormality. No abdominopelvic ascites. Musculoskeletal: No acute or significant osseous findings. IMPRESSION: Limited exam due to persistent patient motion artifact. Mild biapical scarring. Minimal bilateral posterior basilar subsegmental atelectasis. 3.3 cm infrarenal abdominal aortic aneurysm. Recommend follow-up ultrasound every 3 years. This recommendation follows ACR consensus guidelines: White Paper of the ACR Incidental Findings Committee II on Vascular Findings. J Am Coll Radiol 2013; 10:789-794. Sigmoid diverticulosis without inflammation. Aortic Atherosclerosis (ICD10-I70.0) and Emphysema (ICD10-J43.9). Electronically Signed   By: Lupita Raider M.D.   On: 07/14/2022 11:33   CT HEAD WO CONTRAST  Result Date: 07/14/2022 CLINICAL DATA:  Provided history: Mental status change, unknown cause. EXAM: CT HEAD WITHOUT CONTRAST TECHNIQUE: Contiguous axial images were obtained from the base of the skull through the vertex without intravenous contrast. RADIATION DOSE REDUCTION: This exam was performed according to the departmental dose-optimization program which includes automated exposure control,  adjustment of the mA and/or kV according to patient size and/or use of iterative reconstruction technique. COMPARISON:  Prior head CT examinations 10/25/2019 and earlier. FINDINGS: Mild-to-moderately motion degraded exam, limiting evaluation. Brain: Moderate generalized cerebral atrophy. Mild cerebellar atrophy. Mild-to-moderate patchy and ill-defined hypoattenuation within the cerebral white matter, nonspecific but compatible with chronic small vessel ischemic disease. There is no acute intracranial hemorrhage. No demarcated cortical infarct. No extra-axial fluid collection. No evidence of an intracranial mass. No midline shift. Vascular: No hyperdense vessel. Atherosclerotic calcifications. Skull: No fracture or aggressive osseous lesion.  Sinuses/Orbits: No mass or acute finding within the imaged orbits. No significant paranasal sinus disease at the imaged levels. IMPRESSION: Motion degraded examination. No evidence of acute intracranial abnormality. Mild-to-moderate chronic small vessel image changes within the cerebral white matter. Moderate generalized cerebral atrophy. Mild cerebellar atrophy. Electronically Signed   By: Kellie Simmering D.O.   On: 07/14/2022 11:22   DG Chest Port 1 View  Result Date: 07/14/2022 CLINICAL DATA:  Questionable sepsis, altered mental status, pinpoint pupils EXAM: PORTABLE CHEST 1 VIEW COMPARISON:  09/15/2020 FINDINGS: Cardiomegaly status post median sternotomy and CABG. Diffuse bilateral interstitial opacity and probable small pleural effusions. No focal airspace opacity. The visualized skeletal structures are unremarkable. IMPRESSION: Cardiomegaly with diffuse bilateral interstitial opacity and probable small pleural effusions, findings most consistent with pulmonary edema. No focal airspace opacity. Electronically Signed   By: Delanna Ahmadi M.D.   On: 07/14/2022 10:59        Scheduled Meds:  arformoterol  15 mcg Nebulization BID   budesonide (PULMICORT) nebulizer solution  0.25 mg Nebulization BID   Chlorhexidine Gluconate Cloth  6 each Topical Daily   ipratropium-albuterol  3 mL Nebulization Q6H   methylPREDNISolone (SOLU-MEDROL) injection  40 mg Intravenous Q12H   mouth rinse  15 mL Mouth Rinse 4 times per day   Warfarin - Pharmacist Dosing Inpatient   Does not apply q1600   Continuous Infusions:  ceFEPime (MAXIPIME) IV     [START ON 07/16/2022] vancomycin       LOS: 1 day    Time spent: 61mins    Kathie Dike, MD [facility]   If 7PM-7AM, please contact night-coverage [email or web address]  07/15/2022, 9:50 AM

## 2022-07-16 DIAGNOSIS — G9340 Encephalopathy, unspecified: Secondary | ICD-10-CM | POA: Diagnosis not present

## 2022-07-16 DIAGNOSIS — I5021 Acute systolic (congestive) heart failure: Secondary | ICD-10-CM | POA: Diagnosis not present

## 2022-07-16 DIAGNOSIS — J9601 Acute respiratory failure with hypoxia: Secondary | ICD-10-CM | POA: Diagnosis not present

## 2022-07-16 DIAGNOSIS — N1832 Chronic kidney disease, stage 3b: Secondary | ICD-10-CM | POA: Diagnosis not present

## 2022-07-16 MED ORDER — LORAZEPAM 2 MG/ML PO CONC: 1.0000 mg | Freq: Four times a day (QID) | ORAL | 0 refills | Status: AC | PRN

## 2022-07-16 MED ORDER — DIAZEPAM 1 MG/ML PO SOLN: 2.0000 mg | Freq: Three times a day (TID) | ORAL | 0 refills | Status: DC | PRN

## 2022-07-16 MED ORDER — MORPHINE SULFATE (CONCENTRATE) 10 MG/0.5ML PO SOLN: 5.0000 mg | ORAL | 0 refills | Status: AC | PRN

## 2022-07-16 NOTE — Progress Notes (Signed)
Hospice care provided transport for patient. MD came and spoke with the family. PRN morphine given shortly before leaving. Both peripheral iv's were removed and all medical devices taken off. AVS paperwork given to daughter to take home. Patient appeared comfortable and resting upon departure. She has been discharged to home.

## 2022-07-16 NOTE — TOC Transition Note (Signed)
Transition of Care [facility]) - CM/SW Discharge Note   Patient Details  Name: GABRIELE ZWILLING MRN: [id number] Date of Birth: Jun 28, 1926  Transition of Care [facility]) CM/SW Contact:  Karn Cassis, LCSW Phone Number: 07/16/2022, 10:35 AM   Clinical Narrative: Pt d/c today. Hospice has arranged transport and they are aware pt will need hospital bed. Per hospice, pt already has home O2.       Final next level of care: Home w Hospice Care Barriers to Discharge: Continued Medical Work up   Patient Goals and CMS Choice Patient states their goals for this hospitalization and ongoing recovery are:: to go home. CMS Medicare.gov Compare Post Acute Care list provided to:: Patient Represenative (must comment) Choice offered to / list presented to : Adult Children  Discharge Placement                       Discharge Plan and Services     Post Acute Care Choice: Hospice                               Social Determinants of Health (SDOH) Interventions     Readmission Risk Interventions    07/15/2022    4:27 PM 09/09/2020    2:45 PM  Readmission Risk Prevention Plan  Transportation Screening Complete Complete  HRI or Home Care Consult Complete Complete  Social Work Consult for Recovery Care Planning/Counseling Complete Complete  Palliative Care Screening Complete Complete  Medication Review [employer]ographer) Complete Complete

## 2022-07-16 NOTE — Progress Notes (Signed)
ANTICOAGULATION CONSULT NOTE   Pharmacy Consult for Coumadin Indication:  AVR  Allergies  Allergen Reactions   Codeine Nausea And Vomiting   Penicillins Other (See Comments)    Unknown reaction - listed on MAR Has patient had a PCN reaction causing immediate rash, facial/tongue/throat swelling, SOB or lightheadedness with hypotension: Unknown Has patient had a PCN reaction causing severe rash involving mucus membranes or skin necrosis: Unknown Has patient had a PCN reaction that required hospitalization: Unknown Has patient had a PCN reaction occurring within the last 10 years: Unknown If all of the above answers are "NO", then may proceed with Cephalosporin use.    Patient Measurements: Height: 5\' 2"  (157.5 cm) Weight: 55.2 kg (121 lb 11.1 oz) IBW/kg (Calculated) : 50.1  Vital Signs: BP: 127/40 (09/01 0400) Pulse Rate: 113 (09/01 0700)  Labs: Recent Labs    07/14/22 1040 07/14/22 1110 07/14/22 1232 07/15/22 0430  HGB 9.8* 9.9*  --  9.0*  HCT 31.8* 29.0*  --  28.6*  PLT 171  --   --  158  APTT 74*  --   --   --   LABPROT 34.3*  --   --  41.4*  INR 3.4*  --   --  4.4*  CREATININE 1.54* 1.70*  --  1.73*  TROPONINIHS 35*  --  62*  --      Estimated Creatinine Clearance: 16.1 mL/min (A) (by C-G formula based on SCr of 1.73 mg/dL (H)).   Medical History: Past Medical History:  Diagnosis Date   Anemia    Anxiety    Arthritis    Blood transfusion without reported diagnosis    Carotid artery stenosis    Without infarction   Cataract    Chronic combined systolic (congestive) and diastolic (congestive) heart failure (HCC)    COPD (chronic obstructive pulmonary disease) (HCC)    Wears 02 at home 2 L   Coronary artery disease    Heart valve replaced by other means    Hypercholesterolemia    Pure   Hypertension    Unspecified   LBBB (left bundle branch block)    Macular degeneration (senile) of retina, unspecified    On home O2    Postsurgical aortocoronary  bypass status    Stroke (HCC)    Transient global amnesia    Unspecified hereditary and idiopathic peripheral neuropathy    Unspecified vitamin D deficiency     Medications:  Medications Prior to Admission  Medication Sig Dispense Refill Last Dose   acetaminophen (TYLENOL) 650 MG CR tablet Take 650 mg by mouth every 8 (eight) hours as needed for pain.      Aflibercept (EYLEA) 2 MG/0.05ML SOLN by Intravitreal route. Every 9 weeks      ALPRAZolam (XANAX) 0.25 MG tablet Take 0.125-0.25 mg by mouth at bedtime as needed for anxiety.       atorvastatin (LIPITOR) 40 MG tablet TAKE ONE TABLET BY MOUTH ONCE DAILY. 90 tablet 0    baclofen (LIORESAL) 10 MG tablet Take 10 mg by mouth as needed for muscle spasms.      Cholecalciferol (VITAMIN D3 PO) Take by mouth once a week. On sundays      docusate sodium (COLACE) 100 MG capsule Take 100 mg by mouth as needed.      donepezil (ARICEPT) 10 MG tablet TAKE ONE TABLET BY MOUTH ONCE DAILY. (Patient taking differently: Take 10 mg by mouth at bedtime.) 30 tablet 0    ferrous sulfate 325 (65 FE) MG  EC tablet Take 325 mg by mouth daily.       furosemide (LASIX) 40 MG tablet Take 1.5 tablets (60 mg total) by mouth daily. 135 tablet 3    HYDROcodone-acetaminophen (NORCO) 7.5-325 MG tablet Take 1 tablet by mouth every 6 (six) hours as needed for moderate pain.      hydrocortisone (ANUSOL-HC) 2.5 % rectal cream Place 1 application rectally 3 (three) times daily as needed for hemorrhoids or anal itching.      levalbuterol (XOPENEX) 0.63 MG/3ML nebulizer solution USE 1 VIAL VIA NEBULIZER EVERY 8 HOURS AS NEEDED. 150 mL 3    meclizine (ANTIVERT) 25 MG tablet Take 25 mg by mouth 3 (three) times daily as needed for dizziness.      Melatonin 3 MG TABS Take 1 tablet by mouth at bedtime.      methylPREDNISolone (MEDROL) 4 MG tablet Take 4 mg by mouth 2 (two) times daily as needed.      multivitamin-lutein (OCUVITE-LUTEIN) CAPS capsule Take 1 capsule by mouth daily.       ondansetron (ZOFRAN) 4 MG tablet Take 4 mg by mouth every 8 (eight) hours as needed for nausea or vomiting.      polyethylene glycol (MIRALAX / GLYCOLAX) 17 g packet Take 17 g by mouth daily as needed for moderate constipation.      potassium chloride (KLOR-CON) 10 MEQ tablet Take 2 tablets (20 mEq total) by mouth daily. 180 tablet 3    pregabalin (LYRICA) 75 MG capsule TAKE 1 CAPSULE BY MOUTH EVERY OTHER DAY. 15 capsule 0    spironolactone (ALDACTONE) 25 MG tablet Take 0.5 tablets (12.5 mg total) by mouth daily. 45 tablet 3    traZODone (DESYREL) 50 MG tablet Take 0.5-1 tablets (25-50 mg total) by mouth at bedtime as needed for sleep. 30 tablet 3    umeclidinium-vilanterol (ANORO ELLIPTA) 62.5-25 MCG/ACT AEPB USE 1 INHALATION ONCE DAILY. 60 each 3    warfarin (COUMADIN) 5 MG tablet TAKE 1/2 TO 1 TABLET ONCE DAILY AS DIRECTED BY COUMADIN CLINIC. 80 tablet 1    zinc sulfate 50 MG CAPS capsule Take 50 mg by mouth 2 (two) times daily.       Scheduled:   arformoterol  15 mcg Nebulization BID   budesonide (PULMICORT) nebulizer solution  0.25 mg Nebulization BID   Chlorhexidine Gluconate Cloth  6 each Topical Daily   ipratropium-albuterol  3 mL Nebulization Q6H   methylPREDNISolone (SOLU-MEDROL) injection  40 mg Intravenous Q12H   mouth rinse  15 mL Mouth Rinse 4 times per day   Warfarin - Pharmacist Dosing Inpatient   Does not apply q1600   Infusions:   ceFEPime (MAXIPIME) IV Stopped (07/15/22 1356)   vancomycin      Assessment: Pt with a hx of AVR who was admitted for AMS. Her INR goal of 2.2-3 per office note.   INR 3.4 > 4.4 No labs per palliative care. With elevated INR yesterday, will continue to hold Coumadin.  PTA dose: 2.5mg  qday except 5mg  MWF  Goal of Therapy:  INR 2.2-3 Monitor platelets by anticoagulation protocol: Yes   Plan:  No coumadin tonight Daily INR  , BS Pharm D, BCPS Clinical Pharmacist 07/16/2022 8:23 AM

## 2022-07-16 NOTE — Discharge Summary (Signed)
Physician Discharge Summary  [name] [id number] DOB: [birth year] DOA: 07/14/2022  PCP: Colleen Squibb, MD  Admit date: 07/14/2022 Discharge date: 07/16/2022  Admitted From: Home Disposition: Home with hospice  Recommendations for Outpatient Follow-up:  Patient to be discharged home with hospice for end-of-life care   Discharge Condition: Terminal CODE STATUS: DNR Diet recommendation: Regular diet for comfort  Brief/Interim Summary: 88 year old female with a history of COPD on home oxygen, mechanical aortic valve replacement on anticoagulation, chronic kidney disease stage IIIb, who was brought to the hospital with altered mental status.  The patient had a fall the day before, but did not have any head injury.  She was noted by family to be somnolent the following morning.  Imaging done in the emergency room was unrevealing for any acute intracranial process.  She was increasingly short of breath where echocardiogram did show a significant decline in her ejection fraction to 35%.  She received IV Lasix, but unfortunately did not have any significant urine output.  She was seen by palliative care and after discussions with family, decision was made to transition to comfort measures.  Patient did remain lethargic, was not awake enough to eat or drink or even converse.  They have elected to take the patient home with hospice services for end-of-life care and focus care on her quality of life and comfort.  Discharge Diagnoses:  Principal Problem:   Acute respiratory failure with hypoxia (HCC) Active Problems:   Hyperlipidemia   Essential hypertension   CAD, ARTERY BYPASS GRAFT   LBBB (left bundle branch block)   S/P AVR (aortic valve replacement)   Dementia (HCC)   CKD (chronic kidney disease) stage 3, GFR 30-59 ml/min (HCC)   COPD GOLD B symptoms/risk    Acute encephalopathy   DNR (do not resuscitate)   Acute systolic (congestive) heart failure [facility])    Discharge  Instructions  Discharge Instructions     Diet - low sodium heart healthy   Complete by: As directed    Increase activity slowly   Complete by: As directed       Allergies as of 07/16/2022       Reactions   Codeine Nausea And Vomiting   Penicillins Other (See Comments)   Unknown reaction - listed on MAR Has patient had a PCN reaction causing immediate rash, facial/tongue/throat swelling, SOB or lightheadedness with hypotension: Unknown Has patient had a PCN reaction causing severe rash involving mucus membranes or skin necrosis: Unknown Has patient had a PCN reaction that required hospitalization: Unknown Has patient had a PCN reaction occurring within the last 10 years: Unknown If all of the above answers are "NO", then may proceed with Cephalosporin use.        Medication List     STOP taking these medications    ALPRAZolam 0.25 MG tablet Commonly known as: XANAX   atorvastatin 40 MG tablet Commonly known as: LIPITOR   baclofen 10 MG tablet Commonly known as: LIORESAL   docusate sodium 100 MG capsule Commonly known as: COLACE   donepezil 10 MG tablet Commonly known as: ARICEPT   ferrous sulfate 325 (65 FE) MG EC tablet   furosemide 40 MG tablet Commonly known as: LASIX   HYDROcodone-acetaminophen 7.5-325 MG tablet Commonly known as: NORCO   meclizine 25 MG tablet Commonly known as: ANTIVERT   melatonin 3 MG Tabs tablet   methylPREDNISolone 4 MG tablet Commonly known as: MEDROL   multivitamin-lutein Caps capsule   ondansetron 4 MG tablet Commonly  known as: ZOFRAN   potassium chloride 10 MEQ tablet Commonly known as: KLOR-CON   pregabalin 75 MG capsule Commonly known as: LYRICA   spironolactone 25 MG tablet Commonly known as: ALDACTONE   traZODone 50 MG tablet Commonly known as: DESYREL   VITAMIN D3 PO   warfarin 5 MG tablet Commonly known as: COUMADIN   zinc sulfate 50 MG Caps capsule       TAKE these medications     acetaminophen 650 MG CR tablet Commonly known as: TYLENOL Take 650 mg by mouth every 8 (eight) hours as needed for pain.   Anoro Ellipta 62.5-25 MCG/ACT Aepb Generic drug: umeclidinium-vilanterol USE 1 INHALATION ONCE DAILY.   Eylea 2 MG/0.05ML Soln Generic drug: Aflibercept by Intravitreal route. Every 9 weeks   hydrocortisone 2.5 % rectal cream Commonly known as: ANUSOL-HC Place 1 application rectally 3 (three) times daily as needed for hemorrhoids or anal itching.   levalbuterol 0.63 MG/3ML nebulizer solution Commonly known as: XOPENEX USE 1 VIAL VIA NEBULIZER EVERY 8 HOURS AS NEEDED.   LORazepam 2 MG/ML concentrated solution Commonly known as: ATIVAN Take 0.5 mLs (1 mg total) by mouth every 6 (six) hours as needed for anxiety.   morphine CONCENTRATE 10 MG/0.5ML Soln concentrated solution Place 0.25 mLs (5 mg total) under the tongue every hour as needed for shortness of breath or moderate pain.   polyethylene glycol 17 g packet Commonly known as: MIRALAX / GLYCOLAX Take 17 g by mouth daily as needed for moderate constipation.        Allergies  Allergen Reactions   Codeine Nausea And Vomiting   Penicillins Other (See Comments)    Unknown reaction - listed on MAR Has patient had a PCN reaction causing immediate rash, facial/tongue/throat swelling, SOB or lightheadedness with hypotension: Unknown Has patient had a PCN reaction causing severe rash involving mucus membranes or skin necrosis: Unknown Has patient had a PCN reaction that required hospitalization: Unknown Has patient had a PCN reaction occurring within the last 10 years: Unknown If all of the above answers are "NO", then may proceed with Cephalosporin use.    Consultations: Palliative care   Procedures/Studies: ECHOCARDIOGRAM COMPLETE  Result Date: 07/15/2022    ECHOCARDIOGRAM REPORT   Patient Name:   [name] Date of Exam: 07/15/2022 Medical Rec #:  [id number]       Height:       62.0 in  Accession #:    [id number]      Weight:       121.7 lb Date of Birth:  05-09-1926      BSA:          1.548 m Patient Age:    89 years        BP:           137/68 mmHg Patient Gender: F               HR:           74 bpm. Exam Location:  Inpatient Procedure: 2D Echo Indications:    congestive heart failure  History:        Patient has prior history of Echocardiogram examinations. CAD,                 COPD and chronic kidney disease, Aortic Valve Disease and Mitral                 Valve Disease, Arrythmias:LBBB; Risk Factors:Hypertension and  Dyslipidemia.                 Aortic Valve: St. Jude valve is present in the aortic position.  Sonographer:    Delcie Roch RDCS Referring Phys: [phone] [facility] Chandra Feger  Sonographer Comments: Image acquisition challenging due to respiratory motion. IMPRESSIONS  1. Left ventricular ejection fraction, by estimation, is 35 to 40%. The left ventricle has moderately decreased function. The left ventricle demonstrates global hypokinesis. The left ventricular internal cavity size was mildly dilated. There is mild left ventricular hypertrophy. Left ventricular diastolic parameters are indeterminate.  2. Right ventricular systolic function is mildly reduced. The right ventricular size is normal. There is moderately elevated pulmonary artery systolic pressure. The estimated right ventricular systolic pressure is 48.4 mmHg.  3. Left atrial size was mildly dilated.  4. The mitral valve is degenerative. Moderate to severe mitral valve regurgitation. Mild mitral stenosis. The mean mitral valve gradient is 4.0 mmHg. Moderate mitral annular calcification.  5. Tricuspid valve regurgitation is mild to moderate.  6. The aortic valve has been repaired/replaced. Aortic valve regurgitation is moderate. There is a St. Jude valve present in the aortic position. Aortic valve mean gradient measures 17.0 mmHg.  7. The inferior vena cava is normal in size with <50% respiratory variability,  suggesting right atrial pressure of 8 mmHg. Comparison(s): Prior images reviewed side by side. LVEF has decreased in comparison. Mitral valve regurgitation has also worsened. St. Jude prothesis in aortic position with moderate paravalvular regurgitation. FINDINGS  Left Ventricle: Left ventricular ejection fraction, by estimation, is 35 to 40%. The left ventricle has moderately decreased function. The left ventricle demonstrates global hypokinesis. The left ventricular internal cavity size was mildly dilated. There is mild left ventricular hypertrophy. Abnormal (paradoxical) septal motion, consistent with left bundle branch block. Left ventricular diastolic function could not be evaluated due to mitral annular calcification (moderate or greater). Left ventricular diastolic parameters are indeterminate. Right Ventricle: The right ventricular size is normal. No increase in right ventricular wall thickness. Right ventricular systolic function is mildly reduced. There is moderately elevated pulmonary artery systolic pressure. The tricuspid regurgitant velocity is 3.18 m/s, and with an assumed right atrial pressure of 8 mmHg, the estimated right ventricular systolic pressure is 48.4 mmHg. Left Atrium: Left atrial size was mildly dilated. Right Atrium: Right atrial size was normal in size. Pericardium: There is no evidence of pericardial effusion. Mitral Valve: The mitral valve is degenerative in appearance. There is moderate thickening of the mitral valve leaflet(s). There is mild calcification of the mitral valve leaflet(s). Moderate mitral annular calcification. Moderate to severe mitral valve regurgitation, with posteriorly-directed jet. Mild mitral valve stenosis. MV peak gradient, 10.1 mmHg. The mean mitral valve gradient is 4.0 mmHg. Tricuspid Valve: The tricuspid valve is grossly normal. Tricuspid valve regurgitation is mild to moderate. Aortic Valve: The aortic valve has been repaired/replaced. Aortic valve  regurgitation is moderate. Aortic regurgitation PHT measures 257 msec. Aortic valve mean gradient measures 17.0 mmHg. Aortic valve peak gradient measures 30.0 mmHg. There is a St. Jude valve present in the aortic position. Pulmonic Valve: The pulmonic valve was grossly normal. Pulmonic valve regurgitation is trivial. Aorta: The aortic root is normal in size and structure. Venous: The inferior vena cava is normal in size with less than 50% respiratory variability, suggesting right atrial pressure of 8 mmHg. IAS/Shunts: No atrial level shunt detected by color flow Doppler.  LEFT VENTRICLE PLAX 2D LVIDd:         5.70 cm  Diastology LVIDs:         4.70 cm      LV e' medial:    5.44 cm/s LV PW:         1.10 cm      LV E/e' medial:  26.7 LV IVS:        0.90 cm      LV e' lateral:   9.03 cm/s                             LV E/e' lateral: 16.1  LV Volumes (MOD) LV vol d, MOD A2C: 149.0 ml LV vol s, MOD A2C: 101.0 ml LV SV MOD A2C:     48.0 ml RIGHT VENTRICLE            IVC RV S prime:     9.57 cm/s  IVC diam: 2.10 cm TAPSE (M-mode): 1.4 cm LEFT ATRIUM           Index        RIGHT ATRIUM           Index LA diam:      3.70 cm 2.39 cm/m   RA Area:     13.30 cm LA Vol (A4C): 56.4 ml 36.44 ml/m  RA Volume:   28.20 ml  18.22 ml/m  AORTIC VALVE AV Vmax:           274.00 cm/s AV Vmean:          193.000 cm/s AV VTI:            0.560 m AV Peak Grad:      30.0 mmHg AV Mean Grad:      17.0 mmHg LVOT Vmax:         151.70 cm/s LVOT Vmean:        98.150 cm/s LVOT VTI:          0.294 m LVOT/AV VTI ratio: 0.53 AI PHT:            257 msec  AORTA Ao Asc diam: 3.00 cm MITRAL VALVE                TRICUSPID VALVE MV Area (PHT): 3.60 cm     TR Peak grad:   40.4 mmHg MV Peak grad:  10.1 mmHg    TR Vmax:        318.00 cm/s MV Mean grad:  4.0 mmHg MV Vmax:       1.59 m/s     SHUNTS MV Vmean:      92.0 cm/s    Systemic VTI: 0.29 m MV Decel Time: 211 msec MR Peak grad: 117.5 mmHg MR Mean grad: 66.0 mmHg MR Vmax:      542.00 cm/s MR Vmean:      384.0 cm/s MV E velocity: 145.00 cm/s MV A velocity: 115.00 cm/s MV E/A ratio:  1.26 Rozann Lesches MD Electronically signed by Rozann Lesches MD Signature Date/Time: 07/15/2022/5:04:37 PM    Final    DG CHEST PORT 1 VIEW  Result Date: 07/15/2022 CLINICAL DATA:  Respiratory failure EXAM: PORTABLE CHEST 1 VIEW COMPARISON:  Chest radiograph 1 day prior FINDINGS: Sternotomy wires are unchanged. Cardiomegaly is unchanged. There are dense mitral annular calcifications and extensive calcified atherosclerotic plaque throughout the thoracic aorta. There are bilateral pleural effusions with bibasilar opacities, worsened in the interim. There are coarsened interstitial markings throughout the lungs, likely chronic. There is possible mild pulmonary interstitial edema. There is  no pneumothorax. The bones are stable. IMPRESSION: 1. Bilateral pleural effusions with adjacent airspace opacities in the lung bases and possible mild pulmonary interstitial edema, worsened since the study from 1 day prior. Findings may reflect CHF. 2. Unchanged cardiomegaly with dense mitral annular calcifications. Electronically Signed   By: [name] M.D.   On: 07/15/2022 10:15   CT HEAD WO CONTRAST (5MM)  Result Date: 07/14/2022 CLINICAL DATA:  Altered mental status with change in pupil size since prior study. EXAM: CT HEAD WITHOUT CONTRAST TECHNIQUE: Contiguous axial images were obtained from the base of the skull through the vertex without intravenous contrast. RADIATION DOSE REDUCTION: This exam was performed according to the departmental dose-optimization program which includes automated exposure control, adjustment of the mA and/or kV according to patient size and/or use of iterative reconstruction technique. COMPARISON:  July 14, 2022 (11:12 a.m.) FINDINGS: Brain: There is moderate severity cerebral atrophy with widening of the extra-axial spaces and ventricular dilatation. There are areas of decreased attenuation within the white  matter tracts of the supratentorial brain, consistent with microvascular disease changes. Vascular: No hyperdense vessel or unexpected calcification. Skull: Normal. Negative for fracture or focal lesion. Sinuses/Orbits: No acute finding. Other: None. IMPRESSION: 1. No acute intracranial abnormality. 2. Generalized cerebral atrophy with chronic white matter small vessel ischemic changes. Electronically Signed   By: Virgina Norfolk M.D.   On: 07/14/2022 23:06   CT ANGIO HEAD NECK W WO CM  Result Date: 07/14/2022 CLINICAL DATA:  Neuro deficit, acute, stroke suspected EXAM: CT ANGIOGRAPHY HEAD AND NECK TECHNIQUE: Multidetector CT imaging of the head and neck was performed using the standard protocol during bolus administration of intravenous contrast. Multiplanar CT image reconstructions and MIPs were obtained to evaluate the vascular anatomy. Carotid stenosis measurements (when applicable) are obtained utilizing NASCET criteria, using the distal internal carotid diameter as the denominator. RADIATION DOSE REDUCTION: This exam was performed according to the departmental dose-optimization program which includes automated exposure control, adjustment of the mA and/or kV according to patient size and/or use of iterative reconstruction technique. CONTRAST:  59mL OMNIPAQUE IOHEXOL 350 MG/ML SOLN COMPARISON:  None Available. FINDINGS: Motion artifact is present. CTA NECK Aortic arch: Calcified plaque along the glued arch. Great vessel origins are patent. No high-grade proximal subclavian stenosis. Right carotid system: Patent. Mild multifocal primarily calcified plaque. There is motion artifact through the bifurcation precluding evaluation at this level. Left carotid system: Patent. Mild multifocal calcified plaque. No hemodynamically significant stenosis. Vertebral arteries: Patent. Left vertebral is dominant with dolichoectasia. No significant stenosis. Skeleton: Degenerative changes of the included spine. Other neck:  Unremarkable. Upper chest: Increased small left pleural effusion. Review of the MIP images confirms the above findings CTA HEAD Anterior circulation: Intracranial internal carotid arteries are patent. Calcified plaque is present causing up to moderate stenosis in the right proximal supraclinoid region. Mild stenosis is present on the left. Anterior and middle cerebral arteries are patent. Posterior circulation: Intracranial vertebral arteries are patent. Right vertebral terminates as a PICA. Basilar artery is patent. Major cerebellar artery origins are patent. Posterior cerebral arteries are patent. Venous sinuses: Patent as allowed by contrast bolus timing. Review of the MIP images confirms the above findings IMPRESSION: Motion artifact. No large vessel occlusion, hemodynamically significant stenosis, or evidence of dissection. The right ICA origin cannot be adequately evaluated due to motion degradation. Increased small left pleural effusion. Electronically Signed   By: Macy Mis M.D.   On: 07/14/2022 14:26   CT CHEST ABDOMEN PELVIS WO CONTRAST  Result Date: 07/14/2022 CLINICAL DATA:  Altered mental status.  Status post fall yesterday. EXAM: CT CHEST, ABDOMEN AND PELVIS WITHOUT CONTRAST TECHNIQUE: Multidetector CT imaging of the chest, abdomen and pelvis was performed following the standard protocol without IV contrast. Exam is limited due to persistent patient motion artifact. RADIATION DOSE REDUCTION: This exam was performed according to the departmental dose-optimization program which includes automated exposure control, adjustment of the mA and/or kV according to patient size and/or use of iterative reconstruction technique. COMPARISON:  September 08, 2020. FINDINGS: CT CHEST FINDINGS Cardiovascular: Status post aortic valve repair and coronary artery bypass graft. Atherosclerosis of thoracic aorta is noted without aneurysm formation. Mild cardiomegaly is noted. No pericardial effusion is noted.  Mediastinum/Nodes: No enlarged mediastinal, hilar, or axillary lymph nodes. Thyroid gland, trachea, and esophagus demonstrate no significant findings. Lungs/Pleura: No pneumothorax or pleural effusion is noted. Minimal bilateral posterior basilar subsegmental atelectasis is noted. Mild emphysematous disease is noted. Mild biapical scarring is noted. Musculoskeletal: No chest wall mass or suspicious bone lesions identified. CT ABDOMEN PELVIS FINDINGS Hepatobiliary: No focal liver abnormality is seen. No gallstones, gallbladder wall thickening, or biliary dilatation. Pancreas: Unremarkable. No pancreatic ductal dilatation or surrounding inflammatory changes. Spleen: Normal in size without focal abnormality. Adrenals/Urinary Tract: Adrenal glands are unremarkable. Kidneys are normal, without renal calculi, focal lesion, or hydronephrosis. Bladder is unremarkable. Stomach/Bowel: The stomach is unremarkable. There is no evidence of bowel obstruction or inflammation. Status post appendectomy. Sigmoid diverticulosis is noted without inflammation. Vascular/Lymphatic: 3.3 cm infrarenal abdominal aortic aneurysm is noted. No adenopathy is noted. Reproductive: Uterus and bilateral adnexa are unremarkable. Other: No abdominal wall hernia or abnormality. No abdominopelvic ascites. Musculoskeletal: No acute or significant osseous findings. IMPRESSION: Limited exam due to persistent patient motion artifact. Mild biapical scarring. Minimal bilateral posterior basilar subsegmental atelectasis. 3.3 cm infrarenal abdominal aortic aneurysm. Recommend follow-up ultrasound every 3 years. This recommendation follows ACR consensus guidelines: White Paper of the ACR Incidental Findings Committee II on Vascular Findings. J Am Coll Radiol 2013; 10:789-794. Sigmoid diverticulosis without inflammation. Aortic Atherosclerosis (ICD10-I70.0) and Emphysema (ICD10-J43.9). Electronically Signed   By: Marijo Conception M.D.   On: 07/14/2022 11:33   CT  HEAD WO CONTRAST  Result Date: 07/14/2022 CLINICAL DATA:  Provided history: Mental status change, unknown cause. EXAM: CT HEAD WITHOUT CONTRAST TECHNIQUE: Contiguous axial images were obtained from the base of the skull through the vertex without intravenous contrast. RADIATION DOSE REDUCTION: This exam was performed according to the departmental dose-optimization program which includes automated exposure control, adjustment of the mA and/or kV according to patient size and/or use of iterative reconstruction technique. COMPARISON:  Prior head CT examinations 10/25/2019 and earlier. FINDINGS: Mild-to-moderately motion degraded exam, limiting evaluation. Brain: Moderate generalized cerebral atrophy. Mild cerebellar atrophy. Mild-to-moderate patchy and ill-defined hypoattenuation within the cerebral white matter, nonspecific but compatible with chronic small vessel ischemic disease. There is no acute intracranial hemorrhage. No demarcated cortical infarct. No extra-axial fluid collection. No evidence of an intracranial mass. No midline shift. Vascular: No hyperdense vessel. Atherosclerotic calcifications. Skull: No fracture or aggressive osseous lesion. Sinuses/Orbits: No mass or acute finding within the imaged orbits. No significant paranasal sinus disease at the imaged levels. IMPRESSION: Motion degraded examination. No evidence of acute intracranial abnormality. Mild-to-moderate chronic small vessel image changes within the cerebral white matter. Moderate generalized cerebral atrophy. Mild cerebellar atrophy. Electronically Signed   By: Kellie Simmering D.O.   On: 07/14/2022 11:22   DG Chest Port 1 View  Result Date: 07/14/2022  CLINICAL DATA:  Questionable sepsis, altered mental status, pinpoint pupils EXAM: PORTABLE CHEST 1 VIEW COMPARISON:  09/15/2020 FINDINGS: Cardiomegaly status post median sternotomy and CABG. Diffuse bilateral interstitial opacity and probable small pleural effusions. No focal airspace  opacity. The visualized skeletal structures are unremarkable. IMPRESSION: Cardiomegaly with diffuse bilateral interstitial opacity and probable small pleural effusions, findings most consistent with pulmonary edema. No focal airspace opacity. Electronically Signed   By: Delanna Ahmadi M.D.   On: 07/14/2022 10:59      Subjective: Remains somnolent, overall appears to be resting from a respiratory standpoint  Discharge Exam: Vitals:   07/16/22 0930 07/16/22 0945 07/16/22 1000 07/16/22 1015  BP:      Pulse: 98 (!) 101 99 100  Resp: (!) 23 (!) 35 (!) 24 (!) 22  Temp:      TempSrc:      SpO2: 98% 98% 98% 97%  Weight:      Height:        General: Somnolent Cardiovascular: RRR, S1/S2 +, no rubs, no gallops Respiratory: CTA bilaterally, no wheezing, no rhonchi     The results of significant diagnostics from this hospitalization (including imaging, microbiology, ancillary and laboratory) are listed below for reference.     Microbiology: Recent Results (from the past 240 hour(s))  Resp Panel by RT-PCR (Flu A&B, Covid) Anterior Nasal Swab     Status: None   Collection Time: 07/14/22 10:40 AM   Specimen: Anterior Nasal Swab  Result Value Ref Range Status   SARS Coronavirus 2 by RT PCR NEGATIVE NEGATIVE Final    Comment: (NOTE) SARS-CoV-2 target nucleic acids are NOT DETECTED.  The SARS-CoV-2 RNA is generally detectable in upper respiratory specimens during the acute phase of infection. The lowest concentration of SARS-CoV-2 viral copies this assay can detect is 138 copies/mL. A negative result does not preclude SARS-Cov-2 infection and should not be used as the sole basis for treatment or other patient management decisions. A negative result may occur with  improper specimen collection/handling, submission of specimen other than nasopharyngeal swab, presence of viral mutation(s) within the areas targeted by this assay, and inadequate number of viral copies(<138 copies/mL). A  negative result must be combined with clinical observations, patient history, and epidemiological information. The expected result is Negative.  Fact Sheet for Patients:  EntrepreneurPulse.com.au  Fact Sheet for Healthcare Providers:  IncredibleEmployment.be  This test is no t yet approved or cleared by the [place] FDA and  has been authorized for detection and/or diagnosis of SARS-CoV-2 by FDA under an Emergency Use Authorization (EUA). This EUA will remain  in effect (meaning this test can be used) for the duration of the COVID-19 declaration under Section 564(b)(1) of the Act, 21 U.S.C.section 360bbb-3(b)(1), unless the authorization is terminated  or revoked sooner.       Influenza A by PCR NEGATIVE NEGATIVE Final   Influenza B by PCR NEGATIVE NEGATIVE Final    Comment: (NOTE) The Xpert Xpress SARS-CoV-2/FLU/RSV plus assay is intended as an aid in the diagnosis of influenza from Nasopharyngeal swab specimens and should not be used as a sole basis for treatment. Nasal washings and aspirates are unacceptable for Xpert Xpress SARS-CoV-2/FLU/RSV testing.  Fact Sheet for Patients: EntrepreneurPulse.com.au  Fact Sheet for Healthcare Providers: IncredibleEmployment.be  This test is not yet approved or cleared by the [place] FDA and has been authorized for detection and/or diagnosis of SARS-CoV-2 by FDA under an Emergency Use Authorization (EUA). This EUA will remain in effect (meaning this test  can be used) for the duration of the COVID-19 declaration under Section 564(b)(1) of the Act, 21 U.S.C. section 360bbb-3(b)(1), unless the authorization is terminated or revoked.  Performed at [facility], [address] [place], [place] [address]   Blood Culture (routine x 2)     Status: None (Preliminary result)   Collection Time: 07/14/22 11:04 AM   Specimen: BLOOD  Result Value Ref Range Status    Specimen Description BLOOD LEFT ANTECUBITAL  Final   Special Requests   Final    BOTTLES DRAWN AEROBIC AND ANAEROBIC Blood Culture adequate volume   Culture   Final    NO GROWTH 2 DAYS Performed at [facility], [address], [place], [place] [address]    Report Status PENDING  Incomplete  Blood Culture (routine x 2)     Status: None (Preliminary result)   Collection Time: 07/14/22 12:31 PM   Specimen: BLOOD  Result Value Ref Range Status   Specimen Description BLOOD LEFT ANTECUBITAL  Final   Special Requests   Final    BOTTLES DRAWN AEROBIC AND ANAEROBIC Blood Culture adequate volume   Culture   Final    NO GROWTH 2 DAYS Performed at [facility], [address], [place], [place] [address]    Report Status PENDING  Incomplete  MRSA Next Gen by PCR, Nasal     Status: None   Collection Time: 07/14/22  4:06 PM   Specimen: Nasal Mucosa; Nasal Swab  Result Value Ref Range Status   MRSA by PCR Next Gen NOT DETECTED NOT DETECTED Final    Comment: (NOTE) The GeneXpert MRSA Assay (FDA approved for NASAL specimens only), is one component of a comprehensive MRSA colonization surveillance program. It is not intended to diagnose MRSA infection nor to guide or monitor treatment for MRSA infections. Test performance is not FDA approved in patients less than 18 years old. Performed at [facility], [address] [place], [place] [address]      Labs: BNP (last 3 results) Recent Labs    07/14/22 1746 07/15/22 0430  BNP 825.0* 123456*   Basic Metabolic Panel: Recent Labs  Lab 07/14/22 1040 07/14/22 1110 07/15/22 0430  NA 143 142 143  K 4.4 4.1 4.4  CL 108 110 110  CO2 27  --  25  GLUCOSE 112* 115* 172*  BUN 30* 32* 34*  CREATININE 1.54* 1.70* 1.73*  CALCIUM 9.2  --  8.7*   Liver Function Tests: Recent Labs  Lab 07/14/22 1040 07/15/22 0430  AST 28 28  ALT 21 22  ALKPHOS 81 67  BILITOT 1.0 0.8  PROT 7.5 6.1*  ALBUMIN 4.0 3.2*   No results for input(s): "LIPASE",  "AMYLASE" in the last 168 hours. Recent Labs  Lab 07/15/22 1033  AMMONIA 22   CBC: Recent Labs  Lab 07/14/22 1040 07/14/22 1110 07/15/22 0430  WBC 10.6*  --  9.4  NEUTROABS 8.0*  --   --   HGB 9.8* 9.9* 9.0*  HCT 31.8* 29.0* 28.6*  MCV 98.5  --  97.3  PLT 171  --  158   Cardiac Enzymes: No results for input(s): "CKTOTAL", "CKMB", "CKMBINDEX", "TROPONINI" in the last 168 hours. BNP: Invalid input(s): "POCBNP" CBG: Recent Labs  Lab 07/14/22 1028 07/14/22 1619  GLUCAP 100* 149*   D-Dimer Recent Labs    07/14/22 1040  DDIMER 2.87*   Hgb A1c No results for input(s): "HGBA1C" in the last 72 hours. Lipid Profile No results for input(s): "CHOL", "HDL", "LDLCALC", "  TRIG", "CHOLHDL", "LDLDIRECT" in the last 72 hours. Thyroid function studies Recent Labs    07/14/22 1040  TSH 2.535   Anemia work up No results for input(s): "VITAMINB12", "FOLATE", "FERRITIN", "TIBC", "IRON", "RETICCTPCT" in the last 72 hours. Urinalysis    Component Value Date/Time   COLORURINE YELLOW 07/14/2022 1040   APPEARANCEUR HAZY (A) 07/14/2022 1040   LABSPEC 1.020 07/14/2022 1040   PHURINE 5.0 07/14/2022 1040   GLUCOSEU NEGATIVE 07/14/2022 1040   GLUCOSEU NEGATIVE 04/10/2008 1246   HGBUR NEGATIVE 07/14/2022 1040   BILIRUBINUR NEGATIVE 07/14/2022 1040   KETONESUR NEGATIVE 07/14/2022 1040   PROTEINUR 100 (A) 07/14/2022 1040   UROBILINOGEN 0.2 10/20/2019 1404   NITRITE NEGATIVE 07/14/2022 1040   LEUKOCYTESUR NEGATIVE 07/14/2022 1040   Sepsis Labs Recent Labs  Lab 07/14/22 1040 07/15/22 0430  WBC 10.6* 9.4   Microbiology Recent Results (from the past 240 hour(s))  Resp Panel by RT-PCR (Flu A&B, Covid) Anterior Nasal Swab     Status: None   Collection Time: 07/14/22 10:40 AM   Specimen: Anterior Nasal Swab  Result Value Ref Range Status   SARS Coronavirus 2 by RT PCR NEGATIVE NEGATIVE Final    Comment: (NOTE) SARS-CoV-2 target nucleic acids are NOT DETECTED.  The SARS-CoV-2 RNA  is generally detectable in upper respiratory specimens during the acute phase of infection. The lowest concentration of SARS-CoV-2 viral copies this assay can detect is 138 copies/mL. A negative result does not preclude SARS-Cov-2 infection and should not be used as the sole basis for treatment or other patient management decisions. A negative result may occur with  improper specimen collection/handling, submission of specimen other than nasopharyngeal swab, presence of viral mutation(s) within the areas targeted by this assay, and inadequate number of viral copies(<138 copies/mL). A negative result must be combined with clinical observations, patient history, and epidemiological information. The expected result is Negative.  Fact Sheet for Patients:  EntrepreneurPulse.com.au  Fact Sheet for Healthcare Providers:  IncredibleEmployment.be  This test is no t yet approved or cleared by the [place] FDA and  has been authorized for detection and/or diagnosis of SARS-CoV-2 by FDA under an Emergency Use Authorization (EUA). This EUA will remain  in effect (meaning this test can be used) for the duration of the COVID-19 declaration under Section 564(b)(1) of the Act, 21 U.S.C.section 360bbb-3(b)(1), unless the authorization is terminated  or revoked sooner.       Influenza A by PCR NEGATIVE NEGATIVE Final   Influenza B by PCR NEGATIVE NEGATIVE Final    Comment: (NOTE) The Xpert Xpress SARS-CoV-2/FLU/RSV plus assay is intended as an aid in the diagnosis of influenza from Nasopharyngeal swab specimens and should not be used as a sole basis for treatment. Nasal washings and aspirates are unacceptable for Xpert Xpress SARS-CoV-2/FLU/RSV testing.  Fact Sheet for Patients: EntrepreneurPulse.com.au  Fact Sheet for Healthcare Providers: IncredibleEmployment.be  This test is not yet approved or cleared by the  [place] FDA and has been authorized for detection and/or diagnosis of SARS-CoV-2 by FDA under an Emergency Use Authorization (EUA). This EUA will remain in effect (meaning this test can be used) for the duration of the COVID-19 declaration under Section 564(b)(1) of the Act, 21 U.S.C. section 360bbb-3(b)(1), unless the authorization is terminated or revoked.  Performed at [facility], [address] [place],  [address]   Blood Culture (routine x 2)     Status: None (Preliminary result)   Collection Time: 07/14/22 11:04 AM   Specimen: BLOOD  Result Value Ref Range Status   Specimen Description BLOOD LEFT ANTECUBITAL  Final   Special Requests   Final    BOTTLES DRAWN AEROBIC AND ANAEROBIC Blood Culture adequate volume   Culture   Final    NO GROWTH 2 DAYS Performed at [facility], [address] [place], [place] [address]    Report Status PENDING  Incomplete  Blood Culture (routine x 2)     Status: None (Preliminary result)   Collection Time: 07/14/22 12:31 PM   Specimen: BLOOD  Result Value Ref Range Status   Specimen Description BLOOD LEFT ANTECUBITAL  Final   Special Requests   Final    BOTTLES DRAWN AEROBIC AND ANAEROBIC Blood Culture adequate volume   Culture   Final    NO GROWTH 2 DAYS Performed at [facility], [address], [place], [place] [address]    Report Status PENDING  Incomplete  MRSA Next Gen by PCR, Nasal     Status: None   Collection Time: 07/14/22  4:06 PM   Specimen: Nasal Mucosa; Nasal Swab  Result Value Ref Range Status   MRSA by PCR Next Gen NOT DETECTED NOT DETECTED Final    Comment: (NOTE) The GeneXpert MRSA Assay (FDA approved for NASAL specimens only), is one component of a comprehensive MRSA colonization surveillance program. It is not intended to diagnose MRSA infection nor to guide or monitor treatment for MRSA infections. Test performance is not FDA approved in patients less than 73 years old. Performed at [facility],  [address] [place], [place] [address]      Time coordinating discharge: 58mins  SIGNED:   Kathie Dike, MD  [facility] 07/16/2022, 7:57 PM   If 7PM-7AM, please contact night-coverage [email or web address]

## 2022-07-20 ENCOUNTER — Encounter: Payer: Self-pay | Admitting: *Deleted

## 2022-07-20 LAB — CULTURE, BLOOD (ROUTINE X 2)
Culture: NO GROWTH
Culture: NO GROWTH
Special Requests: ADEQUATE
Special Requests: ADEQUATE

## 2022-07-21 ENCOUNTER — Telehealth: Payer: Self-pay | Admitting: Internal Medicine

## 2022-07-21 NOTE — Telephone Encounter (Signed)
This is an FYI to the provider.  °

## 2022-07-21 NOTE — Telephone Encounter (Signed)
Send condolence card please

## 2022-07-21 NOTE — Telephone Encounter (Signed)
Placed in your lookat to be signed

## 2022-07-22 ENCOUNTER — Telehealth: Payer: Self-pay | Admitting: Orthopaedic Surgery

## 2022-07-22 NOTE — Telephone Encounter (Signed)
Patient's daughter called to relay that patient has passed away. Thanks Dr Hilda Lias for everything he has done.

## 2022-07-29 ENCOUNTER — Encounter (INDEPENDENT_AMBULATORY_CARE_PROVIDER_SITE_OTHER): Payer: Medicare Other | Admitting: Ophthalmology

## 2022-08-05 ENCOUNTER — Other Ambulatory Visit: Payer: Medicare Other | Admitting: Nurse Practitioner

## 2022-08-15 DEATH — deceased

## 2022-08-23 NOTE — Telephone Encounter (Signed)
Card was mailed to patient last month. Will close this encounter.

## 2022-10-12 ENCOUNTER — Ambulatory Visit: Payer: Medicare Other | Admitting: Orthopaedic Surgery

## 2022-10-15 ENCOUNTER — Ambulatory Visit: Payer: Medicare Other | Admitting: Cardiology
